# Patient Record
Sex: Female | Born: 1935
Health system: Southern US, Community
[De-identification: ages and names within clinical notes are randomized; demographics above are authoritative.]

## PROBLEM LIST (undated history)

## (undated) DIAGNOSIS — R0989 Other specified symptoms and signs involving the circulatory and respiratory systems: Secondary | ICD-10-CM

## (undated) DIAGNOSIS — R7302 Impaired glucose tolerance (oral): Secondary | ICD-10-CM

## (undated) DIAGNOSIS — H9190 Unspecified hearing loss, unspecified ear: Secondary | ICD-10-CM

## (undated) DIAGNOSIS — R413 Other amnesia: Secondary | ICD-10-CM

## (undated) DIAGNOSIS — J310 Chronic rhinitis: Secondary | ICD-10-CM

## (undated) DIAGNOSIS — D649 Anemia, unspecified: Secondary | ICD-10-CM

## (undated) DIAGNOSIS — K08109 Complete loss of teeth, unspecified cause, unspecified class: Secondary | ICD-10-CM

## (undated) DIAGNOSIS — J309 Allergic rhinitis, unspecified: Secondary | ICD-10-CM

## (undated) DIAGNOSIS — R42 Dizziness and giddiness: Secondary | ICD-10-CM

## (undated) DIAGNOSIS — N289 Disorder of kidney and ureter, unspecified: Secondary | ICD-10-CM

## (undated) DIAGNOSIS — K219 Gastro-esophageal reflux disease without esophagitis: Secondary | ICD-10-CM

## (undated) DIAGNOSIS — Z973 Presence of spectacles and contact lenses: Secondary | ICD-10-CM

## (undated) DIAGNOSIS — I10 Essential (primary) hypertension: Secondary | ICD-10-CM

## (undated) DIAGNOSIS — R0600 Dyspnea, unspecified: Secondary | ICD-10-CM

## (undated) DIAGNOSIS — N189 Chronic kidney disease, unspecified: Secondary | ICD-10-CM

## (undated) DIAGNOSIS — E871 Hypo-osmolality and hyponatremia: Secondary | ICD-10-CM

## (undated) DIAGNOSIS — C50412 Malignant neoplasm of upper-outer quadrant of left female breast: Secondary | ICD-10-CM

## (undated) DIAGNOSIS — Z972 Presence of dental prosthetic device (complete) (partial): Secondary | ICD-10-CM

## (undated) DIAGNOSIS — K635 Polyp of colon: Secondary | ICD-10-CM

## (undated) DIAGNOSIS — I5189 Other ill-defined heart diseases: Secondary | ICD-10-CM

## (undated) DIAGNOSIS — M1990 Unspecified osteoarthritis, unspecified site: Secondary | ICD-10-CM

## (undated) DIAGNOSIS — J45909 Unspecified asthma, uncomplicated: Secondary | ICD-10-CM

## (undated) DIAGNOSIS — M7500 Adhesive capsulitis of unspecified shoulder: Secondary | ICD-10-CM

## (undated) HISTORY — DX: Disorder of kidney and ureter, unspecified: N28.9

## (undated) HISTORY — DX: Polyp of colon: K63.5

## (undated) HISTORY — PX: EYE SURGERY: SHX253

## (undated) HISTORY — DX: Malignant neoplasm of upper-outer quadrant of left female breast: C50.412

## (undated) HISTORY — PX: ABDOMINAL HYSTERECTOMY: SHX81

## (undated) HISTORY — DX: Anemia, unspecified: D64.9

## (undated) HISTORY — DX: Chronic rhinitis: J31.0

## (undated) HISTORY — DX: Unspecified osteoarthritis, unspecified site: M19.90

## (undated) HISTORY — DX: Adhesive capsulitis of unspecified shoulder: M75.00

## (undated) HISTORY — DX: Essential (primary) hypertension: I10

## (undated) HISTORY — DX: Chronic kidney disease, unspecified: N18.9

## (undated) HISTORY — DX: Other ill-defined heart diseases: I51.89

## (undated) HISTORY — PX: TONSILLECTOMY: SUR1361

## (undated) HISTORY — PX: COLONOSCOPY: SHX174

## (undated) HISTORY — DX: Allergic rhinitis, unspecified: J30.9

## (undated) HISTORY — DX: Dizziness and giddiness: R42

## (undated) HISTORY — DX: Morbid (severe) obesity due to excess calories: E66.01

## (undated) HISTORY — PX: KNEE ARTHROSCOPY: SUR90

## (undated) HISTORY — DX: Other amnesia: R41.3

## (undated) HISTORY — DX: Impaired glucose tolerance (oral): R73.02

---

## 1978-10-26 HISTORY — PX: VESICOVAGINAL FISTULA CLOSURE W/ TAH: SUR271

## 2000-03-31 ENCOUNTER — Encounter: Admission: RE | Admit: 2000-03-31 | Discharge: 2000-03-31 | Payer: Self-pay | Admitting: Obstetrics and Gynecology

## 2000-03-31 ENCOUNTER — Encounter: Payer: Self-pay | Admitting: Obstetrics and Gynecology

## 2000-04-07 ENCOUNTER — Other Ambulatory Visit: Admission: RE | Admit: 2000-04-07 | Discharge: 2000-04-07 | Payer: Self-pay | Admitting: Obstetrics and Gynecology

## 2000-10-26 HISTORY — PX: TOTAL KNEE ARTHROPLASTY: SHX125

## 2000-11-16 ENCOUNTER — Encounter: Payer: Self-pay | Admitting: Orthopedic Surgery

## 2000-11-19 ENCOUNTER — Inpatient Hospital Stay (HOSPITAL_COMMUNITY): Admission: RE | Admit: 2000-11-19 | Discharge: 2000-11-24 | Payer: Self-pay | Admitting: Orthopedic Surgery

## 2000-11-19 ENCOUNTER — Encounter: Payer: Self-pay | Admitting: Orthopedic Surgery

## 2000-11-22 ENCOUNTER — Encounter: Payer: Self-pay | Admitting: Orthopedic Surgery

## 2001-04-05 ENCOUNTER — Encounter: Admission: RE | Admit: 2001-04-05 | Discharge: 2001-04-05 | Payer: Self-pay | Admitting: Obstetrics and Gynecology

## 2001-04-05 ENCOUNTER — Encounter: Payer: Self-pay | Admitting: Obstetrics and Gynecology

## 2001-04-14 ENCOUNTER — Other Ambulatory Visit: Admission: RE | Admit: 2001-04-14 | Discharge: 2001-04-14 | Payer: Self-pay | Admitting: Obstetrics and Gynecology

## 2001-10-26 HISTORY — PX: TOTAL KNEE ARTHROPLASTY: SHX125

## 2002-03-10 ENCOUNTER — Encounter: Payer: Self-pay | Admitting: Obstetrics and Gynecology

## 2002-03-10 ENCOUNTER — Encounter: Admission: RE | Admit: 2002-03-10 | Discharge: 2002-03-10 | Payer: Self-pay | Admitting: Obstetrics and Gynecology

## 2002-05-24 ENCOUNTER — Inpatient Hospital Stay (HOSPITAL_COMMUNITY): Admission: RE | Admit: 2002-05-24 | Discharge: 2002-05-28 | Payer: Self-pay | Admitting: Orthopedic Surgery

## 2002-05-24 ENCOUNTER — Encounter: Payer: Self-pay | Admitting: Anesthesiology

## 2003-04-04 ENCOUNTER — Encounter: Payer: Self-pay | Admitting: Obstetrics and Gynecology

## 2003-04-04 ENCOUNTER — Encounter: Admission: RE | Admit: 2003-04-04 | Discharge: 2003-04-04 | Payer: Self-pay | Admitting: Obstetrics and Gynecology

## 2003-05-01 ENCOUNTER — Other Ambulatory Visit: Admission: RE | Admit: 2003-05-01 | Discharge: 2003-05-01 | Payer: Self-pay | Admitting: Obstetrics and Gynecology

## 2004-04-14 ENCOUNTER — Encounter: Admission: RE | Admit: 2004-04-14 | Discharge: 2004-04-14 | Payer: Self-pay | Admitting: Obstetrics and Gynecology

## 2004-09-11 ENCOUNTER — Ambulatory Visit: Payer: Self-pay | Admitting: Pulmonary Disease

## 2005-01-30 ENCOUNTER — Ambulatory Visit: Payer: Self-pay | Admitting: Internal Medicine

## 2005-03-31 ENCOUNTER — Ambulatory Visit: Payer: Self-pay | Admitting: Internal Medicine

## 2005-04-08 ENCOUNTER — Encounter (INDEPENDENT_AMBULATORY_CARE_PROVIDER_SITE_OTHER): Payer: Self-pay | Admitting: *Deleted

## 2005-04-08 ENCOUNTER — Ambulatory Visit: Payer: Self-pay | Admitting: Internal Medicine

## 2005-04-17 ENCOUNTER — Encounter: Admission: RE | Admit: 2005-04-17 | Discharge: 2005-04-17 | Payer: Self-pay | Admitting: Obstetrics and Gynecology

## 2005-05-29 ENCOUNTER — Ambulatory Visit: Payer: Self-pay | Admitting: Internal Medicine

## 2005-08-12 ENCOUNTER — Ambulatory Visit: Payer: Self-pay | Admitting: Internal Medicine

## 2005-09-29 ENCOUNTER — Encounter: Admission: RE | Admit: 2005-09-29 | Discharge: 2005-09-29 | Payer: Self-pay | Admitting: Occupational Medicine

## 2005-11-11 ENCOUNTER — Ambulatory Visit: Payer: Self-pay | Admitting: Pulmonary Disease

## 2005-12-22 ENCOUNTER — Ambulatory Visit: Payer: Self-pay | Admitting: Internal Medicine

## 2006-04-19 ENCOUNTER — Encounter: Admission: RE | Admit: 2006-04-19 | Discharge: 2006-04-19 | Payer: Self-pay | Admitting: Obstetrics and Gynecology

## 2006-04-23 ENCOUNTER — Ambulatory Visit: Payer: Self-pay | Admitting: Internal Medicine

## 2006-09-01 ENCOUNTER — Ambulatory Visit: Payer: Self-pay | Admitting: Internal Medicine

## 2006-10-13 ENCOUNTER — Ambulatory Visit: Payer: Self-pay | Admitting: Internal Medicine

## 2007-02-15 ENCOUNTER — Ambulatory Visit: Payer: Self-pay | Admitting: Internal Medicine

## 2007-04-22 ENCOUNTER — Encounter: Admission: RE | Admit: 2007-04-22 | Discharge: 2007-04-22 | Payer: Self-pay | Admitting: Internal Medicine

## 2007-05-16 ENCOUNTER — Ambulatory Visit: Payer: Self-pay | Admitting: Internal Medicine

## 2007-09-02 ENCOUNTER — Ambulatory Visit: Payer: Self-pay | Admitting: Internal Medicine

## 2007-09-02 LAB — CONVERTED CEMR LAB
BUN: 26 mg/dL — ABNORMAL HIGH (ref 6–23)
Basophils Absolute: 0 10*3/uL (ref 0.0–0.1)
Basophils Relative: 1 % (ref 0.0–1.0)
Bilirubin Urine: NEGATIVE
CO2: 31 meq/L (ref 19–32)
Calcium: 9.8 mg/dL (ref 8.4–10.5)
Chloride: 104 meq/L (ref 96–112)
Cholesterol: 164 mg/dL (ref 0–200)
Creatinine, Ser: 1.2 mg/dL (ref 0.4–1.2)
Crystals: NEGATIVE
Eosinophils Absolute: 0.2 10*3/uL (ref 0.0–0.6)
Eosinophils Relative: 3.7 % (ref 0.0–5.0)
GFR calc Af Amer: 57 mL/min
GFR calc non Af Amer: 47 mL/min
Glucose, Bld: 96 mg/dL (ref 70–99)
HCT: 34.7 % — ABNORMAL LOW (ref 36.0–46.0)
HDL: 50.8 mg/dL (ref >39.0)
Hemoglobin, Urine: NEGATIVE
Hemoglobin: 11.7 g/dL — ABNORMAL LOW (ref 12.0–15.0)
Ketones, ur: NEGATIVE mg/dL
LDL Cholesterol: 101 mg/dL — ABNORMAL HIGH (ref 0–99)
Lymphocytes Relative: 28.6 % (ref 12.0–46.0)
MCHC: 33.7 g/dL (ref 30.0–36.0)
MCV: 94.4 fL (ref 78.0–100.0)
Monocytes Absolute: 0.5 10*3/uL (ref 0.2–0.7)
Monocytes Relative: 9.9 % (ref 3.0–11.0)
Mucus, UA: NEGATIVE
Neutro Abs: 2.8 10*3/uL (ref 1.4–7.7)
Neutrophils Relative %: 56.8 % (ref 43.0–77.0)
Nitrite: NEGATIVE
Platelets: 269 10*3/uL (ref 150–400)
Potassium: 4.3 meq/L (ref 3.5–5.1)
RBC / HPF: NONE SEEN
RBC: 3.68 M/uL — ABNORMAL LOW (ref 3.87–5.11)
RDW: 12.5 % (ref 11.5–14.6)
Sodium: 142 meq/L (ref 135–145)
Specific Gravity, Urine: 1.01 (ref 1.000–1.03)
TSH: 1.91 microintl units/mL (ref 0.35–5.50)
Total CHOL/HDL Ratio: 3.2
Total Protein, Urine: NEGATIVE mg/dL
Triglycerides: 62 mg/dL (ref 0–149)
Urine Glucose: NEGATIVE mg/dL
Urobilinogen, UA: 0.2 (ref 0.0–1.0)
VLDL: 12 mg/dL (ref 0–40)
Vit D, 1,25-Dihydroxy: 22 — ABNORMAL LOW (ref 30–89)
WBC: 4.9 10*3/uL (ref 4.5–10.5)
pH: 7.5 (ref 5.0–8.0)

## 2007-09-14 ENCOUNTER — Encounter: Payer: Self-pay | Admitting: Internal Medicine

## 2007-10-27 HISTORY — PX: CATARACT EXTRACTION: SUR2

## 2007-12-16 DIAGNOSIS — I10 Essential (primary) hypertension: Secondary | ICD-10-CM | POA: Insufficient documentation

## 2007-12-16 DIAGNOSIS — J310 Chronic rhinitis: Secondary | ICD-10-CM | POA: Insufficient documentation

## 2007-12-16 DIAGNOSIS — K625 Hemorrhage of anus and rectum: Secondary | ICD-10-CM | POA: Insufficient documentation

## 2007-12-16 DIAGNOSIS — M1990 Unspecified osteoarthritis, unspecified site: Secondary | ICD-10-CM | POA: Insufficient documentation

## 2007-12-19 ENCOUNTER — Ambulatory Visit: Payer: Self-pay | Admitting: Internal Medicine

## 2007-12-19 DIAGNOSIS — E559 Vitamin D deficiency, unspecified: Secondary | ICD-10-CM | POA: Insufficient documentation

## 2007-12-21 LAB — CONVERTED CEMR LAB: Vit D, 1,25-Dihydroxy: 39 (ref 30–89)

## 2008-04-23 ENCOUNTER — Encounter: Admission: RE | Admit: 2008-04-23 | Discharge: 2008-04-23 | Payer: Self-pay | Admitting: Obstetrics and Gynecology

## 2008-06-18 ENCOUNTER — Telehealth (INDEPENDENT_AMBULATORY_CARE_PROVIDER_SITE_OTHER): Payer: Self-pay | Admitting: *Deleted

## 2008-07-05 ENCOUNTER — Ambulatory Visit: Payer: Self-pay | Admitting: Internal Medicine

## 2008-07-05 DIAGNOSIS — K219 Gastro-esophageal reflux disease without esophagitis: Secondary | ICD-10-CM | POA: Insufficient documentation

## 2008-07-25 ENCOUNTER — Ambulatory Visit: Payer: Self-pay | Admitting: Internal Medicine

## 2008-10-30 ENCOUNTER — Ambulatory Visit: Payer: Self-pay | Admitting: Internal Medicine

## 2008-10-30 DIAGNOSIS — R609 Edema, unspecified: Secondary | ICD-10-CM | POA: Insufficient documentation

## 2008-10-30 LAB — CONVERTED CEMR LAB
ALT: 24 units/L (ref 0–35)
AST: 25 units/L (ref 0–37)
Albumin: 3.8 g/dL (ref 3.5–5.2)
Alkaline Phosphatase: 64 units/L (ref 39–117)
BUN: 26 mg/dL — ABNORMAL HIGH (ref 6–23)
Basophils Absolute: 0.1 10*3/uL (ref 0.0–0.1)
Basophils Relative: 1.1 % (ref 0.0–3.0)
Bilirubin Urine: NEGATIVE
Bilirubin, Direct: 0.1 mg/dL (ref 0.0–0.3)
CO2: 30 meq/L (ref 19–32)
Calcium: 9.5 mg/dL (ref 8.4–10.5)
Chloride: 107 meq/L (ref 96–112)
Cholesterol: 177 mg/dL (ref 0–200)
Creatinine, Ser: 1.4 mg/dL — ABNORMAL HIGH (ref 0.4–1.2)
Crystals: NEGATIVE
Eosinophils Absolute: 0.3 10*3/uL (ref 0.0–0.7)
Eosinophils Relative: 5 % (ref 0.0–5.0)
GFR calc Af Amer: 48 mL/min
GFR calc non Af Amer: 40 mL/min
Glucose, Bld: 101 mg/dL — ABNORMAL HIGH (ref 70–99)
HCT: 34.5 % — ABNORMAL LOW (ref 36.0–46.0)
HDL: 55.8 mg/dL (ref >39.0)
Hemoglobin, Urine: NEGATIVE
Hemoglobin: 11.9 g/dL — ABNORMAL LOW (ref 12.0–15.0)
Ketones, ur: NEGATIVE mg/dL
LDL Cholesterol: 107 mg/dL — ABNORMAL HIGH (ref 0–99)
Lymphocytes Relative: 32.4 % (ref 12.0–46.0)
MCHC: 34.6 g/dL (ref 30.0–36.0)
MCV: 96.2 fL (ref 78.0–100.0)
Monocytes Absolute: 0.5 10*3/uL (ref 0.1–1.0)
Monocytes Relative: 8.6 % (ref 3.0–12.0)
Mucus, UA: NEGATIVE
Neutro Abs: 2.8 10*3/uL (ref 1.4–7.7)
Neutrophils Relative %: 52.9 % (ref 43.0–77.0)
Nitrite: NEGATIVE
Platelets: 242 10*3/uL (ref 150–400)
Potassium: 4.1 meq/L (ref 3.5–5.1)
RBC / HPF: NONE SEEN
RBC: 3.59 M/uL — ABNORMAL LOW (ref 3.87–5.11)
RDW: 12.6 % (ref 11.5–14.6)
Sodium: 142 meq/L (ref 135–145)
Specific Gravity, Urine: <=1.005 (ref 1.000–1.03)
TSH: 2.92 microintl units/mL (ref 0.35–5.50)
Total Bilirubin: 0.9 mg/dL (ref 0.3–1.2)
Total CHOL/HDL Ratio: 3.2
Total Protein, Urine: NEGATIVE mg/dL
Total Protein: 7.2 g/dL (ref 6.0–8.3)
Triglycerides: 71 mg/dL (ref 0–149)
Urine Glucose: NEGATIVE mg/dL
Urobilinogen, UA: 0.2 (ref 0.0–1.0)
VLDL: 14 mg/dL (ref 0–40)
WBC: 5.4 10*3/uL (ref 4.5–10.5)
pH: 6 (ref 5.0–8.0)

## 2008-10-31 LAB — CONVERTED CEMR LAB: Vit D, 1,25-Dihydroxy: 52 (ref 30–89)

## 2008-11-09 ENCOUNTER — Telehealth (INDEPENDENT_AMBULATORY_CARE_PROVIDER_SITE_OTHER): Payer: Self-pay | Admitting: *Deleted

## 2008-12-28 ENCOUNTER — Telehealth: Payer: Self-pay | Admitting: Internal Medicine

## 2009-01-01 ENCOUNTER — Encounter: Payer: Self-pay | Admitting: Adult Health

## 2009-01-01 ENCOUNTER — Ambulatory Visit: Payer: Self-pay | Admitting: Internal Medicine

## 2009-01-01 DIAGNOSIS — M549 Dorsalgia, unspecified: Secondary | ICD-10-CM | POA: Insufficient documentation

## 2009-01-03 LAB — CONVERTED CEMR LAB
Bacteria, UA: NEGATIVE
Bilirubin Urine: NEGATIVE
Crystals: NEGATIVE
Hemoglobin, Urine: NEGATIVE
Ketones, ur: NEGATIVE mg/dL
Mucus, UA: NEGATIVE
Nitrite: NEGATIVE
RBC / HPF: NONE SEEN
Sed Rate: 25 mm/hr — ABNORMAL HIGH (ref 0–22)
Specific Gravity, Urine: 1.01 (ref 1.000–1.035)
Total Protein, Urine: NEGATIVE mg/dL
Urine Glucose: NEGATIVE mg/dL
Urobilinogen, UA: 0.2 (ref 0.0–1.0)
pH: 6.5 (ref 5.0–8.0)

## 2009-01-15 ENCOUNTER — Ambulatory Visit: Payer: Self-pay | Admitting: Internal Medicine

## 2009-05-06 ENCOUNTER — Encounter: Admission: RE | Admit: 2009-05-06 | Discharge: 2009-05-06 | Payer: Self-pay | Admitting: Internal Medicine

## 2009-06-17 ENCOUNTER — Telehealth: Payer: Self-pay | Admitting: Internal Medicine

## 2009-07-08 ENCOUNTER — Telehealth (INDEPENDENT_AMBULATORY_CARE_PROVIDER_SITE_OTHER): Payer: Self-pay | Admitting: *Deleted

## 2009-08-14 ENCOUNTER — Ambulatory Visit: Payer: Self-pay | Admitting: Internal Medicine

## 2009-08-28 ENCOUNTER — Telehealth: Payer: Self-pay | Admitting: Internal Medicine

## 2009-08-29 ENCOUNTER — Ambulatory Visit: Payer: Self-pay | Admitting: Internal Medicine

## 2009-09-30 ENCOUNTER — Ambulatory Visit: Payer: Self-pay | Admitting: Internal Medicine

## 2009-11-18 ENCOUNTER — Ambulatory Visit: Payer: Self-pay | Admitting: Internal Medicine

## 2010-02-12 ENCOUNTER — Ambulatory Visit: Payer: Self-pay | Admitting: Internal Medicine

## 2010-02-12 DIAGNOSIS — N259 Disorder resulting from impaired renal tubular function, unspecified: Secondary | ICD-10-CM | POA: Insufficient documentation

## 2010-02-13 LAB — CONVERTED CEMR LAB: Vit D, 25-Hydroxy: 67 ng/mL (ref 30–89)

## 2010-02-15 LAB — CONVERTED CEMR LAB
ALT: 26 units/L (ref 0–35)
AST: 27 units/L (ref 0–37)
Albumin: 4 g/dL (ref 3.5–5.2)
Alkaline Phosphatase: 54 units/L (ref 39–117)
BUN: 28 mg/dL — ABNORMAL HIGH (ref 6–23)
Basophils Absolute: 0.1 10*3/uL (ref 0.0–0.1)
Basophils Relative: 0.9 % (ref 0.0–3.0)
Bilirubin Urine: NEGATIVE
Bilirubin, Direct: 0.1 mg/dL (ref 0.0–0.3)
CO2: 31 meq/L (ref 19–32)
Calcium: 9.7 mg/dL (ref 8.4–10.5)
Chloride: 106 meq/L (ref 96–112)
Cholesterol: 173 mg/dL (ref 0–200)
Creatinine, Ser: 1.5 mg/dL — ABNORMAL HIGH (ref 0.4–1.2)
Eosinophils Absolute: 0.3 10*3/uL (ref 0.0–0.7)
Eosinophils Relative: 4.6 % (ref 0.0–5.0)
GFR calc non Af Amer: 43.86 mL/min (ref >60)
Glucose, Bld: 95 mg/dL (ref 70–99)
HCT: 34.9 % — ABNORMAL LOW (ref 36.0–46.0)
HDL: 50.3 mg/dL (ref >39.00)
Hemoglobin, Urine: NEGATIVE
Hemoglobin: 11.8 g/dL — ABNORMAL LOW (ref 12.0–15.0)
Ketones, ur: NEGATIVE mg/dL
LDL Cholesterol: 106 mg/dL — ABNORMAL HIGH (ref 0–99)
Lymphocytes Relative: 25.8 % (ref 12.0–46.0)
Lymphs Abs: 1.5 10*3/uL (ref 0.7–4.0)
MCHC: 33.8 g/dL (ref 30.0–36.0)
MCV: 96.3 fL (ref 78.0–100.0)
Monocytes Absolute: 0.5 10*3/uL (ref 0.1–1.0)
Monocytes Relative: 8.2 % (ref 3.0–12.0)
Neutro Abs: 3.5 10*3/uL (ref 1.4–7.7)
Neutrophils Relative %: 60.5 % (ref 43.0–77.0)
Nitrite: NEGATIVE
Platelets: 259 10*3/uL (ref 150.0–400.0)
Potassium: 4.3 meq/L (ref 3.5–5.1)
RBC: 3.62 M/uL — ABNORMAL LOW (ref 3.87–5.11)
RDW: 13.5 % (ref 11.5–14.6)
Sodium: 143 meq/L (ref 135–145)
Specific Gravity, Urine: 1.01 (ref 1.000–1.030)
TSH: 2.53 microintl units/mL (ref 0.35–5.50)
Total Bilirubin: 0.5 mg/dL (ref 0.3–1.2)
Total CHOL/HDL Ratio: 3
Total Protein, Urine: NEGATIVE mg/dL
Total Protein: 7.1 g/dL (ref 6.0–8.3)
Triglycerides: 84 mg/dL (ref 0.0–149.0)
Urine Glucose: NEGATIVE mg/dL
Urobilinogen, UA: 0.2 (ref 0.0–1.0)
VLDL: 16.8 mg/dL (ref 0.0–40.0)
WBC: 5.9 10*3/uL (ref 4.5–10.5)
pH: 7.5 (ref 5.0–8.0)

## 2010-02-17 ENCOUNTER — Telehealth (INDEPENDENT_AMBULATORY_CARE_PROVIDER_SITE_OTHER): Payer: Self-pay | Admitting: *Deleted

## 2010-02-18 ENCOUNTER — Telehealth: Payer: Self-pay | Admitting: Adult Health

## 2010-02-19 ENCOUNTER — Telehealth: Payer: Self-pay | Admitting: Internal Medicine

## 2010-03-31 ENCOUNTER — Encounter: Payer: Self-pay | Admitting: Internal Medicine

## 2010-03-31 ENCOUNTER — Ambulatory Visit: Payer: Self-pay | Admitting: Family Medicine

## 2010-04-11 ENCOUNTER — Encounter (INDEPENDENT_AMBULATORY_CARE_PROVIDER_SITE_OTHER): Payer: Self-pay | Admitting: *Deleted

## 2010-04-18 ENCOUNTER — Telehealth: Payer: Self-pay | Admitting: Internal Medicine

## 2010-05-01 ENCOUNTER — Ambulatory Visit: Payer: Self-pay | Admitting: Pulmonary Disease

## 2010-05-05 LAB — CONVERTED CEMR LAB
BUN: 36 mg/dL — ABNORMAL HIGH (ref 6–23)
CO2: 26 meq/L (ref 19–32)
Calcium: 9.4 mg/dL (ref 8.4–10.5)
Chloride: 102 meq/L (ref 96–112)
Creatinine, Ser: 1.6 mg/dL — ABNORMAL HIGH (ref 0.4–1.2)
GFR calc non Af Amer: 41.9 mL/min (ref >60)
Glucose, Bld: 129 mg/dL — ABNORMAL HIGH (ref 70–99)
Potassium: 4.2 meq/L (ref 3.5–5.1)
Sodium: 139 meq/L (ref 135–145)

## 2010-05-13 ENCOUNTER — Telehealth (INDEPENDENT_AMBULATORY_CARE_PROVIDER_SITE_OTHER): Payer: Self-pay | Admitting: *Deleted

## 2010-06-16 ENCOUNTER — Encounter (INDEPENDENT_AMBULATORY_CARE_PROVIDER_SITE_OTHER): Payer: Self-pay | Admitting: *Deleted

## 2010-06-23 ENCOUNTER — Telehealth (INDEPENDENT_AMBULATORY_CARE_PROVIDER_SITE_OTHER): Payer: Self-pay | Admitting: *Deleted

## 2010-06-24 ENCOUNTER — Encounter: Payer: Self-pay | Admitting: Internal Medicine

## 2010-06-25 ENCOUNTER — Telehealth (INDEPENDENT_AMBULATORY_CARE_PROVIDER_SITE_OTHER): Payer: Self-pay | Admitting: *Deleted

## 2010-06-25 ENCOUNTER — Encounter: Payer: Self-pay | Admitting: Internal Medicine

## 2010-07-01 ENCOUNTER — Encounter: Payer: Self-pay | Admitting: Adult Health

## 2010-07-01 ENCOUNTER — Ambulatory Visit: Payer: Self-pay | Admitting: Internal Medicine

## 2010-07-01 DIAGNOSIS — R413 Other amnesia: Secondary | ICD-10-CM | POA: Insufficient documentation

## 2010-07-01 LAB — CONVERTED CEMR LAB

## 2010-07-04 LAB — CONVERTED CEMR LAB
BUN: 25 mg/dL — ABNORMAL HIGH (ref 6–23)
Basophils Absolute: 0 10*3/uL (ref 0.0–0.1)
Basophils Relative: 0.7 % (ref 0.0–3.0)
CO2: 31 meq/L (ref 19–32)
Calcium: 9.2 mg/dL (ref 8.4–10.5)
Chloride: 102 meq/L (ref 96–112)
Creatinine, Ser: 1.3 mg/dL — ABNORMAL HIGH (ref 0.4–1.2)
Eosinophils Absolute: 0.1 10*3/uL (ref 0.0–0.7)
Eosinophils Relative: 1.7 % (ref 0.0–5.0)
Folate: >20 ng/mL
GFR calc non Af Amer: 52.62 mL/min (ref >60)
Glucose, Bld: 81 mg/dL (ref 70–99)
HCT: 34.5 % — ABNORMAL LOW (ref 36.0–46.0)
Hemoglobin: 11.8 g/dL — ABNORMAL LOW (ref 12.0–15.0)
Lymphocytes Relative: 30.4 % (ref 12.0–46.0)
Lymphs Abs: 1.7 10*3/uL (ref 0.7–4.0)
MCHC: 34.1 g/dL (ref 30.0–36.0)
MCV: 97.9 fL (ref 78.0–100.0)
Monocytes Absolute: 0.4 10*3/uL (ref 0.1–1.0)
Monocytes Relative: 7.3 % (ref 3.0–12.0)
Neutro Abs: 3.3 10*3/uL (ref 1.4–7.7)
Neutrophils Relative %: 59.9 % (ref 43.0–77.0)
Platelets: 245 10*3/uL (ref 150.0–400.0)
Potassium: 4.3 meq/L (ref 3.5–5.1)
RBC: 3.52 M/uL — ABNORMAL LOW (ref 3.87–5.11)
RDW: 13 % (ref 11.5–14.6)
Sodium: 141 meq/L (ref 135–145)
Vitamin B-12: 683 pg/mL (ref 211–911)
WBC: 5.4 10*3/uL (ref 4.5–10.5)

## 2010-07-15 ENCOUNTER — Encounter (INDEPENDENT_AMBULATORY_CARE_PROVIDER_SITE_OTHER): Payer: Self-pay | Admitting: *Deleted

## 2010-07-16 ENCOUNTER — Ambulatory Visit: Payer: Self-pay | Admitting: Internal Medicine

## 2010-07-25 ENCOUNTER — Ambulatory Visit: Payer: Self-pay

## 2010-07-25 ENCOUNTER — Encounter: Payer: Self-pay | Admitting: Internal Medicine

## 2010-07-30 ENCOUNTER — Ambulatory Visit: Payer: Self-pay | Admitting: Internal Medicine

## 2010-10-24 ENCOUNTER — Ambulatory Visit: Payer: Self-pay | Admitting: Internal Medicine

## 2010-10-24 DIAGNOSIS — R5381 Other malaise: Secondary | ICD-10-CM | POA: Insufficient documentation

## 2010-10-24 DIAGNOSIS — R3915 Urgency of urination: Secondary | ICD-10-CM | POA: Insufficient documentation

## 2010-10-24 DIAGNOSIS — R5383 Other fatigue: Secondary | ICD-10-CM | POA: Insufficient documentation

## 2010-10-25 ENCOUNTER — Encounter: Payer: Self-pay | Admitting: Adult Health

## 2010-10-28 LAB — CONVERTED CEMR LAB
BUN: 34 mg/dL — ABNORMAL HIGH (ref 6–23)
Basophils Absolute: 0 10*3/uL (ref 0.0–0.1)
Basophils Relative: 0.8 % (ref 0.0–3.0)
Bilirubin Urine: NEGATIVE
CO2: 30 meq/L (ref 19–32)
Calcium: 9.6 mg/dL (ref 8.4–10.5)
Chloride: 102 meq/L (ref 96–112)
Creatinine, Ser: 1.6 mg/dL — ABNORMAL HIGH (ref 0.4–1.2)
Eosinophils Absolute: 0.1 10*3/uL (ref 0.0–0.7)
Eosinophils Relative: 2.1 % (ref 0.0–5.0)
Folate: >20 ng/mL
GFR calc non Af Amer: 42.15 mL/min — ABNORMAL LOW (ref >60.00)
Glucose, Bld: 81 mg/dL (ref 70–99)
HCT: 32.7 % — ABNORMAL LOW (ref 36.0–46.0)
Hemoglobin: 11.2 g/dL — ABNORMAL LOW (ref 12.0–15.0)
Iron: 70 ug/dL (ref 42–145)
Ketones, ur: NEGATIVE mg/dL
Lymphocytes Relative: 27 % (ref 12.0–46.0)
Lymphs Abs: 1.5 10*3/uL (ref 0.7–4.0)
MCHC: 34.3 g/dL (ref 30.0–36.0)
MCV: 98.6 fL (ref 78.0–100.0)
Monocytes Absolute: 0.4 10*3/uL (ref 0.1–1.0)
Monocytes Relative: 7.4 % (ref 3.0–12.0)
Neutro Abs: 3.6 10*3/uL (ref 1.4–7.7)
Neutrophils Relative %: 62.7 % (ref 43.0–77.0)
Nitrite: NEGATIVE
Platelets: 235 10*3/uL (ref 150.0–400.0)
Potassium: 4.2 meq/L (ref 3.5–5.1)
RBC: 3.32 M/uL — ABNORMAL LOW (ref 3.87–5.11)
RDW: 13 % (ref 11.5–14.6)
Saturation Ratios: 17.8 % — ABNORMAL LOW (ref 20.0–50.0)
Sodium: 140 meq/L (ref 135–145)
Specific Gravity, Urine: <=1.005 (ref 1.000–1.030)
Total Protein, Urine: NEGATIVE mg/dL
Transferrin: 280.6 mg/dL (ref 212.0–360.0)
Urine Glucose: NEGATIVE mg/dL
Urobilinogen, UA: 0.2 (ref 0.0–1.0)
Vitamin B-12: 581 pg/mL (ref 211–911)
WBC: 5.7 10*3/uL (ref 4.5–10.5)
pH: 6 (ref 5.0–8.0)

## 2010-10-30 ENCOUNTER — Other Ambulatory Visit: Payer: Self-pay | Admitting: Internal Medicine

## 2010-10-30 ENCOUNTER — Ambulatory Visit
Admission: RE | Admit: 2010-10-30 | Discharge: 2010-10-30 | Payer: Self-pay | Source: Home / Self Care | Attending: Internal Medicine | Admitting: Internal Medicine

## 2010-10-30 LAB — URINALYSIS, ROUTINE W REFLEX MICROSCOPIC
Bilirubin Urine: NEGATIVE
Hemoglobin, Urine: NEGATIVE
Ketones, ur: NEGATIVE
Nitrite: NEGATIVE
Specific Gravity, Urine: <=1.005 (ref 1.000–1.030)
Total Protein, Urine: NEGATIVE
Urine Glucose: NEGATIVE
Urobilinogen, UA: 0.2 (ref 0.0–1.0)
pH: 6.5 (ref 5.0–8.0)

## 2010-11-25 NOTE — Assessment & Plan Note (Signed)
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  wonders if was caused by excess fatigue.  symptoms have not returned.[name]  History of Present Illness: 75  yobf never smoker  with morbid obesity complicated by hypertension and tendency to fluid retention controlled on spironolactone and hydrochlorothiazide daily.  Hx of intermittent leg swelling with negative venous Dopplers in the past.  Labs 11/08 showed low vitamin d level at 20, started on vitamin D 50k qwk, vit d level normalized  06/26/08 ov no new co's, leg swelling better. occ HB without dysphagia.  October 30, 2008 cpx co increaese in left foot  swelling when standing alot otherwise no co's. Podiatrist want to operate on it. no pain,   walking ok.   No tia, claudiation symptoms or cp/sob  January 01, 2009-r work in visit c/o  bilat hip pain toward the buttock area, also c/o increased edema in lower left leg and a "crawling" sensation in the right groin area.  d as chronic venous insuff. prev ven dopp neg.    01/15/09 ov no better, pain both hips but worse on right rad to post right without numbness, no back pain or change in bowel or bladder habits. no fever. rec ortho eval rec muphy/wainer and rec short cycle of prednisone.  Prednsione did help transiently Ended up seeing Dr Evelina Bucy and Maureen Ralphs separately,  rec aleve/ tylenol not effective - no f/u pain   November  4, Followup for tramadol refill.  Pt states that she needs this med for arthritis pain in back.     September 30, 2009 --Presents for follow up and med review. Pt brought all her meds today, we updated her med calendar.   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 When it came time for her to say her part she could not remember her lines. She states after a minute or so she remembered and said her lines. Pt is  concerned about this and watned to let MW know what happened.    November 18, 2009 6 wk followup. 75  Pt states that she is doing well and denies any complaints today. Needing tramadol and all her prns less using med calendar consistently.  PAGE 2 >>>>>>>>>>>>>>>February 12, 2010  cpx no new c/os.   --labs and bmd reviewed. Aleve changed to Sulindac.   May 01, 2010-Presents for work in visit. Complains that ears have felt "stopped up" x3weeks. More allergy symptoms this summer. Nose feels stopped up/stuffy.  Has had a few episodes of lightheadedness. Ears are stopped up, used sweet oil in ears but did not help. Has been doing alot of yard work. Last visit aleve changed to sulindac. Will need bmet -she has renal insufficiency w/ last scr at 1.5. Sulindac does not help.  Denies chest pain, dyspnea, orthopnea, hemoptysis, fever, n/v/d, edema, headache, visual/speech changes.   July 01, 2010--Returns for follow up and memory evaluation and labs. Last visit renal insufficiency w/ sCr 1.5>1.6 , sulindac changed to as needed. Will check bmet today.  Voices concerns about incident that happened 2 weeks ago. Says she was involved in a play at church but had episode that lasted for  ~1 minute she forgot all her lines. She does not believe she was nervous. But lost her  train of thought, she felt weird. She heard everyone talking, she laughed and found her paper w/ lines on it and all of sudden remembered her lines. Says she was very tired.  Had been working very hard 2 weeks prior to play, she was feeling a large baking order and was very tired. She does have a  Family hx of dementia---father had dementa--passed at 57 ? onset . We completed MMSE and clock draw which were normal today. Labs done earlier this year were essentially unremarkable except for renal fxn as mentioned before. Denies chest pain, dyspnea, orthopnea, hemoptysis, fever, n/v/d, edema, headache, visual/speech changes. during her episode w/ no visual  changes, dysphagia, extermity weakness, speech changes.   Medications Prior to Update: 1)  Centrum Silver  Tabs (Multiple Vitamins-Minerals) .... Take 1 Tablet By Mouth Once A Day 2)  Glucosamine-Chondroitin 500-400 Mg  Tabs (Glucosamine-Chondroitin) .... Take 2 Tablets By Mouth Once A Day 3)  Calcium Carbonate-Vitamin D 600-400 Mg-Unit  Tabs (Calcium Carbonate-Vitamin D) .... 2 Tabs By Mouth Once Daily 4)  Vitamin D3 2000 Unit Caps (Cholecalciferol) .... Take 1 Capsule By Mouth Once A Day 5)  Hydrochlorothiazide 25 Mg Tabs (Hydrochlorothiazide) .... Take 1 Tablet By Mouth Once A Day 6)  Spironolactone 25 Mg Tabs (Spironolactone) .... 2 Tabs By Mouth Once Daily 7)  Bayer Aspirin Ec Low Dose 81 Mg  Tbec (Aspirin) .... Take 1 Tablet By Mouth Once A Day 8)  Fish Oil 1000 Mg Caps (Omega-3 Fatty Acids) .... Take 1 Capsule By Mouth Once A Day 9)  Sulindac 200 Mg Tabs (Sulindac) .... Take 1 Tablet By Mouth Two Times A Day With Meals As Needed For Joint Pain 10)  Tramadol Hcl 50 Mg Tabs (Tramadol Hcl) .[name].. 1 By Mouth Every 4 Hr As Needed Pain 11)  Metamucil Smooth Texture 63 % Powd (Psyllium) .[name].. 1 Tablespoon At Bedtime As Needed  Current Medications (verified): 1)  Centrum Silver  Tabs (Multiple Vitamins-Minerals) .... Take 1 Tablet By Mouth Once A Day 2)  Glucosamine-Chondroitin 500-400 Mg  Tabs (Glucosamine-Chondroitin) .... Take 2 Tablets By Mouth Once A Day 3)  Calcium Carbonate-Vitamin D 600-400 Mg-Unit  Tabs (Calcium Carbonate-Vitamin D) .... 2 Tabs By Mouth Once Daily 4)  Vitamin D3 2000 Unit Caps (Cholecalciferol) .... Take 1 Capsule By Mouth Once A Day 5)  Hydrochlorothiazide 25 Mg Tabs (Hydrochlorothiazide) .... Take 1 Tablet By Mouth Once A Day 6)  Spironolactone 25 Mg Tabs (Spironolactone) .... 2 Tabs By Mouth Once Daily 7)  Bayer Aspirin Ec Low Dose 81 Mg  Tbec (Aspirin) .... Take 1 Tablet By Mouth Once A Day 8)  Fish Oil 1000 Mg Caps (Omega-3 Fatty Acids) .... Take 1 Capsule By Mouth  Once A Day 9)  Metamucil Smooth Texture 63 % Powd (Psyllium) .[name].. 1 Tablespoon At Bedtime As Needed  Allergies (verified): 1)  ! Iron  Past History:  Past Surgical History: Last updated: 10/30/2008 Cataract durgery (rt eye) 2009 Hysterectomy 1980 Right TKR 2002 Left TKR 2003  Family History: Last updated: 10/30/2008  Positive for heart disease in remote members of her family only.  No breast or colon cancer in her family to her knowledge.  Her brother was a smoker had lung cancer.   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  [employer] parttime Never smoker No ETOH  Risk Factors: Alcohol Use: 0 (02/12/2010)  Risk Factors: Smoking Status: never (02/12/2010)  Past Medical History: Colon polyps     - colonoscopy 04/08/2005 CHRONIC RHINITIS (ICD-472.0)     - tried immunotherapy 9/00 MORBID OBESITY (ICD-278.01)    - Target wt  =  163 for BMI < 30  DEGENERATIVE JOINT DISEASE (ICD-715.90).....................Marland KitchenMarland KitchenAlusio/ [facility] HYPERTENSION (ICD-401.9) CHRONIC RENAL INSUFFICIENCY     - Creat 1.5 February 12, 2010 , >1.3 July 02, 2010 (off NSAIDS)  HEALTH MAINTENANCE.........................................................Marland KitchenWert     - dt 11/08      - Pneumovax 11/04 age 67      - CPX February 12, 2010  GYN Care.................................................................................[facility]        - Bone density/mammography per gyn        -BMD 2011-nml  MEMORY LOSS        -MMSE July 01, 2010 >>30/30         -Clock Draw July 01, 2010 >>nml         -RPR -neg, B12/Folate nml  Review of Systems      See HPI  Vital Signs:  Patient profile:   75 year old female Height:      62.25 inches Weight:      165.25 pounds BMI:     30.09 O2 Sat:      96 % on Room air Temp:     97.8 degrees F oral BP sitting:   158 / 80  (left arm) Cuff size:    regular  Vitals Entered By: Parke Poisson CNA/MA (July 01, 2010 11:10 AM)  O2 Flow:  Room air CC: states had some slight memory loss 10days ago while doing a play at church - forgot her lines.  wonders if was caused by excess fatigue.  symptoms have not returned. Is Patient Diabetic? No Comments Medications reviewed with patient Daytime contact number verified with patient. Parke Poisson CNA/MA  July 01, 2010 11:11 AM    Physical Exam  Additional Exam:  wt  165 October 30, 2008 >> 165 January 01, 2009 >165 September 30, 2009 > 168 November 18, 2009 > 165 February 12, 2010 >>168 May 01, 2010 >>165 July 01, 2010 Ambulatory healthy appearing in no acute distress,  HEENT: edentulous , pale nasal mucosa.  TM nml, EAC clear.  Neck without JVD/Nodes/TM Lungs clear to A and P bilaterally without cough on insp or exp maneuvers RRR no s3 or murmur or increase in P2 Abd soft and benign with nl excursion in the supine position. No bruits or organomegaly Ext warm without calf tenderness, cyanosis clubbing  Skin warm and dry without lesions   MS  pulses sym and intact, equal strength,   nml gait. , arthritic changes in hands.  Neuro Intact w/ no focal deficits noted. equal strength bilaterally.   MMSE : 30/30  Clock Draw: nml   Impression & Recommendations:  Problem # 1:  MEMORY LOSS (ICD-780.93) Isolated incidence of memory loss - She tested nml on MMSE and clock draw. No other suspicious episodes are noted. She was under some pressure and was fatigued w/ work and church obligations- may be contributed to this. Does have family hx of dementia in father.  Labs w/ neg RPR and nml b12 and folate.  will rpeat MMSE in 6 months for comparison. pt to call sooner if other episodes arise.  family -daughter is aware of episode to monitor pt memory and call if needed.  advised on rest and brain teasers.  Orders: T-RPR (Syphilis) [phone]) TLB-CBC  Platelet - w/Differential  (85025-CBCD) TLB-B12 + Folate Pnl [id number]) Est. Patient Level IV [id number])  Problem # 2:  RENAL INSUFFICIENCY (ICD-588.9) improved on NSAIDS.  use cautiously in future  Orders: TLB-BMP (Basic Metabolic Panel-BMET) ([id number]) Est. Patient Level IV [id number])  Complete Medication List: 1)  Centrum Silver Tabs (Multiple vitamins-minerals) .... Take 1 tablet by mouth once a day 2)  Glucosamine-chondroitin 500-400 Mg Tabs (Glucosamine-chondroitin) .... Take 2 tablets by mouth once a day 3)  Calcium Carbonate-vitamin D 600-400 Mg-unit Tabs (Calcium carbonate-vitamin d) .... 2 tabs by mouth once daily 4)  Vitamin D3 2000 Unit Caps (Cholecalciferol) .... Take 1 capsule by mouth once a day 5)  Hydrochlorothiazide 25 Mg Tabs (Hydrochlorothiazide) .... Take 1 tablet by mouth once a day 6)  Spironolactone 25 Mg Tabs (Spironolactone) .... 2 tabs by mouth once daily 7)  Bayer Aspirin Ec Low Dose 81 Mg Tbec (Aspirin) .... Take 1 tablet by mouth once a day 8)  Fish Oil 1000 Mg Caps (Omega-3 fatty acids) .... Take 1 capsule by mouth once a day 9)  Metamucil Smooth Texture 63 % Powd (Psyllium) .[name].. 1 tablespoon at bedtime as needed  Other Orders: Flu Vaccine 43yrs + MEDICARE PATIENTS [id number]) Administration Flu vaccine - MCR [id number])  Patient Instructions: 1)  Brain Teasers--Reading, papers, crossword puzzles, lists, calendars.  2)  Get plenty of rest  3)  I will call with lab results.  4)  follow up 6 months for memory test  5)  Please contact office for sooner follow up if symptoms do not improve or worsen     Flu Vaccine Consent Questions     Do you have a history of severe allergic reactions to this vaccine? no    Any prior history of allergic reactions to egg and/or gelatin? no    Do you have a sensitivity to the preservative Thimersol? no    Do you have a past history of Guillan-Barre Syndrome? no    Do you currently have an acute febrile illness? no    Have you ever had a  severe reaction to latex? no    Vaccine information given and explained to patient? yes    Are you currently pregnant? no    Lot Number:[id number]   Exp Date:04/25/2011   Site Given  Left Deltoid IMflu  Donita Brooks RN  July 01, 2010 11:51 AM

## 2010-11-25 NOTE — Letter (Signed)
Summary: MMSE   MMSE   Imported By: Rise Patience 07/08/2010 15:45:15  _____________________________________________________________________  External Attachment:    Type:   Image     Comment:   External Document

## 2010-11-25 NOTE — Assessment & Plan Note (Signed)
Summary: pain in hips-down/apc   Primary Provider/Referring Provider:  [name]  CC:  bilat hip pain toward the buttock area, also c/o increased edema in lower left leg and a "crawling" sensation in the right groin area.  denies change in color of urine, and odor or pain with urination.  History of Present Illness: 75 yobf never smoker  with morbid obesity complicated by hypertension and tendency to fluid retention controlled on spironolactone and hydrochlorothiazide daily.  Hx of intermittent leg swelling with negative venous Dopplers in the past.  Labs 11/08 showed low vitamin d level at 20, started on vitamin D 50k qwk, vit d level normalized  06/26/08 ov no new co's, leg swelling better. occ HB without dysphagia.  October 30, 2008 cpx co increaese in left foot  swelling when standing alot otherwise no co's. Podiatrist want to operate on it. no pain,   walking ok.   No tia, claudiation symptoms or cp/sob  January 01, 2009--Presents for work in visit. Complains of bilat hip pain toward the buttock area, also c/o increased edema in lower left leg and a "crawling" sensation in the right groin area.  denies change in color of urine, odor or pain with urination, ext weakness, chest pain, dyspnea. Has chronic venous insuff. prev ven dopp neg. no leg redness, or fever. meds not helping. =aleve/robaxin. Only taking every few days. Has had symptoms since 2 weeks, no known injury.   Current Medications (verified): 1)  Super Multiple   Caps (Multiple Vitamins-Minerals) .... Take 1 Tablet By Mouth Once A Day 2)  Glucosamine-Chondroitin 500-400 Mg  Tabs (Glucosamine-Chondroitin) .... Take 1 Tablet By Mouth Once A Day 3)  Calcium-Magnesium 300-300 Mg  Tabs (Calcium-Magnesium) .... 2 Tabs By Mouth Once Daily 4)  Hydrochlorothiazide 25 Mg Tabs (Hydrochlorothiazide) .... Take 1 Tablet By Mouth Once A Day and 1 Extra As Needed 5)  Spironolactone 25 Mg Tabs (Spironolactone) .... 2 Tabs By Mouth Once Daily 6)  Bayer  Aspirin Ec Low Dose 81 Mg  Tbec (Aspirin) .... Take 1 Tablet By Mouth Once A Day 7)  Claritin 10 Mg  Tabs (Loratadine) .... Take 1 Tablet By Mouth Once A Day As Needed 8)  Aleve 220 Mg  Tabs (Naproxen Sodium) .... Per Bottle 9)  Quinine .... 260mg  1 At Bedtime As Needed 10)  Delsym 30 Mg/45ml  Lqcr (Dextromethorphan Polistirex) .... 2 Tsp Two Times A Day As Needed 11)  Robaxin 500 Mg  Tabs (Methocarbamol) .[name].. 1 Tab Every 8 Hours As Needed 12)  Metamucil 48.57 % Powd (Psyllium) .[name].. 1 Tablespoon At Bedtime 13)  Flax Seeds  Powd (Flaxseed (Linseed)) .... 2 Tablespoons Once Daily 14)  Hydrochlorothiazide 25 Mg Tabs (Hydrochlorothiazide) .[name].. 1 Extra Daily As Needed 15)  Maalox Plus 225-200-25 Mg/62ml Susp (Alum & Mag Hydroxide-Simeth) .... As Needed For Heart Burn  Allergies (verified): 1)  ! Iron  Past History  Past Medical History: Colon polyps     - colonoscopy 04/08/2005 CHRONIC RHINITIS (ICD-472.0)     - tried immunotherapy 9/00 MORBID OBESITY (ICD-278.01)    - Target wt  =  163 for BMI < 30  DEGENERATIVE JOINT DISEASE (ICD-715.90) HYPERTENSION (ICD-401.9)  [place] 11/08      - Pneumovax 11/04      - CPX October 30, 2008  GYN Care.................................................................................[facility]       - Bone density/mammography per gyn (10/30/2008)  Past Surgical History: Cataract durgery (rt eye) 2009 Hysterectomy  1980 Right TKR 2002 Left TKR 2003  (10/30/2008)  Family History:  Positive for heart disease in remote members of her family only.  No breast or colon cancer in her family to her knowledge.  Her brother was a smoker had lung cancer.  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  [employer] parttime Never smoker No ETOH  (10/30/2008)  Risk Factors: Alcohol Use: N/A >5 drinks/d w/in last 3 months: N/A Caffeine Use: N/A Diet:  N/A Exercise: N/A  Risk Factors: Smoking Status: never (07/05/2008) Packs/Day: N/A Cigars/wk: N/A Pipe Use/wk: N/A Cans of tobacco/wk: N/A Passive Smoke Exposure: N/A   Vital Signs:  Patient profile:   75 year old female Height:      62.5 inches Weight:      165.44 pounds BMI:     29.88 O2 Sat:      98 % Temp:     98.1 degrees F oral Pulse rate:   74 / minute BP sitting:   122 / 74  (left arm) Cuff size:   regular  Vitals Entered By: Parke Poisson CNA (January 01, 2009 10:15 AM)  O2 Flow:  room air CC: bilat hip pain toward the buttock area, also c/o increased edema in lower left leg and a "crawling" sensation in the right groin area.  denies change in color of urine, odor or pain with urination Is Patient Diabetic? Yes  Pain Assessment Patient in pain? yes     Location: hips and tailbone Intensity: 8 Type: stinging Onset of pain  Sudden Comments Medications reviewed with patient Parke Poisson CNA  January 01, 2009 10:16 AM    Physical Exam  Additional Exam:  wt 167 > 165 October 30, 2008 >>165 January 01, 2009 Ambulatory healthy appearing in no acute distress. Afeb with normal vital signs HEENT: nl dentition, turbinates, and orophanx. Nl external ear canals without cough reflex Neck without JVD/Nodes/TM Lungs clear to A and P bilaterally without cough on insp or exp maneuvers RRR no s3 or murmur or increase in P2 Abd soft and benign with nl excursion in the supine position. No bruits or organomegaly Ext warm without calf tenderness, cyanosis clubbing or tr-1+edema l>r, neg homan sign Skin warm and dry without lesions   MS   , pulses sym and intact, tenderness along lumbar/sacral, equal strenght, no rash, neg slr, nml gait, neg cva tenderness.    Pulmonary Function Test Height (in.): 62.5 Gender: Female  Impression & Recommendations:  Problem # 1:  DEGENERATIVE JOINT DISEASE (ICD-715.90)  Suspect lumbarsacral strain. has hx of djd, now also mild pain in  shoulder, check esr to r/o PMR check ua to r/o uti REC: Aleve two times a day for 5 days then as needed for back/joint pain--take w/ food Robaxin two times a day for 5 days, then as needed muscle spam Alternate ice and heat.  Tramadol 50mg  every 6 hr as needed pain.  Please contact office for sooner follow up if symptoms do not improve or worsen  follow up 2 weeks  Her updated medication list for this problem includes:    Bayer Aspirin Ec Low Dose 81 Mg Tbec (Aspirin) .[name]... Take 1 tablet by mouth once a day    Aleve 220 Mg Tabs (Naproxen sodium) .[name]... Per bottle    Tramadol Hcl 50 Mg Tabs (Tramadol hcl) .[name]... 1 by mouth every 6 hr as needed pain  Orders: Est.  Patient Level IV [id number])  Discussed use of medications, application of heat or cold, and exercises.   Medications Added to Medication List This Visit: 1)  Metamucil 48.57 % Powd (Psyllium) .[name].. 1 tablespoon at bedtime 2)  Flax Seeds Powd (Flaxseed (linseed)) .... 2 tablespoons once daily 3)  Hydrochlorothiazide 25 Mg Tabs (Hydrochlorothiazide) .[name].. 1 extra daily as needed 4)  Maalox Plus 225-200-25 Mg/87ml Susp (Alum & mag hydroxide-simeth) .... As needed for heart burn 5)  Tramadol Hcl 50 Mg Tabs (Tramadol hcl) .[name].. 1 by mouth every 6 hr as needed pain  Complete Medication List: 1)  Super Multiple Caps (Multiple vitamins-minerals) .... Take 1 tablet by mouth once a day 2)  Glucosamine-chondroitin 500-400 Mg Tabs (Glucosamine-chondroitin) .... Take 1 tablet by mouth once a day 3)  Calcium-magnesium 300-300 Mg Tabs (Calcium-magnesium) .... 2 tabs by mouth once daily 4)  Hydrochlorothiazide 25 Mg Tabs (Hydrochlorothiazide) .... Take 1 tablet by mouth once a day and 1 extra as needed 5)  Spironolactone 25 Mg Tabs (Spironolactone) .... 2 tabs by mouth once daily 6)  Bayer Aspirin Ec Low Dose 81 Mg Tbec (Aspirin) .... Take 1 tablet by mouth once a day 7)  Claritin 10 Mg Tabs (Loratadine) .... Take 1 tablet by mouth once a day as  needed 8)  Aleve 220 Mg Tabs (Naproxen sodium) .... Per bottle 9)  Quinine  .... 260mg  1 at bedtime as needed 10)  Delsym 30 Mg/61ml Lqcr (Dextromethorphan polistirex) .... 2 tsp two times a day as needed 11)  Robaxin 500 Mg Tabs (Methocarbamol) .[name].. 1 tab every 8 hours as needed 12)  Metamucil 48.57 % Powd (Psyllium) .[name].. 1 tablespoon at bedtime 13)  Flax Seeds Powd (Flaxseed (linseed)) .... 2 tablespoons once daily 14)  Hydrochlorothiazide 25 Mg Tabs (Hydrochlorothiazide) .[name].. 1 extra daily as needed 15)  Maalox Plus 225-200-25 Mg/39ml Susp (Alum & mag hydroxide-simeth) .... As needed for heart burn 16)  Tramadol Hcl 50 Mg Tabs (Tramadol hcl) .[name].. 1 by mouth every 6 hr as needed pain  Other Orders: TLB-Udip w/ Micro (81001-URINE) T-Urine Culture (Spectrum Order) [id number]) TLB-Sedimentation Rate (ESR) (85652-ESR)  Patient Instructions: 1)  Aleve two times a day for 5 days then as needed for back/joint pain--take w/ food 2)  Robaxin two times a day for 5 days, then as needed muscle spam 3)  Alternate ice and heat.  4)  Tramadol 50mg  every 6 hr as needed pain.  5)  Please contact office for sooner follow up if symptoms do not improve or worsen  6)  follow up 2 weeks  7)  The medication list was reviewed and reconciled.  All changed / newly prescribed medications were explained.  A complete medication list was provided to the patient / caregiver.  Prescriptions: TRAMADOL HCL 50 MG TABS (TRAMADOL HCL) 1 by mouth every 6 hr as needed pain  #30 x 0   Entered and Authorized by:   Rexene Edison NP   Signed by:   Rexene Edison NP on 01/01/2009   Method used:   Electronically to        [facility]  [phone]* (retail)       [address]       [place], [place]  [address]       Ph: [phone] or [phone]       Fax: [phone]   RxID:   [id number]

## 2010-11-25 NOTE — Progress Notes (Signed)
Summary: memory loss - MMSE 9.6.11  Phone Note Call from Patient   Caller: Patient Call For: wert Summary of Call: pt is concern about memory loss she experinced this weekend. Initial call taken by: Gustavus Bryant,  June 25, 2010 1:53 PM  Follow-up for Phone Call        last ov w/ MW 02-12-10, last ov w/ TP 05-01-10.  LMOM TCB. 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 When it came time for her to say her part she could not remember her lines. She states after a minute or so she remembered and said her lines. Pt is concerned about this and watned to let MW know what happened. Pelase advise. Willacy Bing CMA  June 26, 2010 11:29 AM not sure but needs eval, see Tammy asap with MMSE baseline  and neuro referral if warranted Follow-up by: Tanda Rockers MD,  June 26, 2010 11:31 AM  Additional Follow-up for Phone Call Additional follow up Details #1::        LMTCB to schedule memory eval  appt with TP. [place] Bing CMA  June 26, 2010 11:43 AM  [facility] Raymondo Band RN  June 26, 2010 3:21 PM  called spoke with patient.  advised of MWs recs of MMSE.  TP not in office today; office closed Monday for Labor Day.  MMSE scheduled w/ TP for 9.6.11 @ 1100.  pt okay with this date and time.   Additional Follow-up by: Parke Poisson CNA/MA,  June 27, 2010 9:14 AM

## 2010-11-25 NOTE — Progress Notes (Signed)
Summary: pain  Phone Note Call from Patient Call back at Home Phone [phone]   Caller: Patient Call For: Elmus Mathes Reason for Call: Acute Illness, Talk to Nurse Summary of Call: pain, records show arthritis.  x2 weeks on the pain inher thighs and legs.  Swelling in left leg, painful.  Has had both knees replaced.  Kammi Hechler told her to call him before seeing neurolgy.  Getting worse. Initial call taken by: Zigmund Gottron,  December 28, 2008 4:57 PM  Follow-up for Phone Call        just noticed swelling in her left leg today---severe pain in bil thighs---this pain is not relieved with aleve---she is in alot of pain and normally does not complain---she really does not want to go to urgent care but if she has too then she will.  pain is also in her hips.  please advise.  thanks. Ramiro Harvest CMA  December 28, 2008 5:04 PM  not taking hctz as instructed, repeated prev rec to take 1 automatically and 1 extra if  needed for leg swelling Follow-up by: Tanda Rockers MD,  December 28, 2008 5:48 PM

## 2010-11-25 NOTE — Progress Notes (Signed)
Summary: refill-LMTCB  Phone Note Call from Patient Call back at Home Phone [phone]   Caller: Patient Call For: Gennavieve Huq Summary of Call: pt called in rx yesterday, pharmacy told her she needed to call her doctor before they could refill it. Initial call taken by: [name],  August 28, 2009 2:30 PM  Follow-up for Phone Call        [facility]. Anton Bing CMA  August 28, 2009 2:51 PM   Additional Follow-up for Phone Call Additional follow up Details #1::        Pt scheduled to see MW tomorrow @ 10:20am. Pt aware. Iran Planas CMA  August 28, 2009 4:48 PM

## 2010-11-25 NOTE — Letter (Signed)
Summary: Colonoscopy Letter  [facility]  [place], [place] [address]   Phone: [phone]  Fax: [phone]      April 11, 2010 MRN: [id number]   [facility] [address] [place], [place]  [address]   Dear Ms. Nelson,   According to your medical record, it is time for you to schedule a Colonoscopy. The American Cancer Society recommends this procedure as a method to detect early colon cancer. Patients with a family history of colon cancer, or a personal history of colon polyps or inflammatory bowel disease are at increased risk.  This letter has beeen generated based on the recommendations made at the time of your procedure. If you feel that in your particular situation this may no longer apply, please contact our office.  Please call our office at [phone] to schedule this appointment or to update your records at your earliest convenience.  Thank you for cooperating with [place] to provide you with the very best care possible.   Sincerely,  Docia Chuck. Henrene Pastor, M.D.  [facility] Gastroenterology Division [phone]

## 2010-11-25 NOTE — Miscellaneous (Signed)
Summary: previsit prep/rm  Clinical Lists Changes  Medications: Added new medication of MOVIPREP 100 GM  SOLR (PEG-KCL-NACL-NASULF-NA ASC-C) As per prep instructions. - Signed Rx of MOVIPREP 100 GM  SOLR (PEG-KCL-NACL-NASULF-NA ASC-C) As per prep instructions.;  #1 x 0;  Signed;  Entered by: Sundra Aland RN;  Authorized by: Irene Shipper MD;  Method used: Electronically to [facility]  [phone]*, [address], [place], [place], [place]  [address], Ph: [phone] or [phone], Fax: [phone] Observations: Added new observation of ALLERGY REV: Done (07/16/2010 14:01)    Prescriptions: MOVIPREP 100 GM  SOLR (PEG-KCL-NACL-NASULF-NA ASC-C) As per prep instructions.  #1 x 0   Entered by:   Sundra Aland RN   Authorized by:   Irene Shipper MD   Signed by:   Sundra Aland RN on 07/16/2010   Method used:   Electronically to        [facility]  [phone]* (retail)       [address]       [place], [place]  [address]       Ph: [phone] or [phone]       Fax: [phone]   RxID:   [id number]

## 2010-11-25 NOTE — Letter (Signed)
Summary: Moviprep Instructions  [facility]  [address]   [place], [place] [address]   Phone: [phone]  Fax: [phone]       [name]    05-04-1936    MRN: [id number]        Procedure Day Sudie Grumbling: Wednesday, 07-30-10     Arrival Time: 7:30 a.m.      Procedure Time: 8:00 a.m.     Location of Procedure:                    x   [place] (4th Floor)                        [place]   Starting 5 days prior to your procedure 07-25-10 do not eat nuts, seeds, popcorn, corn, beans, peas,  salads, or any raw vegetables.  Do not take any fiber supplements (e.g. Metamucil, Citrucel, and Benefiber).  THE DAY BEFORE YOUR PROCEDURE         DATE: 07-29-10   DAY: Tuesday  1.  Drink clear liquids the entire day-NO SOLID FOOD  2.  Do not drink anything colored red or purple.  Avoid juices with pulp.  No orange juice.  3.  Drink at least 64 oz. (8 glasses) of fluid/clear liquids during the day to prevent dehydration and help the prep work efficiently.  CLEAR LIQUIDS INCLUDE: Water Jello Ice Popsicles Tea (sugar ok, no milk/cream) Powdered fruit flavored drinks Coffee (sugar ok, no milk/cream) Gatorade Juice: apple, white grape, white cranberry  Lemonade Clear bullion, consomm, broth Carbonated beverages (any kind) Strained chicken noodle soup Hard Candy                             4.  In the morning, mix first dose of MoviPrep solution:    Empty 1 Pouch A and 1 Pouch B into the disposable container    Add lukewarm drinking water to the top line of the container. Mix to dissolve    Refrigerate (mixed solution should be used within 24 hrs)  5.  Begin drinking the prep at 5:00 p.m. The MoviPrep container is divided by 4 marks.   Every 15 minutes drink the solution down to the next mark (approximately 8 oz) until the full liter is complete.   6.  Follow completed prep with 16 oz of clear liquid of your  choice (Nothing red or purple).  Continue to drink clear liquids until bedtime.  7.  Before going to bed, mix second dose of MoviPrep solution:    Empty 1 Pouch A and 1 Pouch B into the disposable container    Add lukewarm drinking water to the top line of the container. Mix to dissolve    Refrigerate  THE DAY OF YOUR PROCEDURE      DATE: 07-30-10  DAY: Wednesday  Beginning at 3:00 a.m. (5 hours before procedure):         1. Every 15 minutes, drink the solution down to the next mark (approx 8 oz) until the full liter is complete.  2. Follow completed prep with 16 oz. of clear liquid of your choice.    3. You may drink clear liquids until  6:00 a.m.  (2 HOURS BEFORE PROCEDURE).   MEDICATION INSTRUCTIONS  Unless otherwise instructed, you should take regular prescription medications with a small sip of water   as early  as possible the morning of your procedure.   Additional medication instructions: Hold fluid pills morning of procedure         OTHER INSTRUCTIONS  You will need a responsible adult at least 73 years of age to accompany you and drive you home.   This person must remain in the waiting room during your procedure.  Wear loose fitting clothing that is easily removed.  Leave jewelry and other valuables at home.  However, you may wish to bring a book to read or  an iPod/MP3 player to listen to music as you wait for your procedure to start.  Remove all body piercing jewelry and leave at home.  Total time from sign-in until discharge is approximately 2-3 hours.  You should go home directly after your procedure and rest.  You can resume normal activities the  day after your procedure.  The day of your procedure you should not:   Drive   Make legal decisions   Operate machinery   Drink alcohol   Return to work  You will receive specific instructions about eating, activities and medications before you leave.    The above instructions have been reviewed  and explained to me by   Sundra Aland RN  July 16, 2010 2:14 PM  I fully understand and can verbalize these instructions _____________________________ Date _________

## 2010-11-25 NOTE — Progress Notes (Signed)
Summary: appt  Phone Note Call from Patient Call back at Home Phone [phone]   Caller: Mom Call For: [name] Summary of Call: Ears clogged, wants to be seen next week, pls advise. Initial call taken by: [name],  April 18, 2010 4:03 PM  Follow-up for Phone Call        called and spoke with pt and appt has been made for her to see TP on tuesday at 10:15.  pt is aware [name] CMA  April 18, 2010 4:09 PM

## 2010-11-25 NOTE — Letter (Signed)
Summary: Previsit letter  [facility]  [place], [place] [address]   Phone: [phone]  Fax: [phone]       06/16/2010 MRN: [id number]  [facility] [place], [place]  [address]  Dear [name],  Welcome to the Gastroenterology Division at [facility].    You are scheduled to see a nurse for your pre-procedure visit on 07/16/2010 at 2:00PM on the 3rd floor at [facility], [place] [employer].  We ask that you try to arrive at our office 15 minutes prior to your appointment time to allow for check-in.  Your nurse visit will consist of discussing your medical and surgical history, your immediate family medical history, and your medications.    Please bring a complete list of all your medications or, if you prefer, bring the medication bottles and we will list them.  We will need to be aware of both prescribed and over the counter drugs.  We will need to know exact dosage information as well.  If you are on blood thinners (Coumadin, Plavix, Aggrenox, Ticlid, etc.) please call our office today/prior to your appointment, as we need to consult with your physician about holding your medication.   Please be prepared to read and sign documents such as consent forms, a financial agreement, and acknowledgement forms.  If necessary, and with your consent, a friend or relative is welcome to sit-in on the nurse visit with you.  Please bring your insurance card so that we may make a copy of it.  If your insurance requires a referral to see a specialist, please bring your referral form from your primary care physician.  No co-pay is required for this nurse visit.     If you cannot keep your appointment, please call [phone] to cancel or reschedule prior to your appointment date.  This allows [place] the opportunity to schedule an appointment for another patient in need of care.    Thank you for choosing [facility] for your  medical needs.  We appreciate the opportunity to care for you.  Please visit [place] at our website  to learn more about our practice.                     Sincerely.                                                                                                                   The Gastroenterology Division

## 2010-11-25 NOTE — Progress Notes (Signed)
Summary: ok to start sulindac   LMTCBX1  Phone Note Call from Patient Call back at Home Phone [phone]   Caller: Patient Call For: wert Reason for Call: Talk to Nurse Summary of Call: pt is taking aleve more than 2-3 times per week.  MW told her to call and he would get her something else to take, to protect her Kidneys. Initial call taken by: Zigmund Gottron,  February 17, 2010 3:00 PM  Follow-up for Phone Call        Cerulean RN  February 17, 2010 3:41 PM pt recently saw MW on 02-12-2010 and d/c instructions state:    4)  CONSIDER CHANGE ALEVE TO SULINDAC NEXT OV AND RECHECK BMET THEN  Pt would like to go ahead and change. Pt states she is taking Aleve  more than 2 to 3 x per week.   Will forward message to MW to address.  Jinny Blossom Reynolds LPN  April 26, 624THL 579FGE AM  sulindac 200 mg one twice daily with meals as needed for joint pain take with meals #60 refill x 11 Follow-up by: Tanda Rockers MD,  February 18, 2010 12:53 PM  Additional Follow-up for Phone Call Additional follow up Details #1::        LMOMTCBX1. Need to know what pharmacy pt wants rx sent to.  Matthew Folks LPN  April 26, 624THL [id number] PM     Additional Follow-up for Phone Call Additional follow up Details #2::    called and spoke with pt.  pt aware rx sent to pharmacy.  Jinny Blossom Reynolds LPN  April 26, 624THL 624THL PM   New/Updated Medications: SULINDAC 200 MG TABS (SULINDAC) Take 1 tablet by mouth two times a day with meals as needed for joint pain Prescriptions: SULINDAC 200 MG TABS (SULINDAC) Take 1 tablet by mouth two times a day with meals as needed for joint pain  #60 x 11   Entered by:   Matthew Folks LPN   Authorized by:   Tanda Rockers MD   Signed by:   Matthew Folks LPN on 624THL   Method used:   Electronically to        [facility]  [phone]* (retail)       [address]       [place], [place]  [address]       Ph: [phone] or [phone]       Fax:  [phone]   RxID:   [id number]

## 2010-11-25 NOTE — Miscellaneous (Signed)
Summary: Orders Update  Clinical Lists Changes  Orders: Added new Test order of T-Bone Densitometry [phone]) - Signed Added new Test order of T-Lumbar Vertebral Assessment [phone]) - Signed

## 2010-11-25 NOTE — Medication Information (Signed)
Summary: Hydrochlorothiazide / Right Source  Hydrochlorothiazide / Right Source   Imported By: Rise Patience 07/02/2010 11:51:49  _____________________________________________________________________  External Attachment:    Type:   Image     Comment:   External Document

## 2010-11-25 NOTE — Procedures (Signed)
Summary: Colonoscopy  Patient: [name] Note: All result statuses are Final unless otherwise noted.  Tests: (1) Colonoscopy (COL)   COL Colonoscopy           [address]     [place], [place]  [address]           COLONOSCOPY PROCEDURE REPORT           PATIENT:  [name], [name]  MR#:  [id number]     BIRTHDATE:  1936/03/14, 74 yrs. old  GENDER:  female     ENDOSCOPIST:  Docia Chuck. Geri Seminole, MD     REF. BY:  Surveillance Program Recall,     PROCEDURE DATE:  07/30/2010     PROCEDURE:  Surveillance Colonoscopy     ASA CLASS:  Class II     INDICATIONS:  history of pre-cancerous (adenomatous) colon polyps,     surveillance and high-risk screening ; index 2003; f/u 2006 w/     small TA     MEDICATIONS:   Fentanyl 100 mcg IV, Versed 9 mg IV           DESCRIPTION OF PROCEDURE:   After the risks benefits and     alternatives of the procedure were thoroughly explained, informed     consent was obtained.  Digital rectal exam was performed and     revealed no abnormalities.   The LB [id number] endoscope     was introduced through the anus and advanced to the cecum, which     was identified by both the appendix and ileocecal valve, without     limitations.Time to cecum = 3:59 min. The quality of the prep was     excellent, using MoviPrep.  The instrument was then slowly     withdrawn (time = 10:26 min) as the colon was fully examined.     <<PROCEDUREIMAGES>>           FINDINGS:  Moderate diverticulosis was found throughout the colon.     This was otherwise a normal examination of the colon.  No polyps     or cancers were seen.   Retroflexed views in the rectum revealed     internal hemorrhoids.    The scope was then withdrawn from the     patient and the procedure completed.           COMPLICATIONS:  None     ENDOSCOPIC IMPRESSION:     1) Moderate diverticulosis throughout the colon     2) Otherwise normal examination     3) No  polyps or cancers     4) Internal hemorrhoids     RECOMMENDATIONS:     1) Follow up colonoscopy in 5 years           ______________________________     Docia Chuck. Geri Seminole, MD           CC:  Tanda Rockers, MD; The Patient           n.     eSIGNED:   Docia Chuck. Geri Seminole at 07/30/2010 08:37 AM           [name], [name], [id number]  Note: An exclamation mark (!) indicates a result that was not dispersed into the flowsheet. Document Creation Date: 07/30/2010 8:39 AM _______________________________________________________________________  (1) Order result status: Final Collection or observation date-time: 07/30/2010 08:32 Requested date-time:  Receipt date-time:  Reported date-time:  Referring Physician:   Ordering Physician: Lavena Bullion [phone]) Specimen Source:  Source: [name] Order Number: [id number] Lab site:   Appended Document: Colonoscopy    Clinical Lists Changes  Observations: Added new observation of COLONNXTDUE: 07/2015 (07/30/2010 12:04)

## 2010-11-25 NOTE — Progress Notes (Signed)
Summary: bone density order> submitted  Phone Note Call from Patient   Caller: Patient Call For: Valine Drozdowski Summary of Call: Pt states her GYN told her MW was going to order her to have a bone density scan, I do not see any mention of this. Please advise. [place] Bing CMA  February 19, 2010 11:29 AM   Follow-up for Phone Call        That's fine as long as it's been over 2 years since last one. I'll order it today and we will send him a copy when available. Follow-up by: Tanda Rockers MD,  February 19, 2010 1:22 PM  Additional Follow-up for Phone Call Additional follow up Details #1::        pt advised order palced.[place] Bing CMA  February 19, 2010 1:38 PM

## 2010-11-25 NOTE — Assessment & Plan Note (Signed)
Summary: NP follow up - ears stopped up   Primary Provider/Referring Provider:  [name]  CC:  bilateral ear wash - states ears have felt "stopped up" x3weeks.  History of Present Illness: 74  yobf never smoker  with morbid obesity complicated by hypertension and tendency to fluid retention controlled on spironolactone and hydrochlorothiazide daily.  Hx of intermittent leg swelling with negative venous Dopplers in the past.  Labs 11/08 showed low vitamin d level at 20, started on vitamin D 50k qwk, vit d level normalized  06/26/08 ov no new co's, leg swelling better. occ HB without dysphagia.  October 30, 2008 cpx co increaese in left foot  swelling when standing alot otherwise no co's. Podiatrist want to operate on it. no pain,   walking ok.   No tia, claudiation symptoms or cp/sob  January 01, 2009-r work in visit c/o  bilat hip pain toward the buttock area, also c/o increased edema in lower left leg and a "crawling" sensation in the right groin area.  d as chronic venous insuff. prev ven dopp neg.    01/15/09 ov no better, pain both hips but worse on right rad to post right without numbness, no back pain or change in bowel or bladder habits. no fever. rec ortho eval rec muphy/wainer and rec short cycle of prednisone.  Prednsione did help transiently Ended up seeing Dr Evelina Bucy and Maureen Ralphs separately,  rec aleve/ tylenol not effective - no f/u pain   November  4, Followup for tramadol refill.  Pt states that she needs this med for arthritis pain in back.     September 30, 2009 --Presents for follow up and med review. Pt brought all her meds today, we updated her med calendar.    November 18, 2009 6 wk followup.  Pt states that she is doing well and denies any complaints today. Needing tramadol and all her prns less using med calendar consistently.February 12, 2010  cpx no new c/os.   --labs and bmd reviewed. Aleve changed to Sulindac.   May 01, 2010-Presents for work in visit. Complains that ears have  felt "stopped up" x3weeks. More allergy symptoms this summer. Nose feels stopped up/stuffy.  Has had a few episodes of lightheadedness. Ears are stopped up, used sweet oil in ears but did not help. Has been doing alot of yard work. Last visit aleve changed to sulindac. Will need bmet -she has renal insufficiency w/ last scr at 1.5. Sulindac does not help.  Denies chest pain, dyspnea, orthopnea, hemoptysis, fever, n/v/d, edema, headache, visual/speech changes.   Medications Prior to Update: 1)  Centrum Silver  Tabs (Multiple Vitamins-Minerals) .... Take 1 Tablet By Mouth Once A Day 2)  Glucosamine-Chondroitin 500-400 Mg  Tabs (Glucosamine-Chondroitin) .... Take 2 Tablets By Mouth Once A Day 3)  Calcium Carbonate-Vitamin D 600-400 Mg-Unit  Tabs (Calcium Carbonate-Vitamin D) .... 2 Tabs By Mouth Once Daily 4)  Vitamin D3 2000 Unit Caps (Cholecalciferol) .... Take 1 Capsule By Mouth Once A Day 5)  Hydrochlorothiazide 25 Mg Tabs (Hydrochlorothiazide) .... Take 1 Tablet By Mouth Once A Day 6)  Spironolactone 25 Mg Tabs (Spironolactone) .... 2 Tabs By Mouth Once Daily 7)  Bayer Aspirin Ec Low Dose 81 Mg  Tbec (Aspirin) .... Take 1 Tablet By Mouth Once A Day 8)  Fish Oil 1000 Mg Caps (Omega-3 Fatty Acids) .... Take 1 Capsule By Mouth Once A Day 9)  Sulindac 200 Mg Tabs (Sulindac) .... Take 1 Tablet By Mouth  Two Times A Day With Meals As Needed For Joint Pain 10)  Tramadol Hcl 50 Mg Tabs (Tramadol Hcl) .[name].. 1 By Mouth Every 4 Hr As Needed Pain 11)  Metamucil Smooth Texture 63 % Powd (Psyllium) .[name].. 1 Tablespoon At Bedtime As Needed  Current Medications (verified): 1)  Centrum Silver  Tabs (Multiple Vitamins-Minerals) .... Take 1 Tablet By Mouth Once A Day 2)  Glucosamine-Chondroitin 500-400 Mg  Tabs (Glucosamine-Chondroitin) .... Take 2 Tablets By Mouth Once A Day 3)  Calcium Carbonate-Vitamin D 600-400 Mg-Unit  Tabs (Calcium Carbonate-Vitamin D) .... 2 Tabs By Mouth Once Daily 4)  Vitamin D3 2000 Unit  Caps (Cholecalciferol) .... Take 1 Capsule By Mouth Once A Day 5)  Hydrochlorothiazide 25 Mg Tabs (Hydrochlorothiazide) .... Take 1 Tablet By Mouth Once A Day 6)  Spironolactone 25 Mg Tabs (Spironolactone) .... 2 Tabs By Mouth Once Daily 7)  Bayer Aspirin Ec Low Dose 81 Mg  Tbec (Aspirin) .... Take 1 Tablet By Mouth Once A Day 8)  Fish Oil 1000 Mg Caps (Omega-3 Fatty Acids) .... Take 1 Capsule By Mouth Once A Day 9)  Sulindac 200 Mg Tabs (Sulindac) .... Take 1 Tablet By Mouth Two Times A Day With Meals As Needed For Joint Pain 10)  Tramadol Hcl 50 Mg Tabs (Tramadol Hcl) .[name].. 1 By Mouth Every 4 Hr As Needed Pain 11)  Metamucil Smooth Texture 63 % Powd (Psyllium) .[name].. 1 Tablespoon At Bedtime As Needed  Allergies (verified): 1)  ! Iron  Past History:  Past Surgical History: Last updated: 10/30/2008 Cataract durgery (rt eye) 2009 Hysterectomy 1980 Right TKR 2002 Left TKR 2003  Family History: Last updated: 10/30/2008  Positive for heart disease in remote members of her family only.  No breast or colon cancer in her family to her knowledge.  Her brother was a smoker had lung cancer.   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  [employer] parttime Never smoker No ETOH  Risk Factors: Alcohol Use: 0 (02/12/2010)  Risk Factors: Smoking Status: never (02/12/2010)  Past Medical History: Colon polyps     - colonoscopy 04/08/2005 CHRONIC RHINITIS (ICD-472.0)     - tried immunotherapy 9/00 MORBID OBESITY (ICD-278.01)    - Target wt  =  163 for BMI < 30  DEGENERATIVE JOINT DISEASE (ICD-715.90).....................Marland KitchenMarland KitchenAlusio/ [facility] HYPERTENSION (ICD-401.9) CHRONIC RENAL INSUFFICIENCY     - Creat 1.5 February 12, 2010   HEALTH MAINTENANCE.........................................................Marland KitchenWert     - dt 11/08      - Pneumovax 11/04 age 56      - CPX February 12, 2010  GYN  Care.................................................................................[facility]       - Bone density/mammography per gyn      -BMD 2011-nml   Review of Systems      See HPI  Vital Signs:  Patient profile:   75 year old female Height:      62.25 inches Weight:      168 pounds BMI:     30.59 O2 Sat:      98 % on Room air Temp:     97.2 degrees F oral Pulse rate:   81 / minute BP sitting:   130 / 70  (left arm) Cuff size:   regular  Vitals Entered By: Parke Poisson CNA/MA (May 01, 2010 10:04 AM)  O2 Flow:  Room air CC: bilateral ear wash - states ears have felt "stopped up" x3weeks Is  Patient Diabetic? No Comments Medications reviewed with patient Daytime contact number verified with patient. Parke Poisson CNA/MA  May 01, 2010 10:04 AM    Physical Exam  Additional Exam:  wt  165 October 30, 2008 >> 165 January 01, 2009 >165 September 30, 2009 > 168 November 18, 2009 > 165 February 12, 2010 >>168 May 01, 2010  Ambulatory healthy appearing in no acute distress,  HEENT: edentulous , pale nasal mucosa.  TM nml, EAC clear.  Neck without JVD/Nodes/TM Lungs clear to A and P bilaterally without cough on insp or exp maneuvers RRR no s3 or murmur or increase in P2 Abd soft and benign with nl excursion in the supine position. No bruits or organomegaly Ext warm without calf tenderness, cyanosis clubbing  Skin warm and dry without lesions   MS  pulses sym and intact, equal strength,   nml gait. , arthritic changes in hands.  Neuro Intact w/ no focal deficits noted. equal strength bilaterally.    Impression & Recommendations:  Problem # 1:  RENAL INSUFFICIENCY (ICD-588.9) sCr 1.5, will recheck bmet now that she is on sulindac  Orders: TLB-BMP (Basic Metabolic Panel-BMET) (99991111) Est. Patient Level III [id number])  Problem # 2:  CHRONIC RHINITIS (ICD-472.0)  Flare  REC:  saline nasal rinses as needed  allegra 180mg  x 5 d, then as needed   Orders: Est. Patient  Level III [id number])  Complete Medication List: 1)  Centrum Silver Tabs (Multiple vitamins-minerals) .... Take 1 tablet by mouth once a day 2)  Glucosamine-chondroitin 500-400 Mg Tabs (Glucosamine-chondroitin) .... Take 2 tablets by mouth once a day 3)  Calcium Carbonate-vitamin D 600-400 Mg-unit Tabs (Calcium carbonate-vitamin d) .... 2 tabs by mouth once daily 4)  Vitamin D3 2000 Unit Caps (Cholecalciferol) .... Take 1 capsule by mouth once a day 5)  Hydrochlorothiazide 25 Mg Tabs (Hydrochlorothiazide) .... Take 1 tablet by mouth once a day 6)  Spironolactone 25 Mg Tabs (Spironolactone) .... 2 tabs by mouth once daily 7)  Bayer Aspirin Ec Low Dose 81 Mg Tbec (Aspirin) .... Take 1 tablet by mouth once a day 8)  Fish Oil 1000 Mg Caps (Omega-3 fatty acids) .... Take 1 capsule by mouth once a day 9)  Sulindac 200 Mg Tabs (Sulindac) .... Take 1 tablet by mouth two times a day with meals as needed for joint pain 10)  Tramadol Hcl 50 Mg Tabs (Tramadol hcl) .[name].. 1 by mouth every 4 hr as needed pain 11)  Metamucil Smooth Texture 63 % Powd (Psyllium) .[name].. 1 tablespoon at bedtime as needed  Patient Instructions: 1)  Allegra 180mg  once daily for 5 days then as needed daily (may get this over the counter. ) 2)  Saline nasal rinses as needed  3)  I will call with labs results.  4)  follow up Dr. [name] in 3 months and as needed  5)  Please contact office for sooner follow up if symptoms do not improve or worsen

## 2010-11-25 NOTE — Assessment & Plan Note (Signed)
Summary: flu vacc ///kp   Nurse Visit   Allergies: 1)  ! Iron  Orders Added: 1)  Admin 1st Vaccine [90471] 2)  Flu Vaccine 5yrs + [id number]  Flu Vaccine Consent Questions     Do you have a history of severe allergic reactions to this vaccine? no    Any prior history of allergic reactions to egg and/or gelatin? no    Do you have a sensitivity to the preservative Thimersol? no    Do you have a past history of Guillan-Barre Syndrome? no    Do you currently have an acute febrile illness? no    Have you ever had a severe reaction to latex? no    Vaccine information given and explained to patient? yes    Are you currently pregnant? no    Lot Number:[id number]   Exp Date:04/24/2010   Site Given  Left Deltoid IM Tammy Scott  August 14, 2009 3:48 PM

## 2010-11-25 NOTE — Progress Notes (Signed)
Summary: med  Phone Note Call from Patient Call back at [facility] Phone [phone]   Caller: Patient Call For: tammy P Reason for Call: Talk to Nurse, Insurance Question Summary of Call: Tammy suggested pt try Tylenol.  Tylenol has been pulled off the shelf, what can she use? Initial call taken by: Zigmund Gottron,  May 13, 2010 4:17 PM  Follow-up for Phone Call        [email or web address] [name] [facility]  May 13, 2010 4:22 PM   per pharmacy, the name brand Tylenol has been pulled by the manufacturer.  pt may still use the generic acetamenaphine (i.e. [facility] or [facility] brand).  LMOM TCB. Parke Poisson CNA/MA  May 14, 2010 9:02 AM   Additional Follow-up for Phone Call Additional follow up Details #1::        [email or web address] [name] CMA  May 14, 2010 12:29 PM   patient returned call.  advised that she may use the generic form of tylenol.  pt verbalized her understanding. Parke Poisson CNA/MA  May 15, 2010 10:41 AM

## 2010-11-25 NOTE — Assessment & Plan Note (Signed)
Summary: Primary svc/  ext f/u ov multiple issues   Primary Provider/Referring Provider:  [name]  CC:  Followup for tramadol refill.  Pt states that she needs this med for arthritis pain in back.  Has no other complaints today.[name]  History of Present Illness: 75 yobf never smoker  with morbid obesity complicated by hypertension and tendency to fluid retention controlled on spironolactone and hydrochlorothiazide daily.yobf never smoker  with morbid obesity complicated by hypertension and tendency to fluid retention controlled on spironolactone and hydrochlorothiazide daily.  Hx of intermittent leg swelling with negative venous Dopplers in the past.  Labs 11/08 showed low vitamin d level at 20, started on vitamin D 50k qwk, vit d level normalized  06/26/08 ov no new co's, leg swelling better. occ HB without dysphagia.  October 30, 2008 cpx co increaese in left foot  swelling when standing alot otherwise no co's. Podiatrist want to operate on it. no pain,   walking ok.   No tia, claudiation symptoms or cp/sob  January 01, 2009-r work in visit c/o  bilat hip pain toward the buttock area, also c/o increased edema in lower left leg and a "crawling" sensation in the right groin area.  denies change in color of urine, odor or pain with urination, ext weakness, chest pain, dyspnea. Has chronic venous insuff. prev ven dopp neg. no leg redness, or fever. meds not helping. =aleve/robaxin.   01/15/09 ov no better, pain both hips but worse on right rad to post right without numbness, no back pain or change in bowel or bladder habits. no fever. rec ortho eval rec muphy/wainer and rec short cycle of prednisone.  Prednsione did help transiently Ended up seeing Dr Evelina Bucy and Maureen Ralphs separately,  rec aleve/ tylenol not effective - no f/u pain   November  4, Followup for tramadol refill.  Pt states that she needs this med for arthritis pain in back.  only using tramadol once a day with back pain, no radiation down leg or numbness/ weakness.  Pt denies any significant sore throat, nasal congestion or excess secretions, fever, chills, sweats, unintended wt loss, pleuritic or  exertional cp, orthopnea pnd or leg swelling.    Current Medications (verified): 1)  Super Multiple   Caps (Multiple Vitamins-Minerals) .... Take 1 Tablet By Mouth Once A Day 2)  Glucosamine-Chondroitin 500-400 Mg  Tabs (Glucosamine-Chondroitin) .... Take 1 Tablet By Mouth Once A Day 3)  Calcium-Magnesium 300-300 Mg  Tabs (Calcium-Magnesium) .... 2 Tabs By Mouth Once Daily 4)  Hydrochlorothiazide 25 Mg Tabs (Hydrochlorothiazide) .... Take 1 Tablet By Mouth Once A Day and 1 Extra As Needed 5)  Spironolactone 25 Mg Tabs (Spironolactone) .... 2 Tabs By Mouth Once Daily 6)  Bayer Aspirin Ec Low Dose 81 Mg  Tbec (Aspirin) .... Take 1 Tablet By Mouth Once A Day 7)  Delsym 30 Mg/60ml  Lqcr (Dextromethorphan Polistirex) .... 2 Tsp Two Times A Day As Needed 8)  Robaxin 500 Mg  Tabs (Methocarbamol) .[name].. 1 Tab Every 8 Hours As Needed 9)  Metamucil 48.57 % Powd (Psyllium) .[name].. 1 Tablespoon At Bedtime 10)  Hydrochlorothiazide 25 Mg Tabs (Hydrochlorothiazide) .[name].. 1 Extra Daily As Needed 11)  Maalox Plus 225-200-25 Mg/72ml Susp (Alum & Mag Hydroxide-Simeth) .... As Needed For Heart Burn 12)  Tramadol Hcl 50 Mg Tabs (Tramadol Hcl) .[name].. 1 By Mouth Every 6 Hr As Needed Pain 13)  Aleve 220 Mg Tabs (Naproxen Sodium) .... 2 Two Times A Day As Needed  Allergies (verified): 1)  ! Iron  Past History:  Past Medical History: Colon polyps     - colonoscopy 04/08/2005  CHRONIC RHINITIS (ICD-472.0)     - tried immunotherapy 9/00 MORBID OBESITY (ICD-278.01)    - Target wt  =  163 for BMI < 30  DEGENERATIVE JOINT DISEASE (ICD-715.90).....................Felicia KitchenMarland KitchenAlusio/ [place] [facility] HYPERTENSION (ICD-401.9)  Benjamin 11/08      - Pneumovax 11/04      - CPX October 30, 2008  GYN Care.................................................................................[facility]       - Bone density/mammography per gyn  Vital Signs:  Patient profile:   75 year old female Weight:      162 pounds O2 Sat:       96 % on Room air Temp:     98.2 degrees F oral Pulse rate:   86 / minute BP sitting:   130 / 78  (left arm)  Vitals Entered By: Tilden Dome (August 29, 2009 10:44 AM)  O2 Flow:  Room air  Physical Exam  Additional Exam:  wt  165 October 30, 2008 >> 165 January 01, 2009 >  166 23/23/10 >  162 August 29, 2009  Ambulatory healthy appearing in no acute distress,  not answering questions in straightforward manner  (baseline) HEENT: nl dentition, turbinates, and orophanx. Nl external ear canals without cough reflex Neck without JVD/Nodes/TM Lungs clear to A and P bilaterally without cough on insp or exp maneuvers RRR no s3 or murmur or increase in P2 Abd soft and benign with nl excursion in the supine position. No bruits or organomegaly Ext warm without calf tenderness, cyanosis clubbing,neg homan sign Skin warm and dry without lesions   MS  pulses sym and intact, tenderness along lumbar/sacral, equal strength,  neg slr, nml gait, neg cva tenderness.    Impression & Recommendations:  Problem # 1:  BACK PAIN (ICD-724.5) Tolerable for now with no radicular features or neuro deficits with neg w/u by [facility] but needs to use one othopedic group for f/u = Dr Alusio's group unless insurance won't allow.  As long as can control pain with 60 tramadol a month and no escalation of symptoms or tramadol need we can put off referral back to ortho for now  Problem # 2:  HYPERTENSION (ICD-401.9)  No edema, ok control, so no change in rx  Her updated medication list for this problem includes:    Hydrochlorothiazide 25 Mg Tabs (Hydrochlorothiazide) .[name]... Take 1 tablet by mouth once a day and 1 extra as needed    Spironolactone 25 Mg Tabs (Spironolactone) .[name]... 2 tabs by mouth once daily    Hydrochlorothiazide 25 Mg Tabs (Hydrochlorothiazide) .[name]... 1 extra daily as needed  Orders: Est. Patient Level IV [id number])  Problem # 3:  MORBID OBESITY (ICD-278.01) Now at goal, no change in rx.  I had an  extended discussion with the patient today lasting 15 to 20 minutes of a 25 minute visit on the following issues:   Each maintenance medication was reviewed in detail including most importantly the difference between maintenance and as needed and under what circumstances the prns are to be used. She struggles with concept of med reconciliation and has again lost her med calendar.  To keep things simple, I have asked the patient to first separate medicines that are perceived as maintenance, that is to be taken daily "no matter what", from those medicines that are taken on only on an as-needed basis and I have given the patient examples of both, and then return to see our NP to generate a  detailed  medication calendar which should be followed until the next physician sees the patient and updates it.   Once we're sure that we're all reading from the same page in terms of medication admiistration, she needs return for CPX.  Other Orders: Misc. Referral (Misc. Ref) Misc. Referral (Misc. Ref)  Patient Instructions: 1)  See Tammy NP w/in 4 weeks with all your medications, even over the counter meds, separated in two separate bags, the ones you take no matter what vs the ones you stop once you feel better and take only as needed.  She will generate for you a new user friendly medication calendar that will put [place] all on the same page re: your medication use.  2)  Return here for cpx after January 5th Prescriptions: TRAMADOL HCL 50 MG TABS (TRAMADOL HCL) 1 by mouth every 6 hr as needed pain  #60 x 5   Entered and Authorized by:   Tanda Rockers MD   Signed by:   Tanda Rockers MD on 08/29/2009   Method used:   Electronically to        [facility]  [phone]* (retail)       [address]       [place], [place]  [address]       Ph: [phone] or [phone]       Fax: [phone]   RxID:   [id number]

## 2010-11-25 NOTE — Assessment & Plan Note (Signed)
Summary: Pulmonary/ cpx creat up to 1.5 consider d/c aleve   Primary Provider/Referring Provider:  [name]  CC:  CPX.  Fasting.  No complaints today.[name]  History of Present Illness: 75 yobf never smoker  with morbid obesity complicated by hypertension and tendency to fluid retention controlled on spironolactone and hydrochlorothiazide daily.  Hx of intermittent leg swelling with negative venous Dopplers in the past.  Labs 11/08 showed low vitamin d level at 20, started on vitamin D 50k qwk, vit d level normalized  06/26/08 ov no new co's, leg swelling better. occ HB without dysphagia.  October 30, 2008 cpx co increaese in left foot  swelling when standing alot otherwise no co's. Podiatrist want to operate on it. no pain,   walking ok.   No tia, claudiation symptoms or cp/sob  January 01, 2009-r work in visit c/o  bilat hip pain toward the buttock area, also c/o increased edema in lower left leg and a "crawling" sensation in the right groin area.  denies change in color of urine, odor or pain with urination, ext weakness, chest pain, dyspnea. Has chronic venous insuff. prev ven dopp neg. no leg redness, or fever. meds not helping. =aleve/robaxin.   01/15/09 ov no better, pain both hips but worse on right rad to post right without numbness, no back pain or change in bowel or bladder habits. no fever. rec ortho eval rec muphy/wainer and rec short cycle of prednisone.  Prednsione did help transiently Ended up seeing Dr Evelina Bucy and Maureen Ralphs separately,  rec aleve/ tylenol not effective - no f/u pain   November  4, Followup for tramadol refill.  Pt states that she needs this med for arthritis pain in back.  only using tramadol once a day with back pain, no radiation down leg or numbness/ weakness.    September 30, 2009 --Presents for follow up and med review. Pt brought all her meds today, we updated her med calendar. She is doing well. Does have arthritic pain in hands and back , tramadol helps.  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   November 18, 2009 6 wk followup.  Pt states that she is doing well and denies any complaints today. Needing tramadol and all her prns less using med calendar consistently.February 12, 2010  cpx no new c/os. Pt denies any significant sore throat, nasal congestion or excess secretions, fever, chills, sweats, unintended wt loss, pleuritic or exertional cp, orthopnea pnd or leg swelling.    Preventive Screening-Counseling & Management  Alcohol-Tobacco     Alcohol drinks/day: 0     Smoking Status: never  Current Medications (verified): 1)  Metamucil Smooth Texture 63 % Powd (Psyllium) .[name].. 1 Tablespoon At Bedtime As Needed 2)  Centrum Silver  Tabs (Multiple Vitamins-Minerals) .... Take 1 Tablet By Mouth Once A Day 3)  Glucosamine-Chondroitin 500-400 Mg  Tabs (Glucosamine-Chondroitin) .... Take 2 Tablets By Mouth Once A Day 4)  Calcium Carbonate-Vitamin D 600-400 Mg-Unit  Tabs (Calcium Carbonate-Vitamin D) .... 2 Tabs By Mouth Once Daily 5)  Vitamin D3 2000 Unit Caps (Cholecalciferol) .... Take 1 Capsule By Mouth Once A Day 6)  Hydrochlorothiazide 25 Mg Tabs (Hydrochlorothiazide) .... Take 1 Tablet By Mouth Once A Day 7)  Spironolactone 25 Mg Tabs (Spironolactone) .... 2 Tabs By Mouth Once Daily 8)  Bayer Aspirin Ec Low Dose 81 Mg  Tbec (Aspirin) .... Take 1 Tablet By Mouth Once A Day 9)  Fish Oil 1000 Mg Caps (Omega-3 Fatty Acids) .Marland KitchenMarland KitchenMarland Kitchen  Take 1 Capsule By Mouth Once A Day 10)  Aleve 220 Mg Tabs (Naproxen Sodium) .... Per Bottle 11)  Tramadol Hcl 50 Mg Tabs (Tramadol Hcl) .[name].. 1 By Mouth Every 4 Hr As Needed Pain  Allergies (verified): 1)  ! Iron  Past History:  Past Medical History: Colon polyps     - colonoscopy 04/08/2005 CHRONIC RHINITIS (ICD-472.0)     - tried immunotherapy 9/00 MORBID OBESITY (ICD-278.01)    - Target wt  =  163 for BMI < 30  DEGENERATIVE JOINT DISEASE (ICD-715.90).....................Marland KitchenMarland KitchenAlusio/ [facility] HYPERTENSION  (ICD-401.9) CHRONIC RENAL INSUFFICIENCY     - Creat 1.5 February 12, 2010   HEALTH MAINTENANCE.........................................................Marland KitchenWert     - dt 11/08      - Pneumovax 11/04 age 75      - CPX February 12, 2010  GYN Care.................................................................................[facility]       - Bone density/mammography per gyn  Family History: Reviewed history from 10/30/2008 and no changes required.  Positive for heart disease in remote members of her family only.  No breast or colon cancer in her family to her knowledge.  Her brother was a smoker had lung cancer.   Social History: Reviewed history from 10/30/2008 and no changes required. 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  [employer] parttime Never smoker No ETOH Alcohol drinks/day:  0  Vital Signs:  Patient profile:   75 year old female Height:      62.25 inches (158.12 cm) Weight:      165 pounds (75 kg) BMI:     30.05 O2 Sat:      98 % on Room air Temp:     97.6 degrees F (36.44 degrees C) oral Pulse rate:   77 / minute BP sitting:   120 / 78  (left arm) Cuff size:   regular  Vitals Entered By: Francesca Jewett CMA (February 12, 2010 9:04 AM)  O2 Sat at Rest %:  98 O2 Flow:  Room air CC: CPX.  Fasting.  No complaints today. Comments Medications reviewed with the patient. Daytime phone number verified. Francesca Jewett [facility]  February 12, 2010 9:07 AM   Physical Exam  Additional Exam:  wt  165 October 30, 2008 >> 165 January 01, 2009 >165 September 30, 2009 > 168 November 18, 2009 > 165 February 12, 2010  Ambulatory healthy appearing in no acute distress,  HEENT: edentulous  turbinates, and orophanx. Nl external ear canals without cough reflex Neck without JVD/Nodes/TM Lungs clear to A and P bilaterally without cough on insp or exp maneuvers RRR no s3 or murmur or increase in P2 Abd soft and benign with nl excursion in the supine position.  No bruits or organomegaly Ext warm without calf tenderness, cyanosis clubbing  Skin warm and dry without lesions   MS  pulses sym and intact, equal strength,   nml gait. , arthritic changes in hands.   Cholesterol               173 mg/dL                   0-200     ATP III Classification            Desirable:  < 200 mg/dL                    Borderline High:  200 - 239 mg/dL  High:  > = 240 mg/dL   Triglycerides             84.0 mg/dL                  0.0-149.0     Normal:  <150 mg/dL     Borderline High:  150 - 199 mg/dL   HDL                       50.30 mg/dL                 >39.00   VLDL Cholesterol          16.8 mg/dL                  0.0-40.0   LDL Cholesterol      [H]  106 mg/dL                   0-99  CHO/HDL Ratio:  CHD Risk                             3                    Men          Women     1/2 Average Risk     3.4          3.3     Average Risk          5.0          4.4     2X Average Risk          9.6          7.1     3X Average Risk          15.0          11.0                           Tests: (2) BMP (METABOL)   Sodium                    143 mEq/L                   135-145   Potassium                 4.3 mEq/L                   3.5-5.1   Chloride                  106 mEq/L                   96-112   Carbon Dioxide            31 mEq/L                    19-32   Glucose                   95 mg/dL                    70-99   BUN                  [H]  28 mg/dL  6-23   Creatinine           [H]  1.5 mg/dL                   0.4-1.2   Calcium                   9.7 mg/dL                   8.4-10.5   GFR                       43.86 mL/min                >60  Tests: (3) Hepatic/Liver Function Panel (HEPATIC)   Total Bilirubin           0.5 mg/dL                   0.3-1.2   Direct Bilirubin          0.1 mg/dL                   0.0-0.3   Alkaline Phosphatase      54 U/L                      39-117   AST                       27 U/L                       0-37   ALT                       26 U/L                      0-35   Total Protein             7.1 g/dL                    6.0-8.3   Albumin                   4.0 g/dL                    3.5-5.2  Tests: (4) CBC Platelet w/Diff (CBCD)   White Cell Count          5.9 K/uL                    4.5-10.5   Red Cell Count       [L]  3.62 Mil/uL                 3.87-5.11   Hemoglobin           [L]  11.8 g/dL                   12.0-15.0   Hematocrit           [L]  34.9 %                      36.0-46.0   MCV                       96.3 fl  78.0-100.0   MCHC                      33.8 g/dL                   30.0-36.0   RDW                       13.5 %                      11.5-14.6   Platelet Count            259.0 K/uL                  150.0-400.0   Neutrophil %              60.5 %                      43.0-77.0   Lymphocyte %              25.8 %                      12.0-46.0   Monocyte %                8.2 %                       3.0-12.0   Eosinophils%              4.6 %                       0.0-5.0   Basophils %               0.9 %                       0.0-3.0   Neutrophill Absolute      3.5 K/uL                    1.4-7.7   Lymphocyte Absolute       1.5 K/uL                    0.7-4.0   Monocyte Absolute         0.5 K/uL                    0.1-1.0  Eosinophils, Absolute                             0.3 K/uL                    0.0-0.7   Basophils Absolute        0.1 K/uL                    0.0-0.1  Tests: (5) TSH (TSH)   FastTSH                   2.53 uIU/mL                 0.35-5.50  Tests: (6) UDip w/Micro (URINE)   Color                     LT. YELLOW  RANGE:  Yellow;Lt. Yellow   Clarity                   CLEAR                       Clear   Specific Gravity          1.010                       1.000 - 1.030   Urine Ph                  7.5                         5.0-8.0   Protein                   NEGATIVE                    Negative   Urine Glucose              NEGATIVE                    Negative   Ketones                   NEGATIVE                    Negative   Urine Bilirubin           NEGATIVE                    Negative   Blood                     NEGATIVE                    Negative   Urobilinogen              0.2                         0.0 - 1.0   Leukocyte Esterace        TRACE                       Negative   Nitrite                   NEGATIVE                    Negative   Urine WBC                 0-2/hpf                     0-2/hpf   Urine RBC                 0-2/hpf                     0-2/hpf   Urine Epith               Rare(0-4/hpf)               Rare(0-4/hpf)   Renal Epithelial          Rare(0-4/hpf)  None  CXR  Procedure date:  02/12/2010  Findings:      Comparison: 10/30/2008   Findings: Trachea is midline.  Heart size normal.  Lungs are clear. No pleural fluid.   IMPRESSION: No acute findings.  Impression & Recommendations:  Problem # 1:  HYPERTENSION (ICD-401.9) ok on rx  BP today: 120/78 Prior BP: 130/70 (11/18/2009)  Labs Reviewed: K+: 4.1 (10/30/2008) Creat: : 1.4 (10/30/2008)  > 1.5 February 12, 2010    Chol: 177 (10/30/2008)   HDL: 55.8 (10/30/2008)   LDL: 107 (10/30/2008)   TG: 71 (10/30/2008)  Problem # 2:  RENAL INSUFFICIENCY (ICD-588.9) Creat 1.4 -1.5 probably represents hbp effects, monitor for now but careful with nsaid use, may change to sulindac if needs aleve often  Problem # 3:  EDEMA (ICD-782.3) resolved on spirinolactone  Problem # 4:  VITAMIN D DEFICIENCY (ICD-268.9) . resolved, bone density per gyn  Problem # 5:  MORBID OBESITY (ICD-278.01)  Weight control is a matter of calorie balance which needs to be tilted in the pt's favor by eating less and exercising more.  Specifically, I recommended  exercise at a level where pt  is short of breath but not out of breath 30 minutes daily.  If not losing weight on this program, I would strongly recommend pt see a nutritionist with  a food diary recorded for two weeks prior to the visit.    Each maintenance medication was reviewed in detail including most importantly the difference between maintenance and as needed and under what circumstances the prns are to be used. This was done in the context of a medication calendar review which provided the patient with a user-friendly unambiguous mechanism for medication administration and reconciliation and provides an action plan for all active problems. It is critical that this be shown to every doctor  for modification during the office visit if necessary so the patient can use it as a working document  Medications Added to Medication List This Visit: 1)  Metamucil Smooth Texture 63 % Powd (Psyllium) .[name].. 1 tablespoon at bedtime as needed  Other Orders: T-Vitamin D (25-Hydroxy) [id number]) T-2 View CXR (71020TC) Est. Patient 65& > [id number]) TLB-Lipid Panel (80061-LIPID) TLB-BMP (Basic Metabolic Panel-BMET) (99991111) TLB-Hepatic/Liver Function Pnl (80076-HEPATIC) TLB-CBC Platelet - w/Differential (85025-CBCD) TLB-TSH (Thyroid Stimulating Hormone) (84443-TSH) TLB-Udip ONLY (81003-UDIP)  Patient Instructions: 1)  Call [phone] for your results w/in next 3 days - if there's something important  I feel you need to know,  I'll be in touch with you directly.  2)  Return to office in 3 months, sooner if needed  3)  See calendar for specific medication instructions and bring it back for each and every office visit for every healthcare provider you see.  Without it,  you may not receive the best quality medical care that we feel you deserve.  4)  CONSIDER CHANGE ALEVE TO SULINDAC NEXT OV AND RECHECK BMET THEN     [facility] ECG  ID: [id number] Patient: [name], [name] DOB: May 05, 1936 Age: 75 Years Old Sex: Female Race: Black Height: 62.25 Weight: 165 Status: Unconfirmed Past Medical History:  Colon polyps     - colonoscopy 04/08/2005 CHRONIC RHINITIS  (ICD-472.0)     - tried immunotherapy 9/00 MORBID OBESITY (ICD-278.01)    - Target wt  =  163 for BMI < 30  DEGENERATIVE JOINT DISEASE (ICD-715.90).....................Marland KitchenMarland KitchenAlusio/ [facility] HYPERTENSION (ICD-401.9)  HEALTH MAINTENANCE.........................................................[place] 11/08      - Pneumovax 11/04      -  CPX October 30, 2008  GYN Care.................................................................................[place]       - Bone density/mammography per gyn  Recorded: 02/12/2010 09:13 AM P/PR: 118 ms / 208 ms - Heart rate (maximum exercise) QRS: 92 QT/QTc/QTd: 429 ms / 431 ms / 62 ms - Heart rate (maximum exercise)  P/QRS/T axis: 62 deg / 50 deg / 21 deg - Heart rate (maximum exercise)  Heartrate: 61 bpm  Interpretation:   sinus rhythm    Q > 40 ms in aVF    with Q in II III   Q/R > 1/3 in aVF   Abnormal ECG no change from 10/2008 study

## 2010-11-25 NOTE — Progress Notes (Signed)
Summary: prescript  Phone Note Call from Patient   Caller: Patient Call For: wert Summary of Call: pt need prescript for arthritis over the counter med not helping Initial call taken by: Gustavus Bryant,  July 08, 2009 11:36 AM  Follow-up for Phone Call        pt requesting refill for tramadol for arthritis pain--refill sent to pharmacy-pt aware Follow-up by: Maryann Conners CMA,  July 08, 2009 12:13 PM    Prescriptions: TRAMADOL HCL 50 MG TABS (TRAMADOL HCL) 1 by mouth every 6 hr as needed pain  #30 x 0   Entered by:   Maryann Conners CMA   Authorized by:   Tanda Rockers MD   Signed by:   Maryann Conners CMA on 07/08/2009   Method used:   Electronically to        [facility]  [phone]* (retail)       [address]       [place], [place]  [address]       Ph: [phone] or [phone]       Fax: [phone]   RxID:   [id number]

## 2010-11-25 NOTE — Miscellaneous (Signed)
Summary: vit d  Medications Added HYDROCHLOROTHIAZIDE 25 MG TABS (HYDROCHLOROTHIAZIDE)  SPIRONOLACTONE 25 MG TABS (SPIRONOLACTONE)  VITAMIN D 60454 UNIT  CAPS (ERGOCALCIFEROL) one capsule by mouth once a week       Clinical Lists Changes  Medications: Added new medication of HYDROCHLOROTHIAZIDE 25 MG TABS (HYDROCHLOROTHIAZIDE) Added new medication of SPIRONOLACTONE 25 MG TABS (SPIRONOLACTONE) Added new medication of VITAMIN D 09811 UNIT  CAPS (ERGOCALCIFEROL) one capsule by mouth once a week - Signed Rx of VITAMIN D 91478 UNIT  CAPS (ERGOCALCIFEROL) one capsule by mouth once a week;  #12 x 0;  Signed;  Entered by: Tilden Dome;  Authorized by: Tanda Rockers MD;  Method used: Electronic Observations: Added new observation of LLIMPORTMEDS: completed (09/14/2007 16:28)    Prescriptions: VITAMIN D 29562 UNIT  CAPS (ERGOCALCIFEROL) one capsule by mouth once a week  #12 x 0   Entered by:   Tilden Dome   Authorized by:   Tanda Rockers MD   Signed by:   Tilden Dome on 09/14/2007   Method used:   Electronically sent to ...       [facility]  [phone]*       [address]       [place], [place]  [address]       Ph: [phone] or [phone]       Fax: [phone]   RxID:   [id number]

## 2010-11-25 NOTE — Progress Notes (Signed)
Summary: refill- appt pend  Phone Note Call from Patient   Caller: Patient Call For: wert Summary of Call: pt has an appt pend for 9/10. is requesting her refill of hydrochlorothazide.  Initial call taken by: Cooper Render,  June 18, 2008 11:36 AM      Prescriptions: HYDROCHLOROTHIAZIDE 25 MG TABS (HYDROCHLOROTHIAZIDE) Take 1 tablet by mouth once a day  #60 Each x 0   Entered by:   Jim Like   Authorized by:   Tanda Rockers MD   Signed by:   Jim Like on 06/18/2008   Method used:   Electronically to        [facility]  [phone]* (retail)       [address]       [place], [place]  [address]       Ph: [phone] or [phone]       Fax: [phone]   RxID:   [id number]

## 2010-11-25 NOTE — Progress Notes (Signed)
Summary: rx request- pt out  Phone Note Call from Patient   Caller: Patient Call For: wert Summary of Call: pt needs a new rx called in for hydrochlorothiazide. says she takes 2 tabs once daily. says this was dr wert's instructions to her "to take 2 once a day as needed". [facility] [address].  Initial call taken by: Cooper Render,  November 09, 2008 8:53 AM  Follow-up for Phone Call        Spoke with pt and made aware rx was refilled and to take once a day and 1 extra as needed. Follow-up by: Tilden Dome,  November 09, 2008 9:15 AM      Prescriptions: HYDROCHLOROTHIAZIDE 25 MG TABS (HYDROCHLOROTHIAZIDE) Take 1 tablet by mouth once a day and 1 extra as needed  #60 x 6   Entered by:   Tilden Dome   Authorized by:   Tanda Rockers MD   Signed by:   Tilden Dome on 11/09/2008   Method used:   Electronically to        [facility]  [phone]* (retail)       [address]       [place], [place]  [address]       Ph: [phone] or [phone]       Fax: [phone]   RxID:   [id number]

## 2010-11-25 NOTE — Progress Notes (Signed)
Summary: ok for order for Mammogram @ [facility]  Phone Note Call from Patient Call back at [facility] Phone [phone]   Caller: Patient Call For: wert Reason for Call: Talk to Nurse Summary of Call: need an order for a mammogram sent to [facility] [phone] Initial call taken by: Zigmund Gottron,  June 23, 2010 4:56 PM  Follow-up for Phone Call        Send to Dr [name]- his patient Follow-up by: [name] MD,  June 23, 2010 9:52 PM  Additional Follow-up for Phone Call Additional follow up Details #1::        ok with me Additional Follow-up by: Tanda Rockers MD,  June 24, 2010 9:14 AM    Additional Follow-up for Phone Call Additional follow up Details #2::    [id number].  Jinny Blossom Reynolds LPN  August 30, 624THL 9:32 AM   patient returned call.  per pt this is a routine mammogram - she usually gets it done at the [facility] but insurance is requiring that it be done at [facility].  order faxed to the verified fax number above with a request to send the results to Dr. [name].  Order sent to be scanned into EMR. Parke Poisson CNA/MA  June 24, 2010 9:51 AM

## 2010-11-25 NOTE — Assessment & Plan Note (Signed)
Summary: med calendar/ mbw   Primary Provider/Referring Provider:  [name]  CC:  med cal.  History of Present Illness: 68 yobf never smoker  with morbid obesity complicated by hypertension and tendency to fluid retention controlled on spironolactone and hydrochlorothiazide daily.  Hx of intermittent leg swelling with negative venous Dopplers in the past.  Labs 11/08 showed low vitamin d level at 20, started on vitamin D 50k qwk, vit d level normalized  06/26/08 ov no new co's, leg swelling better. occ HB without dysphagia.  October 30, 2008 cpx co increaese in left foot  swelling when standing alot otherwise no co's. Podiatrist want to operate on it. no pain,   walking ok.   No tia, claudiation symptoms or cp/sob  January 01, 2009-r work in visit c/o  bilat hip pain toward the buttock area, also c/o increased edema in lower left leg and a "crawling" sensation in the right groin area.  denies change in color of urine, odor or pain with urination, ext weakness, chest pain, dyspnea. Has chronic venous insuff. prev ven dopp neg. no leg redness, or fever. meds not helping. =aleve/robaxin.   01/15/09 ov no better, pain both hips but worse on right rad to post right without numbness, no back pain or change in bowel or bladder habits. no fever. rec ortho eval rec muphy/wainer and rec short cycle of prednisone.  Prednsione did help transiently Ended up seeing Dr Evelina Bucy and Maureen Ralphs separately,  rec aleve/ tylenol not effective - no f/u pain   November  4, Followup for tramadol refill.  Pt states that she needs this med for arthritis pain in back.  only using tramadol once a day with back pain, no radiation down leg or numbness/ weakness.    September 30, 2009 --Presents for follow up and med review. Pt brought all her meds today, we updated her med calendar. She is doing well. Does have arthritic pain in hands and back , tramadol helps.  She does alot of baking, really enjoying this. Denies chest pain, dyspnea,  orthopnea, hemoptysis, fever, n/v/d, edema, headache.  Medications Prior to Update: 1)  Super Multiple   Caps (Multiple Vitamins-Minerals) .... Take 1 Tablet By Mouth Once A Day 2)  Glucosamine-Chondroitin 500-400 Mg  Tabs (Glucosamine-Chondroitin) .... Take 1 Tablet By Mouth Once A Day 3)  Calcium-Magnesium 300-300 Mg  Tabs (Calcium-Magnesium) .... 2 Tabs By Mouth Once Daily 4)  Hydrochlorothiazide 25 Mg Tabs (Hydrochlorothiazide) .... Take 1 Tablet By Mouth Once A Day and 1 Extra As Needed 5)  Spironolactone 25 Mg Tabs (Spironolactone) .... 2 Tabs By Mouth Once Daily 6)  Bayer Aspirin Ec Low Dose 81 Mg  Tbec (Aspirin) .... Take 1 Tablet By Mouth Once A Day 7)  Delsym 30 Mg/18ml  Lqcr (Dextromethorphan Polistirex) .... 2 Tsp Two Times A Day As Needed 8)  Robaxin 500 Mg  Tabs (Methocarbamol) .[name].. 1 Tab Every 8 Hours As Needed 9)  Metamucil 48.57 % Powd (Psyllium) .[name].. 1 Tablespoon At Bedtime 10)  Hydrochlorothiazide 25 Mg Tabs (Hydrochlorothiazide) .[name].. 1 Extra Daily As Needed 11)  Maalox Plus 225-200-25 Mg/9ml Susp (Alum & Mag Hydroxide-Simeth) .... As Needed For Heart Burn 12)  Tramadol Hcl 50 Mg Tabs (Tramadol Hcl) .[name].. 1 By Mouth Every 6 Hr As Needed Pain 13)  Aleve 220 Mg Tabs (Naproxen Sodium) .... 2 Two Times A Day As Needed  Current Medications (verified): 1)  Super Multiple   Caps (Multiple Vitamins-Minerals) .... Take 1 Tablet By  Mouth Once A Day 2)  Glucosamine-Chondroitin 500-400 Mg  Tabs (Glucosamine-Chondroitin) .... Take 1 Tablet By Mouth Once A Day 3)  Calcium-Magnesium 300-300 Mg  Tabs (Calcium-Magnesium) .... 2 Tabs By Mouth Once Daily 4)  Hydrochlorothiazide 25 Mg Tabs (Hydrochlorothiazide) .... Take 1 Tablet By Mouth Once A Day and 1 Extra As Needed 5)  Spironolactone 25 Mg Tabs (Spironolactone) .... 2 Tabs By Mouth Once Daily 6)  Bayer Aspirin Ec Low Dose 81 Mg  Tbec (Aspirin) .... Take 1 Tablet By Mouth Once A Day 7)  Delsym 30 Mg/81ml  Lqcr (Dextromethorphan Polistirex)  .... 2 Tsp Two Times A Day As Needed 8)  Robaxin 500 Mg  Tabs (Methocarbamol) .[name].. 1 Tab Every 8 Hours As Needed 9)  Metamucil 48.57 % Powd (Psyllium) .[name].. 1 Tablespoon At Bedtime 10)  Hydrochlorothiazide 25 Mg Tabs (Hydrochlorothiazide) .[name].. 1 Extra Daily As Needed 11)  Maalox Plus 225-200-25 Mg/76ml Susp (Alum & Mag Hydroxide-Simeth) .... As Needed For Heart Burn 12)  Tramadol Hcl 50 Mg Tabs (Tramadol Hcl) .[name].. 1 By Mouth Every 6 Hr As Needed Pain 13)  Aleve 220 Mg Tabs (Naproxen Sodium) .... 2 Two Times A Day As Needed  Allergies: 1)  ! Iron  Comments:  Nurse/Medical Assistant: The patient's medications and allergies were reviewed with the patient and were updated in the Medication and Allergy Lists.  Past History:  Past Medical History: Last updated: 08/29/2009 Colon polyps     - colonoscopy 04/08/2005 CHRONIC RHINITIS (ICD-472.0)     - tried immunotherapy 9/00 MORBID OBESITY (ICD-278.01)    - Target wt  =  163 for BMI < 30  DEGENERATIVE JOINT DISEASE (ICD-715.90).....................Marland KitchenMarland KitchenAlusio/ [place] [facility] HYPERTENSION (ICD-401.9)  [place] 11/08      - Pneumovax 11/04      - CPX October 30, 2008  [facility]N Care.................................................................................[place]       - Bone density/mammography per gyn  Past Surgical History: Last updated: 10/30/2008 Cataract durgery (rt eye) 2009 Hysterectomy 1980 Right TKR 2002 Left TKR 2003  Family History: Last updated: 10/30/2008  Positive for heart disease in remote members of her family only.  No breast or colon cancer in her family to her knowledge.  Her brother was a smoker had lung cancer.   Review of Systems      See HPI  Vital Signs:  Patient profile:   75 year old female Height:      62.5 inches Weight:      165.8 pounds BMI:     29.95 O2 Sat:      96 % on Room air Temp:     97.4 degrees F oral Pulse rate:   74 / minute BP sitting:   132 / 70  (right  arm) Cuff size:   regular  Vitals Entered By: Ramiro Harvest CMA (September 30, 2009 2:03 PM)  O2 Sat at Rest %:  96 O2 Flow:  Room air CC: med cal Pain Assessment Patient in pain? no      Comments Medications reviewed with patient  Ramiro Harvest [facility]  September 30, 2009 2:14 PM    Physical Exam  Additional Exam:  wt  165 October 30, 2008 >> 165 January 01, 2009 >  166 23/23/10 >  162 August 29, 2009 >>165 September 30, 2009  Ambulatory healthy appearing in no acute distress,  HEENT: nl dentition, turbinates, and orophanx. Nl external ear canals without cough reflex Neck without  JVD/Nodes/TM Lungs clear to A and P bilaterally without cough on insp or exp maneuvers RRR no s3 or murmur or increase in P2 Abd soft and benign with nl excursion in the supine position. No bruits or organomegaly Ext warm without calf tenderness, cyanosis clubbing,neg homan sign Skin warm and dry without lesions   MS  pulses sym and intact, equal strength,   nml gait. , arthritic changes in hands.    Impression & Recommendations:  Problem # 1:  HYPERTENSION (ICD-401.9)  Controlled on rx  Meds reviewed with pt education and computerized med calendar completed/adjusted.     Her updated medication list for this problem includes:    Hydrochlorothiazide 25 Mg Tabs (Hydrochlorothiazide) .[name]... Take 1 tablet by mouth once a day and 1 extra as needed    Spironolactone 25 Mg Tabs (Spironolactone) .[name]... 2 tabs by mouth once daily    Hydrochlorothiazide 25 Mg Tabs (Hydrochlorothiazide) .[name]... 1 extra daily as needed  BP today: 132/70 Prior BP: 130/78 (08/29/2009)  Labs Reviewed: K+: 4.1 (10/30/2008) Creat: : 1.4 (10/30/2008)   Chol: 177 (10/30/2008)   HDL: 55.8 (10/30/2008)   LDL: 107 (10/30/2008)   TG: 71 (10/30/2008)  Orders: Est. Patient Level III [id number])  Problem # 2:  DEGENERATIVE JOINT DISEASE (ICD-715.90)  controlled on pain meds.   Her updated medication list for this problem includes:     Bayer Aspirin Ec Low Dose 81 Mg Tbec (Aspirin) .[name]... Take 1 tablet by mouth once a day    Tramadol Hcl 50 Mg Tabs (Tramadol hcl) .[name]... 1 by mouth every 6 hr as needed pain    Aleve 220 Mg Tabs (Naproxen sodium) .[name]... 2 two times a day as needed  Orders: Est. Patient Level III [id number])  Complete Medication List: 1)  Super Multiple Caps (Multiple vitamins-minerals) .... Take 1 tablet by mouth once a day 2)  Glucosamine-chondroitin 500-400 Mg Tabs (Glucosamine-chondroitin) .... Take 1 tablet by mouth once a day 3)  Calcium-magnesium 300-300 Mg Tabs (Calcium-magnesium) .... 2 tabs by mouth once daily 4)  Hydrochlorothiazide 25 Mg Tabs (Hydrochlorothiazide) .... Take 1 tablet by mouth once a day and 1 extra as needed 5)  Spironolactone 25 Mg Tabs (Spironolactone) .... 2 tabs by mouth once daily 6)  Bayer Aspirin Ec Low Dose 81 Mg Tbec (Aspirin) .... Take 1 tablet by mouth once a day 7)  Delsym 30 Mg/70ml Lqcr (Dextromethorphan polistirex) .... 2 tsp two times a day as needed 8)  Robaxin 500 Mg Tabs (Methocarbamol) .[name].. 1 tab every 8 hours as needed 9)  Metamucil 48.57 % Powd (Psyllium) .[name].. 1 tablespoon at bedtime 10)  Hydrochlorothiazide 25 Mg Tabs (Hydrochlorothiazide) .[name].. 1 extra daily as needed 11)  Maalox Plus 225-200-25 Mg/34ml Susp (Alum & mag hydroxide-simeth) .... As needed for heart burn 12)  Tramadol Hcl 50 Mg Tabs (Tramadol hcl) .[name].. 1 by mouth every 6 hr as needed pain 13)  Aleve 220 Mg Tabs (Naproxen sodium) .... 2 two times a day as needed  Patient Instructions: 1)  Bring med calendar to each visit.  2)  Continue on same meds.  3)  follow up Dr. [name] for physical in 6 weeks  4)  Please contact office for sooner follow up as needed.   Appended Document: med calendar update Medications Added METAMUCIL SMOOTH TEXTURE 63 % POWD (PSYLLIUM) 1 tablespoon at bedtime CENTRUM SILVER  TABS (MULTIPLE VITAMINS-MINERALS) Take 1 tablet by mouth once a day GLUCOSAMINE-CHONDROITIN 500-400 MG   TABS (GLUCOSAMINE-CHONDROITIN) Take 2 tablets by  mouth once a day CALCIUM CARBONATE-VITAMIN D 600-400 MG-UNIT  TABS (CALCIUM CARBONATE-VITAMIN D) 2 tabs by mouth once daily VITAMIN D3 2000 UNIT CAPS (CHOLECALCIFEROL) Take 1 capsule by mouth once a day HYDROCHLOROTHIAZIDE 25 MG TABS (HYDROCHLOROTHIAZIDE) Take 1 tablet by mouth once a day FISH OIL 1000 MG CAPS (OMEGA-3 FATTY ACIDS) Take 1 capsule by mouth once a day CLARITIN 10 MG TABS (LORATADINE) Take 1 tablet by mouth once a day ALEVE 220 MG TABS (NAPROXEN SODIUM) per bottle TRAMADOL HCL 50 MG TABS (TRAMADOL HCL) 1 by mouth every 4 hr as needed pain MAALOX PLUS 225-200-25 MG/5ML SUSP (ALUM & MAG HYDROXIDE-SIMETH) per bottle          Clinical Lists Changes  Medications: Added new medication of METAMUCIL SMOOTH TEXTURE 63 % POWD (PSYLLIUM) 1 tablespoon at bedtime - Signed Added new medication of CENTRUM SILVER  TABS (MULTIPLE VITAMINS-MINERALS) Take 1 tablet by mouth once a day - Signed Changed medication from GLUCOSAMINE-CHONDROITIN 500-400 MG  TABS (GLUCOSAMINE-CHONDROITIN) Take 1 tablet by mouth once a day to GLUCOSAMINE-CHONDROITIN 500-400 MG  TABS (GLUCOSAMINE-CHONDROITIN) Take 2 tablets by mouth once a day - Signed Added new medication of CALCIUM CARBONATE-VITAMIN D 600-400 MG-UNIT  TABS (CALCIUM CARBONATE-VITAMIN D) 2 tabs by mouth once daily - Signed Removed medication of SUPER MULTIPLE   CAPS (MULTIPLE VITAMINS-MINERALS) Take 1 tablet by mouth once a day - Signed Removed medication of CALCIUM-MAGNESIUM 300-300 MG  TABS (CALCIUM-MAGNESIUM) 2 tabs by mouth once daily - Signed Added new medication of VITAMIN D3 2000 UNIT CAPS (CHOLECALCIFEROL) Take 1 capsule by mouth once a day - Signed Changed medication from HYDROCHLOROTHIAZIDE 25 MG TABS (HYDROCHLOROTHIAZIDE) Take 1 tablet by mouth once a day and 1 extra as needed to HYDROCHLOROTHIAZIDE 25 MG TABS (HYDROCHLOROTHIAZIDE) Take 1 tablet by mouth once a day - Signed Added new medication  of FISH OIL 1000 MG CAPS (OMEGA-3 FATTY ACIDS) Take 1 capsule by mouth once a day - Signed Added new medication of CLARITIN 10 MG TABS (LORATADINE) Take 1 tablet by mouth once a day - Signed Changed medication from ALEVE 220 MG TABS (NAPROXEN SODIUM) 2 two times a day as needed to ALEVE 220 MG TABS (NAPROXEN SODIUM) per bottle - Signed Changed medication from TRAMADOL HCL 50 MG TABS (TRAMADOL HCL) 1 by mouth every 6 hr as needed pain to TRAMADOL HCL 50 MG TABS (TRAMADOL HCL) 1 by mouth every 4 hr as needed pain - Signed Changed medication from [place] 225-200-25 MG/5ML SUSP (ALUM & MAG HYDROXIDE-SIMETH) as needed for heart burn to MAALOX PLUS 225-200-25 MG/5ML SUSP (ALUM & MAG HYDROXIDE-SIMETH) per bottle Removed medication of ROBAXIN 500 MG  TABS (METHOCARBAMOL) 1 tab every 8 hours as needed Removed medication of METAMUCIL 48.57 % POWD (PSYLLIUM) 1 tablespoon at bedtime Removed medication of HYDROCHLOROTHIAZIDE 25 MG TABS (HYDROCHLOROTHIAZIDE) 1 extra daily as needed

## 2010-11-25 NOTE — Assessment & Plan Note (Signed)
Summary: [name]   Chief Complaint:  follow up - no complaints.  History of Present Illness: 75 year old very pleasant  black female with morbid obesity complicated by hypertension and tendency to fluid retention that has improved on a combination of spironolactone and hydrochlorothiazide daily.  She has had intermittent leg swelling with negative venous Dopplers in the past that has also been well controlled on this combination.  Recent blood work in 11/08 showed low vitamin d level at 36, started on vitamin D 50k qwk, returns today for 13month follow up. No complaints. Denies chest pain, dyspnea, orthopnea, hemoptysis, fever, n/v/d, edema, recent travel or antibiotics.  Has BMD scheduled next month by GYN.       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     Review of Systems      See HPI   Vital Signs:  Patient Profile:   75 Years Old Female Weight:      165 pounds O2 Sat:      98 % O2 treatment:    Room Air Temp:     97.4 degrees F oral Pulse rate:   65 / minute BP sitting:   112 / 70  (left arm) Cuff size:   regular  Vitals Entered By: Parke Poisson CNA (December 19, 2007 10:45 AM)             Is Patient Diabetic? No Comments Medications reviewed with patient ..................................................................Marland KitchenParke Poisson CNA  December 19, 2007 10:45 AM      Physical Exam  No acute distress. VSS.  HEENT: unremarkable. Oropharynx is clear. TM nml, EAC clear Neck: supple, no adenopathy, or JVD Lungs: CTA bilat. no wheezing/crackles Card: r/r/r, no m/r/g Abd: soft, nontender Ext: warm w/o calf tenderness,  cyanosis clubbing, or edema.       Impression & Recommendations:  Problem # 1:  HYPERTENSION (ICD-401.9) Well controlled. Continue same regimen. follow up 4-6 months and as needed  Her updated medication list for this problem includes:    Hydrochlorothiazide 25 Mg Tabs (Hydrochlorothiazide) .[name]... Take 1 tablet by mouth once a day    Spironolactone 25 Mg Tabs (Spironolactone) .[name]... 2 tabs by mouth once daily  Orders: Est. Patient Level III [id number])   Problem # 2:  VITAMIN D DEFICIENCY (ICD-268.9) Check level today. Begin [place] D two times a day. follow up BMD as scheduled.  Orders: T-Vitamin D (25-Hydroxy) [phone]) Est. Patient Level III [id number])   Medications Added to Medication List This Visit: 1)  Hydrochlorothiazide 25 Mg Tabs (Hydrochlorothiazide) .... Take 1 tablet by mouth once a day 2)  Spironolactone 25 Mg Tabs (Spironolactone) .... 2 tabs by mouth once daily   Patient Instructions: 1)  Begin Oscal D two times a day  2)  continue present regimen.  3)  Please contact office for sooner follow up if symptoms do not improve or worsen  4)  follow up Dr. [name] in 4-6 months and as needed     ]

## 2010-11-25 NOTE — Progress Notes (Signed)
Summary: copy of med calendar   Phone Note Call from Patient Call back at [facility] Phone [phone]   Caller: Patient Call For: wert Summary of Call: pt called re: getting a copy of the "yellow piece of paper given to her at last visit w/ tp (maybe med calendar). pt says she needs this mailed to her since she lost it.  Initial call taken by: Cooper Render, CNA,  February 18, 2010 12:56 PM  Follow-up for Phone Call        TP, can you print off pt's med calendar for when pt saw you on 09-30-2009.  Thank you.  Jinny Blossom Reynolds LPN  April 26, [id number] PM   ** Pt would like med calendar mailed to her home address.    Additional Follow-up for Phone Call Additional follow up Details #1::        ok Additional Follow-up by: Rexene Edison NP,  February 19, 2010 11:11 AM    Additional Follow-up for Phone Call Additional follow up Details #2::    med calander mailed to pt's home address.  LMOMTCB to inform pt of this.  Raymondo Band RN  February 19, 2010 11:18 AM  pt advised. Chualar Bing CMA  February 19, 2010 11:27 AM

## 2010-11-25 NOTE — Assessment & Plan Note (Signed)
Summary: Primary svc/ fu   PCP:  [name]  Chief Complaint:  6 month followup.  Pt c/o heart burn x 1 month.  Also c/o dry cough she relates to seasonal allergies.  .  History of Present Illness: 75 year old very pleasant  black female with morbid obesity complicated by hypertension and tendency to fluid retention that has improved on a combination of spironolactone and hydrochlorothiazide daily.  She has had intermittent leg swelling with negative venous Dopplers in the past that has also been well controlled on this combination.   Labs 11/08 showed low vitamin d level at 20, started on vitamin D 50k qwk, vit d level normalized  06/26/08 ov no new co's, leg swelling better. occ HB without dysphagia  Pt denies any significant sore throat, nasal congestion or excess secretions, fever, chills, sweats, unintended wt loss, pleuritic or exertional cp, orthopnea pnd or leg swelling.  Pt also denies any obvious fluctuation in symptoms with weather or environmental change or other alleviating or aggravating factors.           Updated Prior Medication List: METAMUCIL 30.9 %  POWD (PSYLLIUM) 1 tbsp once daily SUPER MULTIPLE   CAPS (MULTIPLE VITAMINS-MINERALS) Take 1 tablet by mouth once a day GLUCOSAMINE-CHONDROITIN 500-400 MG  TABS (GLUCOSAMINE-CHONDROITIN) Take 1 tablet by mouth once a day CALCIUM-MAGNESIUM 300-300 MG  TABS (CALCIUM-MAGNESIUM) 2 tabs by mouth once daily FLAX SEEDS   POWD (FLAXSEED (LINSEED)) 2 tbsp once daily HYDROCHLOROTHIAZIDE 25 MG TABS (HYDROCHLOROTHIAZIDE) Take 1 tablet by mouth once a day and 1 extra as needed SPIRONOLACTONE 25 MG TABS (SPIRONOLACTONE) 2 tabs by mouth once daily BAYER ASPIRIN EC LOW DOSE 81 MG  TBEC (ASPIRIN) Take 1 tablet by mouth once a day CLARITIN 10 MG  TABS (LORATADINE) Take 1 tablet by mouth once a day as needed ALEVE 220 MG  TABS (NAPROXEN SODIUM) per bottle * QUININE 260mg  1 at bedtime as needed DELSYM 30 MG/5ML  LQCR (DEXTROMETHORPHAN  POLISTIREX) 2 tsp two times a day as needed ROBAXIN 500 MG  TABS (METHOCARBAMOL) 1 tab every 8 hours as needed  Current Allergies (reviewed today): No known allergies   Past Medical History:    Reviewed history from 12/19/2007 and no changes required:              RECTAL BLEEDING (ICD-569.3)       CHRONIC RHINITIS (ICD-472.0)       MORBID OBESITY (ICD-278.01)       DEGENERATIVE JOINT DISEASE (ICD-715.90)       HYPERTENSION (ICD-401.9)            Risk Factors:  Tobacco use:  never    Vital Signs:  Patient Profile:   75 Years Old Female Weight:      167 pounds O2 Sat:      96 % O2 treatment:    Room Air Temp:     98.2 degrees F oral Pulse rate:   74 / minute BP sitting:   140 / 78  (left arm)  Vitals Entered By: Tilden Dome (July 05, 2008 10:40 AM)                 Physical Exam  wt 165 -> 167 Ambulatory healthy appearing in no acute distress. Afeb with normal vital signs HEENT: nl dentition, turbinates, and orophanx. Nl external ear canals without cough reflex Neck without JVD/Nodes/TM Lungs clear to A and P bilaterally without cough on insp or exp maneuvers RRR no s3 or murmur or increase in  P2 Abd soft and benign with nl excursion in the supine position. No bruits or organomegaly Ext warm without calf tenderness, cyanosis clubbing or edema Skin warm and dry without lesions        Impression & Recommendations:  Problem # 1:  MORBID OBESITY (ICD-278.01) wt treding up again   Weight control is a matter of calorie balance which needs to be tilted in the pt's favor by eating less and exercising more.  Specifically, I recommended  exercise at a level where pt  is short of breath but not out of breath 30 minutes daily.  If not losing weight on this program, I would strongly recommend pt see a nutritionist with a food diary recorded for two weeks prior to the visit.    Orders: Est. Patient Level III [id number])   Problem # 2:  HYPERTENSION  (ICD-401.9) adequate on present rx Her updated medication list for this problem includes:    Hydrochlorothiazide 25 Mg Tabs (Hydrochlorothiazide) .[name]... Take 1 tablet by mouth once a day and 1 extra as needed    Spironolactone 25 Mg Tabs (Spironolactone) .[name]... 2 tabs by mouth once daily  Orders: Est. Patient Level III [id number])   Problem # 3:  GASTROESOPHAGEAL REFLUX DISEASE (ICD-530.81) clinical dx only, rx with diet and prns Orders: Est. Patient Level III [id number])   Medications Added to Medication List This Visit: 1)  Hydrochlorothiazide 25 Mg Tabs (Hydrochlorothiazide) .... Take 1 tablet by mouth once a day and 1 extra as needed  Complete Medication List: 1)  Metamucil 30.9 % Powd (Psyllium) .[name].. 1 tbsp once daily 2)  Super Multiple Caps (Multiple vitamins-minerals) .... Take 1 tablet by mouth once a day 3)  Glucosamine-chondroitin 500-400 Mg Tabs (Glucosamine-chondroitin) .... Take 1 tablet by mouth once a day 4)  Calcium-magnesium 300-300 Mg Tabs (Calcium-magnesium) .... 2 tabs by mouth once daily 5)  Flax Seeds Powd (Flaxseed (linseed)) .... 2 tbsp once daily 6)  Hydrochlorothiazide 25 Mg Tabs (Hydrochlorothiazide) .... Take 1 tablet by mouth once a day and 1 extra as needed 7)  Spironolactone 25 Mg Tabs (Spironolactone) .... 2 tabs by mouth once daily 8)  Bayer Aspirin Ec Low Dose 81 Mg Tbec (Aspirin) .... Take 1 tablet by mouth once a day 9)  Claritin 10 Mg Tabs (Loratadine) .... Take 1 tablet by mouth once a day as needed 10)  Aleve 220 Mg Tabs (Naproxen sodium) .... Per bottle 11)  Quinine  .... 260mg  1 at bedtime as needed 12)  Delsym 30 Mg/70ml Lqcr (Dextromethorphan polistirex) .... 2 tsp two times a day as needed 13)  Robaxin 500 Mg Tabs (Methocarbamol) .[name].. 1 tab every 8 hours as needed   Patient Instructions: 1)  Please schedule a follow-up appointment in 3 months for CPX 2)  Try maalox plus or gaviscon liquid as needed for heartburn(gastroesophageal reflux disease)  was discussed. It is a common cause of respiratory symptoms. It commonly presents in the absence of heartburn. GERD can be treated with medication, but also with lifestyle changes including avoidance of late meals, excessive alcohol, smoking cessation, and avoid fatty foods, chocolate, peppermint, colas, red wine, and acidic juices such as orange juice. NO MINT OR MENTHOL PRODUCTS 3)  USE SUGARLESS CANDY INSTEAD (jolley ranchers)    ]

## 2010-11-25 NOTE — Assessment & Plan Note (Signed)
Summary: Primary svc/ ? radicular right leg pain, rx pred > otho   Primary Provider/Referring Provider:  [name]  CC:  Acute visit. Pt c/o arhritis pain no better.  States that pain is in her lower back and now radiating down her legs.  She states that she still stays active and her pain "comes and goes". .  History of Present Illness: 36 yobf never smoker  with morbid obesity complicated by hypertension and tendency to fluid retention controlled on spironolactone and hydrochlorothiazide daily.  Hx of intermittent leg swelling with negative venous Dopplers in the past.  Labs 11/08 showed low vitamin d level at 20, started on vitamin D 50k qwk, vit d level normalized  06/26/08 ov no new co's, leg swelling better. occ HB without dysphagia.  October 30, 2008 cpx co increaese in left foot  swelling when standing alot otherwise no co's. Podiatrist want to operate on it. no pain,   walking ok.   No tia, claudiation symptoms or cp/sob  January 01, 2009-r work in visit c/o  bilat hip pain toward the buttock area, also c/o increased edema in lower left leg and a "crawling" sensation in the right groin area.  denies change in color of urine, odor or pain with urination, ext weakness, chest pain, dyspnea. Has chronic venous insuff. prev ven dopp neg. no leg redness, or fever. meds not helping. =aleve/robaxin. Only taking every few days. Has had symptoms since 2 weeks, no known injury.   01/15/09 ov no better, pain both hips but worse on right rad to post right without numbness, no back pain or change in bowel or bladder habits. no fever.  Current Medications (verified): 1)  Super Multiple   Caps (Multiple Vitamins-Minerals) .... Take 1 Tablet By Mouth Once A Day 2)  Glucosamine-Chondroitin 500-400 Mg  Tabs (Glucosamine-Chondroitin) .... Take 1 Tablet By Mouth Once A Day 3)  Calcium-Magnesium 300-300 Mg  Tabs (Calcium-Magnesium) .... 2 Tabs By Mouth Once Daily 4)  Hydrochlorothiazide 25 Mg Tabs  (Hydrochlorothiazide) .... Take 1 Tablet By Mouth Once A Day and 1 Extra As Needed 5)  Spironolactone 25 Mg Tabs (Spironolactone) .... 2 Tabs By Mouth Once Daily 6)  Bayer Aspirin Ec Low Dose 81 Mg  Tbec (Aspirin) .... Take 1 Tablet By Mouth Once A Day 7)  Claritin 10 Mg  Tabs (Loratadine) .... Take 1 Tablet By Mouth Once A Day As Needed 8)  Aleve 220 Mg  Tabs (Naproxen Sodium) .... Per Bottle 9)  Quinine .... 260mg  1 At Bedtime As Needed 10)  Delsym 30 Mg/48ml  Lqcr (Dextromethorphan Polistirex) .... 2 Tsp Two Times A Day As Needed 11)  Robaxin 500 Mg  Tabs (Methocarbamol) .[name].. 1 Tab Every 8 Hours As Needed 12)  Metamucil 48.57 % Powd (Psyllium) .[name].. 1 Tablespoon At Bedtime 13)  Flax Seeds  Powd (Flaxseed (Linseed)) .... 2 Tablespoons Once Daily 14)  Hydrochlorothiazide 25 Mg Tabs (Hydrochlorothiazide) .[name].. 1 Extra Daily As Needed 15)  Maalox Plus 225-200-25 Mg/20ml Susp (Alum & Mag Hydroxide-Simeth) .... As Needed For Heart Burn 16)  Tramadol Hcl 50 Mg Tabs (Tramadol Hcl) .[name].. 1 By Mouth Every 6 Hr As Needed Pain 17)  Aleve 220 Mg Tabs (Naproxen Sodium) .... 2 Two Times A Day As Needed  Allergies (verified): 1)  ! Iron  Past History:  Past Medical History:    Colon polyps        - colonoscopy 04/08/2005    CHRONIC RHINITIS (ICD-472.0)        -  tried immunotherapy 9/00    MORBID OBESITY (ICD-278.01)       - Target wt  =  163 for BMI < 30     DEGENERATIVE JOINT DISEASE (ICD-715.90)    HYPERTENSION (ICD-401.9)     [place] 11/08         - Pneumovax 11/04         - CPX October 30, 2008     GYN Care.................................................................................[facility]          - Bone density/mammography per gyn  Vital Signs:  Patient profile:   75 year old female Weight:      166 pounds Temp:     97.7 degrees F oral Pulse rate:   73 / minute BP sitting:   124 / 70  (left arm)  Vitals Entered By: Tilden Dome (January 15, 2009 3:24 PM)  O2  Sat on room air at rest %:  98  Physical Exam  Additional Exam:  wt  165 October 30, 2008 >>165 January 01, 2009 > 166 23/23/10  Ambulatory healthy appearing in no acute distress, very anxious, not answering questions in straightforward manner  Afeb with normal vital signs HEENT: nl dentition, turbinates, and orophanx. Nl external ear canals without cough reflex Neck without JVD/Nodes/TM Lungs clear to A and P bilaterally without cough on insp or exp maneuvers RRR no s3 or murmur or increase in P2 Abd soft and benign with nl excursion in the supine position. No bruits or organomegaly Ext warm without calf tenderness, cyanosis clubbing,neg homan sign Skin warm and dry without lesions   MS   , pulses sym and intact, tenderness along lumbar/sacral, equal strenght, no rash, neg slr, nml gait, neg cva tenderness.    Impression & Recommendations:  Problem # 1:  BACK PAIN (ICD-724.5) Very atypical features not responding to nsaids with nothing that really appears obviously radicular, has seen Percell Miller previously so will refer there.  Problem # 2:  HYPERTENSION (ICD-401.9)  Her updated medication list for this problem includes:    Hydrochlorothiazide 25 Mg Tabs (Hydrochlorothiazide) .[name]... Take 1 tablet by mouth once a day and 1 extra as needed    Spironolactone 25 Mg Tabs (Spironolactone) .[name]... 2 tabs by mouth once daily    Hydrochlorothiazide 25 Mg Tabs (Hydrochlorothiazide) .[name]... 1 extra daily as needed  Complete Medication List: 1)  Super Multiple Caps (Multiple vitamins-minerals) .... Take 1 tablet by mouth once a day 2)  Glucosamine-chondroitin 500-400 Mg Tabs (Glucosamine-chondroitin) .... Take 1 tablet by mouth once a day 3)  Calcium-magnesium 300-300 Mg Tabs (Calcium-magnesium) .... 2 tabs by mouth once daily 4)  Hydrochlorothiazide 25 Mg Tabs (Hydrochlorothiazide) .... Take 1 tablet by mouth once a day and 1 extra as needed 5)  Spironolactone 25 Mg Tabs (Spironolactone) .... 2 tabs by  mouth once daily 6)  Bayer Aspirin Ec Low Dose 81 Mg Tbec (Aspirin) .... Take 1 tablet by mouth once a day 7)  Claritin 10 Mg Tabs (Loratadine) .... Take 1 tablet by mouth once a day as needed 8)  Aleve 220 Mg Tabs (Naproxen sodium) .... Per bottle 9)  Quinine  .... 260mg  1 at bedtime as needed 10)  Delsym 30 Mg/81ml Lqcr (Dextromethorphan polistirex) .... 2 tsp two times a day as needed 11)  Robaxin 500 Mg Tabs (Methocarbamol) .[name].. 1 tab every 8 hours as needed 12)  Metamucil 48.57 % Powd (Psyllium) .[name].. 1 tablespoon at bedtime 13)  Flax Seeds Powd (Flaxseed (linseed)) .... 2 tablespoons once daily 14)  Hydrochlorothiazide 25 Mg Tabs (Hydrochlorothiazide) .[name].. 1 extra daily as needed 15)  Maalox Plus 225-200-25 Mg/85ml Susp (Alum & mag hydroxide-simeth) .... As needed for heart burn 16)  Tramadol Hcl 50 Mg Tabs (Tramadol hcl) .[name].. 1 by mouth every 6 hr as needed pain 17)  Aleve 220 Mg Tabs (Naproxen sodium) .... 2 two times a day as needed 18)  Prednisone 10 Mg Tabs (Prednisone) .... 4 each am x 2days, 2x2days, 1x2days and stop  Other Orders: Orthopedic Referral (Ortho) Est. Patient Level III [id number])  Patient Instructions: 1)  See Patient Care Coordinator before leaving for orthopedic evaluation murphy/ wainer  2)  in meantime try Prednisone 10 mg 4 each am x 2days, 2x2days, 1x2days and stop  Prescriptions: PREDNISONE 10 MG  TABS (PREDNISONE) 4 each am x 2days, 2x2days, 1x2days and stop  #14 x 0   Entered and Authorized by:   Tanda Rockers MD   Signed by:   Tanda Rockers MD on 01/15/2009   Method used:   Electronically to        [facility]  [phone]* (retail)       [address]       [place], [place]  [address]       Ph: [phone] or [phone]       Fax: [phone]   RxID:   [id number]

## 2010-11-25 NOTE — Progress Notes (Signed)
Summary: dosage  Phone Note Call from Patient   Caller: Patient Call For: Carlena Ruybal Summary of Call: have questions about dosage for hydrochlortot 25mg  Initial call taken by: Gustavus Bryant,  June 17, 2009 1:15 PM  Follow-up for Phone Call        [email or web address] Ramiro [facility]  June 17, 2009 1:49 PM   Additional Follow-up for Phone Call Additional follow up Details #1::        clarified with pt to take HCTZ once daily, and take one extra tablet daily as needed. Pt stated understanding.  Additional Follow-up by:  Bing CMA,  June 17, 2009 2:21 PM

## 2010-11-25 NOTE — Assessment & Plan Note (Signed)
Summary: flu shot/apc     Flu Vaccine Consent Questions     Do you have a history of severe allergic reactions to this vaccine? no    Any prior history of allergic reactions to egg and/or gelatin? no    Do you have a sensitivity to the preservative Thimersol? no    Do you have a past history of Guillan-Barre Syndrome? no    Do you currently have an acute febrile illness? no    Have you ever had a severe reaction to latex? no    Vaccine information given and explained to patient? yes    Are you currently pregnant? no    Lot Number:[id number]   Site Given  Left Deltoid IM   Given by Alroy Bailiff in allergy lab. Clayborne Dana CMA  July 30, 2008 9:53 AM                          Nurse Visit    Prior Medications: METAMUCIL 30.9 %  POWD (PSYLLIUM) 1 tbsp once daily SUPER MULTIPLE   CAPS (MULTIPLE VITAMINS-MINERALS) Take 1 tablet by mouth once a day GLUCOSAMINE-CHONDROITIN 500-400 MG  TABS (GLUCOSAMINE-CHONDROITIN) Take 1 tablet by mouth once a day CALCIUM-MAGNESIUM 300-300 MG  TABS (CALCIUM-MAGNESIUM) 2 tabs by mouth once daily FLAX SEEDS   POWD (FLAXSEED (LINSEED)) 2 tbsp once daily HYDROCHLOROTHIAZIDE 25 MG TABS (HYDROCHLOROTHIAZIDE) Take 1 tablet by mouth once a day and 1 extra as needed SPIRONOLACTONE 25 MG TABS (SPIRONOLACTONE) 2 tabs by mouth once daily BAYER ASPIRIN EC LOW DOSE 81 MG  TBEC (ASPIRIN) Take 1 tablet by mouth once a day CLARITIN 10 MG  TABS (LORATADINE) Take 1 tablet by mouth once a day as needed ALEVE 220 MG  TABS (NAPROXEN SODIUM) per bottle QUININE () 260mg  1 at bedtime as needed DELSYM 30 MG/5ML  LQCR (DEXTROMETHORPHAN POLISTIREX) 2 tsp two times a day as needed ROBAXIN 500 MG  TABS (METHOCARBAMOL) 1 tab every 8 hours as needed Current Allergies: No known allergies     Orders Added: 1)  Flu Vaccine 60yrs + [id number] 2)  Administration Flu vaccine [id number]    ]

## 2010-11-25 NOTE — Medication Information (Signed)
Summary: Spironolactone / Right Source  Spironolactone / Right Source   Imported By: Rise Patience 07/02/2010 11:50:38  _____________________________________________________________________  External Attachment:    Type:   Image     Comment:   External Document

## 2010-11-25 NOTE — Assessment & Plan Note (Signed)
Summary: Primary svc/ f/u back pain   Primary Provider/Referring Provider:  [name]  CC:  6 wk followup.  Pt states that she is doing well and denies any complaints today.[name]  History of Present Illness: 75 yobf never smoker  with morbid obesity complicated by hypertension and tendency to fluid retention controlled on spironolactone and hydrochlorothiazide daily.  Hx of intermittent leg swelling with negative venous Dopplers in the past.  Labs 11/08 showed low vitamin d level at 20, started on vitamin D 50k qwk, vit d level normalized  06/26/08 ov no new co's, leg swelling better. occ HB without dysphagia.  October 30, 2008 cpx co increaese in left foot  swelling when standing alot otherwise no co's. Podiatrist want to operate on it. no pain,   walking ok.   No tia, claudiation symptoms or cp/sob  January 01, 2009-r work in visit c/o  bilat hip pain toward the buttock area, also c/o increased edema in lower left leg and a "crawling" sensation in the right groin area.  denies change in color of urine, odor or pain with urination, ext weakness, chest pain, dyspnea. Has chronic venous insuff. prev ven dopp neg. no leg redness, or fever. meds not helping. =aleve/robaxin.   01/15/09 ov no better, pain both hips but worse on right rad to post right without numbness, no back pain or change in bowel or bladder habits. no fever. rec ortho eval rec muphy/wainer and rec short cycle of prednisone.  Prednsione did help transiently Ended up seeing Dr Evelina Bucy and Maureen Ralphs separately,  rec aleve/ tylenol not effective - no f/u pain   November  4, Followup for tramadol refill.  Pt states that she needs this med for arthritis pain in back.  only using tramadol once a day with back pain, no radiation down leg or numbness/ weakness.    September 30, 2009 --Presents for follow up and med review. Pt brought all her meds today, we updated her med calendar. She is doing well. Does have arthritic pain in hands and back , tramadol  helps.  She does alot of baking, really enjoying this.   November 18, 2009 6 wk followup.  Pt states that she is doing well and denies any complaints today. Needing tramadol and all her prns less using med calendar consistently.  Current Medications (verified): 1)  Metamucil Smooth Texture 63 % Powd (Psyllium) .[name].. 1 Tablespoon At Bedtime 2)  Centrum Silver  Tabs (Multiple Vitamins-Minerals) .... Take 1 Tablet By Mouth Once A Day 3)  Glucosamine-Chondroitin 500-400 Mg  Tabs (Glucosamine-Chondroitin) .... Take 2 Tablets By Mouth Once A Day 4)  Calcium Carbonate-Vitamin D 600-400 Mg-Unit  Tabs (Calcium Carbonate-Vitamin D) .... 2 Tabs By Mouth Once Daily 5)  Vitamin D3 2000 Unit Caps (Cholecalciferol) .... Take 1 Capsule By Mouth Once A Day 6)  Hydrochlorothiazide 25 Mg Tabs (Hydrochlorothiazide) .... Take 1 Tablet By Mouth Once A Day 7)  Spironolactone 25 Mg Tabs (Spironolactone) .... 2 Tabs By Mouth Once Daily 8)  Bayer Aspirin Ec Low Dose 81 Mg  Tbec (Aspirin) .... Take 1 Tablet By Mouth Once A Day 9)  Fish Oil 1000 Mg Caps (Omega-3 Fatty Acids) .... Take 1 Capsule By Mouth Once A Day 10)  Claritin 10 Mg Tabs (Loratadine) .... Take 1 Tablet By Mouth Once A Day 11)  Aleve 220 Mg Tabs (Naproxen Sodium) .... Per Bottle 12)  Tramadol Hcl 50 Mg Tabs (Tramadol Hcl) .[name].. 1 By Mouth Every 4 Hr As  Needed Pain 13)  Delsym 30 Mg/56ml  Lqcr (Dextromethorphan Polistirex) .... 2 Tsp Two Times A Day As Needed 14)  Maalox Plus 225-200-25 Mg/26ml Susp (Alum & Mag Hydroxide-Simeth) .... Per Bottle  Allergies (verified): 1)  ! Iron  Past History:  Past Medical History: Colon polyps     - colonoscopy 04/08/2005 CHRONIC RHINITIS (ICD-472.0)     - tried immunotherapy 9/00 MORBID OBESITY (ICD-278.01)    - Target wt  =  163 for BMI < 30  DEGENERATIVE JOINT DISEASE (ICD-715.90).....................Marland KitchenMarland KitchenAlusio/ [place] [facility] HYPERTENSION (ICD-401.9)  HEALTH  MAINTENANCE....................................................[place]      - Pneumovax 11/04      - CPX October 30, 2008  GYN Care.................................................................................[facility]       - Bone density/mammography per gyn  Vital Signs:  Patient profile:   75 year old female Weight:      168 pounds O2 Sat:      98 % on Room air Temp:     97.6 degrees F oral Pulse rate:   80 / minute BP sitting:   130 / 70  (left arm)  Vitals Entered By: Tilden Dome (November 18, 2009 10:46 AM)  O2 Flow:  Room air  Physical Exam  Additional Exam:  wt  165 October 30, 2008 >> 165 January 01, 2009 >165 September 30, 2009 > 168 November 18, 2009  Ambulatory healthy appearing in no acute distress,  HEENT: nl dentition, turbinates, and orophanx. Nl external ear canals without cough reflex Neck without JVD/Nodes/TM Lungs clear to A and P bilaterally without cough on insp or exp maneuvers RRR no s3 or murmur or increase in P2 Abd soft and benign with nl excursion in the supine position. No bruits or organomegaly Ext warm without calf tenderness, cyanosis clubbing  Skin warm and dry without lesions   MS  pulses sym and intact, equal strength,   nml gait. , arthritic changes in hands.    Impression & Recommendations:  Problem # 1:  BACK PAIN (ICD-724.5)  General guidlelines reviewed. as long as no radicular features or increase need for as needed tramadol ok to rx symptomatically with ortho f/u as needed.  Orders: Est. Patient Level III [id number])  Problem # 2:  EDEMA (ICD-782.3)  ok on rx  Orders: Est. Patient Level III [id number])  Problem # 3:  MORBID OBESITY (ICD-278.01)  Weight control is a matter of calorie balance which needs to be tilted in the pt's favor by eating less and exercising more.  Specifically, I recommended  exercise at a level where pt  is short of breath but not out of breath 30 minutes daily.  If not losing weight on this  program, I would strongly recommend pt see a nutritionist with a food diary recorded for two weeks prior to the visit.     Patient Instructions: 1)  See calendar for specific medication instructions and bring it back for each and every office visit for every healthcare provider you see.  Without it,  you may not receive the best quality medical care that we feel you deserve.  2)  Return to office in 3 months for CPX

## 2010-11-25 NOTE — Assessment & Plan Note (Signed)
Summary: Primary svc/ cpx   PCP:  Darion Juhasz  Chief Complaint:  cpx fasting.  History of Present Illness: 70 yobf never smoker  with morbid obesity complicated by hypertension and tendency to fluid retention that has improved on a combination of spironolactone and hydrochlorothiazide daily.  She has had intermittent leg swelling with negative venous Dopplers in the past.  Labs 11/08 showed low vitamin d level at 20, started on vitamin D 50k qwk, vit d level normalized  06/26/08 ov no new co's, leg swelling better. occ HB without dysphagia.  October 30, 2008 cpx co increaese in left foot  swelling when standing alot otherwise no co's. Podiatrist want to operate on it. no pain,   walking ok.   No tia, claudiation symptoms or cp/sob     Prior Medications Reviewed Using: has calendar but forgot and left it at home  Updated Prior Medication List: SUPER MULTIPLE   CAPS (MULTIPLE VITAMINS-MINERALS) Take 1 tablet by mouth once a day GLUCOSAMINE-CHONDROITIN 500-400 MG  TABS (GLUCOSAMINE-CHONDROITIN) Take 1 tablet by mouth once a day CALCIUM-MAGNESIUM 300-300 MG  TABS (CALCIUM-MAGNESIUM) 2 tabs by mouth once daily HYDROCHLOROTHIAZIDE 25 MG TABS (HYDROCHLOROTHIAZIDE) Take 1 tablet by mouth once a day and 1 extra as needed SPIRONOLACTONE 25 MG TABS (SPIRONOLACTONE) 2 tabs by mouth once daily BAYER ASPIRIN EC LOW DOSE 81 MG  TBEC (ASPIRIN) Take 1 tablet by mouth once a day CLARITIN 10 MG  TABS (LORATADINE) Take 1 tablet by mouth once a day as needed ALEVE 220 MG  TABS (NAPROXEN SODIUM) per bottle * QUININE 260mg  1 at bedtime as needed DELSYM 30 MG/5ML  LQCR (DEXTROMETHORPHAN POLISTIREX) 2 tsp two times a day as needed ROBAXIN 500 MG  TABS (METHOCARBAMOL) 1 tab every 8 hours as needed  Current Allergies (reviewed today): No known allergies   Past Medical History:    Colon polyps        - colonoscopy 04/08/2005    CHRONIC RHINITIS (ICD-472.0)        - tried immunotherapy 9/00    MORBID OBESITY  (ICD-278.01)       - Target wt  =  163 for BMI < 30     DEGENERATIVE JOINT DISEASE (ICD-715.90)    HYPERTENSION (ICD-401.9)     [place] 11/08         - Pneumovax 11/04         - CPX October 30, 2008     GYN Care.................................................................................[facility]          - Bone density/mammography per gyn  Past Surgical History:    Cataract durgery (rt eye) 2009    Hysterectomy 1980    Right TKR 2002    Left TKR 2003   Family History:     Positive for heart disease in remote members of her family    only.  No breast or colon cancer in her family to her knowledge.  Her    brother was a smoker had lung cancer.   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     [employer] parttime    Never smoker    No ETOH    Review of Systems  The patient denies anorexia, fever, weight loss, weight gain, vision loss, decreased hearing, hoarseness, chest pain, syncope, dyspnea on exertion, prolonged cough, headaches, hemoptysis, abdominal pain, melena, hematochezia, severe  indigestion/heartburn, hematuria, incontinence, muscle weakness, suspicious skin lesions, transient blindness, difficulty walking, depression, unusual weight change, abnormal bleeding, enlarged lymph nodes, angioedema, and breast masses.     Vital Signs:  Patient Profile:   75 Years Old Female Height:     62.5 inches Weight:      165 pounds O2 Sat:      95 % O2 treatment:    Room Air Temp:     98.0 degrees F oral Pulse rate:   94 / minute BP sitting:   138 / 76  (left arm)  Vitals Entered By: Tilden Dome (October 30, 2008 8:49 AM)                 Physical Exam  wt 167 > 165 October 30, 2008  Ambulatory healthy appearing in no acute distress. Afeb with normal vital signs HEENT: nl dentition, turbinates, and orophanx. Nl external ear canals without cough reflex Neck without  JVD/Nodes/TM Lungs clear to A and P bilaterally without cough on insp or exp maneuvers RRR no s3 or murmur or increase in P2 Abd soft and benign with nl excursion in the supine position. No bruits or organomegaly Ext warm without calf tenderness, cyanosis clubbing or edema Skin warm and dry without lesions   MS  severe flat deformities bilaterally, pulses sym and intact   EKG  Procedure date:  10/29/2008  Findings:      nsr , no def ischemic or hypertrophic changes  CXR  Procedure date:  10/29/2008  Findings:      no cm or effusions   Pulmonary Function Test Height (in.): 62.5 Gender: Female    Impression & Recommendations:  Problem # 1:  MORBID OBESITY (ICD-278.01)  Weight control is a matter of calorie balance which needs to be tilted in the pt's favor by eating less and exercising more.  Specifically, I recommended  exercise at a level where pt  is short of breath but not out of breath 30 minutes daily.  If not losing weight on this program, I would strongly recommend pt see a nutritionist with a food diary recorded for two weeks prior to the visit.     Problem # 2:  VITAMIN D DEFICIENCY (ICD-268.9)  Orders: T-Vitamin D (25-Hydroxy) [id number])   Problem # 3:  HYPERTENSION (ICD-401.9)  Her updated medication list for this problem includes:    Hydrochlorothiazide 25 Mg Tabs (Hydrochlorothiazide) .[name]... Take 1 tablet by mouth once a day and 1 extra as needed    Spironolactone 25 Mg Tabs (Spironolactone) .[name]... 2 tabs by mouth once daily  Orders: EKG w/ Interpretation (93000) T-2 View CXR, Same Day ([id number]) TLB-Lipid Panel (80061-LIPID) TLB-BMP (Basic Metabolic Panel-BMET) (99991111) TLB-Hepatic/Liver Function Pnl (80076-HEPATIC) TLB-CBC Platelet - w/Differential (85025-CBCD) TLB-TSH (Thyroid Stimulating Hormone) (84443-TSH) TLB-Udip ONLY (81003-UDIP)   Problem # 4:  EDEMA (ICD-782.3) Creat 1.2 > 1.4 so no more room for additional diretics  Other  Orders: Est. Patient 65& > [id number])   Patient Instructions: 1)  See calendar for specific medication instructions and bring it back for each and every office visit for every healthcare provider you see.  Without it,  you may not receive the best quality medical care that we feel you deserve.  2)  check phone tree by end of week for results 3)  Please schedule a follow-up appointment in 3 months.   ]

## 2010-11-27 NOTE — Medication Information (Signed)
Summary: Hydrochlorothiazide & Spironolactone / Right Source  Hydrochlorothiazide & Spironolactone / Right Source   Imported By: Rise Patience 11/04/2010 16:00:08  _____________________________________________________________________  External Attachment:    Type:   Image     Comment:   External Document

## 2010-11-27 NOTE — Assessment & Plan Note (Signed)
Summary: NP follow up - weakness/fatigue   Primary Provider/Referring Provider:  [name]  CC:  decreased energy, fatigue, and states she feels "droopy"..  History of Present Illness: 75  yobf never smoker  with morbid obesity complicated by hypertension and tendency to fluid retention controlled on spironolactone and hydrochlorothiazide daily.  Hx of intermittent leg swelling with negative venous Dopplers in the past.  Labs 11/08 showed low vitamin d level at 20, started on vitamin D 50k qwk, vit d level normalized  06/26/08 ov no new co's, leg swelling better. occ HB without dysphagia.  October 30, 2008 cpx co increaese in left foot  swelling when standing alot otherwise no co's. Podiatrist want to operate on it. no pain,   walking ok.   No tia, claudiation symptoms or cp/sob  January 01, 2009-r work in visit c/o  bilat hip pain toward the buttock area, also c/o increased edema in lower left leg and a "crawling" sensation in the right groin area.  d as chronic venous insuff. prev ven dopp neg.    01/15/09 ov no better, pain both hips but worse on right rad to post right without numbness, no back pain or change in bowel or bladder habits. no fever. rec ortho eval rec muphy/wainer and rec short cycle of prednisone.  Prednsione did help transiently Ended up seeing Dr Evelina Bucy and Maureen Ralphs separately,  rec aleve/ tylenol not effective - no f/u pain   November  4, Followup for tramadol refill.  Pt states that she needs this med for arthritis pain in back.     September 30, 2009 --Presents for follow up and med review. Pt brought all her meds today, we updated her med calendar.   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 When it came time for her to say her part she could not remember her lines. She states after a minute or so she remembered and said her lines. Pt is concerned about this and watned to let MW know what happened.    November 18, 2009 6 wk  followup.  Pt states that she is doing well and denies any complaints today. Needing tramadol and all her prns less using med calendar consistently.  PAGE 2 >>>>>>>>>>>>>>>February 12, 2010  cpx no new c/os.   --labs and bmd reviewed. Aleve changed to Sulindac.   May 01, 2010-Presents for work in visit. Complains that ears have felt "stopped up" x3weeks. More allergy symptoms this summer. Nose feels stopped up/stuffy.  Has had a few episodes of lightheadedness. Ears are stopped up, used sweet oil in ears but did not help. Has been doing alot of yard work. Last visit aleve changed to sulindac. Will need bmet -she has renal insufficiency w/ last scr at 1.5. Sulindac does not help.  Denies chest pain, dyspnea, orthopnea, hemoptysis, fever, n/v/d, edema, headache, visual/speech changes.   July 01, 2010--Returns for follow up and memory evaluation and labs. Last visit renal insufficiency w/ sCr 1.5>1.6 , sulindac changed to as needed. Will check bmet today.  Voices concerns about incident that happened 2 weeks ago. Says she was involved in a play at church but had episode that lasted for  ~1 minute she forgot all her lines. She does not believe she was nervous. But lost her train of thought, she felt weird. She heard everyone talking, she laughed and found her paper w/ lines on it and all of  sudden remembered her lines. Says she was very tired.  Had been working very hard 2 weeks prior to play, she was feeling a large baking order and was very tired. She does have a  Family hx of dementia---father had dementa--passed at 72 ? onset . We completed MMSE and clock draw which were normal today. Labs done earlier this year were essentially unremarkable except for renal fxn as mentioned before. Denies chest pain, dyspnea, orthopnea, hemoptysis, fever, n/v/d, edema, headache, visual/speech changes. during her episode w/ no visual changes, dysphagia, extermity weakness, speech changes.  October 24, 2010 --Presents  for a work in visit. Complains of decreased energy, fatigue, states she feels "droopy". Just does not feel good, does not have much energy, feels sleepy for last 3 weeks. Denies chest pain, dyspnea, orthopnea, hemoptysis, fever, n/v/d, edema, headache,recent travel or antibiotics, rash, urinary symptoms, abdominal pain, polyuria/polydipsia. Can not put finger on what is wrong with her. Has been sleeping good. Has been having to take naps. No new meds. No unusal stress other than holidays. denies extremity weakness, visual/speech changes. 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   Medications Prior to Update: 1)  Centrum Silver  Tabs (Multiple Vitamins-Minerals) .... Take 1 Tablet By Mouth Once A Day 2)  Glucosamine-Chondroitin 500-400 Mg  Tabs (Glucosamine-Chondroitin) .... Take 2 Tablets By Mouth Once A Day 3)  Calcium Carbonate-Vitamin D 600-400 Mg-Unit  Tabs (Calcium Carbonate-Vitamin D) .... 2 Tabs By Mouth Once Daily 4)  Vitamin D3 2000 Unit Caps (Cholecalciferol) .... Take 1 Capsule By Mouth Once A Day 5)  Hydrochlorothiazide 25 Mg Tabs (Hydrochlorothiazide) .... Take 1 Tablet By Mouth Once A Day 6)  Spironolactone 25 Mg Tabs (Spironolactone) .... 2 Tabs By Mouth Once Daily 7)  Bayer Aspirin Ec Low Dose 81 Mg  Tbec (Aspirin) .... Take 1 Tablet By Mouth Once A Day 8)  Fish Oil 1000 Mg Caps (Omega-3 Fatty Acids) .... Take 1 Capsule By Mouth Once A Day 9)  Metamucil Smooth Texture 63 % Powd (Psyllium) .[name].. 1 Tablespoon At Bedtime As Needed  Current Medications (verified): 1)  Centrum Silver  Tabs (Multiple Vitamins-Minerals) .... Take 1 Tablet By Mouth Once A Day 2)  Glucosamine-Chondroitin 500-400 Mg  Tabs (Glucosamine-Chondroitin) .... Take 2 Tablets By Mouth Once A Day 3)  Calcium Carbonate-Vitamin D 600-400 Mg-Unit  Tabs (Calcium Carbonate-Vitamin D) .... 2 Tabs By Mouth Once Daily 4)  Vitamin D3 2000 Unit Caps (Cholecalciferol) .... Take 1 Capsule By Mouth Once A Day 5)   Hydrochlorothiazide 25 Mg Tabs (Hydrochlorothiazide) .... Take 1 Tablet By Mouth Once A Day 6)  Spironolactone 25 Mg Tabs (Spironolactone) .... 2 Tabs By Mouth Once Daily 7)  Bayer Aspirin Ec Low Dose 81 Mg  Tbec (Aspirin) .... Take 1 Tablet By Mouth Once A Day 8)  Fish Oil 1000 Mg Caps (Omega-3 Fatty Acids) .... Take 1 Capsule By Mouth Once A Day 9)  Metamucil Smooth Texture 63 % Powd (Psyllium) .[name].. 1 Tablespoon At Bedtime As Needed 10)  Saline Nasal Spray 0.65 % Soln (Saline) .... 2 Sprays Each Nostril Two Times A Day As Needed 11)  Allegra Allergy 180 Mg Tabs (Fexofenadine Hcl) .... Take 1 Tablet By Mouth Once A Day As Needed  Allergies (verified): 1)  ! Iron  Past History:  Past Medical History: Last updated: 07/01/2010 Colon polyps     - colonoscopy 04/08/2005 CHRONIC RHINITIS (ICD-472.0)     - tried immunotherapy 9/00 MORBID OBESITY (ICD-278.01)    -  Target wt  =  163 for BMI < 30  DEGENERATIVE JOINT DISEASE (ICD-715.90).....................Marland KitchenMarland KitchenAlusio/ [facility] HYPERTENSION (ICD-401.9) CHRONIC RENAL INSUFFICIENCY     - Creat 1.5 February 12, 2010 , >1.3 July 02, 2010 (off NSAIDS)  HEALTH MAINTENANCE.........................................................Marland KitchenWert     - dt 11/08      - Pneumovax 11/04 age 23      - CPX February 12, 2010  GYN Care.................................................................................[facility]        - Bone density/mammography per gyn        -BMD 2011-nml  MEMORY LOSS        -MMSE July 01, 2010 >>30/30         -Clock Draw July 01, 2010 >>nml         -RPR -neg, B12/Folate nml  Past Surgical History: Last updated: 10/30/2008 Cataract durgery (rt eye) 2009 Hysterectomy 1980 Right TKR 2002 Left TKR 2003  Family History: Last updated: 10/30/2008  Positive for heart disease in remote members of her family only.  No breast or colon cancer in her family to her knowledge.  Her brother was a smoker had lung cancer.    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  [employer] parttime Never smoker No ETOH  Risk Factors: Smoking Status: never (02/12/2010)  Review of Systems      See HPI  Vital Signs:  Patient profile:   75 year old female Height:      62.25 inches Weight:      167.31 pounds BMI:     30.47 O2 Sat:      99 % on Room air Temp:     98.2 degrees F oral Pulse rate:   77 / minute BP sitting:   140 / 72  (left arm) Cuff size:   regular  Vitals Entered By: Parke Poisson CNA/MA (October 24, 2010 9:33 AM)  O2 Flow:  Room air CC: decreased energy, fatigue, states she feels "droopy". Is Patient Diabetic? No Comments Medications reviewed with patient Daytime contact number verified with patient. Parke Poisson CNA/MA  October 24, 2010 9:34 AM    Physical Exam  Additional Exam:  wt  165 October 30, 2008 >> 165 January 01, 2009 >165 September 30, 2009 > 168 November 18, 2009 > 165 February 12, 2010 >>168 May 01, 2010 >>165 July 01, 2010>>167 October 24, 2010  Ambulatory healthy appearing in no acute distress,  HEENT: edentulous , pale nasal mucosa.  TM nml, EAC clear.  Neck without JVD/Nodes/TM Lungs clear to A and P bilaterally without cough on insp or exp maneuvers RRR no s3 or murmur or increase in P2 Abd soft and benign with nl excursion in the supine position. No bruits or organomegaly Ext warm without calf tenderness, cyanosis clubbing  Skin warm and dry without lesions   MS  pulses sym and intact, equal strength,   nml gait. , arthritic changes in hands.  Neuro Intact w/ no focal deficits noted. equal strength bilaterally. nml gait, CN2-12 intact.       Impression & Recommendations:  Problem # 1:  WEAKNESS (ICD-780.79)  ?etiology to her complaints of fatigue. Will check labs along w/ Urine to r/o infection, anemia, DM, etc.  Plan: We are checking labs today , I will call  next week with lab results.  Try to set boundaries and do not overwork yourself.  Rest and stress reducers.  Please contact office for sooner follow up if symptoms do not improve or worsen  follow up as scheduled with Dr. [name]   Orders: TLB-BMP (Basic Metabolic Panel-BMET) ([id number]) TLB-CBC Platelet - w/Differential (85025-CBCD) TLB-B12 + Folate Pnl [id number]) TLB-IBC Pnl (Iron/FE;Transferrin) (83550-IBC) Est. Patient Level IV [id number])  Medications Added to Medication List This Visit: 1)  Saline Nasal Spray 0.65 % Soln (Saline) .... 2 sprays each nostril two times a day as needed 2)  Allegra Allergy 180 Mg Tabs (Fexofenadine hcl) .... Take 1 tablet by mouth once a day as needed  Other Orders: T-Urine Microscopic [id number]) T-Culture, Urine [id number])  Patient Instructions: 1)  We are checking labs today , I will call next week with lab results.  2)  Try to set boundaries and do not overwork yourself.  3)  Rest and stress reducers.  4)  Please contact office for sooner follow up if symptoms do not improve or worsen  5)  follow up as scheduled with Dr. [name]     Appended Document: Orders Update    Clinical Lists Changes  Orders: Added new Test order of TLB-Udip w/ Micro (81001-URINE) - Signed

## 2010-12-29 ENCOUNTER — Ambulatory Visit: Payer: Self-pay | Admitting: Adult Health

## 2011-01-01 ENCOUNTER — Encounter: Payer: Self-pay | Admitting: Adult Health

## 2011-01-01 ENCOUNTER — Ambulatory Visit (INDEPENDENT_AMBULATORY_CARE_PROVIDER_SITE_OTHER): Payer: Medicare PPO | Admitting: Adult Health

## 2011-01-01 DIAGNOSIS — R413 Other amnesia: Secondary | ICD-10-CM

## 2011-01-01 DIAGNOSIS — K219 Gastro-esophageal reflux disease without esophagitis: Secondary | ICD-10-CM

## 2011-01-06 NOTE — Assessment & Plan Note (Signed)
Summary: memory test//kp   Primary Provider/Referring Provider:  [name]  CC:  Memory test-c/o excessive gas x 2 mths., worse for 4 days now, and no cramping.  History of Present Illness: 52   yobf never smoker  with morbid obesity complicated by hypertension and tendency to fluid retention controlled on spironolactone and hydrochlorothiazide daily.  Hx of intermittent leg swelling with negative venous Dopplers in the past.  Labs 11/08 showed low vitamin d level at 20, started on vitamin D 50k qwk, vit d level normalized  06/26/08 ov no new co's, leg swelling better. occ HB without dysphagia.  October 30, 2008 cpx co increaese in left foot  swelling when standing alot otherwise no co's. Podiatrist want to operate on it. no pain,   walking ok.   No tia, claudiation symptoms or cp/sob  January 01, 2009-r work in visit c/o  bilat hip pain toward the buttock area, also c/o increased edema in lower left leg and a "crawling" sensation in the right groin area.  d as chronic venous insuff. prev ven dopp neg.    01/15/09 ov no better, pain both hips but worse on right rad to post right without numbness, no back pain or change in bowel or bladder habits. no fever. rec ortho eval rec muphy/wainer and rec short cycle of prednisone.  Prednsione did help transiently Ended up seeing Dr Evelina Bucy and Maureen Ralphs separately,  rec aleve/ tylenol not effective - no f/u pain   November  4, Followup for tramadol refill.  Pt states that she needs this med for arthritis pain in back.     September 30, 2009 --Presents for follow up and med review. Pt brought all her meds today, we updated her med calendar.   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 When it came time for her to say her part she could not remember her lines. She states after a minute or so she remembered and said her lines. Pt is concerned about this and watned to let MW know what happened.    November 18, 2009 6 wk  followup.  Pt states that she is doing well and denies any complaints today. Needing tramadol and all her prns less using med calendar consistently.  PAGE 2 >>>>>>>>>>>>>>>February 12, 2010  cpx no new c/os.   --labs and bmd reviewed. Aleve changed to Sulindac.   May 01, 2010-Presents for work in visit. Complains that ears have felt "stopped up" x3weeks. More allergy symptoms this summer. Nose feels stopped up/stuffy.  Has had a few episodes of lightheadedness. Ears are stopped up, used sweet oil in ears but did not help. Has been doing alot of yard work. Last visit aleve changed to sulindac. Will need bmet -she has renal insufficiency w/ last scr at 1.5. Sulindac does not help.  Denies chest pain, dyspnea, orthopnea, hemoptysis, fever, n/v/d, edema, headache, visual/speech changes.   July 01, 2010--Returns for follow up and memory evaluation and labs. Last visit renal insufficiency w/ sCr 1.5>1.6 , sulindac changed to as needed. Will check bmet today.  Voices concerns about incident that happened 2 weeks ago. Says she was involved in a play at church but had episode that lasted for  ~1 minute she forgot all her lines. She does not believe she was nervous. But lost her train of thought, she felt weird. She heard everyone talking, she laughed and found her paper w/ lines  on it and all of sudden remembered her lines. Says she was very tired.  Had been working very hard 2 weeks prior to play, she was feeling a large baking order and was very tired. She does have a  Family hx of dementia---father had dementa--passed at 87 ? onset . We completed MMSE and clock draw which were normal today. Labs done earlier this year were essentially unremarkable except for renal fxn as mentioned before. Denies chest pain, dyspnea, orthopnea, hemoptysis, fever, n/v/d, edema, headache, visual/speech changes. during her episode w/ no visual changes, dysphagia, extermity weakness, speech changes.  October 24, 2010 --Presents  for a work in visit. Complains of decreased energy, fatigue, states she feels "droopy". Just does not feel good, does not have much energy, feels sleepy for last 3 weeks. Denies chest pain, dyspnea, orthopnea, hemoptysis, fever, n/v/d, edema, headache,recent travel or antibiotics, rash, urinary symptoms, abdominal pain, polyuria/polydipsia. Can not put finger on what is wrong with her. Has been sleeping good. Has been having to take naps. No new meds. No unusal stress other than holidays. denies extremity weakness, visual/speech changes. 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   January 01, 2011 --Presents for follow up for memory evaluation Has noticed over last few years memory is not as good at times. Previous workup 06/2010 showed MMSE 30/30 w/ nml clock draw, neg RPR and nml B12/folate. Father had dementia- passed at 71.  Complains of  excessive gas x 2 mths.,worse for 4 days now, no cramping, no bloody stools or urinary symptoms. Mild intermittent constipation.  She is feeling better with more energy. Tolerating meds. Today MMSE was good at 29/30, good recall and nml clock draw. Denies chest pain, dyspnea, orthopnea, hemoptysis, fever, n/v/d, edema, headache,recent travel or antibiotics.     Preventive Screening-Counseling & Management  Alcohol-Tobacco     Smoking Status: never  Current Medications (verified): 1)  Centrum Silver  Tabs (Multiple Vitamins-Minerals) .... Take 1 Tablet By Mouth Once A Day 2)  Glucosamine-Chondroitin 500-400 Mg  Tabs (Glucosamine-Chondroitin) .... Take 2 Tablets By Mouth Once A Day 3)  Calcium Carbonate-Vitamin D 600-400 Mg-Unit  Tabs (Calcium Carbonate-Vitamin D) .... 2 Tabs By Mouth Once Daily 4)  Vitamin D3 2000 Unit Caps (Cholecalciferol) .... Take 1 Capsule By Mouth Once A Day 5)  Hydrochlorothiazide 25 Mg Tabs (Hydrochlorothiazide) .... Take 1 Tablet By Mouth Once A Day 6)  Spironolactone 25 Mg Tabs (Spironolactone) .... 2 Tabs By Mouth Once Daily 7)   Bayer Aspirin Ec Low Dose 81 Mg  Tbec (Aspirin) .... Take 1 Tablet By Mouth Once A Day 8)  Fish Oil 1000 Mg Caps (Omega-3 Fatty Acids) .... Take 1 Capsule By Mouth Once A Day 9)  Metamucil Smooth Texture 63 % Powd (Psyllium) .[name].. 1 Tablespoon At Bedtime As Needed 10)  Saline Nasal Spray 0.65 % Soln (Saline) .... 2 Sprays Each Nostril Two Times A Day As Needed 11)  Allegra Allergy 180 Mg Tabs (Fexofenadine Hcl) .... Take 1 Tablet By Mouth Once A Day As Needed 12)  Vitamin C 1000 Mg Tabs (Ascorbic Acid) .... Take 1 Tablet By Mouth Once Daily 13)  Iron 325 (65 Fe) Mg Tabs (Ferrous Sulfate) .... Take 1 Tablet By Mouth Once Daily  Allergies: 1)  ! Iron  Past History:  Past Surgical History: Last updated: 10/30/2008 Cataract durgery (rt eye) 2009 Hysterectomy 1980 Right TKR 2002 Left TKR 2003  Family History: Last updated: 10/30/2008  Positive for heart disease in remote  members of her family only.  No breast or colon cancer in her family to her knowledge.  Her brother was a smoker had lung cancer.  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  [employer] parttime Never smoker No ETOH  Risk Factors: Alcohol Use: 0 (02/12/2010)  Risk Factors: Smoking Status: never (01/01/2011)  Past Medical History: Colon polyps/Diverticulosis      - colonoscopy 04/08/2005, 07/2010-repeat in 5 years  CHRONIC RHINITIS (ICD-472.0)     - tried immunotherapy 9/00 MORBID OBESITY (ICD-278.01)    - Target wt  =  163 for BMI < 30  DEGENERATIVE JOINT DISEASE (ICD-715.90).....................Marland KitchenMarland KitchenAlusio/ [facility] HYPERTENSION (ICD-401.9) CHRONIC RENAL INSUFFICIENCY     - Creat 1.5 February 12, 2010 , >1.3 July 02, 2010 (off NSAIDS)  HEALTH MAINTENANCE.........................................................Marland KitchenWert     - dt 11/08      - Pneumovax 11/04 age 35      - CPX February 12, 2010  GYN  Care.................................................................................[facility]        - Bone density/mammography per gyn        -BMD 2011-nml  MEMORY LOSS        -MMSE July 01, 2010 >>30/30         -Clock Draw July 01, 2010 >>nml >>January 01, 2011 29-30/30 MMSE, nml clock draw        -RPR -neg, B12/Folate nml  Review of Systems      See HPI  Vital Signs:  Patient profile:   75 year old female Height:      62.25 inches Weight:      168.13 pounds O2 Sat:      97 % on Room air Temp:     97.0 degrees F oral Pulse rate:   74 / minute BP sitting:   132 / 72  (left arm) Cuff size:   regular  Vitals Entered By: Donita Brooks RN (January 01, 2011 9:38 AM)  O2 Flow:  Room air CC: Memory test-c/o excessive gas x 2 mths.,worse for 4 days now, no cramping Is Patient Diabetic? No Comments Medications reviewed with patient ,phone # verfied. Donita Brooks RN  January 01, 2011 9:38 AM    Physical Exam  Additional Exam:  wt  165 October 30, 2008 >> 165 January 01, 2009 >165 September 30, 2009 > 168 November 18, 2009 > 165 February 12, 2010 >>168 May 01, 2010 >>165 July 01, 2010>>167 October 24, 2010 >>168 January 01, 2011  Ambulatory healthy appearing in no acute distress,  HEENT: edentulous , pale nasal mucosa.  TM nml, EAC clear.  Neck without JVD/Nodes/TM Lungs clear to A and P bilaterally without cough on insp or exp maneuvers RRR no s3 or murmur or increase in P2 Abd soft and benign with nl excursion in the supine position. No bruits or organomegaly Ext warm without calf tenderness, cyanosis clubbing  Skin warm and dry without lesions   MS  pulses sym and intact, equal strength,   nml gait. , arthritic changes in hands.  Neuro Intact w/ no focal deficits noted. equal strength bilaterally. nml gait  MMSE 29/30, nml clock draw      Impression & Recommendations:  Problem # 1:  MEMORY LOSS (ICD-780.93)    She tested essentially nml on MMSE and clock draw.   will  rpeat MMSE in 1 year if needed.   Orders:  Est. Patient Level IV [id number])  Problem # 2:  GASTROESOPHAGEAL REFLUX DISEASE (ICD-530.81)  Mild IBS w/ gas and bloating,  advised on gas x and stool softner, high fiber diet.  up to date on colonoscopy.   Orders: Est. Patient Level IV [id number])  Medications Added to Medication List This Visit: 1)  Vitamin C 1000 Mg Tabs (Ascorbic acid) .... Take 1 tablet by mouth once daily 2)  Iron 325 (65 Fe) Mg Tabs (Ferrous sulfate) .... Take 1 tablet by mouth once daily  Patient Instructions: 1)   Gas x with meals as needed  2)  High fiber diet  3)  stool softner as needed for constipation  4)  Rest and stress reducers.  5)  Please contact office for sooner follow up if symptoms do not improve or worsen  6)  follow up with Dr. [name] 4 months

## 2011-01-13 NOTE — Letter (Signed)
Summary: [place] [facility]   Imported By: Phillis Knack 01/07/2011 15:33:42  _____________________________________________________________________  External Attachment:    Type:   Image     Comment:   External Document

## 2011-03-10 NOTE — Assessment & Plan Note (Signed)
[place]                             PULMONARY OFFICE NOTE   NAME:Felicia, [name]                 MRN:          [id number]  DATE:09/02/2007                            DOB:          26-Apr-1936    HISTORY:  A 75 year old black female with morbid obesity complicated by  hypertension and tendency to fluid retention that has improved on a  combination of spironolactone and hydrochlorothiazide daily.  She has  had intermittent leg swelling with negative venous Dopplers in the past  that has also been well controlled on this combination.   She comes in today stating that she is mainly limited by knee pain,  status post right knee replacement surgery in 2002 and left knee  replacement in 2003.  She has not found Aleve to be particularly  effective, and is limited in terms of her activity more on the basis of  her knees than any other complaint.  She denies any variability with  weather changes, and denies any ongoing leg swelling.   PAST MEDICAL HISTORY:  1. Morbid obesity with target weight of 151.  2. Hypertension.  3. Chronic rhinitis with seasonal variation controlled with Claritin.  4. Rectal bleeding felt to be hemorrhoidal, status post colonoscopy      most recently July of 2006 consistent with diverticulosis and      benign polyps.  5. Status post hysterectomy in 1980.  6. Status post bilateral knee replacement as noted above.   ALLERGIES:  None known.   MEDICATIONS:  Taken in detail, using the medication calendar format.  For details, see column dated September 02, 2007 on medication face sheet.   SOCIAL HISTORY:  She has never smoked.  She is working part time at [employer]  and [employer].   FAMILY HISTORY:  Positive for heart disease in remote members of her  family only.  She has 1 brother with MI, 1 sister with stroke.  No  breast or colon cancer in her family to her knowledge.  She is the  oldest of multiple siblings.  One of her sisters  also has hypertension.   REVIEW OF SYSTEMS:  Taken in detail on the worksheet, negative except as  outlined above.   PHYSICAL EXAMINATION:  This is a stoic, ambulatory black female in no  acute distress.  She is afebrile with normal vital signs with a weight of 161 which is no  change in baseline.  The weight is down 9 pounds from a year ago.  Height is unchanged at 5 feet 2-1/2.  HEENT:  Nonspecific moderate turbinate edema with slight cyanotic  change.  She had a full set of dentures in place.  Ear canals were clear  bilaterally.  NECK:  Supple without cervical adenopathy or tenderness.  Trachea is  midline, no thyromegaly.  Carotid upstrokes were brisk without any  bruits.  CHEST:  Completely clear bilaterally to auscultation and percussion with  excellent air movement.  There was no cough on inspiratory or expiratory  maneuvers.  HEART:  Regular rate and rhythm without murmur, gallop, or rub with  displacement of PMI.  ABDOMEN:  Obese but otherwise benign with no palpable organomegaly or  masses or tenderness.  Femoral pulses were present bilaterally with no  bruits.  Her pedal pulses were somewhat asymmetric with the right dorsalis pedis  slightly stronger than the left.   LABORATORY DATA:  Urinalysis was unremarkable.  TSH was normal.  LDL  cholesterol was only 101.  HDL cholesterol was 51.  CBC revealed a  hematocrit of 34.7% with an MCV of 94 and BMET was normal.   Chest x-ray showed mild cardiomegaly, no infiltrates or effusions.   EKG showed nonspecific ST-T wave changes that were more pronounced in  the lateral leads.   IMPRESSION:  1. Hypertension is adequately controlled as is tendency to fluid      retention on her present diuretic regimen which also affords      calcium sparing.  2. Degenerative arthritis is the main problem that is limiting her at      present.  I have referred her back to her orthopedist for followup      (Dr. Wynelle Link), but have nothing else  to add from a medical      perspective.  3. Unfortunately, due to her knees, she is not able to exercise as      much as she should to get her weight down to 151 pounds.  On the      other hand, she is trending in the right direction, and I have      reinforced the concept of calorie balance with her today.  4. Chronic rhinitis with postnasal drip syndrome, controlled on      Claritin.  5. Diverticulosis with colon polyps, in the computer for recall.  6. General health maintenance.  She was updated on tetanus and given      influenza today.  Followup will be every 3 months, sooner if      needed.     [name]. [name], MD, [facility]  Electronically Signed    MBW/MedQ  DD: 09/06/2007  DT: 09/07/2007  Job #: [id number]

## 2011-03-10 NOTE — Assessment & Plan Note (Signed)
[place]                             PULMONARY OFFICE NOTE   NAME:[name], [name]                 MRN:          [id number]  DATE:05/16/2007                            DOB:          [birth year]    HISTORY:  This is a 75 year old, black female with hypertension and  tendency to peripheral edema who has noticed increasing swelling over  the last several weeks but no orthopnea or dyspnea. The swelling is  symmetric, worse as the day goes on. She denies excessive use of salt.   For a full inventory of medications please see face sheet, dated May 16, 2007.   PHYSICAL EXAMINATION:  GENERAL:  She is a pleasant, ambulatory, obese,  black female in no acute distress.  VITAL SIGNS:  She had stable vital signs. Blood pressure 110/62.  HEENT:  Unremarkable. Pharynx clear.  LUNGS:  Lung fields are clear bilaterally to auscultation and  percussion.  HEART:  She has a regular rate and rhythm without murmur, gallop or rub.  ABDOMEN:  Soft and benign.  EXTREMITIES:  Warm without calf tenderness, cyanosis, or clubbing. There  was 1+ pitting edema in a symmetric fashion bilaterally which was  pitting.   IMPRESSION:  Chronic leg swelling secondary to obesity/mild venous  insufficiency with no evidence of asymmetry or congestive heart failure  clinically. Since the problem is chronic, I am going to recommend simply  having her take a second dose of hydrochlorothiazide 25 mg extra one  daily p.r.n. and added this to the medication calendar in a user  friendly unambiguous fashion under the p.r.n. list of medicines.   FOLLOWUP:  She is due for a comprehensive examination in 3 months and  will see her sooner if needed.     Christena Deem. [name], MD, [facility]  Electronically Signed    MBW/MedQ  DD: 05/16/2007  DT: 05/17/2007  Job #: [id number]

## 2011-03-13 NOTE — Op Note (Signed)
[place]. [facility]  Patient:    [name], [name]                   MRN: [id number] Proc. Date: 11/19/00 Adm. Date:  [id number] Attending:  Meriel Flavors                           Operative Report  PREOPERATIVE DIAGNOSIS:  End-stage degenerative arthritis, right knee, with varus alignment.  POSTOPERATIVE DIAGNOSIS:  End-stage degenerative arthritis, right knee, with varus alignment.  PROCEDURE:  Right total knee replacement, Osteon[employer] prosthesis.  Appropriate soft tissue balancing.  Press-Fit #7 cruciate-retaining femoral component. Cemented #7 tibial component with 12 mm polyethylene insert.  A 26 mm cemented nonmetal-backed patellar component.  SURGEON:  Ninetta Lights, M.D.  ASSISTANT:  Aaron Edelman D. Petrarca, P.A.-C.  ANESTHESIA:  General.  ESTIMATED BLOOD LOSS:  Minimal.  TOURNIQUET TIME:  1 hour 10 minutes.  SPECIMENS EXCISED:  Bone and soft tissue.  CULTURES:  None.  COMPLICATIONS:  None.  DRESSING:  Soft compressive with knee immobilizer.  DRAINS:  Hemovac x 2.  DESCRIPTION OF PROCEDURE:  Patient brought to the operating room and placed on the operating table in supine position.  After adequate anesthesia had been obtained, tourniquet applied upper aspect of the right leg.  Prepped and draped in the usual sterile fashion.  Examined with varus alignment correctable to valgus with pseudolaxity, MCL.  Passive motion 0-100 degrees. No flexion contracture.  Ligaments otherwise stable.  Leg was elevated, Esmarch applied, tourniquet inflated to 350 mmHg.  Straight incision above the patella down to the tibial tubercle.  All of this done after appropriate prepping and draping.  Skin and subcutaneous tissue divided.  Medial parapatellar arthrotomy.  Joint exposed.  Grade 4 changes throughout. Periarticular spurring removed.  Remnants of menisci and anterior cruciate ligament removed. Hypertrophic synovitis excised.  Distal femur  exposed. Intramedullary guide placed.  Distal cut removing 10 mm, set at 5 degrees of valgus.  Sized for a #7 component.  Jigs put in place, definitive cuts made. Trial put in place, found to fit well.  Trial removed.  Recess examined. Spurs, loose body, remnants of menisci all removed.  Tibia exposed.  Tibial spine removed with a saw.  Extramedullary guide placed, removing 4 mm with a 5 degree posterior slope cut.  Sized to a #7 component.  Trials put in place. With the 12 mm insert and after appropriate posterior and partial PCL release and balancing, I had full extension, full flexion, good alignment, and no component lift-off with flexion.  Marked for rotation and then the tibia was hand-reamed.  All trials removed.  The patella was sized, reamed, and drilled for a 26 mm component.  All trials reinserted.  Good patellofemoral tracking, good alignment, good stability, with full extension and full flexion and nicely balanced knee set at 5 degrees of valgus.  Popliteal tendon and PCL both retained.  All trials removed.  The knee was copiously irrigated with a pulse irrigating device.  Cement prepared, placed on the tibial component, which was hammered in place.  Excessive cement then removed.  Polyethylene attached.  Femoral component seated.  Knee reduced.  Patellar component was well-seated, compressed after cement removed.  Once the cement had hardened, the knee was re-examined.  Full extension, full flexion, with good stability, good alignment, and no component lift-off.  Hemovac was placed, brought out through separate stab wounds.  Arthrotomy closed with #1  Vicryl, skin and subcutaneous tissue with Vicryl and staples.  Margins of the wound and the knee injected with Marcaine.  Sterile compressive dressing applied. Tourniquet deflated and removed.  Prior to closure, bone pegs were placed up into the distal canal in the femur to prevent postoperative bleeding.  At completion, the  anesthesia reversed, brought to the recovery room.  Tolerated the surgery well.  No complications. DD:  11/19/00 TD:  11/20/00 Job: [id number]

## 2011-03-13 NOTE — Assessment & Plan Note (Signed)
[place]                             PULMONARY OFFICE NOTE   NAME:Alspaugh, KORALINE BARES                 MRN:          [id number]  DATE:10/13/2006                            DOB:          1936-06-24    HISTORY OF PRESENT ILLNESS:  The patient is a 75 year old African-  American female patient of Dr. Gustavus Bryant who has a known history of  hypertension, rhinitis and osteoarthritis and presents for a routine  followup. The patient had been having a flare of arthritic complaints  last visit. The patient had been recommended to use Aleve and also had  been having some post nasal drip symptoms and had been recommended to  use Claritin. Since last visit, the patient reports she is much improved  with decreased arthralgias and nasal drip has improved as well. The  patient has brought her medications in today for review which are  correct with our medication list. The patient denies any chest pain,  shortness of breath, orthopnea, PND or leg swelling.   PAST MEDICAL HISTORY:  Reviewed.   CURRENT MEDICATIONS:  Reviewed.   PHYSICAL EXAMINATION:  GENERAL:  The patient is a pleasant female in no  acute distress  VITAL SIGNS:  She is afebrile with stable vital signs.  HEENT:  Nasal mucosa was slightly pale, nontender sinuses. The posterior  pharynx is clear.  NECK:  Supple without adenopathy.  LUNGS:  Lung sounds are clear.  CARDIAC:  Regular rate.  ABDOMEN:  Soft and benign.  EXTREMITIES:  Warm without any edema.   IMPRESSION/PLAN:  1. Degenerative arthritis optimally controlled on p.r.n. Aleve. The      patient is to return back with Dr. [name] as scheduled or sooner if      needed.  2. Chronic rhinitis with improvement on p.r.n. Claritin. The patient      to return back with Dr. [name] as scheduled in 2-3 months or sooner      if needed.  3. Complex medication regimen. The patient's medications reviewed in      detail. Patient      education was provided. A  computerized medication calendar was      adjusted accordingly and reviewed with patient.      Rexene Edison, NP  Electronically Signed      Christena Deem. [name], MD, [facility]  Electronically Signed   TP/MedQ  DD: 10/15/2006  DT: 10/16/2006  Job #: [id number]

## 2011-03-13 NOTE — Discharge Summary (Signed)
[place]. [facility]  Patient:    [name], [name]                   MRN: [id number] Adm. Date:  [id number] Disc. Date: [id number] Attending:  Meriel Flavors Dictator:   West Carbo, P.A..                           Discharge Summary  ADMISSION DIAGNOSIS:  End-stage degenerative joint disease right knee.  DISCHARGE DIAGNOSIS:  End-stage degenerative joint disease right knee.  PROCEDURE:  Right total knee arthroplasty.  HISTORY OF PRESENT ILLNESS:  This is a 75 year old widowed female with a long-standing history of bilateral degenerative joint disease of the knees, right greater than left.  Patient now having pain with ADLs and night pain. The patient has failed conservative therapy and patient is to be moved for right total knee replacement.  HOSPITAL COURSE:  After the patient received 1 g of cefazolin IV in the OR holding area, she underwent a right total knee arthroplasty after which she received for anticoagulation, heparin 5000 units subcutaneous every 12 hours until her Coumadin became therapeutic.  She was placed on a Coumadin protocol per hospital pharmacy.  She tolerated this procedure well after which her Foley was placed to gravity, which would be discharged on postoperative day #2.  Her CPM was placed for 0-30 degrees for eight hours a day, which should be increased by 10 degrees per day.  Her Hemovac was clamped for four hours, a knee immobilizer placed on her right knee and she is to use this when she is walking.  A PT/OT consult was requested and she was placed on a total knee protocol with appropriate precautions.  She was placed on weightbearing as tolerated status.  Went to the PACU, she did receive AP and lateral radiographs of her right knee and continued to receive 1 g of cefazolin IV every eight hours for a total of three doses.  Once transferred to orthopedic floor from the PACU, she followed a normal course.  By  postoperative day #2, on 11/20/00, she was found to be hypokalemic and hyponatremic.  Her IV was changed to normal saline with 20 mEq of potassium chloride.  Her Foley was discontinued on 1/26 as well as was her PCA pump.  She was then to start Percocet p.o. 1-2 every 4-6 p.r.n. pain.  An internal medicine consult was requested from which an order was written for the patient to receive 40 mEq of potassium chloride that morning.  On 1/28, the patient was continually encouraged to use her incentive spirometry at least once an hour while she is awake.  Chest x-ray was also ordered on 1/28 as well as a clean catch or straight cath urine for routine C&S.  On 1/29, discharge planning for a 11/24/00 discharge date.  On 11/24/00, the patient was discharged to home, she had a nonadverse hospital course.  EKG dated 11/16/00 showed normal sinus rhythm with first degree AV block.  There was an old inferior infarct. Radiology, a 2-view right knee dated 11/19/00 shows a well seated prosthesis status post right total knee replacement.  Radiology report dated 11/22/00, a 2-veiw chest shows borderline heart size, which could be related to technique, no acute pulmonary disease.  LABORATORY DATA:  Preoperative labs date 11/16/00 show a white blood cell count of 5.8, hemoglobin 12.1, hematocrit 35.5, platelets 309.  A PT of 12.5, an INR of 1.0  and a PTT of 25.  Routine chemistry dated 11/16/00, sodium 136, potassium 3.4, chloride 100, glucose 76, BUN 14, creatinine 0.7, an albumin of 3.4, AST of 19.  On 11/16/00, the ALT was 17.  Discharge labs dated 11/22/00 show a white blood cell count of 10.6, hemoglobin 9.0, hematocrit 27.2, platelets 194, coagulation studies dated 11/24/00, PT 17.5 and an INR of 1.7. . Discharge chemistries dated 11/22/00 show sodium 136, potassium 3.3, chloride 97, glucose 116, BUN of 9, creatinine of 0.6, a magnesium of 1.5.  A urinalysis was obtained on 11/16/00 which showed a pH of 7.5, trace  leukocyte esterase.  Many epithelial and few bacteria.  A followup urinalysis on 11/22/00 showed pH of 7.5, glucose of 250, urobilinogen of 1.0, negative nitrite, a clean catch urine culture and sensitivity from 11/22/00 showed insignificant growth and patients blood type is ______  positive, antibody screen negative.  DISCHARGE MEDICATIONS: 1. Percocet 1-2 every four hours as needed for pain. 2. Coumadin 5 mg as directed by pharmacy. 3. Iron sulfate 325 mg, take one daily with meal. 4. Potassium 40 mEq in the morning and 20 mEq in the p.m.  ACTIVITY:  The patient is to be up as instructed by physical therapy.  DIET:  There are no restrictions on the patients diet.  WOUND CARE:  Patient is to keep her dressing wound clean and dry.  She will check her temperature four times a day.  SPECIAL INSTRUCTIONS:  The patient is aware that she should notify her doctor of increased pain, redness, swelling, drainage, or temperature greater than 101.0.  FOLLOWUP:  The patient will return to the office in 10 days.  She will call us for an appointment for staple removal and dressing change. DD:  12/22/00 TD:  12/23/00 Job: [id number]

## 2011-03-13 NOTE — Op Note (Signed)
TNAMENETTA, BRETZ                   ACCOUNT NO.:  [id number]   MEDICAL RECORD NO.:  [id number]                   PATIENT TYPE:  INP   LOCATION:  NA                                   FACILITY:  [facility]   PHYSICIAN:  Dione Plover. Aluisio, M.D.              DATE OF BIRTH:  1936/05/25   DATE OF PROCEDURE:  05/24/2002  DATE OF DISCHARGE:                                 OPERATIVE REPORT   PREOPERATIVE DIAGNOSES:  Osteoarthritis of the left knee with valgus  deformity.   POSTOPERATIVE DIAGNOSES:  Osteoarthritis of the left knee with valgus  deformity.   OPERATION PERFORMED:  Left total knee arthroplasty.   SURGEON:  Dione Plover. Aluisio, M.D.   ASSISTANT:  Alexzandrew L. Dara Lords, P.A.   ANESTHESIA:  General.   ESTIMATED BLOOD LOSS:  Minimal.   DRAINS:  Hemovac times one.   TOURNIQUET TIME:  53 minutes at 300 mmHg.   COMPLICATIONS:  None.   CONDITION:  Stable to recovery.   INDICATIONS FOR PROCEDURE:  Ms. Ferdig is a 75 year old female with severe  osteoarthritis of the left knee with significant valgus deformity greater  than 10 degrees.  She has had pain refractory to nonoperative management.  She presents now for left total knee arthroplasty.  She has had a previous  successful right total knee arthroplasty.   DESCRIPTION OF PROCEDURE:  After successful administration of general  anesthetic, a tourniquet was placed on the left thigh.  The left lower  extremity was prepped and draped in the usual sterile fashion.  The  extremity was wrapped with an Esmarch, knee flexed, tourniquet inflated to  300 mmHg.  A standard midline incision was made with a 10 blade through the  subcutaneous tissues to the level of the extensor mechanism.  Given her  significant valgus deformity, we did a lateral parapatellar approach.  The  soft tissue over the proximal lateral tibia was then subperiosteally  elevated to the joint line.  The patella was then everted medially, knee  flexed to 90  degrees.  ACL and PCL removed.  She had severe bone-on-bone  change with bony erosions laterally.  There was a lateral proximal tibial  defect.  There was significant cortical groove in the tibia.  Medially, she  also had significant cartilage loss as well as in the patellofemoral  compartment.  The ACL and PCL were subsequently removed and a drill used to  create a starting hole in the distal femur.  The canal was irrigated and a 5  degree left valgus alignment guide placed.  Referencing off the posterior  condyle, marked and blocked and removed 9 mm off the distal femur.  Distal  femoral resection was made with an oscillating saw.   A sizing block was placed and a size 3 was most appropriate.  She had a lot  of posterolateral loss such that we had to do a rotation off the epicondylar  axis.  The  size 3 block was subsequently pinned and the anterior and  posterior cuts made.   The tibia was subluxed forward and menisci removed.  Extramedullary tibial  guide was placed referencing proximally at the medial aspect of the tibial  tubercle and distally along the second metatarsal axis of tibial crest.  Block was pinned to remove 8 mm off the medial side which had a slight  cartilaginous deficiency but no full thickness cartilage loss.  Tibial  resection was made with an oscillating saw.  A size 3 tibial trial was  placed, fixed well and thus the proximal tibial was prepared with a modular  drill and keel punch.   The intercondylar block was placed on the distal femur and those cuts  subsequently made.  A size 3 posterior stabilized femoral trial, size 3 load-  bearing tibial tray trial were placed with a 10 mm rotating platform,  posterior stabilized insert.  With the 10 mm there was a slight bit of varus  valgus play and thus we went to a 12.5 and there was fantastic stability and  balance through full extension and all the way down past 120 degrees of  flexion.  The patella thickness was  then measured to be 21 mm.  Freehand  resection taken down to 12 mm and the 35 template placed.  A lug hole was  drilled.  Trial patella placed and tracked normally.   The trials were then removed, except for the femoral trial and osteophytes  subsequently removed off  the posterior femur.  Femoral trial was then  removed and then the cut bone surface prepared with pulsatile lavage.  Cement mixed and once ready for implantation, a size 3 load-bearing tibial  tray, size 3 posterior stabilized femur and the 35 patellar were cemented  into place.  The patella was held with the clamp.  The 12.5 mm trial insert  was place.  Knee held in full extension and all extruded cement removed.  Once the cement was hardened and a permanent 12.5 rotating platform,  posterior stabilized insert was placed into a tibial tray.  The wound was  copiously irrigated with antibiotic solution and the tourniquet released  with a total time of 53 minutes.  Minor bleeding stopped with cautery.  The  fat pad was closed and then the lateral arthrotomy closed with the exception  of leaving it open from the superior to inferior aspect of the patella, thus  a lateral release.  The knee was flexed against gravity down to 135 degrees  and the patella tracked perfectly.  The subcutaneous tissues were then  closed with interrupted 2-0 Vicryl and subcuticular running 4-0 Monocryl.  The incision was clean and dry.  A sterile bulky dressing applied and drain  hooked to suction.  She was placed into a knee immobilizer, awakened and  transported to recovery in stable condition.                                               Dione Plover Aluisio, M.D.    FVA/MEDQ  D:  05/24/2002  T:  05/27/2002  Job:  [id number]

## 2011-03-13 NOTE — Assessment & Plan Note (Signed)
[place]                               PULMONARY OFFICE NOTE   NAME:[name], [name]                 MRN:          [id number]  DATE:09/01/2006                            DOB:          [birth year]    This is a primary service/comprehensive health care evaluation.   HISTORY:  A 75 year old black female, never a smoker, in for a comprehensive  health care evaluation for hypertension and arthritis, status post bilateral  knee replacement surgery remotely, complaining of aches and pains in her  hips, knees, ankles, for which she has used Aleve but does not get much  help.  She feels a bit of stiffness in the morning, but no typical gel  phenomenon, and no real focal joint complaints or history of joint swelling  or redness or injury.   PAST MEDICAL HISTORY:  1. Morbid obesity with target weight 151.  2. Hypertension.  3. Chronic rhinitis, controlled on p.r.n. Claritin.  4. Rectal bleeding, felt to be hemorrhoidal, status post colonoscopy June      of 2006 consistent with diverticulosis with benign polyps.  5. Hysterectomy in 1980.  6. Bilateral knee replacement, the most recent of which was July of 2003      on the left side.   ALLERGIES:  None known.   MEDICATIONS:  Taken in detail on the work sheet column dated September 01, 2006.  Correct as listed, matching up to the calendar 100% that she carries  with her.   SOCIAL HISTORY:  She has never smoked.  She is working part-time now at [employer].   FAMILY HISTORY:  Positive for heart disease in remote members of her family  only.  No breast or colon cancer in her family to her knowledge.  Her  brother was a smoker had lung cancer.   REVIEW OF SYSTEMS:  Taken in detail on the work sheet, and significant for  the problems as outlined above.  Significant for excessive postnasal  drainage.  Note she is not using Claritin.   PHYSICAL EXAMINATION:  This is a somber but not overtly  depressed,  ambulatory, black female in no acute distress, with a height of 5 feet 2-1/2  inches, which is no change from baseline, and a blood pressure of 120/74.  HEENT:  Reveals moderate, nonspecific turbinate edema.  Oropharynx is clear,  dentition intact. Ear canals are clear bilaterally.  NECK:  Supple without cervical adenopathy or tenderness.  The trachea was  midline, no thyromegaly.  Carotid upstrokes are brisk without any bruits.  Lung fields are perfectly clear bilaterally to auscultation and percussion.  There is a regular rhythm without murmur, gallop or rub present.  ABDOMEN:  Obese, but otherwise benign without no palpable organomegaly, mass  or tenderness.  EXTREMITIES:  Warm without calf tenderness, cyanosis, clubbing or edema.  Pedal pulses were present bilaterally, stronger in the dorsalis pedis than  the posterior tibial distribution.  NEUROLOGIC:  No focal deficits or pathologic reflexes.  SKIN:  Warm and dry.  MUSCULOSKELETAL:  Exam revealed minimal crepitation to the knees, full range  of  motion at hips, knees and ankles, with no obvious joint effusions,  erythema or calor.   LABORATORY DATA:  Sed rate was 27.  Chemistry profile was normal, with a BUN  of 25, creatinine of 1.4, with a calculated GFR of 48.  TSH was normal.  Total cholesterol was 157, with an HDL of 47 and an LDL of 97.  EKG and  chest x-ray were normal.   IMPRESSION:  1. Degenerative arthritis with minimum elevation of sedimentation rate.  I      do not believe she is actually taking the Aleve in the maximum strength      recommended per bottle, but I have asked her to do so, and if this      fails, would consider having Dr. Ronnie Derby reevaluate any specific joint      complaints that she has, especially related to her knees, since she is      status post knee replacement.  I believe much of this is wear and tear      arthritis and not rheumatologic,and unlikely to benefit from more       aggressive medical therapy therefore.  2. Morbid obesity with slight improvement toward target of 151 pounds,      which is encouraging, especially since she is not able to exercise as      much as she likes because of arthritis constraints.  3. Postnasal drip syndrome.  I have recommended that she use the Claritin      that is listed as a p.r.n., and if she is not happy with that we need      to see her back to consider nasal steroids and/or empiric treatment      directed at reflux, since this is another common cause of postnasal      drip syndrome, noting that she does indeed have a history of ACE      inhibitor intolerance, which is seen in patients who have chronic      reflux.  4. Diverticulosis with colon polyps, in the computer for recall.  5. General health maintenance.  She will need update next year for      tetanus.  She was updated at age 76 for Pneumovax, and I did recommend      a flu vaccination now and every fall.     Christena Deem. [name], MD, [facility]  Electronically Signed    MBW/MedQ  DD: 09/07/2006  DT: 09/08/2006  Job #: [id number]

## 2011-03-13 NOTE — Assessment & Plan Note (Signed)
[facility]                             PULMONARY OFFICE NOTE   NAME:[name], [name]                 MRN:          [id number]  DATE:02/15/2007                            DOB:          1936-03-28    HISTORY:  A 75 year old black female in for follow-up evaluation of  hypertension complaining of a sore instep of her left foot.  She is  extremely flat-footed and has been trying to walk more but having  difficulty with pain on the instep.  She denies any history of gouty  attacks, although she is on thiazide diuretics, any history consistent  with gouty attacks, or history of gout, but notes she is on thiazide  diuretics.  She denies any edema, other myalgias, arthralgias, fevers,  chills, sweats, chest pain, or leg swelling.   For a full inventory of medications, please see face sheet, dated February 15, 2007, which correlates nicely with her present medication sheet.   PHYSICAL EXAMINATION:  GENERAL:  She is a pleasant, ambulatory black  female in no acute distress.  VITAL SIGNS:  She is afebrile with normal vital signs.  HEENT:  Unremarkable.  Oropharynx is clear.  NECK:  Supple without cervical adenopathy, tinnitus.  Trachea is  midline.  No thyromegaly.  LUNGS:  Completely clear bilaterally to auscultation and percussion.  HEART:  Regular rhythm without murmur, rub or gallop.  ABDOMEN:  Soft, benign.  EXTREMITIES:  Warm without calf tenderness, clubbing, cyanosis or edema.  On the instep of her left foot, she has a dime-sized plantar's wart.  She has palpable pulses bilaterally.   IMPRESSION:  1. This patient is extremely flat-footed with plantar's wart      developing on the instep of the left foot.  I believe she would      benefit from podiatry evaluation.  2. Hypertension, which is well controlled on the present regimen,      which includes thiazide diuretics with no evidence of gouty      arthritis.  I may recommend she stay on the  same regimen and      continue to work on weight loss through diet but may need to find a      different exercise that does not (for instance, bicycle ergometry)      that does not put further pressure on her flat feet.   Followup is every three months, sooner if needed.     Christena Deem. [name], MD, [facility]  Electronically Signed    MBW/MedQ  DD: 02/15/2007  DT: 02/15/2007  Job #: [id number]

## 2011-03-13 NOTE — Discharge Summary (Signed)
[name], [name]                    ACCOUNT NO.:  [id number]   MEDICAL RECORD NO.:  [id number]                   PATIENT TYPE:  INP   LOCATION:  [place]                                 FACILITY:  [facility]   PHYSICIAN:  Dione Plover. Aluisio, M.D.              DATE OF BIRTH:  12/15/1935   DATE OF ADMISSION:  05/24/2002  DATE OF DISCHARGE:  05/28/2002                                 DISCHARGE SUMMARY   ADMITTING DIAGNOSES:  1. Osteoarthritis left knee.  2. Hypertension.   DISCHARGE DIAGNOSES:  1. Osteoarthritis left knee with valgus deformity status post left total     knee replacement arthroplasty.  2. Mild postoperative hyponatremia, improved.  3. Postoperative blood loss anemia, did not require transfusion.  4. Hypertension.   PROCEDURE:  The patient was taken to the operating room on May 24, 2002 and  underwent a left total knee replacement arthroplasty.  Surgeon Dr. Pilar Plate  Aluisio.  Assistant Arlee Muslim, P.A.-C.  Surgery under general anesthesia.  Hemovac drain x1.  Tourniquet time 53 minutes at 300 mmHg.   BRIEF HISTORY:  The patient is a 75 year old female that has been seen and  evaluated by Dr. Wynelle Link.  She has had progressive left knee pain to the  point where it has interfered with her daily activities.  The patient has  been seen in the office and found to have osteoarthritis of the left knee.  It is felt she would benefit from undergoing total knee replacement  arthroplasty.  Risks and benefits discussed and the patient is admitted for  surgery.   LABORATORY DATA:  CBC on admission:  Hemoglobin 11.2, hematocrit 32.8, white  count 4.7, red cell count 3.36, differential within normal limits.  Postoperative H&H 9.6 and 28.6.  Hemoglobin dropped postoperatively,  continued down to a level of 8.3 and 24.6, back up to 8.6 and 26.1 prior to  discharge.  PT/PTT on admission were 12.9 and 26 respectively.  Serial pro  times followed per Coumadin protocol.  Last noted  PT/INR 20.5 and 2.0.  Chemistry panel on admission all within normal limits.  Sodium did drop  postoperatively down from 138 to 133.  Was back up to 134.  Glucose elevated  from 84 to 139, was back to down 115.  Urinalysis on admission negative.  Blood group type A+.  Portable chest on May 24, 2002:  No active disease.  I do not see an EKG report on this chart.   HOSPITAL COURSE:  The patient was admitted to [facility], taken to  the OR.  Underwent the above stated procedure without complications.  The  patient tolerated procedure well.  Later was transferred to recovery room  and then to the orthopedic floor with continued postoperative care.  The  patient was placed on PCA analgesia for pain following surgery.  Underwent  24 hours of postoperative antibiotics.  Home medications were restarted.  The patient was weightbearing as  tolerated.  Hemovac drain was pulled on  postoperative day one.  PCA and IVs were discontinued on postoperative day  two.  The patient did have some mild postoperative hyponatremia.  Fluids  were discontinued.  BMETs were followed.  Sodium did slowly come back up to  134.  She was noted to have a drop in her hemoglobin postoperatively and got  as low as 8.3.  However, the patient was doing well and was asymptomatic and  elected not to receive blood.  Hemoglobin went back up to 8.6 prior to  discharge.  Physical therapy and occupational therapy were consulted to  assist with gait training ambulation.  The patient proceeded very well.  Was  ambulating approximately 85 feet by postoperative day one and increased up  to over 200 feet by postoperative day two.  There was question whether she  would need blood on postoperative day three.  It was rechecked.  Hemoglobin  was monitored closely and hemoglobin was back up to 8.6.  By postoperative  day four she had been ambulating greater than 200 feet, been doing quite  well, was asymptomatic, and was discharged  home.   DISCHARGE PLAN:  The patient was discharged home on May 29, 2002.   DISCHARGE DIAGNOSES:  Please see above.   DISCHARGE MEDICATIONS:  1. Trinsicon for two additional weeks.  2. Percocet for pain.  3. Coumadin per pharmacy protocol.  4. Robaxin for spasm.   DIET:  Low sodium diet.   FOLLOW UP:  The patient is to follow up on Tuesday or Thursday, August 12  and 14 for follow-up.  Call the office for an appointment.   ACTIVITY:  Total knee protocol [facility].  Home health PT and home  health nursing for total knee protocol.  Weightbearing as tolerated.   CONDITION ON DISCHARGE:  Improved.     Alexzandrew L. Dara Lords, P.A.              Dione Plover Aluisio, M.D.    ALP/MEDQ  D:  06/14/2002  T:  06/14/2002  Job:  [id number]

## 2011-05-15 ENCOUNTER — Encounter: Payer: Self-pay | Admitting: Internal Medicine

## 2011-05-18 ENCOUNTER — Ambulatory Visit (INDEPENDENT_AMBULATORY_CARE_PROVIDER_SITE_OTHER): Payer: Medicare PPO | Admitting: Internal Medicine

## 2011-05-18 ENCOUNTER — Encounter: Payer: Self-pay | Admitting: Internal Medicine

## 2011-05-18 DIAGNOSIS — N189 Chronic kidney disease, unspecified: Secondary | ICD-10-CM

## 2011-05-18 DIAGNOSIS — N184 Chronic kidney disease, stage 4 (severe): Secondary | ICD-10-CM | POA: Insufficient documentation

## 2011-05-18 DIAGNOSIS — K219 Gastro-esophageal reflux disease without esophagitis: Secondary | ICD-10-CM

## 2011-05-18 DIAGNOSIS — N183 Chronic kidney disease, stage 3 unspecified: Secondary | ICD-10-CM | POA: Insufficient documentation

## 2011-05-18 DIAGNOSIS — I10 Essential (primary) hypertension: Secondary | ICD-10-CM

## 2011-05-18 NOTE — Assessment & Plan Note (Addendum)
Diet reviewed, rec against fish oil based on the latest reviews in Archives of Int Medicine

## 2011-05-18 NOTE — Assessment & Plan Note (Signed)
Adequate control on present rx, reviewed  

## 2011-05-18 NOTE — Progress Notes (Signed)
Subjective:     Patient ID: [name], female   DOB: Oct 20, 1936, 75 y.o.   MRN: [id number]  HPI 40 yobf never smoker with morbid obesity complicated by hypertension and tendency to fluid retention controlled on spironolactone and hydrochlorothiazide daily.     06/26/08 ov no new co's, leg swelling better. occ HB without dysphagia.   November 18, 2009 6 wk followup. Pt states that she is doing well and denies any complaints today. Needing tramadol and all her prns less using med calendar consistently.      05/18/2011 ov/Teresa Nicodemus cc bp f/u no cc   sob, swelling.  Pt denies any significant sore throat, dysphagia, itching, sneezing,  nasal congestion or excess/ purulent secretions,  fever, chills, sweats, unintended wt loss, pleuritic or exertional cp, hempoptysis, orthopnea pnd or .    Also denies any obvious fluctuation of symptoms with weather or environmental changes or other aggravating or alleviating factors.   :  Past Medical History:  Colon polyps  - colonoscopy 04/08/2005  CHRONIC RHINITIS (ICD-472.0)  - tried immunotherapy 9/00  MORBID OBESITY (ICD-278.01)  - Target wt = 163 for BMI < 30  DEGENERATIVE JOINT DISEASE (ICD-715.90).....................Marland KitchenMarland KitchenAlusio/ [facility]  HYPERTENSION (ICD-401.9)  CHRONIC RENAL INSUFFICIENCY  - Creat 1.5 February 12, 2010 , >1.3 July 02, 2010 (off NSAIDS)  HEALTH MAINTENANCE.........................................................Marland KitchenWert  - dt 11/08  - Pneumovax 11/04 age 27  - CPX February 12, 2010  GYN Care.................................................................................[facility]  - Bone density/mammography per gyn  -BMD 2011-nml  MEMORY LOSS  -MMSE July 01, 2010 >>30/30  -Clock Draw July 01, 2010 >>nml  -RPR -neg, B12/Folate nml   Past Surgical History:  Cataract durgery (rt eye) 2009  Hysterectomy 1980  Right TKR 2002  Left TKR 2003    Family History:  Positive for heart disease in remote members of her  family  only. No breast or colon cancer in her family to her knowledge. Her  brother was a smoker had lung cancer.   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  [employer] parttime  Never smoker  No ETOH      Review of Systems     Objective:   Physical Exam amb pleasant bf nad wt 165 October 30, 2008 >>  >>168 May 01, 2010 >>165 July 01, 2010>>167 October 24, 2010 > 167 05/18/2011  Ambulatory healthy appearing in no acute distress,  HEENT: edentulous , pale nasal mucosa. TM nml, EAC clear.  Neck without JVD/Nodes/TM  Lungs clear to A and P bilaterally without cough on insp or exp maneuvers  RRR no s3 or murmur or increase in P2  Abd soft and benign with nl excursion in the supine position. No bruits or organomegaly  Ext warm without calf tenderness, cyanosis clubbing  Skin warm and dry without lesions       Assessment:          Plan:

## 2011-05-18 NOTE — Patient Instructions (Addendum)
Please schedule a follow up visit in 3 months but call sooner if needed for CPX on return  GERD (REFLUX)  is an extremely common cause of respiratory symptoms, many times with no significant heartburn at all.    It can be treated with medication, but also with lifestyle changes including avoidance of late meals, excessive alcohol, smoking cessation, and avoid fatty foods, chocolate, peppermint, colas, red wine, and acidic juices such as orange juice.  NO MINT OR MENTHOL PRODUCTS SO NO COUGH DROPS  USE SUGARLESS CANDY INSTEAD (jolley ranchers or Stover's)  NO OIL BASED VITAMINS (two good portions of fish per week as good as does not cause any stomach or reflux problems)

## 2011-07-10 ENCOUNTER — Telehealth: Payer: Self-pay | Admitting: Internal Medicine

## 2011-07-10 DIAGNOSIS — Z1231 Encounter for screening mammogram for malignant neoplasm of breast: Secondary | ICD-10-CM

## 2011-07-10 NOTE — Telephone Encounter (Signed)
MW  Looks like you ordered pts last mammogram----ok to order this test through you this year?   Please advise. thanks

## 2011-07-11 NOTE — Telephone Encounter (Signed)
Ok to shedule it wherever she had it before

## 2011-07-11 NOTE — Telephone Encounter (Signed)
Not due until on or after 07/26/11

## 2011-07-13 NOTE — Telephone Encounter (Signed)
Order was sent to [facility] LMOVM for pt to be made aware.

## 2011-07-17 ENCOUNTER — Telehealth: Payer: Self-pay | Admitting: Internal Medicine

## 2011-07-17 DIAGNOSIS — Z1231 Encounter for screening mammogram for malignant neoplasm of breast: Secondary | ICD-10-CM

## 2011-07-17 NOTE — Telephone Encounter (Signed)
lmomtcb  

## 2011-07-20 NOTE — Telephone Encounter (Signed)
lmomtcb to inform pt ok to have this done in [place].  Order was already placed for this on 07/13/11.  Spoke with Golden Circle, [facility], she has been trying to contact pt to get this scheduled.  Will forward message to Golden Circle so she can contact pt regarding this.

## 2011-07-20 NOTE — Telephone Encounter (Signed)
MAMMOGRAM SCHEDULED AT [facility] IN [place] 09/10/11@3 :30PM PT AWARE Tillie Rung

## 2011-07-20 NOTE — Telephone Encounter (Signed)
That is fine 

## 2011-07-20 NOTE — Telephone Encounter (Signed)
Spoke with pt. She states due for annual mammogram and wants to have [place] sched this for her in [place]. TP, is this okay? Pls advise in MW absence, thanks!

## 2011-08-07 ENCOUNTER — Ambulatory Visit (INDEPENDENT_AMBULATORY_CARE_PROVIDER_SITE_OTHER)
Admission: RE | Admit: 2011-08-07 | Discharge: 2011-08-07 | Disposition: A | Discharge status: Home / Self Care | Payer: Medicare PPO | Source: Ambulatory Visit | Attending: Internal Medicine | Admitting: Internal Medicine

## 2011-08-07 ENCOUNTER — Encounter: Payer: Self-pay | Admitting: Internal Medicine

## 2011-08-07 ENCOUNTER — Ambulatory Visit (INDEPENDENT_AMBULATORY_CARE_PROVIDER_SITE_OTHER): Payer: Medicare PPO | Admitting: Internal Medicine

## 2011-08-07 DIAGNOSIS — E559 Vitamin D deficiency, unspecified: Secondary | ICD-10-CM

## 2011-08-07 DIAGNOSIS — Z0000 Encounter for general adult medical examination without abnormal findings: Secondary | ICD-10-CM

## 2011-08-07 DIAGNOSIS — M1990 Unspecified osteoarthritis, unspecified site: Secondary | ICD-10-CM

## 2011-08-07 DIAGNOSIS — I10 Essential (primary) hypertension: Secondary | ICD-10-CM

## 2011-08-07 DIAGNOSIS — Z23 Encounter for immunization: Secondary | ICD-10-CM

## 2011-08-07 DIAGNOSIS — R609 Edema, unspecified: Secondary | ICD-10-CM

## 2011-08-07 DIAGNOSIS — N189 Chronic kidney disease, unspecified: Secondary | ICD-10-CM

## 2011-08-07 MED ORDER — SULINDAC 200 MG PO TABS: ORAL_TABLET | ORAL | Status: DC

## 2011-08-07 NOTE — Progress Notes (Signed)
Subjective:    Patient ID: [name], female    DOB: May 24, 1936, 75 y.o.   MRN: [id number]  HPI  22 yobf never smoker with morbid obesity complicated by hypertension and tendency to fluid retention controlled on spironolactone and hydrochlorothiazide daily.    November 18, 2009 ov cc doing well and denies any complaints . Needing tramadol and all her prns less using med calendar consistently.   05/18/2011 ov/[name] cc bp f/u no cc sob, swelling, multiple minor complaints but esp new x 48 R hip pain, minimally better with tylenol, thinks she "slept on it funny" no radiation to knee, no pain with cough - denies having med calendar or using it.    08/07/2011 f/u ov/[name] CPX forgot to fast  :  Past Medical History:  Colon polyps  - colonoscopy 04/08/2005  CHRONIC RHINITIS (ICD-472.0)  - tried immunotherapy 06/1999 MORBID OBESITY (ICD-278.01)  - Target wt = 163 for BMI < 30  DEGENERATIVE JOINT DISEASE (ICD-715.90).....................Marland KitchenMarland KitchenAlusio/ [facility]  HYPERTENSION (ICD-401.9)  CHRONIC RENAL INSUFFICIENCY  - Creat 1.5 February 12, 2010 , >1.3 July 02, 2010 (off NSAIDS)  HEALTH MAINTENANCE.........................................................Marland KitchenWilhoit 11/08  - Pneumovax 11/04 age 47  - CPX 08/07/2011  - Med calendar lost 08/05/11 > referred back for new one>>> GYN Care.................................................................................[facility]  - Bone density/mammography per gyn  -BMD 2011-nml  MEMORY LOSS  -MMSE July 01, 2010 >>30/30  -Clock Draw July 01, 2010 >>nml  -RPR -neg, B12/Folate nml    Past Surgical History:  Cataract durgery (rt eye) 2009  Hysterectomy 1980  Right TKR 2002  Left TKR 2003   Family History:  Positive for heart disease in remote members of her family  only. No breast or colon cancer in her family to her knowledge. Her  brother was a smoker had lung cancer.   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  [employer] previously Never smoker  No ETOH    Review of Systems  Constitutional: Negative for fever, chills, diaphoresis, activity change, appetite change, fatigue and unexpected weight change.  HENT: Positive for sinus pressure. Negative for hearing loss, ear pain, nosebleeds, congestion, sore throat, facial swelling, rhinorrhea, sneezing, mouth sores, trouble swallowing, neck pain, neck stiffness, dental problem, voice change, postnasal drip, tinnitus and ear discharge.   Eyes: Negative for photophobia, discharge, itching and visual disturbance.  Respiratory: Positive for cough and shortness of breath. Negative for apnea, choking, chest tightness, wheezing and stridor.   Cardiovascular: Negative for chest pain, palpitations and leg swelling.  Gastrointestinal: Negative for nausea, vomiting, abdominal pain, constipation, blood in stool and abdominal distention.  Genitourinary: Positive for frequency. Negative for dysuria, urgency, hematuria, flank pain, decreased urine volume and difficulty urinating.  Musculoskeletal: Positive for myalgias, back pain and arthralgias. Negative for joint swelling and gait problem.  Skin: Negative for color change, pallor and rash.  Neurological: Negative for dizziness, tremors, seizures, syncope, speech difficulty, weakness, light-headedness, numbness and headaches.  Hematological: Negative for adenopathy. Does not bruise/bleed easily.  Psychiatric/Behavioral: Negative for confusion, sleep disturbance and agitation. The patient is not nervous/anxious.        Objective:   Physical Exam   wt 165 October 30, 2008 >>> 168 November 18, 2009 > 165 February 12, 2010 > 08/07/2011  169  Ambulatory healthy appearing in no acute distress,  HEENT: edentulous turbinates, and orophanx. Nl external ear canals without cough reflex  Neck without JVD/Nodes/TM  Lungs  clear to A and P bilaterally without cough  on insp or exp maneuvers  RRR no s3 or murmur or increase in P2  Abd soft and benign with nl excursion in the supine position. No bruits or organomegaly  Ext warm without calf tenderness, cyanosis clubbing  Skin warm and dry without lesions  MS pulses sym and intact, equal strength, nml gait. ,  Mild djd  changes in fingers Neuro:  Alert, approp, nl sensorium and short term recall. No motor, sensory or cerebellar def or path reflexes.      CXR  08/07/2011 :   Increased lung markings in the bases have progressed. This may represent a combination of atelectasis and scarring.       Assessment & Plan:

## 2011-08-07 NOTE — Patient Instructions (Addendum)
See Tammy NP w/in 2 weeks with all your medications, even over the counter meds, separated in two separate bags, the ones you take no matter what vs the ones you stop once you feel better and take only as needed when you feel you need them.   Tammy  will generate for you a new user friendly medication calendar that will put [place] all on the same page re: your medication use.     Without this process, it simply isn't possible to assure that we are providing  your outpatient care  with  the attention to detail we feel you deserve.   If we cannot assure that you're getting that kind of care,  then we cannot manage your problem effectively from this clinic.  Once you have seen Tammy and we are sure that we're all on the same page with your medication use she will arrange follow up with me.   PLEASE DO NOT EAT FOOD BEFORE YOUR NEXT VISIT - OK TO DRINK WATER WITH MEDICATIONS  Sulindac 200 mg one twice daily with meals as needed for arthritis.  LATE ADD Needs fasting lipids, bmet, cbc, tsh, u/a, lft's  And Vit D levels next ov

## 2011-08-08 ENCOUNTER — Encounter: Payer: Self-pay | Admitting: Internal Medicine

## 2011-08-08 NOTE — Assessment & Plan Note (Signed)
Ok to try sulindac since spares renal prostaglandins but will need to have renal function checked again in two weeks

## 2011-08-08 NOTE — Assessment & Plan Note (Signed)
Needs recheck next ov

## 2011-08-08 NOTE — Assessment & Plan Note (Signed)
Adequate control on present rx, reviewed  

## 2011-08-08 NOTE — Assessment & Plan Note (Signed)
Lab Results  Component Value Date   CREATININE 1.6* 10/24/2010   CREATININE 1.3* 07/01/2010   CREATININE 1.6* 05/01/2010    @ baseline but needs recheck in 2 weeks

## 2011-08-08 NOTE — Assessment & Plan Note (Signed)
Near goal and needs to maintain it

## 2011-08-10 ENCOUNTER — Telehealth: Payer: Self-pay | Admitting: Internal Medicine

## 2011-08-10 NOTE — Telephone Encounter (Signed)
Call pt: Reviewed cxr and no acute change so no change in recommendations made at ov (no concern re increased base markings, see ov)   I spoke with patient about results and he verbalized understanding and had no questions

## 2011-08-24 ENCOUNTER — Encounter: Payer: Self-pay | Admitting: Adult Health

## 2011-08-24 ENCOUNTER — Other Ambulatory Visit (INDEPENDENT_AMBULATORY_CARE_PROVIDER_SITE_OTHER): Payer: Medicare PPO

## 2011-08-24 ENCOUNTER — Other Ambulatory Visit: Payer: Self-pay | Admitting: Adult Health

## 2011-08-24 ENCOUNTER — Ambulatory Visit (INDEPENDENT_AMBULATORY_CARE_PROVIDER_SITE_OTHER): Payer: Medicare PPO | Admitting: Adult Health

## 2011-08-24 DIAGNOSIS — E785 Hyperlipidemia, unspecified: Secondary | ICD-10-CM

## 2011-08-24 DIAGNOSIS — N189 Chronic kidney disease, unspecified: Secondary | ICD-10-CM

## 2011-08-24 DIAGNOSIS — I10 Essential (primary) hypertension: Secondary | ICD-10-CM

## 2011-08-24 DIAGNOSIS — M1990 Unspecified osteoarthritis, unspecified site: Secondary | ICD-10-CM

## 2011-08-24 LAB — BASIC METABOLIC PANEL
BUN: 33 mg/dL — ABNORMAL HIGH (ref 6–23)
CO2: 28 mEq/L (ref 19–32)
Calcium: 9.6 mg/dL (ref 8.4–10.5)
Chloride: 103 mEq/L (ref 96–112)
Creatinine, Ser: 1.6 mg/dL — ABNORMAL HIGH (ref 0.4–1.2)
GFR: 41.44 mL/min — ABNORMAL LOW (ref >60.00)
Glucose, Bld: 106 mg/dL — ABNORMAL HIGH (ref 70–99)
Potassium: 4.3 mEq/L (ref 3.5–5.1)
Sodium: 138 mEq/L (ref 135–145)

## 2011-08-24 LAB — CBC WITH DIFFERENTIAL/PLATELET
Basophils Absolute: 0 10*3/uL (ref 0.0–0.1)
Basophils Relative: 0.6 % (ref 0.0–3.0)
Eosinophils Absolute: 0.2 10*3/uL (ref 0.0–0.7)
Eosinophils Relative: 2.8 % (ref 0.0–5.0)
HCT: 34.7 % — ABNORMAL LOW (ref 36.0–46.0)
Hemoglobin: 11.7 g/dL — ABNORMAL LOW (ref 12.0–15.0)
Lymphocytes Relative: 27.7 % (ref 12.0–46.0)
Lymphs Abs: 1.6 10*3/uL (ref 0.7–4.0)
MCHC: 33.8 g/dL (ref 30.0–36.0)
MCV: 99.4 fl (ref 78.0–100.0)
Monocytes Absolute: 0.5 10*3/uL (ref 0.1–1.0)
Monocytes Relative: 8.2 % (ref 3.0–12.0)
Neutro Abs: 3.5 10*3/uL (ref 1.4–7.7)
Neutrophils Relative %: 60.7 % (ref 43.0–77.0)
Platelets: 244 10*3/uL (ref 150.0–400.0)
RBC: 3.49 Mil/uL — ABNORMAL LOW (ref 3.87–5.11)
RDW: 13 % (ref 11.5–14.6)
WBC: 5.7 10*3/uL (ref 4.5–10.5)

## 2011-08-24 LAB — URINALYSIS, ROUTINE W REFLEX MICROSCOPIC
Bilirubin Urine: NEGATIVE
Ketones, ur: NEGATIVE
Nitrite: NEGATIVE
Specific Gravity, Urine: 1.01 (ref 1.000–1.030)
Total Protein, Urine: NEGATIVE
Urine Glucose: NEGATIVE
Urobilinogen, UA: 0.2 (ref 0.0–1.0)
pH: 6 (ref 5.0–8.0)

## 2011-08-24 LAB — LIPID PANEL
Cholesterol: 162 mg/dL (ref 0–200)
HDL: 51.7 mg/dL (ref >39.00)
LDL Cholesterol: 93 mg/dL (ref 0–99)
Total CHOL/HDL Ratio: 3
Triglycerides: 88 mg/dL (ref 0.0–149.0)
VLDL: 17.6 mg/dL (ref 0.0–40.0)

## 2011-08-24 LAB — HEPATIC FUNCTION PANEL
ALT: 20 U/L (ref 0–35)
AST: 25 U/L (ref 0–37)
Albumin: 4.1 g/dL (ref 3.5–5.2)
Alkaline Phosphatase: 58 U/L (ref 39–117)
Bilirubin, Direct: 0.1 mg/dL (ref 0.0–0.3)
Total Bilirubin: 0.3 mg/dL (ref 0.3–1.2)
Total Protein: 7.4 g/dL (ref 6.0–8.3)

## 2011-08-24 LAB — TSH: TSH: 2.13 u[IU]/mL (ref 0.35–5.50)

## 2011-08-24 NOTE — Progress Notes (Signed)
Addended by: Doroteo Glassman D on: 08/24/2011 01:42 PM   Modules accepted: Orders

## 2011-08-24 NOTE — Assessment & Plan Note (Signed)
Controlled on rx   

## 2011-08-24 NOTE — Patient Instructions (Addendum)
Continue on current regimen.  Follow med calendar closely and bring to each visit.  follow up Dr. [name]  In 3 months and As needed

## 2011-08-24 NOTE — Assessment & Plan Note (Signed)
Labs today  Patient's medications were reviewed today and patient education was given. Computerized medication calendar was adjusted/completed

## 2011-08-24 NOTE — Progress Notes (Signed)
Subjective:    Patient ID: [name], female    DOB: 1936-06-09, 75 y.o.   MRN: [id number]  HPI  16 yobf never smoker with morbid obesity complicated by hypertension and tendency to fluid retention controlled on spironolactone and hydrochlorothiazide daily.    November 18, 2009 ov cc doing well and denies any complaints . Needing tramadol and all her prns less using med calendar consistently.   05/18/2011 ov/Wert cc bp f/u no cc sob, swelling, multiple minor complaints but esp new x 48 R hip pain, minimally better with tylenol, thinks she "slept on it funny" no radiation to knee, no pain with cough - denies having med calendar or using it.   08/07/2011 f/u ov/Wert CPX forgot to fast  >>rx sulindac   08/24/2011 Follow up and Med review Pt returns for follow up and med review. We reviewed her medications and updated her medications w/ pt education. Her back and leg are feeling better. Did not use Sulindac very much .  She is fasting today and needs labs. Flu shot is UTD.     Past Medical History:  Colon polyps  - colonoscopy 04/08/2005  CHRONIC RHINITIS (ICD-472.0)  - tried immunotherapy 06/1999 MORBID OBESITY (ICD-278.01)  - Target wt = 163 for BMI < 30  DEGENERATIVE JOINT DISEASE (ICD-715.90).....................Marland KitchenMarland KitchenAlusio/ [facility]  HYPERTENSION (ICD-401.9)  CHRONIC RENAL INSUFFICIENCY  - Creat 1.5 February 12, 2010 , >1.3 July 02, 2010 (off NSAIDS)  HEALTH MAINTENANCE.........................................................Marland KitchenQuinlan 11/08  - Pneumovax 11/04 age 53  - CPX 08/07/2011  - Med calendar lost 08/05/11 > referred back for new one>>>08/24/2011  GYN Care.................................................................................[facility]  - Bone density/mammography per gyn  -BMD 2011-nml  MEMORY LOSS  -MMSE July 01, 2010 >>30/30  -Clock Draw July 01, 2010 >>nml  -RPR -neg, B12/Folate nml    Past Surgical History:  Cataract durgery (rt  eye) 2009  Hysterectomy 1980  Right TKR 2002  Left TKR 2003   Family History:  Positive for heart disease in remote members of her family  only. No breast or colon cancer in her family to her knowledge. Her  brother was a smoker had lung cancer.   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  News and Record previously Never smoker  No ETOH    Constitutional:   No  weight loss, night sweats,  Fevers, chills,  +fatigue, or  lassitude.  HEENT:   No headaches,  Difficulty swallowing,  Tooth/dental problems, or  Sore throat,                No sneezing, itching, ear ache, nasal congestion, post nasal drip,   CV:  No chest pain,  Orthopnea, PND, swelling in lower extremities, anasarca, dizziness, palpitations, syncope.   GI  No heartburn, indigestion, abdominal pain, nausea, vomiting, diarrhea, change in bowel habits, loss of appetite, bloody stools.   Resp: No shortness of breath with exertion or at rest.  No excess mucus, no productive cough,  No non-productive cough,  No coughing up of blood.  No change in color of mucus.  No wheezing.  No chest wall deformity  Skin: no rash or lesions.  GU: no dysuria, change in color of urine, no urgency or frequency.  No flank pain, no hematuria   MS:  No joint pain or swelling.  No decreased range of motion.    Psych:  No change in mood or affect. No depression or anxiety.  No memory loss.          Objective:   Physical Exam   wt 165 October 30, 2008 >>> 168 November 18, 2009 > 165 February 12, 2010 > 08/07/2011  169  >>169 08/24/2011  Ambulatory healthy appearing in no acute distress,  HEENT: edentulous turbinates, and orophanx. Nl external ear canals without cough reflex  Neck without JVD/Nodes/TM  Lungs clear to A and P bilaterally without cough on insp or exp maneuvers  RRR no s3 or murmur or increase in P2  Abd soft and benign with nl excursion in the supine  position. No bruits or organomegaly  Ext warm without calf tenderness, cyanosis clubbing  Skin warm and dry without lesions  MS pulses sym and intact, equal strength, nml gait. ,  Mild djd  changes in fingers Neuro:  Alert, approp, nl sensorium           Assessment & Plan:

## 2011-08-24 NOTE — Assessment & Plan Note (Signed)
Improved on rx

## 2011-08-25 LAB — VITAMIN D 25 HYDROXY (VIT D DEFICIENCY, FRACTURES): Vit D, 25-Hydroxy: 59 ng/mL (ref 30–89)

## 2011-08-28 ENCOUNTER — Encounter: Payer: Self-pay | Admitting: *Deleted

## 2011-09-10 ENCOUNTER — Ambulatory Visit: Payer: Self-pay

## 2011-09-24 ENCOUNTER — Encounter: Payer: Self-pay | Admitting: Internal Medicine

## 2011-10-15 ENCOUNTER — Telehealth: Payer: Self-pay | Admitting: Internal Medicine

## 2011-10-16 NOTE — Telephone Encounter (Signed)
Magda Paganini, I see that this message was forwarded to you.  Have you seen this paperwork for [facility] that patient dropped off 12.20.12?  Thanks.

## 2011-10-16 NOTE — Telephone Encounter (Signed)
I have the form, it is a blank rx request form from [facility]. I have no idea what meds she needs refilled. LMOMTB.

## 2011-10-19 MED ORDER — SULINDAC 200 MG PO TABS: ORAL_TABLET | ORAL | Status: DC

## 2011-10-19 NOTE — Telephone Encounter (Signed)
Pt stated that the form was for the sulindac to be sent to [facility] since they sent her a letter and stated that they could fill this med for her as well.  rx has been sent in to mail order pharmacy per pts request.  Pt is aware.

## 2011-11-03 ENCOUNTER — Other Ambulatory Visit: Payer: Self-pay | Admitting: Internal Medicine

## 2011-11-03 MED ORDER — HYDROCHLOROTHIAZIDE 25 MG PO TABS: 50.0000 mg | ORAL_TABLET | Freq: Every day | ORAL | Status: DC

## 2011-11-04 ENCOUNTER — Telehealth: Payer: Self-pay | Admitting: Internal Medicine

## 2011-11-04 NOTE — Telephone Encounter (Signed)
Rx was sent to [facility] on 11/03/11 90 days supply x 1 refill--Pt is aware of this and needed nothing further.

## 2011-11-11 ENCOUNTER — Encounter: Payer: Self-pay | Admitting: *Deleted

## 2011-11-11 ENCOUNTER — Other Ambulatory Visit: Payer: Self-pay | Admitting: *Deleted

## 2011-11-11 MED ORDER — HYDROCHLOROTHIAZIDE 25 MG PO TABS: 25.0000 mg | ORAL_TABLET | Freq: Every day | ORAL | Status: DC

## 2011-11-18 ENCOUNTER — Other Ambulatory Visit: Payer: Self-pay | Admitting: Allergy

## 2011-11-18 MED ORDER — SPIRONOLACTONE 25 MG PO TABS: ORAL_TABLET | ORAL | Status: DC

## 2011-11-18 NOTE — Telephone Encounter (Signed)
[facility] REQUESTING RX FOR SPIRONOLACT TAB PT HAS APPT FEB 2013

## 2011-11-30 ENCOUNTER — Ambulatory Visit (INDEPENDENT_AMBULATORY_CARE_PROVIDER_SITE_OTHER): Payer: Medicare PPO | Admitting: Internal Medicine

## 2011-11-30 ENCOUNTER — Encounter: Payer: Self-pay | Admitting: Internal Medicine

## 2011-11-30 VITALS — BP 144/68 | HR 68 | Temp 97.6°F | Ht 64.0 in | Wt 166.0 lb

## 2011-11-30 DIAGNOSIS — I10 Essential (primary) hypertension: Secondary | ICD-10-CM

## 2011-11-30 DIAGNOSIS — N189 Chronic kidney disease, unspecified: Secondary | ICD-10-CM

## 2011-11-30 DIAGNOSIS — K219 Gastro-esophageal reflux disease without esophagitis: Secondary | ICD-10-CM

## 2011-11-30 DIAGNOSIS — E559 Vitamin D deficiency, unspecified: Secondary | ICD-10-CM

## 2011-11-30 NOTE — Assessment & Plan Note (Signed)
Adequate control on present rx, reviewed level 59 on present rx > continue

## 2011-11-30 NOTE — Progress Notes (Signed)
Subjective:    Patient ID: [name], female    DOB: 04-29-1936    MRN: [id number]  HPI  34 yobf never smoker with morbid obesity complicated by hypertension and tendency to fluid retention controlled on spironolactone and hydrochlorothiazide daily.    November 18, 2009 ov cc doing well and denies any complaints . Needing tramadol and all her prns less using med calendar consistently.   05/18/2011 ov/[name] cc bp f/u no cc sob, swelling, multiple minor complaints but esp new x 48 R hip pain, minimally better with tylenol, thinks she "slept on it funny" no radiation to knee, no pain with cough - denies having med calendar or using it.   08/07/2011 f/u ov/[name] CPX forgot to fast  >>rx sulindac / needs new med calendar  08/24/2011 Follow up and Med review Pt returns for follow up and med review. We reviewed her medications and updated her medications w/ pt education. Her back and leg are feeling better. Did not use Sulindac very much .  She is fasting today and needs labs. rec No change rx. Follow med calendar    11/30/2011 f/u ov/[name] cc   throat clearing "comes and goes" - no new complaints today. No excess mucus, just a sense of too much throat mucus some better with prn allegra. No sob/wheeze.  Sleeping ok without nocturnal  or early am exacerbation  of respiratory  c/o's or need for noct saba. Also denies any obvious fluctuation of symptoms with weather or environmental changes or other aggravating or alleviating factors except as outlined above   ROS  At present neg for  any significant sore throat, dysphagia, itching, sneezing,  nasal congestion or excess/ purulent secretions,  fever, chills, sweats, unintended wt loss, pleuritic or exertional cp, hempoptysis, orthopnea pnd or leg swelling.  Also denies presyncope, palpitations, heartburn, abdominal pain, nausea, vomiting, diarrhea  or change in bowel or urinary habits, dysuria,hematuria,  rash, arthralgias, visual complaints,  headache, numbness weakness or ataxia.        Past Medical History:  Colon polyps, h/o - colonoscopy 04/08/2005  CHRONIC RHINITIS (ICD-472.0)  - tried immunotherapy 06/1999 MORBID OBESITY (ICD-278.01)  - Target wt = 163 for BMI < 30  DEGENERATIVE JOINT DISEASE (ICD-715.90).....................Marland KitchenMarland KitchenAlusio/ [facility]  HYPERTENSION (ICD-401.9)  CHRONIC RENAL INSUFFICIENCY  - Creat 1.5 February 12, 2010 , >1.3 July 02, 2010 (off NSAIDS)  HEALTH MAINTENANCE.........................................................Marland KitchenSem[place] 08/2007 - Pneumovax 11/04 age 76  - CPX 08/07/2011  - Med calendar lost 08/05/11 > referred back for new one>>>08/24/2011  GYN Care.................................................................................[facility]  - Bone density/mammography per gyn  -BMD 2011-nml  MEMORY LOSS  -MMSE July 01, 2010 >>30/30  -Clock Draw July 01, 2010 >>nml  -RPR -neg, B12/Folate nml    Past Surgical History:  Cataract durgery (rt eye) 2009  Hysterectomy 1980  Right TKR 2002  Left TKR 2003   Family History:  Positive for heart disease in remote members of her family  only. No breast or colon cancer in her family to her knowledge. Her  brother was a smoker had lung cancer.   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  News and Record previously Never smoker  No ETOH              Objective:   Physical Exam   wt 165 October 30, 2008 >>> 168 November 18, 2009 >169 08/24/2011 > 11/30/2011  166  Ambulatory healthy appearing in no  acute distress,  HEENT: edentulous turbinates, and orophanx. Nl external ear canals without cough reflex  Neck without JVD/Nodes/TM  Lungs clear to A and P bilaterally without cough on insp or exp maneuvers  RRR no s3 or murmur or increase in P2  Abd soft and benign with nl excursion in the supine position. No bruits or organomegaly  Ext warm without calf  tenderness, cyanosis clubbing  Skin warm and dry without lesions  MS pulses sym and intact, equal strength, nml gait. ,  Mild djd  changes in fingers           Assessment & Plan:

## 2011-11-30 NOTE — Patient Instructions (Addendum)
GERD (REFLUX)  is an extremely common cause of respiratory symptoms (like throat drainage) , many times with no significant heartburn at all.    It can be treated with medication, but also with lifestyle changes including avoidance of late meals, excessive alcohol, smoking cessation, and avoid fatty foods, chocolate, peppermint, colas, red wine, and acidic juices such as orange juice.  NO MINT OR MENTHOL PRODUCTS SO NO COUGH DROPS  USE SUGARLESS CANDY INSTEAD (jolley ranchers or Stover's)  NO OIL BASED VITAMINS - use powdered substitutes.    See calendar for specific medication instructions and bring it back for each and every office visit for every healthcare provider you see.  Without it,  you may not receive the best quality medical care that we feel you deserve.  You will note that the calendar groups together  your maintenance  medications that are timed at particular times of the day.  Think of this as your checklist for what your doctor has instructed you to do until your next evaluation to see what benefit  there is  to staying on a consistent group of medications intended to keep you well.  The other group at the bottom is entirely up to you to use as you see fit  for specific symptoms that may arise between visits that require you to treat them on an as needed basis.  Think of this as your action plan or "what if" list.   Separating the top medications from the bottom group is fundamental to providing you adequate care going forward.    If throat clearing persists ok to try pepcid  Ac 20 mg after bfast and at bedtime and call if not improving.  Please schedule a follow up visit in 3 months but call sooner if needed fasting.

## 2011-11-30 NOTE — Assessment & Plan Note (Signed)
Lab Results  Component Value Date   CREATININE 1.6* 08/24/2011   CREATININE 1.6* 10/24/2010   CREATININE 1.3* 07/01/2010     Adequate control on present rx, reviewed

## 2011-11-30 NOTE — Assessment & Plan Note (Signed)
Adequate control on present rx, reviewed  

## 2011-11-30 NOTE — Assessment & Plan Note (Addendum)
"  throat clearing "  Classic Upper airway cough syndrome, so named because it's frequently impossible to sort out how much is  CR/sinusitis with freq throat clearing (which can be related to primary GERD)   vs  causing  secondary (" extra esophageal")  GERD from wide swings in gastric pressure that occur with throat clearing, often  promoting self use of mint and menthol lozenges that reduce the lower esophageal sphincter tone and exacerbate the problem further in a cyclical fashion.   These are the same pts who not infrequently have failed to tolerate ace inhibitors,  dry powder inhalers or biphosphonates or report having reflux symptoms that don't respond to standard doses of PPI , and are easily confused as having aecopd or asthma flares,   For now rx with diet and prn h2 but if persists consider ppi q am ac and h2 hs x 6 week trial or change allegra to 1st gen H1 per guidelines

## 2012-02-17 ENCOUNTER — Telehealth: Payer: Self-pay | Admitting: Internal Medicine

## 2012-02-17 MED ORDER — SULINDAC 200 MG PO TABS: ORAL_TABLET | ORAL | Status: DC

## 2012-02-17 NOTE — Telephone Encounter (Signed)
Pt informed that refill for Sulindac was sent to pharmacy.

## 2012-03-02 ENCOUNTER — Ambulatory Visit: Payer: Medicare PPO | Admitting: Internal Medicine

## 2012-03-11 ENCOUNTER — Ambulatory Visit (INDEPENDENT_AMBULATORY_CARE_PROVIDER_SITE_OTHER): Payer: Medicare PPO | Admitting: Internal Medicine

## 2012-03-11 ENCOUNTER — Other Ambulatory Visit (INDEPENDENT_AMBULATORY_CARE_PROVIDER_SITE_OTHER): Payer: Medicare PPO

## 2012-03-11 ENCOUNTER — Encounter: Payer: Self-pay | Admitting: Internal Medicine

## 2012-03-11 VITALS — BP 102/60 | HR 70 | Temp 97.7°F | Ht 64.0 in | Wt 166.8 lb

## 2012-03-11 DIAGNOSIS — R0600 Dyspnea, unspecified: Secondary | ICD-10-CM

## 2012-03-11 DIAGNOSIS — R05 Cough: Secondary | ICD-10-CM

## 2012-03-11 DIAGNOSIS — R0989 Other specified symptoms and signs involving the circulatory and respiratory systems: Secondary | ICD-10-CM

## 2012-03-11 DIAGNOSIS — I10 Essential (primary) hypertension: Secondary | ICD-10-CM

## 2012-03-11 DIAGNOSIS — N189 Chronic kidney disease, unspecified: Secondary | ICD-10-CM

## 2012-03-11 DIAGNOSIS — R0609 Other forms of dyspnea: Secondary | ICD-10-CM

## 2012-03-11 DIAGNOSIS — R059 Cough, unspecified: Secondary | ICD-10-CM | POA: Insufficient documentation

## 2012-03-11 LAB — BASIC METABOLIC PANEL
BUN: 35 mg/dL — ABNORMAL HIGH (ref 6–23)
CO2: 27 mEq/L (ref 19–32)
Calcium: 9.8 mg/dL (ref 8.4–10.5)
Chloride: 107 mEq/L (ref 96–112)
Creatinine, Ser: 1.8 mg/dL — ABNORMAL HIGH (ref 0.4–1.2)
GFR: 34.45 mL/min — ABNORMAL LOW (ref >60.00)
Glucose, Bld: 64 mg/dL — ABNORMAL LOW (ref 70–99)
Potassium: 4.6 mEq/L (ref 3.5–5.1)
Sodium: 141 mEq/L (ref 135–145)

## 2012-03-11 LAB — CBC WITH DIFFERENTIAL/PLATELET
Basophils Absolute: 0 10*3/uL (ref 0.0–0.1)
Basophils Relative: 0.8 % (ref 0.0–3.0)
Eosinophils Absolute: 0.3 10*3/uL (ref 0.0–0.7)
Eosinophils Relative: 5.3 % — ABNORMAL HIGH (ref 0.0–5.0)
HCT: 33.6 % — ABNORMAL LOW (ref 36.0–46.0)
Hemoglobin: 11.1 g/dL — ABNORMAL LOW (ref 12.0–15.0)
Lymphocytes Relative: 18.1 % (ref 12.0–46.0)
Lymphs Abs: 1.1 10*3/uL (ref 0.7–4.0)
MCHC: 32.9 g/dL (ref 30.0–36.0)
MCV: 100.5 fl — ABNORMAL HIGH (ref 78.0–100.0)
Monocytes Absolute: 0.6 10*3/uL (ref 0.1–1.0)
Monocytes Relative: 9.4 % (ref 3.0–12.0)
Neutro Abs: 4 10*3/uL (ref 1.4–7.7)
Neutrophils Relative %: 66.4 % (ref 43.0–77.0)
Platelets: 230 10*3/uL (ref 150.0–400.0)
RBC: 3.34 Mil/uL — ABNORMAL LOW (ref 3.87–5.11)
RDW: 13 % (ref 11.5–14.6)
WBC: 6 10*3/uL (ref 4.5–10.5)

## 2012-03-11 LAB — BRAIN NATRIURETIC PEPTIDE: Pro B Natriuretic peptide (BNP): 21 pg/mL (ref 0.0–100.0)

## 2012-03-11 LAB — TSH: TSH: 2.37 u[IU]/mL (ref 0.35–5.50)

## 2012-03-11 MED ORDER — FAMOTIDINE 20 MG PO TABS: ORAL_TABLET | ORAL | Status: DC

## 2012-03-11 MED ORDER — OMEPRAZOLE 20 MG PO CPDR: 20.0000 mg | DELAYED_RELEASE_CAPSULE | Freq: Every day | ORAL | Status: DC

## 2012-03-11 NOTE — Patient Instructions (Addendum)
GERD (REFLUX)  is an extremely common cause of respiratory symptoms (like throat drainage) , many times with no significant heartburn at all.    It can be treated with medication, but also with lifestyle changes including avoidance of late meals, excessive alcohol, smoking cessation, and avoid fatty foods, chocolate, peppermint, colas, red wine, and acidic juices such as orange juice.  NO MINT OR MENTHOL PRODUCTS SO NO COUGH DROPS  USE SUGARLESS CANDY INSTEAD (jolley ranchers or Stover's)  NO OIL BASED VITAMINS - use powdered substitutes.   Try prilosec 20mg   Take 30-60 min before first meal of the day and Pepcid 20 mg one bedtime until you return  See Tammy NP w/in  2 weeks with all your medications, even over the counter meds, separated in two separate bags, the ones you take no matter what vs the ones you stop once you feel better and take only as needed when you feel you need them.   Tammy  will generate for you a new user friendly medication calendar that will put [place] all on the same page re: your medication use.     Without this process, it simply isn't possible to assure that we are providing  your outpatient care  with  the attention to detail we feel you deserve.   If we cannot assure that you're getting that kind of care,  then we cannot manage your problem effectively from this clinic.  Once you have seen Tammy and we are sure that we're all on the same page with your medication use she will arrange follow up with me.

## 2012-03-11 NOTE — Assessment & Plan Note (Signed)
Adequate control on present rx, reviewed  

## 2012-03-11 NOTE — Assessment & Plan Note (Signed)
The most common causes of chronic cough in immunocompetent adults include the following: upper airway cough syndrome (UACS), previously referred to as postnasal drip syndrome (PNDS), which is caused by variety of rhinosinus conditions; (2) asthma; (3) GERD; (4) chronic bronchitis from cigarette smoking or other inhaled environmental irritants; (5) nonasthmatic eosinophilic bronchitis; and (6) bronchiectasis.   These conditions, singly or in combination, have accounted for up to 94% of the causes of chronic cough in prospective studies.   Other conditions have constituted no >6% of the causes in prospective studies These have included bronchogenic carcinoma, chronic interstitial pneumonia, sarcoidosis, left ventricular failure, ACEI-induced cough, and aspiration from a condition associated with pharyngeal dysfunction.   .Chronic cough is often simultaneously caused by more than one condition. A single cause has been found from 38 to 82% of the time, multiple causes from 18 to 62%. Multiply caused cough has been the result of three diseases up to 42% of the time.    This is most likley  Classic Upper airway cough syndrome, so named because it's frequently impossible to sort out how much is  CR/sinusitis with freq throat clearing (which can be related to primary GERD)   vs  causing  secondary (" extra esophageal")  GERD from wide swings in gastric pressure that occur with throat clearing, often  promoting self use of mint and menthol lozenges that reduce the lower esophageal sphincter tone and exacerbate the problem further in a cyclical fashion.   These are the same pts (now being labeled as having "irritable larynx syndrome" by some cough centers) who not infrequently have a history of having failed to tolerate ace inhibitors,  dry powder inhalers or biphosphonates or report having atypical reflux symptoms that don't respond to standard doses of PPI , and are easily confused as having aecopd or asthma  flares by even experienced allergists/ pulmonologists.   For now max rx for gerd then regroup p full medication reconciliation  To keep things simple, I have asked the patient to first separate medicines that are perceived as maintenance, that is to be taken daily "no matter what", from those medicines that are taken on only on an as-needed basis and I have given the patient examples of both, and then return to see our NP to generate a  detailed  medication calendar which should be followed until the next physician sees the patient and updates it.

## 2012-03-11 NOTE — Assessment & Plan Note (Signed)
Lab Results  Component Value Date   CREATININE 1.8* 03/11/2012   CREATININE 1.6* 08/24/2011   CREATININE 1.6* 10/24/2010    Previously improved off nsaids and since we haven't done med reconciliation yet it may be she's taking them again but if not next step is renal u/s

## 2012-03-11 NOTE — Progress Notes (Signed)
Subjective:    Patient ID: SUMI AX, female    DOB: January 01, 1936    MRN: [id number]  HPI  54 yobf never smoker with morbid obesity complicated by hypertension and tendency to fluid retention controlled on spironolactone and hydrochlorothiazide daily.    November 18, 2009 ov cc doing well and denies any complaints . Needing tramadol and all her prns less using med calendar consistently.   05/18/2011 ov/Daimien Patmon cc bp f/u no cc sob, swelling, multiple minor complaints but esp new x 48 R hip pain, minimally better with tylenol, thinks she "slept on it funny" no radiation to knee, no pain with cough - denies having med calendar or using it.   08/07/2011 f/u ov/Lameshia Hypolite CPX forgot to fast  >>rx sulindac / needs new med calendar  08/24/2011 Follow up and Med review Pt returns for follow up and med review. We reviewed her medications and updated her medications w/ pt education. Her back and leg are feeling better. Did not use Sulindac very much .  She is fasting today and needs labs. rec No change rx. Follow med calendar    11/30/2011 f/u ov/Patricia Perales cc   throat clearing "comes and goes" - no new complaints today. No excess mucus, just a sense of too much throat mucus some better with prn allegra. No sob/wheeze. rec GERD diet reviewed See calendar for specific medication instructions  If throat clearing persists ok to try pepcid  Ac 20 mg after bfast and at bedtime and call if not improving.   03/11/2012 f/u ov/Lamichael Youkhana did not follow any of the instructions, lost calendar,  and now cc doe with dry cough and audible wheeze daily, no overt sinus or hb symptoms, no variability or pattern x much more day than night.   Sleeping ok without nocturnal  or early am exacerbation  of respiratory  c/o's or need for noct saba. Also denies any obvious fluctuation of symptoms with weather or environmental changes or other aggravating or alleviating factors except as outlined above   ROS  At present neg for  any  significant sore throat, dysphagia, itching, sneezing,  nasal congestion or excess/ purulent secretions,  fever, chills, sweats, unintended wt loss, pleuritic or exertional cp, hempoptysis, orthopnea pnd or leg swelling.  Also denies presyncope, palpitations, heartburn, abdominal pain, nausea, vomiting, diarrhea  or change in bowel or urinary habits, dysuria,hematuria,  rash, arthralgias, visual complaints, headache, numbness weakness or ataxia.        Past Medical History:  Colon polyps, h/o - colonoscopy 04/08/2005  CHRONIC RHINITIS (ICD-472.0)  - tried immunotherapy 06/1999 MORBID OBESITY (ICD-278.01)  - Target wt = 163 for BMI < 30  DEGENERATIVE JOINT DISEASE (ICD-715.90).....................Marland KitchenMarland KitchenAlusio/ [facility]  HYPERTENSION (ICD-401.9)  CHRONIC RENAL INSUFFICIENCY  - Creat 1.5 February 12, 2010 , >1.3 July 02, 2010 (off NSAIDS)  HEALTH MAINTENANCE.........................................................[place] 08/2007 - Pneumovax 11/04 age 76  - CPX 08/07/2011  - Med calendar lost 08/05/11 > referred back for new one>>>08/24/2011  GYN Care.................................................................................[facility]  - Bone density/mammography per gyn  -BMD 2011-nml  MEMORY LOSS  -MMSE July 01, 2010 >>30/30  -Clock Draw July 01, 2010 >>nml  -RPR -neg, B12/Folate nml    Past Surgical History:  Cataract durgery (rt eye) 2009  Hysterectomy 1980  Right TKR 2002  Left TKR 2003    Family History:  Positive for heart disease in remote members of her family  only. No breast or colon cancer in her family to her knowledge. Her  brother was  a smoker had lung cancer.    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  News and Record previously Never smoker  No ETOH              Objective:   Physical Exam   wt 165 October 30, 2008 >>> 168 November 18, 2009 >169 08/24/2011 >  11/30/2011  166 > 03/11/2012  166 Mod depressed amb bf nad with freq throat clearing during interview  HEENT: edentulous turbinates, and orophanx. Nl external ear canals without cough reflex  Neck without JVD/Nodes/TM  Lungs completely clear to A and P bilaterally without cough on insp or exp maneuvers, trace pseudowheeze. RRR no s3 or murmur or increase in P2  Abd soft and benign with nl excursion in the supine position. No bruits or organomegaly  Ext warm without calf tenderness, cyanosis clubbing  Skin warm and dry without lesions  MS pulses sym and intact, equal strength, nml gait. ,  Mild djd  changes in fingers           Assessment & Plan:

## 2012-03-14 ENCOUNTER — Telehealth: Payer: Self-pay | Admitting: Internal Medicine

## 2012-03-14 NOTE — Telephone Encounter (Signed)
LMTCB

## 2012-03-15 NOTE — Telephone Encounter (Signed)
lmomtcb x 2  

## 2012-03-16 ENCOUNTER — Telehealth: Payer: Self-pay | Admitting: Internal Medicine

## 2012-03-16 NOTE — Telephone Encounter (Signed)
Spoke with pt and notified of results per Dr. Wert. Pt verbalized understanding and denied any questions. 

## 2012-03-16 NOTE — Progress Notes (Signed)
Quick Note:  Spoke with pt and notified of results per Dr. Wert. Pt verbalized understanding and denied any questions.  ______ 

## 2012-03-16 NOTE — Telephone Encounter (Signed)
lmomtcb advising to pls call back if anything further is needed regarding this msg to leave a new msg for the triage nurse.  Per triage protocol, will sign off on msg.

## 2012-03-16 NOTE — Telephone Encounter (Signed)
Notes Recorded by Tanda Rockers, MD on 03/11/2012 at 1:33 PM Call patient : Studies are unremarkable x for kidney fxn still abn though not much changed - tell her " be sure not to take anything not on the medication list and bring all meds including the over the counters with you to see Tammy NP"   --lmomtcb x1 for pt

## 2012-03-25 ENCOUNTER — Encounter: Payer: Self-pay | Admitting: Adult Health

## 2012-03-25 ENCOUNTER — Ambulatory Visit (INDEPENDENT_AMBULATORY_CARE_PROVIDER_SITE_OTHER): Payer: Medicare PPO | Admitting: Adult Health

## 2012-03-25 VITALS — BP 130/70 | HR 61 | Temp 97.4°F | Ht 64.0 in | Wt 167.2 lb

## 2012-03-25 DIAGNOSIS — R059 Cough, unspecified: Secondary | ICD-10-CM

## 2012-03-25 DIAGNOSIS — I10 Essential (primary) hypertension: Secondary | ICD-10-CM

## 2012-03-25 DIAGNOSIS — N189 Chronic kidney disease, unspecified: Secondary | ICD-10-CM

## 2012-03-25 DIAGNOSIS — R05 Cough: Secondary | ICD-10-CM

## 2012-03-25 NOTE — Assessment & Plan Note (Addendum)
Controlled  Return in 2 weeks for recheck as aldactone was stopped today  Patient's medications were reviewed today and patient education was given. Computerized medication calendar was adjusted/completed

## 2012-03-25 NOTE — Progress Notes (Signed)
Subjective:    Patient ID: [name], female    DOB: December 08, 1935    MRN: [id number]  HPI  49 yobf never smoker with morbid obesity complicated by hypertension and tendency to fluid retention controlled on spironolactone and hydrochlorothiazide daily.    November 18, 2009 ov cc doing well and denies any complaints . Needing tramadol and all her prns less using med calendar consistently.   05/18/2011 ov/Wert cc bp f/u no cc sob, swelling, multiple minor complaints but esp new x 48 R hip pain, minimally better with tylenol, thinks she "slept on it funny" no radiation to knee, no pain with cough - denies having med calendar or using it.   08/07/2011 f/u ov/Wert CPX forgot to fast  >>rx sulindac / needs new med calendar  08/24/2011 Follow up and Med review Pt returns for follow up and med review. We reviewed her medications and updated her medications w/ pt education. Her back and leg are feeling better. Did not use Sulindac very much .  She is fasting today and needs labs. rec No change rx. Follow med calendar    11/30/2011 f/u ov/Wert cc   throat clearing "comes and goes" - no new complaints today. No excess mucus, just a sense of too much throat mucus some better with prn allegra. No sob/wheeze. rec GERD diet reviewed See calendar for specific medication instructions  If throat clearing persists ok to try pepcid  Ac 20 mg after bfast and at bedtime and call if not improving.   03/11/2012 f/u ov/Wert did not follow any of the instructions, lost calendar,  and now cc doe with dry cough and audible wheeze daily, no overt sinus or hb symptoms, no variability or pattern x much more day than night.  >rx prilosec/Pepcid   03/25/2012 Follow up and med review  Patient returns for a two-week followup visit and medication review We reviewed all her medications and organized her medication calendar with patient education. Last visit. Patient was having more difficulty with cough and wheezing.  Patient was treated for  Upper airway. Patient secondary to reflux with Prilosec and Pepcid. Patient has noticed improvement with decreased cough and wheezing. She feels that her cough is almost gone completely. Labs done last visit showed worsening renal insufficiency with serum creatinine up to 1.8. We reviewed this in detail today and have advised that she will stop her nonsteroidals and her spironolactone. She is agreed to come back for short-term followup in 2 weeks to have repeat labs. She denies any exertional chest pain, hemoptysis, orthopnea, PND, or increased leg swelling      Past Medical History:  Colon polyps, h/o - colonoscopy 04/08/2005  CHRONIC RHINITIS (ICD-472.0)  - tried immunotherapy 06/1999 MORBID OBESITY (ICD-278.01)  - Target wt = 163 for BMI < 30  DEGENERATIVE JOINT DISEASE (ICD-715.90).....................Marland KitchenMarland KitchenAlusio/ [facility]  HYPERTENSION (ICD-401.9)  CHRONIC RENAL INSUFFICIENCY  - Creat 1.5 February 12, 2010 , >1.3 July 02, 2010 (off NSAIDS)  HEALTH MAINTENANCE.........................................................[facility] [place] [facility] - Pneumovax 11/04 age 26  - CPX 08/07/2011  - Med calendar lost 08/05/11 > referred back for new one>>>08/24/2011 , 03/25/2012  -colon 2011 -[facility]  GYN Care.................................................................................[facility]  - Bone density/mammography per gyn  -BMD 2011-nml  MEMORY LOSS  -MMSE July 01, 2010 >>30/30  -Clock Draw July 01, 2010 >>nml  -RPR -neg, B12/Folate nml    Past Surgical History:  Cataract durgery (rt eye) 2009  Hysterectomy 1980  Right TKR 2002  Left TKR 2003  Family History:  Positive for heart disease in remote members of her family  only. No breast or colon cancer in her family to her knowledge. Her  brother was a smoker had lung cancer.    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   News and Record previously Never smoker  No ETOH      ROS:  Constitutional:   No  weight loss, night sweats,  Fevers, chills,  +fatigue, or  lassitude.  HEENT:   No headaches,  Difficulty swallowing,  Tooth/dental problems, or  Sore throat,                No sneezing, itching, ear ache, nasal congestion, post nasal drip,   CV:  No chest pain,  Orthopnea, PND,  , anasarca, dizziness, palpitations, syncope.   GI  No heartburn, indigestion, abdominal pain, nausea, vomiting, diarrhea, change in bowel habits, loss of appetite, bloody stools.   Resp:    No excess mucus, no productive cough,  No non-productive cough,  No coughing up of blood.  No change in color of mucus.  No wheezing.  No chest wall deformity  Skin: no rash or lesions.  GU: no dysuria, change in color of urine, no urgency or frequency.  No flank pain, no hematuria   MS:  No joint  swelling.  No decreased range of motion.     Psych:  No change in mood or affect. No depression or anxiety.  No memory loss.             Objective:   Physical Exam   wt 165 October 30, 2008 >>> 168 November 18, 2009 >169 08/24/2011 > 11/30/2011  166 > 03/11/2012  166 >167 03/25/2012    HEENT:nml  turbinates, and orophanx. Nl external ear canals without cough reflex  Neck without JVD/Nodes/TM  Lungs completely clear to A and P bilaterally without cough on insp or exp maneuvers, no wheezing or pseudowheeze. RRR no s3 or murmur or increase in P2  Abd soft and benign with nl excursion in the supine position. No bruits or organomegaly  Ext warm without calf tenderness, cyanosis clubbing  Skin warm and dry without lesions  MS pulses sym and intact, equal strength, nml gait. ,  Mild djd  changes in fingers           Assessment & Plan:

## 2012-03-25 NOTE — Assessment & Plan Note (Signed)
Worsening renal fxn on NSAIDS Stop sulindac and aldactone  Recheck bmet in 2 weeks  May need nephrology referral if not improving w/ low GFR - pt aware

## 2012-03-25 NOTE — Assessment & Plan Note (Signed)
Improved with tx aimed at GERD tx

## 2012-03-25 NOTE — Patient Instructions (Signed)
Stop Sulindac  Stop Spironolactone.  Follow up in 2  weeks with lab-bmet  May use Tramadol As needed  Pain. Follow med calendar closely and bring to each visit.  Please contact office for sooner follow up if symptoms do not improve or worsen or seek emergency care

## 2012-03-28 ENCOUNTER — Telehealth: Payer: Self-pay | Admitting: Internal Medicine

## 2012-03-28 NOTE — Progress Notes (Signed)
Addended by: Parke Poisson E on: 03/28/2012 05:41 PM   Modules accepted: Orders

## 2012-03-29 NOTE — Telephone Encounter (Signed)
lmomtcb x1 

## 2012-03-29 NOTE — Telephone Encounter (Signed)
Pt returned triage's call.  [name] ° °

## 2012-03-29 NOTE — Telephone Encounter (Signed)
lmomtcb x1 for pt 

## 2012-03-30 MED ORDER — TRAMADOL HCL 50 MG PO TABS: 50.0000 mg | ORAL_TABLET | Freq: Three times a day (TID) | ORAL | Status: DC | PRN

## 2012-03-30 NOTE — Telephone Encounter (Signed)
Pt requesting a 90 day supply so [facility] no refills instead of sending 30 day with 5 refills. 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

## 2012-03-30 NOTE — Telephone Encounter (Signed)
TP, please advise if you were going to send refill for patient. Thanks.

## 2012-03-30 NOTE — Telephone Encounter (Signed)
Sorry it was suppose to be sent  Did it not go , if not  Please send with 5 refills

## 2012-04-06 ENCOUNTER — Ambulatory Visit (INDEPENDENT_AMBULATORY_CARE_PROVIDER_SITE_OTHER): Payer: Medicare PPO | Admitting: Adult Health

## 2012-04-06 ENCOUNTER — Other Ambulatory Visit (INDEPENDENT_AMBULATORY_CARE_PROVIDER_SITE_OTHER): Payer: Medicare PPO

## 2012-04-06 VITALS — BP 110/60 | HR 61 | Temp 97.0°F | Ht 64.0 in | Wt 164.2 lb

## 2012-04-06 DIAGNOSIS — N189 Chronic kidney disease, unspecified: Secondary | ICD-10-CM

## 2012-04-06 LAB — BASIC METABOLIC PANEL
BUN: 22 mg/dL (ref 6–23)
CO2: 28 mEq/L (ref 19–32)
Calcium: 9.5 mg/dL (ref 8.4–10.5)
Chloride: 100 mEq/L (ref 96–112)
Creatinine, Ser: 1.3 mg/dL — ABNORMAL HIGH (ref 0.4–1.2)
GFR: 53.32 mL/min — ABNORMAL LOW (ref >60.00)
Glucose, Bld: 104 mg/dL — ABNORMAL HIGH (ref 70–99)
Potassium: 3.5 mEq/L (ref 3.5–5.1)
Sodium: 138 mEq/L (ref 135–145)

## 2012-04-06 NOTE — Patient Instructions (Addendum)
Remain off  Sulindac  And  Spironolactone.  Avoid NSIADS - motrin, advil , aleve, etc.  follow up Dr. [name]  In 3 months and As needed   Please contact office for sooner follow up if symptoms do not improve or worsen or seek emergency care

## 2012-04-06 NOTE — Assessment & Plan Note (Signed)
Renal insuffiency improved off nsaids/aldactone  Cont on current regimen   Plan;  Remain off  Sulindac  And  Spironolactone.  Avoid NSIADS - motrin, advil , aleve, etc.  follow up Dr. [name]  In 3 months and As needed   Please contact office for sooner follow up if symptoms do not improve or worsen or seek emergency care

## 2012-04-06 NOTE — Progress Notes (Signed)
Subjective:    Patient ID: MONTAVIA WERRE, female    DOB: [birth year]    MRN: [id number]  HPI  21 yobf never smoker with morbid obesity complicated by hypertension and tendency to fluid retention controlled on spironolactone and hydrochlorothiazide daily.    November 18, 2009 ov cc doing well and denies any complaints . Needing tramadol and all her prns less using med calendar consistently.   05/18/2011 ov/Wert cc bp f/u no cc sob, swelling, multiple minor complaints but esp new x 48 R hip pain, minimally better with tylenol, thinks she "slept on it funny" no radiation to knee, no pain with cough - denies having med calendar or using it.   08/07/2011 f/u ov/Wert CPX forgot to fast  >>rx sulindac / needs new med calendar  08/24/2011 Follow up and Med review Pt returns for follow up and med review. We reviewed her medications and updated her medications w/ pt education. Her back and leg are feeling better. Did not use Sulindac very much .  She is fasting today and needs labs. rec No change rx. Follow med calendar    11/30/2011 f/u ov/Wert cc   throat clearing "comes and goes" - no new complaints today. No excess mucus, just a sense of too much throat mucus some better with prn allegra. No sob/wheeze. rec GERD diet reviewed See calendar for specific medication instructions  If throat clearing persists ok to try pepcid  Ac 20 mg after bfast and at bedtime and call if not improving.   03/11/2012 f/u ov/Wert did not follow any of the instructions, lost calendar,  and now cc doe with dry cough and audible wheeze daily, no overt sinus or hb symptoms, no variability or pattern x much more day than night.  >rx prilosec/Pepcid   03/25/2012 Follow up and med review  Patient returns for a two-week followup visit and medication review We reviewed all her medications and organized her medication calendar with patient education. Last visit. Patient was having more difficulty with cough and wheezing.  Patient was treated for  Upper airway. Patient secondary to reflux with Prilosec and Pepcid. Patient has noticed improvement with decreased cough and wheezing. She feels that her cough is almost gone completely. Labs done last visit showed worsening renal insufficiency with serum creatinine up to 1.8. We reviewed this in detail today and have advised that she will stop her nonsteroidals and her spironolactone. She is agreed to come back for short-term followup in 2 weeks to have repeat labs. She denies any exertional chest pain, hemoptysis, orthopnea, PND, or increased leg swelling >>sulindac and aldactone stopped   04/06/2012 Follow up  Returns for labs check  Renal insufficiency w/ slow tr up of scr .  Last ov , sulindac and aldactone stopped  No increased edema. B/p controlled.  Today scr is 1.3  No chest pain or edema .       Past Medical History:  Colon polyps, h/o - colonoscopy 04/08/2005  CHRONIC RHINITIS (ICD-472.0)  - tried immunotherapy 06/1999 MORBID OBESITY (ICD-278.01)  - Target wt = 163 for BMI < 30  DEGENERATIVE JOINT DISEASE (ICD-715.90).....................Marland KitchenMarland KitchenAlusio/ [facility]  HYPERTENSION (ICD-401.9)  CHRONIC RENAL INSUFFICIENCY  - Creat 1.5 February 12, 2010 , >1.3 July 02, 2010 (off NSAIDS)  HEALTH MAINTENANCE....................................................[place] [facility] - Pneumovax 11/04 age 76  - CPX 08/07/2011  - Med calendar lost 08/05/11 > referred back for new one>>>08/24/2011 , 03/25/2012  -colon 2011 -[facility]  GYN Care.................................................................................[facility]  - Bone density/mammography  per gyn  -BMD 2011-nml  MEMORY LOSS  -MMSE July 01, 2010 >>30/30  -Clock Draw July 01, 2010 >>nml  -RPR -neg, B12/Folate nml    Past Surgical History:  Cataract durgery (rt eye) 2009  Hysterectomy 1980  Right TKR 2002  Left TKR 2003    Family History:  Positive for heart disease in  remote members of her family  only. No breast or colon cancer in her family to her knowledge. Her  brother was a smoker had lung cancer.    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  News and Record previously Never smoker  No ETOH      ROS:  Constitutional:   No  weight loss, night sweats,  Fevers, chills,  +fatigue, or  lassitude.  HEENT:   No headaches,  Difficulty swallowing,  Tooth/dental problems, or  Sore throat,                No sneezing, itching, ear ache, nasal congestion, post nasal drip,   CV:  No chest pain,  Orthopnea, PND,  , anasarca, dizziness, palpitations, syncope.   GI  No heartburn, indigestion, abdominal pain, nausea, vomiting, diarrhea, change in bowel habits, loss of appetite, bloody stools.   Resp:    No excess mucus, no productive cough,  No non-productive cough,  No coughing up of blood.  No change in color of mucus.  No wheezing.  No chest wall deformity  Skin: no rash or lesions.  GU: no dysuria, change in color of urine, no urgency or frequency.  No flank pain, no hematuria   MS:  No joint  swelling.  No decreased range of motion.     Psych:  No change in mood or affect. No depression or anxiety.  No memory loss.             Objective:   Physical Exam   wt 165 October 30, 2008 >>> 168 November 18, 2009 >169 08/24/2011 > 11/30/2011  166 > 03/11/2012  166 >167 03/25/2012 >>164 04/06/2012    HEENT:nml  turbinates, and orophanx. Nl external ear canals without cough reflex  Neck without JVD/Nodes/TM  Lungs completely clear to A and P bilaterally without cough on insp or exp maneuvers, no wheezing or pseudowheeze. RRR no s3 or murmur or increase in P2  Abd soft and benign with nl excursion in the supine position. No bruits or organomegaly  Ext warm without calf tenderness, cyanosis clubbing  Skin warm and dry without lesions  MS pulses sym and intact, equal strength, nml  gait. ,  Mild djd  changes in fingers           Assessment & Plan:

## 2012-04-08 ENCOUNTER — Ambulatory Visit: Payer: Medicare PPO | Admitting: Adult Health

## 2012-04-11 ENCOUNTER — Telehealth: Payer: Self-pay | Admitting: Internal Medicine

## 2012-04-11 NOTE — Telephone Encounter (Signed)
Spoke with pt. She states that her hoarseness seems worse since the last ov 04-06-12. She states that her voice goes in and out, but denies any cough, dysphagia, sore throat. Would like to know MW recs for this. Please advise, thanks!

## 2012-04-11 NOTE — Telephone Encounter (Signed)
Probably related to gerd, make sure she's taking meds as listed and ov one week if not better.

## 2012-04-12 NOTE — Telephone Encounter (Signed)
Spoke with pt and notified of recs per MW. She states that she is taking meds as prescribed and will call back in 1 wk if no better for ov. Nothing further needed per pt.

## 2012-05-31 ENCOUNTER — Telehealth: Payer: Self-pay | Admitting: Internal Medicine

## 2012-05-31 NOTE — Telephone Encounter (Signed)
[name] called upset about her co-pay being 40 dollars to see Dr. [name]. And verbalized that she was not able to pay that amount for her primary care. Her primary care co-pay is $15.00 but her insurance company said Dr. [name] was not in their system as primary care and she would have to pay the $40.00. Advised [name] that Dr. [name] has agreed to adjust the bills of his patients with this issue, and she would only need to pay the $15.00. She would need to bring me the bills to be sent for adjustment in the future when she gets them and  I would send a message to the billing office related to the current balance due. After this... She explained that her Son [name], was found dead in his bathroom on the toilet 3 week ago and she is really having a hard time. Stress of the funeral and legal issues are overwhelming. She developed numbness to her right arm and was seen at Urgent Care Sunday. She was advised to follow up with her primary care doctor due to an elevated Blood Pressure. She did not want to make an appointment because she wants to be available to help her granddaughter. Encouraged her to make appointment to see Dr. [name] and work around legal appointments, she would be better able to help if she was healthy and feeling better physically. Patient stated she would call back later for and appointment.

## 2012-06-11 ENCOUNTER — Other Ambulatory Visit: Payer: Self-pay | Admitting: Adult Health

## 2012-06-15 NOTE — Telephone Encounter (Signed)
Refill request received for Tramadol. Patient last seen 04/06/12. Please advise if ok to refill. Thanks.

## 2012-06-17 ENCOUNTER — Telehealth: Payer: Self-pay | Admitting: Internal Medicine

## 2012-06-17 MED ORDER — TRAMADOL HCL 50 MG PO TABS: 50.0000 mg | ORAL_TABLET | Freq: Three times a day (TID) | ORAL | Status: DC | PRN

## 2012-06-17 NOTE — Telephone Encounter (Signed)
Refill has been sent in to the pharmacy for the pt.

## 2012-06-28 ENCOUNTER — Ambulatory Visit: Payer: Medicare PPO | Admitting: Internal Medicine

## 2012-06-29 ENCOUNTER — Ambulatory Visit (INDEPENDENT_AMBULATORY_CARE_PROVIDER_SITE_OTHER): Payer: Medicare PPO | Admitting: Internal Medicine

## 2012-06-29 ENCOUNTER — Encounter: Payer: Self-pay | Admitting: Internal Medicine

## 2012-06-29 VITALS — BP 130/68 | HR 61 | Temp 98.5°F | Ht 62.25 in | Wt 162.0 lb

## 2012-06-29 DIAGNOSIS — R05 Cough: Secondary | ICD-10-CM

## 2012-06-29 DIAGNOSIS — N189 Chronic kidney disease, unspecified: Secondary | ICD-10-CM

## 2012-06-29 DIAGNOSIS — D649 Anemia, unspecified: Secondary | ICD-10-CM

## 2012-06-29 DIAGNOSIS — R51 Headache: Secondary | ICD-10-CM

## 2012-06-29 DIAGNOSIS — R059 Cough, unspecified: Secondary | ICD-10-CM

## 2012-06-29 DIAGNOSIS — I10 Essential (primary) hypertension: Secondary | ICD-10-CM

## 2012-06-29 NOTE — Patient Instructions (Addendum)
See calendar for specific medication instructions and bring it back for each and every office visit for every healthcare provider you see.  Without it,  you may not receive the best quality medical care that we feel you deserve.  You will note that the calendar groups together  your maintenance  medications that are timed at particular times of the day.  Think of this as your checklist for what your doctor has instructed you to do until your next evaluation to see what benefit  there is  to staying on a consistent group of medications intended to keep you well.  The other group at the bottom is entirely up to you to use as you see fit  for specific symptoms that may arise between visits that require you to treat them on an as needed basis.  Think of this as your action plan or "what if" list.   Separating the top medications from the bottom group is fundamental to providing you adequate care going forward.    Please schedule a follow up visit in 3 months but call sooner if needed for CPX 

## 2012-06-29 NOTE — Progress Notes (Signed)
Subjective:    Patient ID: [name], female    DOB: 1935-12-18    MRN: [id number]  HPI  20 yobf never smoker with morbid obesity complicated by hypertension and tendency to fluid retention controlled on spironolactone and hydrochlorothiazide daily.    November 18, 2009 ov cc doing well and denies any complaints . Needing tramadol and all her prns less using med calendar consistently.   05/18/2011 ov/[name] cc bp f/u no cc sob, swelling, multiple minor complaints but esp new x 48 R hip pain, minimally better with tylenol, thinks she "slept on it funny" no radiation to knee, no pain with cough - denies having med calendar or using it.   08/07/2011 f/u ov/[name] CPX forgot to fast  >>rx sulindac / needs new med calendar  08/24/2011 Follow up and Med review Pt returns for follow up and med review. We reviewed her medications and updated her medications w/ pt education. Her back and leg are feeling better. Did not use Sulindac very much .  She is fasting today and needs labs. rec No change rx. Follow med calendar    11/30/2011 f/u ov/[name] cc   throat clearing "comes and goes" - no new complaints today. No excess mucus, just a sense of too much throat mucus some better with prn allegra. No sob/wheeze. rec GERD diet reviewed See calendar for specific medication instructions  If throat clearing persists ok to try pepcid  Ac 20 mg after bfast and at bedtime and call if not improving.   03/11/2012 f/u ov/[name] did not follow any of the instructions, lost calendar,  and now cc doe with dry cough and audible wheeze daily, no overt sinus or hb symptoms, no variability or pattern x much more day than night.  >rx prilosec/Pepcid   03/25/2012 Follow up and med review  Patient returns for a two-week followup visit and medication review We reviewed all her medications and organized her medication calendar with patient education. Last visit. Patient was having more difficulty with cough and wheezing.  Patient was treated for  Upper airway. Patient secondary to reflux with Prilosec and Pepcid. Patient has noticed improvement with decreased cough and wheezing. She feels that her cough is almost gone completely. Labs done last visit showed worsening renal insufficiency with serum creatinine up to 1.8. We reviewed this in detail today and have advised that she will stop her nonsteroidals and her spironolactone. She is agreed to come back for short-term followup in 2 weeks to have repeat labs. She denies any exertional chest pain, hemoptysis, orthopnea, PND, or increased leg swelling >>sulindac and aldactone stopped   04/06/2012 Follow up  Returns for labs check  Renal insufficiency w/ slow tr up of scr .  Last ov , sulindac and aldactone stopped  No increased edema. B/p controlled.  Lab: scr is 1.3  rec Remain off  Sulindac  And  Spironolactone.  Avoid NSIADS - motrin, advil , aleve    06/29/2012 f/u ov/[name] using med calendar cc   Pepcid and Delsym worked wonders for her breathing/cough. Pt states she has been experiencing bad HA since son psased away 6 weeks ago. Seems worse in am's not sleeping great, pain in gen s nausea or viz changes or weakness.   Sleeping ok without nocturnal  or early am exacerbation  of respiratory  c/o's or need for noct saba. Also denies any obvious fluctuation of symptoms with weather or environmental changes or other aggravating or alleviating factors except as outlined above  ROS  The following are not active complaints unless bolded sore throat, dysphagia, dental problems, itching, sneezing,  nasal congestion or excess/ purulent secretions, ear ache,   fever, chills, sweats, unintended wt loss, pleuritic or exertional cp, hemoptysis,  orthopnea pnd or leg swelling, presyncope, palpitations, heartburn, abdominal pain, anorexia, nausea, vomiting, diarrhea  or change in bowel or urinary habits, change in stools or urine, dysuria,hematuria,  rash, arthralgias,  visual complaints, headache, numbness weakness or ataxia or problems with walking or coordination,  change in mood/affect or memory.            Past Medical History:  Colon polyps, h/o - colonoscopy 04/08/2005  CHRONIC RHINITIS (ICD-472.0)  - tried immunotherapy 06/1999 MORBID OBESITY (ICD-278.01)  - Target wt = 163 for BMI < 30  DEGENERATIVE JOINT DISEASE (ICD-715.90).....................Marland KitchenMarland KitchenAlusio/ [facility]  HYPERTENSION (ICD-401.9)  CHRONIC RENAL INSUFFICIENCY  - Creat 1.5 February 12, 2010 , >1.3 July 02, 2010 (off NSAIDS)  HEALTH MAINTENANCE.........................................................[facility] [facility] - Pneumovax 11/04 age 60  - CPX 08/07/2011  - Med calendar lost 08/05/11 > referred back for new one>>>08/24/2011 , 03/25/2012  -colon 2011 -[facility]  GYN Care.................................................................................[facility]  - Bone density/mammography per gyn  -BMD 2011-nml  MEMORY LOSS  -MMSE July 01, 2010 >>30/30  -Clock Draw July 01, 2010 >>nml  -RPR -neg, B12/Folate nml    Past Surgical History:  Cataract durgery (rt eye) 2009  Hysterectomy 1980  Right TKR 2002  Left TKR 2003    Family History:  Positive for heart disease in remote members of her family  only. No breast or colon cancer in her family to her knowledge. Her  brother was a smoker had lung cancer.    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  News and Record previously Never smoker  No ETOH                  Objective:   Physical Exam  Somber amb bf nad wt 165 October 30, 2008 >>> 168 November 18, 2009 >169 08/24/2011 > 11/30/2011  166 > 03/11/2012  166 >167 03/25/2012 >>164 04/06/2012 > 06/29/2012  162   HEENT:nml  turbinates, and orophanx. Nl external ear canals without cough reflex  Neck without JVD/Nodes/TM  Lungs completely clear to A and P bilaterally without  cough on insp or exp maneuvers, no wheezing or pseudowheeze. RRR no s3 or murmur or increase in P2  Abd soft and benign with nl excursion in the supine position. No bruits or organomegaly  Ext warm without calf tenderness, cyanosis clubbing  Skin warm and dry without lesions  MS pulses sym and intact, equal strength, nml gait. ,  Mild djd  changes in fingers Neuro: nl gait, sensorium, no motor def or pathological reflexes.           Assessment & Plan:

## 2012-06-30 ENCOUNTER — Telehealth: Payer: Self-pay | Admitting: Internal Medicine

## 2012-06-30 DIAGNOSIS — R51 Headache: Secondary | ICD-10-CM | POA: Insufficient documentation

## 2012-06-30 DIAGNOSIS — R519 Headache, unspecified: Secondary | ICD-10-CM | POA: Insufficient documentation

## 2012-06-30 MED ORDER — HYDROCHLOROTHIAZIDE 25 MG PO TABS: 25.0000 mg | ORAL_TABLET | Freq: Every day | ORAL | Status: DC

## 2012-06-30 NOTE — Assessment & Plan Note (Signed)
Adequate control on present rx, reviewed  

## 2012-06-30 NOTE — Assessment & Plan Note (Signed)
-   Baseline creat 1.3-1.6 - 02/2012 scr 1.8 >04/06/2012 1.3 (off nsaids/aldactone)   Lab Results  Component Value Date   CREATININE 1.3* 04/06/2012   CREATININE 1.8* 03/11/2012   CREATININE 1.6* 08/24/2011    Adequate control on present rx, reviewed

## 2012-06-30 NOTE — Assessment & Plan Note (Signed)
Did not go go lab as requested to eval ? Need for fe

## 2012-06-30 NOTE — Assessment & Plan Note (Signed)
-   onset Mid July 2013 p son's death  The only unusual feature is worse in am's - offered rx with trazadone but declined, will try prn tramadol and call back if not better

## 2012-06-30 NOTE — Telephone Encounter (Signed)
Refill sent. Pt is aware. Legion Discher, CMA  

## 2012-07-04 ENCOUNTER — Other Ambulatory Visit (INDEPENDENT_AMBULATORY_CARE_PROVIDER_SITE_OTHER): Payer: Medicare PPO

## 2012-07-04 DIAGNOSIS — N189 Chronic kidney disease, unspecified: Secondary | ICD-10-CM

## 2012-07-04 DIAGNOSIS — D649 Anemia, unspecified: Secondary | ICD-10-CM

## 2012-07-04 LAB — BASIC METABOLIC PANEL
BUN: 16 mg/dL (ref 6–23)
CO2: 29 mEq/L (ref 19–32)
Calcium: 9.1 mg/dL (ref 8.4–10.5)
Chloride: 99 mEq/L (ref 96–112)
Creatinine, Ser: 1.2 mg/dL (ref 0.4–1.2)
GFR: 54.79 mL/min — ABNORMAL LOW (ref >60.00)
Glucose, Bld: 81 mg/dL (ref 70–99)
Potassium: 3.6 mEq/L (ref 3.5–5.1)
Sodium: 136 mEq/L (ref 135–145)

## 2012-07-04 LAB — CBC WITH DIFFERENTIAL/PLATELET
Basophils Absolute: 0 10*3/uL (ref 0.0–0.1)
Basophils Relative: 0.6 % (ref 0.0–3.0)
Eosinophils Absolute: 0.2 10*3/uL (ref 0.0–0.7)
Eosinophils Relative: 2.3 % (ref 0.0–5.0)
HCT: 33.7 % — ABNORMAL LOW (ref 36.0–46.0)
Hemoglobin: 11.2 g/dL — ABNORMAL LOW (ref 12.0–15.0)
Lymphocytes Relative: 28.6 % (ref 12.0–46.0)
Lymphs Abs: 2 10*3/uL (ref 0.7–4.0)
MCHC: 33.2 g/dL (ref 30.0–36.0)
MCV: 97.9 fl (ref 78.0–100.0)
Monocytes Absolute: 0.7 10*3/uL (ref 0.1–1.0)
Monocytes Relative: 10.3 % (ref 3.0–12.0)
Neutro Abs: 4.1 10*3/uL (ref 1.4–7.7)
Neutrophils Relative %: 58.2 % (ref 43.0–77.0)
Platelets: 243 10*3/uL (ref 150.0–400.0)
RBC: 3.44 Mil/uL — ABNORMAL LOW (ref 3.87–5.11)
RDW: 14.2 % (ref 11.5–14.6)
WBC: 7.1 10*3/uL (ref 4.5–10.5)

## 2012-07-04 LAB — IBC PANEL
Iron: 62 ug/dL (ref 42–145)
Saturation Ratios: 20.6 % (ref 20.0–50.0)
Transferrin: 214.6 mg/dL (ref 212.0–360.0)

## 2012-07-09 ENCOUNTER — Encounter: Payer: Self-pay | Admitting: Internal Medicine

## 2012-07-09 DIAGNOSIS — K5790 Diverticulosis of intestine, part unspecified, without perforation or abscess without bleeding: Secondary | ICD-10-CM | POA: Insufficient documentation

## 2012-07-11 NOTE — Progress Notes (Signed)
Quick Note:  Spoke with pt and notified of results per Dr. Wert. Pt verbalized understanding and denied any questions.  ______ 

## 2012-10-06 ENCOUNTER — Ambulatory Visit: Payer: Medicare PPO | Admitting: Internal Medicine

## 2012-10-13 ENCOUNTER — Encounter: Payer: Self-pay | Admitting: Internal Medicine

## 2012-10-13 ENCOUNTER — Other Ambulatory Visit (INDEPENDENT_AMBULATORY_CARE_PROVIDER_SITE_OTHER): Payer: Medicare PPO

## 2012-10-13 ENCOUNTER — Ambulatory Visit (INDEPENDENT_AMBULATORY_CARE_PROVIDER_SITE_OTHER): Payer: Medicare PPO | Admitting: Internal Medicine

## 2012-10-13 ENCOUNTER — Ambulatory Visit (INDEPENDENT_AMBULATORY_CARE_PROVIDER_SITE_OTHER)
Admission: RE | Admit: 2012-10-13 | Discharge: 2012-10-13 | Disposition: A | Discharge status: Home / Self Care | Payer: Medicare PPO | Source: Ambulatory Visit | Attending: Internal Medicine | Admitting: Internal Medicine

## 2012-10-13 VITALS — BP 140/80 | HR 82 | Temp 98.3°F | Ht 62.0 in | Wt 162.0 lb

## 2012-10-13 DIAGNOSIS — E559 Vitamin D deficiency, unspecified: Secondary | ICD-10-CM

## 2012-10-13 DIAGNOSIS — I10 Essential (primary) hypertension: Secondary | ICD-10-CM

## 2012-10-13 DIAGNOSIS — R609 Edema, unspecified: Secondary | ICD-10-CM

## 2012-10-13 DIAGNOSIS — Z23 Encounter for immunization: Secondary | ICD-10-CM

## 2012-10-13 DIAGNOSIS — R05 Cough: Secondary | ICD-10-CM

## 2012-10-13 DIAGNOSIS — D649 Anemia, unspecified: Secondary | ICD-10-CM

## 2012-10-13 DIAGNOSIS — N189 Chronic kidney disease, unspecified: Secondary | ICD-10-CM

## 2012-10-13 DIAGNOSIS — R059 Cough, unspecified: Secondary | ICD-10-CM

## 2012-10-13 LAB — CBC WITH DIFFERENTIAL/PLATELET
Basophils Absolute: 0 10*3/uL (ref 0.0–0.1)
Basophils Relative: 0.6 % (ref 0.0–3.0)
Eosinophils Absolute: 0.1 10*3/uL (ref 0.0–0.7)
Eosinophils Relative: 2.1 % (ref 0.0–5.0)
HCT: 36.2 % (ref 36.0–46.0)
Hemoglobin: 12 g/dL (ref 12.0–15.0)
Lymphocytes Relative: 27.9 % (ref 12.0–46.0)
Lymphs Abs: 1.9 10*3/uL (ref 0.7–4.0)
MCHC: 33.1 g/dL (ref 30.0–36.0)
MCV: 96.8 fl (ref 78.0–100.0)
Monocytes Absolute: 0.5 10*3/uL (ref 0.1–1.0)
Monocytes Relative: 8 % (ref 3.0–12.0)
Neutro Abs: 4.1 10*3/uL (ref 1.4–7.7)
Neutrophils Relative %: 61.4 % (ref 43.0–77.0)
Platelets: 270 10*3/uL (ref 150.0–400.0)
RBC: 3.74 Mil/uL — ABNORMAL LOW (ref 3.87–5.11)
RDW: 13 % (ref 11.5–14.6)
WBC: 6.7 10*3/uL (ref 4.5–10.5)

## 2012-10-13 LAB — HEPATIC FUNCTION PANEL
ALT: 26 U/L (ref 0–35)
AST: 29 U/L (ref 0–37)
Albumin: 3.9 g/dL (ref 3.5–5.2)
Alkaline Phosphatase: 62 U/L (ref 39–117)
Bilirubin, Direct: 0 mg/dL (ref 0.0–0.3)
Total Bilirubin: 0.6 mg/dL (ref 0.3–1.2)
Total Protein: 7.6 g/dL (ref 6.0–8.3)

## 2012-10-13 LAB — BASIC METABOLIC PANEL
BUN: 20 mg/dL (ref 6–23)
CO2: 29 mEq/L (ref 19–32)
Calcium: 9.7 mg/dL (ref 8.4–10.5)
Chloride: 101 mEq/L (ref 96–112)
Creatinine, Ser: 1.2 mg/dL (ref 0.4–1.2)
GFR: 56.88 mL/min — ABNORMAL LOW (ref >60.00)
Glucose, Bld: 100 mg/dL — ABNORMAL HIGH (ref 70–99)
Potassium: 3.6 mEq/L (ref 3.5–5.1)
Sodium: 140 mEq/L (ref 135–145)

## 2012-10-13 LAB — LIPID PANEL
Cholesterol: 180 mg/dL (ref 0–200)
HDL: 48.9 mg/dL (ref >39.00)
LDL Cholesterol: 112 mg/dL — ABNORMAL HIGH (ref 0–99)
Total CHOL/HDL Ratio: 4
Triglycerides: 94 mg/dL (ref 0.0–149.0)
VLDL: 18.8 mg/dL (ref 0.0–40.0)

## 2012-10-13 LAB — TSH: TSH: 2.25 u[IU]/mL (ref 0.35–5.50)

## 2012-10-13 NOTE — Patient Instructions (Addendum)
Please remember to go to the lab and x-ray department downstairs for your tests - we will call you with the results when they are available.     Please schedule a follow up visit in 3 months but call sooner if needed  

## 2012-10-13 NOTE — Progress Notes (Addendum)
Subjective:    Patient ID: [name], female    DOB: Sep 22, 1936    MRN: [id number]  HPI  22 yobf never smoker with morbid obesity complicated by hypertension and tendency to fluid retention controlled on spironolactone and hydrochlorothiazide daily.    November 18, 2009 ov cc doing well and denies any complaints . Needing tramadol and all her prns less using med calendar consistently.   05/18/2011 ov/[name] cc bp f/u no cc sob, swelling, multiple minor complaints but esp new x 48 R hip pain, minimally better with tylenol, thinks she "slept on it funny" no radiation to knee, no pain with cough - denies having med calendar or using it.   08/07/2011 f/u ov/[name] CPX forgot to fast  >>rx sulindac / needs new med calendar  08/24/2011 Follow up and Med review Pt returns for follow up and med review. We reviewed her medications and updated her medications w/ pt education. Her back and leg are feeling better. Did not use Sulindac very much .  She is fasting today and needs labs. rec No change rx. Follow med calendar    11/30/2011 f/u ov/[name] cc   throat clearing "comes and goes" - no new complaints today. No excess mucus, just a sense of too much throat mucus some better with prn allegra. No sob/wheeze. rec GERD diet reviewed See calendar for specific medication instructions  If throat clearing persists ok to try pepcid  Ac 20 mg after bfast and at bedtime and call if not improving.   03/11/2012 f/u ov/[name] did not follow any of the instructions, lost calendar,  and now cc doe with dry cough and audible wheeze daily, no overt sinus or hb symptoms, no variability or pattern x much more day than night.  >rx prilosec/Pepcid   03/25/2012 Follow up and med review  Patient returns for a two-week followup visit and medication review We reviewed all her medications and organized her medication calendar with patient education. Last visit. Patient was having more difficulty with cough and wheezing.  Patient was treated for  Upper airway. Patient secondary to reflux with Prilosec and Pepcid. Patient has noticed improvement with decreased cough and wheezing. She feels that her cough is almost gone completely. Labs done last visit showed worsening renal insufficiency with serum creatinine up to 1.8. We reviewed this in detail today and have advised that she will stop her nonsteroidals and her spironolactone. She is agreed to come back for short-term followup in 2 weeks to have repeat labs. She denies any exertional chest pain, hemoptysis, orthopnea, PND, or increased leg swelling >>sulindac and aldactone stopped   04/06/2012 Follow up  Returns for labs check  Renal insufficiency w/ slow tr up of scr .  Last ov , sulindac and aldactone stopped  No increased edema. B/p controlled.  Lab: scr is 1.3  rec Remain off  Sulindac  And  Spironolactone.  Avoid NSIADS - motrin, advil , aleve    06/29/2012 f/u ov/[name] using med calendar cc   Pepcid and Delsym worked wonders for her breathing/cough. Pt states she has been experiencing bad HA since son passed away 6 weeks ago. Seems worse in am's not sleeping great, pain in gen s nausea or viz changes or weakness.  rec Follow calendar    10/13/2012 f/u ov/[name] cc comprehensive eval of multiple chronic medical problems:obesity/ hbp/GERD/ CRI /djd no new complaints.  No cp, tia or claudication symptoms.  Sleeping ok without nocturnal  or early am exacerbation  of respiratory  c/o's or need for noct saba. Also denies any obvious fluctuation of symptoms with weather or environmental changes or other aggravating or alleviating factors except as outlined above   ROS  The following are not active complaints unless bolded sore throat, dysphagia, dental problems, itching, sneezing,  nasal congestion or excess/ purulent secretions, ear ache,   fever, chills, sweats, unintended wt loss, pleuritic or exertional cp, hemoptysis,  orthopnea pnd or leg swelling,  presyncope, palpitations, heartburn, abdominal pain, anorexia, nausea, vomiting, diarrhea  or change in bowel or urinary habits, change in stools or urine, dysuria,hematuria,  rash, arthralgias, visual complaints, headache, numbness weakness or ataxia or problems with walking or coordination,  change in mood/affect or memory.            Past Medical History:  Colon polyps, h/o - colonoscopy 04/08/2005  CHRONIC RHINITIS (ICD-472.0)  - tried immunotherapy 06/1999 MORBID OBESITY (ICD-278.01)  - Target wt = 163 for BMI < 30  DEGENERATIVE JOINT DISEASE (ICD-715.90).....................Marland KitchenMarland KitchenAlusio/ [place] [facility]  HYPERTENSION (ICD-401.9)  CHRONIC RENAL INSUFFICIENCY  - Creat 1.5 February 12, 2010 , >1.3 July 02, 2010 (off NSAIDS)  [place] MAINTENANCE....................................................[place] - Pneumovax 08/2003 age 89  - CPX  10/13/2012  - Med calendar lost 08/05/11 > referred back for new one>>>08/24/2011 , 03/25/2012  -colon 2011 -[facility]  GYN Care.................................................................................[facility]  - Bone density/mammography per gyn  -BMD 2011-nml  MEMORY LOSS  -MMSE July 01, 2010 >>30/30  -Clock Draw July 01, 2010 >>nml  -RPR -neg, B12/Folate nml    Past Surgical History:  Cataract durgery (rt eye) 2009  Hysterectomy 1980  Right TKR 2002  Left TKR 2003    Family History:  Positive for heart disease in remote members of her family  only. No breast or colon cancer in her family to her knowledge. Her  brother was a smoker had lung cancer.    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  News and Record previously Never smoker  No ETOH                  Objective:   Physical Exam  Somber amb bf nad wt 165 October 30, 2008 >>> 168 November 18, 2009 >169 08/24/2011 > 11/30/2011  166 > 03/11/2012  166 >167 03/25/2012 >>164  04/06/2012 > 06/29/2012  162 > 162  10/13/2012    HEENT:nml  turbinates, and orophanx. Nl external ear canals without cough reflex  Neck without JVD/Nodes/TM  Lungs completely clear to A and P bilaterally without cough on insp or exp maneuvers, no wheezing or pseudowheeze. RRR no s3 or murmur or increase in P2  Abd soft and benign with nl excursion in the supine position. No bruits or organomegaly  Ext warm without calf tenderness, cyanosis clubbing  Skin warm and dry without lesions  MS pulses sym and intact, equal strength, nml gait. ,  Mild djd  changes in fingers Neuro: nl gait, sensorium, no motor def or pathological reflexes.    CXR  10/13/2012 :  No active disease.          Assessment & Plan:

## 2012-10-14 NOTE — Progress Notes (Signed)
Quick Note:  Spoke with pt and notified of results per Dr. Wert. Pt verbalized understanding and denied any questions.  ______ 

## 2012-10-15 NOTE — Assessment & Plan Note (Signed)
resolved 

## 2012-10-15 NOTE — Assessment & Plan Note (Signed)
-   Baseline creat 1.3-1.6 - 02/2012 scr 1.8 >04/06/2012 1.3 (off nsaids/aldactone)   Lab Results  Component Value Date   CREATININE 1.2 10/13/2012   CREATININE 1.2 07/04/2012   CREATININE 1.3* 04/06/2012   Adequate control on present rx, reviewed

## 2012-10-15 NOTE — Assessment & Plan Note (Signed)
Attributed to pnds, controlled with otc's

## 2012-10-15 NOTE — Assessment & Plan Note (Signed)
Adequate control on present rx, reviewed  

## 2012-10-15 NOTE — Assessment & Plan Note (Signed)
Adequate control on present rx, reviewed labs

## 2012-10-16 LAB — VITAMIN D 1,25 DIHYDROXY
Vitamin D 1, 25 (OH)2 Total: 50 pg/mL (ref 18–72)
Vitamin D2 1, 25 (OH)2: <8 pg/mL
Vitamin D3 1, 25 (OH)2: 50 pg/mL

## 2012-10-17 NOTE — Progress Notes (Signed)
Quick Note:  Spoke with pt and notified of results per Dr. Wert. Pt verbalized understanding and denied any questions.  ______ 

## 2012-10-28 ENCOUNTER — Telehealth: Payer: Self-pay | Admitting: Internal Medicine

## 2012-10-28 NOTE — Telephone Encounter (Signed)
I do not have this form Called and spoke with [facility] and she states will refax it to the triage faxline Will await fax

## 2012-10-28 NOTE — Telephone Encounter (Signed)
Called, spoke with [name].  Calling to check on the status of order that was faxed over yesterday for a back brace.  Advised will forward to Les to see if they did receive.  If not, she will call back to have them refax.  Otherwise, Dr. [name] is off until next week.  [name] verbalized understanding and voiced no further questions or concerns at this time.

## 2012-10-28 NOTE — Telephone Encounter (Signed)
Fax received and placed in [place] Please advise once signed, thanks!

## 2012-10-29 NOTE — Telephone Encounter (Signed)
I do not see any documentation in my records re need or use of back brace so she'll need to contact whoever ordered it in the first place - she has an orthopedic doctor she's seen in the past but I don't see the name in EMR

## 2012-10-31 NOTE — Telephone Encounter (Signed)
I have faxed the form back with MW's recs

## 2012-11-01 ENCOUNTER — Encounter: Payer: Self-pay | Admitting: Internal Medicine

## 2012-11-01 ENCOUNTER — Telehealth: Payer: Self-pay | Admitting: Internal Medicine

## 2012-11-01 NOTE — Telephone Encounter (Signed)
Felicia Gully, MD 10/29/2012 7:27 AM Signed  I do not see any documentation in my records re need or use of back brace so she'll need to contact whoever ordered it in the first place - she has an orthopedic doctor she's seen in the past but I don't see the name in EMR  I called and advised Brie of the above.Animas Bing, CMA

## 2012-12-12 ENCOUNTER — Other Ambulatory Visit: Payer: Self-pay | Admitting: Internal Medicine

## 2013-04-05 ENCOUNTER — Telehealth: Payer: Self-pay | Admitting: Internal Medicine

## 2013-04-05 NOTE — Telephone Encounter (Signed)
i spoke with pt.  She stated she will discuss this with MW at her next OV next week.

## 2013-04-05 NOTE — Telephone Encounter (Signed)
She has it - that's why we told her to avoid nsaids.   It has improved but still not normal  and I can provide her company with her labs to show its better but I can't change the records (against the law) .    If she wants referral to a kidney specialist that's fine with me or I can go over the numbers in more detail at next ov

## 2013-04-05 NOTE — Telephone Encounter (Signed)
I spoke with pt and she stated she had to apply for life insurance. She stated on there it mentions PMH kidney failure. Pt is wanting D.r [name] to write a letter for her stating she does not have a kidney problem. Please advise Dr. [name] thanks

## 2013-04-11 ENCOUNTER — Ambulatory Visit: Payer: Medicare PPO | Admitting: Internal Medicine

## 2013-04-13 ENCOUNTER — Other Ambulatory Visit: Payer: Self-pay | Admitting: Internal Medicine

## 2013-04-13 ENCOUNTER — Telehealth: Payer: Self-pay | Admitting: Internal Medicine

## 2013-04-13 NOTE — Telephone Encounter (Signed)
ATC PT NA phone rang several times and no one picked up and no VM WCB

## 2013-04-13 NOTE — Telephone Encounter (Signed)
Per phone msg from 04/05/13:  Tanda Rockers, MD at 04/05/2013 4:21 PM   Status: Signed            She has it - that's why we told her to avoid nsaids.  It has improved but still not normal and I can provide her company with her labs to show its better but I can't change the records (against the law) .  If she wants referral to a kidney specialist that's fine with me or I can go over the numbers in more detail at next ov        [place] at 04/05/2013 3:12 PM    Status: Signed             I spoke with pt and she stated she had to apply for life insurance. She stated on there it mentions PMH kidney failure. Pt is wanting D.r [name] to write a letter for her stating she does not have a kidney problem. Please advise Dr. [name] thanks   --------  Called, spoke with pt.  She wanted to know what Dr. [name] said in the previous msg.  Informed her of above.  She verbalized understanding and will discuss this further with MW during July 2 OV.  She voiced no further questions or concerns at this time and will call back if anything further is needed.

## 2013-04-26 ENCOUNTER — Ambulatory Visit (INDEPENDENT_AMBULATORY_CARE_PROVIDER_SITE_OTHER): Payer: Medicare PPO | Admitting: Internal Medicine

## 2013-04-26 ENCOUNTER — Other Ambulatory Visit (INDEPENDENT_AMBULATORY_CARE_PROVIDER_SITE_OTHER): Payer: Medicare PPO

## 2013-04-26 ENCOUNTER — Encounter: Payer: Self-pay | Admitting: Internal Medicine

## 2013-04-26 VITALS — BP 140/72 | HR 63 | Temp 98.4°F | Ht 63.0 in | Wt 159.6 lb

## 2013-04-26 DIAGNOSIS — D649 Anemia, unspecified: Secondary | ICD-10-CM

## 2013-04-26 DIAGNOSIS — M1990 Unspecified osteoarthritis, unspecified site: Secondary | ICD-10-CM

## 2013-04-26 DIAGNOSIS — E559 Vitamin D deficiency, unspecified: Secondary | ICD-10-CM

## 2013-04-26 DIAGNOSIS — I10 Essential (primary) hypertension: Secondary | ICD-10-CM

## 2013-04-26 DIAGNOSIS — K219 Gastro-esophageal reflux disease without esophagitis: Secondary | ICD-10-CM

## 2013-04-26 DIAGNOSIS — N182 Chronic kidney disease, stage 2 (mild): Secondary | ICD-10-CM

## 2013-04-26 LAB — BASIC METABOLIC PANEL
BUN: 19 mg/dL (ref 6–23)
CO2: 31 mEq/L (ref 19–32)
Calcium: 9.4 mg/dL (ref 8.4–10.5)
Chloride: 98 mEq/L (ref 96–112)
Creatinine, Ser: 1.2 mg/dL (ref 0.4–1.2)
GFR: 54.67 mL/min — ABNORMAL LOW (ref >60.00)
Glucose, Bld: 107 mg/dL — ABNORMAL HIGH (ref 70–99)
Potassium: 3.5 mEq/L (ref 3.5–5.1)
Sodium: 135 mEq/L (ref 135–145)

## 2013-04-26 LAB — CBC WITH DIFFERENTIAL/PLATELET
Basophils Absolute: 0 10*3/uL (ref 0.0–0.1)
Basophils Relative: 0.9 % (ref 0.0–3.0)
Eosinophils Absolute: 0.1 10*3/uL (ref 0.0–0.7)
Eosinophils Relative: 2.5 % (ref 0.0–5.0)
HCT: 32.4 % — ABNORMAL LOW (ref 36.0–46.0)
Hemoglobin: 10.9 g/dL — ABNORMAL LOW (ref 12.0–15.0)
Lymphocytes Relative: 35.4 % (ref 12.0–46.0)
Lymphs Abs: 2 10*3/uL (ref 0.7–4.0)
MCHC: 33.7 g/dL (ref 30.0–36.0)
MCV: 94.8 fl (ref 78.0–100.0)
Monocytes Absolute: 0.5 10*3/uL (ref 0.1–1.0)
Monocytes Relative: 9.2 % (ref 3.0–12.0)
Neutro Abs: 2.9 10*3/uL (ref 1.4–7.7)
Neutrophils Relative %: 52 % (ref 43.0–77.0)
Platelets: 226 10*3/uL (ref 150.0–400.0)
RBC: 3.42 Mil/uL — ABNORMAL LOW (ref 3.87–5.11)
RDW: 13.9 % (ref 11.5–14.6)
WBC: 5.7 10*3/uL (ref 4.5–10.5)

## 2013-04-26 NOTE — Progress Notes (Signed)
Quick Note:  Spoke with pt and notified of results per Dr. Wert. Pt verbalized understanding and denied any questions.  ______ 

## 2013-04-26 NOTE — Progress Notes (Signed)
Subjective:    Patient ID: [name], female    DOB: 28-Jan-1936    MRN: [id number]    Brief patient profile:  34 yobf never smoker with morbid obesity complicated by hypertension and tendency to fluid retention controlled on spironolactone and hydrochlorothiazide daily.     11/30/2011 f/u ov/[name] cc   throat clearing "comes and goes" - no new complaints today. No excess mucus, just a sense of too much throat mucus some better with prn allegra. No sob/wheeze. rec GERD diet reviewed See calendar for specific medication instructions  If throat clearing persists ok to try pepcid  Ac 20 mg after bfast and at bedtime and call if not improving.   03/11/2012 f/u ov/[name] did not follow any of the instructions, lost calendar,  and now cc doe with dry cough and audible wheeze daily, no overt sinus or hb symptoms, no variability or pattern x much more day than night.  >rx prilosec/Pepcid   03/25/2012  NP ov. Last visit. Patient was having more difficulty with cough and wheezing. Patient was treated for  Upper airway. Patient secondary to reflux with Prilosec and Pepcid. Patient has noticed improvement with decreased cough and wheezing. She feels that her cough is almost gone completely. Labs done last visit showed worsening renal insufficiency with serum creatinine up to 1.8. We reviewed this in detail today and have advised that she will stop her nonsteroidals and her spironolactone. She is agreed to come back for short-term followup in 2 weeks to have repeat labs. She denies any exertional chest pain, hemoptysis, orthopnea, PND, or increased leg swelling >>sulindac and aldactone stopped   04/06/2012 NP ov Returns for labs check  Renal insufficiency w/ slow tr up of scr .  Last ov , sulindac and aldactone stopped  No increased edema. B/p controlled.  Lab: scr is 1.3  rec Remain off  Sulindac  And  Spironolactone.  Avoid NSIADS - motrin, advil , aleve    06/29/2012 f/u ov/[name] using  med calendar cc   Pepcid and Delsym worked wonders for her breathing/cough. Pt states she has been experiencing bad HA since son passed away 6 weeks ago. Seems worse in am's not sleeping great, pain in gen s nausea or viz changes or weakness.  rec Follow calendar    10/13/2012 cpx > no change rx, follow med calendar   04/26/2013 f/u ov/[name] f/u ov/[name] multiple chronic medical problems:obesity/ hbp/GERD/ CRI /djd no new complaints.  No cp, tia or claudication symptoms. Using tramadol 50 mg avg twice daily  No correlation between what she's really taking and her med calendar.  Sleeping ok without nocturnal  or early am exacerbation  of respiratory  c/o's or need for noct saba. Also denies any obvious fluctuation of symptoms with weather or environmental changes or other aggravating or alleviating factors except as outlined above   ROS  The following are not active complaints unless bolded sore throat, dysphagia, dental problems, itching, sneezing,  nasal congestion or excess/ purulent secretions, ear ache,   fever, chills, sweats, unintended wt loss, pleuritic or exertional cp, hemoptysis,  orthopnea pnd or leg swelling, presyncope, palpitations, heartburn, abdominal pain, anorexia, nausea, vomiting, diarrhea  or change in bowel or urinary habits, change in stools or urine, dysuria,hematuria,  rash, arthralgias, visual complaints, headache, numbness weakness or ataxia or problems with walking or coordination,  change in mood/affect or memory.            Past Medical History:  Colon polyps, h/o -  colonoscopy 04/08/2005  CHRONIC RHINITIS (ICD-472.0)  - tried immunotherapy 06/1999 MORBID OBESITY (ICD-278.01)  - Target wt = 163 for BMI < 30  DEGENERATIVE JOINT DISEASE (ICD-715.90).....................Marland KitchenMarland KitchenAlusio/ [place] [facility]  HYPERTENSION (ICD-401.9)  CHRONIC RENAL INSUFFICIENCY  - Creat 1.5 February 12, 2010 , >1.3 July 02, 2010 (off NSAIDS)  HEALTH  MAINTENANCE....................................................[place] - Pneumovax 08/2003 age 36  - CPX  10/13/2012  - Med calendar lost 08/05/11 > referred back for new one>>>08/24/2011 , 03/25/2012  -colon 2011 -[facility]  GYN Care.................................................................................[facility]  - Bone density/mammography per gyn  -BMD 2011-nml  MEMORY LOSS  -MMSE July 01, 2010 >>30/30  -Clock Draw July 01, 2010 >>nml  -RPR -neg, B12/Folate nml    Past Surgical History:  Cataract durgery (rt eye) 2009  Hysterectomy 1980  Right TKR 2002  Left TKR 2003    Family History:  Positive for heart disease in remote members of her family  only. No breast or colon cancer in her family to her knowledge. Her  brother was a smoker had lung cancer.    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  News and Record previously Never smoker  No ETOH                  Objective:   Physical Exam  Somber amb bf nad wt 165 October 30, 2008 >>> 168 November 18, 2009 >169 08/24/2011 > 11/30/2011  166 > 03/11/2012  166 >167 03/25/2012 >>164 04/06/2012 > 06/29/2012  162 > 162  10/13/2012 > 04/26/2013  160    HEENT:nml  turbinates, and orophanx. Nl external ear canals without cough reflex  Neck without JVD/Nodes/TM  Lungs completely clear to A and P bilaterally without cough on insp or exp maneuvers, no wheezing or pseudowheeze. RRR no s3 or murmur or increase in P2  Abd soft and benign with nl excursion in the supine position. No bruits or organomegaly  Ext warm without calf tenderness, cyanosis clubbing  Skin warm and dry without lesions  MS pulses sym and intact, equal strength, nml gait. ,  Mild djd  changes in fingers Neuro: nl gait, sensorium, no motor def or pathological reflexes.    CXR  10/13/2012 :  No active disease.          Assessment & Plan:

## 2013-04-26 NOTE — Patient Instructions (Addendum)
Restart the chondroiton daily as per med calendar  See calendar for specific medication instructions and bring it back for each and every office visit for every healthcare provider you see.  Without it,  you may not receive the best quality medical care that we feel you deserve.  You will note that the calendar groups together  your maintenance  medications that are timed at particular times of the day.  Think of this as your checklist for what your doctor has instructed you to do until your next evaluation to see what benefit  there is  to staying on a consistent group of medications intended to keep you well.  The other group at the bottom is entirely up to you to use as you see fit  for specific symptoms that may arise between visits that require you to treat them on an as needed basis.  Think of this as your action plan or "what if" list.   Separating the top medications from the bottom group is fundamental to providing you adequate care going forward.    Please remember to go to the lab  department downstairs for your tests - we will call you with the results when they are available.  See Tammy NP w/in 3 months with all your medications, even over the counter meds, separated in two separate bags, the ones you take no matter what vs the ones you stop once you feel better and take only as needed when you feel you need them.   Tammy  will generate for you a new user friendly medication calendar that will put [place] all on the same page re: your medication use.     Without this process, it simply isn't possible to assure that we are providing  your outpatient care  with  the attention to detail we feel you deserve.   If we cannot assure that you're getting that kind of care,  then we cannot manage your problem effectively from this clinic.  Once you have seen Tammy and we are sure that we're all on the same page with your medication use she will arrange follow up with me for comprehensive evaluation in  dec 2014

## 2013-04-26 NOTE — Assessment & Plan Note (Signed)
Adequate control on present rx, reviewed  

## 2013-04-27 LAB — VITAMIN D 25 HYDROXY (VIT D DEFICIENCY, FRACTURES): Vit D, 25-Hydroxy: 67 ng/mL (ref 30–89)

## 2013-04-27 NOTE — Progress Notes (Signed)
Quick Note:  Pt aware ______ 

## 2013-04-30 NOTE — Assessment & Plan Note (Signed)
-   Baseline creat 1.3-1.6 - 02/2012 scr 1.8 >04/06/2012 1.3 (off nsaids/aldactone)   Lab Results  Component Value Date   CREATININE 1.2 04/26/2013   CREATININE 1.2 10/13/2012   CREATININE 1.2 07/04/2012     Stable baseline, mild CRI likely related to hbp

## 2013-04-30 NOTE — Assessment & Plan Note (Signed)
Adequate control on present rx, levels reviewed > no change rx

## 2013-04-30 NOTE — Assessment & Plan Note (Signed)
rec add back chondroiton sulfate, avoid nsaids due to CRI   Struggling with concept of med reconciliation.  To keep things simple, I have asked the patient to first separate medicines that are perceived as maintenance, that is to be taken daily "no matter what", from those medicines that are taken on only on an as-needed basis and I have given the patient examples of both, and then return to see our NP to generate a  detailed  medication calendar which should be followed until the next physician sees the patient and updates it.

## 2013-04-30 NOTE — Assessment & Plan Note (Signed)
  Recent Labs Lab 04/26/13 1104  HGB 10.9*    Likely result of CRI/ chronic dz/ follow for now

## 2013-04-30 NOTE — Assessment & Plan Note (Signed)
Extra esophageal, controlled on present rx which has eliminated her chronic cough

## 2013-05-17 ENCOUNTER — Telehealth: Payer: Self-pay | Admitting: Internal Medicine

## 2013-05-17 MED ORDER — HYDROCHLOROTHIAZIDE 25 MG PO TABS: 25.0000 mg | ORAL_TABLET | Freq: Every day | ORAL | Status: DC

## 2013-05-17 NOTE — Telephone Encounter (Signed)
Rx has been sent in. Pt is aware. 

## 2013-06-14 ENCOUNTER — Telehealth: Payer: Self-pay | Admitting: Internal Medicine

## 2013-06-14 MED ORDER — TRAMADOL HCL 50 MG PO TABS: ORAL_TABLET | ORAL | Status: DC

## 2013-06-14 NOTE — Telephone Encounter (Signed)
Pt tramadol last refilled 04/13/13 #120 x 0 refills. She wants this to go to mail order.  Last ov 04/26/13 07/28/13 pending w/ TP Please advise MW thanks

## 2013-06-14 NOTE — Telephone Encounter (Signed)
RX printed and will have MW sign and fax over to [facility]. Pt aware will do so and nothing further needed

## 2013-06-14 NOTE — Telephone Encounter (Signed)
Ok to refill 

## 2013-06-28 ENCOUNTER — Telehealth: Payer: Self-pay | Admitting: Internal Medicine

## 2013-06-28 NOTE — Telephone Encounter (Signed)
Pt wanted to reclarify that the nurse told her that dr wert could write the letter for her.[name]

## 2013-06-28 NOTE — Telephone Encounter (Signed)
I spoke with [name]. She stated when she was here 04/26/13 Dr. [name] advised her he would write a letter for her stating she "had" a kidney problem and no longer does now. Please advise MW thanks

## 2013-06-28 NOTE — Telephone Encounter (Signed)
I spoke with pt and made her aware. Nothing further needed 

## 2013-06-28 NOTE — Telephone Encounter (Signed)
She has a very mild renal condition related to her hbp we previously discussed > we can send the records to whomever she choses to review but I can't write a note that says her kidneys are perfectly nl

## 2013-07-28 ENCOUNTER — Encounter: Payer: Medicare PPO | Admitting: Adult Health

## 2013-08-22 ENCOUNTER — Telehealth: Payer: Self-pay | Admitting: Internal Medicine

## 2013-08-22 NOTE — Telephone Encounter (Signed)
ATC pt line rang several times and no answer. No VM either WCB

## 2013-08-23 MED ORDER — TRAMADOL HCL 50 MG PO TABS: ORAL_TABLET | ORAL | Status: DC

## 2013-08-23 NOTE — Telephone Encounter (Signed)
Pt is aware and RX printed out and placed in MW look at for [place] to fax. Nothing further needed

## 2013-08-23 NOTE — Telephone Encounter (Signed)
Pt is requesting a refill on tramadol 1 tablet every 8 hours as needed. Last rx written for #120 on 06-14-13. Pt wants rx sent to [facility]. Please advise if ok to refill. [place] Bing, CMA

## 2013-08-23 NOTE — Telephone Encounter (Signed)
As long as she's keeping f/u ov's that's fine

## 2013-08-28 ENCOUNTER — Ambulatory Visit (INDEPENDENT_AMBULATORY_CARE_PROVIDER_SITE_OTHER): Payer: Medicare PPO

## 2013-08-28 DIAGNOSIS — Z23 Encounter for immunization: Secondary | ICD-10-CM

## 2013-08-31 ENCOUNTER — Ambulatory Visit: Payer: Medicare PPO | Admitting: Adult Health

## 2013-11-06 ENCOUNTER — Other Ambulatory Visit: Payer: Self-pay | Admitting: Radiology

## 2013-11-08 ENCOUNTER — Telehealth: Payer: Self-pay | Admitting: *Deleted

## 2013-11-08 DIAGNOSIS — C50412 Malignant neoplasm of upper-outer quadrant of left female breast: Secondary | ICD-10-CM

## 2013-11-08 DIAGNOSIS — Z170 Estrogen receptor positive status [ER+]: Secondary | ICD-10-CM | POA: Insufficient documentation

## 2013-11-08 DIAGNOSIS — C50419 Malignant neoplasm of upper-outer quadrant of unspecified female breast: Secondary | ICD-10-CM | POA: Insufficient documentation

## 2013-11-08 HISTORY — DX: Malignant neoplasm of upper-outer quadrant of left female breast: C50.412

## 2013-11-08 NOTE — Telephone Encounter (Signed)
Confirmed [facility] for 11/15/13 at 0830.  Instructions and contact information given.

## 2013-11-15 ENCOUNTER — Telehealth: Payer: Self-pay | Admitting: Oncology

## 2013-11-15 ENCOUNTER — Ambulatory Visit: Payer: Medicare PPO | Attending: Surgery | Admitting: Physical Therapy

## 2013-11-15 ENCOUNTER — Encounter: Payer: Self-pay | Admitting: *Deleted

## 2013-11-15 ENCOUNTER — Ambulatory Visit (HOSPITAL_BASED_OUTPATIENT_CLINIC_OR_DEPARTMENT_OTHER): Payer: Medicare PPO | Admitting: Surgery

## 2013-11-15 ENCOUNTER — Ambulatory Visit (HOSPITAL_BASED_OUTPATIENT_CLINIC_OR_DEPARTMENT_OTHER): Payer: Medicare PPO | Admitting: Oncology

## 2013-11-15 ENCOUNTER — Other Ambulatory Visit (INDEPENDENT_AMBULATORY_CARE_PROVIDER_SITE_OTHER): Payer: Self-pay | Admitting: Surgery

## 2013-11-15 ENCOUNTER — Other Ambulatory Visit: Payer: Medicare PPO

## 2013-11-15 ENCOUNTER — Other Ambulatory Visit (HOSPITAL_BASED_OUTPATIENT_CLINIC_OR_DEPARTMENT_OTHER): Payer: Medicare PPO

## 2013-11-15 ENCOUNTER — Ambulatory Visit (HOSPITAL_BASED_OUTPATIENT_CLINIC_OR_DEPARTMENT_OTHER): Payer: Commercial Managed Care - HMO

## 2013-11-15 ENCOUNTER — Ambulatory Visit
Admission: RE | Admit: 2013-11-15 | Discharge: 2013-11-15 | Disposition: A | Discharge status: Home / Self Care | Payer: Medicare PPO | Source: Ambulatory Visit | Attending: Radiation Oncology | Admitting: Radiation Oncology

## 2013-11-15 ENCOUNTER — Encounter: Payer: Self-pay | Admitting: Oncology

## 2013-11-15 VITALS — BP 161/65 | HR 73 | Temp 98.0°F | Resp 20 | Ht 61.75 in | Wt 155.6 lb

## 2013-11-15 DIAGNOSIS — C50419 Malignant neoplasm of upper-outer quadrant of unspecified female breast: Secondary | ICD-10-CM

## 2013-11-15 DIAGNOSIS — C50412 Malignant neoplasm of upper-outer quadrant of left female breast: Secondary | ICD-10-CM

## 2013-11-15 DIAGNOSIS — Z96659 Presence of unspecified artificial knee joint: Secondary | ICD-10-CM | POA: Insufficient documentation

## 2013-11-15 DIAGNOSIS — I10 Essential (primary) hypertension: Secondary | ICD-10-CM | POA: Insufficient documentation

## 2013-11-15 DIAGNOSIS — Z170 Estrogen receptor positive status [ER+]: Secondary | ICD-10-CM

## 2013-11-15 DIAGNOSIS — C50919 Malignant neoplasm of unspecified site of unspecified female breast: Secondary | ICD-10-CM | POA: Insufficient documentation

## 2013-11-15 DIAGNOSIS — IMO0001 Reserved for inherently not codable concepts without codable children: Secondary | ICD-10-CM | POA: Insufficient documentation

## 2013-11-15 DIAGNOSIS — R293 Abnormal posture: Secondary | ICD-10-CM | POA: Insufficient documentation

## 2013-11-15 LAB — CBC WITH DIFFERENTIAL/PLATELET
BASO%: 0.6 % (ref 0.0–2.0)
Basophils Absolute: 0 10*3/uL (ref 0.0–0.1)
EOS%: 5.9 % (ref 0.0–7.0)
Eosinophils Absolute: 0.3 10*3/uL (ref 0.0–0.5)
HCT: 34.3 % — ABNORMAL LOW (ref 34.8–46.6)
HGB: 11.1 g/dL — ABNORMAL LOW (ref 11.6–15.9)
LYMPH%: 32.4 % (ref 14.0–49.7)
MCH: 30.8 pg (ref 25.1–34.0)
MCHC: 32.4 g/dL (ref 31.5–36.0)
MCV: 95.3 fL (ref 79.5–101.0)
MONO#: 0.5 10*3/uL (ref 0.1–0.9)
MONO%: 9.3 % (ref 0.0–14.0)
NEUT#: 2.7 10*3/uL (ref 1.5–6.5)
NEUT%: 51.8 % (ref 38.4–76.8)
Platelets: 221 10*3/uL (ref 145–400)
RBC: 3.6 10*6/uL — ABNORMAL LOW (ref 3.70–5.45)
RDW: 12.9 % (ref 11.2–14.5)
WBC: 5.3 10*3/uL (ref 3.9–10.3)
lymph#: 1.7 10*3/uL (ref 0.9–3.3)

## 2013-11-15 LAB — COMPREHENSIVE METABOLIC PANEL (CC13)
ALT: 24 U/L (ref 0–55)
AST: 24 U/L (ref 5–34)
Albumin: 3.6 g/dL (ref 3.5–5.0)
Alkaline Phosphatase: 61 U/L (ref 40–150)
Anion Gap: 10 mEq/L (ref 3–11)
BUN: 23 mg/dL (ref 7.0–26.0)
CO2: 29 mEq/L (ref 22–29)
Calcium: 9.6 mg/dL (ref 8.4–10.4)
Chloride: 101 mEq/L (ref 98–109)
Creatinine: 1.2 mg/dL — ABNORMAL HIGH (ref 0.6–1.1)
Glucose: 107 mg/dl (ref 70–140)
Potassium: 3.5 mEq/L (ref 3.5–5.1)
Sodium: 140 mEq/L (ref 136–145)
Total Bilirubin: 0.43 mg/dL (ref 0.20–1.20)
Total Protein: 7.2 g/dL (ref 6.4–8.3)

## 2013-11-15 NOTE — Progress Notes (Signed)
Radiation Oncology         [phone]) [phone] ________________________________  Initial outpatient Consultation - Date: 11/15/2013   Name: [name] MRN: [id number]   DOB: 07/01/1936  REFERRING PHYSICIAN: Shann Medal, MD  DIAGNOSIS: T1bN0 Invasive Ductal Carcinoma  HISTORY OF PRESENT ILLNESS::[name] is a 78 y.o. female  who underwent a screening mammogram. A left breast mass was noted in the upper outer quadrant. This measured 6 mm. This was 8 solitary finding. No abnormal lymph nodes are seen. She then had a biopsy which showed a grade 2 invasive ductal carcinoma which was ER/PR positive HER-2 negative and the Ki-67 was 90%. She's done well since her biopsy. She had no complaints of pain or palpable masses prior to her biopsy. She is accompanied by her 2 daughters today. She is GX P2 with her first live birth at 73. Her last period was 34 years ago. She has not been on hormone replacement therapy.[name]  PREVIOUS RADIATION THERAPY: No  PAST MEDICAL HISTORY:  has a past medical history of Colon polyp; Chronic rhinitis; Morbid obesity; DJD (degenerative joint disease); Hypertension; Chronic renal insufficiency; Memory loss; and Anemia.    PAST SURGICAL HISTORY: Past Surgical History  Procedure Laterality Date  . Cataract extraction  2009    rt  . Vesicovaginal fistula closure w/ tah  1980  . Total knee arthroplasty  2002    rt  . Total knee arthroplasty  2003    left  . Abdominal hysterectomy      FAMILY HISTORY:  Family History  Problem Relation Age of Onset  . Heart disease      remote family  . Lung cancer Brother     was a smoker  . Colon cancer Mother   . Colon cancer Maternal Aunt     SOCIAL HISTORY:  History  Substance Use Topics  . Smoking status: Never Smoker   . Smokeless tobacco: Not on file  . Alcohol Use: No    ALLERGIES: Iron  MEDICATIONS:  Current Outpatient Prescriptions  Medication Sig Dispense Refill  . Acetaminophen (TYLENOL  ARTHRITIS PAIN PO) Take by mouth. Take as directed       . aspirin 81 MG tablet Take 81 mg by mouth daily.        . calcium-vitamin D (OSCAL) 250-125 MG-UNIT per tablet Take 2 tablets by mouth daily.      . Cholecalciferol (VITAMIN D3) 2000 UNITS TABS Take 1 tablet by mouth daily.       [name] dextromethorphan (DELSYM) 30 MG/5ML liquid Take 2 tsp twice daily as needed       . famotidine (PEPCID) 20 MG tablet One at bedtime  30 tablet  11  . ferrous sulfate 325 (65 FE) MG EC tablet Take 325 mg by mouth daily with breakfast.        . fexofenadine (ALLEGRA) 180 MG tablet Take 180 mg by mouth daily as needed.        [name] glucosamine-chondroitin 500-400 MG tablet Take 1 tablet by mouth daily.       . hydrochlorothiazide (HYDRODIURIL) 25 MG tablet Take 1 tablet (25 mg total) by mouth daily.  90 tablet  3  . Multiple Vitamins-Minerals (CENTRUM SILVER PO) Take 1 tablet by mouth daily.        [name] omeprazole (PRILOSEC) 20 MG capsule Take 1 capsule (20 mg total) by mouth daily.  30 capsule  1  . Simethicone (GAS-X PO) Take by mouth. Take as directed       .  traMADol (ULTRAM) 50 MG tablet TAKE 1 TABLET EVERY 8 HOURS AS NEEDED  FOR  PAIN--fax [phone]  120 tablet  0   No current facility-administered medications for this encounter.    REVIEW OF SYSTEMS:  A 15 point review of systems is documented in the electronic medical record. This was obtained by the nursing staff. However, I reviewed this with the patient to discuss relevant findings and make appropriate changes.  Pertinent items are noted in HPI.  PHYSICAL EXAM: There were no vitals filed for this visit.. . He is a pleasant female in no distress sitting comfortably on examining table. She has no palpable cervical or supraclavicular adenopathy. No palpable abnormalities of the right or left breast. She has a biopsy site in the upper outer quadrant of the left breast. She is alert and oriented x3. She appears younger than her stated age. He is alert and  oriented x3.  LABORATORY DATA:  Lab Results  Component Value Date   WBC 5.3 11/15/2013   HGB 11.1* 11/15/2013   HCT 34.3* 11/15/2013   MCV 95.3 11/15/2013   PLT 221 11/15/2013   Lab Results  Component Value Date   NA 140 11/15/2013   K 3.5 11/15/2013   CL 98 04/26/2013   CO2 29 11/15/2013   Lab Results  Component Value Date   ALT 24 11/15/2013   AST 24 11/15/2013   ALKPHOS 61 11/15/2013   BILITOT 0.43 11/15/2013     RADIOGRAPHY: No results found.    IMPRESSION: T1 B. N0 left breast cancer  PLAN: I discussed with Ms. Bagby her options for treatment. We discussed the equivalency in terms of survival between mastectomy and lumpectomy. We discussed the results of randomized trials looking elderly women comparing lumpectomy plus radiation plus antiestrogen therapy versus lumpectomy plus antiestrogen therapy alone. We discussed radiation showed improvement in local control. We discussed that the efficacy for of radiation was really in the first 2-3 months. We discussed that if she chose to have antiestrogen therapy she would have to agree to regular screening mammograms and radiation at recurrence.  She has elected for lumpectomy plus antiestrogen therapy. I have not scheduled followup with her. I would be happy to see her back on an as-needed basis. As no surgery, medical oncology, physical therapy and a member of our patient family support staff. I spent 40 minutes  face to face with the patient and more than 50% of that time was spent in counseling and/or coordination of care.   ------------------------------------------------  [name], MD

## 2013-11-15 NOTE — Progress Notes (Signed)
[name]  [phone] Fax:([phone]) [phone]     ID: [name] OB: 01/21/1936  MR#: [id number]  QMG#:[id number]  PCP: Salena Saner., MD GYN:  [name] SU: [name] OTHER MD: [name]  CHIEF COMPLAINT: "I have breast cancer"  HISTORY OF PRESENT ILLNESS: The patient underwent routine screening mammography at [facility] 10/09/2013 showing a 1 cm cluster of calcifications in the right breast at 5:00. There was also a 9 mm irregular density in the left breast laterally. Bilateral diagnostic mammography with tomography at [facility] 11/01/2013 confirmed a 1 cm cluster of vascular calcifications at the 5:00 position of the right breast, which were felt to be stable. However in the left breast there was a new 1.1 cm mass with spiculated margins. This was at 1:00, 8 cm from the nipple. Ultrasound of the left breast the same day showed the mass to measure 6 mm and to be hypoechoic. The left axilla was unremarkable.  Biopsy of the left breast mass in question 11/06/2013 showed ([id number]) and invasive ductal carcinoma, grade 2, estrogen receptor 100% positive, progesterone receptor 95% positive, both with strong staining intensity, with an MIB-1 of 19% and no HER-2 amplification, the signals ratio being 0.95 and the number per cell 2.00. E-cadherin was positive.  The patient's subsequent history is as detailed below  INTERVAL HISTORY: [name] was evaluated in the multidisciplinary breast cancer clinic 11/15/2013 accompanied by her daughter [name] and her granddaughter [name]: There were no specific symptoms leading to the original mammogram, which was routinely scheduled. The patient denies unusual headaches, visual changes, nausea, vomiting, stiff neck, dizziness, or gait imbalance. There has been no cough, phlegm production, or pleurisy, no chest pain or pressure, and no change in bowel or bladder habits. The patient denies fever,  rash, bleeding,  or unexplained weight loss. She describes herself as mildly fatigued. She does have "arthritis in my back and legs" which is not more intense or persistent than before. Sometimes her feet swell. She feels short of breath when walking up stairs and she sleeps on 2 pillows. She has some stress urinary incontinence., A detailed review of systems was otherwise entirely negative.  PAST MEDICAL HISTORY: Past Medical History  Diagnosis Date  . Colon polyp   . Chronic rhinitis   . Morbid obesity   . DJD (degenerative joint disease)   . Hypertension   . Chronic renal insufficiency   . Memory loss     PAST SURGICAL HISTORY: Past Surgical History  Procedure Laterality Date  . Cataract extraction  2009    rt  . Vesicovaginal fistula closure w/ tah  1980  . Total knee arthroplasty  2002    rt  . Total knee arthroplasty  2003    left   status post hysterectomy with bilateral sloping oophorectomy  FAMILY HISTORY Family History  Problem Relation Age of Onset  . Heart disease      remote family  . Lung cancer Brother     was a smoker   the patient's mother died at the age of 74, with a history of colon cancer. The patient's father died at the age of 43 with "stomach cancer". The patient had 5 brothers, 3 sisters. One brother had lung cancer diagnosed at age 75. One maternal aunt had colon cancer at age 52. There is no history of breast or ovarian cancer in the family to her knowledge.  GYNECOLOGIC HISTORY:  Menarche age 4, first live birth age  24, the patient is GX P2. She underwent hysterectomy with bilateral salpingo-oophorectomy approximately 34 years ago. She did not use hormone replacement. She did use birth control remotely, for about 15 years, with no complications.  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 She is widowed, lives alone  with no pets. Son [name] died from a myocardial infarction.  Daughter [name] works in [place] as an Microbiologist (at [employer]). The patient has 2 grandchildren and one great-grandchild. She is a Psychologist, forensic.    ADVANCED DIRECTIVES: In place; the patient's daughter [name] is her healthcare power of attorney. [name] can be reached at [phone] (cell), and [phone] (work).   HEALTH MAINTENANCE: History  Substance Use Topics  . Smoking status: Never Smoker   . Smokeless tobacco: Not on file  . Alcohol Use: No     Colonoscopy:  PAP:  Bone density:  Lipid panel:  Allergies  Allergen Reactions  . Iron     REACTION: hives/whelps/bad constipation    Current Outpatient Prescriptions  Medication Sig Dispense Refill  . Acetaminophen (TYLENOL ARTHRITIS PAIN PO) Take by mouth. Take as directed       . aspirin 81 MG tablet Take 81 mg by mouth daily.        . calcium-vitamin D (OSCAL) 250-125 MG-UNIT per tablet Take 2 tablets by mouth daily.      . Cholecalciferol (VITAMIN D3) 2000 UNITS TABS Take 1 tablet by mouth daily.       [name] dextromethorphan (DELSYM) 30 MG/5ML liquid Take 2 tsp twice daily as needed       . famotidine (PEPCID) 20 MG tablet One at bedtime  30 tablet  11  . ferrous sulfate 325 (65 FE) MG EC tablet Take 325 mg by mouth daily with breakfast.        . fexofenadine (ALLEGRA) 180 MG tablet Take 180 mg by mouth daily as needed.        [name] glucosamine-chondroitin 500-400 MG tablet Take 1 tablet by mouth daily.       . hydrochlorothiazide (HYDRODIURIL) 25 MG tablet Take 1 tablet (25 mg total) by mouth daily.  90 tablet  3  . Multiple Vitamins-Minerals (CENTRUM SILVER PO) Take 1 tablet by mouth daily.        [name] omeprazole (PRILOSEC) 20 MG capsule Take 1 capsule (20 mg total) by mouth daily.  30 capsule  1  . Simethicone (GAS-X PO) Take by mouth. Take as directed       . traMADol (ULTRAM) 50 MG tablet TAKE 1 TABLET EVERY 8 HOURS AS NEEDED  FOR  PAIN--fax [phone]  120 tablet  0   No current facility-administered  medications for this visit.    OBJECTIVE:  Filed Vitals:   11/15/13 0906  BP: 161/65  Pulse: 73  Temp: 98 F (36.7 C)  Resp: 20     Body mass index is 28.71 kg/(m^2).    ECOG FS:1 - Symptomatic but completely ambulatory  Ocular: Sclerae unicteric, bilateral arcus senilis Ear-nose-throat: Oropharynx clear, no thrush or other lesions Lymphatic: No cervical or supraclavicular adenopathy Lungs no rales or rhonchi, good excursion bilaterally Heart regular rate and rhythm, no murmur appreciated Abd soft, nontender, positive bowel sounds MSK no focal spinal tenderness, no joint edema Neuro: non-focal, well-oriented, appropriate affect Breasts: The right breast is unremarkable. The left breast is status post recent biopsy. I do not palpate a mass and I don't  see any significant skin or nipple change of concern. The left axilla is benign.  LAB RESULTS:  CMP     Component Value Date/Time   NA 140 11/15/2013 0851   NA 135 04/26/2013 1104   K 3.5 11/15/2013 0851   K 3.5 04/26/2013 1104   CL 98 04/26/2013 1104   CO2 29 11/15/2013 0851   CO2 31 04/26/2013 1104   GLUCOSE 107 11/15/2013 0851   GLUCOSE 107* 04/26/2013 1104   BUN 23.0 11/15/2013 0851   BUN 19 04/26/2013 1104   CREATININE 1.2* 11/15/2013 0851   CREATININE 1.2 04/26/2013 1104   CALCIUM 9.6 11/15/2013 0851   CALCIUM 9.4 04/26/2013 1104   PROT 7.2 11/15/2013 0851   PROT 7.6 10/13/2012 1046   ALBUMIN 3.6 11/15/2013 0851   ALBUMIN 3.9 10/13/2012 1046   AST 24 11/15/2013 0851   AST 29 10/13/2012 1046   ALT 24 11/15/2013 0851   ALT 26 10/13/2012 1046   ALKPHOS 61 11/15/2013 0851   ALKPHOS 62 10/13/2012 1046   BILITOT 0.43 11/15/2013 0851   BILITOT 0.6 10/13/2012 1046   GFRNONAA 42.15* 10/24/2010 1001   GFRAA 48 10/30/2008 0928    I No results found for this basename: SPEP, UPEP,  kappa and lambda light chains    Lab Results  Component Value Date   WBC 5.3 11/15/2013   NEUTROABS 2.7 11/15/2013   HGB 11.1* 11/15/2013   HCT 34.3* 11/15/2013    MCV 95.3 11/15/2013   PLT 221 11/15/2013      Chemistry      Component Value Date/Time   NA 140 11/15/2013 0851   NA 135 04/26/2013 1104   K 3.5 11/15/2013 0851   K 3.5 04/26/2013 1104   CL 98 04/26/2013 1104   CO2 29 11/15/2013 0851   CO2 31 04/26/2013 1104   BUN 23.0 11/15/2013 0851   BUN 19 04/26/2013 1104   CREATININE 1.2* 11/15/2013 0851   CREATININE 1.2 04/26/2013 1104      Component Value Date/Time   CALCIUM 9.6 11/15/2013 0851   CALCIUM 9.4 04/26/2013 1104   ALKPHOS 61 11/15/2013 0851   ALKPHOS 62 10/13/2012 1046   AST 24 11/15/2013 0851   AST 29 10/13/2012 1046   ALT 24 11/15/2013 0851   ALT 26 10/13/2012 1046   BILITOT 0.43 11/15/2013 0851   BILITOT 0.6 10/13/2012 1046       No results found for this basename: LABCA2    No components found with this basename: LABCA125    No results found for this basename: INR,  in the last 168 hours  Urinalysis    Component Value Date/Time   COLORURINE LT. YELLOW 08/24/2011 [place] 08/24/2011 1033   LABSPEC 1.010 08/24/2011 1033   PHURINE 6.0 08/24/2011 [place] 08/24/2011 [place] 08/24/2011 [place] 08/24/2011 1033   UROBILINOGEN 0.2 08/24/2011 1033   NITRITE NEGATIVE 08/24/2011 [place] 08/24/2011 1033    STUDIES: Outside films were reviewed with the patient and her family.   ASSESSMENT: 78 y.o. [place] woman status post left breast biopsy 11/06/2013 for a clinical T1n N0, stage IA invasive ductal carcinoma, grade 2, estrogen receptor 100% positive, progesterone receptor 95% positive, with an MIB-1 of 19% and no HER-2 amplification  PLAN: We spent the better part of today's hour-long appointment discussing the biology of breast cancer in general, and the specifics of the patient's tumor in particular. Doralee understands  with the data we have available today him a the benefit of chemotherapy would be so small that we do not recommend. On  the other hand from a systemic point of view she will benefit from antiestrogen, which in her case likely will be aromatase inhibitors.  Unless there is a surprise from the surgery (a larger tumor than expected, a positive lymph nodes, or positive margin) she may be able to avoid radiation assuming she tolerates the aromatase inhibitors well. We will need to sit down and discuss this further with the actual surgical pathology in hand, and I am making her a return appointment with me in approximately 6 weeks to make those decisions.  The patient has a good understanding of the overall plan. She knows to goal of treatment is cure. She will call with any problems that may develop before next visit here.   Chauncey Cruel, MD   11/15/2013 9:58 AM

## 2013-11-15 NOTE — Telephone Encounter (Signed)
, °

## 2013-11-15 NOTE — Progress Notes (Signed)
Pt signed a ROI and I took to Med Rec to scan.  Pt signed a [place] form and I faxed it to [place] at [employer] and filed it.

## 2013-11-15 NOTE — Progress Notes (Addendum)
 Re:   [name] DOB:   06/05/1936 MRN:   [id number]  [facility]  ASSESSMENT AND PLAN: 1.  Left breast cancer, 2 o'clock (T1, N0)  Path - 11/06/2013 (Accession#: [id number]) - Invasive mammary carcinoma, ER - 100%, PR- 95%, Ki67 - 19%, Her2Neu - neg.  Oncologist - [place]/Magrinat  I discussed the options for breast cancer treatment with the patient.   I discussed the surgical options of lumpectomy vs. mastectomy.  If mastectomy, there is the possibility of reconstruction.   I discussed the options of lymph node biopsy.  The treatment plan depends on the pathologic staging of the tumor and the patient's personal wishes.  The risks of surgery include, but are not limited to, bleeding, infection, the need for further surgery, and nerve injury.  The patient has been given literature on the treatment of breast cancer.  She is a good candidate for lumpectomy  Plan: 1) Left breast lumpectomy and left axillary lymph node biopsy, 2) Anti-estrogen tx, 3) At this time, she is not going to have radiation tx  2.  Arthritis 3.  HTN 4.  Chronic renal insufficiency - Creat. 1.2 - 11/15/2013 5.  On aspirin.  To stop 7 days before surgery  No chief complaint on file.  REFERRING PHYSICIAN: SHELTON,KIMBERLY R., MD  HISTORY OF PRESENT ILLNESS: [name] is a 78 y.o. (DOB: 09/20/1936)  AA  female whose primary care physician is SHELTON,KIMBERLY R., MD (she saw Takia Strkes, NP. She was a patient of Dr. Wert for years) and comes to the [facility] for a new left breast cancer. Her daughter, [name] and her granddaughter, [name], are with her.  She gets annual mammograms.  She had a routine mammogram at [facility] on 10/09/2014 which showed a 0.9 cm irregular nodular density in the left breast.  She could not feel this mass.  There was also a abnormality of the right breast, but on follow up, this was felt to be benign.  A core biopsy performed 11/06/2013 showed  (Accession#:  [id number]) - Invasive mammary carcinoma, ER - 100%, PR- 95%, Ki67 - 19%.  She has no family history of breast cancer.  She does not take hormone meds.  She has had no prior breast surgery.  She had a hysterectomy in 1980 for benign disease.   Past Medical History  Diagnosis Date  . Colon polyp   . Chronic rhinitis   . Morbid obesity   . DJD (degenerative joint disease)   . Hypertension   . Chronic renal insufficiency   . Memory loss       Past Surgical History  Procedure Laterality Date  . Cataract extraction  2009    rt  . Vesicovaginal fistula closure w/ tah  1980  . Total knee arthroplasty  2002    rt  . Total knee arthroplasty  2003    left      Current Outpatient Prescriptions  Medication Sig Dispense Refill  . Acetaminophen (TYLENOL ARTHRITIS PAIN PO) Take by mouth. Take as directed       . aspirin 81 MG tablet Take 81 mg by mouth daily.        . calcium-vitamin D (OSCAL) 250-125 MG-UNIT per tablet Take 2 tablets by mouth daily.      . Cholecalciferol (VITAMIN D3) 2000 UNITS TABS Take 1 tablet by mouth daily.       . dextromethorphan (DELSYM) 30 MG/5ML liquid Take 2 tsp twice daily as needed       .   famotidine (PEPCID) 20 MG tablet One at bedtime  30 tablet  11  . ferrous sulfate 325 (65 FE) MG EC tablet Take 325 mg by mouth daily with breakfast.        . fexofenadine (ALLEGRA) 180 MG tablet Take 180 mg by mouth daily as needed.        [name] glucosamine-chondroitin 500-400 MG tablet Take 1 tablet by mouth daily.       . hydrochlorothiazide (HYDRODIURIL) 25 MG tablet Take 1 tablet (25 mg total) by mouth daily.  90 tablet  3  . Multiple Vitamins-Minerals (CENTRUM SILVER PO) Take 1 tablet by mouth daily.        [name] omeprazole (PRILOSEC) 20 MG capsule Take 1 capsule (20 mg total) by mouth daily.  30 capsule  1  . Simethicone (GAS-X PO) Take by mouth. Take as directed       . traMADol (ULTRAM) 50 MG tablet TAKE 1 TABLET EVERY 8 HOURS AS NEEDED  FOR  PAIN--fax [phone]  120  tablet  0   No current facility-administered medications for this visit.      Allergies  Allergen Reactions  . Iron     REACTION: hives/whelps/bad constipation    REVIEW OF SYSTEMS: Skin:  No history of rash.  No history of abnormal moles. Infection:  No history of hepatitis or HIV.  No history of MRSA. Neurologic:  No history of stroke.  No history of seizure.  No history of headaches. Cardiac:  Hypertension. Pulmonary:  Does not smoke cigarettes.  No asthma or bronchitis.  No OSA/CPAP.  Endocrine:  No diabetes. No thyroid disease. Gastrointestinal:  No history of stomach disease.  No history of liver disease.  No history of gall bladder disease.  No history of pancreas disease.  No history of colon disease. Urologic:  No history of kidney stones.  No history of bladder infections. GYN:  Vesicovaginal fistula closure with hyterectomy in 1980. Musculoskeletal:  Arthritis.  Right total knee - 2002 - [place].  Left total knee - 2003 - [place]. Hematologic:  On aspirin Psycho-social:  The patient is oriented.   The patient has no obvious psychologic or social impairment to understanding our conversation and plan.  SOCIAL and FAMILY HISTORY: Widowed Her daughter, Felicia Liv "Shawnette Acosta and her granddaughter, Vanetta Shawl, are with her. "[name]" is her only living child.  She had a son that died about 1 year ago. 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  PHYSICAL EXAM: There were no vitals taken for this visit.  General: WN older AA F who is alert and generally healthy appearing.  HEENT: Normal. Pupils equal. Neck: Supple. No mass.  No thyroid mass. Lymph Nodes:  No supraclavicular, cervical, or axillary nodes. Lungs: Clear to auscultation and symmetric breath sounds. Heart:  RRR. No murmur or rub. Breasts:  Right - normal, no mass  Left - scar at 10 o'clock, but no mass  Abdomen: Soft. No mass. No tenderness. No hernia. Normal bowel sounds.   No abdominal scars. Rectal: Not done. Extremities:  Good strength and ROM  in upper and lower extremities. Neurologic:  Grossly intact to motor and sensory function. Psychiatric: Has normal mood and affect. Behavior is normal.   DATA REVIEWED: Epic notes. [facility] notes.  [name], MD,  [facility], [address] [place]  [place], [place]    [place] Phone:  [phone] FAX:  [phone]

## 2013-11-15 NOTE — Progress Notes (Signed)
Checked in new patient and she has her [facility] forms and appt card.

## 2013-11-20 ENCOUNTER — Encounter: Payer: Self-pay | Admitting: *Deleted

## 2013-11-20 ENCOUNTER — Telehealth: Payer: Self-pay | Admitting: *Deleted

## 2013-11-20 NOTE — Progress Notes (Signed)
[place] Psychosocial Distress Screening Clinical Social Work  Patient completed distress screening protocol, and scored a 5 on the Psychosocial Distress Thermometer which indicates moderate distress. Clinical Social Worker met with pt in [facility] on 11/15/13 to assess for distress and other psychosocial needs.  Pt stated she felt much better after meeting with the treatment team and getting more information on her treatment plan.  CSW informed pt of the support services and support team at [facility], encouraged pt to call with any additional questions or concerns.  Johnnye Lana, MSW, [place] Worker [facility] [phone]

## 2013-11-20 NOTE — Telephone Encounter (Signed)
Faxed Care Plan & office notes to [facility] at [facility] & to the PCP.  Took Care Plan to Med Rec to scan.

## 2013-11-21 ENCOUNTER — Telehealth: Payer: Self-pay | Admitting: *Deleted

## 2013-11-21 NOTE — Telephone Encounter (Signed)
Spoke to pt concerning [place] from 11/15/13.  Pt denies questions or concerns regarding dx or treatment care plan.  Confirmed surgery date and f/u appt with Dr. Jana Hakim.  Encourage pt to call with needs.  Received verbal understanding.  Contact information given.

## 2013-11-27 ENCOUNTER — Encounter (HOSPITAL_BASED_OUTPATIENT_CLINIC_OR_DEPARTMENT_OTHER): Payer: Self-pay | Admitting: *Deleted

## 2013-11-27 NOTE — Progress Notes (Signed)
To come in for ekg-had labs cc-11/15/13-cbc cmet

## 2013-11-29 ENCOUNTER — Encounter (HOSPITAL_BASED_OUTPATIENT_CLINIC_OR_DEPARTMENT_OTHER)
Admission: RE | Admit: 2013-11-29 | Discharge: 2013-11-29 | Disposition: A | Discharge status: Home / Self Care | Payer: Medicare PPO | Source: Ambulatory Visit | Attending: Surgery | Admitting: Surgery

## 2013-11-29 ENCOUNTER — Other Ambulatory Visit: Payer: Self-pay

## 2013-11-29 DIAGNOSIS — Z01810 Encounter for preprocedural cardiovascular examination: Secondary | ICD-10-CM | POA: Insufficient documentation

## 2013-12-04 ENCOUNTER — Encounter (HOSPITAL_BASED_OUTPATIENT_CLINIC_OR_DEPARTMENT_OTHER): Payer: Self-pay | Admitting: Anesthesiology

## 2013-12-04 ENCOUNTER — Ambulatory Visit (HOSPITAL_BASED_OUTPATIENT_CLINIC_OR_DEPARTMENT_OTHER): Payer: Medicare PPO | Admitting: Anesthesiology

## 2013-12-04 ENCOUNTER — Encounter (HOSPITAL_BASED_OUTPATIENT_CLINIC_OR_DEPARTMENT_OTHER)
Admission: RE | Disposition: A | Discharge status: Home / Self Care | Payer: Self-pay | Source: Ambulatory Visit | Attending: Surgery

## 2013-12-04 ENCOUNTER — Encounter (HOSPITAL_COMMUNITY)
Admission: RE | Admit: 2013-12-04 | Discharge: 2013-12-04 | Disposition: A | Discharge status: Home / Self Care | Payer: Medicare PPO | Source: Ambulatory Visit | Attending: Surgery | Admitting: Surgery

## 2013-12-04 ENCOUNTER — Encounter (HOSPITAL_BASED_OUTPATIENT_CLINIC_OR_DEPARTMENT_OTHER): Payer: Medicare PPO | Admitting: Anesthesiology

## 2013-12-04 ENCOUNTER — Ambulatory Visit (HOSPITAL_BASED_OUTPATIENT_CLINIC_OR_DEPARTMENT_OTHER)
Admission: RE | Admit: 2013-12-04 | Discharge: 2013-12-04 | Disposition: A | Discharge status: Home / Self Care | Payer: Medicare PPO | Source: Ambulatory Visit | Attending: Surgery | Admitting: Surgery

## 2013-12-04 DIAGNOSIS — C50919 Malignant neoplasm of unspecified site of unspecified female breast: Secondary | ICD-10-CM

## 2013-12-04 DIAGNOSIS — I2510 Atherosclerotic heart disease of native coronary artery without angina pectoris: Secondary | ICD-10-CM | POA: Insufficient documentation

## 2013-12-04 DIAGNOSIS — I509 Heart failure, unspecified: Secondary | ICD-10-CM | POA: Insufficient documentation

## 2013-12-04 DIAGNOSIS — K219 Gastro-esophageal reflux disease without esophagitis: Secondary | ICD-10-CM | POA: Insufficient documentation

## 2013-12-04 DIAGNOSIS — C50419 Malignant neoplasm of upper-outer quadrant of unspecified female breast: Secondary | ICD-10-CM | POA: Insufficient documentation

## 2013-12-04 DIAGNOSIS — I10 Essential (primary) hypertension: Secondary | ICD-10-CM | POA: Insufficient documentation

## 2013-12-04 DIAGNOSIS — Z95810 Presence of automatic (implantable) cardiac defibrillator: Secondary | ICD-10-CM | POA: Insufficient documentation

## 2013-12-04 DIAGNOSIS — I252 Old myocardial infarction: Secondary | ICD-10-CM | POA: Insufficient documentation

## 2013-12-04 DIAGNOSIS — C50412 Malignant neoplasm of upper-outer quadrant of left female breast: Secondary | ICD-10-CM

## 2013-12-04 HISTORY — PX: BREAST LUMPECTOMY WITH NEEDLE LOCALIZATION AND AXILLARY SENTINEL LYMPH NODE BX: SHX5760

## 2013-12-04 HISTORY — DX: Complete loss of teeth, unspecified cause, unspecified class: K08.109

## 2013-12-04 HISTORY — DX: Complete loss of teeth, unspecified cause, unspecified class: Z97.2

## 2013-12-04 HISTORY — DX: Gastro-esophageal reflux disease without esophagitis: K21.9

## 2013-12-04 HISTORY — DX: Presence of spectacles and contact lenses: Z97.3

## 2013-12-04 LAB — HM MAMMOGRAPHY

## 2013-12-04 LAB — POCT HEMOGLOBIN-HEMACUE: Hemoglobin: 12.1 g/dL (ref 12.0–15.0)

## 2013-12-04 SURGERY — BREAST LUMPECTOMY WITH NEEDLE LOCALIZATION AND AXILLARY SENTINEL LYMPH NODE BX
Anesthesia: General | Laterality: Left

## 2013-12-04 MED ORDER — LACTATED RINGERS IV SOLN
INTRAVENOUS | Status: DC
  Administered 2013-12-04: 12:00:00 via INTRAVENOUS

## 2013-12-04 MED ORDER — FENTANYL CITRATE 0.05 MG/ML IJ SOLN
INTRAMUSCULAR | Status: DC | PRN
  Administered 2013-12-04: 25 ug via INTRAVENOUS
  Administered 2013-12-04: 50 ug via INTRAVENOUS

## 2013-12-04 MED ORDER — SODIUM CHLORIDE 0.9 % IJ SOLN
INTRAMUSCULAR | Status: AC
  Filled 2013-12-04: qty 10

## 2013-12-04 MED ORDER — PROMETHAZINE HCL 25 MG/ML IJ SOLN: 6.2500 mg | INTRAMUSCULAR | Status: DC | PRN

## 2013-12-04 MED ORDER — CEFAZOLIN SODIUM-DEXTROSE 2-3 GM-% IV SOLR
INTRAVENOUS | Status: AC
  Filled 2013-12-04: qty 50

## 2013-12-04 MED ORDER — PROPOFOL 10 MG/ML IV BOLUS
INTRAVENOUS | Status: DC | PRN
  Administered 2013-12-04: 140 mg via INTRAVENOUS

## 2013-12-04 MED ORDER — SODIUM CHLORIDE 0.9 % IJ SOLN
INTRAMUSCULAR | Status: DC | PRN
  Administered 2013-12-04: 1 mL

## 2013-12-04 MED ORDER — BUPIVACAINE HCL (PF) 0.25 % IJ SOLN
INTRAMUSCULAR | Status: DC | PRN
  Administered 2013-12-04: 30 mL

## 2013-12-04 MED ORDER — CHLORHEXIDINE GLUCONATE 4 % EX LIQD
1.0000 | Freq: Once | CUTANEOUS | Status: DC
Start: 2013-12-05 — End: 2013-12-04

## 2013-12-04 MED ORDER — HYDROCODONE-ACETAMINOPHEN 5-325 MG PO TABS: 1.0000 | ORAL_TABLET | Freq: Four times a day (QID) | ORAL | Status: DC | PRN

## 2013-12-04 MED ORDER — EPHEDRINE SULFATE 50 MG/ML IJ SOLN
INTRAMUSCULAR | Status: DC | PRN
Start: 2013-12-04 — End: 2013-12-04
  Administered 2013-12-04 (×3): 10 mg via INTRAVENOUS

## 2013-12-04 MED ORDER — TECHNETIUM TC 99M SULFUR COLLOID FILTERED
1.0000 | Freq: Once | INTRAVENOUS | Status: AC | PRN
  Administered 2013-12-04: 1 via INTRADERMAL

## 2013-12-04 MED ORDER — METHYLENE BLUE 1 % INJ SOLN
INTRAMUSCULAR | Status: AC
  Filled 2013-12-04: qty 10

## 2013-12-04 MED ORDER — METHYLENE BLUE 1 % INJ SOLN
INTRAMUSCULAR | Status: DC | PRN
  Administered 2013-12-04: 1 mL via SUBMUCOSAL

## 2013-12-04 MED ORDER — CEFAZOLIN SODIUM-DEXTROSE 2-3 GM-% IV SOLR
2.0000 g | INTRAVENOUS | Status: AC
  Administered 2013-12-04: 2 g via INTRAVENOUS

## 2013-12-04 MED ORDER — ACETAMINOPHEN 160 MG/5ML PO SOLN: 325.0000 mg | ORAL | Status: DC | PRN

## 2013-12-04 MED ORDER — OXYCODONE HCL 5 MG/5ML PO SOLN: 5.0000 mg | Freq: Once | ORAL | Status: AC | PRN

## 2013-12-04 MED ORDER — ACETAMINOPHEN 325 MG PO TABS: 325.0000 mg | ORAL_TABLET | ORAL | Status: DC | PRN

## 2013-12-04 MED ORDER — DEXAMETHASONE SODIUM PHOSPHATE 4 MG/ML IJ SOLN: INTRAMUSCULAR | Status: DC | PRN

## 2013-12-04 MED ORDER — MIDAZOLAM HCL 2 MG/2ML IJ SOLN
1.0000 mg | INTRAMUSCULAR | Status: DC | PRN
  Administered 2013-12-04: 1 mg via INTRAVENOUS

## 2013-12-04 MED ORDER — FENTANYL CITRATE 0.05 MG/ML IJ SOLN
50.0000 ug | INTRAMUSCULAR | Status: DC | PRN
  Administered 2013-12-04: 50 ug via INTRAVENOUS

## 2013-12-04 MED ORDER — LIDOCAINE HCL (CARDIAC) 20 MG/ML IV SOLN
INTRAVENOUS | Status: DC | PRN
  Administered 2013-12-04: 60 mg via INTRAVENOUS

## 2013-12-04 MED ORDER — FENTANYL CITRATE 0.05 MG/ML IJ SOLN
INTRAMUSCULAR | Status: AC
  Filled 2013-12-04: qty 4

## 2013-12-04 MED ORDER — OXYCODONE HCL 5 MG PO TABS
5.0000 mg | ORAL_TABLET | Freq: Once | ORAL | Status: AC | PRN
  Administered 2013-12-04: 5 mg via ORAL

## 2013-12-04 MED ORDER — DEXAMETHASONE SODIUM PHOSPHATE 4 MG/ML IJ SOLN
INTRAMUSCULAR | Status: DC | PRN
Start: 2013-12-04 — End: 2013-12-04
  Administered 2013-12-04: 10 mg via INTRAVENOUS

## 2013-12-04 MED ORDER — KETOROLAC TROMETHAMINE 30 MG/ML IJ SOLN: 15.0000 mg | Freq: Once | INTRAMUSCULAR | Status: DC | PRN

## 2013-12-04 MED ORDER — FENTANYL CITRATE 0.05 MG/ML IJ SOLN
INTRAMUSCULAR | Status: AC
  Filled 2013-12-04: qty 2

## 2013-12-04 MED ORDER — OXYCODONE HCL 5 MG PO TABS
ORAL_TABLET | ORAL | Status: AC
  Filled 2013-12-04: qty 1

## 2013-12-04 MED ORDER — 0.9 % SODIUM CHLORIDE (POUR BTL) OPTIME
TOPICAL | Status: DC | PRN
  Administered 2013-12-04: 400 mL

## 2013-12-04 MED ORDER — ONDANSETRON HCL 4 MG/2ML IJ SOLN
INTRAMUSCULAR | Status: DC | PRN
  Administered 2013-12-04: 4 mg via INTRAVENOUS

## 2013-12-04 MED ORDER — CHLORHEXIDINE GLUCONATE 4 % EX LIQD
1.0000 | Freq: Once | CUTANEOUS | Status: DC
Start: 2013-12-04 — End: 2013-12-04

## 2013-12-04 MED ORDER — MIDAZOLAM HCL 2 MG/2ML IJ SOLN
INTRAMUSCULAR | Status: AC
  Filled 2013-12-04: qty 2

## 2013-12-04 MED ORDER — FENTANYL CITRATE 0.05 MG/ML IJ SOLN: 25.0000 ug | INTRAMUSCULAR | Status: DC | PRN

## 2013-12-04 SURGICAL SUPPLY — 64 items
ADH SKN CLS APL DERMABOND .7 (GAUZE/BANDAGES/DRESSINGS) ×1
APPLIER CLIP 11 MED OPEN (CLIP)
APR CLP MED 11 20 MLT OPN (CLIP)
BANDAGE ELASTIC 6 VELCRO ST LF (GAUZE/BANDAGES/DRESSINGS) IMPLANT
BINDER BREAST LRG (GAUZE/BANDAGES/DRESSINGS) IMPLANT
BINDER BREAST MEDIUM (GAUZE/BANDAGES/DRESSINGS) IMPLANT
BINDER BREAST XLRG (GAUZE/BANDAGES/DRESSINGS) ×1 IMPLANT
BINDER BREAST XXLRG (GAUZE/BANDAGES/DRESSINGS) IMPLANT
BLADE SURG 10 STRL SS (BLADE) ×2 IMPLANT
BLADE SURG 15 STRL LF DISP TIS (BLADE) ×1 IMPLANT
BLADE SURG 15 STRL SS (BLADE) ×2
CANISTER SUCT 1200ML W/VALVE (MISCELLANEOUS) ×2 IMPLANT
CHLORAPREP W/TINT 26ML (MISCELLANEOUS) ×2 IMPLANT
CLIP APPLIE 11 MED OPEN (CLIP) IMPLANT
CLIP TI WIDE RED SMALL 6 (CLIP) ×1 IMPLANT
COVER MAYO STAND STRL (DRAPES) ×2 IMPLANT
COVER PROBE W GEL 5X96 (DRAPES) ×2 IMPLANT
COVER TABLE BACK 60X90 (DRAPES) ×2 IMPLANT
DECANTER SPIKE VIAL GLASS SM (MISCELLANEOUS) IMPLANT
DERMABOND ADVANCED (GAUZE/BANDAGES/DRESSINGS) ×1
DERMABOND ADVANCED .7 DNX12 (GAUZE/BANDAGES/DRESSINGS) ×1 IMPLANT
DEVICE DUBIN W/COMP PLATE 8390 (MISCELLANEOUS) ×2 IMPLANT
DRAIN CHANNEL 19F RND (DRAIN) IMPLANT
DRAIN HEMOVAC 1/8 X 5 (WOUND CARE) IMPLANT
DRAPE LAPAROSCOPIC ABDOMINAL (DRAPES) ×2 IMPLANT
DRAPE UTILITY XL STRL (DRAPES) ×2 IMPLANT
ELECT COATED BLADE 2.86 ST (ELECTRODE) ×2 IMPLANT
ELECT REM PT RETURN 9FT ADLT (ELECTROSURGICAL) ×2
ELECTRODE REM PT RTRN 9FT ADLT (ELECTROSURGICAL) ×1 IMPLANT
EVACUATOR SILICONE 100CC (DRAIN) IMPLANT
GAUZE SPONGE 4X4 16PLY XRAY LF (GAUZE/BANDAGES/DRESSINGS) IMPLANT
GLOVE BIO SURGEON STRL SZ7 (GLOVE) ×1 IMPLANT
GLOVE BIOGEL PI IND STRL 7.0 (GLOVE) IMPLANT
GLOVE BIOGEL PI INDICATOR 7.0 (GLOVE) ×1
GLOVE SURG SIGNA 7.5 PF LTX (GLOVE) ×3 IMPLANT
GOWN STRL REUS W/ TWL LRG LVL3 (GOWN DISPOSABLE) ×1 IMPLANT
GOWN STRL REUS W/ TWL XL LVL3 (GOWN DISPOSABLE) ×1 IMPLANT
GOWN STRL REUS W/TWL LRG LVL3 (GOWN DISPOSABLE) ×2
GOWN STRL REUS W/TWL XL LVL3 (GOWN DISPOSABLE) ×2
KIT MARKER MARGIN INK (KITS) ×1 IMPLANT
NDL HYPO 25X1 1.5 SAFETY (NEEDLE) ×2 IMPLANT
NDL SAFETY ECLIPSE 18X1.5 (NEEDLE) ×1 IMPLANT
NEEDLE HYPO 18GX1.5 SHARP (NEEDLE) ×2
NEEDLE HYPO 25X1 1.5 SAFETY (NEEDLE) ×4 IMPLANT
NS IRRIG 1000ML POUR BTL (IV SOLUTION) ×2 IMPLANT
PACK BASIN DAY SURGERY FS (CUSTOM PROCEDURE TRAY) ×2 IMPLANT
PAD ALCOHOL SWAB (MISCELLANEOUS) ×2 IMPLANT
PENCIL BUTTON HOLSTER BLD 10FT (ELECTRODE) ×2 IMPLANT
PIN SAFETY STERILE (MISCELLANEOUS) IMPLANT
SHEET MEDIUM DRAPE 40X70 STRL (DRAPES) ×2 IMPLANT
SLEEVE SCD COMPRESS KNEE MED (MISCELLANEOUS) ×2 IMPLANT
SPONGE GAUZE 4X4 12PLY (GAUZE/BANDAGES/DRESSINGS) ×1 IMPLANT
SPONGE GAUZE 4X4 12PLY STER LF (GAUZE/BANDAGES/DRESSINGS) IMPLANT
SPONGE LAP 18X18 X RAY DECT (DISPOSABLE) ×2 IMPLANT
SUT ETHILON 2 0 FS 18 (SUTURE) IMPLANT
SUT MON AB 5-0 PS2 18 (SUTURE) ×2 IMPLANT
SUT SILK 3 0 TIES 17X18 (SUTURE)
SUT SILK 3-0 18XBRD TIE BLK (SUTURE) IMPLANT
SUT VICRYL 3-0 CR8 SH (SUTURE) ×2 IMPLANT
SYR CONTROL 10ML LL (SYRINGE) ×4 IMPLANT
TOWEL OR 17X24 6PK STRL BLUE (TOWEL DISPOSABLE) ×4 IMPLANT
TOWEL OR NON WOVEN STRL DISP B (DISPOSABLE) ×2 IMPLANT
TUBE CONNECTING 20X1/4 (TUBING) ×2 IMPLANT
YANKAUER SUCT BULB TIP NO VENT (SUCTIONS) ×2 IMPLANT

## 2013-12-04 NOTE — Interval H&P Note (Signed)
History and Physical Interval Note:  12/04/2013 11:35 AM  [name]  has presented today for surgery, with the diagnosis of left breast cancer  The various methods of treatment have been discussed with the patient and family. The patient is a little confused about the sequence of what we are doing.  But after explaining the planned surgery again to her, I think she understands.  Her daughter, [name], is with her here.  After consideration of risks, benefits and other options for treatment, the patient has consented to  Procedure(s): BREAST LUMPECTOMY WITH NEEDLE LOCALIZATION AND AXILLARY SENTINEL LYMPH NODE BX (Left) as a surgical intervention .  The patient's history has been reviewed, patient examined, no change in status, stable for surgery.  I have reviewed the patient's chart and labs.  Questions were answered to the patient's satisfaction.     Candace Ramus H

## 2013-12-04 NOTE — Progress Notes (Signed)
Assisted nuc med inj . Side rails up, monitors on throughout procedure. See vital signs in flow sheet. Tolerated Procedure well.

## 2013-12-04 NOTE — Discharge Instructions (Signed)
[facility] - DISCHARGE INSTRUCTIONS TO PATIENT  Activity:  Driving - May drive in 2 or 3 days, if doing well.   Lifting - No lifting more than 15 pounds for 5 days, then no limit.  Wound Care:   Leave bandages for 2 days, then remove and shower.  Diet:  As tolerated.  Follow up appointment:  Call Dr. Pollie Friar office [facility]) at [phone] for an appointment in 2 or 3 weeks.  Medications and dosages:  Resume your home medications.  You have a prescription for:  Vicodin  Call Dr. Lucia Gaskins or his office  [phone]) if you have:  Temperature greater than 100.4,  Persistent nausea and vomiting,  Severe uncontrolled pain,  Redness, tenderness, or signs of infection (pain, swelling, redness, odor or green/yellow discharge around the site),  Difficulty breathing, headache or visual disturbances,  Any other questions or concerns you may have after discharge.  In an emergency, call 911 or go to an Emergency Department at a nearby hospital.    Post Anesthesia Home Care Instructions  Activity: Get plenty of rest for the remainder of the day. A responsible adult should stay with you for 24 hours following the procedure.  For the next 24 hours, DO NOT: -Drive a car -Paediatric nurse -Drink alcoholic beverages -Take any medication unless instructed by your physician -Make any legal decisions or sign important papers.  Meals: Start with liquid foods such as gelatin or soup. Progress to regular foods as tolerated. Avoid greasy, spicy, heavy foods. If nausea and/or vomiting occur, drink only clear liquids until the nausea and/or vomiting subsides. Call your physician if vomiting continues.  Special Instructions/Symptoms: Your throat may feel dry or sore from the anesthesia or the breathing tube placed in your throat during surgery. If this causes discomfort, gargle with warm salt water. The discomfort should disappear within 24 hours.

## 2013-12-04 NOTE — Anesthesia Procedure Notes (Signed)
Procedure Name: LMA Insertion Performed by: Toula Moos Pre-anesthesia Checklist: Patient identified, Emergency Drugs available, Suction available, Patient being monitored and Timeout performed Patient Re-evaluated:Patient Re-evaluated prior to inductionOxygen Delivery Method: Circle system utilized Preoxygenation: Pre-oxygenation with 100% oxygen Intubation Type: IV induction Ventilation: Mask ventilation without difficulty LMA: LMA inserted LMA Size: 4.0 Number of attempts: 1 Placement Confirmation: breath sounds checked- equal and bilateral and positive ETCO2 Tube secured with: Tape Dental Injury: Teeth and Oropharynx as per pre-operative assessment

## 2013-12-04 NOTE — Anesthesia Preprocedure Evaluation (Signed)
Anesthesia Evaluation  Patient identified by MRN, date of birth, ID band Patient awake    Reviewed: Allergy & Precautions, H&P , NPO status , Patient's Chart, lab work & pertinent test results  History of Anesthesia Complications Negative for: history of anesthetic complications  Airway Mallampati: II TM Distance: >3 FB Neck ROM: Full    Dental  (+) Upper Dentures and Lower Dentures   Pulmonary neg pulmonary ROS, neg sleep apnea, neg COPD   Pulmonary exam normal       Cardiovascular hypertension, Pt. on medications - angina- CAD, - Past MI and - CHF - dysrhythmias - Cardiac Defibrillator Rhythm:Regular Rate:Normal     Neuro/Psych negative neurological ROS  negative psych ROS   GI/Hepatic Neg liver ROS, GERD-  Medicated and Controlled,  Endo/Other  negative endocrine ROS  Renal/GU negative Renal ROS     Musculoskeletal   Abdominal   Peds  Hematology  (+) anemia ,   Anesthesia Other Findings   Reproductive/Obstetrics                           Anesthesia Physical Anesthesia Plan  ASA: II  Anesthesia Plan: General   Post-op Pain Management:    Induction: Intravenous  Airway Management Planned: LMA  Additional Equipment: None  Intra-op Plan:   Post-operative Plan: Extubation in OR  Informed Consent: I have reviewed the patients History and Physical, chart, labs and discussed the procedure including the risks, benefits and alternatives for the proposed anesthesia with the patient or authorized representative who has indicated his/her understanding and acceptance.   Dental advisory given  Plan Discussed with: CRNA and Surgeon  Anesthesia Plan Comments:         Anesthesia Quick Evaluation

## 2013-12-04 NOTE — H&P (View-Only) (Signed)
Checked in new patient and she has her [facility] forms and appt card.

## 2013-12-04 NOTE — Anesthesia Postprocedure Evaluation (Addendum)
  Anesthesia Post-op Note  Patient: Niaomi J Bua  Procedure(s) Performed: Procedure(s): BREAST LUMPECTOMY WITH NEEDLE LOCALIZATION AND AXILLARY SENTINEL LYMPH NODE BX (Left)  Patient Location: PACU  Anesthesia Type:General  Level of Consciousness: awake, alert  and oriented  Airway and Oxygen Therapy: Patient Spontanous Breathing  Post-op Pain: none  Post-op Assessment: Post-op Vital signs reviewed, Patient's Cardiovascular Status Stable, Respiratory Function Stable, Patent Airway, No signs of Nausea or vomiting and Pain level controlled  Post-op Vital Signs: Reviewed and stable  Complications: No apparent anesthesia complications

## 2013-12-04 NOTE — Op Note (Signed)
12/04/2013  1:38 PM  PATIENT:  [name] DOB: 1936-02-15 MRN: [id number]  PREOP DIAGNOSIS:  Left breast cancer  POSTOP DIAGNOSIS:   Left breast cancer, 2 o'clock position (T1, N0)  PROCEDURE:   Procedure(s): BREAST LUMPECTOMY WITH NEEDLE LOCALIZATION AND AXILLARY SENTINEL LYMPH NODE BX, Injection of peri areolar area of breast with methylene blue (1.0 cc)  SURGEON:   [name], M.D.  ANESTHESIA:   general  Anesthesiologist: Laurie Panda, MD CRNA: Terrance Mass, CRNA  General  EBL:  minimal  ml  DRAINS: none   LOCAL MEDICATIONS USED:   30 cc 1/4 % marcaine  SPECIMEN:   Left breast lumpectomy, left axillary sentinel lymph node.  COUNTS CORRECT:  YES  INDICATIONS FOR PROCEDURE:  [name] is a 78 y.o. (DOB: 1936/01/27) AA  female whose primary care physician is Salena Saner., MD and comes for left breast lumpectomy and left axillary sentinel lymph node biopsy.   Options for breast cancer treatment were discussed with the patient. She elected to proceed with lumpectomy and axillary sentinel lymph node.     The indications and potential complications of surgery were explained to the patient. Potential complications include, but are not limited to, bleeding, infection, the need for further surgery, and nerve injury.     The patient had a needle loc wire placed at the 2 o'clock position of the left breast at [facility] by Dr. Marcelo Baldy.  In the holding area, her left areola was injected with 1 millicurie of Technitium Sulfur Colloid.  OPERATIVE NOTE:   The patient was taken to room # 6 at [facility] where she underwent a general anesthesia  supervised by Anesthesiologist: Laurie Panda, MD CRNA: Terrance Mass, CRNA. Her left breast and axilla were prepped with  ChloraPrep and sterilely draped.    A time-out and the surgical check list was reviewed.    I injected about 1.0 mL of 40% methylene blue around her left areola.   I started with the left  axillary sentinel lymph node biopsy. I found a hot area at the junction of the breast and the pectoralis major muscle. I cut down and  identified a hot node that had counts of 350 and the background has 0 counts. The lymph node was blue. I checked her internal mammary nodes and supraclavicular nodes with the neoprobe and found no other hot area. The axillary node was then sent to pathology.    I turned attention to the cancer which was about at the 2 o'clock position of the left breast. I cut down around the wire and tried to take an ellipse of breast tissue around the tumor by at least 1 cm.   I excised this block of breast tissue approximately 5 cm by 4 cm  in diameter. I did take the dissection down to the pectoralis major. I painted the lumpectomy specimen with the 6 color paint kit and did a specimen mammogram which confirmed the mass, clip and the wire were all in the right position.  Dr. Chelsea Primus did a gross examination for pathology and thought my margin was 1.5 cm to the anterior margin.    I then irrigated the wound with saline. I infiltrated approximately 30 mL of 1% local between the  Incisions.  I placed 6 clips to mark biopsy cavity, at 12, 3, 6, and 9 o'clock. Two clips were placed on the pectoralis major.   I then closed all the wounds in layers using 3-0 Vicryl  sutures for the deep layer. At the skin, I closed the incisions with a 5-0 Monocryl suture. The incisions were then painted with Dermabond.  She had gauze place over the wounds and placed in a breast binder.   The patient tolerated the procedure well, was transported to the recovery room in good condition. Sponge and needle count were correct at the end of the case.   Final pathology is pending.   [name], MD, [facility] Pager: [phone] Office phone:  [phone]

## 2013-12-04 NOTE — Transfer of Care (Signed)
Immediate Anesthesia Transfer of Care Note  Patient: [name]  Procedure(s) Performed: Procedure(s): BREAST LUMPECTOMY WITH NEEDLE LOCALIZATION AND AXILLARY SENTINEL LYMPH NODE BX (Left)  Patient Location: PACU  Anesthesia Type:General  Level of Consciousness: awake and sedated  Airway & Oxygen Therapy: Patient Spontanous Breathing and Patient connected to face mask oxygen  Post-op Assessment: Report given to PACU RN and Post -op Vital signs reviewed and stable  Post vital signs: Reviewed and stable  Complications: No apparent anesthesia complications

## 2013-12-06 ENCOUNTER — Encounter (HOSPITAL_BASED_OUTPATIENT_CLINIC_OR_DEPARTMENT_OTHER): Payer: Self-pay | Admitting: Surgery

## 2013-12-11 ENCOUNTER — Telehealth: Payer: Self-pay | Admitting: *Deleted

## 2013-12-11 NOTE — Telephone Encounter (Signed)
Pt called to schedule f/u with Dr. Lucia Gaskins.  Informed pt that I would inform Dr. Pollie Friar nurse.  Confirmed Dr. [name] appt.  Pt relate she is doing well after surgery.  Denies further needs.  Contact information given.

## 2013-12-27 ENCOUNTER — Ambulatory Visit (HOSPITAL_BASED_OUTPATIENT_CLINIC_OR_DEPARTMENT_OTHER): Payer: Medicare PPO | Admitting: Oncology

## 2013-12-27 ENCOUNTER — Telehealth: Payer: Self-pay | Admitting: *Deleted

## 2013-12-27 VITALS — BP 171/72 | HR 66 | Temp 98.3°F | Resp 18 | Ht 61.0 in | Wt 159.2 lb

## 2013-12-27 DIAGNOSIS — C50919 Malignant neoplasm of unspecified site of unspecified female breast: Secondary | ICD-10-CM

## 2013-12-27 DIAGNOSIS — Z170 Estrogen receptor positive status [ER+]: Secondary | ICD-10-CM

## 2013-12-27 DIAGNOSIS — C50319 Malignant neoplasm of lower-inner quadrant of unspecified female breast: Secondary | ICD-10-CM

## 2013-12-27 MED ORDER — ANASTROZOLE 1 MG PO TABS: 1.0000 mg | ORAL_TABLET | Freq: Every day | ORAL | Status: DC

## 2013-12-27 NOTE — Telephone Encounter (Signed)
appts made and printed. . Pt is aware that Rehab will give her a call for her appts....td

## 2013-12-27 NOTE — Progress Notes (Signed)
[name]  Telephone:(336) [phone] Fax:(336) [phone]     ID: [name] OB: [birth year]  MR#: [id number]  CBJ#:[id number]  PCP: Felicia Saner., MD GYN:  [name] SU: [name] OTHER MD: [name]  CHIEF COMPLAINT: "I have breast cancer"  HISTORY OF PRESENT ILLNESS: The patient underwent routine screening mammography at [name] 10/09/2013 showing a 1 cm cluster of calcifications in the right breast at 5:00. There was also a 9 mm irregular density in the left breast laterally. Bilateral diagnostic mammography with tomography at [name] 11/01/2013 confirmed a 1 cm cluster of vascular calcifications at the 5:00 position of the right breast, which were felt to be stable. However in the left breast there was a new 1.1 cm mass with spiculated margins. This was at 1:00, 8 cm from the nipple. Ultrasound of the left breast the same day showed the mass to measure 6 mm and to be hypoechoic. The left axilla was unremarkable.  Biopsy of the left breast mass in question 11/06/2013 showed ([id number]) and invasive ductal carcinoma, grade 2, estrogen receptor 100% positive, progesterone receptor 95% positive, both with strong staining intensity, with an MIB-1 of 19% and no HER-2 amplification, the signals ratio being 0.95 and the number per cell 2.00. E-cadherin was positive.  The patient's subsequent history is as detailed below  INTERVAL HISTORY: [name] returns today for followup of her breast cancer. Since her last visit here the patient underwent left lumpectomy and sentinel lymph node sampling (on 12/04/2013, [id number]) showing a 1.2 cm invasive ductal carcinoma with lobular features, grade 1, repeat HER-2 again negative, margins ample, the closest being 1.5 cm. A single sentinel lymph node was negative.Her case was presented this morning at the multidisciplinary breast cancer conference. The consensus there was that she would not significantly benefit from  chemotherapy. It was felt also that if she could tolerate aromatase inhibitors well she would be able to ophthalmic of radiation.   REVIEW OF SYSTEMS: [name] did well with her surgery, with no fever, bleeding, or unusual pain. She says the worst part was getting rid of the tape. She does have chronic arthritis problems which make sure her "all over". This has been going on for years. It is not more frequent or intense than before, but we will have to clarify this baseline as she starts aromatase inhibitors. She has ringing in her years. Her dentures don't fit well. She has som but mostly when walking up stairs. She uses explain e hoarseness and a little bit of a dry cough which she frequently gets this time of year. She can be short of bed rest particularly when going upstairs. She uses a cane for walking. She has stress urinary incontinence. She feels a little forgetful at times. None of these problems are new. Otherwise a detailed review of systems today was noncontributory  PAST MEDICAL HISTORY: Past Medical History  Diagnosis Date  . Colon polyp   . Chronic rhinitis   . Morbid obesity   . DJD (degenerative Felicia disease)   . Hypertension   . Chronic renal insufficiency   . Memory loss   . Anemia   . GERD (gastroesophageal reflux disease)   . Wears glasses   . Full dentures     PAST SURGICAL HISTORY: Past Surgical History  Procedure Laterality Date  . Cataract extraction  2009    rt  . Vesicovaginal fistula closure w/ tah  1980  . Total knee arthroplasty  2002    rt  .  Total knee arthroplasty  2003    left  . Abdominal hysterectomy    . Tonsillectomy    . Knee arthroscopy      both  . Colonoscopy    . Breast lumpectomy with needle localization and axillary sentinel lymph node bx Left 12/04/2013    Procedure: BREAST LUMPECTOMY WITH NEEDLE LOCALIZATION AND AXILLARY SENTINEL LYMPH NODE BX;  Surgeon: Felicia Medal, MD;  Location: [facility];  Service: General;   Laterality: Left;   status post hysterectomy with bilateral sloping oophorectomy  FAMILY HISTORY Family History  Problem Relation Age of Onset  . Heart disease      remote family  . Lung cancer Brother     was a smoker  . Colon cancer Mother   . Colon cancer Maternal Aunt    the patient's mother died at the age of 2, with a history of colon cancer. The patient's father died at the age of 25 with "stomach cancer". The patient had 5 brothers, 3 sisters. One brother had lung cancer diagnosed at age 57. One maternal aunt had colon cancer at age 17. There is no history of breast or ovarian cancer in the family to her knowledge.  GYNECOLOGIC HISTORY:  Menarche age 44, first live birth age 73, the patient is [name]. She underwent hysterectomy with bilateral salpingo-oophorectomy approximately 34 years ago. She did not use hormone replacement. She did use birth control remotely, for about 15 years, with no complications.  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 She is widowed, lives alone  with no pets. Son [name] died from a myocardial infarction. Daughter [name] works in [name] as an Microbiologist (at [name]). The patient has 2 grandchildren and one great-grandchild. She is a Psychologist, forensic.    ADVANCED DIRECTIVES: In place; the patient's daughter [name] is her healthcare power of attorney. [name] can be reached at [phone] (cell), and [phone] (work).   HEALTH MAINTENANCE: History  Substance Use Topics  . Smoking status: Never Smoker   . Smokeless tobacco: Not on file  . Alcohol Use: No     Colonoscopy:  PAP:  Bone density: Mar 15 2013 at Dr. Derek Acosta, shows normal bone density with a positive T score  Lipid panel:  Allergies  Allergen Reactions  . Iron     REACTION: hives/whelps/bad constipation    Current Outpatient Prescriptions  Medication Sig Dispense Refill  . Acetaminophen  (TYLENOL ARTHRITIS PAIN PO) Take by mouth. Take as directed       . anastrozole (ARIMIDEX) 1 MG tablet Take 1 tablet (1 mg total) by mouth daily.  90 tablet  4  . aspirin 81 MG tablet Take 81 mg by mouth daily.        . Cholecalciferol (VITAMIN D3) 2000 UNITS TABS Take 1 tablet by mouth daily.       . famotidine (PEPCID) 20 MG tablet 20 mg at bedtime. One at bedtime      . fexofenadine (ALLEGRA) 180 MG tablet Take 180 mg by mouth daily as needed.        [name] glucosamine-chondroitin 500-400 MG tablet Take 1 tablet by mouth daily.       [name] HYDROcodone-acetaminophen (NORCO/VICODIN) 5-325 MG per tablet Take 1-2 tablets by mouth every 6 (six) hours as needed.  30 tablet  0  . losartan-hydrochlorothiazide (HYZAAR) 50-12.5 MG per tablet Take 1 tablet by mouth daily.      [name]  Multiple Vitamins-Minerals (CENTRUM SILVER PO) Take 1 tablet by mouth daily.        [name] omeprazole (PRILOSEC) 20 MG capsule Take 1 capsule (20 mg total) by mouth daily.  30 capsule  1  . Simethicone (GAS-X PO) Take by mouth. Take as directed        No current facility-administered medications for this visit.    OBJECTIVE: Older African American woman in no acute distress Filed Vitals:   12/27/13 1354  BP: 171/72  Pulse: 66  Temp: 98.3 F (36.8 C)  Resp: 18     Body mass index is 30.1 kg/(m^2).    ECOG FS:1 - Symptomatic but completely ambulatory  Ocular: Sclerae unicteric, bilateral arcus senilis Ear-nose-throat: Oropharynx clear and moist Lymphatic: No cervical or supraclavicular adenopathy Lungs no rales or rhonchi, fair excursion bilaterally Heart regular rate and rhythm, no murmur appreciated Abd soft, nontender, positive bowel sounds MSK no focal spinal tenderness, no upper extremity lymphedema Neuro: non-focal, well-oriented, appropriate affect Breasts: The right breast is unremarkable. The left breast is status post recent lumpectomy. The incision is healing nicely. There is no evidence of disease persistence or  recurrence.. The left axilla is benign.  LAB RESULTS:  CMP     Component Value Date/Time   NA 140 11/15/2013 0851   NA 135 04/26/2013 1104   K 3.5 11/15/2013 0851   K 3.5 04/26/2013 1104   CL 98 04/26/2013 1104   CO2 29 11/15/2013 0851   CO2 31 04/26/2013 1104   GLUCOSE 107 11/15/2013 0851   GLUCOSE 107* 04/26/2013 1104   BUN 23.0 11/15/2013 0851   BUN 19 04/26/2013 1104   CREATININE 1.2* 11/15/2013 0851   CREATININE 1.2 04/26/2013 1104   CALCIUM 9.6 11/15/2013 0851   CALCIUM 9.4 04/26/2013 1104   PROT 7.2 11/15/2013 0851   PROT 7.6 10/13/2012 1046   ALBUMIN 3.6 11/15/2013 0851   ALBUMIN 3.9 10/13/2012 1046   AST 24 11/15/2013 0851   AST 29 10/13/2012 1046   ALT 24 11/15/2013 0851   ALT 26 10/13/2012 1046   ALKPHOS 61 11/15/2013 0851   ALKPHOS 62 10/13/2012 1046   BILITOT 0.43 11/15/2013 0851   BILITOT 0.6 10/13/2012 1046   GFRNONAA 42.15* 10/24/2010 1001   GFRAA 48 10/30/2008 0928    I No results found for this basename: SPEP,  UPEP,   kappa and lambda light chains    Lab Results  Component Value Date   WBC 5.3 11/15/2013   NEUTROABS 2.7 11/15/2013   HGB 12.1 12/04/2013   HCT 34.3* 11/15/2013   MCV 95.3 11/15/2013   PLT 221 11/15/2013      Chemistry      Component Value Date/Time   NA 140 11/15/2013 0851   NA 135 04/26/2013 1104   K 3.5 11/15/2013 0851   K 3.5 04/26/2013 1104   CL 98 04/26/2013 1104   CO2 29 11/15/2013 0851   CO2 31 04/26/2013 1104   BUN 23.0 11/15/2013 0851   BUN 19 04/26/2013 1104   CREATININE 1.2* 11/15/2013 0851   CREATININE 1.2 04/26/2013 1104      Component Value Date/Time   CALCIUM 9.6 11/15/2013 0851   CALCIUM 9.4 04/26/2013 1104   ALKPHOS 61 11/15/2013 0851   ALKPHOS 62 10/13/2012 1046   AST 24 11/15/2013 0851   AST 29 10/13/2012 1046   ALT 24 11/15/2013 0851   ALT 26 10/13/2012 1046   BILITOT 0.43 11/15/2013 0851   BILITOT 0.6 10/13/2012 1046  No results found for this basename: LABCA2    No components found with this basename: [id number]    No results found  for this basename: INR,  in the last 168 hours  Urinalysis    Component Value Date/Time   COLORURINE LT. YELLOW 08/24/2011 [place] 08/24/2011 1033   LABSPEC 1.010 08/24/2011 1033   PHURINE 6.0 08/24/2011 1033   HGBUR TRACE-INTACT 08/24/2011 1033   [place] 08/24/2011 1033   KETONESUR NEGATIVE 08/24/2011 1033   UROBILINOGEN 0.2 08/24/2011 1033   NITRITE NEGATIVE 08/24/2011 1033   [place] 08/24/2011 1033    STUDIES: Nm Sentinel Node Inj-no Rpt (breast)  12/04/2013   CLINICAL DATA: Left breast cancer   Sulfur colloid was injected intradermally by the nuclear medicine  technologist for breast cancer sentinel node localization.      ASSESSMENT: 78 y.o. [place] woman status post left breast biopsy 11/06/2013 for a clinical T1n N0, stage IA invasive ductal carcinoma, grade 2, estrogen receptor 100% positive, progesterone receptor 95% positive, with an MIB-1 of 19% and no HER-2 amplification  (1) status post left lumpectomy and sentinel lymph node sampling 12/04/2013 for a pT1c pN0, stage IA invasive ductal carcinoma, grade 1, repeat HER-2 again negative, with ample margins  (2) anastrozole started March 2015; bone density may 2014 was normal  PLAN: We spent approximately 40 minutes today going over her situation. She understands her prognosis is very favorable. She would so minimally benefit from chemotherapy that that is not recommended. On the other hand we do recommend anti-estrogens, which not only would cut in half her already low risk of recurrence, but would also cut in half her risk of developing a new breast cancer. Furthermore, if she was able to tolerate aromatase inhibitors, we would feel comfortable with her not needing chemotherapy (studies showing patients like her there is no survival advantage assuming they are able to take 5 years of anti-estrogens).  We then discussed the possible toxicities side effects and complications of  anastrozole. I have sent a prescription in for her and she will start that as soon as she receives the drug. She will see [place] again in 3 months. If she is tolerating it well the plan will be to continue anastrozole for 5 years. Otherwise she will be referred to radiation therapy (Dr. Pablo Ledger).  I gave the patient a copy of the [employer] program pamphlet and encouraged her to participate in it. I also made her a referral to our lymphedema clinic.  She has a good understanding of the Acosta plan. She agrees with that. She knows the goal of treatment is cure. She will see [place] again in 3 months.   Chauncey Cruel, MD   12/27/2013 2:21 PM

## 2013-12-28 ENCOUNTER — Ambulatory Visit (INDEPENDENT_AMBULATORY_CARE_PROVIDER_SITE_OTHER): Payer: Medicare PPO | Admitting: Surgery

## 2013-12-28 ENCOUNTER — Encounter (INDEPENDENT_AMBULATORY_CARE_PROVIDER_SITE_OTHER): Payer: Self-pay | Admitting: Surgery

## 2013-12-28 VITALS — BP 140/82 | HR 72 | Temp 97.7°F | Resp 16 | Ht 63.0 in | Wt 157.2 lb

## 2013-12-28 DIAGNOSIS — C50419 Malignant neoplasm of upper-outer quadrant of unspecified female breast: Secondary | ICD-10-CM

## 2013-12-28 DIAGNOSIS — C50412 Malignant neoplasm of upper-outer quadrant of left female breast: Secondary | ICD-10-CM
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
## 2014-01-04 ENCOUNTER — Encounter: Payer: Self-pay | Admitting: Oncology

## 2014-01-04 NOTE — Progress Notes (Signed)
Patient had called and left a message and I called her back. She has a cancer policy and needs bills. I advised I could get for [place]. All other drs if they need, she will need to call them direct. All put in mail for [facility] today.

## 2014-01-26 ENCOUNTER — Telehealth: Payer: Self-pay | Admitting: *Deleted

## 2014-01-26 NOTE — Telephone Encounter (Signed)
Mailed after appt letter to pt. 

## 2014-02-20 ENCOUNTER — Other Ambulatory Visit (INDEPENDENT_AMBULATORY_CARE_PROVIDER_SITE_OTHER): Payer: Commercial Managed Care - HMO

## 2014-02-20 ENCOUNTER — Encounter (INDEPENDENT_AMBULATORY_CARE_PROVIDER_SITE_OTHER): Payer: Self-pay

## 2014-02-20 ENCOUNTER — Ambulatory Visit (INDEPENDENT_AMBULATORY_CARE_PROVIDER_SITE_OTHER): Payer: Commercial Managed Care - HMO | Admitting: Internal Medicine

## 2014-02-20 ENCOUNTER — Telehealth: Payer: Self-pay | Admitting: Internal Medicine

## 2014-02-20 ENCOUNTER — Encounter: Payer: Self-pay | Admitting: Internal Medicine

## 2014-02-20 VITALS — BP 134/80 | HR 73 | Temp 98.3°F | Ht 62.0 in | Wt 160.5 lb

## 2014-02-20 DIAGNOSIS — Z23 Encounter for immunization: Secondary | ICD-10-CM

## 2014-02-20 DIAGNOSIS — Z0000 Encounter for general adult medical examination without abnormal findings: Secondary | ICD-10-CM

## 2014-02-20 DIAGNOSIS — Z0001 Encounter for general adult medical examination with abnormal findings: Secondary | ICD-10-CM | POA: Insufficient documentation

## 2014-02-20 LAB — BASIC METABOLIC PANEL
BUN: 30 mg/dL — ABNORMAL HIGH (ref 6–23)
CO2: 29 mEq/L (ref 19–32)
Calcium: 9.9 mg/dL (ref 8.4–10.5)
Chloride: 99 mEq/L (ref 96–112)
Creatinine, Ser: 1.2 mg/dL (ref 0.4–1.2)
GFR: 56.13 mL/min — ABNORMAL LOW (ref >60.00)
Glucose, Bld: 73 mg/dL (ref 70–99)
Potassium: 3.9 mEq/L (ref 3.5–5.1)
Sodium: 136 mEq/L (ref 135–145)

## 2014-02-20 LAB — URINALYSIS, ROUTINE W REFLEX MICROSCOPIC
Bilirubin Urine: NEGATIVE
Hgb urine dipstick: NEGATIVE
Ketones, ur: NEGATIVE
Nitrite: NEGATIVE
RBC / HPF: NONE SEEN (ref 0–?)
Specific Gravity, Urine: 1.01 (ref 1.000–1.030)
Total Protein, Urine: NEGATIVE
Urine Glucose: NEGATIVE
Urobilinogen, UA: 0.2 (ref 0.0–1.0)
pH: 7.5 (ref 5.0–8.0)

## 2014-02-20 LAB — CBC WITH DIFFERENTIAL/PLATELET
Basophils Absolute: 0 10*3/uL (ref 0.0–0.1)
Basophils Relative: 0.8 % (ref 0.0–3.0)
Eosinophils Absolute: 0.2 10*3/uL (ref 0.0–0.7)
Eosinophils Relative: 2.8 % (ref 0.0–5.0)
HCT: 34.7 % — ABNORMAL LOW (ref 36.0–46.0)
Hemoglobin: 11.6 g/dL — ABNORMAL LOW (ref 12.0–15.0)
Lymphocytes Relative: 31.8 % (ref 12.0–46.0)
Lymphs Abs: 1.9 10*3/uL (ref 0.7–4.0)
MCHC: 33.6 g/dL (ref 30.0–36.0)
MCV: 95.4 fl (ref 78.0–100.0)
Monocytes Absolute: 0.5 10*3/uL (ref 0.1–1.0)
Monocytes Relative: 8.7 % (ref 3.0–12.0)
Neutro Abs: 3.3 10*3/uL (ref 1.4–7.7)
Neutrophils Relative %: 55.9 % (ref 43.0–77.0)
Platelets: 255 10*3/uL (ref 150.0–400.0)
RBC: 3.64 Mil/uL — ABNORMAL LOW (ref 3.87–5.11)
RDW: 13.2 % (ref 11.5–14.6)
WBC: 5.9 10*3/uL (ref 4.5–10.5)

## 2014-02-20 LAB — LIPID PANEL
Cholesterol: 177 mg/dL (ref 0–200)
HDL: 59.8 mg/dL (ref >39.00)
LDL Cholesterol: 97 mg/dL (ref 0–99)
Total CHOL/HDL Ratio: 3
Triglycerides: 101 mg/dL (ref 0.0–149.0)
VLDL: 20.2 mg/dL (ref 0.0–40.0)

## 2014-02-20 LAB — TSH: TSH: 2.47 u[IU]/mL (ref 0.35–5.50)

## 2014-02-20 LAB — HEPATIC FUNCTION PANEL
ALT: 30 U/L (ref 0–35)
AST: 28 U/L (ref 0–37)
Albumin: 4 g/dL (ref 3.5–5.2)
Alkaline Phosphatase: 58 U/L (ref 39–117)
Bilirubin, Direct: 0 mg/dL (ref 0.0–0.3)
Total Bilirubin: 0.5 mg/dL (ref 0.3–1.2)
Total Protein: 7.3 g/dL (ref 6.0–8.3)

## 2014-02-20 MED ORDER — TRAMADOL HCL 50 MG PO TABS: 50.0000 mg | ORAL_TABLET | Freq: Four times a day (QID) | ORAL | Status: DC | PRN

## 2014-02-20 NOTE — Telephone Encounter (Signed)
Done hardcopy to robin  

## 2014-02-20 NOTE — Patient Instructions (Signed)
You had the new Prevnar pneumonia shot  Please continue all other medications as before, and refills have been done if requested. Please have the pharmacy call with any other refills you may need.  Please continue your efforts at being more active, low cholesterol diet, and weight control.  You are otherwise up to date with prevention measures today.  Please keep your appointments with your specialists as you may have planned  Please go to the LAB in the Basement (turn left off the elevator) for the tests to be done today  You will be contacted by phone if any changes need to be made immediately.  Otherwise, you will receive a letter about your results with an explanation, but please check with MyChart first.  Please return in 1 year for your yearly visit, or sooner if needed, with Lab testing done 3-5 days before

## 2014-02-20 NOTE — Telephone Encounter (Signed)
Message copied by Biagio Borg on Tue Feb 20, 2014  1:24 PM ------      Message from: Sharon Seller B      Created: Tue Feb 20, 2014 10:55 AM       The patient did want a refill of tramadol sent to [facility].  She did think this was discussed at her OV. ------

## 2014-02-20 NOTE — Progress Notes (Signed)
Pre visit review using our clinic review tool, if applicable. No additional management support is needed unless otherwise documented below in the visit note. 

## 2014-02-20 NOTE — Assessment & Plan Note (Signed)

## 2014-02-20 NOTE — Telephone Encounter (Signed)
Faxed hardcopy to [facility] for Tramadol #120 with 2 refills.

## 2014-02-20 NOTE — Addendum Note (Signed)
Addended by: Sharon Seller B on: 02/20/2014 10:55 AM   Modules accepted: Orders

## 2014-02-20 NOTE — Progress Notes (Signed)
Subjective:    Patient ID: [name], female    DOB: [birth year], 78 y.o.   MRN: [id number]  HPI  Here for wellness and to establish as new pt;  Overall doing ok;  Pt denies CP, worsening SOB, DOE, wheezing, orthopnea, PND, worsening LE edema, palpitations, dizziness or syncope.  Pt denies neurological change such as new headache, facial or extremity weakness.  Pt denies polydipsia, polyuria, or low sugar symptoms. Pt states overall good compliance with treatment and medications, good tolerability, and has been trying to follow lower cholesterol diet.  Pt denies worsening depressive symptoms, suicidal ideation or panic. No fever, night sweats, wt loss, loss of appetite, or other constitutional symptoms.  Pt states good ability with ADL's, has low fall risk, home safety reviewed and adequate, no other significant changes in hearing or vision, and only occasionally active with exercise.  Son died at 79yo with heart disease, depression, hx of drug use. Does have several wks ongoing nasal allergy symptoms with clearish congestion, itch and sneezing, without fever, pain, ST, cough, swelling or wheezing. Has ongoing knee pain, needs tramadol prn refill  Past Medical History  Diagnosis Date  . Colon polyp   . Chronic rhinitis   . Morbid obesity   . DJD (degenerative joint disease)   . Hypertension   . Chronic renal insufficiency   . Memory loss   . Anemia   . GERD (gastroesophageal reflux disease)   . Wears glasses   . Full dentures    Past Surgical History  Procedure Laterality Date  . Cataract extraction  2009    rt  . Vesicovaginal fistula closure w/ tah  1980  . Total knee arthroplasty  2002    rt  . Total knee arthroplasty  2003    left  . Abdominal hysterectomy    . Tonsillectomy    . Knee arthroscopy      both  . Colonoscopy    . Breast lumpectomy with needle localization and axillary sentinel lymph node bx Left 12/04/2013    Procedure: BREAST LUMPECTOMY WITH NEEDLE  LOCALIZATION AND AXILLARY SENTINEL LYMPH NODE BX;  Surgeon: Shann Medal, MD;  Location: [facility];  Service: General;  Laterality: Left;    reports that she has never smoked. She does not have any smokeless tobacco history on file. She reports that she does not drink alcohol or use illicit drugs. family history includes Colon cancer in her maternal aunt and mother; Heart disease in an other family member; Lung cancer in her brother. Allergies  Allergen Reactions  . Iron     REACTION: hives/whelps/bad constipation   Current Outpatient Prescriptions on File Prior to Visit  Medication Sig Dispense Refill  . Acetaminophen (TYLENOL ARTHRITIS PAIN PO) Take by mouth. Take as directed       . anastrozole (ARIMIDEX) 1 MG tablet Take 1 tablet (1 mg total) by mouth daily.  90 tablet  4  . aspirin 81 MG tablet Take 81 mg by mouth daily.        . Cholecalciferol (VITAMIN D3) 2000 UNITS TABS Take 1 tablet by mouth daily.       . fexofenadine (ALLEGRA) 180 MG tablet Take 180 mg by mouth daily as needed.        [name] glucosamine-chondroitin 500-400 MG tablet Take 1 tablet by mouth daily.       [name] losartan-hydrochlorothiazide (HYZAAR) 50-12.5 MG per tablet Take 1 tablet by mouth daily.      [name]  Multiple Vitamins-Minerals (CENTRUM SILVER PO) Take 1 tablet by mouth daily.        . Simethicone (GAS-X PO) Take by mouth. Take as directed       . famotidine (PEPCID) 20 MG tablet 20 mg at bedtime. One at bedtime      . omeprazole (PRILOSEC) 20 MG capsule Take 1 capsule (20 mg total) by mouth daily.  30 capsule  1   No current facility-administered medications on file prior to visit.    Review of Systems Constitutional: Negative for increased diaphoresis, other activity, appetite or other siginficant weight change  HENT: Negative for worsening hearing loss, ear pain, facial swelling, mouth sores and neck stiffness.   Eyes: Negative for other worsening pain, redness or visual disturbance.    Respiratory: Negative for shortness of breath and wheezing.   Cardiovascular: Negative for chest pain and palpitations.  Gastrointestinal: Negative for diarrhea, blood in stool, abdominal distention or other pain Genitourinary: Negative for hematuria, flank pain or change in urine volume.  Musculoskeletal: Negative for myalgias or other joint complaints.  Skin: Negative for color change and wound.  Neurological: Negative for syncope and numbness. other than noted Hematological: Negative for adenopathy. or other swelling Psychiatric/Behavioral: Negative for hallucinations, self-injury, decreased concentration or other worsening agitation.      Objective:   Physical Exam BP 134/80  Pulse 73  Temp(Src) 98.3 F (36.8 C) (Oral)  Ht 5\' 2"  (1.575 m)  Wt 160 lb 8 oz (72.802 kg)  BMI 29.35 kg/m2  SpO2 92% VS noted,  Constitutional: Pt is oriented to person, place, and time. Appears well-developed and well-nourished.  Head: Normocephalic and atraumatic.  Right Ear: External ear normal.  Left Ear: External ear normal.  Nose: Nose normal.  Mouth/Throat: Oropharynx is clear and moist.  Eyes: Conjunctivae and EOM are normal. Pupils are equal, round, and reactive to light.  Neck: Normal range of motion. Neck supple. No JVD present. No tracheal deviation present.  Cardiovascular: Normal rate, regular rhythm, normal heart sounds and intact distal pulses.   Pulmonary/Chest: Effort normal and breath sounds without rales or wheezing  Abdominal: Soft. Bowel sounds are normal. NT. No HSM  Musculoskeletal: Normal range of motion. Exhibits no edema.  Lymphadenopathy:  Has no cervical adenopathy.  Neurological: Pt is alert and oriented to person, place, and time. Pt has normal reflexes. No cranial nerve deficit except for mild hearing loss. . Motor grossly intact Skin: Skin is warm and dry. No rash noted.  Psychiatric:  Has normal mood and affect. Behavior is normal.     Assessment & Plan:

## 2014-02-21 ENCOUNTER — Encounter: Payer: Self-pay | Admitting: Internal Medicine

## 2014-03-28 ENCOUNTER — Encounter: Payer: Self-pay | Admitting: Physician Assistant

## 2014-03-28 ENCOUNTER — Telehealth: Payer: Self-pay | Admitting: Physician Assistant

## 2014-03-28 ENCOUNTER — Ambulatory Visit (HOSPITAL_BASED_OUTPATIENT_CLINIC_OR_DEPARTMENT_OTHER): Payer: Commercial Managed Care - HMO | Admitting: Physician Assistant

## 2014-03-28 ENCOUNTER — Other Ambulatory Visit (HOSPITAL_BASED_OUTPATIENT_CLINIC_OR_DEPARTMENT_OTHER): Payer: Commercial Managed Care - HMO

## 2014-03-28 VITALS — BP 186/85 | HR 65 | Temp 98.6°F | Resp 18 | Ht 62.0 in | Wt 160.0 lb

## 2014-03-28 DIAGNOSIS — C50919 Malignant neoplasm of unspecified site of unspecified female breast: Secondary | ICD-10-CM

## 2014-03-28 DIAGNOSIS — C50412 Malignant neoplasm of upper-outer quadrant of left female breast: Secondary | ICD-10-CM

## 2014-03-28 DIAGNOSIS — N189 Chronic kidney disease, unspecified: Secondary | ICD-10-CM

## 2014-03-28 DIAGNOSIS — C50419 Malignant neoplasm of upper-outer quadrant of unspecified female breast: Secondary | ICD-10-CM

## 2014-03-28 DIAGNOSIS — Z853 Personal history of malignant neoplasm of breast: Secondary | ICD-10-CM

## 2014-03-28 DIAGNOSIS — D649 Anemia, unspecified: Secondary | ICD-10-CM

## 2014-03-28 DIAGNOSIS — I10 Essential (primary) hypertension: Secondary | ICD-10-CM

## 2014-03-28 DIAGNOSIS — Z170 Estrogen receptor positive status [ER+]: Secondary | ICD-10-CM

## 2014-03-28 LAB — CBC WITH DIFFERENTIAL/PLATELET
BASO%: 1.5 % (ref 0.0–2.0)
Basophils Absolute: 0.1 10*3/uL (ref 0.0–0.1)
EOS%: 3.1 % (ref 0.0–7.0)
Eosinophils Absolute: 0.2 10*3/uL (ref 0.0–0.5)
HCT: 33.1 % — ABNORMAL LOW (ref 34.8–46.6)
HGB: 10.9 g/dL — ABNORMAL LOW (ref 11.6–15.9)
LYMPH%: 32.2 % (ref 14.0–49.7)
MCH: 31.4 pg (ref 25.1–34.0)
MCHC: 33.1 g/dL (ref 31.5–36.0)
MCV: 95.1 fL (ref 79.5–101.0)
MONO#: 0.6 10*3/uL (ref 0.1–0.9)
MONO%: 10.1 % (ref 0.0–14.0)
NEUT#: 3 10*3/uL (ref 1.5–6.5)
NEUT%: 53.1 % (ref 38.4–76.8)
Platelets: 249 10*3/uL (ref 145–400)
RBC: 3.48 10*6/uL — ABNORMAL LOW (ref 3.70–5.45)
RDW: 13.3 % (ref 11.2–14.5)
WBC: 5.6 10*3/uL (ref 3.9–10.3)
lymph#: 1.8 10*3/uL (ref 0.9–3.3)

## 2014-03-28 LAB — COMPREHENSIVE METABOLIC PANEL (CC13)
ALT: 22 U/L (ref 0–55)
AST: 26 U/L (ref 5–34)
Albumin: 3.7 g/dL (ref 3.5–5.0)
Alkaline Phosphatase: 63 U/L (ref 40–150)
Anion Gap: 14 mEq/L — ABNORMAL HIGH (ref 3–11)
BUN: 24 mg/dL (ref 7.0–26.0)
CO2: 22 mEq/L (ref 22–29)
Calcium: 9.4 mg/dL (ref 8.4–10.4)
Chloride: 99 mEq/L (ref 98–109)
Creatinine: 1.3 mg/dL — ABNORMAL HIGH (ref 0.6–1.1)
Glucose: 144 mg/dl — ABNORMAL HIGH (ref 70–140)
Potassium: 4.2 mEq/L (ref 3.5–5.1)
Sodium: 135 mEq/L — ABNORMAL LOW (ref 136–145)
Total Bilirubin: 0.39 mg/dL (ref 0.20–1.20)
Total Protein: 7.1 g/dL (ref 6.4–8.3)

## 2014-03-28 NOTE — Progress Notes (Signed)
[name]  [phone] [phone]     ID: [name] OB: [birth year]  MR#: [id number]  CSN#:[id number]  PCP: Felicia Cower, MD GYN:  Felicia Nigh, MD SU: Felicia Overall, MD OTHER MD: Felicia Silversmith, MD; Felicia Gully, MD; Felicia Shorts, MD  CHIEF COMPLAINT:  Hx of Left Breast Cancer (anastrazole)   HISTORY OF PRESENT ILLNESS: The patient underwent routine screening mammography at [facility] 10/09/2013 showing a 1 cm cluster of calcifications in the right breast at 5:00. There was also a 9 mm irregular density in the left breast laterally. Bilateral diagnostic mammography with tomography at [facility] 11/01/2013 confirmed a 1 cm cluster of vascular calcifications at the 5:00 position of the right breast, which were felt to be stable. However in the left breast there was a new 1.1 cm mass with spiculated margins. This was at 1:00, 8 cm from the nipple. Ultrasound of the left breast the same day showed the mass to measure 6 mm and to be hypoechoic. The left axilla was unremarkable.  Biopsy of the left breast mass in question 11/06/2013 showed ([id number]) and invasive ductal carcinoma, grade 2, estrogen receptor 100% positive, progesterone receptor 95% positive, both with strong staining intensity, with an MIB-1 of 19% and no HER-2 amplification, the signals ratio being 0.95 and the number per cell 2.00. E-cadherin was positive.  The patient's subsequent history is as detailed below  INTERVAL HISTORY: [name] returns alone today for followup of her left breast cancer. Following her appointment here in early March, she started on anastrozole which is tolerating very well. She has occasional hot flashes, sometimes during the day, and sometimes at night. She does not find these particularly problematic. She has some general arthritis, but does not feel that this has worsened at all since starting the anastrozole. She's had no problems with vaginal dryness and also denies  any abnormal vaginal bleeding.  In fact, Acosta the interval history is unremarkable. She is hoping to get started participating in the [place] program today, and in fact brought her "release"  forms with her today to be signed.   REVIEW OF SYSTEMS: Temperance denies any recent illnesses. She's had no recent fevers, chills, or night sweats. Her energy level is fairly good. She's had no new rashes or skin changes and denies abnormal bruising or bleeding. She and drinks well, keeping herself well hydrated. She has some problems with reflux for which she takes omeprazole and famotidine as needed. She denies any problems with nausea and has had no change in bowel or bladder habits. She has "allergy problems" with sinus congestion and some postnasal drip. She tells me her voice is hoarse, and associates that with her allergies. She denies any sore throat and has had no problems swallowing. She denies any cough, phlegm production, increased shortness of breath, increased peripheral swelling, chest pain, or palpitations. She's had no recent headaches and denies any dizziness or change in vision. She has an old injury to the left foot which occasionally causes some discomfort with activity, but denies any new or unusual myalgias, arthralgias, or bony pain.    a detailed review of systems is otherwise stable and noncontributory.     PAST MEDICAL HISTORY: Past Medical History  Diagnosis Date  . Colon polyp   . Chronic rhinitis   . Morbid obesity   . DJD (degenerative joint disease)   . Hypertension   . Chronic renal insufficiency   . Memory loss   . Anemia   .  GERD (gastroesophageal reflux disease)   . Wears glasses   . Full dentures     PAST SURGICAL HISTORY: Past Surgical History  Procedure Laterality Date  . Cataract extraction  2009    rt  . Vesicovaginal fistula closure w/ tah  1980  . Total knee arthroplasty  2002    rt  . Total knee arthroplasty  2003    left  . Abdominal  hysterectomy    . Tonsillectomy    . Knee arthroscopy      both  . Colonoscopy    . Breast lumpectomy with needle localization and axillary sentinel lymph node bx Left 12/04/2013    Procedure: BREAST LUMPECTOMY WITH NEEDLE LOCALIZATION AND AXILLARY SENTINEL LYMPH NODE BX;  Surgeon: Shann Medal, MD;  Location: [place];  Service: General;  Laterality: Left;   status post hysterectomy with bilateral sloping oophorectomy  FAMILY HISTORY Family History  Problem Relation Age of Onset  . Heart disease      remote family  . Lung cancer Brother     was a smoker  . Colon cancer Mother   . Colon cancer Maternal Aunt    the patient's mother died at the age of 39, with a history of colon cancer. The patient's father died at the age of 43 with "stomach cancer". The patient had 5 brothers, 3 sisters. One brother had lung cancer diagnosed at age 29. One maternal aunt had colon cancer at age 46. There is no history of breast or ovarian cancer in the family to her knowledge.  GYNECOLOGIC HISTORY:   (Reviewed 03/28/2014) Menarche age 89, first live birth age 69, the patient is [place] P2. She underwent hysterectomy with bilateral salpingo-oophorectomy approximately 34 years ago. She did not use hormone replacement. She did use birth control remotely, for about 15 years, with no complications.  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 She is widowed, lives alone with no pets. Son [name] died from a myocardial infarction at the age of 22. Daughter [name] works in [place] as an Microbiologist (at [employer]). The patient has 2 grandchildren and one great-grandchild. She is a Psychologist, forensic.    ADVANCED DIRECTIVES: In place; the patient's daughter [name] is her healthcare power of attorney. [name] can be reached at [phone] (cell), and [phone] (work).   HEALTH MAINTENANCE:  (Updated  03/28/2014) History  Substance Use Topics  . Smoking status: Never Smoker   . Smokeless tobacco: Never Used  . Alcohol Use: No    Colonoscopy:  Oct 2011/DrHenrene Pastor  PAP: s/p hysterectomy   Bone density: 03/15/2013 at [facility] [place], shows normal bone density  Lipid panel:  April 2015/Dr. John   Allergies  Allergen Reactions  . Iron     REACTION: hives/whelps/bad constipation    Current Outpatient Prescriptions  Medication Sig Dispense Refill  . Acetaminophen (TYLENOL ARTHRITIS PAIN PO) Take by mouth. Take as directed       . anastrozole (ARIMIDEX) 1 MG tablet Take 1 tablet (1 mg total) by mouth daily.  90 tablet  4  . aspirin 81 MG tablet Take 81 mg by mouth daily.        . Cholecalciferol (VITAMIN D3) 2000 UNITS TABS Take 1 tablet by mouth daily.       . famotidine (PEPCID) 20 MG tablet 20 mg at bedtime. One at bedtime      .  fexofenadine (ALLEGRA) 180 MG tablet Take 180 mg by mouth daily as needed.        [name] losartan-hydrochlorothiazide (HYZAAR) 50-12.5 MG per tablet Take 1 tablet by mouth daily.      . Multiple Vitamins-Minerals (CENTRUM SILVER PO) Take 1 tablet by mouth daily.        [name] omeprazole (PRILOSEC) 20 MG capsule Take 20 mg by mouth daily.      . Simethicone (GAS-X PO) Take by mouth. Take as directed       . traMADol (ULTRAM) 50 MG tablet Take 1 tablet (50 mg total) by mouth every 6 (six) hours as needed.  120 tablet  2  . glucosamine-chondroitin 500-400 MG tablet Take 1 tablet by mouth daily.        No current facility-administered medications for this visit.    OBJECTIVE: Older African American woman appears comfortable  in no acute distress Filed Vitals:   03/28/14 1124  BP: 186/85  Pulse: 65  Temp: 98.6 F (37 C)  Resp: 18     Body mass index is 29.26 kg/(m^2).    ECOG FS: 0 Filed Weights   03/28/14 1124  Weight: 160 lb (72.576 kg)   Physical Exam: HEENT:  Sclerae anicteric. Conjunctiva pink. Oropharynx clear, pink, and moist.  No  oral lesions or ulcerations. Neck supple, trachea midline. NODES:  No cervical or supraclavicular lymphadenopathy palpated.  BREAST EXAM:  Right breast is unremarkable.  Left breast is status post lumpectomy. No suspicious nodularities or skin changes. Mild scar tissue palpated beneath the incision line. No evidence of local recurrence. Axillae are benign bilaterally, with no palpable lymphadenopathy. LUNGS:  Clear to auscultation bilaterally.  No wheezes or rhonchi HEART:  Regular rate and rhythm. No murmur appreciated.  ABDOMEN:  Soft, nontender.  No organomegaly or masses palpated. Positive bowel sounds.  MSK:  No focal spinal tenderness to palpation. Good range of motion bilaterally in the upper extremities. EXTREMITIES:  No peripheral edema.  No lymphedema in the left upper extremity. SKIN:  No visible rashes. No excessive ecchymoses. No petechiae. Good skin turgor. NEURO:  Nonfocal. Well oriented.  Appropriate affect.    LAB RESULTS:   Lab Results  Component Value Date   WBC 5.6 03/28/2014   NEUTROABS 3.0 03/28/2014   HGB 10.9* 03/28/2014   HCT 33.1* 03/28/2014   MCV 95.1 03/28/2014   PLT 249 03/28/2014      Chemistry      Component Value Date/Time   NA 135* 03/28/2014 1112   NA 136 02/20/2014 1101   K 4.2 03/28/2014 1112   K 3.9 02/20/2014 1101   CL 99 02/20/2014 1101   CO2 22 03/28/2014 1112   CO2 29 02/20/2014 1101   BUN 24.0 03/28/2014 1112   BUN 30* 02/20/2014 1101   CREATININE 1.3* 03/28/2014 1112   CREATININE 1.2 02/20/2014 1101      Component Value Date/Time   CALCIUM 9.4 03/28/2014 1112   CALCIUM 9.9 02/20/2014 1101   ALKPHOS 63 03/28/2014 1112   ALKPHOS 58 02/20/2014 1101   AST 26 03/28/2014 1112   AST 28 02/20/2014 1101   ALT 22 03/28/2014 1112   ALT 30 02/20/2014 1101   BILITOT 0.39 03/28/2014 1112   BILITOT 0.5 02/20/2014 1101       STUDIES:  Most recent bone density at [facility] on 03/15/2013 was normal.   Next bilateral diagnostic mammogram will be due at  [facility] in December 2015.     ASSESSMENT: 78  y.o. [place] woman status post left breast biopsy 11/06/2013 for a clinical T1n N0, stage IA invasive ductal carcinoma, grade 2, estrogen receptor 100% positive, progesterone receptor 95% positive, with an MIB-1 of 19% and no HER-2 amplification  (1) status post left lumpectomy and sentinel lymph node sampling 12/04/2013 for a pT1c pN0, stage IA invasive ductal carcinoma, grade 1, repeat HER-2 again negative, with ample margins  (2) anastrozole started March 2015, the plan being to continue for total of 5 years until March 2020; bone density May 2014 was normal  (3) foregoing radiation therapy since she is tolerating the aromatase inhibitor.    PLAN: Charmain appears to be doing quite well, and there is no clinical evidence of disease recurrence at this time. She's tolerating the anastrozole well, and will continue with her current regimen. Our plan will be to continue the anastrozole for a total of 5 years, until the spring of 2020.  She will be due for her next annual mammogram in December, and we'll see Dr. Jana Hakim in late December or early January for her routine labs and physical exam. Of course as noted in Dr. Jana Hakim previous office note, since she is tolerating the antiestrogen therapy well, she will not need radiation therapy.  She does plan on participating in the [facility] program and I encouraged her to do so.  We will also continue to follow her bone density, and that is due to be repeated next year in May 2016.  The above plan was reviewed in detail with the patient today. She voices both her understanding and agreement with our plan, and will call any changes or problems. In the meanwhile, she'll continue to be followed by her primary care physician as well, Dr. Cathlean Acosta, and I will forward him a copy of today's office note as well as her current labs Both the mild anemia and the elevated serum creatinine appear to be chronic  and stable.   Theotis Burrow, PA-C   03/28/2014 1:19 PM

## 2014-03-28 NOTE — Telephone Encounter (Signed)
per pof to sch appt/mamma @ [facility]-Nov 2015/pt aware of time & date-gave pt copy of sch

## 2014-04-26 ENCOUNTER — Other Ambulatory Visit: Payer: Self-pay

## 2014-04-26 MED ORDER — LOSARTAN POTASSIUM-HCTZ 50-12.5 MG PO TABS: 1.0000 | ORAL_TABLET | Freq: Every day | ORAL | Status: DC

## 2014-05-07 ENCOUNTER — Telehealth: Payer: Self-pay | Admitting: Internal Medicine

## 2014-05-07 MED ORDER — LOSARTAN POTASSIUM-HCTZ 50-12.5 MG PO TABS: 1.0000 | ORAL_TABLET | Freq: Every day | ORAL | Status: DC

## 2014-05-07 NOTE — Telephone Encounter (Signed)
For Future note.  Patient will need 3 month supply of tramadol in order to avoid extra charges with pharmacy.   Patient is also requesting refill of hydrocholorothiazide 35mg  sent to [facility].

## 2014-05-07 NOTE — Telephone Encounter (Signed)
BP medication has been refilled. Will forward note for a 3 month supply request for Tramadol to [facility].

## 2014-05-08 ENCOUNTER — Telehealth: Payer: Self-pay | Admitting: Internal Medicine

## 2014-05-08 MED ORDER — TRAMADOL HCL 50 MG PO TABS: 50.0000 mg | ORAL_TABLET | Freq: Two times a day (BID) | ORAL | Status: DC

## 2014-05-08 NOTE — Telephone Encounter (Signed)
Very sorry,  But I cannot do this due to the large number of controlled substance pills, I do not feel comfortable with this

## 2014-05-08 NOTE — Telephone Encounter (Signed)
Ok, done 

## 2014-05-08 NOTE — Telephone Encounter (Signed)
Patient is calling to request that the directions on her Tramadol rx be changed to "take 1 twice a day" in the future. Says that she does not take 1 every 6 hours. (See previous closed phn encounter)

## 2014-05-08 NOTE — Telephone Encounter (Signed)
Called the patient left a detailed message BP script sent to [facility] and did leave details of MD instructions regarding the tramadol request for mail order refills.

## 2014-07-18 ENCOUNTER — Encounter: Payer: Self-pay | Admitting: Internal Medicine

## 2014-07-18 ENCOUNTER — Ambulatory Visit (INDEPENDENT_AMBULATORY_CARE_PROVIDER_SITE_OTHER): Payer: Commercial Managed Care - HMO | Admitting: Internal Medicine

## 2014-07-18 VITALS — BP 160/82 | HR 66 | Temp 98.6°F | Wt 162.0 lb

## 2014-07-18 DIAGNOSIS — R7309 Other abnormal glucose: Secondary | ICD-10-CM

## 2014-07-18 DIAGNOSIS — IMO0002 Reserved for concepts with insufficient information to code with codable children: Secondary | ICD-10-CM

## 2014-07-18 DIAGNOSIS — Z23 Encounter for immunization: Secondary | ICD-10-CM

## 2014-07-18 DIAGNOSIS — R7302 Impaired glucose tolerance (oral): Secondary | ICD-10-CM

## 2014-07-18 DIAGNOSIS — M5416 Radiculopathy, lumbar region: Secondary | ICD-10-CM

## 2014-07-18 DIAGNOSIS — I10 Essential (primary) hypertension: Secondary | ICD-10-CM

## 2014-07-18 HISTORY — DX: Impaired glucose tolerance (oral): R73.02

## 2014-07-18 MED ORDER — HYDROCODONE-ACETAMINOPHEN 5-325 MG PO TABS: 1.0000 | ORAL_TABLET | Freq: Four times a day (QID) | ORAL | Status: DC | PRN

## 2014-07-18 NOTE — Assessment & Plan Note (Signed)
Vs right sciatica - for MRI, and refer Dr Mare Ferrari with neuro changes and chronic pain x 3 mo worsening

## 2014-07-18 NOTE — Progress Notes (Signed)
Pre visit review using our clinic review tool, if applicable. No additional management support is needed unless otherwise documented below in the visit note. 

## 2014-07-18 NOTE — Patient Instructions (Signed)
Please take all new medication as prescribed  - the pain medication if needed  Please continue all other medications as before, and refills have been done if requested.  Please have the pharmacy call with any other refills you may need.  Please continue your efforts at being more active, low cholesterol diet, and weight control.  You are otherwise up to date with prevention measures today.  Please keep your appointments with your specialists as you may have planned  You will be contacted regarding the referral for: MRI, and Dr Wynelle Link

## 2014-07-18 NOTE — Progress Notes (Signed)
Subjective:    Patient ID: [name], female    DOB: 17-Apr-1936, 78 y.o.   MRN: [id number]  HPI  Here with c/o lower back pain, has seen ortho s/p bilat knees, no prior back surgury.  Since June 2015 with increased intermittent mild to mod shapr and dull pains, worse to mid lower back, radation to the right buttock and right post mid leg; Nobowel or bladder change, fever, wt loss,  worsening LE /numbness/weakness, gait change or falls. Does have to pick up the leg to get into the car.    No prior xrays or MRI, except maybe an xray 30 yrs ago.  Denies urinary symptoms such as dysuria, frequency, urgency, flank pain, hematuria or n/v, fever, chills.  . Past Medical History  Diagnosis Date  . Colon polyp   . Chronic rhinitis   . Morbid obesity   . DJD (degenerative joint disease)   . Hypertension   . Chronic renal insufficiency   . Memory loss   . Anemia   . GERD (gastroesophageal reflux disease)   . Wears glasses   . Full dentures   . Impaired glucose tolerance 07/18/2014   Past Surgical History  Procedure Laterality Date  . Cataract extraction  2009    rt  . Vesicovaginal fistula closure w/ tah  1980  . Total knee arthroplasty  2002    rt  . Total knee arthroplasty  2003    left  . Abdominal hysterectomy    . Tonsillectomy    . Knee arthroscopy      both  . Colonoscopy    . Breast lumpectomy with needle localization and axillary sentinel lymph node bx Left 12/04/2013    Procedure: BREAST LUMPECTOMY WITH NEEDLE LOCALIZATION AND AXILLARY SENTINEL LYMPH NODE BX;  Surgeon: Shann Medal, MD;  Location: [facility];  Service: General;  Laterality: Left;    reports that she has never smoked. She has never used smokeless tobacco. She reports that she does not drink alcohol or use illicit drugs. family history includes Colon cancer in her maternal aunt and mother; Heart disease in an other family member; Lung cancer in her brother. Allergies  Allergen Reactions    . Iron     REACTION: hives/whelps/bad constipation   Current Outpatient Prescriptions on File Prior to Visit  Medication Sig Dispense Refill  . Acetaminophen (TYLENOL ARTHRITIS PAIN PO) Take by mouth. Take as directed       . anastrozole (ARIMIDEX) 1 MG tablet Take 1 tablet (1 mg total) by mouth daily.  90 tablet  4  . aspirin 81 MG tablet Take 81 mg by mouth daily.        . Cholecalciferol (VITAMIN D3) 2000 UNITS TABS Take 1 tablet by mouth daily.       . fexofenadine (ALLEGRA) 180 MG tablet Take 180 mg by mouth daily as needed.        [name] glucosamine-chondroitin 500-400 MG tablet Take 1 tablet by mouth daily.       [name] losartan-hydrochlorothiazide (HYZAAR) 50-12.5 MG per tablet Take 1 tablet by mouth daily.  90 tablet  3  . Multiple Vitamins-Minerals (CENTRUM SILVER PO) Take 1 tablet by mouth daily.        . Simethicone (GAS-X PO) Take by mouth. Take as directed       . traMADol (ULTRAM) 50 MG tablet Take 1 tablet (50 mg total) by mouth 2 (two) times daily.  120 tablet  2  . famotidine (  PEPCID) 20 MG tablet 20 mg at bedtime. One at bedtime      . omeprazole (PRILOSEC) 20 MG capsule Take 20 mg by mouth daily.       No current facility-administered medications on file prior to visit.   Review of Systems Wt Readings from Last 3 Encounters:  07/18/14 162 lb (73.483 kg)  03/28/14 160 lb (72.576 kg)  02/20/14 160 lb 8 oz (72.802 kg)   Constitutional: Negative for unusual diaphoresis or other sweats  HENT: Negative for ringing in ear Eyes: Negative for double vision or worsening visual disturbance.  Respiratory: Negative for choking and stridor.   Gastrointestinal: Negative for vomiting or other signifcant bowel change Genitourinary: Negative for hematuria or decreased urine volume.  Musculoskeletal: Negative for other MSK pain or swelling Skin: Negative for color change and worsening wound.  Neurological: Negative for tremors and numbness other than noted  Psychiatric/Behavioral: Negative  for decreased concentration or agitation other than above      Objective:   Physical Exam BP 160/82  Pulse 66  Temp(Src) 98.6 F (37 C) (Oral)  Wt 162 lb (73.483 kg)  SpO2 94% VS noted,  Constitutional: Pt appears well-developed, well-nourished.  HENT: Head: NCAT.  Right Ear: External ear normal.  Left Ear: External ear normal.  Eyes: . Pupils are equal, round, and reactive to light. Conjunctivae and EOM are normal Neck: Normal range of motion. Neck supple.  Cardiovascular: Normal rate and regular rhythm.   Pulmonary/Chest: Effort normal and breath sounds normal.  Abd:  Soft, NT, ND, + BS, Spine nontender Neurological: Pt is alert. Not confused , motor intact except for 4+5 distal RLE, diminished patellar reflex Skin: Skin is warm. No rash Psychiatric: Pt behavior is normal. No agitation.     Assessment & Plan:

## 2014-07-22 NOTE — Assessment & Plan Note (Signed)
stable overall by history and exam, recent data reviewed with pt, and pt to continue medical treatment as before,  to f/u any worsening symptoms or concerns Lab Results  Component Value Date   WBC 5.6 03/28/2014   HGB 10.9* 03/28/2014   HCT 33.1* 03/28/2014   PLT 249 03/28/2014   GLUCOSE 144* 03/28/2014   CHOL 177 02/20/2014   TRIG 101.0 02/20/2014   HDL 59.80 02/20/2014   LDLCALC 97 02/20/2014   ALT 22 03/28/2014   AST 26 03/28/2014   NA 135* 03/28/2014   K 4.2 03/28/2014   CL 99 02/20/2014   CREATININE 1.3* 03/28/2014   BUN 24.0 03/28/2014   CO2 22 03/28/2014   TSH 2.47 02/20/2014

## 2014-07-22 NOTE — Assessment & Plan Note (Signed)
stable overall by history and exam, recent data reviewed with pt, and pt to continue medical treatment as before,  to f/u any worsening symptoms or concerns BP Readings from Last 3 Encounters:  07/18/14 160/82  03/28/14 186/85  02/20/14 134/80

## 2014-08-09 ENCOUNTER — Inpatient Hospital Stay: Admission: RE | Admit: 2014-08-09 | Payer: Commercial Managed Care - HMO | Source: Ambulatory Visit

## 2014-08-15 ENCOUNTER — Telehealth: Payer: Self-pay | Admitting: *Deleted

## 2014-08-15 NOTE — Telephone Encounter (Signed)
Pt called stating noted increase in arthritis symptoms in her hands for the past 3 weeks. Notable with activities. " almost feeling like my joints are popping ".  Above discussed with pt including above possible side effects as well as known reason for use of medication related to her history of breast cancer.  Plan per discussion is [name] will hold the arimidex for 2 weeks and call this RN.  She will monitor symptoms for evaluation of improvement.  If improvement noted pt will stay off the medication and appointment will be made for discussion regarding medication.

## 2014-08-15 NOTE — Telephone Encounter (Signed)
This RN attempted to contact pt per written message " pt is having side effects of medication " and per prior call by nurse " phone disconnected ".  This RN attempted per verified number and obtained same recording. This RN then called emergency contact - daughter- [name] and left message on identified VM.

## 2014-08-28 ENCOUNTER — Telehealth: Payer: Self-pay | Admitting: *Deleted

## 2014-08-28 MED ORDER — HYDROCODONE-ACETAMINOPHEN 5-325 MG PO TABS: 1.0000 | ORAL_TABLET | Freq: Four times a day (QID) | ORAL | Status: DC | PRN

## 2014-08-28 NOTE — Telephone Encounter (Signed)
Notified pt rx ready for pick-up.../lmb 

## 2014-08-28 NOTE — Telephone Encounter (Signed)
Left msg on triage requesting refill on hydrocodone.../lmb 

## 2014-08-28 NOTE — Telephone Encounter (Signed)
Done hardcopy to robin  

## 2014-08-30 ENCOUNTER — Other Ambulatory Visit: Payer: Self-pay | Admitting: *Deleted

## 2014-08-30 NOTE — Progress Notes (Signed)
This RN spoke with pt per her concerns with general myathgais and noted joint "popping " since stating the arimidex.  She is able to carry on more ADL's without noted difficulty or pain.  " I can feel that the longer I am off the med I am feeling better ".  Plan at present is for pt to continue off the medication.  Appointment made for follow up with MD in 2 weeks with request for pt to maintain a history of how she is doing off the medication for MD review.

## 2014-09-24 ENCOUNTER — Ambulatory Visit: Payer: Commercial Managed Care - HMO | Admitting: Oncology

## 2014-10-09 ENCOUNTER — Telehealth: Payer: Self-pay | Admitting: *Deleted

## 2014-10-09 NOTE — Telephone Encounter (Signed)
Received message from patient stating she didn't know what to do about restarting her anastrozole or not.  Left her message for a return phone call to get her a follow up with Heather,NP.  Awaiting patient response.

## 2014-10-11 ENCOUNTER — Telehealth: Payer: Self-pay | Admitting: *Deleted

## 2014-10-11 NOTE — Telephone Encounter (Signed)
Received call back from patient and confirmed appointment with Heather,NP for 10/23/14 at 215pm to discuss anastrozole.

## 2014-10-18 ENCOUNTER — Telehealth: Payer: Self-pay | Admitting: Internal Medicine

## 2014-10-18 NOTE — Telephone Encounter (Signed)
Patient informed of PCP instructions. 

## 2014-10-18 NOTE — Telephone Encounter (Signed)
Pt called in requesting refill on her HYDROcodone-acetaminophen (NORCO/VICODIN) 5-325 MG per tablet [id number]

## 2014-10-18 NOTE — Telephone Encounter (Signed)
Unfortunately I have to decline, as I only treat acute pain, and appears her pain is now more chronic lower back  I no longer treat chronic pain  I see where pt was seen sept 2015, but i dont see results of the MRI and Neurosurgury evaluations, and this would be needed to consider further pain medication  I cannot simply prescribe ongoing pain medication without appropriate other evaluation and treatment to determine the cause and treatment of the pain

## 2014-10-23 ENCOUNTER — Encounter: Payer: Self-pay | Admitting: Nurse Practitioner

## 2014-10-23 ENCOUNTER — Ambulatory Visit (HOSPITAL_BASED_OUTPATIENT_CLINIC_OR_DEPARTMENT_OTHER): Payer: Commercial Managed Care - HMO | Admitting: Nurse Practitioner

## 2014-10-23 ENCOUNTER — Telehealth: Payer: Self-pay | Admitting: Nurse Practitioner

## 2014-10-23 VITALS — BP 158/78 | HR 62 | Temp 98.1°F | Resp 18 | Ht 62.0 in | Wt 165.1 lb

## 2014-10-23 DIAGNOSIS — M069 Rheumatoid arthritis, unspecified: Secondary | ICD-10-CM

## 2014-10-23 DIAGNOSIS — C50412 Malignant neoplasm of upper-outer quadrant of left female breast: Secondary | ICD-10-CM

## 2014-10-23 DIAGNOSIS — Z170 Estrogen receptor positive status [ER+]: Secondary | ICD-10-CM

## 2014-10-23 MED ORDER — LETROZOLE 2.5 MG PO TABS: 2.5000 mg | ORAL_TABLET | Freq: Every day | ORAL | Status: DC

## 2014-10-23 NOTE — Telephone Encounter (Signed)
per pof to sch pt appt-gave pt copy of sch °

## 2014-10-23 NOTE — Progress Notes (Signed)
[facility]  Telephone:([phone]) [phone] Fax:(336) [phone]     ID: [name] OB: 14-Jan-1936  MR#: [id number]  IPJ#:[id number]  PCP: Cathlean Cower, MD GYN:  [name], MD SU: [name], MD OTHER MD: [name], MD; [name], MD; [name], MD  CHIEF COMPLAINT:  Hx of Left Breast Cancer CURRENT TREATMENT: anastrozole (on hold)   BREAST CANCER HISTORY: The patient underwent routine screening mammography at [facility] 10/09/2013 showing a 1 cm cluster of calcifications in the right breast at 5:00. There was also a 9 mm irregular density in the left breast laterally. Bilateral diagnostic mammography with tomography at [facility] 11/01/2013 confirmed a 1 cm cluster of vascular calcifications at the 5:00 position of the right breast, which were felt to be stable. However in the left breast there was a new 1.1 cm mass with spiculated margins. This was at 1:00, 8 cm from the nipple. Ultrasound of the left breast the same day showed the mass to measure 6 mm and to be hypoechoic. The left axilla was unremarkable.  Biopsy of the left breast mass in question 11/06/2013 showed ([id number]) and invasive ductal carcinoma, grade 2, estrogen receptor 100% positive, progesterone receptor 95% positive, both with strong staining intensity, with an MIB-1 of 19% and no HER-2 amplification, the signals ratio being 0.95 and the number per cell 2.00. E-cadherin was positive.  The patient's subsequent history is as detailed below  INTERVAL HISTORY: [name] returns alone today for follow up of her left breast cancer. She stopped her anastrozle twice in the past 2 months because of worsening arthritis pain. She has a history of rheumatoid arthritis and typically has pain to her hands, lower back, and knees. Every time she restarted the drug the pain returns. She denies hot flashes or vaginal changes. [name] is fairly active. She plans to begin water aerobics in hopes that it will alleviate  some of her back pain. She was prescribed norco for pain by her PCP, but has only been using extra strength tylenol lately.    REVIEW OF SYSTEMS: [name] denies fevers, chills, nausea, vomiting, or change sin bowel or bladder habits. She has reflux and uses both omeprazole and famotidine. Her appetite is healthy and she "tries to eat the right things" and stay well hydrated. She is short of breath with exhaustion at time, but denies cough, palpitations, or fatigued. She denies headaches, dizziness, unexplained weight loss, or night sweats. She has some swelling to her lower limbs at the end of the day, but this resolves with elevation. A detailed review of systems is otherwise negative.   PAST MEDICAL HISTORY: Past Medical History  Diagnosis Date  . Colon polyp   . Chronic rhinitis   . Morbid obesity   . DJD (degenerative joint disease)   . Hypertension   . Chronic renal insufficiency   . Memory loss   . Anemia   . GERD (gastroesophageal reflux disease)   . Wears glasses   . Full dentures   . Impaired glucose tolerance 07/18/2014  . Breast cancer of upper-outer quadrant of left female breast 11/08/2013    ER/PR+ Her2- Left IDC     PAST SURGICAL HISTORY: Past Surgical History  Procedure Laterality Date  . Cataract extraction  2009    rt  . Vesicovaginal fistula closure w/ tah  1980  . Total knee arthroplasty  2002    rt  . Total knee arthroplasty  2003    left  . Abdominal hysterectomy    .  Tonsillectomy    . Knee arthroscopy      both  . Colonoscopy    . Breast lumpectomy with needle localization and axillary sentinel lymph node bx Left 12/04/2013    Procedure: BREAST LUMPECTOMY WITH NEEDLE LOCALIZATION AND AXILLARY SENTINEL LYMPH NODE BX;  Surgeon: Shann Medal, MD;  Location: [facility];  Service: General;  Laterality: Left;   status post hysterectomy with bilateral sloping oophorectomy  FAMILY HISTORY Family History  Problem Relation Age of Onset  .  Heart disease      remote family  . Lung cancer Brother     was a smoker  . Colon cancer Mother   . Colon cancer Maternal Aunt    the patient's mother died at the age of 40, with a history of colon cancer. The patient's father died at the age of 1 with "stomach cancer". The patient had 5 brothers, 3 sisters. One brother had lung cancer diagnosed at age 11. One maternal aunt had colon cancer at age 51. There is no history of breast or ovarian cancer in the family to her knowledge.  GYNECOLOGIC HISTORY:   (Reviewed 03/28/2014) Menarche age 26, first live birth age 22, the patient is [name] P2. She underwent hysterectomy with bilateral salpingo-oophorectomy approximately 34 years ago. She did not use hormone replacement. She did use birth control remotely, for about 15 years, with no complications.  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 She is widowed, lives alone with no pets. Son [name] died from a myocardial infarction at the age of 28. Daughter [name] works in [place] as an Microbiologist (at [employer]). The patient has 2 grandchildren and one great-grandchild. She is a Psychologist, forensic.    ADVANCED DIRECTIVES: In place; the patient's daughter [name] is her healthcare power of attorney. [name] can be reached at [phone] (cell), and [phone] (work).   HEALTH MAINTENANCE:  (Updated 03/28/2014) History  Substance Use Topics  . Smoking status: Never Smoker   . Smokeless tobacco: Never Used  . Alcohol Use: No    Colonoscopy:  Oct 2011/DrHenrene Pastor  PAP: s/p hysterectomy   Bone density: 03/15/2013 at [facility] [place], shows normal bone density  Lipid panel:  April 2015/Dr. John   Allergies  Allergen Reactions  . Iron     REACTION: hives/whelps/bad constipation    Current Outpatient Prescriptions  Medication Sig Dispense Refill  . Acetaminophen (TYLENOL ARTHRITIS  PAIN PO) Take by mouth. Take as directed     . aspirin 81 MG tablet Take 81 mg by mouth daily.      . Cholecalciferol (VITAMIN D3) 2000 UNITS TABS Take 1 tablet by mouth daily.     . famotidine (PEPCID) 20 MG tablet Take 20 mg by mouth at bedtime.    [name] glucosamine-chondroitin 500-400 MG tablet Take 1 tablet by mouth daily.     [name] losartan-hydrochlorothiazide (HYZAAR) 50-12.5 MG per tablet Take 1 tablet by mouth daily. 90 tablet 3  . Multiple Vitamins-Minerals (CENTRUM SILVER PO) Take 1 tablet by mouth daily.      [name] anastrozole (ARIMIDEX) 1 MG tablet Take 1 tablet (1 mg total) by mouth daily. (Patient not taking: Reported on 10/23/2014) 90 tablet 4  . fexofenadine (ALLEGRA) 180 MG tablet Take 180 mg by mouth daily as needed.      [name] HYDROcodone-acetaminophen (NORCO/VICODIN) 5-325 MG per tablet Take 1 tablet by mouth  every 6 (six) hours as needed for moderate pain. (Patient not taking: Reported on 10/23/2014) 40 tablet 0  . letrozole (FEMARA) 2.5 MG tablet Take 1 tablet (2.5 mg total) by mouth daily. 30 tablet 2  . Simethicone (GAS-X PO) Take by mouth. Take as directed      No current facility-administered medications for this visit.    OBJECTIVE: Older African American woman appears comfortable  in no acute distress Filed Vitals:   10/23/14 1450  BP: 158/78  Pulse:   Temp:   Resp:      Body mass index is 30.19 kg/(m^2).    ECOG FS: 1 Filed Weights   10/23/14 1412  Weight: 165 lb 1.6 oz (74.889 kg)   Physical Exam: Skin: warm, dry  HEENT: sclerae anicteric, conjunctivae pink, oropharynx clear. No thrush or mucositis.  Lymph Nodes: No cervical or supraclavicular lymphadenopathy  Lungs: clear to auscultation bilaterally, no rales, wheezes, or rhonci  Heart: regular rate and rhythm  Abdomen: round, soft, non tender, positive bowel sounds  Musculoskeletal: No focal spinal tenderness, no peripheral edema  Neuro: non focal, well oriented, positive affect  Breasts: left breast status post  lumpectomy. Small scar tissue palpated under incision line. Left axilla benign. Right breast unremarkable.  LAB RESULTS:   Lab Results  Component Value Date   WBC 5.6 03/28/2014   NEUTROABS 3.0 03/28/2014   HGB 10.9* 03/28/2014   HCT 33.1* 03/28/2014   MCV 95.1 03/28/2014   PLT 249 03/28/2014      Chemistry      Component Value Date/Time   NA 135* 03/28/2014 1112   NA 136 02/20/2014 1101   K 4.2 03/28/2014 1112   K 3.9 02/20/2014 1101   CL 99 02/20/2014 1101   CO2 22 03/28/2014 1112   CO2 29 02/20/2014 1101   BUN 24.0 03/28/2014 1112   BUN 30* 02/20/2014 1101   CREATININE 1.3* 03/28/2014 1112   CREATININE 1.2 02/20/2014 1101      Component Value Date/Time   CALCIUM 9.4 03/28/2014 1112   CALCIUM 9.9 02/20/2014 1101   ALKPHOS 63 03/28/2014 1112   ALKPHOS 58 02/20/2014 1101   AST 26 03/28/2014 1112   AST 28 02/20/2014 1101   ALT 22 03/28/2014 1112   ALT 30 02/20/2014 1101   BILITOT 0.39 03/28/2014 1112   BILITOT 0.5 02/20/2014 1101       STUDIES:  Most recent bone density at [facility] on 03/15/2013 was normal.   Most recent mammogram on 09/18/14 at [facility] showed regional calcifications in the left breast that are likely benign. A follow up mammogram in 6 months is advised.   ASSESSMENT: 78 y.o. [place] woman status post left breast biopsy 11/06/2013 for a clinical T1n N0, stage IA invasive ductal carcinoma, grade 2, estrogen receptor 100% positive, progesterone receptor 95% positive, with an MIB-1 of 19% and no HER-2 amplification  (1) status post left lumpectomy and sentinel lymph node sampling 12/04/2013 for a pT1c pN0, stage IA invasive ductal carcinoma, grade 1, repeat HER-2 again negative, with ample margins  (2) anastrozole started March 2015, the plan being to continue for total of 5 years until March 2020; bone density May 2014 was normal  (3) foregoing radiation therapy since she is tolerating the aromatase inhibitor.     PLAN: [name] is doing moderately well today. No labs were drawn at this visit to review. We spent about 25 minute discussing her joint pain and its relation to aromatase inhibitors, specifically anastrozole. She  has been off of this drug for 2 weeks and seen an improvement in her symptoms. We will switch her to letrozole daily to see if she can tolerate this drug any better. She understands that they are in the same drug class and technically have the same side effects, but a number of our patients see a difference between the 2 drugs. She understands that this drug is also generic and she will call if she feels like she is not obtaining it for a fair price.   [name] will return in ~2 months for labs and a follow up visit. She understands and agrees with this plan. She knows the goal of treatment in her case is cure. She has been encouraged to call with any issues that might arise before her next visit here.  Felicia Duster, NP   10/23/2014 3:21 PM

## 2014-10-29 ENCOUNTER — Other Ambulatory Visit: Payer: Commercial Managed Care - HMO

## 2014-10-29 ENCOUNTER — Ambulatory Visit: Payer: Commercial Managed Care - HMO | Admitting: Nurse Practitioner

## 2014-11-27 ENCOUNTER — Ambulatory Visit (INDEPENDENT_AMBULATORY_CARE_PROVIDER_SITE_OTHER): Payer: Commercial Managed Care - HMO | Admitting: Internal Medicine

## 2014-11-27 ENCOUNTER — Other Ambulatory Visit (INDEPENDENT_AMBULATORY_CARE_PROVIDER_SITE_OTHER): Payer: Commercial Managed Care - HMO

## 2014-11-27 ENCOUNTER — Encounter: Payer: Self-pay | Admitting: Internal Medicine

## 2014-11-27 VITALS — BP 152/82 | HR 64 | Temp 99.1°F | Ht 62.0 in | Wt 165.0 lb

## 2014-11-27 DIAGNOSIS — Z0000 Encounter for general adult medical examination without abnormal findings: Secondary | ICD-10-CM

## 2014-11-27 DIAGNOSIS — I10 Essential (primary) hypertension: Secondary | ICD-10-CM

## 2014-11-27 DIAGNOSIS — R509 Fever, unspecified: Secondary | ICD-10-CM

## 2014-11-27 DIAGNOSIS — R7302 Impaired glucose tolerance (oral): Secondary | ICD-10-CM

## 2014-11-27 DIAGNOSIS — M5416 Radiculopathy, lumbar region: Secondary | ICD-10-CM

## 2014-11-27 LAB — CBC WITH DIFFERENTIAL/PLATELET
Basophils Absolute: 0.1 10*3/uL (ref 0.0–0.1)
Basophils Relative: 1 % (ref 0.0–3.0)
Eosinophils Absolute: 0.2 10*3/uL (ref 0.0–0.7)
Eosinophils Relative: 3.5 % (ref 0.0–5.0)
HCT: 32.5 % — ABNORMAL LOW (ref 36.0–46.0)
Hemoglobin: 11.1 g/dL — ABNORMAL LOW (ref 12.0–15.0)
Lymphocytes Relative: 33.8 % (ref 12.0–46.0)
Lymphs Abs: 1.9 10*3/uL (ref 0.7–4.0)
MCHC: 34.3 g/dL (ref 30.0–36.0)
MCV: 93 fl (ref 78.0–100.0)
Monocytes Absolute: 0.5 10*3/uL (ref 0.1–1.0)
Monocytes Relative: 9.2 % (ref 3.0–12.0)
Neutro Abs: 2.9 10*3/uL (ref 1.4–7.7)
Neutrophils Relative %: 52.5 % (ref 43.0–77.0)
Platelets: 242 10*3/uL (ref 150.0–400.0)
RBC: 3.49 Mil/uL — ABNORMAL LOW (ref 3.87–5.11)
RDW: 13.9 % (ref 11.5–15.5)
WBC: 5.5 10*3/uL (ref 4.0–10.5)

## 2014-11-27 LAB — BASIC METABOLIC PANEL
BUN: 24 mg/dL — ABNORMAL HIGH (ref 6–23)
CO2: 30 mEq/L (ref 19–32)
Calcium: 9.9 mg/dL (ref 8.4–10.5)
Chloride: 97 mEq/L (ref 96–112)
Creatinine, Ser: 1.19 mg/dL (ref 0.40–1.20)
GFR: 56.56 mL/min — ABNORMAL LOW (ref >60.00)
Glucose, Bld: 94 mg/dL (ref 70–99)
Potassium: 4.4 mEq/L (ref 3.5–5.1)
Sodium: 131 mEq/L — ABNORMAL LOW (ref 135–145)

## 2014-11-27 LAB — URINALYSIS, ROUTINE W REFLEX MICROSCOPIC
Bilirubin Urine: NEGATIVE
Ketones, ur: NEGATIVE
Nitrite: NEGATIVE
Specific Gravity, Urine: <=1.005 — AB (ref 1.000–1.030)
Total Protein, Urine: NEGATIVE
Urine Glucose: NEGATIVE
Urobilinogen, UA: 0.2 (ref 0.0–1.0)
pH: 6 (ref 5.0–8.0)

## 2014-11-27 LAB — LIPID PANEL
Cholesterol: 186 mg/dL (ref 0–200)
HDL: 59 mg/dL (ref >39.00)
LDL Cholesterol: 98 mg/dL (ref 0–99)
NonHDL: 127
Total CHOL/HDL Ratio: 3
Triglycerides: 144 mg/dL (ref 0.0–149.0)
VLDL: 28.8 mg/dL (ref 0.0–40.0)

## 2014-11-27 LAB — HEPATIC FUNCTION PANEL
ALT: 18 U/L (ref 0–35)
AST: 22 U/L (ref 0–37)
Albumin: 4.1 g/dL (ref 3.5–5.2)
Alkaline Phosphatase: 56 U/L (ref 39–117)
Bilirubin, Direct: 0.1 mg/dL (ref 0.0–0.3)
Total Bilirubin: 0.4 mg/dL (ref 0.2–1.2)
Total Protein: 7.2 g/dL (ref 6.0–8.3)

## 2014-11-27 LAB — TSH: TSH: 1.66 u[IU]/mL (ref 0.35–4.50)

## 2014-11-27 LAB — HEMOGLOBIN A1C: Hgb A1c MFr Bld: 5.6 % (ref 4.6–6.5)

## 2014-11-27 MED ORDER — LOSARTAN POTASSIUM-HCTZ 100-12.5 MG PO TABS: 1.0000 | ORAL_TABLET | Freq: Every day | ORAL | Status: DC

## 2014-11-27 NOTE — Assessment & Plan Note (Signed)
stable overall by history and exam, recent data reviewed with pt, and pt to continue medical treatment as before,  to f/u any worsening symptoms or concerns, for f/u a1c 

## 2014-11-27 NOTE — Progress Notes (Signed)
Pre visit review using our clinic review tool, if applicable. No additional management support is needed unless otherwise documented below in the visit note. 

## 2014-11-27 NOTE — Assessment & Plan Note (Signed)
?   Etiology, exam benign, to check UA with labs,  to f/u any worsening symptoms or concerns

## 2014-11-27 NOTE — Assessment & Plan Note (Signed)
For f/u referral to Dr Rolena Infante

## 2014-11-27 NOTE — Patient Instructions (Signed)
OK to increase the losartan-HCT to 100/12.5 mg daily  Please continue all other medications as before, and refills have been done if requested.  Please have the pharmacy call with any other refills you may need.  Please continue your efforts at being more active, low cholesterol diet, and weight control.  You are otherwise up to date with prevention measures today.  Please keep your appointments with your specialists as you may have planned  Please go to the LAB in the Basement (turn left off the elevator) for the tests to be done today  You will be contacted regarding the referral for: Dr Rolena Infante for the back  You will be contacted by phone if any changes need to be made immediately.  Otherwise, you will receive a letter about your results with an explanation, but please check with MyChart first.  Please remember to sign up for MyChart if you have not done so, as this will be important to you in the future with finding out test results, communicating by private email, and scheduling acute appointments online when needed.  Please return in 6 months, or sooner if needed, with Lab testing done 3-5 days before

## 2014-11-27 NOTE — Assessment & Plan Note (Signed)
Mild uncontrolled, for increase los HCT to 100/12.5 qd,  to f/u any worsening symptoms or concerns

## 2014-11-27 NOTE — Progress Notes (Signed)
Subjective:    Patient ID: [name], female    DOB: 1935-12-28, 79 y.o.   MRN: [id number]  HPI  Here for wellness and f/u;  Overall doing ok;  Pt denies CP, worsening SOB, DOE, wheezing, orthopnea, PND, worsening LE edema, palpitations, dizziness or syncope, except  Incidentally did not sleep well last pm, did not feel well to get OOB, and some lightheaded. Has low grade temp, but no ST but has mild recent nonprod cough..  Pt denies neurological change such as new headache, facial or extremity weakness.  Pt denies polydipsia, polyuria, or low sugar symptoms. Pt states overall good compliance with treatment and medications, good tolerability, and has been trying to follow lower cholesterol diet.  Pt denies worsening depressive symptoms, suicidal ideation or panic. No fever, night sweats, wt loss, loss of appetite, or other constitutional symptoms.  Pt states good ability with ADL's, has low fall risk, home safety reviewed and adequate, no other significant changes in hearing or vision, and only occasionally active with exercise. Has f/u with oncology feb 23.  Did have flare of pain to right thumb CMP joint with swelling, so meds adjusted. Started femara early jan 2016.  No further joint pain.  Pt continues to have recurring LBP without change in severity, bowel or bladder change, fever, wt loss,  worsening LE pain/numbness/weakness, gait change or falls.Was suggested to see Dr Renea Ee and MRI, but pt declined due to cost at the time. Now want to pursue this as her finances are improved. Due for left cataract surgyury soon. Past Medical History  Diagnosis Date  . Colon polyp   . Chronic rhinitis   . Morbid obesity   . DJD (degenerative joint disease)   . Hypertension   . Chronic renal insufficiency   . Memory loss   . Anemia   . GERD (gastroesophageal reflux disease)   . Wears glasses   . Full dentures   . Impaired glucose tolerance 07/18/2014  . Breast cancer of upper-outer quadrant of  left female breast 11/08/2013    ER/PR+ Her2- Left IDC    Past Surgical History  Procedure Laterality Date  . Cataract extraction  2009    rt  . Vesicovaginal fistula closure w/ tah  1980  . Total knee arthroplasty  2002    rt  . Total knee arthroplasty  2003    left  . Abdominal hysterectomy    . Tonsillectomy    . Knee arthroscopy      both  . Colonoscopy    . Breast lumpectomy with needle localization and axillary sentinel lymph node bx Left 12/04/2013    Procedure: BREAST LUMPECTOMY WITH NEEDLE LOCALIZATION AND AXILLARY SENTINEL LYMPH NODE BX;  Surgeon: Shann Medal, MD;  Location: [facility];  Service: General;  Laterality: Left;    reports that she has never smoked. She has never used smokeless tobacco. She reports that she does not drink alcohol or use illicit drugs. family history includes Colon cancer in her maternal aunt and mother; Heart disease in an other family member; Lung cancer in her brother. Allergies  Allergen Reactions  . Iron     REACTION: hives/whelps/bad constipation   Current Outpatient Prescriptions on File Prior to Visit  Medication Sig Dispense Refill  . Acetaminophen (TYLENOL ARTHRITIS PAIN PO) Take by mouth. Take as directed     . anastrozole (ARIMIDEX) 1 MG tablet Take 1 tablet (1 mg total) by mouth daily. 90 tablet 4  . aspirin  81 MG tablet Take 81 mg by mouth daily.      . Cholecalciferol (VITAMIN D3) 2000 UNITS TABS Take 1 tablet by mouth daily.     . famotidine (PEPCID) 20 MG tablet Take 20 mg by mouth at bedtime.    . fexofenadine (ALLEGRA) 180 MG tablet Take 180 mg by mouth daily as needed.      [name] glucosamine-chondroitin 500-400 MG tablet Take 1 tablet by mouth daily.     [name] HYDROcodone-acetaminophen (NORCO/VICODIN) 5-325 MG per tablet Take 1 tablet by mouth every 6 (six) hours as needed for moderate pain. 40 tablet 0  . letrozole (FEMARA) 2.5 MG tablet Take 1 tablet (2.5 mg total) by mouth daily. 30 tablet 2  .  losartan-hydrochlorothiazide (HYZAAR) 50-12.5 MG per tablet Take 1 tablet by mouth daily. 90 tablet 3  . Multiple Vitamins-Minerals (CENTRUM SILVER PO) Take 1 tablet by mouth daily.      . Simethicone (GAS-X PO) Take by mouth. Take as directed      No current facility-administered medications on file prior to visit.    Review of Systems Constitutional: Negative for increased diaphoresis, other activity, appetite or other siginficant weight change  HENT: Negative for worsening hearing loss, ear pain, facial swelling, mouth sores and neck stiffness.   Eyes: Negative for other worsening pain, redness or visual disturbance.  Respiratory: Negative for shortness of breath and wheezing.   Cardiovascular: Negative for chest pain and palpitations.  Gastrointestinal: Negative for diarrhea, blood in stool, abdominal distention or other pain Genitourinary: Negative for hematuria, flank pain or change in urine volume.  Musculoskeletal: Negative for myalgias or other joint complaints.  Skin: Negative for color change and wound.  Neurological: Negative for syncope and numbness. other than noted Hematological: Negative for adenopathy. or other swelling Psychiatric/Behavioral: Negative for hallucinations, self-injury, decreased concentration or other worsening agitation.      Objective:   Physical Exam BP 152/82 mmHg  Pulse 64  Temp(Src) 99.1 F (37.3 C) (Oral)  Ht $R'5\' 2"'tB$  (1.575 m)  Wt 165 lb (74.844 kg)  BMI 30.17 kg/m2  SpO2 93% VS noted,  Constitutional: Pt is oriented to person, place, and time. Appears well-developed and well-nourished.  Head: Normocephalic and atraumatic.  Right Ear: External ear normal.  Left Ear: External ear normal.  Nose: Nose normal.  Mouth/Throat: Oropharynx is clear and moist.  Eyes: Conjunctivae and EOM are normal. Pupils are equal, round, and reactive to light.  Neck: Normal range of motion. Neck supple. No JVD present. No tracheal deviation present.    Cardiovascular: Normal rate, regular rhythm, normal heart sounds and intact distal pulses.   Pulmonary/Chest: Effort normal and breath sounds without rales or wheezing  Abdominal: Soft. Bowel sounds are normal. NT. No HSM  Musculoskeletal: Normal range of motion. Exhibits no edema.  Lymphadenopathy:  Has no cervical adenopathy.  Neurological: Pt is alert and oriented to person, place, and time. Pt has normal reflexes. No cranial nerve deficit. Motor grossly intact Skin: Skin is warm and dry. No rash noted.  Psychiatric:  Has normal mood and affect. Behavior is normal.  No joint effusions or tender today BP Readings from Last 3 Encounters:  11/27/14 152/82  10/23/14 158/78  07/18/14 160/82       Assessment & Plan:

## 2014-11-27 NOTE — Assessment & Plan Note (Signed)

## 2014-11-28 ENCOUNTER — Encounter: Payer: Self-pay | Admitting: Internal Medicine

## 2014-11-28 ENCOUNTER — Telehealth: Payer: Self-pay

## 2014-11-28 NOTE — Telephone Encounter (Signed)
Pt was on arimadex and was stopped d/t arthritis exacerbation in her hands. She was switched to letrozole to see if she could tolerate that. Her knees and thighs are more painful than when she had her knees replaced. Is having a hard time standing up from sitting down for meal. She has pain from thighs to knees. She wants to continue the medication until she hears from [facility] NP.  Her f/u appt is 2/23

## 2014-11-29 ENCOUNTER — Telehealth: Payer: Self-pay | Admitting: *Deleted

## 2014-11-29 NOTE — Telephone Encounter (Signed)
Returned call to pt concerning the increased  joint pain she has been experiencing with letrozole. Pt says the pain is excruciating to her thighs and legs. She also mentioned her thumb has felt sore  and "sometimes the joint feels like it's going to pop out". Advised pt to stop medication as of today per Christeen Douglas. Pt will see [place] on Feb.23,2016. Pt was agreeable to this plan and thanked [place] for returning the call. Message to be forwarded to [facility].

## 2014-12-12 ENCOUNTER — Encounter: Payer: Self-pay | Admitting: Nurse Practitioner

## 2014-12-12 NOTE — Progress Notes (Signed)
Patient left message about needing info from billing. I called and gave her the ph# to call

## 2014-12-18 ENCOUNTER — Ambulatory Visit (HOSPITAL_BASED_OUTPATIENT_CLINIC_OR_DEPARTMENT_OTHER): Payer: Commercial Managed Care - HMO | Admitting: Nurse Practitioner

## 2014-12-18 ENCOUNTER — Encounter: Payer: Self-pay | Admitting: Nurse Practitioner

## 2014-12-18 ENCOUNTER — Other Ambulatory Visit (HOSPITAL_BASED_OUTPATIENT_CLINIC_OR_DEPARTMENT_OTHER): Payer: Commercial Managed Care - HMO

## 2014-12-18 ENCOUNTER — Telehealth: Payer: Self-pay | Admitting: Nurse Practitioner

## 2014-12-18 VITALS — BP 156/72 | HR 51 | Temp 97.6°F | Resp 20 | Wt 165.6 lb

## 2014-12-18 DIAGNOSIS — C50919 Malignant neoplasm of unspecified site of unspecified female breast: Secondary | ICD-10-CM

## 2014-12-18 DIAGNOSIS — Z170 Estrogen receptor positive status [ER+]: Secondary | ICD-10-CM

## 2014-12-18 DIAGNOSIS — C50412 Malignant neoplasm of upper-outer quadrant of left female breast: Secondary | ICD-10-CM

## 2014-12-18 DIAGNOSIS — M2550 Pain in unspecified joint: Secondary | ICD-10-CM

## 2014-12-18 LAB — COMPREHENSIVE METABOLIC PANEL (CC13)
ALT: 21 U/L (ref 0–55)
AST: 26 U/L (ref 5–34)
Albumin: 3.8 g/dL (ref 3.5–5.0)
Alkaline Phosphatase: 61 U/L (ref 40–150)
Anion Gap: 10 mEq/L (ref 3–11)
BUN: 25.6 mg/dL (ref 7.0–26.0)
CO2: 27 mEq/L (ref 22–29)
Calcium: 9.5 mg/dL (ref 8.4–10.4)
Chloride: 101 mEq/L (ref 98–109)
Creatinine: 1.4 mg/dL — ABNORMAL HIGH (ref 0.6–1.1)
EGFR: 42 mL/min/{1.73_m2} — ABNORMAL LOW (ref >90)
Glucose: 96 mg/dl (ref 70–140)
Potassium: 4.5 mEq/L (ref 3.5–5.1)
Sodium: 138 mEq/L (ref 136–145)
Total Bilirubin: 0.31 mg/dL (ref 0.20–1.20)
Total Protein: 7 g/dL (ref 6.4–8.3)

## 2014-12-18 LAB — CBC WITH DIFFERENTIAL/PLATELET
BASO%: 0.5 % (ref 0.0–2.0)
Basophils Absolute: 0 10*3/uL (ref 0.0–0.1)
EOS%: 2.9 % (ref 0.0–7.0)
Eosinophils Absolute: 0.2 10*3/uL (ref 0.0–0.5)
HCT: 32.8 % — ABNORMAL LOW (ref 34.8–46.6)
HGB: 10.7 g/dL — ABNORMAL LOW (ref 11.6–15.9)
LYMPH%: 41.1 % (ref 14.0–49.7)
MCH: 31.4 pg (ref 25.1–34.0)
MCHC: 32.6 g/dL (ref 31.5–36.0)
MCV: 96.2 fL (ref 79.5–101.0)
MONO#: 0.5 10*3/uL (ref 0.1–0.9)
MONO%: 8 % (ref 0.0–14.0)
NEUT#: 2.9 10*3/uL (ref 1.5–6.5)
NEUT%: 47.5 % (ref 38.4–76.8)
Platelets: 198 10*3/uL (ref 145–400)
RBC: 3.41 10*6/uL — ABNORMAL LOW (ref 3.70–5.45)
RDW: 13 % (ref 11.2–14.5)
WBC: 6.1 10*3/uL (ref 3.9–10.3)
lymph#: 2.5 10*3/uL (ref 0.9–3.3)

## 2014-12-18 MED ORDER — TRAMADOL HCL 50 MG PO TABS: 50.0000 mg | ORAL_TABLET | Freq: Four times a day (QID) | ORAL | Status: DC | PRN

## 2014-12-18 MED ORDER — TAMOXIFEN CITRATE 20 MG PO TABS: 20.0000 mg | ORAL_TABLET | Freq: Every day | ORAL | Status: DC

## 2014-12-18 NOTE — Progress Notes (Signed)
[name]  [phone] Fax:(336) [phone]     ID: [name] OB: January 02, 1936  MR#: [id number]  DHR#:[id number]  PCP: Cathlean Cower, MD GYN:  [name], MD SU: [name], MD OTHER MD: [name], MD; [name], MD; [name], MD  CHIEF COMPLAINT:  Hx of Left Breast Cancer CURRENT TREATMENT: anastrozole (on hold)   BREAST CANCER HISTORY: The patient underwent routine screening mammography at [facility] 10/09/2013 showing a 1 cm cluster of calcifications in the right breast at 5:00. There was also a 9 mm irregular density in the left breast laterally. Bilateral diagnostic mammography with tomography at [facility] 11/01/2013 confirmed a 1 cm cluster of vascular calcifications at the 5:00 position of the right breast, which were felt to be stable. However in the left breast there was a new 1.1 cm mass with spiculated margins. This was at 1:00, 8 cm from the nipple. Ultrasound of the left breast the same day showed the mass to measure 6 mm and to be hypoechoic. The left axilla was unremarkable.  Biopsy of the left breast mass in question 11/06/2013 showed ([id number]) and invasive ductal carcinoma, grade 2, estrogen receptor 100% positive, progesterone receptor 95% positive, both with strong staining intensity, with an MIB-1 of 19% and no HER-2 amplification, the signals ratio being 0.95 and the number per cell 2.00. E-cadherin was positive.  The patient's subsequent history is as detailed below  INTERVAL HISTORY: [name] returns alone today for follow up of her left breast cancer. Since her last visit, she was switched from anastrozole to letrozole, but unfortunately, the results were not much better. She has a history of rheumatoid arthritis, and continued to have severe pain to her hands, lower back, an knees. She called the office earlier this month and was told to stop the drug. Since then, her pain has lessened some, but it will always be present in  some form given her history. She meets with Dr. Rolena Infante soon, an orthopedic physician, for pain management solutions.   REVIEW OF SYSTEMS: [name] denies fevers, chills, nausea, vomiting, or changes in bowel or bladder habits. She continues on omeprazole for her GERD. She is eating and drinking well. She has some shortness of breath with exertion, but denies cough, palpitations, or fatigue. She denies headaches, dizziness, unexplained weight loss, or fatigue. A detailed review of systems is otherwise stable.  PAST MEDICAL HISTORY: Past Medical History  Diagnosis Date  . Colon polyp   . Chronic rhinitis   . Morbid obesity   . DJD (degenerative joint disease)   . Hypertension   . Chronic renal insufficiency   . Memory loss   . Anemia   . GERD (gastroesophageal reflux disease)   . Wears glasses   . Full dentures   . Impaired glucose tolerance 07/18/2014  . Breast cancer of upper-outer quadrant of left female breast 11/08/2013    ER/PR+ Her2- Left IDC     PAST SURGICAL HISTORY: Past Surgical History  Procedure Laterality Date  . Cataract extraction  2009    rt  . Vesicovaginal fistula closure w/ tah  1980  . Total knee arthroplasty  2002    rt  . Total knee arthroplasty  2003    left  . Abdominal hysterectomy    . Tonsillectomy    . Knee arthroscopy      both  . Colonoscopy    . Breast lumpectomy with needle localization and axillary sentinel lymph node bx Left 12/04/2013  Procedure: BREAST LUMPECTOMY WITH NEEDLE LOCALIZATION AND AXILLARY SENTINEL LYMPH NODE BX;  Surgeon: David H Newman, MD;  Location: [facility];  Service: General;  Laterality: Left;   status post hysterectomy with bilateral sloping oophorectomy  FAMILY HISTORY Family History  Problem Relation Age of Onset  . Heart disease      remote family  . Lung cancer Brother     was a smoker  . Colon cancer Mother   . Colon cancer Maternal Aunt    the patient's mother died at the age of 83, with  a history of colon cancer. The patient's father died at the age of 82 with "stomach cancer". The patient had 5 brothers, 3 sisters. One brother had lung cancer diagnosed at age 62. One maternal aunt had colon cancer at age 55. There is no history of breast or ovarian cancer in the family to her knowledge.  GYNECOLOGIC HISTORY:   (Reviewed 03/28/2014) Menarche age 16, first live birth age 24, the patient is GX P2. She underwent hysterectomy with bilateral salpingo-oophorectomy approximately 34 years ago. She did not use hormone replacement. She did use birth control remotely, for about 15 years, with no complications.  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 She is widowed, lives alone with no pets. Son [name] died from a myocardial infarction at the age of 53. Daughter [name] works in [place] as an assistant principal (at [employer]). The patient has 2 grandchildren and one great-grandchild. She is a [employer].    ADVANCED DIRECTIVES: In place; the patient's daughter Felicia is her healthcare power of attorney. Felicia can be reached at [phone] (cell), and [phone] (work).   HEALTH MAINTENANCE:  (Updated 03/28/2014) History  Substance Use Topics  . Smoking status: Never Smoker   . Smokeless tobacco: Never Used  . Alcohol Use: No    Colonoscopy:  Oct 2011/Dr. Perry  PAP: s/p hysterectomy   Bone density: 03/15/2013 at [facility] [place], shows normal bone density  Lipid panel:  April 2015/Dr. John   Allergies  Allergen Reactions  . Iron     REACTION: hives/whelps/bad constipation    Current Outpatient Prescriptions  Medication Sig Dispense Refill  . aspirin 81 MG tablet Take 81 mg by mouth daily.      . Cholecalciferol (VITAMIN D3) 2000 UNITS TABS Take 1 tablet by mouth daily.     . famotidine (PEPCID) 20 MG tablet Take 20 mg by mouth at bedtime.    . fexofenadine  (ALLEGRA) 180 MG tablet Take 180 mg by mouth daily as needed.      . glucosamine-chondroitin 500-400 MG tablet Take 1 tablet by mouth daily.     . losartan-hydrochlorothiazide (HYZAAR) 100-12.5 MG per tablet Take 1 tablet by mouth daily. 90 tablet 3  . Multiple Vitamins-Minerals (CENTRUM SILVER PO) Take 1 tablet by mouth daily.      . omeprazole (PRILOSEC) 20 MG capsule Take 20 mg by mouth daily.    . Acetaminophen (TYLENOL ARTHRITIS PAIN PO) Take by mouth. Take as directed     . letrozole (FEMARA) 2.5 MG tablet Take 1 tablet (2.5 mg total) by mouth daily. (Patient not taking: Reported on 12/18/2014) 30 tablet 2  . Simethicone (GAS-X PO) Take by mouth. Take as directed     . tamoxifen (NOLVADEX) 20 MG tablet Take 1 tablet (20 mg total) by mouth daily. 30 tablet 3  .   traMADol (ULTRAM) 50 MG tablet Take 1 tablet (50 mg total) by mouth every 6 (six) hours as needed. 30 tablet 2   No current facility-administered medications for this visit.    OBJECTIVE: Older African American woman appears comfortable  in no acute distress Filed Vitals:   12/18/14 1335  BP: 164/78  Pulse: 51  Temp: 97.6 F (36.4 C)  Resp: 20     Body mass index is 30.28 kg/(m^2).    ECOG FS: 1 Filed Weights   12/18/14 1335  Weight: 165 lb 9.6 oz (75.116 kg)   Physical Exam: Skin: warm, dry  HEENT: sclerae anicteric, conjunctivae pink, oropharynx clear. No thrush or mucositis.  Lymph Nodes: No cervical or supraclavicular lymphadenopathy  Lungs: clear to auscultation bilaterally, no rales, wheezes, or rhonci  Heart: regular rate and rhythm  Abdomen: round, soft, non tender, positive bowel sounds  Musculoskeletal: No focal spinal tenderness, no peripheral edema  Neuro: non focal, well oriented, positive affect  Breasts: left breast status post lumpectomy . No evidence of recurrent disease. Left axilla benign. Right breast unremarkable.  LAB RESULTS:   Lab Results  Component Value Date   WBC 6.1 12/18/2014    NEUTROABS 2.9 12/18/2014   HGB 10.7* 12/18/2014   HCT 32.8* 12/18/2014   MCV 96.2 12/18/2014   PLT 198 12/18/2014      Chemistry      Component Value Date/Time   NA 138 12/18/2014 1302   NA 131* 11/27/2014 1207   K 4.5 12/18/2014 1302   K 4.4 11/27/2014 1207   CL 97 11/27/2014 1207   CO2 27 12/18/2014 1302   CO2 30 11/27/2014 1207   BUN 25.6 12/18/2014 1302   BUN 24* 11/27/2014 1207   CREATININE 1.4* 12/18/2014 1302   CREATININE 1.19 11/27/2014 1207      Component Value Date/Time   CALCIUM 9.5 12/18/2014 1302   CALCIUM 9.9 11/27/2014 1207   ALKPHOS 61 12/18/2014 1302   ALKPHOS 56 11/27/2014 1207   AST 26 12/18/2014 1302   AST 22 11/27/2014 1207   ALT 21 12/18/2014 1302   ALT 18 11/27/2014 1207   BILITOT 0.31 12/18/2014 1302   BILITOT 0.4 11/27/2014 1207       STUDIES:  Most recent bone density at [facility] on 03/15/2013 was normal.   Most recent mammogram on 09/18/14 at [facility] showed regional calcifications in the left breast that are likely benign. A follow up mammogram in 6 months is advised.   ASSESSMENT: 79 y.o. [place] woman status post left breast biopsy 11/06/2013 for a clinical T1n N0, stage IA invasive ductal carcinoma, grade 2, estrogen receptor 100% positive, progesterone receptor 95% positive, with an MIB-1 of 19% and no HER-2 amplification  (1) status post left lumpectomy and sentinel lymph node sampling 12/04/2013 for a pT1c pN0, stage IA invasive ductal carcinoma, grade 1, repeat HER-2 again negative, with ample margins  (2) anastrozole Mar 2015-Dec 2015, letrozole Jan 2016-Feb 2016. Starts tamoxifen March 2016  (3) foregoing radiation therapy   PLAN: Braylie and I spent about 30 minutes discussing anti-estrogen therapies. At this time, it is sensible to move on to tamoxifen, as the aromatase inhibitors are not treating her well. We discussed the risks and benefits of tamoxifen, and while the prospect of blood clots make  her nervous, she understands that risk is similar to birth control and she was on some form of this for over 10 years with no incidents. She will not start the tamoxifen until   3/1.  I prescribed her some tramadol for her hand and back pain until she can meet with Dr. Brooks for a better plan.   Tymika will return in 3 months for labs and a follow up visit. She understands and agrees with this plan. She knows the goal of treatment in her case is cure. She has been encouraged to call with any issues that might arise before her next visit here.  Ferrell, Heather L, NP   12/18/2014 2:36 PM 

## 2014-12-18 NOTE — Addendum Note (Signed)
Addended by: Marcelino Duster on: 12/18/2014 04:18 PM   Modules accepted: Orders

## 2014-12-18 NOTE — Telephone Encounter (Signed)
appts made and avs printed for pt °

## 2015-01-01 ENCOUNTER — Telehealth: Payer: Self-pay | Admitting: *Deleted

## 2015-01-01 ENCOUNTER — Other Ambulatory Visit: Payer: Self-pay | Admitting: *Deleted

## 2015-01-01 MED ORDER — TAMOXIFEN CITRATE 20 MG PO TABS: 20.0000 mg | ORAL_TABLET | Freq: Every day | ORAL | Status: DC

## 2015-01-01 MED ORDER — TRAMADOL HCL 50 MG PO TABS: 50.0000 mg | ORAL_TABLET | Freq: Four times a day (QID) | ORAL | Status: DC | PRN

## 2015-01-01 NOTE — Telephone Encounter (Signed)
This RN spoke with pt per her call stating she has not received the new medication nor the tramadol from [facility] mail order pharmacy.  " that was almost 2 weeks ago "  This RN reviewed prescriptions per 12/18/2014 visit and noted tamoxifen was e-scribed to the [place] [facility].  The tramadol prescription was printed- no notes stating if prescription was called in or faxed ( tramadol cannot be e-scribed ).  Pt states she does not use the above [facility] but has used the one on [address]- but that has been a while- I have been using [facility] now "  This RN e-scribed the tamoxifen per order from 2/23 to [facility]. Also informed pt she can pick up a local prescription from the [facility] to start now due to her statement " I am concerned that I am not on an anticancer medication "  Per the tramadol prescription pt states she would like it to be sent to [facility].  Tramadol prescription printed and faxed to [facility].  At present pt has no other questions or concerns.

## 2015-01-01 NOTE — Telephone Encounter (Signed)
No entry- see prior for this date

## 2015-01-16 ENCOUNTER — Other Ambulatory Visit: Payer: Self-pay | Admitting: Internal Medicine

## 2015-01-16 NOTE — Telephone Encounter (Signed)
Done hardcopy to Cherina  

## 2015-01-17 NOTE — Telephone Encounter (Signed)
Rx done. 

## 2015-03-05 ENCOUNTER — Telehealth: Payer: Self-pay | Admitting: Hematology and Oncology

## 2015-03-05 ENCOUNTER — Encounter: Payer: Commercial Managed Care - HMO | Admitting: Internal Medicine

## 2015-03-05 ENCOUNTER — Other Ambulatory Visit (HOSPITAL_BASED_OUTPATIENT_CLINIC_OR_DEPARTMENT_OTHER): Payer: Commercial Managed Care - HMO

## 2015-03-05 ENCOUNTER — Ambulatory Visit (HOSPITAL_BASED_OUTPATIENT_CLINIC_OR_DEPARTMENT_OTHER): Payer: Commercial Managed Care - HMO | Admitting: Nurse Practitioner

## 2015-03-05 ENCOUNTER — Encounter: Payer: Self-pay | Admitting: Nurse Practitioner

## 2015-03-05 VITALS — BP 164/72 | HR 60 | Temp 98.1°F | Resp 18 | Ht 62.0 in | Wt 161.6 lb

## 2015-03-05 DIAGNOSIS — C50412 Malignant neoplasm of upper-outer quadrant of left female breast: Secondary | ICD-10-CM

## 2015-03-05 DIAGNOSIS — Z170 Estrogen receptor positive status [ER+]: Secondary | ICD-10-CM | POA: Diagnosis not present

## 2015-03-05 LAB — COMPREHENSIVE METABOLIC PANEL (CC13)
ALT: 18 U/L (ref 0–55)
AST: 23 U/L (ref 5–34)
Albumin: 3.4 g/dL — ABNORMAL LOW (ref 3.5–5.0)
Alkaline Phosphatase: 57 U/L (ref 40–150)
Anion Gap: 11 mEq/L (ref 3–11)
BUN: 21.3 mg/dL (ref 7.0–26.0)
CO2: 24 mEq/L (ref 22–29)
Calcium: 8.9 mg/dL (ref 8.4–10.4)
Chloride: 100 mEq/L (ref 98–109)
Creatinine: 1.3 mg/dL — ABNORMAL HIGH (ref 0.6–1.1)
EGFR: 47 mL/min/{1.73_m2} — ABNORMAL LOW (ref >90)
Glucose: 91 mg/dl (ref 70–140)
Potassium: 4.1 mEq/L (ref 3.5–5.1)
Sodium: 135 mEq/L — ABNORMAL LOW (ref 136–145)
Total Bilirubin: 0.42 mg/dL (ref 0.20–1.20)
Total Protein: 6.6 g/dL (ref 6.4–8.3)

## 2015-03-05 LAB — CBC WITH DIFFERENTIAL/PLATELET
BASO%: 1.3 % (ref 0.0–2.0)
Basophils Absolute: 0.1 10*3/uL (ref 0.0–0.1)
EOS%: 2.8 % (ref 0.0–7.0)
Eosinophils Absolute: 0.2 10*3/uL (ref 0.0–0.5)
HCT: 31.9 % — ABNORMAL LOW (ref 34.8–46.6)
HGB: 10.6 g/dL — ABNORMAL LOW (ref 11.6–15.9)
LYMPH%: 27.4 % (ref 14.0–49.7)
MCH: 31.6 pg (ref 25.1–34.0)
MCHC: 33.3 g/dL (ref 31.5–36.0)
MCV: 94.9 fL (ref 79.5–101.0)
MONO#: 0.6 10*3/uL (ref 0.1–0.9)
MONO%: 10 % (ref 0.0–14.0)
NEUT#: 3.2 10*3/uL (ref 1.5–6.5)
NEUT%: 58.5 % (ref 38.4–76.8)
Platelets: 223 10*3/uL (ref 145–400)
RBC: 3.36 10*6/uL — ABNORMAL LOW (ref 3.70–5.45)
RDW: 13.2 % (ref 11.2–14.5)
WBC: 5.5 10*3/uL (ref 3.9–10.3)
lymph#: 1.5 10*3/uL (ref 0.9–3.3)

## 2015-03-05 NOTE — Telephone Encounter (Signed)
Appointments made and avs

## 2015-03-05 NOTE — Progress Notes (Signed)
[place]  Telephone:(336) [phone] Fax:(336) [phone]     ID: [name] OB: 1936-07-07  MR#: [id number]  OQH#:[id number]  PCP: Cathlean Cower, MD GYN:  [name], MD SU: [name], MD OTHER MD: [name], MD; [name], MD; [name], MD  CHIEF COMPLAINT:  Hx of Left Breast Cancer CURRENT TREATMENT: anastrozole (on hold)   BREAST CANCER HISTORY: The patient underwent routine screening mammography at [facility] 10/09/2013 showing a 1 cm cluster of calcifications in the right breast at 5:00. There was also a 9 mm irregular density in the left breast laterally. Bilateral diagnostic mammography with tomography at [facility] 11/01/2013 confirmed a 1 cm cluster of vascular calcifications at the 5:00 position of the right breast, which were felt to be stable. However in the left breast there was a new 1.1 cm mass with spiculated margins. This was at 1:00, 8 cm from the nipple. Ultrasound of the left breast the same day showed the mass to measure 6 mm and to be hypoechoic. The left axilla was unremarkable.  Biopsy of the left breast mass in question 11/06/2013 showed ([id number]) and invasive ductal carcinoma, grade 2, estrogen receptor 100% positive, progesterone receptor 95% positive, both with strong staining intensity, with an MIB-1 of 19% and no HER-2 amplification, the signals ratio being 0.95 and the number per cell 2.00. E-cadherin was positive.  The patient's subsequent history is as detailed below  INTERVAL HISTORY: [name] returns today for follow up of her left breast cancer. She was placed on tamoxifen at her last visit, and she started this on March 1st. So far is tolerating this better than either of the aromatase inhibitors she has been on. She denies hot flashes or vaginal changes. The extraordinary pain she had to her hands, lower back, and knees is no longer as severe. She uses tramadol PRN and this is effective. The interval history is otherwise  unremarkable.    REVIEW OF SYSTEMS: [name] denies fevers, chills, nausea, vomiting, or changes in bowel or bladder habits. She continues on omeprazole for her GERD. She is eating and drinking well. She has some shortness of breath with exertion, but denies cough, palpitations, or fatigue. She denies headaches, dizziness, unexplained weight loss, or fatigue. A detailed review of systems is otherwise stable.  PAST MEDICAL HISTORY: Past Medical History  Diagnosis Date  . Colon polyp   . Chronic rhinitis   . Morbid obesity   . DJD (degenerative joint disease)   . Hypertension   . Chronic renal insufficiency   . Memory loss   . Anemia   . GERD (gastroesophageal reflux disease)   . Wears glasses   . Full dentures   . Impaired glucose tolerance 07/18/2014  . Breast cancer of upper-outer quadrant of left female breast 11/08/2013    ER/PR+ Her2- Left IDC     PAST SURGICAL HISTORY: Past Surgical History  Procedure Laterality Date  . Cataract extraction  2009    rt  . Vesicovaginal fistula closure w/ tah  1980  . Total knee arthroplasty  2002    rt  . Total knee arthroplasty  2003    left  . Abdominal hysterectomy    . Tonsillectomy    . Knee arthroscopy      both  . Colonoscopy    . Breast lumpectomy with needle localization and axillary sentinel lymph node bx Left 12/04/2013    Procedure: BREAST LUMPECTOMY WITH NEEDLE LOCALIZATION AND AXILLARY SENTINEL LYMPH NODE BX;  Surgeon: Shanon Brow  Bary Leriche, MD;  Location: [facility];  Service: General;  Laterality: Left;   status post hysterectomy with bilateral sloping oophorectomy  FAMILY HISTORY Family History  Problem Relation Age of Onset  . Heart disease      remote family  . Lung cancer Brother     was a smoker  . Colon cancer Mother   . Colon cancer Maternal Aunt    the patient's mother died at the age of 68, with a history of colon cancer. The patient's father died at the age of 61 with "stomach cancer". The  patient had 5 brothers, 3 sisters. One brother had lung cancer diagnosed at age 15. One maternal aunt had colon cancer at age 45. There is no history of breast or ovarian cancer in the family to her knowledge.  GYNECOLOGIC HISTORY:   (Reviewed 03/28/2014) Menarche age 73, first live birth age 40, the patient is [name] P2. She underwent hysterectomy with bilateral salpingo-oophorectomy approximately 34 years ago. She did not use hormone replacement. She did use birth control remotely, for about 15 years, with no complications.  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 She is widowed, lives alone with no pets. Son [name] died from a myocardial infarction at the age of 70. Daughter [name] works in [place] as an Microbiologist (at [employer]). The patient has 2 grandchildren and one great-grandchild. She is a Psychologist, forensic.    ADVANCED DIRECTIVES: In place; the patient's daughter [name] is her healthcare power of attorney. [name] can be reached at [phone] (cell), and [phone] (work).   HEALTH MAINTENANCE:  (Updated 03/28/2014) History  Substance Use Topics  . Smoking status: Never Smoker   . Smokeless tobacco: Never Used  . Alcohol Use: No    Colonoscopy:  Oct 2011/DrHenrene Pastor  PAP: s/p hysterectomy   Bone density: 03/15/2013 at [facility] [place], shows normal bone density  Lipid panel:  April 2015/Dr. John   Allergies  Allergen Reactions  . Iron     REACTION: hives/whelps/bad constipation    Current Outpatient Prescriptions  Medication Sig Dispense Refill  . Acetaminophen (TYLENOL ARTHRITIS PAIN PO) Take by mouth. Take as directed     . aspirin 81 MG tablet Take 81 mg by mouth daily.      [name] atropine 1 % ophthalmic solution     . Cholecalciferol (VITAMIN D3) 2000 UNITS TABS Take 1 tablet by mouth daily.     . famotidine (PEPCID) 20 MG tablet Take 20 mg by mouth at  bedtime.    . fexofenadine (ALLEGRA) 180 MG tablet Take 180 mg by mouth daily as needed.      [name] glucosamine-chondroitin 500-400 MG tablet Take 1 tablet by mouth daily.     [name] losartan-hydrochlorothiazide (HYZAAR) 100-12.5 MG per tablet Take 1 tablet by mouth daily. 90 tablet 3  . Multiple Vitamins-Minerals (CENTRUM SILVER PO) Take 1 tablet by mouth daily.      [name] omeprazole (PRILOSEC) 20 MG capsule Take 20 mg by mouth daily.    . tamoxifen (NOLVADEX) 20 MG tablet Take 1 tablet (20 mg total) by mouth daily. 90 tablet 3  . traMADol (ULTRAM) 50 MG tablet TAKE 1 TABLET EVERY 6 HOURS AS NEEDED 120 tablet 2  . Simethicone (GAS-X PO) Take by mouth. Take as directed      No current facility-administered medications for this visit.    OBJECTIVE:  Older African American woman appears comfortable  in no acute distress Filed Vitals:   03/05/15 1042  BP: 164/72  Pulse: 60  Temp: 98.1 F (36.7 C)  Resp: 18     Body mass index is 29.55 kg/(m^2).    ECOG FS: 1 Filed Weights   03/05/15 1042  Weight: 161 lb 9.6 oz (73.301 kg)   Physical Exam: Skin: warm, dry  HEENT: sclerae anicteric, conjunctivae pink, oropharynx clear. No thrush or mucositis.  Lymph Nodes: No cervical or supraclavicular lymphadenopathy  Lungs: clear to auscultation bilaterally, no rales, wheezes, or rhonci  Heart: regular rate and rhythm  Abdomen: round, soft, non tender, positive bowel sounds  Musculoskeletal: No focal spinal tenderness, no peripheral edema  Neuro: non focal, well oriented, positive affect  Breasts: left breast status post lumpectomy . No evidence of recurrent disease. Left axilla benign. Right breast unremarkable.  LAB RESULTS:   Lab Results  Component Value Date   WBC 5.5 03/05/2015   NEUTROABS 3.2 03/05/2015   HGB 10.6* 03/05/2015   HCT 31.9* 03/05/2015   MCV 94.9 03/05/2015   PLT 223 03/05/2015      Chemistry      Component Value Date/Time   NA 138 12/18/2014 1302   NA 131* 11/27/2014 1207    K 4.5 12/18/2014 1302   K 4.4 11/27/2014 1207   CL 97 11/27/2014 1207   CO2 27 12/18/2014 1302   CO2 30 11/27/2014 1207   BUN 25.6 12/18/2014 1302   BUN 24* 11/27/2014 1207   CREATININE 1.4* 12/18/2014 1302   CREATININE 1.19 11/27/2014 1207      Component Value Date/Time   CALCIUM 9.5 12/18/2014 1302   CALCIUM 9.9 11/27/2014 1207   ALKPHOS 61 12/18/2014 1302   ALKPHOS 56 11/27/2014 1207   AST 26 12/18/2014 1302   AST 22 11/27/2014 1207   ALT 21 12/18/2014 1302   ALT 18 11/27/2014 1207   BILITOT 0.31 12/18/2014 1302   BILITOT 0.4 11/27/2014 1207       STUDIES:  Most recent [facility] density at [facility] on 03/15/2013 was normal.   Most recent mammogram on 09/18/14 at [facility] showed regional calcifications in the left breast that are likely benign. A follow up mammogram in 6 months is advised.   ASSESSMENT: 79 y.o. [place] woman status post left breast biopsy 11/06/2013 for a clinical T1n N0, stage IA invasive ductal carcinoma, grade 2, estrogen receptor 100% positive, progesterone receptor 95% positive, with an MIB-1 of 19% and no HER-2 amplification  (1) status post left lumpectomy and sentinel lymph node sampling 12/04/2013 for a pT1c pN0, stage IA invasive ductal carcinoma, grade 1, repeat HER-2 again negative, with ample margins  (2) anastrozole Mar 2015-Dec 2015, letrozole Jan 2016-Feb 2016. Started tamoxifen March 2016  (3) foregoing radiation therapy   PLAN: [name] is doing well as far as her antiestrogen therapy is concerned. The tamoxifen seems to be a better fit than the aromatase inhibitors she has tried. She will continue this drug for at least another 4 years of antiestrogen therapy.   [name] meets with her PCP Dr. Jenny Reichmann later this month, and he will address her elevated blood pressure. She is already on hyzaar daily.   She will have a repeat mammogram in June, then she will return for labs and a follow up visit in 6 months. She  understands and agrees with this plan. She knows the goal of treatment in her case is cure. She has been encouraged to  call with any issues that might arise before her next visit here.    Laurie Panda, NP   03/05/2015 11:13 AM

## 2015-03-05 NOTE — Addendum Note (Signed)
Addended by: Marcelino Duster on: 03/05/2015 01:17 PM   Modules accepted: Orders

## 2015-03-15 ENCOUNTER — Ambulatory Visit (INDEPENDENT_AMBULATORY_CARE_PROVIDER_SITE_OTHER): Payer: Commercial Managed Care - HMO | Admitting: Internal Medicine

## 2015-03-15 ENCOUNTER — Encounter: Payer: Self-pay | Admitting: Internal Medicine

## 2015-03-15 ENCOUNTER — Encounter (INDEPENDENT_AMBULATORY_CARE_PROVIDER_SITE_OTHER): Payer: Commercial Managed Care - HMO

## 2015-03-15 VITALS — BP 162/78 | HR 53 | Temp 97.5°F | Wt 159.0 lb

## 2015-03-15 DIAGNOSIS — R001 Bradycardia, unspecified: Secondary | ICD-10-CM

## 2015-03-15 DIAGNOSIS — I10 Essential (primary) hypertension: Secondary | ICD-10-CM | POA: Diagnosis not present

## 2015-03-15 DIAGNOSIS — R7302 Impaired glucose tolerance (oral): Secondary | ICD-10-CM

## 2015-03-15 DIAGNOSIS — Z0000 Encounter for general adult medical examination without abnormal findings: Secondary | ICD-10-CM

## 2015-03-15 DIAGNOSIS — Z86010 Personal history of colonic polyps: Secondary | ICD-10-CM

## 2015-03-15 LAB — TSH: TSH: 2.89 u[IU]/mL (ref 0.35–4.50)

## 2015-03-15 LAB — HEMOGLOBIN A1C: Hgb A1c MFr Bld: 5.3 % (ref 4.6–6.5)

## 2015-03-15 LAB — LIPID PANEL
Cholesterol: 147 mg/dL (ref 0–200)
HDL: 48.5 mg/dL (ref >39.00)
LDL Cholesterol: 81 mg/dL (ref 0–99)
NonHDL: 98.5
Total CHOL/HDL Ratio: 3
Triglycerides: 87 mg/dL (ref 0.0–149.0)
VLDL: 17.4 mg/dL (ref 0.0–40.0)

## 2015-03-15 MED ORDER — AMLODIPINE BESYLATE 5 MG PO TABS: 5.0000 mg | ORAL_TABLET | Freq: Every day | ORAL | Status: DC

## 2015-03-15 NOTE — Progress Notes (Signed)
Pre visit review using our clinic review tool, if applicable. No additional management support is needed unless otherwise documented below in the visit note. 

## 2015-03-15 NOTE — Progress Notes (Signed)
Subjective:    Patient ID: [name], female    DOB: 1936-02-03, 79 y.o.   MRN: [id number]  HPI   Here for wellness and f/u;  Overall doing ok;  Pt denies Chest pain, worsening SOB, DOE, wheezing, orthopnea, PND, worsening LE edema, palpitations, dizziness or syncope.  Pt denies neurological change such as new headache, facial or extremity weakness.  Pt denies polydipsia, polyuria, or low sugar symptoms. Pt states overall good compliance with treatment and medications, good tolerability, and has been trying to follow appropriate diet.  Pt denies worsening depressive symptoms, suicidal ideation or panic. No fever, night sweats, wt loss, loss of appetite, or other constitutional symptoms.  Pt states good ability with ADL's, has low fall risk, home safety reviewed and adequate, no other significant changes in hearing or vision, and only occasionally active with exercise. Does have several wks ongoing nasal allergy symptoms with clearish congestion, itch and sneezing, without fever, pain, ST, cough, swelling, but occas wheezing.  Due for f/u colonoscopy later this yr. Past Medical History  Diagnosis Date  . Colon polyp   . Chronic rhinitis   . Morbid obesity   . DJD (degenerative joint disease)   . Hypertension   . Chronic renal insufficiency   . Memory loss   . Anemia   . GERD (gastroesophageal reflux disease)   . Wears glasses   . Full dentures   . Impaired glucose tolerance 07/18/2014  . Breast cancer of upper-outer quadrant of left female breast 11/08/2013    ER/PR+ Her2- Left IDC    Past Surgical History  Procedure Laterality Date  . Cataract extraction  2009    rt  . Vesicovaginal fistula closure w/ tah  1980  . Total knee arthroplasty  2002    rt  . Total knee arthroplasty  2003    left  . Abdominal hysterectomy    . Tonsillectomy    . Knee arthroscopy      both  . Colonoscopy    . Breast lumpectomy with needle localization and axillary sentinel lymph node bx Left  12/04/2013    Procedure: BREAST LUMPECTOMY WITH NEEDLE LOCALIZATION AND AXILLARY SENTINEL LYMPH NODE BX;  Surgeon: Kandis Cocking, MD;  Location: [facility];  Service: General;  Laterality: Left;    reports that she has never smoked. She has never used smokeless tobacco. She reports that she does not drink alcohol or use illicit drugs. family history includes Colon cancer in her maternal aunt and mother; Heart disease in an other family member; Lung cancer in her brother. Allergies  Allergen Reactions  . Iron     REACTION: hives/whelps/bad constipation   Current Outpatient Prescriptions on File Prior to Visit  Medication Sig Dispense Refill  . Acetaminophen (TYLENOL ARTHRITIS PAIN PO) Take by mouth. Take as directed     . aspirin 81 MG tablet Take 81 mg by mouth daily.      [name] atropine 1 % ophthalmic solution     . Cholecalciferol (VITAMIN D3) 2000 UNITS TABS Take 1 tablet by mouth daily.     . famotidine (PEPCID) 20 MG tablet Take 20 mg by mouth at bedtime.    . fexofenadine (ALLEGRA) 180 MG tablet Take 180 mg by mouth daily as needed.      [name] glucosamine-chondroitin 500-400 MG tablet Take 1 tablet by mouth daily.     [name] losartan-hydrochlorothiazide (HYZAAR) 100-12.5 MG per tablet Take 1 tablet by mouth daily. 90 tablet 3  .  Multiple Vitamins-Minerals (CENTRUM SILVER PO) Take 1 tablet by mouth daily.      [name] omeprazole (PRILOSEC) 20 MG capsule Take 20 mg by mouth daily.    . Simethicone (GAS-X PO) Take by mouth. Take as directed     . tamoxifen (NOLVADEX) 20 MG tablet Take 1 tablet (20 mg total) by mouth daily. 90 tablet 3  . traMADol (ULTRAM) 50 MG tablet TAKE 1 TABLET EVERY 6 HOURS AS NEEDED 120 tablet 2   No current facility-administered medications on file prior to visit.   Review of Systems Constitutional: Negative for increased diaphoresis, other activity, appetite or siginficant weight change other than noted HENT: Negative for worsening hearing loss, ear pain, facial  swelling, mouth sores and neck stiffness.   Eyes: Negative for other worsening pain, redness or visual disturbance.  Respiratory: Negative for shortness of breath and wheezing  Cardiovascular: Negative for chest pain and palpitations.  Gastrointestinal: Negative for diarrhea, blood in stool, abdominal distention or other pain Genitourinary: Negative for hematuria, flank pain or change in urine volume.  Musculoskeletal: Negative for myalgias or other joint complaints.  Skin: Negative for color change and wound or drainage.  Neurological: Negative for syncope and numbness. other than noted Hematological: Negative for adenopathy. or other swelling Psychiatric/Behavioral: Negative for hallucinations, SI, self-injury, decreased concentration or other worsening agitation.      Objective:   Physical Exam BP 162/78 mmHg  Pulse 53  Temp(Src) 97.5 F (36.4 C) (Oral)  Wt 159 lb (72.122 kg)  SpO2 98% VS noted,  Constitutional: Pt is oriented to person, place, and time. Appears well-developed and well-nourished, in no significant distress Head: Normocephalic and atraumatic.  Right Ear: External ear normal.  Left Ear: External ear normal.  Nose: Nose normal.  Mouth/Throat: Oropharynx is clear and moist.  Eyes: Conjunctivae and EOM are normal. Pupils are equal, round, and reactive to light.  Neck: Normal range of motion. Neck supple. No JVD present. No tracheal deviation present or significant neck LA or mass Cardiovascular: Normal rate, regular rhythm, normal heart sounds and intact distal pulses.   Pulmonary/Chest: Effort normal and breath sounds without rales or wheezing  Abdominal: Soft. Bowel sounds are normal. NT. No HSM  Musculoskeletal: Normal range of motion. Exhibits no edema.  Lymphadenopathy:  Has no cervical adenopathy.  Neurological: Pt is alert and oriented to person, place, and time. Pt has normal reflexes. No cranial nerve deficit. Motor grossly intact Skin: Skin is warm and  dry. No rash noted.  Psychiatric:  Has nervous mood and affect. Behavior is normal.     Assessment & Plan:

## 2015-03-15 NOTE — Assessment & Plan Note (Signed)
stable overall by history and exam, recent data reviewed with pt, and pt to continue medical treatment as before,  to f/u any worsening symptoms or concerns Lab Results  Component Value Date   HGBA1C 5.6 11/27/2014  for a1c

## 2015-03-15 NOTE — Assessment & Plan Note (Signed)

## 2015-03-15 NOTE — Assessment & Plan Note (Signed)
Mild uncontrolled, stable overall by history and exam, recent data reviewed with pt, and pt to start amlod 5 qd,  to f/u any worsening symptoms or concerns BP Readings from Last 3 Encounters:  03/15/15 162/78  03/05/15 164/72  12/18/14 156/72

## 2015-03-15 NOTE — Patient Instructions (Addendum)
Please continue all other medications as before, and refills have been done if requested.  Please have the pharmacy call with any other refills you may need.  Please continue your efforts at being more active, low cholesterol diet, and weight control.  You are otherwise up to date with prevention measures today.  Please keep your appointments with your specialists as you may have planned  You will be contacted regarding the referral for: colonoscopy  You will be contacted regarding the referral for: echocardiogram, and cardiology  Please go to the LAB in the Basement (turn left off the elevator) for the tests to be done today  You will be contacted by phone if any changes need to be made immediately.  Otherwise, you will receive a letter about your results with an explanation, but please check with MyChart first.  Please remember to sign up for MyChart if you have not done so, as this will be important to you in the future with finding out test results, communicating by private email, and scheduling acute appointments online when needed.  Please return in 6 months, or sooner if needed

## 2015-03-19 ENCOUNTER — Ambulatory Visit: Payer: Medicare PPO | Admitting: Internal Medicine

## 2015-03-27 ENCOUNTER — Encounter: Payer: Self-pay | Admitting: Internal Medicine

## 2015-03-27 ENCOUNTER — Other Ambulatory Visit: Payer: Self-pay

## 2015-03-27 ENCOUNTER — Telehealth: Payer: Self-pay

## 2015-03-27 ENCOUNTER — Ambulatory Visit (HOSPITAL_COMMUNITY): Payer: Commercial Managed Care - HMO | Attending: Cardiovascular Disease

## 2015-03-27 DIAGNOSIS — R001 Bradycardia, unspecified: Secondary | ICD-10-CM

## 2015-03-27 DIAGNOSIS — I517 Cardiomegaly: Secondary | ICD-10-CM | POA: Insufficient documentation

## 2015-03-27 DIAGNOSIS — I071 Rheumatic tricuspid insufficiency: Secondary | ICD-10-CM | POA: Insufficient documentation

## 2015-03-27 LAB — HM MAMMOGRAPHY

## 2015-03-27 NOTE — Telephone Encounter (Signed)
Mammogram dtd 03/27/15 rcvd from [facility].  Reviewed by Dr. Jana Hakim.  Sent to scan.

## 2015-03-28 ENCOUNTER — Telehealth: Payer: Self-pay

## 2015-03-28 ENCOUNTER — Encounter: Payer: Self-pay | Admitting: Internal Medicine

## 2015-03-28 NOTE — Assessment & Plan Note (Signed)
Also for echo, card referral

## 2015-03-28 NOTE — Telephone Encounter (Signed)
sgned order for mammogram faxed to [facility].  Sent to scan

## 2015-03-28 NOTE — Addendum Note (Signed)
Addended by: Biagio Borg on: 03/28/2015 11:48 AM   Modules accepted: Miquel Dunn

## 2015-05-02 ENCOUNTER — Encounter: Payer: Self-pay | Admitting: Cardiovascular Disease

## 2015-05-02 ENCOUNTER — Ambulatory Visit (INDEPENDENT_AMBULATORY_CARE_PROVIDER_SITE_OTHER): Payer: Commercial Managed Care - HMO | Admitting: Cardiovascular Disease

## 2015-05-02 VITALS — BP 138/60 | HR 58 | Ht 62.0 in | Wt 165.0 lb

## 2015-05-02 DIAGNOSIS — I10 Essential (primary) hypertension: Secondary | ICD-10-CM

## 2015-05-02 DIAGNOSIS — R001 Bradycardia, unspecified: Secondary | ICD-10-CM

## 2015-05-02 NOTE — Patient Instructions (Signed)
Medication Instructions:  Your physician recommends that you continue on your current medications as directed. Please refer to the Current Medication list given to you today.   Labwork: None Ordered   Testing/Procedures: None Ordered   Follow-Up: Your physician recommends that you schedule a follow-up appointment in: as needed with Dr. Nahser     

## 2015-05-02 NOTE — Progress Notes (Signed)
Cardiology Office Note   Date:  05/02/2015   ID:  [name], DOB Mar 25, 1936, MRN [id number]  PCP:  Cathlean Cower, MD  Cardiologist:   Acie Fredrickson Wonda Cheng, MD   Chief Complaint  Patient presents with  . New Evaluation    bradycardia   Problem List 1. Sinus bradycardia  2. Breast cancer  3. Essential Hypertension .    History of Present Illness: [name] is a 79 y.o. female who presents for evaluation of sinus bradycardia. No syncope. No pre-syncope.  No chest pain or dyspnea.  Uses a cane for balance when walking ( for balance ) .  Does not walk for exercise  Does her own yard work.    She has occasional leg swelling.  Was started on Amlodipine 5 mg for elevated BP.  Thinks her leg edema worsened slightly 2 weeks ago .    Past Medical History  Diagnosis Date  . Colon polyp   . Chronic rhinitis   . Morbid obesity   . DJD (degenerative joint disease)   . Hypertension   . Chronic renal insufficiency   . Memory loss   . Anemia   . GERD (gastroesophageal reflux disease)   . Wears glasses   . Full dentures   . Impaired glucose tolerance 07/18/2014  . Breast cancer of upper-outer quadrant of left female breast 11/08/2013    ER/PR+ Her2- Left IDC     Past Surgical History  Procedure Laterality Date  . Cataract extraction  2009    rt  . Vesicovaginal fistula closure w/ tah  1980  . Total knee arthroplasty  2002    rt  . Total knee arthroplasty  2003    left  . Abdominal hysterectomy    . Tonsillectomy    . Knee arthroscopy      both  . Colonoscopy    . Breast lumpectomy with needle localization and axillary sentinel lymph node bx Left 12/04/2013    Procedure: BREAST LUMPECTOMY WITH NEEDLE LOCALIZATION AND AXILLARY SENTINEL LYMPH NODE BX;  Surgeon: Shann Medal, MD;  Location: [facility];  Service: General;  Laterality: Left;     Current Outpatient Prescriptions  Medication Sig Dispense Refill  . Acetaminophen (TYLENOL ARTHRITIS  PAIN PO) Take by mouth. Take as directed     . amLODipine (NORVASC) 5 MG tablet Take 1 tablet (5 mg total) by mouth daily. 90 tablet 3  . aspirin 81 MG tablet Take 81 mg by mouth daily.      [name] atropine 1 % ophthalmic solution     . Cholecalciferol (VITAMIN D3) 2000 UNITS TABS Take 1 tablet by mouth daily.     . famotidine (PEPCID) 20 MG tablet Take 20 mg by mouth at bedtime.    . fexofenadine (ALLEGRA) 180 MG tablet Take 180 mg by mouth daily as needed.      [name] glucosamine-chondroitin 500-400 MG tablet Take 1 tablet by mouth daily.     [name] losartan-hydrochlorothiazide (HYZAAR) 100-12.5 MG per tablet Take 1 tablet by mouth daily. 90 tablet 3  . Multiple Vitamins-Minerals (CENTRUM SILVER PO) Take 1 tablet by mouth daily.      [name] omeprazole (PRILOSEC) 20 MG capsule Take 20 mg by mouth daily.    . prednisoLONE acetate (PRED FORTE) 1 % ophthalmic suspension Place 1 drop into the left eye 4 (four) times daily.    . Simethicone (GAS-X PO) Take by mouth. Take as directed     . tamoxifen (  NOLVADEX) 20 MG tablet Take 1 tablet (20 mg total) by mouth daily. 90 tablet 3  . traMADol (ULTRAM) 50 MG tablet TAKE 1 TABLET EVERY 6 HOURS AS NEEDED 120 tablet 2   No current facility-administered medications for this visit.    Allergies:   Iron    Social History:  The patient  reports that she has never smoked. She has never used smokeless tobacco. She reports that she does not drink alcohol or use illicit drugs.   Family History:  The patient's family history includes Colon cancer in her maternal aunt and mother; Heart disease in an other family member; Lung cancer in her brother.    ROS:  Please see the history of present illness.    Review of Systems: Constitutional:  denies fever, chills, diaphoresis, appetite change and fatigue.  HEENT: denies photophobia, eye pain, redness, hearing loss, ear pain, congestion, sore throat, rhinorrhea, sneezing, neck pain, neck stiffness and tinnitus.  Respiratory: denies  SOB, DOE, cough, chest tightness, and wheezing.  Cardiovascular: denies chest pain, palpitations and leg swelling.  Gastrointestinal: denies nausea, vomiting, abdominal pain, diarrhea, constipation, blood in stool.  Genitourinary: denies dysuria, urgency, frequency, hematuria, flank pain and difficulty urinating.  Musculoskeletal: denies  myalgias, back pain, joint swelling, arthralgias and gait problem.   Skin: denies pallor, rash and wound.  Neurological: denies dizziness, seizures, syncope, weakness, light-headedness, numbness and headaches.   Hematological: denies adenopathy, easy bruising, personal or family bleeding history.  Psychiatric/ Behavioral: denies suicidal ideation, mood changes, confusion, nervousness, sleep disturbance and agitation.       All other systems are reviewed and negative.    PHYSICAL EXAM: VS:  BP 138/60 mmHg  Pulse 58  Ht _0  (1.575 m)  Wt 74.844 kg (165 lb)  BMI 30.17 kg/m2 , BMI Body mass index is 30.17 kg/(m^2). GEN: Well nourished, well developed, in no acute distress HEENT: normal Neck: no JVD, carotid bruits, or masses Cardiac: RRR; no murmurs, rubs, or gallops,no edema  Respiratory:  clear to auscultation bilaterally, normal work of breathing GI: soft, nontender, nondistended, + BS MS: no deformity or atrophy Skin: warm and dry, no rash Neuro:  Strength and sensation are intact Psych: normal   EKG:  EKG is not ordered today. The ekg ordered 03/15/15  demonstrates sinus bradycardia    Recent Labs: 03/05/2015: ALT 18; BUN 21.3; Creatinine 1.3*; HGB 10.6*; Platelets 223; Potassium 4.1; Sodium 135* 03/15/2015: TSH 2.89    Lipid Panel    Component Value Date/Time   CHOL 147 03/15/2015 1511   TRIG 87.0 03/15/2015 1511   HDL 48.50 03/15/2015 1511   CHOLHDL 3 03/15/2015 1511   VLDL 17.4 03/15/2015 1511   LDLCALC 81 03/15/2015 1511      Wt Readings from Last 3 Encounters:  05/02/15 74.844 kg (165 lb)  03/15/15 72.122 kg (159 lb)    03/05/15 73.301 kg (161 lb 9.6 oz)      Other studies Reviewed: Additional studies/ records that were reviewed today include: . Review of the above records demonstrates:    ASSESSMENT AND PLAN:  1. Sinus bradycardia -   Entirely benign and asymptomatic.   No indication for pacer at this time.  She should get an ECG yearly with Dr. Jenny Reichmann. We will be glad to see her if she has any symptoms of bradycardia or develops worrisome ECG findings.  Currently in sinus rhythm   2. Breast cancer  - takes tamoxifen  3. Essential Hypertension .BP is well controlled.  On  amlodipine.  Thinks her leg edema may be worse since she started.  If she has progressive leg edema, I would DC the amlodipine and try Valsartan with a higher dose of HCTZ instead of the Losartan.     Current medicines are reviewed at length with the patient today.  The patient does not have concerns regarding medicines.  The following changes have been made:  no change  Labs/ tests ordered today include:  No orders of the defined types were placed in this encounter.     Disposition:   FU with me as needed.   Will follow up with her medical doctors      Shaneque Merkle, Wonda Cheng, MD  05/02/2015 8:07 AM    [address] [place], [place], [place]  [address] Phone: [phone]; Fax: [phone]   [facility]  [address] [place], [place]  [address] [phone]    Fax [phone]

## 2015-05-28 ENCOUNTER — Encounter: Payer: Self-pay | Admitting: Internal Medicine

## 2015-05-28 ENCOUNTER — Ambulatory Visit (INDEPENDENT_AMBULATORY_CARE_PROVIDER_SITE_OTHER): Payer: Commercial Managed Care - HMO | Admitting: Internal Medicine

## 2015-05-28 VITALS — BP 134/76 | HR 60 | Temp 97.8°F | Ht 62.0 in | Wt 160.0 lb

## 2015-05-28 DIAGNOSIS — N183 Chronic kidney disease, stage 3 unspecified: Secondary | ICD-10-CM

## 2015-05-28 DIAGNOSIS — Z0000 Encounter for general adult medical examination without abnormal findings: Secondary | ICD-10-CM

## 2015-05-28 DIAGNOSIS — Z0189 Encounter for other specified special examinations: Secondary | ICD-10-CM

## 2015-05-28 DIAGNOSIS — R7302 Impaired glucose tolerance (oral): Secondary | ICD-10-CM

## 2015-05-28 DIAGNOSIS — I10 Essential (primary) hypertension: Secondary | ICD-10-CM

## 2015-05-28 MED ORDER — VALSARTAN-HYDROCHLOROTHIAZIDE 320-25 MG PO TABS: 1.0000 | ORAL_TABLET | Freq: Every day | ORAL | Status: DC

## 2015-05-28 NOTE — Patient Instructions (Signed)
Ok to stop the amlodipine 5 mg per day, and the hyzaar (losartan HCT)  Please take all new medication as prescribed - the diovan HCT (when arrives from [facility])  Please continue all other medications as before, and refills have been done if requested.  Please have the pharmacy call with any other refills you may need.  Please continue your efforts at being more active, low cholesterol diet, and weight control.  You are otherwise up to date with prevention measures today.  Please keep your appointments with your specialists as you may have planned  Please return in 6 months, or sooner if needed, with Lab testing done 3-5 days before

## 2015-05-28 NOTE — Assessment & Plan Note (Signed)
Asympt, stable overall by history and exam, recent data reviewed with pt, and pt to continue medical treatment as before,  to f/u any worsening symptoms or concerns Lab Results  Component Value Date   HGBA1C 5.3 03/15/2015

## 2015-05-28 NOTE — Assessment & Plan Note (Signed)
stable overall by history and exam, recent data reviewed with pt, and pt to continue medical treatment as before,  to f/u any worsening symptoms or concerns Lab Results  Component Value Date   CREATININE 1.3* 03/05/2015

## 2015-05-28 NOTE — Progress Notes (Signed)
Pre visit review using our clinic review tool, if applicable. No additional management support is needed unless otherwise documented below in the visit note. 

## 2015-05-28 NOTE — Progress Notes (Signed)
Subjective:    Patient ID: [name], female    DOB: 01/24/1936, 79 y.o.   MRN: [id number]  HPI  Here to f/u; overall doing ok,  Pt denies chest pain, increasing sob or doe, wheezing, orthopnea, PND, increased LE swelling, palpitations, dizziness or syncope., but does have some ongoing peripheral edema.  Saw card who rec;d consider change amlod/hyzaar to higher dose diovan hct.  .  Pt denies new neurological symptoms such as new headache, or facial or extremity weakness or numbness.  Pt denies polydipsia, polyuria, or low sugar episode.   Pt denies new neurological symptoms such as new headache, or facial or extremity weakness or numbness.   Pt states overall good compliance with meds, mostly trying to follow appropriate diet, with wt overall stable  Has some constipation off and on, wondering if related to meds, but Denies worsening reflux, abd pain, dysphagia, n/v, other bowel change or blood. Wt Readings from Last 3 Encounters:  05/28/15 160 lb (72.576 kg)  05/02/15 165 lb (74.844 kg)  03/15/15 159 lb (72.122 kg)   Past Medical History  Diagnosis Date  . Colon polyp   . Chronic rhinitis   . Morbid obesity   . DJD (degenerative joint disease)   . Hypertension   . Chronic renal insufficiency   . Memory loss   . Anemia   . GERD (gastroesophageal reflux disease)   . Wears glasses   . Full dentures   . Impaired glucose tolerance 07/18/2014  . Breast cancer of upper-outer quadrant of left female breast 11/08/2013    ER/PR+ Her2- Left IDC    Past Surgical History  Procedure Laterality Date  . Cataract extraction  2009    rt  . Vesicovaginal fistula closure w/ tah  1980  . Total knee arthroplasty  2002    rt  . Total knee arthroplasty  2003    left  . Abdominal hysterectomy    . Tonsillectomy    . Knee arthroscopy      both  . Colonoscopy    . Breast lumpectomy with needle localization and axillary sentinel lymph node bx Left 12/04/2013    Procedure: BREAST LUMPECTOMY  WITH NEEDLE LOCALIZATION AND AXILLARY SENTINEL LYMPH NODE BX;  Surgeon: Shann Medal, MD;  Location: [facility];  Service: General;  Laterality: Left;    reports that she has never smoked. She has never used smokeless tobacco. She reports that she does not drink alcohol or use illicit drugs. family history includes Colon cancer in her maternal aunt and mother; Heart disease in an other family member; Lung cancer in her brother. Allergies  Allergen Reactions  . Iron     REACTION: hives/whelps/bad constipation   Current Outpatient Prescriptions on File Prior to Visit  Medication Sig Dispense Refill  . Acetaminophen (TYLENOL ARTHRITIS PAIN PO) Take by mouth. Take as directed     . amLODipine (NORVASC) 5 MG tablet Take 1 tablet (5 mg total) by mouth daily. 90 tablet 3  . aspirin 81 MG tablet Take 81 mg by mouth daily.      [name] atropine 1 % ophthalmic solution     . Cholecalciferol (VITAMIN D3) 2000 UNITS TABS Take 1 tablet by mouth daily.     . famotidine (PEPCID) 20 MG tablet Take 20 mg by mouth at bedtime.    . fexofenadine (ALLEGRA) 180 MG tablet Take 180 mg by mouth daily as needed.      [name] glucosamine-chondroitin 500-400 MG tablet Take  1 tablet by mouth daily.     [name] losartan-hydrochlorothiazide (HYZAAR) 100-12.5 MG per tablet Take 1 tablet by mouth daily. 90 tablet 3  . Multiple Vitamins-Minerals (CENTRUM SILVER PO) Take 1 tablet by mouth daily.      [name] omeprazole (PRILOSEC) 20 MG capsule Take 20 mg by mouth daily.    . prednisoLONE acetate (PRED FORTE) 1 % ophthalmic suspension Place 1 drop into the left eye 4 (four) times daily.    . Simethicone (GAS-X PO) Take by mouth. Take as directed     . tamoxifen (NOLVADEX) 20 MG tablet Take 1 tablet (20 mg total) by mouth daily. 90 tablet 3  . traMADol (ULTRAM) 50 MG tablet TAKE 1 TABLET EVERY 6 HOURS AS NEEDED 120 tablet 2   No current facility-administered medications on file prior to visit.   Review of Systems  Constitutional:  Negative for unusual diaphoresis or night sweats HENT: Negative for ringing in ear or discharge Eyes: Negative for double vision or worsening visual disturbance.  Respiratory: Negative for choking and stridor.   Gastrointestinal: Negative for vomiting or other signifcant bowel change Genitourinary: Negative for hematuria or change in urine volume.  Musculoskeletal: Negative for other MSK pain or swelling Skin: Negative for color change and worsening wound.  Neurological: Negative for tremors and numbness other than noted  Psychiatric/Behavioral: Negative for decreased concentration or agitation other than above       Objective:   Physical Exam BP 134/76 mmHg  Pulse 60  Temp(Src) 97.8 F (36.6 C) (Oral)  Ht $R'5\' 2"'lg$  (1.575 m)  Wt 160 lb (72.576 kg)  BMI 29.26 kg/m2  SpO2 97% VS noted,  Constitutional: Pt appears in no significant distress HENT: Head: NCAT.  Right Ear: External ear normal.  Left Ear: External ear normal.  Eyes: . Pupils are equal, round, and reactive to light. Conjunctivae and EOM are normal Neck: Normal range of motion. Neck supple.  Cardiovascular: Normal rate and regular rhythm.   Pulmonary/Chest: Effort normal and breath sounds without rales or wheezing.  Abd:  Soft, NT, ND, + BS Neurological: Pt is alert. Not confused , motor grossly intact Skin: Skin is warm. No rash, trace bilat left > right LE edema Psychiatric: Pt behavior is normal. No agitation.        Assessment & Plan:

## 2015-05-28 NOTE — Assessment & Plan Note (Signed)
With trace edema today, ok to d/c the amlod/hyzaar, then start diovan HCT 320/25 - 1 qd,  to f/u any worsening symptoms or concerns, f/u BP at home and next visit BP Readings from Last 3 Encounters:  05/28/15 134/76  05/02/15 138/60  03/15/15 162/78

## 2015-07-15 ENCOUNTER — Telehealth: Payer: Self-pay | Admitting: *Deleted

## 2015-07-15 MED ORDER — VALSARTAN-HYDROCHLOROTHIAZIDE 320-25 MG PO TABS: 1.0000 | ORAL_TABLET | Freq: Every day | ORAL | Status: DC

## 2015-07-15 NOTE — Telephone Encounter (Signed)
Left msg on triage stating needing refills. Called pt back no answer LMOM pls give a call back with name of medications she is needing...[name]

## 2015-07-15 NOTE — Telephone Encounter (Signed)
Patient advised that medications needed are  losartan-hydrochlorothiazide (HYZAAR) 100-12.5 MG per tablet [id number] DISCONTINUED  traMADol (ULTRAM) 50 MG tablet [id number]   Sent to [facility] order

## 2015-07-15 NOTE — Telephone Encounter (Signed)
Per chart pt is taking Valsartan-HCTZ sent refills to [facility]. Will forward to md for approval on Tramadol....Johny Chess

## 2015-07-16 ENCOUNTER — Encounter: Payer: Self-pay | Admitting: Internal Medicine

## 2015-07-16 MED ORDER — TRAMADOL HCL 50 MG PO TABS: 50.0000 mg | ORAL_TABLET | Freq: Four times a day (QID) | ORAL | Status: DC | PRN

## 2015-07-16 NOTE — Telephone Encounter (Signed)
Done hardcopy to Dahlia  

## 2015-07-16 NOTE — Telephone Encounter (Signed)
Faxed Tramadol script to [facility]...[name]

## 2015-07-19 ENCOUNTER — Ambulatory Visit: Payer: Commercial Managed Care - HMO

## 2015-07-22 ENCOUNTER — Other Ambulatory Visit: Payer: Self-pay | Admitting: Internal Medicine

## 2015-07-26 ENCOUNTER — Ambulatory Visit (INDEPENDENT_AMBULATORY_CARE_PROVIDER_SITE_OTHER): Payer: Commercial Managed Care - HMO

## 2015-07-26 DIAGNOSIS — Z23 Encounter for immunization: Secondary | ICD-10-CM | POA: Diagnosis not present

## 2015-08-27 ENCOUNTER — Telehealth: Payer: Self-pay | Admitting: Oncology

## 2015-08-27 NOTE — Telephone Encounter (Signed)
Left a message returning her call. 

## 2015-08-28 ENCOUNTER — Telehealth: Payer: Self-pay | Admitting: Oncology

## 2015-08-28 NOTE — Telephone Encounter (Signed)
Patient called back and request to reschedule to next avail which is 10/2015,done

## 2015-09-05 ENCOUNTER — Ambulatory Visit: Payer: Commercial Managed Care - HMO | Admitting: Oncology

## 2015-09-05 ENCOUNTER — Ambulatory Visit: Payer: Commercial Managed Care - HMO | Admitting: Hematology and Oncology

## 2015-09-10 ENCOUNTER — Ambulatory Visit: Payer: Commercial Managed Care - HMO | Admitting: Internal Medicine

## 2015-10-31 ENCOUNTER — Telehealth: Payer: Self-pay | Admitting: Oncology

## 2015-10-31 ENCOUNTER — Ambulatory Visit (HOSPITAL_BASED_OUTPATIENT_CLINIC_OR_DEPARTMENT_OTHER): Payer: Commercial Managed Care - HMO | Admitting: Oncology

## 2015-10-31 VITALS — BP 158/69 | HR 71 | Temp 98.0°F | Resp 18 | Ht 62.0 in | Wt 158.9 lb

## 2015-10-31 DIAGNOSIS — C50412 Malignant neoplasm of upper-outer quadrant of left female breast: Secondary | ICD-10-CM

## 2015-10-31 DIAGNOSIS — Z170 Estrogen receptor positive status [ER+]: Secondary | ICD-10-CM

## 2015-10-31 NOTE — Progress Notes (Signed)
[place]  [phone] Fax:(336) [phone]     ID: [name] OB: 07-Jul-1936  MR#: [id number]  FGH#:[id number]  PCP: Cathlean Cower, MD GYN:  [name], MD SU: [name], MD OTHER MD: [name], MD; [name], MD; [name], MD  CHIEF COMPLAINT:  Hx of Left Breast Cancer CURRENT TREATMENT: tamoxifen  BREAST CANCER HISTORY: From the original intake note:  The patient underwent routine screening mammography at [facility] 10/09/2013 showing a 1 cm cluster of calcifications in the right breast at 5:00. There was also a 9 mm irregular density in the left breast laterally. Bilateral diagnostic mammography with tomography at [facility] 11/01/2013 confirmed a 1 cm cluster of vascular calcifications at the 5:00 position of the right breast, which were felt to be stable. However in the left breast there was a new 1.1 cm mass with spiculated margins. This was at 1:00, 8 cm from the nipple. Ultrasound of the left breast the same day showed the mass to measure 6 mm and to be hypoechoic. The left axilla was unremarkable.  Biopsy of the left breast mass in question 11/06/2013 showed ([id number]) and invasive ductal carcinoma, grade 2, estrogen receptor 100% positive, progesterone receptor 95% positive, both with strong staining intensity, with an MIB-1 of 19% and no HER-2 amplification, the signals ratio being 0.95 and the number per cell 2.00. E-cadherin was positive.  The patient's subsequent history is as detailed below  INTERVAL HISTORY: Ermina returns today for follow up of her Estrogen receptor positive breast cancer.she continues on tamoxifen, with excellent tolerance. Hot flashes and vaginal wetness are not concerns. She had significant hair thinning and arthralgias/myalgias from the aromatase inhibitors and these have not recurred or worsened. She does of course have significant arthritis at baseline. She obtains a tamoxifen at.   REVIEW OF  SYSTEMS: Daielle Has had a little bit of low back pain because she overdid it over the holidays she says. Otherwise she is planning to get back to the wife where she used to go regularly. She did complete the [employer] program there. A detailed review of systems today was otherwise entirely stable.  PAST MEDICAL HISTORY: Past Medical History  Diagnosis Date  . Colon polyp   . Chronic rhinitis   . Morbid obesity   . DJD (degenerative joint disease)   . Hypertension   . Chronic renal insufficiency   . Memory loss   . Anemia   . GERD (gastroesophageal reflux disease)   . Wears glasses   . Full dentures   . Impaired glucose tolerance 07/18/2014  . Breast cancer of upper-outer quadrant of left female breast 11/08/2013    ER/PR+ Her2- Left IDC     PAST SURGICAL HISTORY: Past Surgical History  Procedure Laterality Date  . Cataract extraction  2009    rt  . Vesicovaginal fistula closure w/ tah  1980  . Total knee arthroplasty  2002    rt  . Total knee arthroplasty  2003    left  . Abdominal hysterectomy    . Tonsillectomy    . Knee arthroscopy      both  . Colonoscopy    . Breast lumpectomy with needle localization and axillary sentinel lymph node bx Left 12/04/2013    Procedure: BREAST LUMPECTOMY WITH NEEDLE LOCALIZATION AND AXILLARY SENTINEL LYMPH NODE BX;  Surgeon: Shann Medal, MD;  Location: [facility];  Service: General;  Laterality: Left;   status post hysterectomy with bilateral sloping oophorectomy  FAMILY HISTORY Family History  Problem Relation Age of Onset  . Heart disease      remote family  . Lung cancer Brother     was a smoker  . Colon cancer Mother   . Colon cancer Maternal Aunt    the patient's mother died at the age of 50, with a history of colon cancer. The patient's father died at the age of 69 with "stomach cancer". The patient had 5 brothers, 3 sisters. One brother had lung cancer diagnosed at age 34. One maternal aunt had colon  cancer at age 24. There is no history of breast or ovarian cancer in the family to her knowledge.  GYNECOLOGIC HISTORY:   (Reviewed 03/28/2014) Menarche age 55, first live birth age 36, the patient is Bowen P2. She underwent hysterectomy with bilateral salpingo-oophorectomy approximately 34 years ago. She did not use hormone replacement. She did use birth control remotely, for about 15 years, with no complications.  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 She is widowed, lives alone with no pets. Son [name] died from a myocardial infarction at the age of 53. Daughter Telisa Ohlsen works in [place] as an Microbiologist (at [employer]). The patient has 2 grandchildren and one great-grandchild. She is a Psychologist, forensic.    ADVANCED DIRECTIVES: In place; the patient's daughter [name] is her healthcare power of attorney. [name] can be reached at [phone] (cell), and [phone] (work).   HEALTH MAINTENANCE:  (Updated 03/28/2014) Social History  Substance Use Topics  . Smoking status: Never Smoker   . Smokeless tobacco: Never Used  . Alcohol Use: No    Colonoscopy:  Oct 2011/DrHenrene Pastor  PAP: s/p hysterectomy   Bone density: 03/15/2013 at [facility] [place], shows normal bone density  Lipid panel:  April 2015/Dr. John   Allergies  Allergen Reactions  . Iron     REACTION: hives/whelps/bad constipation    Current Outpatient Prescriptions  Medication Sig Dispense Refill  . Acetaminophen (TYLENOL ARTHRITIS PAIN PO) Take by mouth. Take as directed     . aspirin 81 MG tablet Take 81 mg by mouth daily.      [name] atropine 1 % ophthalmic solution     . Cholecalciferol (VITAMIN D3) 2000 UNITS TABS Take 1 tablet by mouth daily.     . famotidine (PEPCID) 20 MG tablet Take 20 mg by mouth at bedtime.    . fexofenadine (ALLEGRA) 180 MG tablet Take 180 mg by mouth daily as needed.      [name]  glucosamine-chondroitin 500-400 MG tablet Take 1 tablet by mouth daily.     . Multiple Vitamins-Minerals (CENTRUM SILVER PO) Take 1 tablet by mouth daily.      [name] omeprazole (PRILOSEC) 20 MG capsule Take 20 mg by mouth daily.    . prednisoLONE acetate (PRED FORTE) 1 % ophthalmic suspension Place 1 drop into the left eye 4 (four) times daily.    . Simethicone (GAS-X PO) Take by mouth. Take as directed     . tamoxifen (NOLVADEX) 20 MG tablet Take 1 tablet (20 mg total) by mouth daily. 90 tablet 3  . traMADol (ULTRAM) 50 MG tablet Take 1 tablet (50 mg total) by mouth every 6 (six) hours as needed. 120 tablet 2  . valsartan-hydrochlorothiazide (DIOVAN HCT) 320-25 MG per tablet Take 1 tablet by mouth daily. 90 tablet 3   No current facility-administered medications for  this visit.    OBJECTIVE: Older African American woman who appears stated age 80 Vitals:   10/31/15 1031  BP: 158/69  Pulse: 71  Temp: 98 F (36.7 C)  Resp: 18     Body mass index is 29.06 kg/(m^2).    ECOG FS: 0 Filed Weights   10/31/15 1031  Weight: 158 lb 14.4 oz (72.077 kg)   Sclerae unicteric, EOMs intact Oropharynx clear, no thrush or other lesions No cervical or supraclavicular adenopathy Lungs no rales or rhonchi Heart regular rate and rhythm Abd soft, nontender, positive bowel sounds MSK no focal spinal tenderness, no upper extremity lymphedema Neuro: nonfocal, well oriented, appropriate affect Breasts: the right breast is status post lumpectomy. There is no evidence of local recurrence. The right axilla is benign per the left breast is unremarkable.  LAB RESULTS:   Lab Results  Component Value Date   WBC 5.5 03/05/2015   NEUTROABS 3.2 03/05/2015   HGB 10.6* 03/05/2015   HCT 31.9* 03/05/2015   MCV 94.9 03/05/2015   PLT 223 03/05/2015      Chemistry      Component Value Date/Time   NA 135* 03/05/2015 1007   NA 131* 11/27/2014 1207   K 4.1 03/05/2015 1007   K 4.4 11/27/2014 1207   CL 97  11/27/2014 1207   CO2 24 03/05/2015 1007   CO2 30 11/27/2014 1207   BUN 21.3 03/05/2015 1007   BUN 24* 11/27/2014 1207   CREATININE 1.3* 03/05/2015 1007   CREATININE 1.19 11/27/2014 1207      Component Value Date/Time   CALCIUM 8.9 03/05/2015 1007   CALCIUM 9.9 11/27/2014 1207   ALKPHOS 57 03/05/2015 1007   ALKPHOS 56 11/27/2014 1207   AST 23 03/05/2015 1007   AST 22 11/27/2014 1207   ALT 18 03/05/2015 1007   ALT 18 11/27/2014 1207   BILITOT 0.42 03/05/2015 1007   BILITOT 0.4 11/27/2014 1207       STUDIES:  Mammography at [facility] 09/30/2015 showed a group of calcifications in the left breast upper outer quadrant which are felt to be stable and related to prior surgery. Nevertheless a six-month follow-up has been arranged for..  ASSESSMENT: 80 y.o. [place] woman status post left breast biopsy 11/06/2013 for a clinical T1n N0, stage IA invasive ductal carcinoma, grade 2, estrogen receptor 100% positive, progesterone receptor 95% positive, with an MIB-1 of 19% and no HER-2 amplification  (1) status post left lumpectomy and sentinel lymph node sampling 12/04/2013 for a pT1c pN0, stage IA invasive ductal carcinoma, grade 1, repeat HER-2 again negative, with ample margins  (2) anastrozole Mar 2015-Dec 2015, letrozole Jan 2016-Feb 2016. Started tamoxifen March 2016  (3) f opted to forgo radiation therapy   PLAN: Alannie continues on tamoxifen, with excellent tolerance. The plan is to continue that drug through March 2020.   She will see [place] again in October, after her 6 month follow-up mammography. She will then see me April 2018 and from that point we will start following her on a once a year basis.  I affirmed and encouraged her plan to participate in the [employer] [facility]. I also gave her a pamphlet on how to volunteer here.  She knows to call for any problem that may develop before the next visit.     Chauncey Cruel, MD   10/31/2015 10:37 AM

## 2015-10-31 NOTE — Telephone Encounter (Signed)
Appointments made and avs printed   ane

## 2015-11-13 ENCOUNTER — Encounter: Payer: Self-pay | Admitting: Internal Medicine

## 2015-11-13 ENCOUNTER — Ambulatory Visit (INDEPENDENT_AMBULATORY_CARE_PROVIDER_SITE_OTHER): Payer: Commercial Managed Care - HMO | Admitting: Internal Medicine

## 2015-11-13 VITALS — BP 136/78 | HR 67 | Temp 98.7°F | Resp 20 | Ht 62.0 in | Wt 156.8 lb

## 2015-11-13 DIAGNOSIS — R7302 Impaired glucose tolerance (oral): Secondary | ICD-10-CM | POA: Diagnosis not present

## 2015-11-13 DIAGNOSIS — I10 Essential (primary) hypertension: Secondary | ICD-10-CM

## 2015-11-13 DIAGNOSIS — M5412 Radiculopathy, cervical region: Secondary | ICD-10-CM | POA: Insufficient documentation

## 2015-11-13 MED ORDER — TRAMADOL HCL 50 MG PO TABS: 50.0000 mg | ORAL_TABLET | Freq: Four times a day (QID) | ORAL | Status: DC | PRN

## 2015-11-13 MED ORDER — PREDNISONE 10 MG PO TABS: ORAL_TABLET | ORAL | Status: DC

## 2015-11-13 MED ORDER — TIZANIDINE HCL 4 MG PO TABS: 4.0000 mg | ORAL_TABLET | Freq: Four times a day (QID) | ORAL | Status: DC | PRN

## 2015-11-13 NOTE — Patient Instructions (Signed)
Please take all new medication as prescribed - the pain medication, muscle relaxer, and prednisone   Please call if not improved in 5 days to consider having MRI of the neck spine  Please continue all other medications as before, and refills have been done if requested.  Please have the pharmacy call with any other refills you may need.  Please keep your appointments with your specialists as you may have planned

## 2015-11-13 NOTE — Progress Notes (Signed)
Pre visit review using our clinic review tool, if applicable. No additional management support is needed unless otherwise documented below in the visit note. 

## 2015-11-13 NOTE — Progress Notes (Signed)
Subjective:    Patient ID: [name], female    DOB: Mar 03, 1936, 80 y.o.   MRN: [id number]  HPI  Here with acute onset mod to severe neck pain x 5 days, constant, iseemed to start at the left, then went to the right with some radiation to the right shoudler, seemed to have stiffness of the neck and then headache with that yesteray as well. Seems somewhat less stiff today.   No prior hx. Pt without LBP, bowel or bladder change, fever, wt loss,  worsening LE pain/numbness/weakness, gait change or falls.  No fever, cough, but had some mild transient ST, better with gargle with salt water. Pt denies chest pain, increased sob or doe, wheezing, orthopnea, PND, increased LE swelling, palpitations, dizziness or syncope.  Pt denies new neurological symptoms such as new headache, or facial or extremity weakness or numbness   Pt denies polydipsia, polyuria. Past Medical History  Diagnosis Date  . Colon polyp   . Chronic rhinitis   . Morbid obesity ([place])   . DJD (degenerative joint disease)   . Hypertension   . Chronic renal insufficiency   . Memory loss   . Anemia   . GERD (gastroesophageal reflux disease)   . Wears glasses   . Full dentures   . Impaired glucose tolerance 07/18/2014  . Breast cancer of upper-outer quadrant of left female breast ([place]) 11/08/2013    ER/PR+ Her2- Left IDC    Past Surgical History  Procedure Laterality Date  . Cataract extraction  2009    rt  . Vesicovaginal fistula closure w/ tah  1980  . Total knee arthroplasty  2002    rt  . Total knee arthroplasty  2003    left  . Abdominal hysterectomy    . Tonsillectomy    . Knee arthroscopy      both  . Colonoscopy    . Breast lumpectomy with needle localization and axillary sentinel lymph node bx Left 12/04/2013    Procedure: BREAST LUMPECTOMY WITH NEEDLE LOCALIZATION AND AXILLARY SENTINEL LYMPH NODE BX;  Surgeon: Shann Medal, MD;  Location: [facility];  Service: General;  Laterality: Left;     reports that she has never smoked. She has never used smokeless tobacco. She reports that she does not drink alcohol or use illicit drugs. family history includes Colon cancer in her maternal aunt and mother; Lung cancer in her brother. Allergies  Allergen Reactions  . Iron     REACTION: hives/whelps/bad constipation   Current Outpatient Prescriptions on File Prior to Visit  Medication Sig Dispense Refill  . aspirin 81 MG tablet Take 81 mg by mouth daily.      [name] atropine 1 % ophthalmic solution     . Cholecalciferol (VITAMIN D3) 2000 UNITS TABS Take 1 tablet by mouth daily.     . famotidine (PEPCID) 20 MG tablet Take 20 mg by mouth at bedtime.    . fexofenadine (ALLEGRA) 180 MG tablet Take 180 mg by mouth daily as needed.      [name] glucosamine-chondroitin 500-400 MG tablet Take 1 tablet by mouth daily.     . Multiple Vitamins-Minerals (CENTRUM SILVER PO) Take 1 tablet by mouth daily.      [name] omeprazole (PRILOSEC) 20 MG capsule Take 20 mg by mouth daily.    . Potassium Gluconate 550 (90 K) MG TABS Take 1 tablet by mouth daily.    . Simethicone (GAS-X PO) Take by mouth. Take as directed     .  tamoxifen (NOLVADEX) 20 MG tablet Take 1 tablet (20 mg total) by mouth daily. 90 tablet 3  . valsartan-hydrochlorothiazide (DIOVAN HCT) 320-25 MG per tablet Take 1 tablet by mouth daily. 90 tablet 3   No current facility-administered medications on file prior to visit.   Review of Systems  Constitutional: Negative for unusual diaphoresis or night sweats HENT: Negative for ringing in ear or discharge Eyes: Negative for double vision or worsening visual disturbance.  Respiratory: Negative for choking and stridor.   Gastrointestinal: Negative for vomiting or other signifcant bowel change Genitourinary: Negative for hematuria or change in urine volume.  Musculoskeletal: Negative for other MSK pain or swelling Skin: Negative for color change and worsening wound.  Neurological: Negative for tremors and  numbness other than noted  Psychiatric/Behavioral: Negative for decreased concentration or agitation other than above       Objective:   Physical Exam BP 136/78 mmHg  Pulse 67  Temp(Src) 98.7 F (37.1 C) (Oral)  Resp 20  Ht '5\' 2"'$  (1.575 m)  Wt 156 lb 12 oz (71.101 kg)  BMI 28.66 kg/m2  SpO2 97% VS noted, not ill appaering Constitutional: Pt appears in no significant distress HENT: Head: NCAT.  Right Ear: External ear normal.  Left Ear: External ear normal.  Eyes: . Pupils are equal, round, and reactive to light. Conjunctivae and EOM are normal Neck: Normal range of motion. Neck supple.  Cardiovascular: Normal rate and regular rhythm.   Pulmonary/Chest: Effort normal and breath sounds without rales or wheezing.  Abd:  Soft, NT, ND, + BS Neurological: Pt is alert. Not confused , motor 5/5 intact except 4+ 5 diffuse RUE with reduced sensation to LT, dtr intact Skin: Skin is warm. No rash, no LE edema Psychiatric: Pt behavior is normal. No agitation.     Assessment & Plan:

## 2015-11-18 ENCOUNTER — Telehealth: Payer: Self-pay | Admitting: Internal Medicine

## 2015-11-18 NOTE — Telephone Encounter (Signed)
Patient is requesting call back to let her know if she needs to go have lab work done before her next appt.

## 2015-11-18 NOTE — Telephone Encounter (Signed)
Contacted pt and informed that labs are ready for her at her convenience.

## 2015-11-21 NOTE — Assessment & Plan Note (Signed)
stable overall by history and exam, recent data reviewed with pt, and pt to continue medical treatment as before,  to f/u any worsening symptoms or concerns BP Readings from Last 3 Encounters:  11/13/15 136/78  10/31/15 158/69  05/28/15 134/76

## 2015-11-21 NOTE — Assessment & Plan Note (Signed)
stable overall by history and exam, recent data reviewed with pt, and pt to continue medical treatment as before,  to f/u any worsening symptoms or concerns Lab Results  Component Value Date   HGBA1C 5.3 03/15/2015   Pt to call for onset polys or cbg > 200 with steroid tx

## 2015-11-21 NOTE — Assessment & Plan Note (Signed)
Mild to mod, for pain control, muscle relaxer prn, predpac trial, and MRI c-spine,  to f/u any worsening symptoms or concerns

## 2015-11-25 ENCOUNTER — Other Ambulatory Visit: Payer: Self-pay | Admitting: Oncology

## 2015-11-25 ENCOUNTER — Other Ambulatory Visit (INDEPENDENT_AMBULATORY_CARE_PROVIDER_SITE_OTHER): Payer: Commercial Managed Care - HMO

## 2015-11-25 DIAGNOSIS — R7302 Impaired glucose tolerance (oral): Secondary | ICD-10-CM

## 2015-11-25 DIAGNOSIS — Z0000 Encounter for general adult medical examination without abnormal findings: Secondary | ICD-10-CM

## 2015-11-25 DIAGNOSIS — Z0189 Encounter for other specified special examinations: Secondary | ICD-10-CM | POA: Diagnosis not present

## 2015-11-25 LAB — BASIC METABOLIC PANEL
BUN: 24 mg/dL — ABNORMAL HIGH (ref 6–23)
CO2: 30 mEq/L (ref 19–32)
Calcium: 8.8 mg/dL (ref 8.4–10.5)
Chloride: 100 mEq/L (ref 96–112)
Creatinine, Ser: 1.3 mg/dL — ABNORMAL HIGH (ref 0.40–1.20)
GFR: 50.94 mL/min — ABNORMAL LOW (ref >60.00)
Glucose, Bld: 103 mg/dL — ABNORMAL HIGH (ref 70–99)
Potassium: 4.1 mEq/L (ref 3.5–5.1)
Sodium: 136 mEq/L (ref 135–145)

## 2015-11-25 LAB — HEPATIC FUNCTION PANEL
ALT: 17 U/L (ref 0–35)
AST: 19 U/L (ref 0–37)
Albumin: 3.7 g/dL (ref 3.5–5.2)
Alkaline Phosphatase: 49 U/L (ref 39–117)
Bilirubin, Direct: 0.1 mg/dL (ref 0.0–0.3)
Total Bilirubin: 0.2 mg/dL (ref 0.2–1.2)
Total Protein: 7 g/dL (ref 6.0–8.3)

## 2015-11-25 LAB — CBC WITH DIFFERENTIAL/PLATELET
Basophils Absolute: 0.1 10*3/uL (ref 0.0–0.1)
Basophils Relative: 0.6 % (ref 0.0–3.0)
Eosinophils Absolute: 0.2 10*3/uL (ref 0.0–0.7)
Eosinophils Relative: 2.8 % (ref 0.0–5.0)
HCT: 32.3 % — ABNORMAL LOW (ref 36.0–46.0)
Hemoglobin: 10.5 g/dL — ABNORMAL LOW (ref 12.0–15.0)
Lymphocytes Relative: 36.3 % (ref 12.0–46.0)
Lymphs Abs: 3.1 10*3/uL (ref 0.7–4.0)
MCHC: 32.3 g/dL (ref 30.0–36.0)
MCV: 97.3 fl (ref 78.0–100.0)
Monocytes Absolute: 0.7 10*3/uL (ref 0.1–1.0)
Monocytes Relative: 8.5 % (ref 3.0–12.0)
Neutro Abs: 4.4 10*3/uL (ref 1.4–7.7)
Neutrophils Relative %: 51.8 % (ref 43.0–77.0)
Platelets: 323 10*3/uL (ref 150.0–400.0)
RBC: 3.32 Mil/uL — ABNORMAL LOW (ref 3.87–5.11)
RDW: 13.8 % (ref 11.5–15.5)
WBC: 8.5 10*3/uL (ref 4.0–10.5)

## 2015-11-25 LAB — URINALYSIS, ROUTINE W REFLEX MICROSCOPIC
Bilirubin Urine: NEGATIVE
Ketones, ur: NEGATIVE
Nitrite: NEGATIVE
Specific Gravity, Urine: <=1.005 — AB (ref 1.000–1.030)
Total Protein, Urine: NEGATIVE
Urine Glucose: NEGATIVE
Urobilinogen, UA: 0.2 (ref 0.0–1.0)
pH: 6.5 (ref 5.0–8.0)

## 2015-11-25 LAB — LIPID PANEL
Cholesterol: 134 mg/dL (ref 0–200)
HDL: 41.4 mg/dL (ref >39.00)
LDL Cholesterol: 62 mg/dL (ref 0–99)
NonHDL: 93.05
Total CHOL/HDL Ratio: 3
Triglycerides: 155 mg/dL — ABNORMAL HIGH (ref 0.0–149.0)
VLDL: 31 mg/dL (ref 0.0–40.0)

## 2015-11-25 LAB — HEMOGLOBIN A1C: Hgb A1c MFr Bld: 5.6 % (ref 4.6–6.5)

## 2015-11-25 LAB — TSH: TSH: 1.15 u[IU]/mL (ref 0.35–4.50)

## 2015-11-26 ENCOUNTER — Other Ambulatory Visit (INDEPENDENT_AMBULATORY_CARE_PROVIDER_SITE_OTHER): Payer: Commercial Managed Care - HMO

## 2015-11-26 DIAGNOSIS — D509 Iron deficiency anemia, unspecified: Secondary | ICD-10-CM | POA: Diagnosis not present

## 2015-11-26 LAB — IRON: Iron: 51 ug/dL (ref 42–145)

## 2015-11-26 LAB — VITAMIN B12: Vitamin B-12: 1313 pg/mL — ABNORMAL HIGH (ref 211–911)

## 2015-11-26 NOTE — Telephone Encounter (Signed)
Chart reviewed.

## 2015-11-28 ENCOUNTER — Encounter: Payer: Self-pay | Admitting: Internal Medicine

## 2015-11-28 ENCOUNTER — Ambulatory Visit (INDEPENDENT_AMBULATORY_CARE_PROVIDER_SITE_OTHER): Payer: Commercial Managed Care - HMO | Admitting: Internal Medicine

## 2015-11-28 VITALS — BP 136/80 | HR 64 | Temp 97.8°F | Resp 20 | Ht 62.0 in | Wt 158.0 lb

## 2015-11-28 DIAGNOSIS — Z0000 Encounter for general adult medical examination without abnormal findings: Secondary | ICD-10-CM | POA: Diagnosis not present

## 2015-11-28 NOTE — Patient Instructions (Signed)
Please continue all other medications as before, and refills have been done if requested.  Please have the pharmacy call with any other refills you may need.  Please continue your efforts at being more active, low cholesterol diet, and weight control.  You are otherwise up to date with prevention measures today.  Please keep your appointments with your specialists as you may have planned  Please return in 6 months, or sooner if needed 

## 2015-11-28 NOTE — Progress Notes (Signed)
 Subjective:    Patient ID: [name], female    DOB: 12/20/1935, 80 y.o.   MRN: [id number]  HPI  Here for wellness and f/u;  Overall doing ok;  Pt denies Chest pain, worsening SOB, DOE, wheezing, orthopnea, PND, worsening LE edema, palpitations, dizziness or syncope.  Pt denies neurological change such as new headache, facial or extremity weakness.  Pt denies polydipsia, polyuria, or low sugar symptoms. Pt states overall good compliance with treatment and medications, good tolerability, and has been trying to follow appropriate diet.  Pt denies worsening depressive symptoms, suicidal ideation or panic. No fever, night sweats, wt loss, loss of appetite, or other constitutional symptoms.  Pt states good ability with ADL's, has low fall risk, home safety reviewed and adequate, no other significant changes in hearing or vision, and only occasionally active with exercise. No specific complaints Past Medical History  Diagnosis Date  . Colon polyp   . Chronic rhinitis   . Morbid obesity ([facility])   . DJD (degenerative joint disease)   . Hypertension   . Chronic renal insufficiency   . Memory loss   . Anemia   . GERD (gastroesophageal reflux disease)   . Wears glasses   . Full dentures   . Impaired glucose tolerance 07/18/2014  . Breast cancer of upper-outer quadrant of left female breast ([facility]) 11/08/2013    ER/PR+ Her2- Left IDC    Past Surgical History  Procedure Laterality Date  . Cataract extraction  2009    rt  . Vesicovaginal fistula closure w/ tah  1980  . Total knee arthroplasty  2002    rt  . Total knee arthroplasty  2003    left  . Abdominal hysterectomy    . Tonsillectomy    . Knee arthroscopy      both  . Colonoscopy    . Breast lumpectomy with needle localization and axillary sentinel lymph node bx Left 12/04/2013    Procedure: BREAST LUMPECTOMY WITH NEEDLE LOCALIZATION AND AXILLARY SENTINEL LYMPH NODE BX;  Surgeon: David H Newman, MD;  Location: [facility];  Service: General;  Laterality: Left;    reports that she has never smoked. She has never used smokeless tobacco. She reports that she does not drink alcohol or use illicit drugs. family history includes Colon cancer in her maternal aunt and mother; Lung cancer in her brother. Allergies  Allergen Reactions  . Iron     REACTION: hives/whelps/bad constipation   Current Outpatient Prescriptions on File Prior to Visit  Medication Sig Dispense Refill  . aspirin 81 MG tablet Take 81 mg by mouth daily.      . atropine 1 % ophthalmic solution     . Cholecalciferol (VITAMIN D3) 2000 UNITS TABS Take 1 tablet by mouth daily.     . famotidine (PEPCID) 20 MG tablet Take 20 mg by mouth at bedtime.    . fexofenadine (ALLEGRA) 180 MG tablet Take 180 mg by mouth daily as needed.      . glucosamine-chondroitin 500-400 MG tablet Take 1 tablet by mouth daily.     . Multiple Vitamins-Minerals (CENTRUM SILVER PO) Take 1 tablet by mouth daily.      . omeprazole (PRILOSEC) 20 MG capsule Take 20 mg by mouth daily.    . Potassium Gluconate 550 (90 K) MG TABS Take 1 tablet by mouth daily.    . predniSONE (DELTASONE) 10 MG tablet 3 tabs by mouth per day for 3 days,2tabs per day for 3   days,1tab per day for 3 days 18 tablet 0  . Simethicone (GAS-X PO) Take by mouth. Take as directed     . tamoxifen (NOLVADEX) 20 MG tablet TAKE 1 TABLET DAILY. 90 tablet 3  . tiZANidine (ZANAFLEX) 4 MG tablet Take 1 tablet (4 mg total) by mouth every 6 (six) hours as needed for muscle spasms. 40 tablet 0  . traMADol (ULTRAM) 50 MG tablet Take 1 tablet (50 mg total) by mouth every 6 (six) hours as needed. 120 tablet 2  . valsartan-hydrochlorothiazide (DIOVAN HCT) 320-25 MG per tablet Take 1 tablet by mouth daily. 90 tablet 3   No current facility-administered medications on file prior to visit.   Review of Systems Constitutional: Negative for increased diaphoresis, other activity, appetite or siginficant weight change other  than noted HENT: Negative for worsening hearing loss, ear pain, facial swelling, mouth sores and neck stiffness.   Eyes: Negative for other worsening pain, redness or visual disturbance.  Respiratory: Negative for shortness of breath and wheezing  Cardiovascular: Negative for chest pain and palpitations.  Gastrointestinal: Negative for diarrhea, blood in stool, abdominal distention or other pain Genitourinary: Negative for hematuria, flank pain or change in urine volume.  Musculoskeletal: Negative for myalgias or other joint complaints.  Skin: Negative for color change and wound or drainage.  Neurological: Negative for syncope and numbness. other than noted Hematological: Negative for adenopathy. or other swelling Psychiatric/Behavioral: Negative for hallucinations, SI, self-injury, decreased concentration or other worsening agitation.      Objective:   Physical Exam BP 136/80 mmHg  Pulse 64  Temp(Src) 97.8 F (36.6 C) (Oral)  Resp 20  Ht 5' 2" (1.575 m)  Wt 158 lb (71.668 kg)  BMI 28.89 kg/m2  SpO2 97% VS noted,  Constitutional: Pt is oriented to person, place, and time. Appears well-developed and well-nourished, in no significant distress Head: Normocephalic and atraumatic.  Right Ear: External ear normal.  Left Ear: External ear normal.  Nose: Nose normal.  Mouth/Throat: Oropharynx is clear and moist.  Eyes: Conjunctivae and EOM are normal. Pupils are equal, round, and reactive to light.  Neck: Normal range of motion. Neck supple. No JVD present. No tracheal deviation present or significant neck LA or mass Cardiovascular: Normal rate, regular rhythm, normal heart sounds and intact distal pulses.   Pulmonary/Chest: Effort normal and breath sounds without rales or wheezing  Abdominal: Soft. Bowel sounds are normal. NT. No HSM  Musculoskeletal: Normal range of motion. Exhibits no edema.  Lymphadenopathy:  Has no cervical adenopathy.  Neurological: Pt is alert and oriented to  person, place, and time. Pt has normal reflexes. No cranial nerve deficit. Motor grossly intact Skin: Skin is warm and dry. No rash noted.  Psychiatric:  Has normal mood and affect. Behavior is normal.     Assessment & Plan:   

## 2015-11-28 NOTE — Progress Notes (Signed)
Pre visit review using our clinic review tool, if applicable. No additional management support is needed unless otherwise documented below in the visit note. 

## 2015-12-01 NOTE — Assessment & Plan Note (Signed)

## 2015-12-15 ENCOUNTER — Telehealth: Payer: Self-pay

## 2015-12-15 NOTE — Telephone Encounter (Signed)
Call to [name] to cancel 8am AWV tomorrow. She will call and reschedule

## 2015-12-16 ENCOUNTER — Ambulatory Visit: Payer: Commercial Managed Care - HMO

## 2016-01-12 ENCOUNTER — Other Ambulatory Visit: Payer: Self-pay | Admitting: Internal Medicine

## 2016-01-14 ENCOUNTER — Ambulatory Visit: Payer: Commercial Managed Care - HMO

## 2016-01-14 NOTE — Telephone Encounter (Signed)
Medication sent to pharmacy  

## 2016-01-14 NOTE — Telephone Encounter (Signed)
Done hardcopy to Corinne  

## 2016-01-21 ENCOUNTER — Ambulatory Visit (INDEPENDENT_AMBULATORY_CARE_PROVIDER_SITE_OTHER): Payer: Commercial Managed Care - HMO

## 2016-01-21 VITALS — BP 134/64 | Ht 64.5 in | Wt 159.2 lb

## 2016-01-21 DIAGNOSIS — Z0000 Encounter for general adult medical examination without abnormal findings: Secondary | ICD-10-CM

## 2016-01-21 NOTE — Patient Instructions (Addendum)
[name] , Thank you for taking time to come for your Medicare Wellness Visit. I appreciate your ongoing commitment to your health goals. Please review the following plan we discussed and let me know if I can assist you in the future.   Will have hearing checked;  De[facility] of Hearing [facility] Services / will pay for one hearing aid No reviews  [employer] Office  [facility] [phone]  [phone]  Will fup with GYN in June  Educated to check with insurance regarding coverage of Shingles vaccination on Part D or Part B and may have lower co-pay if provided on the Part D side  Needs Tetanus   Shingles needed Important Safety Information ZOSTAVAX does not protect everyone, so some people who get the vaccine may still get Shingles.  You should not get ZOSTAVAX if you are allergic to any of its ingredients, including gelatin or neomycin, have a weakened immune system, take high doses of steroids, or are pregnant or plan to become pregnant. You should not get ZOSTAVAX to prevent chickenpox.  Talk to your health care professional if you plan to get ZOSTAVAX at the same time as [id number] (Pneumococcal Vaccine Polyvalent) because it may be better to get these vaccines at least 4 weeks apart.  Possible side effects include redness, pain, itching, swelling, hard lump, warmth, or bruising at the injection site, as well as headache.  ZOSTAVAX contains a weakened chickenpox virus. Tell your health care professional if you will be in close contact with newborn infants, someone who may be pregnant and has not had chickenpox or been vaccinated against chickenpox, or someone who has problems with their immune system. Your health care professional can tell you what situations you may need to avoid. You are encouraged to report negative side effects of prescription drugs to the FDA. Visit [employer], or call 1-800-FDA-1088.   These are the goals we discussed: Goals    . patient      Discussed Water aerobics in June;  Continue to learn and watch sodium intake       This is a list of the screening recommended for you and due dates:  Health Maintenance  Topic Date Due  . Tetanus Vaccine  10/30/1954  . Shingles Vaccine  10/30/1997  . Flu Shot  05/26/2016  . DEXA scan (bone density measurement)  Completed  . Pneumonia vaccines  Completed   Urinary Frequency The number of times a normal person urinates depends upon how much liquid they take in and how much liquid they are losing. If the temperature is hot and there is high humidity, then the person will sweat more and usually breathe a little more frequently. These factors decrease the amount of frequency of urination that would be considered normal. The amount you drink is easily determined, but the amount of fluid lost is sometimes more difficult to calculate.  Fluid is lost in two ways:  Sensible fluid loss is usually measured by the amount of urine that you get rid of. Losses of fluid can also occur with diarrhea.  Insensible fluid loss is more difficult to measure. It is caused by evaporation. Insensible loss of fluid occurs through breathing and sweating. It usually ranges from a little less than a quart to a little more than a quart of fluid a day. In normal temperatures and activity levels, the average person may urinate 4 to 7 times in a 24-hour period. Needing to urinate more often than that could  indicate a problem. If one urinates 4 to 7 times in 24 hours and has large volumes each time, that could indicate a different problem from one who urinates 4 to 7 times a day and has small volumes. The time of urinating is also important. Most urinating should be done during the waking hours. Getting up at night to urinate frequently can indicate some problems. CAUSES  The bladder is the organ in your lower abdomen that holds urine. Like a balloon, it swells some as it fills up. Your nerves sense this and tell you it is  time to head for the bathroom. There are a number of reasons that you might feel the need to urinate more often than usual. They include:  Urinary tract infection. This is usually associated with other signs such as burning when you urinate.  In men, problems with the prostate (a walnut-size gland that is located near the tube that carries urine out of your body). There are two reasons why the prostate can cause an increased frequency of urination:  An enlarged prostate that does not let the bladder empty well. If the bladder only half empties when you urinate, then it only has half the capacity to fill before you have to urinate again.  The nerves in the bladder become more hypersensitive with an increased size of the prostate even if the bladder empties completely.  Pregnancy.  Obesity. Excess weight is more likely to cause a problem for women than for men.  Bladder stones or other bladder problems.  Caffeine.  Alcohol.  Medications. For example, drugs that help the body get rid of extra fluid (diuretics) increase urine production. Some other medicines must be taken with lots of fluids.  Muscle or nerve weakness. This might be the result of a spinal cord injury, a stroke, multiple sclerosis, or Parkinson disease.  Long-standing diabetes can decrease the sensation of the bladder. This loss of sensation makes it harder to sense the bladder needs to be emptied. Over a period of years, the bladder is stretched out by constant overfilling. This weakens the bladder muscles so that the bladder does not empty well and has less capacity to fill with new urine.  Interstitial cystitis (also called painful bladder syndrome). This condition develops because the tissues that line the inside of the bladder are inflamed (inflammation is the body's way of reacting to injury or infection). It causes pain and frequent urination. It occurs in women more often than in men. DIAGNOSIS   To decide what might  be causing your urinary frequency, your health care provider will probably:  Ask about symptoms you have noticed.  Ask about your overall health. This will include questions about any medications you are taking.  Do a physical examination.  Order some tests. These might include:  A blood test to check for diabetes or other health issues that could be contributing to the problem.  Urine testing. This could measure the flow of urine and the pressure on the bladder.  A test of your neurological system (the brain, spinal cord, and nerves). This is the system that senses the need to urinate.  A bladder test to check whether it is emptying completely when you urinate.  Cystoscopy. This test uses a thin tube with a tiny camera on it. It offers a look inside your urethra and bladder to see if there are problems.  Imaging tests. You might be given a contrast dye and then asked to urinate. X-rays are taken to  see how your bladder is working. TREATMENT  It is important for you to be evaluated to determine if the amount or frequency that you have is unusual or abnormal. If it is found to be abnormal, the cause should be determined and this can usually be found out easily. Depending upon the cause, treatment could include medication, stimulation of the nerves, or surgery. There are not too many things that you can do as an individual to change your urinary frequency. It is important that you balance the amount of fluid intake needed to compensate for your activity and the temperature. Medical problems will be diagnosed and taken care of by your physician. There is no particular bladder training such as Kegel exercises that you can do to help urinary frequency. This is an exercise that is usually recommended for people who have leaking of urine when they laugh, cough, or sneeze. HOME CARE INSTRUCTIONS   Take any medications your health care provider prescribed or suggested. Follow the directions  carefully.  Practice any lifestyle changes that are recommended. These might include:  Drinking less fluid or drinking at different times of the day. If you need to urinate often during the night, for example, you may need to stop drinking fluids early in the evening.  Cutting down on caffeine or alcohol. They both can make you need to urinate more often than normal. Caffeine is found in coffee, tea, and sodas.  Losing weight, if that is recommended.  Keep a journal or a log. You might be asked to record how much you drink and when and where you feel the need to urinate. This will also help evaluate how well the treatment provided by your physician is working. SEEK MEDICAL CARE IF:   Your need to urinate often gets worse.  You feel increased pain or irritation when you urinate.  You notice blood in your urine.  You have questions about any medications that your health care provider recommended.  You notice blood, pus, or swelling at the site of any test or treatment procedure.  You develop a fever of more than 100.5F (38.1C). SEEK IMMEDIATE MEDICAL CARE IF:  You develop a fever of more than 102.81F (38.9C).   This information is not intended to replace advice given to you by your health care provider. Make sure you discuss any questions you have with your health care provider.   Document Released: 08/08/2009 Document Revised: 11/02/2014 Document Reviewed: 08/08/2009 Elsevier Interactive Patient Education 2016 [place] DASH stands for "Dietary Approaches to Stop Hypertension." The DASH eating plan is a healthy eating plan that has been shown to reduce high blood pressure (hypertension). Additional health benefits may include reducing the risk of type 2 diabetes mellitus, heart disease, and stroke. The DASH eating plan may also help with weight loss. WHAT DO I NEED TO KNOW ABOUT THE DASH EATING PLAN? For the DASH eating plan, you will follow these general  guidelines:  Choose foods with a percent daily value for sodium of less than 5% (as listed on the food label).  Use salt-free seasonings or herbs instead of table salt or sea salt.  Check with your health care provider or pharmacist before using salt substitutes.  Eat lower-sodium products, often labeled as "lower sodium" or "no salt added."  Eat fresh foods.  Eat more vegetables, fruits, and low-fat dairy products.  Choose whole grains. Look for the word "whole" as the first word in the ingredient list.  Choose  fish and skinless chicken or [place] more often than red meat. Limit fish, poultry, and meat to 6 oz (170 g) each day.  Limit sweets, desserts, sugars, and sugary drinks.  Choose heart-healthy fats.  Limit cheese to 1 oz (28 g) per day.  Eat more home-cooked food and less restaurant, buffet, and fast food.  Limit fried foods.  Cook foods using methods other than frying.  Limit canned vegetables. If you do use them, rinse them well to decrease the sodium.  When eating at a restaurant, ask that your food be prepared with less salt, or no salt if possible. WHAT FOODS CAN I EAT? Seek help from a dietitian for individual calorie needs. Grains Whole grain or whole wheat bread. Brown rice. Whole grain or whole wheat pasta. Quinoa, bulgur, and whole grain cereals. Low-sodium cereals. Corn or whole wheat flour tortillas. Whole grain cornbread. Whole grain crackers. Low-sodium crackers. Vegetables Fresh or frozen vegetables (raw, steamed, roasted, or grilled). Low-sodium or reduced-sodium tomato and vegetable juices. Low-sodium or reduced-sodium tomato sauce and paste. Low-sodium or reduced-sodium canned vegetables.  Fruits All fresh, canned (in natural juice), or frozen fruits. Meat and Other Protein Products Ground beef (85% or leaner), grass-fed beef, or beef trimmed of fat. Skinless chicken or [place]. Ground chicken or [place]. Pork trimmed of fat. All fish and seafood.  Eggs. Dried beans, peas, or lentils. Unsalted nuts and seeds. Unsalted canned beans. Dairy Low-fat dairy products, such as skim or 1% milk, 2% or reduced-fat cheeses, low-fat ricotta or cottage cheese, or plain low-fat yogurt. Low-sodium or reduced-sodium cheeses. Fats and Oils Tub margarines without trans fats. Light or reduced-fat mayonnaise and salad dressings (reduced sodium). Avocado. Safflower, olive, or canola oils. Natural peanut or almond butter. Other Unsalted popcorn and pretzels. The items listed above may not be a complete list of recommended foods or beverages. Contact your dietitian for more options. WHAT FOODS ARE NOT RECOMMENDED? Grains White bread. White pasta. White rice. Refined cornbread. Bagels and croissants. Crackers that contain trans fat. Vegetables Creamed or fried vegetables. Vegetables in a cheese sauce. Regular canned vegetables. Regular canned tomato sauce and paste. Regular tomato and vegetable juices. Fruits Dried fruits. Canned fruit in light or heavy syrup. Fruit juice. Meat and Other Protein Products Fatty cuts of meat. Ribs, chicken wings, bacon, sausage, bologna, salami, chitterlings, fatback, hot dogs, bratwurst, and packaged luncheon meats. Salted nuts and seeds. Canned beans with salt. Dairy Whole or 2% milk, cream, half-and-half, and cream cheese. Whole-fat or sweetened yogurt. Full-fat cheeses or blue cheese. Nondairy creamers and whipped toppings. Processed cheese, cheese spreads, or cheese curds. Condiments Onion and garlic salt, seasoned salt, table salt, and sea salt. Canned and packaged gravies. Worcestershire sauce. Tartar sauce. Barbecue sauce. Teriyaki sauce. Soy sauce, including reduced sodium. Steak sauce. Fish sauce. Oyster sauce. Cocktail sauce. Horseradish. Ketchup and mustard. Meat flavorings and tenderizers. Bouillon cubes. Hot sauce. Tabasco sauce. Marinades. Taco seasonings. Relishes. Fats and Oils Butter, stick margarine, lard,  shortening, ghee, and bacon fat. Coconut, palm kernel, or palm oils. Regular salad dressings. Other Pickles and olives. Salted popcorn and pretzels. The items listed above may not be a complete list of foods and beverages to avoid. Contact your dietitian for more information. WHERE CAN I FIND MORE INFORMATION? National Heart, Lung, and Blood Institute: travelstabloid.com   This information is not intended to replace advice given to you by your health care provider. Make sure you discuss any questions you have with your health care provider.   Document  Released: 10/01/2011 Document Revised: 11/02/2014 Document Reviewed: 08/16/2013 Elsevier Interactive Patient Education 2016 [place] in the Home  Falls can cause injuries. They can happen to people of all ages. There are many things you can do to make your home safe and to help prevent falls.  WHAT CAN I DO ON THE OUTSIDE OF MY HOME?  Regularly fix the edges of walkways and driveways and fix any cracks.  Remove anything that might make you trip as you walk through a door, such as a raised step or threshold.  Trim any bushes or trees on the path to your home.  Use bright outdoor lighting.  Clear any walking paths of anything that might make someone trip, such as rocks or tools.  Regularly check to see if handrails are loose or broken. Make sure that both sides of any steps have handrails.  Any raised decks and porches should have guardrails on the edges.  Have any leaves, snow, or ice cleared regularly.  Use sand or salt on walking paths during winter.  Clean up any spills in your garage right away. This includes oil or grease spills. WHAT CAN I DO IN THE BATHROOM?   Use night lights.  Install grab bars by the toilet and in the tub and shower. Do not use towel bars as grab bars.  Use non-skid mats or decals in the tub or shower.  If you need to sit down in the shower,  use a plastic, non-slip stool.  Keep the floor dry. Clean up any water that spills on the floor as soon as it happens.  Remove soap buildup in the tub or shower regularly.  Attach bath mats securely with double-sided non-slip rug tape.  Do not have throw rugs and other things on the floor that can make you trip. WHAT CAN I DO IN THE BEDROOM?  Use night lights.  Make sure that you have a light by your bed that is easy to reach.  Do not use any sheets or blankets that are too big for your bed. They should not hang down onto the floor.  Have a firm chair that has side arms. You can use this for support while you get dressed.  Do not have throw rugs and other things on the floor that can make you trip. WHAT CAN I DO IN THE KITCHEN?  Clean up any spills right away.  Avoid walking on wet floors.  Keep items that you use a lot in easy-to-reach places.  If you need to reach something above you, use a strong step stool that has a grab bar.  Keep electrical cords out of the way.  Do not use floor polish or wax that makes floors slippery. If you must use wax, use non-skid floor wax.  Do not have throw rugs and other things on the floor that can make you trip. WHAT CAN I DO WITH MY STAIRS?  Do not leave any items on the stairs.  Make sure that there are handrails on both sides of the stairs and use them. Fix handrails that are broken or loose. Make sure that handrails are as long as the stairways.  Check any carpeting to make sure that it is firmly attached to the stairs. Fix any carpet that is loose or worn.  Avoid having throw rugs at the top or bottom of the stairs. If you do have throw rugs, attach them to the floor with carpet tape.  Make sure  that you have a light switch at the top of the stairs and the bottom of the stairs. If you do not have them, ask someone to add them for you. WHAT ELSE CAN I DO TO HELP PREVENT FALLS?  Wear shoes that:  Do not have high heels.  Have  rubber bottoms.  Are comfortable and fit you well.  Are closed at the toe. Do not wear sandals.  If you use a stepladder:  Make sure that it is fully opened. Do not climb a closed stepladder.  Make sure that both sides of the stepladder are locked into place.  Ask someone to hold it for you, if possible.  Clearly mark and make sure that you can see:  Any grab bars or handrails.  First and last steps.  Where the edge of each step is.  Use tools that help you move around (mobility aids) if they are needed. These include:  Canes.  Walkers.  Scooters.  Crutches.  Turn on the lights when you go into a dark area. Replace any light bulbs as soon as they burn out.  Set up your furniture so you have a clear path. Avoid moving your furniture around.  If any of your floors are uneven, fix them.  If there are any pets around you, be aware of where they are.  Review your medicines with your doctor. Some medicines can make you feel dizzy. This can increase your chance of falling. Ask your doctor what other things that you can do to help prevent falls.   This information is not intended to replace advice given to you by your health care provider. Make sure you discuss any questions you have with your health care provider.   Document Released: 08/08/2009 Document Revised: 02/26/2015 Document Reviewed: 11/16/2014 Elsevier Interactive Patient Education 2016 [place] Maintenance, Female Adopting a healthy lifestyle and getting preventive care can go a long way to promote health and wellness. Talk with your health care provider about what schedule of regular examinations is right for you. This is a good chance for you to check in with your provider about disease prevention and staying healthy. In between checkups, there are plenty of things you can do on your own. Experts have done a lot of research about which lifestyle changes and preventive measures are most likely to  keep you healthy. Ask your health care provider for more information. WEIGHT AND DIET  Eat a healthy diet  Be sure to include plenty of vegetables, fruits, low-fat dairy products, and lean protein.  Do not eat a lot of foods high in solid fats, added sugars, or salt.  Get regular exercise. This is one of the most important things you can do for your health.  Most adults should exercise for at least 150 minutes each week. The exercise should increase your heart rate and make you sweat (moderate-intensity exercise).  Most adults should also do strengthening exercises at least twice a week. This is in addition to the moderate-intensity exercise.  Maintain a healthy weight  Body mass index (BMI) is a measurement that can be used to identify possible weight problems. It estimates body fat based on height and weight. Your health care provider can help determine your BMI and help you achieve or maintain a healthy weight.  For females 65 years of age and older:   A BMI below 18.5 is considered underweight.  A BMI of 18.5 to 24.9 is normal.  A BMI of 25 to  29.9 is considered overweight.  A BMI of 30 and above is considered obese.  Watch levels of cholesterol and blood lipids  You should start having your blood tested for lipids and cholesterol at 78 years of age, then have this test every 5 years.  You may need to have your cholesterol levels checked more often if:  Your lipid or cholesterol levels are high.  You are older than 78 years of age.  You are at high risk for heart disease.  CANCER SCREENING   Lung Cancer  Lung cancer screening is recommended for adults 75-12 years old who are at high risk for lung cancer because of a history of smoking.  A yearly low-dose CT scan of the lungs is recommended for people who:  Currently smoke.  Have quit within the past 15 years.  Have at least a 30-pack-year history of smoking. A pack year is smoking an average of one pack of  cigarettes a day for 1 year.  Yearly screening should continue until it has been 15 years since you quit.  Yearly screening should stop if you develop a health problem that would prevent you from having lung cancer treatment.  Breast Cancer  Practice breast self-awareness. This means understanding how your breasts normally appear and feel.  It also means doing regular breast self-exams. Let your health care provider know about any changes, no matter how small.  If you are in your 20s or 30s, you should have a clinical breast exam (CBE) by a health care provider every 1-3 years as part of a regular health exam.  If you are 21 or older, have a CBE every year. Also consider having a breast X-ray (mammogram) every year.  If you have a family history of breast cancer, talk to your health care provider about genetic screening.  If you are at high risk for breast cancer, talk to your health care provider about having an MRI and a mammogram every year.  Breast cancer gene (BRCA) assessment is recommended for women who have family members with BRCA-related cancers. BRCA-related cancers include:  Breast.  Ovarian.  Tubal.  Peritoneal cancers.  Results of the assessment will determine the need for genetic counseling and BRCA1 and BRCA2 testing. Cervical Cancer Your health care provider may recommend that you be screened regularly for cancer of the pelvic organs (ovaries, uterus, and vagina). This screening involves a pelvic examination, including checking for microscopic changes to the surface of your cervix (Pap test). You may be encouraged to have this screening done every 3 years, beginning at age 14.  For women ages 95-65, health care providers may recommend pelvic exams and Pap testing every 3 years, or they may recommend the Pap and pelvic exam, combined with testing for human papilloma virus (HPV), every 5 years. Some types of HPV increase your risk of cervical cancer. Testing for HPV  may also be done on women of any age with unclear Pap test results.  Other health care providers may not recommend any screening for nonpregnant women who are considered low risk for pelvic cancer and who do not have symptoms. Ask your health care provider if a screening pelvic exam is right for you.  If you have had past treatment for cervical cancer or a condition that could lead to cancer, you need Pap tests and screening for cancer for at least 20 years after your treatment. If Pap tests have been discontinued, your risk factors (such as having a new sexual partner) need  to be reassessed to determine if screening should resume. Some women have medical problems that increase the chance of getting cervical cancer. In these cases, your health care provider may recommend more frequent screening and Pap tests. Colorectal Cancer  This type of cancer can be detected and often prevented.  Routine colorectal cancer screening usually begins at 78 years of age and continues through 78 years of age.  Your health care provider may recommend screening at an earlier age if you have risk factors for colon cancer.  Your health care provider may also recommend using home test kits to check for hidden blood in the stool.  A small camera at the end of a tube can be used to examine your colon directly (sigmoidoscopy or colonoscopy). This is done to check for the earliest forms of colorectal cancer.  Routine screening usually begins at age 3.  Direct examination of the colon should be repeated every 5-10 years through 78 years of age. However, you may need to be screened more often if early forms of precancerous polyps or small growths are found. Skin Cancer  Check your skin from head to toe regularly.  Tell your health care provider about any new moles or changes in moles, especially if there is a change in a mole's shape or color.  Also tell your health care provider if you have a mole that is larger than  the size of a pencil eraser.  Always use sunscreen. Apply sunscreen liberally and repeatedly throughout the day.  Protect yourself by wearing long sleeves, pants, a wide-brimmed hat, and sunglasses whenever you are outside. HEART DISEASE, DIABETES, AND HIGH BLOOD PRESSURE   High blood pressure causes heart disease and increases the risk of stroke. High blood pressure is more likely to develop in:  People who have blood pressure in the high end of the normal range (130-139/85-89 mm Hg).  People who are overweight or obese.  People who are African American.  If you are 22-39 years of age, have your blood pressure checked every 3-5 years. If you are 16 years of age or older, have your blood pressure checked every year. You should have your blood pressure measured twice--once when you are at a hospital or clinic, and once when you are not at a hospital or clinic. Record the average of the two measurements. To check your blood pressure when you are not at a hospital or clinic, you can use:  An automated blood pressure machine at a pharmacy.  A home blood pressure monitor.  If you are between 64 years and 42 years old, ask your health care provider if you should take aspirin to prevent strokes.  Have regular diabetes screenings. This involves taking a blood sample to check your fasting blood sugar level.  If you are at a normal weight and have a low risk for diabetes, have this test once every three years after 78 years of age.  If you are overweight and have a high risk for diabetes, consider being tested at a younger age or more often. PREVENTING INFECTION  Hepatitis B  If you have a higher risk for hepatitis B, you should be screened for this virus. You are considered at high risk for hepatitis B if:  You were born in a country where hepatitis B is common. Ask your health care provider which countries are considered high risk.  Your parents were born in a high-risk country, and you  have not been immunized against hepatitis B (hepatitis  B vaccine).  You have HIV or AIDS.  You use needles to inject street drugs.  You live with someone who has hepatitis B.  You have had sex with someone who has hepatitis B.  You get hemodialysis treatment.  You take certain medicines for conditions, including cancer, organ transplantation, and autoimmune conditions. Hepatitis C  Blood testing is recommended for:  Everyone born from [birth year] through 1963.  Anyone with known risk factors for hepatitis C. Sexually transmitted infections (STIs)  You should be screened for sexually transmitted infections (STIs) including gonorrhea and chlamydia if:  You are sexually active and are younger than 78 years of age.  You are older than 78 years of age and your health care provider tells you that you are at risk for this type of infection.  Your sexual activity has changed since you were last screened and you are at an increased risk for chlamydia or gonorrhea. Ask your health care provider if you are at risk.  If you do not have HIV, but are at risk, it may be recommended that you take a prescription medicine daily to prevent HIV infection. This is called pre-exposure prophylaxis (PrEP). You are considered at risk if:  You are sexually active and do not regularly use condoms or know the HIV status of your partner(s).  You take drugs by injection.  You are sexually active with a partner who has HIV. Talk with your health care provider about whether you are at high risk of being infected with HIV. If you choose to begin PrEP, you should first be tested for HIV. You should then be tested every 3 months for as long as you are taking PrEP.  PREGNANCY   If you are premenopausal and you may become pregnant, ask your health care provider about preconception counseling.  If you may become pregnant, take 400 to 800 micrograms (mcg) of folic acid every day.  If you want to prevent pregnancy,  talk to your health care provider about birth control (contraception). OSTEOPOROSIS AND MENOPAUSE   Osteoporosis is a disease in which the bones lose minerals and strength with aging. This can result in serious bone fractures. Your risk for osteoporosis can be identified using a bone density scan.  If you are 55 years of age or older, or if you are at risk for osteoporosis and fractures, ask your health care provider if you should be screened.  Ask your health care provider whether you should take a calcium or vitamin D supplement to lower your risk for osteoporosis.  Menopause may have certain physical symptoms and risks.  Hormone replacement therapy may reduce some of these symptoms and risks. Talk to your health care provider about whether hormone replacement therapy is right for you.  HOME CARE INSTRUCTIONS   Schedule regular health, dental, and eye exams.  Stay current with your immunizations.   Do not use any tobacco products including cigarettes, chewing tobacco, or electronic cigarettes.  If you are pregnant, do not drink alcohol.  If you are breastfeeding, limit how much and how often you drink alcohol.  Limit alcohol intake to no more than 1 drink per day for nonpregnant women. One drink equals 12 ounces of beer, 5 ounces of wine, or 1 ounces of hard liquor.  Do not use street drugs.  Do not share needles.  Ask your health care provider for help if you need support or information about quitting drugs.  Tell your health care provider if you often feel depressed.  Tell your health care provider if you have ever been abused or do not feel safe at home.   This information is not intended to replace advice given to you by your health care provider. Make sure you discuss any questions you have with your health care provider.   Document Released: 04/27/2011 Document Revised: 11/02/2014 Document Reviewed: 09/13/2013 Elsevier Interactive Patient Education 2016 [employer].  Hearing Loss Hearing loss is a partial or total loss of the ability to hear. This can be temporary or permanent, and it can happen in one or both ears. Hearing loss may be referred to as deafness. Medical care is necessary to treat hearing loss properly and to prevent the condition from getting worse. Your hearing may partially or completely come back, depending on what caused your hearing loss and how severe it is. In some cases, hearing loss is permanent. CAUSES Common causes of hearing loss include:   Too much wax in the ear canal.   Infection of the ear canal or middle ear.   Fluid in the middle ear.   Injury to the ear or surrounding area.   An object stuck in the ear.   Prolonged exposure to loud sounds, such as music.  Less common causes of hearing loss include:   Tumors in the ear.   Viral or bacterial infections, such as meningitis.   A hole in the eardrum (perforated eardrum).  Problems with the hearing nerve that sends signals between the brain and the ear.  Certain medicines.  SYMPTOMS  Symptoms of this condition may include:  Difficulty telling the difference between sounds.  Difficulty following a conversation when there is background noise.  Lack of response to sounds in your environment. This may be most noticeable when you do not respond to startling sounds.  Needing to turn up the volume on the television, radio, etc.  Ringing in the ears.  Dizziness.  Pain in the ears. DIAGNOSIS This condition is diagnosed based on a physical exam and a hearing test (audiometry). The audiometry test will be performed by a hearing specialist (audiologist). You may also be referred to an ear, nose, and throat (ENT) specialist (otolaryngologist).  TREATMENT Treatment for recent onset of hearing loss may include:   Ear wax removal.   Being prescribed medicines to prevent infection (antibiotics).   Being prescribed medicines to reduce inflammation  (corticosteroids).  HOME CARE INSTRUCTIONS  If you were prescribed an antibiotic medicine, take it as told by your health care provider. Do not stop taking the antibiotic even if you start to feel better.  Take over-the-counter and prescription medicines only as told by your health care provider.  Avoid loud noises.   Return to your normal activities as told by your health care provider. Ask your health care provider what activities are safe for you.  Keep all follow-up visits as told by your health care provider. This is important. SEEK MEDICAL CARE IF:   You feel dizzy.   You develop new symptoms.   You vomit or feel nauseous.   You have a fever.  SEEK IMMEDIATE MEDICAL CARE IF:  You develop sudden changes in your vision.   You have severe ear pain.   You have new or increased weakness.  You have a severe headache.   This information is not intended to replace advice given to you by your health care provider. Make sure you discuss any questions you have with your health care provider.   Document Released: 10/12/2005 Document  Revised: 07/03/2015 Document Reviewed: 02/27/2015 Elsevier Interactive Patient Education 2016 Elsevier In

## 2016-01-21 NOTE — Progress Notes (Addendum)
Subjective:   [name] is a 80 y.o. female who presents for Medicare Annual (Subsequent) preventive examination.  Review of Systems:   HRA assessment completed during visit; Cortina, Aryka  The Patient was informed that this wellness visit is to identify risk and educate on how to reduce risk for increase disease through lifestyle changes.   ROS deferred to CPE exam with physician  Medical and family hx Brother lung cancer Mother had colon cancer  Maternal aunt had colon cancer Son passed away x 7 yo; had MI/  Has a dtr still living;  Did have BP issues after son passed  Medical issues  Rh Arthritis Malig neoplasm breast; left; mammogram q 6 months Morbid obesity (BMI was 29) Neoplasm of the breast x 80 yo; left breast; on Tamoxifen 28m q d Lipids; 11/25/2015 cho 134; Trig 155; HDL 41; LDL 62/ Ratio 3 (A1c 5.6)  No tobacco; did not smoke  No ETOH; drank some but stopped very young   BMI: 27.3  Diet;  Tries to eat healthy;  Start with breakfast meats but stopped due to the sodium content Now eats oatmeal; cereal; seasons her ground [place] Rarely eats lunch;  Trying to watch bread; buying crackers; Ritz crackers;  Eats a lot of fresh vegetables / discussed sodium and salt in processed crackers;  WASA  crackers; rye or 100% wheat bread; also ritz high in fat;   Exercise;  [facility];  [place];  Does  Equipment for strengthening; 2 to 3 times a week;    SAFETY; Dtr would like her to move in with her but she prefers to remain independent;  No issues or failures of independent living   Safety reviewed for the home; one level home Removal of clutter clearing paths through the home,  Railing as needed;  Bathroom safety; has a stool in bathtub as her son was afraid of falls Had small rugs in bathroom but has moved them  Has good family that is supportive  CWarehouse manager YES Smoke  detectors yes Firearms safety / doesn't have firearms Driving accidents and seatbelt/ not in the last year;   Sun protection/ maybe in the summer;   Hard time sleeping after son died;   Medication review/ no issues  Fall assessment; no Gait assessment/ get up and go is good  Mobilization and Functional losses in the last year/ uses cane;  Hold on to the rail when going up and down stars   Urinary or fecal incontinence reviewed/ no Little problem with the leakage; sometimes when coughing When she goes to the bathroom; has spoke to the doctor about this ( GYN)  Kagel exercises;   Memory/ good AD8 score 0   Depression; spends time baking and staying busy  Counseling: Colonoscopy; 07/2010 EKG: 02/2015 Mammogram 03/27/2015 fup in 6 months due to stable calcifications Repeat: 10/2015 Dexa 02/2013 normal; Doctor told her she had strong bones  PAP; Still sees the GYN; apt in June 2017  Hearing: no issues Ophthalmology exam; June 2017 Dr. gCherylann BanasImmunizations; Tdap; discusssed deferred today Zostavax/ educated regarding shingles; given information and doesn't think she had chicken pox. Discussed having a titer drawn prior to shingles vaccine if she would like  Advanced Directive; YES  Health advice or referrals Discussed exercise and weight loss Given information on sodium   Current Care Team reviewed and updated       Objective:  Vitals: BP 134/64 mmHg  Ht 5' 4.5" (1.638 m)  Wt 159 lb 4 oz (72.235 kg)  BMI 26.92 kg/m2  Body mass index is 26.92 kg/(m^2).   Tobacco History  Smoking status  . Never Smoker   Smokeless tobacco  . Never Used     Counseling given: Yes   Past Medical History  Diagnosis Date  . Colon polyp   . Chronic rhinitis   . Morbid obesity ([place])   . DJD (degenerative joint disease)   . Hypertension   . Chronic renal insufficiency   . Memory loss   . Anemia   . GERD (gastroesophageal reflux disease)   . Wears glasses   . Full  dentures   . Impaired glucose tolerance 07/18/2014  . Breast cancer of upper-outer quadrant of left female breast ([place]) 11/08/2013    ER/PR+ Her2- Left IDC    Past Surgical History  Procedure Laterality Date  . Cataract extraction  2009    rt  . Vesicovaginal fistula closure w/ tah  1980  . Total knee arthroplasty  2002    rt  . Total knee arthroplasty  2003    left  . Abdominal hysterectomy    . Tonsillectomy    . Knee arthroscopy      both  . Colonoscopy    . Breast lumpectomy with needle localization and axillary sentinel lymph node bx Left 12/04/2013    Procedure: BREAST LUMPECTOMY WITH NEEDLE LOCALIZATION AND AXILLARY SENTINEL LYMPH NODE BX;  Surgeon: Shann Medal, MD;  Location: [facility];  Service: General;  Laterality: Left;   Family History  Problem Relation Age of Onset  . Heart disease      remote family  . Lung cancer Brother     was a smoker  . Colon cancer Mother   . Colon cancer Maternal Aunt    History  Sexual Activity  . Sexual Activity: Not Currently    Outpatient Encounter Prescriptions as of 01/21/2016  Medication Sig  . aspirin 81 MG tablet Take 81 mg by mouth daily.    . Cholecalciferol (VITAMIN D3) 2000 UNITS TABS Take 1 tablet by mouth daily.   . famotidine (PEPCID) 20 MG tablet Take 20 mg by mouth at bedtime.  . fexofenadine (ALLEGRA) 180 MG tablet Take 180 mg by mouth daily as needed.    [name] glucosamine-chondroitin 500-400 MG tablet Take 1 tablet by mouth daily.   . Multiple Vitamins-Minerals (CENTRUM SILVER PO) Take 1 tablet by mouth daily.    [name] omeprazole (PRILOSEC) 20 MG capsule Take 20 mg by mouth daily.  . Potassium Gluconate 550 (90 K) MG TABS Take 1 tablet by mouth daily.  . Simethicone (GAS-X PO) Take by mouth. Take as directed   . tamoxifen (NOLVADEX) 20 MG tablet TAKE 1 TABLET DAILY.  [name] tiZANidine (ZANAFLEX) 4 MG tablet Take 1 tablet (4 mg total) by mouth every 6 (six) hours as needed for muscle spasms.  . traMADol  (ULTRAM) 50 MG tablet TAKE 1 TABLET EVERY 6 HOURS AS NEEDED  . valsartan-hydrochlorothiazide (DIOVAN HCT) 320-25 MG per tablet Take 1 tablet by mouth daily.  [name] atropine 1 % ophthalmic solution Reported on 01/21/2016  . predniSONE (DELTASONE) 10 MG tablet 3 tabs by mouth per day for 3 days,2tabs per day for 3 days,1tab per day for 3 days (Patient not taking: Reported on 01/21/2016)   No facility-administered encounter medications on file as of 01/21/2016.    Activities of Daily Living No flowsheet  data found.  Patient Care Team: Biagio Borg, MD as PCP - General (Internal Medicine) Chauncey Cruel, MD as Consulting Physician (Oncology) [name], MD as Consulting Physician (Radiation Oncology) [name], MD as Consulting Physician (Obstetrics and Gynecology) Nanci Pina, FNP as Nurse Practitioner (Psychiatry) [name], MD as Consulting Physician (Radiology)    Assessment:    Exercise Activities and Dietary recommendations    Goals    . patient     Discussed Water aerobics in June;  Continue to learn and watch sodium intake      Fall Risk Fall Risk  11/28/2015 03/15/2015 11/27/2014 02/20/2014  Falls in the past year? No No No No   Depression Screen PHQ 2/9 Scores 11/28/2015 03/15/2015 11/27/2014 02/20/2014  PHQ - 2 Score 0 0 0 0     Cognitive Testing No flowsheet data found.  AD8 score 0; affect appropriate; good historian   Immunization History  Administered Date(s) Administered  . Influenza Split 08/07/2011, 10/13/2012  . Influenza Whole 07/27/2008, 08/14/2009, 07/01/2010  . Influenza,inj,Quad PF,36+ Mos 08/28/2013, 07/18/2014, 07/26/2015  . Pneumococcal Conjugate-13 02/20/2014  . Pneumococcal Polysaccharide-23 10/26/2005   Screening Tests Health Maintenance  Topic Date Due  . TETANUS/TDAP  10/30/1956  . ZOSTAVAX  10/30/1997  . INFLUENZA VACCINE  05/26/2016  . DEXA SCAN  Completed  . PNA vac Low Risk Adult  Completed      Plan:  Will have  hearing checked;  Deaf & Hard of Hearing Division Services / will pay for one hearing aid No reviews  [employer] Office  [place] [address]  [phone]  Will fup with GYN in June  Educated to check with insurance regarding coverage of Shingles vaccination on Part D or Part B and may have lower co-pay if provided on the Part D side/ Also discuss drawing a titer as she does not feel she had chicken pox  Given information on kagel exercises and UA frequency  Given information on the DASH eating plan   Given General info on health maintenance    During the course of the visit the patient was educated and counseled about the following appropriate screening and preventive services:   Vaccines to include Pneumoccal, Influenza, Hepatitis B, Td, Zostavax, HCV  Deferred Tdap today  Electrocardiogram/ 02/2015  Cardiovascular Disease/ BP good / BMI 27;   Colorectal cancer screening/ aged out   Bone density screening / no; exam 2014 was normal   Diabetes screening/ neg  Glaucoma screening/ To see Dr. Carolynn Sayers in June   Mammography/ Jan 2017  Nutrition counseling / low sodium; healthy diet;   Patient Instructions (the written plan) was given to the patient.   Wynetta Fines, RN  01/21/2016   Medical screening examination/treatment/procedure(s) were performed by non-physician practitioner and as supervising physician I was immediately available for consultation/collaboration. I agree with above. Cathlean Cower, MD

## 2016-02-25 ENCOUNTER — Telehealth: Payer: Self-pay | Admitting: Internal Medicine

## 2016-02-25 DIAGNOSIS — M549 Dorsalgia, unspecified: Secondary | ICD-10-CM

## 2016-02-25 NOTE — Telephone Encounter (Signed)
Referral done

## 2016-02-25 NOTE — Telephone Encounter (Signed)
Pt request referral for a chiropractor due to back pain. Please advise, does pt need ov with Dr. Jenny Reichmann first?

## 2016-02-27 NOTE — Telephone Encounter (Signed)
Patient call to give the info that she is going to doctor Zenovia Jarred on Monday May 8 @845 . [name] you all needed to know that.

## 2016-02-27 NOTE — Telephone Encounter (Signed)
Noted thanks °

## 2016-03-03 ENCOUNTER — Telehealth: Payer: Self-pay | Admitting: Internal Medicine

## 2016-03-03 NOTE — Telephone Encounter (Signed)
Patient is requesting [facility] referral for hearing test to be sent to [facility] faxed to [phone].

## 2016-03-06 NOTE — Telephone Encounter (Signed)
Spoke with pt and she is aware and seen someone else

## 2016-03-06 NOTE — Telephone Encounter (Signed)
Spoke with Michelene Heady at [facility] and they are out of network with every insurance except Medicare part B They will still see her, however she will need to be self pay. If pt still interested she can contact Michelene Heady at [phone] to schedule.

## 2016-03-10 ENCOUNTER — Ambulatory Visit (INDEPENDENT_AMBULATORY_CARE_PROVIDER_SITE_OTHER): Payer: Commercial Managed Care - HMO | Admitting: Internal Medicine

## 2016-03-10 ENCOUNTER — Encounter: Payer: Self-pay | Admitting: Internal Medicine

## 2016-03-10 ENCOUNTER — Ambulatory Visit (INDEPENDENT_AMBULATORY_CARE_PROVIDER_SITE_OTHER)
Admission: RE | Admit: 2016-03-10 | Discharge: 2016-03-10 | Disposition: A | Discharge status: Home / Self Care | Payer: Commercial Managed Care - HMO | Source: Ambulatory Visit | Attending: Internal Medicine | Admitting: Internal Medicine

## 2016-03-10 VITALS — BP 150/80 | HR 70 | Temp 98.2°F | Resp 20 | Wt 159.0 lb

## 2016-03-10 DIAGNOSIS — I10 Essential (primary) hypertension: Secondary | ICD-10-CM | POA: Diagnosis not present

## 2016-03-10 DIAGNOSIS — R7302 Impaired glucose tolerance (oral): Secondary | ICD-10-CM

## 2016-03-10 DIAGNOSIS — M542 Cervicalgia: Secondary | ICD-10-CM | POA: Diagnosis not present

## 2016-03-10 DIAGNOSIS — M545 Low back pain, unspecified: Secondary | ICD-10-CM

## 2016-03-10 NOTE — Patient Instructions (Signed)
Please continue all other medications as before, and refills have been done if requested.  Please have the pharmacy call with any other refills you may need.  Please continue your efforts at being more active, low cholesterol diet, and weight control.  You are otherwise up to date with prevention measures today.  Please keep your appointments with your specialists as you may have planned  Please go to the XRAY Department in the Basement (go straight as you get off the elevator) for the x-ray testing  You will be contacted by phone if any changes need to be made immediately.  Otherwise, you will receive a letter about your results with an explanation, but please check with MyChart first.  Please remember to sign up for MyChart if you have not done so, as this will be important to you in the future with finding out test results, communicating by private email, and scheduling acute appointments online when needed.

## 2016-03-10 NOTE — Assessment & Plan Note (Signed)
Mild to mod, suspect underlying DJD/DDD, for films today as requested as have not been done to date

## 2016-03-10 NOTE — Assessment & Plan Note (Signed)
stable overall by history and exam, recent data reviewed with pt, and pt to continue medical treatment as before,  to f/u any worsening symptoms or concerns Lab Results  Component Value Date   HGBA1C 5.6 11/25/2015

## 2016-03-10 NOTE — Progress Notes (Signed)
Subjective:    Patient ID: [name], female    DOB: 1936/08/28, 80 y.o.   MRN: [id number]  HPI  Here with worsening graudaly now mod pain to neck posteriorly with some radiation to the bilat upper back No clear radicular pain/numbweakness to UE's..   Pt continues to have recurring LBP without change in severity, bowel or bladder change, fever, wt loss,  worsening LE pain/numbness/weakness, gait change or falls.Has most recently seen chiropracter who sends a note asking for her to have cspine and LS spine films. Has not had recent films.   Has seen Dr Lannette Donath.  BP has been better controlled, is somewhat high, rushed to get here, and pain worsening. BP Readings from Last 3 Encounters:  03/10/16 150/80  01/21/16 134/64  11/28/15 136/80  Pt denies new neurological symptoms such as new headache, or facial or extremity weakness or numbness except for the above.   Pt denies polydipsia, polyuria,  Pt states overall good compliance with meds, trying to follow lower cholesterol diet, wt overall stable Past Medical History  Diagnosis Date  . Colon polyp   . Chronic rhinitis   . Morbid obesity ([place])   . DJD (degenerative joint disease)   . Hypertension   . Chronic renal insufficiency   . Memory loss   . Anemia   . GERD (gastroesophageal reflux disease)   . Wears glasses   . Full dentures   . Impaired glucose tolerance 07/18/2014  . Breast cancer of upper-outer quadrant of left female breast ([place]) 11/08/2013    ER/PR+ Her2- Left IDC    Past Surgical History  Procedure Laterality Date  . Cataract extraction  2009    rt  . Vesicovaginal fistula closure w/ tah  1980  . Total knee arthroplasty  2002    rt  . Total knee arthroplasty  2003    left  . Abdominal hysterectomy    . Tonsillectomy    . Knee arthroscopy      both  . Colonoscopy    . Breast lumpectomy with needle localization and axillary sentinel lymph node bx Left 12/04/2013    Procedure: BREAST LUMPECTOMY WITH NEEDLE  LOCALIZATION AND AXILLARY SENTINEL LYMPH NODE BX;  Surgeon: Shann Medal, MD;  Location: [facility];  Service: General;  Laterality: Left;    reports that she has never smoked. She has never used smokeless tobacco. She reports that she does not drink alcohol or use illicit drugs. family history includes Colon cancer in her maternal aunt and mother; Lung cancer in her brother. Allergies  Allergen Reactions  . Iron     REACTION: hives/whelps/bad constipation   Current Outpatient Prescriptions on File Prior to Visit  Medication Sig Dispense Refill  . aspirin 81 MG tablet Take 81 mg by mouth daily.      [name] atropine 1 % ophthalmic solution Reported on 01/21/2016    . Cholecalciferol (VITAMIN D3) 2000 UNITS TABS Take 1 tablet by mouth daily.     . famotidine (PEPCID) 20 MG tablet Take 20 mg by mouth at bedtime.    . fexofenadine (ALLEGRA) 180 MG tablet Take 180 mg by mouth daily as needed.      [name] glucosamine-chondroitin 500-400 MG tablet Take 1 tablet by mouth daily.     . Multiple Vitamins-Minerals (CENTRUM SILVER PO) Take 1 tablet by mouth daily.      [name] omeprazole (PRILOSEC) 20 MG capsule Take 20 mg by mouth daily.    . Potassium Gluconate  550 (90 K) MG TABS Take 1 tablet by mouth daily.    . Simethicone (GAS-X PO) Take by mouth. Take as directed     . tamoxifen (NOLVADEX) 20 MG tablet TAKE 1 TABLET DAILY. 90 tablet 3  . traMADol (ULTRAM) 50 MG tablet TAKE 1 TABLET EVERY 6 HOURS AS NEEDED 120 tablet 2  . valsartan-hydrochlorothiazide (DIOVAN HCT) 320-25 MG per tablet Take 1 tablet by mouth daily. 90 tablet 3   No current facility-administered medications on file prior to visit.   Review of Systems  Constitutional: Negative for unusual diaphoresis or night sweats HENT: Negative for ear swelling or discharge Eyes: Negative for worsening visual haziness  Respiratory: Negative for choking and stridor.   Gastrointestinal: Negative for distension or worsening  eructation Genitourinary: Negative for retention or change in urine volume.  Musculoskeletal: Negative for other MSK pain or swelling Skin: Negative for color change and worsening wound Neurological: Negative for tremors and numbness other than noted  Psychiatric/Behavioral: Negative for decreased concentration or agitation other than above       Objective:   Physical Exam BP 150/80 mmHg  Pulse 70  Temp(Src) 98.2 F (36.8 C) (Oral)  Resp 20  Wt 159 lb (72.122 kg)  SpO2 96% VS noted,  Constitutional: Pt appears in no apparent distress HENT: Head: NCAT.  Right Ear: External ear normal.  Left Ear: External ear normal.  Eyes: . Pupils are equal, round, and reactive to light. Conjunctivae and EOM are normal Neck: Normal range of motion. Neck supple.  Cardiovascular: Normal rate and regular rhythm.   Pulmonary/Chest: Effort normal and breath sounds without rales or wheezing.  Neurological: Pt is alert. Not confused , motor grossly intact Skin: Skin is warm. No rash, no LE edema Psychiatric: Pt behavior is normal. No agitation.  Spine: nontender throughout       Assessment & Plan:

## 2016-03-10 NOTE — Assessment & Plan Note (Signed)
Mild elev but suspect situational only, cont same tx, o/w stable overall by history and exam, recent data reviewed with pt, and pt to continue medical treatment as before,  to f/u any worsening symptoms or concerns le

## 2016-03-10 NOTE — Progress Notes (Signed)
Pre visit review using our clinic review tool, if applicable. No additional management support is needed unless otherwise documented below in the visit note. 

## 2016-04-16 ENCOUNTER — Encounter: Payer: Self-pay | Admitting: Oncology

## 2016-04-22 NOTE — Telephone Encounter (Signed)
Talked with pt. Informed her I faxed a referral form from [facility] to [place]. They will fill out form and fax to [place].

## 2016-04-22 NOTE — Telephone Encounter (Signed)
Patient is requesting [employer] referral to [facility] for a hearing test for hearing aids.  Also, sending patient to [facility].

## 2016-04-22 NOTE — Telephone Encounter (Signed)
Please follow back up with patient today.

## 2016-05-04 ENCOUNTER — Telehealth: Payer: Self-pay | Admitting: *Deleted

## 2016-05-04 NOTE — Telephone Encounter (Signed)
"  Calling because I'm not sure when my next appointment is and do not want to miss it."   Next appointment information provided and notified her to expect a call with any changes as the scheduled provider is no longer at [facility].

## 2016-05-05 ENCOUNTER — Other Ambulatory Visit: Payer: Self-pay | Admitting: Internal Medicine

## 2016-05-05 NOTE — Telephone Encounter (Signed)
Done hardcopy to corinne 

## 2016-05-05 NOTE — Telephone Encounter (Signed)
Refill sent to pharmacy.   

## 2016-05-07 ENCOUNTER — Telehealth: Payer: Self-pay | Admitting: Internal Medicine

## 2016-05-07 ENCOUNTER — Telehealth: Payer: Self-pay

## 2016-05-07 DIAGNOSIS — M545 Low back pain: Secondary | ICD-10-CM

## 2016-05-07 NOTE — Telephone Encounter (Signed)
Referral done

## 2016-05-07 NOTE — Telephone Encounter (Signed)
Patient called back and needs and referral to be about to go to her chiropractor it is the [facility] on [address]. [phone] is the phone number. Please follow up, Thank you.

## 2016-05-07 NOTE — Telephone Encounter (Signed)
Please advise 

## 2016-05-08 NOTE — Telephone Encounter (Signed)
Please advise 

## 2016-05-08 NOTE — Telephone Encounter (Signed)
Tramadol too soon, since was just done July 11 hardcopy to corinne I believe

## 2016-05-12 NOTE — Telephone Encounter (Signed)
Patient does not know about a script for tramadol on 7/11.  States [employer] does not have a script either.  Please follow up with patient in regard.

## 2016-05-18 ENCOUNTER — Other Ambulatory Visit: Payer: Self-pay | Admitting: Internal Medicine

## 2016-05-19 NOTE — Telephone Encounter (Signed)
tramaol rx not approp since last rx for 3 mo tx was done July 11

## 2016-05-25 MED ORDER — VALSARTAN-HYDROCHLOROTHIAZIDE 320-25 MG PO TABS: 1.0000 | ORAL_TABLET | Freq: Every day | ORAL | 2 refills | Status: DC

## 2016-05-25 NOTE — Telephone Encounter (Signed)
Called [employer] spoke to cheryl/pharmacist she stated she see where they had requested to have med filled, but they ave not receive fax back. Gave verbal authorization from 7/11 to fill for pt. Notified pt w/ status...[name]

## 2016-05-25 NOTE — Telephone Encounter (Signed)
Pt call again concerning refill on her Tramadol. Inform pt per chart MD approved on 7/11, and was fax to [facility]. I will call [employer] to check if rx was received. Called [employer] spoke w/[name]

## 2016-05-25 NOTE — Addendum Note (Signed)
Addended by: Earnstine Regal on: 05/25/2016 01:06 PM   Modules accepted: Orders

## 2016-06-02 ENCOUNTER — Ambulatory Visit (INDEPENDENT_AMBULATORY_CARE_PROVIDER_SITE_OTHER): Payer: Commercial Managed Care - HMO | Admitting: Internal Medicine

## 2016-06-02 ENCOUNTER — Encounter: Payer: Self-pay | Admitting: Internal Medicine

## 2016-06-02 ENCOUNTER — Other Ambulatory Visit (INDEPENDENT_AMBULATORY_CARE_PROVIDER_SITE_OTHER): Payer: Commercial Managed Care - HMO

## 2016-06-02 VITALS — BP 160/82 | HR 84 | Temp 98.2°F | Resp 20 | Wt 157.0 lb

## 2016-06-02 DIAGNOSIS — N183 Chronic kidney disease, stage 3 unspecified: Secondary | ICD-10-CM

## 2016-06-02 DIAGNOSIS — M25579 Pain in unspecified ankle and joints of unspecified foot: Secondary | ICD-10-CM | POA: Insufficient documentation

## 2016-06-02 DIAGNOSIS — Z0001 Encounter for general adult medical examination with abnormal findings: Secondary | ICD-10-CM

## 2016-06-02 DIAGNOSIS — R7302 Impaired glucose tolerance (oral): Secondary | ICD-10-CM

## 2016-06-02 DIAGNOSIS — R6889 Other general symptoms and signs: Secondary | ICD-10-CM

## 2016-06-02 DIAGNOSIS — I10 Essential (primary) hypertension: Secondary | ICD-10-CM

## 2016-06-02 DIAGNOSIS — M25572 Pain in left ankle and joints of left foot: Secondary | ICD-10-CM

## 2016-06-02 LAB — HEPATIC FUNCTION PANEL
ALT: 16 U/L (ref 0–35)
AST: 22 U/L (ref 0–37)
Albumin: 3.9 g/dL (ref 3.5–5.2)
Alkaline Phosphatase: 41 U/L (ref 39–117)
Bilirubin, Direct: 0.1 mg/dL (ref 0.0–0.3)
Total Bilirubin: 0.4 mg/dL (ref 0.2–1.2)
Total Protein: 7.3 g/dL (ref 6.0–8.3)

## 2016-06-02 LAB — BASIC METABOLIC PANEL
BUN: 23 mg/dL (ref 6–23)
CO2: 28 mEq/L (ref 19–32)
Calcium: 9.7 mg/dL (ref 8.4–10.5)
Chloride: 100 mEq/L (ref 96–112)
Creatinine, Ser: 1.4 mg/dL — ABNORMAL HIGH (ref 0.40–1.20)
GFR: 46.7 mL/min — ABNORMAL LOW (ref >60.00)
Glucose, Bld: 72 mg/dL (ref 70–99)
Potassium: 4.2 mEq/L (ref 3.5–5.1)
Sodium: 136 mEq/L (ref 135–145)

## 2016-06-02 LAB — LIPID PANEL
Cholesterol: 152 mg/dL (ref 0–200)
HDL: 52.5 mg/dL (ref >39.00)
LDL Cholesterol: 76 mg/dL (ref 0–99)
NonHDL: 99.67
Total CHOL/HDL Ratio: 3
Triglycerides: 117 mg/dL (ref 0.0–149.0)
VLDL: 23.4 mg/dL (ref 0.0–40.0)

## 2016-06-02 LAB — HEMOGLOBIN A1C: Hgb A1c MFr Bld: 5.4 % (ref 4.6–6.5)

## 2016-06-02 MED ORDER — AMLODIPINE BESYLATE 5 MG PO TABS: 5.0000 mg | ORAL_TABLET | Freq: Every day | ORAL | 3 refills | Status: DC

## 2016-06-02 NOTE — Assessment & Plan Note (Signed)
Chronic persistent, does not want surgury, ok to refer sport medicine for eval

## 2016-06-02 NOTE — Patient Instructions (Signed)
Please take all new medication as prescribed - the amlodipine 5 mg per day  (sent to [facility])  Please continue all other medications as before, and refills have been done if requested.  Please have the pharmacy call with any other refills you may need.  Please continue your efforts at being more active, low cholesterol diet, and weight control.  You are otherwise up to date with prevention measures today.  You will be contacted regarding the referral for: Dr Tamala Julian in this office (and you can make an appt at the desk before leaving today)  Please keep your appointments with your specialists as you may have planned  Please go to the LAB in the Basement (turn left off the elevator) for the tests to be done today  You will be contacted by phone if any changes need to be made immediately.  Otherwise, you will receive a letter about your results with an explanation, but please check with MyChart first.  Please return in 6 months, or sooner if needed, with Lab testing done 3-5 days before

## 2016-06-02 NOTE — Progress Notes (Signed)
Subjective:    Patient ID: [name], female    DOB: 04-08-1936, 80 y.o.   MRN: [id number]  HPI  Here to f/u  Has some confusion recently about the tramadol., but taking now, and neck pain improved.  Still has left ankle pain however, has seen podiaty who rec;d surgury when she was 80 yo and she declined. Asks to see sport med, thinks may need inserts  Also  C/o fatigue and sleepiness, sitting around, does not normally take naps during the day, usually active and busy during the day, but did take 2 naps at 2 hrs apiece, unusual for her.  Usually good sleep habits with getting to sleep at 11 pm to often 6am or so. One night last wk was restless and slept poorly, may have had something to do with nap the next day.  Denies worsening depressive symptoms, suicidal ideation, or panic; has occas anxiety, not increased recently.   Less active since the knee surguries overall years ago Wt overall stable, it seems but BP increased last 2 visits.  Does not check at home.  BP elevated at oncology visit jan 2017 as well.  Has hx of CKD  Has good compliance overall with the diovan hct. No worsening LE edema, has some element of vnous insufficiency in the past left > right, no change recent, goes away night, back the next day. Pt denies chest pain, increased sob or doe, wheezing, orthopnea, PND, increased LE swelling, palpitations, dizziness or syncope.   Pt denies polydipsia, polyuria, BP Readings from Last 3 Encounters:  06/02/16 (!) 160/82  03/10/16 (!) 150/80  01/21/16 134/64   Wt Readings from Last 3 Encounters:  06/02/16 157 lb (71.2 kg)  03/10/16 159 lb (72.1 kg)  01/21/16 159 lb 4 oz (72.2 kg)   Lab Results  Component Value Date   CREATININE 1.30 (H) 11/25/2015   Past Medical History:  Diagnosis Date  . Anemia   . Breast cancer of upper-outer quadrant of left female breast ([place]) 11/08/2013   ER/PR+ Her2- Left IDC   . Chronic renal insufficiency   . Chronic rhinitis   . Colon polyp     . DJD (degenerative joint disease)   . Full dentures   . GERD (gastroesophageal reflux disease)   . Hypertension   . Impaired glucose tolerance 07/18/2014  . Memory loss   . Morbid obesity ([place])   . Wears glasses    Past Surgical History:  Procedure Laterality Date  . ABDOMINAL HYSTERECTOMY    . BREAST LUMPECTOMY WITH NEEDLE LOCALIZATION AND AXILLARY SENTINEL LYMPH NODE BX Left 12/04/2013   Procedure: BREAST LUMPECTOMY WITH NEEDLE LOCALIZATION AND AXILLARY SENTINEL LYMPH NODE BX;  Surgeon: Shann Medal, MD;  Location: [facility];  Service: General;  Laterality: Left;  . CATARACT EXTRACTION  2009   rt  . COLONOSCOPY    . KNEE ARTHROSCOPY     both  . TONSILLECTOMY    . TOTAL KNEE ARTHROPLASTY  2002   rt  . TOTAL KNEE ARTHROPLASTY  2003   left  . VESICOVAGINAL FISTULA CLOSURE W/ TAH  1980    reports that she has never smoked. She has never used smokeless tobacco. She reports that she does not drink alcohol or use drugs. family history includes Colon cancer in her maternal aunt and mother; Lung cancer in her brother. Allergies  Allergen Reactions  . Iron     REACTION: hives/whelps/bad constipation   Review of Systems  Constitutional: Negative  for unusual diaphoresis or night sweats HENT: Negative for ear swelling or discharge Eyes: Negative for worsening visual haziness  Respiratory: Negative for choking and stridor.   Gastrointestinal: Negative for distension or worsening eructation Genitourinary: Negative for retention or change in urine volume.  Musculoskeletal: Negative for other MSK pain or swelling Skin: Negative for color change and worsening wound Neurological: Negative for tremors and numbness other than noted  Psychiatric/Behavioral: Negative for decreased concentration or agitation other than above       Objective:   Physical Exam BP (!) 160/82   Pulse 84   Temp 98.2 F (36.8 C) (Oral)   Resp 20   Wt 157 lb (71.2 kg)   SpO2 97%   BMI 26.53  kg/m  VS noted,  Constitutional: Pt appears in no apparent distress HENT: Head: NCAT.  Right Ear: External ear normal.  Left Ear: External ear normal.  Eyes: . Pupils are equal, round, and reactive to light. Conjunctivae and EOM are normal Neck: Normal range of motion. Neck supple.  Cardiovascular: Normal rate and regular rhythm.   Pulmonary/Chest: Effort normal and breath sounds without rales or wheezing.  Abd:  Soft, NT, ND, + BS Neurological: Pt is alert. Not confused , motor grossly intact Skin: Skin is warm. No rash, trace bilat LE edema, left> right Psychiatric: Pt behavior is normal. No agitation.  Left ankle with midl swelling and marked bony deg changes    Assessment & Plan:

## 2016-06-02 NOTE — Progress Notes (Signed)
Pre visit review using our clinic review tool, if applicable. No additional management support is needed unless otherwise documented below in the visit note. 

## 2016-06-02 NOTE — Assessment & Plan Note (Signed)
Uncontrolled, to add amlodipine 5 qd, to f/u any worsening symptoms or concerns

## 2016-06-02 NOTE — Assessment & Plan Note (Signed)
stable overall by history and exam, recent data reviewed with pt, and pt to continue medical treatment as before,  to f/u any worsening symptoms or concerns Lab Results  Component Value Date   HGBA1C 5.6 11/25/2015

## 2016-06-02 NOTE — Assessment & Plan Note (Signed)
stable overall by history and exam, recent data reviewed with pt, and pt to continue medical treatment as before,  to f/u any worsening symptoms or concerns, for f/u lab 

## 2016-06-22 ENCOUNTER — Ambulatory Visit: Payer: Commercial Managed Care - HMO | Admitting: Family Medicine

## 2016-06-30 ENCOUNTER — Telehealth: Payer: Self-pay

## 2016-06-30 NOTE — Telephone Encounter (Signed)
amLODipine (NORVASC) 5 MG  Patient is calling saying her pharmacy told her that this medication does cause swelling. She would like to speak with you about this. Can you please follow up with her. Thank you.

## 2016-06-30 NOTE — Telephone Encounter (Signed)
Pt is already on HCT as part of her BP regimen, and amlodipine 5 mg has only a 2-3% chance of causing swelling, so this is quite rare  I dont recall significant both leg swelling last visit; I would hold further diuretic  If she is talking about swelling near the left ankle, perhaps she should consider an OV with Dr Smith/sport medicine

## 2016-06-30 NOTE — Telephone Encounter (Signed)
Please advise patient was wanting to know if she could have a fluid pill for this

## 2016-07-01 NOTE — Telephone Encounter (Signed)
Please advise patient states that now her legs are swelling more than what they were before even when she is resting and not walking or on her feet for hours at a time.

## 2016-07-01 NOTE — Telephone Encounter (Signed)
Unable to reach patient left message to give us a call back. 

## 2016-07-01 NOTE — Telephone Encounter (Signed)
Please call patient back at [phone]

## 2016-07-01 NOTE — Telephone Encounter (Signed)
Scheduled patient an appt for 9/15 at 9:00 am. Thanks.

## 2016-07-01 NOTE — Telephone Encounter (Signed)
Please consider ROV as this may require medication change

## 2016-07-10 ENCOUNTER — Ambulatory Visit (INDEPENDENT_AMBULATORY_CARE_PROVIDER_SITE_OTHER): Payer: Commercial Managed Care - HMO | Admitting: Internal Medicine

## 2016-07-10 ENCOUNTER — Encounter: Payer: Self-pay | Admitting: Internal Medicine

## 2016-07-10 VITALS — BP 140/80 | HR 90 | Temp 98.6°F | Resp 20 | Wt 159.0 lb

## 2016-07-10 DIAGNOSIS — R609 Edema, unspecified: Secondary | ICD-10-CM | POA: Diagnosis not present

## 2016-07-10 DIAGNOSIS — N183 Chronic kidney disease, stage 3 unspecified: Secondary | ICD-10-CM

## 2016-07-10 DIAGNOSIS — Z23 Encounter for immunization: Secondary | ICD-10-CM

## 2016-07-10 DIAGNOSIS — R600 Localized edema: Secondary | ICD-10-CM | POA: Insufficient documentation

## 2016-07-10 DIAGNOSIS — I10 Essential (primary) hypertension: Secondary | ICD-10-CM

## 2016-07-10 DIAGNOSIS — I872 Venous insufficiency (chronic) (peripheral): Secondary | ICD-10-CM | POA: Insufficient documentation

## 2016-07-10 DIAGNOSIS — I5189 Other ill-defined heart diseases: Secondary | ICD-10-CM

## 2016-07-10 HISTORY — DX: Other ill-defined heart diseases: I51.89

## 2016-07-10 NOTE — Assessment & Plan Note (Signed)
Primary issue appears to be chronic venous insufficiency related, though could be some elements of CKD related, diastolic dysfunction, bilat knee replacements, and even mild anemia related; doubt norvasc related, to cont all same meds, and work on exercise, low salt diet, wt control, leg elevation with sitting, avoid prolonged sitting or standing.  Also advised compression stockings but declines, will get support hose due to cost

## 2016-07-10 NOTE — Progress Notes (Signed)
Subjective:    Patient ID: [name], female    DOB: 1936-05-16, 80 y.o.   MRN: [id number]  HPI    Here to f/u; overall doing ok,  Pt denies chest pain, increasing sob or doe, wheezing, orthopnea, PND, palpitations, dizziness or syncope. Has persistent mild LE edema but now worse.  Better in the am, worse by evening after being up all day.  Pt denies new neurological symptoms such as new headache, or facial or extremity weakness or numbness.  Pt denies polydipsia, polyuria, or low sugar episode.   Pt denies new neurological symptoms such as new headache, or facial or extremity weakness or numbness.   Pt states overall good compliance with meds, mostly trying to follow appropriate diet, with wt overall stable Wt Readings from Last 3 Encounters:  07/10/16 159 lb (72.1 kg)  06/02/16 157 lb (71.2 kg)  03/10/16 159 lb (72.1 kg)  Also with ongoing leg cramps worse at night recently Past Medical History:  Diagnosis Date  . Anemia   . Breast cancer of upper-outer quadrant of left female breast ([place]) 11/08/2013   ER/PR+ Her2- Left IDC   . Chronic renal insufficiency   . Chronic rhinitis   . Colon polyp   . Diastolic dysfunction 1/63/8466  . DJD (degenerative joint disease)   . Full dentures   . GERD (gastroesophageal reflux disease)   . Hypertension   . Impaired glucose tolerance 07/18/2014  . Memory loss   . Morbid obesity ([place])   . Wears glasses    Past Surgical History:  Procedure Laterality Date  . ABDOMINAL HYSTERECTOMY    . BREAST LUMPECTOMY WITH NEEDLE LOCALIZATION AND AXILLARY SENTINEL LYMPH NODE BX Left 12/04/2013   Procedure: BREAST LUMPECTOMY WITH NEEDLE LOCALIZATION AND AXILLARY SENTINEL LYMPH NODE BX;  Surgeon: Shann Medal, MD;  Location: [facility];  Service: General;  Laterality: Left;  . CATARACT EXTRACTION  2009   rt  . COLONOSCOPY    . KNEE ARTHROSCOPY     both  . TONSILLECTOMY    . TOTAL KNEE ARTHROPLASTY  2002   rt  . TOTAL KNEE  ARTHROPLASTY  2003   left  . VESICOVAGINAL FISTULA CLOSURE W/ TAH  1980    reports that she has never smoked. She has never used smokeless tobacco. She reports that she does not drink alcohol or use drugs. family history includes Colon cancer in her maternal aunt and mother; Lung cancer in her brother. Allergies  Allergen Reactions  . Iron     REACTION: hives/whelps/bad constipation   Current Outpatient Prescriptions on File Prior to Visit  Medication Sig Dispense Refill  . amLODipine (NORVASC) 5 MG tablet Take 1 tablet (5 mg total) by mouth daily. 90 tablet 3  . aspirin 81 MG tablet Take 81 mg by mouth daily.      [name] atropine 1 % ophthalmic solution Reported on 01/21/2016    . Cholecalciferol (VITAMIN D3) 2000 UNITS TABS Take 1 tablet by mouth daily.     . famotidine (PEPCID) 20 MG tablet Take 20 mg by mouth at bedtime.    . fexofenadine (ALLEGRA) 180 MG tablet Take 180 mg by mouth daily as needed.      [name] glucosamine-chondroitin 500-400 MG tablet Take 1 tablet by mouth daily.     . Multiple Vitamins-Minerals (CENTRUM SILVER PO) Take 1 tablet by mouth daily.      [name] omeprazole (PRILOSEC) 20 MG capsule Take 20 mg by mouth daily.    [name]  Potassium Gluconate 550 (90 K) MG TABS Take 1 tablet by mouth daily.    . Simethicone (GAS-X PO) Take by mouth. Take as directed     . tamoxifen (NOLVADEX) 20 MG tablet TAKE 1 TABLET DAILY. 90 tablet 3  . traMADol (ULTRAM) 50 MG tablet TAKE 1 TABLET EVERY 6 HOURS AS NEEDED 120 tablet 2  . valsartan-hydrochlorothiazide (DIOVAN HCT) 320-25 MG tablet Take 1 tablet by mouth daily. 90 tablet 2   No current facility-administered medications on file prior to visit.     Review of Systems  Constitutional: Negative for unusual diaphoresis or night sweats HENT: Negative for ear swelling or discharge Eyes: Negative for worsening visual haziness  Respiratory: Negative for choking and stridor.   Gastrointestinal: Negative for distension or worsening  eructation Genitourinary: Negative for retention or change in urine volume.  Musculoskeletal: Negative for other MSK pain or swelling Skin: Negative for color change and worsening wound Neurological: Negative for tremors and numbness other than noted  Psychiatric/Behavioral: Negative for decreased concentration or agitation other than above       Objective:   Physical Exam BP 140/80   Pulse 90   Temp 98.6 F (37 C) (Oral)   Resp 20   Wt 159 lb (72.1 kg)   SpO2 92%   BMI 26.87 kg/m  VS noted,  Constitutional: Pt appears in no apparent distress HENT: Head: NCAT.  Right Ear: External ear normal.  Left Ear: External ear normal.  Eyes: . Pupils are equal, round, and reactive to light. Conjunctivae and EOM are normal Neck: Normal range of motion. Neck supple.  Cardiovascular: Normal rate and regular rhythm.   Pulmonary/Chest: Effort normal and breath sounds without rales or wheezing.  Abd:  Soft, NT, ND, + BS Neurological: Pt is alert. Not confused , motor grossly intact Skin: Skin is warm. No rash, trace to 1+ left > right LE ankle edema Psychiatric: Pt behavior is normal. No agitation.   Left > right trace peripheral edema (left to mid leg, right to ankle only) with numerous varicosities  Lab Results  Component Value Date   WBC 8.5 11/25/2015   HGB 10.5 (L) 11/25/2015   HCT 32.3 (L) 11/25/2015   PLT 323.0 11/25/2015   GLUCOSE 72 06/02/2016   CHOL 152 06/02/2016   TRIG 117.0 06/02/2016   HDL 52.50 06/02/2016   LDLCALC 76 06/02/2016   ALT 16 06/02/2016   AST 22 06/02/2016   NA 136 06/02/2016   K 4.2 06/02/2016   CL 100 06/02/2016   CREATININE 1.40 (H) 06/02/2016   BUN 23 06/02/2016   CO2 28 06/02/2016   TSH 1.15 11/25/2015   HGBA1C 5.4 06/02/2016  Transthoracic Echocardiography Study Conclusions 2015-03-27  - Left ventricle: The cavity size was normal. Systolic function was   normal. The estimated ejection fraction was in the range of 55%   to 60%. Wall motion  was normal; there were no regional wall   motion abnormalities. Doppler parameters are consistent with   abnormal left ventricular relaxation (grade 1 diastolic   dysfunction). - Left atrium: The atrium was mildly dilated. - Tricuspid valve: There was mild-moderate regurgitation directed   centrally.    Assessment & Plan:

## 2016-07-10 NOTE — Patient Instructions (Signed)
Please continue all other medications as before, and refills have been done if requested.  Please have the pharmacy call with any other refills you may need.  Please continue your efforts at being more active, low cholesterol diet, and weight control.  You are otherwise up to date with prevention measures today.  Please keep your appointments with your specialists as you may have planned  Please wear the support hose, keep your legs elevated when you are sitting, keeping control of your weight, low salt diet, as well continuing all of your medications, and avoid sitting for more than 1-2 hours without some walking can help with the swelling

## 2016-07-10 NOTE — Progress Notes (Signed)
Pre visit review using our clinic review tool, if applicable. No additional management support is needed unless otherwise documented below in the visit note. 

## 2016-07-11 NOTE — Assessment & Plan Note (Signed)
stable overall by history and exam, recent data reviewed with pt, and pt to continue medical treatment as before,  to f/u any worsening symptoms or concerns Lab Results  Component Value Date   CREATININE 1.40 (H) 06/02/2016

## 2016-07-11 NOTE — Assessment & Plan Note (Signed)
stable overall by history and exam, recent data reviewed with pt, and pt to continue medical treatment as before,  to f/u any worsening symptoms or concerns BP Readings from Last 3 Encounters:  07/10/16 140/80  06/02/16 (!) 160/82  03/10/16 (!) 150/80

## 2016-07-23 ENCOUNTER — Other Ambulatory Visit: Payer: Self-pay | Admitting: *Deleted

## 2016-07-23 DIAGNOSIS — C50412 Malignant neoplasm of upper-outer quadrant of left female breast: Secondary | ICD-10-CM

## 2016-07-27 ENCOUNTER — Ambulatory Visit: Payer: Commercial Managed Care - HMO | Admitting: Nurse Practitioner

## 2016-07-27 ENCOUNTER — Other Ambulatory Visit (HOSPITAL_BASED_OUTPATIENT_CLINIC_OR_DEPARTMENT_OTHER): Payer: Commercial Managed Care - HMO

## 2016-07-27 DIAGNOSIS — C50412 Malignant neoplasm of upper-outer quadrant of left female breast: Secondary | ICD-10-CM

## 2016-07-27 LAB — COMPREHENSIVE METABOLIC PANEL
ALT: 18 U/L (ref 0–55)
AST: 23 U/L (ref 5–34)
Albumin: 3.4 g/dL — ABNORMAL LOW (ref 3.5–5.0)
Alkaline Phosphatase: 50 U/L (ref 40–150)
Anion Gap: 8 mEq/L (ref 3–11)
BUN: 24.7 mg/dL (ref 7.0–26.0)
CO2: 25 mEq/L (ref 22–29)
Calcium: 9.1 mg/dL (ref 8.4–10.4)
Chloride: 105 mEq/L (ref 98–109)
Creatinine: 1.4 mg/dL — ABNORMAL HIGH (ref 0.6–1.1)
EGFR: 41 mL/min/{1.73_m2} — ABNORMAL LOW (ref >90)
Glucose: 136 mg/dl (ref 70–140)
Potassium: 4.4 mEq/L (ref 3.5–5.1)
Sodium: 138 mEq/L (ref 136–145)
Total Bilirubin: 0.32 mg/dL (ref 0.20–1.20)
Total Protein: 6.9 g/dL (ref 6.4–8.3)

## 2016-07-27 LAB — CBC WITH DIFFERENTIAL/PLATELET
BASO%: 0.8 % (ref 0.0–2.0)
Basophils Absolute: 0.1 10*3/uL (ref 0.0–0.1)
EOS%: 3.4 % (ref 0.0–7.0)
Eosinophils Absolute: 0.2 10*3/uL (ref 0.0–0.5)
HCT: 31.3 % — ABNORMAL LOW (ref 34.8–46.6)
HGB: 10.2 g/dL — ABNORMAL LOW (ref 11.6–15.9)
LYMPH%: 21.9 % (ref 14.0–49.7)
MCH: 31.3 pg (ref 25.1–34.0)
MCHC: 32.6 g/dL (ref 31.5–36.0)
MCV: 96 fL (ref 79.5–101.0)
MONO#: 0.4 10*3/uL (ref 0.1–0.9)
MONO%: 5.9 % (ref 0.0–14.0)
NEUT#: 4.3 10*3/uL (ref 1.5–6.5)
NEUT%: 68 % (ref 38.4–76.8)
Platelets: 200 10*3/uL (ref 145–400)
RBC: 3.26 10*6/uL — ABNORMAL LOW (ref 3.70–5.45)
RDW: 12.6 % (ref 11.2–14.5)
WBC: 6.3 10*3/uL (ref 3.9–10.3)
lymph#: 1.4 10*3/uL (ref 0.9–3.3)

## 2016-07-31 ENCOUNTER — Ambulatory Visit (INDEPENDENT_AMBULATORY_CARE_PROVIDER_SITE_OTHER): Payer: Commercial Managed Care - HMO | Admitting: Internal Medicine

## 2016-07-31 VITALS — BP 128/80 | HR 75 | Temp 98.7°F | Resp 20 | Wt 159.0 lb

## 2016-07-31 DIAGNOSIS — R159 Full incontinence of feces: Secondary | ICD-10-CM | POA: Diagnosis not present

## 2016-07-31 DIAGNOSIS — K921 Melena: Secondary | ICD-10-CM | POA: Insufficient documentation

## 2016-07-31 DIAGNOSIS — K649 Unspecified hemorrhoids: Secondary | ICD-10-CM | POA: Insufficient documentation

## 2016-07-31 NOTE — Assessment & Plan Note (Signed)
Spots twice this wk, doubt significant GI bleed, o/w no pain, also for GI consideration, ? For f/u colonsocopy

## 2016-07-31 NOTE — Assessment & Plan Note (Signed)
Suspect related to hemorrhoid vs other, also for Gi consideration,  to f/u any worsening symptoms or concerns

## 2016-07-31 NOTE — Progress Notes (Signed)
Subjective:    Patient ID: [name], female    DOB: 09-01-1936, 80 y.o.   MRN: [id number]  HPI  Here with LBP but states she is really here for hemorrhoids which Dr Rolena Infante mentioned might contribute to LBP in mar 2016.  Has had more discomfort and hard to talk with female physician today, worse recently, and mentions several recent bowel accidents with soiling the clothes.  Denies worsening reflux, abd pain, dysphagia, n/v, bowel change or blood, except for 2 episodes small volume painless BRBPR on underwear only.   Pt denies chest pain, increased sob or doe, wheezing, orthopnea, PND, increased LE swelling, palpitations, dizziness or syncope.   Edema resolved from last visit.  Pt continues to have recurring LBP without change in severity, bowel or bladder change, fever, wt loss,  worsening LE pain/numbness/weakness, gait change or falls, except for occasional right sciatic type pain.  Has declined f/u colonoscopy at 7 yrs after last done 2011 with diverticulosis, but hx of prior colon polyp adenoma Past Medical History:  Diagnosis Date  . Anemia   . Breast cancer of upper-outer quadrant of left female breast ([place]) 11/08/2013   ER/PR+ Her2- Left IDC   . Chronic renal insufficiency   . Chronic rhinitis   . Colon polyp   . Diastolic dysfunction 6/94/8546  . DJD (degenerative joint disease)   . Full dentures   . GERD (gastroesophageal reflux disease)   . Hypertension   . Impaired glucose tolerance 07/18/2014  . Memory loss   . Morbid obesity ([place])   . Wears glasses    Past Surgical History:  Procedure Laterality Date  . ABDOMINAL HYSTERECTOMY    . BREAST LUMPECTOMY WITH NEEDLE LOCALIZATION AND AXILLARY SENTINEL LYMPH NODE BX Left 12/04/2013   Procedure: BREAST LUMPECTOMY WITH NEEDLE LOCALIZATION AND AXILLARY SENTINEL LYMPH NODE BX;  Surgeon: Shann Medal, MD;  Location: [facility];  Service: General;  Laterality: Left;  . CATARACT EXTRACTION  2009   rt  . COLONOSCOPY     . KNEE ARTHROSCOPY     both  . TONSILLECTOMY    . TOTAL KNEE ARTHROPLASTY  2002   rt  . TOTAL KNEE ARTHROPLASTY  2003   left  . VESICOVAGINAL FISTULA CLOSURE W/ TAH  1980    reports that she has never smoked. She has never used smokeless tobacco. She reports that she does not drink alcohol or use drugs. family history includes Colon cancer in her maternal aunt and mother; Lung cancer in her brother. Allergies  Allergen Reactions  . Iron     REACTION: hives/whelps/bad constipation   Current Outpatient Prescriptions on File Prior to Visit  Medication Sig Dispense Refill  . amLODipine (NORVASC) 5 MG tablet Take 1 tablet (5 mg total) by mouth daily. 90 tablet 3  . aspirin 81 MG tablet Take 81 mg by mouth daily.      [name] atropine 1 % ophthalmic solution Reported on 01/21/2016    . Cholecalciferol (VITAMIN D3) 2000 UNITS TABS Take 1 tablet by mouth daily.     . famotidine (PEPCID) 20 MG tablet Take 20 mg by mouth at bedtime.    . fexofenadine (ALLEGRA) 180 MG tablet Take 180 mg by mouth daily as needed.      [name] glucosamine-chondroitin 500-400 MG tablet Take 1 tablet by mouth daily.     . Multiple Vitamins-Minerals (CENTRUM SILVER PO) Take 1 tablet by mouth daily.      [name] omeprazole (PRILOSEC) 20 MG  capsule Take 20 mg by mouth daily.    . Potassium Gluconate 550 (90 K) MG TABS Take 1 tablet by mouth daily.    . Simethicone (GAS-X PO) Take by mouth. Take as directed     . tamoxifen (NOLVADEX) 20 MG tablet TAKE 1 TABLET DAILY. 90 tablet 3  . traMADol (ULTRAM) 50 MG tablet TAKE 1 TABLET EVERY 6 HOURS AS NEEDED 120 tablet 2  . valsartan-hydrochlorothiazide (DIOVAN HCT) 320-25 MG tablet Take 1 tablet by mouth daily. 90 tablet 2   No current facility-administered medications on file prior to visit.     Review of Systems  Constitutional: Negative for unusual diaphoresis or night sweats HENT: Negative for ear swelling or discharge Eyes: Negative for worsening visual haziness  Respiratory:  Negative for choking and stridor.   Gastrointestinal: Negative for distension or worsening eructation Genitourinary: Negative for retention or change in urine volume.  Musculoskeletal: Negative for other MSK pain or swelling Skin: Negative for color change and worsening wound Neurological: Negative for tremors and numbness other than noted  Psychiatric/Behavioral: Negative for decreased concentration or agitation other than above       Objective:   Physical Exam BP 128/80   Pulse 75   Temp 98.7 F (37.1 C) (Oral)   Resp 20   Wt 159 lb (72.1 kg)   SpO2 93%   BMI 26.87 kg/m  VS noted,  Constitutional: Pt appears in no apparent distress HENT: Head: NCAT.  Right Ear: External ear normal.  Left Ear: External ear normal.  Eyes: . Pupils are equal, round, and reactive to light. Conjunctivae and EOM are normal Neck: Normal range of motion. Neck supple.  Cardiovascular: Normal rate and regular rhythm.   Pulmonary/Chest: Effort normal and breath sounds without rales or wheezing.  Abd:  Soft, NT, ND, + BS Neurological: Pt is alert. Not confused , motor grossly intact Skin: Skin is warm. No rash, no LE edema Psychiatric: Pt behavior is normal. No agitation.  Declines DRE today    Assessment & Plan:

## 2016-07-31 NOTE — Assessment & Plan Note (Signed)
Pt vague and hesitating on details, very private person, but ok for GI referral

## 2016-07-31 NOTE — Progress Notes (Signed)
Pre visit review using our clinic review tool, if applicable. No additional management support is needed unless otherwise documented below in the visit note. 

## 2016-07-31 NOTE — Patient Instructions (Addendum)
Please continue all other medications as before, and refills have been done if requested.  Please have the pharmacy call with any other refills you may need.  Please continue your efforts at being more active, low cholesterol diet, and weight control.  Please keep your appointments with your specialists as you may have planned  You will be contacted regarding the referral for: Gastroenterology

## 2016-08-03 ENCOUNTER — Ambulatory Visit: Payer: Commercial Managed Care - HMO | Admitting: Nurse Practitioner

## 2016-08-03 ENCOUNTER — Other Ambulatory Visit: Payer: Self-pay | Admitting: Oncology

## 2016-08-12 ENCOUNTER — Encounter: Payer: Self-pay | Admitting: Internal Medicine

## 2016-09-22 ENCOUNTER — Ambulatory Visit (HOSPITAL_BASED_OUTPATIENT_CLINIC_OR_DEPARTMENT_OTHER): Payer: Commercial Managed Care - HMO | Admitting: Oncology

## 2016-09-22 ENCOUNTER — Encounter: Payer: Self-pay | Admitting: Oncology

## 2016-09-22 ENCOUNTER — Telehealth: Payer: Self-pay | Admitting: *Deleted

## 2016-09-22 ENCOUNTER — Ambulatory Visit (HOSPITAL_COMMUNITY)
Admission: RE | Admit: 2016-09-22 | Discharge: 2016-09-22 | Disposition: A | Discharge status: Home / Self Care | Payer: Commercial Managed Care - HMO | Source: Ambulatory Visit | Attending: Oncology | Admitting: Oncology

## 2016-09-22 VITALS — BP 169/79 | HR 67 | Temp 97.4°F | Resp 18 | Ht 64.5 in | Wt 158.2 lb

## 2016-09-22 DIAGNOSIS — C50412 Malignant neoplasm of upper-outer quadrant of left female breast: Secondary | ICD-10-CM

## 2016-09-22 DIAGNOSIS — Z79810 Long term (current) use of selective estrogen receptor modulators (SERMs): Secondary | ICD-10-CM

## 2016-09-22 DIAGNOSIS — Z170 Estrogen receptor positive status [ER+]: Secondary | ICD-10-CM

## 2016-09-22 NOTE — Telephone Encounter (Signed)
"  I was there today with a black pair of wrap around shades and left without them.  I also have no more refills on tamoxifen through [facility]."  Located shades which will be left up front at registration desk.  Asked that she call [employer] January 10th  For refill which they will contact [facility] for a new prescription.  Denies further needs or questions.

## 2016-09-22 NOTE — Progress Notes (Signed)
[facility]  Telephone:([phone]) [phone] Fax:(336) [phone]     ID: [name] OB: 08-01-1936  MR#: [id number]  MGQ#:[id number]  PCP: Felicia Cower, MD GYN:  Felicia Nigh, MD SU: Felicia Overall, MD OTHER MD: Felicia Silversmith, MD; Felicia Gully, MD; Felicia Shorts, MD  CHIEF COMPLAINT:  Hx of Left Breast Cancer CURRENT TREATMENT: tamoxifen  BREAST CANCER HISTORY: From the original intake note:  The patient underwent routine screening mammography at [facility] 10/09/2013 showing a 1 cm cluster of calcifications in the right breast at 5:00. There was also a 9 mm irregular density in the left breast laterally. Bilateral diagnostic mammography with tomography at [facility] 11/01/2013 confirmed a 1 cm cluster of vascular calcifications at the 5:00 position of the right breast, which were felt to be stable. However in the left breast there was a new 1.1 cm mass with spiculated margins. This was at 1:00, 8 cm from the nipple. Ultrasound of the left breast the same day showed the mass to measure 6 mm and to be hypoechoic. The left axilla was unremarkable.  Biopsy of the left breast mass in question 11/06/2013 showed ([id number]) and invasive ductal carcinoma, grade 2, estrogen receptor 100% positive, progesterone receptor 95% positive, both with strong staining intensity, with an MIB-1 of 19% and no HER-2 amplification, the signals ratio being 0.95 and the number per cell 2.00. E-cadherin was positive.  The patient's subsequent history is as detailed below  INTERVAL HISTORY: [name] returns today for follow up of her breast cancer. She continues on tamoxifen, with good tolerance. In the last 2 or 3 weeks she has actually experienced a few hot flashes. These are very occasional and do not wake her up. She has had no vaginal discharge from this medication. She obtains it currently of no cost  REVIEW OF SYSTEMS: [name] has had a cough on and off for the last 2-3 months she says. There has been  no fever, no pleurisy, and no purulence. She has not felt short of breath. Nevertheless this has persisted. She has both on Pepcid and Prilosec for reflux. She has a little bit of back pain and other aches and pains here and there which are not more intense or persistent than before. She has problems with her hearing. Her dentures fit poorly. Sometimes her ankles swell. She can have a little bit of urinary incontinence at times. A detailed review of systems today was otherwise stable  PAST MEDICAL HISTORY: Past Medical History:  Diagnosis Date  . Anemia   . Breast cancer of upper-outer quadrant of left female breast ([place]) 11/08/2013   ER/PR+ Her2- Left IDC   . Chronic renal insufficiency   . Chronic rhinitis   . Colon polyp   . Diastolic dysfunction 2/67/1245  . DJD (degenerative joint disease)   . Full dentures   . GERD (gastroesophageal reflux disease)   . Hypertension   . Impaired glucose tolerance 07/18/2014  . Memory loss   . Morbid obesity ([place])   . Wears glasses     PAST SURGICAL HISTORY: Past Surgical History:  Procedure Laterality Date  . ABDOMINAL HYSTERECTOMY    . BREAST LUMPECTOMY WITH NEEDLE LOCALIZATION AND AXILLARY SENTINEL LYMPH NODE BX Left 12/04/2013   Procedure: BREAST LUMPECTOMY WITH NEEDLE LOCALIZATION AND AXILLARY SENTINEL LYMPH NODE BX;  Surgeon: Shann Medal, MD;  Location: [facility];  Service: General;  Laterality: Left;  . CATARACT EXTRACTION  2009   rt  . COLONOSCOPY    .  KNEE ARTHROSCOPY     both  . TONSILLECTOMY    . TOTAL KNEE ARTHROPLASTY  2002   rt  . TOTAL KNEE ARTHROPLASTY  2003   left  . VESICOVAGINAL FISTULA CLOSURE W/ TAH  1980   status post hysterectomy with bilateral sloping oophorectomy  FAMILY HISTORY Family History  Problem Relation Age of Onset  . Heart disease      remote family  . Lung cancer Brother     was a smoker  . Colon cancer Mother   . Colon cancer Maternal Aunt    the patient's mother died at the age  of 89, with a history of colon cancer. The patient's father died at the age of 50 with "stomach cancer". The patient had 5 brothers, 3 sisters. One brother had lung cancer diagnosed at age 77. One maternal aunt had colon cancer at age 15. There is no history of breast or ovarian cancer in the family to her knowledge.  GYNECOLOGIC HISTORY:   (Reviewed 03/28/2014) Menarche age 31, first live birth age 48, the patient is [name] P2. She underwent hysterectomy with bilateral salpingo-oophorectomy approximately 34 years ago. She did not use hormone replacement. She did use birth control remotely, for about 15 years, with no complications.  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 She is widowed, lives alone with no pets. Son [name] died from a myocardial infarction at the age of 36. Daughter [name] works in [place] as an Microbiologist (at [employer]). The patient has 2 grandchildren and one great-grandchild. She is a Psychologist, forensic.    ADVANCED DIRECTIVES: In place; the patient's daughter [name] is her healthcare power of attorney. [name] can be reached at [phone] (cell), and [phone] (work).   HEALTH MAINTENANCE:  (Updated 03/28/2014) Social History  Substance Use Topics  . Smoking status: Never Smoker  . Smokeless tobacco: Never Used  . Alcohol use No    Colonoscopy:  Oct 2011/DrHenrene Pastor  PAP: s/p hysterectomy   Bone density: 03/15/2013 at [facility] in [place], shows normal bone density  Lipid panel:  April 2015/Dr. John   Allergies  Allergen Reactions  . Iron     REACTION: hives/whelps/bad constipation    Current Outpatient Prescriptions  Medication Sig Dispense Refill  . amLODipine (NORVASC) 5 MG tablet Take 1 tablet (5 mg total) by mouth daily. 90 tablet 3  . aspirin 81 MG tablet Take 81 mg by mouth daily.      . Cholecalciferol (VITAMIN D3) 2000 UNITS TABS Take 1  tablet by mouth daily.     . famotidine (PEPCID) 20 MG tablet Take 20 mg by mouth at bedtime.    . fexofenadine (ALLEGRA) 180 MG tablet Take 180 mg by mouth daily as needed.      [name] glucosamine-chondroitin 500-400 MG tablet Take 1 tablet by mouth daily.     . Multiple Vitamins-Minerals (CENTRUM SILVER PO) Take 1 tablet by mouth daily.      [name] omeprazole (PRILOSEC) 20 MG capsule Take 20 mg by mouth daily.    . Potassium Gluconate 550 (90 K) MG TABS Take 1 tablet by mouth daily.    . Simethicone (GAS-X PO) Take by mouth. Take as directed     . tamoxifen (NOLVADEX) 20 MG tablet TAKE 1 TABLET DAILY. 90 tablet 3  . traMADol (ULTRAM) 50 MG tablet TAKE 1 TABLET EVERY 6 HOURS AS NEEDED 120 tablet  2  . valsartan-hydrochlorothiazide (DIOVAN HCT) 320-25 MG tablet Take 1 tablet by mouth daily. 90 tablet 2   No current facility-administered medications for this visit.     OBJECTIVE: Older African American woman In no acute distress  Vitals:   09/22/16 1041  BP: (!) 169/79  Pulse: 67  Resp: 18  Temp: 97.4 F (36.3 C)     Body mass index is 26.74 kg/m.    ECOG FS: 1 Filed Weights   09/22/16 1041  Weight: 158 lb 3.2 oz (71.8 kg)   Sclerae unicteric, pupils round and equal Oropharynx clear and moist-- no thrush or other lesions No cervical or supraclavicular adenopathy Lungs no rales or rhonchi, no wheezes appreciated Heart regular rate and rhythm Abd soft, nontender, positive bowel sounds MSK no focal spinal tenderness, no upper extremity lymphedema Neuro: nonfocal, well oriented, appropriate affect Breasts: The right breast is status post lumpectomy without evidence of disease recurrence. The cosmetic result is good. The right axilla is benign. The left breast is unremarkable.  LAB RESULTS:   Lab Results  Component Value Date   WBC 6.3 07/27/2016   NEUTROABS 4.3 07/27/2016   HGB 10.2 (L) 07/27/2016   HCT 31.3 (L) 07/27/2016   MCV 96.0 07/27/2016   PLT 200 07/27/2016      Chemistry       Component Value Date/Time   NA 138 07/27/2016 0935   K 4.4 07/27/2016 0935   CL 100 06/02/2016 1038   CO2 25 07/27/2016 0935   BUN 24.7 07/27/2016 0935   CREATININE 1.4 (H) 07/27/2016 0935      Component Value Date/Time   CALCIUM 9.1 07/27/2016 0935   ALKPHOS 50 07/27/2016 0935   AST 23 07/27/2016 0935   ALT 18 07/27/2016 0935   BILITOT 0.32 07/27/2016 0935       STUDIES: Left diagnostic mammography at [facility] for follow-up of microcalcifications was obtained 03/31/2016. Breast density was category B. There were no findings of concern. The patient will resume yearly mammography 10/01/2016.  ASSESSMENT: 80 y.o. [place] woman status post left breast biopsy 11/06/2013 for a clinical T1n N0, stage IA invasive ductal carcinoma, grade 2, estrogen receptor 100% positive, progesterone receptor 95% positive, with an MIB-1 of 19% and no HER-2 amplification  (1) status post left lumpectomy and sentinel lymph node sampling 12/04/2013 for a pT1c pN0, stage IA invasive ductal carcinoma, grade 1, repeat HER-2 again negative, with ample margins  (2) anastrozole Mar 2015-Dec 2015, letrozole Jan 2016-Feb 2016. Started tamoxifen March 2016  (3) opted to forgo radiation therapy   PLAN: Prima is now nearly 3 years out from definitive surgery for her breast cancer with no evidence of disease recurrence. This is very favorable.  She is tolerating tamoxifen well. At this point did not flashes are minimal and she does not want to take any medication for them.  She does have a persistent cough. This does not sound like pneumonia. It would be fairly classic for reflux except she is already on to reflux medicines. I think it would be prudent to obtain a chest x-ray which we are obtaining today.  Otherwise she will have her next mammogram next week. I'm going to see her again a year from now, after her next mammogram in 2018.  She knows to call for any problems that may develop before that  visit.     Chauncey Cruel, MD   09/22/2016 11:12 AM

## 2016-10-01 ENCOUNTER — Encounter: Payer: Self-pay | Admitting: Internal Medicine

## 2016-10-13 ENCOUNTER — Ambulatory Visit: Payer: Commercial Managed Care - HMO | Admitting: Internal Medicine

## 2016-10-28 ENCOUNTER — Telehealth: Payer: Self-pay | Admitting: *Deleted

## 2016-10-28 MED ORDER — VALSARTAN-HYDROCHLOROTHIAZIDE 320-25 MG PO TABS: 1.0000 | ORAL_TABLET | Freq: Every day | ORAL | 1 refills | Status: DC

## 2016-10-28 NOTE — Telephone Encounter (Signed)
Pt left msg on triage needing rx for Valsartan/hctz sen to [facility]. Sent electronically...Johny Chess

## 2016-10-30 ENCOUNTER — Ambulatory Visit (INDEPENDENT_AMBULATORY_CARE_PROVIDER_SITE_OTHER): Payer: Medicare PPO | Admitting: Internal Medicine

## 2016-10-30 ENCOUNTER — Encounter: Payer: Self-pay | Admitting: Internal Medicine

## 2016-10-30 VITALS — BP 180/90 | HR 80 | Resp 20 | Wt 147.0 lb

## 2016-10-30 DIAGNOSIS — K219 Gastro-esophageal reflux disease without esophagitis: Secondary | ICD-10-CM | POA: Diagnosis not present

## 2016-10-30 DIAGNOSIS — I10 Essential (primary) hypertension: Secondary | ICD-10-CM

## 2016-10-30 DIAGNOSIS — R42 Dizziness and giddiness: Secondary | ICD-10-CM | POA: Diagnosis not present

## 2016-10-30 DIAGNOSIS — J309 Allergic rhinitis, unspecified: Secondary | ICD-10-CM

## 2016-10-30 HISTORY — DX: Allergic rhinitis, unspecified: J30.9

## 2016-10-30 MED ORDER — AMLODIPINE BESYLATE 10 MG PO TABS: 10.0000 mg | ORAL_TABLET | Freq: Every day | ORAL | 3 refills | Status: DC

## 2016-10-30 MED ORDER — MECLIZINE HCL 12.5 MG PO TABS: 12.5000 mg | ORAL_TABLET | Freq: Three times a day (TID) | ORAL | 1 refills | Status: DC | PRN

## 2016-10-30 MED ORDER — TRIAMCINOLONE ACETONIDE 55 MCG/ACT NA AERO: 2.0000 | INHALATION_SPRAY | Freq: Every day | NASAL | 12 refills | Status: DC

## 2016-10-30 MED ORDER — PANTOPRAZOLE SODIUM 40 MG PO TBEC: 40.0000 mg | DELAYED_RELEASE_TABLET | Freq: Every day | ORAL | 3 refills | Status: DC

## 2016-10-30 NOTE — Progress Notes (Signed)
Subjective:    Patient ID: [name], female    DOB: December 07, 1935, 81 y.o.   MRN: [id number]  HPI  Here with daughter, c/o new onset positional veritgo like symptoms with dizziness for several seconds only after sits up, lies down or turns over, overall mild, intermittent, nothing else makes better or worse. No ST, HA, sinus congestion, fever, earache, hearing loss or tinnitus.  Pt denies chest pain, increased sob or doe, wheezing, orthopnea, PND, increased LE swelling, palpitations, dizziness or syncope.   BP noted quite elevated today. Pt denies new neurological symptoms such as new headache, or facial or extremity weakness or numbness   Pt denies polydipsia, polyuria,  Does have several wks ongoing nasal allergy symptoms with mild itch and sneezing and slight post nasal gtt,, without fever, pain, ST, cough, swelling or wheezing.  Has recurrent non prod cough,  BP Readings from Last 3 Encounters:  10/30/16 (!) 180/90  09/22/16 (!) 169/79  07/31/16 128/80   Past Medical History:  Diagnosis Date  . Allergic rhinitis 10/30/2016  . Anemia   . Breast cancer of upper-outer quadrant of left female breast ([place]) 11/08/2013   ER/PR+ Her2- Left IDC   . Chronic renal insufficiency   . Chronic rhinitis   . Colon polyp   . Diastolic dysfunction 2/90/2111  . DJD (degenerative joint disease)   . Full dentures   . GERD (gastroesophageal reflux disease)   . Hypertension   . Impaired glucose tolerance 07/18/2014  . Memory loss   . Morbid obesity ([place])   . Wears glasses    Past Surgical History:  Procedure Laterality Date  . ABDOMINAL HYSTERECTOMY    . BREAST LUMPECTOMY WITH NEEDLE LOCALIZATION AND AXILLARY SENTINEL LYMPH NODE BX Left 12/04/2013   Procedure: BREAST LUMPECTOMY WITH NEEDLE LOCALIZATION AND AXILLARY SENTINEL LYMPH NODE BX;  Surgeon: Shann Medal, MD;  Location: [facility];  Service: General;  Laterality: Left;  . CATARACT EXTRACTION  2009   rt  . COLONOSCOPY    .  KNEE ARTHROSCOPY     both  . TONSILLECTOMY    . TOTAL KNEE ARTHROPLASTY  2002   rt  . TOTAL KNEE ARTHROPLASTY  2003   left  . VESICOVAGINAL FISTULA CLOSURE W/ TAH  1980    reports that she has never smoked. She has never used smokeless tobacco. She reports that she does not drink alcohol or use drugs. family history includes Colon cancer in her maternal aunt and mother; Lung cancer in her brother. Allergies  Allergen Reactions  . Iron     REACTION: hives/whelps/bad constipation   Current Outpatient Prescriptions on File Prior to Visit  Medication Sig Dispense Refill  . aspirin 81 MG tablet Take 81 mg by mouth daily.      . Cholecalciferol (VITAMIN D3) 2000 UNITS TABS Take 1 tablet by mouth daily.     . famotidine (PEPCID) 20 MG tablet Take 20 mg by mouth at bedtime.    . fexofenadine (ALLEGRA) 180 MG tablet Take 180 mg by mouth daily as needed.      [name] glucosamine-chondroitin 500-400 MG tablet Take 1 tablet by mouth daily.     . Multiple Vitamins-Minerals (CENTRUM SILVER PO) Take 1 tablet by mouth daily.      . Potassium Gluconate 550 (90 K) MG TABS Take 1 tablet by mouth daily.    . Simethicone (GAS-X PO) Take by mouth. Take as directed     . tamoxifen (NOLVADEX) 20 MG tablet  TAKE 1 TABLET DAILY. 90 tablet 3  . traMADol (ULTRAM) 50 MG tablet TAKE 1 TABLET EVERY 6 HOURS AS NEEDED 120 tablet 2  . valsartan-hydrochlorothiazide (DIOVAN HCT) 320-25 MG tablet Take 1 tablet by mouth daily. 90 tablet 1   No current facility-administered medications on file prior to visit.    Review of Systems  Constitutional: Negative for unusual diaphoresis or night sweats HENT: Negative for ear swelling or discharge Eyes: Negative for worsening visual haziness  Respiratory: Negative for choking and stridor.   Gastrointestinal: Negative for distension or worsening eructation Genitourinary: Negative for retention or change in urine volume.  Musculoskeletal: Negative for other MSK pain or  swelling Skin: Negative for color change and worsening wound Neurological: Negative for tremors and numbness other than noted  Psychiatric/Behavioral: Negative for decreased concentration or agitation other than above   All other system neg per pt    Objective:   Physical Exam BP (!) 180/90 (BP Location: Right Arm, Cuff Size: Small)   Pulse 80   Resp 20   Wt 147 lb (66.7 kg)   SpO2 95%   BMI 24.84 kg/m  VS noted,  Constitutional: Pt appears in no apparent distress HENT: Head: NCAT.  Right Ear: External ear normal.  Left Ear: External ear normal.  Eyes: . Pupils are equal, round, and reactive to light. Conjunctivae and EOM are normal Neck: Normal range of motion. Neck supple.  Cardiovascular: Normal rate and regular rhythm.   Pulmonary/Chest: Effort normal and breath sounds without rales or wheezing.  Abd:  Soft, NT, ND, + BS Neurological: Pt is alert. Not confused , motor 5/5 intact, cn 2-12 intact, gait not wide based Skin: Skin is warm. No rash, no LE edema Psychiatric: Pt behavior is normal. No agitation.  No other new exam findings    Assessment & Plan:

## 2016-10-30 NOTE — Patient Instructions (Addendum)
Please take all new medication as prescribed - the meclizine as needed   Please also practice the Epley Maneuver at home (sometimes called the Hall-Pike maneuver)  Please call if you would want to the Vestibular Rehabilitation referral (a kind of Physical Therapy)  OK to increase the amlodipine to 10 mg per day  Please take all new medication as prescribed  - the nasacort for allergies  OK to stop the prilosec, and Please take all new medication as prescribed - the Protonix 40 mg per day  Please continue all other medications as before, and refills have been done if requested.  Please have the pharmacy call with any other refills you may need.  Please continue your efforts at being more active, low cholesterol diet, and weight control.  You are otherwise up to date with prevention measures today.  Please keep your appointments with your specialists as you may have planned  Please return in 3 months, or sooner if needed, with Lab testing done 3-5 days before

## 2016-10-30 NOTE — Progress Notes (Signed)
Pre visit review using our clinic review tool, if applicable. No additional management support is needed unless otherwise documented below in the visit note. 

## 2016-10-31 NOTE — Assessment & Plan Note (Addendum)
Mild, likely BPPV/peripheral, for meclizine prn,  to f/u any worsening symptoms or concerns, for [facility] maneuver, consider vestibular rehab.

## 2016-10-31 NOTE — Assessment & Plan Note (Signed)
Mild to mod, for add nasacort asd,  to f/u any worsening symptoms or concerns 

## 2016-10-31 NOTE — Assessment & Plan Note (Signed)
Uncontrolled, to increase amlodipine to 10 qd,  to f/u any worsening symptoms or concerns

## 2016-10-31 NOTE — Assessment & Plan Note (Signed)
With persistent recent non prod cough, for change prilosec to protonix 40 qd

## 2016-11-06 ENCOUNTER — Encounter: Payer: Self-pay | Admitting: Internal Medicine

## 2016-11-06 ENCOUNTER — Ambulatory Visit (INDEPENDENT_AMBULATORY_CARE_PROVIDER_SITE_OTHER): Payer: Medicare PPO | Admitting: Internal Medicine

## 2016-11-06 VITALS — BP 144/64 | HR 82 | Ht 64.5 in | Wt 159.0 lb

## 2016-11-06 DIAGNOSIS — R159 Full incontinence of feces: Secondary | ICD-10-CM

## 2016-11-06 DIAGNOSIS — K625 Hemorrhage of anus and rectum: Secondary | ICD-10-CM

## 2016-11-06 DIAGNOSIS — Z86010 Personal history of colonic polyps: Secondary | ICD-10-CM | POA: Diagnosis not present

## 2016-11-06 DIAGNOSIS — R195 Other fecal abnormalities: Secondary | ICD-10-CM

## 2016-11-06 MED ORDER — NA SULFATE-K SULFATE-MG SULF 17.5-3.13-1.6 GM/177ML PO SOLN: 1.0000 | Freq: Once | ORAL | 0 refills | Status: AC

## 2016-11-06 NOTE — Progress Notes (Signed)
HISTORY OF PRESENT ILLNESS:  [name] is a 81 y.o. female with multiple significant medical problems as listed below who is referred by her primary care provider Dr. Jonny Ruiz regarding fecal incontinence. Patient has been seen previously for history of adenomatous colon polyps. Prior colonoscopies in 2003, 2006, and most recently October 2011. The most recent exam revealed moderate pandiverticulosis and internal hemorrhoids. She is accompanied today by her daughter. She reports a two-year history (at least) of intermittent fecal incontinence. This without warning. Over the past several months the frequency has increased. Worse over the past few months as the result of loose bowel movements. No nocturnal component. She does have occasional intermittent red blood per rectum. No associated rectal pain. No abdominal pain. Weight has been stable. She is not using protective undergarments. She describes several loose bowel movements per day. She describes fecal incontinence several times per week. She does take PPI regularly for chronic GERD. No change in diet or relatively new medications. She denies prior history of stroke or back surgery. Review of outside blood work from 2017 shows mild normocytic anemia with hemoglobin 10.2. Normal B12 and iron. Normal electrolytes  REVIEW OF SYSTEMS:  All non-GI ROS negative except for allergies, arthritis, visual change, back pain, cough, itching, hearing problems, muscle cramps, shortness of breath, skin rash, swelling of malaise, urinary frequency, urinary leakage  Past Medical History:  Diagnosis Date  . Allergic rhinitis 10/30/2016  . Anemia   . Breast cancer of upper-outer quadrant of left female breast (HCC) 11/08/2013   ER/PR+ Her2- Left IDC   . Chronic renal insufficiency   . Chronic rhinitis   . Colon polyp   . Diastolic dysfunction 07/10/2016  . DJD (degenerative joint disease)   . Full dentures   . GERD (gastroesophageal reflux disease)   .  Hypertension   . Impaired glucose tolerance 07/18/2014  . Memory loss   . Morbid obesity (HCC)   . Vertigo   . Wears glasses     Past Surgical History:  Procedure Laterality Date  . ABDOMINAL HYSTERECTOMY    . BREAST LUMPECTOMY WITH NEEDLE LOCALIZATION AND AXILLARY SENTINEL LYMPH NODE BX Left 12/04/2013   Procedure: BREAST LUMPECTOMY WITH NEEDLE LOCALIZATION AND AXILLARY SENTINEL LYMPH NODE BX;  Surgeon: Kandis Cocking, MD;  Location: [facility];  Service: General;  Laterality: Left;  . CATARACT EXTRACTION  2009   rt  . COLONOSCOPY    . KNEE ARTHROSCOPY     both  . TONSILLECTOMY    . TOTAL KNEE ARTHROPLASTY  2002   rt  . TOTAL KNEE ARTHROPLASTY  2003   left  . VESICOVAGINAL FISTULA CLOSURE W/ TAH  1980    Social History [name]  reports that she has never smoked. She has never used smokeless tobacco. She reports that she does not drink alcohol or use drugs.  family history includes Colon cancer in her maternal aunt and mother; Heart attack (age of onset: 65) in her son; Lung cancer in her brother.  Allergies  Allergen Reactions  . Iron     REACTION: hives/whelps/bad constipation       PHYSICAL EXAMINATION: Vital signs: BP (!) 144/64   Pulse 82   Ht 5' 4.5" (1.638 m)   Wt 159 lb (72.1 kg)   BMI 26.87 kg/m   Constitutional: generally well-appearing, no acute distress Psychiatric: alert and oriented x3, cooperative Eyes: extraocular movements intact, anicteric, conjunctiva pink Mouth: oral pharynx moist, no lesions Neck: supple no lymphadenopathy  Cardiovascular: heart regular rate and rhythm, no murmur Lungs: clear to auscultation bilaterally Abdomen: soft, nontender, nondistended, no obvious ascites, no peritoneal signs, normal bowel sounds, no organomegaly Rectal: Deferred until colonoscopy Extremities: no clubbing cyanosis or lower extremity edema bilaterally Skin: no lesions on visible extremities Neuro: No focal deficits. Cranial  nerves intact. DTRs present. No asterixis.   ASSESSMENT:  #1. Fecal incontinence. Suspect diminished or absent rectal tone based on description. Exacerbated by loose stools #2. Loose stools of several months duration. Rule out microscopic colitis #3. Personal history of adenomatous colon polyps per last examination 2011 without neoplasia #4. GERD. Stable on PPI #5. Multiple medical problems #6. Rectal bleeding. May be hemorrhoidal based on description. Noted internal hemorrhoids   PLAN:  #1. Advised to wear protective undergarments #2. Told to bulk up stools with Metamucil 2 tablespoons daily #3. Recommend colonoscopy to evaluate change in bowel habits, rectal bleeding, rule out microscopic colitis with biopsies in a patient with a history of adenomatous polyps.The nature of the procedure, as well as the risks, benefits, and alternatives were carefully and thoroughly reviewed with the patient. Ample time for discussion and questions allowed. The patient understood, was satisfied, and agreed to proceed.  A copy of this consultation note has been sent to Dr. Jenny Reichmann

## 2016-11-06 NOTE — Patient Instructions (Signed)
You have been scheduled for a colonoscopy. Please follow written instructions given to you at your visit today.  Please pick up your prep supplies at the pharmacy within the next 1-3 days. If you use inhalers (even only as needed), please bring them with you on the day of your procedure.   

## 2016-11-09 ENCOUNTER — Other Ambulatory Visit: Payer: Self-pay | Admitting: Internal Medicine

## 2016-11-09 ENCOUNTER — Other Ambulatory Visit: Payer: Self-pay | Admitting: Oncology

## 2016-11-10 NOTE — Telephone Encounter (Signed)
faxed

## 2016-11-10 NOTE — Telephone Encounter (Signed)
Done hardcopy to Corinne  

## 2016-12-02 ENCOUNTER — Ambulatory Visit: Payer: Commercial Managed Care - HMO | Admitting: Internal Medicine

## 2016-12-16 ENCOUNTER — Ambulatory Visit: Payer: Commercial Managed Care - HMO | Admitting: Internal Medicine

## 2017-01-06 ENCOUNTER — Encounter: Payer: Self-pay | Admitting: Internal Medicine

## 2017-01-14 ENCOUNTER — Other Ambulatory Visit: Payer: Self-pay | Admitting: *Deleted

## 2017-01-14 MED ORDER — MECLIZINE HCL 12.5 MG PO TABS: 12.5000 mg | ORAL_TABLET | Freq: Three times a day (TID) | ORAL | 0 refills | Status: DC | PRN

## 2017-01-14 NOTE — Telephone Encounter (Signed)
Left msg on triage yesterday afternoon stating she need rx sent to [facility] for her Meclizine. Notified pt rx sent to [facility]...Johny Chess

## 2017-01-18 ENCOUNTER — Telehealth: Payer: Self-pay | Admitting: *Deleted

## 2017-01-18 NOTE — Telephone Encounter (Signed)
Left msg on triage requesting refill on her Tramadol to be sent to [facility]Johny Chess

## 2017-01-19 ENCOUNTER — Encounter: Payer: Self-pay | Admitting: Internal Medicine

## 2017-01-19 ENCOUNTER — Ambulatory Visit (AMBULATORY_SURGERY_CENTER): Payer: Medicare PPO | Admitting: Internal Medicine

## 2017-01-19 VITALS — BP 138/60 | HR 58 | Temp 96.0°F | Resp 16 | Ht 64.0 in | Wt 159.0 lb

## 2017-01-19 DIAGNOSIS — R195 Other fecal abnormalities: Secondary | ICD-10-CM

## 2017-01-19 DIAGNOSIS — Z86010 Personal history of colonic polyps: Secondary | ICD-10-CM

## 2017-01-19 DIAGNOSIS — K625 Hemorrhage of anus and rectum: Secondary | ICD-10-CM | POA: Diagnosis not present

## 2017-01-19 DIAGNOSIS — R159 Full incontinence of feces: Secondary | ICD-10-CM | POA: Diagnosis not present

## 2017-01-19 MED ORDER — TRAMADOL HCL 50 MG PO TABS: 50.0000 mg | ORAL_TABLET | Freq: Four times a day (QID) | ORAL | 2 refills | Status: DC | PRN

## 2017-01-19 MED ORDER — SODIUM CHLORIDE 0.9 % IV SOLN: 500.0000 mL | INTRAVENOUS | Status: DC

## 2017-01-19 NOTE — Patient Instructions (Signed)
Handouts given : Diverticulosis, High Fiber and Hemorrhoids.    YOU HAD AN ENDOSCOPIC PROCEDURE TODAY AT [facility]:   Refer to the procedure report that was given to you for any specific questions about what was found during the examination.  If the procedure report does not answer your questions, please call your gastroenterologist to clarify.  If you requested that your care partner not be given the details of your procedure findings, then the procedure report has been included in a sealed envelope for you to review at your convenience later.  YOU SHOULD EXPECT: Some feelings of bloating in the abdomen. Passage of more gas than usual.  Walking can help get rid of the air that was put into your GI tract during the procedure and reduce the bloating. If you had a lower endoscopy (such as a colonoscopy or flexible sigmoidoscopy) you may notice spotting of blood in your stool or on the toilet paper. If you underwent a bowel prep for your procedure, you may not have a normal bowel movement for a few days.  Please Note:  You might notice some irritation and congestion in your nose or some drainage.  This is from the oxygen used during your procedure.  There is no need for concern and it should clear up in a day or so.  SYMPTOMS TO REPORT IMMEDIATELY:   Following lower endoscopy (colonoscopy or flexible sigmoidoscopy):  Excessive amounts of blood in the stool  Significant tenderness or worsening of abdominal pains  Swelling of the abdomen that is new, acute  Fever of 100F or higher   For urgent or emergent issues, a gastroenterologist can be reached at any hour by calling [phone].   DIET:  We do recommend a small meal at first, but then you may proceed to your regular diet.  Drink plenty of fluids but you should avoid alcoholic beverages for 24 hours.  ACTIVITY:  You should plan to take it easy for the rest of today and you should NOT DRIVE or use heavy machinery until  tomorrow (because of the sedation medicines used during the test).    FOLLOW UP: Our staff will call the number listed on your records the next business day following your procedure to check on you and address any questions or concerns that you may have regarding the information given to you following your procedure. If we do not reach you, we will leave a message.  However, if you are feeling well and you are not experiencing any problems, there is no need to return our call.  We will assume that you have returned to your regular daily activities without incident.  If any biopsies were taken you will be contacted by phone or by letter within the next 1-3 weeks.  Please call us at [phone] if you have not heard about the biopsies in 3 weeks.    SIGNATURES/CONFIDENTIALITY: You and/or your care partner have signed paperwork which will be entered into your electronic medical record.  These signatures attest to the fact that that the information above on your After Visit Summary has been reviewed and is understood.  Full responsibility of the confidentiality of this discharge information lies with you and/or your care-partner.

## 2017-01-19 NOTE — Telephone Encounter (Signed)
Faxed to [employer]

## 2017-01-19 NOTE — Telephone Encounter (Signed)
Done hardcopy to Shir[facility]  

## 2017-01-19 NOTE — Progress Notes (Signed)
To PACU. Report to RN. VSS. 

## 2017-01-19 NOTE — Progress Notes (Signed)
Called to room to assist during endoscopic procedure.  Patient ID and intended procedure confirmed with present staff. Received instructions for my participation in the procedure from the performing physician.  

## 2017-01-19 NOTE — Op Note (Signed)
[facility] Patient Name: [name] Procedure Date: 01/19/2017 11:35 AM MRN: [id number] Endoscopist: Docia Chuck. Henrene Pastor , MD Age: 81 Referring MD:  Date of Birth: 19-Aug-1936 Gender: Female Account #: [id number] Procedure:                Colonoscopy with biopsies Indications:              Clinically significant diarrhea of unexplained                            origin, Rectal bleeding, Fecal incontinence. Remote                            history of adenomatous polyps. Previous                            examinations 2003, 2006, and 2011 Medicines:                Monitored Anesthesia Care Procedure:                Pre-Anesthesia Assessment:                           - Prior to the procedure, a History and Physical                            was performed, and patient medications and                            allergies were reviewed. The patient's tolerance of                            previous anesthesia was also reviewed. The risks                            and benefits of the procedure and the sedation                            options and risks were discussed with the patient.                            All questions were answered, and informed consent                            was obtained. Prior Anticoagulants: The patient has                            taken no previous anticoagulant or antiplatelet                            agents. ASA Grade Assessment: II - A patient with                            mild systemic disease. After reviewing the risks  and benefits, the patient was deemed in                            satisfactory condition to undergo the procedure.                           After obtaining informed consent, the colonoscope                            was passed under direct vision. Throughout the                            procedure, the patient's blood pressure, pulse, and                            oxygen saturations  were monitored continuously. The                            Colonoscope was introduced through the anus and                            advanced to the the cecum, identified by                            appendiceal orifice and ileocecal valve. The                            ileocecal valve, appendiceal orifice, and rectum                            were photographed. The quality of the bowel                            preparation was good. The colonoscopy was performed                            without difficulty. The patient tolerated the                            procedure well. The bowel preparation used was                            SUPREP. RECTAL EXAM: Diminished tone and palpable                            internal hemorrhoids Scope In: 11:46:49 AM Scope Out: 11:59:15 AM Scope Withdrawal Time: 0 hours 8 minutes 42 seconds  Total Procedure Duration: 0 hours 12 minutes 26 seconds  Findings:                 Multiple small and large-mouthed diverticula were                            found in the left colon and right colon.  Internal hemorrhoids were found during retroflexion.                           The exam was otherwise without abnormality on                            direct and retroflexion views.                           Biopsies for histology were taken with a cold                            forceps from the entire colon for evaluation of                            microscopic colitis. Complications:            No immediate complications. Estimated blood loss:                            None. Estimated Blood Loss:     Estimated blood loss: none. Impression:               - Diverticulosis in the left colon and in the right                            colon.                           - Internal hemorrhoids.                           - The examination was otherwise normal on direct                            and retroflexion views.                            - Biopsies were taken with a cold forceps from the                            entire colon for evaluation of microscopic colitis. Recommendation:           - Repeat colonoscopy is not recommended for                            surveillance.                           - Patient has a contact number available for                            emergencies. The signs and symptoms of potential                            delayed complications were discussed with the  patient. Return to normal activities tomorrow.                            Written discharge instructions were provided to the                            patient.                           - Resume previous diet.                           - Continue present medications.                           - Await pathology results. Docia Chuck. Henrene Pastor, MD 01/19/2017 12:05:18 PM This report has been signed electronically.

## 2017-01-19 NOTE — Progress Notes (Signed)
Pt's states no medical or surgical changes since previsit or office visit. 

## 2017-01-20 ENCOUNTER — Telehealth: Payer: Self-pay

## 2017-01-20 NOTE — Telephone Encounter (Signed)
  Follow up Call-  Call back number 01/19/2017  Post procedure Call Back phone  # [phone]  Permission to leave phone message Yes  Some recent data might be hidden     Patient questions:  Do you have a fever, pain , or abdominal swelling? No. Pain Score  0 *  Have you tolerated food without any problems? Yes.    Have you been able to return to your normal activities? Yes.    Do you have any questions about your discharge instructions: Diet   No. Medications  No. Follow up visit  No.  Do you have questions or concerns about your Care? No.  Actions: * If pain score is 4 or above: No action needed, pain <4.  No problems noted per pt. maw

## 2017-01-21 ENCOUNTER — Encounter: Payer: Self-pay | Admitting: Internal Medicine

## 2017-01-27 ENCOUNTER — Other Ambulatory Visit: Payer: Self-pay

## 2017-01-27 MED ORDER — AMLODIPINE BESYLATE 10 MG PO TABS: 10.0000 mg | ORAL_TABLET | Freq: Every day | ORAL | 2 refills | Status: DC

## 2017-01-28 ENCOUNTER — Ambulatory Visit: Payer: Medicare PPO | Admitting: Internal Medicine

## 2017-02-04 ENCOUNTER — Ambulatory Visit (INDEPENDENT_AMBULATORY_CARE_PROVIDER_SITE_OTHER): Payer: Medicare PPO | Admitting: Internal Medicine

## 2017-02-04 ENCOUNTER — Encounter: Payer: Self-pay | Admitting: Internal Medicine

## 2017-02-04 VITALS — BP 146/74 | HR 79 | Temp 97.8°F | Ht 63.0 in | Wt 159.0 lb

## 2017-02-04 DIAGNOSIS — R7302 Impaired glucose tolerance (oral): Secondary | ICD-10-CM

## 2017-02-04 DIAGNOSIS — I10 Essential (primary) hypertension: Secondary | ICD-10-CM | POA: Diagnosis not present

## 2017-02-04 DIAGNOSIS — R609 Edema, unspecified: Secondary | ICD-10-CM | POA: Diagnosis not present

## 2017-02-04 DIAGNOSIS — Z0000 Encounter for general adult medical examination without abnormal findings: Secondary | ICD-10-CM | POA: Diagnosis not present

## 2017-02-04 MED ORDER — AMLODIPINE BESYLATE 5 MG PO TABS: 5.0000 mg | ORAL_TABLET | Freq: Every day | ORAL | 3 refills | Status: DC

## 2017-02-04 NOTE — Assessment & Plan Note (Signed)
BP Readings from Last 3 Encounters:  02/04/17 (!) 146/74  01/19/17 138/60  11/06/16 (!) 144/64   stable overall by history and exam, recent data reviewed with pt, and pt to continue medical treatment as before,  to f/u any worsening symptoms or concerns, declines change to amlodipine for now or add other med

## 2017-02-04 NOTE — Progress Notes (Signed)
Pre visit review using our clinic review tool, if applicable. No additional management support is needed unless otherwise documented below in the visit note. 

## 2017-02-04 NOTE — Patient Instructions (Addendum)
Ok to decrease the amlodipine to 5 mg per day due to the left swelling  Please check your Blood Pressure at home on a regular basis,with the goal being less than 140/90  Please continue all other medications as before, and refills have been done if requested.  Please have the pharmacy call with any other refills you may need.  Please continue your efforts at being more active, low cholesterol diet, and weight control.  You are otherwise up to date with prevention measures today.  Please keep your appointments with your specialists as you may have planned  Please return in 6 months, or sooner if needed

## 2017-02-04 NOTE — Assessment & Plan Note (Signed)

## 2017-02-04 NOTE — Assessment & Plan Note (Signed)
Has had some baseline venous insufficiency now worse with increased amlodipine, but pt declines any changes for now

## 2017-02-04 NOTE — Assessment & Plan Note (Signed)
Lab Results  Component Value Date   HGBA1C 5.4 06/02/2016   stable overall by history and exam, recent data reviewed with pt, and pt to continue medical treatment as before,  to f/u any worsening symptoms or concerns

## 2017-02-04 NOTE — Progress Notes (Signed)
Subjective:    Patient ID: [name], female    DOB: 1936-01-07, 81 y.o.   MRN: [id number]  HPI  Here for wellness and f/u;  Overall doing ok;  Pt denies Chest pain, worsening SOB, DOE, wheezing, orthopnea, PND, palpitations, dizziness or syncope.  Pt denies neurological change such as new headache, facial or extremity weakness.  Pt denies polydipsia, polyuria, or low sugar symptoms. Pt states overall good compliance with treatment and medications, good tolerability, and has been trying to follow appropriate diet.  Pt denies worsening depressive symptoms, suicidal ideation or panic. No fever, night sweats, wt loss, loss of appetite, or other constitutional symptoms.  Pt states good ability with ADL's, has low fall risk, home safety reviewed and adequate, no other significant changes in hearing or vision, and only occasionally active with exercise., [facility] few [facility] b/c her doctors and daughter is asking her to do it.  . BP Readings from Last 3 Encounters:  02/04/17 (!) 146/74  01/19/17 138/60  11/06/16 (!) 144/64  Fiber metamucil has really helped Gi symptoms.  Strong FH malignancy.  Declines dxa repeat for now, last dxa 2014 normal.  Plans to see GYN for yearly exam later this yr.  Declines tdap Did have a "terrible rash "  "something set me onfire" to arms now resolved and blames her meclizine and amlodipine since rahs is on the side effect lists; Amlodipine was recently increased to 10 mg. From 5 mg and now with slight worsening LLE > RLE rash/   Has had some dizziness/vertigo occasionally. , none in last few days Past Medical History:  Diagnosis Date  . Allergic rhinitis 10/30/2016  . Anemia   . Breast cancer of upper-outer quadrant of left female breast ([place]) 11/08/2013   ER/PR+ Her2- Left IDC   . Chronic renal insufficiency   . Chronic rhinitis   . Colon polyp   . Diastolic dysfunction 5/63/1497  . DJD (degenerative joint disease)   .  Full dentures   . GERD (gastroesophageal reflux disease)   . Hypertension   . Impaired glucose tolerance 07/18/2014  . Memory loss   . Morbid obesity ([place])   . Vertigo   . Wears glasses    Past Surgical History:  Procedure Laterality Date  . ABDOMINAL HYSTERECTOMY    . BREAST LUMPECTOMY WITH NEEDLE LOCALIZATION AND AXILLARY SENTINEL LYMPH NODE BX Left 12/04/2013   Procedure: BREAST LUMPECTOMY WITH NEEDLE LOCALIZATION AND AXILLARY SENTINEL LYMPH NODE BX;  Surgeon: Shann Medal, MD;  Location: [facility];  Service: General;  Laterality: Left;  . CATARACT EXTRACTION  2009   rt  . COLONOSCOPY    . KNEE ARTHROSCOPY     both  . TONSILLECTOMY    . TOTAL KNEE ARTHROPLASTY  2002   rt  . TOTAL KNEE ARTHROPLASTY  2003   left  . VESICOVAGINAL FISTULA CLOSURE W/ TAH  1980    reports that she has never smoked. She has never used smokeless tobacco. She reports that she does not drink alcohol or use drugs. family history includes Colon cancer in her mother; Heart attack (age of onset: 85) in her son; Lung cancer in her brother; Stomach cancer in her mother. Allergies  Allergen Reactions  . Iron     REACTION: hives/whelps/bad constipation   Current Outpatient Prescriptions on File Prior to Visit  Medication Sig Dispense Refill  . amLODipine (NORVASC) 10 MG tablet Take 1  tablet (10 mg total) by mouth daily. 90 tablet 2  . aspirin 81 MG tablet Take 81 mg by mouth daily.      . Cholecalciferol (VITAMIN D3) 2000 UNITS TABS Take 1 tablet by mouth daily.     . famotidine (PEPCID) 20 MG tablet Take 20 mg by mouth at bedtime.    . fexofenadine (ALLEGRA) 180 MG tablet Take 180 mg by mouth daily as needed.      [name] glucosamine-chondroitin 500-400 MG tablet Take 1 tablet by mouth daily.     . meclizine (ANTIVERT) 12.5 MG tablet Take 1 tablet (12.5 mg total) by mouth 3 (three) times daily as needed for dizziness. 270 tablet 0  . Multiple Vitamins-Minerals (CENTRUM SILVER PO) Take 1 tablet by  mouth daily.      . pantoprazole (PROTONIX) 40 MG tablet Take 1 tablet (40 mg total) by mouth daily. 90 tablet 3  . Potassium Gluconate 550 (90 K) MG TABS Take 1 tablet by mouth daily.    . Simethicone (GAS-X PO) Take by mouth. Take as directed     . tamoxifen (NOLVADEX) 20 MG tablet TAKE 1 TABLET EVERY DAY 90 tablet 3  . traMADol (ULTRAM) 50 MG tablet Take 1 tablet (50 mg total) by mouth every 6 (six) hours as needed. for pain 120 tablet 2  . triamcinolone (NASACORT AQ) 55 MCG/ACT AERO nasal inhaler Place 2 sprays into the nose daily. 1 Inhaler 12  . valsartan-hydrochlorothiazide (DIOVAN HCT) 320-25 MG tablet Take 1 tablet by mouth daily. 90 tablet 1   Current Facility-Administered Medications on File Prior to Visit  Medication Dose Route Frequency Provider Last Rate Last Dose  . 0.9 %  sodium chloride infusion  500 mL Intravenous Continuous Irene Shipper, MD       Review of Systems Constitutional: Negative for other unusual diaphoresis, sweats, appetite or weight changes HENT: Negative for other worsening hearing loss, ear pain, facial swelling, mouth sores or neck stiffness.   Eyes: Negative for other worsening pain, redness or other visual disturbance.  Respiratory: Negative for other stridor or swelling Cardiovascular: Negative for other palpitations or other chest pain  Gastrointestinal: Negative for worsening diarrhea or loose stools, blood in stool, distention or other pain Genitourinary: Negative for hematuria, flank pain or other change in urine volume.  Musculoskeletal: Negative for myalgias or other joint swelling.  Skin: Negative for other color change, or other wound or worsening drainage.  Neurological: Negative for other syncope or numbness. Hematological: Negative for other adenopathy or swelling Psychiatric/Behavioral: Negative for hallucinations, other worsening agitation, SI, self-injury, or new decreased concentration All other system neg per pt    Objective:    Physical Exam BP (!) 146/74   Pulse 79   Temp 97.8 F (36.6 C) (Oral)   Ht '5\' 3"'$  (1.6 m)   Wt 159 lb (72.1 kg)   SpO2 99%   BMI 28.17 kg/m  VS noted,  Constitutional: Pt is oriented to person, place, and time. Appears well-developed and well-nourished, in no significant distress and comfortable Head: Normocephalic and atraumatic  Eyes: Conjunctivae and EOM are normal. Pupils are equal, round, and reactive to light Right Ear: External ear normal without discharge Left Ear: External ear normal without discharge Nose: Nose without discharge or deformity Mouth/Throat: Oropharynx is without other ulcerations and moist  Neck: Normal range of motion. Neck supple. No JVD present. No tracheal deviation present or significant neck LA or mass Cardiovascular: Normal rate, regular rhythm, normal heart sounds and intact  distal pulses.   Pulmonary/Chest: WOB normal and breath sounds without rales or wheezing  Abdominal: Soft. Bowel sounds are normal. NT. No HSM  Musculoskeletal: Normal range of motion. Exhibits 2+ LLE and 1+ RLE edema Lymphadenopathy: Has no other cervical adenopathy.  Neurological: Pt is alert and oriented to person, place, and time. Pt has normal reflexes. No cranial nerve deficit. Motor grossly intact, Gait intact Skin: Skin is warm and dry. No rash noted or new ulcerations Psychiatric:  Has mild nevous mood and affect. Behavior is normal without agitation No other exam findings    Assessment & Plan:

## 2017-03-25 ENCOUNTER — Other Ambulatory Visit: Payer: Self-pay

## 2017-03-25 MED ORDER — PANTOPRAZOLE SODIUM 40 MG PO TBEC: 40.0000 mg | DELAYED_RELEASE_TABLET | Freq: Every day | ORAL | 2 refills | Status: DC

## 2017-03-30 LAB — HM DIABETES EYE EXAM

## 2017-04-27 ENCOUNTER — Ambulatory Visit (INDEPENDENT_AMBULATORY_CARE_PROVIDER_SITE_OTHER): Payer: Medicare PPO | Admitting: Internal Medicine

## 2017-04-27 ENCOUNTER — Encounter: Payer: Self-pay | Admitting: Internal Medicine

## 2017-04-27 VITALS — BP 124/72 | HR 68 | Ht 63.0 in | Wt 160.0 lb

## 2017-04-27 DIAGNOSIS — M779 Enthesopathy, unspecified: Secondary | ICD-10-CM

## 2017-04-27 DIAGNOSIS — R7302 Impaired glucose tolerance (oral): Secondary | ICD-10-CM

## 2017-04-27 DIAGNOSIS — M109 Gout, unspecified: Secondary | ICD-10-CM

## 2017-04-27 DIAGNOSIS — I10 Essential (primary) hypertension: Secondary | ICD-10-CM

## 2017-04-27 MED ORDER — PREDNISONE 10 MG PO TABS
ORAL_TABLET | ORAL | 0 refills | Status: DC
Start: 2017-04-27 — End: 2017-05-11

## 2017-04-27 MED ORDER — HYDROCODONE-ACETAMINOPHEN 5-325 MG PO TABS: 1.0000 | ORAL_TABLET | Freq: Four times a day (QID) | ORAL | 0 refills | Status: DC | PRN

## 2017-04-27 MED ORDER — METHYLPREDNISOLONE ACETATE 80 MG/ML IJ SUSP
80.0000 mg | Freq: Once | INTRAMUSCULAR | Status: AC
  Administered 2017-04-27: 80 mg via INTRAMUSCULAR

## 2017-04-27 NOTE — Patient Instructions (Signed)
You had the steroid shot today  Please take all new medication as prescribed - the prednisone and the hydrocodone for pain  Please continue all other medications as before, and refills have been done if requested.  Please have the pharmacy call with any other refills you may need.  Please keep your appointments with your specialists as you may have planned

## 2017-04-27 NOTE — Progress Notes (Signed)
Subjective:    Patient ID: [name], female    DOB: 1936/07/13, 81 y.o.   MRN: [id number]  HPI  Here with c/o pain and stiffness mostly to the back, hips and knees without overt swelling.  Started x 1 wk, not getting better in fact getting gradually worse, now mod to severe. Nohting else seemed to make better or worse.  Did have some swelling and pain acutely to the right foot great toe and second toe joints,  Has happened in the past like this to the left second toe but resolved.  No fever.  Toe seems better after some epsom salt bath and neosporin.  Has hx of DJD per pt right foot but not sure how that relates.  No significant worsening pain to any upper extremity joint or hands.  Also has hx of left shoulder rot cuff dz and has avoided surgury for this.  Tramadol not working well for her, even in the past 2 yrs  S/p bilat knee replacements Past Medical History:  Diagnosis Date  . Allergic rhinitis 10/30/2016  . Anemia   . Breast cancer of upper-outer quadrant of left female breast ([facility]) 11/08/2013   ER/PR+ Her2- Left IDC   . Chronic renal insufficiency   . Chronic rhinitis   . Colon polyp   . Diastolic dysfunction 8/75/6433  . DJD (degenerative joint disease)   . Full dentures   . GERD (gastroesophageal reflux disease)   . Hypertension   . Impaired glucose tolerance 07/18/2014  . Memory loss   . Morbid obesity ([place])   . Vertigo   . Wears glasses    Past Surgical History:  Procedure Laterality Date  . ABDOMINAL HYSTERECTOMY    . BREAST LUMPECTOMY WITH NEEDLE LOCALIZATION AND AXILLARY SENTINEL LYMPH NODE BX Left 12/04/2013   Procedure: BREAST LUMPECTOMY WITH NEEDLE LOCALIZATION AND AXILLARY SENTINEL LYMPH NODE BX;  Surgeon: Shann Medal, MD;  Location: [facility];  Service: General;  Laterality: Left;  . CATARACT EXTRACTION  2009   rt  . COLONOSCOPY    . KNEE ARTHROSCOPY     both  . TONSILLECTOMY    . TOTAL KNEE ARTHROPLASTY  2002   rt  . TOTAL KNEE  ARTHROPLASTY  2003   left  . VESICOVAGINAL FISTULA CLOSURE W/ TAH  1980    reports that she has never smoked. She has never used smokeless tobacco. She reports that she does not drink alcohol or use drugs. family history includes Colon cancer in her mother; Heart attack (age of onset: 80) in her son; Lung cancer in her brother; Stomach cancer in her mother. Allergies  Allergen Reactions  . Iron     REACTION: hives/whelps/bad constipation   Current Outpatient Prescriptions on File Prior to Visit  Medication Sig Dispense Refill  . amLODipine (NORVASC) 5 MG tablet Take 1 tablet (5 mg total) by mouth daily. 90 tablet 3  . aspirin 81 MG tablet Take 81 mg by mouth daily.      . Cholecalciferol (VITAMIN D3) 2000 UNITS TABS Take 1 tablet by mouth daily.     . famotidine (PEPCID) 20 MG tablet Take 20 mg by mouth at bedtime.    [name] glucosamine-chondroitin 500-400 MG tablet Take 1 tablet by mouth daily.     . meclizine (ANTIVERT) 12.5 MG tablet Take 1 tablet (12.5 mg total) by mouth 3 (three) times daily as needed for dizziness. 270 tablet 0  . Multiple Vitamins-Minerals (CENTRUM SILVER PO) Take 1 tablet  by mouth daily.      . pantoprazole (PROTONIX) 40 MG tablet Take 1 tablet (40 mg total) by mouth daily. 90 tablet 2  . Potassium Gluconate 550 (90 K) MG TABS Take 1 tablet by mouth daily.    . Simethicone (GAS-X PO) Take by mouth. Take as directed     . tamoxifen (NOLVADEX) 20 MG tablet TAKE 1 TABLET EVERY DAY 90 tablet 3  . traMADol (ULTRAM) 50 MG tablet Take 1 tablet (50 mg total) by mouth every 6 (six) hours as needed. for pain 120 tablet 2  . triamcinolone (NASACORT AQ) 55 MCG/ACT AERO nasal inhaler Place 2 sprays into the nose daily. 1 Inhaler 12  . valsartan-hydrochlorothiazide (DIOVAN HCT) 320-25 MG tablet Take 1 tablet by mouth daily. 90 tablet 1   No current facility-administered medications on file prior to visit.    Review of Systems  Constitutional: Negative for other unusual  diaphoresis or sweats HENT: Negative for ear discharge or swelling Eyes: Negative for other worsening visual disturbances Respiratory: Negative for stridor or other swelling  Gastrointestinal: Negative for worsening distension or other blood Genitourinary: Negative for retention or other urinary change Musculoskeletal: Negative for other MSK pain or swelling Skin: Negative for color change or other new lesions Neurological: Negative for worsening tremors and other numbness  Psychiatric/Behavioral: Negative for worsening agitation or other fatigue All other system neg per pt    Objective:   Physical Exam BP 124/72   Pulse 68   Ht '5\' 3"'$  (1.6 m)   Wt 160 lb (72.6 kg)   SpO2 98%   BMI 28.34 kg/m  VS noted,  Constitutional: Pt appears in NAD HENT: Head: NCAT.  Right Ear: External ear normal.  Left Ear: External ear normal.  Eyes: . Pupils are equal, round, and reactive to light. Conjunctivae and EOM are normal Nose: without d/c or deformity Neck: Neck supple. Gross normal ROM Cardiovascular: Normal rate and regular rhythm.   Pulmonary/Chest: Effort normal and breath sounds without rales or wheezing.  Abd:  Soft, NT, ND, + BS, no organomegaly Neurological: Pt is alert. At baseline orientation, motor grossly intact Skin: Skin is warm. No rashes, other new lesions, no LE edema except 1-2+ right distal toes > left tender, swelling, mild erythema Psychiatric: Pt behavior is normal without agitation  No other exam findings  Lab Results  Component Value Date   WBC 6.3 07/27/2016   HGB 10.2 (L) 07/27/2016   HCT 31.3 (L) 07/27/2016   PLT 200 07/27/2016   GLUCOSE 136 07/27/2016   CHOL 152 06/02/2016   TRIG 117.0 06/02/2016   HDL 52.50 06/02/2016   LDLCALC 76 06/02/2016   ALT 18 07/27/2016   AST 23 07/27/2016   NA 138 07/27/2016   K 4.4 07/27/2016   CL 100 06/02/2016   CREATININE 1.4 (H) 07/27/2016   BUN 24.7 07/27/2016   CO2 25 07/27/2016   TSH 1.15 11/25/2015   HGBA1C 5.4  06/02/2016       Assessment & Plan:

## 2017-04-30 ENCOUNTER — Other Ambulatory Visit: Payer: Self-pay | Admitting: Internal Medicine

## 2017-05-01 DIAGNOSIS — M109 Gout, unspecified: Secondary | ICD-10-CM | POA: Insufficient documentation

## 2017-05-01 NOTE — Assessment & Plan Note (Signed)
Mild to mod, for depomedrol IM 80, predpac asd, pain control,  to f/u any worsening symptoms or concerns

## 2017-05-01 NOTE — Assessment & Plan Note (Signed)
stable overall by history and exam, recent data reviewed with pt, and pt to continue medical treatment as before,  to f/u any worsening symptoms or concerns ' BP Readings from Last 3 Encounters:  04/27/17 124/72  02/04/17 (!) 146/74  01/19/17 138/60

## 2017-05-01 NOTE — Assessment & Plan Note (Signed)
stable overall by history and exam, recent data reviewed with pt, and pt to continue medical treatment as before,  to f/u any worsening symptoms or concerns Lab Results  Component Value Date   HGBA1C 5.4 06/02/2016

## 2017-05-11 ENCOUNTER — Encounter: Payer: Self-pay | Admitting: Internal Medicine

## 2017-05-11 ENCOUNTER — Ambulatory Visit (INDEPENDENT_AMBULATORY_CARE_PROVIDER_SITE_OTHER): Payer: Medicare PPO | Admitting: Internal Medicine

## 2017-05-11 VITALS — BP 144/74 | HR 73 | Ht 63.0 in | Wt 160.0 lb

## 2017-05-11 DIAGNOSIS — M069 Rheumatoid arthritis, unspecified: Secondary | ICD-10-CM | POA: Insufficient documentation

## 2017-05-11 DIAGNOSIS — I10 Essential (primary) hypertension: Secondary | ICD-10-CM

## 2017-05-11 DIAGNOSIS — R7302 Impaired glucose tolerance (oral): Secondary | ICD-10-CM | POA: Diagnosis not present

## 2017-05-11 MED ORDER — METHYLPREDNISOLONE ACETATE 80 MG/ML IJ SUSP
80.0000 mg | Freq: Once | INTRAMUSCULAR | Status: AC
  Administered 2017-05-11: 80 mg via INTRAMUSCULAR

## 2017-05-11 MED ORDER — HYDROCODONE-ACETAMINOPHEN 5-325 MG PO TABS: 1.0000 | ORAL_TABLET | Freq: Four times a day (QID) | ORAL | 0 refills | Status: DC | PRN

## 2017-05-11 MED ORDER — PREDNISONE 10 MG PO TABS: 10.0000 mg | ORAL_TABLET | Freq: Every day | ORAL | 0 refills | Status: AC

## 2017-05-11 NOTE — Patient Instructions (Signed)
You had the steroid shot today  Please take all new medication as prescribed - the prednisone and the pain medication as needed  You will be contacted regarding the referral for: Rheumatology  Please continue all other medications as before, and refills have been done if requested.  Please have the pharmacy call with any other refills you may need.  Please keep your appointments with your specialists as you may have planned

## 2017-05-11 NOTE — Assessment & Plan Note (Signed)
stable overall by history and exam, recent data reviewed with pt, and pt to continue medical treatment as before,  to f/u any worsening symptoms or concerns Lab Results  Component Value Date   HGBA1C 5.4 06/02/2016

## 2017-05-11 NOTE — Progress Notes (Signed)
 Subjective:    Patient ID: [name], female    DOB: 04/14/1936, 81 y.o.   MRN: [id number]  HPI   Here after success with last visit tx steroid tx for ? acute gout, but pain relief only lasted over a wk, now with recurrence of similar back, hip and upper leg pain as well as bilat hand pain, has hx of RA but not followed per rheumatology recenly as process has been slow going for many years.   Pt denies fever, wt loss, night sweats, loss of appetite, or other constitutional symptoms  Denies urinary symptoms such as dysuria, frequency, urgency, flank pain, hematuria or n/v, fever, chills.  Denies worsening reflux, abd pain, dysphagia, n/v, bowel change or blood.   Pt denies polydipsia, polyuria,     Past Medical History:  Diagnosis Date  . Allergic rhinitis 10/30/2016  . Anemia   . Breast cancer of upper-outer quadrant of left female breast ([facility]) 11/08/2013   ER/PR+ Her2- Left IDC   . Chronic renal insufficiency   . Chronic rhinitis   . Colon polyp   . Diastolic dysfunction 07/10/2016  . DJD (degenerative joint disease)   . Full dentures   . GERD (gastroesophageal reflux disease)   . Hypertension   . Impaired glucose tolerance 07/18/2014  . Memory loss   . Morbid obesity ([facility])   . Vertigo   . Wears glasses    Past Surgical History:  Procedure Laterality Date  . ABDOMINAL HYSTERECTOMY    . BREAST LUMPECTOMY WITH NEEDLE LOCALIZATION AND AXILLARY SENTINEL LYMPH NODE BX Left 12/04/2013   Procedure: BREAST LUMPECTOMY WITH NEEDLE LOCALIZATION AND AXILLARY SENTINEL LYMPH NODE BX;  Surgeon: David H Newman, MD;  Location: [facility];  Service: General;  Laterality: Left;  . CATARACT EXTRACTION  2009   rt  . COLONOSCOPY    . KNEE ARTHROSCOPY     both  . TONSILLECTOMY    . TOTAL KNEE ARTHROPLASTY  2002   rt  . TOTAL KNEE ARTHROPLASTY  2003   left  . VESICOVAGINAL FISTULA CLOSURE W/ TAH  1980    reports that she has never smoked. She has never used smokeless tobacco.  She reports that she does not drink alcohol or use drugs. family history includes Colon cancer in her mother; Heart attack (age of onset: 51) in her son; Heart disease in her unknown relative; Lung cancer in her brother; Stomach cancer in her mother. Allergies  Allergen Reactions  . Iron     REACTION: hives/whelps/bad constipation   Current Outpatient Prescriptions on File Prior to Visit  Medication Sig Dispense Refill  . amLODipine (NORVASC) 5 MG tablet Take 1 tablet (5 mg total) by mouth daily. 90 tablet 3  . aspirin 81 MG tablet Take 81 mg by mouth daily.      . Cholecalciferol (VITAMIN D3) 2000 UNITS TABS Take 1 tablet by mouth daily.     . famotidine (PEPCID) 20 MG tablet Take 20 mg by mouth at bedtime.    . glucosamine-chondroitin 500-400 MG tablet Take 1 tablet by mouth daily.     . meclizine (ANTIVERT) 12.5 MG tablet Take 1 tablet (12.5 mg total) by mouth 3 (three) times daily as needed for dizziness. 270 tablet 0  . Multiple Vitamins-Minerals (CENTRUM SILVER PO) Take 1 tablet by mouth daily.      . pantoprazole (PROTONIX) 40 MG tablet Take 1 tablet (40 mg total) by mouth daily. 90 tablet 2  . Potassium Gluconate 550 (  90 K) MG TABS Take 1 tablet by mouth daily.    . Simethicone (GAS-X PO) Take by mouth. Take as directed     . tamoxifen (NOLVADEX) 20 MG tablet TAKE 1 TABLET EVERY DAY 90 tablet 3  . traMADol (ULTRAM) 50 MG tablet Take 1 tablet (50 mg total) by mouth every 6 (six) hours as needed. for pain 120 tablet 2  . triamcinolone (NASACORT AQ) 55 MCG/ACT AERO nasal inhaler Place 2 sprays into the nose daily. 1 Inhaler 12  . valsartan-hydrochlorothiazide (DIOVAN-HCT) 320-25 MG tablet TAKE 1 TABLET EVERY DAY 90 tablet 2   No current facility-administered medications on file prior to visit.    Review of Systems  Constitutional: Negative for other unusual diaphoresis or sweats HENT: Negative for ear discharge or swelling Eyes: Negative for other worsening visual  disturbances Respiratory: Negative for stridor or other swelling  Gastrointestinal: Negative for worsening distension or other blood Genitourinary: Negative for retention or other urinary change Musculoskeletal: Negative for other MSK pain or swelling Skin: Negative for color change or other new lesions Neurological: Negative for worsening tremors and other numbness  Psychiatric/Behavioral: Negative for worsening agitation or other fatigue All other system neg per pt    Objective:   Physical Exam BP (!) 144/74   Pulse 73   Ht 5' 3" (1.6 m)   Wt 160 lb (72.6 kg)   SpO2 99%   BMI 28.34 kg/m  VS noted,  Constitutional: Pt appears in NAD HENT: Head: NCAT.  Right Ear: External ear normal.  Left Ear: External ear normal.  Eyes: . Pupils are equal, round, and reactive to light. Conjunctivae and EOM are normal Nose: without d/c or deformity Neck: Neck supple. Gross normal ROM Cardiovascular: Normal rate and regular rhythm.   Pulmonary/Chest: Effort normal and breath sounds without rales or wheezing.  Abd soft NT Spine nontender, bilat hips neg for tender over the greater trochanters, no swelling or effusions but has mild tender bilat MCP's Neurological: Pt is alert. At baseline orientation, motor grossly intact Skin: Skin is warm. No rashes, other new lesions, no LE edema Psychiatric: Pt behavior is normal without agitation  No other exam findings  Lab Results  Component Value Date   WBC 6.3 07/27/2016   HGB 10.2 (L) 07/27/2016   HCT 31.3 (L) 07/27/2016   PLT 200 07/27/2016   GLUCOSE 136 07/27/2016   CHOL 152 06/02/2016   TRIG 117.0 06/02/2016   HDL 52.50 06/02/2016   LDLCALC 76 06/02/2016   ALT 18 07/27/2016   AST 23 07/27/2016   NA 138 07/27/2016   K 4.4 07/27/2016   CL 100 06/02/2016   CREATININE 1.4 (H) 07/27/2016   BUN 24.7 07/27/2016   CO2 25 07/27/2016   TSH 1.15 11/25/2015   HGBA1C 5.4 06/02/2016        Assessment & Plan:   

## 2017-05-11 NOTE — Assessment & Plan Note (Signed)
Mild to mod, for repeat depomedrol IM 80, predpac asd, pain control, and refer rheumatology,  to f/u any worsening symptoms or concerns

## 2017-05-11 NOTE — Assessment & Plan Note (Signed)
Mild elevated likely situational, o/w stable overall by history and exam, recent data reviewed with pt, and pt to continue medical treatment as before,  to f/u any worsening symptoms or concerns BP Readings from Last 3 Encounters:  05/11/17 (!) 144/74  04/27/17 124/72  02/04/17 (!) 146/74

## 2017-05-13 ENCOUNTER — Ambulatory Visit: Payer: Medicare PPO | Admitting: Cardiovascular Disease

## 2017-05-24 ENCOUNTER — Telehealth: Payer: Self-pay | Admitting: Internal Medicine

## 2017-05-24 MED ORDER — LOSARTAN POTASSIUM-HCTZ 100-25 MG PO TABS: 1.0000 | ORAL_TABLET | Freq: Every day | ORAL | 3 refills | Status: DC

## 2017-05-24 NOTE — Telephone Encounter (Signed)
Pt called about the recall on her valsartan-hydrochlorothiazide (DIOVAN-HCT) 320-25 MG tablet. She wanted to find out what she needed to do.

## 2017-05-24 NOTE — Telephone Encounter (Signed)
Forward to MD desktop for his recommendation once he return on tomorrow...[name]

## 2017-05-24 NOTE — Telephone Encounter (Signed)
Ok for change to losartan HCT 100/25 - done to [place]

## 2017-05-25 MED ORDER — LOSARTAN POTASSIUM-HCTZ 100-25 MG PO TABS: 1.0000 | ORAL_TABLET | Freq: Every day | ORAL | 0 refills | Status: DC

## 2017-05-25 NOTE — Telephone Encounter (Signed)
Notified pt w/MD recommendation, also sent a 10 day supply to [facility] until she receive mail order...[name]

## 2017-05-27 ENCOUNTER — Ambulatory Visit: Payer: Medicare PPO | Admitting: Cardiology

## 2017-06-14 ENCOUNTER — Telehealth: Payer: Self-pay | Admitting: Internal Medicine

## 2017-06-14 MED ORDER — PREDNISONE 10 MG PO TABS: ORAL_TABLET | ORAL | 0 refills | Status: DC

## 2017-06-14 NOTE — Addendum Note (Signed)
Addended by: Earnstine Regal on: 06/14/2017 03:38 PM   Modules accepted: Orders

## 2017-06-14 NOTE — Telephone Encounter (Signed)
[place] for repeat prednisone, but I dont have a pharmacy on record locally to sent it  OK to send rx to pt local pharmacy

## 2017-06-14 NOTE — Telephone Encounter (Signed)
Medication needs to be sent to [facility] on [address] [facility]. Can we please send this there. And take off the [facility]. Thank you.

## 2017-06-14 NOTE — Telephone Encounter (Signed)
Pt called stating that she was recently seen by Dr Jenny Reichmann for gout. She was referred to a Rheumatologist but they can not see her until September 28th. She is in a lot of pain and has run out of the previous pain medication that was prescribed by Dr Jenny Reichmann. She wanted to know if Dr Jenny Reichmann could give her another prescription to help get her through until her appointment with Rheumatology. Please advise.

## 2017-06-14 NOTE — Telephone Encounter (Signed)
Called pt no answer LMOM MD ok rx will send rx to [facility] on [address]..lmb

## 2017-06-14 NOTE — Telephone Encounter (Signed)
Updated pharmacy resent to [place]...[name]

## 2017-06-15 NOTE — Progress Notes (Signed)
 Cardiology Office Note    Date:  06/16/2017   ID:  [name], DOB 04/20/1936, MRN [id number]  PCP:  John, James W, MD  Cardiologist:  Dr. Nahser  Chief Complaint: Shortness of breath   History of Present Illness:   [name] is a 81 y.o. female with hx of sinus bradycardia, HTN, breast cancer and chronic renal insufficiency presented for Sob.    Seen by Dr. Nasher once 05/02/15 for sinus bradycardia. Felt benign and asymptomatic. No indication for pacer. Advised to follow up PRN.   Today patient referred by GYN for evaluation of shortness of breath and palpitation. Patient reports history of palpitation for many years lasting for few seconds with self resolution. Also has a history of PVC. Last episode many months ago. Denies syncope. Intermittent dizziness due to vertigo. Patient also has progressive worsening of chest tightness and shortness of breath with walking to the mailbox. No regular exercise. No radiation, nausea, vomiting or diaphoresis. Her symptoms resolved with rest. She denies orthopnea, PND, syncope, melena, blood in her stool or urine, or  Headache Recently treated with steroid for acute gout and dealing with vertigo.  Past Medical History:  Diagnosis Date  . Allergic rhinitis 10/30/2016  . Anemia   . Breast cancer of upper-outer quadrant of left female breast (HCC) 11/08/2013   ER/PR+ Her2- Left IDC   . Chronic renal insufficiency   . Chronic rhinitis   . Colon polyp   . Diastolic dysfunction 07/10/2016  . DJD (degenerative joint disease)   . Full dentures   . GERD (gastroesophageal reflux disease)   . Hypertension   . Impaired glucose tolerance 07/18/2014  . Memory loss   . Morbid obesity (HCC)   . Vertigo   . Wears glasses     Past Surgical History:  Procedure Laterality Date  . ABDOMINAL HYSTERECTOMY    . BREAST LUMPECTOMY WITH NEEDLE LOCALIZATION AND AXILLARY SENTINEL LYMPH NODE BX Left 12/04/2013   Procedure: BREAST LUMPECTOMY WITH  NEEDLE LOCALIZATION AND AXILLARY SENTINEL LYMPH NODE BX;  Surgeon: David H Newman, MD;  Location: [facility];  Service: General;  Laterality: Left;  . CATARACT EXTRACTION  2009   rt  . COLONOSCOPY    . KNEE ARTHROSCOPY     both  . TONSILLECTOMY    . TOTAL KNEE ARTHROPLASTY  2002   rt  . TOTAL KNEE ARTHROPLASTY  2003   left  . VESICOVAGINAL FISTULA CLOSURE W/ TAH  1980    Current Medications: Prior to Admission medications   Medication Sig Start Date End Date Taking? Authorizing Provider  amLODipine (NORVASC) 5 MG tablet Take 1 tablet (5 mg total) by mouth daily. 02/04/17   John, James W, MD  aspirin 81 MG tablet Take 81 mg by mouth daily.      [provider]  Cholecalciferol (VITAMIN D3) 2000 UNITS TABS Take 1 tablet by mouth daily.     [provider]  famotidine (PEPCID) 20 MG tablet Take 20 mg by mouth at bedtime.    [provider]  glucosamine-chondroitin 500-400 MG tablet Take 1 tablet by mouth daily.     [provider]  HYDROcodone-acetaminophen (NORCO/VICODIN) 5-325 MG tablet Take 1 tablet by mouth every 6 (six) hours as needed for moderate pain. 05/11/17   John, James W, MD  losartan-hydrochlorothiazide (HYZAAR) 100-25 MG tablet Take 1 tablet by mouth daily. 05/25/17   John, James W, MD  meclizine (ANTIVERT) 12.5 MG tablet Take 1 tablet (  12.5 mg total) by mouth 3 (three) times daily as needed for dizziness. 01/14/17 01/14/18  Biagio Borg, MD  Multiple Vitamins-Minerals (CENTRUM SILVER PO) Take 1 tablet by mouth daily.      [provider]  pantoprazole (PROTONIX) 40 MG tablet Take 1 tablet (40 mg total) by mouth daily. 03/25/17   Biagio Borg, MD  Potassium Gluconate 550 (90 K) MG TABS Take 1 tablet by mouth daily. 10/31/15   Magrinat, [name], MD  predniSONE (DELTASONE) 10 MG tablet 3 tabs by mouth per day for 3 days,2tabs per day for 3 days,1tab per day for 3 days 06/14/17   Biagio Borg, MD  Simethicone (GAS-X PO) Take  by mouth. Take as directed     [provider]  tamoxifen (NOLVADEX) 20 MG tablet TAKE 1 TABLET EVERY DAY 11/12/16   Magrinat, [name], MD  traMADol (ULTRAM) 50 MG tablet Take 1 tablet (50 mg total) by mouth every 6 (six) hours as needed. for pain 01/19/17   Biagio Borg, MD  triamcinolone (NASACORT AQ) 55 MCG/ACT AERO nasal inhaler Place 2 sprays into the nose daily. 10/30/16   Biagio Borg, MD    Allergies:   Iron   Social History   Social History  . Marital status: Widowed    Spouse name: N/A  . Number of children: 2  . Years of education: N/A   Occupational History  . owns bakery and works PT for [employer]    Social History Main Topics  . Smoking status: Never Smoker  . Smokeless tobacco: Never Used  . Alcohol use No  . Drug use: No  . Sexual activity: Not Currently   Other Topics Concern  . None   Social History Narrative  . None     Family History:  The patient's family history includes Colon cancer in her mother; Heart attack (age of onset: 6) in her son; Heart disease in her unknown relative; Lung cancer in her brother; Stomach cancer in her mother.   ROS:   Please see the history of present illness.    ROS All other systems reviewed and are negative.   PHYSICAL EXAM:   VS:  BP 130/68   Pulse 73   Ht 5' 3" (1.6 m)   Wt 164 lb (74.4 kg)   BMI 29.05 kg/m    GEN: Well nourished, well developed, in no acute distress  HEENT: normal  Neck: no JVD, carotid bruits, or masses Cardiac: RRR; no murmurs, rubs, or gallops, trace bilateral ankle edema  Respiratory:  clear to auscultation bilaterally, normal work of breathing GI: soft, nontender, nondistended, + BS MS: no deformity or atrophy  Skin: warm and dry, no rash Neuro:  Alert and Oriented x 3, Strength and sensation are intact Psych: euthymic mood, full affect  Wt Readings from Last 3 Encounters:  06/16/17 164 lb (74.4 kg)  05/11/17 160 lb (72.6 kg)  04/27/17 160 lb (72.6 kg)       Studies/Labs Reviewed:   EKG:  EKG is ordered today.  The ekg ordered today demonstrates sinus rhythm with PVCs  Recent Labs: 07/27/2016: ALT 18; BUN 24.7; Creatinine 1.4; HGB 10.2; Platelets 200; Potassium 4.4; Sodium 138   Lipid Panel    Component Value Date/Time   CHOL 152 06/02/2016 1038   TRIG 117.0 06/02/2016 1038   HDL 52.50 06/02/2016 1038   CHOLHDL 3 06/02/2016 1038   VLDL 23.4 06/02/2016 1038   LDLCALC 76 06/02/2016 1038  Additional studies/ records that were reviewed today include:   Echocardiogram: 03/27/15 Study Conclusions  - Left ventricle: The cavity size was normal. Systolic function was   normal. The estimated ejection fraction was in the range of 55%   to 60%. Wall motion was normal; there were no regional wall   motion abnormalities. Doppler parameters are consistent with   abnormal left ventricular relaxation (grade 1 diastolic   dysfunction). - Left atrium: The atrium was mildly dilated. - Tricuspid valve: There was mild-moderate regurgitation directed   centrally.  ASSESSMENT & PLAN:    1. Shortness of breath/chest tightness - Progressively worsened over the past few months. Could be anginal equivalent. Will get lexiscan. Less likely her symptoms from palpitations. She denies orthopnea, PND or syncope.  2. Palpitations - Long-standing history. No recent episode in past few months. This has been stable. Hx of long standing PVCs as well. Asymptomatic. Not on BB due to hx of bradycardia. She will call of if worsen symptoms.   3. HTN - Controlled on current regimen.      Medication Adjustments/Labs and Tests Ordered: Current medicines are reviewed at length with the patient today.  Concerns regarding medicines are outlined above.  Medication changes, Labs and Tests ordered today are listed in the Patient Instructions below. Patient Instructions  Medication Instructions:  Your physician recommends that you continue on your current  medications as directed. Please refer to the Current Medication list given to you today.  Labwork: None ordered  Testing/Procedures: Your physician has requested that you have a lexiscan myoview. For further information please visit [email or web address]. Please follow instruction sheet, as given.    Follow-Up: Your physician recommends that you schedule a follow-up appointment in: BASED UPON TEST RESULTS   Any Other Special Instructions Will Be Listed Below (If Applicable).  Regadenoson injection What is this medicine? REGADENOSON is used to test the heart for coronary artery disease. It is used in patients who can not exercise for their stress test. This medicine may be used for other purposes; ask your health care provider or pharmacist if you have questions. COMMON BRAND NAME(S): Lexiscan What should I tell my health care provider before I take this medicine? They need to know if you have any of these conditions: -heart problems -lung or breathing disease, like asthma or COPD -an unusual or allergic reaction to regadenoson, other medicines, foods, dyes, or preservatives -pregnant or trying to get pregnant -breast-feeding How should I use this medicine? This medicine is for injection into a vein. It is given by a health care professional in a hospital or clinic setting. Talk to your pediatrician regarding the use of this medicine in children. Special care may be needed. Overdosage: If you think you have taken too much of this medicine contact a poison control center or emergency room at once. NOTE: This medicine is only for you. Do not share this medicine with others. What if I miss a dose? This does not apply. What may interact with this medicine? -caffeine -dipyridamole -guarana -theophylline This list may not describe all possible interactions. Give your health care provider a list of all the medicines, herbs, non-prescription drugs, or dietary supplements you use. Also  tell them if you smoke, drink alcohol, or use illegal drugs. Some items may interact with your medicine. What should I watch for while using this medicine? Your condition will be monitored carefully while you are receiving this medicine. Do not take medicines, foods, or drinks with caffeine (like coffee, tea, or  colas) for at least 12 hours before your test. If you do not know if something contains caffeine, ask your health care professional. What side effects may I notice from receiving this medicine? Side effects that you should report to your doctor or health care professional as soon as possible: -allergic reactions like skin rash, itching or hives, swelling of the face, lips, or tongue -breathing problems -chest pain, tightness or palpitations -severe headache Side effects that usually do not require medical attention (report to your doctor or health care professional if they continue or are bothersome): -flushing -headache -irritation or pain at site where injected -nausea, vomiting This list may not describe all possible side effects. Call your doctor for medical advice about side effects. You may report side effects to FDA at 1-800-FDA-1088. Where should I keep my medicine? This drug is given in a hospital or clinic and will not be stored at home. NOTE: This sheet is a summary. It may not cover all possible information. If you have questions about this medicine, talk to your doctor, pharmacist, or health care provider.  2018 Elsevier/Gold Standard (2008-06-11 15:08:13)    If you need a refill on your cardiac medications before your next appointment, please call your pharmacy.      Jarrett Soho, [place]  06/16/2017 9:09 AM    [facility] [address], [place], [place]  [address] Phone: [phone]; Fax: [phone]

## 2017-06-16 ENCOUNTER — Ambulatory Visit (INDEPENDENT_AMBULATORY_CARE_PROVIDER_SITE_OTHER): Payer: Medicare PPO | Admitting: Physician Assistant

## 2017-06-16 ENCOUNTER — Encounter: Payer: Self-pay | Admitting: Physician Assistant

## 2017-06-16 VITALS — BP 130/68 | HR 73 | Ht 63.0 in | Wt 164.0 lb

## 2017-06-16 DIAGNOSIS — R000 Tachycardia, unspecified: Secondary | ICD-10-CM

## 2017-06-16 DIAGNOSIS — I10 Essential (primary) hypertension: Secondary | ICD-10-CM | POA: Diagnosis not present

## 2017-06-16 DIAGNOSIS — R0789 Other chest pain: Secondary | ICD-10-CM | POA: Diagnosis not present

## 2017-06-16 NOTE — Patient Instructions (Addendum)
Medication Instructions:  Your physician recommends that you continue on your current medications as directed. Please refer to the Current Medication list given to you today.  Labwork: None ordered  Testing/Procedures: Your physician has requested that you have a lexiscan myoview. For further information please visit [email or web address]. Please follow instruction sheet, as given.    Follow-Up: Your physician recommends that you schedule a follow-up appointment in: BASED UPON TEST RESULTS   Any Other Special Instructions Will Be Listed Below (If Applicable).  Regadenoson injection What is this medicine? REGADENOSON is used to test the heart for coronary artery disease. It is used in patients who can not exercise for their stress test. This medicine may be used for other purposes; ask your health care provider or pharmacist if you have questions. COMMON BRAND NAME(S): Lexiscan What should I tell my health care provider before I take this medicine? They need to know if you have any of these conditions: -heart problems -lung or breathing disease, like asthma or COPD -an unusual or allergic reaction to regadenoson, other medicines, foods, dyes, or preservatives -pregnant or trying to get pregnant -breast-feeding How should I use this medicine? This medicine is for injection into a vein. It is given by a health care professional in a hospital or clinic setting. Talk to your pediatrician regarding the use of this medicine in children. Special care may be needed. Overdosage: If you think you have taken too much of this medicine contact a poison control center or emergency room at once. NOTE: This medicine is only for you. Do not share this medicine with others. What if I miss a dose? This does not apply. What may interact with this medicine? -caffeine -dipyridamole -guarana -theophylline This list may not describe all possible interactions. Give your health care provider a list of  all the medicines, herbs, non-prescription drugs, or dietary supplements you use. Also tell them if you smoke, drink alcohol, or use illegal drugs. Some items may interact with your medicine. What should I watch for while using this medicine? Your condition will be monitored carefully while you are receiving this medicine. Do not take medicines, foods, or drinks with caffeine (like coffee, tea, or colas) for at least 12 hours before your test. If you do not know if something contains caffeine, ask your health care professional. What side effects may I notice from receiving this medicine? Side effects that you should report to your doctor or health care professional as soon as possible: -allergic reactions like skin rash, itching or hives, swelling of the face, lips, or tongue -breathing problems -chest pain, tightness or palpitations -severe headache Side effects that usually do not require medical attention (report to your doctor or health care professional if they continue or are bothersome): -flushing -headache -irritation or pain at site where injected -nausea, vomiting This list may not describe all possible side effects. Call your doctor for medical advice about side effects. You may report side effects to FDA at 1-800-FDA-1088. Where should I keep my medicine? This drug is given in a hospital or clinic and will not be stored at home. NOTE: This sheet is a summary. It may not cover all possible information. If you have questions about this medicine, talk to your doctor, pharmacist, or health care provider.  2018 Elsevier/Gold Standard (2008-06-11 15:08:13)    If you need a refill on your cardiac medications before your next appointment, please call your pharmacy.

## 2017-06-17 ENCOUNTER — Telehealth (HOSPITAL_COMMUNITY): Payer: Self-pay | Admitting: *Deleted

## 2017-06-17 NOTE — Telephone Encounter (Signed)
Left message on voicemail in reference to upcoming appointment scheduled for  06/22/17. Phone number given for a call back so details instructions can be given.  [name]

## 2017-06-18 ENCOUNTER — Telehealth (HOSPITAL_COMMUNITY): Payer: Self-pay | Admitting: Radiology

## 2017-06-18 NOTE — Telephone Encounter (Signed)
Patient given detailed instructions per Myocardial Perfusion Study Information Sheet for the test on 06/22/2017 at 7:30. Patient notified to arrive 15 minutes early and that it is imperative to arrive on time for appointment to keep from having the test rescheduled.  If you need to cancel or reschedule your appointment, please call the office within 24 hours of your appointment. . Patient verbalized understanding.EHK

## 2017-06-22 ENCOUNTER — Ambulatory Visit (HOSPITAL_COMMUNITY): Payer: Medicare PPO | Attending: Cardiology

## 2017-06-22 DIAGNOSIS — R9439 Abnormal result of other cardiovascular function study: Secondary | ICD-10-CM | POA: Insufficient documentation

## 2017-06-22 DIAGNOSIS — R0789 Other chest pain: Secondary | ICD-10-CM | POA: Diagnosis present

## 2017-06-22 LAB — MYOCARDIAL PERFUSION IMAGING
LV dias vol: 85 mL (ref 46–106)
LV sys vol: 24 mL
Peak HR: 82 {beats}/min
RATE: 0.33
Rest HR: 65 {beats}/min
SDS: 7
SRS: 1
SSS: 8
TID: 0.81

## 2017-06-22 MED ORDER — REGADENOSON 0.4 MG/5ML IV SOLN
0.4000 mg | Freq: Once | INTRAVENOUS | Status: AC
  Administered 2017-06-22: 0.4 mg via INTRAVENOUS

## 2017-06-22 MED ORDER — TECHNETIUM TC 99M TETROFOSMIN IV KIT
32.0000 | PACK | Freq: Once | INTRAVENOUS | Status: AC | PRN
  Administered 2017-06-22: 32 via INTRAVENOUS
  Filled 2017-06-22: qty 32

## 2017-06-22 MED ORDER — TECHNETIUM TC 99M TETROFOSMIN IV KIT
10.2000 | PACK | Freq: Once | INTRAVENOUS | Status: AC | PRN
  Administered 2017-06-22: 10.2 via INTRAVENOUS
  Filled 2017-06-22: qty 11

## 2017-06-29 ENCOUNTER — Other Ambulatory Visit: Payer: Self-pay | Admitting: Internal Medicine

## 2017-06-30 ENCOUNTER — Telehealth: Payer: Self-pay | Admitting: Internal Medicine

## 2017-06-30 MED ORDER — AMLODIPINE BESYLATE 5 MG PO TABS: 5.0000 mg | ORAL_TABLET | Freq: Every day | ORAL | 1 refills | Status: DC

## 2017-06-30 NOTE — Telephone Encounter (Signed)
Reviewed chart pt is up-to-date sent refills to [facility]  

## 2017-06-30 NOTE — Telephone Encounter (Signed)
Patient states she needs amplodipine to be sent to [facility].

## 2017-07-09 ENCOUNTER — Other Ambulatory Visit: Payer: Self-pay | Admitting: Internal Medicine

## 2017-07-09 MED ORDER — HYDROCODONE-ACETAMINOPHEN 5-325 MG PO TABS: 1.0000 | ORAL_TABLET | Freq: Four times a day (QID) | ORAL | 0 refills | Status: DC | PRN

## 2017-07-09 NOTE — Telephone Encounter (Signed)
Done hardcopy to Shir[facility]  

## 2017-07-09 NOTE — Telephone Encounter (Signed)
Pt has been informed Script at front desk  

## 2017-07-09 NOTE — Telephone Encounter (Signed)
Pt states she is in a lot of pain and can hardly walk, she would like a refill of her HYDROcodone-acetaminophen (NORCO/VICODIN) 5-325 MG tablet  Please advise

## 2017-08-06 ENCOUNTER — Ambulatory Visit: Payer: Medicare PPO | Admitting: Internal Medicine

## 2017-08-09 ENCOUNTER — Telehealth: Payer: Self-pay | Admitting: Internal Medicine

## 2017-08-09 ENCOUNTER — Other Ambulatory Visit: Payer: Self-pay

## 2017-08-09 MED ORDER — TAMOXIFEN CITRATE 20 MG PO TABS: 20.0000 mg | ORAL_TABLET | Freq: Every day | ORAL | 3 refills | Status: DC

## 2017-08-09 MED ORDER — TRAMADOL HCL 50 MG PO TABS: 50.0000 mg | ORAL_TABLET | Freq: Four times a day (QID) | ORAL | 2 refills | Status: DC | PRN

## 2017-08-09 NOTE — Progress Notes (Deleted)
Subjective:   [name] is a 81 y.o. female who presents for Medicare Annual (Subsequent) preventive examination.  Review of Systems:  No ROS.  Medicare Wellness Visit. Additional risk factors are reflected in the social history.    Sleep patterns: {SX; SLEEP PATTERNS:18802::"feels rested on waking","does not get up to void","gets up *** times nightly to void","sleeps *** hours nightly"}.    Home Safety/Smoke Alarms: Feels safe in home. Smoke alarms in place.  Living environment; residence and Firearm Safety: {Rehab home environment / accessibility:30080::"no firearms","firearms stored safely"}. Seat Belt Safety/Bike Helmet: Wears seat belt.     Objective:     Vitals: There were no vitals taken for this visit.  There is no height or weight on file to calculate BMI.   Tobacco History  Smoking Status  . Never Smoker  Smokeless Tobacco  . Never Used     Counseling given: Not Answered   Past Medical History:  Diagnosis Date  . Allergic rhinitis 10/30/2016  . Anemia   . Breast cancer of upper-outer quadrant of left female breast (HCC) 11/08/2013   ER/PR+ Her2- Left IDC   . Chronic renal insufficiency   . Chronic rhinitis   . Colon polyp   . Diastolic dysfunction 07/10/2016  . DJD (degenerative joint disease)   . Full dentures   . GERD (gastroesophageal reflux disease)   . Hypertension   . Impaired glucose tolerance 07/18/2014  . Memory loss   . Morbid obesity (HCC)   . Vertigo   . Wears glasses    Past Surgical History:  Procedure Laterality Date  . ABDOMINAL HYSTERECTOMY    . BREAST LUMPECTOMY WITH NEEDLE LOCALIZATION AND AXILLARY SENTINEL LYMPH NODE BX Left 12/04/2013   Procedure: BREAST LUMPECTOMY WITH NEEDLE LOCALIZATION AND AXILLARY SENTINEL LYMPH NODE BX;  Surgeon: Kandis Cocking, MD;  Location: [facility];  Service: General;  Laterality: Left;  . CATARACT EXTRACTION  2009   rt  . COLONOSCOPY    . KNEE ARTHROSCOPY     both  .  TONSILLECTOMY    . TOTAL KNEE ARTHROPLASTY  2002   rt  . TOTAL KNEE ARTHROPLASTY  2003   left  . VESICOVAGINAL FISTULA CLOSURE W/ TAH  1980   Family History  Problem Relation Age of Onset  . Colon cancer Mother        in her 9's  . Stomach cancer Mother   . Heart disease Unknown        remote family  . Lung cancer Brother        was a smoker  . Heart attack Son 8   History  Sexual Activity  . Sexual activity: Not Currently    Outpatient Encounter Prescriptions as of 08/10/2017  Medication Sig  . amLODipine (NORVASC) 5 MG tablet Take 1 tablet (5 mg total) by mouth daily.  [name] aspirin 81 MG tablet Take 81 mg by mouth daily.    . Cholecalciferol (VITAMIN D3) 2000 UNITS TABS Take 1 tablet by mouth daily.   . famotidine (PEPCID) 20 MG tablet Take 20 mg by mouth at bedtime.  [name] glucosamine-chondroitin 500-400 MG tablet Take 1 tablet by mouth daily.   [name] HYDROcodone-acetaminophen (NORCO/VICODIN) 5-325 MG tablet Take 1 tablet by mouth every 6 (six) hours as needed for moderate pain.  [name] losartan-hydrochlorothiazide (HYZAAR) 100-25 MG tablet Take 1 tablet by mouth daily.  . meclizine (ANTIVERT) 12.5 MG tablet TAKE 1 TABLET THREE TIMES DAILY AS NEEDED  FOR  DIZZINESS  .  Multiple Vitamins-Minerals (CENTRUM SILVER PO) Take 1 tablet by mouth daily.    . pantoprazole (PROTONIX) 40 MG tablet Take 1 tablet (40 mg total) by mouth daily.  . Potassium Gluconate 550 (90 K) MG TABS Take 1 tablet by mouth daily.  . predniSONE (DELTASONE) 10 MG tablet 3 tabs by mouth per day for 3 days,2tabs per day for 3 days,1tab per day for 3 days  . Simethicone (GAS-X PO) Take by mouth. Take as directed   . tamoxifen (NOLVADEX) 20 MG tablet TAKE 1 TABLET EVERY DAY  . traMADol (ULTRAM) 50 MG tablet Take 1 tablet (50 mg total) by mouth every 6 (six) hours as needed. for pain  . triamcinolone (NASACORT AQ) 55 MCG/ACT AERO nasal inhaler Place 2 sprays into the nose daily.   No facility-administered encounter  medications on file as of 08/10/2017.     Activities of Daily Living No flowsheet data found.  Patient Care Team: Biagio Borg, MD as PCP - General (Internal Medicine) Magrinat, [name], MD as Consulting Physician (Oncology) [name], MD (Inactive) as Consulting Physician (Radiation Oncology) [name], MD as Consulting Physician (Obstetrics and Gynecology) Nanci Pina, FNP as Nurse Practitioner (Psychiatry) [name], MD as Consulting Physician (Radiology)    Assessment:    Physical assessment deferred to PCP.  Exercise Activities and Dietary recommendations   Diet (meal preparation, eat out, water intake, caffeinated beverages, dairy products, fruits and vegetables): {Desc; diets:16563}    Goals      Patient Stated   . patient (pt-stated)          Discussed Water aerobics in June;  Continue to learn and watch sodium intake      Fall Risk Fall Risk  02/04/2017 11/28/2015 03/15/2015 11/27/2014 02/20/2014  Falls in the past year? No No No No No   Depression Screen PHQ 2/9 Scores 02/04/2017 11/28/2015 03/15/2015 11/27/2014  PHQ - 2 Score 0 0 0 0  PHQ- 9 Score 1 - - -     Cognitive Function        Immunization History  Administered Date(s) Administered  . Influenza Split 08/07/2011, 10/13/2012  . Influenza Whole 07/27/2008, 08/14/2009, 07/01/2010  . Influenza, High Dose Seasonal PF 07/10/2016  . Influenza,inj,Quad PF,6+ Mos 08/28/2013, 07/18/2014, 07/26/2015  . Pneumococcal Conjugate-13 02/20/2014  . Pneumococcal Polysaccharide-23 10/26/2005   Screening Tests Health Maintenance  Topic Date Due  . TETANUS/TDAP  10/30/1956  . INFLUENZA VACCINE  05/26/2017  . DEXA SCAN  Completed  . PNA vac Low Risk Adult  Completed      Plan:    I have personally reviewed and noted the following in the patient's chart:   . Medical and social history . Use of alcohol, tobacco or illicit drugs  . Current medications and supplements . Functional  ability and status . Nutritional status . Physical activity . Advanced directives . List of other physicians . Vitals . Screenings to include cognitive, depression, and falls . Referrals and appointments  In addition, I have reviewed and discussed with patient certain preventive protocols, quality metrics, and best practice recommendations. A written personalized care plan for preventive services as well as general preventive health recommendations were provided to patient.     Michiel Cowboy, RN  08/09/2017

## 2017-08-09 NOTE — Progress Notes (Deleted)
Pre visit review using our clinic review tool, if applicable. No additional management support is needed unless otherwise documented below in the visit note. 

## 2017-08-09 NOTE — Telephone Encounter (Signed)
Ok for tramadol but I can only do in 1 month prescriptions due to the large number of pills for 3 mo

## 2017-08-09 NOTE — Telephone Encounter (Signed)
Pt called requesting a refill on traMADol (ULTRAM) 50 MG tablet to [facility].

## 2017-08-10 ENCOUNTER — Ambulatory Visit (INDEPENDENT_AMBULATORY_CARE_PROVIDER_SITE_OTHER): Payer: Medicare PPO | Admitting: General Practice

## 2017-08-10 ENCOUNTER — Ambulatory Visit: Payer: Medicare PPO

## 2017-08-10 DIAGNOSIS — Z23 Encounter for immunization: Secondary | ICD-10-CM

## 2017-08-10 NOTE — Telephone Encounter (Signed)
Faxed

## 2017-08-16 NOTE — Progress Notes (Signed)
Pre visit review using our clinic review tool, if applicable. No additional management support is needed unless otherwise documented below in the visit note. 

## 2017-08-16 NOTE — Progress Notes (Addendum)
Subjective:   [name] is a 81 y.o. female who presents for Medicare Annual (Subsequent) preventive examination.  Review of Systems:  No ROS.  Medicare Wellness Visit. Additional risk factors are reflected in the social history.  Cardiac Risk Factors include: advanced age (>61mn, >>66women);dyslipidemia;hypertension Sleep patterns: feels rested on waking, gets up 1-2 times nightly to void and sleeps 6-8 hours nightly.    Home Safety/Smoke Alarms: Feels safe in home. Smoke alarms in place.  Living environment; residence and Firearm Safety: 1-story house/ trailer, equipment: Cane, [place]: [address] Type: R[place]ervation officer, natureand [place] Type: Tub SSurveyor, quantity no firearms. Lives alone, no needs for DME, good support system Seat Belt Safety/Bike Helmet: Wears seat belt.     Objective:     Vitals: BP 122/62   Pulse 71   Resp 20   Ht 5' 3" (1.6 m)   Wt 165 lb (74.8 kg)   SpO2 100%   BMI 29.23 kg/m   Body mass index is 29.23 kg/m.   Tobacco History  Smoking Status  . Never Smoker  Smokeless Tobacco  . Never Used     Counseling given: Not Answered   Past Medical History:  Diagnosis Date  . Allergic rhinitis 10/30/2016  . Anemia   . [place] upper[place]outer [place]   ER/PR+ Her2- Left IDC   . Chronic renal insufficiency   . Chronic rhinitis   . Colon polyp   . Diastolic dysfunction 90/17/4944 . DJD (degenerative joint disease)   . Full dentures   . GERD (gastroesophageal reflux disease)   . Hypertension   . Impaired glucose tolerance 07/18/2014  . Memory loss   . Morbid obesity ([place]   . Vertigo   . Wears glasses    Past Surgical History:  Procedure Laterality Date  . ABDOMINAL HYSTERECTOMY    . BREAST LUMPECTOMY WITH NEEDLE LOCALIZATION AND AXILLARY SENTINEL LYMPH NODE BX Left 12/04/2013   Procedure: BREAST LUMPECTOMY WITH NEEDLE LOCALIZATION AND AXILLARY SENTINEL LYMPH NODE BX;  Surgeon:  DShann Medal MD;  [facility]  Service: General;  Laterality: Left;  . CATARACT EXTRACTION  2009   rt  . COLONOSCOPY    . KNEE ARTHROSCOPY     both  . TONSILLECTOMY    . TOTAL KNEE ARTHROPLASTY  2002   rt  . TOTAL KNEE ARTHROPLASTY  2003   left  . VESICOVAGINAL FISTULA CLOSURE W/ TAH  1980   Family History  Problem Relation Age of Onset  . Colon cancer Mother        in her 817's . Stomach cancer Mother   . Heart disease Unknown        remote family  . Lung cancer Brother        was a smoker  . Heart attack Son 541  History  Sexual Activity  . Sexual activity: Not Currently    Outpatient Encounter Prescriptions as of 08/17/2017  Medication Sig  . amLODipine (NORVASC) 5 MG tablet Take 1 tablet (5 mg total) by mouth daily.  .Marland Kitchenaspirin 81 MG tablet Take 81 mg by mouth daily.    . Cholecalciferol (VITAMIN D3) 2000 UNITS TABS Take 1 tablet by mouth daily.   . famotidine (PEPCID) 20 MG tablet Take 20 mg by mouth at bedtime.  .Marland Kitchenglucosamine-chondroitin 500-400 MG tablet Take 1 tablet by mouth daily.   .Marland KitchenHYDROcodone-acetaminophen (NORCO/VICODIN) 5-325 MG tablet   .  losartan-hydrochlorothiazide (HYZAAR) 100-25 MG tablet Take 1 tablet by mouth daily.  . meclizine (ANTIVERT) 12.5 MG tablet TAKE 1 TABLET THREE TIMES DAILY AS NEEDED  FOR  DIZZINESS  . Multiple Vitamins-Minerals (CENTRUM SILVER PO) Take 1 tablet by mouth daily.    . pantoprazole (PROTONIX) 40 MG tablet Take 1 tablet (40 mg total) by mouth daily.  . Potassium Gluconate 550 (90 K) MG TABS Take 1 tablet by mouth daily.  . predniSONE (DELTASONE) 10 MG tablet 3 tabs by mouth per day for 3 days,2tabs per day for 3 days,1tab per day for 3 days  . Simethicone (GAS-X PO) Take by mouth. Take as directed   . tamoxifen (NOLVADEX) 20 MG tablet Take 1 tablet (20 mg total) by mouth daily.  . traMADol (ULTRAM) 50 MG tablet Take 1 tablet (50 mg total) by mouth every 6 (six) hours as needed. for pain  .  triamcinolone (NASACORT AQ) 55 MCG/ACT AERO nasal inhaler Place 2 sprays into the nose daily.   No facility-administered encounter medications on file as of 08/17/2017.     Activities of Daily Living In your present state of health, do you have any difficulty performing the following activities: 08/17/2017  Hearing? Y  Vision? N  Difficulty concentrating or making decisions? N  Walking or climbing stairs? N  Dressing or bathing? N  Doing errands, shopping? N  Preparing Food and eating ? N  Using the Toilet? N  In the past six months, have you accidently leaked urine? N  Do you have problems with loss of bowel control? N  Managing your Medications? N  Managing your Finances? N  Housekeeping or managing your Housekeeping? N  Some recent data might be hidden    Patient Care Team: Biagio Borg, MD as PCP - General (Internal Medicine) Magrinat, [name], MD as Consulting Physician (Oncology) [name], MD (Inactive) as Consulting Physician (Radiation Oncology) [name], MD as Consulting Physician (Obstetrics and Gynecology) Nanci Pina, FNP as Nurse Practitioner (Psychiatry) [name], MD as Consulting Physician (Radiology)    Assessment:    Physical assessment deferred to PCP.  Exercise Activities and Dietary recommendations Current Exercise Habits: Structured exercise class (chair exercise pamphlets provided), Time (Minutes): 45, Frequency (Times/Week): 2, Weekly Exercise (Minutes/Week): 90, Intensity: Mild, Exercise limited by: orthopedic condition(s)  Diet (meal preparation, eat out, water intake, caffeinated beverages, dairy products, fruits and vegetables): in general, a "healthy" diet  , well balanced   Reviewed heart healthy diet, encouraged patient to increase daily water intake. Discussed weight loss tips, Diet education was provided via handout.      Goals      Patient Stated   . patient (pt-stated)          Discussed Water  aerobics in June;  Continue to learn and watch sodium intake      Other   . Maintain current health status          Stay as active and as independent as possible and get my hearing problem corrected      Fall Risk Fall Risk  08/17/2017 02/04/2017 11/28/2015 03/15/2015 11/27/2014  Falls in the past year? _0    Depression Screen PHQ 2/9 Scores 08/17/2017 02/04/2017 11/28/2015 03/15/2015  PHQ - 2 Score 1 0 0 0  PHQ- 9 Score 2 1 - -     Cognitive Function MMSE - Mini Mental State Exam 08/17/2017  Orientation to time 5  Orientation to Place 5  Registration  3  Attention/ Calculation 3  Recall 2  Language- name 2 objects 2  Language- repeat 1  Language- follow 3 step command 3  Language- read & follow direction 1  Write a sentence 1  Copy design 1  Total score 27        Immunization History  Administered Date(s) Administered  . Influenza Split 08/07/2011, 10/13/2012  . Influenza Whole 07/27/2008, 08/14/2009, 07/01/2010  . Influenza, High Dose Seasonal PF 07/10/2016, 08/10/2017  . Influenza,inj,Quad PF,6+ Mos 08/28/2013, 07/18/2014, 07/26/2015  . Pneumococcal Conjugate-13 02/20/2014  . Pneumococcal Polysaccharide-23 10/26/2005   Screening Tests Health Maintenance  Topic Date Due  . TETANUS/TDAP  10/30/1954  . INFLUENZA VACCINE  Completed  . DEXA SCAN  Completed  . PNA vac Low Risk Adult  Completed      Plan:    Continue to eat heart healthy diet (full of fruits, vegetables, whole grains, lean protein, water--limit salt, fat, and sugar intake) and increase physical activity as tolerated.  Continue doing brain stimulating activities (puzzles, reading, adult coloring books, staying active) to keep memory sharp.   I have personally reviewed and noted the following in the patient's chart:   . Medical and social history . Use of alcohol, tobacco or illicit drugs  . Current medications and supplements . Functional ability and status . Nutritional  status . Physical activity . Advanced directives . List of other physicians . Vitals . Screenings to include cognitive, depression, and falls . Referrals and appointments  In addition, I have reviewed and discussed with patient certain preventive protocols, quality metrics, and best practice recommendations. A written personalized care plan for preventive services as well as general preventive health recommendations were provided to patient.     Michiel Cowboy, RN  08/17/2017   Medical screening examination/treatment/procedure(s) were performed by non-physician practitioner and as supervising physician I was immediately available for consultation/collaboration. I agree with above. Binnie Rail, MD

## 2017-08-17 ENCOUNTER — Ambulatory Visit (INDEPENDENT_AMBULATORY_CARE_PROVIDER_SITE_OTHER): Payer: Medicare PPO | Admitting: *Deleted

## 2017-08-17 VITALS — BP 122/62 | HR 71 | Resp 20 | Ht 63.0 in | Wt 165.0 lb

## 2017-08-17 DIAGNOSIS — Z0000 Encounter for general adult medical examination without abnormal findings: Secondary | ICD-10-CM | POA: Diagnosis not present

## 2017-08-17 NOTE — Patient Instructions (Addendum)
Teacher, music in [place], [place] Address: [address], [place], [place] [address] Phone: [phone]  [employer]: does hearing aid repaires 4.9 27 [employer] reviews Hearing aid store in [place], [place] Address: [address] [name],  [address] Phone: [phone]  [facility] has an audiology department that charges reasonably for their hearing devices.  Continue to eat heart healthy diet (full of fruits, vegetables, whole grains, lean protein, water--limit salt, fat, and sugar intake) and increase physical activity as tolerated.  Continue doing brain stimulating activities (puzzles, reading, adult coloring books, staying active) to keep memory sharp.    [name] , Thank you for taking time to come for your Medicare Wellness Visit. I appreciate your ongoing commitment to your health goals. Please review the following plan we discussed and let me know if I can assist you in the future.   These are the goals we discussed: Goals      Patient Stated   . patient (pt-stated)          Discussed Water aerobics in June;  Continue to learn and watch sodium intake      Other   . Maintain current health status          Stay as active and as independent as possible and get my hearing problem corrected       This is a list of the screening recommended for you and due dates:  Health Maintenance  Topic Date Due  . Tetanus Vaccine  10/30/1956  . Flu Shot  Completed  . DEXA scan (bone density measurement)  Completed  . Pneumonia vaccines  Completed

## 2017-08-27 ENCOUNTER — Ambulatory Visit: Payer: Medicare PPO | Admitting: Internal Medicine

## 2017-08-31 ENCOUNTER — Ambulatory Visit: Payer: Medicare PPO | Admitting: Internal Medicine

## 2017-08-31 ENCOUNTER — Encounter: Payer: Self-pay | Admitting: Internal Medicine

## 2017-08-31 VITALS — BP 126/72 | HR 64 | Temp 97.9°F | Ht 63.0 in | Wt 157.0 lb

## 2017-08-31 DIAGNOSIS — M5432 Sciatica, left side: Secondary | ICD-10-CM | POA: Diagnosis not present

## 2017-08-31 DIAGNOSIS — J309 Allergic rhinitis, unspecified: Secondary | ICD-10-CM

## 2017-08-31 DIAGNOSIS — J45909 Unspecified asthma, uncomplicated: Secondary | ICD-10-CM | POA: Insufficient documentation

## 2017-08-31 DIAGNOSIS — J45901 Unspecified asthma with (acute) exacerbation: Secondary | ICD-10-CM | POA: Insufficient documentation

## 2017-08-31 DIAGNOSIS — M069 Rheumatoid arthritis, unspecified: Secondary | ICD-10-CM

## 2017-08-31 DIAGNOSIS — J4521 Mild intermittent asthma with (acute) exacerbation: Secondary | ICD-10-CM

## 2017-08-31 MED ORDER — ALBUTEROL SULFATE HFA 108 (90 BASE) MCG/ACT IN AERS
2.0000 | INHALATION_SPRAY | Freq: Four times a day (QID) | RESPIRATORY_TRACT | 11 refills | Status: DC | PRN
Start: 2017-08-31 — End: 2018-05-30

## 2017-08-31 MED ORDER — METHYLPREDNISOLONE ACETATE 80 MG/ML IJ SUSP
80.0000 mg | Freq: Once | INTRAMUSCULAR | Status: AC
  Administered 2017-08-31: 80 mg via INTRAMUSCULAR

## 2017-08-31 MED ORDER — GABAPENTIN 100 MG PO CAPS: 100.0000 mg | ORAL_CAPSULE | Freq: Three times a day (TID) | ORAL | 3 refills | Status: DC

## 2017-08-31 MED ORDER — BUDESONIDE-FORMOTEROL FUMARATE 160-4.5 MCG/ACT IN AERO: 2.0000 | INHALATION_SPRAY | Freq: Two times a day (BID) | RESPIRATORY_TRACT | 11 refills | Status: DC

## 2017-08-31 MED ORDER — PREDNISONE 5 MG (21) PO TBPK: ORAL_TABLET | ORAL | Status: DC

## 2017-08-31 NOTE — Patient Instructions (Addendum)
You had the steroid shot today  Please take all new medication as prescribed - the symbicort every day, and the albuterol inhaler as needed, as well as the gabapentin 100 mg three times per day  Please call if the 100 mg is not enough in 1 week  Please continue all other medications as before, and refills have been done if requested.  Please have the pharmacy call with any other refills you may need.  Please continue your efforts at being more active, low cholesterol diet, and weight control.  Please keep your appointments with your specialists as you may have planned  Call if you change your mind about having the MRI for lower back

## 2017-08-31 NOTE — Progress Notes (Signed)
Subjective:    Patient ID: [name], female    DOB: 11/13/1935, 81 y.o.   MRN: [id number]  HPI:  Here to f/u with Does have several wks ongoing mild nasal allergy symptoms with clearish congestion, itch and sneezing, without fever, pain, ST, or swelling, but also this season it seems with worsening non prod cough and wheezing, sob, mild doe.  Pt states she has had good report on cardiac health after last card visit aug 22.  Also, tramadol not working well for pain, seeing rheum for RA with typical RA joint pain without swelling not controlled,  but also has mild to mod intermittent left thigh and hip pain with LLE and LLE radicular pain, for 3 mo, without tingling or worsening focal weakness.   Past Medical History:  Diagnosis Date  . Allergic rhinitis 10/30/2016  . Anemia   . Breast cancer of upper-outer quadrant of left female breast ([facility]) 11/08/2013   ER/PR+ Her2- Left IDC   . Chronic renal insufficiency   . Chronic rhinitis   . Colon polyp   . Diastolic dysfunction 4/40/3474  . DJD (degenerative joint disease)   . Full dentures   . GERD (gastroesophageal reflux disease)   . Hypertension   . Impaired glucose tolerance 07/18/2014  . Memory loss   . Morbid obesity ([place])   . Vertigo   . Wears glasses    Past Surgical History:  Procedure Laterality Date  . ABDOMINAL HYSTERECTOMY    . CATARACT EXTRACTION  2009   rt  . COLONOSCOPY    . KNEE ARTHROSCOPY     both  . TONSILLECTOMY    . TOTAL KNEE ARTHROPLASTY  2002   rt  . TOTAL KNEE ARTHROPLASTY  2003   left  . VESICOVAGINAL FISTULA CLOSURE W/ TAH  1980    reports that  has never smoked. she has never used smokeless tobacco. She reports that she does not drink alcohol or use drugs. family history includes Colon cancer in her mother; Heart attack (age of onset: 65) in her son; Heart disease in her unknown relative; Lung cancer in her brother; Stomach cancer in her mother. Allergies  Allergen Reactions  . Iron    REACTION: hives/whelps/bad constipation   Current Outpatient Medications on File Prior to Visit  Medication Sig Dispense Refill  . amLODipine (NORVASC) 5 MG tablet Take 1 tablet (5 mg total) by mouth daily. 90 tablet 1  . aspirin 81 MG tablet Take 81 mg by mouth daily.      . Cholecalciferol (VITAMIN D3) 2000 UNITS TABS Take 1 tablet by mouth daily.     . famotidine (PEPCID) 20 MG tablet Take 20 mg by mouth at bedtime.    [name] glucosamine-chondroitin 500-400 MG tablet Take 1 tablet by mouth daily.     [name] HYDROcodone-acetaminophen (NORCO/VICODIN) 5-325 MG tablet     . losartan-hydrochlorothiazide (HYZAAR) 100-25 MG tablet Take 1 tablet by mouth daily. 10 tablet 0  . meclizine (ANTIVERT) 12.5 MG tablet TAKE 1 TABLET THREE TIMES DAILY AS NEEDED  FOR  DIZZINESS 270 tablet 0  . Multiple Vitamins-Minerals (CENTRUM SILVER PO) Take 1 tablet by mouth daily.      . pantoprazole (PROTONIX) 40 MG tablet Take 1 tablet (40 mg total) by mouth daily. 90 tablet 2  . Potassium Gluconate 550 (90 K) MG TABS Take 1 tablet by mouth daily.    . Simethicone (GAS-X PO) Take by mouth. Take as directed     . tamoxifen (NOLVADEX)  20 MG tablet Take 1 tablet (20 mg total) by mouth daily. 90 tablet 3  . traMADol (ULTRAM) 50 MG tablet Take 1 tablet (50 mg total) by mouth every 6 (six) hours as needed. for pain 120 tablet 2  . triamcinolone (NASACORT AQ) 55 MCG/ACT AERO nasal inhaler Place 2 sprays into the nose daily. 1 Inhaler 12   No current facility-administered medications on file prior to visit.    Review of Systems  Constitutional: Negative for other unusual diaphoresis or sweats HENT: Negative for ear discharge or swelling Eyes: Negative for other worsening visual disturbances Respiratory: Negative for stridor or other swelling  Gastrointestinal: Negative for worsening distension or other blood Genitourinary: Negative for retention or other urinary change Musculoskeletal: Negative for other MSK pain or  swelling Skin: Negative for color change or other new lesions Neurological: Negative for worsening tremors and other numbness  Psychiatric/Behavioral: Negative for worsening agitation or other fatigue All other system per pt    Objective:   Physical Exam BP 126/72   Pulse 64   Temp 97.9 F (36.6 C) (Oral)   Ht 5' 3" (1.6 m)   Wt 157 lb (71.2 kg)   SpO2 99%   BMI 27.81 kg/m  VS noted, non toxic Constitutional: Pt appears in NAD HENT: Head: NCAT.  Right Ear: External ear normal.  Left Ear: External ear normal.  Bilat tm's with mild erythema.  Max sinus areas mild tender.  Pharynx with mild erythema, no exudate Eyes: . Pupils are equal, round, and reactive to light. Conjunctivae and EOM are normal Nose: without d/c or deformity Neck: Neck supple. Gross normal ROM Cardiovascular: Normal rate and regular rhythm.   Pulmonary/Chest: Effort normal and breath sounds decreased bilat without rales, rhonchi or wheezing.  Abd:  Soft, NT, ND, + BS, no organomegaly Spine nontender in the midline or left lumbar paravertebral; no overt joint effusions Neurological: Pt is alert. At baseline orientation, motor 5/5 intact Skin: Skin is warm. No rashes, other new lesions, no LE edema Psychiatric: Pt behavior is normal without agitation  No other exam findings    Assessment & Plan:   

## 2017-09-04 DIAGNOSIS — M5430 Sciatica, unspecified side: Secondary | ICD-10-CM | POA: Insufficient documentation

## 2017-09-04 DIAGNOSIS — M5432 Sciatica, left side: Secondary | ICD-10-CM | POA: Insufficient documentation

## 2017-09-04 NOTE — Assessment & Plan Note (Signed)
Without neuro changes, but with recurring pain, for gabapentin asd, may be improved with steroid tx today?, declines MRI for now

## 2017-09-04 NOTE — Assessment & Plan Note (Signed)
Mild, for contd antihist use prn, should also improve with steroid tx today,  to f/u any worsening symptoms or concerns

## 2017-09-04 NOTE — Assessment & Plan Note (Signed)
No effusions but suspect mild RA flare, should also improve with steroid tx today,  to f/u any worsening symptoms or concerns

## 2017-09-04 NOTE — Assessment & Plan Note (Signed)
Mild to mod, for depomedrol IM 80, start albuterol MDI prn, and symbicort asd,  to f/u any worsening symptoms or concerns

## 2017-09-05 ENCOUNTER — Other Ambulatory Visit: Payer: Self-pay | Admitting: Internal Medicine

## 2017-09-13 ENCOUNTER — Telehealth: Payer: Self-pay | Admitting: Internal Medicine

## 2017-09-13 DIAGNOSIS — M25569 Pain in unspecified knee: Secondary | ICD-10-CM

## 2017-09-13 DIAGNOSIS — M5440 Lumbago with sciatica, unspecified side: Secondary | ICD-10-CM

## 2017-09-13 NOTE — Telephone Encounter (Signed)
Pt called and would like to receive a knee and back brace,  Has no preferences on where she gets it from Needs the Knee brace the most    Received a call from [facility] and her  meclizine (ANTIVERT) 12.5 MG tablet Needs a PA  Please advise on both

## 2017-09-13 NOTE — Telephone Encounter (Signed)
Pt called the [facility] stating that she has been trying to call our office and was not able to get through. I called her back and left a message.

## 2017-09-14 NOTE — Addendum Note (Signed)
Addended by: Biagio Borg on: 09/14/2017 09:16 AM   Modules accepted: Orders

## 2017-09-14 NOTE — Telephone Encounter (Signed)
I am not sure about the knee and back brace - I can refer to sports medicine however who can evaluate for this (done)  OK to let pt know the antivert for some reason is not covered by her insurance, and she may wish to try OTC dramamine instead

## 2017-09-14 NOTE — Telephone Encounter (Signed)
Informed pt and she expressed understanding.   She stated that she has has some facial swelling from the gabapentin. I offered her an appt with another provider since [facility] is full today and tomorrow, office closed Thursday and Friday. She declined and took an appt with [facility] on Tuesday the 22nd.

## 2017-09-14 NOTE — Telephone Encounter (Signed)
See other note

## 2017-09-21 ENCOUNTER — Ambulatory Visit: Payer: Medicare PPO | Admitting: Internal Medicine

## 2017-09-21 ENCOUNTER — Encounter: Payer: Self-pay | Admitting: Internal Medicine

## 2017-09-21 VITALS — BP 126/84 | HR 75 | Temp 97.8°F | Ht 63.0 in | Wt 161.0 lb

## 2017-09-21 DIAGNOSIS — N183 Chronic kidney disease, stage 3 unspecified: Secondary | ICD-10-CM

## 2017-09-21 DIAGNOSIS — R609 Edema, unspecified: Secondary | ICD-10-CM | POA: Diagnosis not present

## 2017-09-21 DIAGNOSIS — M545 Low back pain, unspecified: Secondary | ICD-10-CM

## 2017-09-21 DIAGNOSIS — R7302 Impaired glucose tolerance (oral): Secondary | ICD-10-CM

## 2017-09-21 DIAGNOSIS — I10 Essential (primary) hypertension: Secondary | ICD-10-CM | POA: Diagnosis not present

## 2017-09-21 MED ORDER — HYDROCODONE-ACETAMINOPHEN 5-325 MG PO TABS: 1.0000 | ORAL_TABLET | Freq: Four times a day (QID) | ORAL | 0 refills | Status: DC | PRN

## 2017-09-21 NOTE — Assessment & Plan Note (Signed)
C/w venous insuff and overall improved, cont compression stockings

## 2017-09-21 NOTE — Progress Notes (Signed)
Subjective:    Patient ID: [name], female    DOB: September 27, 1936, 81 y.o.   MRN: [id number]  HPI  Here with daughter to f/u, c/o sinus area facial swelling with mild discomfort and congestion, but no fever, d/c, HA, ST, cough and Pt denies chest pain, increased sob or doe, wheezing, orthopnea, PND, increased LE swelling, palpitations, dizziness or syncope.  In fact has improved LE swelling overall though some venous insufficiency persists. Pt continues to have recurring LBP with change in severity now mod to severe daily > 3 mo, without bowel or bladder change, fever, wt loss,  worsening LE numbness/weakness, gait change or falls, but has worsening bilat buttock and upper leg pain to knees.  Gabapentin did not help. Wt Readings from Last 3 Encounters:  09/21/17 161 lb (73 kg)  08/31/17 157 lb (71.2 kg)  08/17/17 165 lb (74.8 kg)  Remains on prednisone - wants to stop, has taken for several yrs, has f/u appt with rheum later this week  [employer] is going to stop coverage of the antivert next yr. Denies worsening reflux, abd pain, dysphagia, n/v, bowel change or blood. Past Medical History:  Diagnosis Date  . Allergic rhinitis 10/30/2016  . Anemia   . Breast cancer of upper-outer quadrant of left female breast ([facility]) 11/08/2013   ER/PR+ Her2- Left IDC   . Chronic renal insufficiency   . Chronic rhinitis   . Colon polyp   . Diastolic dysfunction 02/01/8118  . DJD (degenerative joint disease)   . Full dentures   . GERD (gastroesophageal reflux disease)   . Hypertension   . Impaired glucose tolerance 07/18/2014  . Memory loss   . Morbid obesity ([facility])   . Vertigo   . Wears glasses    Past Surgical History:  Procedure Laterality Date  . ABDOMINAL HYSTERECTOMY    . BREAST LUMPECTOMY WITH NEEDLE LOCALIZATION AND AXILLARY SENTINEL LYMPH NODE BX Left 12/04/2013   Procedure: BREAST LUMPECTOMY WITH NEEDLE LOCALIZATION AND AXILLARY SENTINEL LYMPH NODE BX;  Surgeon: Shann Medal, MD;  Location:  [facility];  Service: General;  Laterality: Left;  . CATARACT EXTRACTION  2009   rt  . COLONOSCOPY    . KNEE ARTHROSCOPY     both  . TONSILLECTOMY    . TOTAL KNEE ARTHROPLASTY  2002   rt  . TOTAL KNEE ARTHROPLASTY  2003   left  . VESICOVAGINAL FISTULA CLOSURE W/ TAH  1980    reports that  has never smoked. she has never used smokeless tobacco. She reports that she does not drink alcohol or use drugs. family history includes Colon cancer in her mother; Heart attack (age of onset: 61) in her son; Heart disease in her unknown relative; Lung cancer in her brother; Stomach cancer in her mother. Allergies  Allergen Reactions  . Iron     REACTION: hives/whelps/bad constipation   Current Outpatient Medications on File Prior to Visit  Medication Sig Dispense Refill  . albuterol (PROVENTIL HFA;VENTOLIN HFA) 108 (90 Base) MCG/ACT inhaler Inhale 2 puffs every 6 (six) hours as needed into the lungs for wheezing or shortness of breath. 1 Inhaler 11  . amLODipine (NORVASC) 5 MG tablet Take 1 tablet (5 mg total) by mouth daily. 90 tablet 1  . aspirin 81 MG tablet Take 81 mg by mouth daily.      . budesonide-formoterol (SYMBICORT) 160-4.5 MCG/ACT inhaler Inhale 2 puffs 2 (two) times daily into the lungs. 1 Inhaler 11  . Cholecalciferol (  VITAMIN D3) 2000 UNITS TABS Take 1 tablet by mouth daily.     [name] glucosamine-chondroitin 500-400 MG tablet Take 1 tablet by mouth daily.     [name] losartan-hydrochlorothiazide (HYZAAR) 100-25 MG tablet Take 1 tablet by mouth daily. 10 tablet 0  . meclizine (ANTIVERT) 12.5 MG tablet TAKE 1 TABLET THREE TIMES DAILY AS NEEDED  FOR  DIZZINESS 270 tablet 0  . Multiple Vitamins-Minerals (CENTRUM SILVER PO) Take 1 tablet by mouth daily.      . pantoprazole (PROTONIX) 40 MG tablet Take 1 tablet (40 mg total) by mouth daily. 90 tablet 2  . Potassium Gluconate 550 (90 K) MG TABS Take 1 tablet by mouth daily.    . Simethicone (GAS-X PO) Take by mouth. Take as directed      . tamoxifen (NOLVADEX) 20 MG tablet Take 1 tablet (20 mg total) by mouth daily. 90 tablet 3  . traMADol (ULTRAM) 50 MG tablet Take 1 tablet (50 mg total) by mouth every 6 (six) hours as needed. for pain 120 tablet 2  . triamcinolone (NASACORT AQ) 55 MCG/ACT AERO nasal inhaler Place 2 sprays into the nose daily. 1 Inhaler 12   No current facility-administered medications on file prior to visit.    Review of Systems  Constitutional: Negative for other unusual diaphoresis or sweats HENT: Negative for ear discharge or swelling Eyes: Negative for other worsening visual disturbances Respiratory: Negative for stridor or other swelling  Gastrointestinal: Negative for worsening distension or other blood Genitourinary: Negative for retention or other urinary change Musculoskeletal: Negative for other MSK pain or swelling Skin: Negative for color change or other new lesions Neurological: Negative for worsening tremors and other numbness  Psychiatric/Behavioral: Negative for worsening agitation or other fatigue All other system neg per pt    Objective:   Physical Exam BP 126/84   Pulse 75   Temp 97.8 F (36.6 C) (Oral)   Ht '5\' 3"'$  (1.6 m)   Wt 161 lb (73 kg)   SpO2 98%   BMI 28.52 kg/m  VS noted,  Constitutional: Pt appears in NAD HENT: Head: NCAT.  Right Ear: External ear normal.  Left Ear: External ear normal.  Bilat tm's with mild erythema.  Max sinus areas non tender.  Pharynx with mild erythema, no exudate, has some nondiscrete swelling to bilat maxillary sinus areas of face Eyes: . Pupils are equal, round, and reactive to light. Conjunctivae and EOM are normal Nose: without d/c or deformity Neck: Neck supple. Gross normal ROM Cardiovascular: Normal rate and regular rhythm.   Pulmonary/Chest: Effort normal and breath sounds without rales or wheezing.  Abd:  Soft, NT, ND, + BS, no organomegaly Spine: nontender in midline or paravertebral Neurological: Pt is alert. At baseline  orientation, motor 5/5 intact Skin: Skin is warm. No rashes, other new lesions, trace to 1+ bilat LE edema Psychiatric: Pt behavior is normal without agitation  No other exam findings  Lab Results  Component Value Date   WBC 6.3 07/27/2016   HGB 10.2 (L) 07/27/2016   HCT 31.3 (L) 07/27/2016   PLT 200 07/27/2016   GLUCOSE 136 07/27/2016   CHOL 152 06/02/2016   TRIG 117.0 06/02/2016   HDL 52.50 06/02/2016   LDLCALC 76 06/02/2016   ALT 18 07/27/2016   AST 23 07/27/2016   NA 138 07/27/2016   K 4.4 07/27/2016   CL 100 06/02/2016   CREATININE 1.4 (H) 07/27/2016   BUN 24.7 07/27/2016   CO2 25 07/27/2016   TSH 1.15  11/25/2015   HGBA1C 5.4 06/02/2016       Assessment & Plan:

## 2017-09-21 NOTE — Assessment & Plan Note (Signed)
Asympt,  to f/u any worsening symptoms or concerns 

## 2017-09-21 NOTE — Patient Instructions (Addendum)
OK to stop the pepcid  Please take all new medication as prescribed - the hydrocodone for pain as needed  You will be contacted regarding the referral for: MRI lower back  Depending on the results, we may need to have you see Dr Lannette Donath  Please continue all other medications as before, and refills have been done if requested.  Please have the pharmacy call with any other refills you may need.  Please keep your appointments with your specialists as you may have planned - Rheumatology later this week

## 2017-09-21 NOTE — Assessment & Plan Note (Signed)
stable overall by history and exam, recent data reviewed with pt, and pt to continue medical treatment as before,  to f/u any worsening symptoms or concerns, for f/u lab today 

## 2017-09-21 NOTE — Assessment & Plan Note (Signed)
stable overall by history and exam, recent data reviewed with pt, and pt to continue medical treatment as before,  to f/u any worsening symptoms or concerns BP Readings from Last 3 Encounters:  09/21/17 126/84  08/31/17 126/72  08/17/17 122/62

## 2017-09-21 NOTE — Assessment & Plan Note (Signed)
Without neuro change but with 3 mo worsening severity causing higher risk of fall, also with hx of chronic steroid use for yrs, no prior dx of spine fracture, now walks with cane, for LS spine MRI, pain control, d/c gabapentin as did not help

## 2017-10-01 ENCOUNTER — Telehealth: Payer: Self-pay | Admitting: Oncology

## 2017-10-01 ENCOUNTER — Telehealth: Payer: Self-pay | Admitting: Internal Medicine

## 2017-10-01 NOTE — Telephone Encounter (Signed)
Copied from [place] [id number]. Topic: Quick Communication - Rx Refill/Question >> Oct 01, 2017 10:26 AM Robina Ade, Helene Kelp D wrote: Has the patient contacted their pharmacy? Yes (Agent: If no, request that the patient contact the pharmacy for the refill.) Preferred Pharmacy (with phone number or street name): [place], [place]: Please be advised that RX refills may take up to 3 business days. We ask that you follow-up with your pharmacy. Patient needs refill on her pantoprazole (PROTONIX) 40 MG tablet.

## 2017-10-01 NOTE — Telephone Encounter (Signed)
Patient called in to reschedule  °

## 2017-10-05 ENCOUNTER — Ambulatory Visit: Payer: Commercial Managed Care - HMO | Admitting: Oncology

## 2017-10-07 ENCOUNTER — Telehealth: Payer: Self-pay | Admitting: Internal Medicine

## 2017-10-07 MED ORDER — PANTOPRAZOLE SODIUM 40 MG PO TBEC: 40.0000 mg | DELAYED_RELEASE_TABLET | Freq: Every day | ORAL | 2 refills | Status: DC

## 2017-10-07 NOTE — Telephone Encounter (Signed)
Copied from [place] #[id number]. Topic: Inquiry >> Oct 07, 2017  3:55 PM Cecelia Byars, NT wrote: Reason for [id number] called to follow up concerning refill to [facility] for pantoprazole 40 mg request was faxed recently from  [phone] please call patient at [phone] to update her on status

## 2017-10-08 ENCOUNTER — Telehealth: Payer: Self-pay | Admitting: Internal Medicine

## 2017-10-08 NOTE — Telephone Encounter (Signed)
Copied from [place]. Topic: Quick Communication - See Telephone Encounter >> Oct 08, 2017 10:30 AM Cleaster Corin, NT wrote: CRM for notification. See Telephone encounter for:   10/08/17. Butch Penny with [facility] will be sending a fax over for pt. Needed to be signed by doctor about care and medication  Butch Penny can be reached at [phone]

## 2017-10-13 ENCOUNTER — Ambulatory Visit: Payer: Medicare PPO | Admitting: Family Medicine

## 2017-10-18 ENCOUNTER — Other Ambulatory Visit: Payer: Self-pay | Admitting: Internal Medicine

## 2017-10-18 NOTE — Telephone Encounter (Signed)
Copied from [place] [id number]. Topic: Quick Communication - See Telephone Encounter >> Oct 18, 2017 12:08 PM Corie Chiquito, [place] wrote: CRM for notification. See Telephone encounter for: Patient calling because she needs a refill on her Gabapentin and Hydrocodone. Patient is going to contact her pharmacy as well about the medications. If someone could give her a call back at [phone]  10/18/17.

## 2017-10-18 NOTE — Telephone Encounter (Signed)
LOV 09/21/17 with Dr. Jenny Reichmann / See refill request for Gabapentin and Norco

## 2017-10-20 NOTE — Telephone Encounter (Signed)
Unfortunately, I cannot refill the hydrocodone due to pt now apparenly wanting to take on regular basis for chronic pain, but did not follow through with MRI  We should not promote narcotic dependency, especially without a diagnosis  I dont see where the gabapentin was prescribed on her current med list as well

## 2017-10-21 ENCOUNTER — Encounter: Payer: Self-pay | Admitting: Internal Medicine

## 2017-10-21 ENCOUNTER — Telehealth: Payer: Self-pay

## 2017-10-21 MED ORDER — GABAPENTIN 100 MG PO CAPS: 100.0000 mg | ORAL_CAPSULE | Freq: Three times a day (TID) | ORAL | 3 refills | Status: DC

## 2017-10-21 MED ORDER — HYDROCODONE-ACETAMINOPHEN 5-325 MG PO TABS: 1.0000 | ORAL_TABLET | Freq: Four times a day (QID) | ORAL | 0 refills | Status: DC | PRN

## 2017-10-21 NOTE — Telephone Encounter (Signed)
[facility] for hydrocodone and gabapentin  - done erx, but will not be able to do further without a diagnosis

## 2017-10-21 NOTE — Telephone Encounter (Signed)
Notified pt w/MD response. Pt states she will call [employer] back and get MRI sometime next month...Johny Chess

## 2017-10-21 NOTE — Telephone Encounter (Signed)
Notified pt w/MD response. Pt states the reason why the MRI has not been done is because she had to change her insurance and she is bot able to do it until January. Also she states as for the Gabapentin MD was rx by Dr. Jenny Reichmann back in November which she has been taking everyday and that's why she is out. She was never told to stop taking the Gabapentin. She states she inform Dr. Jenny Reichmann that the Tramadol does nothing for her not the Gabapentin. She is concern about having a lot of pain meds being rx to her, and she states he never inform her to stop taking any of them, and her medications list is never updated. Inform pt she can make f/u appt to discuss her concerns which she made appt for 10/27/17, but she said she is needing something for her pain...Johny Chess

## 2017-10-21 NOTE — Telephone Encounter (Signed)
Spoke with patient, she has an appt coming up next week to come in to discuss meds. She would like clarification on what pain meds she should or shouldn't to be taking.     Copied from [place]. Topic: General - Other >> Oct 21, 2017  9:20 AM Yvette Rack wrote: Reason for CRM: patient states that Dr Judi Cong nurse called her and spoke with her about Gabapentin 100mg  caps quanity 90 expires 08-31-18 discard after 11-7- 2019 she was on gabapentin on 08-31-17 but was taken off of medicine 09-21-17

## 2017-10-21 NOTE — Addendum Note (Signed)
Addended by: Biagio Borg on: 10/21/2017 12:32 PM   Modules accepted: Orders

## 2017-10-25 ENCOUNTER — Telehealth: Payer: Self-pay | Admitting: Internal Medicine

## 2017-10-25 NOTE — Telephone Encounter (Signed)
Copied from [place] [id number]. Topic: Quick Communication - See Telephone Encounter >> Oct 25, 2017 10:59 AM Ahmed Prima L wrote: CRM for notification. See Telephone encounter for:   10/25/17. Pt states she is having some swelling in her legs/feet, went to the pharmacy and they told her to contact the dr and see if they could call her in something. She states she takes gabapentin & that she had ran out and was out of it three days. She said she knows that its probably coming from that. She picked up the medicine yesterday. She states that it may cause some swelling in the legs and feet, shes been dealing with it for 2 weeks. Pharmacy is [facility] [address]. Call back is [phone]. Tried to connect to a [place] and line was busy & she said she would like a call back.

## 2017-10-27 ENCOUNTER — Ambulatory Visit: Payer: Medicare PPO | Admitting: Internal Medicine

## 2017-10-27 NOTE — Telephone Encounter (Signed)
Please schedule ROV for patients leg swelling.

## 2017-10-27 NOTE — Telephone Encounter (Signed)
FYI

## 2017-10-27 NOTE — Telephone Encounter (Signed)
Leg swelling is not normally seen at all with gabapentin  Please make ROV as this is a new problem, and we would have to consider the reason why, which could be any of many things such as venous insufficiency, knee arthritis, lung problem with asthma, or CHF

## 2017-10-27 NOTE — Telephone Encounter (Signed)
Pt called, no answer, left message to call back regarding her symptoms.

## 2017-10-27 NOTE — Telephone Encounter (Signed)
Called patient to schedule an appointment with Dr Jenny Reichmann. [name] said that [name] already has one with Dr Tamala Julian on the 11th and will wait to see what he says about it. I told her I was not sure if this was something he could help her with. [name] said that was fine and if not, [name] will schedule to see Dr Jenny Reichmann at that time. [name] could not afford to pay so many copays.

## 2017-10-29 ENCOUNTER — Telehealth: Payer: Self-pay | Admitting: Internal Medicine

## 2017-10-29 DIAGNOSIS — G8929 Other chronic pain: Secondary | ICD-10-CM

## 2017-10-29 DIAGNOSIS — M545 Low back pain, unspecified: Secondary | ICD-10-CM

## 2017-10-29 NOTE — Addendum Note (Signed)
Addended by: Biagio Borg on: 10/29/2017 12:31 PM   Modules accepted: Orders

## 2017-10-29 NOTE — Telephone Encounter (Signed)
Left msg for pt call back with new insurance information

## 2017-10-29 NOTE — Telephone Encounter (Signed)
MRI is reordered  I would not normally recommend the CBD supplement as this is not currently an FDA approved medication

## 2017-10-29 NOTE — Telephone Encounter (Signed)
Pt has been informed and expressed understanding.  

## 2017-10-29 NOTE — Telephone Encounter (Signed)
She called in regarding rescheduling her MRI.   She cancelled the scheduled MRI because she was changing insurance plans.   Now she needs it rescheduled.  She is also wanting to know if she can take CBD oil with her current medications. Her daughter was telling her about it for pain.  I routed a note to Dr. Gwynn Burly nurse pool making them aware of her concerns.

## 2017-11-04 NOTE — Progress Notes (Signed)
[name] [address], [place] [address] Phone: [phone] Subjective:    I'm seeing this patient by the request  of:  Biagio Borg, MD   CC: Back pain and knee pain  [id number]  [name] is a 82 y.o. female coming in with complaint of back and bilateral knee pain. Left is worse than right. She has an MRI appointment for next month. Pain started in hips and thighs. She has had both knees replaced. Has not had any problems with her replacements until now.   Onset- May for knee back is chronic Location- lateral catching and lower back Duration- Worse in the morning Character- Sharp knee pain dull back pain Aggravating factors- back flexion Reliving factors- Moving around, topical cream   Therapies tried-  Severity-6 out of 10   Patient did have lumbar x-rays taken in May 2017.  These were independently visualized by me.  Showed some degenerative scoliosis with moderate osteoarthritic changes of the lumbar spine.  Past Medical History:  Diagnosis Date  . Allergic rhinitis 10/30/2016  . Anemia   . Breast cancer of upper-outer quadrant of left female breast ([facility]) 11/08/2013   ER/PR+ Her2- Left IDC   . Chronic renal insufficiency   . Chronic rhinitis   . Colon polyp   . Diastolic dysfunction 0/53/9767  . DJD (degenerative joint disease)   . Full dentures   . GERD (gastroesophageal reflux disease)   . Hypertension   . Impaired glucose tolerance 07/18/2014  . Memory loss   . Morbid obesity ([facility])   . Vertigo   . Wears glasses    Past Surgical History:  Procedure Laterality Date  . ABDOMINAL HYSTERECTOMY    . BREAST LUMPECTOMY WITH NEEDLE LOCALIZATION AND AXILLARY SENTINEL LYMPH NODE BX Left 12/04/2013   Procedure: BREAST LUMPECTOMY WITH NEEDLE LOCALIZATION AND AXILLARY SENTINEL LYMPH NODE BX;  Surgeon: Shann Medal, MD;  Location: [facility];  Service: General;  Laterality: Left;  . CATARACT EXTRACTION   2009   rt  . COLONOSCOPY    . KNEE ARTHROSCOPY     both  . TONSILLECTOMY    . TOTAL KNEE ARTHROPLASTY  2002   rt  . TOTAL KNEE ARTHROPLASTY  2003   left  . VESICOVAGINAL FISTULA CLOSURE W/ TAH  1980   Social History   Socioeconomic History  . Marital status: Widowed    Spouse name: None  . Number of children: 2  . Years of education: None  . Highest education level: None  Social Needs  . Financial resource strain: None  . Food insecurity - worry: None  . Food insecurity - inability: None  . Transportation needs - medical: None  . Transportation needs - non-medical: None  Occupational History  . Occupation: owns Teacher, adult education and works PT for [employer]  Tobacco Use  . Smoking status: Never Smoker  . Smokeless tobacco: Never Used  Substance and Sexual Activity  . Alcohol use: No    Alcohol/week: 0.0 oz  . Drug use: No  . Sexual activity: Not Currently  Other Topics Concern  . None  Social History Narrative  . None   Allergies  Allergen Reactions  . Iron     REACTION: hives/whelps/bad constipation   Family History  Problem Relation Age of Onset  . Colon cancer Mother        in her 53's  . Stomach cancer Mother   . Heart disease Unknown  remote family  . Lung cancer Brother        was a smoker  . Heart attack Son 44     Past medical history, social, surgical and family history all reviewed in electronic medical record.  No pertanent information unless stated regarding to the chief complaint.   Review of Systems:Review of systems updated and as accurate as of 11/05/17  No headache, visual changes, nausea, vomiting, diarrhea, constipation, dizziness, abdominal pain, skin rash, fevers, chills, night sweats, weight loss, swollen lymph nodes, s, chest pain, shortness of breath, mood changes.  Positive body aches, muscle aches, joint swelling  Objective  Blood pressure 138/80, pulse 74, height _0  (1.626 m), weight 164 lb (74.4 kg), SpO2 98 %. Systems  examined below as of 11/05/17   General: No apparent distress alert and oriented x3 mood and affect normal, dressed appropriately.  HEENT: Pupils equal, extraocular movements intact  Respiratory: Patient's speak in full sentences and does not appear short of breath  Cardiovascular: No lower extremity edema, non tender, no erythema  Skin: Warm dry intact with no signs of infection or rash on extremities or on axial skeleton.  Abdomen: Soft nontender  Neuro: Cranial nerves II through XII are intact, neurovascularly intact in all extremities with  and 2+ pulses.  Lymph: No lymphadenopathy of posterior or anterior cervical chain or axillae bilaterally.  Gait antalgic walking with an aid of a cane MSK:  tender with limited range of motion and good stability and symmetric strength and tone of shoulders, elbows, wrist, hip, knee and ankles bilaterally.  Swelling and erythema noted of the MCP joints of both hands bilateral knee replacements noted Significant atrophy of the thighs bilaterally. Back Exam:  Inspection: Degenerative scoliosis noted Motion: Flexion 35 deg, Extension 25 deg, Side Bending to 35 deg bilaterally,  Rotation to 35 deg bilaterally  SLR laying: Significant tightness bilaterally. XSLR laying: Negative  Palpable tenderness: Tender to palpation of the paraspinal musculature. FABER: Unable to do secondary to pain. Sensory change: Gross sensation intact to all lumbar and sacral dermatomes.  Reflexes of Achilles 3+ bilaterally Babinski's downgoing.  Strength at foot  4 out of 5 bilaterally. Patient actually has 3+ deep tendon reflexes and 2 beats of clonus in the legs symmetrically.    Impression and Recommendations:     This case required medical decision making of moderate complexity.      Note: This dictation was prepared with Dragon dictation along with smaller phrase technology. Any transcriptional errors that result from this process are unintentional.

## 2017-11-05 ENCOUNTER — Ambulatory Visit: Payer: Medicare HMO | Admitting: Family Medicine

## 2017-11-05 ENCOUNTER — Telehealth: Payer: Self-pay | Admitting: Oncology

## 2017-11-05 ENCOUNTER — Encounter: Payer: Self-pay | Admitting: Family Medicine

## 2017-11-05 DIAGNOSIS — M5136 Other intervertebral disc degeneration, lumbar region: Secondary | ICD-10-CM | POA: Diagnosis not present

## 2017-11-05 MED ORDER — VITAMIN D (ERGOCALCIFEROL) 1.25 MG (50000 UNIT) PO CAPS
50000.0000 [IU] | ORAL_CAPSULE | ORAL | 0 refills | Status: DC
Start: 2017-11-05 — End: 2019-05-16

## 2017-11-05 NOTE — Patient Instructions (Addendum)
Good to meet you  Decrease gabapentin to 100mg  only at night Once weely vitamin D for next 12 weeks, stop your daily  pennsaid pinkie amount topically 2 times daily as needed.   Turmeric 500mg  daily  Tart cherry extract any dose at night Ice 20 minutes 2 times daily. Usually after activity and before bed. See me again after the MRI and we will discuss next steps

## 2017-11-05 NOTE — Telephone Encounter (Signed)
Patient scheduled per 1/10 sch message

## 2017-11-05 NOTE — Assessment & Plan Note (Signed)
Patient on x-rays do have significant degenerative disc disease.  Patient's symptoms correspond with the possibility for spinal stenosis.  Patient does have increased deep tendon reflexes are 3+, atrophy of the thighs bilaterally as well as weakness of the lower extremities.  Patient is having more and more weakness and is unable to walk even 200 feet.  Difficulty with daily activities as well.  I do believe that patient could be a candidate for possible epidural injections.  Patient has many other comorbidities that could be contributing.  We discussed gabapentin and will do only nighttime dosing patient is scheduled for an MRI which would allow [place] to potentially do the injections in the near future.  Patient will follow-up after the MRI and we will discuss further.

## 2017-11-08 ENCOUNTER — Ambulatory Visit: Payer: Medicare PPO | Admitting: Oncology

## 2017-11-15 ENCOUNTER — Telehealth: Payer: Self-pay | Admitting: Internal Medicine

## 2017-11-15 MED ORDER — LOSARTAN POTASSIUM-HCTZ 100-25 MG PO TABS: 1.0000 | ORAL_TABLET | Freq: Every day | ORAL | 1 refills | Status: DC

## 2017-11-15 NOTE — Telephone Encounter (Signed)
Copied from [place] [id number]. Topic: Quick Communication - Rx Refill/Question >> Nov 15, 2017 12:30 PM Cleaster Corin, NT wrote: Medication: losartan hydrochlorothiazide    Has the patient contacted their pharmacy? yes   (Agent: If no, request that the patient contact the pharmacy for the refill.)   Preferred Pharmacy (with phone number or street name):[facility] [place], [place] [address] [place] [address] Phone: [phone] Fax: [phone]     Agent: Please be advised that RX refills may take up to 3 business days. We ask that you follow-up with your pharmacy.

## 2017-11-22 ENCOUNTER — Encounter: Payer: Self-pay | Admitting: Internal Medicine

## 2017-11-22 ENCOUNTER — Ambulatory Visit (INDEPENDENT_AMBULATORY_CARE_PROVIDER_SITE_OTHER): Payer: Medicare HMO | Admitting: Internal Medicine

## 2017-11-22 VITALS — BP 134/86 | HR 88 | Temp 98.1°F | Ht 64.0 in | Wt 164.0 lb

## 2017-11-22 DIAGNOSIS — L299 Pruritus, unspecified: Secondary | ICD-10-CM | POA: Insufficient documentation

## 2017-11-22 DIAGNOSIS — I10 Essential (primary) hypertension: Secondary | ICD-10-CM

## 2017-11-22 DIAGNOSIS — R609 Edema, unspecified: Secondary | ICD-10-CM

## 2017-11-22 DIAGNOSIS — R350 Frequency of micturition: Secondary | ICD-10-CM | POA: Diagnosis not present

## 2017-11-22 DIAGNOSIS — Z23 Encounter for immunization: Secondary | ICD-10-CM | POA: Diagnosis not present

## 2017-11-22 MED ORDER — TRIAMCINOLONE ACETONIDE 0.1 % EX CREA: 1.0000 "application " | TOPICAL_CREAM | Freq: Two times a day (BID) | CUTANEOUS | 0 refills | Status: DC

## 2017-11-22 MED ORDER — SOLIFENACIN SUCCINATE 5 MG PO TABS: 5.0000 mg | ORAL_TABLET | Freq: Every day | ORAL | 3 refills | Status: DC

## 2017-11-22 MED ORDER — METHYLPREDNISOLONE ACETATE 80 MG/ML IJ SUSP
80.0000 mg | Freq: Once | INTRAMUSCULAR | Status: AC
  Administered 2017-11-22: 80 mg via INTRAMUSCULAR

## 2017-11-22 MED ORDER — ZOSTER VAC RECOMB ADJUVANTED 50 MCG/0.5ML IM SUSR: 0.5000 mL | Freq: Once | INTRAMUSCULAR | 1 refills | Status: AC

## 2017-11-22 MED ORDER — FUROSEMIDE 20 MG PO TABS: 20.0000 mg | ORAL_TABLET | Freq: Every day | ORAL | 3 refills | Status: DC | PRN

## 2017-11-22 MED ORDER — LOSARTAN POTASSIUM-HCTZ 100-25 MG PO TABS: 1.0000 | ORAL_TABLET | Freq: Every day | ORAL | 1 refills | Status: DC

## 2017-11-22 NOTE — Assessment & Plan Note (Signed)
With nocturia, most c/w OAB  For vesicare 5 qd pending insuranfe approval

## 2017-11-22 NOTE — Patient Instructions (Addendum)
You had the Tdap tetanus shot today, and the steroid shot  Your shingles shot prescriptoin was sent to the pharmacy  OK to stop the gabapentin and vicodin  OK to take the Benadryl 50 mg at bedtime for itching  Please take all new medication as prescribed - the mild steroid cream, the lasix 20 mg daily only if needed, and the Vesicare (and have the pharmacy call with a similar mediction if this is not covered with your insurance)  Please continue all other medications as before, and refills have been done if requested.  Please have the pharmacy call with any other refills you may need.  Please continue your efforts at being more active, low cholesterol diet, and weight control.  Please keep your appointments with your specialists as you may have planned  Please return in 2 weeks, or sooner if needed

## 2017-11-22 NOTE — Assessment & Plan Note (Signed)
C/w venous [id number], for add lasix 20 qd prn, cont all other  tx

## 2017-11-22 NOTE — Assessment & Plan Note (Signed)
stable overall by history and exam, recent data reviewed with pt, and pt to continue medical treatment as before,  to f/u any worsening symptoms or concerns  

## 2017-11-22 NOTE — Progress Notes (Signed)
 Subjective:    Patient ID: [name], female    DOB: 03/19/1936, 82 y.o.   MRN: [id number]  HPI  Here with numerous complaints,  Pt denies chest pain, increasing sob or doe, wheezing, orthopnea, PND, increased LE swelling, palpitations, dizziness or syncope.  Pt denies new neurological symptoms such as new headache, or facial or extremity weakness or numbness.  Pt denies polydipsia, polyuria,  Pt states overall good compliance with meds, Has had more hair loss recently, c/o itching all over worse to right shoulder with rash, as well as feet & leg swelling x 6 wks or slightly more,  Has + erythem rash, itchy, non painful, right shoulder, seemed to start with gabapentin/vicodin use to her.  Pt continues to have recurring LBP without change in severity, bowel or bladder change, fever, wt loss,  worsening LE pain/numbness/weakness, gait change or falls, but tramadol no help before.  Also with Nocturia every night multiple times per night, has been offered OAB med prior but did not take, will accept now as she just cant get a good nights rest, and having intermittent incontinence..    Also with mild bilat leg swelling with a few lbs wt gain Wt Readings from Last 3 Encounters:  11/22/17 164 lb (74.4 kg)  11/05/17 164 lb (74.4 kg)  09/21/17 161 lb (73 kg)   Past Medical History:  Diagnosis Date  . Allergic rhinitis 10/30/2016  . Anemia   . Breast cancer of upper-outer quadrant of left female breast (HCC) 11/08/2013   ER/PR+ Her2- Left IDC   . Chronic renal insufficiency   . Chronic rhinitis   . Colon polyp   . Diastolic dysfunction 07/10/2016  . DJD (degenerative joint disease)   . Full dentures   . GERD (gastroesophageal reflux disease)   . Hypertension   . Impaired glucose tolerance 07/18/2014  . Memory loss   . Morbid obesity (HCC)   . Vertigo   . Wears glasses    Past Surgical History:  Procedure Laterality Date  . ABDOMINAL HYSTERECTOMY    . BREAST LUMPECTOMY WITH NEEDLE  LOCALIZATION AND AXILLARY SENTINEL LYMPH NODE BX Left 12/04/2013   Procedure: BREAST LUMPECTOMY WITH NEEDLE LOCALIZATION AND AXILLARY SENTINEL LYMPH NODE BX;  Surgeon: David H Newman, MD;  Location: [facility];  Service: General;  Laterality: Left;  . CATARACT EXTRACTION  2009   rt  . COLONOSCOPY    . KNEE ARTHROSCOPY     both  . TONSILLECTOMY    . TOTAL KNEE ARTHROPLASTY  2002   rt  . TOTAL KNEE ARTHROPLASTY  2003   left  . VESICOVAGINAL FISTULA CLOSURE W/ TAH  1980    reports that  has never smoked. she has never used smokeless tobacco. She reports that she does not drink alcohol or use drugs. family history includes Colon cancer in her mother; Heart attack (age of onset: 51) in her son; Heart disease in her unknown relative; Lung cancer in her brother; Stomach cancer in her mother. Allergies  Allergen Reactions  . Iron     REACTION: hives/whelps/bad constipation   Current Outpatient Medications on File Prior to Visit  Medication Sig Dispense Refill  . albuterol (PROVENTIL HFA;VENTOLIN HFA) 108 (90 Base) MCG/ACT inhaler Inhale 2 puffs every 6 (six) hours as needed into the lungs for wheezing or shortness of breath. 1 Inhaler 11  . amLODipine (NORVASC) 5 MG tablet Take 1 tablet (5 mg total) by mouth daily. 90 tablet 1  . aspirin   81 MG tablet Take 81 mg by mouth daily.      . budesonide-formoterol (SYMBICORT) 160-4.5 MCG/ACT inhaler Inhale 2 puffs 2 (two) times daily into the lungs. 1 Inhaler 11  . Cholecalciferol (VITAMIN D3) 2000 UNITS TABS Take 1 tablet by mouth daily.     [name] glucosamine-chondroitin 500-400 MG tablet Take 1 tablet by mouth daily.     . meclizine (ANTIVERT) 12.5 MG tablet TAKE 1 TABLET THREE TIMES DAILY AS NEEDED  FOR  DIZZINESS 270 tablet 0  . Multiple Vitamins-Minerals (CENTRUM SILVER PO) Take 1 tablet by mouth daily.      . pantoprazole (PROTONIX) 40 MG tablet Take 1 tablet (40 mg total) by mouth daily. 90 tablet 2  . Potassium Gluconate 550 (90 K) MG  TABS Take 1 tablet by mouth daily.    . Simethicone (GAS-X PO) Take by mouth. Take as directed     . tamoxifen (NOLVADEX) 20 MG tablet Take 1 tablet (20 mg total) by mouth daily. 90 tablet 3  . triamcinolone (NASACORT AQ) 55 MCG/ACT AERO nasal inhaler Place 2 sprays into the nose daily. 1 Inhaler 12  . Vitamin D, Ergocalciferol, (DRISDOL) 50000 units CAPS capsule Take 1 capsule (50,000 Units total) by mouth every 7 (seven) days. 12 capsule 0   No current facility-administered medications on file prior to visit.    Review of Systems  Constitutional: Negative for other unusual diaphoresis or sweats HENT: Negative for ear discharge or swelling Eyes: Negative for other worsening visual disturbances Respiratory: Negative for stridor or other swelling  Gastrointestinal: Negative for worsening distension or other blood Genitourinary: Negative for retention or other urinary change Musculoskeletal: Negative for other MSK pain or swelling Skin: Negative for color change or other new lesions Neurological: Negative for worsening tremors and other numbness  Psychiatric/Behavioral: Negative for worsening agitation or other fatigue All other system neg per pt    Objective:   Physical Exam BP 134/86   Pulse 88   Temp 98.1 F (36.7 C) (Oral)   Ht 5' 4" (1.626 m)   Wt 164 lb (74.4 kg)   SpO2 96%   BMI 28.15 kg/m  VS noted,  Constitutional: Pt appears in NAD HENT: Head: NCAT.  Right Ear: External ear normal.  Left Ear: External ear normal.  Eyes: . Pupils are equal, round, and reactive to light. Conjunctivae and EOM are normal Nose: without d/c or deformity Neck: Neck supple. Gross normal ROM Cardiovascular: Normal rate and regular rhythm.   Pulmonary/Chest: Effort normal and breath sounds without rales or wheezing.  Abd:  Soft, NT, ND, + BS, no organomegaly Neurological: Pt is alert. At baseline orientation, motor grossly intact Skin: Skin is warm. No rashes I can appreciate, no other new  lesions, trace to 1+ bilat LE edema Psychiatric: Pt behavior is normal without agitation  No other exam findings    Assessment & Plan:

## 2017-11-22 NOTE — Assessment & Plan Note (Addendum)
Etiology unclear, for depomedrol IM 80, beneadryl prn, and stop gabapentin and vicodin, also triam cr for worst areas   Note:  Total time for pt hx, exam, review of record with pt in the room, determination of diagnoses and plan for further eval and tx is > 40 min, with over 50% spent in coordination and counseling of patient including the differential dx, tx, further evaluation and other management of itching of unclear etiology,. Urinary frequency and nocturia, and persistent leg edema

## 2017-11-29 ENCOUNTER — Telehealth: Payer: Self-pay

## 2017-11-29 ENCOUNTER — Ambulatory Visit
Admission: RE | Admit: 2017-11-29 | Discharge: 2017-11-29 | Disposition: A | Discharge status: Home / Self Care | Payer: Medicare HMO | Source: Ambulatory Visit | Attending: Internal Medicine | Admitting: Internal Medicine

## 2017-11-29 ENCOUNTER — Encounter: Payer: Self-pay | Admitting: Internal Medicine

## 2017-11-29 ENCOUNTER — Other Ambulatory Visit: Payer: Self-pay | Admitting: Internal Medicine

## 2017-11-29 DIAGNOSIS — M48061 Spinal stenosis, lumbar region without neurogenic claudication: Secondary | ICD-10-CM | POA: Diagnosis not present

## 2017-11-29 DIAGNOSIS — M545 Low back pain, unspecified: Secondary | ICD-10-CM

## 2017-11-29 DIAGNOSIS — G8929 Other chronic pain: Secondary | ICD-10-CM

## 2017-11-29 MED ORDER — GABAPENTIN 100 MG PO CAPS: 100.0000 mg | ORAL_CAPSULE | Freq: Three times a day (TID) | ORAL | 5 refills | Status: DC

## 2017-11-29 NOTE — Telephone Encounter (Signed)
Called, LVM  Created CRM

## 2017-11-29 NOTE — Telephone Encounter (Signed)
-----   Message from Biagio Borg, MD sent at 11/29/2017 11:09 AM EST ----- Left message on MyChart, pt to cont same tx except  The test results show that your current treatment is OK, except there is very severe lumbar spine arthritis and several areas of severe nerve pinching.  I would like to add gabapentin 100 mg three times per day, to try to help with the pain.  Also, you should see Orthopedic as although you may not want surgury, there may options to improve the pain.  You should also hear from the office about this.[name] to please inform pt, I will do rx and referral

## 2017-12-06 ENCOUNTER — Ambulatory Visit: Payer: Self-pay

## 2017-12-06 ENCOUNTER — Ambulatory Visit: Payer: Medicare HMO | Admitting: Internal Medicine

## 2017-12-06 NOTE — Telephone Encounter (Signed)
Pt states has been wheezing "for years" and has been dx with asthma by PCP per pt. Pt c/o non productive cough that began last Thursday 12/02/17 with worsening wheezing that has "gotten loud" Pt states that she dd not sleep well last night. Pt denies fever, SOB. Is using her Albuterol MDI Q6 hours with relief. Pt has an appt this Friday with PCP. Attempted to get pt in Wednesday but pt could not come at the time available. Advised pt to continue drinking fluids, get a humidifier and if worsens to call back or go to nearest [facility] or ED.    Reason for Disposition . Wheezing is present  Answer Assessment - Initial Assessment Questions 1. ONSET: "When did the cough begin?"      Last Thursday 2. SEVERITY: "How bad is the cough today?"      Keeping her from sleeping well 3. RESPIRATORY DISTRESS: "Describe your breathing."      Increased wheezing and is using her MDI. Wanting to use more often 4. FEVER: "Do you have a fever?" If so, ask: "What is your temperature, how was it measured, and when did it start?"     no 5. HEMOPTYSIS: "Are you coughing up any blood?" If so ask: "How much?" (flecks, streaks, tablespoons, etc.)     no 6. TREATMENT: "What have you done so far to treat the cough?" (e.g., meds, fluids, humidifier)     Albuterol for wheezing, drinking more water 7. CARDIAC HISTORY: "Do you have any history of heart disease?" (e.g., heart attack, congestive heart failure)      No  8. LUNG HISTORY: "Do you have any history of lung disease?"  (e.g., pulmonary embolus, asthma, emphysema)     asthma 9. PE RISK FACTORS: "Do you have a history of blood clots?" (or: recent major surgery, recent prolonged travel, bedridden )     Yes to to one leg no longer on blood thinners 10. OTHER SYMPTOMS: "Do you have any other symptoms? (e.g., runny nose, wheezing, chest pain)       Wheezing that has worsened, no chest pain, no runny nose 11. PREGNANCY: "Is there any chance you are pregnant?" "When was your last  menstrual period?"       n/a 12. TRAVEL: "Have you traveled out of the country in the last month?" (e.g., travel history, exposures)       no  Protocols used: COUGH - ACUTE NON-PRODUCTIVE-A-AH

## 2017-12-10 ENCOUNTER — Ambulatory Visit (INDEPENDENT_AMBULATORY_CARE_PROVIDER_SITE_OTHER)
Admission: RE | Admit: 2017-12-10 | Discharge: 2017-12-10 | Disposition: A | Discharge status: Home / Self Care | Payer: Medicare HMO | Source: Ambulatory Visit | Attending: Internal Medicine | Admitting: Internal Medicine

## 2017-12-10 ENCOUNTER — Encounter: Payer: Self-pay | Admitting: Internal Medicine

## 2017-12-10 ENCOUNTER — Ambulatory Visit (INDEPENDENT_AMBULATORY_CARE_PROVIDER_SITE_OTHER): Payer: Medicare HMO | Admitting: Internal Medicine

## 2017-12-10 VITALS — BP 120/78 | HR 78 | Temp 98.6°F | Ht 64.0 in | Wt 169.0 lb

## 2017-12-10 DIAGNOSIS — R05 Cough: Secondary | ICD-10-CM | POA: Diagnosis not present

## 2017-12-10 DIAGNOSIS — R059 Cough, unspecified: Secondary | ICD-10-CM

## 2017-12-10 DIAGNOSIS — R062 Wheezing: Secondary | ICD-10-CM | POA: Diagnosis not present

## 2017-12-10 DIAGNOSIS — M25561 Pain in right knee: Secondary | ICD-10-CM

## 2017-12-10 DIAGNOSIS — R7302 Impaired glucose tolerance (oral): Secondary | ICD-10-CM

## 2017-12-10 DIAGNOSIS — M25562 Pain in left knee: Secondary | ICD-10-CM

## 2017-12-10 MED ORDER — TRAMADOL HCL 50 MG PO TABS: 50.0000 mg | ORAL_TABLET | Freq: Four times a day (QID) | ORAL | 1 refills | Status: DC | PRN

## 2017-12-10 MED ORDER — PREDNISONE 10 MG PO TABS: ORAL_TABLET | ORAL | 0 refills | Status: DC

## 2017-12-10 MED ORDER — METHYLPREDNISOLONE ACETATE 80 MG/ML IJ SUSP
80.0000 mg | Freq: Once | INTRAMUSCULAR | Status: AC
  Administered 2017-12-10: 80 mg via INTRAMUSCULAR

## 2017-12-10 MED ORDER — LEVOFLOXACIN 250 MG PO TABS: 250.0000 mg | ORAL_TABLET | Freq: Every day | ORAL | 0 refills | Status: DC

## 2017-12-10 MED ORDER — HYDROCODONE-HOMATROPINE 5-1.5 MG/5ML PO SYRP: 5.0000 mL | ORAL_SOLUTION | Freq: Four times a day (QID) | ORAL | 0 refills | Status: AC | PRN

## 2017-12-10 NOTE — Patient Instructions (Addendum)
.  You had the steroid shot today  Please take all new medication as prescribed - the antibiotic, prednisone, cough medicine if needed, and tramadol for pain as needed  Please continue all other medications as before, including both of your inhalers  Please have the pharmacy call with any other refills you may need.  Please keep your appointments with your specialists as you may have planned  Please go to the XRAY Department in the Basement (go straight as you get off the elevator) for the x-ray testing  You will be contacted by phone if any changes need to be made immediately.  Otherwise, you will receive a letter about your results with an explanation, but please check with MyChart first.  Please remember to sign up for MyChart if you have not done so, as this will be important to you in the future with finding out test results, communicating by private email, and scheduling acute appointments online when needed.

## 2017-12-10 NOTE — Assessment & Plan Note (Signed)
Has hx of RA, but cant r/o OA or gout, should also improve with steroid tx if gout; ok for tramadol prn, walks with cane, no falls , declines PT referral

## 2017-12-10 NOTE — Assessment & Plan Note (Signed)
Asympt, mild,  Lab Results  Component Value Date   HGBA1C 5.4 06/02/2016  stable overall by history and exam, recent data reviewed with pt, and pt to continue medical treatment as before,  to call for any worsening polys or cbg > 200 with tx and illness

## 2017-12-10 NOTE — Assessment & Plan Note (Signed)
Mild to mod, c/w bronchitis vs pna, for cxr, for antibx course, cough med prn,  to f/u any worsening symptoms or concerns 

## 2017-12-10 NOTE — Assessment & Plan Note (Signed)
Mild to mod, liekly bronchospastic related, for depomedrol 80 IM, predpac course,  cxr as above, and to f/u any worsening symptoms or concerns

## 2017-12-10 NOTE — Progress Notes (Signed)
Subjective:    Patient ID: [name], female    DOB: 04/29/1936, 82 y.o.   MRN: [id number]  HPI   Here with acute onset mild to mod 2-3 days ST, HA, general weakness and malaise, with prod cough greenish sputum, but Pt denies chest pain, increased sob or doe, wheezing, orthopnea, PND, increased LE swelling, palpitations, dizziness or syncope, except for onset mild wheezing and sob since last PM despite increased inhaler use   Pt denies polydipsia, polyuria.  Pt denies new neurological symptoms such as new headache, or facial or extremity weakness or numbness.  Does have increased bilat knee stiffness and pain today, no giveaways, swelling or falls Past Medical History:  Diagnosis Date  . Allergic rhinitis 10/30/2016  . Anemia   . Breast cancer of upper-outer quadrant of left female breast ([place]) 11/08/2013   ER/PR+ Her2- Left IDC   . Chronic renal insufficiency   . Chronic rhinitis   . Colon polyp   . Diastolic dysfunction 2/56/3893  . DJD (degenerative joint disease)   . Full dentures   . GERD (gastroesophageal reflux disease)   . Hypertension   . Impaired glucose tolerance 07/18/2014  . Memory loss   . Morbid obesity ([place])   . Vertigo   . Wears glasses    Past Surgical History:  Procedure Laterality Date  . ABDOMINAL HYSTERECTOMY    . BREAST LUMPECTOMY WITH NEEDLE LOCALIZATION AND AXILLARY SENTINEL LYMPH NODE BX Left 12/04/2013   Procedure: BREAST LUMPECTOMY WITH NEEDLE LOCALIZATION AND AXILLARY SENTINEL LYMPH NODE BX;  Surgeon: Shann Medal, MD;  Location: [facility];  Service: General;  Laterality: Left;  . CATARACT EXTRACTION  2009   rt  . COLONOSCOPY    . KNEE ARTHROSCOPY     both  . TONSILLECTOMY    . TOTAL KNEE ARTHROPLASTY  2002   rt  . TOTAL KNEE ARTHROPLASTY  2003   left  . VESICOVAGINAL FISTULA CLOSURE W/ TAH  1980    reports that  has never smoked. she has never used smokeless tobacco. She reports that she does not drink alcohol or use  drugs. family history includes Colon cancer in her mother; Heart attack (age of onset: 65) in her son; Heart disease in her unknown relative; Lung cancer in her brother; Stomach cancer in her mother. Allergies  Allergen Reactions  . Iron     REACTION: hives/whelps/bad constipation   Current Outpatient Medications on File Prior to Visit  Medication Sig Dispense Refill  . albuterol (PROVENTIL HFA;VENTOLIN HFA) 108 (90 Base) MCG/ACT inhaler Inhale 2 puffs every 6 (six) hours as needed into the lungs for wheezing or shortness of breath. 1 Inhaler 11  . amLODipine (NORVASC) 5 MG tablet Take 1 tablet (5 mg total) by mouth daily. 90 tablet 1  . aspirin 81 MG tablet Take 81 mg by mouth daily.      . budesonide-formoterol (SYMBICORT) 160-4.5 MCG/ACT inhaler Inhale 2 puffs 2 (two) times daily into the lungs. 1 Inhaler 11  . Cholecalciferol (VITAMIN D3) 2000 UNITS TABS Take 1 tablet by mouth daily.     . furosemide (LASIX) 20 MG tablet Take 1 tablet (20 mg total) by mouth daily as needed. 90 tablet 3  . gabapentin (NEURONTIN) 100 MG capsule Take 1 capsule (100 mg total) by mouth 3 (three) times daily. 90 capsule 5  . glucosamine-chondroitin 500-400 MG tablet Take 1 tablet by mouth daily.     [name] losartan-hydrochlorothiazide (HYZAAR) 100-25 MG tablet Take  1 tablet by mouth daily. 90 tablet 1  . meclizine (ANTIVERT) 12.5 MG tablet TAKE 1 TABLET THREE TIMES DAILY AS NEEDED  FOR  DIZZINESS 270 tablet 0  . Multiple Vitamins-Minerals (CENTRUM SILVER PO) Take 1 tablet by mouth daily.      . pantoprazole (PROTONIX) 40 MG tablet Take 1 tablet (40 mg total) by mouth daily. 90 tablet 2  . Potassium Gluconate 550 (90 K) MG TABS Take 1 tablet by mouth daily.    . Simethicone (GAS-X PO) Take by mouth. Take as directed     . solifenacin (VESICARE) 5 MG tablet Take 1 tablet (5 mg total) by mouth daily. 90 tablet 3  . tamoxifen (NOLVADEX) 20 MG tablet Take 1 tablet (20 mg total) by mouth daily. 90 tablet 3  .  triamcinolone (NASACORT AQ) 55 MCG/ACT AERO nasal inhaler Place 2 sprays into the nose daily. 1 Inhaler 12  . triamcinolone cream (KENALOG) 0.1 % Apply 1 application topically 2 (two) times daily. 30 g 0  . Vitamin D, Ergocalciferol, (DRISDOL) 50000 units CAPS capsule Take 1 capsule (50,000 Units total) by mouth every 7 (seven) days. 12 capsule 0   No current facility-administered medications on file prior to visit.    Review of Systems  Constitutional: Negative for other unusual diaphoresis or sweats HENT: Negative for ear discharge or swelling Eyes: Negative for other worsening visual disturbances Respiratory: Negative for stridor or other swelling  Gastrointestinal: Negative for worsening distension or other blood Genitourinary: Negative for retention or other urinary change Musculoskeletal: Negative for other MSK pain or swelling Skin: Negative for color change or other new lesions Neurological: Negative for worsening tremors and other numbness  Psychiatric/Behavioral: Negative for worsening agitation or other fatigue All other system neg per pt    Objective:   Physical Exam BP 120/78   Pulse 78   Temp 98.6 F (37 C) (Oral)   Ht 5' 4" (1.626 m)   Wt 169 lb (76.7 kg)   SpO2 96%   BMI 29.01 kg/m  VS noted, mild ill, fatigued Constitutional: Pt appears in NAD HENT: Head: NCAT.  Right Ear: External ear normal.  Left Ear: External ear normal.  Eyes: . Pupils are equal, round, and reactive to light. Conjunctivae and EOM are normal Bilat tm's with mild erythema.  Max sinus areas non tender.  Pharynx with mild erythema, no exudate  Nose: without d/c or deformity Neck: Neck supple. Gross normal ROM Cardiovascular: Normal rate and regular rhythm.   Pulmonary/Chest: Effort normal and breath sounds decreased without rales but with scattered bilat insp wheezing.  Bilat knees with trace effusions, NT but decreased ROM Neurological: Pt is alert. At baseline orientation, motor grossly  intact Skin: Skin is warm. No rashes, other new lesions, no LE edema Psychiatric: Pt behavior is normal without agitation  No other exam findings    Assessment & Plan:

## 2017-12-15 ENCOUNTER — Encounter: Payer: Self-pay | Admitting: Oncology

## 2017-12-15 DIAGNOSIS — R928 Other abnormal and inconclusive findings on diagnostic imaging of breast: Secondary | ICD-10-CM | POA: Diagnosis not present

## 2017-12-15 DIAGNOSIS — Z853 Personal history of malignant neoplasm of breast: Secondary | ICD-10-CM | POA: Diagnosis not present

## 2017-12-16 DIAGNOSIS — M5106 Intervertebral disc disorders with myelopathy, lumbar region: Secondary | ICD-10-CM | POA: Diagnosis not present

## 2017-12-21 ENCOUNTER — Other Ambulatory Visit: Payer: Self-pay | Admitting: Neurological Surgery

## 2017-12-21 DIAGNOSIS — I10 Essential (primary) hypertension: Secondary | ICD-10-CM | POA: Diagnosis not present

## 2017-12-21 DIAGNOSIS — Z6829 Body mass index (BMI) 29.0-29.9, adult: Secondary | ICD-10-CM | POA: Diagnosis not present

## 2017-12-21 DIAGNOSIS — M5126 Other intervertebral disc displacement, lumbar region: Secondary | ICD-10-CM | POA: Diagnosis not present

## 2017-12-22 ENCOUNTER — Encounter (HOSPITAL_COMMUNITY): Payer: Self-pay | Admitting: *Deleted

## 2017-12-22 ENCOUNTER — Other Ambulatory Visit: Payer: Self-pay

## 2017-12-22 NOTE — Progress Notes (Signed)
Spoke with pt for pre-op call. Pt denies cardiac history or chest pain. Pt states she has a hx of asthma and has sob with it. Pt states she is not diabetic.

## 2017-12-23 ENCOUNTER — Observation Stay (HOSPITAL_COMMUNITY)
Admission: RE | Admit: 2017-12-23 | Discharge: 2017-12-24 | Disposition: A | Discharge status: Home / Self Care | Payer: Medicare HMO | Source: Ambulatory Visit | Attending: Neurological Surgery | Admitting: Neurological Surgery

## 2017-12-23 ENCOUNTER — Ambulatory Visit (HOSPITAL_COMMUNITY): Payer: Medicare HMO | Admitting: Certified Registered Nurse Anesthetist

## 2017-12-23 ENCOUNTER — Ambulatory Visit (HOSPITAL_COMMUNITY): Payer: Medicare HMO

## 2017-12-23 ENCOUNTER — Encounter (HOSPITAL_COMMUNITY)
Admission: RE | Disposition: A | Discharge status: Home / Self Care | Payer: Self-pay | Source: Ambulatory Visit | Attending: Neurological Surgery

## 2017-12-23 ENCOUNTER — Encounter (HOSPITAL_COMMUNITY): Payer: Self-pay | Admitting: Urology

## 2017-12-23 ENCOUNTER — Other Ambulatory Visit: Payer: Self-pay

## 2017-12-23 ENCOUNTER — Ambulatory Visit: Payer: Medicare HMO | Admitting: Oncology

## 2017-12-23 DIAGNOSIS — Z9889 Other specified postprocedural states: Secondary | ICD-10-CM | POA: Insufficient documentation

## 2017-12-23 DIAGNOSIS — Z981 Arthrodesis status: Secondary | ICD-10-CM | POA: Diagnosis not present

## 2017-12-23 DIAGNOSIS — Z7951 Long term (current) use of inhaled steroids: Secondary | ICD-10-CM | POA: Diagnosis not present

## 2017-12-23 DIAGNOSIS — R413 Other amnesia: Secondary | ICD-10-CM | POA: Diagnosis not present

## 2017-12-23 DIAGNOSIS — Z853 Personal history of malignant neoplasm of breast: Secondary | ICD-10-CM | POA: Insufficient documentation

## 2017-12-23 DIAGNOSIS — R0600 Dyspnea, unspecified: Secondary | ICD-10-CM | POA: Insufficient documentation

## 2017-12-23 DIAGNOSIS — Z6828 Body mass index (BMI) 28.0-28.9, adult: Secondary | ICD-10-CM | POA: Insufficient documentation

## 2017-12-23 DIAGNOSIS — Z170 Estrogen receptor positive status [ER+]: Secondary | ICD-10-CM | POA: Diagnosis not present

## 2017-12-23 DIAGNOSIS — Z8249 Family history of ischemic heart disease and other diseases of the circulatory system: Secondary | ICD-10-CM | POA: Insufficient documentation

## 2017-12-23 DIAGNOSIS — M069 Rheumatoid arthritis, unspecified: Secondary | ICD-10-CM | POA: Diagnosis not present

## 2017-12-23 DIAGNOSIS — H9190 Unspecified hearing loss, unspecified ear: Secondary | ICD-10-CM | POA: Diagnosis not present

## 2017-12-23 DIAGNOSIS — M5116 Intervertebral disc disorders with radiculopathy, lumbar region: Principal | ICD-10-CM | POA: Insufficient documentation

## 2017-12-23 DIAGNOSIS — Z800 Family history of malignant neoplasm of digestive organs: Secondary | ICD-10-CM | POA: Insufficient documentation

## 2017-12-23 DIAGNOSIS — I129 Hypertensive chronic kidney disease with stage 1 through stage 4 chronic kidney disease, or unspecified chronic kidney disease: Secondary | ICD-10-CM | POA: Diagnosis not present

## 2017-12-23 DIAGNOSIS — Z419 Encounter for procedure for purposes other than remedying health state, unspecified: Secondary | ICD-10-CM

## 2017-12-23 DIAGNOSIS — J310 Chronic rhinitis: Secondary | ICD-10-CM | POA: Insufficient documentation

## 2017-12-23 DIAGNOSIS — Z96653 Presence of artificial knee joint, bilateral: Secondary | ICD-10-CM | POA: Insufficient documentation

## 2017-12-23 DIAGNOSIS — K219 Gastro-esophageal reflux disease without esophagitis: Secondary | ICD-10-CM | POA: Diagnosis not present

## 2017-12-23 DIAGNOSIS — Z801 Family history of malignant neoplasm of trachea, bronchus and lung: Secondary | ICD-10-CM | POA: Insufficient documentation

## 2017-12-23 DIAGNOSIS — R7302 Impaired glucose tolerance (oral): Secondary | ICD-10-CM | POA: Diagnosis not present

## 2017-12-23 DIAGNOSIS — N189 Chronic kidney disease, unspecified: Secondary | ICD-10-CM | POA: Diagnosis not present

## 2017-12-23 DIAGNOSIS — R42 Dizziness and giddiness: Secondary | ICD-10-CM | POA: Insufficient documentation

## 2017-12-23 DIAGNOSIS — Z90710 Acquired absence of both cervix and uterus: Secondary | ICD-10-CM | POA: Insufficient documentation

## 2017-12-23 DIAGNOSIS — M48061 Spinal stenosis, lumbar region without neurogenic claudication: Secondary | ICD-10-CM | POA: Insufficient documentation

## 2017-12-23 DIAGNOSIS — Z79899 Other long term (current) drug therapy: Secondary | ICD-10-CM | POA: Diagnosis not present

## 2017-12-23 DIAGNOSIS — R51 Headache: Secondary | ICD-10-CM | POA: Insufficient documentation

## 2017-12-23 DIAGNOSIS — M1990 Unspecified osteoarthritis, unspecified site: Secondary | ICD-10-CM | POA: Insufficient documentation

## 2017-12-23 DIAGNOSIS — Z7982 Long term (current) use of aspirin: Secondary | ICD-10-CM | POA: Diagnosis not present

## 2017-12-23 DIAGNOSIS — J45909 Unspecified asthma, uncomplicated: Secondary | ICD-10-CM | POA: Insufficient documentation

## 2017-12-23 DIAGNOSIS — M5126 Other intervertebral disc displacement, lumbar region: Secondary | ICD-10-CM | POA: Diagnosis not present

## 2017-12-23 DIAGNOSIS — I739 Peripheral vascular disease, unspecified: Secondary | ICD-10-CM | POA: Insufficient documentation

## 2017-12-23 DIAGNOSIS — I10 Essential (primary) hypertension: Secondary | ICD-10-CM | POA: Diagnosis not present

## 2017-12-23 DIAGNOSIS — Z9841 Cataract extraction status, right eye: Secondary | ICD-10-CM | POA: Diagnosis not present

## 2017-12-23 DIAGNOSIS — M419 Scoliosis, unspecified: Secondary | ICD-10-CM | POA: Insufficient documentation

## 2017-12-23 DIAGNOSIS — G8929 Other chronic pain: Secondary | ICD-10-CM | POA: Insufficient documentation

## 2017-12-23 HISTORY — DX: Unspecified hearing loss, unspecified ear: H91.90

## 2017-12-23 HISTORY — DX: Dyspnea, unspecified: R06.00

## 2017-12-23 HISTORY — DX: Other specified symptoms and signs involving the circulatory and respiratory systems: R09.89

## 2017-12-23 HISTORY — DX: Unspecified asthma, uncomplicated: J45.909

## 2017-12-23 HISTORY — PX: LUMBAR LAMINECTOMY/DECOMPRESSION MICRODISCECTOMY: SHX5026

## 2017-12-23 LAB — BASIC METABOLIC PANEL
Anion gap: 16 — ABNORMAL HIGH (ref 5–15)
BUN: 55 mg/dL — ABNORMAL HIGH (ref 6–20)
CO2: 24 mmol/L (ref 22–32)
Calcium: 9.1 mg/dL (ref 8.9–10.3)
Chloride: 92 mmol/L — ABNORMAL LOW (ref 101–111)
Creatinine, Ser: 1.93 mg/dL — ABNORMAL HIGH (ref 0.44–1.00)
GFR calc Af Amer: 27 mL/min — ABNORMAL LOW (ref >60)
GFR calc non Af Amer: 23 mL/min — ABNORMAL LOW (ref >60)
Glucose, Bld: 100 mg/dL — ABNORMAL HIGH (ref 65–99)
Potassium: 3.2 mmol/L — ABNORMAL LOW (ref 3.5–5.1)
Sodium: 132 mmol/L — ABNORMAL LOW (ref 135–145)

## 2017-12-23 LAB — CBC
HCT: 33.1 % — ABNORMAL LOW (ref 36.0–46.0)
Hemoglobin: 10.7 g/dL — ABNORMAL LOW (ref 12.0–15.0)
MCH: 31.4 pg (ref 26.0–34.0)
MCHC: 32.3 g/dL (ref 30.0–36.0)
MCV: 97.1 fL (ref 78.0–100.0)
Platelets: 195 10*3/uL (ref 150–400)
RBC: 3.41 MIL/uL — ABNORMAL LOW (ref 3.87–5.11)
RDW: 12.9 % (ref 11.5–15.5)
WBC: 10.7 10*3/uL — ABNORMAL HIGH (ref 4.0–10.5)

## 2017-12-23 LAB — SURGICAL PCR SCREEN
MRSA, PCR: NEGATIVE
Staphylococcus aureus: NEGATIVE

## 2017-12-23 SURGERY — LUMBAR LAMINECTOMY/DECOMPRESSION MICRODISCECTOMY 1 LEVEL
Anesthesia: General | Site: Spine Lumbar | Laterality: Left

## 2017-12-23 MED ORDER — SODIUM CHLORIDE 0.9 % IV SOLN: 250.0000 mL | INTRAVENOUS | Status: DC

## 2017-12-23 MED ORDER — HYDROCODONE-ACETAMINOPHEN 5-325 MG PO TABS: 2.0000 | ORAL_TABLET | ORAL | Status: DC | PRN

## 2017-12-23 MED ORDER — BUPIVACAINE HCL (PF) 0.25 % IJ SOLN
INTRAMUSCULAR | Status: AC
  Filled 2017-12-23: qty 30

## 2017-12-23 MED ORDER — PROMETHAZINE HCL 25 MG/ML IJ SOLN: 6.2500 mg | INTRAMUSCULAR | Status: DC | PRN

## 2017-12-23 MED ORDER — ARTIFICIAL TEARS OPHTHALMIC OINT
TOPICAL_OINTMENT | OPHTHALMIC | Status: DC | PRN
  Administered 2017-12-23: 1 via OPHTHALMIC

## 2017-12-23 MED ORDER — PROPOFOL 10 MG/ML IV BOLUS
INTRAVENOUS | Status: AC
  Filled 2017-12-23: qty 20

## 2017-12-23 MED ORDER — DEXAMETHASONE SODIUM PHOSPHATE 10 MG/ML IJ SOLN
INTRAMUSCULAR | Status: DC | PRN
  Administered 2017-12-23: 5 mg via INTRAVENOUS

## 2017-12-23 MED ORDER — SODIUM CHLORIDE 0.9 % IR SOLN
Status: DC | PRN
  Administered 2017-12-23: 11:00:00

## 2017-12-23 MED ORDER — ARTIFICIAL TEARS OPHTHALMIC OINT
TOPICAL_OINTMENT | OPHTHALMIC | Status: AC
  Filled 2017-12-23: qty 3.5

## 2017-12-23 MED ORDER — SUGAMMADEX SODIUM 200 MG/2ML IV SOLN
INTRAVENOUS | Status: AC
  Filled 2017-12-23: qty 2

## 2017-12-23 MED ORDER — ROCURONIUM BROMIDE 100 MG/10ML IV SOLN
INTRAVENOUS | Status: DC | PRN
  Administered 2017-12-23: 10 mg via INTRAVENOUS
  Administered 2017-12-23: 60 mg via INTRAVENOUS
  Administered 2017-12-23: 10 mg via INTRAVENOUS

## 2017-12-23 MED ORDER — CHLORHEXIDINE GLUCONATE CLOTH 2 % EX PADS: 6.0000 | MEDICATED_PAD | Freq: Once | CUTANEOUS | Status: DC

## 2017-12-23 MED ORDER — HYDROCODONE-ACETAMINOPHEN 5-325 MG PO TABS
1.0000 | ORAL_TABLET | ORAL | Status: DC | PRN
Start: 2017-12-23 — End: 2017-12-24
  Administered 2017-12-23 – 2017-12-24 (×4): 1 via ORAL
  Filled 2017-12-23 (×3): qty 1

## 2017-12-23 MED ORDER — DEXAMETHASONE SODIUM PHOSPHATE 10 MG/ML IJ SOLN
INTRAMUSCULAR | Status: AC
  Filled 2017-12-23: qty 1

## 2017-12-23 MED ORDER — SENNA 8.6 MG PO TABS
1.0000 | ORAL_TABLET | Freq: Two times a day (BID) | ORAL | Status: DC
  Administered 2017-12-23 (×2): 8.6 mg via ORAL
  Filled 2017-12-23 (×2): qty 1

## 2017-12-23 MED ORDER — MOMETASONE FURO-FORMOTEROL FUM 200-5 MCG/ACT IN AERO
2.0000 | INHALATION_SPRAY | Freq: Two times a day (BID) | RESPIRATORY_TRACT | Status: DC
  Administered 2017-12-23 – 2017-12-24 (×2): 2 via RESPIRATORY_TRACT
  Filled 2017-12-23: qty 8.8

## 2017-12-23 MED ORDER — CEFAZOLIN SODIUM-DEXTROSE 2-4 GM/100ML-% IV SOLN
2.0000 g | Freq: Three times a day (TID) | INTRAVENOUS | Status: AC
  Administered 2017-12-23 – 2017-12-24 (×2): 2 g via INTRAVENOUS
  Filled 2017-12-23 (×2): qty 100

## 2017-12-23 MED ORDER — CEFAZOLIN SODIUM-DEXTROSE 2-4 GM/100ML-% IV SOLN
2.0000 g | INTRAVENOUS | Status: AC
  Administered 2017-12-23: 2 g via INTRAVENOUS
  Filled 2017-12-23: qty 100

## 2017-12-23 MED ORDER — HEMOSTATIC AGENTS (NO CHARGE) OPTIME
TOPICAL | Status: DC | PRN
  Administered 2017-12-23: 1 via TOPICAL

## 2017-12-23 MED ORDER — SODIUM CHLORIDE 0.9% FLUSH: 3.0000 mL | INTRAVENOUS | Status: DC | PRN

## 2017-12-23 MED ORDER — METHOCARBAMOL 500 MG PO TABS
ORAL_TABLET | ORAL | Status: AC
  Filled 2017-12-23: qty 1

## 2017-12-23 MED ORDER — PHENYLEPHRINE 40 MCG/ML (10ML) SYRINGE FOR IV PUSH (FOR BLOOD PRESSURE SUPPORT)
PREFILLED_SYRINGE | INTRAVENOUS | Status: DC | PRN
  Administered 2017-12-23: 40 ug via INTRAVENOUS
  Administered 2017-12-23: 80 ug via INTRAVENOUS

## 2017-12-23 MED ORDER — ALBUTEROL SULFATE (2.5 MG/3ML) 0.083% IN NEBU: 2.5000 mg | INHALATION_SOLUTION | Freq: Four times a day (QID) | RESPIRATORY_TRACT | Status: DC | PRN

## 2017-12-23 MED ORDER — METHOCARBAMOL 1000 MG/10ML IJ SOLN
500.0000 mg | Freq: Four times a day (QID) | INTRAMUSCULAR | Status: DC | PRN
  Filled 2017-12-23: qty 5

## 2017-12-23 MED ORDER — PHENYLEPHRINE 40 MCG/ML (10ML) SYRINGE FOR IV PUSH (FOR BLOOD PRESSURE SUPPORT)
PREFILLED_SYRINGE | INTRAVENOUS | Status: AC
  Filled 2017-12-23: qty 10

## 2017-12-23 MED ORDER — ACETAMINOPHEN 325 MG PO TABS: 650.0000 mg | ORAL_TABLET | ORAL | Status: DC | PRN

## 2017-12-23 MED ORDER — ONDANSETRON HCL 4 MG/2ML IJ SOLN: 4.0000 mg | Freq: Four times a day (QID) | INTRAMUSCULAR | Status: DC | PRN

## 2017-12-23 MED ORDER — LIDOCAINE 2% (20 MG/ML) 5 ML SYRINGE
INTRAMUSCULAR | Status: AC
  Filled 2017-12-23: qty 5

## 2017-12-23 MED ORDER — POTASSIUM CHLORIDE IN NACL 20-0.9 MEQ/L-% IV SOLN: INTRAVENOUS | Status: DC

## 2017-12-23 MED ORDER — HYDROCODONE-ACETAMINOPHEN 5-325 MG PO TABS
ORAL_TABLET | ORAL | Status: AC
  Filled 2017-12-23: qty 1

## 2017-12-23 MED ORDER — MUPIROCIN 2 % EX OINT: 1.0000 "application " | TOPICAL_OINTMENT | Freq: Once | CUTANEOUS | Status: DC

## 2017-12-23 MED ORDER — LACTATED RINGERS IV SOLN
INTRAVENOUS | Status: DC
  Administered 2017-12-23: 09:00:00 via INTRAVENOUS

## 2017-12-23 MED ORDER — 0.9 % SODIUM CHLORIDE (POUR BTL) OPTIME
TOPICAL | Status: DC | PRN
  Administered 2017-12-23: 1000 mL

## 2017-12-23 MED ORDER — THROMBIN 5000 UNITS EX SOLR
CUTANEOUS | Status: AC
  Filled 2017-12-23: qty 5000

## 2017-12-23 MED ORDER — MENTHOL 3 MG MT LOZG: 1.0000 | LOZENGE | OROMUCOSAL | Status: DC | PRN

## 2017-12-23 MED ORDER — KETOROLAC TROMETHAMINE 30 MG/ML IJ SOLN
INTRAMUSCULAR | Status: AC
  Filled 2017-12-23: qty 1

## 2017-12-23 MED ORDER — AMLODIPINE BESYLATE 5 MG PO TABS
5.0000 mg | ORAL_TABLET | Freq: Every day | ORAL | Status: DC
  Administered 2017-12-23 – 2017-12-24 (×2): 5 mg via ORAL
  Filled 2017-12-23 (×2): qty 1

## 2017-12-23 MED ORDER — LOSARTAN POTASSIUM 50 MG PO TABS
100.0000 mg | ORAL_TABLET | Freq: Every day | ORAL | Status: DC
  Administered 2017-12-23: 100 mg via ORAL
  Filled 2017-12-23: qty 2

## 2017-12-23 MED ORDER — PANTOPRAZOLE SODIUM 40 MG PO TBEC
40.0000 mg | DELAYED_RELEASE_TABLET | Freq: Every day | ORAL | Status: DC
  Administered 2017-12-23 – 2017-12-24 (×2): 40 mg via ORAL
  Filled 2017-12-23 (×2): qty 1

## 2017-12-23 MED ORDER — PHENOL 1.4 % MT LIQD: 1.0000 | OROMUCOSAL | Status: DC | PRN

## 2017-12-23 MED ORDER — PROPOFOL 10 MG/ML IV BOLUS
INTRAVENOUS | Status: DC | PRN
  Administered 2017-12-23: 120 mg via INTRAVENOUS

## 2017-12-23 MED ORDER — CELECOXIB 200 MG PO CAPS
200.0000 mg | ORAL_CAPSULE | Freq: Two times a day (BID) | ORAL | Status: DC
  Administered 2017-12-23 – 2017-12-24 (×3): 200 mg via ORAL
  Filled 2017-12-23 (×3): qty 1

## 2017-12-23 MED ORDER — GABAPENTIN 100 MG PO CAPS
100.0000 mg | ORAL_CAPSULE | Freq: Three times a day (TID) | ORAL | Status: DC
  Administered 2017-12-23 – 2017-12-24 (×3): 100 mg via ORAL
  Filled 2017-12-23 (×3): qty 1

## 2017-12-23 MED ORDER — HYDROCHLOROTHIAZIDE 25 MG PO TABS
25.0000 mg | ORAL_TABLET | Freq: Every day | ORAL | Status: DC
  Administered 2017-12-23: 25 mg via ORAL
  Filled 2017-12-23 (×2): qty 1

## 2017-12-23 MED ORDER — TAMOXIFEN CITRATE 10 MG PO TABS
20.0000 mg | ORAL_TABLET | Freq: Every day | ORAL | Status: DC
  Administered 2017-12-24: 20 mg via ORAL
  Filled 2017-12-23: qty 2

## 2017-12-23 MED ORDER — FENTANYL CITRATE (PF) 250 MCG/5ML IJ SOLN
INTRAMUSCULAR | Status: AC
  Filled 2017-12-23: qty 5

## 2017-12-23 MED ORDER — ONDANSETRON HCL 4 MG/2ML IJ SOLN
INTRAMUSCULAR | Status: AC
  Filled 2017-12-23: qty 2

## 2017-12-23 MED ORDER — SUGAMMADEX SODIUM 200 MG/2ML IV SOLN
INTRAVENOUS | Status: DC | PRN
  Administered 2017-12-23: 150 mg via INTRAVENOUS

## 2017-12-23 MED ORDER — PHENYLEPHRINE HCL 10 MG/ML IJ SOLN
INTRAVENOUS | Status: DC | PRN
  Administered 2017-12-23: 25 ug/min via INTRAVENOUS

## 2017-12-23 MED ORDER — FUROSEMIDE 20 MG PO TABS
20.0000 mg | ORAL_TABLET | Freq: Every day | ORAL | Status: DC
  Administered 2017-12-23 – 2017-12-24 (×2): 20 mg via ORAL
  Filled 2017-12-23 (×2): qty 1

## 2017-12-23 MED ORDER — SODIUM CHLORIDE 0.9% FLUSH
3.0000 mL | Freq: Two times a day (BID) | INTRAVENOUS | Status: DC
  Administered 2017-12-23: 3 mL via INTRAVENOUS

## 2017-12-23 MED ORDER — FENTANYL CITRATE (PF) 100 MCG/2ML IJ SOLN
25.0000 ug | INTRAMUSCULAR | Status: DC | PRN
  Administered 2017-12-23 (×2): 50 ug via INTRAVENOUS

## 2017-12-23 MED ORDER — METHOCARBAMOL 500 MG PO TABS
500.0000 mg | ORAL_TABLET | Freq: Four times a day (QID) | ORAL | Status: DC | PRN
  Administered 2017-12-23 – 2017-12-24 (×3): 500 mg via ORAL
  Filled 2017-12-23 (×3): qty 1

## 2017-12-23 MED ORDER — LIDOCAINE HCL (CARDIAC) 20 MG/ML IV SOLN
INTRAVENOUS | Status: DC | PRN
  Administered 2017-12-23: 80 mg via INTRAVENOUS

## 2017-12-23 MED ORDER — ROCURONIUM BROMIDE 10 MG/ML (PF) SYRINGE
PREFILLED_SYRINGE | INTRAVENOUS | Status: AC
  Filled 2017-12-23: qty 5

## 2017-12-23 MED ORDER — LOSARTAN POTASSIUM-HCTZ 100-25 MG PO TABS
1.0000 | ORAL_TABLET | Freq: Every day | ORAL | Status: DC
Start: 2017-12-23 — End: 2017-12-23

## 2017-12-23 MED ORDER — DARIFENACIN HYDROBROMIDE ER 7.5 MG PO TB24
7.5000 mg | ORAL_TABLET | Freq: Every day | ORAL | Status: DC
  Administered 2017-12-23 – 2017-12-24 (×2): 7.5 mg via ORAL
  Filled 2017-12-23 (×2): qty 1

## 2017-12-23 MED ORDER — FENTANYL CITRATE (PF) 100 MCG/2ML IJ SOLN
INTRAMUSCULAR | Status: AC
  Filled 2017-12-23: qty 2

## 2017-12-23 MED ORDER — MUPIROCIN 2 % EX OINT
TOPICAL_OINTMENT | CUTANEOUS | Status: AC
  Filled 2017-12-23: qty 22

## 2017-12-23 MED ORDER — FENTANYL CITRATE (PF) 100 MCG/2ML IJ SOLN
INTRAMUSCULAR | Status: DC | PRN
  Administered 2017-12-23 (×3): 50 ug via INTRAVENOUS

## 2017-12-23 MED ORDER — ACETAMINOPHEN 650 MG RE SUPP: 650.0000 mg | RECTAL | Status: DC | PRN

## 2017-12-23 MED ORDER — ONDANSETRON HCL 4 MG/2ML IJ SOLN
INTRAMUSCULAR | Status: DC | PRN
  Administered 2017-12-23: 4 mg via INTRAVENOUS

## 2017-12-23 MED ORDER — ONDANSETRON HCL 4 MG PO TABS: 4.0000 mg | ORAL_TABLET | Freq: Four times a day (QID) | ORAL | Status: DC | PRN

## 2017-12-23 MED ORDER — MORPHINE SULFATE (PF) 4 MG/ML IV SOLN
2.0000 mg | INTRAVENOUS | Status: DC | PRN
  Administered 2017-12-23: 2 mg via INTRAVENOUS
  Filled 2017-12-23: qty 1

## 2017-12-23 MED ORDER — BUPIVACAINE HCL (PF) 0.25 % IJ SOLN
INTRAMUSCULAR | Status: DC | PRN
  Administered 2017-12-23: 4 mL

## 2017-12-23 MED ORDER — HYDROCODONE-ACETAMINOPHEN 5-325 MG PO TABS
1.0000 | ORAL_TABLET | Freq: Four times a day (QID) | ORAL | Status: DC | PRN
Start: 2017-12-23 — End: 2017-12-23
  Administered 2017-12-23 (×2): 1 via ORAL
  Filled 2017-12-23 (×2): qty 1

## 2017-12-23 MED ORDER — THROMBIN (RECOMBINANT) 5000 UNITS EX SOLR
OROMUCOSAL | Status: DC | PRN
  Administered 2017-12-23: 11:00:00 via TOPICAL

## 2017-12-23 SURGICAL SUPPLY — 52 items
ADH SKN CLS APL DERMABOND .7 (GAUZE/BANDAGES/DRESSINGS) ×1
APL SKNCLS STERI-STRIP NONHPOA (GAUZE/BANDAGES/DRESSINGS) ×1
BAG DECANTER FOR FLEXI CONT (MISCELLANEOUS) ×2 IMPLANT
BENZOIN TINCTURE PRP APPL 2/3 (GAUZE/BANDAGES/DRESSINGS) ×2 IMPLANT
BUR MATCHSTICK NEURO 3.0 LAGG (BURR) ×3 IMPLANT
CANISTER SUCT 3000ML PPV (MISCELLANEOUS) ×2 IMPLANT
CARTRIDGE OIL MAESTRO DRILL (MISCELLANEOUS) ×1 IMPLANT
DERMABOND ADVANCED (GAUZE/BANDAGES/DRESSINGS) ×1
DERMABOND ADVANCED .7 DNX12 (GAUZE/BANDAGES/DRESSINGS) IMPLANT
DIFFUSER DRILL AIR PNEUMATIC (MISCELLANEOUS) ×2 IMPLANT
DRAPE LAPAROTOMY 100X72X124 (DRAPES) ×2 IMPLANT
DRAPE MICROSCOPE LEICA (MISCELLANEOUS) ×2 IMPLANT
DRAPE POUCH INSTRU U-SHP 10X18 (DRAPES) ×2 IMPLANT
DRAPE SURG 17X23 STRL (DRAPES) ×2 IMPLANT
DRSG OPSITE POSTOP 4X6 (GAUZE/BANDAGES/DRESSINGS) ×1 IMPLANT
DURAPREP 26ML APPLICATOR (WOUND CARE) ×2 IMPLANT
ELECT REM PT RETURN 9FT ADLT (ELECTROSURGICAL) ×2
ELECTRODE REM PT RTRN 9FT ADLT (ELECTROSURGICAL) ×1 IMPLANT
GAUZE SPONGE 4X4 16PLY XRAY LF (GAUZE/BANDAGES/DRESSINGS) IMPLANT
GLOVE BIO SURGEON STRL SZ7 (GLOVE) ×1 IMPLANT
GLOVE BIO SURGEON STRL SZ8 (GLOVE) ×2 IMPLANT
GLOVE BIOGEL PI IND STRL 7.0 (GLOVE) IMPLANT
GLOVE BIOGEL PI IND STRL 7.5 (GLOVE) IMPLANT
GLOVE BIOGEL PI INDICATOR 7.0 (GLOVE) ×2
GLOVE BIOGEL PI INDICATOR 7.5 (GLOVE) ×2
GLOVE SS N UNI LF 6.5 STRL (GLOVE) ×3 IMPLANT
GOWN STRL REUS W/ TWL LRG LVL3 (GOWN DISPOSABLE) IMPLANT
GOWN STRL REUS W/ TWL XL LVL3 (GOWN DISPOSABLE) ×1 IMPLANT
GOWN STRL REUS W/TWL 2XL LVL3 (GOWN DISPOSABLE) IMPLANT
GOWN STRL REUS W/TWL LRG LVL3 (GOWN DISPOSABLE) ×4
GOWN STRL REUS W/TWL XL LVL3 (GOWN DISPOSABLE) ×4
HEMOSTAT POWDER KIT SURGIFOAM (HEMOSTASIS) ×1 IMPLANT
KIT BASIN OR (CUSTOM PROCEDURE TRAY) ×2 IMPLANT
KIT ROOM TURNOVER OR (KITS) ×2 IMPLANT
NDL HYPO 25X1 1.5 SAFETY (NEEDLE) ×1 IMPLANT
NDL SPNL 20GX3.5 QUINCKE YW (NEEDLE) IMPLANT
NEEDLE HYPO 25X1 1.5 SAFETY (NEEDLE) ×2 IMPLANT
NEEDLE SPNL 20GX3.5 QUINCKE YW (NEEDLE) IMPLANT
NS IRRIG 1000ML POUR BTL (IV SOLUTION) ×2 IMPLANT
OIL CARTRIDGE MAESTRO DRILL (MISCELLANEOUS) ×2
PACK LAMINECTOMY NEURO (CUSTOM PROCEDURE TRAY) ×2 IMPLANT
PAD ARMBOARD 7.5X6 YLW CONV (MISCELLANEOUS) ×6 IMPLANT
RUBBERBAND STERILE (MISCELLANEOUS) ×4 IMPLANT
SPONGE SURGIFOAM ABS GEL SZ50 (HEMOSTASIS) ×1 IMPLANT
STRIP CLOSURE SKIN 1/2X4 (GAUZE/BANDAGES/DRESSINGS) ×2 IMPLANT
SUT VIC AB 0 CT1 18XCR BRD8 (SUTURE) ×1 IMPLANT
SUT VIC AB 0 CT1 8-18 (SUTURE) ×2
SUT VIC AB 2-0 CP2 18 (SUTURE) ×2 IMPLANT
SUT VIC AB 3-0 SH 8-18 (SUTURE) ×2 IMPLANT
TOWEL GREEN STERILE (TOWEL DISPOSABLE) ×2 IMPLANT
TOWEL GREEN STERILE FF (TOWEL DISPOSABLE) ×2 IMPLANT
WATER STERILE IRR 1000ML POUR (IV SOLUTION) ×2 IMPLANT

## 2017-12-23 NOTE — Anesthesia Preprocedure Evaluation (Addendum)
Anesthesia Evaluation  Patient identified by MRN, date of birth, ID band Patient awake    Reviewed: Allergy & Precautions, NPO status , Patient's Chart, lab work & pertinent test results  History of Anesthesia Complications Negative for: history of anesthetic complications  Airway Mallampati: II  TM Distance: >3 FB Neck ROM: Full    Dental  (+) Edentulous Upper, Edentulous Lower, Dental Advisory Given   Pulmonary asthma ,    Pulmonary exam normal        Cardiovascular hypertension, + Peripheral Vascular Disease  Normal cardiovascular exam  Study Highlights     Nuclear stress EF: 72%. No wall motion abnormalities  There was no ST segment deviation noted during stress.  Defect 1: There is a small defect of mild severity present in the apical anterior and apex location. This may be indicative of old apical infarct pattern  Overall low risk study.     Neuro/Psych  Headaches,  Neuromuscular disease negative psych ROS   GI/Hepatic Neg liver ROS, GERD  ,  Endo/Other  negative endocrine ROS  Renal/GU      Musculoskeletal  (+) Arthritis ,   Abdominal   Peds  Hematology negative hematology ROS (+)   Anesthesia Other Findings   Reproductive/Obstetrics                            Anesthesia Physical Anesthesia Plan  ASA: III  Anesthesia Plan: General   Post-op Pain Management:    Induction: Intravenous  PONV Risk Score and Plan: 4 or greater and Ondansetron, Dexamethasone, Treatment may vary due to age or medical condition and Diphenhydramine  Airway Management Planned: Oral ETT  Additional Equipment:   Intra-op Plan:   Post-operative Plan: Extubation in OR  Informed Consent: I have reviewed the patients History and Physical, chart, labs and discussed the procedure including the risks, benefits and alternatives for the proposed anesthesia with the patient or authorized  representative who has indicated his/her understanding and acceptance.   Dental advisory given  Plan Discussed with: CRNA and Anesthesiologist  Anesthesia Plan Comments:        Anesthesia Quick Evaluation

## 2017-12-23 NOTE — Transfer of Care (Signed)
Immediate Anesthesia Transfer of Care Note  Patient: [name]  Procedure(s) Performed: Left Lumbar One-Two Laminectomy with microdiscectomy (Left Spine Lumbar)  Patient Location: PACU  Anesthesia Type:General  Level of Consciousness: awake, alert  and oriented  Airway & Oxygen Therapy: Patient Spontanous Breathing and Patient connected to nasal cannula oxygen  Post-op Assessment: Report given to RN and Post -op Vital signs reviewed and stable  Post vital signs: Reviewed and stable  Last Vitals:  Vitals:   12/23/17 0855 12/23/17 1213  BP: 138/70 (!) 155/72  Pulse: 71 64  Resp: 16 14  Temp: 37 C 36.7 C  SpO2: 100% 93%    Last Pain:  Vitals:   12/23/17 0855  TempSrc: Oral  PainSc: 8       Patients Stated Pain Goal: 4 (21/74/71 5953)  Complications: No apparent anesthesia complications

## 2017-12-23 NOTE — Anesthesia Postprocedure Evaluation (Signed)
Anesthesia Post Note  Patient: [name]  Procedure(s) Performed: Left Lumbar One-Two Laminectomy with microdiscectomy (Left Spine Lumbar)     Patient location during evaluation: PACU Anesthesia Type: General Level of consciousness: sedated Pain management: pain level controlled Vital Signs Assessment: post-procedure vital signs reviewed and stable Respiratory status: spontaneous breathing and respiratory function stable Cardiovascular status: stable Postop Assessment: no apparent nausea or vomiting Anesthetic complications: no    Last Vitals:  Vitals:   12/23/17 1311 12/23/17 1320  BP:  122/82  Pulse: 80 77  Resp: 12 12  Temp:  36.7 C  SpO2: 93% 96%    Last Pain:  Vitals:   12/23/17 1320  TempSrc:   PainSc: 3                  Kionna Brier DANIEL

## 2017-12-23 NOTE — Anesthesia Procedure Notes (Signed)
Procedure Name: Intubation Date/Time: 12/23/2017 10:20 AM Performed by: Candis Shine, CRNA Pre-anesthesia Checklist: Patient identified, Emergency Drugs available, Suction available and Patient being monitored Patient Re-evaluated:Patient Re-evaluated prior to induction Oxygen Delivery Method: Circle System Utilized Preoxygenation: Pre-oxygenation with 100% oxygen Induction Type: IV induction Ventilation: Mask ventilation without difficulty and Oral airway inserted - appropriate to patient size Laryngoscope Size: Mac and 3 Grade View: Grade I Tube type: Oral Tube size: 7.0 mm Number of attempts: 1 Airway Equipment and Method: Stylet and Oral airway Placement Confirmation: ETT inserted through vocal cords under direct vision,  positive ETCO2 and breath sounds checked- equal and bilateral Secured at: 22 cm Tube secured with: Tape Dental Injury: Teeth and Oropharynx as per pre-operative assessment

## 2017-12-23 NOTE — Op Note (Signed)
12/23/2017  12:05 PM  PATIENT:  [name]  82 y.o. female  PRE-OPERATIVE DIAGNOSIS:  Large lumbar disc herniation L1-2 with severe spinal stenosis, back pain with radiculopathy  POST-OPERATIVE DIAGNOSIS:  same  PROCEDURE:  Decompressive lumbar laminectomy, bilateral medial facetectomy, and foraminotomy followed by discectomy L1-2  SURGEON:  Sherley Bounds, MD  ASSISTANTS: Meyran FNP  ANESTHESIA:   General  EBL: 100 ml  Total I/O In: -  Out: 100 [Blood:100]  BLOOD ADMINISTERED: none  DRAINS: None  SPECIMEN:  none  INDICATION FOR PROCEDURE: This patient presented with severe back and leg pain. Imaging showed large disc herniation L1-2 with severe spinal stenosis. The patient tried conservative measures without relief. Pain was debilitating. Recommended L1-2 decompression and discectomy. Patient understood the risks, benefits, and alternatives and potential outcomes and wished to proceed.  PROCEDURE DETAILS: The patient was taken to the operating room and after induction of adequate generalized endotracheal anesthesia, the patient was rolled into the prone position on the Wilson frame and all pressure points were padded. The lumbar region was cleaned and then prepped with DuraPrep and draped in the usual sterile fashion. 5 cc of local anesthesia was injected and then a dorsal midline incision was made and carried down to the lumbo sacral fascia. The fascia was opened and the paraspinous musculature was taken down in a subperiosteal fashion to expose L1-2 bilaterally. Intraoperative x-ray confirmed my level, and then I removed the spinous process and used a combination of the high-speed drill and the Kerrison punches to perform a laminectomy, medial facetectomy, and foraminotomy at L1-2 bilaterally. The underlying yellow ligament was opened and removed in a piecemeal fashion to expose the underlying dura and exiting nerve root. I undercut the lateral recess and dissected down until  I was medial to and distal to the pedicle bilaterally. The nerve root was well decompressed. We then gently retracted the nerve root medially with a retractor, coagulated the epidural venous vasculature, and found a large disc herniation with an inferior fragment. I used a nerve hook and a coronary dilator to sweep under the dura and remove several more fragments of disc. I performed a thorough intradiscal discectomy with pituitary rongeurs and curettes, until I had a nice decompression of the nerve root and the midline. I then palpated with a coronary dilator along the nerve root and into the foramen and in the midline from both sides to assure adequate decompression. I felt no more compression of the nerve root or the thecal sac. Obviously, most of this disc herniation was ventral to the dura and therefore not directly visible. I palpated multiple times with a coronary dilator in the midline to try to remove as much of the ventral disc as I could safely. I irrigated with saline solution containing bacitracin. Achieved hemostasis with bipolar cautery, lined the dura with Gelfoam, and then closed the fascia with 0 Vicryl. I closed the subcutaneous tissues with 2-0 Vicryl and the subcuticular tissues with 3-0 Vicryl. The skin was then closed with benzoin and Steri-Strips. The drapes were removed, a sterile dressing was applied. The patient was awakened from general anesthesia and transferred to the recovery room in stable condition. At the end of the procedure all sponge, needle and instrument counts were correct.    PLAN OF CARE: Admit for overnight observation  PATIENT DISPOSITION:  PACU - hemodynamically stable.   Delay start of Pharmacological VTE agent (>24hrs) due to surgical blood loss or risk of bleeding:  yes

## 2017-12-23 NOTE — Evaluation (Addendum)
Physical Therapy Evaluation Patient Details Name: [name] MRN: [id number] DOB: 03/27/1936 Today's Date: 12/23/2017   History of Present Illness  Pt is an 82 y/o female s/p L1-2 laminectomy and discectomy. PMH includes Asthma, breast cancer s/p L lumpectomy, HTN, and vertigo.   Clinical Impression  Patient is s/p above surgery resulting in the deficits listed below (see PT Problem List). Pt limited in tolerance secondary to back pain and LLE muscle spasm pain. Min to min guard assist for mobility this session. Educated about back precautions following surgery. Anticipate pt will progress well once pain controlled. Patient will benefit from skilled PT to increase their independence and safety with mobility (while adhering to their precautions) to allow discharge to the venue listed below.     Follow Up Recommendations Home health PT;Supervision for mobility/OOB    Equipment Recommendations  None recommended by PT    Recommendations for Other Services OT consult     Precautions / Restrictions Precautions Precautions: Back;Fall Precaution Booklet Issued: Yes (comment) Precaution Comments: Reviewed back precautions with pt and family.  Restrictions Weight Bearing Restrictions: No      Mobility  Bed Mobility Overal bed mobility: Needs Assistance Bed Mobility: Supine to Sit;Sit to Supine     Supine to sit: Mod assist;+2 for physical assistance Sit to supine: Mod assist;+2 for physical assistance   General bed mobility comments: Mod A +2 to use helecopter technique. Pt with increased spasms, so unable to perform log roll. Daughter assisted with transfer.  Transfers Overall transfer level: Needs assistance Equipment used: Rolling walker (2 wheeled) Transfers: Sit to/from Stand Sit to Stand: Min assist;+2 physical assistance         General transfer comment: Min A +2 to stand secondary to LLE spasms. Increased time required.   Ambulation/Gait Ambulation/Gait  assistance: Min assist;Min guard Ambulation Distance (Feet): 75 Feet Assistive device: Rolling walker (2 wheeled) Gait Pattern/deviations: Step-through pattern;Decreased stride length Gait velocity: Decreased Gait velocity interpretation: Below normal speed for age/gender General Gait Details: Very slow, guarded gait. Slightly unsteady. Educated about use of RW at home for increase safety. Cues for appropriate proximity to device. Educated about generalized walking program to perform at home.   Stairs            Wheelchair Mobility    Modified Rankin (Stroke Patients Only)       Balance Overall balance assessment: Needs assistance Sitting-balance support: Bilateral upper extremity supported;Feet supported Sitting balance-Leahy Scale: Poor Sitting balance - Comments: Very guarded secondary to spasms in LLE. Required BUE for support.    Standing balance support: Bilateral upper extremity supported;During functional activity Standing balance-Leahy Scale: Poor Standing balance comment: Reliant on BUE support.                              Pertinent Vitals/Pain Pain Assessment: 0-10 Pain Score: 10-Worst pain ever Pain Location: back and L thigh  Pain Descriptors / Indicators: Cramping;Spasm;Operative site guarding;Aching Pain Intervention(s): Limited activity within patient's tolerance;Monitored during session;Repositioned    Home Living Family/patient expects to be discharged to:: Private residence Living Arrangements: Children Available Help at Discharge: Family;Available 24 hours/day Type of Home: House Home Access: Stairs to enter Entrance Stairs-Rails: None Entrance Stairs-Number of Steps: 3 Home Layout: One level Home Equipment: Walker - 2 wheels;Bedside commode;Cane - single point      Prior Function Level of Independence: Independent with assistive device(s)         Comments: Used  cane for ambulation      Hand Dominance         Extremity/Trunk Assessment   Upper Extremity Assessment Upper Extremity Assessment: Defer to OT evaluation    Lower Extremity Assessment Lower Extremity Assessment: LLE deficits/detail LLE Deficits / Details: Spasms limited movement in sitting and supine.     Cervical / Trunk Assessment Cervical / Trunk Assessment: Other exceptions Cervical / Trunk Exceptions: s/p lumbar surgery   Communication   Communication: No difficulties  Cognition Arousal/Alertness: Awake/alert Behavior During Therapy: WFL for tasks assessed/performed Overall Cognitive Status: Within Functional Limits for tasks assessed                                        General Comments General comments (skin integrity, edema, etc.): Pt's daughter present during session. Educated to have nursing staff assist to sit in chair for dinner.     Exercises     Assessment/Plan    PT Assessment Patient needs continued PT services  PT Problem List Decreased strength;Decreased activity tolerance;Decreased range of motion;Decreased balance;Decreased mobility;Decreased knowledge of use of DME;Decreased knowledge of precautions;Pain       PT Treatment Interventions DME instruction;Gait training;Stair training;Therapeutic activities;Therapeutic exercise;Functional mobility training;Balance training;Neuromuscular re-education;Patient/family education    PT Goals (Current goals can be found in the Care Plan section)  Acute Rehab PT Goals Patient Stated Goal: to decrease pain  PT Goal Formulation: With patient Time For Goal Achievement: 01/06/18 Potential to Achieve Goals: Good    Frequency Min 5X/week   Barriers to discharge        Co-evaluation               AM-PAC PT "6 Clicks" Daily Activity  Outcome Measure Difficulty turning over in bed (including adjusting bedclothes, sheets and blankets)?: Unable Difficulty moving from lying on back to sitting on the side of the bed? : Unable Difficulty  sitting down on and standing up from a chair with arms (e.g., wheelchair, bedside commode, etc,.)?: Unable Help needed moving to and from a bed to chair (including a wheelchair)?: A Little Help needed walking in hospital room?: A Little Help needed climbing 3-5 steps with a railing? : A Lot 6 Click Score: 11    End of Session Equipment Utilized During Treatment: Gait belt Activity Tolerance: Patient limited by pain Patient left: in bed;with call bell/phone within reach;with family/visitor present Nurse Communication: Mobility status;Other (comment)(Pt requesting meds for muscle spasms) PT Visit Diagnosis: Unsteadiness on feet (R26.81);Other abnormalities of gait and mobility (R26.89);Pain Pain - Right/Left: Left Pain - part of body: Leg(back )    Time: 4315-4008 PT Time Calculation (min) (ACUTE ONLY): 48 min   Charges:   PT Evaluation $PT Eval Moderate Complexity: 1 Mod PT Treatments $Gait Training: 23-37 mins   PT G Codes:        Leighton Ruff, PT, DPT  Acute Rehabilitation Services  Pager: [phone]   Rudean Hitt 12/23/2017, 5:17 PM

## 2017-12-23 NOTE — H&P (Signed)
Subjective: Patient is a 82 y.o. female admitted for L1-2. Onset of symptoms was several weeks ago, rapidly worsening since that time.  The pain is rated severe, and is located at the across the lower back and radiates to legs. The pain is described as aching and occurs all day. The symptoms have been progressive. Symptoms are exacerbated by exercise. MRI or CT showed large HNP L1-2 with severe stenosis   Past Medical History:  Diagnosis Date  . Allergic rhinitis 10/30/2016  . Anemia   . Asthma   . Breast cancer of upper-outer quadrant of left female breast ([facility]) 11/08/2013   ER/PR+ Her2- Left IDC   . Chronic renal insufficiency   . Chronic rhinitis   . Colon polyp   . Diastolic dysfunction 3/71/6967  . DJD (degenerative joint disease)   . Dyspnea   . Full dentures   . GERD (gastroesophageal reflux disease)   . Hearing loss   . Hypertension   . Impaired glucose tolerance 07/18/2014  . Memory loss   . Morbid obesity ([place])   . Poor circulation   . Vertigo   . Wears glasses     Past Surgical History:  Procedure Laterality Date  . ABDOMINAL HYSTERECTOMY    . BREAST LUMPECTOMY WITH NEEDLE LOCALIZATION AND AXILLARY SENTINEL LYMPH NODE BX Left 12/04/2013   Procedure: BREAST LUMPECTOMY WITH NEEDLE LOCALIZATION AND AXILLARY SENTINEL LYMPH NODE BX;  Surgeon: Shann Medal, MD;  Location: [facility];  Service: General;  Laterality: Left;  . CATARACT EXTRACTION  2009   rt  . COLONOSCOPY    . EYE SURGERY Bilateral    cataract surgery  . KNEE ARTHROSCOPY     both  . TONSILLECTOMY    . TOTAL KNEE ARTHROPLASTY  2002   rt  . TOTAL KNEE ARTHROPLASTY  2003   left  . VESICOVAGINAL FISTULA CLOSURE W/ TAH  1980    Prior to Admission medications   Medication Sig Start Date End Date Taking? Authorizing Provider  albuterol (PROVENTIL HFA;VENTOLIN HFA) 108 (90 Base) MCG/ACT inhaler Inhale 2 puffs every 6 (six) hours as needed into the lungs for wheezing or shortness of breath.  08/31/17  Yes Biagio Borg, MD  amLODipine (NORVASC) 5 MG tablet Take 1 tablet (5 mg total) by mouth daily. 06/30/17  Yes Biagio Borg, MD  aspirin 81 MG tablet Take 81 mg by mouth daily.     Yes [provider]  budesonide-formoterol (SYMBICORT) 160-4.5 MCG/ACT inhaler Inhale 2 puffs 2 (two) times daily into the lungs. 08/31/17  Yes Biagio Borg, MD  furosemide (LASIX) 20 MG tablet Take 1 tablet (20 mg total) by mouth daily as needed. Patient taking differently: Take 20 mg by mouth daily.  11/22/17  Yes Biagio Borg, MD  gabapentin (NEURONTIN) 100 MG capsule Take 1 capsule (100 mg total) by mouth 3 (three) times daily. 11/29/17  Yes Biagio Borg, MD  HYDROcodone-acetaminophen (NORCO/VICODIN) 5-325 MG tablet Take 1 tablet by mouth every 6 (six) hours as needed for moderate pain.   Yes [provider]  losartan-hydrochlorothiazide (HYZAAR) 100-25 MG tablet Take 1 tablet by mouth daily. 11/22/17  Yes Biagio Borg, MD  meclizine (ANTIVERT) 12.5 MG tablet TAKE 1 TABLET THREE TIMES DAILY AS NEEDED  FOR  DIZZINESS Patient taking differently: TAKE 12.5 MG THREE TIMES DAILY AS NEEDED  FOR  DIZZINESS 09/06/17  Yes Biagio Borg, MD  Multiple Vitamins-Minerals (CENTRUM SILVER PO) Take 1 tablet by mouth daily.  Yes [provider]  pantoprazole (PROTONIX) 40 MG tablet Take 1 tablet (40 mg total) by mouth daily. 10/07/17  Yes Biagio Borg, MD  Potassium Gluconate 550 (90 K) MG TABS Take 550 mg by mouth daily.  10/31/15  Yes Magrinat, [name], MD  Simethicone (GAS-X PO) Take 1 tablet by mouth daily as needed (for gas).    Yes [provider]  solifenacin (VESICARE) 5 MG tablet Take 1 tablet (5 mg total) by mouth daily. 11/22/17  Yes Biagio Borg, MD  tamoxifen (NOLVADEX) 20 MG tablet Take 1 tablet (20 mg total) by mouth daily. 08/09/17  Yes Magrinat, [name], MD  traMADol (ULTRAM) 50 MG tablet Take 1 tablet (50 mg total) by mouth every 6 (six) hours as needed. Patient  taking differently: Take 50 mg by mouth every 6 (six) hours as needed for moderate pain.  12/10/17  Yes Biagio Borg, MD  triamcinolone (NASACORT AQ) 55 MCG/ACT AERO nasal inhaler Place 2 sprays into the nose daily. 10/30/16  Yes Biagio Borg, MD  Vitamin D, Ergocalciferol, (DRISDOL) 50000 units CAPS capsule Take 1 capsule (50,000 Units total) by mouth every 7 (seven) days. Patient taking differently: Take 50,000 Units by mouth every Friday.  11/05/17  Yes Lyndal Pulley, DO  triamcinolone cream (KENALOG) 0.1 % Apply 1 application topically 2 (two) times daily. Patient not taking: Reported on 12/21/2017 11/22/17 11/22/18  Biagio Borg, MD   Allergies  Allergen Reactions  . Iron Hives and Other (See Comments)    [place], bad constipation    Social History   Tobacco Use  . Smoking status: Never Smoker  . Smokeless tobacco: Never Used  Substance Use Topics  . Alcohol use: No    Alcohol/week: 0.0 oz    Family History  Problem Relation Age of Onset  . Colon cancer Mother        in her 104's  . Stomach cancer Mother   . Heart disease Unknown        remote family  . Lung cancer Brother        was a smoker  . Heart attack Son 25     Review of Systems  Positive ROS: neg  All other systems have been reviewed and were otherwise negative with the exception of those mentioned in the HPI and as above.  Objective: Vital signs in last 24 hours: Temp:  [98.6 F (37 C)] 98.6 F (37 C) (02/28 0855) Pulse Rate:  [71] 71 (02/28 0855) Resp:  [16] 16 (02/28 0855) BP: (138)/(70) 138/70 (02/28 0855) SpO2:  [100 %] 100 % (02/28 0855) Weight:  [75.8 kg (167 lb)] 75.8 kg (167 lb) (02/28 0855)  General Appearance: Alert, cooperative, no distress, appears stated age Head: Normocephalic, without obvious abnormality, atraumatic Eyes: PERRL, conjunctiva/corneas clear, EOM's intact    Neck: Supple, symmetrical, trachea midline Back: Symmetric, no curvature, ROM normal, no CVA tenderness Lungs:   respirations unlabored Heart: Regular rate and rhythm Abdomen: Soft, non-tender Extremities: Extremities normal, atraumatic, no cyanosis or edema Pulses: 2+ and symmetric all extremities Skin: Skin color, texture, turgor normal, no rashes or lesions  NEUROLOGIC:   Mental status: Alert and oriented x4,  no aphasia, good attention span, fund of knowledge, and memory Motor Exam - grossly normal Sensory Exam - grossly normal Reflexes: trace Coordination - grossly normal Gait - not tested Balance - grossly normal Cranial Nerves: I: smell Not tested  II: visual acuity  OS: nl  OD: nl  II: visual fields Full to confrontation  II: pupils Equal, round, reactive to light  III,VII: ptosis None  III,IV,VI: extraocular muscles  Full ROM  V: mastication Normal  V: facial light touch sensation  Normal  V,VII: corneal reflex  Present  VII: facial muscle function - upper  Normal  VII: facial muscle function - lower Normal  VIII: hearing Not tested  IX: soft palate elevation  Normal  IX,X: gag reflex Present  XI: trapezius strength  5/5  XI: sternocleidomastoid strength 5/5  XI: neck flexion strength  5/5  XII: tongue strength  Normal    Data Review Lab Results  Component Value Date   WBC 10.7 (H) 12/23/2017   HGB 10.7 (L) 12/23/2017   HCT 33.1 (L) 12/23/2017   MCV 97.1 12/23/2017   PLT 195 12/23/2017   Lab Results  Component Value Date   NA 138 07/27/2016   K 4.4 07/27/2016   CL 100 06/02/2016   CO2 25 07/27/2016   BUN 24.7 07/27/2016   CREATININE 1.4 (H) 07/27/2016   GLUCOSE 136 07/27/2016   No results found for: INR, PROTIME  Assessment/Plan:  Estimated body mass index is 28.67 kg/m as calculated from the following:   Height as of this encounter: '5\' 4"'$  (1.626 m).   Weight as of this encounter: 75.8 kg (167 lb). Patient admitted for L12 decompression and diskectomy. Patient has failed a reasonable attempt at conservative therapy.  I explained the condition and  procedure to the patient and answered any questions.  Patient wishes to proceed with procedure as planned. Understands risks/ benefits and typical outcomes of procedure.   Shonya Sumida S 12/23/2017 9:35 AM

## 2017-12-24 ENCOUNTER — Telehealth: Payer: Self-pay | Admitting: *Deleted

## 2017-12-24 ENCOUNTER — Encounter (HOSPITAL_COMMUNITY): Payer: Self-pay | Admitting: Neurological Surgery

## 2017-12-24 DIAGNOSIS — M5116 Intervertebral disc disorders with radiculopathy, lumbar region: Secondary | ICD-10-CM | POA: Diagnosis not present

## 2017-12-24 MED ORDER — HYDROCODONE-ACETAMINOPHEN 5-325 MG PO TABS: 1.0000 | ORAL_TABLET | Freq: Four times a day (QID) | ORAL | 0 refills | Status: DC | PRN

## 2017-12-24 MED FILL — Thrombin For Soln 5000 Unit: CUTANEOUS | Qty: 5000 | Status: AC

## 2017-12-24 NOTE — Progress Notes (Signed)
Patient alert and oriented, mae's well, voiding adequate amount of urine, swallowing without difficulty, no c/o pain at time of discharge. Patient discharged home with family. Script and discharged instructions given to patient. Patient and family stated understanding of instructions given. Patient has an appointment with Dr. jones   

## 2017-12-24 NOTE — Care Management Note (Signed)
Case Management Note  Patient Details  Name: [name] MRN: [id number] Date of Birth: Jun 02, 1936  Subjective/Objective:  82 yr old female s/p L1-2 laminectomy and discectomy.                  Action/Plan: Patient says she has used [facility] in the past and wishes to do so now. Referral called to Neoma Laming, [facility] Liaison. Patient has RW and 3in1, will have family support at discharge.    Expected Discharge Date:  12/24/17               Expected Discharge Plan:  [facility]  In-House Referral:  NA  Discharge planning Services  CM Consult  Post Acute Care Choice:  Home Health Choice offered to:  Patient  DME Arranged:  N/A DME Agency:  NA  HH Arranged:  PT [facility] Agency:  [facility]  Status of Service:  Completed, signed off  If discussed at [place] of Stay Meetings, dates discussed:    Additional Comments:  Ninfa Meeker, RN 12/24/2017, 11:06 AM

## 2017-12-24 NOTE — Telephone Encounter (Signed)
Pt was on TCM 12/23/17 for stenosis/ HNP L1-2. Pt underwent LL/ diskectomy L1-2. The patient tolerated the procedure well and was taken to the recovery room and then to the floor in stable condition. Pt D/C 12/24/17, and will f/u w/specialist in 2 weeks.Marland KitchenJohny Chess

## 2017-12-24 NOTE — Discharge Summary (Signed)
Physician Discharge Summary  Patient ID: [name] MRN: [id number] DOB/AGE: 1935-12-18 82 y.o.  Admit date: 12/23/2017 Discharge date: 12/24/2017  Admission Diagnoses: stenosis/ HNP L1-2    Discharge Diagnoses: same   Discharged Condition: good  Hospital Course: The patient was admitted on 12/23/2017 and taken to the operating room where the patient underwent LL/ diskectomy L1-2. The patient tolerated the procedure well and was taken to the recovery room and then to the floor in stable condition. The hospital course was routine. There were no complications. The wound remained clean dry and intact. Pt had appropriate back soreness. No complaints of leg pain or new N/T/W. The patient remained afebrile with stable vital signs, and tolerated a regular diet. The patient continued to increase activities, and pain was well controlled with oral pain medications.   Consults: None  Significant Diagnostic Studies:  Results for orders placed or performed during the hospital encounter of 12/23/17  Surgical pcr screen  Result Value Ref Range   MRSA, PCR NEGATIVE NEGATIVE   Staphylococcus aureus NEGATIVE NEGATIVE  Basic metabolic panel  Result Value Ref Range   Sodium 132 (L) 135 - 145 mmol/L   Potassium 3.2 (L) 3.5 - 5.1 mmol/L   Chloride 92 (L) 101 - 111 mmol/L   CO2 24 22 - 32 mmol/L   Glucose, Bld 100 (H) 65 - 99 mg/dL   BUN 55 (H) 6 - 20 mg/dL   Creatinine, Ser 1.93 (H) 0.44 - 1.00 mg/dL   Calcium 9.1 8.9 - 10.3 mg/dL   GFR calc non Af Amer 23 (L) >60 mL/min   GFR calc Af Amer 27 (L) >60 mL/min   Anion gap 16 (H) 5 - 15  CBC  Result Value Ref Range   WBC 10.7 (H) 4.0 - 10.5 K/uL   RBC 3.41 (L) 3.87 - 5.11 MIL/uL   Hemoglobin 10.7 (L) 12.0 - 15.0 g/dL   HCT 33.1 (L) 36.0 - 46.0 %   MCV 97.1 78.0 - 100.0 fL   MCH 31.4 26.0 - 34.0 pg   MCHC 32.3 30.0 - 36.0 g/dL   RDW 12.9 11.5 - 15.5 %   Platelets 195 150 - 400 K/uL    Dg Chest 2 View  Result Date: 12/10/2017 CLINICAL  DATA:  Cough and fever with wheezing EXAM: CHEST  2 VIEW COMPARISON:  September 22, 2016 FINDINGS: There is no edema or consolidation. Heart is upper normal in size with pulmonary vascularity within normal limits. No adenopathy. No bone lesions. IMPRESSION: No edema or consolidation. Electronically Signed   By: Lowella Grip III M.D.   On: 12/10/2017 12:28   Dg Lumbar Spine 2-3 Views  Result Date: 12/23/2017 CLINICAL DATA:  Intraoperative localization images EXAM: LUMBAR SPINE - 2-3 VIEW COMPARISON:  MR lumbar spine of 11/30/2007 FINDINGS: The initial cross-table lateral view shows diffuse degenerative disc disease. A needles positioned beneath the spinous process of L2 directed toward the L2-3 interspace. On the second film, an instrument is directed posteriorly directed toward the T12-L1 interspace. IMPRESSION: Localization of T12-L1 on the last image. Electronically Signed   By: Ivar Drape M.D.   On: 12/23/2017 11:16   Mr Lumbar Spine Wo Contrast  Result Date: 11/29/2017 CLINICAL DATA:  Chronic midline low back pain without sciatica EXAM: MRI LUMBAR SPINE WITHOUT CONTRAST TECHNIQUE: Multiplanar, multisequence MR imaging of the lumbar spine was performed. No intravenous contrast was administered. COMPARISON:  Lumbar radiographs 03/10/2016 FINDINGS: Segmentation:  Normal Alignment: Moderate levoscoliosis at L2. Left lateral translation  L2 on L3 and L3 on L4. Normal sagittal alignment Vertebrae: Negative for fracture or mass. Multilevel degenerative changes in the spine. Conus medullaris and cauda equina: Conus extends to the L1-2 level. Conus and cauda equina appear normal. Paraspinal and other soft tissues: Negative for fracture or mass. No fluid collection. Disc levels: T12-L1: Moderately large left-sided disc protrusion extending into the foramen causing left foraminal encroachment. Spinal canal adequate in size. L1-2: Very large extruded disc fragment centrally and left-sided compressing thecal sac  and causing severe spinal stenosis. Tortuous nerve roots above the level of stenosis compatible with severe spinal stenosis. L2-3: Disc degeneration and spurring right greater than left related to scoliosis. Marked right foraminal encroachment with compression of the right L2 nerve root. Bilateral facet hypertrophy and moderate spinal stenosis. L3-4: Disc degeneration and spurring right greater than left due to scoliosis. Severe right foraminal encroachment with compression of the right L3 nerve root. Bilateral facet hypertrophy and moderate spinal stenosis. L4-5: Disc degeneration and spondylosis, left greater than right. Moderate to severe left foraminal encroachment with impingement of the left L5 nerve root in the foramen. Subarticular stenosis on the left. L5-S1: Moderate disc degeneration and spurring with bilateral facet hypertrophy. Moderate to severe right foraminal encroachment. Subarticular stenosis bilaterally. IMPRESSION: Moderate levoscoliosis with multilevel degenerative change Very large extruded disc fragment centrally left-sided L1-2 causing severe spinal stenosis Severe right foraminal encroachment L2-3 with compression right L2 nerve root. Moderate spinal stenosis L2-3 Severe right foraminal encroachment L3-4 with compression of the right L3 nerve root. Moderate spinal stenosis L3-4 Moderate to severe left foraminal encroachment L4-5 with impingement left L5 nerve root. Moderate to severe right foraminal encroachment L5-S1. Electronically Signed   By: Franchot Gallo M.D.   On: 11/29/2017 09:34    Antibiotics:  Anti-infectives (From admission, onward)   Start     Dose/Rate Route Frequency Ordered Stop   12/23/17 1800  ceFAZolin (ANCEF) IVPB 2g/100 mL premix     2 g 200 mL/hr over 30 Minutes Intravenous Every 8 hours 12/23/17 1351 12/24/17 0300   12/23/17 1112  bacitracin 50,000 Units in sodium chloride irrigation 0.9 % 500 mL irrigation  Status:  Discontinued       As needed 12/23/17  1112 12/23/17 1207   12/23/17 0845  ceFAZolin (ANCEF) IVPB 2g/100 mL premix     2 g 200 mL/hr over 30 Minutes Intravenous On call to O.R. 12/23/17 4098 12/23/17 1022      Discharge Exam: Blood pressure (!) 108/54, pulse 73, temperature 98.3 F (36.8 C), temperature source Oral, resp. rate 16, height 5\' 4"  (1.626 m), weight 75.8 kg (167 lb), SpO2 98 %. Neurologic: Grossly normal Dressing dry  Discharge Medications:   Allergies as of 12/24/2017      Reactions   Iron Hives, Other (See Comments)   Whelps, bad constipation      Medication List    TAKE these medications   albuterol 108 (90 Base) MCG/ACT inhaler Commonly known as:  PROVENTIL HFA;VENTOLIN HFA Inhale 2 puffs every 6 (six) hours as needed into the lungs for wheezing or shortness of breath.   amLODipine 5 MG tablet Commonly known as:  NORVASC Take 1 tablet (5 mg total) by mouth daily.   aspirin 81 MG tablet Take 81 mg by mouth daily.   budesonide-formoterol 160-4.5 MCG/ACT inhaler Commonly known as:  SYMBICORT Inhale 2 puffs 2 (two) times daily into the lungs.   CENTRUM SILVER PO Take 1 tablet by mouth daily.   furosemide 20  MG tablet Commonly known as:  LASIX Take 1 tablet (20 mg total) by mouth daily as needed. What changed:  when to take this   gabapentin 100 MG capsule Commonly known as:  NEURONTIN Take 1 capsule (100 mg total) by mouth 3 (three) times daily.   GAS-X PO Take 1 tablet by mouth daily as needed (for gas).   HYDROcodone-acetaminophen 5-325 MG tablet Commonly known as:  NORCO/VICODIN Take 1 tablet by mouth every 6 (six) hours as needed for moderate pain.   losartan-hydrochlorothiazide 100-25 MG tablet Commonly known as:  HYZAAR Take 1 tablet by mouth daily.   meclizine 12.5 MG tablet Commonly known as:  ANTIVERT TAKE 1 TABLET THREE TIMES DAILY AS NEEDED  FOR  DIZZINESS What changed:  See the new instructions.   pantoprazole 40 MG tablet Commonly known as:  PROTONIX Take 1 tablet  (40 mg total) by mouth daily.   Potassium Gluconate 550 (90 K) MG Tabs Take 550 mg by mouth daily.   solifenacin 5 MG tablet Commonly known as:  VESICARE Take 1 tablet (5 mg total) by mouth daily.   tamoxifen 20 MG tablet Commonly known as:  NOLVADEX Take 1 tablet (20 mg total) by mouth daily.   traMADol 50 MG tablet Commonly known as:  ULTRAM Take 1 tablet (50 mg total) by mouth every 6 (six) hours as needed. What changed:  reasons to take this   triamcinolone 55 MCG/ACT Aero nasal inhaler Commonly known as:  NASACORT AQ Place 2 sprays into the nose daily.   triamcinolone cream 0.1 % Commonly known as:  KENALOG Apply 1 application topically 2 (two) times daily.   Vitamin D (Ergocalciferol) 50000 units Caps capsule Commonly known as:  DRISDOL Take 1 capsule (50,000 Units total) by mouth every 7 (seven) days. What changed:  when to take this       Disposition: Home   Final Dx: LL diskectomy L1-2  Discharge Instructions     Remove dressing in 72 hours   Complete by:  As directed    Call MD for:  difficulty breathing, headache or visual disturbances   Complete by:  As directed    Call MD for:  persistant nausea and vomiting   Complete by:  As directed    Call MD for:  redness, tenderness, or signs of infection (pain, swelling, redness, odor or green/yellow discharge around incision site)   Complete by:  As directed    Call MD for:  severe uncontrolled pain   Complete by:  As directed    Call MD for:  temperature >100.4   Complete by:  As directed    Diet - low sodium heart healthy   Complete by:  As directed    Increase activity slowly   Complete by:  As directed          Signed: Marqui Formby S 12/24/2017, 7:30 AM

## 2017-12-24 NOTE — Progress Notes (Signed)
Physical Therapy Treatment Patient Details Name: [name] MRN: [id number] DOB: 10-19-1936 Today's Date: 12/24/2017    History of Present Illness Pt is an 82 y/o female s/p L1-2 laminectomy and discectomy. PMH includes Asthma, breast cancer s/p L lumpectomy, HTN, and vertigo.     PT Comments    Patient progressing very well towards physical therapy goals. This session, patient demonstrated improved pain control, balance, and activity tolerance compared to the previous session. Patient and patient daughter provided extensive education on car transfer, safest assistive device for mobility, supervision for mobility, and maintaining precautions with ADL's. Both verbalized understanding but could benefit from reinforcement; pt required several verbal reminders throughout session in order to maintain spinal precautions during toileting and hygiene activities at the sink.     Follow Up Recommendations  Home health PT;Supervision for mobility/OOB     Equipment Recommendations  None recommended by PT    Recommendations for Other Services       Precautions / Restrictions Precautions Precautions: Back;Fall Precaution Booklet Issued: Yes (comment) Precaution Comments: Reviewed back precautions with pt and family.  Restrictions Weight Bearing Restrictions: No    Mobility  Bed Mobility               General bed mobility comments: Patient sitting on EOB upon PT arrival.  Transfers Overall transfer level: Needs assistance Equipment used: Rolling walker (2 wheeled) Transfers: Sit to/from Stand Sit to Stand: Min assist         General transfer comment: Patient performed sit to stand transfer with min assist to power up and toilet transfers with supervision and RW. Verbal cueing for hand placement; needs reinforcement.  Ambulation/Gait Ambulation/Gait assistance: Supervision Ambulation Distance (Feet): 210 Feet Assistive device: Rolling walker (2 wheeled) Gait  Pattern/deviations: Step-through pattern;Decreased stride length Gait velocity: Decreased Gait velocity interpretation: Below normal speed for age/gender General Gait Details: Slow and guarded gait; verbal cueing for increasing gait speed. VC's also provided for shoulder depression t/o gait.   Stairs Stairs: Yes   Stair Management: One rail Right;With cane Number of Stairs: 3 General stair comments: Patient negotiated stairs with step by step pattern, R railing, and a SPC in the left hand. Very slow with decreased knee flexion with descending.   Wheelchair Mobility    Modified Rankin (Stroke Patients Only)       Balance Overall balance assessment: Needs assistance Sitting-balance support: Bilateral upper extremity supported;Feet supported Sitting balance-Leahy Scale: Fair     Standing balance support: During functional activity;No upper extremity supported Standing balance-Leahy Scale: Fair Standing balance comment: Washed hands at sink with no UE support and supervision                            Cognition Arousal/Alertness: Awake/alert Behavior During Therapy: WFL for tasks assessed/performed Overall Cognitive Status: Within Functional Limits for tasks assessed                                        Exercises      General Comments General comments (skin integrity, edema, etc.): Pt daughter present during session. Pt and pt daughter provided education on maintaining precautions with ADL's, car transfer, and safest assistive device for mobility.       Pertinent Vitals/Pain Pain Assessment: Faces Faces Pain Scale: Hurts a little bit Pain Location: L knee Pain Descriptors / Indicators: Aching Pain  Intervention(s): Monitored during session    Home Living                      Prior Function            PT Goals (current goals can now be found in the care plan section) Acute Rehab PT Goals Patient Stated Goal: to decrease pain   PT Goal Formulation: With patient Time For Goal Achievement: 01/06/18 Potential to Achieve Goals: Good Progress towards PT goals: Progressing toward goals    Frequency    Min 5X/week      PT Plan Current plan remains appropriate    Co-evaluation              AM-PAC PT "6 Clicks" Daily Activity  Outcome Measure  Difficulty turning over in bed (including adjusting bedclothes, sheets and blankets)?: A Little Difficulty moving from lying on back to sitting on the side of the bed? : Unable Difficulty sitting down on and standing up from a chair with arms (e.g., wheelchair, bedside commode, etc,.)?: A Little Help needed moving to and from a bed to chair (including a wheelchair)?: A Little Help needed walking in hospital room?: A Little Help needed climbing 3-5 steps with a railing? : A Little 6 Click Score: 16    End of Session Equipment Utilized During Treatment: Gait belt Activity Tolerance: Patient tolerated treatment well Patient left: in bed;with call bell/phone within reach;with family/visitor present Nurse Communication: Mobility status PT Visit Diagnosis: Unsteadiness on feet (R26.81);Other abnormalities of gait and mobility (R26.89);Pain Pain - Right/Left: Left Pain - part of body: Knee     Time: 9150-5697 PT Time Calculation (min) (ACUTE ONLY): 34 min  Charges:  $Gait Training: 23-37 mins                    G Codes:       Ellamae Sia, PT, DPT Acute Rehabilitation Services    Willy Eddy 12/24/2017, 9:22 AM

## 2017-12-24 NOTE — Evaluation (Signed)
Occupational Therapy Evaluation Patient Details Name: [name] MRN: [id number] DOB: 30-Sep-1936 Today's Date: 12/24/2017    History of Present Illness Pt is an 82 y/o female s/p L1-2 laminectomy and discectomy. PMH includes Asthma, breast cancer s/p L lumpectomy, HTN, and vertigo.    Clinical Impression   This 82 y/o F presents with the above. At baseline pt is mod independent with ADLs and functional mobility using SPC. Pt completing room level functional mobility at RW level with minguard-supervision this session; currently requires MinA for LB ADLs secondary to adhering to back precautions. Education provided on safety, AE, and compensatory techniques for completing ADLs and functional transfers while adhering to precautions with pt/pt's daughter verbalizing and demonstrating understanding throughout. Pt will return home with daughter who is able to assist with ADLs PRN. Questions answered throughout. Feel pt is safe to return home once medically ready with available family assist and with follow up [facility] therapies. No further acute OT needs identified at this time. Will sign off.     Follow Up Recommendations  Home health OT;Supervision/Assistance - 24 hour    Equipment Recommendations  None recommended by OT           Precautions / Restrictions Precautions Precautions: Back;Fall Precaution Booklet Issued: Yes (comment) Precaution Comments: Reviewed back precautions with pt and family.  Restrictions Weight Bearing Restrictions: No      Mobility Bed Mobility Overal bed mobility: Needs Assistance Bed Mobility: Rolling;Sidelying to Sit Rolling: Supervision Sidelying to sit: Min guard       General bed mobility comments: pt performing log roll technique with supervision/minguard for safety   Transfers Overall transfer level: Needs assistance Equipment used: Rolling walker (2 wheeled) Transfers: Sit to/from Stand Sit to Stand: Min guard         General transfer  comment: verbal cues for reinforcement of hand placement; minguard for safety when standing from EOB and toilet     Balance Overall balance assessment: Needs assistance Sitting-balance support: Bilateral upper extremity supported;Feet supported Sitting balance-Leahy Scale: Good     Standing balance support: During functional activity;No upper extremity supported Standing balance-Leahy Scale: Fair Standing balance comment: Washed hands at sink with no UE support and supervision                           ADL either performed or assessed with clinical judgement   ADL Overall ADL's : Needs assistance/impaired Eating/Feeding: Modified independent;Sitting   Grooming: Wash/dry hands;Supervision/safety;Standing   Upper Body Bathing: Min guard;Sitting   Lower Body Bathing: Min guard;Sit to/from stand;With adaptive equipment Lower Body Bathing Details (indicate cue type and reason): educated on use of long handled sponge for increased independence during task while adhering to back precautions; also educated on use of shower seat for increased safety with pt/pt's daughter verbalizing understanding  Upper Body Dressing : Set up;Sitting   Lower Body Dressing: Sit to/from stand;With adaptive equipment;Minimal assistance Lower Body Dressing Details (indicate cue type and reason): educated pt on use of reacher to complete LB dressing with pt return demonstrating with minA for technique and minguard while pt stands to advance pants/underwear over hips; assist provided to don shoes  Toilet Transfer: Ambulation;Regular Toilet;RW;Min guard   Toileting- Clothing Manipulation and Hygiene: Supervision/safety;Sit to/from stand Toileting - Clothing Manipulation Details (indicate cue type and reason): pt able to reach behind body for peri-care with good adherence to back precautions    Tub/Shower Transfer Details (indicate cue type and reason): educated  on safe transfer technique to tub and to  have supervision during transfer for increased safety with pt/pt's daughter verbalizing understanding  Functional mobility during ADLs: Supervision/safety;Min guard;Rolling walker General ADL Comments: education provided on AE, safety and compensatory techniques for completing ADLs and functional transfers while adhering to back precautions                          Pertinent Vitals/Pain Pain Assessment: Faces Faces Pain Scale: Hurts a little bit Pain Location: back  Pain Descriptors / Indicators: Grimacing Pain Intervention(s): Monitored during session          Extremity/Trunk Assessment Upper Extremity Assessment Upper Extremity Assessment: Overall WFL for tasks assessed   Lower Extremity Assessment Lower Extremity Assessment: Defer to PT evaluation   Cervical / Trunk Assessment Cervical / Trunk Assessment: Other exceptions Cervical / Trunk Exceptions: s/p lumbar surgery    Communication Communication Communication: No difficulties   Cognition Arousal/Alertness: Awake/alert Behavior During Therapy: WFL for tasks assessed/performed Overall Cognitive Status: Within Functional Limits for tasks assessed                                     General Comments  Pt daughter present during session. Pt and pt daughter provided education on maintaining precautions with ADL's, car transfer, and safest assistive device for mobility.                Home Living Family/patient expects to be discharged to:: Private residence Living Arrangements: Children Available Help at Discharge: Family;Available 24 hours/day Type of Home: House Home Access: Stairs to enter [employer] of Steps: 3 Entrance Stairs-Rails: None Home Layout: One level     Bathroom Shower/Tub: Teacher, early years/pre: Handicapped height     Home Equipment: Environmental consultant - 2 wheels;Bedside commode;Cane - single point;Shower seat;Adaptive equipment Adaptive Equipment:  Reacher;Long-handled sponge        Prior Functioning/Environment Level of Independence: Independent with assistive device(s)        Comments: Used cane for ambulation         OT Problem List: Decreased activity tolerance;Decreased knowledge of precautions;Decreased knowledge of use of DME or AE            OT Goals(Current goals can be found in the care plan section) Acute Rehab OT Goals Patient Stated Goal: to decrease pain  OT Goal Formulation: All assessment and education complete, DC therapy                                 AM-PAC PT "6 Clicks" Daily Activity     Outcome Measure Help from another person eating meals?: None Help from another person taking care of personal grooming?: None Help from another person toileting, which includes using toliet, bedpan, or urinal?: None Help from another person bathing (including washing, rinsing, drying)?: A Little Help from another person to put on and taking off regular upper body clothing?: None Help from another person to put on and taking off regular lower body clothing?: A Little 6 Click Score: 22   End of Session Equipment Utilized During Treatment: Rolling walker Nurse Communication: Mobility status  Activity Tolerance: Patient tolerated treatment well Patient left: with call bell/phone within reach;with family/visitor present;Other (comment)(sitting EOB )  OT Visit Diagnosis: Other abnormalities of gait and mobility (R26.89)  Time: 7322-5672 OT Time Calculation (min): 37 min Charges:  OT General Charges $OT Visit: 1 Visit OT Evaluation $OT Eval Low Complexity: 1 Low OT Treatments $Self Care/Home Management : 8-22 mins G-Codes:     Lou Cal, OT Pager [phone] 12/24/2017   Raymondo Band 12/24/2017, 10:45 AM

## 2017-12-25 DIAGNOSIS — Z4789 Encounter for other orthopedic aftercare: Secondary | ICD-10-CM | POA: Diagnosis not present

## 2017-12-25 DIAGNOSIS — N189 Chronic kidney disease, unspecified: Secondary | ICD-10-CM | POA: Diagnosis not present

## 2017-12-25 DIAGNOSIS — I5030 Unspecified diastolic (congestive) heart failure: Secondary | ICD-10-CM | POA: Diagnosis not present

## 2017-12-25 DIAGNOSIS — H9190 Unspecified hearing loss, unspecified ear: Secondary | ICD-10-CM | POA: Diagnosis not present

## 2017-12-25 DIAGNOSIS — I130 Hypertensive heart and chronic kidney disease with heart failure and stage 1 through stage 4 chronic kidney disease, or unspecified chronic kidney disease: Secondary | ICD-10-CM | POA: Diagnosis not present

## 2017-12-25 DIAGNOSIS — Z853 Personal history of malignant neoplasm of breast: Secondary | ICD-10-CM | POA: Diagnosis not present

## 2017-12-25 DIAGNOSIS — D631 Anemia in chronic kidney disease: Secondary | ICD-10-CM | POA: Diagnosis not present

## 2017-12-25 DIAGNOSIS — K219 Gastro-esophageal reflux disease without esophagitis: Secondary | ICD-10-CM | POA: Diagnosis not present

## 2017-12-25 DIAGNOSIS — J45909 Unspecified asthma, uncomplicated: Secondary | ICD-10-CM | POA: Diagnosis not present

## 2017-12-26 ENCOUNTER — Encounter (HOSPITAL_COMMUNITY): Payer: Self-pay | Admitting: Emergency Medicine

## 2017-12-26 ENCOUNTER — Observation Stay (HOSPITAL_COMMUNITY)
Admission: EM | Admit: 2017-12-26 | Discharge: 2017-12-29 | Disposition: A | Discharge status: Home / Self Care | Payer: Medicare HMO | Attending: Internal Medicine | Admitting: Internal Medicine

## 2017-12-26 DIAGNOSIS — M79604 Pain in right leg: Secondary | ICD-10-CM | POA: Insufficient documentation

## 2017-12-26 DIAGNOSIS — R2689 Other abnormalities of gait and mobility: Secondary | ICD-10-CM | POA: Insufficient documentation

## 2017-12-26 DIAGNOSIS — I5040 Unspecified combined systolic (congestive) and diastolic (congestive) heart failure: Secondary | ICD-10-CM | POA: Diagnosis not present

## 2017-12-26 DIAGNOSIS — M549 Dorsalgia, unspecified: Secondary | ICD-10-CM | POA: Diagnosis not present

## 2017-12-26 DIAGNOSIS — M5489 Other dorsalgia: Secondary | ICD-10-CM | POA: Diagnosis not present

## 2017-12-26 DIAGNOSIS — N184 Chronic kidney disease, stage 4 (severe): Secondary | ICD-10-CM | POA: Diagnosis present

## 2017-12-26 DIAGNOSIS — N189 Chronic kidney disease, unspecified: Secondary | ICD-10-CM | POA: Insufficient documentation

## 2017-12-26 DIAGNOSIS — R269 Unspecified abnormalities of gait and mobility: Secondary | ICD-10-CM

## 2017-12-26 DIAGNOSIS — R52 Pain, unspecified: Secondary | ICD-10-CM | POA: Diagnosis not present

## 2017-12-26 DIAGNOSIS — I872 Venous insufficiency (chronic) (peripheral): Secondary | ICD-10-CM | POA: Diagnosis present

## 2017-12-26 DIAGNOSIS — N183 Chronic kidney disease, stage 3 unspecified: Secondary | ICD-10-CM | POA: Diagnosis present

## 2017-12-26 DIAGNOSIS — J45909 Unspecified asthma, uncomplicated: Secondary | ICD-10-CM | POA: Diagnosis not present

## 2017-12-26 DIAGNOSIS — Z170 Estrogen receptor positive status [ER+]: Secondary | ICD-10-CM | POA: Diagnosis present

## 2017-12-26 DIAGNOSIS — G8918 Other acute postprocedural pain: Principal | ICD-10-CM | POA: Insufficient documentation

## 2017-12-26 DIAGNOSIS — M79605 Pain in left leg: Secondary | ICD-10-CM | POA: Diagnosis present

## 2017-12-26 DIAGNOSIS — I10 Essential (primary) hypertension: Secondary | ICD-10-CM | POA: Diagnosis present

## 2017-12-26 DIAGNOSIS — M79651 Pain in right thigh: Secondary | ICD-10-CM | POA: Diagnosis not present

## 2017-12-26 DIAGNOSIS — C50419 Malignant neoplasm of upper-outer quadrant of unspecified female breast: Secondary | ICD-10-CM | POA: Diagnosis present

## 2017-12-26 DIAGNOSIS — D649 Anemia, unspecified: Secondary | ICD-10-CM | POA: Diagnosis present

## 2017-12-26 DIAGNOSIS — C50412 Malignant neoplasm of upper-outer quadrant of left female breast: Secondary | ICD-10-CM | POA: Insufficient documentation

## 2017-12-26 DIAGNOSIS — I130 Hypertensive heart and chronic kidney disease with heart failure and stage 1 through stage 4 chronic kidney disease, or unspecified chronic kidney disease: Secondary | ICD-10-CM | POA: Insufficient documentation

## 2017-12-26 DIAGNOSIS — I5189 Other ill-defined heart diseases: Secondary | ICD-10-CM | POA: Diagnosis present

## 2017-12-26 DIAGNOSIS — G039 Meningitis, unspecified: Secondary | ICD-10-CM | POA: Diagnosis not present

## 2017-12-26 DIAGNOSIS — R531 Weakness: Secondary | ICD-10-CM | POA: Diagnosis not present

## 2017-12-26 DIAGNOSIS — K219 Gastro-esophageal reflux disease without esophagitis: Secondary | ICD-10-CM | POA: Diagnosis present

## 2017-12-26 DIAGNOSIS — Z7982 Long term (current) use of aspirin: Secondary | ICD-10-CM | POA: Insufficient documentation

## 2017-12-26 DIAGNOSIS — R0602 Shortness of breath: Secondary | ICD-10-CM | POA: Diagnosis not present

## 2017-12-26 DIAGNOSIS — E871 Hypo-osmolality and hyponatremia: Secondary | ICD-10-CM | POA: Diagnosis present

## 2017-12-26 DIAGNOSIS — Z79899 Other long term (current) drug therapy: Secondary | ICD-10-CM | POA: Diagnosis not present

## 2017-12-26 HISTORY — DX: Hypo-osmolality and hyponatremia: E87.1

## 2017-12-26 LAB — CBC WITH DIFFERENTIAL/PLATELET
Basophils Absolute: 0 10*3/uL (ref 0.0–0.1)
Basophils Relative: 0 %
Eosinophils Absolute: 0.2 10*3/uL (ref 0.0–0.7)
Eosinophils Relative: 1 %
HCT: 27.4 % — ABNORMAL LOW (ref 36.0–46.0)
Hemoglobin: 9.1 g/dL — ABNORMAL LOW (ref 12.0–15.0)
Lymphocytes Relative: 14 %
Lymphs Abs: 1.8 10*3/uL (ref 0.7–4.0)
MCH: 31.9 pg (ref 26.0–34.0)
MCHC: 33.2 g/dL (ref 30.0–36.0)
MCV: 96.1 fL (ref 78.0–100.0)
Monocytes Absolute: 0.7 10*3/uL (ref 0.1–1.0)
Monocytes Relative: 5 %
Neutro Abs: 10 10*3/uL — ABNORMAL HIGH (ref 1.7–7.7)
Neutrophils Relative %: 80 %
Platelets: 196 10*3/uL (ref 150–400)
RBC: 2.85 MIL/uL — ABNORMAL LOW (ref 3.87–5.11)
RDW: 13 % (ref 11.5–15.5)
WBC: 12.7 10*3/uL — ABNORMAL HIGH (ref 4.0–10.5)

## 2017-12-26 MED ORDER — HYDROMORPHONE HCL 1 MG/ML IJ SOLN
0.5000 mg | Freq: Once | INTRAMUSCULAR | Status: AC
  Administered 2017-12-26: 0.5 mg via INTRAMUSCULAR
  Filled 2017-12-26: qty 1

## 2017-12-26 NOTE — ED Notes (Signed)
One set of blood cultures drawn and sent to lab.

## 2017-12-26 NOTE — ED Triage Notes (Signed)
Pt arrives via EMS from home with thigh pain since Saturday, had surgery for a ruptured disc on Tuesday. Pt reports taking her home hydrocodone and it made her itch so she stopped taking it. Pt received 179mcg fentanyl intranasally with EMS.

## 2017-12-26 NOTE — ED Provider Notes (Signed)
[facility] EMERGENCY DEPARTMENT Provider Note   CSN: [id number] Arrival date & time: 12/26/17  2300     History   Chief Complaint Chief Complaint  Patient presents with  . Leg Pain    bilateral    HPI [name] is a 82 y.o. female.  Patient presents by EMS with bilateral thigh pain ongoing for the past 2 days.  She was discharged from the hospital 2 days ago after undergoing L1/2 laminectomy by Dr. Ronnald Ramp.  States her pain is progressed and she left the hospital.  She complains of pain to her legs more so than her back.  She denies any numbness or tingling.  Denies any focal weakness.  No bowel or bladder incontinence.  She is able to walk with a walker with difficulty.  She was given hydrocodone for her leg pain and took about 3 of these yesterday but stopped it because she felt it was making her itch.  EMS was called tonight due to her severe pain and inability to walk.  She denies fever, chills, nausea or vomiting.  No pain with urination or blood in the urine.  No chest pain.   The history is provided by the patient and the EMS personnel.  Leg Pain   Pertinent negatives include no numbness.    Past Medical History:  Diagnosis Date  . Allergic rhinitis 10/30/2016  . Anemia   . Asthma   . Breast cancer of upper-outer quadrant of left female breast ([place]) 11/08/2013   ER/PR+ Her2- Left IDC   . Chronic renal insufficiency   . Chronic rhinitis   . Colon polyp   . Diastolic dysfunction 3/76/2831  . DJD (degenerative joint disease)   . Dyspnea   . Full dentures   . GERD (gastroesophageal reflux disease)   . Hearing loss   . Hypertension   . Impaired glucose tolerance 07/18/2014  . Memory loss   . Morbid obesity ([place])   . Poor circulation   . Vertigo   . Wears glasses     Patient Active Problem List   Diagnosis Date Noted  . S/P lumbar laminectomy 12/23/2017  . Wheezing 12/10/2017  . Bilateral knee pain 12/10/2017  . Itching 11/22/2017  .  Urinary frequency 11/22/2017  . Degenerative disc disease, lumbar 11/05/2017  . Left sided sciatica 09/04/2017  . Asthma exacerbation 08/31/2017  . Rheumatoid arthritis flare ([place]) 05/11/2017  . Acute gouty arthritis 05/01/2017  . Vertigo 10/30/2016  . Allergic rhinitis 10/30/2016  . Bowel incontinence 07/31/2016  . Hemorrhoids 07/31/2016  . Hematochezia 07/31/2016  . Chronic venous insufficiency 07/10/2016  . Diastolic dysfunction 51/76/1607  . Peripheral edema 07/10/2016  . Left ankle pain 06/02/2016  . Neck pain 03/10/2016  . Low back pain 03/10/2016  . Right cervical radiculopathy 11/13/2015  . Bradycardia 03/15/2015  . Arthralgia 12/18/2014  . Fever 11/27/2014  . Rheumatoid arthritis ([place]) 10/23/2014  . Impaired glucose tolerance 07/18/2014  . Right lumbar radiculopathy 07/18/2014  . Preventative health care 02/20/2014  . Breast cancer of upper-outer quadrant of left female breast ([place]) 11/08/2013  . Diverticulosis 07/09/2012  . Headache(784.0) 06/30/2012  . Anemia 06/29/2012  . Dyspnea 03/11/2012  . Cough 03/11/2012  . Chronic renal failure 05/18/2011  . GASTROESOPHAGEAL REFLUX DISEASE 07/05/2008  . VITAMIN D DEFICIENCY 12/19/2007  . Morbid obesity ([place]) 12/16/2007  . Essential hypertension 12/16/2007  . DEGENERATIVE JOINT DISEASE 12/16/2007    Past Surgical History:  Procedure Laterality Date  . ABDOMINAL HYSTERECTOMY    .  BREAST LUMPECTOMY WITH NEEDLE LOCALIZATION AND AXILLARY SENTINEL LYMPH NODE BX Left 12/04/2013   Procedure: BREAST LUMPECTOMY WITH NEEDLE LOCALIZATION AND AXILLARY SENTINEL LYMPH NODE BX;  Surgeon: Shann Medal, MD;  Location: [facility];  Service: General;  Laterality: Left;  . CATARACT EXTRACTION  2009   rt  . COLONOSCOPY    . EYE SURGERY Bilateral    cataract surgery  . KNEE ARTHROSCOPY     both  . LUMBAR LAMINECTOMY/DECOMPRESSION MICRODISCECTOMY Left 12/23/2017   Procedure: Left Lumbar One-Two Laminectomy with  microdiscectomy;  Surgeon: Eustace Moore, MD;  Location: [facility];  Service: Neurosurgery;  Laterality: Left;  Left L1-2 Laminectomy with microdiscectomy  . TONSILLECTOMY    . TOTAL KNEE ARTHROPLASTY  2002   rt  . TOTAL KNEE ARTHROPLASTY  2003   left  . VESICOVAGINAL FISTULA CLOSURE W/ TAH  1980    OB History    No data available       Home Medications    Prior to Admission medications   Medication Sig Start Date End Date Taking? Authorizing Provider  albuterol (PROVENTIL HFA;VENTOLIN HFA) 108 (90 Base) MCG/ACT inhaler Inhale 2 puffs every 6 (six) hours as needed into the lungs for wheezing or shortness of breath. 08/31/17   Biagio Borg, MD  amLODipine (NORVASC) 5 MG tablet Take 1 tablet (5 mg total) by mouth daily. 06/30/17   Biagio Borg, MD  aspirin 81 MG tablet Take 81 mg by mouth daily.      [provider]  budesonide-formoterol (SYMBICORT) 160-4.5 MCG/ACT inhaler Inhale 2 puffs 2 (two) times daily into the lungs. 08/31/17   Biagio Borg, MD  furosemide (LASIX) 20 MG tablet Take 1 tablet (20 mg total) by mouth daily as needed. Patient taking differently: Take 20 mg by mouth daily.  11/22/17   Biagio Borg, MD  gabapentin (NEURONTIN) 100 MG capsule Take 1 capsule (100 mg total) by mouth 3 (three) times daily. 11/29/17   Biagio Borg, MD  HYDROcodone-acetaminophen (NORCO/VICODIN) 5-325 MG tablet Take 1 tablet by mouth every 6 (six) hours as needed for moderate pain. 12/24/17   Eustace Moore, MD  losartan-hydrochlorothiazide (HYZAAR) 100-25 MG tablet Take 1 tablet by mouth daily. 11/22/17   Biagio Borg, MD  meclizine (ANTIVERT) 12.5 MG tablet TAKE 1 TABLET THREE TIMES DAILY AS NEEDED  FOR  DIZZINESS Patient taking differently: TAKE 12.5 MG THREE TIMES DAILY AS NEEDED  FOR  DIZZINESS 09/06/17   Biagio Borg, MD  Multiple Vitamins-Minerals (CENTRUM SILVER PO) Take 1 tablet by mouth daily.      [provider]  pantoprazole (PROTONIX) 40 MG tablet Take 1 tablet (40 mg  total) by mouth daily. 10/07/17   Biagio Borg, MD  Potassium Gluconate 550 (90 K) MG TABS Take 550 mg by mouth daily.  10/31/15   Magrinat, [name], MD  Simethicone (GAS-X PO) Take 1 tablet by mouth daily as needed (for gas).     [provider]  solifenacin (VESICARE) 5 MG tablet Take 1 tablet (5 mg total) by mouth daily. 11/22/17   Biagio Borg, MD  tamoxifen (NOLVADEX) 20 MG tablet Take 1 tablet (20 mg total) by mouth daily. 08/09/17   Magrinat, [name], MD  traMADol (ULTRAM) 50 MG tablet Take 1 tablet (50 mg total) by mouth every 6 (six) hours as needed. Patient taking differently: Take 50 mg by mouth every 6 (six) hours as needed for moderate pain.  12/10/17   Biagio Borg, MD  triamcinolone (NASACORT AQ) 55 MCG/ACT AERO nasal inhaler Place 2 sprays into the nose daily. 10/30/16   Biagio Borg, MD  triamcinolone cream (KENALOG) 0.1 % Apply 1 application topically 2 (two) times daily. Patient not taking: Reported on 12/21/2017 11/22/17 11/22/18  Biagio Borg, MD  Vitamin D, Ergocalciferol, (DRISDOL) 50000 units CAPS capsule Take 1 capsule (50,000 Units total) by mouth every 7 (seven) days. Patient taking differently: Take 50,000 Units by mouth every Friday.  11/05/17   Lyndal Pulley, DO    Family History Family History  Problem Relation Age of Onset  . Colon cancer Mother        in her 14's  . Stomach cancer Mother   . Heart disease Unknown        remote family  . Lung cancer Brother        was a smoker  . Heart attack Son 71    Social History Social History   Tobacco Use  . Smoking status: Never Smoker  . Smokeless tobacco: Never Used  Substance Use Topics  . Alcohol use: No    Alcohol/week: 0.0 oz  . Drug use: No     Allergies   Hydrocodone and Iron   Review of Systems Review of Systems  Constitutional: Negative for activity change, appetite change and fever.  HENT: Negative for congestion and postnasal drip.   Eyes: Negative for visual disturbance.    Respiratory: Negative for cough, chest tightness and shortness of breath.   Cardiovascular: Negative for chest pain and leg swelling.  Gastrointestinal: Negative for abdominal pain, nausea and vomiting.  Genitourinary: Negative for dysuria, hematuria, vaginal bleeding and vaginal discharge.  Musculoskeletal: Positive for arthralgias, back pain and myalgias.  Skin: Negative for rash.  Neurological: Positive for weakness. Negative for dizziness, numbness and headaches.   all other systems are negative except as noted in the HPI and PMH.     Physical Exam Updated Vital Signs BP 125/80 (BP Location: Left Arm)   Pulse 93   Temp 98.8 F (37.1 C) (Oral)   Resp 14   Ht 5' 3.75" (1.619 m)   Wt 75.8 kg (167 lb)   SpO2 99%   BMI 28.89 kg/m   Physical Exam  Constitutional: She is oriented to person, place, and time. She appears well-developed and well-nourished. No distress.  uncomfortable  HENT:  Head: Normocephalic and atraumatic.  Mouth/Throat: Oropharynx is clear and moist. No oropharyngeal exudate.  Eyes: Conjunctivae and EOM are normal. Pupils are equal, round, and reactive to light.  Neck: Normal range of motion. Neck supple.  No meningismus.  Cardiovascular: Normal rate, regular rhythm, normal heart sounds and intact distal pulses.  No murmur heard. Pulmonary/Chest: Effort normal and breath sounds normal. No respiratory distress.  Abdominal: Soft. There is no tenderness. There is no rebound and no guarding.  Musculoskeletal: She exhibits edema and tenderness.  Lumbar incision appears clean without surrounding erythema or induration. Paraspinal lumbar tenderness  Patient able to raise each leg off the bed with some discomfort.  4/5 flexion and extension of ankles with great toe extension bilaterally.  Intact DP pulses bilaterally, compartments are soft.  Bilateral knee replacements, unable to elicit patellar reflexes.  Neurological: She is alert and oriented to person, place,  and time. No cranial nerve deficit. She exhibits normal muscle tone. Coordination normal.  No ataxia on finger to nose bilaterally. No pronator drift. 5/5 strength throughout. CN 2-12 intact.Equal grip strength.  Sensation intact.   Skin: Skin is warm. Capillary refill takes less than 2 seconds.  Psychiatric: She has a normal mood and affect. Her behavior is normal.  Nursing note and vitals reviewed.    ED Treatments / Results  Labs (all labs ordered are listed, but only abnormal results are displayed) Labs Reviewed  URINALYSIS, ROUTINE W REFLEX MICROSCOPIC - Abnormal; Notable for the following components:      Result Value   Color, Urine STRAW (*)    Specific Gravity, Urine 1.004 (*)    Hgb urine dipstick SMALL (*)    Squamous Epithelial / LPF 0-5 (*)    All other components within normal limits  CBC WITH DIFFERENTIAL/PLATELET - Abnormal; Notable for the following components:   WBC 12.7 (*)    RBC 2.85 (*)    Hemoglobin 9.1 (*)    HCT 27.4 (*)    Neutro Abs 10.0 (*)    All other components within normal limits  BASIC METABOLIC PANEL - Abnormal; Notable for the following components:   Sodium 129 (*)    Chloride 91 (*)    Glucose, Bld 137 (*)    BUN 58 (*)    Creatinine, Ser 1.70 (*)    Calcium 8.8 (*)    GFR calc non Af Amer 27 (*)    GFR calc Af Amer 32 (*)    All other components within normal limits  SEDIMENTATION RATE - Abnormal; Notable for the following components:   Sed Rate 37 (*)    All other components within normal limits  C-REACTIVE PROTEIN - Abnormal; Notable for the following components:   CRP 4.0 (*)    All other components within normal limits  CK  BRAIN NATRIURETIC PEPTIDE    EKG  EKG Interpretation None       Radiology Mr Lumbar Spine W Wo Contrast  Result Date: 12/27/2017 CLINICAL DATA:  Right thigh pain.  Recent lumbar spine surgery. EXAM: MRI LUMBAR SPINE WITHOUT AND WITH CONTRAST TECHNIQUE: Multiplanar and multiecho pulse sequences of the  lumbar spine were obtained without and with intravenous contrast. CONTRAST:  17m MULTIHANCE GADOBENATE DIMEGLUMINE 529 MG/ML IV SOLN COMPARISON:  Lumbar spine MRI 11/29/2017 FINDINGS: Segmentation:  Standard. Alignment: Levoscoliosis. Vertebrae: No fracture, evidence of discitis, or bone lesion. Status post L1-2 left laminectomy. Conus medullaris and cauda equina: Conus extends to the L1 level. The cauda equina nerve roots are clustered, with minimal peripheral contrast enhancement. Paraspinal and other soft tissues: Post surgical changes in the posterior paraspinous soft tissues. Distended urinary bladder. Disc levels: T10-T11 and T11-T12 are evaluated in the sagittal plane only. There is no large disc herniation or spinal canal stenosis. T12-L1: No stenosis L1-L2: Status post left laminectomy and microdiscectomy. Greatly decreased size of disc herniation, with small amount of residual central disc material. Mild spinal canal narrowing. L2-L3: Disc bulge and facet arthrosis with unchanged moderate spinal canal stenosis. Severe right foraminal stenosis. L3-L4: Unchanged moderate to severe spinal canal stenosis and severe right foraminal stenosis. L4-L5: Severe left neural foraminal stenosis. L5-S1: Severe right neural foraminal stenosis. Visualized sacrum: Normal. IMPRESSION: 1. Clumping of the cauda equina nerve roots is suggestive of arachnoiditis. Minimal associated contrast enhancement. No epidural abscess or hematoma. 2. Marked reduction of disc herniation at the L1-2 level following microdiscectomy. 3. Otherwise unchanged multilevel severe degenerative disc disease with severe spinal canal and neural foraminal stenosis at multiple levels. Electronically Signed   By: KUlyses JarredM.D.   On: 12/27/2017 05:28   Dg Chest  Portable 1 View  Result Date: 12/27/2017 CLINICAL DATA:  Thigh pain short of breath EXAM: PORTABLE CHEST 1 VIEW COMPARISON:  12/10/2017, 09/22/2016 FINDINGS: Borderline to mild cardiomegaly.  Patchy atelectasis at the left lung base. No pleural effusion. Aortic atherosclerosis. No pneumothorax. IMPRESSION: Borderline to mild cardiomegaly with patchy atelectasis at the left lung base. Electronically Signed   By: Donavan Foil M.D.   On: 12/27/2017 00:59    Procedures Procedures (including critical care time)  Medications Ordered in ED Medications  HYDROmorphone (DILAUDID) injection 0.5 mg (not administered)     Initial Impression / Assessment and Plan / ED Course  I have reviewed the triage vital signs and the nursing notes.  Pertinent labs & imaging results that were available during my care of the patient were reviewed by me and considered in my medical decision making (see chart for details).    Patient is postop day 5 after L1/2 discectomy by Dr. Ronnald Ramp.  Coming in with increased bilateral leg pain and difficulty walking.  No bowel or bladder incontinence.  No fever.  MRI will be obtained given patient's severe leg pain and inability to ambulate.  Her inflammatory markers are reassuring though she does have a small amount of leukocytosis.  She is afebrile.  No significant midline back tenderness  MRI findings notable for arachnoiditis with improvement in disc protrusion at L1 and L2.  MRI findings d/w Dr. Sherwood Gambler of neurosurgery covering for Dr. Ronnald Ramp.  He reviewed images and feels that the arachnoiditis findings are not unexpected postoperatively.  No evidence of hematoma or infection.  Patient still unable to walk.  She has home health arranged for tomorrow.  Dr. Sherwood Gambler recommends medical admission for increased therapies and possibly rehab placement. Admission d/w Dr. Myna Hidalgo.  Final Clinical Impressions(s) / ED Diagnoses   Final diagnoses:  Postoperative pain  Gait abnormality    ED Discharge Orders    None       Elzie Knisley, Annie Main, MD 12/27/17 [phone]

## 2017-12-27 ENCOUNTER — Encounter (HOSPITAL_COMMUNITY): Payer: Self-pay | Admitting: Internal Medicine

## 2017-12-27 ENCOUNTER — Other Ambulatory Visit: Payer: Self-pay

## 2017-12-27 ENCOUNTER — Emergency Department (HOSPITAL_COMMUNITY): Payer: Medicare HMO

## 2017-12-27 DIAGNOSIS — M79651 Pain in right thigh: Secondary | ICD-10-CM | POA: Diagnosis not present

## 2017-12-27 DIAGNOSIS — R52 Pain, unspecified: Secondary | ICD-10-CM | POA: Diagnosis not present

## 2017-12-27 DIAGNOSIS — R0602 Shortness of breath: Secondary | ICD-10-CM | POA: Diagnosis not present

## 2017-12-27 DIAGNOSIS — G039 Meningitis, unspecified: Secondary | ICD-10-CM | POA: Diagnosis present

## 2017-12-27 DIAGNOSIS — E871 Hypo-osmolality and hyponatremia: Secondary | ICD-10-CM | POA: Diagnosis present

## 2017-12-27 LAB — URINALYSIS, ROUTINE W REFLEX MICROSCOPIC
Bacteria, UA: NONE SEEN
Bilirubin Urine: NEGATIVE
Glucose, UA: NEGATIVE mg/dL
Ketones, ur: NEGATIVE mg/dL
Leukocytes, UA: NEGATIVE
Nitrite: NEGATIVE
Protein, ur: NEGATIVE mg/dL
Specific Gravity, Urine: 1.004 — ABNORMAL LOW (ref 1.005–1.030)
WBC, UA: NONE SEEN WBC/hpf (ref 0–5)
pH: 7 (ref 5.0–8.0)

## 2017-12-27 LAB — BASIC METABOLIC PANEL
Anion gap: 13 (ref 5–15)
BUN: 58 mg/dL — ABNORMAL HIGH (ref 6–20)
CO2: 25 mmol/L (ref 22–32)
Calcium: 8.8 mg/dL — ABNORMAL LOW (ref 8.9–10.3)
Chloride: 91 mmol/L — ABNORMAL LOW (ref 101–111)
Creatinine, Ser: 1.7 mg/dL — ABNORMAL HIGH (ref 0.44–1.00)
GFR calc Af Amer: 32 mL/min — ABNORMAL LOW (ref >60)
GFR calc non Af Amer: 27 mL/min — ABNORMAL LOW (ref >60)
Glucose, Bld: 137 mg/dL — ABNORMAL HIGH (ref 65–99)
Potassium: 3.5 mmol/L (ref 3.5–5.1)
Sodium: 129 mmol/L — ABNORMAL LOW (ref 135–145)

## 2017-12-27 LAB — SEDIMENTATION RATE: Sed Rate: 37 mm/hr — ABNORMAL HIGH (ref 0–22)

## 2017-12-27 LAB — OSMOLALITY, URINE: Osmolality, Ur: 210 mOsm/kg — ABNORMAL LOW (ref 300–900)

## 2017-12-27 LAB — C-REACTIVE PROTEIN: CRP: 4 mg/dL — ABNORMAL HIGH (ref <1.0)

## 2017-12-27 LAB — CK: Total CK: 171 U/L (ref 38–234)

## 2017-12-27 LAB — BRAIN NATRIURETIC PEPTIDE: B Natriuretic Peptide: 35.6 pg/mL (ref 0.0–100.0)

## 2017-12-27 LAB — SODIUM, URINE, RANDOM: Sodium, Ur: 37 mmol/L

## 2017-12-27 LAB — OSMOLALITY: Osmolality: 295 mOsm/kg (ref 275–295)

## 2017-12-27 MED ORDER — DEXAMETHASONE SODIUM PHOSPHATE 10 MG/ML IJ SOLN
10.0000 mg | Freq: Once | INTRAMUSCULAR | Status: AC
  Administered 2017-12-27: 10 mg via INTRAVENOUS
  Filled 2017-12-27: qty 1

## 2017-12-27 MED ORDER — ACETAMINOPHEN 325 MG PO TABS: 650.0000 mg | ORAL_TABLET | Freq: Four times a day (QID) | ORAL | Status: DC | PRN

## 2017-12-27 MED ORDER — SODIUM CHLORIDE 0.9 % IV SOLN
INTRAVENOUS | Status: DC
  Administered 2017-12-27: 13:00:00 via INTRAVENOUS

## 2017-12-27 MED ORDER — PANTOPRAZOLE SODIUM 40 MG PO TBEC
40.0000 mg | DELAYED_RELEASE_TABLET | Freq: Every day | ORAL | Status: DC
  Administered 2017-12-27 – 2017-12-29 (×3): 40 mg via ORAL
  Filled 2017-12-27 (×4): qty 1

## 2017-12-27 MED ORDER — DARIFENACIN HYDROBROMIDE ER 7.5 MG PO TB24
7.5000 mg | ORAL_TABLET | Freq: Every day | ORAL | Status: DC
  Administered 2017-12-27 – 2017-12-29 (×3): 7.5 mg via ORAL
  Filled 2017-12-27 (×3): qty 1

## 2017-12-27 MED ORDER — ACETAMINOPHEN 650 MG RE SUPP: 650.0000 mg | Freq: Four times a day (QID) | RECTAL | Status: DC | PRN

## 2017-12-27 MED ORDER — LOSARTAN POTASSIUM 50 MG PO TABS
100.0000 mg | ORAL_TABLET | Freq: Every day | ORAL | Status: DC
  Administered 2017-12-27 – 2017-12-29 (×3): 100 mg via ORAL
  Filled 2017-12-27 (×4): qty 2

## 2017-12-27 MED ORDER — ALBUTEROL SULFATE (2.5 MG/3ML) 0.083% IN NEBU: 2.5000 mg | INHALATION_SOLUTION | Freq: Four times a day (QID) | RESPIRATORY_TRACT | Status: DC | PRN

## 2017-12-27 MED ORDER — HYDROMORPHONE HCL 1 MG/ML IJ SOLN
0.5000 mg | Freq: Once | INTRAMUSCULAR | Status: AC
  Administered 2017-12-27: 0.5 mg via INTRAVENOUS
  Filled 2017-12-27: qty 1

## 2017-12-27 MED ORDER — TRIAMCINOLONE ACETONIDE 55 MCG/ACT NA AERO
2.0000 | INHALATION_SPRAY | Freq: Every day | NASAL | Status: DC
  Administered 2017-12-27 – 2017-12-29 (×3): 2 via NASAL
  Filled 2017-12-27: qty 21.6

## 2017-12-27 MED ORDER — ONDANSETRON HCL 4 MG/2ML IJ SOLN: 4.0000 mg | Freq: Four times a day (QID) | INTRAMUSCULAR | Status: DC | PRN

## 2017-12-27 MED ORDER — TAMOXIFEN CITRATE 10 MG PO TABS
20.0000 mg | ORAL_TABLET | Freq: Every day | ORAL | Status: DC
  Administered 2017-12-27 – 2017-12-29 (×3): 20 mg via ORAL
  Filled 2017-12-27 (×3): qty 2

## 2017-12-27 MED ORDER — HYDROCODONE-ACETAMINOPHEN 5-325 MG PO TABS: 1.0000 | ORAL_TABLET | ORAL | Status: DC | PRN

## 2017-12-27 MED ORDER — GADOBENATE DIMEGLUMINE 529 MG/ML IV SOLN
7.0000 mL | Freq: Once | INTRAVENOUS | Status: AC | PRN
  Administered 2017-12-27: 7 mL via INTRAVENOUS

## 2017-12-27 MED ORDER — LOSARTAN POTASSIUM-HCTZ 100-25 MG PO TABS: 1.0000 | ORAL_TABLET | Freq: Every day | ORAL | Status: DC

## 2017-12-27 MED ORDER — AMLODIPINE BESYLATE 5 MG PO TABS
5.0000 mg | ORAL_TABLET | Freq: Every day | ORAL | Status: DC
  Administered 2017-12-27 – 2017-12-29 (×3): 5 mg via ORAL
  Filled 2017-12-27 (×4): qty 1

## 2017-12-27 MED ORDER — GABAPENTIN 100 MG PO CAPS
100.0000 mg | ORAL_CAPSULE | Freq: Three times a day (TID) | ORAL | Status: DC
  Administered 2017-12-27 – 2017-12-29 (×7): 100 mg via ORAL
  Filled 2017-12-27 (×7): qty 1

## 2017-12-27 MED ORDER — MOMETASONE FURO-FORMOTEROL FUM 200-5 MCG/ACT IN AERO
2.0000 | INHALATION_SPRAY | Freq: Two times a day (BID) | RESPIRATORY_TRACT | Status: DC
  Administered 2017-12-27 – 2017-12-29 (×5): 2 via RESPIRATORY_TRACT
  Filled 2017-12-27: qty 8.8

## 2017-12-27 MED ORDER — TRAMADOL HCL 50 MG PO TABS
50.0000 mg | ORAL_TABLET | Freq: Four times a day (QID) | ORAL | Status: DC | PRN
  Administered 2017-12-27 – 2017-12-28 (×3): 50 mg via ORAL
  Filled 2017-12-27 (×3): qty 1

## 2017-12-27 MED ORDER — DEXAMETHASONE 4 MG PO TABS
4.0000 mg | ORAL_TABLET | Freq: Three times a day (TID) | ORAL | Status: AC
  Administered 2017-12-28 (×2): 4 mg via ORAL
  Filled 2017-12-27 (×2): qty 1

## 2017-12-27 MED ORDER — ASPIRIN EC 81 MG PO TBEC
81.0000 mg | DELAYED_RELEASE_TABLET | Freq: Every day | ORAL | Status: DC
  Administered 2017-12-27 – 2017-12-29 (×3): 81 mg via ORAL
  Filled 2017-12-27 (×4): qty 1

## 2017-12-27 MED ORDER — HYDROCERIN EX CREA
TOPICAL_CREAM | Freq: Three times a day (TID) | CUTANEOUS | Status: DC
  Administered 2017-12-27 – 2017-12-29 (×6): via TOPICAL
  Filled 2017-12-27: qty 113

## 2017-12-27 MED ORDER — HYDROMORPHONE HCL 1 MG/ML IJ SOLN: 0.5000 mg | INTRAMUSCULAR | Status: DC | PRN

## 2017-12-27 MED ORDER — HYDROCODONE-ACETAMINOPHEN 5-325 MG PO TABS
1.0000 | ORAL_TABLET | Freq: Four times a day (QID) | ORAL | Status: DC | PRN
  Administered 2017-12-27: 1 via ORAL
  Filled 2017-12-27: qty 1

## 2017-12-27 MED ORDER — METHOCARBAMOL 750 MG PO TABS
750.0000 mg | ORAL_TABLET | Freq: Three times a day (TID) | ORAL | Status: AC
  Administered 2017-12-27 (×3): 750 mg via ORAL
  Filled 2017-12-27 (×3): qty 1

## 2017-12-27 MED ORDER — POTASSIUM GLUCONATE 550 (90 K) MG PO TABS: 550.0000 mg | ORAL_TABLET | Freq: Every day | ORAL | Status: DC

## 2017-12-27 MED ORDER — DIPHENHYDRAMINE HCL 25 MG PO CAPS
25.0000 mg | ORAL_CAPSULE | Freq: Four times a day (QID) | ORAL | Status: DC | PRN
  Administered 2017-12-27: 25 mg via ORAL
  Filled 2017-12-27: qty 1

## 2017-12-27 MED ORDER — ONDANSETRON HCL 4 MG PO TABS: 4.0000 mg | ORAL_TABLET | Freq: Four times a day (QID) | ORAL | Status: DC | PRN

## 2017-12-27 MED ORDER — HYDROMORPHONE HCL 1 MG/ML IJ SOLN
0.5000 mg | INTRAMUSCULAR | Status: DC | PRN
Start: 2017-12-27 — End: 2017-12-29
  Administered 2017-12-27: 0.5 mg via INTRAVENOUS
  Filled 2017-12-27: qty 0.5

## 2017-12-27 MED ORDER — HYDROCHLOROTHIAZIDE 25 MG PO TABS
25.0000 mg | ORAL_TABLET | Freq: Every day | ORAL | Status: DC
  Administered 2017-12-27 – 2017-12-29 (×3): 25 mg via ORAL
  Filled 2017-12-27 (×4): qty 1

## 2017-12-27 MED ORDER — FUROSEMIDE 20 MG PO TABS
20.0000 mg | ORAL_TABLET | Freq: Every day | ORAL | Status: DC
  Administered 2017-12-27 – 2017-12-29 (×3): 20 mg via ORAL
  Filled 2017-12-27 (×4): qty 1

## 2017-12-27 MED ORDER — HEPARIN SODIUM (PORCINE) 5000 UNIT/ML IJ SOLN
5000.0000 [IU] | Freq: Three times a day (TID) | INTRAMUSCULAR | Status: DC
  Administered 2017-12-27 – 2017-12-29 (×6): 5000 [IU] via SUBCUTANEOUS
  Filled 2017-12-27 (×6): qty 1

## 2017-12-27 MED ORDER — MECLIZINE HCL 25 MG PO TABS
12.5000 mg | ORAL_TABLET | Freq: Two times a day (BID) | ORAL | Status: DC | PRN
  Filled 2017-12-27: qty 0.5

## 2017-12-27 NOTE — ED Notes (Signed)
Attempted report 

## 2017-12-27 NOTE — ED Notes (Signed)
Pt does not feel as if she can ambulate at this time d/t pain.

## 2017-12-27 NOTE — ED Notes (Signed)
Patient transported to MRI 

## 2017-12-27 NOTE — H&P (Signed)
History and Physical    [name] HMC:[id number] DOB: 21-Aug-1936 DOA: 12/26/2017  PCP: Biagio Borg, MD Patient coming from: home  Chief Complaint: back pain  HPI: [name] is a 82 y.o. female with medical history significant for anemia, breast cancer, chronic kidney disease, diastolic dysfunction, GERD, hypertension since in the emergency department chief complaint back pain and right thigh pain. Patient recent laminectomy intractable back pain/inability to move. [facility] are asked to admit  Information is obtained by the patient. Days ago she underwent lumbar laminectomy/discectomy at L1-2. He reports her pain was initially controlled when she left the hospital. Wilburn Mylar it became worse. She developed pain to her legs particularly her right thigh. She denies any numbness or tingling of her lower extremities. She denies any weakness of her lower extremities. There is been no bowel or bladder incontinence. She states that she was able to walk with much difficulty walker. She's only taken 3 pain pills as it makes her itch. She describes the pain as a "spasm". Worse with movement, coughing. She called EMS last night as the pain progressed to the point she was unable to walk. She denies any headache dizziness syncope or near-syncope. She denies any fever chills nausea vomiting. She denies any abdominal pain diarrhea constipation melena bright red blood per rectum. She denies dysuria hematuria frequency or urgency. She denies chest pain palpitations lower extremity edema. She denies any cough shortness of breath    ED Course: In the emergency department she's afebrile hemodynamically stable and not hypoxic provided with Dilaudid  Review of Systems: As per HPI otherwise all other systems reviewed and are negative.   Ambulatory Status: She walks with a cane no recent falls  Past Medical History:  Diagnosis Date  . Allergic rhinitis 10/30/2016  . Anemia   . Asthma     . Breast cancer of upper-outer quadrant of left female breast ([place]) 11/08/2013   ER/PR+ Her2- Left IDC   . Chronic renal insufficiency   . Chronic rhinitis   . Colon polyp   . Diastolic dysfunction 6/62/9476  . DJD (degenerative joint disease)   . Dyspnea   . Full dentures   . GERD (gastroesophageal reflux disease)   . Hearing loss   . Hypertension   . Impaired glucose tolerance 07/18/2014  . Memory loss   . Morbid obesity ([place])   . Poor circulation   . Vertigo   . Wears glasses     Past Surgical History:  Procedure Laterality Date  . ABDOMINAL HYSTERECTOMY    . BREAST LUMPECTOMY WITH NEEDLE LOCALIZATION AND AXILLARY SENTINEL LYMPH NODE BX Left 12/04/2013   Procedure: BREAST LUMPECTOMY WITH NEEDLE LOCALIZATION AND AXILLARY SENTINEL LYMPH NODE BX;  Surgeon: Shann Medal, MD;  Location: [facility];  Service: General;  Laterality: Left;  . CATARACT EXTRACTION  2009   rt  . COLONOSCOPY    . EYE SURGERY Bilateral    cataract surgery  . KNEE ARTHROSCOPY     both  . LUMBAR LAMINECTOMY/DECOMPRESSION MICRODISCECTOMY Left 12/23/2017   Procedure: Left Lumbar One-Two Laminectomy with microdiscectomy;  Surgeon: Eustace Moore, MD;  Location: [facility];  Service: Neurosurgery;  Laterality: Left;  Left L1-2 Laminectomy with microdiscectomy  . TONSILLECTOMY    . TOTAL KNEE ARTHROPLASTY  2002   rt  . TOTAL KNEE ARTHROPLASTY  2003   left  . VESICOVAGINAL FISTULA CLOSURE W/ TAH  1980    Social History   Socioeconomic History  .  Marital status: Widowed    Spouse name: Not on file  . Number of children: 2  . Years of education: Not on file  . Highest education level: Not on file  Social Needs  . Financial resource strain: Not on file  . Food insecurity - worry: Not on file  . Food insecurity - inability: Not on file  . Transportation needs - medical: Not on file  . Transportation needs - non-medical: Not on file  Occupational History  . 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 Smoking status: Never Smoker  . Smokeless tobacco: Never Used  Substance and Sexual Activity  . Alcohol use: No    Alcohol/week: 0.0 oz  . Drug use: No  . Sexual activity: Not Currently  Other Topics Concern  . Not on file  Social History Narrative  . Not on file    Allergies  Allergen Reactions  . Hydrocodone Itching  . Iron Hives and Other (See Comments)    [place], bad constipation    Family History  Problem Relation Age of Onset  . Colon cancer Mother        in her 77's  . Stomach cancer Mother   . Heart disease Unknown        remote family  . Lung cancer Brother        was a smoker  . Heart attack Son 69    Prior to Admission medications   Medication Sig Start Date End Date Taking? Authorizing Provider  albuterol (PROVENTIL HFA;VENTOLIN HFA) 108 (90 Base) MCG/ACT inhaler Inhale 2 puffs every 6 (six) hours as needed into the lungs for wheezing or shortness of breath. 08/31/17  Yes Biagio Borg, MD  amLODipine (NORVASC) 5 MG tablet Take 1 tablet (5 mg total) by mouth daily. 06/30/17  Yes Biagio Borg, MD  aspirin 81 MG tablet Take 81 mg by mouth daily.     Yes [provider]  budesonide-formoterol (SYMBICORT) 160-4.5 MCG/ACT inhaler Inhale 2 puffs 2 (two) times daily into the lungs. 08/31/17  Yes Biagio Borg, MD  furosemide (LASIX) 20 MG tablet Take 1 tablet (20 mg total) by mouth daily as needed. Patient taking differently: Take 20 mg by mouth daily.  11/22/17  Yes Biagio Borg, MD  gabapentin (NEURONTIN) 100 MG capsule Take 1 capsule (100 mg total) by mouth 3 (three) times daily. 11/29/17  Yes Biagio Borg, MD  HYDROcodone-acetaminophen (NORCO/VICODIN) 5-325 MG tablet Take 1 tablet by mouth every 6 (six) hours as needed for moderate pain. 12/24/17  Yes Eustace Moore, MD  losartan-hydrochlorothiazide (HYZAAR) 100-25 MG tablet Take 1 tablet by mouth daily. 11/22/17  Yes Biagio Borg, MD  meclizine (ANTIVERT) 12.5 MG tablet  TAKE 1 TABLET THREE TIMES DAILY AS NEEDED  FOR  DIZZINESS Patient taking differently: TAKE 12.5 MG THREE TIMES DAILY AS NEEDED  FOR  DIZZINESS 09/06/17  Yes Biagio Borg, MD  Multiple Vitamins-Minerals (CENTRUM SILVER PO) Take 1 tablet by mouth daily.     Yes [provider]  pantoprazole (PROTONIX) 40 MG tablet Take 1 tablet (40 mg total) by mouth daily. 10/07/17  Yes Biagio Borg, MD  Potassium Gluconate 550 (90 K) MG TABS Take 550 mg by mouth daily.  10/31/15  Yes Magrinat, [name], MD  Simethicone (GAS-X PO) Take 1 tablet by mouth daily as needed (for gas).    Yes [provider]  solifenacin (VESICARE) 5  MG tablet Take 1 tablet (5 mg total) by mouth daily. 11/22/17  Yes Biagio Borg, MD  tamoxifen (NOLVADEX) 20 MG tablet Take 1 tablet (20 mg total) by mouth daily. 08/09/17  Yes Magrinat, [name], MD  traMADol (ULTRAM) 50 MG tablet Take 1 tablet (50 mg total) by mouth every 6 (six) hours as needed. Patient taking differently: Take 50 mg by mouth every 6 (six) hours as needed for moderate pain.  12/10/17  Yes Biagio Borg, MD  triamcinolone (NASACORT AQ) 55 MCG/ACT AERO nasal inhaler Place 2 sprays into the nose daily. 10/30/16  Yes Biagio Borg, MD  Vitamin D, Ergocalciferol, (DRISDOL) 50000 units CAPS capsule Take 1 capsule (50,000 Units total) by mouth every 7 (seven) days. Patient taking differently: Take 50,000 Units by mouth every 7 (seven) days. On Friday 11/05/17  Yes Lyndal Pulley, DO  triamcinolone cream (KENALOG) 0.1 % Apply 1 application topically 2 (two) times daily. Patient not taking: Reported on 12/21/2017 11/22/17 11/22/18  Biagio Borg, MD    Physical Exam: Vitals:   12/27/17 0630 12/27/17 0645 12/27/17 0745 12/27/17 0827  BP: 114/69 119/73 112/63 140/70  Pulse: 77 76 76 81  Resp: _0 Temp:    98.7 F (37.1 C)  TempSrc:    Oral  SpO2: 98% 100% 99% 99%  Weight:      Height:         General:  Appears calm and slightly uncomfortable during  exam. Slightly lethargic Eyes:  PERRL, EOMI, normal lids, iris ENT:  grossly normal hearing, lips & tongue, mucous membranes of her mouth are pink slightly dry Neck:  no LAD, masses or thyromegaly Cardiovascular:  RRR, no m/r/g. No LE edema. Pedal pulses present and palpable Respiratory:  CTA bilaterally, no w/r/r. Normal respiratory effort. Abdomen:  soft, ntnd, positive bowel sounds no guarding or rebounding Skin:  no rash or induration seen on limited exam incision to lumbar spine clean and dry Musculoskeletal:  grossly normal tone BUE/BLE, good ROM, no bony abnormality Psychiatric:  grossly normal mood and affect, speech fluent and appropriate, AOx3 Neurologic:  CN 2-12 grossly intact, moves all extremities in coordinated fashion, sensation intact speech clear facial symmetry is all extremities.  Labs on Admission: I have personally reviewed following labs and imaging studies  CBC: Recent Labs  Lab 12/23/17 0857 12/26/17 2317  WBC 10.7* 12.7*  NEUTROABS  --  10.0*  HGB 10.7* 9.1*  HCT 33.1* 27.4*  MCV 97.1 96.1  PLT 195 732   Basic Metabolic Panel: Recent Labs  Lab 12/23/17 0857 12/26/17 2317  NA 132* 129*  K 3.2* 3.5  CL 92* 91*  CO2 24 25  GLUCOSE 100* 137*  BUN 55* 58*  CREATININE 1.93* 1.70*  CALCIUM 9.1 8.8*   GFR: Estimated Creatinine Clearance: 26.2 mL/min (A) (by C-G formula based on SCr of 1.7 mg/dL (H)). Liver Function Tests: No results for input(s): AST, ALT, ALKPHOS, BILITOT, PROT, ALBUMIN in the last 168 hours. No results for input(s): LIPASE, AMYLASE in the last 168 hours. No results for input(s): AMMONIA in the last 168 hours. Coagulation Profile: No results for input(s): INR, PROTIME in the last 168 hours. Cardiac Enzymes: Recent Labs  Lab 12/26/17 2317  CKTOTAL 171   BNP (last 3 results) No results for input(s): PROBNP in the last 8760 hours. HbA1C: No results for input(s): HGBA1C in the last 72 hours. CBG: No results for input(s):  GLUCAP in the last 168 hours.  Lipid Profile: No results for input(s): CHOL, HDL, LDLCALC, TRIG, CHOLHDL, LDLDIRECT in the last 72 hours. Thyroid Function Tests: No results for input(s): TSH, T4TOTAL, FREET4, T3FREE, THYROIDAB in the last 72 hours. Anemia Panel: No results for input(s): VITAMINB12, FOLATE, FERRITIN, TIBC, IRON, RETICCTPCT in the last 72 hours. Urine analysis:    Component Value Date/Time   COLORURINE STRAW (A) 12/26/2017 2317   APPEARANCEUR CLEAR 12/26/2017 2317   LABSPEC 1.004 (L) 12/26/2017 2317   PHURINE 7.0 12/26/2017 2317   GLUCOSEU NEGATIVE 12/26/2017 2317   GLUCOSEU NEGATIVE 11/25/2015 1633   HGBUR SMALL (A) 12/26/2017 2317   BILIRUBINUR NEGATIVE 12/26/2017 2317   KETONESUR NEGATIVE 12/26/2017 2317   PROTEINUR NEGATIVE 12/26/2017 2317   UROBILINOGEN 0.2 11/25/2015 1633   NITRITE NEGATIVE 12/26/2017 2317   LEUKOCYTESUR NEGATIVE 12/26/2017 2317    Creatinine Clearance: Estimated Creatinine Clearance: 26.2 mL/min (A) (by C-G formula based on SCr of 1.7 mg/dL (H)).  Sepsis Labs: _0 (procalcitonin:4,lacticidven:4) ) Recent Results (from the past 240 hour(s))  Surgical pcr screen     Status: None   Collection Time: 12/23/17  8:57 AM  Result Value Ref Range Status   MRSA, PCR NEGATIVE NEGATIVE Final   Staphylococcus aureus NEGATIVE NEGATIVE Final    Comment: (NOTE) The Xpert SA Assay (FDA approved for NASAL specimens in patients 5 years of age and older), is one component of a comprehensive surveillance program. It is not intended to diagnose infection nor to guide or monitor treatment. Performed at [facility], [place] [address], [place], [place] [address]      Radiological Exams on Admission: Mr Lumbar Spine W Wo Contrast  Result Date: 12/27/2017 CLINICAL DATA:  Right thigh pain.  Recent lumbar spine surgery. EXAM: MRI LUMBAR SPINE WITHOUT AND WITH CONTRAST TECHNIQUE: Multiplanar and multiecho pulse sequences of the lumbar spine were  obtained without and with intravenous contrast. CONTRAST:  70m MULTIHANCE GADOBENATE DIMEGLUMINE 529 MG/ML IV SOLN COMPARISON:  Lumbar spine MRI 11/29/2017 FINDINGS: Segmentation:  Standard. Alignment: Levoscoliosis. Vertebrae: No fracture, evidence of discitis, or bone lesion. Status post L1-2 left laminectomy. Conus medullaris and cauda equina: Conus extends to the L1 level. The cauda equina nerve roots are clustered, with minimal peripheral contrast enhancement. Paraspinal and other soft tissues: Post surgical changes in the posterior paraspinous soft tissues. Distended urinary bladder. Disc levels: T10-T11 and T11-T12 are evaluated in the sagittal plane only. There is no large disc herniation or spinal canal stenosis. T12-L1: No stenosis L1-L2: Status post left laminectomy and microdiscectomy. Greatly decreased size of disc herniation, with small amount of residual central disc material. Mild spinal canal narrowing. L2-L3: Disc bulge and facet arthrosis with unchanged moderate spinal canal stenosis. Severe right foraminal stenosis. L3-L4: Unchanged moderate to severe spinal canal stenosis and severe right foraminal stenosis. L4-L5: Severe left neural foraminal stenosis. L5-S1: Severe right neural foraminal stenosis. Visualized sacrum: Normal. IMPRESSION: 1. Clumping of the cauda equina nerve roots is suggestive of arachnoiditis. Minimal associated contrast enhancement. No epidural abscess or hematoma. 2. Marked reduction of disc herniation at the L1-2 level following microdiscectomy. 3. Otherwise unchanged multilevel severe degenerative disc disease with severe spinal canal and neural foraminal stenosis at multiple levels. Electronically Signed   By: KUlyses JarredM.D.   On: 12/27/2017 05:28   Dg Chest Portable 1 View  Result Date: 12/27/2017 CLINICAL DATA:  Thigh pain short of breath EXAM: PORTABLE CHEST 1 VIEW COMPARISON:  12/10/2017, 09/22/2016 FINDINGS: Borderline to mild cardiomegaly. Patchy atelectasis  at the left lung  base. No pleural effusion. Aortic atherosclerosis. No pneumothorax. IMPRESSION: Borderline to mild cardiomegaly with patchy atelectasis at the left lung base. Electronically Signed   By: Donavan Foil M.D.   On: 12/27/2017 00:59    EKG:   Assessment/Plan Principal Problem:   Intractable pain Active Problems:   Morbid obesity ([place])   Essential hypertension   GASTROESOPHAGEAL REFLUX DISEASE   Chronic renal failure   Anemia   Breast cancer of upper-outer quadrant of left female breast ([facility])   Chronic venous insufficiency   Diastolic dysfunction   Arachnoiditis   Hyponatremia   1. Intractable back pain. Patient underwent a lumbar laminectomy/discectomy 3 days ago. She's not been taken her pain medicine due to side effects. She describes a "spasm" like pain. MRI reveals arachnoiditis with improvement in disc protrusion at L1 and L2. Review indicates MRI findings discussed with Dr. Lucia Gaskins of neurosurgery who opined findings not unexpected postoperatively. MRI reveals no evidence of hematoma or infection. Sedimentation rate and C-reactive protein elevated. Dr. Lucia Gaskins recommended medical admission for increased therapies and possible rehabilitation placement -Admit to MedSurg -Analgesia -Benadryl for itching -Robaxin for spasms -mobilize -Physical therapy -Social work  #2. Hypertension. Fair control in the emergency department. Home meds include amlodipine, Lasix, losartan, hydrochlorothiazide -Continue home meds -Monitor  #3. Chronic diastolic dysfunction. Echo 3 years ago reveals ejection fraction is 64% grade 1 diastolic dysfunction. Home medications include Lasix. She does not appear volume overloaded. -Continue home meds  #4. Chronic kidney disease stage III. Creatinine 1.7 on admission. Last documented creatinine 1.42 years ago. May be indication of progression -Gentle IV fluids -Hold nephrotoxins -Monitor urine output -Recheck  #5. History of breast cancer.  2015. -Continue home meds  #6. Hyponatremia. likely related to recent surgery decreased oral intake. Sodium level 129 -Gentle IV fluids -Serum osmolality -Urine osmolality -Recheck in the morning     DVT prophylaxis: heparin Code Status: full  Family Communication: none present  Disposition Plan: may benefit rehab  Consults called: dr Rita Ohara per ED provider  Admission status: obs    Radene Gunning MD [facility]  If 7PM-7AM, please contact night-coverage [email or web address] Password [email or web address]  12/27/2017, 10:11 AM

## 2017-12-27 NOTE — Progress Notes (Signed)
Physical Therapy Treatment Patient Details Name: [name] MRN: [id number] DOB: 13-Jun-1936 Today's Date: 12/27/2017    History of Present Illness Patient is a 82 y/o female who is s/p L1-2 lami and discectomy 2/28 presents with Bil thigh pain and inability to walk. MRI lumbar spine- arachnoiditis. PMH includes breast ca s/p lumpectomy, HTN, vertigo, asthma.    PT Comments    Patient progressing well in afternoon session. Tolerated gait training with Min guard assist for balance/safety. Continues to have BLE spasms impacting safe mobility. Increased time to perform tasks but only requires Min A. Daughter able to assist at home per patient. Reviewed back precautions. Pt needs to negotiate stairs prior to d/c; will plan to perform tomorrow. Will follow.    Follow Up Recommendations  Home health PT;Supervision for mobility/OOB     Equipment Recommendations  None recommended by PT    Recommendations for Other Services       Precautions / Restrictions Precautions Precautions: Back;Fall Precaution Booklet Issued: No Precaution Comments: Pt able to recall 2/3 precautions Restrictions Weight Bearing Restrictions: No    Mobility  Bed Mobility Overal bed mobility: Needs Assistance Bed Mobility: Sit to Sidelying Rolling: Min guard Sidelying to sit: Min assist;HOB elevated     Sit to sidelying: Min assist;HOB elevated General bed mobility comments: Assist to bring LLE into bed to return to supine. Increased time and effort.   Transfers Overall transfer level: Needs assistance Equipment used: Rolling walker (2 wheeled) Transfers: Sit to/from Stand Sit to Stand: Min guard         General transfer comment: Min guard to steady in standing, Stood from chair x1, from raised toilet x1. Cues for hand placement/technique, increased time.  Ambulation/Gait Ambulation/Gait assistance: Min guard Ambulation Distance (Feet): 16 Feet(x2 bouts) Assistive device: Rolling walker (2  wheeled) Gait Pattern/deviations: Decreased stride length;Shuffle;Step-through pattern;Trunk flexed Gait velocity: Decreased   General Gait Details: Slow, guarded and mildly unsteady gait using RW. Spasms in BLEs during mobility.    Stairs            Wheelchair Mobility    Modified Rankin (Stroke Patients Only)       Balance Overall balance assessment: Needs assistance Sitting-balance support: Feet supported;No upper extremity supported Sitting balance-Leahy Scale: Fair Sitting balance - Comments: Requires BUE support as position of comfort due to BLE spasms.   Standing balance support: During functional activity Standing balance-Leahy Scale: Fair Standing balance comment: Able to stand at sink and wash hands with close Min guard.                             Cognition Arousal/Alertness: Awake/alert Behavior During Therapy: WFL for tasks assessed/performed Overall Cognitive Status: Within Functional Limits for tasks assessed                                        Exercises      General Comments General comments (skin integrity, edema, etc.): Granddaughter present during session.      Pertinent Vitals/Pain Pain Assessment: Faces Faces Pain Scale: Hurts little more Pain Location: BLEs Pain Descriptors / Indicators: Grimacing;Sharp;Spasm Pain Intervention(s): Monitored during session;Repositioned;Limited activity within patient's tolerance    Home Living Family/patient expects to be discharged to:: Private residence Living Arrangements: Children Available Help at Discharge: Family;Available 24 hours/day Type of Home: House Home Access: Stairs to enter Entrance  Stairs-Rails: None Home Layout: One level Home Equipment: Walker - 2 wheels;Bedside commode;Cane - single point;Shower seat;Adaptive equipment      Prior Function Level of Independence: Needs assistance  Gait / Transfers Assistance Needed: Since arriving at home, pt has  needed increased assist with mobility, standing etc.        PT Goals (current goals can now be found in the care plan section) Acute Rehab PT Goals Patient Stated Goal: to decrease pain and return home PT Goal Formulation: With patient Time For Goal Achievement: 01/10/18 Potential to Achieve Goals: Good Progress towards PT goals: Progressing toward goals    Frequency    Min 3X/week      PT Plan Current plan remains appropriate    Co-evaluation              AM-PAC PT "6 Clicks" Daily Activity  Outcome Measure  Difficulty turning over in bed (including adjusting bedclothes, sheets and blankets)?: None Difficulty moving from lying on back to sitting on the side of the bed? : Unable Difficulty sitting down on and standing up from a chair with arms (e.g., wheelchair, bedside commode, etc,.)?: A Little Help needed moving to and from a bed to chair (including a wheelchair)?: A Little Help needed walking in hospital room?: A Little Help needed climbing 3-5 steps with a railing? : A Little 6 Click Score: 17    End of Session Equipment Utilized During Treatment: Gait belt Activity Tolerance: Patient tolerated treatment well Patient left: in bed;with call bell/phone within reach;with bed alarm set Nurse Communication: Mobility status PT Visit Diagnosis: Pain;Difficulty in walking, not elsewhere classified (R26.2);Other abnormalities of gait and mobility (R26.89) Pain - part of body: (BLEs)     Time: 1425-1450 PT Time Calculation (min) (ACUTE ONLY): 25 min  Charges:  $Gait Training: 8-22 mins $Therapeutic Activity: 8-22 mins                    G Codes:       Wray Kearns, PT, DPT [phone]     Marguarite Arbour A Rashard Ryle 12/27/2017, 3:22 PM

## 2017-12-27 NOTE — Progress Notes (Addendum)
Subjective: Patient reports mild back pain with some left leg pain and weakness that has improved since admission.  Objective: Vital signs in last 24 hours: Temp:  [98.6 F (37 C)-99.1 F (37.3 C)] 98.6 F (37 C) (03/04 1618) Pulse Rate:  [76-93] 77 (03/04 1618) Resp:  [11-19] 17 (03/04 1618) BP: (100-146)/(45-80) 123/45 (03/04 1618) SpO2:  [89 %-100 %] 100 % (03/04 1618) Weight:  [75.8 kg (167 lb)] 75.8 kg (167 lb) (03/03 2305)  Intake/Output from previous day: 03/03 0701 - 03/04 0700 In: -  Out: 150 [Urine:150] Intake/Output this shift: No intake/output data recorded.  Neurologic: Grossly normal. Some LLE weakness 4/5 strength  Lab Results: Lab Results  Component Value Date   WBC 12.7 (H) 12/26/2017   HGB 9.1 (L) 12/26/2017   HCT 27.4 (L) 12/26/2017   MCV 96.1 12/26/2017   PLT 196 12/26/2017   No results found for: INR, PROTIME BMET Lab Results  Component Value Date   NA 129 (L) 12/26/2017   K 3.5 12/26/2017   CL 91 (L) 12/26/2017   CO2 25 12/26/2017   GLUCOSE 137 (H) 12/26/2017   BUN 58 (H) 12/26/2017   CREATININE 1.70 (H) 12/26/2017   CALCIUM 8.8 (L) 12/26/2017    Studies/Results: Mr Lumbar Spine W Wo Contrast  Result Date: 12/27/2017 CLINICAL DATA:  Right thigh pain.  Recent lumbar spine surgery. EXAM: MRI LUMBAR SPINE WITHOUT AND WITH CONTRAST TECHNIQUE: Multiplanar and multiecho pulse sequences of the lumbar spine were obtained without and with intravenous contrast. CONTRAST:  42mL MULTIHANCE GADOBENATE DIMEGLUMINE 529 MG/ML IV SOLN COMPARISON:  Lumbar spine MRI 11/29/2017 FINDINGS: Segmentation:  Standard. Alignment: Levoscoliosis. Vertebrae: No fracture, evidence of discitis, or bone lesion. Status post L1-2 left laminectomy. Conus medullaris and cauda equina: Conus extends to the L1 level. The cauda equina nerve roots are clustered, with minimal peripheral contrast enhancement. Paraspinal and other soft tissues: Post surgical changes in the posterior  paraspinous soft tissues. Distended urinary bladder. Disc levels: T10-T11 and T11-T12 are evaluated in the sagittal plane only. There is no large disc herniation or spinal canal stenosis. T12-L1: No stenosis L1-L2: Status post left laminectomy and microdiscectomy. Greatly decreased size of disc herniation, with small amount of residual central disc material. Mild spinal canal narrowing. L2-L3: Disc bulge and facet arthrosis with unchanged moderate spinal canal stenosis. Severe right foraminal stenosis. L3-L4: Unchanged moderate to severe spinal canal stenosis and severe right foraminal stenosis. L4-L5: Severe left neural foraminal stenosis. L5-S1: Severe right neural foraminal stenosis. Visualized sacrum: Normal. IMPRESSION: 1. Clumping of the cauda equina nerve roots is suggestive of arachnoiditis. Minimal associated contrast enhancement. No epidural abscess or hematoma. 2. Marked reduction of disc herniation at the L1-2 level following microdiscectomy. 3. Otherwise unchanged multilevel severe degenerative disc disease with severe spinal canal and neural foraminal stenosis at multiple levels. Electronically Signed   By: Ulyses Jarred M.D.   On: 12/27/2017 05:28   Dg Chest Portable 1 View  Result Date: 12/27/2017 CLINICAL DATA:  Thigh pain short of breath EXAM: PORTABLE CHEST 1 VIEW COMPARISON:  12/10/2017, 09/22/2016 FINDINGS: Borderline to mild cardiomegaly. Patchy atelectasis at the left lung base. No pleural effusion. Aortic atherosclerosis. No pneumothorax. IMPRESSION: Borderline to mild cardiomegaly with patchy atelectasis at the left lung base. Electronically Signed   By: Donavan Foil M.D.   On: 12/27/2017 00:59    Assessment/Plan: Status post L1-2 microdisc on 2/28. Patient develop severe back and leg pain on Saturday and had a difficult time walking. MRI reviewed- no  abnormalities other than typical postop changes. Will order decadron. Ok to d/c from nsgy perspective after pain is under control.  Would suggest sending her home on a medrol dose pack   LOS: 0 days    [name] 12/27/2017, 4:52 PM

## 2017-12-27 NOTE — Evaluation (Signed)
Physical Therapy Evaluation Patient Details Name: [name] MRN: [id number] DOB: 06/13/1936 Today's Date: 12/27/2017   History of Present Illness  Patient is a 82 y/o female who is s/p L1-2 lami and discectomy 2/28 presents with Bil thigh pain and inability to walk. MRI lumbar spine- arachnoiditis. PMH includes breast ca s/p lumpectomy, HTN, vertigo, asthma.  Clinical Impression  Patient presents with pain/spasms in BLEs, itching, weakness and impaired mobility s/p above. Pt initially doing well after spine surgery but stopped taking pain meds at home due to side effects ofitchiness and swelling resulting in increased pain and decreased mobility. Pt plans to d/c home with daughter who can assist as needed. Pt does not want to go SNF. Tolerated standing and taking a few steps to get to chair with Min A for balance/safety. Spoke with RN regarding possible need for muscle relaxer and new pain medicine prior to d/c. Will try to follow up in PM to assess gait to determine if family able to manage pt at home. Will follow acutely.     Follow Up Recommendations Home health PT;Supervision for mobility/OOB    Equipment Recommendations  None recommended by PT    Recommendations for Other Services       Precautions / Restrictions Precautions Precautions: Back;Fall Precaution Booklet Issued: No Precaution Comments: Reviewed back precautions Restrictions Weight Bearing Restrictions: No      Mobility  Bed Mobility Overal bed mobility: Needs Assistance Bed Mobility: Rolling;Sidelying to Sit Rolling: Min guard Sidelying to sit: Min assist;HOB elevated       General bed mobility comments: Cues for log roll technique, increased time and heavy use of rail to get to EOB.   Transfers Overall transfer level: Needs assistance Equipment used: Rolling walker (2 wheeled) Transfers: Sit to/from Stand Sit to Stand: Min assist         General transfer comment: Assist to power to standing  with pt pulling up on RW due to right shoulder pain. Transferred to chair.  Ambulation/Gait Ambulation/Gait assistance: Min assist Ambulation Distance (Feet): 3 Feet Assistive device: Rolling walker (2 wheeled) Gait Pattern/deviations: Decreased stride length;Shuffle Gait velocity: Decreased   General Gait Details: Slow and guarded but able to take a few steps to get to chair with cues to maintain hands on RW handles. Cues for upright posture.  Stairs            Wheelchair Mobility    Modified Rankin (Stroke Patients Only)       Balance Overall balance assessment: Needs assistance Sitting-balance support: Bilateral upper extremity supported;Feet supported Sitting balance-Leahy Scale: Fair Sitting balance - Comments: Requires BUE support as position of comfort due to BLE spasms.   Standing balance support: During functional activity;Bilateral upper extremity supported Standing balance-Leahy Scale: Poor Standing balance comment: Requires BUE support in standing.                              Pertinent Vitals/Pain Pain Assessment: Faces Faces Pain Scale: Hurts even more Pain Location: BLEs Pain Descriptors / Indicators: Grimacing;Sharp;Spasm Pain Intervention(s): Monitored during session;Repositioned;Limited activity within patient's tolerance    Home Living Family/patient expects to be discharged to:: Private residence Living Arrangements: Children Available Help at Discharge: Family;Available 24 hours/day Type of Home: House Home Access: Stairs to enter Entrance Stairs-Rails: None Entrance Stairs-Number of Steps: 3 Home Layout: One level Home Equipment: Walker - 2 wheels;Bedside commode;Cane - single point;Shower seat;Adaptive equipment      Prior  Function Level of Independence: Needs assistance   Gait / Transfers Assistance Needed: Since arriving at home, pt has needed increased assist with mobility, standing etc.            Hand Dominance         Extremity/Trunk Assessment   Upper Extremity Assessment Upper Extremity Assessment: Defer to OT evaluation    Lower Extremity Assessment Lower Extremity Assessment: LLE deficits/detail;RLE deficits/detail RLE Deficits / Details: Spasms in lateral thighs RLE Sensation: WNL LLE Deficits / Details: Spasms in lateral thighs LLE Sensation: WNL    Cervical / Trunk Assessment Cervical / Trunk Assessment: Other exceptions Cervical / Trunk Exceptions: s/p lumbar surgery   Communication   Communication: No difficulties  Cognition Arousal/Alertness: Awake/alert Behavior During Therapy: WFL for tasks assessed/performed Overall Cognitive Status: Within Functional Limits for tasks assessed                                        General Comments General comments (skin integrity, edema, etc.): Granddaughter present during session.    Exercises     Assessment/Plan    PT Assessment Patient needs continued PT services  PT Problem List Decreased strength;Decreased activity tolerance;Decreased range of motion;Decreased balance;Decreased mobility;Decreased knowledge of use of DME;Decreased knowledge of precautions;Pain;Decreased skin integrity       PT Treatment Interventions DME instruction;Gait training;Stair training;Therapeutic activities;Therapeutic exercise;Functional mobility training;Balance training;Neuromuscular re-education;Patient/family education    PT Goals (Current goals can be found in the Care Plan section)  Acute Rehab PT Goals Patient Stated Goal: to decrease pain and return home PT Goal Formulation: With patient Time For Goal Achievement: 01/10/18 Potential to Achieve Goals: Good    Frequency Min 3X/week   Barriers to discharge Inaccessible home environment stairs to enter home    Co-evaluation               AM-PAC PT "6 Clicks" Daily Activity  Outcome Measure Difficulty turning over in bed (including adjusting bedclothes, sheets  and blankets)?: None Difficulty moving from lying on back to sitting on the side of the bed? : None Difficulty sitting down on and standing up from a chair with arms (e.g., wheelchair, bedside commode, etc,.)?: Unable Help needed moving to and from a bed to chair (including a wheelchair)?: A Little Help needed walking in hospital room?: A Little Help needed climbing 3-5 steps with a railing? : A Lot 6 Click Score: 17    End of Session Equipment Utilized During Treatment: Gait belt Activity Tolerance: Patient tolerated treatment well Patient left: in chair;with call bell/phone within reach;with chair alarm set;with family/visitor present Nurse Communication: Mobility status PT Visit Diagnosis: Pain;Difficulty in walking, not elsewhere classified (R26.2);Other abnormalities of gait and mobility (R26.89) Pain - part of body: (BLEs)    Time: 2831-5176 PT Time Calculation (min) (ACUTE ONLY): 33 min   Charges:   PT Evaluation $PT Eval Moderate Complexity: 1 Mod PT Treatments $Therapeutic Activity: 8-22 mins   PT G Codes:        Wray Kearns, PT, DPT [phone]    Marguarite Arbour A Lorain Keast 12/27/2017, 12:34 PM

## 2017-12-27 NOTE — Progress Notes (Signed)
Called back x2 for report , no answer, left message to call back, gave direct #

## 2017-12-27 NOTE — Progress Notes (Signed)
Attempt report x 1 was asked to hold call

## 2017-12-28 ENCOUNTER — Encounter (HOSPITAL_COMMUNITY): Payer: Self-pay

## 2017-12-28 DIAGNOSIS — I519 Heart disease, unspecified: Secondary | ICD-10-CM | POA: Diagnosis not present

## 2017-12-28 DIAGNOSIS — R52 Pain, unspecified: Secondary | ICD-10-CM | POA: Diagnosis not present

## 2017-12-28 DIAGNOSIS — N183 Chronic kidney disease, stage 3 (moderate): Secondary | ICD-10-CM | POA: Diagnosis not present

## 2017-12-28 DIAGNOSIS — I10 Essential (primary) hypertension: Secondary | ICD-10-CM | POA: Diagnosis not present

## 2017-12-28 DIAGNOSIS — G8918 Other acute postprocedural pain: Secondary | ICD-10-CM | POA: Diagnosis not present

## 2017-12-28 LAB — CBC
HCT: 24.8 % — ABNORMAL LOW (ref 36.0–46.0)
Hemoglobin: 8 g/dL — ABNORMAL LOW (ref 12.0–15.0)
MCH: 31 pg (ref 26.0–34.0)
MCHC: 32.3 g/dL (ref 30.0–36.0)
MCV: 96.1 fL (ref 78.0–100.0)
Platelets: 180 10*3/uL (ref 150–400)
RBC: 2.58 MIL/uL — ABNORMAL LOW (ref 3.87–5.11)
RDW: 12.8 % (ref 11.5–15.5)
WBC: 7.1 10*3/uL (ref 4.0–10.5)

## 2017-12-28 LAB — BASIC METABOLIC PANEL
Anion gap: 10 (ref 5–15)
BUN: 44 mg/dL — ABNORMAL HIGH (ref 6–20)
CO2: 27 mmol/L (ref 22–32)
Calcium: 8.9 mg/dL (ref 8.9–10.3)
Chloride: 98 mmol/L — ABNORMAL LOW (ref 101–111)
Creatinine, Ser: 1.38 mg/dL — ABNORMAL HIGH (ref 0.44–1.00)
GFR calc Af Amer: 41 mL/min — ABNORMAL LOW (ref >60)
GFR calc non Af Amer: 35 mL/min — ABNORMAL LOW (ref >60)
Glucose, Bld: 172 mg/dL — ABNORMAL HIGH (ref 65–99)
Potassium: 4 mmol/L (ref 3.5–5.1)
Sodium: 135 mmol/L (ref 135–145)

## 2017-12-28 MED ORDER — SENNOSIDES-DOCUSATE SODIUM 8.6-50 MG PO TABS
1.0000 | ORAL_TABLET | Freq: Two times a day (BID) | ORAL | Status: DC
  Administered 2017-12-28 – 2017-12-29 (×3): 1 via ORAL
  Filled 2017-12-28 (×3): qty 1

## 2017-12-28 MED ORDER — POLYETHYLENE GLYCOL 3350 17 G PO PACK: 17.0000 g | PACK | Freq: Every day | ORAL | Status: DC | PRN

## 2017-12-28 MED ORDER — DEXAMETHASONE 4 MG PO TABS
4.0000 mg | ORAL_TABLET | Freq: Three times a day (TID) | ORAL | Status: DC
  Administered 2017-12-28 – 2017-12-29 (×4): 4 mg via ORAL
  Filled 2017-12-28 (×4): qty 1

## 2017-12-28 NOTE — Progress Notes (Signed)
Patient ID: [name], female   DOB: May 30, 1936, 82 y.o.   MRN: [id number]  PROGRESS NOTE    [name]  YIF:[id number] DOB: [birth year] DOA: 12/26/2017 PCP: Biagio Borg, MD   Brief Narrative:  82 year old female with history of anemia, breast cancer, chronic kidney disease, diastolic dysfunction, GERD, hypertension, status post recent L1-L2 discectomy on 12/23/2017 presented on 12/27/2017 with intractable back pain and inability to move.  Neurosurgery was consulted.   Assessment & Plan:   Principal Problem:   Intractable pain Active Problems:   Morbid obesity ([place])   Essential hypertension   GASTROESOPHAGEAL REFLUX DISEASE   Chronic renal failure   Anemia   Breast cancer of upper-outer quadrant of left female breast (HCC)   Chronic venous insufficiency   Diastolic dysfunction   Arachnoiditis   Hyponatremia    Intractable back pain with left leg pain and weakness in a patient with recent L1-L2 discectomy -MRI reveals arachnoiditis with improvement in disc protrusion at L1 and L2. Neurosurgery following.  Patient has been started on Decadron as per neurosurgery recommendations.  Continue pain management.  -Back pain is improving.  Left lower extremity pain and weakness is slightly improved. -Continue PT/OT.  Patient might benefit from inpatient/outpatient rehab -Fall precautions.  Hypertension -Stable.  Continue amlodipine, Lasix, losartan and hydrochlorothiazide.  Monitor blood pressure  Chronic diastolic dysfunction -Echo 3 years ago reveals ejection fraction is 86% grade 1 diastolic dysfunction. Home medications include Lasix. She does not appear volume overloaded. -Continue home meds  Chronic kidney disease stage III -Creatinine stable.  Repeat a.m. creatinine.  Discontinue IV fluids  History of breast cancer -Continue tamoxifen.  Outpatient follow-up  Hyponatremia - likely related to recent surgery decreased oral intake.  -Improved.  Repeat a.m.  Labs  Leukocytosis -Probably reactive.  Resolved.  Repeat a.m. labs  Anemia probably from anemia of chronic disease from chronic kidney disease -Hemoglobin stable.  Monitor  DVT prophylaxis: heparin Code Status: full  Family Communication: none present  Disposition Plan: may benefit from rehab    Consultants: Neurosurgery  Procedures: None  Antimicrobials: None   Subjective: Patient seen and examined at bedside.  She feels slightly better, her back pain is better.  Her left leg still feels slightly weak and she states that it hurts like her leg is on fire.  No overnight fever or vomiting.  Objective: Vitals:   12/27/17 1946 12/27/17 2359 12/28/17 0452 12/28/17 0833  BP: (!) 129/56 115/62 127/61 (!) 150/66  Pulse: 88 78 66 87  Resp: 17 17 16 17   Temp: 98.4 F (36.9 C) 99.1 F (37.3 C) 97.8 F (36.6 C) 98.4 F (36.9 C)  TempSrc: Oral Oral Oral Oral  SpO2: 97% 96% 98% 97%  Weight:      Height:        Intake/Output Summary (Last 24 hours) at 12/28/2017 0914 Last data filed at 12/28/2017 7672 Gross per 24 hour  Intake 120 ml  Output 600 ml  Net -480 ml   Filed Weights   12/26/17 2305  Weight: 75.8 kg (167 lb)    Examination:  General exam: Appears calm and comfortable Respiratory system: Bilateral decreased breath sound at bases Cardiovascular system: S1 & S2 heard, rate controlled  gastrointestinal system: Abdomen is nondistended, soft and nontender. Normal bowel sounds heard. Central nervous system: Alert and oriented. No focal neurological deficits. Moving extremities Extremities: No cyanosis, clubbing, edema     Data Reviewed: I have personally reviewed following labs and imaging studies  CBC: Recent Labs  Lab 12/23/17 0857 12/26/17 2317 12/28/17 0407  WBC 10.7* 12.7* 7.1  NEUTROABS  --  10.0*  --   HGB 10.7* 9.1* 8.0*  HCT 33.1* 27.4* 24.8*  MCV 97.1 96.1 96.1  PLT 195 196 673   Basic Metabolic Panel: Recent Labs  Lab 12/23/17 0857  12/26/17 2317 12/28/17 0407  NA 132* 129* 135  K 3.2* 3.5 4.0  CL 92* 91* 98*  CO2 24 25 27   GLUCOSE 100* 137* 172*  BUN 55* 58* 44*  CREATININE 1.93* 1.70* 1.38*  CALCIUM 9.1 8.8* 8.9   GFR: Estimated Creatinine Clearance: 32.2 mL/min (A) (by C-G formula based on SCr of 1.38 mg/dL (H)). Liver Function Tests: No results for input(s): AST, ALT, ALKPHOS, BILITOT, PROT, ALBUMIN in the last 168 hours. No results for input(s): LIPASE, AMYLASE in the last 168 hours. No results for input(s): AMMONIA in the last 168 hours. Coagulation Profile: No results for input(s): INR, PROTIME in the last 168 hours. Cardiac Enzymes: Recent Labs  Lab 12/26/17 2317  CKTOTAL 171   BNP (last 3 results) No results for input(s): PROBNP in the last 8760 hours. HbA1C: No results for input(s): HGBA1C in the last 72 hours. CBG: No results for input(s): GLUCAP in the last 168 hours. Lipid Profile: No results for input(s): CHOL, HDL, LDLCALC, TRIG, CHOLHDL, LDLDIRECT in the last 72 hours. Thyroid Function Tests: No results for input(s): TSH, T4TOTAL, FREET4, T3FREE, THYROIDAB in the last 72 hours. Anemia Panel: No results for input(s): VITAMINB12, FOLATE, FERRITIN, TIBC, IRON, RETICCTPCT in the last 72 hours. Sepsis Labs: No results for input(s): PROCALCITON, LATICACIDVEN in the last 168 hours.  Recent Results (from the past 240 hour(s))  Surgical pcr screen     Status: None   Collection Time: 12/23/17  8:57 AM  Result Value Ref Range Status   MRSA, PCR NEGATIVE NEGATIVE Final   Staphylococcus aureus NEGATIVE NEGATIVE Final    Comment: (NOTE) The Xpert SA Assay (FDA approved for NASAL specimens in patients 24 years of age and older), is one component of a comprehensive surveillance program. It is not intended to diagnose infection nor to guide or monitor treatment. Performed at [facility], [place] [address] [place], [place] [address]          Radiology Studies: Mr Lumbar Spine W Wo  Contrast  Result Date: 12/27/2017 CLINICAL DATA:  Right thigh pain.  Recent lumbar spine surgery. EXAM: MRI LUMBAR SPINE WITHOUT AND WITH CONTRAST TECHNIQUE: Multiplanar and multiecho pulse sequences of the lumbar spine were obtained without and with intravenous contrast. CONTRAST:  55mL MULTIHANCE GADOBENATE DIMEGLUMINE 529 MG/ML IV SOLN COMPARISON:  Lumbar spine MRI 11/29/2017 FINDINGS: Segmentation:  Standard. Alignment: Levoscoliosis. Vertebrae: No fracture, evidence of discitis, or bone lesion. Status post L1-2 left laminectomy. Conus medullaris and cauda equina: Conus extends to the L1 level. The cauda equina nerve roots are clustered, with minimal peripheral contrast enhancement. Paraspinal and other soft tissues: Post surgical changes in the posterior paraspinous soft tissues. Distended urinary bladder. Disc levels: T10-T11 and T11-T12 are evaluated in the sagittal plane only. There is no large disc herniation or spinal canal stenosis. T12-L1: No stenosis L1-L2: Status post left laminectomy and microdiscectomy. Greatly decreased size of disc herniation, with small amount of residual central disc material. Mild spinal canal narrowing. L2-L3: Disc bulge and facet arthrosis with unchanged moderate spinal canal stenosis. Severe right foraminal stenosis. L3-L4: Unchanged moderate to severe spinal canal stenosis and severe right foraminal stenosis.  L4-L5: Severe left neural foraminal stenosis. L5-S1: Severe right neural foraminal stenosis. Visualized sacrum: Normal. IMPRESSION: 1. Clumping of the cauda equina nerve roots is suggestive of arachnoiditis. Minimal associated contrast enhancement. No epidural abscess or hematoma. 2. Marked reduction of disc herniation at the L1-2 level following microdiscectomy. 3. Otherwise unchanged multilevel severe degenerative disc disease with severe spinal canal and neural foraminal stenosis at multiple levels. Electronically Signed   By: Ulyses Jarred M.D.   On: 12/27/2017  05:28   Dg Chest Portable 1 View  Result Date: 12/27/2017 CLINICAL DATA:  Thigh pain short of breath EXAM: PORTABLE CHEST 1 VIEW COMPARISON:  12/10/2017, 09/22/2016 FINDINGS: Borderline to mild cardiomegaly. Patchy atelectasis at the left lung base. No pleural effusion. Aortic atherosclerosis. No pneumothorax. IMPRESSION: Borderline to mild cardiomegaly with patchy atelectasis at the left lung base. Electronically Signed   By: Donavan Foil M.D.   On: 12/27/2017 00:59        Scheduled Meds: . amLODipine  5 mg Oral Daily  . aspirin EC  81 mg Oral Daily  . darifenacin  7.5 mg Oral Daily  . furosemide  20 mg Oral Daily  . gabapentin  100 mg Oral TID  . heparin  5,000 Units Subcutaneous Q8H  . hydrocerin   Topical TID  . losartan  100 mg Oral Daily   And  . hydrochlorothiazide  25 mg Oral Daily  . mometasone-formoterol  2 puff Inhalation BID  . pantoprazole  40 mg Oral Daily  . tamoxifen  20 mg Oral Daily  . triamcinolone  2 spray Nasal Daily   Continuous Infusions: . sodium chloride 75 mL/hr at 12/27/17 1254     LOS: 0 days        Aline August, MD [facility] Pager [phone]  If 7PM-7AM, please contact night-coverage [email or web address] Password [facility] 12/28/2017, 9:14 AM

## 2017-12-28 NOTE — Progress Notes (Signed)
Physical Therapy Treatment Patient Details Name: [name] MRN: [id number] DOB: 04/27/1936 Today's Date: 12/28/2017    History of Present Illness Patient is a 82 y/o female who is s/p L1-2 lami and discectomy 2/28 presents with Bil thigh pain and inability to walk. MRI lumbar spine- arachnoiditis. PMH includes breast ca s/p lumpectomy, HTN, vertigo, asthma.    PT Comments    Patient progressing well towards PT goals. Improved ambulation distance today with Min guard assist for safety. Continues to have BLE spasms. Needs cues to adhere to back precautions during functional mobility. Pt now does not believe that she can manage at home as her daughter has to go back to work and will not be able to assist with care/mobility. Discharge recommendation updated to SNF. Will continue to follow and progress.    Follow Up Recommendations  SNF;Supervision for mobility/OOB     Equipment Recommendations  None recommended by PT    Recommendations for Other Services       Precautions / Restrictions Precautions Precautions: Back;Fall Precaution Booklet Issued: No Precaution Comments: Reviewed precautions Restrictions Weight Bearing Restrictions: No    Mobility  Bed Mobility               General bed mobility comments: Up in chair upon PT arrival.   Transfers Overall transfer level: Needs assistance Equipment used: Rolling walker (2 wheeled) Transfers: Sit to/from Stand Sit to Stand: Min guard         General transfer comment: Min guard for safety. Stood from [employer], from toilet x1. Cues for hand placement/technique.  Ambulation/Gait Ambulation/Gait assistance: Min guard Ambulation Distance (Feet): 75 Feet Assistive device: Rolling walker (2 wheeled) Gait Pattern/deviations: Decreased stride length;Shuffle;Step-through pattern;Trunk flexed Gait velocity: Decreased Gait velocity interpretation: Below normal speed for age/gender General Gait Details: Slow, guarded  gait; mildly unsteady gait using RW. Spasms in BLEs during mobility. Cues for upright posture.   Stairs            Wheelchair Mobility    Modified Rankin (Stroke Patients Only)       Balance Overall balance assessment: Needs assistance Sitting-balance support: No upper extremity supported;Feet supported Sitting balance-Leahy Scale: Good     Standing balance support: During functional activity Standing balance-Leahy Scale: Poor Standing balance comment: Able to stand at sink and wash hands with close Min guard but requires UE support for ambulation.                            Cognition Arousal/Alertness: Awake/alert Behavior During Therapy: WFL for tasks assessed/performed Overall Cognitive Status: Within Functional Limits for tasks assessed                                        Exercises      General Comments        Pertinent Vitals/Pain Pain Assessment: Faces Faces Pain Scale: Hurts little more Pain Location: BLEs Pain Descriptors / Indicators: Grimacing;Sharp;Spasm Pain Intervention(s): Monitored during session;Repositioned    Home Living                      Prior Function            PT Goals (current goals can now be found in the care plan section) Progress towards PT goals: Progressing toward goals    Frequency    Min  3X/week      PT Plan Discharge plan needs to be updated    Co-evaluation              AM-PAC PT "6 Clicks" Daily Activity  Outcome Measure  Difficulty turning over in bed (including adjusting bedclothes, sheets and blankets)?: None Difficulty moving from lying on back to sitting on the side of the bed? : Unable Difficulty sitting down on and standing up from a chair with arms (e.g., wheelchair, bedside commode, etc,.)?: None Help needed moving to and from a bed to chair (including a wheelchair)?: A Little Help needed walking in hospital room?: A Little Help needed climbing 3-5  steps with a railing? : A Little 6 Click Score: 18    End of Session Equipment Utilized During Treatment: Gait belt Activity Tolerance: Patient tolerated treatment well Patient left: in chair;with call bell/phone within reach;with chair alarm set Nurse Communication: Mobility status PT Visit Diagnosis: Pain;Difficulty in walking, not elsewhere classified (R26.2);Other abnormalities of gait and mobility (R26.89) Pain - Right/Left: (bilateral) Pain - part of body: (LEs)     Time: 7530-0511 PT Time Calculation (min) (ACUTE ONLY): 34 min  Charges:  $Gait Training: 8-22 mins $Therapeutic Activity: 8-22 mins                    G Codes:       [name], PT, DPT [phone]     [name] Worst 12/28/2017, 11:29 AM

## 2017-12-28 NOTE — NC FL2 (Signed)
[place] MEDICAID FL2 LEVEL OF CARE SCREENING TOOL     IDENTIFICATION  Patient Name: [name] Birthdate: 29-Nov-1935 Sex: female Admission Date (Current Location): 12/26/2017  [facility] [address] and [place] Number:  Herbalist and Address:  [facility]. [facility], [place] [address], [place], [place] [address]      Provider Number: [id number]  Attending Physician Name and Address:  Aline August, MD  Relative Name and Phone Number:       Current Level of Care: Hospital Recommended Level of Care: [place] Prior Approval Number:    Date Approved/Denied:   PASRR Number: [id number]  Discharge Plan: SNF    Current Diagnoses: Patient Active Problem List   Diagnosis Date Noted  . Intractable pain 12/27/2017  . Arachnoiditis 12/27/2017  . Hyponatremia   . S/P lumbar laminectomy 12/23/2017  . Wheezing 12/10/2017  . Bilateral knee pain 12/10/2017  . Itching 11/22/2017  . Urinary frequency 11/22/2017  . Degenerative disc disease, lumbar 11/05/2017  . Left sided sciatica 09/04/2017  . Asthma exacerbation 08/31/2017  . Rheumatoid arthritis flare ([place]) 05/11/2017  . Acute gouty arthritis 05/01/2017  . Vertigo 10/30/2016  . Allergic rhinitis 10/30/2016  . Bowel incontinence 07/31/2016  . Hemorrhoids 07/31/2016  . Hematochezia 07/31/2016  . Chronic venous insufficiency 07/10/2016  . Diastolic dysfunction 02/77/4128  . Peripheral edema 07/10/2016  . Left ankle pain 06/02/2016  . Neck pain 03/10/2016  . Low back pain 03/10/2016  . Right cervical radiculopathy 11/13/2015  . Bradycardia 03/15/2015  . Arthralgia 12/18/2014  . Fever 11/27/2014  . Rheumatoid arthritis ([place]) 10/23/2014  . Impaired glucose tolerance 07/18/2014  . Right lumbar radiculopathy 07/18/2014  . Preventative health care 02/20/2014  . Breast cancer of upper-outer quadrant of left female breast ([place]) 11/08/2013  . Diverticulosis 07/09/2012  . Headache(784.0)  06/30/2012  . Anemia 06/29/2012  . Dyspnea 03/11/2012  . Cough 03/11/2012  . Chronic renal failure 05/18/2011  . GASTROESOPHAGEAL REFLUX DISEASE 07/05/2008  . VITAMIN D DEFICIENCY 12/19/2007  . Morbid obesity ([place]) 12/16/2007  . Essential hypertension 12/16/2007  . DEGENERATIVE JOINT DISEASE 12/16/2007    Orientation RESPIRATION BLADDER Height & Weight     Self, Time, Situation, Place  Normal Continent Weight: 167 lb (75.8 kg) Height:  5' 3.75" (161.9 cm)  BEHAVIORAL SYMPTOMS/MOOD NEUROLOGICAL BOWEL NUTRITION STATUS      Continent Diet(see DC summary)  AMBULATORY STATUS COMMUNICATION OF NEEDS Skin   Extensive Assist Verbally Surgical wounds(closed back incision, adhesive strips dressing)                       Personal Care Assistance Level of Assistance  Bathing, Feeding, Dressing Bathing Assistance: Limited assistance Feeding assistance: Independent Dressing Assistance: Limited assistance     Functional Limitations Info  Sight, Hearing, Speech Sight Info: Adequate Hearing Info: Adequate Speech Info: Adequate    SPECIAL CARE FACTORS FREQUENCY  PT (By licensed PT), OT (By licensed OT)     PT Frequency: 5x/wk OT Frequency: 5x/wk            Contractures Contractures Info: Not present    Additional Factors Info  Code Status, Allergies Code Status Info: Full Allergies Info: Hydrocodone, Iron           Current Medications (12/28/2017):  This is the current hospital active medication list Current Facility-Administered Medications  Medication Dose Route Frequency Provider Last Rate Last Dose  . acetaminophen (TYLENOL) tablet 650 mg  650 mg Oral Q6H PRN  Black, Lezlie Octave, NP       Or  . acetaminophen (TYLENOL) suppository 650 mg  650 mg Rectal Q6H PRN Radene Gunning, NP      . acetaminophen (TYLENOL) tablet 650 mg  650 mg Oral Q6H PRN Opyd, Ilene Qua, MD      . albuterol (PROVENTIL) (2.5 MG/3ML) 0.083% nebulizer solution 2.5 mg  2.5 mg Inhalation Q6H PRN Black,  Karen M, NP      . amLODipine (NORVASC) tablet 5 mg  5 mg Oral Daily Radene Gunning, NP   5 mg at 12/28/17 4650  . aspirin EC tablet 81 mg  81 mg Oral Daily Radene Gunning, NP   81 mg at 12/28/17 3546  . darifenacin (ENABLEX) 24 hr tablet 7.5 mg  7.5 mg Oral Daily Radene Gunning, NP   7.5 mg at 12/28/17 5681  . dexamethasone (DECADRON) tablet 4 mg  4 mg Oral Q8H Alekh, Kshitiz, MD   4 mg at 12/28/17 1101  . diphenhydrAMINE (BENADRYL) capsule 25 mg  25 mg Oral Q6H PRN Radene Gunning, NP   25 mg at 12/27/17 2148  . furosemide (LASIX) tablet 20 mg  20 mg Oral Daily Radene Gunning, NP   20 mg at 12/28/17 2751  . gabapentin (NEURONTIN) capsule 100 mg  100 mg Oral TID Radene Gunning, NP   100 mg at 12/28/17 7001  . heparin injection 5,000 Units  5,000 Units Subcutaneous Q8H Radene Gunning, NP   5,000 Units at 12/28/17 0542  . hydrocerin (EUCERIN) cream   Topical TID Radene Gunning, NP      . losartan (COZAAR) tablet 100 mg  100 mg Oral Daily Karmen Bongo, MD   100 mg at 12/28/17 7494   And  . hydrochlorothiazide (HYDRODIURIL) tablet 25 mg  25 mg Oral Daily Karmen Bongo, MD   25 mg at 12/28/17 0902  . HYDROcodone-acetaminophen (NORCO/VICODIN) 5-325 MG per tablet 1 tablet  1 tablet Oral Q4H PRN Radene Gunning, NP      . HYDROmorphone (DILAUDID) injection 0.5 mg  0.5 mg Intravenous Q4H PRN Karmen Bongo, MD   0.5 mg at 12/27/17 1805  . meclizine (ANTIVERT) tablet 12.5 mg  12.5 mg Oral BID PRN Radene Gunning, NP      . mometasone-formoterol (DULERA) 200-5 MCG/ACT inhaler 2 puff  2 puff Inhalation BID Radene Gunning, NP   2 puff at 12/28/17 0900  . ondansetron (ZOFRAN) tablet 4 mg  4 mg Oral Q6H PRN Radene Gunning, NP       Or  . ondansetron [facility]) injection 4 mg  4 mg Intravenous Q6H PRN Black, Karen M, NP      . pantoprazole (PROTONIX) EC tablet 40 mg  40 mg Oral Daily Radene Gunning, NP   40 mg at 12/28/17 4967  . tamoxifen (NOLVADEX) tablet 20 mg  20 mg Oral Daily Radene Gunning, NP   20 mg  at 12/28/17 5916  . traMADol (ULTRAM) tablet 50 mg  50 mg Oral Q6H PRN Radene Gunning, NP   50 mg at 12/28/17 0542  . triamcinolone (NASACORT) nasal inhaler 2 spray  2 spray Nasal Daily Radene Gunning, NP   2 spray at 12/28/17 0902     Discharge Medications: Please see discharge summary for a list of discharge medications.  Relevant Imaging Results:  Relevant Lab Results:   Additional Information SS#: [id number]  Geralynn Ochs, LCSW

## 2017-12-28 NOTE — Care Management Obs Status (Signed)
[place] NOTIFICATION   Patient Details  Name: [name] MRN: [id number] Date of Birth: Oct 12, 1936   Medicare Observation Status Notification Given:  Yes    Pollie Friar, RN 12/28/2017, 2:52 PM

## 2017-12-28 NOTE — Care Management Note (Signed)
Case Management Note  Patient Details  Name: [name] MRN: [id number] Date of Birth: 1936-05-28  Subjective/Objective:      Pt in with intractable pain. She was recently d/ced with [facility] for [facility] services. She lives home alone.              Action/Plan: PT recommending SNF. CSW aware. CM following for d/c disposition.   Expected Discharge Date:                  Expected Discharge Plan:  Skilled Nursing Facility  In-House Referral:  Clinical Social Work  Discharge planning Services     Post Acute Care Choice:    Choice offered to:     DME Arranged:    DME Agency:     HH Arranged:    [place] Agency:     Status of Service:  In process, will continue to follow  If discussed at Long Length of Stay Meetings, dates discussed:    Additional Comments:  Pollie Friar, RN 12/28/2017, 11:27 AM

## 2017-12-29 DIAGNOSIS — E871 Hypo-osmolality and hyponatremia: Secondary | ICD-10-CM

## 2017-12-29 DIAGNOSIS — I872 Venous insufficiency (chronic) (peripheral): Secondary | ICD-10-CM | POA: Diagnosis not present

## 2017-12-29 DIAGNOSIS — R269 Unspecified abnormalities of gait and mobility: Secondary | ICD-10-CM

## 2017-12-29 DIAGNOSIS — I10 Essential (primary) hypertension: Secondary | ICD-10-CM | POA: Diagnosis not present

## 2017-12-29 DIAGNOSIS — G8918 Other acute postprocedural pain: Secondary | ICD-10-CM | POA: Diagnosis not present

## 2017-12-29 DIAGNOSIS — R52 Pain, unspecified: Secondary | ICD-10-CM | POA: Diagnosis not present

## 2017-12-29 DIAGNOSIS — C50412 Malignant neoplasm of upper-outer quadrant of left female breast: Secondary | ICD-10-CM

## 2017-12-29 DIAGNOSIS — Z170 Estrogen receptor positive status [ER+]: Secondary | ICD-10-CM

## 2017-12-29 DIAGNOSIS — N183 Chronic kidney disease, stage 3 (moderate): Secondary | ICD-10-CM

## 2017-12-29 DIAGNOSIS — G039 Meningitis, unspecified: Secondary | ICD-10-CM

## 2017-12-29 DIAGNOSIS — K219 Gastro-esophageal reflux disease without esophagitis: Secondary | ICD-10-CM | POA: Diagnosis not present

## 2017-12-29 DIAGNOSIS — R262 Difficulty in walking, not elsewhere classified: Secondary | ICD-10-CM | POA: Diagnosis not present

## 2017-12-29 LAB — BASIC METABOLIC PANEL
Anion gap: 11 (ref 5–15)
BUN: 39 mg/dL — ABNORMAL HIGH (ref 6–20)
CO2: 27 mmol/L (ref 22–32)
Calcium: 9 mg/dL (ref 8.9–10.3)
Chloride: 99 mmol/L — ABNORMAL LOW (ref 101–111)
Creatinine, Ser: 1.22 mg/dL — ABNORMAL HIGH (ref 0.44–1.00)
GFR calc Af Amer: 47 mL/min — ABNORMAL LOW (ref >60)
GFR calc non Af Amer: 41 mL/min — ABNORMAL LOW (ref >60)
Glucose, Bld: 138 mg/dL — ABNORMAL HIGH (ref 65–99)
Potassium: 3.6 mmol/L (ref 3.5–5.1)
Sodium: 137 mmol/L (ref 135–145)

## 2017-12-29 LAB — CBC WITH DIFFERENTIAL/PLATELET
Basophils Absolute: 0 10*3/uL (ref 0.0–0.1)
Basophils Relative: 0 %
Eosinophils Absolute: 0 10*3/uL (ref 0.0–0.7)
Eosinophils Relative: 0 %
HCT: 23.8 % — ABNORMAL LOW (ref 36.0–46.0)
Hemoglobin: 7.9 g/dL — ABNORMAL LOW (ref 12.0–15.0)
Lymphocytes Relative: 7 %
Lymphs Abs: 0.9 10*3/uL (ref 0.7–4.0)
MCH: 32 pg (ref 26.0–34.0)
MCHC: 33.2 g/dL (ref 30.0–36.0)
MCV: 96.4 fL (ref 78.0–100.0)
Monocytes Absolute: 0.8 10*3/uL (ref 0.1–1.0)
Monocytes Relative: 6 %
Neutro Abs: 11.1 10*3/uL — ABNORMAL HIGH (ref 1.7–7.7)
Neutrophils Relative %: 87 %
Platelets: 222 10*3/uL (ref 150–400)
RBC: 2.47 MIL/uL — ABNORMAL LOW (ref 3.87–5.11)
RDW: 12.8 % (ref 11.5–15.5)
WBC: 12.8 10*3/uL — ABNORMAL HIGH (ref 4.0–10.5)

## 2017-12-29 LAB — MAGNESIUM: Magnesium: 1.3 mg/dL — ABNORMAL LOW (ref 1.7–2.4)

## 2017-12-29 MED ORDER — DEXAMETHASONE 4 MG PO TABS: 4.0000 mg | ORAL_TABLET | Freq: Three times a day (TID) | ORAL | 0 refills | Status: DC

## 2017-12-29 NOTE — Progress Notes (Signed)
Occupational Therapy Evaluation Note (late entry)  Pt admitted with the below deficits.  She has poor carry over of back precautions.  She currently, requires mod A for LB ADLs, and min guard assist for functional mobility and min A for bed mobility.  She lives with daughter, who works during the day, and is unable to provide the necessary level of assist pt requires.  Recommend SNF level rehab to allow her to maximize safety and independence with ADLs  - pt is at  risk for fall and readmission  Recommend SNF   No DME recommended    12/28/17 2000  OT Visit Information  Last OT Received On 12/28/17  Assistance Needed +1  History of Present Illness Patient is a 82 y/o female who is s/p L1-2 lami and discectomy 2/28 presents with Bil thigh pain and inability to walk. MRI lumbar spine- arachnoiditis. PMH includes breast ca s/p lumpectomy, HTN, vertigo, asthma.  Precautions  Precautions Back;Fall  Precaution Booklet Issued No  Precaution Comments Pt unable to recall back precautions.  Reviewed them with her - she requires mod cues to adhere to them during session   Restrictions  Weight Bearing Restrictions No  Home Living  Family/patient expects to be discharged to: Private residence  Living Arrangements Children  Available Help at Discharge Family;Available 24 hours/day  Type of Home House  Home Access Stairs to enter  Entrance Stairs-Number of Steps 3  Entrance Stairs-Rails None  Home Layout One level  Bathroom Shower/Tub Tub/shower unit  Bathroom Toilet Handicapped height  Home Equipment Walker - 2 wheels;BSC;Cane - single point;Shower seat;Adaptive equipment  Adaptive Equipment Reacher;Long-handled sponge  Prior Function  Level of Independence Needs assistance  Gait / Transfers Assistance Needed Since arriving at home, pt has needed increased assist with mobility, standing etc.   ADL's / Homemaking Assistance Needed Pt reports daughter was assisting her with all aspects of ADLs   Communication  Communication No difficulties  Pain Assessment  Faces Pain Scale 4  Pain Location BLEs  Pain Descriptors / Indicators Grimacing  Cognition  Arousal/Alertness Awake/alert  Behavior During Therapy WFL for tasks assessed/performed  Overall Cognitive Status Within Functional Limits for tasks assessed  General Comments Pt is very talkative and difficult to direct to task   ADL  Overall ADL's  Needs assistance/impaired  Eating/Feeding Modified independent;Sitting  Grooming Wash/dry hands;Standing;Min guard  Upper Body Bathing Set up;Supervision/ safety;Sitting  Lower Body Bathing Moderate assistance;Sit to/from stand  Upper Body Dressing  Set up;Sitting  Lower Body Dressing Moderate assistance;Sit to/from Environmental education officer guard;Ambulation;Comfort height toilet;Grab bars  Toileting- Clothing Manipulation and Hygiene Moderate assistance;Sit to/from stand  Functional mobility during ADLs Min guard;Rolling walker  General ADL Comments Pt unable to access feet   Bed Mobility  Overal bed mobility Needs Assistance  Bed Mobility Rolling;Sidelying to Sit  Rolling Min guard  Sidelying to sit Min assist;HOB elevated  General bed mobility comments verbal cues for technique, and min A to lift trunk   Transfers  Overall transfer level Needs assistance  Equipment used Rolling walker (2 wheeled)  Transfers Sit to/from [employer] Transfers  Sit to Stand Min guard  Stand pivot transfers Min guard  General transfer comment min guard for safety   Balance  Overall balance assessment Needs assistance  Sitting-balance support No upper extremity supported;Feet supported  Sitting balance-Leahy Scale Good  Standing balance support During functional activity  Standing balance-Leahy Scale Poor  Standing balance comment requires UE support   OT -  End of Session  Equipment Utilized During Treatment Rolling walker  Activity Tolerance Patient tolerated treatment well   Patient left with call bell/phone within reach;in chair;with chair alarm set  Nurse Communication Mobility status  OT Assessment  OT Recommendation/Assessment Patient needs continued OT Services  OT Visit Diagnosis Pain  Pain - Right/Left Right  Pain - part of body Leg  OT Problem List Decreased activity tolerance;Impaired balance (sitting and/or standing);Decreased safety awareness;Decreased knowledge of use of DME or AE;Pain  Barriers to Discharge Decreased caregiver support  OT Plan  OT Frequency (ACUTE ONLY) Min 2X/week  OT Treatment/Interventions (ACUTE ONLY) Self-care/ADL training;DME and/or AE instruction;Therapeutic activities;Patient/family education;Balance training  AM-PAC OT "6 Clicks" Daily Activity Outcome Measure  Help from another person eating meals? 4  Help from another person taking care of personal grooming? 3  Help from another person toileting, which includes using toliet, bedpan, or urinal? 2  Help from another person bathing (including washing, rinsing, drying)? 2  Help from another person to put on and taking off regular upper body clothing? 3  Help from another person to put on and taking off regular lower body clothing? 2  6 Click Score 16  ADL G Code Conversion CK  OT Recommendation  Follow Up Recommendations SNF;Supervision/Assistance - 24 hour  OT Equipment None recommended by OT  Individuals Consulted  Consulted and Agree with Results and Recommendations Patient  Acute Rehab OT Goals  Patient Stated Goal to get better and regain independence   OT Goal Formulation With patient  Time For Goal Achievement 01/12/18  Potential to Achieve Goals Good  OT Time Calculation  OT Start Time (ACUTE ONLY) 1713  OT Stop Time (ACUTE ONLY) 1746  OT Time Calculation (min) 33 min  OT General Charges  $OT Visit 1 Visit  OT Evaluation  $OT Eval Moderate Complexity 1 Mod  OT Treatments  $Therapeutic Activity 8-22 mins  Written Expression  Dominant Hand Right   Lucille Passy, OTR/L [phone]

## 2017-12-29 NOTE — Progress Notes (Signed)
CSW following for discharge plan. CSW notified that patient ready for discharge, but has not received [employer] authorization for SNF at this time. CSW contacted CSW AD to discuss possibility of LOG for patient to admit to SNF without authorization; CSW AD denied request for LOG as patient would likely not receive approval from [facility] for SNF placement as she is mobilizing much better.  CSW informed patient and patient's daughter that the patient will not be able to admit to SNF as [facility] will not agree to pay. Patient discussed that she feels better and was able to get herself up and go to the bathroom, she feels comfortable going home. CSW called and updated patient's daughter; patient's daughter was not happy, but understood. Patient's daughter will be coming to the hospital to get her after an appointment that she has at 5:30 pm.  CSW signing off.  [name], [place] Clinical Social Worker [phone]

## 2017-12-29 NOTE — Discharge Instructions (Signed)
Fall Prevention in the Home Falls can cause injuries and can affect people from all age groups. There are many simple things that you can do to make your home safe and to help prevent falls. What can I do on the outside of my home?  Regularly repair the edges of walkways and driveways and fix any cracks.  Remove high doorway thresholds.  Trim any shrubbery on the main path into your home.  Use bright outdoor lighting.  Clear walkways of debris and clutter, including tools and rocks.  Regularly check that handrails are securely fastened and in good repair. Both sides of any steps should have handrails.  Install guardrails along the edges of any raised decks or porches.  Have leaves, snow, and ice cleared regularly.  Use sand or salt on walkways during winter months.  In the garage, clean up any spills right away, including grease or oil spills. What can I do in the bathroom?  Use night lights.  Install grab bars by the toilet and in the tub and shower. Do not use towel bars as grab bars.  Use non-skid mats or decals on the floor of the tub or shower.  If you need to sit down while you are in the shower, use a plastic, non-slip stool.  Keep the floor dry. Immediately clean up any water that spills on the floor.  Remove soap buildup in the tub or shower on a regular basis.  Attach bath mats securely with double-sided non-slip rug tape.  Remove throw rugs and other tripping hazards from the floor. What can I do in the bedroom?  Use night lights.  Make sure that a bedside light is easy to reach.  Do not use oversized bedding that drapes onto the floor.  Have a firm chair that has side arms to use for getting dressed.  Remove throw rugs and other tripping hazards from the floor. What can I do in the kitchen?  Clean up any spills right away.  Avoid walking on wet floors.  Place frequently used items in easy-to-reach places.  If you need to reach for something above  you, use a sturdy step stool that has a grab bar.  Keep electrical cables out of the way.  Do not use floor polish or wax that makes floors slippery. If you have to use wax, make sure that it is non-skid floor wax.  Remove throw rugs and other tripping hazards from the floor. What can I do in the stairways?  Do not leave any items on the stairs.  Make sure that there are handrails on both sides of the stairs. Fix handrails that are broken or loose. Make sure that handrails are as long as the stairways.  Check any carpeting to make sure that it is firmly attached to the stairs. Fix any carpet that is loose or worn.  Avoid having throw rugs at the top or bottom of stairways, or secure the rugs with carpet tape to prevent them from moving.  Make sure that you have a light switch at the top of the stairs and the bottom of the stairs. If you do not have them, have them installed. What are some other fall prevention tips?  Wear closed-toe shoes that fit well and support your feet. Wear shoes that have rubber soles or low heels.  When you use a stepladder, make sure that it is completely opened and that the sides are firmly locked. Have someone hold the ladder while you are using   it. Do not climb a closed stepladder.  Add color or contrast paint or tape to grab bars and handrails in your home. Place contrasting color strips on the first and last steps.  Use mobility aids as needed, such as canes, walkers, scooters, and crutches.  Turn on lights if it is dark. Replace any light bulbs that burn out.  Set up furniture so that there are clear paths. Keep the furniture in the same spot.  Fix any uneven floor surfaces.  Choose a carpet design that does not hide the edge of steps of a stairway.  Be aware of any and all pets.  Review your medicines with your healthcare provider. Some medicines can cause dizziness or changes in blood pressure, which increase your risk of falling. Talk with  your health care provider about other ways that you can decrease your risk of falls. This may include working with a physical therapist or trainer to improve your strength, balance, and endurance. This information is not intended to replace advice given to you by your health care provider. Make sure you discuss any questions you have with your health care provider. Document Released: 10/02/2002 Document Revised: 03/10/2016 Document Reviewed: 11/16/2014 Elsevier Interactive Patient Education  2018 [employer]  

## 2017-12-29 NOTE — Discharge Summary (Signed)
Discharge Summary  [name] GBT:[id number] DOB: 1936/08/27  PCP: Biagio Borg, MD  Admit date: 12/26/2017 Discharge date: 12/29/2017  Time spent: 25 minutes   Recommendations for Outpatient Follow-up:  1. Follow with neurosurgery posthospitalization 2. Continue PT OT at home 3. Fall precautions 4. Take your medications as prescribed  Discharge Diagnoses:  Active Hospital Problems   Diagnosis Date Noted  . Intractable pain 12/27/2017  . Arachnoiditis 12/27/2017  . Hyponatremia   . Chronic venous insufficiency 07/10/2016  . Diastolic dysfunction 71/03/2693  . Breast cancer of upper-outer quadrant of left female breast ([place]) 11/08/2013  . Anemia 06/29/2012  . Chronic renal failure 05/18/2011  . GASTROESOPHAGEAL REFLUX DISEASE 07/05/2008  . Essential hypertension 12/16/2007  . Morbid obesity ([place]) 12/16/2007    Resolved Hospital Problems  No resolved problems to display.    Discharge Condition: Stable  Diet recommendation: Resume previous diet  Vitals:   12/29/17 0757 12/29/17 1210  BP: 134/62 (!) 148/70  Pulse: 80 90  Resp: 16 17  Temp: 98.6 F (37 C) 98.8 F (37.1 C)  SpO2: 95% 96%    History of present illness:  82 year old female with history of anemia, breast cancer, chronic kidney disease, diastolic dysfunction, GERD, hypertension, status post recent L1-L2 decompression and discectomy on 12/23/2017 presented on 12/27/2017 with intractable back pain and inability to move. Neurosurgery was consulted.  Was started on Decadron which she tolerated well.  Work with physical therapy with recommendation for skilled nursing facility.  Social worker contacted.  Difficulty with placement.  Patient reports she has good support at home and always has family members present.  On the day of discharge patient was hemodynamically stable. She will need to follow-up with her primary care provider and neurosurgery post hospitalization.  All questions answered to her  satisfaction.  Hospital Course:  Principal Problem:   Intractable pain Active Problems:   Morbid obesity ([place])   Essential hypertension   GASTROESOPHAGEAL REFLUX DISEASE   Chronic renal failure   Anemia   Breast cancer of upper-outer quadrant of left female breast ([facility])   Chronic venous insufficiency   Diastolic dysfunction   Arachnoiditis   Hyponatremia  Intractable back pain with left leg pain and weakness in a patient with recent L1-L2 discectomy -MRI reveals arachnoiditis with improvement in disc protrusion at L1 and L2. Neurosurgery following.  Patient has been started on Decadron as per neurosurgery recommendations.  Continue pain management.  -Back pain is improving.  Left lower extremity pain and weakness is slightly improved. -Continue PT/OT.  Patient might benefit from inpatient/outpatient rehab -Fall precautions.  Hypertension -Stable.  Continue amlodipine, Lasix, losartan and hydrochlorothiazide.  Monitor blood pressure  Chronic diastolic dysfunction -Echo 3 years ago reveals ejection fraction is 85% grade 1 diastolic dysfunction. Home medications include Lasix. She does not appear volume overloaded. -Continue home meds  Chronic kidney disease stage III -Creatinine stable.  Repeat a.m. creatinine.  Discontinue IV fluids  History of breast cancer -Continue tamoxifen.  Outpatient follow-up  Hyponatremia -likelyrelated to recent surgery decreased oral intake.  -Improved.  Repeat a.m. Labs  Leukocytosis -Probably reactive.  Resolved.  Repeat a.m. labs  Anemia probably from anemia of chronic disease from chronic kidney disease -Hemoglobin stable.  Monitor  Consultations:  Neurosurgery  Discharge Exam: BP (!) 148/70 (BP Location: Right Arm)   Pulse 90   Temp 98.8 F (37.1 C) (Oral)   Resp 17   Ht 5' 3.75" (1.619 m)   Wt 75.8 kg (167 lb)  SpO2 96%   BMI 28.89 kg/m   General: 82 year old African-American female well-developed well-nourished  in no acute distress alert and oriented x4 Cardiovascular: Regular rate and rhythm with no rubs gallops or murmurs Respiratory: Clear to auscultation with no wheezes or rales  Discharge Instructions You were cared for by a hospitalist during your hospital stay. If you have any questions about your discharge medications or the care you received while you were in the hospital after you are discharged, you can call the unit and asked to speak with the hospitalist on call if the hospitalist that took care of you is not available. Once you are discharged, your primary care physician will handle any further medical issues. Please note that NO REFILLS for any discharge medications will be authorized once you are discharged, as it is imperative that you return to your primary care physician (or establish a relationship with a primary care physician if you do not have one) for your aftercare needs so that they can reassess your need for medications and monitor your lab values.   Allergies as of 12/29/2017      Reactions   Hydrocodone Itching   Iron Hives, Other (See Comments)   Whelps, bad constipation      Medication List    STOP taking these medications   HYDROcodone-acetaminophen 5-325 MG tablet Commonly known as:  NORCO/VICODIN     TAKE these medications   albuterol 108 (90 Base) MCG/ACT inhaler Commonly known as:  PROVENTIL HFA;VENTOLIN HFA Inhale 2 puffs every 6 (six) hours as needed into the lungs for wheezing or shortness of breath.   amLODipine 5 MG tablet Commonly known as:  NORVASC Take 1 tablet (5 mg total) by mouth daily.   aspirin 81 MG tablet Take 81 mg by mouth daily.   budesonide-formoterol 160-4.5 MCG/ACT inhaler Commonly known as:  SYMBICORT Inhale 2 puffs 2 (two) times daily into the lungs.   CENTRUM SILVER PO Take 1 tablet by mouth daily.   dexamethasone 4 MG tablet Commonly known as:  DECADRON Take 1 tablet (4 mg total) by mouth every 8 (eight) hours.     furosemide 20 MG tablet Commonly known as:  LASIX Take 1 tablet (20 mg total) by mouth daily as needed. What changed:  when to take this   gabapentin 100 MG capsule Commonly known as:  NEURONTIN Take 1 capsule (100 mg total) by mouth 3 (three) times daily.   GAS-X PO Take 1 tablet by mouth daily as needed (for gas).   losartan-hydrochlorothiazide 100-25 MG tablet Commonly known as:  HYZAAR Take 1 tablet by mouth daily.   meclizine 12.5 MG tablet Commonly known as:  ANTIVERT TAKE 1 TABLET THREE TIMES DAILY AS NEEDED  FOR  DIZZINESS What changed:  See the new instructions.   pantoprazole 40 MG tablet Commonly known as:  PROTONIX Take 1 tablet (40 mg total) by mouth daily.   Potassium Gluconate 550 (90 K) MG Tabs Take 550 mg by mouth daily.   solifenacin 5 MG tablet Commonly known as:  VESICARE Take 1 tablet (5 mg total) by mouth daily.   tamoxifen 20 MG tablet Commonly known as:  NOLVADEX Take 1 tablet (20 mg total) by mouth daily.   traMADol 50 MG tablet Commonly known as:  ULTRAM Take 1 tablet (50 mg total) by mouth every 6 (six) hours as needed. What changed:  reasons to take this   triamcinolone 55 MCG/ACT Aero nasal inhaler Commonly known as:  NASACORT AQ Place 2  sprays into the nose daily.   triamcinolone cream 0.1 % Commonly known as:  KENALOG Apply 1 application topically 2 (two) times daily.   Vitamin D (Ergocalciferol) 50000 units Caps capsule Commonly known as:  DRISDOL Take 1 capsule (50,000 Units total) by mouth every 7 (seven) days. What changed:  additional instructions      Allergies  Allergen Reactions  . Hydrocodone Itching  . Iron Hives and Other (See Comments)    Whelps, bad constipation   Follow-up Information    Biagio Borg, MD Follow up.   Specialties:  Internal Medicine, Radiology Contact information: [place] [address] [phone]        Eleonore Chiquito, NP Follow up in 1 week(s).    Specialty:  Neurosurgery Contact information: [place] [address] [phone]            The results of significant diagnostics from this hospitalization (including imaging, microbiology, ancillary and laboratory) are listed below for reference.    Significant Diagnostic Studies: Dg Chest 2 View  Result Date: 12/10/2017 CLINICAL DATA:  Cough and fever with wheezing EXAM: CHEST  2 VIEW COMPARISON:  September 22, 2016 FINDINGS: There is no edema or consolidation. Heart is upper normal in size with pulmonary vascularity within normal limits. No adenopathy. No bone lesions. IMPRESSION: No edema or consolidation. Electronically Signed   By: Lowella Grip III M.D.   On: 12/10/2017 12:28   Dg Lumbar Spine 2-3 Views  Result Date: 12/23/2017 CLINICAL DATA:  Intraoperative localization images EXAM: LUMBAR SPINE - 2-3 VIEW COMPARISON:  MR lumbar spine of 11/30/2007 FINDINGS: The initial cross-table lateral view shows diffuse degenerative disc disease. A needles positioned beneath the spinous process of L2 directed toward the L2-3 interspace. On the second film, an instrument is directed posteriorly directed toward the T12-L1 interspace. IMPRESSION: Localization of T12-L1 on the last image. Electronically Signed   By: Ivar Drape M.D.   On: 12/23/2017 11:16   Mr Lumbar Spine W Wo Contrast  Result Date: 12/27/2017 CLINICAL DATA:  Right thigh pain.  Recent lumbar spine surgery. EXAM: MRI LUMBAR SPINE WITHOUT AND WITH CONTRAST TECHNIQUE: Multiplanar and multiecho pulse sequences of the lumbar spine were obtained without and with intravenous contrast. CONTRAST:  52mL MULTIHANCE GADOBENATE DIMEGLUMINE 529 MG/ML IV SOLN COMPARISON:  Lumbar spine MRI 11/29/2017 FINDINGS: Segmentation:  Standard. Alignment: Levoscoliosis. Vertebrae: No fracture, evidence of discitis, or bone lesion. Status post L1-2 left laminectomy. Conus medullaris and cauda equina: Conus extends to the L1  level. The cauda equina nerve roots are clustered, with minimal peripheral contrast enhancement. Paraspinal and other soft tissues: Post surgical changes in the posterior paraspinous soft tissues. Distended urinary bladder. Disc levels: T10-T11 and T11-T12 are evaluated in the sagittal plane only. There is no large disc herniation or spinal canal stenosis. T12-L1: No stenosis L1-L2: Status post left laminectomy and microdiscectomy. Greatly decreased size of disc herniation, with small amount of residual central disc material. Mild spinal canal narrowing. L2-L3: Disc bulge and facet arthrosis with unchanged moderate spinal canal stenosis. Severe right foraminal stenosis. L3-L4: Unchanged moderate to severe spinal canal stenosis and severe right foraminal stenosis. L4-L5: Severe left neural foraminal stenosis. L5-S1: Severe right neural foraminal stenosis. Visualized sacrum: Normal. IMPRESSION: 1. Clumping of the cauda equina nerve roots is suggestive of arachnoiditis. Minimal associated contrast enhancement. No epidural abscess or hematoma. 2. Marked reduction of disc herniation at the L1-2 level following microdiscectomy. 3. Otherwise  unchanged multilevel severe degenerative disc disease with severe spinal canal and neural foraminal stenosis at multiple levels. Electronically Signed   By: Ulyses Jarred M.D.   On: 12/27/2017 05:28   Dg Chest Portable 1 View  Result Date: 12/27/2017 CLINICAL DATA:  Thigh pain short of breath EXAM: PORTABLE CHEST 1 VIEW COMPARISON:  12/10/2017, 09/22/2016 FINDINGS: Borderline to mild cardiomegaly. Patchy atelectasis at the left lung base. No pleural effusion. Aortic atherosclerosis. No pneumothorax. IMPRESSION: Borderline to mild cardiomegaly with patchy atelectasis at the left lung base. Electronically Signed   By: Donavan Foil M.D.   On: 12/27/2017 00:59    Microbiology: Recent Results (from the past 240 hour(s))  Surgical pcr screen     Status: None   Collection Time:  12/23/17  8:57 AM  Result Value Ref Range Status   MRSA, PCR NEGATIVE NEGATIVE Final   Staphylococcus aureus NEGATIVE NEGATIVE Final    Comment: (NOTE) The Xpert SA Assay (FDA approved for NASAL specimens in patients 41 years of age and older), is one component of a comprehensive surveillance program. It is not intended to diagnose infection nor to guide or monitor treatment. Performed at [facility], [place] [address], [place], [place] [address]      Labs: Basic Metabolic Panel: Recent Labs  Lab 12/23/17 0857 12/26/17 2317 12/28/17 0407 12/29/17 0614  NA 132* 129* 135 137  K 3.2* 3.5 4.0 3.6  CL 92* 91* 98* 99*  CO2 24 25 27 27   GLUCOSE 100* 137* 172* 138*  BUN 55* 58* 44* 39*  CREATININE 1.93* 1.70* 1.38* 1.22*  CALCIUM 9.1 8.8* 8.9 9.0  MG  --   --   --  1.3*   Liver Function Tests: No results for input(s): AST, ALT, ALKPHOS, BILITOT, PROT, ALBUMIN in the last 168 hours. No results for input(s): LIPASE, AMYLASE in the last 168 hours. No results for input(s): AMMONIA in the last 168 hours. CBC: Recent Labs  Lab 12/23/17 0857 12/26/17 2317 12/28/17 0407 12/29/17 0614  WBC 10.7* 12.7* 7.1 12.8*  NEUTROABS  --  10.0*  --  11.1*  HGB 10.7* 9.1* 8.0* 7.9*  HCT 33.1* 27.4* 24.8* 23.8*  MCV 97.1 96.1 96.1 96.4  PLT 195 196 180 222   Cardiac Enzymes: Recent Labs  Lab 12/26/17 2317  CKTOTAL 171   BNP: BNP (last 3 results) Recent Labs    12/26/17 2317  BNP 35.6    ProBNP (last 3 results) No results for input(s): PROBNP in the last 8760 hours.  CBG: No results for input(s): GLUCAP in the last 168 hours.     Signed:  Kayleen Memos, MD [facility] 12/29/2017, 3:02 PM

## 2017-12-29 NOTE — Care Management Note (Signed)
Case Management Note  Patient Details  Name: [name] MRN: [id number] Date of Birth: 17-Aug-1936  Subjective/Objective:                    Action/Plan: Pt discharging home with [facility] services. Pt was previously set up with [facility] and patient would like to continue with them. Butch Penny with [facility] notified and accepted the referral. Pt has DME at home. CSW updated the patients daughter and she will provide transportation home.   Expected Discharge Date:  12/29/17               Expected Discharge Plan:  Skilled Nursing Facility  In-House Referral:  Clinical Social Work  Discharge planning Services  CM Consult  Post Acute Care Choice:  Home Health Choice offered to:  Patient  DME Arranged:    DME Agency:     HH Arranged:  PT, OT, Nurse's Aide [place] Agency:  [facility]  Status of Service:  Completed, signed off  If discussed at [place] of Stay Meetings, dates discussed:    Additional Comments:  Pollie Friar, RN 12/29/2017, 3:09 PM

## 2017-12-29 NOTE — Progress Notes (Signed)
Patient discharged home with family.  Discharge information given. IV removed. Patient questions asked and answered. Patient transported from unit via wheelchair with staff. [name]

## 2017-12-30 ENCOUNTER — Telehealth: Payer: Self-pay | Admitting: *Deleted

## 2017-12-30 DIAGNOSIS — K219 Gastro-esophageal reflux disease without esophagitis: Secondary | ICD-10-CM | POA: Diagnosis not present

## 2017-12-30 DIAGNOSIS — Z853 Personal history of malignant neoplasm of breast: Secondary | ICD-10-CM | POA: Diagnosis not present

## 2017-12-30 DIAGNOSIS — J45909 Unspecified asthma, uncomplicated: Secondary | ICD-10-CM | POA: Diagnosis not present

## 2017-12-30 DIAGNOSIS — I130 Hypertensive heart and chronic kidney disease with heart failure and stage 1 through stage 4 chronic kidney disease, or unspecified chronic kidney disease: Secondary | ICD-10-CM | POA: Diagnosis not present

## 2017-12-30 DIAGNOSIS — N189 Chronic kidney disease, unspecified: Secondary | ICD-10-CM | POA: Diagnosis not present

## 2017-12-30 DIAGNOSIS — I5030 Unspecified diastolic (congestive) heart failure: Secondary | ICD-10-CM | POA: Diagnosis not present

## 2017-12-30 DIAGNOSIS — H9190 Unspecified hearing loss, unspecified ear: Secondary | ICD-10-CM | POA: Diagnosis not present

## 2017-12-30 DIAGNOSIS — Z4789 Encounter for other orthopedic aftercare: Secondary | ICD-10-CM | POA: Diagnosis not present

## 2017-12-30 DIAGNOSIS — D631 Anemia in chronic kidney disease: Secondary | ICD-10-CM | POA: Diagnosis not present

## 2017-12-30 NOTE — Telephone Encounter (Signed)
Pt was on TCM report admitted 12/26/17 for intractable back pain and inability to move. Pt had recent L1-L2 decompression and discectomy on 12/23/2017... Neurosurgery was consulted. Pt D/C 12/29/17 and will f/u w/ specialist Glenford Peers NP in week @ Neurosurgery.Marland KitchenJohny Chess

## 2017-12-31 DIAGNOSIS — K219 Gastro-esophageal reflux disease without esophagitis: Secondary | ICD-10-CM | POA: Diagnosis not present

## 2017-12-31 DIAGNOSIS — D631 Anemia in chronic kidney disease: Secondary | ICD-10-CM | POA: Diagnosis not present

## 2017-12-31 DIAGNOSIS — Z4789 Encounter for other orthopedic aftercare: Secondary | ICD-10-CM | POA: Diagnosis not present

## 2017-12-31 DIAGNOSIS — Z853 Personal history of malignant neoplasm of breast: Secondary | ICD-10-CM | POA: Diagnosis not present

## 2017-12-31 DIAGNOSIS — I130 Hypertensive heart and chronic kidney disease with heart failure and stage 1 through stage 4 chronic kidney disease, or unspecified chronic kidney disease: Secondary | ICD-10-CM | POA: Diagnosis not present

## 2017-12-31 DIAGNOSIS — J45909 Unspecified asthma, uncomplicated: Secondary | ICD-10-CM | POA: Diagnosis not present

## 2017-12-31 DIAGNOSIS — H9190 Unspecified hearing loss, unspecified ear: Secondary | ICD-10-CM | POA: Diagnosis not present

## 2017-12-31 DIAGNOSIS — I5030 Unspecified diastolic (congestive) heart failure: Secondary | ICD-10-CM | POA: Diagnosis not present

## 2017-12-31 DIAGNOSIS — N189 Chronic kidney disease, unspecified: Secondary | ICD-10-CM | POA: Diagnosis not present

## 2017-12-31 NOTE — Progress Notes (Signed)
Left msg to call back regarding appt with Dr. Tamala Julian. I went ahead and scheduled the appointment. She is an established pt with him.

## 2017-12-31 NOTE — Progress Notes (Signed)
Left msg for pt to call back. I went ahead and scheduled her an appointment with Dr. Tamala Julian. Will inform her when she calls back. She is an established pt of Dr. Tamala Julian.

## 2018-01-03 DIAGNOSIS — H9190 Unspecified hearing loss, unspecified ear: Secondary | ICD-10-CM | POA: Diagnosis not present

## 2018-01-03 DIAGNOSIS — D631 Anemia in chronic kidney disease: Secondary | ICD-10-CM | POA: Diagnosis not present

## 2018-01-03 DIAGNOSIS — I5030 Unspecified diastolic (congestive) heart failure: Secondary | ICD-10-CM | POA: Diagnosis not present

## 2018-01-03 DIAGNOSIS — K219 Gastro-esophageal reflux disease without esophagitis: Secondary | ICD-10-CM | POA: Diagnosis not present

## 2018-01-03 DIAGNOSIS — N189 Chronic kidney disease, unspecified: Secondary | ICD-10-CM | POA: Diagnosis not present

## 2018-01-03 DIAGNOSIS — Z853 Personal history of malignant neoplasm of breast: Secondary | ICD-10-CM | POA: Diagnosis not present

## 2018-01-03 DIAGNOSIS — Z4789 Encounter for other orthopedic aftercare: Secondary | ICD-10-CM | POA: Diagnosis not present

## 2018-01-03 DIAGNOSIS — J45909 Unspecified asthma, uncomplicated: Secondary | ICD-10-CM | POA: Diagnosis not present

## 2018-01-03 DIAGNOSIS — I130 Hypertensive heart and chronic kidney disease with heart failure and stage 1 through stage 4 chronic kidney disease, or unspecified chronic kidney disease: Secondary | ICD-10-CM | POA: Diagnosis not present

## 2018-01-05 DIAGNOSIS — Z9889 Other specified postprocedural states: Secondary | ICD-10-CM | POA: Diagnosis not present

## 2018-01-06 DIAGNOSIS — J45909 Unspecified asthma, uncomplicated: Secondary | ICD-10-CM | POA: Diagnosis not present

## 2018-01-06 DIAGNOSIS — H9190 Unspecified hearing loss, unspecified ear: Secondary | ICD-10-CM | POA: Diagnosis not present

## 2018-01-06 DIAGNOSIS — D631 Anemia in chronic kidney disease: Secondary | ICD-10-CM | POA: Diagnosis not present

## 2018-01-06 DIAGNOSIS — N189 Chronic kidney disease, unspecified: Secondary | ICD-10-CM | POA: Diagnosis not present

## 2018-01-06 DIAGNOSIS — Z853 Personal history of malignant neoplasm of breast: Secondary | ICD-10-CM | POA: Diagnosis not present

## 2018-01-06 DIAGNOSIS — I130 Hypertensive heart and chronic kidney disease with heart failure and stage 1 through stage 4 chronic kidney disease, or unspecified chronic kidney disease: Secondary | ICD-10-CM | POA: Diagnosis not present

## 2018-01-06 DIAGNOSIS — I5030 Unspecified diastolic (congestive) heart failure: Secondary | ICD-10-CM | POA: Diagnosis not present

## 2018-01-06 DIAGNOSIS — Z4789 Encounter for other orthopedic aftercare: Secondary | ICD-10-CM | POA: Diagnosis not present

## 2018-01-06 DIAGNOSIS — K219 Gastro-esophageal reflux disease without esophagitis: Secondary | ICD-10-CM | POA: Diagnosis not present

## 2018-01-10 ENCOUNTER — Telehealth: Payer: Self-pay

## 2018-01-10 NOTE — Telephone Encounter (Signed)
Spoke with patient, she needs a letter stating that she needs hearing aids due to decreased/worsening hearing in order for her insurance to cover paying for them. She stated that she appt this week to come in to discuss.   Copied from [place] [phone]. Topic: Quick Communication - See Telephone Encounter >> Jan 06, 2018  1:37 PM Hewitt Shorts wrote: CRM for notification. See Telephone encounter for:  Pt is needing to talk with Dr. Jenny Reichmann regarding a letter regarding her hearing aids and this letter needs to go to patients insurance [employer]  Best number [phone]   01/06/18.

## 2018-01-10 NOTE — Telephone Encounter (Signed)
Please ask pt and check chart, but do we have information from an audiology exam indicating the need for hearing aids?  If so, a letter would not be a problem.  If not, I can refer to Audiology to confirm the need for hearing aids.

## 2018-01-11 ENCOUNTER — Ambulatory Visit: Payer: Medicare HMO | Admitting: Family Medicine

## 2018-01-12 ENCOUNTER — Telehealth: Payer: Self-pay | Admitting: Internal Medicine

## 2018-01-12 MED ORDER — PANTOPRAZOLE SODIUM 40 MG PO TBEC: 40.0000 mg | DELAYED_RELEASE_TABLET | Freq: Every day | ORAL | 1 refills | Status: DC

## 2018-01-12 NOTE — Telephone Encounter (Signed)
Copied from [place] [phone]. Topic: Quick Communication - See Telephone Encounter >> Jan 12, 2018  9:46 AM Conception Chancy, NT wrote: CRM for notification. See Telephone encounter for:  01/12/18.  Patient is requesting a refill on pantoprazole (PROTONIX) 40 MG tablet   please advise.   Visual merchandiser # [phone]

## 2018-01-12 NOTE — Telephone Encounter (Signed)
Protonix refill sent to [facility] as requested. Previous refills were sent to [facility].

## 2018-01-13 ENCOUNTER — Ambulatory Visit (HOSPITAL_COMMUNITY)
Admission: RE | Admit: 2018-01-13 | Discharge: 2018-01-13 | Disposition: A | Discharge status: Home / Self Care | Payer: Medicare HMO | Source: Ambulatory Visit | Attending: Internal Medicine | Admitting: Internal Medicine

## 2018-01-13 ENCOUNTER — Encounter: Payer: Self-pay | Admitting: Internal Medicine

## 2018-01-13 ENCOUNTER — Ambulatory Visit (INDEPENDENT_AMBULATORY_CARE_PROVIDER_SITE_OTHER): Payer: Medicare HMO | Admitting: Internal Medicine

## 2018-01-13 ENCOUNTER — Other Ambulatory Visit (INDEPENDENT_AMBULATORY_CARE_PROVIDER_SITE_OTHER): Payer: Medicare HMO

## 2018-01-13 ENCOUNTER — Telehealth: Payer: Self-pay | Admitting: Internal Medicine

## 2018-01-13 VITALS — BP 122/72 | HR 73 | Temp 97.6°F | Ht 63.75 in | Wt 156.0 lb

## 2018-01-13 DIAGNOSIS — H9190 Unspecified hearing loss, unspecified ear: Secondary | ICD-10-CM | POA: Insufficient documentation

## 2018-01-13 DIAGNOSIS — M79605 Pain in left leg: Secondary | ICD-10-CM

## 2018-01-13 DIAGNOSIS — I82442 Acute embolism and thrombosis of left tibial vein: Secondary | ICD-10-CM | POA: Diagnosis not present

## 2018-01-13 DIAGNOSIS — I82812 Embolism and thrombosis of superficial veins of left lower extremities: Secondary | ICD-10-CM | POA: Diagnosis not present

## 2018-01-13 DIAGNOSIS — Z0001 Encounter for general adult medical examination with abnormal findings: Secondary | ICD-10-CM | POA: Diagnosis not present

## 2018-01-13 DIAGNOSIS — I10 Essential (primary) hypertension: Secondary | ICD-10-CM | POA: Diagnosis not present

## 2018-01-13 DIAGNOSIS — M7989 Other specified soft tissue disorders: Secondary | ICD-10-CM

## 2018-01-13 DIAGNOSIS — R7302 Impaired glucose tolerance (oral): Secondary | ICD-10-CM | POA: Diagnosis not present

## 2018-01-13 DIAGNOSIS — H9193 Unspecified hearing loss, bilateral: Secondary | ICD-10-CM | POA: Diagnosis not present

## 2018-01-13 LAB — CBC WITH DIFFERENTIAL/PLATELET
Basophils Absolute: 0 10*3/uL (ref 0.0–0.1)
Basophils Relative: 0.1 % (ref 0.0–3.0)
Eosinophils Absolute: 0.2 10*3/uL (ref 0.0–0.7)
Eosinophils Relative: 2.1 % (ref 0.0–5.0)
HCT: 30.2 % — ABNORMAL LOW (ref 36.0–46.0)
Hemoglobin: 10.3 g/dL — ABNORMAL LOW (ref 12.0–15.0)
Lymphocytes Relative: 13.6 % (ref 12.0–46.0)
Lymphs Abs: 1.3 10*3/uL (ref 0.7–4.0)
MCHC: 34.2 g/dL (ref 30.0–36.0)
MCV: 97.1 fl (ref 78.0–100.0)
Monocytes Absolute: 0.8 10*3/uL (ref 0.1–1.0)
Monocytes Relative: 8.3 % (ref 3.0–12.0)
Neutro Abs: 7.1 10*3/uL (ref 1.4–7.7)
Neutrophils Relative %: 75.9 % (ref 43.0–77.0)
Platelets: 161 10*3/uL (ref 150.0–400.0)
RBC: 3.11 Mil/uL — ABNORMAL LOW (ref 3.87–5.11)
RDW: 14.3 % (ref 11.5–15.5)
WBC: 9.4 10*3/uL (ref 4.0–10.5)

## 2018-01-13 LAB — URINALYSIS, ROUTINE W REFLEX MICROSCOPIC
Bilirubin Urine: NEGATIVE
Ketones, ur: NEGATIVE
Leukocytes, UA: NEGATIVE
Nitrite: NEGATIVE
Specific Gravity, Urine: <=1.005 — AB (ref 1.000–1.030)
Total Protein, Urine: NEGATIVE
Urine Glucose: NEGATIVE
Urobilinogen, UA: 0.2 (ref 0.0–1.0)
WBC, UA: NONE SEEN (ref 0–?)
pH: 7 (ref 5.0–8.0)

## 2018-01-13 LAB — TSH: TSH: 1.53 u[IU]/mL (ref 0.35–4.50)

## 2018-01-13 LAB — D-DIMER, QUANTITATIVE: D-Dimer, Quant: 8.38 mcg/mL FEU — ABNORMAL HIGH (ref <0.50)

## 2018-01-13 LAB — HEMOGLOBIN A1C: Hgb A1c MFr Bld: 5.8 % (ref 4.6–6.5)

## 2018-01-13 MED ORDER — APIXABAN 5 MG PO TABS: 5.0000 mg | ORAL_TABLET | Freq: Two times a day (BID) | ORAL | 5 refills | Status: DC

## 2018-01-13 NOTE — Progress Notes (Signed)
Subjective:    Patient ID: [name], female    DOB: May 30, 1936, 82 y.o.   MRN: [id number]  HPI  Here for wellness and f/u;  Overall doing ok;  Pt denies neurological change such as new headache, facial or extremity weakness.  Pt denies polydipsia, polyuria, or low sugar symptoms. Pt states overall good compliance with treatment and medications, good tolerability, and has been trying to follow appropriate diet.  Pt denies worsening depressive symptoms, suicidal ideation or panic. No fever, night sweats, wt loss, loss of appetite, or other constitutional symptoms.  Pt states good ability with ADL's, has low fall risk, home safety reviewed and adequate, no other significant changes in hearing or vision, and only occasionally active with exercise. Also pt noticed swelling new to left leg today with some soreness, was going to mention at visit, did not tell daughter with her, did just have recent surgury and Pt denies chest pain, increased sob or doe, wheezing, orthopnea, palpitations, dizziness or syncope. Has seen audiology with reduced hearing, needs repeat exam and hearing aids.  Daughter concerned about her hearing the phone and pt trying to remain independent.  Having PT twice per wk postop mar 2019.  Plans to call for f/u appt with Dr Jana Hakim soon.  No other interval hx or exam findings Past Medical History:  Diagnosis Date  . Allergic rhinitis 10/30/2016  . Anemia   . Asthma   . Breast cancer of upper-outer quadrant of left female breast ([place]) 11/08/2013   ER/PR+ Her2- Left IDC   . Chronic renal insufficiency   . Chronic rhinitis   . Colon polyp   . Diastolic dysfunction 01/16/4009  . DJD (degenerative joint disease)   . Dyspnea   . Full dentures   . GERD (gastroesophageal reflux disease)   . Hearing loss   . Hypertension   . Hyponatremia   . Impaired glucose tolerance 07/18/2014  . Memory loss   . Morbid obesity ([place])   . Poor circulation   . Vertigo   . Wears glasses     Past Surgical History:  Procedure Laterality Date  . ABDOMINAL HYSTERECTOMY    . BREAST LUMPECTOMY WITH NEEDLE LOCALIZATION AND AXILLARY SENTINEL LYMPH NODE BX Left 12/04/2013   Procedure: BREAST LUMPECTOMY WITH NEEDLE LOCALIZATION AND AXILLARY SENTINEL LYMPH NODE BX;  Surgeon: Shann Medal, MD;  Location: [facility];  Service: General;  Laterality: Left;  . CATARACT EXTRACTION  2009   rt  . COLONOSCOPY    . EYE SURGERY Bilateral    cataract surgery  . KNEE ARTHROSCOPY     both  . LUMBAR LAMINECTOMY/DECOMPRESSION MICRODISCECTOMY Left 12/23/2017   Procedure: Left Lumbar One-Two Laminectomy with microdiscectomy;  Surgeon: Eustace Moore, MD;  Location: [facility];  Service: Neurosurgery;  Laterality: Left;  Left L1-2 Laminectomy with microdiscectomy  . TONSILLECTOMY    . TOTAL KNEE ARTHROPLASTY  2002   rt  . TOTAL KNEE ARTHROPLASTY  2003   left  . VESICOVAGINAL FISTULA CLOSURE W/ TAH  1980    reports that she has never smoked. She has never used smokeless tobacco. She reports that she does not drink alcohol or use drugs. family history includes Colon cancer in her mother; Heart attack (age of onset: 36) in her son; Heart disease in her unknown relative; Lung cancer in her brother; Stomach cancer in her mother. Allergies  Allergen Reactions  . Hydrocodone Itching  . Iron Hives and Other (See Comments)    [place],  bad constipation   Current Outpatient Medications on File Prior to Visit  Medication Sig Dispense Refill  . albuterol (PROVENTIL HFA;VENTOLIN HFA) 108 (90 Base) MCG/ACT inhaler Inhale 2 puffs every 6 (six) hours as needed into the lungs for wheezing or shortness of breath. 1 Inhaler 11  . amLODipine (NORVASC) 5 MG tablet Take 1 tablet (5 mg total) by mouth daily. 90 tablet 1  . aspirin 81 MG tablet Take 81 mg by mouth daily.      . budesonide-formoterol (SYMBICORT) 160-4.5 MCG/ACT inhaler Inhale 2 puffs 2 (two) times daily into the lungs. 1 Inhaler 11  .  dexamethasone (DECADRON) 4 MG tablet Take 1 tablet (4 mg total) by mouth every 8 (eight) hours. 21 tablet 0  . furosemide (LASIX) 20 MG tablet Take 1 tablet (20 mg total) by mouth daily as needed. (Patient taking differently: Take 20 mg by mouth daily. ) 90 tablet 3  . gabapentin (NEURONTIN) 100 MG capsule Take 1 capsule (100 mg total) by mouth 3 (three) times daily. 90 capsule 5  . losartan-hydrochlorothiazide (HYZAAR) 100-25 MG tablet Take 1 tablet by mouth daily. 90 tablet 1  . meclizine (ANTIVERT) 12.5 MG tablet TAKE 1 TABLET THREE TIMES DAILY AS NEEDED  FOR  DIZZINESS (Patient taking differently: TAKE 12.5 MG THREE TIMES DAILY AS NEEDED  FOR  DIZZINESS) 270 tablet 0  . Multiple Vitamins-Minerals (CENTRUM SILVER PO) Take 1 tablet by mouth daily.      . pantoprazole (PROTONIX) 40 MG tablet Take 1 tablet (40 mg total) by mouth daily. 90 tablet 1  . Potassium Gluconate 550 (90 K) MG TABS Take 550 mg by mouth daily.     . Simethicone (GAS-X PO) Take 1 tablet by mouth daily as needed (for gas).     . solifenacin (VESICARE) 5 MG tablet Take 1 tablet (5 mg total) by mouth daily. 90 tablet 3  . tamoxifen (NOLVADEX) 20 MG tablet Take 1 tablet (20 mg total) by mouth daily. 90 tablet 3  . traMADol (ULTRAM) 50 MG tablet Take 1 tablet (50 mg total) by mouth every 6 (six) hours as needed. (Patient taking differently: Take 50 mg by mouth every 6 (six) hours as needed for moderate pain. ) 60 tablet 1  . triamcinolone (NASACORT AQ) 55 MCG/ACT AERO nasal inhaler Place 2 sprays into the nose daily. 1 Inhaler 12  . triamcinolone cream (KENALOG) 0.1 % Apply 1 application topically 2 (two) times daily. 30 g 0  . Vitamin D, Ergocalciferol, (DRISDOL) 50000 units CAPS capsule Take 1 capsule (50,000 Units total) by mouth every 7 (seven) days. (Patient taking differently: Take 50,000 Units by mouth every 7 (seven) days. On Friday) 12 capsule 0   No current facility-administered medications on file prior to visit.     Review of Systems Constitutional: Negative for other unusual diaphoresis, sweats, appetite or weight changes HENT: Negative for other worsening hearing loss, ear pain, facial swelling, mouth sores or neck stiffness.   Eyes: Negative for other worsening pain, redness or other visual disturbance.  Respiratory: Negative for other stridor or swelling Cardiovascular: Negative for other palpitations or other chest pain  Gastrointestinal: Negative for worsening diarrhea or loose stools, blood in stool, distention or other pain Genitourinary: Negative for hematuria, flank pain or other change in urine volume.  Musculoskeletal: Negative for myalgias or other joint swelling.  Skin: Negative for other color change, or other wound or worsening drainage.  Neurological: Negative for other syncope or numbness. Hematological: Negative for other adenopathy  or swelling Psychiatric/Behavioral: Negative for hallucinations, other worsening agitation, SI, self-injury, or new decreased concentration All other system neg per pt    Objective:   Physical Exam BP 122/72   Pulse 73   Temp 97.6 F (36.4 C) (Oral)   Ht 5' 3.75" (1.619 m)   Wt 156 lb (70.8 kg)   SpO2 98%   BMI 26.99 kg/m  VS noted,  Constitutional: Pt is oriented to person, place, and time. Appears well-developed and well-nourished, in no significant distress and comfortable Head: Normocephalic and atraumatic  Eyes: Conjunctivae and EOM are normal. Pupils are equal, round, and reactive to light Right Ear: External ear normal without discharge Left Ear: External ear normal without discharge Nose: Nose without discharge or deformity Mouth/Throat: Oropharynx is without other ulcerations and moist  Neck: Normal range of motion. Neck supple. No JVD present. No tracheal deviation present or significant neck LA or mass Cardiovascular: Normal rate, regular rhythm, normal heart sounds and intact distal pulses.   Pulmonary/Chest: WOB normal and  breath sounds without rales or wheezing  Abdominal: Soft. Bowel sounds are normal. NT. No HSM  Musculoskeletal: Normal range of motion. Lymphadenopathy: Has no other cervical adenopathy.  Neurological: Pt is alert and oriented to person, place, and time. Pt has normal reflexes. No cranial nerve deficit. Motor grossly intact, Gait intact Skin: Skin is warm and dry. No rash noted or new ulcerations Psychiatric:  Has normal mood and affect. Behavior is normal without agitation LLE with 2-3+ edema and mild post calf soreness, cannot appreciate cords     Assessment & Plan:

## 2018-01-13 NOTE — Progress Notes (Signed)
Left lower extremity venous duplex has been completed. There is evidence of acute deep vein thrombosis involving the posterior tibial, and peroneal veins of the left lower extremity. There is also evidence of acute superficial vein thrombosis involving the proximal great saphenous vein of the left lower extremity. Results were given to [facility] at Dr. Gwynn Burly office.  01/13/18 4:40 PM [name] RVT

## 2018-01-13 NOTE — Telephone Encounter (Signed)
Ok to start eliquis 5 mg bid  Tx should be for 6 months  Please have pt f/u in 2 wks with ROV, or sooner if needed

## 2018-01-13 NOTE — Telephone Encounter (Signed)
Pt has been informed and has scheduled an appt for 01/31/18 at 8am

## 2018-01-13 NOTE — Patient Instructions (Signed)
You will be contacted regarding the referral for: left leg venous doppler testing (to see [facility] now)  You will be contacted regarding the referral for: Audiology for hearing aids  Please continue all other medications as before, and refills have been done if requested.  Please have the pharmacy call with any other refills you may need.  Please continue your efforts at being more active, low cholesterol diet, and weight control.  You are otherwise up to date with prevention measures today.  Please keep your appointments with your specialists as you may have planned  Please go to the LAB in the Basement (turn left off the elevator) for the tests to be done today  You will be contacted by phone if any changes need to be made immediately.  Otherwise, you will receive a letter about your results with an explanation, but please check with MyChart first.  Please remember to sign up for MyChart if you have not done so, as this will be important to you in the future with finding out test results, communicating by private email, and scheduling acute appointments online when needed.  Please return in 6 months, or sooner if needed

## 2018-01-14 ENCOUNTER — Encounter: Payer: Self-pay | Admitting: Internal Medicine

## 2018-01-14 DIAGNOSIS — H9190 Unspecified hearing loss, unspecified ear: Secondary | ICD-10-CM | POA: Diagnosis not present

## 2018-01-14 DIAGNOSIS — Z853 Personal history of malignant neoplasm of breast: Secondary | ICD-10-CM | POA: Diagnosis not present

## 2018-01-14 DIAGNOSIS — D631 Anemia in chronic kidney disease: Secondary | ICD-10-CM | POA: Diagnosis not present

## 2018-01-14 DIAGNOSIS — J45909 Unspecified asthma, uncomplicated: Secondary | ICD-10-CM | POA: Diagnosis not present

## 2018-01-14 DIAGNOSIS — N189 Chronic kidney disease, unspecified: Secondary | ICD-10-CM | POA: Diagnosis not present

## 2018-01-14 DIAGNOSIS — K219 Gastro-esophageal reflux disease without esophagitis: Secondary | ICD-10-CM | POA: Diagnosis not present

## 2018-01-14 DIAGNOSIS — Z4789 Encounter for other orthopedic aftercare: Secondary | ICD-10-CM | POA: Diagnosis not present

## 2018-01-14 DIAGNOSIS — I130 Hypertensive heart and chronic kidney disease with heart failure and stage 1 through stage 4 chronic kidney disease, or unspecified chronic kidney disease: Secondary | ICD-10-CM | POA: Diagnosis not present

## 2018-01-14 DIAGNOSIS — I5030 Unspecified diastolic (congestive) heart failure: Secondary | ICD-10-CM | POA: Diagnosis not present

## 2018-01-14 LAB — LIPID PANEL
Cholesterol: 150 mg/dL (ref 0–200)
HDL: 61.1 mg/dL (ref >39.00)
LDL Cholesterol: 67 mg/dL (ref 0–99)
NonHDL: 89.1
Total CHOL/HDL Ratio: 2
Triglycerides: 111 mg/dL (ref 0.0–149.0)
VLDL: 22.2 mg/dL (ref 0.0–40.0)

## 2018-01-14 LAB — BASIC METABOLIC PANEL
BUN: 41 mg/dL — ABNORMAL HIGH (ref 6–23)
CO2: 28 mEq/L (ref 19–32)
Calcium: 9.1 mg/dL (ref 8.4–10.5)
Chloride: 91 mEq/L — ABNORMAL LOW (ref 96–112)
Creatinine, Ser: 1.31 mg/dL — ABNORMAL HIGH (ref 0.40–1.20)
GFR: 50.21 mL/min — ABNORMAL LOW (ref >60.00)
Glucose, Bld: 101 mg/dL — ABNORMAL HIGH (ref 70–99)
Potassium: 3.8 mEq/L (ref 3.5–5.1)
Sodium: 133 mEq/L — ABNORMAL LOW (ref 135–145)

## 2018-01-14 LAB — HEPATIC FUNCTION PANEL
ALT: 20 U/L (ref 0–35)
AST: 20 U/L (ref 0–37)
Albumin: 3.7 g/dL (ref 3.5–5.2)
Alkaline Phosphatase: 35 U/L — ABNORMAL LOW (ref 39–117)
Bilirubin, Direct: 0.1 mg/dL (ref 0.0–0.3)
Total Bilirubin: 0.5 mg/dL (ref 0.2–1.2)
Total Protein: 6.3 g/dL (ref 6.0–8.3)

## 2018-01-15 NOTE — Assessment & Plan Note (Signed)
stable overall by history and exam, recent data reviewed with pt, and pt to continue medical treatment as before,  to f/u any worsening symptoms or concerns Lab Results  Component Value Date   HGBA1C 5.8 01/13/2018

## 2018-01-15 NOTE — Assessment & Plan Note (Addendum)
Etiology unclear but high suspicion for acute DVT by hx only onset this am, for stat venous doppler, consider anticoag if abnormal  In addition to the time spent performing CPE, I spent an additional 25 minutes face to face,in which greater than 50% of this time was spent in counseling and coordination of care for patient's acute illness as documented, including the differential dx, treatment, further evaluation and other management of left leg pain and swelling, hearing loss, hyperglycemia and HTN

## 2018-01-15 NOTE — Assessment & Plan Note (Signed)

## 2018-01-15 NOTE — Assessment & Plan Note (Signed)
[place] for referral to audiology, likely needs hearing aids

## 2018-01-15 NOTE — Assessment & Plan Note (Signed)
stable overall by history and exam, recent data reviewed with pt, and pt to continue medical treatment as before,  to f/u any worsening symptoms or concerns BP Readings from Last 3 Encounters:  01/13/18 122/72  12/29/17 136/68  12/24/17 124/72

## 2018-01-15 NOTE — Assessment & Plan Note (Signed)
Likely dvt - for test as above

## 2018-01-17 ENCOUNTER — Encounter (HOSPITAL_COMMUNITY): Payer: Self-pay | Admitting: Emergency Medicine

## 2018-01-17 ENCOUNTER — Emergency Department (HOSPITAL_BASED_OUTPATIENT_CLINIC_OR_DEPARTMENT_OTHER)
Admit: 2018-01-17 | Discharge: 2018-01-17 | Disposition: A | Discharge status: Home / Self Care | Payer: Medicare HMO | Attending: Emergency Medicine | Admitting: Emergency Medicine

## 2018-01-17 ENCOUNTER — Emergency Department (HOSPITAL_COMMUNITY)
Admission: EM | Admit: 2018-01-17 | Discharge: 2018-01-17 | Disposition: A | Discharge status: Home / Self Care | Payer: Medicare HMO | Attending: Emergency Medicine | Admitting: Emergency Medicine

## 2018-01-17 ENCOUNTER — Ambulatory Visit: Payer: Self-pay

## 2018-01-17 ENCOUNTER — Telehealth: Payer: Self-pay | Admitting: Internal Medicine

## 2018-01-17 DIAGNOSIS — Z5321 Procedure and treatment not carried out due to patient leaving prior to being seen by health care provider: Secondary | ICD-10-CM | POA: Insufficient documentation

## 2018-01-17 DIAGNOSIS — M25474 Effusion, right foot: Secondary | ICD-10-CM | POA: Diagnosis not present

## 2018-01-17 DIAGNOSIS — M7989 Other specified soft tissue disorders: Secondary | ICD-10-CM

## 2018-01-17 DIAGNOSIS — R2242 Localized swelling, mass and lump, left lower limb: Secondary | ICD-10-CM | POA: Diagnosis present

## 2018-01-17 DIAGNOSIS — Z7901 Long term (current) use of anticoagulants: Secondary | ICD-10-CM | POA: Insufficient documentation

## 2018-01-17 LAB — COMPREHENSIVE METABOLIC PANEL
ALT: 20 U/L (ref 14–54)
AST: 24 U/L (ref 15–41)
Albumin: 3.2 g/dL — ABNORMAL LOW (ref 3.5–5.0)
Alkaline Phosphatase: 39 U/L (ref 38–126)
Anion gap: 10 (ref 5–15)
BUN: 34 mg/dL — ABNORMAL HIGH (ref 6–20)
CO2: 28 mmol/L (ref 22–32)
Calcium: 8.9 mg/dL (ref 8.9–10.3)
Chloride: 95 mmol/L — ABNORMAL LOW (ref 101–111)
Creatinine, Ser: 1.29 mg/dL — ABNORMAL HIGH (ref 0.44–1.00)
GFR calc Af Amer: 44 mL/min — ABNORMAL LOW (ref >60)
GFR calc non Af Amer: 38 mL/min — ABNORMAL LOW (ref >60)
Glucose, Bld: 97 mg/dL (ref 65–99)
Potassium: 3.7 mmol/L (ref 3.5–5.1)
Sodium: 133 mmol/L — ABNORMAL LOW (ref 135–145)
Total Bilirubin: 0.7 mg/dL (ref 0.3–1.2)
Total Protein: 5.9 g/dL — ABNORMAL LOW (ref 6.5–8.1)

## 2018-01-17 LAB — CBC WITH DIFFERENTIAL/PLATELET
Basophils Absolute: 0 10*3/uL (ref 0.0–0.1)
Basophils Relative: 0 %
Eosinophils Absolute: 0.2 10*3/uL (ref 0.0–0.7)
Eosinophils Relative: 2 %
HCT: 28 % — ABNORMAL LOW (ref 36.0–46.0)
Hemoglobin: 9.1 g/dL — ABNORMAL LOW (ref 12.0–15.0)
Lymphocytes Relative: 21 %
Lymphs Abs: 2.1 10*3/uL (ref 0.7–4.0)
MCH: 31.5 pg (ref 26.0–34.0)
MCHC: 32.5 g/dL (ref 30.0–36.0)
MCV: 96.9 fL (ref 78.0–100.0)
Monocytes Absolute: 0.5 10*3/uL (ref 0.1–1.0)
Monocytes Relative: 5 %
Neutro Abs: 7.1 10*3/uL (ref 1.7–7.7)
Neutrophils Relative %: 72 %
Platelets: 149 10*3/uL — ABNORMAL LOW (ref 150–400)
RBC: 2.89 MIL/uL — ABNORMAL LOW (ref 3.87–5.11)
RDW: 13.6 % (ref 11.5–15.5)
WBC: 9.9 10*3/uL (ref 4.0–10.5)

## 2018-01-17 LAB — APTT: aPTT: 25 seconds (ref 24–36)

## 2018-01-17 LAB — PROTIME-INR
INR: 1.35
Prothrombin Time: 16.6 seconds — ABNORMAL HIGH (ref 11.4–15.2)

## 2018-01-17 NOTE — ED Notes (Signed)
Called patient numerous times, no response.

## 2018-01-17 NOTE — ED Triage Notes (Signed)
Has been taking eliquis since last Thursday.

## 2018-01-17 NOTE — Telephone Encounter (Signed)
Please schedule a ROV for patient

## 2018-01-17 NOTE — Progress Notes (Signed)
LE venous duplex prelim: Left posterior tibial and peroneal vein DVT appears unchanged from 3/21 study. Short segment of DVT noted in the Right posterior tibial veins. (RLE was not imaged on 3/21). Landry Mellow, RDMS, RVT Called results to [place], [place]

## 2018-01-17 NOTE — Telephone Encounter (Signed)
[facility] and spoke to the [facility] on 01/15/2018 at 8:03pm.  Patient stated that she noticed a spot of red blood on her underwear. She mentioned that she had some test run on Thursday which found blood clots in her left leg. She thinks it may be rectal bleeding and is currently on a blood thinner. She was advised to go to the emergency room but declined.  Please advise.

## 2018-01-17 NOTE — Telephone Encounter (Signed)
Rec'd call from [place] w/[facility] stating she has pt daughter on the phone. She is upset that mom was advise to go to ED, but she states they have been there and no one has done anything. Courtney connected me w/daughter [name]) she states mom has been here over an hour in a half, and no one has done anything, and they are wanting to know what MD want mom to do. Inform pt/daughter to ask to speak w/the charge nurse and let her know what is going on. Mom dx 01/13/18 w/DVT. Started on eliquis that afternoon, but now she is having some swelling in her feet and ankle. While being on the phone daughter states that the ED was crowded... All she see is black, brown old people waiting to be seen. No one is helping anyone. Eventually nurse came to take mom/daughter back to be triage. Advised daughter to stay, and I apologize for what is happening. Also inform her once mom has been release she can contact me and I will get her set=up for ED f/u w/Dr. Jenny Reichmann.Marland KitchenJohny Chess

## 2018-01-17 NOTE — Telephone Encounter (Signed)
Please for ROV, may need urinalysis to r/o hematuria and/or rectal bleeding

## 2018-01-17 NOTE — ED Triage Notes (Signed)
Pt to ER concerned for swelling in left leg that is worsened from last week when she was told she has a DVT in the left leg.

## 2018-01-17 NOTE — Telephone Encounter (Signed)
Pt calling with c/o increased swelling to both legs and feet left leg and foot greater than right leg and foot. Pt states there is swelling above the knee to the left leg.  Pt was dx with DVT 01/13/18 and was started on Eliquis 01/13/17.  Pt states left foot looks red and infected. Disposition was to go to [facility]. Called PCP office with pt complaints and NT and PCP felt that pt should be seen in the ED to see if DVT have worsened or increased in size. Pt advised to go to ED ASAP and have another person drive her. Pt is agreeable.    Reason for Disposition . SEVERE leg swelling (e.g., swelling extends above knee, entire leg is swollen, weeping fluid)    Left leg  is worse than the right leg . [1] Thigh, calf, or ankle swelling AND [2] bilateral AND [3] 1 side is more swollen  Answer Assessment - Initial Assessment Questions 1. ONSET: "When did the swelling start?" (e.g., minutes, hours, days)     Saturday sunday 2. LOCATION: "What part of the leg is swollen?"  "Are both legs swollen or just one leg?"     Both feet and legs and left leg and foot is more swollen 3. SEVERITY: "How bad is the swelling?" (e.g., localized; mild, moderate, severe)  - Localized - small area of swelling localized to one leg  - MILD pedal edema - swelling limited to foot and ankle, pitting edema < 1/4 inch (6 mm) deep, rest and elevation eliminate most or all swelling  - MODERATE edema - swelling of lower leg to knee, pitting edema > 1/4 inch (6 mm) deep, rest and elevation only partially reduce swelling  - SEVERE edema - swelling extends above knee, facial or hand swelling present      Severe goes above knee 4. REDNESS: "Does the swelling look red or infected?" Left looks red and infection 5. PAIN: "Is the swelling painful to touch?" If so, ask: "How painful is it?"   (Scale 1-10; mild, moderate or severe)     no 6. FEVER: "Do you have a fever?" If so, ask: "What is it, how was it measured, and when did it start?"       no 7. CAUSE: "What do you think is causing the leg swelling?"     Pt does not know. Has had swelling before but is not sure if the Eliquis is causing it.  8. MEDICAL HISTORY: "Do you have a history of heart failure, kidney disease, liver failure, or cancer?"     Breast cancer  5 years ago now in remission 9. RECURRENT SYMPTOM: "Have you had leg swelling before?" If so, ask: "When was the last time?" "What happened that time?"     Has had swelling but the foot swelling was not as bad since then until now- last time was in December 10. OTHER SYMPTOMS: "Do you have any other symptoms?" (e.g., chest pain, difficulty breathing)       no 11. PREGNANCY: "Is there any chance you are pregnant?" "When was your last menstrual period?"       n/a  Protocols used: LEG SWELLING AND EDEMA-A-AH

## 2018-01-17 NOTE — Telephone Encounter (Signed)
FYI

## 2018-01-17 NOTE — ED Provider Notes (Cosign Needed)
Patient placed in Quick Look pathway, seen and evaluated   Chief Complaint: left leg swelling and pain  HPI:   Patient states she was diagnosed with DVT in the left lower extremity on March 21.  She was started on Eliquis at that time.  She states she has been compliant.  Complains of increasing swelling and pain to the left lower extremity.  For the last few days has also had swelling to the right foot and lower leg. Denies shortness of breath, chest pain, fever, neuro deficits, trauma.  ROS: Lower extremity swelling and pain (one)  Physical Exam:   Gen: No distress  Neuro: Awake and Alert  Skin: Warm    Focused Exam:   No diaphoresis.  No pallor.  Pulmonary: No increased work of breathing.  Speaks in full sentences without difficulty.  No tachypnea.  Cardiac: Normal rate and regular. Peripheral pulses intact.  Neurologic: Sensation appears to be intact in the bilateral lower extremities.  Strength 5/5 in the bilateral knees and ankles.  MSK: Tenderness and some edema noted to the left lower extremity in the area of the calf and left foot.  Some edema also noted to the right foot into the right lower calf, but without noted tenderness.   Initiation of care has begun. The patient has been counseled on the process, plan, and necessity for staying for the completion/evaluation, and the remainder of the medical screening examination   Lorayne Bender, PA-C 01/17/18 1433

## 2018-01-18 ENCOUNTER — Other Ambulatory Visit: Payer: Self-pay

## 2018-01-18 ENCOUNTER — Encounter (HOSPITAL_COMMUNITY): Payer: Self-pay

## 2018-01-18 ENCOUNTER — Ambulatory Visit: Payer: Self-pay

## 2018-01-18 ENCOUNTER — Emergency Department (HOSPITAL_COMMUNITY)
Admission: EM | Admit: 2018-01-18 | Discharge: 2018-01-18 | Disposition: A | Discharge status: Home / Self Care | Payer: Medicare HMO | Attending: Emergency Medicine | Admitting: Emergency Medicine

## 2018-01-18 DIAGNOSIS — I82403 Acute embolism and thrombosis of unspecified deep veins of lower extremity, bilateral: Secondary | ICD-10-CM

## 2018-01-18 DIAGNOSIS — I10 Essential (primary) hypertension: Secondary | ICD-10-CM | POA: Insufficient documentation

## 2018-01-18 DIAGNOSIS — Z7982 Long term (current) use of aspirin: Secondary | ICD-10-CM | POA: Insufficient documentation

## 2018-01-18 DIAGNOSIS — Z79899 Other long term (current) drug therapy: Secondary | ICD-10-CM | POA: Diagnosis not present

## 2018-01-18 DIAGNOSIS — I824Z3 Acute embolism and thrombosis of unspecified deep veins of distal lower extremity, bilateral: Secondary | ICD-10-CM | POA: Insufficient documentation

## 2018-01-18 DIAGNOSIS — M79662 Pain in left lower leg: Secondary | ICD-10-CM | POA: Diagnosis present

## 2018-01-18 DIAGNOSIS — J45909 Unspecified asthma, uncomplicated: Secondary | ICD-10-CM | POA: Diagnosis not present

## 2018-01-18 DIAGNOSIS — Z853 Personal history of malignant neoplasm of breast: Secondary | ICD-10-CM | POA: Insufficient documentation

## 2018-01-18 MED ORDER — METAXALONE 800 MG PO TABS
400.0000 mg | ORAL_TABLET | Freq: Once | ORAL | Status: AC
  Administered 2018-01-18: 400 mg via ORAL
  Filled 2018-01-18: qty 1

## 2018-01-18 MED ORDER — METHOCARBAMOL 500 MG PO TABS: 500.0000 mg | ORAL_TABLET | Freq: Three times a day (TID) | ORAL | 0 refills | Status: DC | PRN

## 2018-01-18 NOTE — ED Triage Notes (Signed)
Pt was here yesterday and LWBS. Pt states she was diagnosed with a blood clot in her left leg last week but the pain and swelling has persistently gotten worse. Pt has been taking eloquis as prescribed. Pt denies shortness of breath. No distress noted.

## 2018-01-18 NOTE — ED Notes (Addendum)
Pt departed in NAD. Unable to obtain signature d/t no signature pad available in hallway.

## 2018-01-18 NOTE — ED Provider Notes (Signed)
[facility] EMERGENCY DEPARTMENT Provider Note   CSN: [id number] Arrival date & time: 01/18/18  2585     History   Chief Complaint No chief complaint on file.   HPI [name] is a 82 y.o. female.  HPI Patient presents with pain in her lower legs particular left lower leg.  Diagnosed with DVT around a week ago by her PCP.  Had had some swelling a few days before that.  Around 1 month ago had back surgery.  Had some leg swelling at that time but have resolved after it.  Has some  knee pain over the last few months.  Had been started on Eliquis and has had a few days of it.  Seen in the ER yesterday and had ultrasound that was not actually seen due to the weight.  Ultrasound shows stable clots.  Returns today for continued pain.  Had been started on pain medicines by her primary care doctor and states the pain medicines did not work but she was given a muscle relaxer that did help.  She does not know what this is. Past Medical History:  Diagnosis Date  . Allergic rhinitis 10/30/2016  . Anemia   . Asthma   . Breast cancer of upper-outer quadrant of left female breast ([place]) 11/08/2013   ER/PR+ Her2- Left IDC   . Chronic renal insufficiency   . Chronic rhinitis   . Colon polyp   . Diastolic dysfunction 2/77/8242  . DJD (degenerative joint disease)   . Dyspnea   . Full dentures   . GERD (gastroesophageal reflux disease)   . Hearing loss   . Hypertension   . Hyponatremia   . Impaired glucose tolerance 07/18/2014  . Memory loss   . Morbid obesity ([place])   . Poor circulation   . Vertigo   . Wears glasses     Patient Active Problem List   Diagnosis Date Noted  . Left leg pain 01/13/2018  . Left leg swelling 01/13/2018  . Hearing loss 01/13/2018  . Intractable pain 12/27/2017  . Arachnoiditis 12/27/2017  . Hyponatremia   . S/P lumbar laminectomy 12/23/2017  . Wheezing 12/10/2017  . Bilateral knee pain 12/10/2017  . Itching 11/22/2017  . Urinary  frequency 11/22/2017  . Degenerative disc disease, lumbar 11/05/2017  . Left sided sciatica 09/04/2017  . Asthma exacerbation 08/31/2017  . Rheumatoid arthritis flare ([place]) 05/11/2017  . Acute gouty arthritis 05/01/2017  . Vertigo 10/30/2016  . Allergic rhinitis 10/30/2016  . Bowel incontinence 07/31/2016  . Hemorrhoids 07/31/2016  . Hematochezia 07/31/2016  . Chronic venous insufficiency 07/10/2016  . Diastolic dysfunction 35/36/1443  . Peripheral edema 07/10/2016  . Left ankle pain 06/02/2016  . Neck pain 03/10/2016  . Low back pain 03/10/2016  . Right cervical radiculopathy 11/13/2015  . Bradycardia 03/15/2015  . Arthralgia 12/18/2014  . Fever 11/27/2014  . Rheumatoid arthritis ([place]) 10/23/2014  . Impaired glucose tolerance 07/18/2014  . Right lumbar radiculopathy 07/18/2014  . Encounter for well adult exam with abnormal findings 02/20/2014  . Breast cancer of upper-outer quadrant of left female breast ([place]) 11/08/2013  . Diverticulosis 07/09/2012  . Headache(784.0) 06/30/2012  . Anemia 06/29/2012  . Dyspnea 03/11/2012  . Cough 03/11/2012  . Chronic renal failure 05/18/2011  . GASTROESOPHAGEAL REFLUX DISEASE 07/05/2008  . VITAMIN D DEFICIENCY 12/19/2007  . Morbid obesity ([place]) 12/16/2007  . Essential hypertension 12/16/2007  . DEGENERATIVE JOINT DISEASE 12/16/2007    Past Surgical History:  Procedure Laterality Date  .  ABDOMINAL HYSTERECTOMY    . BREAST LUMPECTOMY WITH NEEDLE LOCALIZATION AND AXILLARY SENTINEL LYMPH NODE BX Left 12/04/2013   Procedure: BREAST LUMPECTOMY WITH NEEDLE LOCALIZATION AND AXILLARY SENTINEL LYMPH NODE BX;  Surgeon: Shann Medal, MD;  Location: [facility];  Service: General;  Laterality: Left;  . CATARACT EXTRACTION  2009   rt  . COLONOSCOPY    . EYE SURGERY Bilateral    cataract surgery  . KNEE ARTHROSCOPY     both  . LUMBAR LAMINECTOMY/DECOMPRESSION MICRODISCECTOMY Left 12/23/2017   Procedure: Left Lumbar One-Two  Laminectomy with microdiscectomy;  Surgeon: Eustace Moore, MD;  Location: [facility];  Service: Neurosurgery;  Laterality: Left;  Left L1-2 Laminectomy with microdiscectomy  . TONSILLECTOMY    . TOTAL KNEE ARTHROPLASTY  2002   rt  . TOTAL KNEE ARTHROPLASTY  2003   left  . VESICOVAGINAL FISTULA CLOSURE W/ TAH  1980     OB History   None      Home Medications    Prior to Admission medications   Medication Sig Start Date End Date Taking? Authorizing Provider  albuterol (PROVENTIL HFA;VENTOLIN HFA) 108 (90 Base) MCG/ACT inhaler Inhale 2 puffs every 6 (six) hours as needed into the lungs for wheezing or shortness of breath. 08/31/17   Biagio Borg, MD  amLODipine (NORVASC) 5 MG tablet Take 1 tablet (5 mg total) by mouth daily. 06/30/17   Biagio Borg, MD  apixaban (ELIQUIS) 5 MG TABS tablet Take 1 tablet (5 mg total) by mouth 2 (two) times daily. 01/13/18   Biagio Borg, MD  aspirin 81 MG tablet Take 81 mg by mouth daily.      [provider]  budesonide-formoterol (SYMBICORT) 160-4.5 MCG/ACT inhaler Inhale 2 puffs 2 (two) times daily into the lungs. 08/31/17   Biagio Borg, MD  dexamethasone (DECADRON) 4 MG tablet Take 1 tablet (4 mg total) by mouth every 8 (eight) hours. 12/29/17   Kayleen Memos, DO  furosemide (LASIX) 20 MG tablet Take 1 tablet (20 mg total) by mouth daily as needed. Patient taking differently: Take 20 mg by mouth daily.  11/22/17   Biagio Borg, MD  gabapentin (NEURONTIN) 100 MG capsule Take 1 capsule (100 mg total) by mouth 3 (three) times daily. 11/29/17   Biagio Borg, MD  losartan-hydrochlorothiazide (HYZAAR) 100-25 MG tablet Take 1 tablet by mouth daily. 11/22/17   Biagio Borg, MD  meclizine (ANTIVERT) 12.5 MG tablet TAKE 1 TABLET THREE TIMES DAILY AS NEEDED  FOR  DIZZINESS Patient taking differently: TAKE 12.5 MG THREE TIMES DAILY AS NEEDED  FOR  DIZZINESS 09/06/17   Biagio Borg, MD  methocarbamol (ROBAXIN) 500 MG tablet Take 1 tablet (500 mg total) by mouth  every 8 (eight) hours as needed for muscle spasms. 01/18/18   Davonna Belling, MD  Multiple Vitamins-Minerals (CENTRUM SILVER PO) Take 1 tablet by mouth daily.      [provider]  pantoprazole (PROTONIX) 40 MG tablet Take 1 tablet (40 mg total) by mouth daily. 01/12/18   Biagio Borg, MD  Potassium Gluconate 550 (90 K) MG TABS Take 550 mg by mouth daily.  10/31/15   Magrinat, [name], MD  Simethicone (GAS-X PO) Take 1 tablet by mouth daily as needed (for gas).     [provider]  solifenacin (VESICARE) 5 MG tablet Take 1 tablet (5 mg total) by mouth daily. 11/22/17   Biagio Borg, MD  tamoxifen (NOLVADEX) 20  MG tablet Take 1 tablet (20 mg total) by mouth daily. 08/09/17   Magrinat, [name], MD  traMADol (ULTRAM) 50 MG tablet Take 1 tablet (50 mg total) by mouth every 6 (six) hours as needed. Patient taking differently: Take 50 mg by mouth every 6 (six) hours as needed for moderate pain.  12/10/17   Biagio Borg, MD  triamcinolone (NASACORT AQ) 55 MCG/ACT AERO nasal inhaler Place 2 sprays into the nose daily. 10/30/16   Biagio Borg, MD  triamcinolone cream (KENALOG) 0.1 % Apply 1 application topically 2 (two) times daily. 11/22/17 11/22/18  Biagio Borg, MD  Vitamin D, Ergocalciferol, (DRISDOL) 50000 units CAPS capsule Take 1 capsule (50,000 Units total) by mouth every 7 (seven) days. Patient taking differently: Take 50,000 Units by mouth every 7 (seven) days. On Friday 11/05/17   Lyndal Pulley, DO    Family History Family History  Problem Relation Age of Onset  . Colon cancer Mother        in her 9's  . Stomach cancer Mother   . Heart disease Unknown        remote family  . Lung cancer Brother        was a smoker  . Heart attack Son 46    Social History Social History   Tobacco Use  . Smoking status: Never Smoker  . Smokeless tobacco: Never Used  Substance Use Topics  . Alcohol use: No    Alcohol/week: 0.0 oz  . Drug use: No     Allergies     Hydrocodone and Iron   Review of Systems Review of Systems  Constitutional: Negative for appetite change and fever.  HENT: Negative for congestion.   Respiratory: Negative for shortness of breath.   Cardiovascular: Positive for leg swelling. Negative for chest pain.  Gastrointestinal: Negative for abdominal pain.  Genitourinary: Negative for flank pain.  Musculoskeletal: Negative for back pain.       Bilateral knee pain.  Skin: Negative for rash.  Neurological: Negative for weakness.  Psychiatric/Behavioral: Negative for confusion.     Physical Exam Updated Vital Signs BP 122/71 (BP Location: Right Arm)   Pulse 76   Temp 98.5 F (36.9 C)   Resp 17   Ht '5\' 3"'$  (1.6 m)   Wt 70.3 kg (155 lb)   SpO2 98%   BMI 27.46 kg/m   Physical Exam  Constitutional: She appears well-developed.  HENT:  Head: Atraumatic.  Eyes: Pupils are equal, round, and reactive to light.  Cardiovascular: Normal rate.  Pulmonary/Chest: Effort normal and breath sounds normal.  Abdominal: Bowel sounds are normal.  Musculoskeletal: She exhibits tenderness.  Pitting edema bilateral lower extremities.  Worse on left side.  No erythema.  Pulses intact.  Scars on knees from bilateral knee replacement.  Neurological: She is alert.  Skin: Skin is warm.     ED Treatments / Results  Labs (all labs ordered are listed, but only abnormal results are displayed) Labs Reviewed - No data to display  EKG None  Radiology No results found.  Procedures Procedures (including critical care time)  Medications Ordered in ED Medications  metaxalone (SKELAXIN) tablet 400 mg (400 mg Oral Given 01/18/18 1957)     Initial Impression / Assessment and Plan / ED Course  I have reviewed the triage vital signs and the nursing notes.  Pertinent labs & imaging results that were available during my care of the patient were reviewed by me and considered in my medical  decision making (see chart for details).      Patient with pain from her DVTs.  Found to have DVT in left leg which is unchanged and also has one on right knee.  It was not previously imaged.  Doubt pulmonary embolism.  Sizes are similar from recent Doppler.  Continue Eliquis.  Will give muscle relaxer for pain since that has helped.  Will also give vascular surgery resources for follow-up.  Final Clinical Impressions(s) / ED Diagnoses   Final diagnoses:  Acute deep vein thrombosis (DVT) of both lower extremities, unspecified vein [facility])    ED Discharge Orders        Ordered    methocarbamol (ROBAXIN) 500 MG tablet  Every 8 hours PRN     01/18/18 1930       Davonna Belling, MD 01/18/18 2006

## 2018-01-18 NOTE — Telephone Encounter (Signed)
FYI

## 2018-01-18 NOTE — Telephone Encounter (Signed)
Daughter and pt.  Requesting an office visit today. State were in ED yesterday and "had a bad experience - we were there 5 hours and I was having a lot of pain." Office reports pt. Left ED before completing her workup for her DVT with swelling and pain. Recommended by provider to return to ED and complete evaluation. Pt. And daughter.[name], report they will return to ED.  Reason for Disposition . Patient sounds very sick or weak to the triager  Answer Assessment - Initial Assessment Questions 1. ONSET: "When did the swelling start?" (e.g., minutes, hours, days)     Started last week 2. LOCATION: "What part of the leg is swollen?"  "Are both legs swollen or just one leg?"     Both legs swollen to the knee 3. SEVERITY: "How bad is the swelling?" (e.g., localized; mild, moderate, severe)  - Localized - small area of swelling localized to one leg  - MILD pedal edema - swelling limited to foot and ankle, pitting edema < 1/4 inch (6 mm) deep, rest and elevation eliminate most or all swelling  - MODERATE edema - swelling of lower leg to knee, pitting edema > 1/4 inch (6 mm) deep, rest and elevation only partially reduce swelling  - SEVERE edema - swelling extends above knee, facial or hand swelling present      Moderate 4. REDNESS: "Does the swelling look red or infected?"     No 5. PAIN: "Is the swelling painful to touch?" If so, ask: "How painful is it?"   (Scale 1-10; mild, moderate or severe)     8 6. FEVER: "Do you have a fever?" If so, ask: "What is it, how was it measured, and when did it start?"      No 7. CAUSE: "What do you think is causing the leg swelling?"     Blood clot 8. MEDICAL HISTORY: "Do you have a history of heart failure, kidney disease, liver failure, or cancer?"     HTN 9. RECURRENT SYMPTOM: "Have you had leg swelling before?" If so, ask: "When was the last time?" "What happened that time?"     No 10. OTHER SYMPTOMS: "Do you have any other symptoms?" (e.g., chest pain,  difficulty breathing)       No 11. PREGNANCY: "Is there any chance you are pregnant?" "When was your last menstrual period?"       No  Protocols used: LEG SWELLING AND EDEMA-A-AH

## 2018-01-19 ENCOUNTER — Telehealth: Payer: Self-pay | Admitting: Oncology

## 2018-01-19 NOTE — Telephone Encounter (Signed)
Patient called to schedule missed yearly appointment

## 2018-01-21 DIAGNOSIS — I130 Hypertensive heart and chronic kidney disease with heart failure and stage 1 through stage 4 chronic kidney disease, or unspecified chronic kidney disease: Secondary | ICD-10-CM | POA: Diagnosis not present

## 2018-01-21 DIAGNOSIS — J45909 Unspecified asthma, uncomplicated: Secondary | ICD-10-CM | POA: Diagnosis not present

## 2018-01-21 DIAGNOSIS — K219 Gastro-esophageal reflux disease without esophagitis: Secondary | ICD-10-CM | POA: Diagnosis not present

## 2018-01-21 DIAGNOSIS — Z853 Personal history of malignant neoplasm of breast: Secondary | ICD-10-CM | POA: Diagnosis not present

## 2018-01-21 DIAGNOSIS — Z4789 Encounter for other orthopedic aftercare: Secondary | ICD-10-CM | POA: Diagnosis not present

## 2018-01-21 DIAGNOSIS — H9190 Unspecified hearing loss, unspecified ear: Secondary | ICD-10-CM | POA: Diagnosis not present

## 2018-01-21 DIAGNOSIS — N189 Chronic kidney disease, unspecified: Secondary | ICD-10-CM | POA: Diagnosis not present

## 2018-01-21 DIAGNOSIS — I5030 Unspecified diastolic (congestive) heart failure: Secondary | ICD-10-CM | POA: Diagnosis not present

## 2018-01-21 DIAGNOSIS — D631 Anemia in chronic kidney disease: Secondary | ICD-10-CM | POA: Diagnosis not present

## 2018-01-28 ENCOUNTER — Encounter (HOSPITAL_COMMUNITY): Payer: Self-pay

## 2018-01-28 ENCOUNTER — Emergency Department (HOSPITAL_COMMUNITY)
Admission: EM | Admit: 2018-01-28 | Discharge: 2018-01-29 | Disposition: A | Discharge status: Home / Self Care | Payer: Medicare HMO | Attending: Emergency Medicine | Admitting: Emergency Medicine

## 2018-01-28 ENCOUNTER — Other Ambulatory Visit: Payer: Self-pay

## 2018-01-28 ENCOUNTER — Emergency Department (HOSPITAL_COMMUNITY): Payer: Medicare HMO

## 2018-01-28 DIAGNOSIS — Z79899 Other long term (current) drug therapy: Secondary | ICD-10-CM | POA: Insufficient documentation

## 2018-01-28 DIAGNOSIS — S79921A Unspecified injury of right thigh, initial encounter: Secondary | ICD-10-CM | POA: Diagnosis not present

## 2018-01-28 DIAGNOSIS — Z7901 Long term (current) use of anticoagulants: Secondary | ICD-10-CM | POA: Insufficient documentation

## 2018-01-28 DIAGNOSIS — W01198A Fall on same level from slipping, tripping and stumbling with subsequent striking against other object, initial encounter: Secondary | ICD-10-CM | POA: Insufficient documentation

## 2018-01-28 DIAGNOSIS — M25552 Pain in left hip: Secondary | ICD-10-CM | POA: Diagnosis not present

## 2018-01-28 DIAGNOSIS — S79912A Unspecified injury of left hip, initial encounter: Secondary | ICD-10-CM | POA: Diagnosis not present

## 2018-01-28 DIAGNOSIS — Y92018 Other place in single-family (private) house as the place of occurrence of the external cause: Secondary | ICD-10-CM | POA: Insufficient documentation

## 2018-01-28 DIAGNOSIS — Y999 Unspecified external cause status: Secondary | ICD-10-CM | POA: Insufficient documentation

## 2018-01-28 DIAGNOSIS — R1031 Right lower quadrant pain: Secondary | ICD-10-CM | POA: Diagnosis not present

## 2018-01-28 DIAGNOSIS — R109 Unspecified abdominal pain: Secondary | ICD-10-CM | POA: Diagnosis not present

## 2018-01-28 DIAGNOSIS — W19XXXA Unspecified fall, initial encounter: Secondary | ICD-10-CM

## 2018-01-28 DIAGNOSIS — Z7982 Long term (current) use of aspirin: Secondary | ICD-10-CM | POA: Diagnosis not present

## 2018-01-28 DIAGNOSIS — R42 Dizziness and giddiness: Secondary | ICD-10-CM | POA: Diagnosis not present

## 2018-01-28 DIAGNOSIS — Y9389 Activity, other specified: Secondary | ICD-10-CM | POA: Diagnosis not present

## 2018-01-28 DIAGNOSIS — M79604 Pain in right leg: Secondary | ICD-10-CM | POA: Diagnosis not present

## 2018-01-28 DIAGNOSIS — N189 Chronic kidney disease, unspecified: Secondary | ICD-10-CM | POA: Insufficient documentation

## 2018-01-28 DIAGNOSIS — S0990XA Unspecified injury of head, initial encounter: Secondary | ICD-10-CM | POA: Diagnosis present

## 2018-01-28 DIAGNOSIS — R404 Transient alteration of awareness: Secondary | ICD-10-CM | POA: Diagnosis not present

## 2018-01-28 DIAGNOSIS — S199XXA Unspecified injury of neck, initial encounter: Secondary | ICD-10-CM | POA: Diagnosis not present

## 2018-01-28 DIAGNOSIS — I129 Hypertensive chronic kidney disease with stage 1 through stage 4 chronic kidney disease, or unspecified chronic kidney disease: Secondary | ICD-10-CM | POA: Insufficient documentation

## 2018-01-28 DIAGNOSIS — M79651 Pain in right thigh: Secondary | ICD-10-CM | POA: Diagnosis not present

## 2018-01-28 DIAGNOSIS — R1030 Lower abdominal pain, unspecified: Secondary | ICD-10-CM | POA: Diagnosis not present

## 2018-01-28 DIAGNOSIS — M25561 Pain in right knee: Secondary | ICD-10-CM | POA: Diagnosis not present

## 2018-01-28 DIAGNOSIS — M542 Cervicalgia: Secondary | ICD-10-CM | POA: Diagnosis not present

## 2018-01-28 DIAGNOSIS — R51 Headache: Secondary | ICD-10-CM | POA: Diagnosis not present

## 2018-01-28 DIAGNOSIS — Y92009 Unspecified place in unspecified non-institutional (private) residence as the place of occurrence of the external cause: Secondary | ICD-10-CM

## 2018-01-28 LAB — URINALYSIS, ROUTINE W REFLEX MICROSCOPIC
Bacteria, UA: NONE SEEN
Bilirubin Urine: NEGATIVE
Glucose, UA: NEGATIVE mg/dL
Ketones, ur: NEGATIVE mg/dL
Leukocytes, UA: NEGATIVE
Nitrite: NEGATIVE
Protein, ur: NEGATIVE mg/dL
RBC / HPF: NONE SEEN RBC/hpf (ref 0–5)
Specific Gravity, Urine: 1.005 (ref 1.005–1.030)
pH: 9 — ABNORMAL HIGH (ref 5.0–8.0)

## 2018-01-28 LAB — BASIC METABOLIC PANEL
Anion gap: 13 (ref 5–15)
BUN: 24 mg/dL — ABNORMAL HIGH (ref 6–20)
CO2: 26 mmol/L (ref 22–32)
Calcium: 9.5 mg/dL (ref 8.9–10.3)
Chloride: 92 mmol/L — ABNORMAL LOW (ref 101–111)
Creatinine, Ser: 1.34 mg/dL — ABNORMAL HIGH (ref 0.44–1.00)
GFR calc Af Amer: 42 mL/min — ABNORMAL LOW (ref >60)
GFR calc non Af Amer: 36 mL/min — ABNORMAL LOW (ref >60)
Glucose, Bld: 199 mg/dL — ABNORMAL HIGH (ref 65–99)
Potassium: 3.4 mmol/L — ABNORMAL LOW (ref 3.5–5.1)
Sodium: 131 mmol/L — ABNORMAL LOW (ref 135–145)

## 2018-01-28 LAB — CBC
HCT: 28.8 % — ABNORMAL LOW (ref 36.0–46.0)
Hemoglobin: 9.4 g/dL — ABNORMAL LOW (ref 12.0–15.0)
MCH: 31.1 pg (ref 26.0–34.0)
MCHC: 32.6 g/dL (ref 30.0–36.0)
MCV: 95.4 fL (ref 78.0–100.0)
Platelets: 306 10*3/uL (ref 150–400)
RBC: 3.02 MIL/uL — ABNORMAL LOW (ref 3.87–5.11)
RDW: 13.5 % (ref 11.5–15.5)
WBC: 7.8 10*3/uL (ref 4.0–10.5)

## 2018-01-28 LAB — CK: Total CK: 328 U/L — ABNORMAL HIGH (ref 38–234)

## 2018-01-28 MED ORDER — ACETAMINOPHEN 325 MG PO TABS
650.0000 mg | ORAL_TABLET | Freq: Once | ORAL | Status: AC
Start: 2018-01-28 — End: 2018-01-28
  Administered 2018-01-28: 650 mg via ORAL
  Filled 2018-01-28: qty 2

## 2018-01-28 MED ORDER — SODIUM CHLORIDE 0.9 % IV BOLUS
1000.0000 mL | Freq: Once | INTRAVENOUS | Status: AC
  Administered 2018-01-28: 1000 mL via INTRAVENOUS

## 2018-01-28 NOTE — ED Triage Notes (Addendum)
Per [facility], pt from home with complaint right leg pain from the right pelvic area to the right knee after having a fall this morning. Pt takes zanaflex for chronic back pain "which makes me tired and dizziness and I usually lay down and sleep it off" Fall occurred after pt took the medication. No LOC but did have dizziness prior to falling, denies neck or back pain. VSS, Axox4.

## 2018-01-28 NOTE — ED Provider Notes (Addendum)
[facility] EMERGENCY DEPARTMENT Provider Note   CSN: [id number] Arrival date & time: 01/28/18  1727     History   Chief Complaint Chief Complaint  Patient presents with  . Leg Pain  . Fall    HPI [name] is a 82 y.o. female.  HPI   82 year old female with hx of obesity, DDD, RA brought here via EMS from home for evaluation of a recent fall patient report she recently had back surgery and was placed on several medications that cause drowsiness.  While the medication list and Zanaflex.  When she takes it she report feeling tired and dizzy.  Today, after taking the medication, patient reportedly felt dizzy and fell.  She did strike her head against a soft arm but denies any loss of consciousness.  While on the floor, patient was having difficulty standing up due to prior knee surgery and she was afraid of having complication with knee as she stood up.  Patient attempted to scoot around the house trying to find a way to stand up.  She was on the ground for approximately 5 hours before she decided to call her daughter who notified EMS.  Patient only complaint is pain in her right groin area.  Report pain as a sharp nonradiating pain 8 out of 10 worse only with movement and minimal at rest.  She denies room spinning sensation with her dizziness.  No chest pain or trouble breathing prior to the fall.  No complaints of urinary symptoms.  She is currently on blood thinner medication, Eliquis, due to recent diagnosis of leg DVT.  Patient is normally independent but now lives with her daughter.  She has been using a walker.  No urinary symptoms.    Past Medical History:  Diagnosis Date  . Allergic rhinitis 10/30/2016  . Anemia   . Asthma   . Breast cancer of upper-outer quadrant of left female breast ([place]) 11/08/2013   ER/PR+ Her2- Left IDC   . Chronic renal insufficiency   . Chronic rhinitis   . Colon polyp   . Diastolic dysfunction 0/34/9179  . DJD  (degenerative joint disease)   . Dyspnea   . Full dentures   . GERD (gastroesophageal reflux disease)   . Hearing loss   . Hypertension   . Hyponatremia   . Impaired glucose tolerance 07/18/2014  . Memory loss   . Morbid obesity ([place])   . Poor circulation   . Vertigo   . Wears glasses     Patient Active Problem List   Diagnosis Date Noted  . Left leg pain 01/13/2018  . Left leg swelling 01/13/2018  . Hearing loss 01/13/2018  . Intractable pain 12/27/2017  . Arachnoiditis 12/27/2017  . Hyponatremia   . S/P lumbar laminectomy 12/23/2017  . Wheezing 12/10/2017  . Bilateral knee pain 12/10/2017  . Itching 11/22/2017  . Urinary frequency 11/22/2017  . Degenerative disc disease, lumbar 11/05/2017  . Left sided sciatica 09/04/2017  . Asthma exacerbation 08/31/2017  . Rheumatoid arthritis flare ([place]) 05/11/2017  . Acute gouty arthritis 05/01/2017  . Vertigo 10/30/2016  . Allergic rhinitis 10/30/2016  . Bowel incontinence 07/31/2016  . Hemorrhoids 07/31/2016  . Hematochezia 07/31/2016  . Chronic venous insufficiency 07/10/2016  . Diastolic dysfunction 15/02/6978  . Peripheral edema 07/10/2016  . Left ankle pain 06/02/2016  . Neck pain 03/10/2016  . Low back pain 03/10/2016  . Right cervical radiculopathy 11/13/2015  . Bradycardia 03/15/2015  . Arthralgia 12/18/2014  .  Fever 11/27/2014  . Rheumatoid arthritis ([place]) 10/23/2014  . Impaired glucose tolerance 07/18/2014  . Right lumbar radiculopathy 07/18/2014  . Encounter for well adult exam with abnormal findings 02/20/2014  . Breast cancer of upper-outer quadrant of left female breast ([place]) 11/08/2013  . Diverticulosis 07/09/2012  . Headache(784.0) 06/30/2012  . Anemia 06/29/2012  . Dyspnea 03/11/2012  . Cough 03/11/2012  . Chronic renal failure 05/18/2011  . GASTROESOPHAGEAL REFLUX DISEASE 07/05/2008  . VITAMIN D DEFICIENCY 12/19/2007  . Morbid obesity ([place]) 12/16/2007  . Essential hypertension 12/16/2007  .  DEGENERATIVE JOINT DISEASE 12/16/2007    Past Surgical History:  Procedure Laterality Date  . ABDOMINAL HYSTERECTOMY    . BREAST LUMPECTOMY WITH NEEDLE LOCALIZATION AND AXILLARY SENTINEL LYMPH NODE BX Left 12/04/2013   Procedure: BREAST LUMPECTOMY WITH NEEDLE LOCALIZATION AND AXILLARY SENTINEL LYMPH NODE BX;  Surgeon: Shann Medal, MD;  Location: [facility];  Service: General;  Laterality: Left;  . CATARACT EXTRACTION  2009   rt  . COLONOSCOPY    . EYE SURGERY Bilateral    cataract surgery  . KNEE ARTHROSCOPY     both  . LUMBAR LAMINECTOMY/DECOMPRESSION MICRODISCECTOMY Left 12/23/2017   Procedure: Left Lumbar One-Two Laminectomy with microdiscectomy;  Surgeon: Eustace Moore, MD;  Location: [facility];  Service: Neurosurgery;  Laterality: Left;  Left L1-2 Laminectomy with microdiscectomy  . TONSILLECTOMY    . TOTAL KNEE ARTHROPLASTY  2002   rt  . TOTAL KNEE ARTHROPLASTY  2003   left  . VESICOVAGINAL FISTULA CLOSURE W/ TAH  1980     OB History   None      Home Medications    Prior to Admission medications   Medication Sig Start Date End Date Taking? Authorizing Provider  albuterol (PROVENTIL HFA;VENTOLIN HFA) 108 (90 Base) MCG/ACT inhaler Inhale 2 puffs every 6 (six) hours as needed into the lungs for wheezing or shortness of breath. 08/31/17   Biagio Borg, MD  amLODipine (NORVASC) 5 MG tablet Take 1 tablet (5 mg total) by mouth daily. 06/30/17   Biagio Borg, MD  apixaban (ELIQUIS) 5 MG TABS tablet Take 1 tablet (5 mg total) by mouth 2 (two) times daily. 01/13/18   Biagio Borg, MD  aspirin 81 MG tablet Take 81 mg by mouth daily.      [provider]  budesonide-formoterol (SYMBICORT) 160-4.5 MCG/ACT inhaler Inhale 2 puffs 2 (two) times daily into the lungs. 08/31/17   Biagio Borg, MD  dexamethasone (DECADRON) 4 MG tablet Take 1 tablet (4 mg total) by mouth every 8 (eight) hours. 12/29/17   Kayleen Memos, DO  furosemide (LASIX) 20 MG tablet Take 1 tablet (20  mg total) by mouth daily as needed. Patient taking differently: Take 20 mg by mouth daily.  11/22/17   Biagio Borg, MD  gabapentin (NEURONTIN) 100 MG capsule Take 1 capsule (100 mg total) by mouth 3 (three) times daily. 11/29/17   Biagio Borg, MD  losartan-hydrochlorothiazide (HYZAAR) 100-25 MG tablet Take 1 tablet by mouth daily. 11/22/17   Biagio Borg, MD  meclizine (ANTIVERT) 12.5 MG tablet TAKE 1 TABLET THREE TIMES DAILY AS NEEDED  FOR  DIZZINESS Patient taking differently: TAKE 12.5 MG THREE TIMES DAILY AS NEEDED  FOR  DIZZINESS 09/06/17   Biagio Borg, MD  methocarbamol (ROBAXIN) 500 MG tablet Take 1 tablet (500 mg total) by mouth every 8 (eight) hours as needed for muscle spasms. 01/18/18   Davonna Belling, MD  Multiple Vitamins-Minerals (CENTRUM SILVER PO) Take 1 tablet by mouth daily.      [provider]  pantoprazole (PROTONIX) 40 MG tablet Take 1 tablet (40 mg total) by mouth daily. 01/12/18   Biagio Borg, MD  Potassium Gluconate 550 (90 K) MG TABS Take 550 mg by mouth daily.  10/31/15   Magrinat, [name], MD  Simethicone (GAS-X PO) Take 1 tablet by mouth daily as needed (for gas).     [provider]  solifenacin (VESICARE) 5 MG tablet Take 1 tablet (5 mg total) by mouth daily. 11/22/17   Biagio Borg, MD  tamoxifen (NOLVADEX) 20 MG tablet Take 1 tablet (20 mg total) by mouth daily. 08/09/17   Magrinat, [name], MD  traMADol (ULTRAM) 50 MG tablet Take 1 tablet (50 mg total) by mouth every 6 (six) hours as needed. Patient taking differently: Take 50 mg by mouth every 6 (six) hours as needed for moderate pain.  12/10/17   Biagio Borg, MD  triamcinolone (NASACORT AQ) 55 MCG/ACT AERO nasal inhaler Place 2 sprays into the nose daily. 10/30/16   Biagio Borg, MD  triamcinolone cream (KENALOG) 0.1 % Apply 1 application topically 2 (two) times daily. 11/22/17 11/22/18  Biagio Borg, MD  Vitamin D, Ergocalciferol, (DRISDOL) 50000 units CAPS capsule Take 1 capsule (50,000  Units total) by mouth every 7 (seven) days. Patient taking differently: Take 50,000 Units by mouth every 7 (seven) days. On Friday 11/05/17   Lyndal Pulley, DO    Family History Family History  Problem Relation Age of Onset  . Colon cancer Mother        in her 34's  . Stomach cancer Mother   . Heart disease Unknown        remote family  . Lung cancer Brother        was a smoker  . Heart attack Son 72    Social History Social History   Tobacco Use  . Smoking status: Never Smoker  . Smokeless tobacco: Never Used  Substance Use Topics  . Alcohol use: No    Alcohol/week: 0.0 oz  . Drug use: No     Allergies   Hydrocodone and Iron   Review of Systems Review of Systems  All other systems reviewed and are negative.    Physical Exam Updated Vital Signs BP 138/69   Pulse 91   Temp 99.2 F (37.3 C) (Oral)   Resp (!) 21   Ht _0  (1.6 m)   Wt 70.3 kg (155 lb)   SpO2 98%   BMI 27.46 kg/m   Physical Exam  Constitutional: She is oriented to person, place, and time. She appears well-developed and well-nourished. No distress.  Obese female laying in bed in no acute discomfort.  HENT:  Head: Atraumatic.  No evidence of head trauma, no scalp tenderness.   Eyes: Pupils are equal, round, and reactive to light. Conjunctivae and EOM are normal.  Neck: Normal range of motion. Neck supple.  Cardiovascular: Normal rate and regular rhythm.  Pulmonary/Chest: Effort normal and breath sounds normal.  Abdominal: Soft. Bowel sounds are normal.  Musculoskeletal: She exhibits edema (pitting edema to BLE with tenderness to left calf) and tenderness (R hip: mild tenderness to R groin with normal hip ROM.  no deformity noted. ).  No significant midline spine tenderness.   Neurological: She is alert and oriented to person, place, and time.  Skin: No rash noted.  Psychiatric: She has a  normal mood and affect.  Nursing note and vitals reviewed.    ED Treatments / Results   Labs (all labs ordered are listed, but only abnormal results are displayed) Labs Reviewed  BASIC METABOLIC PANEL - Abnormal; Notable for the following components:      Result Value   Sodium 131 (*)    Potassium 3.4 (*)    Chloride 92 (*)    Glucose, Bld 199 (*)    BUN 24 (*)    Creatinine, Ser 1.34 (*)    GFR calc non Af Amer 36 (*)    GFR calc Af Amer 42 (*)    All other components within normal limits  CBC - Abnormal; Notable for the following components:   RBC 3.02 (*)    Hemoglobin 9.4 (*)    HCT 28.8 (*)    All other components within normal limits    EKG EKG Interpretation  Date/Time:  Friday January 28 2018 17:49:01 EDT Ventricular Rate:  90 PR Interval:  174 QRS Duration: 86 QT Interval:  364 QTC Calculation: 445 R Axis:   -34 Text Interpretation:  Sinus rhythm with Premature atrial complexes Left axis deviation Cannot rule out Anterior infarct , age undetermined Abnormal ECG Confirmed by Ripley Fraise [phone]) on 02/03/2018 11:04:07 AM   Radiology Dg Knee Complete 4 Views Right  Result Date: 01/28/2018 CLINICAL DATA:  RIGHT knee pain after fall this morning. EXAM: RIGHT KNEE - COMPLETE 4+ VIEW COMPARISON:  RIGHT knee radiograph September 29, 2005 FINDINGS: No fracture deformity or dislocation. Status post total knee arthroplasty with intact well-seated non cemented hardware without periprosthetic lucency. Resurfaced patella. No destructive bony lesions. Mild vascular calcifications. No subcutaneous gas or radiopaque foreign bodies. IMPRESSION: 1. No fracture deformity or dislocation. 2. Status post total knee arthroplasty without radiographic findings of hardware failure. Electronically Signed   By: Elon Alas M.D.   On: 01/28/2018 18:33   Dg Hip Unilat With Pelvis 2-3 Views Left  Result Date: 01/28/2018 CLINICAL DATA:  Per [facility], pt from home with complaints of right leg pain from the right pelvic area to the right knee after having a fall this morning. Pt takes  zanaflex for chronic back pain "which makes me tired and dizziness and I usually lay down and sleep it off." Fall occurred after pt took the medication. No LOC but did have dizziness prior to falling. Patient also reports a "bump" on anterior aspect of right knee, states she does not remember if it was there before the fall. Denies right knee pain. Patient denying left hip pain at this time, but states "they have been watching my legs closely due to blood clots recently." EXAM: DG HIP (WITH OR WITHOUT PELVIS) 2-3V LEFT COMPARISON:  None. FINDINGS: No fracture.  No bone lesion. The hip joint is normally spaced and aligned. No significant arthropathic changes. There is sclerosis along the iliac side of the left SI joint. There is mild widening of the symphysis pubis. Right hip joint and SI joints are normally spaced and aligned. Soft tissues are unremarkable. IMPRESSION: No fracture or dislocation. Electronically Signed   By: Lajean Manes M.D.   On: 01/28/2018 18:34   Dg Femur, Min 2 Views Right  Result Date: 01/28/2018 CLINICAL DATA:  Per [facility], pt from home with complaints of right leg pain from the right pelvic area to the right knee after having a fall this morning. Pt takes zanaflex for chronic back pain "which makes me tired and dizziness and I usually  lay down and sleep it off." Fall occurred after pt took the medication. No LOC but did have dizziness prior to falling. Patient also reports a "bump" on anterior aspect of right knee, states she does not remember if it was there before the fall. Denies right knee pain. Patient denying left hip pain at this time, but states "they have been watching my legs closely due to blood clots recently." EXAM: RIGHT FEMUR 2 VIEWS COMPARISON:  None. FINDINGS: No fracture.  No bone lesion. Knee prosthetic components appear well seated and well aligned. Hip joint is normally spaced and aligned. Soft tissues are unremarkable. IMPRESSION: No fracture or dislocation.  Electronically Signed   By: Lajean Manes M.D.   On: 01/28/2018 18:35    Procedures Procedures (including critical care time)  Medications Ordered in ED Medications  acetaminophen (TYLENOL) tablet 650 mg (has no administration in time range)     Initial Impression / Assessment and Plan / ED Course  I have reviewed the triage vital signs and the nursing notes.  Pertinent labs & imaging results that were available during my care of the patient were reviewed by me and considered in my medical decision making (see chart for details).     BP 138/69   Pulse 91   Temp 99.2 F (37.3 C) (Oral)   Resp (!) 21   Ht _0  (1.6 m)   Wt 70.3 kg (155 lb)   SpO2 98%   BMI 27.46 kg/m    Final Clinical Impressions(s) / ED Diagnoses   Final diagnoses:  Fall    ED Discharge Orders    None     10:01 PM Patient who is currently on blood thinner medication fell earlier today after she took her Zanaflex.  This medication has caused her to be drowsy in the past.  This is likely due to medication side effect.  She however was on the ground for approximately 5 hours trying to find a way to stand up.  Plan to obtain CK.  She did hit her head so therefore, will obtain head and cervical spine CT due to being on blood thinner medication.  She would need a urine to ensure no evidence of UTI causing instability.  Fortunately her right hip and pelvis, right femur, and right knee x-ray are without acute fractures or dislocation.  Care discussed with Dr. Regenia Skeeter.  Patient signed out to oncoming provider who will follow up on urine, CK, along with head and cervical spine CT.  Patient will need to be ambulated prior to discharge.  Tylenol given for pain.  10:08 PM Pt sign out to Rachael Fee, PA-C   Domenic Moras, PA-C 01/28/18 2208    Sherwood Gambler, MD 01/29/18 2257  ADDENDUM: ECG result added 02/09/2018   Domenic Moras, PA-C 02/09/18 2157    Sherwood Gambler, MD 02/09/18 [phone]

## 2018-01-28 NOTE — ED Notes (Signed)
Pt returned to room  

## 2018-01-28 NOTE — ED Notes (Signed)
EKG did not cross over in MUSE. Hardcopy EKG available in triage

## 2018-01-28 NOTE — ED Provider Notes (Signed)
Assumed care from PA Tran at shift change.  See prior notes for full H&P.  Briefly 82 year old female presenting to the ED with leg/groin pain after a fall.  She gets drowsy when taking Zanaflex and took this earlier today.  She laid in the floor for about 5 hours prior to calling her daughter for help.  Workup thus far has been overall reassuring.  Plan:  Awaiting CT head/neck, UA, and CK.  dispo pending results.  Results for orders placed or performed during the hospital encounter of 10/62/69  Basic metabolic panel  Result Value Ref Range   Sodium 131 (L) 135 - 145 mmol/L   Potassium 3.4 (L) 3.5 - 5.1 mmol/L   Chloride 92 (L) 101 - 111 mmol/L   CO2 26 22 - 32 mmol/L   Glucose, Bld 199 (H) 65 - 99 mg/dL   BUN 24 (H) 6 - 20 mg/dL   Creatinine, Ser 1.34 (H) 0.44 - 1.00 mg/dL   Calcium 9.5 8.9 - 10.3 mg/dL   GFR calc non Af Amer 36 (L) >60 mL/min   GFR calc Af Amer 42 (L) >60 mL/min   Anion gap 13 5 - 15  CBC  Result Value Ref Range   WBC 7.8 4.0 - 10.5 K/uL   RBC 3.02 (L) 3.87 - 5.11 MIL/uL   Hemoglobin 9.4 (L) 12.0 - 15.0 g/dL   HCT 28.8 (L) 36.0 - 46.0 %   MCV 95.4 78.0 - 100.0 fL   MCH 31.1 26.0 - 34.0 pg   MCHC 32.6 30.0 - 36.0 g/dL   RDW 13.5 11.5 - 15.5 %   Platelets 306 150 - 400 K/uL  Urinalysis, Routine w reflex microscopic  Result Value Ref Range   Color, Urine STRAW (A) YELLOW   APPearance CLEAR CLEAR   Specific Gravity, Urine 1.005 1.005 - 1.030   pH 9.0 (H) 5.0 - 8.0   Glucose, UA NEGATIVE NEGATIVE mg/dL   Hgb urine dipstick MODERATE (A) NEGATIVE   Bilirubin Urine NEGATIVE NEGATIVE   Ketones, ur NEGATIVE NEGATIVE mg/dL   Protein, ur NEGATIVE NEGATIVE mg/dL   Nitrite NEGATIVE NEGATIVE   Leukocytes, UA NEGATIVE NEGATIVE   RBC / HPF NONE SEEN 0 - 5 RBC/hpf   WBC, UA 0-5 0 - 5 WBC/hpf   Bacteria, UA NONE SEEN NONE SEEN   Squamous Epithelial / LPF 0-5 (A) NONE SEEN  CK  Result Value Ref Range   Total CK 328 (H) 38 - 234 U/L   Ct Head Wo Contrast  Result  Date: 01/28/2018 CLINICAL DATA:  Fall.  Dizziness.  Denies headache or neck pain. EXAM: CT HEAD WITHOUT CONTRAST CT CERVICAL SPINE WITHOUT CONTRAST TECHNIQUE: Multidetector CT imaging of the head and cervical spine was performed following the standard protocol without intravenous contrast. Multiplanar CT image reconstructions of the cervical spine were also generated. COMPARISON:  Cervical spine x-rays dated Mar 10, 2016. FINDINGS: CT HEAD FINDINGS Brain: No evidence of acute infarction, hemorrhage, hydrocephalus, extra-axial collection or mass lesion/mass effect. Mild age related cerebral atrophy. Vascular: Atherosclerotic vascular calcification of the carotid siphons. No hyperdense vessel. Skull: Normal. Negative for fracture or focal lesion. Sinuses/Orbits: No acute finding. Other: None. CT CERVICAL SPINE FINDINGS Alignment: Unchanged reversal of the normal cervical lordosis with 3 mm anterolisthesis at C3-C4. No traumatic malalignment. Skull base and vertebrae: No acute fracture. No primary bone lesion or focal pathologic process. Soft tissues and spinal canal: No prevertebral fluid or swelling. No visible canal hematoma. Disc levels: Severe degenerative disc  disease and uncovertebral hypertrophy from C4-C5 through C6-C7, progressed when compared to prior study. Moderate degenerative disc disease at the remaining cervical levels. Mild to moderate left-sided facet arthropathy throughout the cervical spine. Upper chest: Negative. Other: None. IMPRESSION: 1. Normal for age noncontrast head CT. 2. No acute cervical spine fracture. Multilevel severe degenerative changes of the cervical spine, progressed when compared to prior study. Electronically Signed   By: Titus Dubin M.D.   On: 01/28/2018 23:23   Ct Cervical Spine Wo Contrast  Result Date: 01/28/2018 CLINICAL DATA:  Fall.  Dizziness.  Denies headache or neck pain. EXAM: CT HEAD WITHOUT CONTRAST CT CERVICAL SPINE WITHOUT CONTRAST TECHNIQUE: Multidetector CT  imaging of the head and cervical spine was performed following the standard protocol without intravenous contrast. Multiplanar CT image reconstructions of the cervical spine were also generated. COMPARISON:  Cervical spine x-rays dated Mar 10, 2016. FINDINGS: CT HEAD FINDINGS Brain: No evidence of acute infarction, hemorrhage, hydrocephalus, extra-axial collection or mass lesion/mass effect. Mild age related cerebral atrophy. Vascular: Atherosclerotic vascular calcification of the carotid siphons. No hyperdense vessel. Skull: Normal. Negative for fracture or focal lesion. Sinuses/Orbits: No acute finding. Other: None. CT CERVICAL SPINE FINDINGS Alignment: Unchanged reversal of the normal cervical lordosis with 3 mm anterolisthesis at C3-C4. No traumatic malalignment. Skull base and vertebrae: No acute fracture. No primary bone lesion or focal pathologic process. Soft tissues and spinal canal: No prevertebral fluid or swelling. No visible canal hematoma. Disc levels: Severe degenerative disc disease and uncovertebral hypertrophy from C4-C5 through C6-C7, progressed when compared to prior study. Moderate degenerative disc disease at the remaining cervical levels. Mild to moderate left-sided facet arthropathy throughout the cervical spine. Upper chest: Negative. Other: None. IMPRESSION: 1. Normal for age noncontrast head CT. 2. No acute cervical spine fracture. Multilevel severe degenerative changes of the cervical spine, progressed when compared to prior study. Electronically Signed   By: Titus Dubin M.D.   On: 01/28/2018 23:23   Dg Knee Complete 4 Views Right  Result Date: 01/28/2018 CLINICAL DATA:  RIGHT knee pain after fall this morning. EXAM: RIGHT KNEE - COMPLETE 4+ VIEW COMPARISON:  RIGHT knee radiograph September 29, 2005 FINDINGS: No fracture deformity or dislocation. Status post total knee arthroplasty with intact well-seated non cemented hardware without periprosthetic lucency. Resurfaced patella. No  destructive bony lesions. Mild vascular calcifications. No subcutaneous gas or radiopaque foreign bodies. IMPRESSION: 1. No fracture deformity or dislocation. 2. Status post total knee arthroplasty without radiographic findings of hardware failure. Electronically Signed   By: Elon Alas M.D.   On: 01/28/2018 18:33   Dg Hip Unilat With Pelvis 2-3 Views Left  Result Date: 01/28/2018 CLINICAL DATA:  Per [facility], pt from home with complaints of right leg pain from the right pelvic area to the right knee after having a fall this morning. Pt takes zanaflex for chronic back pain "which makes me tired and dizziness and I usually lay down and sleep it off." Fall occurred after pt took the medication. No LOC but did have dizziness prior to falling. Patient also reports a "bump" on anterior aspect of right knee, states she does not remember if it was there before the fall. Denies right knee pain. Patient denying left hip pain at this time, but states "they have been watching my legs closely due to blood clots recently." EXAM: DG HIP (WITH OR WITHOUT PELVIS) 2-3V LEFT COMPARISON:  None. FINDINGS: No fracture.  No bone lesion. The hip joint is normally spaced and aligned.  No significant arthropathic changes. There is sclerosis along the iliac side of the left SI joint. There is mild widening of the symphysis pubis. Right hip joint and SI joints are normally spaced and aligned. Soft tissues are unremarkable. IMPRESSION: No fracture or dislocation. Electronically Signed   By: Lajean Manes M.D.   On: 01/28/2018 18:34   Dg Femur, Min 2 Views Right  Result Date: 01/28/2018 CLINICAL DATA:  Per [facility], pt from home with complaints of right leg pain from the right pelvic area to the right knee after having a fall this morning. Pt takes zanaflex for chronic back pain "which makes me tired and dizziness and I usually lay down and sleep it off." Fall occurred after pt took the medication. No LOC but did have dizziness prior to  falling. Patient also reports a "bump" on anterior aspect of right knee, states she does not remember if it was there before the fall. Denies right knee pain. Patient denying left hip pain at this time, but states "they have been watching my legs closely due to blood clots recently." EXAM: RIGHT FEMUR 2 VIEWS COMPARISON:  None. FINDINGS: No fracture.  No bone lesion. Knee prosthetic components appear well seated and well aligned. Hip joint is normally spaced and aligned. Soft tissues are unremarkable. IMPRESSION: No fracture or dislocation. Electronically Signed   By: Lajean Manes M.D.   On: 01/28/2018 18:35    CK is elevated at 328.  Has hemoglobin in UA but no RBC's.  Suspect this is myoglobin from mild CK.  Will give IVFB.  CT head/cervical spine negative.  1:01 AM Patient was able to get up and ambulate with walker which is her baseline.  She states she feels stiff but no significant pain at this time.  VSS.  Feel she is stable for discharge home.  Discussed with daughter either cutting her dose of zanaflex in half or limiting to night time use to avoid drowsiness. Also discussed pushing oral fluids to help with mild rhabdo. Can follow-up with PCP.  They both understand to return here for any new/acute change.s   Larene Pickett, PA-C 01/29/18 0105    Sherwood Gambler, MD 01/29/18 2257

## 2018-01-28 NOTE — ED Notes (Signed)
Patient transported to CT 

## 2018-01-29 DIAGNOSIS — R1031 Right lower quadrant pain: Secondary | ICD-10-CM | POA: Diagnosis not present

## 2018-01-29 DIAGNOSIS — Z7982 Long term (current) use of aspirin: Secondary | ICD-10-CM | POA: Diagnosis not present

## 2018-01-29 DIAGNOSIS — W01198A Fall on same level from slipping, tripping and stumbling with subsequent striking against other object, initial encounter: Secondary | ICD-10-CM | POA: Diagnosis not present

## 2018-01-29 DIAGNOSIS — Z7901 Long term (current) use of anticoagulants: Secondary | ICD-10-CM | POA: Diagnosis not present

## 2018-01-29 DIAGNOSIS — N189 Chronic kidney disease, unspecified: Secondary | ICD-10-CM | POA: Diagnosis not present

## 2018-01-29 DIAGNOSIS — M79604 Pain in right leg: Secondary | ICD-10-CM | POA: Diagnosis not present

## 2018-01-29 DIAGNOSIS — Z79899 Other long term (current) drug therapy: Secondary | ICD-10-CM | POA: Diagnosis not present

## 2018-01-29 DIAGNOSIS — I129 Hypertensive chronic kidney disease with stage 1 through stage 4 chronic kidney disease, or unspecified chronic kidney disease: Secondary | ICD-10-CM | POA: Diagnosis not present

## 2018-01-29 NOTE — Discharge Instructions (Signed)
Can try cutting zanaflex tablet in half or limiting to night time use to avoid drowsiness. Push oral fluids to help with muscle soreness and breakdown. Follow-up with your primary care doctor. Return here for any new/acute changes.

## 2018-01-31 ENCOUNTER — Encounter: Payer: Self-pay | Admitting: Internal Medicine

## 2018-01-31 ENCOUNTER — Other Ambulatory Visit (INDEPENDENT_AMBULATORY_CARE_PROVIDER_SITE_OTHER): Payer: Medicare HMO

## 2018-01-31 ENCOUNTER — Telehealth: Payer: Self-pay | Admitting: Internal Medicine

## 2018-01-31 ENCOUNTER — Other Ambulatory Visit: Payer: Self-pay | Admitting: Internal Medicine

## 2018-01-31 ENCOUNTER — Ambulatory Visit (INDEPENDENT_AMBULATORY_CARE_PROVIDER_SITE_OTHER): Payer: Medicare HMO | Admitting: Internal Medicine

## 2018-01-31 VITALS — BP 134/78 | HR 78 | Temp 98.2°F | Ht 63.0 in | Wt 161.0 lb

## 2018-01-31 DIAGNOSIS — R748 Abnormal levels of other serum enzymes: Secondary | ICD-10-CM

## 2018-01-31 DIAGNOSIS — D649 Anemia, unspecified: Secondary | ICD-10-CM

## 2018-01-31 DIAGNOSIS — E876 Hypokalemia: Secondary | ICD-10-CM | POA: Diagnosis not present

## 2018-01-31 DIAGNOSIS — I10 Essential (primary) hypertension: Secondary | ICD-10-CM

## 2018-01-31 DIAGNOSIS — R609 Edema, unspecified: Secondary | ICD-10-CM | POA: Diagnosis not present

## 2018-01-31 DIAGNOSIS — E871 Hypo-osmolality and hyponatremia: Secondary | ICD-10-CM

## 2018-01-31 DIAGNOSIS — N183 Chronic kidney disease, stage 3 unspecified: Secondary | ICD-10-CM

## 2018-01-31 DIAGNOSIS — I82413 Acute embolism and thrombosis of femoral vein, bilateral: Secondary | ICD-10-CM

## 2018-01-31 DIAGNOSIS — R9431 Abnormal electrocardiogram [ECG] [EKG]: Secondary | ICD-10-CM

## 2018-01-31 DIAGNOSIS — R7302 Impaired glucose tolerance (oral): Secondary | ICD-10-CM

## 2018-01-31 DIAGNOSIS — D509 Iron deficiency anemia, unspecified: Secondary | ICD-10-CM

## 2018-01-31 LAB — CBC WITH DIFFERENTIAL/PLATELET
Basophils Absolute: 0.1 10*3/uL (ref 0.0–0.1)
Basophils Relative: 0.8 % (ref 0.0–3.0)
Eosinophils Absolute: 0.5 10*3/uL (ref 0.0–0.7)
Eosinophils Relative: 6.3 % — ABNORMAL HIGH (ref 0.0–5.0)
HCT: 25.8 % — ABNORMAL LOW (ref 36.0–46.0)
Hemoglobin: 8.8 g/dL — ABNORMAL LOW (ref 12.0–15.0)
Lymphocytes Relative: 18.9 % (ref 12.0–46.0)
Lymphs Abs: 1.4 10*3/uL (ref 0.7–4.0)
MCHC: 34 g/dL (ref 30.0–36.0)
MCV: 96 fl (ref 78.0–100.0)
Monocytes Absolute: 0.9 10*3/uL (ref 0.1–1.0)
Monocytes Relative: 11.8 % (ref 3.0–12.0)
Neutro Abs: 4.5 10*3/uL (ref 1.4–7.7)
Neutrophils Relative %: 62.2 % (ref 43.0–77.0)
Platelets: 360 10*3/uL (ref 150.0–400.0)
RBC: 2.69 Mil/uL — ABNORMAL LOW (ref 3.87–5.11)
RDW: 14.5 % (ref 11.5–15.5)
WBC: 7.2 10*3/uL (ref 4.0–10.5)

## 2018-01-31 LAB — CK: Total CK: 275 U/L — ABNORMAL HIGH (ref 7–177)

## 2018-01-31 LAB — BASIC METABOLIC PANEL
BUN: 23 mg/dL (ref 6–23)
CO2: 27 mEq/L (ref 19–32)
Calcium: 8.7 mg/dL (ref 8.4–10.5)
Chloride: 93 mEq/L — ABNORMAL LOW (ref 96–112)
Creatinine, Ser: 1.29 mg/dL — ABNORMAL HIGH (ref 0.40–1.20)
GFR: 51.11 mL/min — ABNORMAL LOW (ref >60.00)
Glucose, Bld: 105 mg/dL — ABNORMAL HIGH (ref 70–99)
Potassium: 3.5 mEq/L (ref 3.5–5.1)
Sodium: 130 mEq/L — ABNORMAL LOW (ref 135–145)

## 2018-01-31 LAB — IBC PANEL
Iron: 33 ug/dL — ABNORMAL LOW (ref 42–145)
Saturation Ratios: 10.6 % — ABNORMAL LOW (ref 20.0–50.0)
Transferrin: 222 mg/dL (ref 212.0–360.0)

## 2018-01-31 LAB — VITAMIN B12: Vitamin B-12: 973 pg/mL — ABNORMAL HIGH (ref 211–911)

## 2018-01-31 LAB — BRAIN NATRIURETIC PEPTIDE: Pro B Natriuretic peptide (BNP): 41 pg/mL (ref 0.0–100.0)

## 2018-01-31 MED ORDER — POLYSACCHARIDE IRON COMPLEX 150 MG PO CAPS: 150.0000 mg | ORAL_CAPSULE | Freq: Two times a day (BID) | ORAL | 5 refills | Status: DC

## 2018-01-31 MED ORDER — AMLODIPINE BESYLATE 5 MG PO TABS: 5.0000 mg | ORAL_TABLET | Freq: Every day | ORAL | 0 refills | Status: DC

## 2018-01-31 NOTE — Telephone Encounter (Signed)
Copied from [place]. Topic: Quick Communication - Rx Refill/Question >> Jan 31, 2018 11:57 AM Corie Chiquito, NT wrote: Medication: Amlodipine 5mg  Has the patient contacted their pharmacy? Yes Patient calling because she needs a refill on he above medication. Stated that she has been in contact with the pharmacy but was told she needed to call the doctors office first Preferred Pharmacy (with phone number or street name): [place] [phone] Agent: Please be advised that RX refills may take up to 3 business days. We ask that you follow-up with your pharmacy.

## 2018-01-31 NOTE — Assessment & Plan Note (Signed)
Mild, asympt, for a1c with labs 

## 2018-01-31 NOTE — Assessment & Plan Note (Signed)
To cont eliquis  Note:  Total time for pt hx, exam, review of record with pt in the room, determination of diagnoses and plan for further eval and tx is > 40 min, with over 50% spent in coordination and counseling of patient including the differential dx, tx, further evaluation and other management of acute bilat DVT, recent fall with elevated CPK, hyponatremia, hypokalemia, peripheral edema, abnormal ecg, hyperglycemia, HTN, CKD

## 2018-01-31 NOTE — Assessment & Plan Note (Signed)
?   Renal related anemia, d/w pt and daughter, consider renal referral

## 2018-01-31 NOTE — Assessment & Plan Note (Signed)
Mild, asympt, for f/u lab

## 2018-01-31 NOTE — Assessment & Plan Note (Addendum)
Likely post dvt related, cant r/o other, ecg reviewed, also for echo

## 2018-01-31 NOTE — Assessment & Plan Note (Signed)
Mild, etiology unclear, does not appear volume deplete for now, cont same tx including the hct, for f/u lab today

## 2018-01-31 NOTE — Assessment & Plan Note (Signed)
stable overall by history and exam, recent data reviewed with pt, and pt to continue medical treatment as before,  to f/u any worsening symptoms or concerns BP Readings from Last 3 Encounters:  01/31/18 134/78  01/29/18 (!) 141/83  01/18/18 122/71

## 2018-01-31 NOTE — Assessment & Plan Note (Signed)
Likely due to recent fall, for f/u lab today

## 2018-01-31 NOTE — Progress Notes (Signed)
Subjective:    Patient ID: [name], female    DOB: [birth year], 82 y.o.   MRN: [id number]  HPI    Here to f/u with daughter, was Seen at ED mar 26 with right leg pain as well as left pain and sweling, found to also have right post knee DVT as well as prior known left DVT, already on eliquis and continued.   Pt states has left > right swelling persistent, plans to restart compression stockings soon.  Pt denies chest pain, increased sob or doe, wheezing, orthopnea, PND, increased LE swelling, palpitations, dizziness or syncope.  Had fall apr 5 with f/u ED visit and multiple imaging neg for acute.  Now done with Vit D rx per Dr Smith/sports med and does not feel need to see again for now.  Did also have recent new mild low Na, low K, CKD3, mild elevated CPK and persistent anemia on ED labs.  Denies overt bleeding.  No prior hx of iron deficiency.  Has been on HCT for quite some time and no worsening sodium prior.  No new complaints Past Medical History:  Diagnosis Date  . Allergic rhinitis 10/30/2016  . Anemia   . Asthma   . Breast cancer of upper-outer quadrant of left female breast ([place]) 11/08/2013   ER/PR+ Her2- Left IDC   . Chronic renal insufficiency   . Chronic rhinitis   . Colon polyp   . Diastolic dysfunction 3/88/8280  . DJD (degenerative joint disease)   . Dyspnea   . Full dentures   . GERD (gastroesophageal reflux disease)   . Hearing loss   . Hypertension   . Hyponatremia   . Impaired glucose tolerance 07/18/2014  . Memory loss   . Morbid obesity ([place])   . Poor circulation   . Vertigo   . Wears glasses    Past Surgical History:  Procedure Laterality Date  . ABDOMINAL HYSTERECTOMY    . BREAST LUMPECTOMY WITH NEEDLE LOCALIZATION AND AXILLARY SENTINEL LYMPH NODE BX Left 12/04/2013   Procedure: BREAST LUMPECTOMY WITH NEEDLE LOCALIZATION AND AXILLARY SENTINEL LYMPH NODE BX;  Surgeon: Shann Medal, MD;  Location: [facility];  Service: General;   Laterality: Left;  . CATARACT EXTRACTION  2009   rt  . COLONOSCOPY    . EYE SURGERY Bilateral    cataract surgery  . KNEE ARTHROSCOPY     both  . LUMBAR LAMINECTOMY/DECOMPRESSION MICRODISCECTOMY Left 12/23/2017   Procedure: Left Lumbar One-Two Laminectomy with microdiscectomy;  Surgeon: Eustace Moore, MD;  Location: [facility];  Service: Neurosurgery;  Laterality: Left;  Left L1-2 Laminectomy with microdiscectomy  . TONSILLECTOMY    . TOTAL KNEE ARTHROPLASTY  2002   rt  . TOTAL KNEE ARTHROPLASTY  2003   left  . VESICOVAGINAL FISTULA CLOSURE W/ TAH  1980    reports that she has never smoked. She has never used smokeless tobacco. She reports that she does not drink alcohol or use drugs. family history includes Colon cancer in her mother; Heart attack (age of onset: 56) in her son; Heart disease in her unknown relative; Lung cancer in her brother; Stomach cancer in her mother. Allergies  Allergen Reactions  . Hydrocodone Itching  . Tizanidine Other (See Comments)    Dizzy and fall  . Iron Hives and Other (See Comments)    [place], bad constipation   Current Outpatient Medications on File Prior to Visit  Medication Sig Dispense Refill  . albuterol (PROVENTIL HFA;VENTOLIN HFA) 108 (  90 Base) MCG/ACT inhaler Inhale 2 puffs every 6 (six) hours as needed into the lungs for wheezing or shortness of breath. 1 Inhaler 11  . apixaban (ELIQUIS) 5 MG TABS tablet Take 1 tablet (5 mg total) by mouth 2 (two) times daily. 60 tablet 5  . aspirin 81 MG tablet Take 81 mg by mouth daily.      . budesonide-formoterol (SYMBICORT) 160-4.5 MCG/ACT inhaler Inhale 2 puffs 2 (two) times daily into the lungs. 1 Inhaler 11  . dexamethasone (DECADRON) 4 MG tablet Take 1 tablet (4 mg total) by mouth every 8 (eight) hours. 21 tablet 0  . furosemide (LASIX) 20 MG tablet Take 1 tablet (20 mg total) by mouth daily as needed. (Patient taking differently: Take 20 mg by mouth daily. ) 90 tablet 3  . gabapentin (NEURONTIN) 100  MG capsule Take 1 capsule (100 mg total) by mouth 3 (three) times daily. 90 capsule 5  . losartan-hydrochlorothiazide (HYZAAR) 100-25 MG tablet Take 1 tablet by mouth daily. 90 tablet 1  . meclizine (ANTIVERT) 12.5 MG tablet TAKE 1 TABLET THREE TIMES DAILY AS NEEDED  FOR  DIZZINESS (Patient taking differently: TAKE 12.5 MG THREE TIMES DAILY AS NEEDED  FOR  DIZZINESS) 270 tablet 0  . methocarbamol (ROBAXIN) 500 MG tablet Take 1 tablet (500 mg total) by mouth every 8 (eight) hours as needed for muscle spasms. 10 tablet 0  . Multiple Vitamins-Minerals (CENTRUM SILVER PO) Take 1 tablet by mouth daily.      . pantoprazole (PROTONIX) 40 MG tablet Take 1 tablet (40 mg total) by mouth daily. 90 tablet 1  . Potassium Gluconate 550 (90 K) MG TABS Take 550 mg by mouth daily.     . Simethicone (GAS-X PO) Take 1 tablet by mouth daily as needed (for gas).     . solifenacin (VESICARE) 5 MG tablet Take 1 tablet (5 mg total) by mouth daily. 90 tablet 3  . tamoxifen (NOLVADEX) 20 MG tablet Take 1 tablet (20 mg total) by mouth daily. 90 tablet 3  . traMADol (ULTRAM) 50 MG tablet Take 1 tablet (50 mg total) by mouth every 6 (six) hours as needed. (Patient taking differently: Take 50 mg by mouth every 6 (six) hours as needed for moderate pain. ) 60 tablet 1  . triamcinolone (NASACORT AQ) 55 MCG/ACT AERO nasal inhaler Place 2 sprays into the nose daily. 1 Inhaler 12  . triamcinolone cream (KENALOG) 0.1 % Apply 1 application topically 2 (two) times daily. 30 g 0  . Vitamin D, Ergocalciferol, (DRISDOL) 50000 units CAPS capsule Take 1 capsule (50,000 Units total) by mouth every 7 (seven) days. (Patient taking differently: Take 50,000 Units by mouth every 7 (seven) days. On Friday) 12 capsule 0   No current facility-administered medications on file prior to visit.    Review of Systems  Constitutional: Negative for other unusual diaphoresis or sweats HENT: Negative for ear discharge or swelling Eyes: Negative for other  worsening visual disturbances Respiratory: Negative for stridor or other swelling  Gastrointestinal: Negative for worsening distension or other blood Genitourinary: Negative for retention or other urinary change Musculoskeletal: Negative for other MSK pain or swelling Skin: Negative for color change or other new lesions Neurological: Negative for worsening tremors and other numbness  Psychiatric/Behavioral: Negative for worsening agitation or other fatigue All other system neg per pt    Objective:   Physical Exam BP 134/78   Pulse 78   Temp 98.2 F (36.8 C) (Oral)   Ht 5'  3" (1.6 m)   Wt 161 lb (73 kg)   SpO2 96%   BMI 28.52 kg/m  VS noted,  Constitutional: Pt appears in NAD HENT: Head: NCAT.  Right Ear: External ear normal.  Left Ear: External ear normal.  Eyes: . Pupils are equal, round, and reactive to light. Conjunctivae and EOM are normal Nose: without d/c or deformity Neck: Neck supple. Gross normal ROM Cardiovascular: Normal rate and regular rhythm.   Pulmonary/Chest: Effort normal and breath sounds without rales or wheezing.  Abd:  Soft, NT, ND, + BS, no organomegaly Neurological: Pt is alert. At baseline orientation, motor grossly intact Skin: Skin is warm. No rashes, other new lesions, trace to 1+ bilat LE edema left > right Psychiatric: Pt behavior is normal without agitation  No other exam findings   2017 - iron and B12 normal  4/6 ECG  - SR with PAC and Non specific changes    Assessment & Plan:

## 2018-01-31 NOTE — Assessment & Plan Note (Signed)
For f/u iron and b12 levels today, on new eliquis though no overt bleeding

## 2018-01-31 NOTE — Patient Instructions (Signed)
Please continue all other medications as before, and refills have been done if requested.  Please have the pharmacy call with any other refills you may need.  Please continue your efforts at being more active, low cholesterol diet, and weight control.  You are otherwise up to date with prevention measures today.  Please keep your appointments with your specialists as you may have planned  You will be contacted regarding the referral for: Echocardiogram  Please go to the LAB in the Basement (turn left off the elevator) for the tests to be done today  You will be contacted by phone if any changes need to be made immediately.  Otherwise, you will receive a letter about your results with an explanation, but please check with MyChart first.  Please remember to sign up for MyChart if you have not done so, as this will be important to you in the future with finding out test results, communicating by private email, and scheduling acute appointments online when needed.  Please return in 1 months, or sooner if needed

## 2018-02-01 ENCOUNTER — Telehealth: Payer: Self-pay

## 2018-02-01 NOTE — Telephone Encounter (Signed)
Called pt, LVM.   CRM created.  

## 2018-02-01 NOTE — Telephone Encounter (Signed)
-----   Message from Biagio Borg, MD sent at 01/31/2018 12:12 PM EDT ----- Letter sent, cont same tx except  The test results show that your current treatment is OK, except there is the new finding of iron deficiency with mild worsening anemia, and slightly lower sodium level.  The other tests are improved or stable for now.  We should refer you back to Dr Henrene Pastor of Gastroenterology to check into a reason for the iron deficiency.  It seems unlikely you would need another colonoscopy, but other testing may be indicated. You should also start Nu-iron 150 mg twice per day, and plan to return in 1 week for repeat blood testing, to check the anemia and sodium.  You should also hear from the office as well.[name] to please inform pt, I will do rx and referral, and orders for labs in 1 week

## 2018-02-09 ENCOUNTER — Encounter: Payer: Self-pay | Admitting: Internal Medicine

## 2018-02-09 ENCOUNTER — Other Ambulatory Visit (INDEPENDENT_AMBULATORY_CARE_PROVIDER_SITE_OTHER): Payer: Medicare HMO

## 2018-02-09 DIAGNOSIS — D509 Iron deficiency anemia, unspecified: Secondary | ICD-10-CM

## 2018-02-09 LAB — BASIC METABOLIC PANEL
BUN: 33 mg/dL — ABNORMAL HIGH (ref 6–23)
CO2: 28 mEq/L (ref 19–32)
Calcium: 9 mg/dL (ref 8.4–10.5)
Chloride: 89 mEq/L — ABNORMAL LOW (ref 96–112)
Creatinine, Ser: 1.28 mg/dL — ABNORMAL HIGH (ref 0.40–1.20)
GFR: 51.56 mL/min — ABNORMAL LOW (ref >60.00)
Glucose, Bld: 134 mg/dL — ABNORMAL HIGH (ref 70–99)
Potassium: 3.5 mEq/L (ref 3.5–5.1)
Sodium: 127 mEq/L — ABNORMAL LOW (ref 135–145)

## 2018-02-09 LAB — CBC WITH DIFFERENTIAL/PLATELET
Basophils Absolute: 0.1 10*3/uL (ref 0.0–0.1)
Basophils Relative: 0.9 % (ref 0.0–3.0)
Eosinophils Absolute: 0.6 10*3/uL (ref 0.0–0.7)
Eosinophils Relative: 5.2 % — ABNORMAL HIGH (ref 0.0–5.0)
HCT: 27.1 % — ABNORMAL LOW (ref 36.0–46.0)
Hemoglobin: 9.1 g/dL — ABNORMAL LOW (ref 12.0–15.0)
Lymphocytes Relative: 14.6 % (ref 12.0–46.0)
Lymphs Abs: 1.6 10*3/uL (ref 0.7–4.0)
MCHC: 33.7 g/dL (ref 30.0–36.0)
MCV: 95.1 fl (ref 78.0–100.0)
Monocytes Absolute: 1 10*3/uL (ref 0.1–1.0)
Monocytes Relative: 9.3 % (ref 3.0–12.0)
Neutro Abs: 7.8 10*3/uL — ABNORMAL HIGH (ref 1.4–7.7)
Neutrophils Relative %: 70 % (ref 43.0–77.0)
Platelets: 345 10*3/uL (ref 150.0–400.0)
RBC: 2.85 Mil/uL — ABNORMAL LOW (ref 3.87–5.11)
RDW: 14.5 % (ref 11.5–15.5)
WBC: 11.2 10*3/uL — ABNORMAL HIGH (ref 4.0–10.5)

## 2018-02-11 ENCOUNTER — Other Ambulatory Visit: Payer: Self-pay | Admitting: *Deleted

## 2018-02-11 ENCOUNTER — Telehealth: Payer: Self-pay | Admitting: Internal Medicine

## 2018-02-11 MED ORDER — AMLODIPINE BESYLATE 5 MG PO TABS: 5.0000 mg | ORAL_TABLET | Freq: Every day | ORAL | 0 refills | Status: DC

## 2018-02-11 MED ORDER — GABAPENTIN 100 MG PO CAPS: 100.0000 mg | ORAL_CAPSULE | Freq: Three times a day (TID) | ORAL | 0 refills | Status: DC

## 2018-02-11 NOTE — Telephone Encounter (Unsigned)
Copied from [place] [phone]. Topic: Quick Communication - Rx Refill/Question >> Feb 11, 2018 10:35 AM Percell Belt A wrote: Medication:  amLODipine (NORVASC) 5 MG tablet [[id number]]  gabapentin (NEURONTIN) 100 MG capsule [[id number]]  Has the patient contacted their pharmacy? No  (Agent: If no, request that the patient contact the pharmacy for the refill.) Preferred Pharmacy (with phone number or street name): [facility]  Agent: Please be advised that RX refills may take up to 3 business days. We ask that you follow-up with your pharmacy. >> Feb 11, 2018 10:48 AM Selinda Flavin B, NT wrote: Patient calling and is wanting to know if the amLODipine can be sent to the [facility] at [place] this month? States she has 1 pill left. CB#: [phone]

## 2018-02-11 NOTE — Telephone Encounter (Signed)
Call to patient - she will contact pharmacy for RF of amlodipine- and she request Rx for local pharmacy until she gets mail order.Rx sent # 30 to local pharmacy. Rx for gabapentin transferred to [facility] per patient request.

## 2018-02-11 NOTE — Telephone Encounter (Unsigned)
Copied from [place] [phone]. Topic: Quick Communication - Rx Refill/Question >> Feb 11, 2018 10:35 AM Percell Belt A wrote: Medication:  amLODipine (NORVASC) 5 MG tablet [[id number]]  gabapentin (NEURONTIN) 100 MG capsule [[id number]]  Has the patient contacted their pharmacy? No  (Agent: If no, request that the patient contact the pharmacy for the refill.) Preferred Pharmacy (with phone number or street name): [facility]  Agent: Please be advised that RX refills may take up to 3 business days. We ask that you follow-up with your pharmacy.

## 2018-02-14 ENCOUNTER — Other Ambulatory Visit: Payer: Self-pay | Admitting: *Deleted

## 2018-02-14 MED ORDER — TAMOXIFEN CITRATE 20 MG PO TABS: 20.0000 mg | ORAL_TABLET | Freq: Every day | ORAL | 0 refills | Status: DC

## 2018-02-23 ENCOUNTER — Other Ambulatory Visit: Payer: Self-pay

## 2018-02-23 ENCOUNTER — Telehealth: Payer: Self-pay | Admitting: Internal Medicine

## 2018-02-23 MED ORDER — SOLIFENACIN SUCCINATE 5 MG PO TABS: 5.0000 mg | ORAL_TABLET | Freq: Every day | ORAL | 3 refills | Status: DC

## 2018-02-23 NOTE — Telephone Encounter (Signed)
Copied from [place]. Topic: Quick Communication - Rx Refill/Question >> Feb 23, 2018 10:29 AM Synthia Innocent wrote: Medication: solifenacin (VESICARE) 5 MG tablet  Has the patient contacted their pharmacy? Yes.   (Agent: If no, request that the patient contact the pharmacy for the refill.) Preferred Pharmacy (with phone number or street name): [facility] Agent: Please be advised that RX refills may take up to 3 business days. We ask that you follow-up with your pharmacy.  Also would like to speak with someone regarding furosemide (LASIX) 20 MG tablet, states as needed, would like to know when she needs to take it? Please advise

## 2018-02-25 ENCOUNTER — Other Ambulatory Visit (HOSPITAL_COMMUNITY): Payer: Medicare HMO

## 2018-02-28 ENCOUNTER — Encounter: Payer: Self-pay | Admitting: Surgery

## 2018-02-28 ENCOUNTER — Other Ambulatory Visit: Payer: Self-pay

## 2018-02-28 ENCOUNTER — Telehealth: Payer: Self-pay | Admitting: Internal Medicine

## 2018-02-28 ENCOUNTER — Ambulatory Visit (INDEPENDENT_AMBULATORY_CARE_PROVIDER_SITE_OTHER): Payer: Medicare HMO | Admitting: Surgery

## 2018-02-28 VITALS — BP 119/65 | HR 77 | Temp 98.1°F | Resp 16 | Ht 63.0 in | Wt 160.0 lb

## 2018-02-28 DIAGNOSIS — I824Z3 Acute embolism and thrombosis of unspecified deep veins of distal lower extremity, bilateral: Secondary | ICD-10-CM

## 2018-02-28 NOTE — Progress Notes (Signed)
Vascular and Vein Specialist of [facility]  Patient name: [name] MRN: [id number] DOB: [birth year] Sex: female   REQUESTING PROVIDER:    Dr. Judi Cong   REASON FOR CONSULT:    DVT  HISTORY OF PRESENT ILLNESS:   [name] is a 82 y.o. female, who is referred today for evaluation of a DVT.  The patient underwent back surgery at the end of February 2019.  She then presented on 321 with leg swelling and pain and was found to have a left posterior tibial and peroneal vein DVT.  She was started on Eliquis.  Several days later she had the study repeated after a fall and thrombus was noted on the right tibial vein chronicity was on known.  She has been wearing compression stockings and taking her anticoagulation.  She is having trouble with her swelling.  She suffers from chronic renal insufficiency, stage III.  She has a history of breast cancer.  She is medically managed for hypertension, asthma, and reflux disease.  PAST MEDICAL HISTORY    Past Medical History:  Diagnosis Date  . Allergic rhinitis 10/30/2016  . Anemia   . Asthma   . Breast cancer of upper-outer quadrant of left female breast ([place]) 11/08/2013   ER/PR+ Her2- Left IDC   . Chronic renal insufficiency   . Chronic rhinitis   . Colon polyp   . Diastolic dysfunction 0/97/3532  . DJD (degenerative joint disease)   . Dyspnea   . Full dentures   . GERD (gastroesophageal reflux disease)   . Hearing loss   . Hypertension   . Hyponatremia   . Impaired glucose tolerance 07/18/2014  . Memory loss   . Morbid obesity ([place])   . Poor circulation   . Vertigo   . Wears glasses      FAMILY HISTORY   Family History  Problem Relation Age of Onset  . Colon cancer Mother        in her 60's  . Stomach cancer Mother   . Heart disease Unknown        remote family  . Lung cancer Brother        was a smoker  . Heart attack Son 83    SOCIAL HISTORY:   Social History    Socioeconomic History  . Marital status: Widowed    Spouse name: Not on file  . Number of children: 2  . Years of education: Not on file  . Highest education level: Not on file  Occupational History  . Occupation: owns Teacher, adult education and works PT for [employer]  Social Needs  . Financial resource strain: Not on file  . Food insecurity:    Worry: Not on file    Inability: Not on file  . Transportation needs:    Medical: Not on file    Non-medical: Not on file  Tobacco Use  . Smoking status: Never Smoker  . Smokeless tobacco: Never Used  Substance and Sexual Activity  . Alcohol use: No    Alcohol/week: 0.0 oz  . Drug use: No  . Sexual activity: Not Currently  Lifestyle  . Physical activity:    Days per week: Not on file    Minutes per session: Not on file  . Stress: Not on file  Relationships  . Social connections:    Talks on phone: Not on file    Gets together: Not on file    Attends religious service: Not on file    Active  member of club or organization: Not on file    Attends meetings of clubs or organizations: Not on file    Relationship status: Not on file  . Intimate partner violence:    Fear of current or ex partner: Not on file    Emotionally abused: Not on file    Physically abused: Not on file    Forced sexual activity: Not on file  Other Topics Concern  . Not on file  Social History Narrative  . Not on file    ALLERGIES:    Allergies  Allergen Reactions  . Hydrocodone Itching  . Tizanidine Other (See Comments)    Dizzy and fall  . Iron Hives and Other (See Comments)    [place]helps, bad constipation    CURRENT MEDICATIONS:    Current Outpatient Medications  Medication Sig Dispense Refill  . albuterol (PROVENTIL HFA;VENTOLIN HFA) 108 (90 Base) MCG/ACT inhaler Inhale 2 puffs every 6 (six) hours as needed into the lungs for wheezing or shortness of breath. 1 Inhaler 11  . amLODipine (NORVASC) 5 MG tablet Take 1 tablet (5 mg total) by mouth daily. 90  tablet 0  . apixaban (ELIQUIS) 5 MG TABS tablet Take 1 tablet (5 mg total) by mouth 2 (two) times daily. 60 tablet 5  . aspirin 81 MG tablet Take 81 mg by mouth daily.      . budesonide-formoterol (SYMBICORT) 160-4.5 MCG/ACT inhaler Inhale 2 puffs 2 (two) times daily into the lungs. 1 Inhaler 11  . dexamethasone (DECADRON) 4 MG tablet Take 1 tablet (4 mg total) by mouth every 8 (eight) hours. 21 tablet 0  . furosemide (LASIX) 20 MG tablet Take 1 tablet (20 mg total) by mouth daily as needed. (Patient taking differently: Take 20 mg by mouth daily. ) 90 tablet 3  . gabapentin (NEURONTIN) 100 MG capsule Take 1 capsule (100 mg total) by mouth 3 (three) times daily. 270 capsule 0  . HYDROcodone-acetaminophen (NORCO/VICODIN) 5-325 MG tablet   0  . iron polysaccharides (NU-IRON) 150 MG capsule Take 1 capsule (150 mg total) by mouth 2 (two) times daily. 60 capsule 5  . losartan-hydrochlorothiazide (HYZAAR) 100-25 MG tablet Take 1 tablet by mouth daily. 90 tablet 1  . meclizine (ANTIVERT) 12.5 MG tablet TAKE 1 TABLET THREE TIMES DAILY AS NEEDED  FOR  DIZZINESS (Patient taking differently: TAKE 12.5 MG THREE TIMES DAILY AS NEEDED  FOR  DIZZINESS) 270 tablet 0  . methocarbamol (ROBAXIN) 500 MG tablet Take 1 tablet (500 mg total) by mouth every 8 (eight) hours as needed for muscle spasms. 10 tablet 0  . Multiple Vitamins-Minerals (CENTRUM SILVER PO) Take 1 tablet by mouth daily.      [name] oxyCODONE-acetaminophen (PERCOCET/ROXICET) 5-325 MG tablet   0  . pantoprazole (PROTONIX) 40 MG tablet Take 1 tablet (40 mg total) by mouth daily. 90 tablet 1  . Potassium Gluconate 550 (90 K) MG TABS Take 550 mg by mouth daily.     . Simethicone (GAS-X PO) Take 1 tablet by mouth daily as needed (for gas).     . solifenacin (VESICARE) 5 MG tablet Take 1 tablet (5 mg total) by mouth daily. 90 tablet 3  . tamoxifen (NOLVADEX) 20 MG tablet Take 1 tablet (20 mg total) by mouth daily. 90 tablet 0  . tizanidine (ZANAFLEX) 2 MG  capsule   1  . triamcinolone (NASACORT AQ) 55 MCG/ACT AERO nasal inhaler Place 2 sprays into the nose daily. 1 Inhaler 12  . triamcinolone cream (KENALOG)  0.1 % Apply 1 application topically 2 (two) times daily. 30 g 0  . Vitamin D, Ergocalciferol, (DRISDOL) 50000 units CAPS capsule Take 1 capsule (50,000 Units total) by mouth every 7 (seven) days. (Patient taking differently: Take 50,000 Units by mouth every 7 (seven) days. On Friday) 12 capsule 0  . traMADol (ULTRAM) 50 MG tablet Take 1 tablet (50 mg total) by mouth every 6 (six) hours as needed. (Patient not taking: Reported on 02/28/2018) 60 tablet 1   No current facility-administered medications for this visit.     REVIEW OF SYSTEMS:   '[X]'$  denotes positive finding, '[ ]'$  denotes negative finding Cardiac  Comments:  Chest pain or chest pressure:    Shortness of breath upon exertion:    Short of breath when lying flat:    Irregular heart rhythm:        Vascular    Pain in calf, thigh, or hip brought on by ambulation:    Pain in feet at night that wakes you up from your sleep:     Blood clot in your veins: x   Leg swelling:  x I can palpate a right pedal pulse       Pulmonary    Oxygen at home:    Productive cough:  x   Wheezing:  x       Neurologic    Sudden weakness in arms or legs:     Sudden numbness in arms or legs:     Sudden onset of difficulty speaking or slurred speech:    Temporary loss of vision in one eye:     Problems with dizziness:         Gastrointestinal    Blood in stool:      Vomited blood:         Genitourinary    Burning when urinating:     Blood in urine:        Psychiatric    Major depression:         Hematologic    Bleeding problems:    Problems with blood clotting too easily:        Skin    Rashes or ulcers:        Constitutional    Fever or chills:     PHYSICAL EXAM:   Vitals:   02/28/18 1529  BP: 119/65  Pulse: 77  Resp: 16  Temp: 98.1 F (36.7 C)  TempSrc: Oral  SpO2: 95%    Weight: 160 lb (72.6 kg)  Height: '5\' 3"'$  (1.6 m)    GENERAL: The patient is a well-nourished female, in no acute distress. The vital signs are documented above. CARDIAC: There is a regular rate and rhythm.  VASCULAR:, But not left.  She has pitting edema bilaterally 1-2+.  No open wounds PULMONARY: Nonlabored respirations.  MUSCULOSKELETAL: There are no major deformities or cyanosis. NEUROLOGIC: No focal weakness or paresthesias are detected. SKIN: There are no ulcers or rashes noted. PSYCHIATRIC: The patient has a normal affect.  STUDIES:   I have reviewed her prior ultrasounds which show tibial vein DVT bilaterally.  ASSESSMENT and PLAN   DVT: I discussed with the patient that based on the location of her DVT that no intervention is recommended.  Optimal medical treatment would consist of anticoagulation, leg elevation, and compression.  We have talked about the importance of 20-30 compression, which she has been compliant with.  I would like for her to be on anticoagulation for 3 months and then we  can consider discontinuing it.  She may need a referral to hematology as she states that clotting disorders do run in her family.  Because the patient has significant bilateral swelling which sounds like it predates her DVT, I will have her follow-up with a venous insufficiency ultrasound in 3 months.   Annamarie Major, MD [facility] and Vein Specialists of [facility] [phone] Pager [phone]

## 2018-02-28 NOTE — Telephone Encounter (Signed)
Copied from [place] [id number]. Topic: Quick Communication - Rx Refill/Question >> Feb 28, 2018 12:26 PM Synthia Innocent wrote: Medication: ferrex 150mg   and 100mg  cap Has the patient contacted their pharmacy? No new pharmacy (Agent: If no, request that the patient contact the pharmacy for the refill.) Preferred Pharmacy (with phone number or street name): [facility] Agent: Please be advised that RX refills may take up to 3 business days. We ask that you follow-up with your pharmacy.

## 2018-03-01 NOTE — Telephone Encounter (Signed)
Ferrex 150 mg and 100 mg refill request.  I don't see where she is on these meds.  Pt of Dr. Jenny Reichmann.

## 2018-03-01 NOTE — Telephone Encounter (Signed)
Pt takes the generic Nu-Iron 150 mg. Sending rx to mail service.Marland KitchenJohny Chess

## 2018-03-03 ENCOUNTER — Other Ambulatory Visit: Payer: Self-pay

## 2018-03-03 DIAGNOSIS — I824Z3 Acute embolism and thrombosis of unspecified deep veins of distal lower extremity, bilateral: Secondary | ICD-10-CM

## 2018-03-03 DIAGNOSIS — M79605 Pain in left leg: Secondary | ICD-10-CM

## 2018-03-07 ENCOUNTER — Other Ambulatory Visit: Payer: Self-pay

## 2018-03-07 ENCOUNTER — Encounter: Payer: Self-pay | Admitting: Internal Medicine

## 2018-03-07 ENCOUNTER — Ambulatory Visit (HOSPITAL_COMMUNITY): Payer: Medicare HMO | Attending: Internal Medicine

## 2018-03-07 DIAGNOSIS — Z853 Personal history of malignant neoplasm of breast: Secondary | ICD-10-CM | POA: Diagnosis not present

## 2018-03-07 DIAGNOSIS — R9431 Abnormal electrocardiogram [ECG] [EKG]: Secondary | ICD-10-CM | POA: Diagnosis not present

## 2018-03-07 DIAGNOSIS — I129 Hypertensive chronic kidney disease with stage 1 through stage 4 chronic kidney disease, or unspecified chronic kidney disease: Secondary | ICD-10-CM | POA: Insufficient documentation

## 2018-03-07 DIAGNOSIS — N189 Chronic kidney disease, unspecified: Secondary | ICD-10-CM | POA: Diagnosis not present

## 2018-03-08 ENCOUNTER — Telehealth: Payer: Self-pay | Admitting: Internal Medicine

## 2018-03-08 MED ORDER — AMLODIPINE BESYLATE 5 MG PO TABS: 5.0000 mg | ORAL_TABLET | Freq: Every day | ORAL | 0 refills | Status: DC

## 2018-03-08 MED ORDER — FUROSEMIDE 20 MG PO TABS: 20.0000 mg | ORAL_TABLET | Freq: Every day | ORAL | 1 refills | Status: DC

## 2018-03-08 NOTE — Addendum Note (Signed)
Addended by: Biagio Borg on: 03/08/2018 05:03 PM   Modules accepted: Orders

## 2018-03-08 NOTE — Telephone Encounter (Signed)
Copied from [place] #[id number]. Topic: Quick Communication - See Telephone Encounter >> Mar 08, 2018  3:27 PM Clack, Laban Emperor wrote: CRM for notification. See Telephone encounter for: 03/08/18.  Butch Penny with [facility] states pt amLODipine (NORVASC) 5 MG tablet [[id number]] was sent to the wrong [facility] pharmacy.  Correct pharmacy: [place], [address]

## 2018-03-08 NOTE — Addendum Note (Signed)
Addended by: Earnstine Regal on: 03/08/2018 04:04 PM   Modules accepted: Orders

## 2018-03-08 NOTE — Telephone Encounter (Signed)
Resent to correct pharmacy.Marland KitchenJohny Acosta

## 2018-03-08 NOTE — Telephone Encounter (Signed)
Done erx 

## 2018-03-08 NOTE — Telephone Encounter (Signed)
Medication: furosemide (LASIX) 20 MG tablet, amlodipine (patient says the [facility] in [place] did not receive this) 90 day supply on both Has the patient contacted their pharmacy? Yes.   (Agent: If no, request that the patient contact the pharmacy for the refill.) Preferred Pharmacy (with phone number or street name): [place], [address] [place] [address] Phone: [phone] Fax: [phone] Agent: Please be advised that RX refills may take up to 3 business days. We ask that you follow-up with your pharmacy.  Patient says to call the "855" number listed to get clarification on where to send the medications for a 90 days supply. Patient is new to [facility] order.

## 2018-03-10 ENCOUNTER — Ambulatory Visit: Payer: Medicare HMO | Attending: Audiology | Admitting: Audiology

## 2018-03-10 DIAGNOSIS — Z87898 Personal history of other specified conditions: Secondary | ICD-10-CM | POA: Diagnosis present

## 2018-03-10 DIAGNOSIS — H903 Sensorineural hearing loss, bilateral: Secondary | ICD-10-CM

## 2018-03-10 DIAGNOSIS — R2689 Other abnormalities of gait and mobility: Secondary | ICD-10-CM

## 2018-03-10 DIAGNOSIS — H93299 Other abnormal auditory perceptions, unspecified ear: Secondary | ICD-10-CM

## 2018-03-10 DIAGNOSIS — H9190 Unspecified hearing loss, unspecified ear: Secondary | ICD-10-CM

## 2018-03-10 DIAGNOSIS — H918X9 Other specified hearing loss, unspecified ear: Secondary | ICD-10-CM | POA: Diagnosis not present

## 2018-03-10 NOTE — Procedures (Signed)
Outpatient Audiology and [facility]  [place], [place] [address]  [phone]   Audiological Evaluation  Patient Name: [name]  Status: Outpatient   DOB: 01-09-1936    Diagnosis: Hearing Loss, bilateral                MRN: [id number] Date:  03/10/2018     Referent: Biagio Borg, MD  History: [name] was seen for an audiological evaluation. Accompanied by: [name], daughter. Primary Concern: Trouble hearing Pain: None History of hearing problems: Y - family noticed hearing loss about 6-12 months ago. History of ear infections: N History of ear surgery or "tubes" : N History of dizziness/vertigo:   Y- started a year ago. Was Laying in bed, got up and the room was spinning. It lasted a couple of days. Went to the physician. Currently not having vertigo for the past 4-5 months.  History of balance issues:  Y- started 2-3 years ago. Tinnitus: Y - intermittent started a few months ago and has been getting worse. Sounds like a "motor".  Sound sensitivity: N History of occupational noise exposure: Y - "worked at [employer]" but wore hearing protection. History of hypertension: Y- controlled with medication. History of diabetes:  N Family history of hearing loss: N    Evaluation: Conventional pure tone audiometry from 250Hz  - 8000Hz  with using insert earphones.  Hearing Thresholds are symmetrical ranging from 30-35 dBHLat 250Hz ; 40 dBHL at 500Hz ; 45 dBHL from 1000Hz  - 4000Hz  and 35-45 dBHL at 8000Hz . The hearing loss appears sensorineural bilaterally.Reliability is good Speech reception levels (repeating words near threshold) using recorded spondee word lists:  Right ear: 45 dBHL.  Left ear:  40 dBHL Word recognition (at comfortably loud volumes) using recorded word lists at 45 dBHL, in quiet.  Right ear: 90%.  Left ear:   76% Word recognition in minimal background noise:  +5 dBHL  Right ear: 50%         Left ear:  50%  Tympanometry shows normal middle ear volume, pressure and compliance (Type A) bilaterally. Ipsilateral acoustic reflexes range from 85-90dB at 500Hz -1000Hz . 2000Hz   100dB on  The right and 85 dB on the left.  No response at 4000Hz  bilaterally. Ipsilateral acoustic reflex decay is negative bilaterally at 1000Hz .  CONCLUSION:      [name] has a mild to moderate sensorineural hearing loss bilaterally. This amount of hearing loss would adversely affect speech communication at normal conversational speech levels. Of concern is that [name] reports changes in hearing, balance and tinnitus over the past 6-12 months. Today word recognition is excellent in the right ear and fair in the left ear in quiet at very loud levels, which are "comfortably loud for [name]. In minimal background noise, word recognition drops to poor in each ear.  Further evaluation by an ENT is needed prior to a hearing aid evaluation.   In addition, referral to the [facility] for a balance assessment is recommended.  Acoustic reflex decay is negative bilaterally, which was evaluated because of the reported intermittent tinnitus.  Tinnitus was not matched today because it was not occurring during he time of this visit. The test results were discussed and [name] counseled.   RECOMMENDATIONS: 1.   Referral to ENT because of recently developing hearing loss, tinnitus. 2.   Referral to [facility] rehabilitation for a balance assessment due to recent balance issues and fall  risk. 3.   Referral for a hearing aid evaluation. 4.   Monitor hearing, word recognition and tinnitus closely with a repeat audiological evaluation in 3-6 months (earlier if there is any change in hearing or ear pressure).  This evaluation may be completed here, at the ENT office or in conjunction with a hearing aid evaluation.  5.   Certified for CaptionCall  amplified telephone for home use. 6.  Strategies that help improve hearing include: A) Face the speaker directly. Optimal is having the speakers face well - lit.  Unless amplified, being within 3-6 feet of the speaker will enhance word recognition. B) Avoid having the speaker back-lit as this will minimize the ability to use cues from lip-reading, facial expression and gestures. C)  Word recognition is poorer in background noise. For optimal word recognition, turn off the TV, radio or noisy fan when engaging in conversation. In a restaurant, try to sit away from noise sources and close to the primary speaker.  D)  Ask for topic clarification from time to time in order to remain in the conversation.  Most people don't mind repeating or clarifying a point when asked.  If needed, explain the difficulty hearing in background noise or hearing loss.  Finley Dinkel L. Heide Spark Au.D., CCC-A Doctor of Audiology  03/10/2018   cc: Biagio Borg, MD

## 2018-03-14 ENCOUNTER — Ambulatory Visit: Payer: Medicare HMO | Admitting: Internal Medicine

## 2018-03-17 ENCOUNTER — Telehealth: Payer: Self-pay

## 2018-03-17 NOTE — Telephone Encounter (Signed)
Called pt, no answer.

## 2018-03-17 NOTE — Telephone Encounter (Signed)
Dr. Jenny Reichmann do you know what letter she is referring to?  Copied from [place] [phone]. Topic: Inquiry >> Mar 17, 2018  1:18 PM Conception Chancy, NT wrote: Reason for CRM: patient is calling and states she received a letter from out patient rehab for hearing and was told to contact the office and see if Dr. Jenny Reichmann received it. Please advise.

## 2018-03-17 NOTE — Telephone Encounter (Signed)
No, I dont thinks so, but we cant be completely sure.  Perhaps she can let us know if she has request or concern, o/w we can see at her next visit

## 2018-03-22 ENCOUNTER — Telehealth: Payer: Self-pay | Admitting: Internal Medicine

## 2018-03-22 MED ORDER — MOMETASONE FURO-FORMOTEROL FUM 200-5 MCG/ACT IN AERO: 2.0000 | INHALATION_SPRAY | Freq: Two times a day (BID) | RESPIRATORY_TRACT | 11 refills | Status: DC

## 2018-03-22 NOTE — Telephone Encounter (Signed)
Ok for change symbicort to dulera per pt request  Pt needs to stop the symbicort as this is a similar medication  shirron to let pt know

## 2018-03-22 NOTE — Telephone Encounter (Signed)
Copied from [place] [id number]. Topic: Quick Communication - Rx Refill/Question >> Mar 22, 2018  2:53 PM Scherrie Gerlach wrote: Medication: mometasone-formoterol (DULERA) 200-5 MCG/ACT inhaler 2 puff   Pt was put on this in the hospital and states it helps.  Pt would like to stay on this inhaler  [place], [place] [phone] (Phone) [phone] (Fax)

## 2018-03-23 MED ORDER — MOMETASONE FURO-FORMOTEROL FUM 200-5 MCG/ACT IN AERO: 2.0000 | INHALATION_SPRAY | Freq: Two times a day (BID) | RESPIRATORY_TRACT | 11 refills | Status: DC

## 2018-03-23 NOTE — Telephone Encounter (Signed)
Done

## 2018-03-23 NOTE — Telephone Encounter (Signed)
Pt has been informed and expressed understanding.  

## 2018-03-23 NOTE — Telephone Encounter (Signed)
Pt would like to have her Rx for [facility] sent to the mail order instead of her local [facility].  Pharmacy: [place], [address]  [phone] (Phone) [phone] (Fax)

## 2018-03-23 NOTE — Addendum Note (Signed)
Addended by: Juliet Rude on: 03/23/2018 09:57 AM   Modules accepted: Orders

## 2018-03-27 NOTE — Progress Notes (Addendum)
[place]  [phone] [phone]     ID: [name] OB: 1936/10/10  MR#: [id number]  HLK#:[id number]  PCP: Biagio Borg, MD GYN:  [name], MD SU: [name], MD OTHER MD: [name], MD; [name], MD; [name], MD  CHIEF COMPLAINT: Estrogen receptor positive breast Cancer  CURRENT TREATMENT: Anastrozole  BREAST CANCER HISTORY: From the original intake note:  The patient underwent routine screening mammography at [facility] 10/09/2013 showing a 1 cm cluster of calcifications in the right breast at 5:00. There was also a 9 mm irregular density in the left breast laterally. Bilateral diagnostic mammography with tomography at [facility] 11/01/2013 confirmed a 1 cm cluster of vascular calcifications at the 5:00 position of the right breast, which were felt to be stable. However in the left breast there was a new 1.1 cm mass with spiculated margins. This was at 1:00, 8 cm from the nipple. Ultrasound of the left breast the same day showed the mass to measure 6 mm and to be hypoechoic. The left axilla was unremarkable.  Biopsy of the left breast mass in question 11/06/2013 showed ([id number]) and invasive ductal carcinoma, grade 2, estrogen receptor 100% positive, progesterone receptor 95% positive, both with strong staining intensity, with an MIB-1 of 19% and no HER-2 amplification, the signals ratio being 0.95 and the number per cell 2.00. E-cadherin was positive.  The patient's subsequent history is as detailed below  INTERVAL HISTORY: [name] returns today for follow up of her breast cancer. She presents to the office today with her daughter, [name]. She continues on tamoxifen, with good tolerance. She notes that she is due for her mammogram and she goes that at [place].   Since her last visit to the office, she had a colonoscopy on 01/19/2017 with results showing: Surgical [P], random sites with benign colonic mucosa. No significant  inflammation, granulomata or adenomatous epithelium identified.   She also had lumbar surgery in February 2019 with multiple follow ups since then. She notes that her back pain is resolved since the surgery, however she notes that she has had BLE pain and issues with ambulating which she contributes to the medications. She had in-home rehab which alleviated her symptoms.   Finally on 01/17/2018 she had bilateral lower extremity Doppler studies which showed bilateral DVTs.  She is currently on apixaban, with good tolerance.  REVIEW OF SYSTEMS: [name] reports that for exercise, she uses a recumbent bike that is at her daughter's home. She also notes that she is living with her daughter now.She notes that she can't walk as far as she would like due to SOB and wheezing. She was informed within the past 4-5 months that she may be dealing with asthma. She was evaluated by Dr. Jenny Reichmann and informed that she may have asthma and was given an inhaler.  She is still taking decadron and anticoagulants and denies issues with bleeding. She denies unusual headaches, visual changes, nausea, vomiting, or dizziness. There has been no unusual cough, phlegm production, or pleurisy. This been no change in bowel or bladder habits. She denies unexplained fatigue or unexplained weight loss, bleeding, rash, or fever. A detailed review of systems was otherwise stable.    PAST MEDICAL HISTORY: Past Medical History:  Diagnosis Date  . Allergic rhinitis 10/30/2016  . Anemia   . Asthma   . Breast cancer of upper-outer quadrant of left female breast ([place]) 11/08/2013   ER/PR+ Her2- Left IDC   . Chronic renal insufficiency   .  Chronic rhinitis   . Colon polyp   . Diastolic dysfunction 04/25/7792  . DJD (degenerative joint disease)   . Dyspnea   . Full dentures   . GERD (gastroesophageal reflux disease)   . Hearing loss   . Hypertension   . Hyponatremia   . Impaired glucose tolerance 07/18/2014  . Memory loss   . Morbid  obesity ([place])   . Poor circulation   . Vertigo   . Wears glasses     PAST SURGICAL HISTORY: Past Surgical History:  Procedure Laterality Date  . ABDOMINAL HYSTERECTOMY    . BREAST LUMPECTOMY WITH NEEDLE LOCALIZATION AND AXILLARY SENTINEL LYMPH NODE BX Left 12/04/2013   Procedure: BREAST LUMPECTOMY WITH NEEDLE LOCALIZATION AND AXILLARY SENTINEL LYMPH NODE BX;  Surgeon: Shann Medal, MD;  Location: [facility];  Service: General;  Laterality: Left;  . CATARACT EXTRACTION  2009   rt  . COLONOSCOPY    . EYE SURGERY Bilateral    cataract surgery  . KNEE ARTHROSCOPY     both  . LUMBAR LAMINECTOMY/DECOMPRESSION MICRODISCECTOMY Left 12/23/2017   Procedure: Left Lumbar One-Two Laminectomy with microdiscectomy;  Surgeon: Eustace Moore, MD;  Location: [facility];  Service: Neurosurgery;  Laterality: Left;  Left L1-2 Laminectomy with microdiscectomy  . TONSILLECTOMY    . TOTAL KNEE ARTHROPLASTY  2002   rt  . TOTAL KNEE ARTHROPLASTY  2003   left  . VESICOVAGINAL FISTULA CLOSURE W/ TAH  1980   status post hysterectomy with bilateral sloping oophorectomy  FAMILY HISTORY Family History  Problem Relation Age of Onset  . Colon cancer Mother        in her 39's  . Stomach cancer Mother   . Heart disease Unknown        remote family  . Lung cancer Brother        was a smoker  . Heart attack Son 47   the patient's mother died at the age of 58, with a history of colon cancer. The patient's father died at the age of 21 with "stomach cancer". The patient had 5 brothers, 3 sisters. One brother had lung cancer diagnosed at age 8. One maternal aunt had colon cancer at age 74. There is no history of breast or ovarian cancer in the family to her knowledge.  GYNECOLOGIC HISTORY:   (Reviewed 03/28/2014) Menarche age 31, first live birth age 59, the patient is [place] P2. She underwent hysterectomy with bilateral salpingo-oophorectomy approximately 34 years ago. She did not use hormone replacement.  She did use birth control remotely, for about 15 years, with no complications.  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 She is widowed, lives with her daughter, with no pets. Son [name] died from a myocardial infarction at the age of 59. Daughter Alberto Schoch works in [place] as an Microbiologist (at [employer]). The patient has 2 grandchildren and one great-grandchild. She is a Psychologist, forensic.    ADVANCED DIRECTIVES: In place; the patient's daughter [name] is her healthcare power of attorney. [name] can be reached at [phone] (cell), and [phone] (work).   HEALTH MAINTENANCE:  (Updated 03/28/2014) Social History   Tobacco Use  . Smoking status: Never Smoker  . Smokeless tobacco: Never Used  Substance Use Topics  . Alcohol use: No    Alcohol/week: 0.0 oz  . Drug use: No    Colonoscopy:  Oct 2011/DrHenrene Pastor  PAP:  s/p hysterectomy   Bone density: 03/15/2013 at [facility] in [place], shows normal bone density  Lipid panel:  April 2015/Dr. John   Allergies  Allergen Reactions  . Hydrocodone Itching  . Tizanidine Other (See Comments)    Dizzy and fall  . Iron Hives and Other (See Comments)    [place], bad constipation    Current Outpatient Medications  Medication Sig Dispense Refill  . albuterol (PROVENTIL HFA;VENTOLIN HFA) 108 (90 Base) MCG/ACT inhaler Inhale 2 puffs every 6 (six) hours as needed into the lungs for wheezing or shortness of breath. 1 Inhaler 11  . amLODipine (NORVASC) 5 MG tablet Take 1 tablet (5 mg total) by mouth daily. 90 tablet 0  . anastrozole (ARIMIDEX) 1 MG tablet Take 1 tablet (1 mg total) by mouth daily. 90 tablet 4  . apixaban (ELIQUIS) 5 MG TABS tablet Take 1 tablet (5 mg total) by mouth 2 (two) times daily. 60 tablet 5  . aspirin 81 MG tablet Take 81 mg by mouth daily.      [name] dexamethasone (DECADRON) 4 MG tablet Take 1 tablet (4 mg total) by  mouth every 8 (eight) hours. 21 tablet 0  . furosemide (LASIX) 20 MG tablet Take 1 tablet (20 mg total) by mouth daily. 90 tablet 1  . gabapentin (NEURONTIN) 100 MG capsule Take 1 capsule (100 mg total) by mouth 3 (three) times daily. 270 capsule 0  . HYDROcodone-acetaminophen (NORCO/VICODIN) 5-325 MG tablet   0  . iron polysaccharides (NU-IRON) 150 MG capsule Take 1 capsule (150 mg total) by mouth 2 (two) times daily. 60 capsule 5  . losartan-hydrochlorothiazide (HYZAAR) 100-25 MG tablet Take 1 tablet by mouth daily. 90 tablet 1  . meclizine (ANTIVERT) 12.5 MG tablet TAKE 1 TABLET THREE TIMES DAILY AS NEEDED  FOR  DIZZINESS (Patient taking differently: TAKE 12.5 MG THREE TIMES DAILY AS NEEDED  FOR  DIZZINESS) 270 tablet 0  . methocarbamol (ROBAXIN) 500 MG tablet Take 1 tablet (500 mg total) by mouth every 8 (eight) hours as needed for muscle spasms. 10 tablet 0  . mometasone-formoterol (DULERA) 200-5 MCG/ACT AERO Inhale 2 puffs into the lungs 2 (two) times daily. 13 g 11  . Multiple Vitamins-Minerals (CENTRUM SILVER PO) Take 1 tablet by mouth daily.      [name] oxyCODONE-acetaminophen (PERCOCET/ROXICET) 5-325 MG tablet   0  . pantoprazole (PROTONIX) 40 MG tablet Take 1 tablet (40 mg total) by mouth daily. 90 tablet 1  . Potassium Gluconate 550 (90 K) MG TABS Take 550 mg by mouth daily.     . Simethicone (GAS-X PO) Take 1 tablet by mouth daily as needed (for gas).     . solifenacin (VESICARE) 5 MG tablet Take 1 tablet (5 mg total) by mouth daily. 90 tablet 3  . tizanidine (ZANAFLEX) 2 MG capsule   1  . traMADol (ULTRAM) 50 MG tablet Take 1 tablet (50 mg total) by mouth every 6 (six) hours as needed. (Patient not taking: Reported on 02/28/2018) 60 tablet 1  . triamcinolone (NASACORT AQ) 55 MCG/ACT AERO nasal inhaler Place 2 sprays into the nose daily. 1 Inhaler 12  . triamcinolone cream (KENALOG) 0.1 % Apply 1 application topically 2 (two) times daily. 30 g 0  . Vitamin D, Ergocalciferol, (DRISDOL) 50000  units CAPS capsule Take 1 capsule (50,000 Units total) by mouth every 7 (seven) days. (Patient taking differently: Take 50,000 Units by mouth every 7 (seven) days. On Friday) 12 capsule 0   No current  facility-administered medications for this visit.     OBJECTIVE: Older African American woman using a cane  Vitals:   03/28/18 0950  BP: (!) 156/81  Pulse: (!) 48  Resp: 18  Temp: 98.4 F (36.9 C)  SpO2: 100%     Body mass index is 28.11 kg/m.    ECOG FS: 2 Filed Weights   03/28/18 0950  Weight: 158 lb 11.2 oz (72 kg)   Sclerae unicteric, EOMs intact Oropharynx clear and moist No cervical or supraclavicular adenopathy Lungs no rales or rhonchi Heart regular rate and rhythm Abd soft, nontender, positive bowel sounds MSK no focal spinal tenderness, no upper extremity lymphedema Neuro: nonfocal, well oriented, appropriate affect Breasts: The right breast has undergone lumpectomy.  There is no evidence of local recurrence.  The left breast is benign.  Both axillae are benign.  LAB RESULTS:   Lab Results  Component Value Date   WBC 11.2 (H) 02/09/2018   NEUTROABS 7.8 (H) 02/09/2018   HGB 9.1 (L) 02/09/2018   HCT 27.1 (L) 02/09/2018   MCV 95.1 02/09/2018   PLT 345.0 02/09/2018      Chemistry      Component Value Date/Time   NA 127 (L) 02/09/2018 0823   NA 138 07/27/2016 0935   K 3.5 02/09/2018 0823   K 4.4 07/27/2016 0935   CL 89 (L) 02/09/2018 0823   CO2 28 02/09/2018 0823   CO2 25 07/27/2016 0935   BUN 33 (H) 02/09/2018 0823   BUN 24.7 07/27/2016 0935   CREATININE 1.28 (H) 02/09/2018 0823   CREATININE 1.4 (H) 07/27/2016 0935      Component Value Date/Time   CALCIUM 9.0 02/09/2018 0823   CALCIUM 9.1 07/27/2016 0935   ALKPHOS 39 01/17/2018 1441   ALKPHOS 50 07/27/2016 0935   AST 24 01/17/2018 1441   AST 23 07/27/2016 0935   ALT 20 01/17/2018 1441   ALT 18 07/27/2016 0935   BILITOT 0.7 01/17/2018 1441   BILITOT 0.32 07/27/2016 0935       STUDIES: Left  diagnostic mammography at [facility] for follow-up of microcalcifications was obtained 03/31/2016. Breast density was category B. There were no findings of concern. The patient will resume yearly mammography 10/01/2016.  ASSESSMENT: 82 y.o. [place] woman status post left breast biopsy 11/06/2013 for a clinical T1b N0, stage IA invasive ductal carcinoma, grade 2, estrogen receptor 100% positive, progesterone receptor 95% positive, with an MIB-1 of 19% and no HER-2 amplification  (1) status post left lumpectomy and sentinel lymph node sampling 12/04/2013 for a pT1c pN0, stage IA invasive ductal carcinoma, grade 1, repeat HER-2 again negative, with ample margins  (2) anastrozole Mar 2015-Dec 2015, letrozole Jan 2016-Feb 2016. Started tamoxifen March 2016, switched back to anastrozole June 2019 because of bilateral DVTs documented March 2019  (3) opted to forgo radiation therapy   PLAN: Harshini Is now a little over 4 years out from definitive surgery for her breast cancer with no evidence of disease recurrence.  This is very favorable.  She tolerated tamoxifen much better than aromatase inhibitors.  However I am concerned that tamoxifen is a procoagulant and she did have bilateral DVTs.  So long as she continues on apixaban that would not matter, but if she goes off that at some point it might be a concern.  Accordingly we are stopping the tamoxifen now and switching her to anastrozole.  She has a good understanding of the possible toxicities, side effects and complications of that agent.  I am  hoping she will be able to tolerate the anastrozole for 1 year, at which point she will be ready to "graduate".  She is behind on her mammography and I have gone ahead and entered the appropriate orders for her to have that done this month.  She will see me again next July.  She knows to call for any problems that may develop before the next visit.      s now nearly 3 years out from definitive  surgery for her breast cancer with no evidence of disease recurrence. This is very favorable.  She is tolerating tamoxifen well. At this point did not flashes are minimal and she does not want to take any medication for them.  She does have a persistent cough. This does not sound like pneumonia. It would be fairly classic for reflux except she is already on to reflux medicines. I think it would be prudent to obtain a chest x-ray which we are obtaining today.  Otherwise she will have her next mammogram next week. I'm going to see her again a year from now, after her next mammogram in 2018.  She knows to call for any problems that may develop before that visit.  Kella Splinter, [name], MD  03/28/18 10:18 AM Medical Oncology and Hematology [facility] [address] [place], [place] [address] Tel. [phone]    Fax. [phone]    I, Soijett Blue am acting as scribe for Dr. Sarajane Jews C. Cherl Gorney.  I, Lurline Del MD, have reviewed the above documentation for accuracy and completeness, and I agree with the above.  Addendum: Bilateral diagnostic mammography at [facility] 12/15/2017 found the breast density to be category B.  There was no evidence of malignancy.

## 2018-03-28 ENCOUNTER — Telehealth: Payer: Self-pay | Admitting: Oncology

## 2018-03-28 ENCOUNTER — Inpatient Hospital Stay: Payer: Medicare HMO | Attending: Oncology | Admitting: Oncology

## 2018-03-28 ENCOUNTER — Telehealth: Payer: Self-pay

## 2018-03-28 VITALS — BP 156/81 | HR 48 | Temp 98.4°F | Resp 18 | Ht 63.0 in | Wt 158.7 lb

## 2018-03-28 DIAGNOSIS — N189 Chronic kidney disease, unspecified: Secondary | ICD-10-CM | POA: Diagnosis not present

## 2018-03-28 DIAGNOSIS — Z79811 Long term (current) use of aromatase inhibitors: Secondary | ICD-10-CM

## 2018-03-28 DIAGNOSIS — Z7982 Long term (current) use of aspirin: Secondary | ICD-10-CM | POA: Insufficient documentation

## 2018-03-28 DIAGNOSIS — H9190 Unspecified hearing loss, unspecified ear: Secondary | ICD-10-CM

## 2018-03-28 DIAGNOSIS — C50412 Malignant neoplasm of upper-outer quadrant of left female breast: Secondary | ICD-10-CM | POA: Diagnosis not present

## 2018-03-28 DIAGNOSIS — Z170 Estrogen receptor positive status [ER+]: Secondary | ICD-10-CM | POA: Diagnosis not present

## 2018-03-28 DIAGNOSIS — I82413 Acute embolism and thrombosis of femoral vein, bilateral: Secondary | ICD-10-CM | POA: Diagnosis not present

## 2018-03-28 DIAGNOSIS — I129 Hypertensive chronic kidney disease with stage 1 through stage 4 chronic kidney disease, or unspecified chronic kidney disease: Secondary | ICD-10-CM

## 2018-03-28 DIAGNOSIS — R05 Cough: Secondary | ICD-10-CM

## 2018-03-28 MED ORDER — ANASTROZOLE 1 MG PO TABS: 1.0000 mg | ORAL_TABLET | Freq: Every day | ORAL | 4 refills | Status: DC

## 2018-03-28 NOTE — Telephone Encounter (Signed)
Copied from [place] [id number]. Topic: Referral - Status >> Mar 28, 2018  2:08 PM Carolyn Stare wrote:  Pt call to say she went to Audiology and they said she need to see a ENT and pt call today to ask for a referral  Pt phone number  [phone]

## 2018-03-28 NOTE — Addendum Note (Signed)
Addended by: Biagio Borg on: 03/28/2018 04:12 PM   Modules accepted: Orders

## 2018-03-28 NOTE — Telephone Encounter (Signed)
Ok this is done 

## 2018-03-28 NOTE — Telephone Encounter (Signed)
Gave avs and calendar ° °

## 2018-04-12 ENCOUNTER — Ambulatory Visit: Payer: Self-pay

## 2018-04-12 NOTE — Telephone Encounter (Signed)
Pt. called to report "deep cough" that has been ongoing for about 3 weeks.  Stated the episodes are moderate, and can be worse at night.  Denied SOB with usual activity around the house.  Reported she has intermittent shortness of breath with walking a distance; ie: from car to the clinic.   Reported occasional wheezing.  Stated the cough is usually nonproductive, but infreq. coughs up "clear" sputum.  Denied fever/ chills.  Denied chest pain.  Stated the coughing episode last night was more disruptive, and she didn't sleep very well. Reported she is still waiting on the "Dulera" Inhaler from [facility]; there was a delay in getting this filled, due to insurance.  Reported the cough seems to be worse over the past 3 weeks.  Denied vomiting with coughing episodes, but reported the episodes can be very deep and persistent.  Has tried "Delsym" cough syrup, that was recommended by a pharmacist.  Care advice given per protocol.  Pt. Verb. Understanding.  Greater than 45 min. spent discussing pt's concerns and symptoms.  Appt. given for 6/19 @ 4:00 PM with PCP.  Pt. Agrees with plan.                Reason for Disposition . [1] Continuous (nonstop) coughing interferes with work or school AND [2] no improvement using cough treatment per protocol  Answer Assessment - Initial Assessment Questions 1. ONSET: "When did the cough begin?"      About 3 weeks ago  2. SEVERITY: "How bad is the cough today?"      Episodes of coughing; worse at night 3. RESPIRATORY DISTRESS: "Describe your breathing."      Stated she has problems with SOB and wheezing over past year  4. FEVER: "Do you have a fever?" If so, ask: "What is your temperature, how was it measured, and when did it start?"     Denies fever or chills 5. SPUTUM: "Describe the color of your sputum" (e.g., clear, white, yellow, green), "Has there been any change recently?"     Coughs up Clear sputum infrequently 6. HEMOPTYSIS: "Are you coughing up any  blood?" If so ask: "How much, flecks, streaks, tablespoons, etc.?"    Denied 7. CARDIAC HISTORY: "Do you have any history of heart disease?" (e.g., heart attack, congestive heart failure)      Denied CHF or MI 8. LUNG HISTORY: "Do you have any history of lung disease?"  (e.g., pulmonary embolus, asthma, emphysema/COPD)    Hx of allergies, Asthma 9. OTHER SYMPTOMS: "Do you have any other symptoms? (e.g., runny nose, wheezing, chest pain)     Occasional wheezing; shortness of breath with activity outside of her home  Protocols used: COUGH - CHRONIC-A-AH

## 2018-04-13 ENCOUNTER — Encounter: Payer: Self-pay | Admitting: Internal Medicine

## 2018-04-13 ENCOUNTER — Ambulatory Visit (INDEPENDENT_AMBULATORY_CARE_PROVIDER_SITE_OTHER): Payer: Medicare HMO | Admitting: Internal Medicine

## 2018-04-13 VITALS — BP 136/82 | HR 81 | Temp 98.1°F | Ht 63.0 in | Wt 159.0 lb

## 2018-04-13 DIAGNOSIS — H109 Unspecified conjunctivitis: Secondary | ICD-10-CM | POA: Diagnosis not present

## 2018-04-13 DIAGNOSIS — R21 Rash and other nonspecific skin eruption: Secondary | ICD-10-CM

## 2018-04-13 DIAGNOSIS — J45909 Unspecified asthma, uncomplicated: Secondary | ICD-10-CM

## 2018-04-13 DIAGNOSIS — J4521 Mild intermittent asthma with (acute) exacerbation: Secondary | ICD-10-CM | POA: Diagnosis not present

## 2018-04-13 DIAGNOSIS — H9193 Unspecified hearing loss, bilateral: Secondary | ICD-10-CM | POA: Diagnosis not present

## 2018-04-13 MED ORDER — GUAIFENESIN-CODEINE 100-10 MG/5ML PO SYRP: 5.0000 mL | ORAL_SOLUTION | Freq: Four times a day (QID) | ORAL | 0 refills | Status: DC | PRN

## 2018-04-13 MED ORDER — TRIAMCINOLONE ACETONIDE 0.1 % EX CREA: 1.0000 "application " | TOPICAL_CREAM | Freq: Two times a day (BID) | CUTANEOUS | 0 refills | Status: DC

## 2018-04-13 MED ORDER — METHYLPREDNISOLONE ACETATE 80 MG/ML IJ SUSP
80.0000 mg | Freq: Once | INTRAMUSCULAR | Status: AC
  Administered 2018-04-13: 80 mg via INTRAMUSCULAR

## 2018-04-13 MED ORDER — ERYTHROMYCIN 5 MG/GM OP OINT: 1.0000 "application " | TOPICAL_OINTMENT | Freq: Four times a day (QID) | OPHTHALMIC | 0 refills | Status: DC

## 2018-04-13 MED ORDER — PREDNISONE 10 MG PO TABS: ORAL_TABLET | ORAL | 0 refills | Status: DC

## 2018-04-13 MED ORDER — AZITHROMYCIN 250 MG PO TABS: ORAL_TABLET | ORAL | 1 refills | Status: DC

## 2018-04-13 NOTE — Assessment & Plan Note (Signed)
Mild to mod, for antibx course,  to f/u any worsening symptoms or concerns 

## 2018-04-13 NOTE — Patient Instructions (Addendum)
You had the steroid shot today  Please take all new medication as prescribed - the antibiotic, cough medicine as needed., prednisone, eye ointment antibiotic, and steroid cream for the arm and leg  Please continue all other medications as before, including to start the dulera as you can  Please have the pharmacy call with any other refills you may need.  Please keep your appointments with your specialists as you may have planned  You will be contacted regarding the referral for: ENT  Please remember to sign up for MyChart if you have not done so, as this will be important to you in the future with finding out test results, communicating by private email, and scheduling acute appointments online when needed.

## 2018-04-13 NOTE — Assessment & Plan Note (Addendum)
Mild, for depomedrol IM 80, predpac asd, antibx, cough med prn,  to f/u any worsening symptoms or concerns  Note:  Total time for pt hx, exam, review of record with pt in the room, determination of diagnoses and plan for further eval and tx is > 40 min, with over 50% spent in coordination and counseling of patient including the differential dx, tx, further evaluation and other management of asthmatic bronchitis, rash, hearing loss, and left conjunctivitis

## 2018-04-13 NOTE — Assessment & Plan Note (Signed)
C/w dermatitis, for triam cr prn

## 2018-04-13 NOTE — Progress Notes (Signed)
Subjective:    Patient ID: [name], female    DOB: 1936-09-17, 82 y.o.   MRN: [id number]  HPI  Here with daughter with 3-4 days gradually worsening low grade fever, left eye discomfort redness without vision change, ST, mild prod cough with worsening mild sob/doe/wheeziness and tightness, but Pt denies chest pain,  orthopnea, PND, increased LE swelling, palpitations, dizziness or syncope. Also incidentally has itchy slightly raised nontender irritated areas both to the right arm just distal to the elbow and right ankle.  Also had recent hearing evaluation per audiology who rec'd to her to suggest an ENT referral.   Pt denies polydipsia, polyuria, or low sugar symptoms such as weakness or confusion improved with po intake.  Pt states overall good compliance with meds, trying to follow lower cholesterol, diabetic diet, wt overall stable but little exercise however.    Past Medical History:  Diagnosis Date  . Allergic rhinitis 10/30/2016  . Anemia   . Asthma   . Breast cancer of upper-outer quadrant of left female breast ([place]) 11/08/2013   ER/PR+ Her2- Left IDC   . Chronic renal insufficiency   . Chronic rhinitis   . Colon polyp   . Diastolic dysfunction 06/29/91  . DJD (degenerative joint disease)   . Dyspnea   . Full dentures   . GERD (gastroesophageal reflux disease)   . Hearing loss   . Hypertension   . Hyponatremia   . Impaired glucose tolerance 07/18/2014  . Memory loss   . Morbid obesity ([place])   . Poor circulation   . Vertigo   . Wears glasses    Past Surgical History:  Procedure Laterality Date  . ABDOMINAL HYSTERECTOMY    . BREAST LUMPECTOMY WITH NEEDLE LOCALIZATION AND AXILLARY SENTINEL LYMPH NODE BX Left 12/04/2013   Procedure: BREAST LUMPECTOMY WITH NEEDLE LOCALIZATION AND AXILLARY SENTINEL LYMPH NODE BX;  Surgeon: Shann Medal, MD;  Location: [facility];  Service: General;  Laterality: Left;  . CATARACT EXTRACTION  2009   rt  . COLONOSCOPY      . EYE SURGERY Bilateral    cataract surgery  . KNEE ARTHROSCOPY     both  . LUMBAR LAMINECTOMY/DECOMPRESSION MICRODISCECTOMY Left 12/23/2017   Procedure: Left Lumbar One-Two Laminectomy with microdiscectomy;  Surgeon: Eustace Moore, MD;  Location: [facility];  Service: Neurosurgery;  Laterality: Left;  Left L1-2 Laminectomy with microdiscectomy  . TONSILLECTOMY    . TOTAL KNEE ARTHROPLASTY  2002   rt  . TOTAL KNEE ARTHROPLASTY  2003   left  . VESICOVAGINAL FISTULA CLOSURE W/ TAH  1980    reports that she has never smoked. She has never used smokeless tobacco. She reports that she does not drink alcohol or use drugs. family history includes Colon cancer in her mother; Heart attack (age of onset: 78) in her son; Heart disease in her unknown relative; Lung cancer in her brother; Stomach cancer in her mother. Allergies  Allergen Reactions  . Hydrocodone Itching  . Tizanidine Other (See Comments)    Dizzy and fall  . Iron Hives and Other (See Comments)    Whelps, bad constipation   Current Outpatient Medications on File Prior to Visit  Medication Sig Dispense Refill  . albuterol (PROVENTIL HFA;VENTOLIN HFA) 108 (90 Base) MCG/ACT inhaler Inhale 2 puffs every 6 (six) hours as needed into the lungs for wheezing or shortness of breath. 1 Inhaler 11  . amLODipine (NORVASC) 5 MG tablet Take 1 tablet (5 mg total)  by mouth daily. 90 tablet 0  . anastrozole (ARIMIDEX) 1 MG tablet Take 1 tablet (1 mg total) by mouth daily. 90 tablet 4  . apixaban (ELIQUIS) 5 MG TABS tablet Take 1 tablet (5 mg total) by mouth 2 (two) times daily. 60 tablet 5  . aspirin 81 MG tablet Take 81 mg by mouth daily.      [name] dexamethasone (DECADRON) 4 MG tablet Take 1 tablet (4 mg total) by mouth every 8 (eight) hours. 21 tablet 0  . furosemide (LASIX) 20 MG tablet Take 1 tablet (20 mg total) by mouth daily. 90 tablet 1  . gabapentin (NEURONTIN) 100 MG capsule Take 1 capsule (100 mg total) by mouth 3 (three) times daily. 270  capsule 0  . HYDROcodone-acetaminophen (NORCO/VICODIN) 5-325 MG tablet   0  . iron polysaccharides (NU-IRON) 150 MG capsule Take 1 capsule (150 mg total) by mouth 2 (two) times daily. 60 capsule 5  . losartan-hydrochlorothiazide (HYZAAR) 100-25 MG tablet Take 1 tablet by mouth daily. 90 tablet 1  . meclizine (ANTIVERT) 12.5 MG tablet TAKE 1 TABLET THREE TIMES DAILY AS NEEDED  FOR  DIZZINESS (Patient taking differently: TAKE 12.5 MG THREE TIMES DAILY AS NEEDED  FOR  DIZZINESS) 270 tablet 0  . methocarbamol (ROBAXIN) 500 MG tablet Take 1 tablet (500 mg total) by mouth every 8 (eight) hours as needed for muscle spasms. 10 tablet 0  . mometasone-formoterol (DULERA) 200-5 MCG/ACT AERO Inhale 2 puffs into the lungs 2 (two) times daily. 13 g 11  . Multiple Vitamins-Minerals (CENTRUM SILVER PO) Take 1 tablet by mouth daily.      [name] oxyCODONE-acetaminophen (PERCOCET/ROXICET) 5-325 MG tablet   0  . pantoprazole (PROTONIX) 40 MG tablet Take 1 tablet (40 mg total) by mouth daily. 90 tablet 1  . Potassium Gluconate 550 (90 K) MG TABS Take 550 mg by mouth daily.     . Simethicone (GAS-X PO) Take 1 tablet by mouth daily as needed (for gas).     . solifenacin (VESICARE) 5 MG tablet Take 1 tablet (5 mg total) by mouth daily. 90 tablet 3  . tizanidine (ZANAFLEX) 2 MG capsule   1  . traMADol (ULTRAM) 50 MG tablet Take 1 tablet (50 mg total) by mouth every 6 (six) hours as needed. 60 tablet 1  . triamcinolone (NASACORT AQ) 55 MCG/ACT AERO nasal inhaler Place 2 sprays into the nose daily. 1 Inhaler 12  . Vitamin D, Ergocalciferol, (DRISDOL) 50000 units CAPS capsule Take 1 capsule (50,000 Units total) by mouth every 7 (seven) days. (Patient taking differently: Take 50,000 Units by mouth every 7 (seven) days. On Friday) 12 capsule 0   No current facility-administered medications on file prior to visit.    Review of Systems  Constitutional: Negative for other unusual diaphoresis or sweats HENT: Negative for ear  discharge or swelling Eyes: Negative for other worsening visual disturbances Respiratory: Negative for stridor or other swelling  Gastrointestinal: Negative for worsening distension or other blood Genitourinary: Negative for retention or other urinary change Musculoskeletal: Negative for other MSK pain or swelling Skin: Negative for color change or other new lesions Neurological: Negative for worsening tremors and other numbness  Psychiatric/Behavioral: Negative for worsening agitation or other fatigue All other system neg per pt    Objective:   Physical Exam BP 136/82   Pulse 81   Temp 98.1 F (36.7 C) (Oral)   Ht '5\' 3"'$  (1.6 m)   Wt 159 lb (72.1 kg)   SpO2 96%  BMI 28.17 kg/m  VS noted, mild ill Constitutional: Pt appears in NAD HENT: Head: NCAT.  Right Ear: External ear normal.  Left Ear: External ear normal.  Eyes: . Pupils are equal, round, and reactive to light. left Conjunctivae with erythema and EOM are normal Nose: without d/c or deformity Neck: Neck supple. Gross normal ROM Cardiovascular: Normal rate and regular rhythm.   Pulmonary/Chest: Effort normal and breath sounds decreased without rales but with mild bilat wheezing.  Neurological: Pt is alert. At baseline orientation, motor grossly intact Skin: Skin is warm. + 1 cm areas of nontender mild slightly raised erythema/dark rash to right arm just distal to the elbow and right lateral ankle , no other new lesions, no LE edema Psychiatric: Pt behavior is normal without agitation  No other exam findings       Assessment & Plan:

## 2018-04-13 NOTE — Assessment & Plan Note (Signed)
Ok for ENT referral 

## 2018-04-14 ENCOUNTER — Telehealth: Payer: Self-pay | Admitting: Internal Medicine

## 2018-04-14 MED ORDER — APIXABAN 5 MG PO TABS: 5.0000 mg | ORAL_TABLET | Freq: Two times a day (BID) | ORAL | 1 refills | Status: DC

## 2018-04-14 NOTE — Telephone Encounter (Signed)
Copied from [place] [id number]. Topic: General - Other >> Apr 14, 2018  9:32 AM Alfredia Ferguson R wrote: Reason for CRM: Pt called to inform Dr Jenny Reichmann of medicine he had asked what Dr.Magrinat had her taking. Medcine is Anastrozole 1mg  po daily

## 2018-04-14 NOTE — Telephone Encounter (Signed)
Patient called to inform Dr. Jenny Reichmann that Dr. Jana Hakim prescribed for her to take Anastrozole 1 mg po daily.

## 2018-04-14 NOTE — Telephone Encounter (Signed)
Copied from [place] [id number]. Topic: Quick Communication - Rx Refill/Question >> Apr 14, 2018  9:34 AM Alfredia Ferguson R wrote: Medication: apixaban (ELIQUIS) 5 MG TABS tablet  Has the patient contacted their pharmacy?Yes (Agent: If no, request that the patient contact the pharmacy for the refill.) (Agent: If yes, when and what did the pharmacy advise?)  Preferred Pharmacy (with phone number or street name): [facility], [place] [phone] (Phone) [phone] (Fax)      Agent: Please be advised that RX refills may take up to 3 business days. We ask that you follow-up with your pharmacy.

## 2018-04-15 NOTE — Telephone Encounter (Signed)
Medication has been added to med list  

## 2018-04-20 ENCOUNTER — Ambulatory Visit: Payer: Medicare HMO | Admitting: Audiology

## 2018-04-27 ENCOUNTER — Other Ambulatory Visit (INDEPENDENT_AMBULATORY_CARE_PROVIDER_SITE_OTHER): Payer: Medicare HMO

## 2018-04-27 ENCOUNTER — Telehealth: Payer: Self-pay

## 2018-04-27 ENCOUNTER — Ambulatory Visit: Payer: Medicare HMO | Admitting: Internal Medicine

## 2018-04-27 ENCOUNTER — Encounter: Payer: Self-pay | Admitting: Internal Medicine

## 2018-04-27 VITALS — BP 126/68 | HR 64 | Ht 63.0 in | Wt 158.5 lb

## 2018-04-27 DIAGNOSIS — D649 Anemia, unspecified: Secondary | ICD-10-CM

## 2018-04-27 DIAGNOSIS — K219 Gastro-esophageal reflux disease without esophagitis: Secondary | ICD-10-CM

## 2018-04-27 LAB — CBC WITH DIFFERENTIAL/PLATELET
Basophils Absolute: 0.1 10*3/uL (ref 0.0–0.1)
Basophils Relative: 0.6 % (ref 0.0–3.0)
Eosinophils Absolute: 0.2 10*3/uL (ref 0.0–0.7)
Eosinophils Relative: 1.6 % (ref 0.0–5.0)
HCT: 29.3 % — ABNORMAL LOW (ref 36.0–46.0)
Hemoglobin: 10.1 g/dL — ABNORMAL LOW (ref 12.0–15.0)
Lymphocytes Relative: 16.8 % (ref 12.0–46.0)
Lymphs Abs: 1.6 10*3/uL (ref 0.7–4.0)
MCHC: 34.5 g/dL (ref 30.0–36.0)
MCV: 93.9 fl (ref 78.0–100.0)
Monocytes Absolute: 1.2 10*3/uL — ABNORMAL HIGH (ref 0.1–1.0)
Monocytes Relative: 12.1 % — ABNORMAL HIGH (ref 3.0–12.0)
Neutro Abs: 6.5 10*3/uL (ref 1.4–7.7)
Neutrophils Relative %: 68.9 % (ref 43.0–77.0)
Platelets: 254 10*3/uL (ref 150.0–400.0)
RBC: 3.12 Mil/uL — ABNORMAL LOW (ref 3.87–5.11)
RDW: 14.6 % (ref 11.5–15.5)
WBC: 9.5 10*3/uL (ref 4.0–10.5)

## 2018-04-27 LAB — FOLATE: Folate: >24.1 ng/mL (ref >5.9)

## 2018-04-27 LAB — FERRITIN: Ferritin: 110.9 ng/mL (ref 10.0–291.0)

## 2018-04-27 LAB — IRON: Iron: 46 ug/dL (ref 42–145)

## 2018-04-27 LAB — VITAMIN B12: Vitamin B-12: 1436 pg/mL — ABNORMAL HIGH (ref 211–911)

## 2018-04-27 NOTE — Progress Notes (Signed)
HISTORY OF PRESENT ILLNESS:  [name] is a 82 y.o. female with multiple significant medical problems who is referred by Dr. John regarding anemia, possibly iron deficient. The patient was last evaluated in this office 11/06/2016 regarding fecal incontinence, loose stools, personal history of adenomatous colon polyps, and rectal bleeding. See that dictation. She subsequently underwent complete colonoscopy 01/19/2017. Examination was complete and the preparation good.She was found to have diverticulosis and internal hemorrhoids. Random colon biopsies were normal. No neoplasia. No routine follow-up recommended due to age and comorbidities. Blood work in March revealed anemia with a hemoglobin of 9.1. Follow-up blood work in April revealed hemoglobin of 8.8. Her baseline hemoglobin over the past 4 years is generally been between 10 and 10.5. Her MCV is normal. Normal B12 and folate. Iron and iron saturation were tested and found to be low. No ferritin level. Patient does have chronic renal insufficiency with GFR in the 40 range. Also of importance, the patient is on chronic oral anticoagulation in the form of Eliquis. Also, she has been on chronic PPI for history of GERD. GI review of systems is currently negative. She was placed on iron therapy and has noticed some dark stools since. Her weight has been stable. Last hemoglobin in mid April was 9.1.  REVIEW OF SYSTEMS:  All non-GI ROS negative except for arthritis, back pain, cough, fatigue, headaches, hearing problems, itching, skin rash, leg swelling, voice change, shortness of breath  Past Medical History:  Diagnosis Date  . Allergic rhinitis 10/30/2016  . Anemia   . Asthma   . Breast cancer of upper-outer quadrant of left female breast ([facility]) 11/08/2013   ER/PR+ Her2- Left IDC   . Chronic renal insufficiency   . Chronic rhinitis   . Colon polyp   . Diastolic dysfunction 07/10/2016  . DJD (degenerative joint disease)   . Dyspnea   . Full  dentures   . GERD (gastroesophageal reflux disease)   . Hearing loss   . Hypertension   . Hyponatremia   . Impaired glucose tolerance 07/18/2014  . Memory loss   . Morbid obesity ([facility])   . Poor circulation   . Vertigo   . Wears glasses     Past Surgical History:  Procedure Laterality Date  . ABDOMINAL HYSTERECTOMY    . BREAST LUMPECTOMY WITH NEEDLE LOCALIZATION AND AXILLARY SENTINEL LYMPH NODE BX Left 12/04/2013   Procedure: BREAST LUMPECTOMY WITH NEEDLE LOCALIZATION AND AXILLARY SENTINEL LYMPH NODE BX;  Surgeon: David H Newman, MD;  Location: [facility];  Service: General;  Laterality: Left;  . CATARACT EXTRACTION  2009   rt  . COLONOSCOPY    . EYE SURGERY Bilateral    cataract surgery  . KNEE ARTHROSCOPY     both  . LUMBAR LAMINECTOMY/DECOMPRESSION MICRODISCECTOMY Left 12/23/2017   Procedure: Left Lumbar One-Two Laminectomy with microdiscectomy;  Surgeon: Jones, David S, MD;  Location: [facility];  Service: Neurosurgery;  Laterality: Left;  Left L1-2 Laminectomy with microdiscectomy  . TONSILLECTOMY    . TOTAL KNEE ARTHROPLASTY  2002   rt  . TOTAL KNEE ARTHROPLASTY  2003   left  . VESICOVAGINAL FISTULA CLOSURE W/ TAH  1980    Social History [name]  reports that she has never smoked. She has never used smokeless tobacco. She reports that she does not drink alcohol or use drugs.  family history includes Colon cancer in her mother; Heart attack (age of onset: 51) in her son; Heart disease in her unknown   relative; Lung cancer in her brother; Stomach cancer in her mother.  Allergies  Allergen Reactions  . Hydrocodone Itching  . Tizanidine Other (See Comments)    Dizzy and fall  . Iron Hives and Other (See Comments)    Whelps, bad constipation       PHYSICAL EXAMINATION: Vital signs: BP 126/68   Pulse 64   Ht 5' 3" (1.6 m)   Wt 158 lb 8 oz (71.9 kg)   BMI 28.08 kg/m   Constitutional: pleasant, chronically ill-appearing, no acute  distress Psychiatric: alert and oriented x3, cooperative Eyes: extraocular movements intact, anicteric, conjunctiva pink Mouth: oral pharynx moist, no lesions Neck: supple no lymphadenopathy Cardiovascular: heart regular rate and rhythm, no murmur Lungs: clear to auscultation bilaterally Abdomen: soft, nontender, nondistended, no obvious ascites, no peritoneal signs, normal bowel sounds, no organomegaly Rectal:ommitted Extremities: no clubbing or cyanosis. 1+ lower extremity edema bilaterally Skin: no lesions on visible extremities Neuro: No focal deficits. No asterixis. Cranial nerves intact.   ASSESSMENT:  #1. Chronic normocytic anemia. Slight decrease in blood counts from her baseline. Suspect anemia multifactorial with multiple chronic medical problems and renal insufficiency. Possibly a component of slow blood loss from chronic anticoagulation therapy. No clinical bleeding. Colonoscopy last year as described. On chronic PPI which should protect upper GI mucosa #2. Multiple medical problems #3. History of adenomatous colon polyps. Aged out of surveillance. Last examination March 2018   PLAN:  #1. Laboratories today including anemia studies and CBC #2. Continue PPI for upper GI mucosal protection #3. No plans for endoscopic evaluations #4. If the anemia is symptomatic or progressive without obvious GI bleeding I would consider hematology assessment. I will leave this to Dr. Jenny Reichmann #5. Continue iron supplementation as this may be helpful and she is tolerating it well #6. Return to the care of Dr. Mady Haagensen Blood work from today has returned. Hemoglobin 10.1. Baseline. MCV 93.9. Normal ferritin level. Normal iron. No changes  25 minutes spent face-to-face with the patient. Greater than 50% a time use for counseling regarding her chronic anemia, my impressions, its assessment, and plans

## 2018-04-27 NOTE — Patient Instructions (Signed)
Your provider has requested that you go to the basement level for lab work before leaving today. Press "B" on the elevator. The lab is located at the first door on the left as you exit the elevator.  Please follow up as needed

## 2018-04-29 NOTE — Telephone Encounter (Signed)
Contacted

## 2018-05-16 ENCOUNTER — Telehealth: Payer: Self-pay | Admitting: *Deleted

## 2018-05-16 NOTE — Telephone Encounter (Signed)
This RN returned VM left by pt stating " the medication Dr Jana Hakim last gave me is making me cough all the time. The pharmacist told me the coughing could be coming from the pill anastrozole "  " I need to stop this pill "  Return call number given as [phone].  Per return call -obtained identified VM of patient. Message left to return call to discuss above.  Of note pt had cough at last office visit with Dr Jannifer Rodney early June when anastrozole was prescribed.  Pt has known history of asthma.  Will await return call to discuss symptoms possible exacerbated by anastrozole with possible hold x 2 weeks for evaluation of improvement and further recommendations.

## 2018-05-16 NOTE — Telephone Encounter (Signed)
Pt returned call to this RN per her earlier VM and this RN leaving her a VM -  Margarette states since changing from the tamoxifen to anastrozole - her cough has significantly increased. Per her discussion with her pharmacist - she was informed the anastrozole could be worsening her cough.  This RN discussed above as well as pt's known history of asthma.   Plan per call is pt will hold the anastrozole for one week - understands to monitor changes in cough and call report to this RN next week.  This note will be given to MD for communication of call- no further needs at this time.

## 2018-05-18 ENCOUNTER — Telehealth: Payer: Self-pay | Admitting: Internal Medicine

## 2018-05-18 NOTE — Telephone Encounter (Signed)
Copied from [place] [id number]. Topic: Quick Communication - Rx Refill/Question >> May 18, 2018 12:28 PM Mcneil, Ja-Kwan wrote: Medication: predniSONE (DELTASONE) 10 MG tablet  Has the patient contacted their pharmacy? no  Preferred Pharmacy (with phone number or street name): [place], [place] - [address] [phone] (Phone) [phone] (Fax)   Agent: Please be advised that RX refills may take up to 3 business days. We ask that you follow-up with your pharmacy.

## 2018-05-19 ENCOUNTER — Ambulatory Visit: Payer: Self-pay

## 2018-05-19 NOTE — Telephone Encounter (Signed)
See triage encounter.

## 2018-05-19 NOTE — Telephone Encounter (Signed)
Patient called in with c/o "cough." She says "I saw the doctor in June and was doing better, but the cough is still there and sometimes it is a hard cough. It will just come on at any time and it's getting to be bothersome. I don't cough up anything, no SOB, no fever, just wheezing." According to protocol, see PCP within 3 days, only Same Day appointment slots available. I advised the patient I will send this to the office to get approval for Monday, 05/23/18 at 1620 by Dr. Jenny Reichmann and someone will call back with the appointment, she verbalized understanding, care advice given.   Reason for Disposition . Cough has been present for > 3 weeks  Answer Assessment - Initial Assessment Questions 1. ONSET: "When did the cough begin?"      June when I came in the office 2. SEVERITY: "How bad is the cough today?"      Not as bad today, but it will just start anytime 3. RESPIRATORY DISTRESS: "Describe your breathing."      No SOB 4. FEVER: "Do you have a fever?" If so, ask: "What is your temperature, how was it measured, and when did it start?"     No 5. HEMOPTYSIS: "Are you coughing up any blood?" If so ask: "How much?" (flecks, streaks, tablespoons, etc.)     No 6. TREATMENT: "What have you done so far to treat the cough?" (e.g., meds, fluids, humidifier)     Meds 7. CARDIAC HISTORY: "Do you have any history of heart disease?" (e.g., heart attack, congestive heart failure)      I don't know 8. LUNG HISTORY: "Do you have any history of lung disease?"  (e.g., pulmonary embolus, asthma, emphysema)     No 9. PE RISK FACTORS: "Do you have a history of blood clots?" (or: recent major surgery, recent prolonged travel, bedridden)     Yes, recent back surgery, had blood clots in my legs before 10. OTHER SYMPTOMS: "Do you have any other symptoms? (e.g., runny nose, wheezing, chest pain)      Wheezing 11. PREGNANCY: "Is there any chance you are pregnant?" "When was your last menstrual period?"       No 12.  TRAVEL: "Have you traveled out of the country in the last month?" (e.g., travel history, exposures)       No  Protocols used: COUGH - ACUTE NON-PRODUCTIVE-A-AH

## 2018-05-19 NOTE — Telephone Encounter (Signed)
Noted  

## 2018-05-24 ENCOUNTER — Telehealth: Payer: Self-pay | Admitting: *Deleted

## 2018-05-24 NOTE — Telephone Encounter (Signed)
Filippa called to this RN to state the cough has continued - note pt felt increased cough was due to anastrozole and per call last week - held the medication.  Per discussion - she started back on the anastrozole and is scheduled to see Dr Jenny Reichmann ( primary MD ) next week for cough.  No further needs at this time.

## 2018-05-25 NOTE — Telephone Encounter (Signed)
Pt scheduled her appt for shingles shot on 05/31/18 at 920

## 2018-05-30 ENCOUNTER — Ambulatory Visit (INDEPENDENT_AMBULATORY_CARE_PROVIDER_SITE_OTHER): Payer: Medicare HMO | Admitting: Internal Medicine

## 2018-05-30 ENCOUNTER — Encounter: Payer: Self-pay | Admitting: Internal Medicine

## 2018-05-30 ENCOUNTER — Ambulatory Visit (INDEPENDENT_AMBULATORY_CARE_PROVIDER_SITE_OTHER)
Admission: RE | Admit: 2018-05-30 | Discharge: 2018-05-30 | Disposition: A | Discharge status: Home / Self Care | Payer: Medicare HMO | Source: Ambulatory Visit | Attending: Internal Medicine | Admitting: Internal Medicine

## 2018-05-30 VITALS — BP 132/78 | HR 91 | Temp 97.8°F | Ht 63.0 in | Wt 158.0 lb

## 2018-05-30 DIAGNOSIS — N183 Chronic kidney disease, stage 3 unspecified: Secondary | ICD-10-CM

## 2018-05-30 DIAGNOSIS — I10 Essential (primary) hypertension: Secondary | ICD-10-CM

## 2018-05-30 DIAGNOSIS — R05 Cough: Secondary | ICD-10-CM | POA: Diagnosis not present

## 2018-05-30 DIAGNOSIS — J302 Other seasonal allergic rhinitis: Secondary | ICD-10-CM | POA: Diagnosis not present

## 2018-05-30 DIAGNOSIS — J4521 Mild intermittent asthma with (acute) exacerbation: Secondary | ICD-10-CM | POA: Diagnosis not present

## 2018-05-30 DIAGNOSIS — R7302 Impaired glucose tolerance (oral): Secondary | ICD-10-CM | POA: Diagnosis not present

## 2018-05-30 MED ORDER — ALBUTEROL SULFATE HFA 108 (90 BASE) MCG/ACT IN AERS: 2.0000 | INHALATION_SPRAY | Freq: Four times a day (QID) | RESPIRATORY_TRACT | 11 refills | Status: DC | PRN

## 2018-05-30 MED ORDER — GUAIFENESIN-CODEINE 100-10 MG/5ML PO SYRP: 5.0000 mL | ORAL_SOLUTION | Freq: Four times a day (QID) | ORAL | 0 refills | Status: DC | PRN

## 2018-05-30 MED ORDER — MOMETASONE FURO-FORMOTEROL FUM 200-5 MCG/ACT IN AERO: 2.0000 | INHALATION_SPRAY | Freq: Two times a day (BID) | RESPIRATORY_TRACT | 11 refills | Status: DC

## 2018-05-30 MED ORDER — MONTELUKAST SODIUM 10 MG PO TABS: 10.0000 mg | ORAL_TABLET | Freq: Every day | ORAL | 3 refills | Status: DC

## 2018-05-30 MED ORDER — METHYLPREDNISOLONE ACETATE 80 MG/ML IJ SUSP
80.0000 mg | Freq: Once | INTRAMUSCULAR | Status: AC
Start: 2018-05-30 — End: 2018-05-30
  Administered 2018-05-30: 80 mg via INTRAMUSCULAR

## 2018-05-30 MED ORDER — PREDNISONE 10 MG PO TABS: ORAL_TABLET | ORAL | 0 refills | Status: DC

## 2018-05-30 NOTE — Assessment & Plan Note (Signed)
stable overall by history and exam, recent data reviewed with pt, and pt to continue medical treatment as before,  to f/u any worsening symptoms or concerns BP Readings from Last 3 Encounters:  05/30/18 132/78  04/27/18 126/68  04/13/18 136/82

## 2018-05-30 NOTE — Progress Notes (Signed)
Subjective:    Patient ID: [name], female    DOB: 12-25-1935, 82 y.o.   MRN: [id number]  HPI   Here to f/u - C/o non prod cough x 5 wks assoc with clinical asthmatic bronchitis at first it seemed, improved with tx and cough med but now out of cough med.  Wheezing has also returned intermittent with non prod cough and some sob.  States Is taking the dulera which helped, did get worse one time with running out.   Worse to lie down, sort gets to choking and has to jump up bc so hard she has to go to another room to wake up the family, honey seems to help.  Had an attack  In church yesterday where she had to leave the service.   Stopped her anastrazole for 1 wk per oncology but no cough improvement, so now to restart.  No hx of CHF  - last echo with normal EF.   Also c/o bilat thigh pain, was better at one point after depo shot, and prednisone for asthmatic bronchitis no returned as well.  Has been referred in the past to ortho, no other specific tx and seemed to focus on hands and knees instead of back per pt Also has bilat hearing loss, has yet to see aduiology Has appt with Vascular aug 26 with venous study prior.   Past Medical History:  Diagnosis Date  . Allergic rhinitis 10/30/2016  . Anemia   . Asthma   . Breast cancer of upper-outer quadrant of left female breast ([facility]) 11/08/2013   ER/PR+ Her2- Left IDC   . Chronic renal insufficiency   . Chronic rhinitis   . Colon polyp   . Diastolic dysfunction 2/37/6283  . DJD (degenerative joint disease)   . Dyspnea   . Full dentures   . GERD (gastroesophageal reflux disease)   . Hearing loss   . Hypertension   . Hyponatremia   . Impaired glucose tolerance 07/18/2014  . Memory loss   . Morbid obesity ([place])   . Poor circulation   . Vertigo   . Wears glasses    Past Surgical History:  Procedure Laterality Date  . ABDOMINAL HYSTERECTOMY    . BREAST LUMPECTOMY WITH NEEDLE LOCALIZATION AND AXILLARY SENTINEL LYMPH NODE BX Left  12/04/2013   Procedure: BREAST LUMPECTOMY WITH NEEDLE LOCALIZATION AND AXILLARY SENTINEL LYMPH NODE BX;  Surgeon: Shann Medal, MD;  Location: [facility];  Service: General;  Laterality: Left;  . CATARACT EXTRACTION  2009   rt  . COLONOSCOPY    . EYE SURGERY Bilateral    cataract surgery  . KNEE ARTHROSCOPY     both  . LUMBAR LAMINECTOMY/DECOMPRESSION MICRODISCECTOMY Left 12/23/2017   Procedure: Left Lumbar One-Two Laminectomy with microdiscectomy;  Surgeon: Eustace Moore, MD;  Location: [facility];  Service: Neurosurgery;  Laterality: Left;  Left L1-2 Laminectomy with microdiscectomy  . TONSILLECTOMY    . TOTAL KNEE ARTHROPLASTY  2002   rt  . TOTAL KNEE ARTHROPLASTY  2003   left  . VESICOVAGINAL FISTULA CLOSURE W/ TAH  1980    reports that she has never smoked. She has never used smokeless tobacco. She reports that she does not drink alcohol or use drugs. family history includes Colon cancer in her mother; Heart attack (age of onset: 80) in her son; Heart disease in her unknown relative; Lung cancer in her brother; Stomach cancer in her mother. Allergies  Allergen Reactions  . Hydrocodone Itching  .  Tizanidine Other (See Comments)    Dizzy and fall  . Iron Hives and Other (See Comments)    [place], bad constipation   Current Outpatient Medications on File Prior to Visit  Medication Sig Dispense Refill  . amLODipine (NORVASC) 5 MG tablet Take 1 tablet (5 mg total) by mouth daily. 90 tablet 0  . anastrozole (ARIMIDEX) 1 MG tablet Take 1 tablet (1 mg total) by mouth daily. 90 tablet 4  . apixaban (ELIQUIS) 5 MG TABS tablet Take 1 tablet (5 mg total) by mouth 2 (two) times daily. 60 tablet 1  . aspirin 81 MG tablet Take 81 mg by mouth daily.      [name] azithromycin (ZITHROMAX) 250 MG tablet 2 tab by mouth day 1, then 1 per day 6 tablet 1  . erythromycin ophthalmic ointment Place 1 application into the left eye 4 (four) times daily. For 10 days 3.5 g 0  . furosemide (LASIX) 20 MG  tablet Take 1 tablet (20 mg total) by mouth daily. 90 tablet 1  . gabapentin (NEURONTIN) 100 MG capsule Take 1 capsule (100 mg total) by mouth 3 (three) times daily. 270 capsule 0  . HYDROcodone-acetaminophen (NORCO/VICODIN) 5-325 MG tablet   0  . iron polysaccharides (NU-IRON) 150 MG capsule Take 1 capsule (150 mg total) by mouth 2 (two) times daily. 60 capsule 5  . losartan-hydrochlorothiazide (HYZAAR) 100-25 MG tablet Take 1 tablet by mouth daily. 90 tablet 1  . meclizine (ANTIVERT) 12.5 MG tablet TAKE 1 TABLET THREE TIMES DAILY AS NEEDED  FOR  DIZZINESS (Patient taking differently: TAKE 12.5 MG THREE TIMES DAILY AS NEEDED  FOR  DIZZINESS) 270 tablet 0  . methocarbamol (ROBAXIN) 500 MG tablet Take 1 tablet (500 mg total) by mouth every 8 (eight) hours as needed for muscle spasms. 10 tablet 0  . Multiple Vitamins-Minerals (CENTRUM SILVER PO) Take 1 tablet by mouth daily.      [name] oxyCODONE-acetaminophen (PERCOCET/ROXICET) 5-325 MG tablet   0  . pantoprazole (PROTONIX) 40 MG tablet Take 1 tablet (40 mg total) by mouth daily. 90 tablet 1  . Potassium Gluconate 550 (90 K) MG TABS Take 550 mg by mouth daily.     . Simethicone (GAS-X PO) Take 1 tablet by mouth daily as needed (for gas).     . solifenacin (VESICARE) 5 MG tablet Take 1 tablet (5 mg total) by mouth daily. 90 tablet 3  . tizanidine (ZANAFLEX) 2 MG capsule   1  . traMADol (ULTRAM) 50 MG tablet Take 1 tablet (50 mg total) by mouth every 6 (six) hours as needed. 60 tablet 1  . triamcinolone (NASACORT AQ) 55 MCG/ACT AERO nasal inhaler Place 2 sprays into the nose daily. 1 Inhaler 12  . triamcinolone cream (KENALOG) 0.1 % Apply 1 application topically 2 (two) times daily. 30 g 0  . Vitamin D, Ergocalciferol, (DRISDOL) 50000 units CAPS capsule Take 1 capsule (50,000 Units total) by mouth every 7 (seven) days. (Patient taking differently: Take 50,000 Units by mouth every 7 (seven) days. On Friday) 12 capsule 0   No current facility-administered  medications on file prior to visit.    Review of Systems  Constitutional: Negative for other unusual diaphoresis or sweats HENT: Negative for ear discharge or swelling Eyes: Negative for other worsening visual disturbances Respiratory: Negative for stridor or other swelling  Gastrointestinal: Negative for worsening distension or other blood Genitourinary: Negative for retention or other urinary change Musculoskeletal: Negative for other MSK pain or swelling Skin: Negative  for color change or other new lesions Neurological: Negative for worsening tremors and other numbness  Psychiatric/Behavioral: Negative for worsening agitation or other fatigue All other system neg per pt    Objective:   Physical Exam BP 132/78   Pulse 91   Temp 97.8 F (36.6 C) (Oral)   Ht '5\' 3"'$  (1.6 m)   Wt 158 lb (71.7 kg)   SpO2 97%   BMI 27.99 kg/m  VS noted, not ill appaering Constitutional: Pt appears in NAD HENT: Head: NCAT.  Right Ear: External ear normal.  Left Ear: External ear normal.  Eyes: . Pupils are equal, round, and reactive to light. Conjunctivae and EOM are normal Nose: without d/c or deformity Neck: Neck supple. Gross normal ROM Cardiovascular: Normal rate and regular rhythm.   Pulmonary/Chest: Effort normal and breath sounds decreased without rales or wheezing.  Neurological: Pt is alert. At baseline orientation, motor grossly intact Skin: Skin is warm. No rashes, other new lesions, no LE edema Psychiatric: Pt behavior is normal without agitation  No other exam findings Lab Results  Component Value Date   WBC 9.5 04/27/2018   HGB 10.1 (L) 04/27/2018   HCT 29.3 (L) 04/27/2018   PLT 254.0 04/27/2018   GLUCOSE 134 (H) 02/09/2018   CHOL 150 01/13/2018   TRIG 111.0 01/13/2018   HDL 61.10 01/13/2018   LDLCALC 67 01/13/2018   ALT 20 01/17/2018   AST 24 01/17/2018   NA 127 (L) 02/09/2018   K 3.5 02/09/2018   CL 89 (L) 02/09/2018   CREATININE 1.28 (H) 02/09/2018   BUN 33 (H)  02/09/2018   CO2 28 02/09/2018   TSH 1.53 01/13/2018   INR 1.35 01/17/2018   HGBA1C 5.8 01/13/2018      Assessment & Plan:

## 2018-05-30 NOTE — Assessment & Plan Note (Signed)
Mild to mod but persistent, for depomedrol IM 80, predpac asd, add singulair 10 qd , cont dulera, cough med prn, for cxr today, consider pulm referral if not improved

## 2018-05-30 NOTE — Assessment & Plan Note (Signed)
stable overall by history and exam, recent data reviewed with pt, and pt to continue medical treatment as before,  to f/u any worsening symptoms or concerns  

## 2018-05-30 NOTE — Patient Instructions (Addendum)
You had the steroid shot today  Please take all new medication as prescribed - the prednisone, and singulair 10 mg per day  Please continue all other medications as before, and refills have been done if requested - the cough medicine  Please have the pharmacy call with any other refills you may need.  Please keep your appointments with your specialists as you may have planned  Please go to the XRAY Department in the Basement (go straight as you get off the elevator) for the x-ray testing  You will be contacted by phone if any changes need to be made immediately.  Otherwise, you will receive a letter about your results with an explanation, but please check with MyChart first.  Please remember to sign up for MyChart if you have not done so, as this will be important to you in the future with finding out test results, communicating by private email, and scheduling acute appointments online when needed.  Please return in 6 months, or sooner if needed

## 2018-06-07 ENCOUNTER — Telehealth: Payer: Self-pay | Admitting: Internal Medicine

## 2018-06-07 MED ORDER — AMLODIPINE BESYLATE 5 MG PO TABS: 5.0000 mg | ORAL_TABLET | Freq: Every day | ORAL | 1 refills | Status: DC

## 2018-06-07 NOTE — Telephone Encounter (Signed)
Copied from [place] [id number]. Topic: Quick Communication - Rx Refill/Question >> Jun 07, 2018  9:24 AM Judyann Munson wrote: Medication: amLODipine (NORVASC) 5 MG tablet  Has the patient contacted their pharmacy? no  Preferred Pharmacy (with phone number or street name): [id number] [place], [place]. AT [address] [phone] (Phone) [phone] (Fax)    Agent: Please be advised that RX refills may take up to 3 business days. We ask that you follow-up with your pharmacy.

## 2018-06-07 NOTE — Telephone Encounter (Signed)
Reviewed chart pt is up-to-date sent refills to [facility]  

## 2018-06-08 ENCOUNTER — Telehealth: Payer: Self-pay | Admitting: Internal Medicine

## 2018-06-08 ENCOUNTER — Ambulatory Visit: Payer: Self-pay | Admitting: *Deleted

## 2018-06-08 MED ORDER — LOSARTAN POTASSIUM-HCTZ 100-25 MG PO TABS: 1.0000 | ORAL_TABLET | Freq: Every day | ORAL | 1 refills | Status: DC

## 2018-06-08 NOTE — Telephone Encounter (Signed)
Copied from [place] [id number]. Topic: Quick Communication - Rx Refill/Question >> Jun 08, 2018 11:36 AM Bea Graff, NT wrote: Medication: losartan-hydrochlorothiazide (HYZAAR) 100-25 MG tablet  Has the patient contacted their pharmacy? Yes.   (Agent: If no, request that the patient contact the pharmacy for the refill.) (Agent: If yes, when and what did the pharmacy advise?)  Preferred Pharmacy (with phone number or street name): [id number] [place], [place]. AT [address] [place] [phone] (Phone) [phone] (Fax)    Agent: Please be advised that RX refills may take up to 3 business days. We ask that you follow-up with your pharmacy.

## 2018-06-08 NOTE — Telephone Encounter (Signed)
Copied from [place] [id number]. Topic: Quick Communication - Rx Refill/Question >> Jun 08, 2018 12:18 PM Selinda Flavin B, NT wrote: Medication: guaiFENesin-codeine (CHERATUSSIN AC) 100-10 MG/5ML syrup  Has the patient contacted their pharmacy? Yes.   (Agent: If no, request that the patient contact the pharmacy for the refill.) (Agent: If yes, when and what did the pharmacy advise?)  Preferred Pharmacy (with phone number or street name): [id number] [place], [place]. AT [address]  Agent: Please be advised that RX refills may take up to 3 business days. We ask that you follow-up with your pharmacy.

## 2018-06-08 NOTE — Telephone Encounter (Signed)
Called pt regarding her prescription refill; she says that it was called [facility] but [facility]; therefore she would [facility] [place], [place]; she explains that this is the original prescription not a 2nd request; the pt can be contacted at [phone] and she says that a message can be left at this number; will route to office for final disposition; the pt was seen in office by Dr Cathlean Cower on 05/30/18.   Reason for Disposition . Caller requesting a refill, no triage required, and triager able to refill per unit policy . Nursing judgment  Answer Assessment - Initial Assessment Questions 1. REASON FOR CALL or QUESTION: "What is your reason for calling today?" or "How can I best help you?" or "What question do you have that I can help answer?"    she says that it [facility] but [facility]; therefore she [facility] [address], [place]; she explains that this is the original prescription not a 2nd request 2. CALLER: Document the source of call. (e.g., laboratory, patient).   patient  Protocols used: MEDICATION QUESTION CALL-A-AH, NO GUIDELINE OR REFERENCE AVAILABLE-A-AH, PCP CALL - NO TRIAGE-A-AH

## 2018-06-20 ENCOUNTER — Encounter (HOSPITAL_COMMUNITY): Payer: Medicare HMO

## 2018-06-20 ENCOUNTER — Ambulatory Visit: Payer: Medicare HMO | Admitting: Surgery

## 2018-07-14 ENCOUNTER — Other Ambulatory Visit: Payer: Self-pay | Admitting: Internal Medicine

## 2018-07-21 ENCOUNTER — Other Ambulatory Visit: Payer: Self-pay

## 2018-07-21 MED ORDER — PANTOPRAZOLE SODIUM 40 MG PO TBEC: 40.0000 mg | DELAYED_RELEASE_TABLET | Freq: Every day | ORAL | 1 refills | Status: DC

## 2018-08-07 ENCOUNTER — Other Ambulatory Visit: Payer: Self-pay | Admitting: Internal Medicine

## 2018-08-16 ENCOUNTER — Encounter: Payer: Self-pay | Admitting: Internal Medicine

## 2018-08-16 DIAGNOSIS — H04123 Dry eye syndrome of bilateral lacrimal glands: Secondary | ICD-10-CM | POA: Diagnosis not present

## 2018-08-16 DIAGNOSIS — Z961 Presence of intraocular lens: Secondary | ICD-10-CM | POA: Diagnosis not present

## 2018-08-16 DIAGNOSIS — H10413 Chronic giant papillary conjunctivitis, bilateral: Secondary | ICD-10-CM | POA: Diagnosis not present

## 2018-08-16 DIAGNOSIS — H43813 Vitreous degeneration, bilateral: Secondary | ICD-10-CM | POA: Diagnosis not present

## 2018-08-16 DIAGNOSIS — E119 Type 2 diabetes mellitus without complications: Secondary | ICD-10-CM | POA: Diagnosis not present

## 2018-08-16 LAB — HM DIABETES EYE EXAM

## 2018-08-17 ENCOUNTER — Other Ambulatory Visit: Payer: Self-pay | Admitting: Internal Medicine

## 2018-08-17 MED ORDER — SOLIFENACIN SUCCINATE 5 MG PO TABS: 5.0000 mg | ORAL_TABLET | Freq: Every day | ORAL | 1 refills | Status: DC

## 2018-08-17 NOTE — Telephone Encounter (Signed)
It looks like vesicare was sent to [facility] recently - is she taking that?

## 2018-08-17 NOTE — Telephone Encounter (Signed)
Copied from [place] [id number]. Topic: Quick Communication - Rx Refill/Question >> Aug 17, 2018 11:52 AM Burchel, Abbi R wrote: Medication: solifenacin (VESICARE) 5 MG tablet  [id number] [place], [place] - [place]. AT [address]. [place] [address] Phone: [phone] Fax: [phone]    Pt was advised that RX refills may take up to 3 business days. We ask that you follow-up with your pharmacy.

## 2018-08-17 NOTE — Telephone Encounter (Signed)
Pt changing pharmacies. Remainder of prescription sent to requested pharmacy. Requested Prescriptions  Pending Prescriptions Disp Refills  . solifenacin (VESICARE) 5 MG tablet 90 tablet 1    Sig: Take 1 tablet (5 mg total) by mouth daily.     Urology:  Bladder Agents Passed - 08/17/2018 12:01 PM      Passed - Valid encounter within last 12 months    Recent Outpatient Visits          2 months ago Mild intermittent asthma with exacerbation   [place] John, James W, MD   4 months ago Asthmatic bronchitis without complication, unspecified asthma severity, unspecified whether persistent   [place] John, James W, MD   6 months ago Acute bilateral deep vein thrombosis (DVT) of femoral veins [facility])   [place] John, James W, MD   7 months ago Encounter for well adult exam with abnormal findings   [place] John, Hunt Oris, MD   8 months ago Cough   [facility] -Georges Mouse, MD      Future Appointments            In 3 months Jenny Reichmann, Hunt Oris, MD [place], [facility]

## 2018-08-19 ENCOUNTER — Other Ambulatory Visit: Payer: Self-pay | Admitting: Internal Medicine

## 2018-08-19 MED ORDER — TRIAMCINOLONE ACETONIDE 0.1 % EX CREA: 1.0000 "application " | TOPICAL_CREAM | Freq: Two times a day (BID) | CUTANEOUS | 0 refills | Status: DC

## 2018-08-19 NOTE — Telephone Encounter (Signed)
Copied from [place] [id number]. Topic: Quick Communication - Rx Refill/Question >> Aug 19, 2018  3:38 PM Reyne Dumas L wrote: Medication: triamcinolone cream (KENALOG) 0.1 %  Has the patient contacted their pharmacy? Yes - states they need a script from [place] (Agent: If no, request that the patient contact the pharmacy for the refill.) (Agent: If yes, when and what did the pharmacy advise?)  Preferred Pharmacy (with phone number or street name): [id number] [place], [place]. AT [address] [phone] (Phone) [phone] (Fax)  Agent: Please be advised that RX refills may take up to 3 business days. We ask that you follow-up with your pharmacy.

## 2018-08-22 ENCOUNTER — Ambulatory Visit: Payer: Medicare HMO | Admitting: Surgery

## 2018-08-22 ENCOUNTER — Ambulatory Visit (HOSPITAL_COMMUNITY)
Admission: RE | Admit: 2018-08-22 | Discharge: 2018-08-22 | Disposition: A | Discharge status: Home / Self Care | Payer: Medicare HMO | Source: Ambulatory Visit | Attending: Surgery | Admitting: Surgery

## 2018-08-22 ENCOUNTER — Encounter: Payer: Self-pay | Admitting: Surgery

## 2018-08-22 ENCOUNTER — Other Ambulatory Visit: Payer: Self-pay

## 2018-08-22 VITALS — BP 137/74 | HR 70 | Temp 97.9°F | Resp 16 | Ht 63.0 in | Wt 152.0 lb

## 2018-08-22 DIAGNOSIS — I824Z3 Acute embolism and thrombosis of unspecified deep veins of distal lower extremity, bilateral: Secondary | ICD-10-CM | POA: Diagnosis not present

## 2018-08-22 DIAGNOSIS — M79605 Pain in left leg: Secondary | ICD-10-CM | POA: Insufficient documentation

## 2018-08-22 NOTE — Progress Notes (Signed)
Vascular and Vein Specialist of [facility]  Patient name: [name] MRN: [id number] DOB: 1935-10-29 Sex: female   REASON FOR VISIT:    Follow up  HISOTRY OF PRESENT ILLNESS:    [name] is a 82 y.o. female, who is referred today for evaluation of a DVT.  The patient underwent back surgery at the end of February 2019.  She then presented on 321 with leg swelling and pain and was found to have a left posterior tibial and peroneal vein DVT.  She was started on Eliquis.  Several days later she had the study repeated after a fall and thrombus was noted on the right tibial vein chronicity was on known.  She has been wearing compression stockings and taking her anticoagulation.  She is having trouble with her swelling.  She suffers from chronic renal insufficiency, stage III.  She has a history of breast cancer.  She is medically managed for hypertension, asthma, and reflux disease.  PAST MEDICAL HISTORY:   Past Medical History:  Diagnosis Date  . Allergic rhinitis 10/30/2016  . Anemia   . Asthma   . Breast cancer of upper-outer quadrant of left female breast ([place]) 11/08/2013   ER/PR+ Her2- Left IDC   . Chronic renal insufficiency   . Chronic rhinitis   . Colon polyp   . Diastolic dysfunction 8/88/2800  . DJD (degenerative joint disease)   . Dyspnea   . Full dentures   . GERD (gastroesophageal reflux disease)   . Hearing loss   . Hypertension   . Hyponatremia   . Impaired glucose tolerance 07/18/2014  . Memory loss   . Morbid obesity ([place])   . Poor circulation   . Vertigo   . Wears glasses      FAMILY HISTORY:   Family History  Problem Relation Age of Onset  . Colon cancer Mother        in her 29's  . Stomach cancer Mother   . Heart disease Unknown        remote family  . Lung cancer Brother        was a smoker  . Heart attack Son 46    SOCIAL HISTORY:   Social History   Tobacco Use  . Smoking status:  Never Smoker  . Smokeless tobacco: Never Used  Substance Use Topics  . Alcohol use: No    Alcohol/week: 0.0 standard drinks     ALLERGIES:   Allergies  Allergen Reactions  . Hydrocodone Itching  . Tizanidine Other (See Comments)    Dizzy and fall  . Iron Hives and Other (See Comments)    Whelps, bad constipation     CURRENT MEDICATIONS:   Current Outpatient Medications  Medication Sig Dispense Refill  . albuterol (PROVENTIL HFA;VENTOLIN HFA) 108 (90 Base) MCG/ACT inhaler Inhale 2 puffs into the lungs every 6 (six) hours as needed for wheezing or shortness of breath. 1 Inhaler 11  . amLODipine (NORVASC) 5 MG tablet Take 1 tablet (5 mg total) by mouth daily. 90 tablet 1  . anastrozole (ARIMIDEX) 1 MG tablet Take 1 tablet (1 mg total) by mouth daily. 90 tablet 4  . aspirin 81 MG tablet Take 81 mg by mouth daily.      [name] azithromycin (ZITHROMAX) 250 MG tablet 2 tab by mouth day 1, then 1 per day 6 tablet 1  . ELIQUIS 5 MG TABS tablet TAKE 1 TABLET BY MOUTH TWICE A DAY 60 tablet 0  . erythromycin ophthalmic  ointment Place 1 application into the left eye 4 (four) times daily. For 10 days 3.5 g 0  . furosemide (LASIX) 20 MG tablet Take 1 tablet (20 mg total) by mouth daily. 90 tablet 1  . gabapentin (NEURONTIN) 100 MG capsule Take 1 capsule (100 mg total) by mouth 3 (three) times daily. 270 capsule 0  . guaiFENesin-codeine (CHERATUSSIN AC) 100-10 MG/5ML syrup Take 5 mLs by mouth 4 (four) times daily as needed for cough. 180 mL 0  . HYDROcodone-acetaminophen (NORCO/VICODIN) 5-325 MG tablet   0  . iron polysaccharides (NU-IRON) 150 MG capsule Take 1 capsule (150 mg total) by mouth 2 (two) times daily. 60 capsule 5  . losartan-hydrochlorothiazide (HYZAAR) 100-25 MG tablet Take 1 tablet by mouth daily. 90 tablet 1  . meclizine (ANTIVERT) 12.5 MG tablet TAKE 1 TABLET THREE TIMES DAILY AS NEEDED  FOR  DIZZINESS (Patient taking differently: TAKE 12.5 MG THREE TIMES DAILY AS NEEDED  FOR   DIZZINESS) 270 tablet 0  . methocarbamol (ROBAXIN) 500 MG tablet Take 1 tablet (500 mg total) by mouth every 8 (eight) hours as needed for muscle spasms. 10 tablet 0  . mometasone-formoterol (DULERA) 200-5 MCG/ACT AERO Inhale 2 puffs into the lungs 2 (two) times daily. 13 g 11  . montelukast (SINGULAIR) 10 MG tablet Take 1 tablet (10 mg total) by mouth daily. 90 tablet 3  . Multiple Vitamins-Minerals (CENTRUM SILVER PO) Take 1 tablet by mouth daily.      . pantoprazole (PROTONIX) 40 MG tablet Take 1 tablet (40 mg total) by mouth daily. 90 tablet 1  . Potassium Gluconate 550 (90 K) MG TABS Take 550 mg by mouth daily.     . predniSONE (DELTASONE) 10 MG tablet 3 tabs by mouth per day for 3 days,2tabs per day for 3 days,1tab per day for 3 days 18 tablet 0  . Simethicone (GAS-X PO) Take 1 tablet by mouth daily as needed (for gas).     . solifenacin (VESICARE) 5 MG tablet Take 1 tablet (5 mg total) by mouth daily. 90 tablet 1  . tizanidine (ZANAFLEX) 2 MG capsule   1  . traMADol (ULTRAM) 50 MG tablet Take 1 tablet (50 mg total) by mouth every 6 (six) hours as needed. 60 tablet 1  . triamcinolone (NASACORT AQ) 55 MCG/ACT AERO nasal inhaler Place 2 sprays into the nose daily. 1 Inhaler 12  . triamcinolone cream (KENALOG) 0.1 % Apply 1 application topically 2 (two) times daily. 30 g 0  . Vitamin D, Ergocalciferol, (DRISDOL) 50000 units CAPS capsule Take 1 capsule (50,000 Units total) by mouth every 7 (seven) days. (Patient taking differently: Take 50,000 Units by mouth every 7 (seven) days. On Friday) 12 capsule 0  . oxyCODONE-acetaminophen (PERCOCET/ROXICET) 5-325 MG tablet   0   No current facility-administered medications for this visit.     REVIEW OF SYSTEMS:   '[X]'$  denotes positive finding, '[ ]'$  denotes negative finding Cardiac  Comments:  Chest pain or chest pressure:    Shortness of breath upon exertion:    Short of breath when lying flat:    Irregular heart rhythm:        Vascular    Pain  in calf, thigh, or hip brought on by ambulation:    Pain in feet at night that wakes you up from your sleep:     Blood clot in your veins:    Leg swelling:  x       Pulmonary    Oxygen  at home:    Productive cough:     Wheezing:         Neurologic    Sudden weakness in arms or legs:     Sudden numbness in arms or legs:     Sudden onset of difficulty speaking or slurred speech:    Temporary loss of vision in one eye:     Problems with dizziness:         Gastrointestinal    Blood in stool:     Vomited blood:         Genitourinary    Burning when urinating:     Blood in urine:        Psychiatric    Major depression:         Hematologic    Bleeding problems:    Problems with blood clotting too easily:        Skin    Rashes or ulcers:        Constitutional    Fever or chills:      PHYSICAL EXAM:   Vitals:   08/22/18 1613  BP: 137/74  Pulse: 70  Resp: 16  Temp: 97.9 F (36.6 C)  TempSrc: Oral  SpO2: 100%  Weight: 152 lb (68.9 kg)  Height: '5\' 3"'$  (1.6 m)    GENERAL: The patient is a well-nourished female, in no acute distress. The vital signs are documented above. CARDIAC: There is a regular rate and rhythm.  VASCULAR: Bilateral pitting edema PULMONARY: Non-labored respirations MUSCULOSKELETAL: There are no major deformities or cyanosis. NEUROLOGIC: No focal weakness or paresthesias are detected. SKIN: There are no ulcers or rashes noted. PSYCHIATRIC: The patient has a normal affect.  STUDIES:   I have ordered and reviewed the vascular lab studies with the following findings:  Venous Reflux Times Normal value < 0.5 sec +------------------------------+----------+---------+                Right (ms)Left (ms) +------------------------------+----------+---------+ CFV              1394.00  3990.00  +------------------------------+----------+---------+ GSV at Saphenofemoral junction1210.00  2721.00   +------------------------------+----------+---------+ GSV prox thigh        1768.00  1064.00  +------------------------------+----------+---------+ GSV mid thigh         1775.00  2281.00  +------------------------------+----------+---------+ GSV dist thigh        2699.00  2963.00  +------------------------------+----------+---------+ GSV at knee          1298.00  2809.00  +------------------------------+----------+---------+ GSV prox calf         3147.00  1746.00  +------------------------------+----------+---------+ GSV mid calf         601.00        +------------------------------+----------+---------+ SSV origin          1650.00        +------------------------------+----------+---------+  Vein Diameters: +------------------------------+----------+---------+                Right (cm)Left (cm) +------------------------------+----------+---------+ GSV at Saphenofemoral junction0.563   0.52    +------------------------------+----------+---------+ GSV at prox thigh       0.573   0.502   +------------------------------+----------+---------+ GSV at mid thigh       0.301   0.353   +------------------------------+----------+---------+ GSV at distal thigh      0.376   0.354   +------------------------------+----------+---------+ GSV at knee          0.324   0.284   +------------------------------+----------+---------+ GSV prox calf         0.300  0.312   +------------------------------+----------+---------+ GSV mid calf         0.225         +------------------------------+----------+---------+ SSV origin          0.336   0.174   +------------------------------+----------+---------+ SSV prox           0.212          +------------------------------+----------+---------+   Left Reflux Technical Findings: Left mid calf GSV not visualized due to tortuosity and varicosities.  Summary: Right: Abnormal reflux times were noted in the common femoral vein, great saphenous vein at the saphenofemoral junction, great saphenous vein at the proximal thigh, great saphenous vein at the mid thigh, great saphenous vein at the distal thigh, great  saphenous vein at the knee, great saphenous vein at the prox calf, great saphenous vein at the mid calf, and origin of the small saphenous vein. There is no evidence of deep vein thrombosis in the lower extremity within the CFV, FV and POPV.There is no  evidence of superficial venous thrombosis within the visualized GSV and SSV. Left: Abnormal reflux times were noted in the common femoral vein, great saphenous vein at the saphenofemoral junction, great saphenous vein at the proximal thigh, great saphenous vein at the mid thigh, great saphenous vein at the distal thigh, great  saphenous vein at the knee, and great saphenous vein at the proximal calf. There is no evidence of deep vein thrombosis in the lower extremity within the CFV, FV, and POPV.There is no evidence of superficial venous thrombosis within the visualized GSV  and SSV.[name]  MEDICAL ISSUES:   Chronic lower extremity edema: By ultrasound today, the patient has significant bilateral lower extremity reflux.  We discussed the possibility of a new laser venous ablation, however I am not convinced that this will completely alleviate her symptoms.  I think she would probably be best treated with wearing compression stockings.  If the stockings fail to completely alleviate her symptoms she knows that she can return and we can have further discussions regarding laser ablation.  The patient has been on anticoagulation for tibial vein DVT, following surgery for more than 6 months.  From my perspective this can be discontinued.  The  patient does report a family history of a clotting disorder so it would probably be beneficial to have her evaluated by hematology when she has come off her anticoagulation.    Annamarie Major, MD Vascular and Vein Specialists of [facility] [phone] Pager [phone]

## 2018-08-23 ENCOUNTER — Telehealth: Payer: Self-pay | Admitting: Internal Medicine

## 2018-08-23 NOTE — Telephone Encounter (Signed)
Copied from [place] [phone]. Topic: General - Other >> Aug 23, 2018  1:50 PM [name] wrote: Reason for CRM: Pt called in she wanted to advise Dr. Jenny Reichmann that her Vein and Vascular specialist wants her to discontinue taking the medication Eliquis 5mg .   Also pt says the pharmacy is requesting an alt to the medication solifenacin (VESICARE) 5 MG tablet , she says the insurance is not going to cover.   Please advise

## 2018-08-24 ENCOUNTER — Telehealth: Payer: Self-pay

## 2018-08-24 NOTE — Telephone Encounter (Signed)
She tried anything else for the bladder?  Is she looking for approval for discontinuing the Eliquis or just letting Dr. Jenny Reichmann know?

## 2018-08-24 NOTE — Telephone Encounter (Signed)
I spoke to the pharmacist for clarification. They stated they were out of Vesicare but the order comes in tomorrow. No PA or alternative needed. I have informed pt.  Pt stated that she was unsure if she should stop the Eliquis or not since the vein specialist wanted her to discontinue it. Do you agree she should stop or should I let her know to continue taking until PCP is back in office? Please advise.

## 2018-08-24 NOTE — Telephone Encounter (Signed)
Since I do not know her history I would recommend she discuss this with Dr. Jenny Reichmann.

## 2018-08-24 NOTE — Telephone Encounter (Signed)
Pt has been informed to continue taking Eliquis until PCP is back in office on Monday. Pt will be contacted at that time with a final decision.

## 2018-08-24 NOTE — Telephone Encounter (Signed)
Would this patient benefit from PT in your medical opinion? How should I proceed? Please advise.    Copied from [place] [phone]. Topic: Quick Communication - See Telephone Encounter >> Aug 23, 2018  3:19 PM Antonieta Iba C wrote: CRM for notification. See Telephone encounter for: 08/23/18.  [employer] is calling in on pt's behalf. Pt is requesting to have home health services for PT.   Please assist

## 2018-08-25 NOTE — Telephone Encounter (Signed)
I think the rules for medicare related insurance require an OV to document the need for this.  Thanks

## 2018-08-25 NOTE — Telephone Encounter (Signed)
Patient scheduled.

## 2018-08-25 NOTE — Telephone Encounter (Signed)
Pt has been informed and expressed understanding.  

## 2018-08-25 NOTE — Telephone Encounter (Signed)
Reviewed chart: and I agree with vascular that:  The patient has been on anticoagulation for tibial vein DVT, following surgery for more than 6 months.  From my perspective this can be discontinued  OK to stop the eliquis now, although if the DVT occurs again, we would normally recommend to stay on this

## 2018-08-30 ENCOUNTER — Encounter: Payer: Self-pay | Admitting: Internal Medicine

## 2018-08-30 ENCOUNTER — Other Ambulatory Visit (INDEPENDENT_AMBULATORY_CARE_PROVIDER_SITE_OTHER): Payer: Medicare HMO

## 2018-08-30 ENCOUNTER — Ambulatory Visit (INDEPENDENT_AMBULATORY_CARE_PROVIDER_SITE_OTHER): Payer: Medicare HMO | Admitting: Internal Medicine

## 2018-08-30 VITALS — BP 162/84 | HR 87 | Temp 97.7°F | Ht 63.0 in | Wt 155.0 lb

## 2018-08-30 DIAGNOSIS — H9193 Unspecified hearing loss, bilateral: Secondary | ICD-10-CM | POA: Diagnosis not present

## 2018-08-30 DIAGNOSIS — R269 Unspecified abnormalities of gait and mobility: Secondary | ICD-10-CM | POA: Insufficient documentation

## 2018-08-30 DIAGNOSIS — Z0001 Encounter for general adult medical examination with abnormal findings: Secondary | ICD-10-CM

## 2018-08-30 DIAGNOSIS — Z23 Encounter for immunization: Secondary | ICD-10-CM | POA: Diagnosis not present

## 2018-08-30 DIAGNOSIS — R531 Weakness: Secondary | ICD-10-CM | POA: Insufficient documentation

## 2018-08-30 DIAGNOSIS — R7302 Impaired glucose tolerance (oral): Secondary | ICD-10-CM

## 2018-08-30 LAB — CBC WITH DIFFERENTIAL/PLATELET
Basophils Absolute: 0 10*3/uL (ref 0.0–0.1)
Basophils Relative: 0.7 % (ref 0.0–3.0)
Eosinophils Absolute: 0.2 10*3/uL (ref 0.0–0.7)
Eosinophils Relative: 2.4 % (ref 0.0–5.0)
HCT: 30.1 % — ABNORMAL LOW (ref 36.0–46.0)
Hemoglobin: 10.3 g/dL — ABNORMAL LOW (ref 12.0–15.0)
Lymphocytes Relative: 20.6 % (ref 12.0–46.0)
Lymphs Abs: 1.3 10*3/uL (ref 0.7–4.0)
MCHC: 34.4 g/dL (ref 30.0–36.0)
MCV: 98.6 fl (ref 78.0–100.0)
Monocytes Absolute: 0.7 10*3/uL (ref 0.1–1.0)
Monocytes Relative: 11.2 % (ref 3.0–12.0)
Neutro Abs: 4.3 10*3/uL (ref 1.4–7.7)
Neutrophils Relative %: 65.1 % (ref 43.0–77.0)
Platelets: 321 10*3/uL (ref 150.0–400.0)
RBC: 3.05 Mil/uL — ABNORMAL LOW (ref 3.87–5.11)
RDW: 14.2 % (ref 11.5–15.5)
WBC: 6.5 10*3/uL (ref 4.0–10.5)

## 2018-08-30 LAB — HEPATIC FUNCTION PANEL
ALT: 20 U/L (ref 0–35)
AST: 27 U/L (ref 0–37)
Albumin: 4.3 g/dL (ref 3.5–5.2)
Alkaline Phosphatase: 63 U/L (ref 39–117)
Bilirubin, Direct: 0.2 mg/dL (ref 0.0–0.3)
Total Bilirubin: 0.7 mg/dL (ref 0.2–1.2)
Total Protein: 7.4 g/dL (ref 6.0–8.3)

## 2018-08-30 LAB — URINALYSIS, ROUTINE W REFLEX MICROSCOPIC
Bilirubin Urine: NEGATIVE
Ketones, ur: NEGATIVE
Leukocytes, UA: NEGATIVE
Nitrite: NEGATIVE
Specific Gravity, Urine: <=1.005 — AB (ref 1.000–1.030)
Total Protein, Urine: NEGATIVE
Urine Glucose: NEGATIVE
Urobilinogen, UA: 0.2 (ref 0.0–1.0)
pH: 6.5 (ref 5.0–8.0)

## 2018-08-30 LAB — BASIC METABOLIC PANEL
BUN: 23 mg/dL (ref 6–23)
CO2: 32 mEq/L (ref 19–32)
Calcium: 10.3 mg/dL (ref 8.4–10.5)
Chloride: 88 mEq/L — ABNORMAL LOW (ref 96–112)
Creatinine, Ser: 1.08 mg/dL (ref 0.40–1.20)
GFR: 62.65 mL/min (ref >60.00)
Glucose, Bld: 101 mg/dL — ABNORMAL HIGH (ref 70–99)
Potassium: 3.3 mEq/L — ABNORMAL LOW (ref 3.5–5.1)
Sodium: 129 mEq/L — ABNORMAL LOW (ref 135–145)

## 2018-08-30 LAB — LIPID PANEL
Cholesterol: 173 mg/dL (ref 0–200)
HDL: 69.8 mg/dL (ref >39.00)
LDL Cholesterol: 87 mg/dL (ref 0–99)
NonHDL: 103.34
Total CHOL/HDL Ratio: 2
Triglycerides: 80 mg/dL (ref 0.0–149.0)
VLDL: 16 mg/dL (ref 0.0–40.0)

## 2018-08-30 LAB — HEMOGLOBIN A1C: Hgb A1c MFr Bld: 5.1 % (ref 4.6–6.5)

## 2018-08-30 LAB — TSH: TSH: 2.42 u[IU]/mL (ref 0.35–4.50)

## 2018-08-30 NOTE — Progress Notes (Signed)
Subjective:    Patient ID: [name], female    DOB: [birth year], 82 y.o.   MRN: [id number]  HPI  Here for wellness and f/u;  Overall doing ok;  Pt denies Chest pain, worsening SOB, DOE, wheezing, orthopnea, PND, worsening LE edema, palpitations, dizziness or syncope.  Pt denies neurological change such as new headache, facial or extremity weakness.  Pt denies polydipsia, polyuria, or low sugar symptoms. Pt states overall good compliance with treatment and medications, good tolerability, and has been trying to follow appropriate diet.  Pt denies worsening depressive symptoms, suicidal ideation or panic. No fever, night sweats, wt loss, loss of appetite, or other constitutional symptoms.     Pt states fair but worsening ability with ADL's such as even ambulating well enough and generalized weakness, has low to mod fall risk, home safety reviewed and adequate, no other significant changes in hearing or vision, and only occasionally active with exercise. No longer drives, transportation is an issue as family all working, and friend who drives her sometime has cancer now.  Walks with cane.  No recent falls. Past Medical History:  Diagnosis Date  . Allergic rhinitis 10/30/2016  . Anemia   . Asthma   . Breast cancer of upper-outer quadrant of left female breast ([place]) 11/08/2013   ER/PR+ Her2- Left IDC   . Chronic renal insufficiency   . Chronic rhinitis   . Colon polyp   . Diastolic dysfunction 2/68/3419  . DJD (degenerative joint disease)   . Dyspnea   . Full dentures   . GERD (gastroesophageal reflux disease)   . Hearing loss   . Hypertension   . Hyponatremia   . Impaired glucose tolerance 07/18/2014  . Memory loss   . Morbid obesity ([place])   . Poor circulation   . Vertigo   . Wears glasses    Past Surgical History:  Procedure Laterality Date  . ABDOMINAL HYSTERECTOMY    . BREAST LUMPECTOMY WITH NEEDLE LOCALIZATION AND AXILLARY SENTINEL LYMPH NODE BX Left 12/04/2013   Procedure:  BREAST LUMPECTOMY WITH NEEDLE LOCALIZATION AND AXILLARY SENTINEL LYMPH NODE BX;  Surgeon: Shann Medal, MD;  Location: [facility];  Service: General;  Laterality: Left;  . CATARACT EXTRACTION  2009   rt  . COLONOSCOPY    . EYE SURGERY Bilateral    cataract surgery  . KNEE ARTHROSCOPY     both  . LUMBAR LAMINECTOMY/DECOMPRESSION MICRODISCECTOMY Left 12/23/2017   Procedure: Left Lumbar One-Two Laminectomy with microdiscectomy;  Surgeon: Eustace Moore, MD;  Location: [facility];  Service: Neurosurgery;  Laterality: Left;  Left L1-2 Laminectomy with microdiscectomy  . TONSILLECTOMY    . TOTAL KNEE ARTHROPLASTY  2002   rt  . TOTAL KNEE ARTHROPLASTY  2003   left  . VESICOVAGINAL FISTULA CLOSURE W/ TAH  1980    reports that she has never smoked. She has never used smokeless tobacco. She reports that she does not drink alcohol or use drugs. family history includes Colon cancer in her mother; Heart attack (age of onset: 40) in her son; Heart disease in her unknown relative; Lung cancer in her brother; Stomach cancer in her mother. Allergies  Allergen Reactions  . Hydrocodone Itching  . Tizanidine Other (See Comments)    Dizzy and fall  . Iron Hives and Other (See Comments)    Whelps, bad constipation   Current Outpatient Medications on File Prior to Visit  Medication Sig Dispense Refill  . albuterol (PROVENTIL HFA;VENTOLIN HFA) 108 (  90 Base) MCG/ACT inhaler Inhale 2 puffs into the lungs every 6 (six) hours as needed for wheezing or shortness of breath. 1 Inhaler 11  . amLODipine (NORVASC) 5 MG tablet Take 1 tablet (5 mg total) by mouth daily. 90 tablet 1  . anastrozole (ARIMIDEX) 1 MG tablet Take 1 tablet (1 mg total) by mouth daily. 90 tablet 4  . aspirin 81 MG tablet Take 81 mg by mouth daily.      [name] ELIQUIS 5 MG TABS tablet TAKE 1 TABLET BY MOUTH TWICE A DAY 60 tablet 0  . erythromycin ophthalmic ointment Place 1 application into the left eye 4 (four) times daily. For 10 days  3.5 g 0  . furosemide (LASIX) 20 MG tablet Take 1 tablet (20 mg total) by mouth daily. 90 tablet 1  . gabapentin (NEURONTIN) 100 MG capsule Take 1 capsule (100 mg total) by mouth 3 (three) times daily. 270 capsule 0  . guaiFENesin-codeine (CHERATUSSIN AC) 100-10 MG/5ML syrup Take 5 mLs by mouth 4 (four) times daily as needed for cough. 180 mL 0  . HYDROcodone-acetaminophen (NORCO/VICODIN) 5-325 MG tablet   0  . iron polysaccharides (NU-IRON) 150 MG capsule Take 1 capsule (150 mg total) by mouth 2 (two) times daily. 60 capsule 5  . losartan-hydrochlorothiazide (HYZAAR) 100-25 MG tablet Take 1 tablet by mouth daily. 90 tablet 1  . meclizine (ANTIVERT) 12.5 MG tablet TAKE 1 TABLET THREE TIMES DAILY AS NEEDED  FOR  DIZZINESS (Patient taking differently: TAKE 12.5 MG THREE TIMES DAILY AS NEEDED  FOR  DIZZINESS) 270 tablet 0  . methocarbamol (ROBAXIN) 500 MG tablet Take 1 tablet (500 mg total) by mouth every 8 (eight) hours as needed for muscle spasms. 10 tablet 0  . mometasone-formoterol (DULERA) 200-5 MCG/ACT AERO Inhale 2 puffs into the lungs 2 (two) times daily. 13 g 11  . montelukast (SINGULAIR) 10 MG tablet Take 1 tablet (10 mg total) by mouth daily. 90 tablet 3  . Multiple Vitamins-Minerals (CENTRUM SILVER PO) Take 1 tablet by mouth daily.      [name] oxyCODONE-acetaminophen (PERCOCET/ROXICET) 5-325 MG tablet   0  . pantoprazole (PROTONIX) 40 MG tablet Take 1 tablet (40 mg total) by mouth daily. 90 tablet 1  . Potassium Gluconate 550 (90 K) MG TABS Take 550 mg by mouth daily.     . Simethicone (GAS-X PO) Take 1 tablet by mouth daily as needed (for gas).     . solifenacin (VESICARE) 5 MG tablet Take 1 tablet (5 mg total) by mouth daily. 90 tablet 1  . tizanidine (ZANAFLEX) 2 MG capsule   1  . traMADol (ULTRAM) 50 MG tablet Take 1 tablet (50 mg total) by mouth every 6 (six) hours as needed. 60 tablet 1  . triamcinolone (NASACORT AQ) 55 MCG/ACT AERO nasal inhaler Place 2 sprays into the nose daily. 1  Inhaler 12  . triamcinolone cream (KENALOG) 0.1 % Apply 1 application topically 2 (two) times daily. 30 g 0  . Vitamin D, Ergocalciferol, (DRISDOL) 50000 units CAPS capsule Take 1 capsule (50,000 Units total) by mouth every 7 (seven) days. (Patient taking differently: Take 50,000 Units by mouth every 7 (seven) days. On Friday) 12 capsule 0   No current facility-administered medications on file prior to visit.    Review of Systems Constitutional: Negative for other unusual diaphoresis, sweats, appetite or weight changes HENT: Negative for other worsening hearing loss, ear pain, facial swelling, mouth sores or neck stiffness.   Eyes: Negative for other  worsening pain, redness or other visual disturbance.  Respiratory: Negative for other stridor or swelling Cardiovascular: Negative for other palpitations or other chest pain  Gastrointestinal: Negative for worsening diarrhea or loose stools, blood in stool, distention or other pain Genitourinary: Negative for hematuria, flank pain or other change in urine volume.  Musculoskeletal: Negative for myalgias or other joint swelling.  Skin: Negative for other color change, or other wound or worsening drainage.  Neurological: Negative for other syncope or numbness. Hematological: Negative for other adenopathy or swelling Psychiatric/Behavioral: Negative for hallucinations, other worsening agitation, SI, self-injury, or new decreased concentration All other system neg per pt    Objective:   Physical Exam BP (!) 162/84   Pulse 87   Temp 97.7 F (36.5 C) (Oral)   Ht _0  (1.6 m)   Wt 155 lb (70.3 kg)   SpO2 98%   BMI 27.46 kg/m   VS noted, general weak, walks with walker Constitutional: Pt is oriented to person, place, and time. Appears well-developed and well-nourished, in no significant distress and comfortable Head: Normocephalic and atraumatic  Eyes: Conjunctivae and EOM are normal. Pupils are equal, round, and reactive to light Right Ear:  External ear normal without discharge Left Ear: External ear normal without discharge Nose: Nose without discharge or deformity Mouth/Throat: Oropharynx is without other ulcerations and moist  Neck: Normal range of motion. Neck supple. No JVD present. No tracheal deviation present or significant neck LA or mass Cardiovascular: Normal rate, regular rhythm, normal heart sounds and intact distal pulses.   Pulmonary/Chest: WOB normal and breath sounds without rales or wheezing  Abdominal: Soft. Bowel sounds are normal. NT. No HSM  Musculoskeletal: Normal range of motion. Exhibits no edema Lymphadenopathy: Has no other cervical adenopathy.  Neurological: Pt is alert and oriented to person, place, and time. Pt has normal reflexes. No cranial nerve deficit. Motor grossly intact, Gait intact Skin: Skin is warm and dry. No rash noted or new ulcerations Psychiatric:  Has normal mood and affect. Behavior is normal without agitation No other exam findings Lab Results  Component Value Date   WBC 6.5 08/30/2018   HGB 10.3 (L) 08/30/2018   HCT 30.1 (L) 08/30/2018   PLT 321.0 08/30/2018   GLUCOSE 101 (H) 08/30/2018   CHOL 173 08/30/2018   TRIG 80.0 08/30/2018   HDL 69.80 08/30/2018   LDLCALC 87 08/30/2018   ALT 20 08/30/2018   AST 27 08/30/2018   NA 129 (L) 08/30/2018   K 3.3 (L) 08/30/2018   CL 88 (L) 08/30/2018   CREATININE 1.08 08/30/2018   BUN 23 08/30/2018   CO2 32 08/30/2018   TSH 2.42 08/30/2018   INR 1.35 01/17/2018   HGBA1C 5.1 08/30/2018       Assessment & Plan:

## 2018-08-30 NOTE — Patient Instructions (Addendum)
You had the flu shot today  Please continue all other medications as before, and refills have been done if requested.  Please have the pharmacy call with any other refills you may need.  Please continue your efforts at being more active, low cholesterol diet, and weight control.  You are otherwise up to date with prevention measures today.  Please keep your appointments with your specialists as you may have planned  You will be contacted regarding the referral for: [place] with RN, PT, and aide  Please go to the LAB in the Basement (turn left off the elevator) for the tests to be done today  You will be contacted by phone if any changes need to be made immediately.  Otherwise, you will receive a letter about your results with an explanation, but please check with MyChart first.  Please remember to sign up for MyChart if you have not done so, as this will be important to you in the future with finding out test results, communicating by private email, and scheduling acute appointments online when needed.  Please return in 6 months, or sooner if needed

## 2018-08-30 NOTE — Assessment & Plan Note (Signed)
Has done audiology referral, has f/u Nov 13 and plans to work on getting hearing aids

## 2018-09-01 ENCOUNTER — Telehealth: Payer: Self-pay | Admitting: Internal Medicine

## 2018-09-01 DIAGNOSIS — N189 Chronic kidney disease, unspecified: Secondary | ICD-10-CM | POA: Diagnosis not present

## 2018-09-01 DIAGNOSIS — I130 Hypertensive heart and chronic kidney disease with heart failure and stage 1 through stage 4 chronic kidney disease, or unspecified chronic kidney disease: Secondary | ICD-10-CM | POA: Diagnosis not present

## 2018-09-01 DIAGNOSIS — I5030 Unspecified diastolic (congestive) heart failure: Secondary | ICD-10-CM | POA: Diagnosis not present

## 2018-09-01 DIAGNOSIS — R7302 Impaired glucose tolerance (oral): Secondary | ICD-10-CM | POA: Diagnosis not present

## 2018-09-01 DIAGNOSIS — M150 Primary generalized (osteo)arthritis: Secondary | ICD-10-CM | POA: Diagnosis not present

## 2018-09-01 DIAGNOSIS — R2689 Other abnormalities of gait and mobility: Secondary | ICD-10-CM | POA: Diagnosis not present

## 2018-09-01 DIAGNOSIS — C50412 Malignant neoplasm of upper-outer quadrant of left female breast: Secondary | ICD-10-CM | POA: Diagnosis not present

## 2018-09-01 DIAGNOSIS — R531 Weakness: Secondary | ICD-10-CM | POA: Diagnosis not present

## 2018-09-01 NOTE — Telephone Encounter (Signed)
Nurse with home health called stating the patient has a burning pain in her leg. The pain is a 8/10 when she is walking. She is taking 3 Gabapentin a day.  The nurse needs orders or OT, PT, nurse aid and social worker.   Please call laura back at [phone].

## 2018-09-01 NOTE — Telephone Encounter (Signed)
Ok for verbals 

## 2018-09-01 NOTE — Telephone Encounter (Signed)
Copied from [place] [phone]. Topic: Quick Communication - Home Health Verbal Orders >> Sep 01, 2018  3:22 PM Leward Quan A wrote: Caller/Agency: [facility]  Mickel Baas) Callback Number: [phone]  [place] to leave VM Requesting OT/PT/Skilled Nursing/Social Work: Nursins, PT,OT Aide and Social worker  Frequency: Nur. 1x wk for 9 weeks, Aide 2xs wk for 3 weeks, PT/OT 1 x wk 1 wk, social work 1x month 1

## 2018-09-02 NOTE — Assessment & Plan Note (Signed)
For [facility] with RN and aide

## 2018-09-02 NOTE — Assessment & Plan Note (Addendum)
For [facility] with PT, and aide  In addition to the time spent performing CPE, I spent an additional 25 minutes face to face,in which greater than 50% of this time was spent in counseling and coordination of care for patient's acute illness as documented, including the differential dx, treatment, further evaluation and other management of gait disorder, general weakness, hyperglycemia, and hearing loss

## 2018-09-02 NOTE — Telephone Encounter (Signed)
Verbal orders given  

## 2018-09-02 NOTE — Assessment & Plan Note (Signed)
stable overall by history and exam, recent data reviewed with pt, and pt to continue medical treatment as before,  to f/u any worsening symptoms or concerns  

## 2018-09-02 NOTE — Assessment & Plan Note (Signed)

## 2018-09-05 DIAGNOSIS — R2689 Other abnormalities of gait and mobility: Secondary | ICD-10-CM | POA: Diagnosis not present

## 2018-09-05 DIAGNOSIS — N189 Chronic kidney disease, unspecified: Secondary | ICD-10-CM | POA: Diagnosis not present

## 2018-09-05 DIAGNOSIS — R7302 Impaired glucose tolerance (oral): Secondary | ICD-10-CM | POA: Diagnosis not present

## 2018-09-05 DIAGNOSIS — I5030 Unspecified diastolic (congestive) heart failure: Secondary | ICD-10-CM | POA: Diagnosis not present

## 2018-09-05 DIAGNOSIS — C50412 Malignant neoplasm of upper-outer quadrant of left female breast: Secondary | ICD-10-CM | POA: Diagnosis not present

## 2018-09-05 DIAGNOSIS — M150 Primary generalized (osteo)arthritis: Secondary | ICD-10-CM | POA: Diagnosis not present

## 2018-09-05 DIAGNOSIS — I130 Hypertensive heart and chronic kidney disease with heart failure and stage 1 through stage 4 chronic kidney disease, or unspecified chronic kidney disease: Secondary | ICD-10-CM | POA: Diagnosis not present

## 2018-09-05 DIAGNOSIS — R531 Weakness: Secondary | ICD-10-CM | POA: Diagnosis not present

## 2018-09-06 ENCOUNTER — Telehealth: Payer: Self-pay | Admitting: Internal Medicine

## 2018-09-06 DIAGNOSIS — M150 Primary generalized (osteo)arthritis: Secondary | ICD-10-CM | POA: Diagnosis not present

## 2018-09-06 DIAGNOSIS — C50412 Malignant neoplasm of upper-outer quadrant of left female breast: Secondary | ICD-10-CM | POA: Diagnosis not present

## 2018-09-06 DIAGNOSIS — I130 Hypertensive heart and chronic kidney disease with heart failure and stage 1 through stage 4 chronic kidney disease, or unspecified chronic kidney disease: Secondary | ICD-10-CM | POA: Diagnosis not present

## 2018-09-06 DIAGNOSIS — R531 Weakness: Secondary | ICD-10-CM | POA: Diagnosis not present

## 2018-09-06 DIAGNOSIS — N189 Chronic kidney disease, unspecified: Secondary | ICD-10-CM | POA: Diagnosis not present

## 2018-09-06 DIAGNOSIS — I5030 Unspecified diastolic (congestive) heart failure: Secondary | ICD-10-CM | POA: Diagnosis not present

## 2018-09-06 DIAGNOSIS — R2689 Other abnormalities of gait and mobility: Secondary | ICD-10-CM | POA: Diagnosis not present

## 2018-09-06 DIAGNOSIS — R7302 Impaired glucose tolerance (oral): Secondary | ICD-10-CM | POA: Diagnosis not present

## 2018-09-06 NOTE — Telephone Encounter (Signed)
Copied from [place] [phone]. Topic: Quick Communication - Home Health Verbal Orders >> Sep 06, 2018  4:06 PM Adelene Idler wrote: Caller/Agency:[facility] [place] Number: [phone] Requesting OT/PT/Skilled Nursing/Social Work: PT Frequency: 2 week 4

## 2018-09-07 DIAGNOSIS — R7302 Impaired glucose tolerance (oral): Secondary | ICD-10-CM | POA: Diagnosis not present

## 2018-09-07 DIAGNOSIS — I130 Hypertensive heart and chronic kidney disease with heart failure and stage 1 through stage 4 chronic kidney disease, or unspecified chronic kidney disease: Secondary | ICD-10-CM | POA: Diagnosis not present

## 2018-09-07 DIAGNOSIS — M150 Primary generalized (osteo)arthritis: Secondary | ICD-10-CM | POA: Diagnosis not present

## 2018-09-07 DIAGNOSIS — C50412 Malignant neoplasm of upper-outer quadrant of left female breast: Secondary | ICD-10-CM | POA: Diagnosis not present

## 2018-09-07 DIAGNOSIS — N189 Chronic kidney disease, unspecified: Secondary | ICD-10-CM | POA: Diagnosis not present

## 2018-09-07 DIAGNOSIS — R531 Weakness: Secondary | ICD-10-CM | POA: Diagnosis not present

## 2018-09-07 DIAGNOSIS — R2689 Other abnormalities of gait and mobility: Secondary | ICD-10-CM | POA: Diagnosis not present

## 2018-09-07 DIAGNOSIS — I5030 Unspecified diastolic (congestive) heart failure: Secondary | ICD-10-CM | POA: Diagnosis not present

## 2018-09-07 NOTE — Telephone Encounter (Signed)
Ok for verbals 

## 2018-09-07 NOTE — Telephone Encounter (Signed)
Called erin no answer LMOM w/MD response../lm,b

## 2018-09-09 DIAGNOSIS — R531 Weakness: Secondary | ICD-10-CM | POA: Diagnosis not present

## 2018-09-09 DIAGNOSIS — N189 Chronic kidney disease, unspecified: Secondary | ICD-10-CM | POA: Diagnosis not present

## 2018-09-09 DIAGNOSIS — R2689 Other abnormalities of gait and mobility: Secondary | ICD-10-CM | POA: Diagnosis not present

## 2018-09-09 DIAGNOSIS — C50412 Malignant neoplasm of upper-outer quadrant of left female breast: Secondary | ICD-10-CM | POA: Diagnosis not present

## 2018-09-09 DIAGNOSIS — I130 Hypertensive heart and chronic kidney disease with heart failure and stage 1 through stage 4 chronic kidney disease, or unspecified chronic kidney disease: Secondary | ICD-10-CM | POA: Diagnosis not present

## 2018-09-09 DIAGNOSIS — R7302 Impaired glucose tolerance (oral): Secondary | ICD-10-CM | POA: Diagnosis not present

## 2018-09-09 DIAGNOSIS — I5030 Unspecified diastolic (congestive) heart failure: Secondary | ICD-10-CM | POA: Diagnosis not present

## 2018-09-09 DIAGNOSIS — M150 Primary generalized (osteo)arthritis: Secondary | ICD-10-CM | POA: Diagnosis not present

## 2018-09-12 ENCOUNTER — Other Ambulatory Visit: Payer: Self-pay | Admitting: Internal Medicine

## 2018-09-12 DIAGNOSIS — M150 Primary generalized (osteo)arthritis: Secondary | ICD-10-CM | POA: Diagnosis not present

## 2018-09-12 DIAGNOSIS — C50412 Malignant neoplasm of upper-outer quadrant of left female breast: Secondary | ICD-10-CM | POA: Diagnosis not present

## 2018-09-12 DIAGNOSIS — I5030 Unspecified diastolic (congestive) heart failure: Secondary | ICD-10-CM | POA: Diagnosis not present

## 2018-09-12 DIAGNOSIS — R531 Weakness: Secondary | ICD-10-CM | POA: Diagnosis not present

## 2018-09-12 DIAGNOSIS — R2689 Other abnormalities of gait and mobility: Secondary | ICD-10-CM | POA: Diagnosis not present

## 2018-09-12 DIAGNOSIS — R7302 Impaired glucose tolerance (oral): Secondary | ICD-10-CM | POA: Diagnosis not present

## 2018-09-12 DIAGNOSIS — I130 Hypertensive heart and chronic kidney disease with heart failure and stage 1 through stage 4 chronic kidney disease, or unspecified chronic kidney disease: Secondary | ICD-10-CM | POA: Diagnosis not present

## 2018-09-12 DIAGNOSIS — N189 Chronic kidney disease, unspecified: Secondary | ICD-10-CM | POA: Diagnosis not present

## 2018-09-12 NOTE — Telephone Encounter (Signed)
Copied from [place] [phone]. Topic: General - Other >> Sep 12, 2018  1:56 PM Carolyn Stare wrote:  Pt said pharmacy has been out of the below med for over a week and is asking if there is something else she can take    losartan-hydrochlorothiazide (HYZAAR) 100-25 MG tablet  Pharmacy [facility]

## 2018-09-13 DIAGNOSIS — I5030 Unspecified diastolic (congestive) heart failure: Secondary | ICD-10-CM | POA: Diagnosis not present

## 2018-09-13 DIAGNOSIS — R7302 Impaired glucose tolerance (oral): Secondary | ICD-10-CM | POA: Diagnosis not present

## 2018-09-13 DIAGNOSIS — R531 Weakness: Secondary | ICD-10-CM | POA: Diagnosis not present

## 2018-09-13 DIAGNOSIS — M150 Primary generalized (osteo)arthritis: Secondary | ICD-10-CM | POA: Diagnosis not present

## 2018-09-13 DIAGNOSIS — C50412 Malignant neoplasm of upper-outer quadrant of left female breast: Secondary | ICD-10-CM | POA: Diagnosis not present

## 2018-09-13 DIAGNOSIS — R2689 Other abnormalities of gait and mobility: Secondary | ICD-10-CM | POA: Diagnosis not present

## 2018-09-13 DIAGNOSIS — N189 Chronic kidney disease, unspecified: Secondary | ICD-10-CM | POA: Diagnosis not present

## 2018-09-13 DIAGNOSIS — I130 Hypertensive heart and chronic kidney disease with heart failure and stage 1 through stage 4 chronic kidney disease, or unspecified chronic kidney disease: Secondary | ICD-10-CM | POA: Diagnosis not present

## 2018-09-13 MED ORDER — LOSARTAN POTASSIUM-HCTZ 100-25 MG PO TABS: 1.0000 | ORAL_TABLET | Freq: Every day | ORAL | 1 refills | Status: DC

## 2018-09-13 NOTE — Telephone Encounter (Signed)
Requested Prescriptions  Pending Prescriptions Disp Refills  . losartan-hydrochlorothiazide (HYZAAR) 100-25 MG tablet 90 tablet 1    Sig: Take 1 tablet by mouth daily.     Cardiovascular: ARB + Diuretic Combos Failed - 09/13/2018  8:54 AM      Failed - K in normal range and within 180 days    Potassium  Date Value Ref Range Status  08/30/2018 3.3 (L) 3.5 - 5.1 mEq/L Final  07/27/2016 4.4 3.5 - 5.1 mEq/L Final         Failed - Na in normal range and within 180 days    Sodium  Date Value Ref Range Status  08/30/2018 129 (L) 135 - 145 mEq/L Final  07/27/2016 138 136 - 145 mEq/L Final         Failed - Last BP in normal range    BP Readings from Last 1 Encounters:  08/30/18 (!) 162/84         Passed - Cr in normal range and within 180 days    Creatinine  Date Value Ref Range Status  07/27/2016 1.4 (H) 0.6 - 1.1 mg/dL Final   Creatinine, Ser  Date Value Ref Range Status  08/30/2018 1.08 0.40 - 1.20 mg/dL Final         Passed - Ca in normal range and within 180 days    Calcium  Date Value Ref Range Status  08/30/2018 10.3 8.4 - 10.5 mg/dL Final  07/27/2016 9.1 8.4 - 10.4 mg/dL Final         Passed - Patient is not pregnant      Passed - Valid encounter within last 6 months    Recent Outpatient Visits          2 weeks ago Encounter for well adult exam with abnormal findings   [place], James W, MD   3 months ago Mild intermittent asthma with exacerbation   [place], James W, MD   5 months ago Asthmatic bronchitis without complication, unspecified asthma severity, unspecified whether persistent   [place], James W, MD   7 months ago Acute bilateral deep vein thrombosis (DVT) of femoral veins [facility])   [facility] -Georges Mouse, MD   8 months ago Encounter for well adult exam with abnormal findings   [facility] -Georges Mouse, MD      Future Appointments            In 2 months Jenny Reichmann, Hunt Oris, MD [place], [facility]

## 2018-09-14 DIAGNOSIS — R7302 Impaired glucose tolerance (oral): Secondary | ICD-10-CM | POA: Diagnosis not present

## 2018-09-14 DIAGNOSIS — I5030 Unspecified diastolic (congestive) heart failure: Secondary | ICD-10-CM | POA: Diagnosis not present

## 2018-09-14 DIAGNOSIS — N189 Chronic kidney disease, unspecified: Secondary | ICD-10-CM | POA: Diagnosis not present

## 2018-09-14 DIAGNOSIS — I130 Hypertensive heart and chronic kidney disease with heart failure and stage 1 through stage 4 chronic kidney disease, or unspecified chronic kidney disease: Secondary | ICD-10-CM | POA: Diagnosis not present

## 2018-09-14 DIAGNOSIS — C50412 Malignant neoplasm of upper-outer quadrant of left female breast: Secondary | ICD-10-CM | POA: Diagnosis not present

## 2018-09-14 DIAGNOSIS — R2689 Other abnormalities of gait and mobility: Secondary | ICD-10-CM | POA: Diagnosis not present

## 2018-09-14 DIAGNOSIS — M150 Primary generalized (osteo)arthritis: Secondary | ICD-10-CM | POA: Diagnosis not present

## 2018-09-14 DIAGNOSIS — R531 Weakness: Secondary | ICD-10-CM | POA: Diagnosis not present

## 2018-09-16 DIAGNOSIS — M150 Primary generalized (osteo)arthritis: Secondary | ICD-10-CM | POA: Diagnosis not present

## 2018-09-16 DIAGNOSIS — R7302 Impaired glucose tolerance (oral): Secondary | ICD-10-CM | POA: Diagnosis not present

## 2018-09-16 DIAGNOSIS — I5030 Unspecified diastolic (congestive) heart failure: Secondary | ICD-10-CM | POA: Diagnosis not present

## 2018-09-16 DIAGNOSIS — I130 Hypertensive heart and chronic kidney disease with heart failure and stage 1 through stage 4 chronic kidney disease, or unspecified chronic kidney disease: Secondary | ICD-10-CM | POA: Diagnosis not present

## 2018-09-16 DIAGNOSIS — R2689 Other abnormalities of gait and mobility: Secondary | ICD-10-CM | POA: Diagnosis not present

## 2018-09-16 DIAGNOSIS — R531 Weakness: Secondary | ICD-10-CM | POA: Diagnosis not present

## 2018-09-16 DIAGNOSIS — C50412 Malignant neoplasm of upper-outer quadrant of left female breast: Secondary | ICD-10-CM | POA: Diagnosis not present

## 2018-09-16 DIAGNOSIS — N189 Chronic kidney disease, unspecified: Secondary | ICD-10-CM | POA: Diagnosis not present

## 2018-09-18 ENCOUNTER — Other Ambulatory Visit: Payer: Self-pay | Admitting: Internal Medicine

## 2018-09-19 DIAGNOSIS — Z01419 Encounter for gynecological examination (general) (routine) without abnormal findings: Secondary | ICD-10-CM | POA: Diagnosis not present

## 2018-09-19 DIAGNOSIS — Z6830 Body mass index (BMI) 30.0-30.9, adult: Secondary | ICD-10-CM | POA: Diagnosis not present

## 2018-09-19 DIAGNOSIS — N958 Other specified menopausal and perimenopausal disorders: Secondary | ICD-10-CM | POA: Diagnosis not present

## 2018-09-20 ENCOUNTER — Telehealth: Payer: Self-pay | Admitting: *Deleted

## 2018-09-20 DIAGNOSIS — C50412 Malignant neoplasm of upper-outer quadrant of left female breast: Secondary | ICD-10-CM | POA: Diagnosis not present

## 2018-09-20 DIAGNOSIS — M150 Primary generalized (osteo)arthritis: Secondary | ICD-10-CM | POA: Diagnosis not present

## 2018-09-20 DIAGNOSIS — I130 Hypertensive heart and chronic kidney disease with heart failure and stage 1 through stage 4 chronic kidney disease, or unspecified chronic kidney disease: Secondary | ICD-10-CM | POA: Diagnosis not present

## 2018-09-20 DIAGNOSIS — N189 Chronic kidney disease, unspecified: Secondary | ICD-10-CM | POA: Diagnosis not present

## 2018-09-20 DIAGNOSIS — R7302 Impaired glucose tolerance (oral): Secondary | ICD-10-CM | POA: Diagnosis not present

## 2018-09-20 DIAGNOSIS — I5030 Unspecified diastolic (congestive) heart failure: Secondary | ICD-10-CM | POA: Diagnosis not present

## 2018-09-20 DIAGNOSIS — R2689 Other abnormalities of gait and mobility: Secondary | ICD-10-CM | POA: Diagnosis not present

## 2018-09-20 DIAGNOSIS — R531 Weakness: Secondary | ICD-10-CM | POA: Diagnosis not present

## 2018-09-23 ENCOUNTER — Telehealth: Payer: Self-pay | Admitting: Internal Medicine

## 2018-09-23 DIAGNOSIS — C50412 Malignant neoplasm of upper-outer quadrant of left female breast: Secondary | ICD-10-CM | POA: Diagnosis not present

## 2018-09-23 DIAGNOSIS — M150 Primary generalized (osteo)arthritis: Secondary | ICD-10-CM | POA: Diagnosis not present

## 2018-09-23 DIAGNOSIS — I130 Hypertensive heart and chronic kidney disease with heart failure and stage 1 through stage 4 chronic kidney disease, or unspecified chronic kidney disease: Secondary | ICD-10-CM | POA: Diagnosis not present

## 2018-09-23 DIAGNOSIS — N189 Chronic kidney disease, unspecified: Secondary | ICD-10-CM | POA: Diagnosis not present

## 2018-09-23 DIAGNOSIS — R531 Weakness: Secondary | ICD-10-CM | POA: Diagnosis not present

## 2018-09-23 DIAGNOSIS — R2689 Other abnormalities of gait and mobility: Secondary | ICD-10-CM | POA: Diagnosis not present

## 2018-09-23 DIAGNOSIS — R7302 Impaired glucose tolerance (oral): Secondary | ICD-10-CM | POA: Diagnosis not present

## 2018-09-23 DIAGNOSIS — I5030 Unspecified diastolic (congestive) heart failure: Secondary | ICD-10-CM | POA: Diagnosis not present

## 2018-09-23 NOTE — Telephone Encounter (Signed)
Copied from [place] [phone]. Topic: Quick Communication - See Telephone Encounter >> Sep 23, 2018  3:46 PM Antonieta Iba C wrote: CRM for notification. See Telephone encounter for: 09/23/18.  [place] - OT w/ [facility]   Frequency 1 week 1 and 2 week 4   CB: [phone]  okay to lvm

## 2018-09-26 NOTE — Telephone Encounter (Signed)
Verbal orders given via VM 

## 2018-09-27 DIAGNOSIS — C50412 Malignant neoplasm of upper-outer quadrant of left female breast: Secondary | ICD-10-CM | POA: Diagnosis not present

## 2018-09-27 DIAGNOSIS — R2689 Other abnormalities of gait and mobility: Secondary | ICD-10-CM | POA: Diagnosis not present

## 2018-09-27 DIAGNOSIS — R531 Weakness: Secondary | ICD-10-CM | POA: Diagnosis not present

## 2018-09-27 DIAGNOSIS — I130 Hypertensive heart and chronic kidney disease with heart failure and stage 1 through stage 4 chronic kidney disease, or unspecified chronic kidney disease: Secondary | ICD-10-CM | POA: Diagnosis not present

## 2018-09-27 DIAGNOSIS — N189 Chronic kidney disease, unspecified: Secondary | ICD-10-CM | POA: Diagnosis not present

## 2018-09-27 DIAGNOSIS — M150 Primary generalized (osteo)arthritis: Secondary | ICD-10-CM | POA: Diagnosis not present

## 2018-09-27 DIAGNOSIS — I5030 Unspecified diastolic (congestive) heart failure: Secondary | ICD-10-CM | POA: Diagnosis not present

## 2018-09-27 DIAGNOSIS — R7302 Impaired glucose tolerance (oral): Secondary | ICD-10-CM | POA: Diagnosis not present

## 2018-09-28 ENCOUNTER — Telehealth: Payer: Self-pay | Admitting: Internal Medicine

## 2018-09-28 NOTE — Telephone Encounter (Signed)
Copied from [place] [phone]. Topic: Quick Communication - Home Health Verbal Orders >> Sep 28, 2018  3:15 PM Berneta Levins wrote: Caller/Agency: Junie Panning with [place] Number: [phone], [place] to leave a message Requesting OT/PT/Skilled Nursing/Social Work: PT Frequency: 2x week for 4 weeks

## 2018-09-28 NOTE — Telephone Encounter (Signed)
Ok for verbals 

## 2018-09-28 NOTE — Telephone Encounter (Signed)
Routing to dr john, please advise, thanks 

## 2018-09-29 ENCOUNTER — Telehealth: Payer: Self-pay | Admitting: Internal Medicine

## 2018-09-29 DIAGNOSIS — C50412 Malignant neoplasm of upper-outer quadrant of left female breast: Secondary | ICD-10-CM | POA: Diagnosis not present

## 2018-09-29 DIAGNOSIS — R2689 Other abnormalities of gait and mobility: Secondary | ICD-10-CM | POA: Diagnosis not present

## 2018-09-29 DIAGNOSIS — N189 Chronic kidney disease, unspecified: Secondary | ICD-10-CM | POA: Diagnosis not present

## 2018-09-29 DIAGNOSIS — R531 Weakness: Secondary | ICD-10-CM | POA: Diagnosis not present

## 2018-09-29 DIAGNOSIS — M150 Primary generalized (osteo)arthritis: Secondary | ICD-10-CM | POA: Diagnosis not present

## 2018-09-29 DIAGNOSIS — R7302 Impaired glucose tolerance (oral): Secondary | ICD-10-CM | POA: Diagnosis not present

## 2018-09-29 DIAGNOSIS — I5030 Unspecified diastolic (congestive) heart failure: Secondary | ICD-10-CM | POA: Diagnosis not present

## 2018-09-29 DIAGNOSIS — I130 Hypertensive heart and chronic kidney disease with heart failure and stage 1 through stage 4 chronic kidney disease, or unspecified chronic kidney disease: Secondary | ICD-10-CM | POA: Diagnosis not present

## 2018-09-29 NOTE — Telephone Encounter (Signed)
Copied from [place] [id number]. Topic: General - Other >> Sep 29, 2018  2:47 PM Antonieta Iba C wrote: Reason for CRM:    [phone] - OT is calling in to make provider aware that pt had a missed visit today because she had another apt today.

## 2018-09-29 NOTE — Telephone Encounter (Signed)
Advised erin of dr Gwynn Burly instructions/orders

## 2018-09-29 NOTE — Telephone Encounter (Signed)
Noted  

## 2018-09-30 DIAGNOSIS — C50412 Malignant neoplasm of upper-outer quadrant of left female breast: Secondary | ICD-10-CM | POA: Diagnosis not present

## 2018-09-30 DIAGNOSIS — R2689 Other abnormalities of gait and mobility: Secondary | ICD-10-CM | POA: Diagnosis not present

## 2018-09-30 DIAGNOSIS — R531 Weakness: Secondary | ICD-10-CM | POA: Diagnosis not present

## 2018-09-30 DIAGNOSIS — R7302 Impaired glucose tolerance (oral): Secondary | ICD-10-CM | POA: Diagnosis not present

## 2018-09-30 DIAGNOSIS — I130 Hypertensive heart and chronic kidney disease with heart failure and stage 1 through stage 4 chronic kidney disease, or unspecified chronic kidney disease: Secondary | ICD-10-CM | POA: Diagnosis not present

## 2018-09-30 DIAGNOSIS — N189 Chronic kidney disease, unspecified: Secondary | ICD-10-CM | POA: Diagnosis not present

## 2018-09-30 DIAGNOSIS — I5030 Unspecified diastolic (congestive) heart failure: Secondary | ICD-10-CM | POA: Diagnosis not present

## 2018-09-30 DIAGNOSIS — M150 Primary generalized (osteo)arthritis: Secondary | ICD-10-CM | POA: Diagnosis not present

## 2018-10-04 ENCOUNTER — Telehealth: Payer: Self-pay | Admitting: Internal Medicine

## 2018-10-04 DIAGNOSIS — I130 Hypertensive heart and chronic kidney disease with heart failure and stage 1 through stage 4 chronic kidney disease, or unspecified chronic kidney disease: Secondary | ICD-10-CM | POA: Diagnosis not present

## 2018-10-04 DIAGNOSIS — C50412 Malignant neoplasm of upper-outer quadrant of left female breast: Secondary | ICD-10-CM | POA: Diagnosis not present

## 2018-10-04 DIAGNOSIS — R2689 Other abnormalities of gait and mobility: Secondary | ICD-10-CM | POA: Diagnosis not present

## 2018-10-04 DIAGNOSIS — R7302 Impaired glucose tolerance (oral): Secondary | ICD-10-CM | POA: Diagnosis not present

## 2018-10-04 DIAGNOSIS — M150 Primary generalized (osteo)arthritis: Secondary | ICD-10-CM | POA: Diagnosis not present

## 2018-10-04 DIAGNOSIS — R531 Weakness: Secondary | ICD-10-CM | POA: Diagnosis not present

## 2018-10-04 DIAGNOSIS — I5030 Unspecified diastolic (congestive) heart failure: Secondary | ICD-10-CM | POA: Diagnosis not present

## 2018-10-04 DIAGNOSIS — N189 Chronic kidney disease, unspecified: Secondary | ICD-10-CM | POA: Diagnosis not present

## 2018-10-04 NOTE — Telephone Encounter (Signed)
Verbal orders given  

## 2018-10-04 NOTE — Telephone Encounter (Signed)
Copied from [place] [id number]. Topic: Quick Communication - Home Health Verbal Orders >> Oct 04, 2018 10:34 AM Lennox Solders wrote: Caller/Agency:christie rn [place] Number: [phone] Requesting for home health aid for twice a wk for about 5 wk

## 2018-10-05 ENCOUNTER — Telehealth: Payer: Self-pay | Admitting: Oncology

## 2018-10-05 DIAGNOSIS — R7302 Impaired glucose tolerance (oral): Secondary | ICD-10-CM | POA: Diagnosis not present

## 2018-10-05 DIAGNOSIS — R531 Weakness: Secondary | ICD-10-CM | POA: Diagnosis not present

## 2018-10-05 DIAGNOSIS — I130 Hypertensive heart and chronic kidney disease with heart failure and stage 1 through stage 4 chronic kidney disease, or unspecified chronic kidney disease: Secondary | ICD-10-CM | POA: Diagnosis not present

## 2018-10-05 DIAGNOSIS — R2689 Other abnormalities of gait and mobility: Secondary | ICD-10-CM | POA: Diagnosis not present

## 2018-10-05 DIAGNOSIS — I5030 Unspecified diastolic (congestive) heart failure: Secondary | ICD-10-CM | POA: Diagnosis not present

## 2018-10-05 DIAGNOSIS — C50412 Malignant neoplasm of upper-outer quadrant of left female breast: Secondary | ICD-10-CM | POA: Diagnosis not present

## 2018-10-05 DIAGNOSIS — M150 Primary generalized (osteo)arthritis: Secondary | ICD-10-CM | POA: Diagnosis not present

## 2018-10-05 DIAGNOSIS — N189 Chronic kidney disease, unspecified: Secondary | ICD-10-CM | POA: Diagnosis not present

## 2018-10-05 NOTE — Telephone Encounter (Signed)
Scheduled appt per 12/10 sch message - pt is aware of appt date and time   

## 2018-10-07 DIAGNOSIS — M150 Primary generalized (osteo)arthritis: Secondary | ICD-10-CM | POA: Diagnosis not present

## 2018-10-07 DIAGNOSIS — C50412 Malignant neoplasm of upper-outer quadrant of left female breast: Secondary | ICD-10-CM | POA: Diagnosis not present

## 2018-10-07 DIAGNOSIS — R7302 Impaired glucose tolerance (oral): Secondary | ICD-10-CM | POA: Diagnosis not present

## 2018-10-07 DIAGNOSIS — R531 Weakness: Secondary | ICD-10-CM | POA: Diagnosis not present

## 2018-10-07 DIAGNOSIS — I130 Hypertensive heart and chronic kidney disease with heart failure and stage 1 through stage 4 chronic kidney disease, or unspecified chronic kidney disease: Secondary | ICD-10-CM | POA: Diagnosis not present

## 2018-10-07 DIAGNOSIS — N189 Chronic kidney disease, unspecified: Secondary | ICD-10-CM | POA: Diagnosis not present

## 2018-10-07 DIAGNOSIS — R2689 Other abnormalities of gait and mobility: Secondary | ICD-10-CM | POA: Diagnosis not present

## 2018-10-07 DIAGNOSIS — I5030 Unspecified diastolic (congestive) heart failure: Secondary | ICD-10-CM | POA: Diagnosis not present

## 2018-10-10 DIAGNOSIS — M069 Rheumatoid arthritis, unspecified: Secondary | ICD-10-CM | POA: Diagnosis not present

## 2018-10-10 DIAGNOSIS — R69 Illness, unspecified: Secondary | ICD-10-CM | POA: Diagnosis not present

## 2018-10-10 DIAGNOSIS — I10 Essential (primary) hypertension: Secondary | ICD-10-CM | POA: Diagnosis not present

## 2018-10-10 DIAGNOSIS — J309 Allergic rhinitis, unspecified: Secondary | ICD-10-CM | POA: Diagnosis not present

## 2018-10-10 DIAGNOSIS — R32 Unspecified urinary incontinence: Secondary | ICD-10-CM | POA: Diagnosis not present

## 2018-10-10 DIAGNOSIS — K219 Gastro-esophageal reflux disease without esophagitis: Secondary | ICD-10-CM | POA: Diagnosis not present

## 2018-10-10 DIAGNOSIS — M62838 Other muscle spasm: Secondary | ICD-10-CM | POA: Diagnosis not present

## 2018-10-10 DIAGNOSIS — C50919 Malignant neoplasm of unspecified site of unspecified female breast: Secondary | ICD-10-CM | POA: Diagnosis not present

## 2018-10-10 DIAGNOSIS — G8929 Other chronic pain: Secondary | ICD-10-CM | POA: Diagnosis not present

## 2018-10-10 DIAGNOSIS — J449 Chronic obstructive pulmonary disease, unspecified: Secondary | ICD-10-CM | POA: Diagnosis not present

## 2018-10-11 DIAGNOSIS — I5030 Unspecified diastolic (congestive) heart failure: Secondary | ICD-10-CM | POA: Diagnosis not present

## 2018-10-11 DIAGNOSIS — C50412 Malignant neoplasm of upper-outer quadrant of left female breast: Secondary | ICD-10-CM | POA: Diagnosis not present

## 2018-10-11 DIAGNOSIS — N189 Chronic kidney disease, unspecified: Secondary | ICD-10-CM | POA: Diagnosis not present

## 2018-10-11 DIAGNOSIS — I130 Hypertensive heart and chronic kidney disease with heart failure and stage 1 through stage 4 chronic kidney disease, or unspecified chronic kidney disease: Secondary | ICD-10-CM | POA: Diagnosis not present

## 2018-10-11 DIAGNOSIS — M150 Primary generalized (osteo)arthritis: Secondary | ICD-10-CM | POA: Diagnosis not present

## 2018-10-11 DIAGNOSIS — R7302 Impaired glucose tolerance (oral): Secondary | ICD-10-CM | POA: Diagnosis not present

## 2018-10-11 DIAGNOSIS — R2689 Other abnormalities of gait and mobility: Secondary | ICD-10-CM | POA: Diagnosis not present

## 2018-10-11 DIAGNOSIS — R531 Weakness: Secondary | ICD-10-CM | POA: Diagnosis not present

## 2018-10-13 DIAGNOSIS — N189 Chronic kidney disease, unspecified: Secondary | ICD-10-CM | POA: Diagnosis not present

## 2018-10-13 DIAGNOSIS — M150 Primary generalized (osteo)arthritis: Secondary | ICD-10-CM | POA: Diagnosis not present

## 2018-10-13 DIAGNOSIS — C50412 Malignant neoplasm of upper-outer quadrant of left female breast: Secondary | ICD-10-CM | POA: Diagnosis not present

## 2018-10-13 DIAGNOSIS — I5030 Unspecified diastolic (congestive) heart failure: Secondary | ICD-10-CM | POA: Diagnosis not present

## 2018-10-13 DIAGNOSIS — R2689 Other abnormalities of gait and mobility: Secondary | ICD-10-CM | POA: Diagnosis not present

## 2018-10-13 DIAGNOSIS — R531 Weakness: Secondary | ICD-10-CM | POA: Diagnosis not present

## 2018-10-13 DIAGNOSIS — I130 Hypertensive heart and chronic kidney disease with heart failure and stage 1 through stage 4 chronic kidney disease, or unspecified chronic kidney disease: Secondary | ICD-10-CM | POA: Diagnosis not present

## 2018-10-13 DIAGNOSIS — R7302 Impaired glucose tolerance (oral): Secondary | ICD-10-CM | POA: Diagnosis not present

## 2018-10-17 DIAGNOSIS — R2689 Other abnormalities of gait and mobility: Secondary | ICD-10-CM | POA: Diagnosis not present

## 2018-10-17 DIAGNOSIS — R7302 Impaired glucose tolerance (oral): Secondary | ICD-10-CM | POA: Diagnosis not present

## 2018-10-17 DIAGNOSIS — R531 Weakness: Secondary | ICD-10-CM | POA: Diagnosis not present

## 2018-10-17 DIAGNOSIS — M150 Primary generalized (osteo)arthritis: Secondary | ICD-10-CM | POA: Diagnosis not present

## 2018-10-17 DIAGNOSIS — I5030 Unspecified diastolic (congestive) heart failure: Secondary | ICD-10-CM | POA: Diagnosis not present

## 2018-10-17 DIAGNOSIS — I130 Hypertensive heart and chronic kidney disease with heart failure and stage 1 through stage 4 chronic kidney disease, or unspecified chronic kidney disease: Secondary | ICD-10-CM | POA: Diagnosis not present

## 2018-10-17 DIAGNOSIS — N189 Chronic kidney disease, unspecified: Secondary | ICD-10-CM | POA: Diagnosis not present

## 2018-10-17 DIAGNOSIS — C50412 Malignant neoplasm of upper-outer quadrant of left female breast: Secondary | ICD-10-CM | POA: Diagnosis not present

## 2018-10-18 DIAGNOSIS — N189 Chronic kidney disease, unspecified: Secondary | ICD-10-CM | POA: Diagnosis not present

## 2018-10-18 DIAGNOSIS — R2689 Other abnormalities of gait and mobility: Secondary | ICD-10-CM | POA: Diagnosis not present

## 2018-10-18 DIAGNOSIS — R7302 Impaired glucose tolerance (oral): Secondary | ICD-10-CM | POA: Diagnosis not present

## 2018-10-18 DIAGNOSIS — M150 Primary generalized (osteo)arthritis: Secondary | ICD-10-CM | POA: Diagnosis not present

## 2018-10-18 DIAGNOSIS — R531 Weakness: Secondary | ICD-10-CM | POA: Diagnosis not present

## 2018-10-18 DIAGNOSIS — C50412 Malignant neoplasm of upper-outer quadrant of left female breast: Secondary | ICD-10-CM | POA: Diagnosis not present

## 2018-10-18 DIAGNOSIS — I130 Hypertensive heart and chronic kidney disease with heart failure and stage 1 through stage 4 chronic kidney disease, or unspecified chronic kidney disease: Secondary | ICD-10-CM | POA: Diagnosis not present

## 2018-10-18 DIAGNOSIS — I5030 Unspecified diastolic (congestive) heart failure: Secondary | ICD-10-CM | POA: Diagnosis not present

## 2018-10-20 ENCOUNTER — Telehealth: Payer: Self-pay | Admitting: Internal Medicine

## 2018-10-20 DIAGNOSIS — R7302 Impaired glucose tolerance (oral): Secondary | ICD-10-CM | POA: Diagnosis not present

## 2018-10-20 DIAGNOSIS — N189 Chronic kidney disease, unspecified: Secondary | ICD-10-CM | POA: Diagnosis not present

## 2018-10-20 DIAGNOSIS — M150 Primary generalized (osteo)arthritis: Secondary | ICD-10-CM | POA: Diagnosis not present

## 2018-10-20 DIAGNOSIS — C50412 Malignant neoplasm of upper-outer quadrant of left female breast: Secondary | ICD-10-CM | POA: Diagnosis not present

## 2018-10-20 DIAGNOSIS — R531 Weakness: Secondary | ICD-10-CM | POA: Diagnosis not present

## 2018-10-20 DIAGNOSIS — R2689 Other abnormalities of gait and mobility: Secondary | ICD-10-CM | POA: Diagnosis not present

## 2018-10-20 DIAGNOSIS — I130 Hypertensive heart and chronic kidney disease with heart failure and stage 1 through stage 4 chronic kidney disease, or unspecified chronic kidney disease: Secondary | ICD-10-CM | POA: Diagnosis not present

## 2018-10-20 DIAGNOSIS — I5030 Unspecified diastolic (congestive) heart failure: Secondary | ICD-10-CM | POA: Diagnosis not present

## 2018-10-20 NOTE — Telephone Encounter (Signed)
Copied from [place] [phone]. Topic: Quick Communication - Home Health Verbal Orders >> Oct 20, 2018  9:49 AM Berneta Levins wrote: Caller/Agency: RCVELFY with [place] Number: [phone], [place] to leave a message Requesting OT/PT/Skilled Nursing/Social Work: OT Frequency: 2x week for 2 weeks

## 2018-10-20 NOTE — Telephone Encounter (Signed)
Verbal orders given via VM 

## 2018-10-20 NOTE — Telephone Encounter (Signed)
losartan-hydrochlorothiazide (HYZAAR) 100-25 MG tablet     Pt states per  pharmacy med has been on backorder. Pt questioning what Dr Jenny Reichmann would like her to do. States she has been out of this med for a week.      Preferred Pharmacy    [facility]

## 2018-10-20 NOTE — Telephone Encounter (Signed)
Copied from [place] [id number]. Topic: Quick Communication - Rx Refill/Question >> Oct 20, 2018  2:37 PM Carolyn Stare wrote: Medication    losartan-hydrochlorothiazide (HYZAAR) 100-25 MG tablet     pt said pharmacy has gotten a different strength on this med in because it has been on backorder and she is asking what Dr Jenny Reichmann would like her to do cause she has been out of this med for a week.    Has the patient contacted their pharmacy yes    Preferred Pharmacy    [facility]   Agent: Please be advised that RX refills may take up to 3 business days. We ask that you follow-up with your pharmacy.

## 2018-10-21 DIAGNOSIS — R7302 Impaired glucose tolerance (oral): Secondary | ICD-10-CM | POA: Diagnosis not present

## 2018-10-21 DIAGNOSIS — R2689 Other abnormalities of gait and mobility: Secondary | ICD-10-CM | POA: Diagnosis not present

## 2018-10-21 DIAGNOSIS — I5030 Unspecified diastolic (congestive) heart failure: Secondary | ICD-10-CM | POA: Diagnosis not present

## 2018-10-21 DIAGNOSIS — M150 Primary generalized (osteo)arthritis: Secondary | ICD-10-CM | POA: Diagnosis not present

## 2018-10-21 DIAGNOSIS — R531 Weakness: Secondary | ICD-10-CM | POA: Diagnosis not present

## 2018-10-21 DIAGNOSIS — N189 Chronic kidney disease, unspecified: Secondary | ICD-10-CM | POA: Diagnosis not present

## 2018-10-21 DIAGNOSIS — C50412 Malignant neoplasm of upper-outer quadrant of left female breast: Secondary | ICD-10-CM | POA: Diagnosis not present

## 2018-10-21 DIAGNOSIS — I130 Hypertensive heart and chronic kidney disease with heart failure and stage 1 through stage 4 chronic kidney disease, or unspecified chronic kidney disease: Secondary | ICD-10-CM | POA: Diagnosis not present

## 2018-10-21 NOTE — Telephone Encounter (Addendum)
Ok Hyzaar 50-12.5 -- 2 tabs a day #60 with 11 ref Thx

## 2018-10-24 ENCOUNTER — Other Ambulatory Visit: Payer: Self-pay

## 2018-10-24 DIAGNOSIS — N189 Chronic kidney disease, unspecified: Secondary | ICD-10-CM | POA: Diagnosis not present

## 2018-10-24 DIAGNOSIS — C50412 Malignant neoplasm of upper-outer quadrant of left female breast: Secondary | ICD-10-CM | POA: Diagnosis not present

## 2018-10-24 DIAGNOSIS — I5030 Unspecified diastolic (congestive) heart failure: Secondary | ICD-10-CM | POA: Diagnosis not present

## 2018-10-24 DIAGNOSIS — Z170 Estrogen receptor positive status [ER+]: Secondary | ICD-10-CM

## 2018-10-24 DIAGNOSIS — R531 Weakness: Secondary | ICD-10-CM | POA: Diagnosis not present

## 2018-10-24 DIAGNOSIS — I130 Hypertensive heart and chronic kidney disease with heart failure and stage 1 through stage 4 chronic kidney disease, or unspecified chronic kidney disease: Secondary | ICD-10-CM | POA: Diagnosis not present

## 2018-10-24 DIAGNOSIS — R2689 Other abnormalities of gait and mobility: Secondary | ICD-10-CM | POA: Diagnosis not present

## 2018-10-24 DIAGNOSIS — R7302 Impaired glucose tolerance (oral): Secondary | ICD-10-CM | POA: Diagnosis not present

## 2018-10-24 DIAGNOSIS — M150 Primary generalized (osteo)arthritis: Secondary | ICD-10-CM | POA: Diagnosis not present

## 2018-10-24 NOTE — Progress Notes (Signed)
[name]  [phone] [phone]) [phone]   ID: [name] OB: 13-Nov-1935  MR#: [id number]  LKJ#:[id number]  Patient Care Team: Biagio Borg, MD as PCP - General (Internal Medicine) [name], [name], MD as Consulting Physician (Oncology) [name], MD as Consulting Physician (Radiation Oncology) [name], MD as Consulting Physician (Obstetrics and Gynecology) [name], FNP as Nurse Practitioner (Psychiatry) [name], MD as Consulting Physician (Radiology)  SU: [name], MD OTHER MD: [name], MD; [name], MD  CHIEF COMPLAINT: Estrogen receptor positive breast Cancer  CURRENT TREATMENT: Observation  BREAST CANCER HISTORY: From the original intake note:  The patient underwent routine screening mammography at [facility] 10/09/2013 showing a 1 cm cluster of calcifications in the right breast at 5:00. There was also a 9 mm irregular density in the left breast laterally. Bilateral diagnostic mammography with tomography at [facility] 11/01/2013 confirmed a 1 cm cluster of vascular calcifications at the 5:00 position of the right breast, which were felt to be stable. However in the left breast there was a new 1.1 cm mass with spiculated margins. This was at 1:00, 8 cm from the nipple. Ultrasound of the left breast the same day showed the mass to measure 6 mm and to be hypoechoic. The left axilla was unremarkable.  Biopsy of the left breast mass in question 11/06/2013 showed ([id number]) and invasive ductal carcinoma, grade 2, estrogen receptor 100% positive, progesterone receptor 95% positive, both with strong staining intensity, with an MIB-1 of 19% and no HER-2 amplification, the signals ratio being 0.95 and the number per cell 2.00. E-cadherin was positive.  The patient's subsequent history is as detailed below  INTERVAL HISTORY: [name] returns today for follow-up of her estrogen receptor positive breast  cancer. She is accompanied by her daughter.  The patient discontinued her anastrozole on her own account; she discontinued her medicine around the 2nd week of 09/2018 due to itching, which she attributed to the treatment.  However the itching persists and is not any better off the medication  [name]'s last bone density screening on file was completed on 03/15/2013 at [facility] in [place]. She has not received an updated bone density scan from [facility], where she receives her mammography.  She tells me she usually gets her bone density scans from Dr. [name]  Since her last visit here on 03/28/2018, she underwent a chest xray on 05/30/2018 showing that her heart size and mediastinal contours are stable. Her lungs are clear. There is no pleural effusion or pneumothorax seen. Chronic scoliosis of the thoracolumbar spine. No acute or suspicious osseous finding.   She will be due for mammography in 11/2018.   REVIEW OF SYSTEMS:  [name] is having stiff shoulders and whole body itching. The itching started shortly after she started her anastrozole treatment, but has not resolved since she discontinued herself from it. She itches everywhere except her private area. She does not palpate a rash with the itch. She took Claritin, without results. The patient denies unusual headaches, visual changes, nausea, vomiting, or dizziness. There has been no unusual cough, phlegm production, or pleurisy. This been no change in bowel or bladder habits. The patient denies unexplained fatigue or unexplained weight loss, bleeding, or fever. A detailed review of systems was otherwise noncontributory.    PAST MEDICAL HISTORY: Past Medical History:  Diagnosis Date  . Allergic rhinitis 10/30/2016  . Anemia   . Asthma   . Breast cancer of upper-outer quadrant of left  female breast ([name]) 11/08/2013   ER/PR+ Her2- Left IDC   . Chronic renal insufficiency   . Chronic rhinitis   . Colon polyp   . Diastolic  dysfunction 02/25/7740  . DJD (degenerative joint disease)   . Dyspnea   . Full dentures   . GERD (gastroesophageal reflux disease)   . Hearing loss   . Hypertension   . Hyponatremia   . Impaired glucose tolerance 07/18/2014  . Memory loss   . Morbid obesity ([place])   . Poor circulation   . Vertigo   . Wears glasses     PAST SURGICAL HISTORY: Past Surgical History:  Procedure Laterality Date  . ABDOMINAL HYSTERECTOMY    . BREAST LUMPECTOMY WITH NEEDLE LOCALIZATION AND AXILLARY SENTINEL LYMPH NODE BX Left 12/04/2013   Procedure: BREAST LUMPECTOMY WITH NEEDLE LOCALIZATION AND AXILLARY SENTINEL LYMPH NODE BX;  Surgeon: Shann Medal, MD;  Location: [facility];  Service: General;  Laterality: Left;  . CATARACT EXTRACTION  2009   rt  . COLONOSCOPY    . EYE SURGERY Bilateral    cataract surgery  . KNEE ARTHROSCOPY     both  . LUMBAR LAMINECTOMY/DECOMPRESSION MICRODISCECTOMY Left 12/23/2017   Procedure: Left Lumbar One-Two Laminectomy with microdiscectomy;  Surgeon: Eustace Moore, MD;  Location: [facility];  Service: Neurosurgery;  Laterality: Left;  Left L1-2 Laminectomy with microdiscectomy  . TONSILLECTOMY    . TOTAL KNEE ARTHROPLASTY  2002   rt  . TOTAL KNEE ARTHROPLASTY  2003   left  . VESICOVAGINAL FISTULA CLOSURE W/ TAH  1980   Status post hysterectomy with bilateral sloping oophorectomy   FAMILY HISTORY Family History  Problem Relation Age of Onset  . Colon cancer Mother        in her 30's  . Stomach cancer Mother   . Heart disease Unknown        remote family  . Lung cancer Brother        was a smoker  . Heart attack Son 40   The patient's mother died at the age of 43, with a history of colon cancer. The patient's father died at the age of 67 with "stomach cancer". The patient had 5 brothers, 3 sisters. One brother had lung cancer diagnosed at age 28. One maternal aunt had colon cancer at age 51. There is no history of breast or ovarian cancer in the family  to her knowledge.   GYNECOLOGIC HISTORY:   (Reviewed 03/28/2014) Menarche age 69, first live birth age 56, the patient is [place] P2. She underwent hysterectomy with bilateral salpingo-oophorectomy approximately 34 years ago. She did not use hormone replacement. She did use birth control remotely, for about 15 years, with no complications.   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 She is widowed, lives with her daughter, with no pets. Son [name] died from a myocardial infarction at the age of 36. Daughter Jodell Weitman works in [place] as an Microbiologist (at [employer]). The patient has 2 grandchildren and one great-grandchild. She is a Psychologist, forensic.   ADVANCED DIRECTIVES: In place; the patient's daughter [name] is her healthcare power of attorney. [name] can be reached at [phone] (cell), and [phone] (work).   HEALTH MAINTENANCE:  (Updated 03/28/2014) Social History   Tobacco Use  . Smoking status: Never Smoker  . Smokeless tobacco: Never Used  Substance Use Topics  . Alcohol use: No  Alcohol/week: 0.0 standard drinks  . Drug use: No    Colonoscopy:  Oct 2011/DrHenrene Pastor  PAP: s/p hysterectomy   Bone density: 03/15/2013 at [facility] in [place], shows normal bone density  Lipid panel:  April 2015/Dr. John   Allergies  Allergen Reactions  . Hydrocodone Itching  . Tizanidine Other (See Comments)    Dizzy and fall  . Iron Hives and Other (See Comments)    [place], bad constipation    Current Outpatient Medications  Medication Sig Dispense Refill  . albuterol (PROVENTIL HFA;VENTOLIN HFA) 108 (90 Base) MCG/ACT inhaler Inhale 2 puffs into the lungs every 6 (six) hours as needed for wheezing or shortness of breath. 1 Inhaler 11  . amLODipine (NORVASC) 5 MG tablet Take 1 tablet (5 mg total) by mouth daily. 90 tablet 1  . aspirin 81 MG tablet Take 81 mg by mouth daily.       [name] erythromycin ophthalmic ointment Place 1 application into the left eye 4 (four) times daily. For 10 days 3.5 g 0  . furosemide (LASIX) 20 MG tablet Take 1 tablet (20 mg total) by mouth daily. 90 tablet 1  . gabapentin (NEURONTIN) 100 MG capsule TAKE ONE CAPSULE BY MOUTH 3 TIMES A DAY 90 capsule 2  . hydrOXYzine (VISTARIL) 50 MG capsule Take 1 capsule (50 mg total) by mouth at bedtime as needed for itching. 30 capsule 1  . iron polysaccharides (NU-IRON) 150 MG capsule Take 1 capsule (150 mg total) by mouth 2 (two) times daily. 60 capsule 5  . losartan-hydrochlorothiazide (HYZAAR) 100-25 MG tablet Take 1 tablet by mouth daily. 90 tablet 1  . meclizine (ANTIVERT) 12.5 MG tablet TAKE 1 TABLET THREE TIMES DAILY AS NEEDED  FOR  DIZZINESS (Patient taking differently: TAKE 12.5 MG THREE TIMES DAILY AS NEEDED  FOR  DIZZINESS) 270 tablet 0  . methocarbamol (ROBAXIN) 500 MG tablet Take 1 tablet (500 mg total) by mouth every 8 (eight) hours as needed for muscle spasms. 10 tablet 0  . mometasone-formoterol (DULERA) 200-5 MCG/ACT AERO Inhale 2 puffs into the lungs 2 (two) times daily. 13 g 11  . montelukast (SINGULAIR) 10 MG tablet Take 1 tablet (10 mg total) by mouth daily. 90 tablet 3  . Multiple Vitamins-Minerals (CENTRUM SILVER PO) Take 1 tablet by mouth daily.      . pantoprazole (PROTONIX) 40 MG tablet Take 1 tablet (40 mg total) by mouth daily. 90 tablet 1  . Potassium Gluconate 550 (90 K) MG TABS Take 550 mg by mouth daily.     . Simethicone (GAS-X PO) Take 1 tablet by mouth daily as needed (for gas).     . solifenacin (VESICARE) 5 MG tablet Take 1 tablet (5 mg total) by mouth daily. 90 tablet 1  . tizanidine (ZANAFLEX) 2 MG capsule   1  . triamcinolone (NASACORT AQ) 55 MCG/ACT AERO nasal inhaler Place 2 sprays into the nose daily. 1 Inhaler 12  . triamcinolone cream (KENALOG) 0.1 % Apply 1 application topically 2 (two) times daily. 30 g 0  . Vitamin D, Ergocalciferol, (DRISDOL) 50000 units CAPS  capsule Take 1 capsule (50,000 Units total) by mouth every 7 (seven) days. (Patient taking differently: Take 50,000 Units by mouth every 7 (seven) days. On Friday) 12 capsule 0   No current facility-administered medications for this visit.     OBJECTIVE: Older African American woman who appears stated age  82:   10/25/18 1008  BP: (!) 155/83  Pulse: 94  Resp:  17  Temp: (!) 97.5 F (36.4 C)  SpO2: 100%     Body mass index is 28.17 kg/m.    ECOG FS: 2 Filed Weights   10/25/18 1008  Weight: 159 lb (72.1 kg)   Sclerae unicteric, pupils round and equal No cervical or supraclavicular adenopathy Lungs no rales or rhonchi Heart regular rate and rhythm Abd soft, nontender, positive bowel sounds MSK no focal spinal tenderness, no upper extremity lymphedema Neuro: nonfocal, well oriented, positive affect Breasts: The right breast is status post lumpectomy with no evidence of disease recurrence.  The left breast is benign.  Both axillae are benign.  LAB RESULTS:   Lab Results  Component Value Date   WBC 7.7 10/25/2018   NEUTROABS 5.3 10/25/2018   HGB 10.0 (L) 10/25/2018   HCT 31.4 (L) 10/25/2018   MCV 99.1 10/25/2018   PLT 254 10/25/2018      Chemistry      Component Value Date/Time   NA 138 10/25/2018 0946   NA 138 07/27/2016 0935   K 3.6 10/25/2018 0946   K 4.4 07/27/2016 0935   CL 98 10/25/2018 0946   CO2 27 10/25/2018 0946   CO2 25 07/27/2016 0935   BUN 21 10/25/2018 0946   BUN 24.7 07/27/2016 0935   CREATININE 1.31 (H) 10/25/2018 0946   CREATININE 1.4 (H) 07/27/2016 0935      Component Value Date/Time   CALCIUM 10.0 10/25/2018 0946   CALCIUM 9.1 07/27/2016 0935   ALKPHOS 92 10/25/2018 0946   ALKPHOS 50 07/27/2016 0935   AST 32 10/25/2018 0946   AST 23 07/27/2016 0935   ALT 27 10/25/2018 0946   ALT 18 07/27/2016 0935   BILITOT 0.4 10/25/2018 0946   BILITOT 0.32 07/27/2016 0935       STUDIES: No results found.   ASSESSMENT: 82 y.o. [place]  woman status post left breast biopsy 11/06/2013 for a clinical T1b N0, stage IA invasive ductal carcinoma, grade 2, estrogen receptor 100% positive, progesterone receptor 95% positive, with an MIB-1 of 19% and no HER-2 amplification  (1) status post left lumpectomy and sentinel lymph node sampling 12/04/2013 for a pT1c pN0, stage IA invasive ductal carcinoma, grade 1, repeat HER-2 again negative, with ample margins  (2) anastrozole Mar 2015-Dec 2015, letrozole Jan 2016-Feb 2016. Started tamoxifen March 2016, switched back to anastrozole June 2019 because of bilateral DVTs documented March 2019  (a) antiestrogens finally discontinued November 2019  (3) opted to forgo radiation therapy   PLAN: Hula is just about 5 years out from definitive surgery for her breast cancer with no evidence of disease recurrence.  This is very favorable.  She has received just about 5 years of antiestrogens.  I do not think it is necessary for her to just take a few more months of anastrozole and I am comfortable with her having stopped about 3 weeks ago because of the itching.  I do think the itching is likely to be due to medication.  Going through her list it has Robitussin including codeine, hydrocodone, and oxycodone.  She is not sure if she is actually taking any of these.  She is pretty sure she is not on tramadol.  I suggested she stop all those medications and wait a couple of weeks and if the itching resolves and that was the problem.  In the meantime I wrote her for Atarax 50 mg to take at bedtime.  I also refilled her triamcinolone cream which she may use as needed.  At this point I feel comfortable releasing her to her primary care physicians care.  All she will need in terms of breast cancer follow-up is her yearly mammogram and her yearly physician's breast exam  I will be glad to see Shaelyn at any point in the future if and when the need arises but as of now are making no further routine  appointments for her here    Chauncey Cruel, MD  10/25/18 10:56 AM Medical Oncology and Hematology [facility] [place], [place] [address] Tel. [phone]    Fax. [phone]    I, Jacqualyn Posey am acting as a Education administrator for Chauncey Cruel, MD.   I, Lurline Del MD, have reviewed the above documentation for accuracy and completeness, and I agree with the above.

## 2018-10-25 ENCOUNTER — Telehealth: Payer: Self-pay | Admitting: Oncology

## 2018-10-25 ENCOUNTER — Encounter: Payer: Self-pay | Admitting: Oncology

## 2018-10-25 ENCOUNTER — Inpatient Hospital Stay: Payer: Medicare HMO

## 2018-10-25 ENCOUNTER — Inpatient Hospital Stay: Payer: Medicare HMO | Attending: Oncology | Admitting: Oncology

## 2018-10-25 VITALS — BP 155/83 | HR 94 | Temp 97.5°F | Resp 17 | Ht 63.0 in | Wt 159.0 lb

## 2018-10-25 DIAGNOSIS — N189 Chronic kidney disease, unspecified: Secondary | ICD-10-CM | POA: Diagnosis not present

## 2018-10-25 DIAGNOSIS — C50412 Malignant neoplasm of upper-outer quadrant of left female breast: Secondary | ICD-10-CM

## 2018-10-25 DIAGNOSIS — Z90710 Acquired absence of both cervix and uterus: Secondary | ICD-10-CM

## 2018-10-25 DIAGNOSIS — Z800 Family history of malignant neoplasm of digestive organs: Secondary | ICD-10-CM | POA: Insufficient documentation

## 2018-10-25 DIAGNOSIS — R2689 Other abnormalities of gait and mobility: Secondary | ICD-10-CM | POA: Diagnosis not present

## 2018-10-25 DIAGNOSIS — I10 Essential (primary) hypertension: Secondary | ICD-10-CM

## 2018-10-25 DIAGNOSIS — Z801 Family history of malignant neoplasm of trachea, bronchus and lung: Secondary | ICD-10-CM | POA: Diagnosis not present

## 2018-10-25 DIAGNOSIS — Z79899 Other long term (current) drug therapy: Secondary | ICD-10-CM | POA: Diagnosis not present

## 2018-10-25 DIAGNOSIS — Z9079 Acquired absence of other genital organ(s): Secondary | ICD-10-CM | POA: Diagnosis not present

## 2018-10-25 DIAGNOSIS — Z86718 Personal history of other venous thrombosis and embolism: Secondary | ICD-10-CM | POA: Insufficient documentation

## 2018-10-25 DIAGNOSIS — M150 Primary generalized (osteo)arthritis: Secondary | ICD-10-CM | POA: Diagnosis not present

## 2018-10-25 DIAGNOSIS — Z90722 Acquired absence of ovaries, bilateral: Secondary | ICD-10-CM | POA: Diagnosis not present

## 2018-10-25 DIAGNOSIS — Z170 Estrogen receptor positive status [ER+]: Secondary | ICD-10-CM

## 2018-10-25 DIAGNOSIS — Z853 Personal history of malignant neoplasm of breast: Secondary | ICD-10-CM | POA: Diagnosis not present

## 2018-10-25 DIAGNOSIS — I130 Hypertensive heart and chronic kidney disease with heart failure and stage 1 through stage 4 chronic kidney disease, or unspecified chronic kidney disease: Secondary | ICD-10-CM | POA: Diagnosis not present

## 2018-10-25 DIAGNOSIS — Z7982 Long term (current) use of aspirin: Secondary | ICD-10-CM | POA: Insufficient documentation

## 2018-10-25 DIAGNOSIS — R531 Weakness: Secondary | ICD-10-CM | POA: Diagnosis not present

## 2018-10-25 DIAGNOSIS — I5030 Unspecified diastolic (congestive) heart failure: Secondary | ICD-10-CM | POA: Diagnosis not present

## 2018-10-25 DIAGNOSIS — R7302 Impaired glucose tolerance (oral): Secondary | ICD-10-CM | POA: Diagnosis not present

## 2018-10-25 LAB — CBC WITH DIFFERENTIAL (CANCER CENTER ONLY)
Abs Immature Granulocytes: 0.02 10*3/uL (ref 0.00–0.07)
Basophils Absolute: 0 10*3/uL (ref 0.0–0.1)
Basophils Relative: 1 %
Eosinophils Absolute: 0.2 10*3/uL (ref 0.0–0.5)
Eosinophils Relative: 3 %
HCT: 31.4 % — ABNORMAL LOW (ref 36.0–46.0)
Hemoglobin: 10 g/dL — ABNORMAL LOW (ref 12.0–15.0)
Immature Granulocytes: 0 %
Lymphocytes Relative: 17 %
Lymphs Abs: 1.3 10*3/uL (ref 0.7–4.0)
MCH: 31.5 pg (ref 26.0–34.0)
MCHC: 31.8 g/dL (ref 30.0–36.0)
MCV: 99.1 fL (ref 80.0–100.0)
Monocytes Absolute: 0.8 10*3/uL (ref 0.1–1.0)
Monocytes Relative: 10 %
Neutro Abs: 5.3 10*3/uL (ref 1.7–7.7)
Neutrophils Relative %: 69 %
Platelet Count: 254 10*3/uL (ref 150–400)
RBC: 3.17 MIL/uL — ABNORMAL LOW (ref 3.87–5.11)
RDW: 13.1 % (ref 11.5–15.5)
WBC Count: 7.7 10*3/uL (ref 4.0–10.5)
nRBC: 0 % (ref 0.0–0.2)

## 2018-10-25 LAB — CMP (CANCER CENTER ONLY)
ALT: 27 U/L (ref 0–44)
AST: 32 U/L (ref 15–41)
Albumin: 3.7 g/dL (ref 3.5–5.0)
Alkaline Phosphatase: 92 U/L (ref 38–126)
Anion gap: 13 (ref 5–15)
BUN: 21 mg/dL (ref 8–23)
CO2: 27 mmol/L (ref 22–32)
Calcium: 10 mg/dL (ref 8.9–10.3)
Chloride: 98 mmol/L (ref 98–111)
Creatinine: 1.31 mg/dL — ABNORMAL HIGH (ref 0.44–1.00)
GFR, Est AFR Am: 44 mL/min — ABNORMAL LOW (ref >60)
GFR, Estimated: 38 mL/min — ABNORMAL LOW (ref >60)
Glucose, Bld: 92 mg/dL (ref 70–99)
Potassium: 3.6 mmol/L (ref 3.5–5.1)
Sodium: 138 mmol/L (ref 135–145)
Total Bilirubin: 0.4 mg/dL (ref 0.3–1.2)
Total Protein: 7.7 g/dL (ref 6.5–8.1)

## 2018-10-25 MED ORDER — TRIAMCINOLONE ACETONIDE 0.1 % EX CREA: 1.0000 "application " | TOPICAL_CREAM | Freq: Two times a day (BID) | CUTANEOUS | 0 refills | Status: DC

## 2018-10-25 MED ORDER — HYDROXYZINE PAMOATE 50 MG PO CAPS: 50.0000 mg | ORAL_CAPSULE | Freq: Every evening | ORAL | 1 refills | Status: DC | PRN

## 2018-10-25 NOTE — Telephone Encounter (Signed)
Gave avs per MD no los

## 2018-10-27 ENCOUNTER — Telehealth: Payer: Self-pay | Admitting: Internal Medicine

## 2018-10-27 DIAGNOSIS — R531 Weakness: Secondary | ICD-10-CM | POA: Diagnosis not present

## 2018-10-27 DIAGNOSIS — I5030 Unspecified diastolic (congestive) heart failure: Secondary | ICD-10-CM | POA: Diagnosis not present

## 2018-10-27 DIAGNOSIS — M150 Primary generalized (osteo)arthritis: Secondary | ICD-10-CM | POA: Diagnosis not present

## 2018-10-27 DIAGNOSIS — R2689 Other abnormalities of gait and mobility: Secondary | ICD-10-CM | POA: Diagnosis not present

## 2018-10-27 DIAGNOSIS — R7302 Impaired glucose tolerance (oral): Secondary | ICD-10-CM | POA: Diagnosis not present

## 2018-10-27 DIAGNOSIS — N189 Chronic kidney disease, unspecified: Secondary | ICD-10-CM | POA: Diagnosis not present

## 2018-10-27 DIAGNOSIS — I130 Hypertensive heart and chronic kidney disease with heart failure and stage 1 through stage 4 chronic kidney disease, or unspecified chronic kidney disease: Secondary | ICD-10-CM | POA: Diagnosis not present

## 2018-10-27 DIAGNOSIS — C50412 Malignant neoplasm of upper-outer quadrant of left female breast: Secondary | ICD-10-CM | POA: Diagnosis not present

## 2018-10-27 NOTE — Telephone Encounter (Signed)
Verbal orders given  

## 2018-10-27 NOTE — Telephone Encounter (Signed)
Copied from [place] [phone]. Topic: Quick Communication - Home Health Verbal Orders >> Oct 27, 2018  1:40 PM Bea Graff, NT wrote: Caller/Agency: [place] Number: [phone] Requesting OT/PT/Skilled Nursing/Social Work: PT  Frequency: To extend for 2 times a week for 2 weeks and 2 times a week for 1 week

## 2018-10-27 NOTE — Telephone Encounter (Signed)
Copied from [place] [phone]. Topic: Quick Communication - See Telephone Encounter >> Oct 27, 2018  2:09 PM Antonieta Iba C wrote: CRM for notification. See Telephone encounter for: 10/27/18.   [place]ovan OT w/ [facility] [phone] (okay to lvm)   Called in to request vo to continue services   Frequency: 1 week 4

## 2018-10-28 DIAGNOSIS — R7302 Impaired glucose tolerance (oral): Secondary | ICD-10-CM | POA: Diagnosis not present

## 2018-10-28 DIAGNOSIS — I5030 Unspecified diastolic (congestive) heart failure: Secondary | ICD-10-CM | POA: Diagnosis not present

## 2018-10-28 DIAGNOSIS — C50412 Malignant neoplasm of upper-outer quadrant of left female breast: Secondary | ICD-10-CM | POA: Diagnosis not present

## 2018-10-28 DIAGNOSIS — N189 Chronic kidney disease, unspecified: Secondary | ICD-10-CM | POA: Diagnosis not present

## 2018-10-28 DIAGNOSIS — R2689 Other abnormalities of gait and mobility: Secondary | ICD-10-CM | POA: Diagnosis not present

## 2018-10-28 DIAGNOSIS — M150 Primary generalized (osteo)arthritis: Secondary | ICD-10-CM | POA: Diagnosis not present

## 2018-10-28 DIAGNOSIS — R531 Weakness: Secondary | ICD-10-CM | POA: Diagnosis not present

## 2018-10-28 DIAGNOSIS — I130 Hypertensive heart and chronic kidney disease with heart failure and stage 1 through stage 4 chronic kidney disease, or unspecified chronic kidney disease: Secondary | ICD-10-CM | POA: Diagnosis not present

## 2018-10-28 NOTE — Telephone Encounter (Signed)
Verbal orders  

## 2018-11-01 DIAGNOSIS — I5030 Unspecified diastolic (congestive) heart failure: Secondary | ICD-10-CM | POA: Diagnosis not present

## 2018-11-01 DIAGNOSIS — N189 Chronic kidney disease, unspecified: Secondary | ICD-10-CM | POA: Diagnosis not present

## 2018-11-01 DIAGNOSIS — R7302 Impaired glucose tolerance (oral): Secondary | ICD-10-CM | POA: Diagnosis not present

## 2018-11-01 DIAGNOSIS — I130 Hypertensive heart and chronic kidney disease with heart failure and stage 1 through stage 4 chronic kidney disease, or unspecified chronic kidney disease: Secondary | ICD-10-CM | POA: Diagnosis not present

## 2018-11-01 DIAGNOSIS — M150 Primary generalized (osteo)arthritis: Secondary | ICD-10-CM | POA: Diagnosis not present

## 2018-11-01 DIAGNOSIS — C50412 Malignant neoplasm of upper-outer quadrant of left female breast: Secondary | ICD-10-CM | POA: Diagnosis not present

## 2018-11-03 ENCOUNTER — Other Ambulatory Visit: Payer: Self-pay | Admitting: Internal Medicine

## 2018-11-03 DIAGNOSIS — I130 Hypertensive heart and chronic kidney disease with heart failure and stage 1 through stage 4 chronic kidney disease, or unspecified chronic kidney disease: Secondary | ICD-10-CM | POA: Diagnosis not present

## 2018-11-03 DIAGNOSIS — I5030 Unspecified diastolic (congestive) heart failure: Secondary | ICD-10-CM | POA: Diagnosis not present

## 2018-11-03 DIAGNOSIS — C50412 Malignant neoplasm of upper-outer quadrant of left female breast: Secondary | ICD-10-CM | POA: Diagnosis not present

## 2018-11-03 DIAGNOSIS — M150 Primary generalized (osteo)arthritis: Secondary | ICD-10-CM | POA: Diagnosis not present

## 2018-11-03 DIAGNOSIS — R7302 Impaired glucose tolerance (oral): Secondary | ICD-10-CM | POA: Diagnosis not present

## 2018-11-03 DIAGNOSIS — N189 Chronic kidney disease, unspecified: Secondary | ICD-10-CM | POA: Diagnosis not present

## 2018-11-03 NOTE — Telephone Encounter (Signed)
Requested medication (s) are due for refill today: yes  Requested medication (s) are on the active medication list: yes  Last refill:  01/31/18 #60 with 5 refills  Future visit scheduled: yes  Notes to clinic:  Unable to refill per protocol    Requested Prescriptions  Pending Prescriptions Disp Refills   iron polysaccharides (NU-IRON) 150 MG capsule 60 capsule 5    Sig: Take 1 capsule (150 mg total) by mouth 2 (two) times daily.     Off-Protocol Failed - 11/03/2018  5:29 PM      Failed - Medication not assigned to a protocol, review manually.      Passed - Valid encounter within last 12 months    Recent Outpatient Visits          2 months ago Encounter for well adult exam with abnormal findings   [place] John, James W, MD   5 months ago Mild intermittent asthma with exacerbation   [place] John, James W, MD   6 months ago Asthmatic bronchitis without complication, unspecified asthma severity, unspecified whether persistent   [place] John, James W, MD   9 months ago Acute bilateral deep vein thrombosis (DVT) of femoral veins [facility])   [place] John, James W, MD   9 months ago Encounter for well adult exam with abnormal findings   [place], James W, MD      Future Appointments            In 3 weeks Jenny Reichmann Hunt Oris, MD [place], [facility]

## 2018-11-03 NOTE — Telephone Encounter (Signed)
Copied from [place] [id number] [phone]. Topic: Quick Communication - Rx Refill/Question >> Nov 03, 2018  4:50 PM Sheran Luz wrote: Medication: ferrex 150  Patient is requesting a refill of this medication, stating Dr. Jenny Reichmann has prescribed it for her in the past. Please advise.   Preferred Pharmacy (with phone number or street name):[place], [place] - [address]  [phone] (Phone) [phone] (Fax)

## 2018-11-03 NOTE — Telephone Encounter (Signed)
Called pt to confirm pt was requesting refill of iron pill currently listed in chart as Nu-iron 150 mg capsule . Last prescription was written on 01/31/18 #60 with 5 refills. Pt states she just recently ran out of the medication around 10/25/18.

## 2018-11-04 DIAGNOSIS — R7302 Impaired glucose tolerance (oral): Secondary | ICD-10-CM | POA: Diagnosis not present

## 2018-11-04 DIAGNOSIS — N189 Chronic kidney disease, unspecified: Secondary | ICD-10-CM | POA: Diagnosis not present

## 2018-11-04 DIAGNOSIS — I5030 Unspecified diastolic (congestive) heart failure: Secondary | ICD-10-CM | POA: Diagnosis not present

## 2018-11-04 DIAGNOSIS — C50412 Malignant neoplasm of upper-outer quadrant of left female breast: Secondary | ICD-10-CM | POA: Diagnosis not present

## 2018-11-04 DIAGNOSIS — I130 Hypertensive heart and chronic kidney disease with heart failure and stage 1 through stage 4 chronic kidney disease, or unspecified chronic kidney disease: Secondary | ICD-10-CM | POA: Diagnosis not present

## 2018-11-04 DIAGNOSIS — M150 Primary generalized (osteo)arthritis: Secondary | ICD-10-CM | POA: Diagnosis not present

## 2018-11-04 MED ORDER — POLYSACCHARIDE IRON COMPLEX 150 MG PO CAPS: 150.0000 mg | ORAL_CAPSULE | Freq: Two times a day (BID) | ORAL | 5 refills | Status: DC

## 2018-11-04 NOTE — Telephone Encounter (Signed)
Reviewed chart pt is up-to-date sent refills to [facility].../lmb  

## 2018-11-09 DIAGNOSIS — C50412 Malignant neoplasm of upper-outer quadrant of left female breast: Secondary | ICD-10-CM | POA: Diagnosis not present

## 2018-11-09 DIAGNOSIS — M150 Primary generalized (osteo)arthritis: Secondary | ICD-10-CM | POA: Diagnosis not present

## 2018-11-09 DIAGNOSIS — I5030 Unspecified diastolic (congestive) heart failure: Secondary | ICD-10-CM | POA: Diagnosis not present

## 2018-11-09 DIAGNOSIS — N189 Chronic kidney disease, unspecified: Secondary | ICD-10-CM | POA: Diagnosis not present

## 2018-11-09 DIAGNOSIS — R7302 Impaired glucose tolerance (oral): Secondary | ICD-10-CM | POA: Diagnosis not present

## 2018-11-09 DIAGNOSIS — I130 Hypertensive heart and chronic kidney disease with heart failure and stage 1 through stage 4 chronic kidney disease, or unspecified chronic kidney disease: Secondary | ICD-10-CM | POA: Diagnosis not present

## 2018-11-10 DIAGNOSIS — I5030 Unspecified diastolic (congestive) heart failure: Secondary | ICD-10-CM | POA: Diagnosis not present

## 2018-11-10 DIAGNOSIS — N189 Chronic kidney disease, unspecified: Secondary | ICD-10-CM | POA: Diagnosis not present

## 2018-11-10 DIAGNOSIS — M150 Primary generalized (osteo)arthritis: Secondary | ICD-10-CM | POA: Diagnosis not present

## 2018-11-10 DIAGNOSIS — R7302 Impaired glucose tolerance (oral): Secondary | ICD-10-CM | POA: Diagnosis not present

## 2018-11-10 DIAGNOSIS — I130 Hypertensive heart and chronic kidney disease with heart failure and stage 1 through stage 4 chronic kidney disease, or unspecified chronic kidney disease: Secondary | ICD-10-CM | POA: Diagnosis not present

## 2018-11-10 DIAGNOSIS — C50412 Malignant neoplasm of upper-outer quadrant of left female breast: Secondary | ICD-10-CM | POA: Diagnosis not present

## 2018-11-11 DIAGNOSIS — R7302 Impaired glucose tolerance (oral): Secondary | ICD-10-CM | POA: Diagnosis not present

## 2018-11-11 DIAGNOSIS — I130 Hypertensive heart and chronic kidney disease with heart failure and stage 1 through stage 4 chronic kidney disease, or unspecified chronic kidney disease: Secondary | ICD-10-CM | POA: Diagnosis not present

## 2018-11-11 DIAGNOSIS — C50412 Malignant neoplasm of upper-outer quadrant of left female breast: Secondary | ICD-10-CM | POA: Diagnosis not present

## 2018-11-11 DIAGNOSIS — M150 Primary generalized (osteo)arthritis: Secondary | ICD-10-CM | POA: Diagnosis not present

## 2018-11-11 DIAGNOSIS — N189 Chronic kidney disease, unspecified: Secondary | ICD-10-CM | POA: Diagnosis not present

## 2018-11-11 DIAGNOSIS — I5030 Unspecified diastolic (congestive) heart failure: Secondary | ICD-10-CM | POA: Diagnosis not present

## 2018-11-16 DIAGNOSIS — C50412 Malignant neoplasm of upper-outer quadrant of left female breast: Secondary | ICD-10-CM | POA: Diagnosis not present

## 2018-11-16 DIAGNOSIS — R7302 Impaired glucose tolerance (oral): Secondary | ICD-10-CM | POA: Diagnosis not present

## 2018-11-16 DIAGNOSIS — M150 Primary generalized (osteo)arthritis: Secondary | ICD-10-CM | POA: Diagnosis not present

## 2018-11-16 DIAGNOSIS — I5030 Unspecified diastolic (congestive) heart failure: Secondary | ICD-10-CM | POA: Diagnosis not present

## 2018-11-16 DIAGNOSIS — I130 Hypertensive heart and chronic kidney disease with heart failure and stage 1 through stage 4 chronic kidney disease, or unspecified chronic kidney disease: Secondary | ICD-10-CM | POA: Diagnosis not present

## 2018-11-16 DIAGNOSIS — N189 Chronic kidney disease, unspecified: Secondary | ICD-10-CM | POA: Diagnosis not present

## 2018-11-17 ENCOUNTER — Telehealth: Payer: Self-pay | Admitting: Internal Medicine

## 2018-11-17 DIAGNOSIS — I5030 Unspecified diastolic (congestive) heart failure: Secondary | ICD-10-CM | POA: Diagnosis not present

## 2018-11-17 DIAGNOSIS — I130 Hypertensive heart and chronic kidney disease with heart failure and stage 1 through stage 4 chronic kidney disease, or unspecified chronic kidney disease: Secondary | ICD-10-CM | POA: Diagnosis not present

## 2018-11-17 DIAGNOSIS — N189 Chronic kidney disease, unspecified: Secondary | ICD-10-CM | POA: Diagnosis not present

## 2018-11-17 DIAGNOSIS — M150 Primary generalized (osteo)arthritis: Secondary | ICD-10-CM | POA: Diagnosis not present

## 2018-11-17 DIAGNOSIS — R7302 Impaired glucose tolerance (oral): Secondary | ICD-10-CM | POA: Diagnosis not present

## 2018-11-17 DIAGNOSIS — C50412 Malignant neoplasm of upper-outer quadrant of left female breast: Secondary | ICD-10-CM | POA: Diagnosis not present

## 2018-11-17 NOTE — Telephone Encounter (Signed)
Copied from CRM #[id number]. Topic: Quick Communication - See Telephone Encounter °>> Nov 17, 2018  9:55 AM Johnson, Shiquita C wrote: °CRM for notification. See Telephone encounter for: 11/17/18. ° °[place]- OT - [facility] - [phone] * Okay to lvm  ° °Discharged from services today. She has met her goals. °

## 2018-11-23 DIAGNOSIS — C50412 Malignant neoplasm of upper-outer quadrant of left female breast: Secondary | ICD-10-CM | POA: Diagnosis not present

## 2018-11-23 DIAGNOSIS — I130 Hypertensive heart and chronic kidney disease with heart failure and stage 1 through stage 4 chronic kidney disease, or unspecified chronic kidney disease: Secondary | ICD-10-CM | POA: Diagnosis not present

## 2018-11-23 DIAGNOSIS — R7302 Impaired glucose tolerance (oral): Secondary | ICD-10-CM | POA: Diagnosis not present

## 2018-11-23 DIAGNOSIS — N189 Chronic kidney disease, unspecified: Secondary | ICD-10-CM | POA: Diagnosis not present

## 2018-11-23 DIAGNOSIS — M150 Primary generalized (osteo)arthritis: Secondary | ICD-10-CM | POA: Diagnosis not present

## 2018-11-23 DIAGNOSIS — I5030 Unspecified diastolic (congestive) heart failure: Secondary | ICD-10-CM | POA: Diagnosis not present

## 2018-11-28 ENCOUNTER — Other Ambulatory Visit: Payer: Self-pay | Admitting: Internal Medicine

## 2018-11-29 ENCOUNTER — Other Ambulatory Visit: Payer: Self-pay | Admitting: Internal Medicine

## 2018-11-30 ENCOUNTER — Ambulatory Visit (INDEPENDENT_AMBULATORY_CARE_PROVIDER_SITE_OTHER): Payer: Medicare HMO | Admitting: Internal Medicine

## 2018-11-30 ENCOUNTER — Encounter: Payer: Self-pay | Admitting: Internal Medicine

## 2018-11-30 VITALS — BP 146/84 | HR 74 | Temp 98.3°F | Ht 63.0 in | Wt 158.0 lb

## 2018-11-30 DIAGNOSIS — M069 Rheumatoid arthritis, unspecified: Secondary | ICD-10-CM

## 2018-11-30 DIAGNOSIS — R7302 Impaired glucose tolerance (oral): Secondary | ICD-10-CM | POA: Diagnosis not present

## 2018-11-30 DIAGNOSIS — J4531 Mild persistent asthma with (acute) exacerbation: Secondary | ICD-10-CM | POA: Diagnosis not present

## 2018-11-30 DIAGNOSIS — I10 Essential (primary) hypertension: Secondary | ICD-10-CM

## 2018-11-30 MED ORDER — METHYLPREDNISOLONE ACETATE 80 MG/ML IJ SUSP
80.0000 mg | Freq: Once | INTRAMUSCULAR | Status: AC
  Administered 2018-11-30: 80 mg via INTRAMUSCULAR

## 2018-11-30 MED ORDER — FLUTICASONE-SALMETEROL 250-50 MCG/DOSE IN AEPB: 1.0000 | INHALATION_SPRAY | Freq: Two times a day (BID) | RESPIRATORY_TRACT | 3 refills | Status: DC

## 2018-11-30 MED ORDER — PREDNISONE 10 MG PO TABS: ORAL_TABLET | ORAL | 0 refills | Status: DC

## 2018-11-30 NOTE — Addendum Note (Signed)
Addended by: Juliet Rude on: 11/30/2018 09:19 AM   Modules accepted: Orders

## 2018-11-30 NOTE — Assessment & Plan Note (Signed)
Mild persistent, to change the dulera (she is out) to advair generic, and should improve with steroid tx today as well

## 2018-11-30 NOTE — Assessment & Plan Note (Signed)
stable overall by history and exam, recent data reviewed with pt, and pt to continue medical treatment as before,  to f/u any worsening symptoms or concerns  

## 2018-11-30 NOTE — Patient Instructions (Addendum)
You had the steroid shot today  Please take all new medication as prescribed - the prednisone  OK to change the [facility] to Advair generic  You will be contacted regarding the referral for: Dr Estanislado Pandy of Rheumatology  Please continue all other medications as before, and refills have been done if requested.  Please have the pharmacy call with any other refills you may need.  Please continue your efforts at being more active, low cholesterol diet, and weight control.  Please keep your appointments with your specialists as you may have planned  Please return in 6 months, or sooner if needed

## 2018-11-30 NOTE — Progress Notes (Signed)
Subjective:    Patient ID: [name], female    DOB: 1936/09/10, 83 y.o.   MRN: [id number]  HPI  Here to f/u after finished Home PT and seemed to be doing much better, walks slowly with cane but no recent falls;  Has had persistent joint pain however that may or may not be aggrevated, actually feels the best today than the last 3 wks, denies swelling, but essentially "hard pain" from hands to the elbows and shoulders, even hard to pull on underwear, with stiffness in the AM and better with warm water.  Has hx of bilat TKR and on prior hx of recent knee issues, but did go shopping a week ago and had a pain to the left knee anteriorly that she had to sit and let the daughter do the shopping.  PT suggested seeing rheumatology.  Pt stated didn't feel she was helped with seeing the PA at [facility] in 2018 after 3 visit.  Also incidentally wheezing/sob worse recently as dulera no longer covered with her insurance, needs different controller Past Medical History:  Diagnosis Date  . Allergic rhinitis 10/30/2016  . Anemia   . Asthma   . Breast cancer of upper-outer quadrant of left female breast ([place]) 11/08/2013   ER/PR+ Her2- Left IDC   . Chronic renal insufficiency   . Chronic rhinitis   . Colon polyp   . Diastolic dysfunction 6/64/4034  . DJD (degenerative joint disease)   . Dyspnea   . Full dentures   . GERD (gastroesophageal reflux disease)   . Hearing loss   . Hypertension   . Hyponatremia   . Impaired glucose tolerance 07/18/2014  . Memory loss   . Morbid obesity ([place])   . Poor circulation   . Vertigo   . Wears glasses    Past Surgical History:  Procedure Laterality Date  . ABDOMINAL HYSTERECTOMY    . BREAST LUMPECTOMY WITH NEEDLE LOCALIZATION AND AXILLARY SENTINEL LYMPH NODE BX Left 12/04/2013   Procedure: BREAST LUMPECTOMY WITH NEEDLE LOCALIZATION AND AXILLARY SENTINEL LYMPH NODE BX;  Surgeon: Shann Medal, MD;  Location: [place];  Service: General;   Laterality: Left;  . CATARACT EXTRACTION  2009   rt  . COLONOSCOPY    . EYE SURGERY Bilateral    cataract surgery  . KNEE ARTHROSCOPY     both  . LUMBAR LAMINECTOMY/DECOMPRESSION MICRODISCECTOMY Left 12/23/2017   Procedure: Left Lumbar One-Two Laminectomy with microdiscectomy;  Surgeon: Eustace Moore, MD;  Location: [place];  Service: Neurosurgery;  Laterality: Left;  Left L1-2 Laminectomy with microdiscectomy  . TONSILLECTOMY    . TOTAL KNEE ARTHROPLASTY  2002   rt  . TOTAL KNEE ARTHROPLASTY  2003   left  . VESICOVAGINAL FISTULA CLOSURE W/ TAH  1980    reports that she has never smoked. She has never used smokeless tobacco. She reports that she does not drink alcohol or use drugs. family history includes Colon cancer in her mother; Heart attack (age of onset: 84) in her son; Heart disease in her unknown relative; Lung cancer in her brother; Stomach cancer in her mother. Allergies  Allergen Reactions  . Hydrocodone Itching  . Tizanidine Other (See Comments)    Dizzy and fall  . Iron Hives and Other (See Comments)    Whelps, bad constipation   Current Outpatient Medications on File Prior to Visit  Medication Sig Dispense Refill  . albuterol (PROVENTIL HFA;VENTOLIN HFA) 108 (90 Base) MCG/ACT inhaler Inhale 2  puffs into the lungs every 6 (six) hours as needed for wheezing or shortness of breath. 1 Inhaler 11  . amLODipine (NORVASC) 5 MG tablet TAKE 1 TABLET BY MOUTH EVERY DAY 90 tablet 1  . aspirin 81 MG tablet Take 81 mg by mouth daily.      [name] erythromycin ophthalmic ointment Place 1 application into the left eye 4 (four) times daily. For 10 days 3.5 g 0  . FERREX 150 150 MG capsule TAKE 1 CAPSULE BY MOUTH TWICE A DAY 60 capsule 1  . furosemide (LASIX) 20 MG tablet Take 1 tablet (20 mg total) by mouth daily. 90 tablet 1  . gabapentin (NEURONTIN) 100 MG capsule TAKE ONE CAPSULE BY MOUTH 3 TIMES A DAY 90 capsule 2  . hydrOXYzine (VISTARIL) 50 MG capsule Take 1 capsule (50 mg total) by  mouth at bedtime as needed for itching. 30 capsule 1  . losartan-hydrochlorothiazide (HYZAAR) 100-25 MG tablet Take 1 tablet by mouth daily. 90 tablet 1  . meclizine (ANTIVERT) 12.5 MG tablet TAKE 1 TABLET THREE TIMES DAILY AS NEEDED  FOR  DIZZINESS (Patient taking differently: TAKE 12.5 MG THREE TIMES DAILY AS NEEDED  FOR  DIZZINESS) 270 tablet 0  . methocarbamol (ROBAXIN) 500 MG tablet Take 1 tablet (500 mg total) by mouth every 8 (eight) hours as needed for muscle spasms. 10 tablet 0  . mometasone-formoterol (DULERA) 200-5 MCG/ACT AERO Inhale 2 puffs into the lungs 2 (two) times daily. 13 g 11  . montelukast (SINGULAIR) 10 MG tablet Take 1 tablet (10 mg total) by mouth daily. 90 tablet 3  . Multiple Vitamins-Minerals (CENTRUM SILVER PO) Take 1 tablet by mouth daily.      . pantoprazole (PROTONIX) 40 MG tablet Take 1 tablet (40 mg total) by mouth daily. 90 tablet 1  . Potassium Gluconate 550 (90 K) MG TABS Take 550 mg by mouth daily.     . Simethicone (GAS-X PO) Take 1 tablet by mouth daily as needed (for gas).     . solifenacin (VESICARE) 5 MG tablet Take 1 tablet (5 mg total) by mouth daily. 90 tablet 1  . tizanidine (ZANAFLEX) 2 MG capsule   1  . triamcinolone (NASACORT AQ) 55 MCG/ACT AERO nasal inhaler Place 2 sprays into the nose daily. 1 Inhaler 12  . triamcinolone cream (KENALOG) 0.1 % Apply 1 application topically 2 (two) times daily. 30 g 0  . Vitamin D, Ergocalciferol, (DRISDOL) 50000 units CAPS capsule Take 1 capsule (50,000 Units total) by mouth every 7 (seven) days. (Patient taking differently: Take 50,000 Units by mouth every 7 (seven) days. On Friday) 12 capsule 0   No current facility-administered medications on file prior to visit.    Review of Systems  Constitutional: Negative for other unusual diaphoresis or sweats HENT: Negative for ear discharge or swelling Eyes: Negative for other worsening visual disturbances Respiratory: Negative for stridor or other swelling    Gastrointestinal: Negative for worsening distension or other blood Genitourinary: Negative for retention or other urinary change Musculoskeletal: Negative for other MSK pain or swelling Skin: Negative for color change or other new lesions Neurological: Negative for worsening tremors and other numbness  Psychiatric/Behavioral: Negative for worsening agitation or other fatigue All other system neg per pt    Objective:   Physical Exam BP (!) 146/84   Pulse 74   Temp 98.3 F (36.8 C) (Oral)   Ht '5\' 3"'$  (1.6 m)   Wt 158 lb (71.7 kg)   SpO2 95%  BMI 27.99 kg/m  VS noted,  Constitutional: Pt appears in NAD HENT: Head: NCAT.  Right Ear: External ear normal.  Left Ear: External ear normal.  Eyes: . Pupils are equal, round, and reactive to light. Conjunctivae and EOM are normal Nose: without d/c or deformity Neck: Neck supple. Gross normal ROM Cardiovascular: Normal rate and regular rhythm.   Pulmonary/Chest: Effort normal and breath sounds without rales but with few trace wheezing.  No synovitis, but with early RA changes to the MCPs with mild ulnar deviation Neurological: Pt is alert. At baseline orientation, motor grossly intact Skin: Skin is warm. No rashes, other new lesions, no LE edema Psychiatric: Pt behavior is normal without agitation  No other exam findings Lab Results  Component Value Date   WBC 7.7 10/25/2018   HGB 10.0 (L) 10/25/2018   HCT 31.4 (L) 10/25/2018   PLT 254 10/25/2018   GLUCOSE 92 10/25/2018   CHOL 173 08/30/2018   TRIG 80.0 08/30/2018   HDL 69.80 08/30/2018   LDLCALC 87 08/30/2018   ALT 27 10/25/2018   AST 32 10/25/2018   NA 138 10/25/2018   K 3.6 10/25/2018   CL 98 10/25/2018   CREATININE 1.31 (H) 10/25/2018   BUN 21 10/25/2018   CO2 27 10/25/2018   TSH 2.42 08/30/2018   INR 1.35 01/17/2018   HGBA1C 5.1 08/30/2018       Assessment & Plan:

## 2018-11-30 NOTE — Assessment & Plan Note (Signed)
Mild with pain and stiffness more than expected for OA pain, for depomedrol 80 IM, predpac asd, refer Dr Estanislado Pandy Rheumatology

## 2018-12-06 ENCOUNTER — Other Ambulatory Visit: Payer: Self-pay | Admitting: Internal Medicine

## 2019-01-03 ENCOUNTER — Other Ambulatory Visit: Payer: Self-pay | Admitting: Internal Medicine

## 2019-01-12 ENCOUNTER — Other Ambulatory Visit: Payer: Self-pay | Admitting: Internal Medicine

## 2019-01-12 DIAGNOSIS — M549 Dorsalgia, unspecified: Secondary | ICD-10-CM | POA: Diagnosis not present

## 2019-01-12 DIAGNOSIS — M25519 Pain in unspecified shoulder: Secondary | ICD-10-CM | POA: Diagnosis not present

## 2019-01-12 DIAGNOSIS — M79671 Pain in right foot: Secondary | ICD-10-CM | POA: Diagnosis not present

## 2019-01-12 DIAGNOSIS — M1711 Unilateral primary osteoarthritis, right knee: Secondary | ICD-10-CM | POA: Diagnosis not present

## 2019-01-12 DIAGNOSIS — M19012 Primary osteoarthritis, left shoulder: Secondary | ICD-10-CM | POA: Diagnosis not present

## 2019-01-12 DIAGNOSIS — M19041 Primary osteoarthritis, right hand: Secondary | ICD-10-CM | POA: Diagnosis not present

## 2019-01-12 DIAGNOSIS — Z853 Personal history of malignant neoplasm of breast: Secondary | ICD-10-CM | POA: Diagnosis not present

## 2019-01-12 DIAGNOSIS — M2011 Hallux valgus (acquired), right foot: Secondary | ICD-10-CM | POA: Diagnosis not present

## 2019-01-12 DIAGNOSIS — M19072 Primary osteoarthritis, left ankle and foot: Secondary | ICD-10-CM | POA: Diagnosis not present

## 2019-01-12 DIAGNOSIS — M25511 Pain in right shoulder: Secondary | ICD-10-CM | POA: Diagnosis not present

## 2019-01-12 DIAGNOSIS — M19042 Primary osteoarthritis, left hand: Secondary | ICD-10-CM | POA: Diagnosis not present

## 2019-01-12 DIAGNOSIS — M19011 Primary osteoarthritis, right shoulder: Secondary | ICD-10-CM | POA: Diagnosis not present

## 2019-01-12 DIAGNOSIS — M79642 Pain in left hand: Secondary | ICD-10-CM | POA: Diagnosis not present

## 2019-01-12 DIAGNOSIS — M79643 Pain in unspecified hand: Secondary | ICD-10-CM | POA: Diagnosis not present

## 2019-01-12 DIAGNOSIS — M1990 Unspecified osteoarthritis, unspecified site: Secondary | ICD-10-CM | POA: Diagnosis not present

## 2019-01-12 DIAGNOSIS — M79641 Pain in right hand: Secondary | ICD-10-CM | POA: Diagnosis not present

## 2019-01-12 LAB — HM MAMMOGRAPHY

## 2019-01-19 ENCOUNTER — Other Ambulatory Visit: Payer: Self-pay | Admitting: Internal Medicine

## 2019-01-23 DIAGNOSIS — M79643 Pain in unspecified hand: Secondary | ICD-10-CM | POA: Diagnosis not present

## 2019-01-23 DIAGNOSIS — M1990 Unspecified osteoarthritis, unspecified site: Secondary | ICD-10-CM | POA: Diagnosis not present

## 2019-01-23 DIAGNOSIS — M7580 Other shoulder lesions, unspecified shoulder: Secondary | ICD-10-CM | POA: Diagnosis not present

## 2019-01-23 DIAGNOSIS — M7500 Adhesive capsulitis of unspecified shoulder: Secondary | ICD-10-CM | POA: Diagnosis not present

## 2019-01-23 DIAGNOSIS — M549 Dorsalgia, unspecified: Secondary | ICD-10-CM | POA: Diagnosis not present

## 2019-01-23 DIAGNOSIS — M25519 Pain in unspecified shoulder: Secondary | ICD-10-CM | POA: Diagnosis not present

## 2019-02-01 ENCOUNTER — Telehealth: Payer: Self-pay | Admitting: Internal Medicine

## 2019-02-01 MED ORDER — MECLIZINE HCL 12.5 MG PO TABS: ORAL_TABLET | ORAL | 0 refills | Status: DC

## 2019-02-01 NOTE — Telephone Encounter (Signed)
Copied from [place] [phone]. Topic: Quick Communication - Rx Refill/Question >> Feb 01, 2019 10:22 AM Scherrie Gerlach wrote: Medication: meclizine (ANTIVERT) 12.5 MG tablet  Pt states sometimes in the morning she has a nagging headache and takes this.  Lately she has been having and sometimes in the afternoon as well.  Pt has not had this med in a year and a half. Pt states her headache stops when she takes this med Request refill [id number] [place] [place] [phone] (Phone) [phone] (Fax)

## 2019-02-21 DIAGNOSIS — M7500 Adhesive capsulitis of unspecified shoulder: Secondary | ICD-10-CM | POA: Diagnosis not present

## 2019-02-21 DIAGNOSIS — M1990 Unspecified osteoarthritis, unspecified site: Secondary | ICD-10-CM | POA: Diagnosis not present

## 2019-02-21 DIAGNOSIS — M25519 Pain in unspecified shoulder: Secondary | ICD-10-CM | POA: Diagnosis not present

## 2019-02-21 DIAGNOSIS — M353 Polymyalgia rheumatica: Secondary | ICD-10-CM | POA: Diagnosis not present

## 2019-02-21 DIAGNOSIS — Z79899 Other long term (current) drug therapy: Secondary | ICD-10-CM | POA: Diagnosis not present

## 2019-02-21 DIAGNOSIS — R700 Elevated erythrocyte sedimentation rate: Secondary | ICD-10-CM | POA: Diagnosis not present

## 2019-02-21 DIAGNOSIS — M7580 Other shoulder lesions, unspecified shoulder: Secondary | ICD-10-CM | POA: Diagnosis not present

## 2019-02-21 DIAGNOSIS — M549 Dorsalgia, unspecified: Secondary | ICD-10-CM | POA: Diagnosis not present

## 2019-02-21 DIAGNOSIS — M79643 Pain in unspecified hand: Secondary | ICD-10-CM | POA: Diagnosis not present

## 2019-02-23 ENCOUNTER — Other Ambulatory Visit: Payer: Self-pay | Admitting: Internal Medicine

## 2019-03-01 ENCOUNTER — Other Ambulatory Visit: Payer: Self-pay | Admitting: Internal Medicine

## 2019-03-24 DIAGNOSIS — M1990 Unspecified osteoarthritis, unspecified site: Secondary | ICD-10-CM | POA: Diagnosis not present

## 2019-03-24 DIAGNOSIS — R700 Elevated erythrocyte sedimentation rate: Secondary | ICD-10-CM | POA: Diagnosis not present

## 2019-03-24 DIAGNOSIS — M1180 Other specified crystal arthropathies, unspecified site: Secondary | ICD-10-CM | POA: Diagnosis not present

## 2019-03-24 DIAGNOSIS — M79643 Pain in unspecified hand: Secondary | ICD-10-CM | POA: Diagnosis not present

## 2019-03-24 DIAGNOSIS — Z79899 Other long term (current) drug therapy: Secondary | ICD-10-CM | POA: Diagnosis not present

## 2019-03-24 DIAGNOSIS — M25519 Pain in unspecified shoulder: Secondary | ICD-10-CM | POA: Diagnosis not present

## 2019-03-24 DIAGNOSIS — M7500 Adhesive capsulitis of unspecified shoulder: Secondary | ICD-10-CM | POA: Diagnosis not present

## 2019-04-12 DIAGNOSIS — R700 Elevated erythrocyte sedimentation rate: Secondary | ICD-10-CM | POA: Diagnosis not present

## 2019-04-12 DIAGNOSIS — M1180 Other specified crystal arthropathies, unspecified site: Secondary | ICD-10-CM | POA: Diagnosis not present

## 2019-04-12 DIAGNOSIS — M2550 Pain in unspecified joint: Secondary | ICD-10-CM | POA: Diagnosis not present

## 2019-04-12 DIAGNOSIS — M79643 Pain in unspecified hand: Secondary | ICD-10-CM | POA: Diagnosis not present

## 2019-04-12 DIAGNOSIS — M1990 Unspecified osteoarthritis, unspecified site: Secondary | ICD-10-CM | POA: Diagnosis not present

## 2019-04-12 DIAGNOSIS — M25519 Pain in unspecified shoulder: Secondary | ICD-10-CM | POA: Diagnosis not present

## 2019-04-12 DIAGNOSIS — M7500 Adhesive capsulitis of unspecified shoulder: Secondary | ICD-10-CM | POA: Diagnosis not present

## 2019-04-12 DIAGNOSIS — Z79899 Other long term (current) drug therapy: Secondary | ICD-10-CM | POA: Diagnosis not present

## 2019-04-13 ENCOUNTER — Other Ambulatory Visit: Payer: Self-pay

## 2019-04-13 ENCOUNTER — Encounter: Payer: Self-pay | Admitting: Internal Medicine

## 2019-04-13 ENCOUNTER — Other Ambulatory Visit (INDEPENDENT_AMBULATORY_CARE_PROVIDER_SITE_OTHER): Payer: Medicare HMO

## 2019-04-13 ENCOUNTER — Ambulatory Visit: Payer: Medicare HMO | Admitting: Internal Medicine

## 2019-04-13 ENCOUNTER — Ambulatory Visit (INDEPENDENT_AMBULATORY_CARE_PROVIDER_SITE_OTHER): Payer: Medicare HMO | Admitting: Internal Medicine

## 2019-04-13 VITALS — BP 126/82 | HR 80 | Temp 98.4°F | Ht 63.0 in | Wt 149.0 lb

## 2019-04-13 DIAGNOSIS — R7302 Impaired glucose tolerance (oral): Secondary | ICD-10-CM

## 2019-04-13 DIAGNOSIS — E559 Vitamin D deficiency, unspecified: Secondary | ICD-10-CM | POA: Diagnosis not present

## 2019-04-13 DIAGNOSIS — R197 Diarrhea, unspecified: Secondary | ICD-10-CM | POA: Diagnosis not present

## 2019-04-13 DIAGNOSIS — Z0001 Encounter for general adult medical examination with abnormal findings: Secondary | ICD-10-CM | POA: Diagnosis not present

## 2019-04-13 DIAGNOSIS — R748 Abnormal levels of other serum enzymes: Secondary | ICD-10-CM

## 2019-04-13 DIAGNOSIS — I10 Essential (primary) hypertension: Secondary | ICD-10-CM | POA: Diagnosis not present

## 2019-04-13 DIAGNOSIS — M069 Rheumatoid arthritis, unspecified: Secondary | ICD-10-CM

## 2019-04-13 DIAGNOSIS — E538 Deficiency of other specified B group vitamins: Secondary | ICD-10-CM

## 2019-04-13 DIAGNOSIS — R531 Weakness: Secondary | ICD-10-CM

## 2019-04-13 DIAGNOSIS — R269 Unspecified abnormalities of gait and mobility: Secondary | ICD-10-CM | POA: Diagnosis not present

## 2019-04-13 LAB — CBC WITH DIFFERENTIAL/PLATELET
Basophils Absolute: 0 10*3/uL (ref 0.0–0.1)
Basophils Relative: 0.4 % (ref 0.0–3.0)
Eosinophils Absolute: 0.1 10*3/uL (ref 0.0–0.7)
Eosinophils Relative: 0.5 % (ref 0.0–5.0)
HCT: 33.3 % — ABNORMAL LOW (ref 36.0–46.0)
Hemoglobin: 11.1 g/dL — ABNORMAL LOW (ref 12.0–15.0)
Lymphocytes Relative: 15.3 % (ref 12.0–46.0)
Lymphs Abs: 1.8 10*3/uL (ref 0.7–4.0)
MCHC: 33.4 g/dL (ref 30.0–36.0)
MCV: 98.3 fl (ref 78.0–100.0)
Monocytes Absolute: 1 10*3/uL (ref 0.1–1.0)
Monocytes Relative: 8.3 % (ref 3.0–12.0)
Neutro Abs: 9 10*3/uL — ABNORMAL HIGH (ref 1.4–7.7)
Neutrophils Relative %: 75.5 % (ref 43.0–77.0)
Platelets: 241 10*3/uL (ref 150.0–400.0)
RBC: 3.38 Mil/uL — ABNORMAL LOW (ref 3.87–5.11)
RDW: 15.6 % — ABNORMAL HIGH (ref 11.5–15.5)
WBC: 12 10*3/uL — ABNORMAL HIGH (ref 4.0–10.5)

## 2019-04-13 LAB — URINALYSIS, ROUTINE W REFLEX MICROSCOPIC
Bilirubin Urine: NEGATIVE
Ketones, ur: NEGATIVE
Nitrite: NEGATIVE
Specific Gravity, Urine: 1.01 (ref 1.000–1.030)
Total Protein, Urine: NEGATIVE
Urine Glucose: NEGATIVE
Urobilinogen, UA: 0.2 (ref 0.0–1.0)
pH: 6.5 (ref 5.0–8.0)

## 2019-04-13 LAB — BASIC METABOLIC PANEL
BUN: 46 mg/dL — ABNORMAL HIGH (ref 6–23)
CO2: 31 mEq/L (ref 19–32)
Calcium: 9.5 mg/dL (ref 8.4–10.5)
Chloride: 90 mEq/L — ABNORMAL LOW (ref 96–112)
Creatinine, Ser: 1.57 mg/dL — ABNORMAL HIGH (ref 0.40–1.20)
GFR: 38.22 mL/min — ABNORMAL LOW (ref >60.00)
Glucose, Bld: 99 mg/dL (ref 70–99)
Potassium: 3.5 mEq/L (ref 3.5–5.1)
Sodium: 129 mEq/L — ABNORMAL LOW (ref 135–145)

## 2019-04-13 LAB — HEPATIC FUNCTION PANEL
ALT: 23 U/L (ref 0–35)
AST: 22 U/L (ref 0–37)
Albumin: 4 g/dL (ref 3.5–5.2)
Alkaline Phosphatase: 51 U/L (ref 39–117)
Bilirubin, Direct: 0.2 mg/dL (ref 0.0–0.3)
Total Bilirubin: 0.8 mg/dL (ref 0.2–1.2)
Total Protein: 6.4 g/dL (ref 6.0–8.3)

## 2019-04-13 LAB — VITAMIN D 25 HYDROXY (VIT D DEFICIENCY, FRACTURES): VITD: 104.12 ng/mL (ref 30.00–100.00)

## 2019-04-13 LAB — VITAMIN B12: Vitamin B-12: 1454 pg/mL — ABNORMAL HIGH (ref 211–911)

## 2019-04-13 LAB — LIPID PANEL
Cholesterol: 163 mg/dL (ref 0–200)
HDL: 81.3 mg/dL (ref >39.00)
LDL Cholesterol: 65 mg/dL (ref 0–99)
NonHDL: 81.95
Total CHOL/HDL Ratio: 2
Triglycerides: 83 mg/dL (ref 0.0–149.0)
VLDL: 16.6 mg/dL (ref 0.0–40.0)

## 2019-04-13 LAB — SEDIMENTATION RATE: Sed Rate: 6 mm/hr (ref 0–30)

## 2019-04-13 LAB — CK: Total CK: 225 U/L — ABNORMAL HIGH (ref 7–177)

## 2019-04-13 LAB — TSH: TSH: 2.29 u[IU]/mL (ref 0.35–4.50)

## 2019-04-13 LAB — HEMOGLOBIN A1C: Hgb A1c MFr Bld: 5.7 % (ref 4.6–6.5)

## 2019-04-13 MED ORDER — TIZANIDINE HCL 2 MG PO CAPS: 2.0000 mg | ORAL_CAPSULE | Freq: Three times a day (TID) | ORAL | 1 refills | Status: DC | PRN

## 2019-04-13 NOTE — Patient Instructions (Signed)

## 2019-04-13 NOTE — Progress Notes (Signed)
Subjective:    Patient ID: [name], female    DOB: 23-Oct-1936, 83 y.o.   MRN: [id number]  HPI  Here for wellness and f/u;  Overall doing ok;  Pt denies Chest pain, worsening SOB, DOE, wheezing, orthopnea, PND, worsening LE edema, palpitations, dizziness or syncope.  Pt denies neurological change such as new headache, facial or extremity weakness.  Pt denies polydipsia, polyuria, or low sugar symptoms. Pt states overall good compliance with treatment and medications, good tolerability, and has been trying to follow appropriate diet.  Pt denies worsening depressive symptoms, suicidal ideation or panic. No fever, night sweats, wt loss, loss of appetite, or other constitutional symptoms.  Pt states good ability with ADL's, has low to mod fall risk, home safety reviewed and adequate, no other significant changes in hearing or vision, and only occasionally active with exercise. Also has c/o bilateral leg cramps starting again after improved for some time at night x 3 wks, only occasionally during the day, nothing else seems to make better or worse Also Last night with rolling cramping abd pain with nausea not really better with ginger ale with small "phlegm spitting up", after a while of sitting in the BR on a stool, then passed gas then had sort of explosive episode BM that just went everywhere to the commode, sink and shower curtain/.  No prior hx.  No fever. No blood or vomiting.  No pain today, just moving in "slow motion" Also state bilateral shoulders and hands feel better with the prednisone.   After a couple of hours cleaning the BR blowout yesterday and now feels sore all over after using muscles she hasnt used in some months to year.  No specific joint pain or swelling.  Has several wks more difficulty with generalized weakness and higher risk of falling. Past Medical History:  Diagnosis Date  . Allergic rhinitis 10/30/2016  . Anemia   . Asthma   . Breast cancer of upper-outer quadrant  of left female breast ([place]) 11/08/2013   ER/PR+ Her2- Left IDC   . Chronic renal insufficiency   . Chronic rhinitis   . Colon polyp   . Diastolic dysfunction 04/28/8888  . DJD (degenerative joint disease)   . Dyspnea   . Full dentures   . GERD (gastroesophageal reflux disease)   . Hearing loss   . Hypertension   . Hyponatremia   . Impaired glucose tolerance 07/18/2014  . Memory loss   . Morbid obesity ([place])   . Poor circulation   . Vertigo   . Wears glasses    Past Surgical History:  Procedure Laterality Date  . ABDOMINAL HYSTERECTOMY    . BREAST LUMPECTOMY WITH NEEDLE LOCALIZATION AND AXILLARY SENTINEL LYMPH NODE BX Left 12/04/2013   Procedure: BREAST LUMPECTOMY WITH NEEDLE LOCALIZATION AND AXILLARY SENTINEL LYMPH NODE BX;  Surgeon: Shann Medal, MD;  Location: [place];  Service: General;  Laterality: Left;  . CATARACT EXTRACTION  2009   rt  . COLONOSCOPY    . EYE SURGERY Bilateral    cataract surgery  . KNEE ARTHROSCOPY     both  . LUMBAR LAMINECTOMY/DECOMPRESSION MICRODISCECTOMY Left 12/23/2017   Procedure: Left Lumbar One-Two Laminectomy with microdiscectomy;  Surgeon: Eustace Moore, MD;  Location: [place];  Service: Neurosurgery;  Laterality: Left;  Left L1-2 Laminectomy with microdiscectomy  . TONSILLECTOMY    . TOTAL KNEE ARTHROPLASTY  2002   rt  . TOTAL KNEE ARTHROPLASTY  2003   left  .  VESICOVAGINAL FISTULA CLOSURE W/ TAH  1980    reports that she has never smoked. She has never used smokeless tobacco. She reports that she does not drink alcohol or use drugs. family history includes Colon cancer in her mother; Heart attack (age of onset: 13) in her son; Heart disease in her unknown relative; Lung cancer in her brother; Stomach cancer in her mother. Allergies  Allergen Reactions  . Hydrocodone Itching  . Tizanidine Other (See Comments)    Dizzy and fall  . Iron Hives and Other (See Comments)    Whelps, bad constipation   Current Outpatient  Medications on File Prior to Visit  Medication Sig Dispense Refill  . albuterol (PROVENTIL HFA;VENTOLIN HFA) 108 (90 Base) MCG/ACT inhaler Inhale 2 puffs into the lungs every 6 (six) hours as needed for wheezing or shortness of breath. 1 Inhaler 11  . amLODipine (NORVASC) 5 MG tablet TAKE 1 TABLET BY MOUTH EVERY DAY 90 tablet 1  . aspirin 81 MG tablet Take 81 mg by mouth daily.      [name] erythromycin ophthalmic ointment Place 1 application into the left eye 4 (four) times daily. For 10 days 3.5 g 0  . FERREX 150 150 MG capsule TAKE 1 CAPSULE BY MOUTH TWICE A DAY 60 capsule 1  . Fluticasone-Salmeterol (ADVAIR DISKUS) 250-50 MCG/DOSE AEPB Inhale 1 puff into the lungs 2 (two) times daily. 180 each 3  . furosemide (LASIX) 20 MG tablet TAKE 1 TABLET EVERY DAY AS NEEDED 90 tablet 2  . gabapentin (NEURONTIN) 100 MG capsule TAKE 1 CAPSULE BY MOUTH THREE TIMES A DAY 270 capsule 1  . hydrOXYzine (VISTARIL) 50 MG capsule Take 1 capsule (50 mg total) by mouth at bedtime as needed for itching. 30 capsule 1  . losartan-hydrochlorothiazide (HYZAAR) 100-25 MG tablet TAKE 1 TABLET BY MOUTH EVERY DAY 90 tablet 1  . meclizine (ANTIVERT) 12.5 MG tablet TAKE 1 TABLET THREE TIMES DAILY AS NEEDED&amp;nbsp;&amp;nbsp;FOR&amp;nbsp;&amp;nbsp;DIZZINESS 270 tablet 0  . methocarbamol (ROBAXIN) 500 MG tablet Take 1 tablet (500 mg total) by mouth every 8 (eight) hours as needed for muscle spasms. 10 tablet 0  . mometasone-formoterol (DULERA) 200-5 MCG/ACT AERO Inhale 2 puffs into the lungs 2 (two) times daily. 13 g 11  . montelukast (SINGULAIR) 10 MG tablet Take 1 tablet (10 mg total) by mouth daily. 90 tablet 3  . Multiple Vitamins-Minerals (CENTRUM SILVER PO) Take 1 tablet by mouth daily.      . pantoprazole (PROTONIX) 40 MG tablet TAKE 1 TABLET BY MOUTH EVERY DAY 90 tablet 1  . Potassium Gluconate 550 (90 K) MG TABS Take 550 mg by mouth daily.     . predniSONE (DELTASONE) 10 MG tablet 3 tabs by mouth per day for 3 days,2tabs per  day for 3 days,1tab per day for 3 days 18 tablet 0  . Simethicone (GAS-X PO) Take 1 tablet by mouth daily as needed (for gas).     . solifenacin (VESICARE) 5 MG tablet TAKE 1 TABLET BY MOUTH EVERY DAY 90 tablet 1  . triamcinolone (NASACORT AQ) 55 MCG/ACT AERO nasal inhaler Place 2 sprays into the nose daily. 1 Inhaler 12  . triamcinolone cream (KENALOG) 0.1 % Apply 1 application topically 2 (two) times daily. 30 g 0  . Vitamin D, Ergocalciferol, (DRISDOL) 50000 units CAPS capsule Take 1 capsule (50,000 Units total) by mouth every 7 (seven) days. (Patient taking differently: Take 50,000 Units by mouth every 7 (seven) days. On Friday) 12 capsule 0   No  current facility-administered medications on file prior to visit.    Review of Systems Constitutional: Negative for other unusual diaphoresis, sweats, appetite or weight changes HENT: Negative for other worsening hearing loss, ear pain, facial swelling, mouth sores or neck stiffness.   Eyes: Negative for other worsening pain, redness or other visual disturbance.  Respiratory: Negative for other stridor or swelling Cardiovascular: Negative for other palpitations or other chest pain  Gastrointestinal: Negative for worsening diarrhea or loose stools, blood in stool, distention or other pain Genitourinary: Negative for hematuria, flank pain or other change in urine volume.  Musculoskeletal: Negative for myalgias or other joint swelling.  Skin: Negative for other color change, or other wound or worsening drainage.  Neurological: Negative for other syncope or numbness. Hematological: Negative for other adenopathy or swelling Psychiatric/Behavioral: Negative for hallucinations, other worsening agitation, SI, self-injury, or new decreased concentration All other system neg per pt    Objective:   Physical Exam BP 126/82   Pulse 80   Temp 98.4 F (36.9 C) (Oral)   Ht _0  (1.6 m)   Wt 149 lb (67.6 kg)   SpO2 98%   BMI 26.39 kg/m  VS noted,   Constitutional: Pt is oriented to person, place, and time. Appears well-developed and well-nourished, in no significant distress and comfortable Head: Normocephalic and atraumatic  Eyes: Conjunctivae and EOM are normal. Pupils are equal, round, and reactive to light Right Ear: External ear normal without discharge Left Ear: External ear normal without discharge Nose: Nose without discharge or deformity Mouth/Throat: Oropharynx is without other ulcerations and moist  Neck: Normal range of motion. Neck supple. No JVD present. No tracheal deviation present or significant neck LA or mass Cardiovascular: Normal rate, regular rhythm, normal heart sounds and intact distal pulses.   Pulmonary/Chest: WOB normal and breath sounds without rales or wheezing  Abdominal: Soft. Bowel sounds are normal. NT. No HSM  Musculoskeletal: Normal range of motion. Exhibits no edema Lymphadenopathy: Has no other cervical adenopathy.  Neurological: Pt is alert and oriented to person, place, and time. Pt has normal reflexes. No cranial nerve deficit. Motor grossly intact, Gait intact Skin: Skin is warm and dry. No rash noted or new ulcerations Psychiatric:  Has normal mood and affect. Behavior is normal without agitation No other exam findings Lab Results  Component Value Date   WBC 12.0 (H) 04/13/2019   HGB 11.1 (L) 04/13/2019   HCT 33.3 (L) 04/13/2019   PLT 241.0 04/13/2019   GLUCOSE 99 04/13/2019   CHOL 163 04/13/2019   TRIG 83.0 04/13/2019   HDL 81.30 04/13/2019   LDLCALC 65 04/13/2019   ALT 23 04/13/2019   AST 22 04/13/2019   NA 129 (L) 04/13/2019   K 3.5 04/13/2019   CL 90 (L) 04/13/2019   CREATININE 1.57 (H) 04/13/2019   BUN 46 (H) 04/13/2019   CO2 31 04/13/2019   TSH 2.29 04/13/2019   INR 1.35 01/17/2018   HGBA1C 5.7 04/13/2019      Assessment & Plan:

## 2019-04-15 ENCOUNTER — Encounter: Payer: Self-pay | Admitting: Internal Medicine

## 2019-04-15 NOTE — Assessment & Plan Note (Signed)
Stable exam, cont same tx, f/u rheum as planned

## 2019-04-15 NOTE — Assessment & Plan Note (Signed)
?   Worse with recent MSK overuse - for ck with labs, r/o rhabdo

## 2019-04-15 NOTE — Assessment & Plan Note (Signed)
Encouraged to use walker even in the home for safety

## 2019-04-15 NOTE — Assessment & Plan Note (Signed)

## 2019-04-15 NOTE — Assessment & Plan Note (Addendum)
Had one episode large "blowout" yesterday with difficult cleanup, none further, no blood, no fever, no abd pain, will cont to follow  In addition to the time spent performing CPE, I spent an additional 40 minutes face to face,in which greater than 50% of this time was spent in counseling and coordination of care for patient's acute illness as documented, including the differential dx, treatment, further evaluation and other management of diarrhea, gait d/o, generalized weakness, hyperglycemia, HTN, RA

## 2019-04-15 NOTE — Assessment & Plan Note (Signed)
stable overall by history and exam, recent data reviewed with pt, and pt to continue medical treatment as before,  to f/u any worsening symptoms or concerns  

## 2019-04-15 NOTE — Assessment & Plan Note (Signed)
For labs as ordered, declines referral for outpatient PT

## 2019-05-09 ENCOUNTER — Other Ambulatory Visit: Payer: Medicare HMO

## 2019-05-09 ENCOUNTER — Ambulatory Visit: Payer: Medicare HMO | Admitting: Oncology

## 2019-05-10 ENCOUNTER — Telehealth: Payer: Self-pay

## 2019-05-10 NOTE — Telephone Encounter (Signed)
Pt wanted to know what unit of OTC vit D she should be taking. I informed her she should be taking vit d3 2000u daily.   Copied from [place]. Topic: General - Other >> May 09, 2019  2:46 PM Rainey Pines A wrote: Patient would like a callback from Dr. Judi Cong nurse in regards to medication vitamin D .

## 2019-05-16 ENCOUNTER — Telehealth: Payer: Self-pay | Admitting: Internal Medicine

## 2019-05-16 MED ORDER — VITAMIN D (ERGOCALCIFEROL) 1.25 MG (50000 UNIT) PO CAPS: 50000.0000 [IU] | ORAL_CAPSULE | ORAL | 0 refills | Status: DC

## 2019-05-16 NOTE — Telephone Encounter (Signed)
Copied from [place] [id number]. Topic: Quick Communication - Rx Refill/Question >> May 16, 2019 12:18 PM Nils Flack, Melissa J wrote: Medication: Vitamin D, Ergocalciferol, (DRISDOL) 50000 units CAPS capsule Has the patient contacted their pharmacy? no (Agent: If no, request that the patient contact the pharmacy for the refill.) (Agent: If yes, when and what did the pharmacy advise?) told pt to call office   Preferred Pharmacy (with phone number [facility] street name): [facility]  Agent: Please be advised that RX refills may take up to 3 business days. We ask that you follow-up with your pharmacy.

## 2019-05-18 ENCOUNTER — Other Ambulatory Visit: Payer: Self-pay | Admitting: Internal Medicine

## 2019-05-19 ENCOUNTER — Telehealth: Payer: Self-pay

## 2019-05-19 NOTE — Telephone Encounter (Signed)
Copied from [place] [id number]. Topic: General - Other >> May 19, 2019 10:09 AM Jodie Echevaria wrote: Reason for CRM: Patient called to say that she spoke to [facility] and they say that it is ok for her to get Ensure and Depends all that Dr Jenny Reichmann will just write a prescription for these items. Patient would like a call back with an answer please. Ph# [phone]

## 2019-05-22 MED ORDER — ENSURE PO LIQD: 1.0000 | Freq: Three times a day (TID) | ORAL | 12 refills | Status: DC

## 2019-05-22 MED ORDER — DEPEND UNDERWEAR SM/MED MISC: 5 refills | Status: DC

## 2019-05-22 NOTE — Telephone Encounter (Signed)
Done depends and ensure

## 2019-05-22 NOTE — Addendum Note (Signed)
Addended by: Biagio Borg on: 05/22/2019 05:19 PM   Modules accepted: Orders

## 2019-05-23 ENCOUNTER — Other Ambulatory Visit: Payer: Self-pay | Admitting: Internal Medicine

## 2019-05-23 MED ORDER — DEPEND UNDERWEAR SM/MED MISC: 5 refills | Status: AC

## 2019-05-23 MED ORDER — ENSURE PO LIQD: 1.0000 | Freq: Three times a day (TID) | ORAL | 12 refills | Status: AC

## 2019-05-23 NOTE — Telephone Encounter (Signed)
Ok done hardcopy; was already sent erx

## 2019-05-23 NOTE — Telephone Encounter (Signed)
I have not seen orders on my desk. Please advise.

## 2019-05-24 NOTE — Telephone Encounter (Signed)
Mailed to pt

## 2019-05-26 DIAGNOSIS — M25519 Pain in unspecified shoulder: Secondary | ICD-10-CM | POA: Diagnosis not present

## 2019-05-26 DIAGNOSIS — R700 Elevated erythrocyte sedimentation rate: Secondary | ICD-10-CM | POA: Diagnosis not present

## 2019-05-26 DIAGNOSIS — M1180 Other specified crystal arthropathies, unspecified site: Secondary | ICD-10-CM | POA: Diagnosis not present

## 2019-05-26 DIAGNOSIS — M1990 Unspecified osteoarthritis, unspecified site: Secondary | ICD-10-CM | POA: Diagnosis not present

## 2019-05-26 DIAGNOSIS — M7500 Adhesive capsulitis of unspecified shoulder: Secondary | ICD-10-CM | POA: Diagnosis not present

## 2019-05-26 DIAGNOSIS — Z79899 Other long term (current) drug therapy: Secondary | ICD-10-CM | POA: Diagnosis not present

## 2019-05-26 DIAGNOSIS — M79643 Pain in unspecified hand: Secondary | ICD-10-CM | POA: Diagnosis not present

## 2019-06-01 ENCOUNTER — Ambulatory Visit (INDEPENDENT_AMBULATORY_CARE_PROVIDER_SITE_OTHER): Payer: Medicare HMO | Admitting: Internal Medicine

## 2019-06-01 ENCOUNTER — Other Ambulatory Visit: Payer: Self-pay | Admitting: Internal Medicine

## 2019-06-01 ENCOUNTER — Other Ambulatory Visit (INDEPENDENT_AMBULATORY_CARE_PROVIDER_SITE_OTHER): Payer: Medicare HMO

## 2019-06-01 ENCOUNTER — Encounter: Payer: Self-pay | Admitting: Internal Medicine

## 2019-06-01 DIAGNOSIS — R350 Frequency of micturition: Secondary | ICD-10-CM

## 2019-06-01 LAB — URINALYSIS, ROUTINE W REFLEX MICROSCOPIC
Bilirubin Urine: NEGATIVE
Ketones, ur: NEGATIVE
Nitrite: NEGATIVE
Specific Gravity, Urine: <=1.005 — AB (ref 1.000–1.030)
Total Protein, Urine: NEGATIVE
Urine Glucose: NEGATIVE
Urobilinogen, UA: 0.2 (ref 0.0–1.0)
pH: 7 (ref 5.0–8.0)

## 2019-06-01 MED ORDER — CIPROFLOXACIN HCL 500 MG PO TABS: 500.0000 mg | ORAL_TABLET | Freq: Two times a day (BID) | ORAL | 0 refills | Status: AC

## 2019-06-01 MED ORDER — SOLIFENACIN SUCCINATE 5 MG PO TABS: 5.0000 mg | ORAL_TABLET | Freq: Every day | ORAL | 3 refills | Status: DC

## 2019-06-01 NOTE — Progress Notes (Signed)
Patient ID: [name], female   DOB: 1936/08/15, 83 y.o.   MRN: [id number]  Phone visit  Cumulative time during 7-day interval 13 min, there was not an associated office visit for this concern within a 7 day period.  Verbal consent for services obtained from patient prior to services given.  Names of all persons present for services: Cathlean Cower, MD, patient  Chief complaint: general medical concerns  History, background, results pertinent:  Here with 3 wks onset unusual urinary frequency but Denies urinary symptoms such as dysuria, urgency, flank pain, hematuria or n/v, fever, chills.  The worst part is having to get up several times at night, then cant get back to sleep, very annoying.  Pt denies chest pain, increased sob or doe, wheezing, orthopnea, PND, increased LE swelling, palpitations, dizziness or syncope.   Pt denies polydipsia, polyuria.  No new other complaints except ongoing shoulder arthritic pain and LBP followed per rheum with recent small increase in prednisone per pt  Past Medical History:  Diagnosis Date  . Allergic rhinitis 10/30/2016  . Anemia   . Asthma   . Breast cancer of upper-outer quadrant of left female breast ([place]) 11/08/2013   ER/PR+ Her2- Left IDC   . Chronic renal insufficiency   . Chronic rhinitis   . Colon polyp   . Diastolic dysfunction 2/77/4128  . DJD (degenerative joint disease)   . Dyspnea   . Full dentures   . GERD (gastroesophageal reflux disease)   . Hearing loss   . Hypertension   . Hyponatremia   . Impaired glucose tolerance 07/18/2014  . Memory loss   . Morbid obesity ([place])   . Poor circulation   . Vertigo   . Wears glasses    No results found for this or any previous visit (from the past 48 hour(s)).  Lab Results  Component Value Date   WBC 12.0 (H) 04/13/2019   HGB 11.1 (L) 04/13/2019   HCT 33.3 (L) 04/13/2019   PLT 241.0 04/13/2019   GLUCOSE 99 04/13/2019   CHOL 163 04/13/2019   TRIG 83.0 04/13/2019   HDL 81.30  04/13/2019   LDLCALC 65 04/13/2019   ALT 23 04/13/2019   AST 22 04/13/2019   NA 129 (L) 04/13/2019   K 3.5 04/13/2019   CL 90 (L) 04/13/2019   CREATININE 1.57 (H) 04/13/2019   BUN 46 (H) 04/13/2019   CO2 31 04/13/2019   TSH 2.29 04/13/2019   INR 1.35 01/17/2018   HGBA1C 5.7 04/13/2019   '  A/P/next steps:   1)  Urinary freq - UA in June 2020 neg for acute, will repeat urine studies at the lab today or tomorrow when she can get transportation with the duaghter, and empiric trial vesicare 5 mg daily for probable OAB.  Consider urology consult  Cathlean Cower MD

## 2019-06-01 NOTE — Assessment & Plan Note (Signed)
See notes

## 2019-06-01 NOTE — Patient Instructions (Signed)
OK to start the vesicare 5 mg per day  Please continue all other medications as before, and refills have been done if requested.  Please have the pharmacy call with any other refills you may need.  Please keep your appointments with your specialists as you may have planned  Please go to the LAB in the Basement (turn left off the elevator) for the tests to be done   You will be contacted by phone if any changes need to be made immediately.  Otherwise, you will receive a letter about your results with an explanation, but please check with MyChart first.  Please remember to sign up for MyChart if you have not done so, as this will be important to you in the future with finding out test results, communicating by private email, and scheduling acute appointments online when needed.  Please return in 6 months, or sooner if needed

## 2019-06-02 ENCOUNTER — Telehealth: Payer: Self-pay

## 2019-06-02 NOTE — Telephone Encounter (Signed)
Called pt, LVM.   CRM created.  

## 2019-06-02 NOTE — Telephone Encounter (Signed)
-----   Message from Biagio Borg, MD sent at 06/01/2019  7:25 PM EDT ----- [name] to let pt know -  UA is c/w possible infection  I will send antibiotic, and culture is pending

## 2019-06-03 ENCOUNTER — Encounter: Payer: Self-pay | Admitting: Internal Medicine

## 2019-06-03 LAB — URINE CULTURE
MICRO NUMBER:: 744051
Result:: NO GROWTH
SPECIMEN QUALITY:: ADEQUATE

## 2019-06-27 DIAGNOSIS — M7500 Adhesive capsulitis of unspecified shoulder: Secondary | ICD-10-CM | POA: Diagnosis not present

## 2019-06-27 DIAGNOSIS — M25519 Pain in unspecified shoulder: Secondary | ICD-10-CM | POA: Diagnosis not present

## 2019-06-27 DIAGNOSIS — R700 Elevated erythrocyte sedimentation rate: Secondary | ICD-10-CM | POA: Diagnosis not present

## 2019-06-27 DIAGNOSIS — M79643 Pain in unspecified hand: Secondary | ICD-10-CM | POA: Diagnosis not present

## 2019-06-27 DIAGNOSIS — M1180 Other specified crystal arthropathies, unspecified site: Secondary | ICD-10-CM | POA: Diagnosis not present

## 2019-06-27 DIAGNOSIS — M1990 Unspecified osteoarthritis, unspecified site: Secondary | ICD-10-CM | POA: Diagnosis not present

## 2019-06-27 DIAGNOSIS — Z79899 Other long term (current) drug therapy: Secondary | ICD-10-CM | POA: Diagnosis not present

## 2019-06-29 IMAGING — CR DG KNEE COMPLETE 4+V*R*
4 series · 4 of 4 positions shown · non-contrast
Comparison: RIGHT knee radiograph September 29, 2005

CLINICAL DATA: RIGHT knee pain after fall this morning.

EXAM:
RIGHT KNEE - COMPLETE 4+ VIEW

[knee ap]
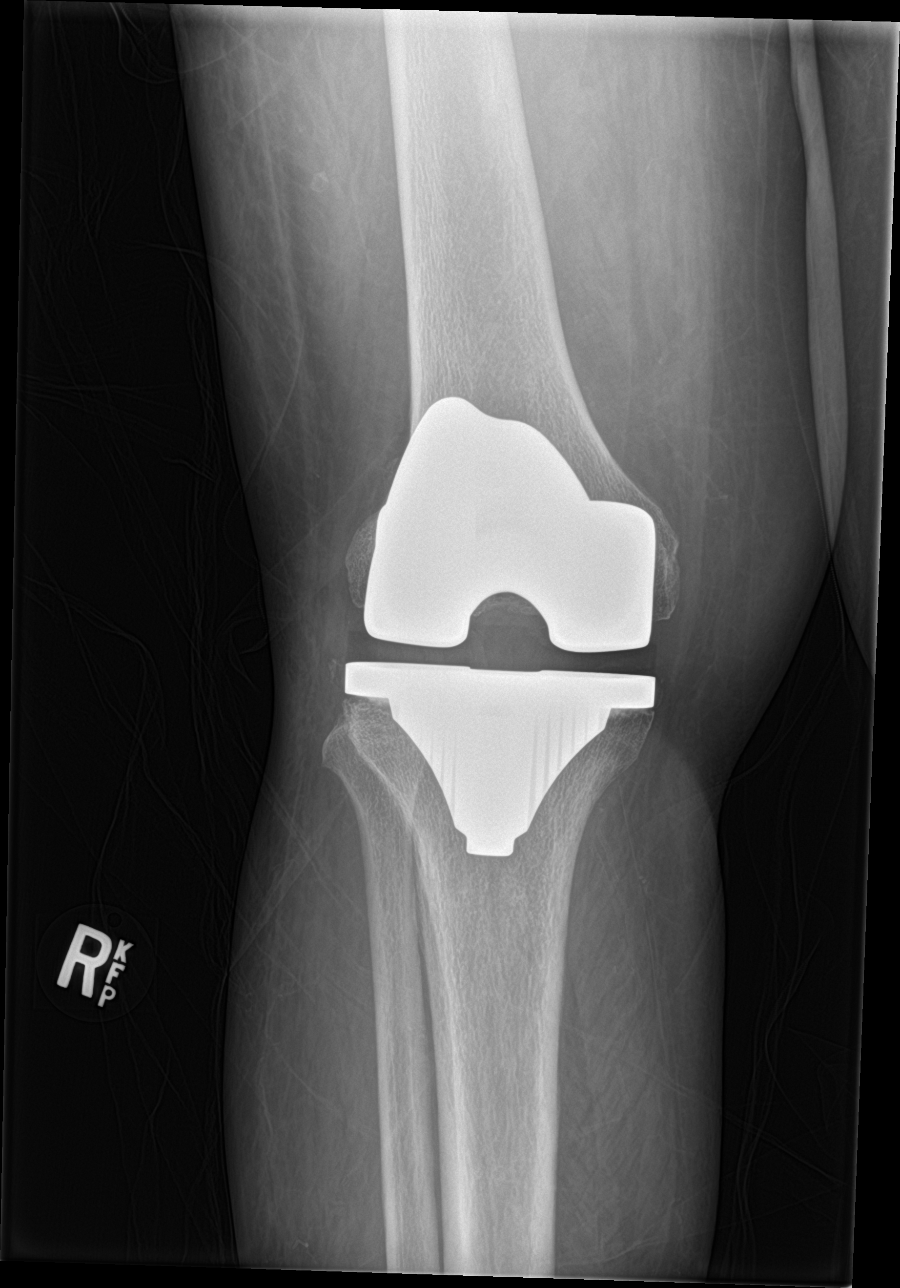

[knee lat]
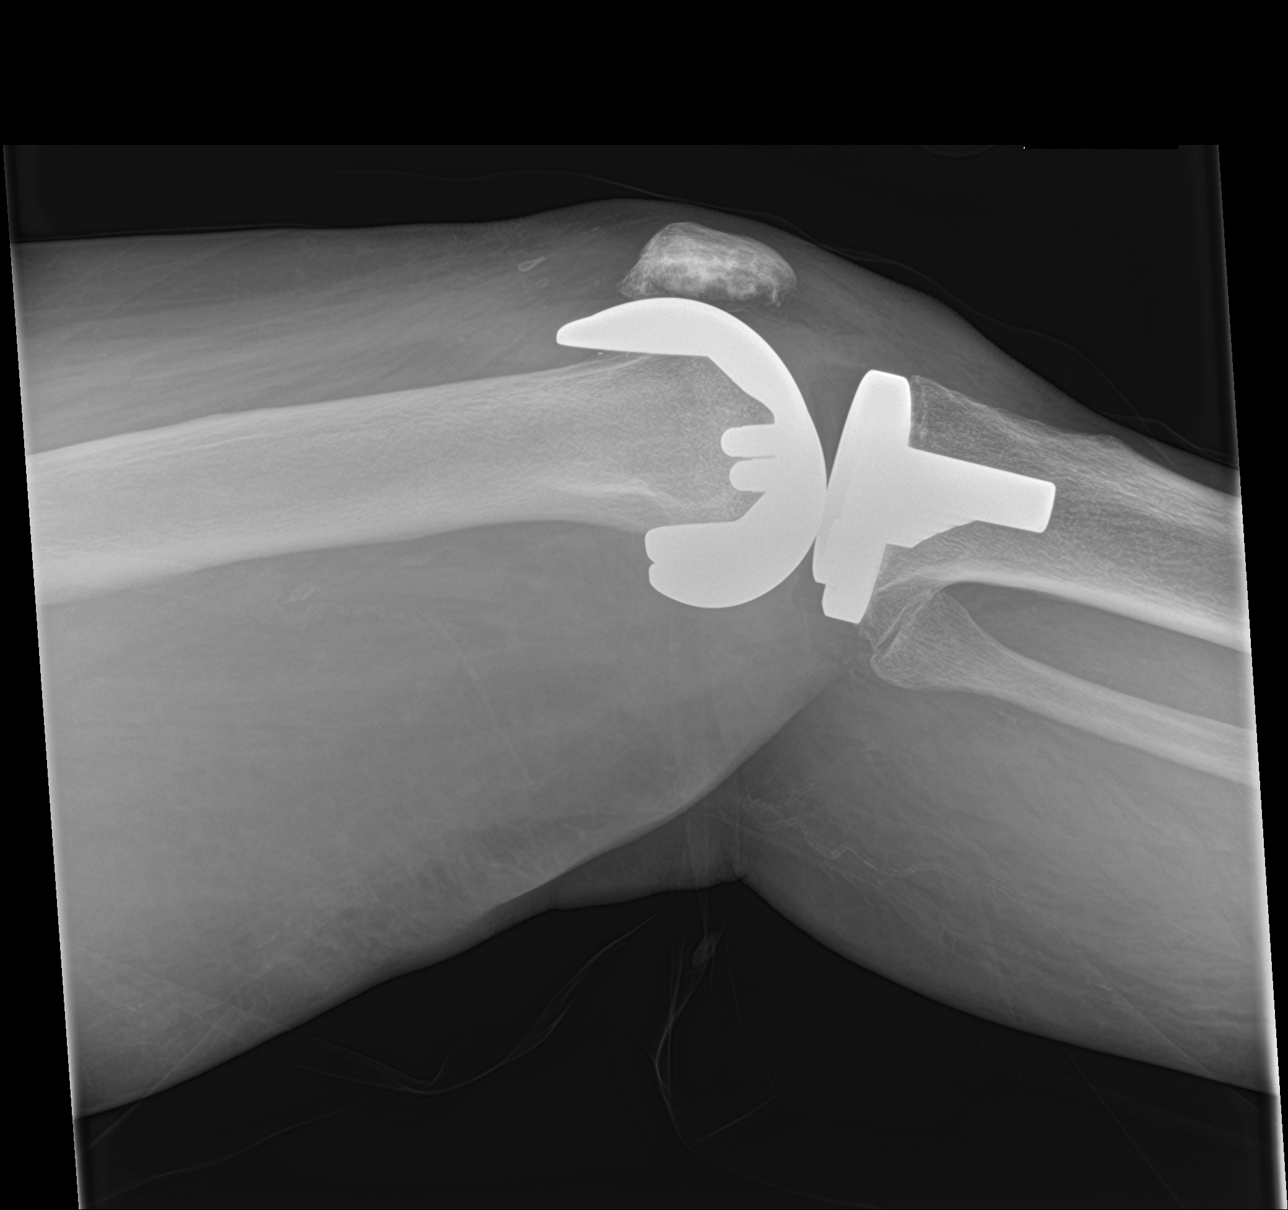

[knee obl (1 of 2)]
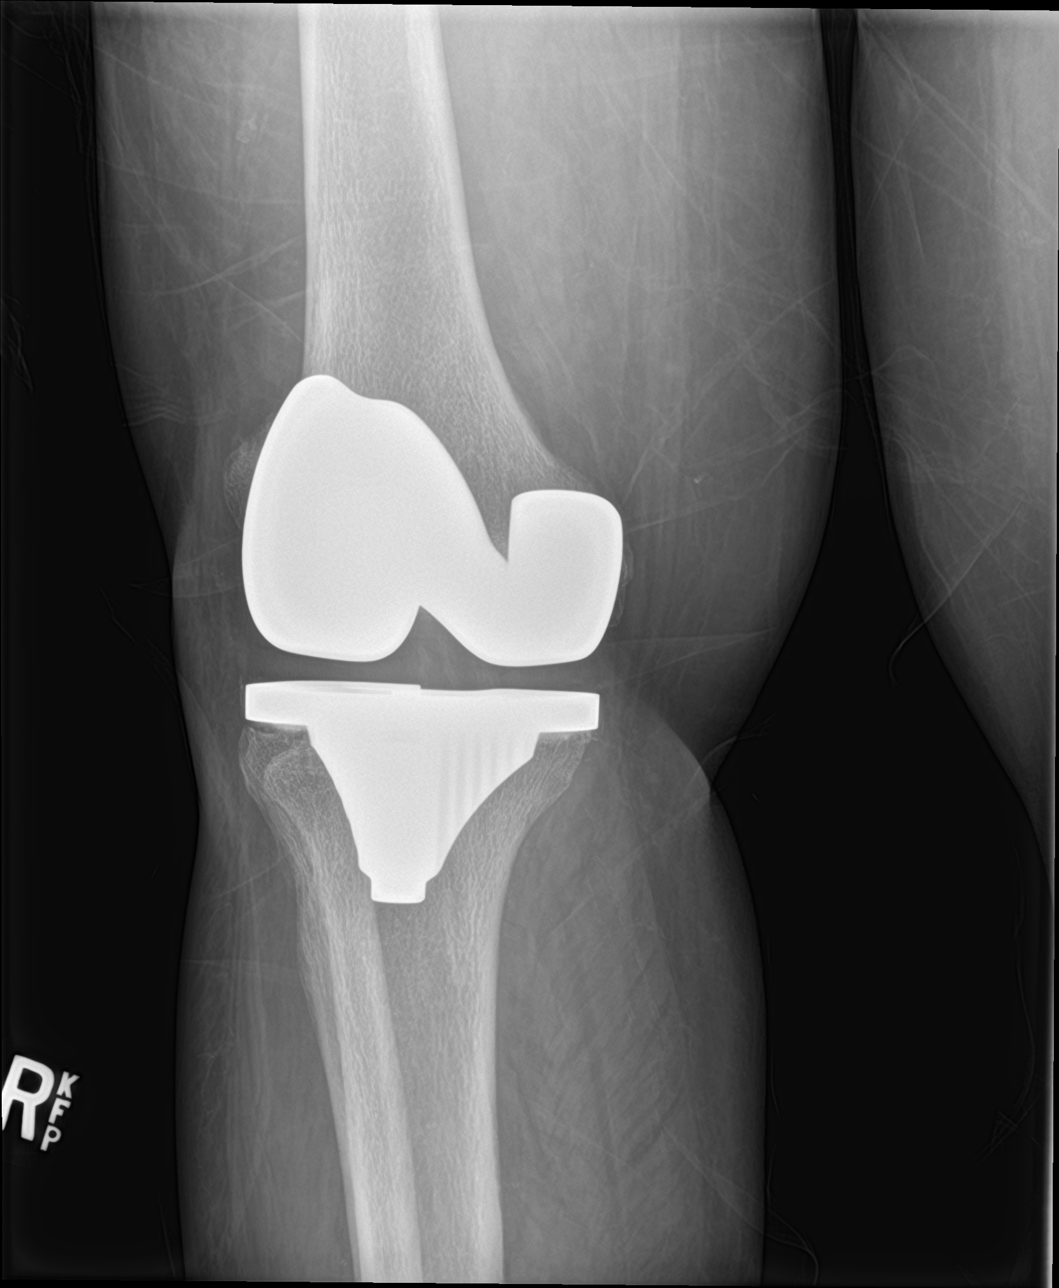

[knee obl (2 of 2)]
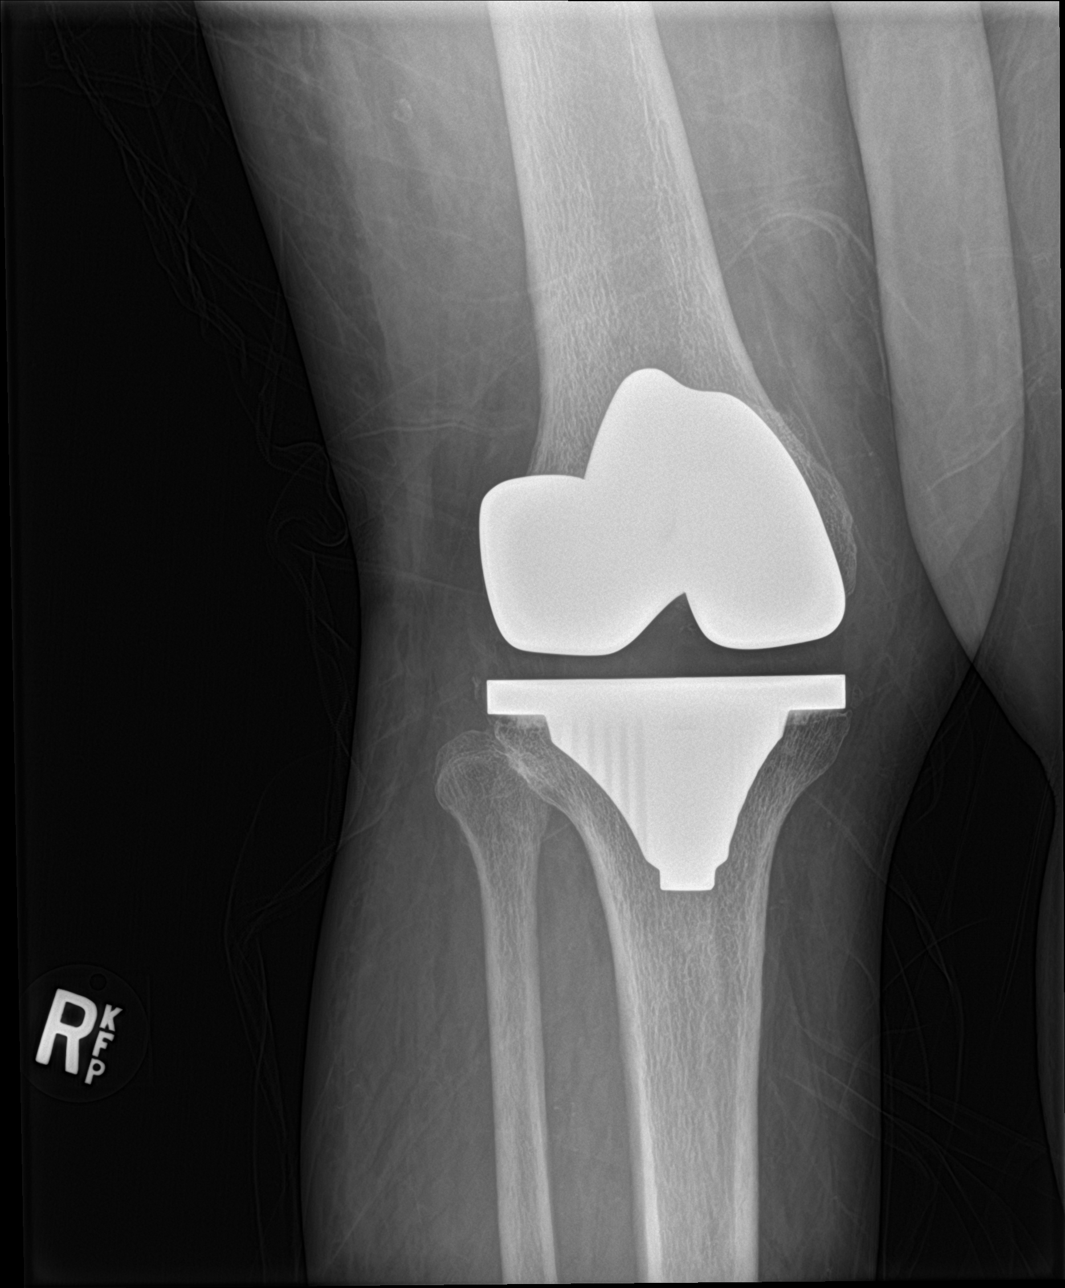

[4 of 4 positions shown; findings below may reference images not displayed]

FINDINGS: No fracture deformity or dislocation. Status post total knee
arthroplasty with intact well-seated non cemented hardware without
periprosthetic lucency. Resurfaced patella. No destructive bony
lesions. Mild vascular calcifications. No subcutaneous gas or
radiopaque foreign bodies.
IMPRESSION: 1. No fracture deformity or dislocation.
2. Status post total knee arthroplasty without radiographic findings
of hardware failure.

## 2019-06-29 IMAGING — CR DG FEMUR 2+V*R*
4 series · 4 of 4 positions shown · non-contrast
Comparison: None.

CLINICAL DATA: Per [REDACTED], pt from home with complaints of right leg
pain from the right pelvic area to the right knee after having a
fall this morning. Pt takes zanaflex for chronic back pain "which
makes me tired and dizziness and I usually lay down and sleep it
off." Fall occurred after pt took the medication. No LOC but did
have dizziness prior to falling. Patient also reports a "bump" on
anterior aspect of right knee, states she does not remember if it
was there before the fall. Denies right knee pain. Patient denying
left hip pain at this time, but states "they have been watching my
legs closely due to blood clots recently."

EXAM:
RIGHT FEMUR 2 VIEWS

[femur ap (1 of 2)]
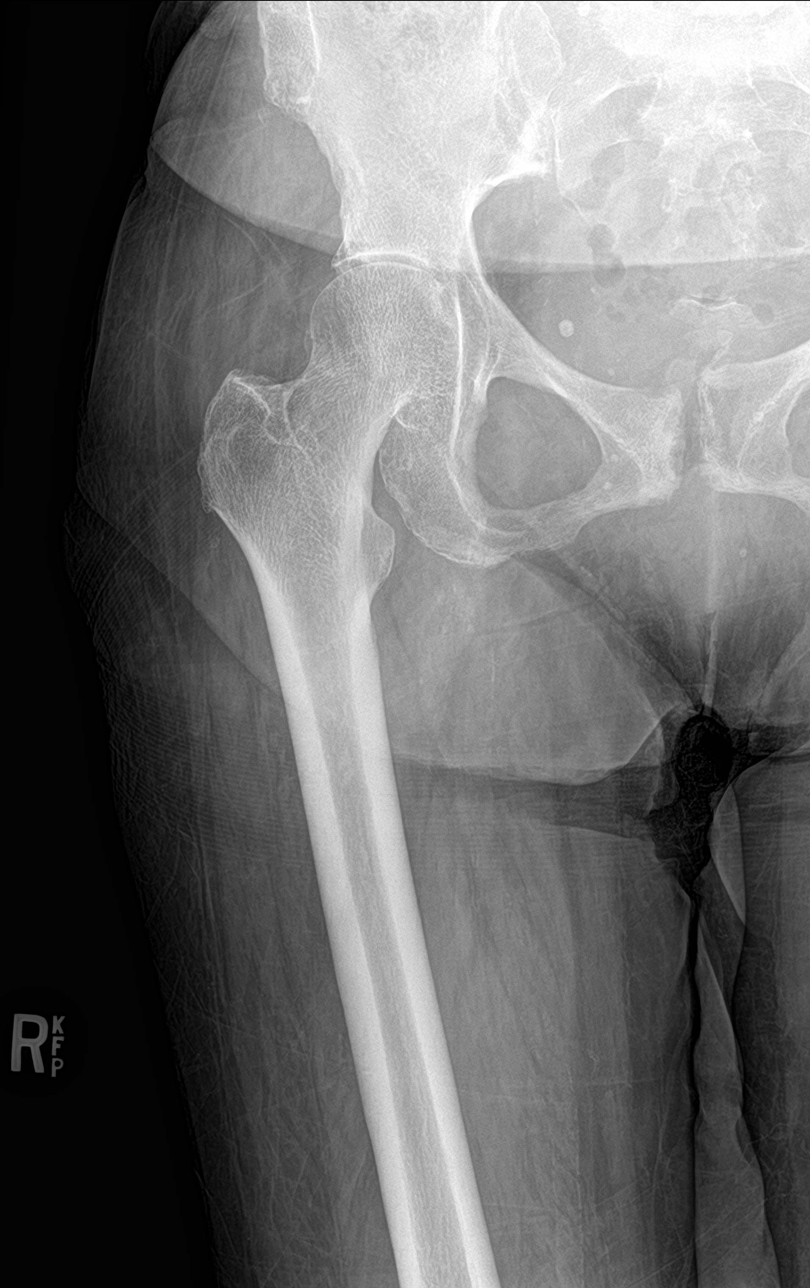

[femur ap (2 of 2)]
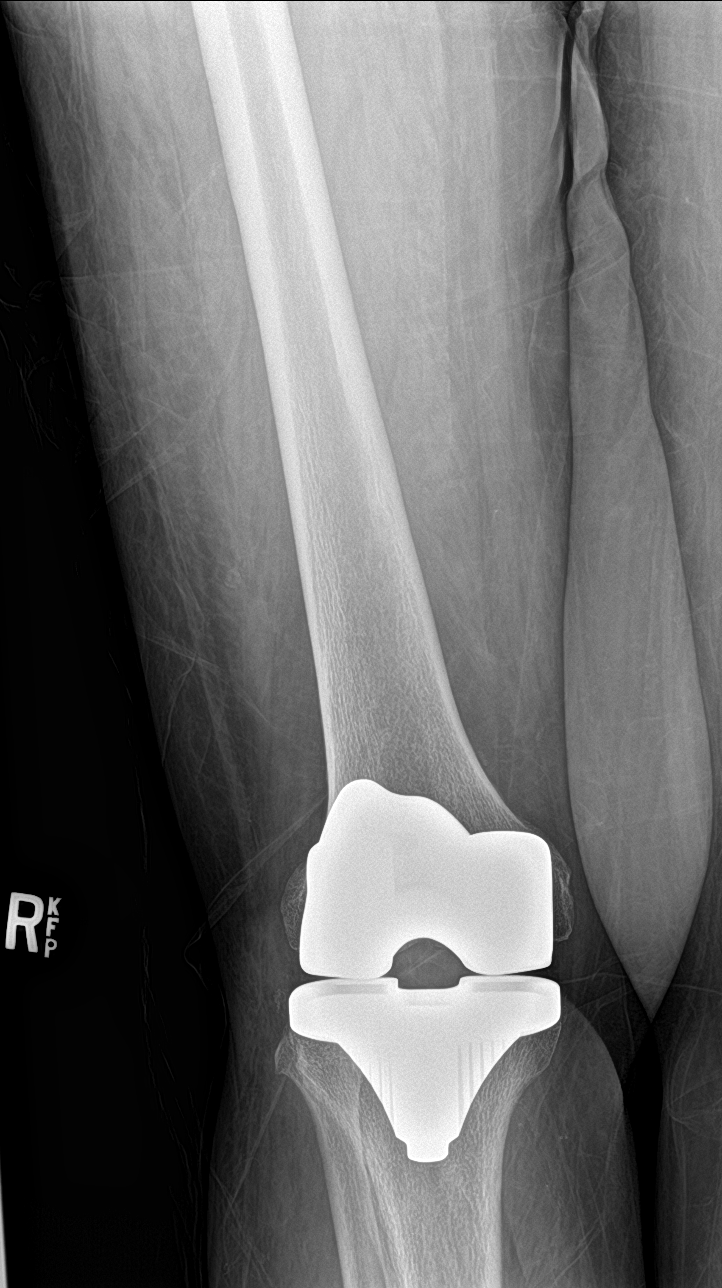

[femur lat (1 of 2)]
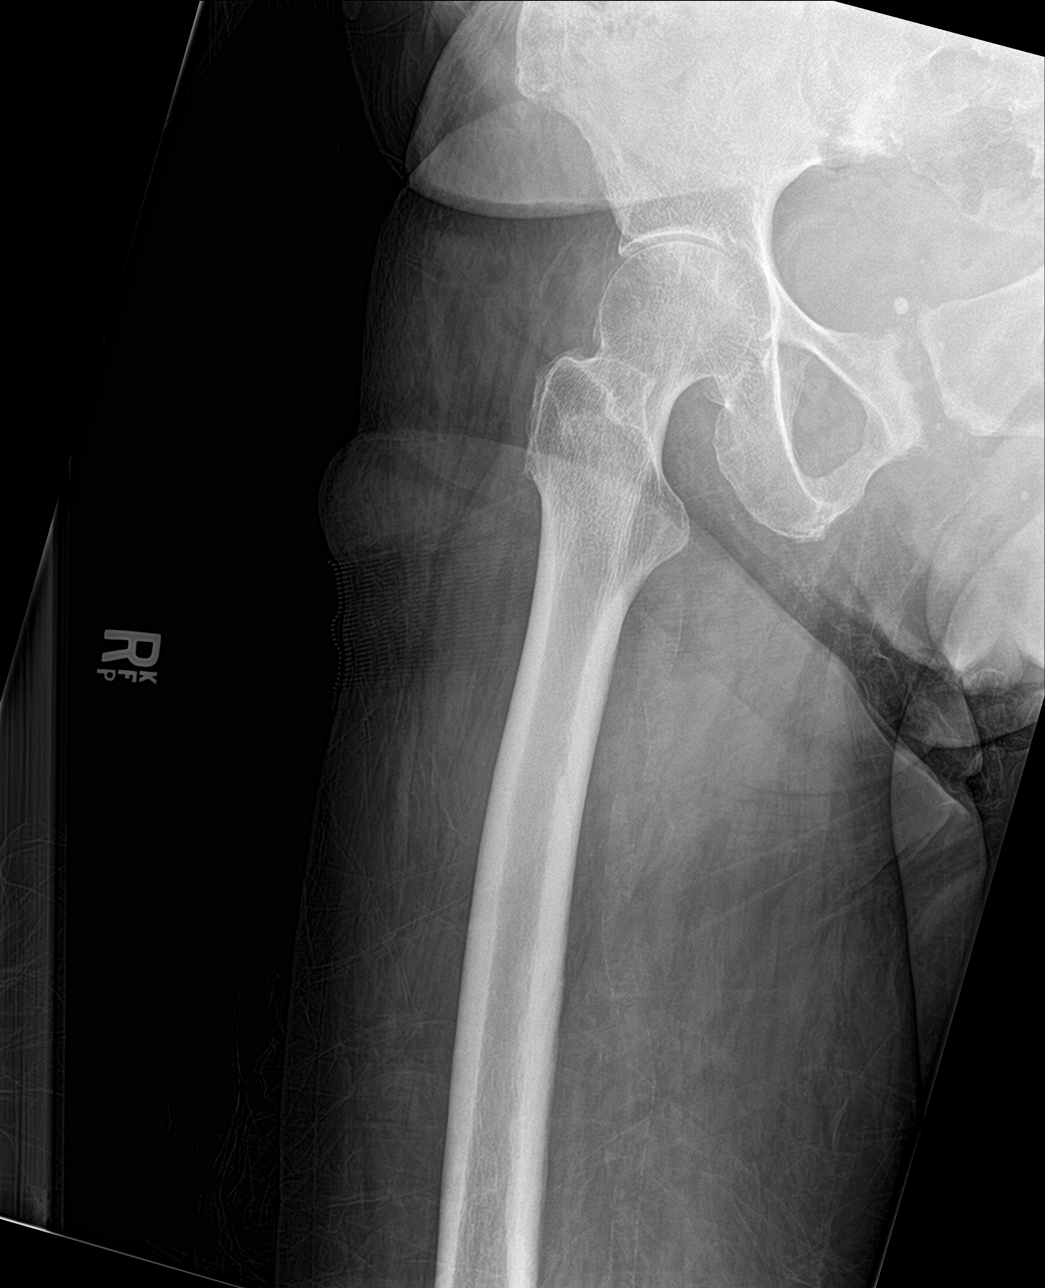

[femur lat (2 of 2)]
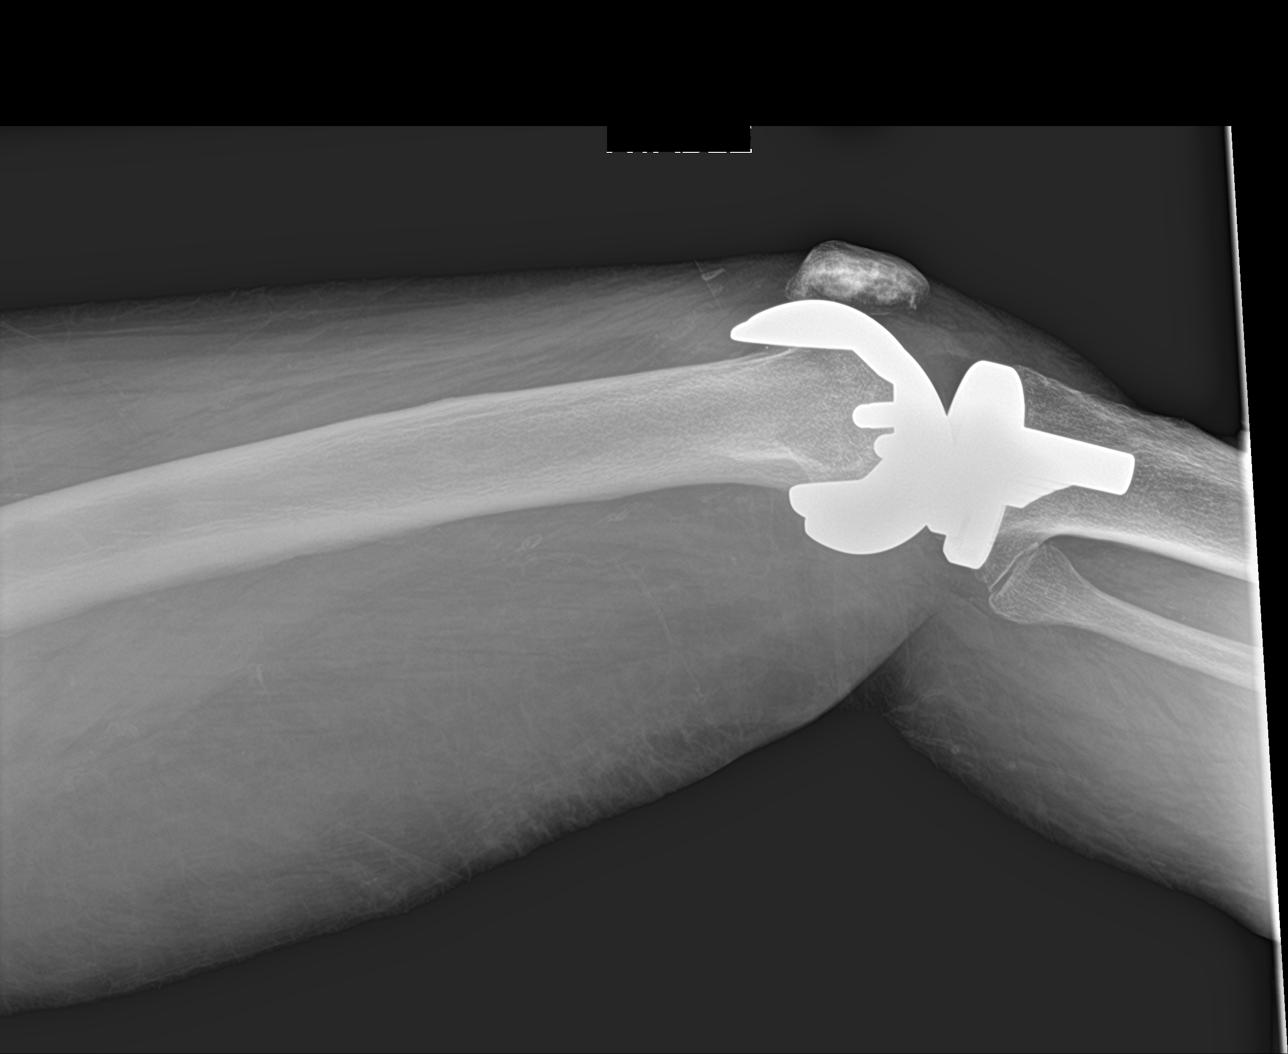

[4 of 4 positions shown; findings below may reference images not displayed]

FINDINGS: No fracture.  No bone lesion.

Knee prosthetic components appear well seated and well aligned. Hip
joint is normally spaced and aligned.

Soft tissues are unremarkable.
IMPRESSION: No fracture or dislocation.

## 2019-07-04 ENCOUNTER — Other Ambulatory Visit: Payer: Self-pay | Admitting: Internal Medicine

## 2019-07-14 ENCOUNTER — Other Ambulatory Visit: Payer: Self-pay | Admitting: Internal Medicine

## 2019-07-15 ENCOUNTER — Other Ambulatory Visit: Payer: Self-pay | Admitting: Internal Medicine

## 2019-07-17 ENCOUNTER — Other Ambulatory Visit: Payer: Self-pay | Admitting: Internal Medicine

## 2019-07-24 DIAGNOSIS — Z79899 Other long term (current) drug therapy: Secondary | ICD-10-CM | POA: Diagnosis not present

## 2019-07-24 DIAGNOSIS — M1990 Unspecified osteoarthritis, unspecified site: Secondary | ICD-10-CM | POA: Diagnosis not present

## 2019-07-25 DIAGNOSIS — M1990 Unspecified osteoarthritis, unspecified site: Secondary | ICD-10-CM | POA: Diagnosis not present

## 2019-07-25 DIAGNOSIS — D649 Anemia, unspecified: Secondary | ICD-10-CM | POA: Diagnosis not present

## 2019-08-02 ENCOUNTER — Other Ambulatory Visit: Payer: Self-pay | Admitting: Internal Medicine

## 2019-08-09 ENCOUNTER — Other Ambulatory Visit: Payer: Self-pay | Admitting: Internal Medicine

## 2019-08-09 MED ORDER — SOLIFENACIN SUCCINATE 5 MG PO TABS: 5.0000 mg | ORAL_TABLET | Freq: Every day | ORAL | 3 refills | Status: DC

## 2019-08-09 NOTE — Telephone Encounter (Signed)
Patient states she does not need a refill regarding medication mentioned below. Medication is not helping patient is getting out of bed at night every hour, patient seeking alternate medication, please advise on how long PCP wanted patient take medication   [id number] [place], [place]. AT [address] [place] [phone] (Phone) [phone] (Fax)

## 2019-08-09 NOTE — Telephone Encounter (Signed)
Copied from [place] [id number]. Topic: Quick Communication - Rx Refill/Question >> Aug 09, 2019  3:35 PM Rainey Pines A wrote: Medication: solifenacin (VESICARE) 5 MG tablet   Has the patient contacted their pharmacy? {Yes (Agent: If no, request that the patient contact the pharmacy for the refill.) (Agent: If yes, when and what did the pharmacy advise?)Contact PCP  Preferred Pharmacy (with phone number or street name):[id number] - [place], [place]. AT [address] [phone] (Phone) [phone] (Fax)    Agent: Please be advised that RX refills may take up to 3 business days. We ask that you follow-up with your pharmacy.

## 2019-08-09 NOTE — Telephone Encounter (Signed)
This is considered a long term medication.  If not helping, I can refer to urology, thanks

## 2019-08-10 ENCOUNTER — Other Ambulatory Visit: Payer: Self-pay | Admitting: Internal Medicine

## 2019-08-10 DIAGNOSIS — R350 Frequency of micturition: Secondary | ICD-10-CM

## 2019-08-10 NOTE — Telephone Encounter (Signed)
Pt has been informed to get OTC vit D now. Please enter referral for urology.

## 2019-08-10 NOTE — Telephone Encounter (Signed)
Patient called back.  States she would like referral to Urology.   Also, would like a call back in regard to Vit D.  States she has ran out of this script.  Please call to advise what to do.

## 2019-08-10 NOTE — Telephone Encounter (Signed)
Ok, this is done 

## 2019-08-10 NOTE — Telephone Encounter (Signed)
Left pt vm to call back

## 2019-08-15 ENCOUNTER — Telehealth: Payer: Self-pay

## 2019-08-15 NOTE — Telephone Encounter (Signed)
Please take Vitamin D 50000 units weekly for 12 weeks (which you may have already done), then plan to change to OTC Vitamin D3 at 2000 units per day, indefinitely.

## 2019-08-15 NOTE — Telephone Encounter (Signed)
Copied from [place] [phone]. Topic: General - Other >> Aug 14, 2019  3:46 PM Rainey Pines A wrote: Patient called and is requesting Shirron call Merrilee Ancona patients daughter at [phone] in regards to patients vitamin d

## 2019-08-15 NOTE — Telephone Encounter (Signed)
Spoke with daughter and she stated someone informed pt that she is supposed to be taking OTC vitamin D2 and not D3. Daughter stated pharmacist told her D2 is prescription. Please advise on what you would like pt to be taking.

## 2019-08-16 NOTE — Telephone Encounter (Signed)
Daughter notified 

## 2019-08-21 DIAGNOSIS — H10413 Chronic giant papillary conjunctivitis, bilateral: Secondary | ICD-10-CM | POA: Diagnosis not present

## 2019-08-21 DIAGNOSIS — E119 Type 2 diabetes mellitus without complications: Secondary | ICD-10-CM | POA: Diagnosis not present

## 2019-08-21 DIAGNOSIS — H04123 Dry eye syndrome of bilateral lacrimal glands: Secondary | ICD-10-CM | POA: Diagnosis not present

## 2019-08-21 DIAGNOSIS — Z961 Presence of intraocular lens: Secondary | ICD-10-CM | POA: Diagnosis not present

## 2019-08-21 DIAGNOSIS — H43813 Vitreous degeneration, bilateral: Secondary | ICD-10-CM | POA: Diagnosis not present

## 2019-08-21 LAB — HM DIABETES EYE EXAM

## 2019-08-29 ENCOUNTER — Other Ambulatory Visit: Payer: Self-pay | Admitting: Internal Medicine

## 2019-09-06 ENCOUNTER — Ambulatory Visit: Payer: Self-pay | Admitting: *Deleted

## 2019-09-06 DIAGNOSIS — M2550 Pain in unspecified joint: Secondary | ICD-10-CM | POA: Diagnosis not present

## 2019-09-06 DIAGNOSIS — M7500 Adhesive capsulitis of unspecified shoulder: Secondary | ICD-10-CM | POA: Diagnosis not present

## 2019-09-06 DIAGNOSIS — M79643 Pain in unspecified hand: Secondary | ICD-10-CM | POA: Diagnosis not present

## 2019-09-06 DIAGNOSIS — M1180 Other specified crystal arthropathies, unspecified site: Secondary | ICD-10-CM | POA: Diagnosis not present

## 2019-09-06 DIAGNOSIS — Z79899 Other long term (current) drug therapy: Secondary | ICD-10-CM | POA: Diagnosis not present

## 2019-09-06 DIAGNOSIS — M791 Myalgia, unspecified site: Secondary | ICD-10-CM | POA: Diagnosis not present

## 2019-09-06 DIAGNOSIS — R700 Elevated erythrocyte sedimentation rate: Secondary | ICD-10-CM | POA: Diagnosis not present

## 2019-09-06 DIAGNOSIS — D649 Anemia, unspecified: Secondary | ICD-10-CM | POA: Diagnosis not present

## 2019-09-06 DIAGNOSIS — M25519 Pain in unspecified shoulder: Secondary | ICD-10-CM | POA: Diagnosis not present

## 2019-09-06 DIAGNOSIS — M1990 Unspecified osteoarthritis, unspecified site: Secondary | ICD-10-CM | POA: Diagnosis not present

## 2019-09-06 DIAGNOSIS — Z13820 Encounter for screening for osteoporosis: Secondary | ICD-10-CM | POA: Diagnosis not present

## 2019-09-06 NOTE — Telephone Encounter (Signed)
Reported having blood "more than a smidgen" in her normal stool this morning. Denies abdominal pain/dizziness/diarrhea. No difficulty voiding.Denies rectal pain. No fever. No N/V. Discussed high fiber diet and increasing her daily water intake. No covid contacts/No travels/No fever. Her daughter has to accompany her to her visits.   Reason for Disposition . MILD rectal bleeding (more than just a few drops or streaks)  Answer Assessment - Initial Assessment Questions 1. APPEARANCE of BLOOD: "What color is it?" "Is it passed separately, on the surface of the stool, or mixed in with the stool?"      Bright red blood in the stool 2. AMOUNT: "How much blood was passed?"      "not a lot"  3. FREQUENCY: "How many times has blood been passed with the stools?"      Just in the one stool today 4. ONSET: "When was the blood first seen in the stools?" (Days or weeks)      today 5. DIARRHEA: "Is there also some diarrhea?" If so, ask: "How many diarrhea stools were passed in past 24 hours?"      Lose not diarrhea, just one today 6. CONSTIPATION: "Do you have constipation?" If so, "How bad is it?"     no 7. RECURRENT SYMPTOMS: "Have you had blood in your stools before?" If so, ask: "When was the last time?" and "What happened that time?"     no 8. BLOOD THINNERS: "Do you take any blood thinners?" (e.g., Coumadin/warfarin, Pradaxa/dabigatran, aspirin)     Asa 81 mg daily 9. OTHER SYMPTOMS: "Do you have any other symptoms?"  (e.g., abdominal pain, vomiting, dizziness, fever)     none 10. PREGNANCY: "Is there any chance you are pregnant?" "When was your last menstrual period?"       na  Protocols used: RECTAL BLEEDING-A-AH

## 2019-09-07 ENCOUNTER — Ambulatory Visit (INDEPENDENT_AMBULATORY_CARE_PROVIDER_SITE_OTHER): Payer: Medicare HMO | Admitting: Internal Medicine

## 2019-09-07 ENCOUNTER — Other Ambulatory Visit: Payer: Self-pay

## 2019-09-07 ENCOUNTER — Other Ambulatory Visit (INDEPENDENT_AMBULATORY_CARE_PROVIDER_SITE_OTHER): Payer: Medicare HMO

## 2019-09-07 ENCOUNTER — Encounter: Payer: Self-pay | Admitting: Internal Medicine

## 2019-09-07 ENCOUNTER — Other Ambulatory Visit: Payer: Self-pay | Admitting: Internal Medicine

## 2019-09-07 VITALS — BP 116/70 | HR 71 | Temp 98.5°F | Ht 63.0 in | Wt 149.0 lb

## 2019-09-07 DIAGNOSIS — R7302 Impaired glucose tolerance (oral): Secondary | ICD-10-CM

## 2019-09-07 DIAGNOSIS — E559 Vitamin D deficiency, unspecified: Secondary | ICD-10-CM | POA: Diagnosis not present

## 2019-09-07 DIAGNOSIS — Z9889 Other specified postprocedural states: Secondary | ICD-10-CM | POA: Diagnosis not present

## 2019-09-07 DIAGNOSIS — K921 Melena: Secondary | ICD-10-CM | POA: Diagnosis not present

## 2019-09-07 DIAGNOSIS — E611 Iron deficiency: Secondary | ICD-10-CM | POA: Diagnosis not present

## 2019-09-07 DIAGNOSIS — E538 Deficiency of other specified B group vitamins: Secondary | ICD-10-CM

## 2019-09-07 DIAGNOSIS — I10 Essential (primary) hypertension: Secondary | ICD-10-CM

## 2019-09-07 DIAGNOSIS — N183 Chronic kidney disease, stage 3 unspecified: Secondary | ICD-10-CM | POA: Diagnosis not present

## 2019-09-07 DIAGNOSIS — R531 Weakness: Secondary | ICD-10-CM | POA: Diagnosis not present

## 2019-09-07 LAB — URINALYSIS, ROUTINE W REFLEX MICROSCOPIC
Bilirubin Urine: NEGATIVE
Ketones, ur: NEGATIVE
Leukocytes,Ua: NEGATIVE
Nitrite: NEGATIVE
Specific Gravity, Urine: 1.01 (ref 1.000–1.030)
Total Protein, Urine: NEGATIVE
Urine Glucose: NEGATIVE
Urobilinogen, UA: 0.2 (ref 0.0–1.0)
pH: 7 (ref 5.0–8.0)

## 2019-09-07 LAB — CBC WITH DIFFERENTIAL/PLATELET
Basophils Absolute: 0.1 10*3/uL (ref 0.0–0.1)
Basophils Relative: 0.6 % (ref 0.0–3.0)
Eosinophils Absolute: 0.1 10*3/uL (ref 0.0–0.7)
Eosinophils Relative: 0.5 % (ref 0.0–5.0)
HCT: 31.2 % — ABNORMAL LOW (ref 36.0–46.0)
Hemoglobin: 10.8 g/dL — ABNORMAL LOW (ref 12.0–15.0)
Lymphocytes Relative: 10.8 % — ABNORMAL LOW (ref 12.0–46.0)
Lymphs Abs: 1 10*3/uL (ref 0.7–4.0)
MCHC: 34.5 g/dL (ref 30.0–36.0)
MCV: 101.7 fl — ABNORMAL HIGH (ref 78.0–100.0)
Monocytes Absolute: 0.8 10*3/uL (ref 0.1–1.0)
Monocytes Relative: 8.1 % (ref 3.0–12.0)
Neutro Abs: 7.5 10*3/uL (ref 1.4–7.7)
Neutrophils Relative %: 80 % — ABNORMAL HIGH (ref 43.0–77.0)
Platelets: 192 10*3/uL (ref 150.0–400.0)
RBC: 3.07 Mil/uL — ABNORMAL LOW (ref 3.87–5.11)
RDW: 14.7 % (ref 11.5–15.5)
WBC: 9.3 10*3/uL (ref 4.0–10.5)

## 2019-09-07 LAB — LIPID PANEL
Cholesterol: 192 mg/dL (ref 0–200)
HDL: 65.4 mg/dL (ref >39.00)
LDL Cholesterol: 90 mg/dL (ref 0–99)
NonHDL: 126.15
Total CHOL/HDL Ratio: 3
Triglycerides: 179 mg/dL — ABNORMAL HIGH (ref 0.0–149.0)
VLDL: 35.8 mg/dL (ref 0.0–40.0)

## 2019-09-07 LAB — HEPATIC FUNCTION PANEL
ALT: 20 U/L (ref 0–35)
AST: 24 U/L (ref 0–37)
Albumin: 4.3 g/dL (ref 3.5–5.2)
Alkaline Phosphatase: 42 U/L (ref 39–117)
Bilirubin, Direct: 0.2 mg/dL (ref 0.0–0.3)
Total Bilirubin: 0.6 mg/dL (ref 0.2–1.2)
Total Protein: 6.7 g/dL (ref 6.0–8.3)

## 2019-09-07 LAB — BASIC METABOLIC PANEL
BUN: 38 mg/dL — ABNORMAL HIGH (ref 6–23)
CO2: 32 mEq/L (ref 19–32)
Calcium: 9.7 mg/dL (ref 8.4–10.5)
Chloride: 86 mEq/L — ABNORMAL LOW (ref 96–112)
Creatinine, Ser: 1.68 mg/dL — ABNORMAL HIGH (ref 0.40–1.20)
GFR: 35.31 mL/min — ABNORMAL LOW (ref >60.00)
Glucose, Bld: 119 mg/dL — ABNORMAL HIGH (ref 70–99)
Potassium: 3.2 mEq/L — ABNORMAL LOW (ref 3.5–5.1)
Sodium: 127 mEq/L — ABNORMAL LOW (ref 135–145)

## 2019-09-07 LAB — VITAMIN B12: Vitamin B-12: >1500 pg/mL — ABNORMAL HIGH (ref 211–911)

## 2019-09-07 LAB — IBC PANEL
Iron: 79 ug/dL (ref 42–145)
Saturation Ratios: 24.1 % (ref 20.0–50.0)
Transferrin: 234 mg/dL (ref 212.0–360.0)

## 2019-09-07 LAB — TSH: TSH: 1.42 u[IU]/mL (ref 0.35–4.50)

## 2019-09-07 LAB — VITAMIN D 25 HYDROXY (VIT D DEFICIENCY, FRACTURES): VITD: >120 ng/mL

## 2019-09-07 LAB — HEMOGLOBIN A1C: Hgb A1c MFr Bld: 5.2 % (ref 4.6–6.5)

## 2019-09-07 MED ORDER — POTASSIUM CHLORIDE ER 10 MEQ PO TBCR: EXTENDED_RELEASE_TABLET | ORAL | 3 refills | Status: DC

## 2019-09-07 NOTE — Assessment & Plan Note (Signed)
stable overall by history and exam, recent data reviewed with pt, and pt to continue medical treatment as before,  to f/u any worsening symptoms or concerns  

## 2019-09-07 NOTE — Assessment & Plan Note (Signed)
?   Related to new leflunomide? For labs, f/u rheumatology

## 2019-09-07 NOTE — Patient Instructions (Addendum)
Ok to try the increase gabapentin to 200 mg three times per day, and call if you need the new prescription for this  Please continue all other medications as before, though you may need to ask about the new rheumatology medication and fatigue  Please have the pharmacy call with any other refills you may need.  Please continue your efforts at being more active, low cholesterol diet, and weight control  Please keep your appointments with your specialists as you may have planned  Please go to the LAB in the Basement (turn left off the elevator) for the tests to be done today  You will be contacted by phone if any changes need to be made immediately.  Otherwise, you will receive a letter about your results with an explanation, but please check with MyChart first.  Please remember to sign up for MyChart if you have not done so, as this will be important to you in the future with finding out test results, communicating by private email, and scheduling acute appointments online when needed.  Please return in 4 months, or sooner if needed, with Lab testing done 3-5 days before

## 2019-09-07 NOTE — Progress Notes (Signed)
Subjective:    Patient ID: [name], female    DOB: 04/14/1936, 83 y.o.   MRN: [id number]  HPI here after had BM 2 nights ago, seemed normal, but saw BRB on tissue, more than just a drop. No pain.  None since.  Denies worsening reflux, abd pain, dysphagia, n/v, bowel change or blood.  Also with worsening gend weakness it sees. Seeing rheum with chronic bilateral shoulder pain, seemed to start the low energy and lower stamina with starting with rheum  May be taking the leflunomide per rheum as pt seems to recall that name. . Getting HH with PT for bilat frozen shoulders Last colonoscopy 2018 with diverticulosis and int hemorrhoid. Pt denies chest pain, increased sob or doe, wheezing, orthopnea, PND, increased LE swelling, palpitations, dizziness or syncope.  Pt denies new neurological symptoms such as new headache, or facial or extremity weakness or numbness   Pt denies polydipsia, polyuria.  Pt continues to have recurring LBP without change in severity, bowel or bladder change, fever, wt loss,  worsening LE pain/numbness/weakness, gait change or falls.Does c/o ongoing fatigue, but denies signficant daytime hypersomnolence. Past Medical History:  Diagnosis Date  . Allergic rhinitis 10/30/2016  . Anemia   . Asthma   . Breast cancer of upper-outer quadrant of left female breast ([place]) 11/08/2013   ER/PR+ Her2- Left IDC   . Chronic renal insufficiency   . Chronic rhinitis   . Colon polyp   . Diastolic dysfunction 12/21/3333  . DJD (degenerative joint disease)   . Dyspnea   . Full dentures   . GERD (gastroesophageal reflux disease)   . Hearing loss   . Hypertension   . Hyponatremia   . Impaired glucose tolerance 07/18/2014  . Memory loss   . Morbid obesity ([place])   . Poor circulation   . Vertigo   . Wears glasses    Past Surgical History:  Procedure Laterality Date  . ABDOMINAL HYSTERECTOMY    . BREAST LUMPECTOMY WITH NEEDLE LOCALIZATION AND AXILLARY SENTINEL LYMPH NODE BX Left  12/04/2013   Procedure: BREAST LUMPECTOMY WITH NEEDLE LOCALIZATION AND AXILLARY SENTINEL LYMPH NODE BX;  Surgeon: Shann Medal, MD;  Location: [facility];  Service: General;  Laterality: Left;  . CATARACT EXTRACTION  2009   rt  . COLONOSCOPY    . EYE SURGERY Bilateral    cataract surgery  . KNEE ARTHROSCOPY     both  . LUMBAR LAMINECTOMY/DECOMPRESSION MICRODISCECTOMY Left 12/23/2017   Procedure: Left Lumbar One-Two Laminectomy with microdiscectomy;  Surgeon: Eustace Moore, MD;  Location: [facility];  Service: Neurosurgery;  Laterality: Left;  Left L1-2 Laminectomy with microdiscectomy  . TONSILLECTOMY    . TOTAL KNEE ARTHROPLASTY  2002   rt  . TOTAL KNEE ARTHROPLASTY  2003   left  . VESICOVAGINAL FISTULA CLOSURE W/ TAH  1980    reports that she has never smoked. She has never used smokeless tobacco. She reports that she does not drink alcohol or use drugs. family history includes Colon cancer in her mother; Heart attack (age of onset: 75) in her son; Heart disease in her unknown relative; Lung cancer in her brother; Stomach cancer in her mother. Allergies  Allergen Reactions  . Hydrocodone Itching  . Tizanidine Other (See Comments)    Dizzy and fall  . Iron Hives and Other (See Comments)    [place], bad constipation   Current Outpatient Medications on File Prior to Visit  Medication Sig Dispense Refill  .  albuterol (PROVENTIL HFA;VENTOLIN HFA) 108 (90 Base) MCG/ACT inhaler Inhale 2 puffs into the lungs every 6 (six) hours as needed for wheezing or shortness of breath. 1 Inhaler 11  . amLODipine (NORVASC) 5 MG tablet TAKE 1 TABLET BY MOUTH EVERY DAY 90 tablet 1  . aspirin 81 MG tablet Take 81 mg by mouth daily.      . Ensure (ENSURE) Take 1 Can by mouth 3 (three) times daily between meals. 237 mL 12  . erythromycin ophthalmic ointment Place 1 application into the left eye 4 (four) times daily. For 10 days 3.5 g 0  . FERREX 150 150 MG capsule TAKE 1 CAPSULE BY MOUTH TWICE A  DAY 60 capsule 1  . Fluticasone-Salmeterol (ADVAIR DISKUS) 250-50 MCG/DOSE AEPB Inhale 1 puff into the lungs 2 (two) times daily. 180 each 3  . furosemide (LASIX) 20 MG tablet TAKE 1 TABLET BY MOUTH EVERY DAY AS NEEDED 90 tablet 1  . gabapentin (NEURONTIN) 100 MG capsule TAKE 1 CAPSULE BY MOUTH THREE TIMES A DAY 270 capsule 1  . hydrOXYzine (VISTARIL) 50 MG capsule Take 1 capsule (50 mg total) by mouth at bedtime as needed for itching. 30 capsule 1  . Incontinence Supply Disposable (DEPEND UNDERWEAR SM/MED) MISC Use as directed four times per day 120 each 5  . losartan-hydrochlorothiazide (HYZAAR) 100-25 MG tablet TAKE 1 TABLET BY MOUTH EVERY DAY 90 tablet 1  . meclizine (ANTIVERT) 12.5 MG tablet TAKE 1 TABLET BY MOUTH THREE TIMES A DAY AS NEEDED FOR DIZZINESS 270 tablet 0  . methocarbamol (ROBAXIN) 500 MG tablet Take 1 tablet (500 mg total) by mouth every 8 (eight) hours as needed for muscle spasms. 10 tablet 0  . mometasone-formoterol (DULERA) 200-5 MCG/ACT AERO Inhale 2 puffs into the lungs 2 (two) times daily. 13 g 11  . montelukast (SINGULAIR) 10 MG tablet TAKE 1 TABLET BY MOUTH EVERY DAY 90 tablet 3  . Multiple Vitamins-Minerals (CENTRUM SILVER PO) Take 1 tablet by mouth daily.      . pantoprazole (PROTONIX) 40 MG tablet TAKE 1 TABLET BY MOUTH EVERY DAY 90 tablet 1  . Potassium Gluconate 550 (90 K) MG TABS Take 550 mg by mouth daily.     . predniSONE (DELTASONE) 10 MG tablet 3 tabs by mouth per day for 3 days,2tabs per day for 3 days,1tab per day for 3 days 18 tablet 0  . Simethicone (GAS-X PO) Take 1 tablet by mouth daily as needed (for gas).     . solifenacin (VESICARE) 5 MG tablet Take 1 tablet (5 mg total) by mouth daily. 90 tablet 3  . tizanidine (ZANAFLEX) 2 MG capsule TAKE 1 CAPSULE BY MOUTH 3 TIMES A DAY AS NEEDED FOR MUSCLE SPASMS 40 capsule 1  . triamcinolone (NASACORT AQ) 55 MCG/ACT AERO nasal inhaler Place 2 sprays into the nose daily. 1 Inhaler 12  . triamcinolone cream  (KENALOG) 0.1 % Apply 1 application topically 2 (two) times daily. 30 g 0  . Vitamin D, Ergocalciferol, (DRISDOL) 1.25 MG (50000 UT) CAPS capsule Take 1 capsule (50,000 Units total) by mouth every 7 (seven) days. 12 capsule 0   No current facility-administered medications on file prior to visit.    Review of Systems  Constitutional: Negative for other unusual diaphoresis or sweats HENT: Negative for ear discharge or swelling Eyes: Negative for other worsening visual disturbances Respiratory: Negative for stridor or other swelling  Gastrointestinal: Negative for worsening distension or other blood Genitourinary: Negative for retention or other urinary change  Musculoskeletal: Negative for other MSK pain or swelling Skin: Negative for color change or other new lesions Neurological: Negative for worsening tremors and other numbness  Psychiatric/Behavioral: Negative for worsening agitation or other fatigue All otherwise neg per pt    Objective:   Physical Exam BP 116/70   Pulse 71   Temp 98.5 F (36.9 C) (Oral)   Ht '5\' 3"'$  (1.6 m)   Wt 149 lb (67.6 kg)   SpO2 97%   BMI 26.39 kg/m  VS noted, in wheelchair, general weakness Constitutional: Pt appears in NAD HENT: Head: NCAT.  Right Ear: External ear normal.  Left Ear: External ear normal.  Eyes: . Pupils are equal, round, and reactive to light. Conjunctivae and EOM are normal Nose: without d/c or deformity Neck: Neck supple. Gross normal ROM Cardiovascular: Normal rate and regular rhythm.   Pulmonary/Chest: Effort normal and breath sounds without rales or wheezing.  Abd:  Soft, NT, ND, + BS, no organomegaly Neurological: Pt is alert. At baseline orientation, motor grossly intact Skin: Skin is warm. No rashes, other new lesions, no LE edema Psychiatric: Pt behavior is normal without agitation  All otherwise neg per pt  Lab Results  Component Value Date   WBC 12.0 (H) 04/13/2019   HGB 11.1 (L) 04/13/2019   HCT 33.3 (L)  04/13/2019   PLT 241.0 04/13/2019   GLUCOSE 99 04/13/2019   CHOL 163 04/13/2019   TRIG 83.0 04/13/2019   HDL 81.30 04/13/2019   LDLCALC 65 04/13/2019   ALT 23 04/13/2019   AST 22 04/13/2019   NA 129 (L) 04/13/2019   K 3.5 04/13/2019   CL 90 (L) 04/13/2019   CREATININE 1.57 (H) 04/13/2019   BUN 46 (H) 04/13/2019   CO2 31 04/13/2019   TSH 2.29 04/13/2019   INR 1.35 01/17/2018   HGBA1C 5.7 04/13/2019      Assessment & Plan:

## 2019-09-07 NOTE — Assessment & Plan Note (Addendum)
For cbc with labs, cont to follow, declines GI referral  Note:  Total time for pt hx, exam, review of record with pt in the room, determination of diagnoses and plan for further eval and tx is > 40 min, with over 50% spent in coordination and counseling of patient including the differential dx, tx, further evaluation and other management of hematochezia, generalized weakness, LBP, CKD, HTN, hyperglycmia

## 2019-09-07 NOTE — Assessment & Plan Note (Signed)
To consider increaed gabapentin 200 tid

## 2019-09-08 ENCOUNTER — Ambulatory Visit: Payer: Medicare HMO | Admitting: Internal Medicine

## 2019-09-08 DIAGNOSIS — R69 Illness, unspecified: Secondary | ICD-10-CM | POA: Diagnosis not present

## 2019-09-11 ENCOUNTER — Ambulatory Visit: Payer: Medicare HMO | Admitting: Internal Medicine

## 2019-09-11 ENCOUNTER — Telehealth: Payer: Self-pay | Admitting: Internal Medicine

## 2019-09-11 MED ORDER — FLUTICASONE-SALMETEROL 250-50 MCG/DOSE IN AEPB: 1.0000 | INHALATION_SPRAY | Freq: Two times a day (BID) | RESPIRATORY_TRACT | 3 refills | Status: DC

## 2019-09-11 NOTE — Telephone Encounter (Signed)
Pt called in to be advised. Pt says that she is prescribed  albuterol (PROVENTIL HFA;VENTOLIN HFA) 108 (90 Base) MCG/ACT inhaler and feels that it is not helping as much as it usually does,  pt would like to know if she should continue with this inhaler or can PCP make a change to Rx. Pt says that her daughter noticed that she is wheezing some now.   CB: [phone] - please advise.

## 2019-09-11 NOTE — Telephone Encounter (Signed)
Notified pt w/MD response.../lmb 

## 2019-09-11 NOTE — Telephone Encounter (Signed)
Ok to restart the advair - done erx, as there is no stronger albuterol to taek

## 2019-09-12 ENCOUNTER — Other Ambulatory Visit: Payer: Self-pay | Admitting: Internal Medicine

## 2019-09-15 DIAGNOSIS — R351 Nocturia: Secondary | ICD-10-CM | POA: Diagnosis not present

## 2019-09-15 DIAGNOSIS — R350 Frequency of micturition: Secondary | ICD-10-CM | POA: Diagnosis not present

## 2019-10-03 DIAGNOSIS — Z6828 Body mass index (BMI) 28.0-28.9, adult: Secondary | ICD-10-CM | POA: Diagnosis not present

## 2019-10-03 DIAGNOSIS — Z01419 Encounter for gynecological examination (general) (routine) without abnormal findings: Secondary | ICD-10-CM | POA: Diagnosis not present

## 2019-10-04 ENCOUNTER — Other Ambulatory Visit: Payer: Self-pay | Admitting: Internal Medicine

## 2019-10-18 ENCOUNTER — Other Ambulatory Visit: Payer: Self-pay | Admitting: Internal Medicine

## 2019-10-25 ENCOUNTER — Other Ambulatory Visit: Payer: Self-pay | Admitting: Internal Medicine

## 2019-11-06 DIAGNOSIS — M7500 Adhesive capsulitis of unspecified shoulder: Secondary | ICD-10-CM | POA: Diagnosis not present

## 2019-11-06 DIAGNOSIS — Z79899 Other long term (current) drug therapy: Secondary | ICD-10-CM | POA: Diagnosis not present

## 2019-11-06 DIAGNOSIS — M1180 Other specified crystal arthropathies, unspecified site: Secondary | ICD-10-CM | POA: Diagnosis not present

## 2019-11-06 DIAGNOSIS — M791 Myalgia, unspecified site: Secondary | ICD-10-CM | POA: Diagnosis not present

## 2019-11-06 DIAGNOSIS — M25519 Pain in unspecified shoulder: Secondary | ICD-10-CM | POA: Diagnosis not present

## 2019-11-06 DIAGNOSIS — Z13820 Encounter for screening for osteoporosis: Secondary | ICD-10-CM | POA: Diagnosis not present

## 2019-11-06 DIAGNOSIS — D649 Anemia, unspecified: Secondary | ICD-10-CM | POA: Diagnosis not present

## 2019-11-06 DIAGNOSIS — R700 Elevated erythrocyte sedimentation rate: Secondary | ICD-10-CM | POA: Diagnosis not present

## 2019-11-06 DIAGNOSIS — M1990 Unspecified osteoarthritis, unspecified site: Secondary | ICD-10-CM | POA: Diagnosis not present

## 2019-11-06 DIAGNOSIS — M79643 Pain in unspecified hand: Secondary | ICD-10-CM | POA: Diagnosis not present

## 2019-11-15 NOTE — Telephone Encounter (Signed)
No entry 

## 2019-11-17 ENCOUNTER — Telehealth: Payer: Self-pay | Admitting: *Deleted

## 2019-11-17 MED ORDER — GABAPENTIN 300 MG PO CAPS: 300.0000 mg | ORAL_CAPSULE | Freq: Three times a day (TID) | ORAL | 5 refills | Status: DC

## 2019-11-17 NOTE — Telephone Encounter (Signed)
I called pt-  1. Her neurologist suggested her gabapentin be increased. She is requesting PCP increase the dosage.  2. She also wants to if she can take the COVID-19 vaccine. Please advise.

## 2019-11-17 NOTE — Telephone Encounter (Signed)
Copied from [place] [id number]. Topic: General - Other >> Nov 16, 2019  8:01 AM Keene Breath wrote: Reason for CRM: Patient would like the nurse or doctor to call regarding her medication, gabapentin (NEURONTIN) 100 MG capsule and whether she is a candidate for the vaccine.  CB# [phone]

## 2019-11-17 NOTE — Telephone Encounter (Signed)
Gaba increased to 300 tid  Ok for covid vaccine - I would highly encourage

## 2019-11-17 NOTE — Telephone Encounter (Signed)
Pt informed of below.  

## 2019-11-20 ENCOUNTER — Other Ambulatory Visit: Payer: Self-pay | Admitting: Internal Medicine

## 2019-12-01 ENCOUNTER — Other Ambulatory Visit: Payer: Self-pay | Admitting: Internal Medicine

## 2019-12-07 ENCOUNTER — Ambulatory Visit (INDEPENDENT_AMBULATORY_CARE_PROVIDER_SITE_OTHER): Payer: Medicare HMO | Admitting: Internal Medicine

## 2019-12-07 ENCOUNTER — Encounter: Payer: Self-pay | Admitting: Internal Medicine

## 2019-12-07 ENCOUNTER — Other Ambulatory Visit: Payer: Self-pay

## 2019-12-07 VITALS — BP 144/72 | HR 81 | Temp 99.0°F | Ht 63.0 in | Wt 161.0 lb

## 2019-12-07 DIAGNOSIS — N3281 Overactive bladder: Secondary | ICD-10-CM | POA: Insufficient documentation

## 2019-12-07 DIAGNOSIS — M79671 Pain in right foot: Secondary | ICD-10-CM

## 2019-12-07 DIAGNOSIS — R7302 Impaired glucose tolerance (oral): Secondary | ICD-10-CM

## 2019-12-07 DIAGNOSIS — E871 Hypo-osmolality and hyponatremia: Secondary | ICD-10-CM

## 2019-12-07 DIAGNOSIS — E876 Hypokalemia: Secondary | ICD-10-CM | POA: Diagnosis not present

## 2019-12-07 DIAGNOSIS — R509 Fever, unspecified: Secondary | ICD-10-CM

## 2019-12-07 DIAGNOSIS — Z0001 Encounter for general adult medical examination with abnormal findings: Secondary | ICD-10-CM

## 2019-12-07 DIAGNOSIS — Z0000 Encounter for general adult medical examination without abnormal findings: Secondary | ICD-10-CM | POA: Diagnosis not present

## 2019-12-07 LAB — CBC WITH DIFFERENTIAL/PLATELET
Basophils Absolute: 0.1 10*3/uL (ref 0.0–0.1)
Basophils Relative: 0.4 % (ref 0.0–3.0)
Eosinophils Absolute: 0 10*3/uL (ref 0.0–0.7)
Eosinophils Relative: 0.3 % (ref 0.0–5.0)
HCT: 29.9 % — ABNORMAL LOW (ref 36.0–46.0)
Hemoglobin: 9.9 g/dL — ABNORMAL LOW (ref 12.0–15.0)
Lymphocytes Relative: 9.1 % — ABNORMAL LOW (ref 12.0–46.0)
Lymphs Abs: 1.3 10*3/uL (ref 0.7–4.0)
MCHC: 33.1 g/dL (ref 30.0–36.0)
MCV: 100 fl (ref 78.0–100.0)
Monocytes Absolute: 0.9 10*3/uL (ref 0.1–1.0)
Monocytes Relative: 6.3 % (ref 3.0–12.0)
Neutro Abs: 12.1 10*3/uL — ABNORMAL HIGH (ref 1.4–7.7)
Neutrophils Relative %: 83.9 % — ABNORMAL HIGH (ref 43.0–77.0)
Platelets: 254 10*3/uL (ref 150.0–400.0)
RBC: 2.99 Mil/uL — ABNORMAL LOW (ref 3.87–5.11)
RDW: 13.5 % (ref 11.5–15.5)
WBC: 14.4 10*3/uL — ABNORMAL HIGH (ref 4.0–10.5)

## 2019-12-07 LAB — URINALYSIS, ROUTINE W REFLEX MICROSCOPIC
Bilirubin Urine: NEGATIVE
Ketones, ur: NEGATIVE
Leukocytes,Ua: NEGATIVE
Nitrite: NEGATIVE
Specific Gravity, Urine: 1.01 (ref 1.000–1.030)
Total Protein, Urine: NEGATIVE
Urine Glucose: NEGATIVE
Urobilinogen, UA: 0.2 (ref 0.0–1.0)
pH: 6.5 (ref 5.0–8.0)

## 2019-12-07 LAB — BASIC METABOLIC PANEL
BUN: 44 mg/dL — ABNORMAL HIGH (ref 6–23)
CO2: 31 mEq/L (ref 19–32)
Calcium: 9.6 mg/dL (ref 8.4–10.5)
Chloride: 95 mEq/L — ABNORMAL LOW (ref 96–112)
Creatinine, Ser: 1.73 mg/dL — ABNORMAL HIGH (ref 0.40–1.20)
GFR: 34.11 mL/min — ABNORMAL LOW (ref >60.00)
Glucose, Bld: 99 mg/dL (ref 70–99)
Potassium: 3.7 mEq/L (ref 3.5–5.1)
Sodium: 135 mEq/L (ref 135–145)

## 2019-12-07 LAB — LIPID PANEL
Cholesterol: 183 mg/dL (ref 0–200)
HDL: 77.5 mg/dL (ref >39.00)
LDL Cholesterol: 90 mg/dL (ref 0–99)
NonHDL: 105.74
Total CHOL/HDL Ratio: 2
Triglycerides: 78 mg/dL (ref 0.0–149.0)
VLDL: 15.6 mg/dL (ref 0.0–40.0)

## 2019-12-07 LAB — HEMOGLOBIN A1C: Hgb A1c MFr Bld: 5.8 % (ref 4.6–6.5)

## 2019-12-07 LAB — HEPATIC FUNCTION PANEL
ALT: 21 U/L (ref 0–35)
AST: 21 U/L (ref 0–37)
Albumin: 3.7 g/dL (ref 3.5–5.2)
Alkaline Phosphatase: 49 U/L (ref 39–117)
Bilirubin, Direct: 0.1 mg/dL (ref 0.0–0.3)
Total Bilirubin: 0.5 mg/dL (ref 0.2–1.2)
Total Protein: 6.9 g/dL (ref 6.0–8.3)

## 2019-12-07 LAB — TSH: TSH: 2.21 u[IU]/mL (ref 0.35–4.50)

## 2019-12-07 NOTE — Patient Instructions (Addendum)
You only need to take the lasix for swelling that is getting worse in the legs.    Please obtain heel cushions to use at night to protect the heels from the pressure and pain  Please continue all other medications as before, and refills have been done if requested.  Please have the pharmacy call with any other refills you may need.  Please continue your efforts at being more active, low cholesterol diet, and weight control.  You are otherwise up to date with prevention measures today.  Please keep your appointments with your specialists as you may have planned  Please go to the LAB at the blood drawing area for the tests to be done  You will be contacted by phone if any changes need to be made immediately.  Otherwise, you will receive a letter about your results with an explanation, but please check with MyChart first.  Please remember to sign up for MyChart if you have not done so, as this will be important to you in the future with finding out test results, communicating by private email, and scheduling acute appointments online when needed.  Please make an Appointment to return in 6 months, or sooner if needed

## 2019-12-07 NOTE — Progress Notes (Signed)
Subjective:    Patient ID: [name], female    DOB: 1936/09/28, 84 y.o.   MRN: [id number]  HPI Here for wellness and f/u;  Overall doing ok;  Pt denies Chest pain, worsening SOB, DOE, wheezing, orthopnea, PND, worsening LE edema, palpitations, dizziness or syncope.  Pt denies neurological change such as new headache, facial or extremity weakness.  Pt denies polydipsia, polyuria, or low sugar symptoms. Pt states overall good compliance with treatment and medications, good tolerability, and has been trying to follow appropriate diet.  Pt denies worsening depressive symptoms, suicidal ideation or panic. No fever, night sweats, wt loss, loss of appetite, or other constitutional symptoms.  Pt states good ability with ADL's, has low fall risk, home safety reviewed and adequate, no other significant changes in hearing or vision, and only occasionally active with exercise.  Has some urinary frequency during the day maybe from the fluid pills, but fortunately none at night with the vesicare.  Has some low grade fever today but Denies urinary symptoms such as dysuria, frequency, urgency, flank pain, hematuria or n/v, fever, chills. Does have a right callous heel more tender than usual recently but forced to sleep on her back due to her bilateral frozen shoulders.   Past Medical History:  Diagnosis Date  . Allergic rhinitis 10/30/2016  . Anemia   . Asthma   . Breast cancer of upper-outer quadrant of left female breast ([place]) 11/08/2013   ER/PR+ Her2- Left IDC   . Chronic renal insufficiency   . Chronic rhinitis   . Colon polyp   . Diastolic dysfunction 07/19/2682  . DJD (degenerative joint disease)   . Dyspnea   . Full dentures   . GERD (gastroesophageal reflux disease)   . Hearing loss   . Hypertension   . Hyponatremia   . Impaired glucose tolerance 07/18/2014  . Memory loss   . Morbid obesity ([place])   . Poor circulation   . Vertigo   . Wears glasses    Past Surgical History:  Procedure  Laterality Date  . ABDOMINAL HYSTERECTOMY    . BREAST LUMPECTOMY WITH NEEDLE LOCALIZATION AND AXILLARY SENTINEL LYMPH NODE BX Left 12/04/2013   Procedure: BREAST LUMPECTOMY WITH NEEDLE LOCALIZATION AND AXILLARY SENTINEL LYMPH NODE BX;  Surgeon: Shann Medal, MD;  Location: [facility];  Service: General;  Laterality: Left;  . CATARACT EXTRACTION  2009   rt  . COLONOSCOPY    . EYE SURGERY Bilateral    cataract surgery  . KNEE ARTHROSCOPY     both  . LUMBAR LAMINECTOMY/DECOMPRESSION MICRODISCECTOMY Left 12/23/2017   Procedure: Left Lumbar One-Two Laminectomy with microdiscectomy;  Surgeon: Eustace Moore, MD;  Location: [facility];  Service: Neurosurgery;  Laterality: Left;  Left L1-2 Laminectomy with microdiscectomy  . TONSILLECTOMY    . TOTAL KNEE ARTHROPLASTY  2002   rt  . TOTAL KNEE ARTHROPLASTY  2003   left  . VESICOVAGINAL FISTULA CLOSURE W/ TAH  1980    reports that she has never smoked. She has never used smokeless tobacco. She reports that she does not drink alcohol or use drugs. family history includes Colon cancer in her mother; Heart attack (age of onset: 81) in her son; Heart disease in an other family member; Lung cancer in her brother; Stomach cancer in her mother. Allergies  Allergen Reactions  . Hydrocodone Itching  . Tizanidine Other (See Comments)    Dizzy and fall  . Iron Hives and Other (See Comments)  Whelps, bad constipation   Current Outpatient Medications on File Prior to Visit  Medication Sig Dispense Refill  . albuterol (PROVENTIL HFA;VENTOLIN HFA) 108 (90 Base) MCG/ACT inhaler Inhale 2 puffs into the lungs every 6 (six) hours as needed for wheezing or shortness of breath. 1 Inhaler 11  . amLODipine (NORVASC) 5 MG tablet Take 1 tablet (5 mg total) by mouth daily. Annual appt due in must see provider for future refills 30 tablet 0  . aspirin 81 MG tablet Take 81 mg by mouth daily.      . Ensure (ENSURE) Take 1 Can by mouth 3 (three) times daily  between meals. 237 mL 12  . erythromycin ophthalmic ointment Place 1 application into the left eye 4 (four) times daily. For 10 days 3.5 g 0  . FERREX 150 150 MG capsule TAKE 1 CAPSULE BY MOUTH TWICE A DAY 60 capsule 1  . Fluticasone-Salmeterol (ADVAIR DISKUS) 250-50 MCG/DOSE AEPB Inhale 1 puff into the lungs 2 (two) times daily. 180 each 3  . furosemide (LASIX) 20 MG tablet TAKE 1 TABLET BY MOUTH EVERY DAY AS NEEDED 90 tablet 1  . gabapentin (NEURONTIN) 300 MG capsule Take 1 capsule (300 mg total) by mouth 3 (three) times daily. 90 capsule 5  . hydrOXYzine (VISTARIL) 50 MG capsule Take 1 capsule (50 mg total) by mouth at bedtime as needed for itching. 30 capsule 1  . Incontinence Supply Disposable (DEPEND UNDERWEAR SM/MED) MISC Use as directed four times per day 120 each 5  . losartan-hydrochlorothiazide (HYZAAR) 100-25 MG tablet TAKE 1 TABLET BY MOUTH EVERY DAY 90 tablet 1  . meclizine (ANTIVERT) 12.5 MG tablet TAKE 1 TABLET BY MOUTH THREE TIMES A DAY AS NEEDED FOR DIZZINESS 270 tablet 0  . methocarbamol (ROBAXIN) 500 MG tablet Take 1 tablet (500 mg total) by mouth every 8 (eight) hours as needed for muscle spasms. 10 tablet 0  . montelukast (SINGULAIR) 10 MG tablet TAKE 1 TABLET BY MOUTH EVERY DAY 90 tablet 3  . Multiple Vitamins-Minerals (CENTRUM SILVER PO) Take 1 tablet by mouth daily.      . pantoprazole (PROTONIX) 40 MG tablet TAKE 1 TABLET BY MOUTH EVERY DAY 90 tablet 1  . potassium chloride (KLOR-CON) 10 MEQ tablet Tab by mouth daily when taking lasix 90 tablet 3  . Potassium Gluconate 550 (90 K) MG TABS Take 550 mg by mouth daily.     . predniSONE (DELTASONE) 10 MG tablet 3 tabs by mouth per day for 3 days,2tabs per day for 3 days,1tab per day for 3 days 18 tablet 0  . Simethicone (GAS-X PO) Take 1 tablet by mouth daily as needed (for gas).     . solifenacin (VESICARE) 5 MG tablet Take 1 tablet (5 mg total) by mouth daily. 90 tablet 3  . tizanidine (ZANAFLEX) 2 MG capsule TAKE 1 CAPSULE  BY MOUTH 3 TIMES A DAY AS NEEDED FOR MUSCLE SPASMS 40 capsule 1  . triamcinolone (NASACORT AQ) 55 MCG/ACT AERO nasal inhaler Place 2 sprays into the nose daily. 1 Inhaler 12  . triamcinolone cream (KENALOG) 0.1 % APPLY TO AFFECTED AREA TWICE A DAY 30 g 0  . Vitamin D, Ergocalciferol, (DRISDOL) 1.25 MG (50000 UT) CAPS capsule Take 1 capsule (50,000 Units total) by mouth every 7 (seven) days. 12 capsule 0   No current facility-administered medications on file prior to visit.   Review of Systems All otherwise neg per pt     Objective:   Physical Exam BP [name])  144/72   Pulse 81   Temp 99 F (37.2 C)   Ht _0  (1.6 m)   Wt 161 lb (73 kg)   SpO2 99%   BMI 28.52 kg/m  VS noted,  Constitutional: Pt appears in NAD HENT: Head: NCAT.  Right Ear: External ear normal.  Left Ear: External ear normal.  Eyes: . Pupils are equal, round, and reactive to light. Conjunctivae and EOM are normal Nose: without d/c or deformity Neck: Neck supple. Gross normal ROM Cardiovascular: Normal rate and regular rhythm.   Pulmonary/Chest: Effort normal and breath sounds without rales or wheezing.  Abd:  Soft, NT, ND, + BS, no organomegaly Neurological: Pt is alert. At baseline orientation, motor grossly intact Skin: Skin is warm. No rashes, other new lesions, no LE edema Psychiatric: Pt behavior is normal without agitation  All otherwise neg per pt  Lab Results  Component Value Date   WBC 9.3 09/07/2019   HGB 10.8 (L) 09/07/2019   HCT 31.2 (L) 09/07/2019   PLT 192.0 09/07/2019   GLUCOSE 119 (H) 09/07/2019   CHOL 192 09/07/2019   TRIG 179.0 (H) 09/07/2019   HDL 65.40 09/07/2019   LDLCALC 90 09/07/2019   ALT 20 09/07/2019   AST 24 09/07/2019   NA 127 (L) 09/07/2019   K 3.2 (L) 09/07/2019   CL 86 (L) 09/07/2019   CREATININE 1.68 (H) 09/07/2019   BUN 38 (H) 09/07/2019   CO2 32 09/07/2019   TSH 1.42 09/07/2019   INR 1.35 01/17/2018   HGBA1C 5.2 09/07/2019      Assessment & Plan:

## 2019-12-07 NOTE — Assessment & Plan Note (Signed)
For heel cushions qhs

## 2019-12-07 NOTE — Assessment & Plan Note (Signed)

## 2019-12-07 NOTE — Assessment & Plan Note (Signed)
Stable, for med refill 

## 2019-12-07 NOTE — Assessment & Plan Note (Addendum)
Etiology unclear, exam benign, for urine cx  I spent 32 minutes preparing to see the patient by review of recent labs, imaging and procedures, obtaining and reviewing separately obtained history, communicating with the patient and family or caregiver, ordering medications, tests or procedures, and documenting clinical information in the EHR including the differential Dx, treatment, and any further evaluation and other management of log grade fever, OAB, hyperglycemia, hyponatremia, hypokalemia, right heel pain

## 2019-12-07 NOTE — Assessment & Plan Note (Signed)
For lab f/u 

## 2019-12-07 NOTE — Assessment & Plan Note (Signed)
stable overall by history and exam, recent data reviewed with pt, and pt to continue medical treatment as before,  to f/u any worsening symptoms or concerns  

## 2019-12-08 LAB — URINE CULTURE: Result:: NO GROWTH

## 2019-12-11 ENCOUNTER — Ambulatory Visit: Payer: Medicare HMO | Attending: Internal Medicine

## 2019-12-11 DIAGNOSIS — Z23 Encounter for immunization: Secondary | ICD-10-CM | POA: Insufficient documentation

## 2019-12-11 NOTE — Progress Notes (Signed)
   Covid-19 Vaccination Clinic  Name:  [name]    MRN: [id number] DOB: [birth year]  12/11/2019  [name] was observed post Covid-19 immunization for 15 minutes without incidence. She was provided with Vaccine Information Sheet and instruction to access the V-Safe system.   [name] was instructed to call 911 with any severe reactions post vaccine: [name] Difficulty breathing  . Swelling of your face and throat  . A fast heartbeat  . A bad rash all over your body  . Dizziness and weakness    Immunizations Administered    Name Date Dose VIS Date Route   Pfizer COVID-19 Vaccine 12/11/2019  4:38 PM 0.3 mL 10/06/2019 Intramuscular   Manufacturer: [place]   Lot: [id number]   [place]: [id number]

## 2019-12-13 ENCOUNTER — Other Ambulatory Visit: Payer: Self-pay | Admitting: Internal Medicine

## 2019-12-13 NOTE — Telephone Encounter (Signed)
Please refill as per office routine med refill policy (all routine meds refilled for 3 mo or monthly per pt preference up to one year from last visit, then month to month grace period for 3 mo, then further med refills will have to be denied)  

## 2020-01-02 ENCOUNTER — Ambulatory Visit: Payer: Medicare HMO | Attending: Internal Medicine

## 2020-01-02 DIAGNOSIS — Z23 Encounter for immunization: Secondary | ICD-10-CM | POA: Insufficient documentation

## 2020-01-02 NOTE — Progress Notes (Signed)
   Covid-19 Vaccination Clinic  Name:  [name]    MRN: [id number] DOB: [birth year]  01/02/2020  [name] was observed post Covid-19 immunization for 15 minutes without incident. She was provided with Vaccine Information Sheet and instruction to access the V-Safe system.   [name] was instructed to call 911 with any severe reactions post vaccine: [name] Difficulty breathing  . Swelling of face and throat  . A fast heartbeat  . A bad rash all over body  . Dizziness and weakness   Immunizations Administered    Name Date Dose VIS Date Route   Pfizer COVID-19 Vaccine 01/02/2020  9:28 AM 0.3 mL 10/06/2019 Intramuscular   Manufacturer: [place]   Lot: [id number]   [place]: [id number]

## 2020-01-03 ENCOUNTER — Ambulatory Visit: Payer: Medicare HMO

## 2020-01-04 ENCOUNTER — Telehealth: Payer: Self-pay | Admitting: Internal Medicine

## 2020-01-04 MED ORDER — SOLIFENACIN SUCCINATE 5 MG PO TABS: 5.0000 mg | ORAL_TABLET | Freq: Every day | ORAL | 3 refills | Status: DC

## 2020-01-04 NOTE — Telephone Encounter (Signed)
Refill done.  

## 2020-01-04 NOTE — Telephone Encounter (Signed)
° ° ° ° °  Pt c/o medication issue:  1. Name of Medication: solifenacin (VESICARE) 5 MG tablet  2. How are you currently taking this medication (dosage and times per day)? As written  3. Are you having a reaction (difficulty breathing--STAT)? no  4. What is your medication issue? Patient calling, she is unsure if she needs to continue taking solifenacin . Please call

## 2020-01-04 NOTE — Telephone Encounter (Signed)
Called pt no answer LMOM MD sent rx to [facility] for her to continue taking.Marland KitchenJohny Acosta

## 2020-01-06 ENCOUNTER — Other Ambulatory Visit: Payer: Self-pay | Admitting: Internal Medicine

## 2020-01-06 NOTE — Telephone Encounter (Signed)
Please refill as per office routine med refill policy (all routine meds refilled for 3 mo or monthly per pt preference up to one year from last visit, then month to month grace period for 3 mo, then further med refills will have to be denied)  

## 2020-01-07 ENCOUNTER — Other Ambulatory Visit: Payer: Self-pay | Admitting: Internal Medicine

## 2020-01-07 NOTE — Telephone Encounter (Signed)
Please refill as per office routine med refill policy (all routine meds refilled for 3 mo or monthly per pt preference up to one year from last visit, then month to month grace period for 3 mo, then further med refills will have to be denied)  

## 2020-01-16 DIAGNOSIS — R928 Other abnormal and inconclusive findings on diagnostic imaging of breast: Secondary | ICD-10-CM | POA: Diagnosis not present

## 2020-02-05 DIAGNOSIS — M353 Polymyalgia rheumatica: Secondary | ICD-10-CM | POA: Diagnosis not present

## 2020-02-05 DIAGNOSIS — M791 Myalgia, unspecified site: Secondary | ICD-10-CM | POA: Diagnosis not present

## 2020-02-05 DIAGNOSIS — M1180 Other specified crystal arthropathies, unspecified site: Secondary | ICD-10-CM | POA: Diagnosis not present

## 2020-02-05 DIAGNOSIS — Z79899 Other long term (current) drug therapy: Secondary | ICD-10-CM | POA: Diagnosis not present

## 2020-02-05 DIAGNOSIS — M25519 Pain in unspecified shoulder: Secondary | ICD-10-CM | POA: Diagnosis not present

## 2020-02-05 DIAGNOSIS — R700 Elevated erythrocyte sedimentation rate: Secondary | ICD-10-CM | POA: Diagnosis not present

## 2020-02-05 DIAGNOSIS — D649 Anemia, unspecified: Secondary | ICD-10-CM | POA: Diagnosis not present

## 2020-02-05 DIAGNOSIS — M79643 Pain in unspecified hand: Secondary | ICD-10-CM | POA: Diagnosis not present

## 2020-02-05 DIAGNOSIS — M1990 Unspecified osteoarthritis, unspecified site: Secondary | ICD-10-CM | POA: Diagnosis not present

## 2020-02-05 DIAGNOSIS — M7500 Adhesive capsulitis of unspecified shoulder: Secondary | ICD-10-CM | POA: Diagnosis not present

## 2020-02-15 ENCOUNTER — Other Ambulatory Visit: Payer: Self-pay | Admitting: Internal Medicine

## 2020-02-26 ENCOUNTER — Other Ambulatory Visit: Payer: Self-pay | Admitting: Internal Medicine

## 2020-03-05 ENCOUNTER — Other Ambulatory Visit: Payer: Self-pay | Admitting: Internal Medicine

## 2020-04-01 ENCOUNTER — Telehealth: Payer: Self-pay

## 2020-04-01 DIAGNOSIS — M069 Rheumatoid arthritis, unspecified: Secondary | ICD-10-CM

## 2020-04-01 NOTE — Telephone Encounter (Signed)
New message    The patient voiced have questions regarding her arthritis and the recent referral appt with MD does not know his/ her name.   The patient voiced Dr. Jenny Reichmann referral her out to see a specialist.

## 2020-04-04 NOTE — Telephone Encounter (Signed)
    Patient requesting referral to Rheumatologist

## 2020-04-05 NOTE — Telephone Encounter (Signed)
Sent over to Dr. Jenny Reichmann.

## 2020-04-06 NOTE — Addendum Note (Signed)
Addended by: Biagio Borg on: 04/06/2020 03:29 PM   Modules accepted: Orders

## 2020-04-06 NOTE — Telephone Encounter (Signed)
Ok referral done 

## 2020-04-16 ENCOUNTER — Telehealth: Payer: Self-pay

## 2020-04-16 NOTE — Telephone Encounter (Signed)
New message   The patient is asking can the CMA call her to discuss   Dr. Fenton Malling office [phone] given Dr. Estanislado Pandy appt is 10/26

## 2020-04-19 NOTE — Telephone Encounter (Signed)
lvm for pt to call back and discuss.   I am not sure what patient is needing.

## 2020-04-23 ENCOUNTER — Ambulatory Visit (INDEPENDENT_AMBULATORY_CARE_PROVIDER_SITE_OTHER): Payer: Medicare HMO | Admitting: Internal Medicine

## 2020-04-23 ENCOUNTER — Encounter: Payer: Self-pay | Admitting: Internal Medicine

## 2020-04-23 ENCOUNTER — Other Ambulatory Visit: Payer: Self-pay

## 2020-04-23 VITALS — BP 100/68 | HR 65 | Temp 98.2°F | Ht 63.0 in | Wt 158.0 lb

## 2020-04-23 DIAGNOSIS — R7302 Impaired glucose tolerance (oral): Secondary | ICD-10-CM | POA: Diagnosis not present

## 2020-04-23 DIAGNOSIS — G894 Chronic pain syndrome: Secondary | ICD-10-CM

## 2020-04-23 DIAGNOSIS — I10 Essential (primary) hypertension: Secondary | ICD-10-CM | POA: Diagnosis not present

## 2020-04-23 DIAGNOSIS — R222 Localized swelling, mass and lump, trunk: Secondary | ICD-10-CM

## 2020-04-23 DIAGNOSIS — R6889 Other general symptoms and signs: Secondary | ICD-10-CM | POA: Insufficient documentation

## 2020-04-23 DIAGNOSIS — G8929 Other chronic pain: Secondary | ICD-10-CM | POA: Insufficient documentation

## 2020-04-23 DIAGNOSIS — M069 Rheumatoid arthritis, unspecified: Secondary | ICD-10-CM

## 2020-04-23 MED ORDER — TRAMADOL HCL ER 200 MG PO TB24: 200.0000 mg | ORAL_TABLET | Freq: Every day | ORAL | 1 refills | Status: DC

## 2020-04-23 NOTE — Progress Notes (Signed)
Subjective:    Patient ID: [name], female    DOB: 07/19/1936, 84 y.o.   MRN: [id number]  HPI  Here with daughter regarding primarily pain; has been referred to rheum with soonest appt oct 2021 and remains on prednisone, has various other pain including intermittent right shoulder pain; asks for pain tx for now and referral to pain management.  Pt denies chest pain, increased sob or doe, wheezing, orthopnea, PND, increased LE swelling, palpitations, dizziness or syncope.  Pt denies new neurological symptoms such as new headache, or facial or extremity weakness or numbness   Pt denies polydipsia, polyuria.  Also has 2 mo onset painless fatty fullness to the left supraclavicular area.   Past Medical History:  Diagnosis Date  . Allergic rhinitis 10/30/2016  . Anemia   . Asthma   . Breast cancer of upper-outer quadrant of left female breast ([place]) 11/08/2013   ER/PR+ Her2- Left IDC   . Chronic renal insufficiency   . Chronic rhinitis   . Colon polyp   . Diastolic dysfunction 7/90/2409  . DJD (degenerative joint disease)   . Dyspnea   . Full dentures   . GERD (gastroesophageal reflux disease)   . Hearing loss   . Hypertension   . Hyponatremia   . Impaired glucose tolerance 07/18/2014  . Memory loss   . Morbid obesity ([place])   . Poor circulation   . Vertigo   . Wears glasses    Past Surgical History:  Procedure Laterality Date  . ABDOMINAL HYSTERECTOMY    . BREAST LUMPECTOMY WITH NEEDLE LOCALIZATION AND AXILLARY SENTINEL LYMPH NODE BX Left 12/04/2013   Procedure: BREAST LUMPECTOMY WITH NEEDLE LOCALIZATION AND AXILLARY SENTINEL LYMPH NODE BX;  Surgeon: Shann Medal, MD;  Location: [facility];  Service: General;  Laterality: Left;  . CATARACT EXTRACTION  2009   rt  . COLONOSCOPY    . EYE SURGERY Bilateral    cataract surgery  . KNEE ARTHROSCOPY     both  . LUMBAR LAMINECTOMY/DECOMPRESSION MICRODISCECTOMY Left 12/23/2017   Procedure: Left Lumbar One-Two  Laminectomy with microdiscectomy;  Surgeon: Eustace Moore, MD;  Location: [facility];  Service: Neurosurgery;  Laterality: Left;  Left L1-2 Laminectomy with microdiscectomy  . TONSILLECTOMY    . TOTAL KNEE ARTHROPLASTY  2002   rt  . TOTAL KNEE ARTHROPLASTY  2003   left  . VESICOVAGINAL FISTULA CLOSURE W/ TAH  1980    reports that she has never smoked. She has never used smokeless tobacco. She reports that she does not drink alcohol and does not use drugs. family history includes Colon cancer in her mother; Heart attack (age of onset: 38) in her son; Heart disease in an other family member; Lung cancer in her brother; Stomach cancer in her mother. Allergies  Allergen Reactions  . Hydrocodone Itching  . Tizanidine Other (See Comments)    Dizzy and fall  . Iron Hives and Other (See Comments)    Whelps, bad constipation   Current Outpatient Medications on File Prior to Visit  Medication Sig Dispense Refill  . albuterol (PROVENTIL HFA;VENTOLIN HFA) 108 (90 Base) MCG/ACT inhaler Inhale 2 puffs into the lungs every 6 (six) hours as needed for wheezing or shortness of breath. 1 Inhaler 11  . amLODipine (NORVASC) 5 MG tablet TAKE 1 TABLET BY MOUTH EVERY DAY 90 tablet 3  . aspirin 81 MG tablet Take 81 mg by mouth daily.      . Ensure (ENSURE) Take 1  Can by mouth 3 (three) times daily between meals. 237 mL 12  . erythromycin ophthalmic ointment Place 1 application into the left eye 4 (four) times daily. For 10 days 3.5 g 0  . FERREX 150 150 MG capsule TAKE 1 CAPSULE BY MOUTH TWICE A DAY 60 capsule 1  . Fluticasone-Salmeterol (ADVAIR DISKUS) 250-50 MCG/DOSE AEPB Inhale 1 puff into the lungs 2 (two) times daily. 180 each 3  . furosemide (LASIX) 20 MG tablet TAKE 1 TABLET BY MOUTH EVERY DAY AS NEEDED 90 tablet 1  . gabapentin (NEURONTIN) 300 MG capsule Take 1 capsule (300 mg total) by mouth 3 (three) times daily. 90 capsule 5  . hydrOXYzine (VISTARIL) 50 MG capsule Take 1 capsule (50 mg total) by mouth at  bedtime as needed for itching. 30 capsule 1  . Incontinence Supply Disposable (DEPEND UNDERWEAR SM/MED) MISC Use as directed four times per day 120 each 5  . losartan-hydrochlorothiazide (HYZAAR) 100-25 MG tablet TAKE 1 TABLET BY MOUTH EVERY DAY 90 tablet 2  . meclizine (ANTIVERT) 12.5 MG tablet TAKE 1 TABLET BY MOUTH THREE TIMES A DAY AS NEEDED FOR DIZZINESS 270 tablet 0  . methocarbamol (ROBAXIN) 500 MG tablet Take 1 tablet (500 mg total) by mouth every 8 (eight) hours as needed for muscle spasms. 10 tablet 0  . montelukast (SINGULAIR) 10 MG tablet TAKE 1 TABLET BY MOUTH EVERY DAY 90 tablet 3  . Multiple Vitamins-Minerals (CENTRUM SILVER PO) Take 1 tablet by mouth daily.      . pantoprazole (PROTONIX) 40 MG tablet TAKE 1 TABLET BY MOUTH EVERY DAY 90 tablet 2  . potassium chloride (KLOR-CON) 10 MEQ tablet Tab by mouth daily when taking lasix 90 tablet 3  . Potassium Gluconate 550 (90 K) MG TABS Take 550 mg by mouth daily.     . predniSONE (DELTASONE) 10 MG tablet 3 tabs by mouth per day for 3 days,2tabs per day for 3 days,1tab per day for 3 days 18 tablet 0  . Simethicone (GAS-X PO) Take 1 tablet by mouth daily as needed (for gas).     . solifenacin (VESICARE) 5 MG tablet Take 1 tablet (5 mg total) by mouth daily. 90 tablet 3  . tizanidine (ZANAFLEX) 2 MG capsule TAKE 1 CAPSULE BY MOUTH 3 TIMES A DAY AS NEEDED FOR MUSCLE SPASMS 40 capsule 1  . triamcinolone (NASACORT AQ) 55 MCG/ACT AERO nasal inhaler Place 2 sprays into the nose daily. 1 Inhaler 12  . triamcinolone cream (KENALOG) 0.1 % APPLY TO AFFECTED AREA TWICE A DAY 30 g 0  . Vitamin D, Ergocalciferol, (DRISDOL) 1.25 MG (50000 UT) CAPS capsule Take 1 capsule (50,000 Units total) by mouth every 7 (seven) days. 12 capsule 0   No current facility-administered medications on file prior to visit.   Review of Systems All otherwise neg per pt    Objective:   Physical Exam BP 100/68 (BP Location: Left Arm, Patient Position: Sitting, Cuff Size:  Large)   Pulse 65   Temp 98.2 F (36.8 C) (Oral)   Ht '5\' 3"'$  (1.6 m)   Wt 158 lb (71.7 kg)   SpO2 96%   BMI 27.99 kg/m  VS noted,  Constitutional: Pt appears in NAD HENT: Head: NCAT.  Right Ear: External ear normal.  Left Ear: External ear normal.  Eyes: . Pupils are equal, round, and reactive to light. Conjunctivae and EOM are normal Nose: without d/c or deformity Neck: Neck supple. Gross normal ROM Cardiovascular: Normal rate and regular rhythm.  Pulmonary/Chest: Effort normal and breath sounds without rales or wheezing., has fatty fullness over the left supraclavicular area without other firm mass or pain  Abd:  Soft, NT, ND, + BS, no organomegaly Joints: no overt effusions Neurological: Pt is alert. At baseline orientation, motor grossly intact Skin: Skin is warm. No rashes, other new lesions, no LE edema Psychiatric: Pt behavior is normal without agitation  All otherwise neg per pt Lab Results  Component Value Date   WBC 14.4 (H) 12/07/2019   HGB 9.9 (L) 12/07/2019   HCT 29.9 (L) 12/07/2019   PLT 254.0 12/07/2019   GLUCOSE 99 12/07/2019   CHOL 183 12/07/2019   TRIG 78.0 12/07/2019   HDL 77.50 12/07/2019   LDLCALC 90 12/07/2019   ALT 21 12/07/2019   AST 21 12/07/2019   NA 135 12/07/2019   K 3.7 12/07/2019   CL 95 (L) 12/07/2019   CREATININE 1.73 (H) 12/07/2019   BUN 44 (H) 12/07/2019   CO2 31 12/07/2019   TSH 2.21 12/07/2019   INR 1.35 01/17/2018   HGBA1C 5.8 12/07/2019       Assessment & Plan:

## 2020-04-23 NOTE — Assessment & Plan Note (Addendum)
Exam c/w probable lipoma, for cxr, consider ct chest

## 2020-04-23 NOTE — Assessment & Plan Note (Signed)
Overall stable, cont prednsone, f/u rheum as planned

## 2020-04-23 NOTE — Assessment & Plan Note (Signed)
stable overall by history and exam, recent data reviewed with pt, and pt to continue medical treatment as before,  to f/u any worsening symptoms or concerns  

## 2020-04-23 NOTE — Assessment & Plan Note (Addendum)
Chronic persistent, for tramadol Er 200 qd,  to f/u any worsening symptoms or concerns, for pain management referral  I spent 41 minutes in preparing to see the patient by review of recent labs, imaging and procedures, obtaining and reviewing separately obtained history, communicating with the patient and family or caregiver, ordering medications, tests or procedures, and documenting clinical information in the EHR including the differential Dx, treatment, and any further evaluation and other management of chronic pain, RA, supraclavicalar fossa fullness, htn, hyperglycemia

## 2020-04-23 NOTE — Telephone Encounter (Signed)
Tried calling pt to discuss her conerns and to find out what exactly it is she wanted to discuss. Pt did not answer to LVM for pt to call the clinic when she can.

## 2020-04-23 NOTE — Patient Instructions (Addendum)
Please take all new medication as prescribed - the tramadol ER 200 mg per day  You will be contacted regarding the referral for: Pain management  If you think you have some pain that may be more specific such as one shoulder or one knee pain, please consider seeing Sports Medicine on the first floor  Please continue all other medications as before, and refills have been done if requested.  Please have the pharmacy call with any other refills you may need.  Please continue your efforts at being more active, low cholesterol diet, and weight control.  Please keep your appointments with your specialists as you may have planned - Rheumatology in October  Please go to the XRAY Department in the first floor for the x-ray testing - on Friday at the [facility] will be contacted by phone if any changes need to be made immediately.  Otherwise, you will receive a letter about your results with an explanation, but please check with MyChart first.  Please remember to sign up for MyChart if you have not done so, as this will be important to you in the future with finding out test results, communicating by private email, and scheduling acute appointments online when needed.  Please make an Appointment to return in 6 months, or sooner if needed

## 2020-04-28 ENCOUNTER — Other Ambulatory Visit: Payer: Self-pay | Admitting: Internal Medicine

## 2020-05-01 ENCOUNTER — Telehealth: Payer: Self-pay

## 2020-05-01 NOTE — Telephone Encounter (Signed)
Key: [id number]

## 2020-05-02 NOTE — Telephone Encounter (Signed)
Can you let pt know that prior auth for the tramadol was approved and to contact her pharmacy and let them know when to have the medication filled.

## 2020-05-05 ENCOUNTER — Other Ambulatory Visit: Payer: Self-pay | Admitting: Internal Medicine

## 2020-05-06 ENCOUNTER — Encounter: Payer: Self-pay | Admitting: Physical Medicine and Rehabilitation

## 2020-05-06 ENCOUNTER — Telehealth: Payer: Self-pay | Admitting: Internal Medicine

## 2020-05-06 NOTE — Telephone Encounter (Signed)
    Patient wants to know if Dr Jenny Reichmann can get her a sooner appointment with Izora Ribas, MD

## 2020-05-07 NOTE — Telephone Encounter (Signed)
Very sorry, I just dont have much influence over the scheduling, so I dont think I can help with this

## 2020-05-07 NOTE — Telephone Encounter (Signed)
Sent to Dr. John. 

## 2020-05-08 NOTE — Telephone Encounter (Signed)
Called pt, LVM.   

## 2020-05-13 ENCOUNTER — Other Ambulatory Visit: Payer: Self-pay | Admitting: Internal Medicine

## 2020-05-13 NOTE — Telephone Encounter (Signed)
Done erx 

## 2020-05-19 ENCOUNTER — Other Ambulatory Visit: Payer: Self-pay | Admitting: Internal Medicine

## 2020-05-19 NOTE — Telephone Encounter (Signed)
Please refill as per office routine med refill policy (all routine meds refilled for 3 mo or monthly per pt preference up to one year from last visit, then month to month grace period for 3 mo, then further med refills will have to be denied)  

## 2020-05-23 DIAGNOSIS — M79643 Pain in unspecified hand: Secondary | ICD-10-CM | POA: Diagnosis not present

## 2020-05-23 DIAGNOSIS — M1990 Unspecified osteoarthritis, unspecified site: Secondary | ICD-10-CM | POA: Diagnosis not present

## 2020-05-23 DIAGNOSIS — D649 Anemia, unspecified: Secondary | ICD-10-CM | POA: Diagnosis not present

## 2020-05-23 DIAGNOSIS — Z79899 Other long term (current) drug therapy: Secondary | ICD-10-CM | POA: Diagnosis not present

## 2020-05-23 DIAGNOSIS — M1180 Other specified crystal arthropathies, unspecified site: Secondary | ICD-10-CM | POA: Diagnosis not present

## 2020-05-23 DIAGNOSIS — M7500 Adhesive capsulitis of unspecified shoulder: Secondary | ICD-10-CM | POA: Diagnosis not present

## 2020-05-23 DIAGNOSIS — R700 Elevated erythrocyte sedimentation rate: Secondary | ICD-10-CM | POA: Diagnosis not present

## 2020-05-23 DIAGNOSIS — M353 Polymyalgia rheumatica: Secondary | ICD-10-CM | POA: Diagnosis not present

## 2020-05-23 DIAGNOSIS — M25519 Pain in unspecified shoulder: Secondary | ICD-10-CM | POA: Diagnosis not present

## 2020-05-23 DIAGNOSIS — Z13820 Encounter for screening for osteoporosis: Secondary | ICD-10-CM | POA: Diagnosis not present

## 2020-06-05 ENCOUNTER — Ambulatory Visit: Payer: Medicare HMO | Admitting: Internal Medicine

## 2020-06-09 ENCOUNTER — Other Ambulatory Visit: Payer: Self-pay | Admitting: Internal Medicine

## 2020-06-09 NOTE — Telephone Encounter (Signed)
Please refill as per office routine med refill policy (all routine meds refilled for 3 mo or monthly per pt preference up to one year from last visit, then month to month grace period for 3 mo, then further med refills will have to be denied)  

## 2020-06-13 ENCOUNTER — Encounter: Payer: Medicare HMO | Attending: Physical Medicine and Rehabilitation | Admitting: Physical Medicine and Rehabilitation

## 2020-06-13 ENCOUNTER — Other Ambulatory Visit: Payer: Self-pay

## 2020-06-13 ENCOUNTER — Encounter: Payer: Self-pay | Admitting: Physical Medicine and Rehabilitation

## 2020-06-13 VITALS — BP 130/76 | HR 73 | Temp 98.7°F | Ht 63.0 in | Wt 151.0 lb

## 2020-06-13 DIAGNOSIS — Z9889 Other specified postprocedural states: Secondary | ICD-10-CM

## 2020-06-13 DIAGNOSIS — M5432 Sciatica, left side: Secondary | ICD-10-CM

## 2020-06-13 DIAGNOSIS — M5136 Other intervertebral disc degeneration, lumbar region: Secondary | ICD-10-CM | POA: Diagnosis not present

## 2020-06-13 DIAGNOSIS — M79605 Pain in left leg: Secondary | ICD-10-CM

## 2020-06-13 NOTE — Progress Notes (Signed)
Subjective:    Patient ID: [name], female    DOB: [birth year], 84 y.o.   MRN: [id number]  HPI  [name] is an 84 year old woman who presents to establish care for bilateral adhesive capsulitis.   Chart reviewed prior to encounter.  She followed with Dr. Cathlean Cower for chronic pain syndrome on 6/29. PDMP reviewed. She last received Tramadol from Dr. Cathlean Cower on 6/29. She received $RemoveBefo'200mg'LmedZmOqXvO$  Tramadol ER 90 tablets for 90 days (MME 20).   Her chief complaint is bilateral shoulder pain. She has never  She has never tried Meloxicam. He has never had corticosteroid injections. She does not fin the Tramadol and it did not help much. Her creatinine is 1.73 in February. No history of DM.   She raised her two children as a single mother. Her daughter who accompanies her is Environmental consultant principal of a school. She takes great care of her mother.    Pain Inventory Average Pain 4 Pain Right Now 0 My pain is aching  In the last 24 hours, has pain interfered with the following? General activity 5 Relation with others 0 Enjoyment of life 2 What TIME of day is your pain at its worst? morning  Sleep (in general) Good  Pain is worse with: some activites Pain improves with: heat/ice and injections Relief from Meds: 5  use a walker ability to climb steps?  yes needs help with transfers  retired I need assistance with the following:  shopping  bowel control problems trouble walking dizziness  new  new    Family History  Problem Relation Age of Onset  . Colon cancer Mother        in her 73's  . Stomach cancer Mother   . Heart disease Other        remote family  . Lung cancer Brother        was a smoker  . Heart attack Son 61   Social History   Socioeconomic History  . Marital status: Widowed    Spouse name: Not on file  . Number of children: 2  . Years of education: Not on file  . Highest education level: Not on file  Occupational History  . Occupation: owns  Teacher, adult education and works PT for [employer]  Tobacco Use  . Smoking status: Never Smoker  . Smokeless tobacco: Never Used  Vaping Use  . Vaping Use: Never used  Substance and Sexual Activity  . Alcohol use: No    Alcohol/week: 0.0 standard drinks  . Drug use: No  . Sexual activity: Not Currently  Other Topics Concern  . Not on file  Social History Narrative  . Not on file   Social Determinants of Health   Financial Resource Strain:   . Difficulty of Paying Living Expenses: Not on file  Food Insecurity:   . Worried About Charity fundraiser in the Last Year: Not on file  . Ran Out of Food in the Last Year: Not on file  Transportation Needs:   . Lack of Transportation (Medical): Not on file  . Lack of Transportation (Non-Medical): Not on file  Physical Activity:   . Days of Exercise per Week: Not on file  . Minutes of Exercise per Session: Not on file  Stress:   . Feeling of Stress : Not on file  Social Connections:   . Frequency of Communication with Friends and Family: Not on file  . Frequency of Social Gatherings with Friends  and Family: Not on file  . Attends Religious Services: Not on file  . Active Member of Clubs or Organizations: Not on file  . Attends Archivist Meetings: Not on file  . Marital Status: Not on file   Past Surgical History:  Procedure Laterality Date  . ABDOMINAL HYSTERECTOMY    . BREAST LUMPECTOMY WITH NEEDLE LOCALIZATION AND AXILLARY SENTINEL LYMPH NODE BX Left 12/04/2013   Procedure: BREAST LUMPECTOMY WITH NEEDLE LOCALIZATION AND AXILLARY SENTINEL LYMPH NODE BX;  Surgeon: Shann Medal, MD;  Location: [place];  Service: General;  Laterality: Left;  . CATARACT EXTRACTION  2009   rt  . COLONOSCOPY    . EYE SURGERY Bilateral    cataract surgery  . KNEE ARTHROSCOPY     both  . LUMBAR LAMINECTOMY/DECOMPRESSION MICRODISCECTOMY Left 12/23/2017   Procedure: Left Lumbar One-Two Laminectomy with microdiscectomy;  Surgeon: Eustace Moore, MD;  Location: [place];  Service: Neurosurgery;  Laterality: Left;  Left L1-2 Laminectomy with microdiscectomy  . TONSILLECTOMY    . TOTAL KNEE ARTHROPLASTY  2002   rt  . TOTAL KNEE ARTHROPLASTY  2003   left  . VESICOVAGINAL FISTULA CLOSURE W/ TAH  1980   Past Medical History:  Diagnosis Date  . Allergic rhinitis 10/30/2016  . Anemia   . Asthma   . Breast cancer of upper-outer quadrant of left female breast ([place]) 11/08/2013   ER/PR+ Her2- Left IDC   . Chronic renal insufficiency   . Chronic rhinitis   . Colon polyp   . Diastolic dysfunction 0/27/7412  . DJD (degenerative joint disease)   . Dyspnea   . Full dentures   . GERD (gastroesophageal reflux disease)   . Hearing loss   . Hypertension   . Hyponatremia   . Impaired glucose tolerance 07/18/2014  . Memory loss   . Morbid obesity ([place])   . Poor circulation   . Vertigo   . Wears glasses    BP 130/76   Pulse 73   Temp 98.7 F (37.1 C)   Ht $R'5\' 3"'SD$  (1.6 m)   Wt 151 lb (68.5 kg)   SpO2 95%   BMI 26.75 kg/m   Opioid Risk Score:   Fall Risk Score:  `1  Depression screen PHQ 2/9  Depression screen [facility] 2/9 06/13/2020 12/07/2019 04/13/2019 11/30/2018 08/30/2018 01/13/2018 11/22/2017  Decreased Interest 0 0 0 0 0 0 0  Down, Depressed, Hopeless 0 0 0 0 0 0 0  PHQ - 2 Score 0 0 0 0 0 0 0  Altered sleeping 0 - - - - - -  Tired, decreased energy 2 - - - - - -  Change in appetite 0 - - - - - -  Feeling bad or failure about yourself  0 - - - - - -  Trouble concentrating 0 - - - - - -  Moving slowly or fidgety/restless 3 - - - - - -  Suicidal thoughts 0 - - - - - -  PHQ-9 Score 5 - - - - - -  Difficult doing work/chores Somewhat difficult - - - - - -  Some recent data might be hidden    Review of Systems  Constitutional: Negative.   HENT: Negative.   Eyes: Negative.   Respiratory: Positive for cough and shortness of breath.   Cardiovascular: Negative.   Gastrointestinal: Negative.   Endocrine: Negative.    Genitourinary: Negative.   Musculoskeletal: Positive for arthralgias and gait problem.  Skin: Negative.   Allergic/Immunologic: Negative.   Hematological: Bruises/bleeds easily.  All other systems reviewed and are negative.      Objective:   Physical Exam Gen: no distress, normal appearing HEENT: oral mucosa pink and moist, NCAT Cardio: Reg rate Chest: normal effort, normal rate of breathing Abd: soft, non-distended Ext: no edema Skin: intact Neuro: Alert and Musculoskeletal: Severely decreased range of motions in all planes in bilateral shoulders. TTP bilateral AC joints.  Psych: pleasant, normal affect    Assessment & Plan:  Mrs. Heard is an 84 year old woman who presents to establish care for bilateral adhesive capsulitis. She has a history of  left sided lower back and radicular pain s/p lumbar laminectomy. Chart reviewed prior to encounter.  PDMP reviewed. She last received Tramadol from Dr. Cathlean Cower on 6/29. She received $RemoveBefo'200mg'DxKfhsFYqKg$  Tramadol ER 90 tablets for 90 days (MME 20).   -She is not getting good benefit from the Tramadol and has AKI. Cr reviewed- was 1.73 in February. Advised that she can stop this medication if not helping.   -Continue HEP.   Shoulder injection, bilateral, performed today for adhesive capsulitis.   Indication:Bilateral shoulder pain not relieved by medication management and other conservative care.  Informed consent was obtained after describing risks and benefits of the procedure with the patient, this includes bleeding, bruising, infection and medication side effects. The patient wishes to proceed and has given written consent. Patient was placed in a seated position. Bilateral shoulders was marked and prepped with betadine in the subacromial area. A 25-gauge 1-1/2 inch needle was inserted into the subacromial area. After negative draw back for blood, a solution containing 1 mL of 6 mg per ML celestone and 4 mL of 1% lidocaine was injected. A band aid  was applied. The patient tolerated the procedure well. Post procedure instructions were given.  -Ice after injection 15 minutes three times per day.   -Discussed compounding cream she can consider if she does not get complete relief from injection.   All questions answered. An initial evaluation of her adhesive capsulitis took place today. Treatment options were considered and labs reviewed to discuss her risk profile. Patient and her daughter were agreeable to try bilateral corticosteroid injections as performed above (modifiers 25 and 50 were used accordingly). RTC in 1 month.

## 2020-07-02 ENCOUNTER — Telehealth: Payer: Self-pay | Admitting: Internal Medicine

## 2020-07-02 ENCOUNTER — Telehealth: Payer: Self-pay | Admitting: *Deleted

## 2020-07-02 NOTE — Telephone Encounter (Signed)
Quick release inhaler received from [employer]  Need to speak with the Provider

## 2020-07-02 NOTE — Telephone Encounter (Signed)
[name] called and reports she is still having pain in her shoulders.  Injections helped a little but still in pain and she needs help.

## 2020-07-02 NOTE — Telephone Encounter (Signed)
If there is an earlier appointment, can you please call this patient to see if she would like to be seen earlier?

## 2020-07-04 NOTE — Telephone Encounter (Signed)
LVM for pt to please call the office back about her inhaler she has received as there is no information on the reason or concerns so I was not able to address her concerns via vm.

## 2020-07-08 NOTE — Telephone Encounter (Signed)
Correction: patient does not have inhaler, has a letter from [employer], Recommending the quick release inhaler

## 2020-07-08 NOTE — Telephone Encounter (Signed)
Sent to Dr. John. 

## 2020-07-08 NOTE — Telephone Encounter (Signed)
Since this patient literally has 3 inhalers on her med list,  It would be stupid to try to guess which one is meant by this message  Please clarify

## 2020-07-09 NOTE — Telephone Encounter (Signed)
Tried to call pt. Pt did not answer her phone. Phone just rang out. Dr. Jenny Reichmann I needing to know which inhaler it is that the pt needs.

## 2020-07-12 ENCOUNTER — Telehealth: Payer: Self-pay | Admitting: Internal Medicine

## 2020-07-12 NOTE — Telephone Encounter (Signed)
   Patient seeking advice for extreme dry mouth/tongue.

## 2020-07-15 NOTE — Telephone Encounter (Signed)
She can make an appointment with her dentist or she can see Dr. Jenny Reichmann once he is back in the office next week.

## 2020-07-15 NOTE — Telephone Encounter (Signed)
Sent to Jodi Mourning to advise.

## 2020-07-16 ENCOUNTER — Other Ambulatory Visit: Payer: Self-pay

## 2020-07-16 NOTE — Telephone Encounter (Signed)
Called pt lvm of [facility] advice.

## 2020-07-17 ENCOUNTER — Encounter: Payer: Medicare HMO | Attending: Physical Medicine and Rehabilitation | Admitting: Physical Medicine and Rehabilitation

## 2020-07-17 ENCOUNTER — Encounter: Payer: Self-pay | Admitting: Physical Medicine and Rehabilitation

## 2020-07-17 ENCOUNTER — Other Ambulatory Visit: Payer: Self-pay

## 2020-07-17 VITALS — BP 142/79 | HR 91 | Temp 98.6°F | Ht 63.0 in | Wt 146.6 lb

## 2020-07-17 DIAGNOSIS — M5432 Sciatica, left side: Secondary | ICD-10-CM | POA: Diagnosis not present

## 2020-07-17 DIAGNOSIS — M5136 Other intervertebral disc degeneration, lumbar region: Secondary | ICD-10-CM | POA: Insufficient documentation

## 2020-07-17 DIAGNOSIS — Z9889 Other specified postprocedural states: Secondary | ICD-10-CM | POA: Diagnosis not present

## 2020-07-17 DIAGNOSIS — M7501 Adhesive capsulitis of right shoulder: Secondary | ICD-10-CM

## 2020-07-17 DIAGNOSIS — M79605 Pain in left leg: Secondary | ICD-10-CM | POA: Diagnosis not present

## 2020-07-17 DIAGNOSIS — M7502 Adhesive capsulitis of left shoulder: Secondary | ICD-10-CM | POA: Diagnosis not present

## 2020-07-17 MED ORDER — DICLOFENAC SODIUM 1 % EX GEL: 2.0000 g | Freq: Four times a day (QID) | CUTANEOUS | 3 refills | Status: DC

## 2020-07-17 MED ORDER — MAGNESIUM GLUCONATE 30 MG PO TABS: 30.0000 mg | ORAL_TABLET | Freq: Two times a day (BID) | ORAL | 3 refills | Status: DC

## 2020-07-17 MED ORDER — LIDOCAINE 4 % EX PTCH: 1.0000 | MEDICATED_PATCH | Freq: Every day | CUTANEOUS | 3 refills | Status: DC

## 2020-07-17 NOTE — Patient Instructions (Signed)
Tylenol 650mg  morning, lunch, dinner Apply diclofenac gel up to 4 times per day (alternatively can try CBD oil or blue emu oil)  Lidocaine patch on both shoulders.   Foods that decrease pain:  1) Ginger 2) Blueberries 3) Salmon 4) Pumpkin seeds 5) dark chocolate 6) turmeric 7) tart cherries 8) virgin olive oil 9) chilli peppers 10) mint 11) red wine

## 2020-07-17 NOTE — Progress Notes (Signed)
Subjective:    Patient ID: [name], female    DOB: [birth year], 84 y.o.   MRN: [id number]  HPI   [name] is an 84 year old woman who presents for follow-up of pain in her bilateral shoulders.   She had no benefit from the corticosteroid shots. She felt sick afterward. Her daughter has to bring her to her appointments so she was not able to come in earlier. She is taking oral prednisone- she started yesterday. Has not yet been able to feel any benefit.   She prefers less medications. She is willing to try gel and patches and anti-inflammatory foods.   She is having difficulty raising her hands and thus performing ADLs due to her pain. She does have excellent family support for which she is very thankful.     Pain Inventory Average Pain 10 Pain Right Now 7 My pain is intermittent, sharp and stabbing  In the last 24 hours, has pain interfered with the following? General activity 10 Relation with others 6 Enjoyment of life 10 What TIME of day is your pain at its worst? morning  Sleep (in general) Good  Pain is worse with: walking, bending and standing Pain improves with: heat/ice and medication Relief from Meds: 2  Family History  Problem Relation Age of Onset  . Colon cancer Mother        in her 65's  . Stomach cancer Mother   . Heart disease Other        remote family  . Lung cancer Brother        was a smoker  . Heart attack Son 82   Social History   Socioeconomic History  . Marital status: Widowed    Spouse name: Not on file  . Number of children: 2  . Years of education: Not on file  . Highest education level: Not on file  Occupational History  . Occupation: owns Teacher, adult education and works PT for [employer]  Tobacco Use  . Smoking status: Never Smoker  . Smokeless tobacco: Never Used  Vaping Use  . Vaping Use: Never used  Substance and Sexual Activity  . Alcohol use: No    Alcohol/week: 0.0 standard drinks  . Drug use: No  . Sexual activity:  Not Currently  Other Topics Concern  . Not on file  Social History Narrative  . Not on file   Social Determinants of Health   Financial Resource Strain:   . Difficulty of Paying Living Expenses: Not on file  Food Insecurity:   . Worried About Charity fundraiser in the Last Year: Not on file  . Ran Out of Food in the Last Year: Not on file  Transportation Needs:   . Lack of Transportation (Medical): Not on file  . Lack of Transportation (Non-Medical): Not on file  Physical Activity:   . Days of Exercise per Week: Not on file  . Minutes of Exercise per Session: Not on file  Stress:   . Feeling of Stress : Not on file  Social Connections:   . Frequency of Communication with Friends and Family: Not on file  . Frequency of Social Gatherings with Friends and Family: Not on file  . Attends Religious Services: Not on file  . Active Member of Clubs or Organizations: Not on file  . Attends Archivist Meetings: Not on file  . Marital Status: Not on file   Past Surgical History:  Procedure Laterality Date  . ABDOMINAL  HYSTERECTOMY    . BREAST LUMPECTOMY WITH NEEDLE LOCALIZATION AND AXILLARY SENTINEL LYMPH NODE BX Left 12/04/2013   Procedure: BREAST LUMPECTOMY WITH NEEDLE LOCALIZATION AND AXILLARY SENTINEL LYMPH NODE BX;  Surgeon: Shann Medal, MD;  Location: [place];  Service: General;  Laterality: Left;  . CATARACT EXTRACTION  2009   rt  . COLONOSCOPY    . EYE SURGERY Bilateral    cataract surgery  . KNEE ARTHROSCOPY     both  . LUMBAR LAMINECTOMY/DECOMPRESSION MICRODISCECTOMY Left 12/23/2017   Procedure: Left Lumbar One-Two Laminectomy with microdiscectomy;  Surgeon: Eustace Moore, MD;  Location: [place];  Service: Neurosurgery;  Laterality: Left;  Left L1-2 Laminectomy with microdiscectomy  . TONSILLECTOMY    . TOTAL KNEE ARTHROPLASTY  2002   rt  . TOTAL KNEE ARTHROPLASTY  2003   left  . VESICOVAGINAL FISTULA CLOSURE W/ TAH  1980   Past Surgical  History:  Procedure Laterality Date  . ABDOMINAL HYSTERECTOMY    . BREAST LUMPECTOMY WITH NEEDLE LOCALIZATION AND AXILLARY SENTINEL LYMPH NODE BX Left 12/04/2013   Procedure: BREAST LUMPECTOMY WITH NEEDLE LOCALIZATION AND AXILLARY SENTINEL LYMPH NODE BX;  Surgeon: Shann Medal, MD;  Location: [place];  Service: General;  Laterality: Left;  . CATARACT EXTRACTION  2009   rt  . COLONOSCOPY    . EYE SURGERY Bilateral    cataract surgery  . KNEE ARTHROSCOPY     both  . LUMBAR LAMINECTOMY/DECOMPRESSION MICRODISCECTOMY Left 12/23/2017   Procedure: Left Lumbar One-Two Laminectomy with microdiscectomy;  Surgeon: Eustace Moore, MD;  Location: [place];  Service: Neurosurgery;  Laterality: Left;  Left L1-2 Laminectomy with microdiscectomy  . TONSILLECTOMY    . TOTAL KNEE ARTHROPLASTY  2002   rt  . TOTAL KNEE ARTHROPLASTY  2003   left  . VESICOVAGINAL FISTULA CLOSURE W/ TAH  1980   Past Medical History:  Diagnosis Date  . Allergic rhinitis 10/30/2016  . Anemia   . Asthma   . Breast cancer of upper-outer quadrant of left female breast ([place]) 11/08/2013   ER/PR+ Her2- Left IDC   . Chronic renal insufficiency   . Chronic rhinitis   . Colon polyp   . Diastolic dysfunction 05/28/2121  . DJD (degenerative joint disease)   . Dyspnea   . Full dentures   . GERD (gastroesophageal reflux disease)   . Hearing loss   . Hypertension   . Hyponatremia   . Impaired glucose tolerance 07/18/2014  . Memory loss   . Morbid obesity ([place])   . Poor circulation   . Vertigo   . Wears glasses    Temp 98.6 F (37 C)   Ht _0  (1.6 m)   Wt 146 lb 9.6 oz (66.5 kg)   BMI 25.97 kg/m   Opioid Risk Score:   Fall Risk Score:  `1  Depression screen PHQ 2/9  Depression screen [facility] 2/9 06/13/2020 12/07/2019 04/13/2019 11/30/2018 08/30/2018 01/13/2018 11/22/2017  Decreased Interest 0 0 0 0 0 0 0  Down, Depressed, Hopeless 0 0 0 0 0 0 0  PHQ - 2 Score 0 0 0 0 0 0 0  Altered sleeping 0 - - - - - -  Tired,  decreased energy 2 - - - - - -  Change in appetite 0 - - - - - -  Feeling bad or failure about yourself  0 - - - - - -  Trouble concentrating 0 - - - - - -  Moving slowly or fidgety/restless 3 - - - - - -  Suicidal thoughts 0 - - - - - -  PHQ-9 Score 5 - - - - - -  Difficult doing work/chores Somewhat difficult - - - - - -  Some recent data might be hidden   Review of Systems  Constitutional: Negative.   HENT: Negative.   Eyes: Negative.   Respiratory: Negative.   Cardiovascular: Negative.   Gastrointestinal: Negative.   Endocrine: Negative.   Genitourinary: Negative.   Musculoskeletal: Positive for arthralgias, back pain and gait problem.  Skin: Negative.   Allergic/Immunologic: Negative.   Hematological: Negative.   Psychiatric/Behavioral: Negative.        Objective:   Physical Exam Gen: no distress, normal appearing HEENT: oral mucosa pink and moist, NCAT Cardio: Reg rate Chest: normal effort, normal rate of breathing Abd: soft, non-distended Ext: no edema Skin: intact Neuro:Alert and oriented x3 Musculoskeletal: Severely limited range of motion in bilateral arms- only about 30 degrees in bilateral elevation.  Psych: pleasant, normal affect    Assessment & Plan:   1) Chronic bilateral adhesive capsulitis: Tylenol 630m morning, lunch, dinner Apply diclofenac gel up to 4 times per day (alternatively can try CBD oil or blue emu oil)  Lidocaine patch on both shoulders.  Vitamin D level checked and was normal in 2020  Foods that decrease pain:  1) Ginger 2) Blueberries 3) Salmon 4) Pumpkin seeds 5) dark chocolate 6) turmeric 7) tart cherries 8) virgin olive oil 9) chilli peppers 10) mint 11) red wine  2) Hypomagnesemia  -Magnesium level checked from 2019 and was low at 1.3: will order supplement to take at night  3) Family support: Has wonderful family support. Discussed how touched she is by her children's care.   4) Incontinent stool/constipation:  Magnesium will also help to regulate this. Discussed goal of 1-2 BM per day.

## 2020-07-31 ENCOUNTER — Other Ambulatory Visit: Payer: Self-pay

## 2020-07-31 ENCOUNTER — Ambulatory Visit (INDEPENDENT_AMBULATORY_CARE_PROVIDER_SITE_OTHER): Payer: Medicare HMO | Admitting: Internal Medicine

## 2020-07-31 ENCOUNTER — Encounter: Payer: Self-pay | Admitting: Internal Medicine

## 2020-07-31 VITALS — BP 126/66 | HR 83 | Temp 97.9°F | Ht 63.0 in | Wt 145.0 lb

## 2020-07-31 DIAGNOSIS — M25512 Pain in left shoulder: Secondary | ICD-10-CM

## 2020-07-31 DIAGNOSIS — N183 Chronic kidney disease, stage 3 unspecified: Secondary | ICD-10-CM

## 2020-07-31 DIAGNOSIS — R7302 Impaired glucose tolerance (oral): Secondary | ICD-10-CM | POA: Diagnosis not present

## 2020-07-31 DIAGNOSIS — D649 Anemia, unspecified: Secondary | ICD-10-CM | POA: Diagnosis not present

## 2020-07-31 DIAGNOSIS — M25511 Pain in right shoulder: Secondary | ICD-10-CM

## 2020-07-31 DIAGNOSIS — M25519 Pain in unspecified shoulder: Secondary | ICD-10-CM | POA: Insufficient documentation

## 2020-07-31 DIAGNOSIS — G8929 Other chronic pain: Secondary | ICD-10-CM | POA: Diagnosis not present

## 2020-07-31 DIAGNOSIS — Z23 Encounter for immunization: Secondary | ICD-10-CM | POA: Diagnosis not present

## 2020-07-31 DIAGNOSIS — I10 Essential (primary) hypertension: Secondary | ICD-10-CM | POA: Diagnosis not present

## 2020-07-31 DIAGNOSIS — E611 Iron deficiency: Secondary | ICD-10-CM | POA: Diagnosis not present

## 2020-07-31 DIAGNOSIS — R350 Frequency of micturition: Secondary | ICD-10-CM | POA: Diagnosis not present

## 2020-07-31 NOTE — Patient Instructions (Signed)
You had the flu shot today  Please continue all other medications as before, and refills have been done if requested.  Please have the pharmacy call with any other refills you may need.  Please continue your efforts at being more active, low cholesterol diet, and weight control.  You are otherwise up to date with prevention measures today.  Please keep your appointments with your specialists as you may have planned  You will be contacted regarding the referral for: Dr Veverly Fells for the shoulders  Please go to the LAB at the blood drawing area for the tests to be done - at your convenience at the [place] will be contacted by phone if any changes need to be made immediately.  Otherwise, you will receive a letter about your results with an explanation, but please check with MyChart first.  Please remember to sign up for MyChart if you have not done so, as this will be important to you in the future with finding out test results, communicating by private email, and scheduling acute appointments online when needed.  Please make an Appointment to return in 6 months, or sooner if needed

## 2020-07-31 NOTE — Progress Notes (Addendum)
Subjective:    Patient ID: [name], female    DOB: 1936/10/11, 84 y.o.   MRN: [id number]  HPI  Here to f/u; overall doing ok,  Pt denies chest pain, increasing sob or doe, wheezing, orthopnea, PND, increased LE swelling, palpitations, dizziness or syncope.  Pt denies new neurological symptoms such as new headache, or facial or extremity weakness or numbness.  Pt denies polydipsia, polyuria, or low sugar episode.  Pt states overall good compliance with meds, mostly trying to follow appropriate diet, with wt stable Wt Readings from Last 3 Encounters:  07/31/20 145 lb (65.8 kg)  07/17/20 146 lb 9.6 oz (66.5 kg)  06/13/20 151 lb (68.5 kg)   BP Readings from Last 3 Encounters:  07/31/20 126/66  07/17/20 (!) 142/79  06/13/20 130/76  no overt bleeding. Does c/o ongoing fatigue, but denies signficant daytime hypersomnolence. aLso mentions mild urinary frequency in the past 2 wks but Denies urinary symptoms such as dysuria,, urgency, flank pain, hematuria or n/v, fever, chills.  ALso has bilateral chronic shoulder pain, has seen ortho but no better, asks for second opinion. Past Medical History:  Diagnosis Date  . Allergic rhinitis 10/30/2016  . Anemia   . Asthma   . Breast cancer of upper-outer quadrant of left female breast ([place]) 11/08/2013   ER/PR+ Her2- Left IDC   . Chronic renal insufficiency   . Chronic rhinitis   . Colon polyp   . Diastolic dysfunction 4/76/5465  . DJD (degenerative joint disease)   . Dyspnea   . Full dentures   . GERD (gastroesophageal reflux disease)   . Hearing loss   . Hypertension   . Hyponatremia   . Impaired glucose tolerance 07/18/2014  . Memory loss   . Morbid obesity ([place])   . Poor circulation   . Vertigo   . Wears glasses    Past Surgical History:  Procedure Laterality Date  . ABDOMINAL HYSTERECTOMY    . BREAST LUMPECTOMY WITH NEEDLE LOCALIZATION AND AXILLARY SENTINEL LYMPH NODE BX Left 12/04/2013   Procedure: BREAST LUMPECTOMY WITH NEEDLE  LOCALIZATION AND AXILLARY SENTINEL LYMPH NODE BX;  Surgeon: Shann Medal, MD;  Location: [facility];  Service: General;  Laterality: Left;  . CATARACT EXTRACTION  2009   rt  . COLONOSCOPY    . EYE SURGERY Bilateral    cataract surgery  . KNEE ARTHROSCOPY     both  . LUMBAR LAMINECTOMY/DECOMPRESSION MICRODISCECTOMY Left 12/23/2017   Procedure: Left Lumbar One-Two Laminectomy with microdiscectomy;  Surgeon: Eustace Moore, MD;  Location: [facility];  Service: Neurosurgery;  Laterality: Left;  Left L1-2 Laminectomy with microdiscectomy  . TONSILLECTOMY    . TOTAL KNEE ARTHROPLASTY  2002   rt  . TOTAL KNEE ARTHROPLASTY  2003   left  . VESICOVAGINAL FISTULA CLOSURE W/ TAH  1980    reports that she has never smoked. She has never used smokeless tobacco. She reports that she does not drink alcohol and does not use drugs. family history includes Colon cancer in her mother; Heart attack (age of onset: 56) in her son; Heart disease in an other family member; Lung cancer in her brother; Stomach cancer in her mother. Allergies  Allergen Reactions  . Hydrocodone Itching  . Tizanidine Other (See Comments)    Dizzy and fall  . Iron Hives and Other (See Comments)    [place], bad constipation   Current Outpatient Medications on File Prior to Visit  Medication Sig Dispense Refill  .  albuterol (PROVENTIL HFA;VENTOLIN HFA) 108 (90 Base) MCG/ACT inhaler Inhale 2 puffs into the lungs every 6 (six) hours as needed for wheezing or shortness of breath. 1 Inhaler 11  . amLODipine (NORVASC) 5 MG tablet TAKE 1 TABLET BY MOUTH EVERY DAY 90 tablet 3  . aspirin 81 MG tablet Take 81 mg by mouth daily.      . diclofenac Sodium (VOLTAREN) 1 % GEL Apply 2 g topically 4 (four) times daily. 100 g 3  . Ensure (ENSURE) Take 1 Can by mouth 3 (three) times daily between meals. 237 mL 12  . FERREX 150 150 MG capsule TAKE 1 CAPSULE BY MOUTH TWICE A DAY 60 capsule 1  . Fluticasone-Salmeterol (ADVAIR DISKUS) 250-50  MCG/DOSE AEPB Inhale 1 puff into the lungs 2 (two) times daily. 180 each 3  . furosemide (LASIX) 20 MG tablet TAKE 1 TABLET BY MOUTH EVERY DAY AS NEEDED 90 tablet 1  . gabapentin (NEURONTIN) 300 MG capsule TAKE 1 CAPSULE BY MOUTH THREE TIMES A DAY 90 capsule 5  . hydrOXYzine (VISTARIL) 50 MG capsule Take 1 capsule (50 mg total) by mouth at bedtime as needed for itching. 30 capsule 1  . Incontinence Supply Disposable (DEPEND UNDERWEAR SM/MED) MISC Use as directed four times per day 120 each 5  . losartan-hydrochlorothiazide (HYZAAR) 100-25 MG tablet TAKE 1 TABLET BY MOUTH EVERY DAY 90 tablet 2  . magnesium gluconate (MAGONATE) 30 MG tablet Take 1 tablet (30 mg total) by mouth 2 (two) times daily. 60 tablet 3  . methocarbamol (ROBAXIN) 500 MG tablet Take 1 tablet (500 mg total) by mouth every 8 (eight) hours as needed for muscle spasms. 10 tablet 0  . MOMETASONE-AMMONIUM LACTATE EX mometasone    . montelukast (SINGULAIR) 10 MG tablet TAKE 1 TABLET BY MOUTH EVERY DAY 90 tablet 3  . Multiple Vitamins-Minerals (CENTRUM SILVER PO) Take 1 tablet by mouth daily.      . pantoprazole (PROTONIX) 40 MG tablet TAKE 1 TABLET BY MOUTH EVERY DAY 90 tablet 2  . potassium chloride (KLOR-CON) 10 MEQ tablet TAKE 1 TABLET BY MOUTH EVERY DAY WHEN TAKING FUROSEMIDE 90 tablet 3  . Potassium Gluconate 550 (90 K) MG TABS Take 550 mg by mouth daily.     . predniSONE (DELTASONE) 1 MG tablet Take 4 mg by mouth daily.    . predniSONE (DELTASONE) 10 MG tablet 3 tabs by mouth per day for 3 days,2tabs per day for 3 days,1tab per day for 3 days 18 tablet 0  . Simethicone (GAS-X PO) Take 1 tablet by mouth daily as needed (for gas).     . solifenacin (VESICARE) 5 MG tablet Take 1 tablet (5 mg total) by mouth daily. 90 tablet 3  . tizanidine (ZANAFLEX) 2 MG capsule TAKE 1 CAPSULE BY MOUTH 3 TIMES A DAY AS NEEDED FOR MUSCLE SPASMS 40 capsule 1  . traMADol (ULTRAM-ER) 200 MG 24 hr tablet Take 1 tablet (200 mg total) by mouth daily. 90  tablet 1  . triamcinolone (NASACORT AQ) 55 MCG/ACT AERO nasal inhaler Place 2 sprays into the nose daily. 1 Inhaler 12  . Vitamin D, Ergocalciferol, (DRISDOL) 1.25 MG (50000 UT) CAPS capsule Take 1 capsule (50,000 Units total) by mouth every 7 (seven) days. 12 capsule 0  . erythromycin ophthalmic ointment Place 1 application into the left eye 4 (four) times daily. For 10 days (Patient not taking: Reported on 07/31/2020) 3.5 g 0  . gabapentin (NEURONTIN) 100 MG capsule gabapentin 100 mg capsule  TAKE 1 CAPSULE BY MOUTH THREE TIMES A DAY (Patient not taking: Reported on 07/31/2020)    . iron polysaccharides (FERREX 150) 150 MG capsule Ferrex 150 mg iron capsule  TAKE 1 CAPSULE BY MOUTH TWICE A DAY (Patient not taking: Reported on 07/31/2020)    . Lidocaine (HM LIDOCAINE PATCH) 4 % PTCH Apply 1 patch topically daily. (Patient not taking: Reported on 07/31/2020) 30 patch 3  . methocarbamol (ROBAXIN) 1000 MG/10ML injection methocarbamol (Patient not taking: Reported on 07/31/2020)    . potassium chloride (KLOR-CON) 10 MEQ tablet potassium chloride ER 10 mEq tablet,extended release  TAKE 1 TABLET BY MOUTH EVERY DAY WHEN TAKING FUROSEMIDE (Patient not taking: Reported on 07/31/2020)    . triamcinolone cream (KENALOG) 0.1 % APPLY TO AFFECTED AREA TWICE A DAY (Patient not taking: Reported on 07/31/2020) 30 g 0   No current facility-administered medications on file prior to visit.   Review of Systems All otherwise neg per pt    Objective:   Physical Exam BP 126/66 (BP Location: Right Arm, Patient Position: Sitting, Cuff Size: Normal)   Pulse 83   Temp 97.9 F (36.6 C) (Oral)   Ht _0  (1.6 m)   Wt 145 lb (65.8 kg)   SpO2 92%   BMI 25.69 kg/m  VS noted,  Constitutional: Pt appears in NAD HENT: Head: NCAT.  Right Ear: External ear normal.  Left Ear: External ear normal.  Eyes: . Pupils are equal, round, and reactive to light. Conjunctivae and EOM are normal Nose: without d/c or deformity Neck:  Neck supple. Gross normal ROM Cardiovascular: Normal rate and regular rhythm.   Pulmonary/Chest: Effort normal and breath sounds without rales or wheezing.  Abd:  Soft, NT, ND, + BS, no organomegaly Neurological: Pt is alert. At baseline orientation, motor grossly intact Skin: Skin is warm. No rashes, other new lesions, no LE edema Psychiatric: Pt behavior is normal without agitation  All otherwise neg per pt Lab Results  Component Value Date   WBC 10.7 (H) 08/01/2020   HGB 10.6 (L) 08/01/2020   HCT 31.6 (L) 08/01/2020   PLT 186.0 08/01/2020   GLUCOSE 146 (H) 08/01/2020   CHOL 201 (H) 08/01/2020   TRIG 214.0 (H) 08/01/2020   HDL 57.20 08/01/2020   LDLDIRECT 109.0 08/01/2020   LDLCALC 90 12/07/2019   ALT 27 08/01/2020   AST 23 08/01/2020   NA 134 (L) 08/01/2020   K 3.9 08/01/2020   CL 93 (L) 08/01/2020   CREATININE 1.74 (H) 08/01/2020   BUN 39 (H) 08/01/2020   CO2 32 08/01/2020   TSH 1.19 08/01/2020   INR 1.35 01/17/2018   HGBA1C 5.9 08/01/2020      Assessment & Plan:

## 2020-08-01 ENCOUNTER — Encounter: Payer: Self-pay | Admitting: Internal Medicine

## 2020-08-01 ENCOUNTER — Other Ambulatory Visit (INDEPENDENT_AMBULATORY_CARE_PROVIDER_SITE_OTHER): Payer: Medicare HMO

## 2020-08-01 DIAGNOSIS — E611 Iron deficiency: Secondary | ICD-10-CM | POA: Diagnosis not present

## 2020-08-01 DIAGNOSIS — I10 Essential (primary) hypertension: Secondary | ICD-10-CM | POA: Diagnosis not present

## 2020-08-01 DIAGNOSIS — R350 Frequency of micturition: Secondary | ICD-10-CM | POA: Diagnosis not present

## 2020-08-01 DIAGNOSIS — R7302 Impaired glucose tolerance (oral): Secondary | ICD-10-CM

## 2020-08-01 LAB — CBC WITH DIFFERENTIAL/PLATELET
Basophils Absolute: 0.1 10*3/uL (ref 0.0–0.1)
Basophils Relative: 0.8 % (ref 0.0–3.0)
Eosinophils Absolute: 0.1 10*3/uL (ref 0.0–0.7)
Eosinophils Relative: 0.6 % (ref 0.0–5.0)
HCT: 31.6 % — ABNORMAL LOW (ref 36.0–46.0)
Hemoglobin: 10.6 g/dL — ABNORMAL LOW (ref 12.0–15.0)
Lymphocytes Relative: 8.9 % — ABNORMAL LOW (ref 12.0–46.0)
Lymphs Abs: 1 10*3/uL (ref 0.7–4.0)
MCHC: 33.4 g/dL (ref 30.0–36.0)
MCV: 100.2 fl — ABNORMAL HIGH (ref 78.0–100.0)
Monocytes Absolute: 0.5 10*3/uL (ref 0.1–1.0)
Monocytes Relative: 4.8 % (ref 3.0–12.0)
Neutro Abs: 9.1 10*3/uL — ABNORMAL HIGH (ref 1.4–7.7)
Neutrophils Relative %: 84.9 % — ABNORMAL HIGH (ref 43.0–77.0)
Platelets: 186 10*3/uL (ref 150.0–400.0)
RBC: 3.16 Mil/uL — ABNORMAL LOW (ref 3.87–5.11)
RDW: 15.9 % — ABNORMAL HIGH (ref 11.5–15.5)
WBC: 10.7 10*3/uL — ABNORMAL HIGH (ref 4.0–10.5)

## 2020-08-01 LAB — BASIC METABOLIC PANEL
BUN: 39 mg/dL — ABNORMAL HIGH (ref 6–23)
CO2: 32 mEq/L (ref 19–32)
Calcium: 9.8 mg/dL (ref 8.4–10.5)
Chloride: 93 mEq/L — ABNORMAL LOW (ref 96–112)
Creatinine, Ser: 1.74 mg/dL — ABNORMAL HIGH (ref 0.40–1.20)
GFR: 26.69 mL/min — ABNORMAL LOW (ref >60.00)
Glucose, Bld: 146 mg/dL — ABNORMAL HIGH (ref 70–99)
Potassium: 3.9 mEq/L (ref 3.5–5.1)
Sodium: 134 mEq/L — ABNORMAL LOW (ref 135–145)

## 2020-08-01 LAB — URINALYSIS, ROUTINE W REFLEX MICROSCOPIC
Bilirubin Urine: NEGATIVE
Ketones, ur: NEGATIVE
Nitrite: NEGATIVE
Specific Gravity, Urine: 1.01 (ref 1.000–1.030)
Total Protein, Urine: NEGATIVE
Urine Glucose: NEGATIVE
Urobilinogen, UA: 0.2 (ref 0.0–1.0)
pH: 6.5 (ref 5.0–8.0)

## 2020-08-01 LAB — HEMOGLOBIN A1C: Hgb A1c MFr Bld: 5.9 % (ref 4.6–6.5)

## 2020-08-01 LAB — LIPID PANEL
Cholesterol: 201 mg/dL — ABNORMAL HIGH (ref 0–200)
HDL: 57.2 mg/dL (ref >39.00)
NonHDL: 143.67
Total CHOL/HDL Ratio: 4
Triglycerides: 214 mg/dL — ABNORMAL HIGH (ref 0.0–149.0)
VLDL: 42.8 mg/dL — ABNORMAL HIGH (ref 0.0–40.0)

## 2020-08-01 LAB — IBC PANEL
Iron: 44 ug/dL (ref 42–145)
Saturation Ratios: 15 % — ABNORMAL LOW (ref 20.0–50.0)
Transferrin: 209 mg/dL — ABNORMAL LOW (ref 212.0–360.0)

## 2020-08-01 LAB — HEPATIC FUNCTION PANEL
ALT: 27 U/L (ref 0–35)
AST: 23 U/L (ref 0–37)
Albumin: 3.7 g/dL (ref 3.5–5.2)
Alkaline Phosphatase: 54 U/L (ref 39–117)
Bilirubin, Direct: 0.1 mg/dL (ref 0.0–0.3)
Total Bilirubin: 0.4 mg/dL (ref 0.2–1.2)
Total Protein: 6.5 g/dL (ref 6.0–8.3)

## 2020-08-01 LAB — TSH: TSH: 1.19 u[IU]/mL (ref 0.35–4.50)

## 2020-08-01 LAB — LDL CHOLESTEROL, DIRECT: Direct LDL: 109 mg/dL

## 2020-08-03 ENCOUNTER — Other Ambulatory Visit: Payer: Self-pay | Admitting: Internal Medicine

## 2020-08-03 LAB — URINE CULTURE

## 2020-08-04 ENCOUNTER — Encounter: Payer: Self-pay | Admitting: Internal Medicine

## 2020-08-04 NOTE — Assessment & Plan Note (Signed)
stable overall by history and exam, recent data reviewed with pt, and pt to continue medical treatment as before,  to f/u any worsening symptoms or concerns  

## 2020-08-04 NOTE — Assessment & Plan Note (Signed)
stable overall by history and exam, recent data reviewed with pt, and pt to continue medical treatment as before,  to f/u any worsening symptoms or concerns, for f/u lab 

## 2020-08-04 NOTE — Assessment & Plan Note (Addendum)
Etiology unclear, for refer ortho dr Veverly Fells  I spent 31 minutes in preparing to see the patient by review of recent labs, imaging and procedures, obtaining and reviewing separately obtained history, communicating with the patient and family or caregiver, ordering medications, tests or procedures, and documenting clinical information in the EHR including the differential Dx, treatment, and any further evaluation and other management of bilat shooulder pain, anemia, ckdm htn, hyperglycemia, urinary frequency

## 2020-08-04 NOTE — Assessment & Plan Note (Signed)
Also for uA with lab,  to f/u any worsening symptoms or concerns

## 2020-08-08 NOTE — Progress Notes (Signed)
Office Visit Note  Patient: [name]             Date of Birth: [birth year]           MRN: [id number]             PCP: Biagio Borg, MD Referring: Biagio Borg, MD Visit Date: 08/20/2020 Occupation: _0 @  Accompanied by- [name](daughter)  Subjective:  Pain in multiple joints.   History of Present Illness: [name] is a 84 y.o. female seen in consultation per request of Dr. Jenny Reichmann.  According the patient she has had history of arthritis since 1983.  She states she had numbness in her feet at the time and she was seen by rheumatologist who suspected that she had rheumatoid arthritis.  She did not go for follow-up visit.  She states in 2002 and 2003 she had bilateral total knee replacements.  In February 2019 she had lumbar spine surgery.  After that she started having pain and discomfort in her left hand followed by her right hand.  The pain persist in her hands and also started moving up her arms.  She has pain and discomfort in her bilateral shoulders.  She has been told that she has bilateral frozen shoulders.  She states her hands feel cold and she has to wear gloves when she is at [employer].  She has some discomfort in her right hip.  She also complains of pain and discomfort in her bilateral feet.  She has bilateral pedal edema and she has to wear support hose.  She denies having any gout flare.  Activities of Daily Living:  Patient reports morning stiffness for several hours.   Patient Denies nocturnal pain.  Difficulty dressing/grooming: Reports Difficulty climbing stairs: Reports Difficulty getting out of chair: Reports Difficulty using hands for taps, buttons, cutlery, and/or writing: Reports  Review of Systems  Constitutional: Negative for fatigue.  HENT: Positive for mouth dryness. Negative for mouth sores and nose dryness.   Eyes: Negative for pain, itching and dryness.  Respiratory: Negative for shortness of breath and difficulty breathing.     Cardiovascular: Negative for chest pain and palpitations.  Gastrointestinal: Positive for constipation. Negative for blood in stool and diarrhea.  Endocrine: Negative for increased urination.  Genitourinary: Negative for difficulty urinating.  Musculoskeletal: Positive for arthralgias, joint pain, joint swelling, myalgias, morning stiffness, muscle tenderness and myalgias.  Skin: Positive for rash. Negative for color change and redness.  Allergic/Immunologic: Negative for susceptible to infections.  Neurological: Positive for dizziness, memory loss and weakness. Negative for numbness and headaches.  Hematological: Negative for bruising/bleeding tendency.  Psychiatric/Behavioral: Positive for confusion. Negative for sleep disturbance.    PMFS History:  Patient Active Problem List   Diagnosis Date Noted  . Bilateral shoulder pain 07/31/2020  . Chronic pain 04/23/2020  . Supraclavicular fossa fullness 04/23/2020  . Low grade fever 12/07/2019  . OAB (overactive bladder) 12/07/2019  . Pain of right heel 12/07/2019  . Diarrhea 04/13/2019  . Gait disorder 08/30/2018  . Generalized weakness 08/30/2018  . Left conjunctivitis 04/13/2018  . Asthmatic bronchitis 04/13/2018  . Rash 04/13/2018  . Acute bilateral deep vein thrombosis (DVT) of femoral veins ([place]) 01/31/2018  . Hypokalemia 01/31/2018  . Abnormal ECG 01/31/2018  . Elevated CPK 01/31/2018  . Left leg pain 01/13/2018  . Left leg swelling 01/13/2018  . Hearing loss 01/13/2018  . Intractable pain 12/27/2017  . Arachnoiditis 12/27/2017  . Hyponatremia   . S/P lumbar laminectomy  12/23/2017  . Wheezing 12/10/2017  . Bilateral knee pain 12/10/2017  . Itching 11/22/2017  . Urinary frequency 11/22/2017  . Degenerative disc disease, lumbar 11/05/2017  . Left sided sciatica 09/04/2017  . Asthma exacerbation 08/31/2017  . Rheumatoid arthritis flare ([place]) 05/11/2017  . Acute gouty arthritis 05/01/2017  . Vertigo 10/30/2016  .  Allergic rhinitis 10/30/2016  . Bowel incontinence 07/31/2016  . Hemorrhoids 07/31/2016  . Hematochezia 07/31/2016  . Chronic venous insufficiency 07/10/2016  . Diastolic dysfunction 77/41/2878  . Peripheral edema 07/10/2016  . Left ankle pain 06/02/2016  . Neck pain 03/10/2016  . Low back pain 03/10/2016  . Right cervical radiculopathy 11/13/2015  . Bradycardia 03/15/2015  . Arthralgia 12/18/2014  . Fever 11/27/2014  . Rheumatoid arthritis ([place]) 10/23/2014  . Impaired glucose tolerance 07/18/2014  . Right lumbar radiculopathy 07/18/2014  . Encounter for well adult exam with abnormal findings 02/20/2014  . Malignant neoplasm of upper-outer quadrant of left breast in female, estrogen receptor positive ([place]) 11/08/2013  . Diverticulosis 07/09/2012  . Headache(784.0) 06/30/2012  . Anemia 06/29/2012  . Dyspnea 03/11/2012  . Cough 03/11/2012  . CKD (chronic kidney disease) stage 3, GFR 30-59 ml/min ([facility]) 05/18/2011  . GASTROESOPHAGEAL REFLUX DISEASE 07/05/2008  . VITAMIN D DEFICIENCY 12/19/2007  . Morbid obesity ([place]) 12/16/2007  . Essential hypertension 12/16/2007  . DEGENERATIVE JOINT DISEASE 12/16/2007    Past Medical History:  Diagnosis Date  . Allergic rhinitis 10/30/2016  . Anemia   . Asthma   . Breast cancer of upper-outer quadrant of left female breast ([place]) 11/08/2013   ER/PR+ Her2- Left IDC   . Chronic renal insufficiency   . Chronic rhinitis   . Colon polyp   . Diastolic dysfunction 6/76/7209  . DJD (degenerative joint disease)   . Dyspnea   . Full dentures   . GERD (gastroesophageal reflux disease)   . Hearing loss   . Hypertension   . Hyponatremia   . Impaired glucose tolerance 07/18/2014  . Memory loss   . Morbid obesity ([place])   . Poor circulation   . Vertigo   . Wears glasses     Family History  Problem Relation Age of Onset  . Colon cancer Mother        in her 49's  . Stomach cancer Mother   . Lung cancer Brother        was a smoker  . Heart  attack Son 57  . Healthy Daughter    Past Surgical History:  Procedure Laterality Date  . ABDOMINAL HYSTERECTOMY    . BREAST LUMPECTOMY WITH NEEDLE LOCALIZATION AND AXILLARY SENTINEL LYMPH NODE BX Left 12/04/2013   Procedure: BREAST LUMPECTOMY WITH NEEDLE LOCALIZATION AND AXILLARY SENTINEL LYMPH NODE BX;  Surgeon: Shann Medal, MD;  Location: [facility];  Service: General;  Laterality: Left;  . CATARACT EXTRACTION  2009   rt  . COLONOSCOPY    . EYE SURGERY Bilateral    cataract surgery  . KNEE ARTHROSCOPY     both  . LUMBAR LAMINECTOMY/DECOMPRESSION MICRODISCECTOMY Left 12/23/2017   Procedure: Left Lumbar One-Two Laminectomy with microdiscectomy;  Surgeon: Eustace Moore, MD;  Location: [facility];  Service: Neurosurgery;  Laterality: Left;  Left L1-2 Laminectomy with microdiscectomy  . TONSILLECTOMY    . TOTAL KNEE ARTHROPLASTY  2002   rt  . TOTAL KNEE ARTHROPLASTY  2003   left  . VESICOVAGINAL FISTULA CLOSURE W/ TAH  1980   Social History   Social History Narrative  .  Not on file   Immunization History  Administered Date(s) Administered  . Influenza Split 08/07/2011, 10/13/2012  . Influenza Whole 07/27/2008, 08/14/2009, 07/01/2010  . Influenza, High Dose Seasonal PF 07/10/2016, 08/10/2017, 08/30/2018, 09/08/2019  . Influenza,inj,Quad PF,6+ Mos 08/28/2013, 07/18/2014, 07/26/2015  . PFIZER SARS-COV-2 Vaccination 12/11/2019, 01/02/2020  . Pneumococcal Conjugate-13 02/20/2014  . Pneumococcal Polysaccharide-23 10/26/2005  . Tdap 11/22/2017     Objective: Vital Signs: BP 127/65 (BP Location: Right Arm, Patient Position: Sitting, Cuff Size: Normal)   Pulse 82   Resp 15   Ht 5' 1" (1.549 m)   Wt 151 lb 9.6 oz (68.8 kg)   BMI 28.64 kg/m    Physical Exam Vitals and nursing note reviewed.  Constitutional:      Appearance: She is well-developed.  HENT:     Head: Normocephalic and atraumatic.  Eyes:     Conjunctiva/sclera: Conjunctivae normal.  Cardiovascular:       Rate and Rhythm: Normal rate and regular rhythm.     Heart sounds: Normal heart sounds.  Pulmonary:     Effort: Pulmonary effort is normal.     Breath sounds: Normal breath sounds.  Abdominal:     General: Bowel sounds are normal.     Palpations: Abdomen is soft.  Musculoskeletal:     Cervical back: Normal range of motion.  Lymphadenopathy:     Cervical: No cervical adenopathy.  Skin:    General: Skin is warm and dry.     Capillary Refill: Capillary refill takes less than 2 seconds.  Neurological:     Mental Status: She is alert and oriented to person, place, and time.  Psychiatric:        Behavior: Behavior normal.      Musculoskeletal Exam: She has limited range of motion of her cervical and lumbar spine without discomfort.  Shoulder joint abduction was limited to 30 degrees bilaterally.  She has some crepitus in her left elbow but no synovitis was noted.  No synovitis was noted over her wrist joints.  She has bilateral MCP thickening, PIP and DIP thickening but no synovitis.  Hip joints were difficult to assess in the sitting position.  Bilateral knee joints are replaced.  She had bilateral pedal edema.  She has limited range of motion in her ankle joints.  She had overcrowding of her toes and hallux valgus deformity in bilateral  first MTP.  CDAI Exam: CDAI Score: -- Patient Global: --; Provider Global: -- Swollen: --; Tender: -- Joint Exam 08/20/2020   No joint exam has been documented for this visit   There is currently no information documented on the homunculus. Go to the Rheumatology activity and complete the homunculus joint exam.  Investigation: No additional findings.  Imaging: XR Hand 2 View Left  Result Date: 08/20/2020 Narrowing of the first, second and third MCP joints was noted.  PIP and DIP narrowing was noted.  No intercarpal or radiocarpal joint space narrowing was noted.  No erosive changes were noted.  Calcification was noted in the wrist joint.  Impression: These findings are consistent with inflammatory arthritis and osteoarthritis.  Chondrocalcinosis was noted.  XR Hand 2 View Right  Result Date: 08/20/2020 First, second and third MCP narrowing was noted.  Subluxation of first and third MCPs was noted. Calcification around the third MCP was noted.  PIP and DIP narrowing was noted.  No intercarpal, radiocarpal joint space narrowing was noted.  Calcification was noted in the wrist joint. Impression: These findings are consistent with inflammatory arthritis  and osteoarthritis.  Chondrocalcinosis was noted.  XR Shoulder Left  Result Date: 08/20/2020 Severe glenohumeral joint space narrowing was noted.  Acromioclavicular joint space narrowing was noted.  No chondrocalcinosis was noted. Impression: These findings are consistent with severe glenohumeral arthritis and acromioclavicular arthritis.   Recent Labs: Lab Results  Component Value Date   WBC 10.7 (H) 08/01/2020   HGB 10.6 (L) 08/01/2020   PLT 186.0 08/01/2020   NA 134 (L) 08/01/2020   K 3.9 08/01/2020   CL 93 (L) 08/01/2020   CO2 32 08/01/2020   GLUCOSE 146 (H) 08/01/2020   BUN 39 (H) 08/01/2020   CREATININE 1.74 (H) 08/01/2020   BILITOT 0.4 08/01/2020   ALKPHOS 54 08/01/2020   AST 23 08/01/2020   ALT 27 08/01/2020   PROT 6.5 08/01/2020   ALBUMIN 3.7 08/01/2020   CALCIUM 9.8 08/01/2020   GFRAA 44 (L) 10/25/2018    Speciality Comments: No specialty comments available.  Procedures:  No procedures performed Allergies: Hydrocodone, Tizanidine, and Iron   Assessment / Plan:     Visit Diagnoses: Pain in both hands -patient gives history of pain and discomfort in her bilateral hands for several years.  She denies any history of joint swelling.  She states her hands to cold.  She has discomfort in her bilateral shoulders and her arms as well.  On clinical examination she has synovial thickening over MCP joints, DIP and PIP prominence was noted but no synovitis was  noted.  Plan: XR Hand 2 View Right, XR Hand 2 View Left, x-rays obtained today showed MCP narrowing, PIP and DIP narrowing.  She also had chondrocalcinosis.  These findings go along with the inflammatory arthritis most likely due to CPPD.  Sedimentation rate, Rheumatoid factor, Cyclic citrul peptide antibody, IgG, Uric acid, Iron, TIBC and Ferritin Panel, Magnesium  Chondrocalcinosis-was noted in her bilateral wrist joints.  Calcification was noted also around the right third MCP joint.  Chronic pain of both shoulders -she has bilateral frozen shoulders with abduction up to 30 degrees.  Plan: XR Shoulder Left, XR Shoulder Right.  X-ray showed severe glenohumeral joint space narrowing.  She also had acromioclavicular arthritis.  Status post total knee replacement, bilateral-she had bilateral knee replacements in 2002 and 2003.  She states she has been doing quite well.  She mobilizes with the help of a walker.  Pain in both feet -she complains of pain and discomfort in her bilateral feet.  No synovitis was noted.  She has limited range of motion of her ankle joints.  She also has arthritis in her MTPs and PIPs.  No synovitis was noted.  Plan: XR Foot 2 Views Right, XR Foot 2 Views Left severe osteoarthritis and subtalar joint space collapse was noted.  These findings are consistent with inflammatory arthritis and osteoarthritis overlap.  Degenerative disc disease, cervical-she has limited range of motion without much discomfort.  Degenerative disc disease, lumbar - Status post laminectomy.  She has limited range of motion with ongoing pain.  Malignant neoplasm of upper-outer quadrant of left breast in female, estrogen receptor positive (HCC)-followed by Dr. Jana Hakim.  Other medical problems are listed as follows:  Acute bilateral deep vein thrombosis (DVT) of femoral veins ([facility]CC)  Chronic venous insufficiency  Essential hypertension  Mild intermittent asthmatic bronchitis with acute  exacerbation  Diverticulosis  History of gastroesophageal reflux (GERD)  Stage 3 chronic kidney disease, unspecified whether stage 3a or 3b CKD ([facility]CC)  Myalgia - Plan: CK  Orders: Orders Placed This Encounter  Procedures  . XR Shoulder Left  . XR Shoulder Right  . XR Hand 2 View Right  . XR Hand 2 View Left  . XR Foot 2 Views Right  . XR Foot 2 Views Left  . Sedimentation rate  . Rheumatoid factor  . Cyclic citrul peptide antibody, IgG  . Uric acid  . Iron, TIBC and Ferritin Panel  . Magnesium  . CK   No orders of the defined types were placed in this encounter.     Follow-Up Instructions: Return in about 1 year (around 08/20/2021) for pain in multiple joints.   Bo Merino, MD  Note - This record has been created using Editor, commissioning.  Chart creation errors have been sought, but may not always  have been located. Such creation errors do not reflect on  the standard of medical care.

## 2020-08-12 ENCOUNTER — Telehealth: Payer: Self-pay | Admitting: Internal Medicine

## 2020-08-12 NOTE — Telephone Encounter (Signed)
  [employer] calling with patient on the line. Patient is requesting home health services, she states she needs help with bathing [facility] [phone]

## 2020-08-14 NOTE — Telephone Encounter (Signed)
Help with bathing of course is an "aide" level service and by itself does not qualify for home health services  She would need another reason such as CHF or COPD or another condition that would require skilled oversight at home (like the need for a nurse) for this to be paid for by insurance  Pt can always call [facility] for private services for assist with bathing on her own, however, if she wants

## 2020-08-14 NOTE — Telephone Encounter (Signed)
Sent to Dr. John. 

## 2020-08-19 NOTE — Telephone Encounter (Signed)
LDVM for pt along with Dr. Gwynn Burly suggestions along with advising pt to call office with information.

## 2020-08-20 ENCOUNTER — Ambulatory Visit: Payer: Self-pay

## 2020-08-20 ENCOUNTER — Other Ambulatory Visit: Payer: Self-pay

## 2020-08-20 ENCOUNTER — Encounter: Payer: Self-pay | Admitting: Rheumatology

## 2020-08-20 ENCOUNTER — Ambulatory Visit: Payer: Medicare HMO | Admitting: Rheumatology

## 2020-08-20 VITALS — BP 127/65 | HR 82 | Resp 15 | Ht 61.0 in | Wt 151.6 lb

## 2020-08-20 DIAGNOSIS — M109 Gout, unspecified: Secondary | ICD-10-CM

## 2020-08-20 DIAGNOSIS — M79671 Pain in right foot: Secondary | ICD-10-CM | POA: Diagnosis not present

## 2020-08-20 DIAGNOSIS — M25511 Pain in right shoulder: Secondary | ICD-10-CM | POA: Diagnosis not present

## 2020-08-20 DIAGNOSIS — M791 Myalgia, unspecified site: Secondary | ICD-10-CM

## 2020-08-20 DIAGNOSIS — M79672 Pain in left foot: Secondary | ICD-10-CM | POA: Diagnosis not present

## 2020-08-20 DIAGNOSIS — M069 Rheumatoid arthritis, unspecified: Secondary | ICD-10-CM

## 2020-08-20 DIAGNOSIS — K5790 Diverticulosis of intestine, part unspecified, without perforation or abscess without bleeding: Secondary | ICD-10-CM

## 2020-08-20 DIAGNOSIS — M79642 Pain in left hand: Secondary | ICD-10-CM | POA: Diagnosis not present

## 2020-08-20 DIAGNOSIS — N183 Chronic kidney disease, stage 3 unspecified: Secondary | ICD-10-CM

## 2020-08-20 DIAGNOSIS — M25512 Pain in left shoulder: Secondary | ICD-10-CM

## 2020-08-20 DIAGNOSIS — G8929 Other chronic pain: Secondary | ICD-10-CM | POA: Diagnosis not present

## 2020-08-20 DIAGNOSIS — M1120 Other chondrocalcinosis, unspecified site: Secondary | ICD-10-CM

## 2020-08-20 DIAGNOSIS — M5416 Radiculopathy, lumbar region: Secondary | ICD-10-CM

## 2020-08-20 DIAGNOSIS — M5136 Other intervertebral disc degeneration, lumbar region: Secondary | ICD-10-CM | POA: Diagnosis not present

## 2020-08-20 DIAGNOSIS — I82413 Acute embolism and thrombosis of femoral vein, bilateral: Secondary | ICD-10-CM

## 2020-08-20 DIAGNOSIS — J4521 Mild intermittent asthma with (acute) exacerbation: Secondary | ICD-10-CM

## 2020-08-20 DIAGNOSIS — I10 Essential (primary) hypertension: Secondary | ICD-10-CM | POA: Diagnosis not present

## 2020-08-20 DIAGNOSIS — M79641 Pain in right hand: Secondary | ICD-10-CM

## 2020-08-20 DIAGNOSIS — C50412 Malignant neoplasm of upper-outer quadrant of left female breast: Secondary | ICD-10-CM | POA: Diagnosis not present

## 2020-08-20 DIAGNOSIS — Z8719 Personal history of other diseases of the digestive system: Secondary | ICD-10-CM

## 2020-08-20 DIAGNOSIS — I872 Venous insufficiency (chronic) (peripheral): Secondary | ICD-10-CM | POA: Diagnosis not present

## 2020-08-20 DIAGNOSIS — M5030 Other cervical disc degeneration, unspecified cervical region: Secondary | ICD-10-CM

## 2020-08-20 DIAGNOSIS — Z96653 Presence of artificial knee joint, bilateral: Secondary | ICD-10-CM | POA: Diagnosis not present

## 2020-08-20 DIAGNOSIS — M5412 Radiculopathy, cervical region: Secondary | ICD-10-CM

## 2020-08-20 DIAGNOSIS — Z170 Estrogen receptor positive status [ER+]: Secondary | ICD-10-CM

## 2020-08-20 DIAGNOSIS — M51369 Other intervertebral disc degeneration, lumbar region without mention of lumbar back pain or lower extremity pain: Secondary | ICD-10-CM

## 2020-08-20 DIAGNOSIS — Z9889 Other specified postprocedural states: Secondary | ICD-10-CM

## 2020-08-20 NOTE — Patient Instructions (Signed)
COVID-19 vaccine recommendations:   COVID-19 vaccine is recommended for everyone (unless you are allergic to a vaccine component), even if you are on a medication that suppresses your immune system.   Do not take Tylenol or any anti-inflammatory medications (NSAIDs) 24 hours prior to the COVID-19 vaccination.   There is no direct evidence about the efficacy of the COVID-19 vaccine in individuals who are on medications that suppress the immune system.   Even if you are fully vaccinated, and you are on any medications that suppress your immune system, please continue to wear a mask, maintain at least six feet social distance and practice hand hygiene.   If you develop a COVID-19 infection, please contact your PCP or our office to determine if you need monoclonal antibody infusion.  The booster vaccine is now available for immunocompromised patients.   Please see the following web sites for updated information.   [email or web address]           

## 2020-08-21 DIAGNOSIS — Z961 Presence of intraocular lens: Secondary | ICD-10-CM | POA: Diagnosis not present

## 2020-08-21 DIAGNOSIS — H11823 Conjunctivochalasis, bilateral: Secondary | ICD-10-CM | POA: Diagnosis not present

## 2020-08-21 DIAGNOSIS — E119 Type 2 diabetes mellitus without complications: Secondary | ICD-10-CM | POA: Diagnosis not present

## 2020-08-21 DIAGNOSIS — H04123 Dry eye syndrome of bilateral lacrimal glands: Secondary | ICD-10-CM | POA: Diagnosis not present

## 2020-08-21 DIAGNOSIS — H43813 Vitreous degeneration, bilateral: Secondary | ICD-10-CM | POA: Diagnosis not present

## 2020-08-21 DIAGNOSIS — H10413 Chronic giant papillary conjunctivitis, bilateral: Secondary | ICD-10-CM | POA: Diagnosis not present

## 2020-08-21 LAB — IRON,TIBC AND FERRITIN PANEL
%SAT: 23 % (calc) (ref 16–45)
Ferritin: 232 ng/mL (ref 16–288)
Iron: 75 ug/dL (ref 45–160)
TIBC: 329 mcg/dL (calc) (ref 250–450)

## 2020-08-21 LAB — MAGNESIUM: Magnesium: 1.5 mg/dL (ref 1.5–2.5)

## 2020-08-21 LAB — CYCLIC CITRUL PEPTIDE ANTIBODY, IGG: Cyclic Citrullin Peptide Ab: <16 UNITS

## 2020-08-21 LAB — RHEUMATOID FACTOR: Rheumatoid fact SerPl-aCnc: <14 IU/mL (ref <14)

## 2020-08-21 LAB — SEDIMENTATION RATE: Sed Rate: 6 mm/h (ref 0–30)

## 2020-08-21 LAB — URIC ACID: Uric Acid, Serum: 6.9 mg/dL (ref 2.5–7.0)

## 2020-08-21 LAB — CK: Total CK: 117 U/L (ref 29–143)

## 2020-08-21 NOTE — Progress Notes (Signed)
I will discuss results at the follow-up visit.

## 2020-08-21 NOTE — Progress Notes (Signed)
Please schedule a next available new patient follow-up appointment.

## 2020-08-22 DIAGNOSIS — M25511 Pain in right shoulder: Secondary | ICD-10-CM | POA: Insufficient documentation

## 2020-08-22 DIAGNOSIS — M25512 Pain in left shoulder: Secondary | ICD-10-CM | POA: Insufficient documentation

## 2020-08-22 NOTE — Progress Notes (Signed)
Office Visit Note  Patient: [name]             Date of Birth: 17-Jan-1936           MRN: [id number]             PCP: Biagio Borg, MD Referring: Biagio Borg, MD Visit Date: 08/28/2020 Occupation: _0 @  Subjective:  Pain in multiple joints.   History of Present Illness: [name] is a 84 y.o. female with the chondrocalcinosis and osteoarthritis.  She states she continues to have pain and discomfort in her joints.  The pain has been better since she has been taking prednisone on a daily basis.  She has been taking prednisone 6 mg a day currently.  She tapers off prednisone the pain returns.  The pain is mostly in her hands, her knees and her feet  She continues to have discomfort in her neck and lower back.  Activities of Daily Living:  Patient reports morning stiffness for 3 hours.   Patient Denies nocturnal pain.  Difficulty dressing/grooming: Reports Difficulty climbing stairs: Reports Difficulty getting out of chair: Reports Difficulty using hands for taps, buttons, cutlery, and/or writing: Reports  Review of Systems  Constitutional: Negative for fatigue.  HENT: Positive for mouth dryness.   Eyes: Positive for itching and dryness.  Respiratory: Positive for shortness of breath.   Cardiovascular: Positive for swelling in legs/feet.  Gastrointestinal: Positive for constipation.  Endocrine: Positive for cold intolerance, heat intolerance, excessive thirst and increased urination.  Genitourinary: Negative for difficulty urinating.  Musculoskeletal: Positive for arthralgias, gait problem, joint pain, joint swelling, muscle weakness, morning stiffness and muscle tenderness.  Skin: Negative for rash.  Allergic/Immunologic: Negative for susceptible to infections.  Neurological: Positive for weakness.  Hematological: Positive for bruising/bleeding tendency.  Psychiatric/Behavioral: Negative for sleep disturbance.    PMFS History:  Patient Active  Problem List   Diagnosis Date Noted  . Bilateral shoulder pain 07/31/2020  . Chronic pain 04/23/2020  . Supraclavicular fossa fullness 04/23/2020  . Low grade fever 12/07/2019  . OAB (overactive bladder) 12/07/2019  . Pain of right heel 12/07/2019  . Diarrhea 04/13/2019  . Gait disorder 08/30/2018  . Generalized weakness 08/30/2018  . Left conjunctivitis 04/13/2018  . Asthmatic bronchitis 04/13/2018  . Rash 04/13/2018  . Acute bilateral deep vein thrombosis (DVT) of femoral veins ([place]) 01/31/2018  . Hypokalemia 01/31/2018  . Abnormal ECG 01/31/2018  . Elevated CPK 01/31/2018  . Left leg pain 01/13/2018  . Left leg swelling 01/13/2018  . Hearing loss 01/13/2018  . Intractable pain 12/27/2017  . Arachnoiditis 12/27/2017  . Hyponatremia   . S/P lumbar laminectomy 12/23/2017  . Wheezing 12/10/2017  . Bilateral knee pain 12/10/2017  . Itching 11/22/2017  . Urinary frequency 11/22/2017  . Degenerative disc disease, lumbar 11/05/2017  . Left sided sciatica 09/04/2017  . Asthma exacerbation 08/31/2017  . Rheumatoid arthritis flare ([place]) 05/11/2017  . Acute gouty arthritis 05/01/2017  . Vertigo 10/30/2016  . Allergic rhinitis 10/30/2016  . Bowel incontinence 07/31/2016  . Hemorrhoids 07/31/2016  . Hematochezia 07/31/2016  . Chronic venous insufficiency 07/10/2016  . Diastolic dysfunction 37/07/6268  . Peripheral edema 07/10/2016  . Left ankle pain 06/02/2016  . Neck pain 03/10/2016  . Low back pain 03/10/2016  . Right cervical radiculopathy 11/13/2015  . Bradycardia 03/15/2015  . Arthralgia 12/18/2014  . Fever 11/27/2014  . Rheumatoid arthritis ([place]) 10/23/2014  . Impaired glucose tolerance 07/18/2014  . Right lumbar radiculopathy 07/18/2014  .  Encounter for well adult exam with abnormal findings 02/20/2014  . Malignant neoplasm of upper-outer quadrant of left breast in female, estrogen receptor positive ([place]) 11/08/2013  . Diverticulosis 07/09/2012  . Headache(784.0)  06/30/2012  . Anemia 06/29/2012  . Dyspnea 03/11/2012  . Cough 03/11/2012  . CKD (chronic kidney disease) stage 3, GFR 30-59 ml/min (HCC) 05/18/2011  . GASTROESOPHAGEAL REFLUX DISEASE 07/05/2008  . VITAMIN D DEFICIENCY 12/19/2007  . Morbid obesity ([place]) 12/16/2007  . Essential hypertension 12/16/2007  . DEGENERATIVE JOINT DISEASE 12/16/2007    Past Medical History:  Diagnosis Date  . Allergic rhinitis 10/30/2016  . Anemia   . Asthma   . Breast cancer of upper-outer quadrant of left female breast ([place]) 11/08/2013   ER/PR+ Her2- Left IDC   . Chronic renal insufficiency   . Chronic rhinitis   . Colon polyp   . Diastolic dysfunction 0/34/0352  . DJD (degenerative joint disease)   . Dyspnea   . Full dentures   . GERD (gastroesophageal reflux disease)   . Hearing loss   . Hypertension   . Hyponatremia   . Impaired glucose tolerance 07/18/2014  . Memory loss   . Morbid obesity ([place])   . Poor circulation   . Vertigo   . Wears glasses     Family History  Problem Relation Age of Onset  . Colon cancer Mother        in her 74's  . Stomach cancer Mother   . Lung cancer Brother        was a smoker  . Heart attack Son 23  . Healthy Daughter    Past Surgical History:  Procedure Laterality Date  . ABDOMINAL HYSTERECTOMY    . BREAST LUMPECTOMY WITH NEEDLE LOCALIZATION AND AXILLARY SENTINEL LYMPH NODE BX Left 12/04/2013   Procedure: BREAST LUMPECTOMY WITH NEEDLE LOCALIZATION AND AXILLARY SENTINEL LYMPH NODE BX;  Surgeon: Shann Medal, MD;  Location: [facility];  Service: General;  Laterality: Left;  . CATARACT EXTRACTION  2009   rt  . COLONOSCOPY    . EYE SURGERY Bilateral    cataract surgery  . KNEE ARTHROSCOPY     both  . LUMBAR LAMINECTOMY/DECOMPRESSION MICRODISCECTOMY Left 12/23/2017   Procedure: Left Lumbar One-Two Laminectomy with microdiscectomy;  Surgeon: Eustace Moore, MD;  Location: [facility];  Service: Neurosurgery;  Laterality: Left;  Left L1-2 Laminectomy  with microdiscectomy  . TONSILLECTOMY    . TOTAL KNEE ARTHROPLASTY  2002   rt  . TOTAL KNEE ARTHROPLASTY  2003   left  . VESICOVAGINAL FISTULA CLOSURE W/ TAH  1980   Social History   Social History Narrative  . Not on file   Immunization History  Administered Date(s) Administered  . Influenza Split 08/07/2011, 10/13/2012  . Influenza Whole 07/27/2008, 08/14/2009, 07/01/2010  . Influenza, High Dose Seasonal PF 07/10/2016, 08/10/2017, 08/30/2018, 09/08/2019  . Influenza,inj,Quad PF,6+ Mos 08/28/2013, 07/18/2014, 07/26/2015  . PFIZER SARS-COV-2 Vaccination 12/11/2019, 01/02/2020  . Pneumococcal Conjugate-13 02/20/2014  . Pneumococcal Polysaccharide-23 10/26/2005  . Tdap 11/22/2017     Objective: Vital Signs: BP (!) 152/81 (BP Location: Right Arm, Patient Position: Sitting, Cuff Size: Small)   Pulse 69   Resp 12   Ht _0  (1.549 m)   Wt 150 lb (68 kg)   BMI 28.34 kg/m    Physical Exam Vitals and nursing note reviewed.  Constitutional:      Appearance: She is well-developed.  HENT:     Head: Normocephalic and atraumatic.  Eyes:  Conjunctiva/sclera: Conjunctivae normal.  Cardiovascular:     Rate and Rhythm: Normal rate and regular rhythm.     Heart sounds: Normal heart sounds.  Pulmonary:     Effort: Pulmonary effort is normal.     Breath sounds: Normal breath sounds.  Abdominal:     General: Bowel sounds are normal.     Palpations: Abdomen is soft.  Musculoskeletal:     Cervical back: Normal range of motion.  Lymphadenopathy:     Cervical: No cervical adenopathy.  Skin:    General: Skin is warm and dry.     Capillary Refill: Capillary refill takes less than 2 seconds.  Neurological:     Mental Status: She is alert and oriented to person, place, and time.  Psychiatric:        Behavior: Behavior normal.      Musculoskeletal Exam: She has limited range of motion of her cervical spine.  Shoulder joints and elbow joints with good range of motion.  She has  DIP and PIP thickening with no synovitis.  Hip joints and knee joints with good range of motion with no warmth or swelling.  She has good range of motion of her ankle joints.  She had no tenderness over MTPs.  CDAI Exam: CDAI Score: - Patient Global: -; Provider Global: - Swollen: -; Tender: - Joint Exam 08/28/2020   No joint exam has been documented for this visit   There is currently no information documented on the homunculus. Go to the Rheumatology activity and complete the homunculus joint exam.  Investigation: No additional findings.  Imaging: XR Foot 2 Views Left  Result Date: 08/20/2020 First MTP narrowing and subluxation was noted.  PIP and DIP narrowing was noted.  Intertarsal joint space narrowing was noted.  Tibiotalar joint space narrowing was noted.  Subtalar joint collapse was noted.  Inferior and posterior calcaneal spurs were noted. Impression: These findings are consistent with inflammatory arthritis and osteoarthritis overlap.  XR Foot 2 Views Right  Result Date: 08/20/2020 First MTP narrowing and subluxation was noted.  Calcification around the first MTP joint was noted.PIP and DIP narrowing was noted.  No erosive changes were noted.  Calcification of the vessels was noted.  Inferior and posterior calcaneal spurs were noted. Impression: These findings are consistent with osteoarthritis of the foot.  XR Hand 2 View Left  Result Date: 08/20/2020 Narrowing of the first, second and third MCP joints was noted.  PIP and DIP narrowing was noted.  No intercarpal or radiocarpal joint space narrowing was noted.  No erosive changes were noted.  Calcification was noted in the wrist joint. Impression: These findings are consistent with inflammatory arthritis and osteoarthritis.  Chondrocalcinosis was noted.  XR Hand 2 View Right  Result Date: 08/20/2020 First, second and third MCP narrowing was noted.  Subluxation of first and third MCPs was noted. Calcification around the  third MCP was noted.  PIP and DIP narrowing was noted.  No intercarpal, radiocarpal joint space narrowing was noted.  Calcification was noted in the wrist joint. Impression: These findings are consistent with inflammatory arthritis and osteoarthritis.  Chondrocalcinosis was noted.  XR Shoulder Left  Result Date: 08/20/2020 Severe glenohumeral joint space narrowing was noted.  Acromioclavicular joint space narrowing was noted.  No chondrocalcinosis was noted. Impression: These findings are consistent with severe glenohumeral arthritis and acromioclavicular arthritis.  XR Shoulder Right  Result Date: 08/20/2020 Severe glenohumeral joint space narrowing with high rising humerus was noted.  Acromioclavicular arthritis was noted. Impression:  These findings are consistent with severe glenohumeral arthritis and acromioclavicular arthritis.   Recent Labs: Lab Results  Component Value Date   WBC 10.7 (H) 08/01/2020   HGB 10.6 (L) 08/01/2020   PLT 186.0 08/01/2020   NA 134 (L) 08/01/2020   K 3.9 08/01/2020   CL 93 (L) 08/01/2020   CO2 32 08/01/2020   GLUCOSE 146 (H) 08/01/2020   BUN 39 (H) 08/01/2020   CREATININE 1.74 (H) 08/01/2020   BILITOT 0.4 08/01/2020   ALKPHOS 54 08/01/2020   AST 23 08/01/2020   ALT 27 08/01/2020   PROT 6.5 08/01/2020   ALBUMIN 3.7 08/01/2020   CALCIUM 9.8 08/01/2020   GFRAA 44 (L) 10/25/2018   August 20, 2020 iron studies normal, ESR 6, RF negative, anti-CCP negative, uric acid 6.9, magnesium normal, CK 117   Speciality Comments: No specialty comments available.  Procedures:  No procedures performed Allergies: Hydrocodone, Tizanidine, and Iron   Assessment / Plan:     Visit Diagnoses: Chondrocalcinosis-chondrocalcinosis was noted in the wrist joints and knee joints.  Detailed counseling on chondrocalcinosis was provided.  We discussed possible use of colchicine.  As her GFR is 44 I will try colchicine 0.3 mg p.o. daily.  Prescription was given for  colchicine 0.6 mg p.o. daily.  She will take half tablet p.o. daily.  Side effects were discussed at length.  I would also like to taper prednisone by 1 mg every month.  She has been taking prednisone for a long time.  Primary osteoarthritis of both hands-she had no synovitis on examination of her hands.  The clinical and radiographic findings are consistent with osteoarthritis.  Primary osteoarthritis of both shoulders-she has limited range of motion of her shoulder joints and chronic shoulder joint pain.  Status post total knee replacement, bilateral-she complains of chronic pain in her knee joints.  Pain in both feet - With subtalar collapse consistent with inflammatory arthritis.  DDD (degenerative disc disease), cervical-she has limited range of motion and discomfort in the neck.  Degenerative disc disease, lumbar - Status post laminectomy.  Myalgia-CK is normal.  Stage 3 chronic kidney disease, unspecified whether stage 3a or 3b CKD (HCC)  Malignant neoplasm of upper-outer quadrant of left breast in female, estrogen receptor positive ([place])  Acute bilateral deep vein thrombosis (DVT) of femoral veins (HCC)  Chronic venous insufficiency  Essential hypertension  Mild intermittent asthmatic bronchitis with acute exacerbation  Diverticulosis  History of gastroesophageal reflux (GERD)  Orders: No orders of the defined types were placed in this encounter.  No orders of the defined types were placed in this encounter.    Follow-Up Instructions: Return in about 3 months (around 11/28/2020) for CPPD, OA.   Bo Merino, MD  Note - This record has been created using Editor, commissioning.  Chart creation errors have been sought, but may not always  have been located. Such creation errors do not reflect on  the standard of medical care.

## 2020-08-26 DIAGNOSIS — M25512 Pain in left shoulder: Secondary | ICD-10-CM | POA: Diagnosis not present

## 2020-08-26 DIAGNOSIS — M25511 Pain in right shoulder: Secondary | ICD-10-CM | POA: Diagnosis not present

## 2020-08-28 ENCOUNTER — Ambulatory Visit: Payer: Medicare HMO | Admitting: Rheumatology

## 2020-08-28 ENCOUNTER — Other Ambulatory Visit: Payer: Self-pay

## 2020-08-28 ENCOUNTER — Encounter: Payer: Self-pay | Admitting: Rheumatology

## 2020-08-28 ENCOUNTER — Encounter: Payer: Medicare HMO | Admitting: Physical Medicine and Rehabilitation

## 2020-08-28 VITALS — BP 152/81 | HR 69 | Resp 12 | Ht 61.0 in | Wt 150.0 lb

## 2020-08-28 DIAGNOSIS — Z96653 Presence of artificial knee joint, bilateral: Secondary | ICD-10-CM | POA: Diagnosis not present

## 2020-08-28 DIAGNOSIS — M19042 Primary osteoarthritis, left hand: Secondary | ICD-10-CM

## 2020-08-28 DIAGNOSIS — M79671 Pain in right foot: Secondary | ICD-10-CM | POA: Diagnosis not present

## 2020-08-28 DIAGNOSIS — N183 Chronic kidney disease, stage 3 unspecified: Secondary | ICD-10-CM

## 2020-08-28 DIAGNOSIS — K5790 Diverticulosis of intestine, part unspecified, without perforation or abscess without bleeding: Secondary | ICD-10-CM

## 2020-08-28 DIAGNOSIS — M791 Myalgia, unspecified site: Secondary | ICD-10-CM | POA: Diagnosis not present

## 2020-08-28 DIAGNOSIS — M1120 Other chondrocalcinosis, unspecified site: Secondary | ICD-10-CM

## 2020-08-28 DIAGNOSIS — M19041 Primary osteoarthritis, right hand: Secondary | ICD-10-CM

## 2020-08-28 DIAGNOSIS — M19011 Primary osteoarthritis, right shoulder: Secondary | ICD-10-CM | POA: Diagnosis not present

## 2020-08-28 DIAGNOSIS — Z8719 Personal history of other diseases of the digestive system: Secondary | ICD-10-CM

## 2020-08-28 DIAGNOSIS — M5030 Other cervical disc degeneration, unspecified cervical region: Secondary | ICD-10-CM

## 2020-08-28 DIAGNOSIS — I872 Venous insufficiency (chronic) (peripheral): Secondary | ICD-10-CM

## 2020-08-28 DIAGNOSIS — Z5181 Encounter for therapeutic drug level monitoring: Secondary | ICD-10-CM

## 2020-08-28 DIAGNOSIS — M79672 Pain in left foot: Secondary | ICD-10-CM

## 2020-08-28 DIAGNOSIS — M19012 Primary osteoarthritis, left shoulder: Secondary | ICD-10-CM

## 2020-08-28 DIAGNOSIS — I10 Essential (primary) hypertension: Secondary | ICD-10-CM

## 2020-08-28 DIAGNOSIS — Z170 Estrogen receptor positive status [ER+]: Secondary | ICD-10-CM

## 2020-08-28 DIAGNOSIS — M5136 Other intervertebral disc degeneration, lumbar region: Secondary | ICD-10-CM

## 2020-08-28 DIAGNOSIS — C50412 Malignant neoplasm of upper-outer quadrant of left female breast: Secondary | ICD-10-CM

## 2020-08-28 DIAGNOSIS — J4521 Mild intermittent asthma with (acute) exacerbation: Secondary | ICD-10-CM

## 2020-08-28 DIAGNOSIS — I82413 Acute embolism and thrombosis of femoral vein, bilateral: Secondary | ICD-10-CM

## 2020-08-28 MED ORDER — COLCHICINE 0.6 MG PO TABS: 0.3000 mg | ORAL_TABLET | Freq: Every day | ORAL | 1 refills | Status: DC

## 2020-08-28 NOTE — Patient Instructions (Signed)
Please taper prednisone by 1 mg every month.  Start colchicine 0.6 mg, half tablet by mouth daily.  Return in 6 weeks for blood work 2 days prior to your appointment.

## 2020-08-28 NOTE — Addendum Note (Signed)
Addended by: Earnestine Mealing on: 08/28/2020 02:57 PM   Modules accepted: Orders

## 2020-08-29 ENCOUNTER — Other Ambulatory Visit: Payer: Self-pay | Admitting: Internal Medicine

## 2020-09-05 ENCOUNTER — Other Ambulatory Visit: Payer: Self-pay | Admitting: Internal Medicine

## 2020-09-08 ENCOUNTER — Other Ambulatory Visit: Payer: Self-pay | Admitting: Internal Medicine

## 2020-09-08 NOTE — Telephone Encounter (Signed)
Please refill as per office routine med refill policy (all routine meds refilled for 3 mo or monthly per pt preference up to one year from last visit, then month to month grace period for 3 mo, then further med refills will have to be denied)  

## 2020-09-11 ENCOUNTER — Ambulatory Visit: Payer: Medicare HMO | Admitting: Rheumatology

## 2020-10-07 ENCOUNTER — Other Ambulatory Visit: Payer: Self-pay | Admitting: Rheumatology

## 2020-10-07 ENCOUNTER — Other Ambulatory Visit: Payer: Self-pay | Admitting: Internal Medicine

## 2020-10-07 NOTE — Telephone Encounter (Signed)
LMOM for patient to call and schedule follow-up appointment.   °

## 2020-10-07 NOTE — Telephone Encounter (Signed)
Please schedule patient for a follow up visit. Patient due February 2022. Thanks!  

## 2020-10-07 NOTE — Telephone Encounter (Signed)
Last Visit: 08/28/2020 Next Visit: due February 2022. Message sent to the front to schedule.  Labs: 08/01/2020 WBC 10.7, RBC 3.16, Hgb 10.6, Hct 31.6, MCV 100.2, RDW 15.9, Neutrophils Relative 8.9, Neutro Abs 9.1, Sodium 134, Chloride 93, Glucose 149 BUN 39, Creat. 1.74, GFR 26.69  Current Dose per office note 08/28/2020: colchicine 0.6 mg p.o. daily.  She will take half tablet p.o. daily.  DX: Chondrocalcinosis  Okay to refill Colchicine?

## 2020-10-07 NOTE — Telephone Encounter (Signed)
Please refill as per office routine med refill policy (all routine meds refilled for 3 mo or monthly per pt preference up to one year from last visit, then month to month grace period for 3 mo, then further med refills will have to be denied)  

## 2020-10-09 ENCOUNTER — Telehealth: Payer: Self-pay | Admitting: Internal Medicine

## 2020-10-09 DIAGNOSIS — Z6828 Body mass index (BMI) 28.0-28.9, adult: Secondary | ICD-10-CM | POA: Diagnosis not present

## 2020-10-09 DIAGNOSIS — Z01419 Encounter for gynecological examination (general) (routine) without abnormal findings: Secondary | ICD-10-CM | POA: Diagnosis not present

## 2020-10-09 NOTE — Telephone Encounter (Signed)
I received a renewal parking placard.   Form has been completed &Placed in providers box to review and sign.

## 2020-10-10 NOTE — Telephone Encounter (Signed)
Form has been signed, Copy sent to scan.   LVM to inform patient the form is ready to be picked up.

## 2020-10-22 ENCOUNTER — Other Ambulatory Visit: Payer: Self-pay | Admitting: Physical Medicine and Rehabilitation

## 2020-10-22 ENCOUNTER — Other Ambulatory Visit: Payer: Self-pay | Admitting: Internal Medicine

## 2020-11-07 ENCOUNTER — Telehealth: Payer: Self-pay | Admitting: Internal Medicine

## 2020-11-07 ENCOUNTER — Other Ambulatory Visit: Payer: Self-pay | Admitting: Internal Medicine

## 2020-11-07 NOTE — Telephone Encounter (Signed)
triamcinolone cream (KENALOG) [id number] - [place], [place] - [address] AT [address] [place] Phone:  [phone]  Fax:  [phone]     Patient wondering if she can get a refill for this, she says she needs this for the gout and rash on her toes. States she has had it before for this same reason. Denies appointment at this time.

## 2020-11-08 MED ORDER — TRIAMCINOLONE ACETONIDE 0.1 % EX CREA: 1.0000 "application " | TOPICAL_CREAM | Freq: Two times a day (BID) | CUTANEOUS | 1 refills | Status: DC

## 2020-11-12 NOTE — Telephone Encounter (Signed)
Dr. Jenny Reichmann refilled on 11/08/20 for patient.

## 2020-11-18 ENCOUNTER — Telehealth: Payer: Self-pay | Admitting: Internal Medicine

## 2020-11-18 NOTE — Telephone Encounter (Signed)
Patient called and said that she is drinking Ensure and she said she has gas from it. She said she was told there was a pill she could take before drinking Ensure. Please call her back at [phone].

## 2020-11-18 NOTE — Telephone Encounter (Signed)
Ok to take gas-x otc prior to ensure

## 2020-11-19 ENCOUNTER — Telehealth: Payer: Self-pay | Admitting: Rheumatology

## 2020-11-19 ENCOUNTER — Telehealth: Payer: Self-pay

## 2020-11-19 NOTE — Telephone Encounter (Signed)
Patient left a message 11/18/2020 requesting a call back to discuss topical medication Dr. Estanislado Pandy recommended for her. Please call to advise.

## 2020-11-19 NOTE — Telephone Encounter (Signed)
Patient may use icy hot topically.

## 2020-11-19 NOTE — Telephone Encounter (Signed)
Patient states she is having pain in her hands and arms. Patient states she misplaced the paper with the recommendation of the cream for this. Please advise.

## 2020-11-19 NOTE — Telephone Encounter (Signed)
Attempted to contact patient and left message to advise patient may use icy hot topically.

## 2020-11-19 NOTE — Telephone Encounter (Signed)
Patient called requesting a return call to let her know when she is due for labwork.

## 2020-11-19 NOTE — Telephone Encounter (Signed)
Patient has been notified to take gas ex

## 2020-11-19 NOTE — Telephone Encounter (Signed)
Left message to advise patient she is due for labs 2 days prior to her appointment.

## 2020-11-21 ENCOUNTER — Telehealth: Payer: Self-pay | Admitting: Internal Medicine

## 2020-11-21 NOTE — Telephone Encounter (Signed)
   [facility] calling, states they are faxing a form for completion for a "back wrap"

## 2020-11-25 ENCOUNTER — Telehealth: Payer: Self-pay

## 2020-11-25 ENCOUNTER — Telehealth: Payer: Self-pay | Admitting: Internal Medicine

## 2020-11-25 NOTE — Telephone Encounter (Signed)
Patient called and said she spoke with her insurance company and they told her she was in need for home health assistance. She can be reached at [phone].

## 2020-11-25 NOTE — Telephone Encounter (Signed)
Needs OV as I am not able to assist in this way without an OV in the past 90 days

## 2020-11-25 NOTE — Telephone Encounter (Signed)
Spoke with pt and she is going to call the office back to schedule the appt after she speaks with her daughter.

## 2020-11-27 ENCOUNTER — Ambulatory Visit (INDEPENDENT_AMBULATORY_CARE_PROVIDER_SITE_OTHER): Payer: Medicare HMO | Admitting: Internal Medicine

## 2020-11-27 ENCOUNTER — Other Ambulatory Visit: Payer: Self-pay

## 2020-11-27 ENCOUNTER — Encounter: Payer: Self-pay | Admitting: Internal Medicine

## 2020-11-27 VITALS — BP 118/66 | HR 81 | Temp 98.1°F | Ht 61.0 in | Wt 151.0 lb

## 2020-11-27 DIAGNOSIS — I10 Essential (primary) hypertension: Secondary | ICD-10-CM | POA: Diagnosis not present

## 2020-11-27 DIAGNOSIS — Z23 Encounter for immunization: Secondary | ICD-10-CM

## 2020-11-27 DIAGNOSIS — N1831 Chronic kidney disease, stage 3a: Secondary | ICD-10-CM

## 2020-11-27 DIAGNOSIS — R269 Unspecified abnormalities of gait and mobility: Secondary | ICD-10-CM | POA: Diagnosis not present

## 2020-11-27 DIAGNOSIS — R7302 Impaired glucose tolerance (oral): Secondary | ICD-10-CM | POA: Diagnosis not present

## 2020-11-27 NOTE — Patient Instructions (Addendum)
You had the flu shot today  Please continue all other medications as before, and refills have been done if requested.  Please have the pharmacy call with any other refills you may need.  Please continue your efforts at being more active, low cholesterol diet, and weight control.  Please keep your appointments with your specialists as you may have planned - rheumatology on feb 24 with labs  You will be contacted regarding the referral for: Home Health with RN, PT and aide  Please make an Appointment to return in 4 months

## 2020-11-27 NOTE — Progress Notes (Signed)
Established Patient Office Visit  Subjective:  Patient ID: [name], female    DOB: 11-07-1935  Age: 85 y.o. MRN: [id number]         Chief Complaint: ? Recent worsening back pain, gait d/o, asthma       HPI:  [name] is a 85 y.o. female here to f/u partly as I received a written request from [place] supposedly regarding the need for some type of back brace, and wanting medical records to support this and a prescription for this.  Pt is here with daughter, and it turns out, the patient had returned a call from this medicare scammers who convinced her to say yes to the need for some type of back brace  Here today, pt denies significant back pain and does not need a brace.  Pt does have worsening mild to mod difficulty in general weakness and gait however, daughter concerned about risk of fall, and asking for [facility] with RN, PT and aide as pt appaers to have at least some increased risk of fall recently.  No actual falls or injury,  Pt continues to have recurring LBP without change in severity, bowel or bladder change, fever, wt loss,  worsening LE pain/numbness.  Pt denies chest pain, increased sob or doe, wheezing, orthopnea, PND, increased LE swelling, palpitations, dizziness or syncope.   Pt denies polydipsia, polyuria, Pt due for flu shot.  No other specific complaints    Wt Readings from Last 3 Encounters:  11/27/20 151 lb (68.5 kg)  08/28/20 150 lb (68 kg)  08/20/20 151 lb 9.6 oz (68.8 kg)   BP Readings from Last 3 Encounters:  11/27/20 118/66  08/28/20 (!) 152/81  08/20/20 127/65         Past Medical History:  Diagnosis Date  . Allergic rhinitis 10/30/2016  . Anemia   . Asthma   . Breast cancer of upper-outer quadrant of left female breast ([place]) 11/08/2013   ER/PR+ Her2- Left IDC   . Chronic renal insufficiency   . Chronic rhinitis   . Colon polyp   . Diastolic dysfunction 0/94/7096  . DJD (degenerative joint disease)   . Dyspnea   . Full dentures   .  GERD (gastroesophageal reflux disease)   . Hearing loss   . Hypertension   . Hyponatremia   . Impaired glucose tolerance 07/18/2014  . Memory loss   . Morbid obesity ([place])   . Poor circulation   . Vertigo   . Wears glasses    Past Surgical History:  Procedure Laterality Date  . ABDOMINAL HYSTERECTOMY    . BREAST LUMPECTOMY WITH NEEDLE LOCALIZATION AND AXILLARY SENTINEL LYMPH NODE BX Left 12/04/2013   Procedure: BREAST LUMPECTOMY WITH NEEDLE LOCALIZATION AND AXILLARY SENTINEL LYMPH NODE BX;  Surgeon: Shann Medal, MD;  Location: [place];  Service: General;  Laterality: Left;  . CATARACT EXTRACTION  2009   rt  . COLONOSCOPY    . EYE SURGERY Bilateral    cataract surgery  . KNEE ARTHROSCOPY     both  . LUMBAR LAMINECTOMY/DECOMPRESSION MICRODISCECTOMY Left 12/23/2017   Procedure: Left Lumbar One-Two Laminectomy with microdiscectomy;  Surgeon: Eustace Moore, MD;  Location: [place];  Service: Neurosurgery;  Laterality: Left;  Left L1-2 Laminectomy with microdiscectomy  . TONSILLECTOMY    . TOTAL KNEE ARTHROPLASTY  2002   rt  . TOTAL KNEE ARTHROPLASTY  2003   left  . VESICOVAGINAL FISTULA CLOSURE W/ TAH  1980  reports that she has never smoked. She has never used smokeless tobacco. She reports that she does not drink alcohol and does not use drugs. family history includes Colon cancer in her mother; Healthy in her daughter; Heart attack (age of onset: 85) in her son; Lung cancer in her brother; Stomach cancer in her mother. Allergies  Allergen Reactions  . Hydrocodone Itching  . Tizanidine Other (See Comments)    Dizzy and fall  . Iron Hives and Other (See Comments)    [place], bad constipation Other reaction(s): Unknown   Current Outpatient Medications on File Prior to Visit  Medication Sig Dispense Refill  . ADVAIR DISKUS 250-50 MCG/DOSE AEPB INHALE 1 PUFF BY MOUTH TWICE A DAY 180 each 3  . albuterol (PROVENTIL HFA;VENTOLIN HFA) 108 (90 Base) MCG/ACT inhaler  Inhale 2 puffs into the lungs every 6 (six) hours as needed for wheezing or shortness of breath. 1 Inhaler 11  . amLODipine (NORVASC) 5 MG tablet TAKE 1 TABLET BY MOUTH EVERY DAY 90 tablet 3  . aspirin 81 MG tablet Take 81 mg by mouth daily.    . colchicine 0.6 MG tablet TAKE 0.5 TABLETS BY MOUTH DAILY. 45 tablet 1  . diclofenac Sodium (VOLTAREN) 1 % GEL Apply 2 g topically 4 (four) times daily. 100 g 3  . Ensure (ENSURE) Take 1 Can by mouth 3 (three) times daily between meals. 237 mL 12  . FERREX 150 150 MG capsule TAKE 1 CAPSULE BY MOUTH TWICE A DAY 60 capsule 1  . furosemide (LASIX) 20 MG tablet TAKE 1 TABLET BY MOUTH EVERY DAY AS NEEDED 90 tablet 1  . gabapentin (NEURONTIN) 300 MG capsule TAKE 1 CAPSULE BY MOUTH THREE TIMES A DAY 90 capsule 5  . Incontinence Supply Disposable (DEPEND UNDERWEAR SM/MED) MISC Use as directed four times per day 120 each 5  . losartan-hydrochlorothiazide (HYZAAR) 100-25 MG tablet TAKE 1 TABLET BY MOUTH EVERY DAY 90 tablet 2  . Magnesium Gluconate 500 (27 Mg) MG TABS TAKE 1 TABLET BY MOUTH TWICE A DAY 90 tablet 2  . meclizine (ANTIVERT) 12.5 MG tablet TAKE 1 TABLET BY MOUTH THREE TIMES A DAY AS NEEDED FOR DIZZINESS 270 tablet 0  . methocarbamol (ROBAXIN) 500 MG tablet Take 1 tablet (500 mg total) by mouth every 8 (eight) hours as needed for muscle spasms. 10 tablet 0  . MOMETASONE-AMMONIUM LACTATE EX mometasone    . montelukast (SINGULAIR) 10 MG tablet TAKE 1 TABLET BY MOUTH EVERY DAY 90 tablet 3  . Multiple Vitamins-Minerals (CENTRUM SILVER PO) Take 1 tablet by mouth daily.    . pantoprazole (PROTONIX) 40 MG tablet TAKE 1 TABLET BY MOUTH EVERY DAY 90 tablet 2  . potassium chloride (KLOR-CON) 10 MEQ tablet TAKE 1 TABLET BY MOUTH EVERY DAY WHEN TAKING FUROSEMIDE 90 tablet 3  . predniSONE (DELTASONE) 1 MG tablet Take 4 mg by mouth daily.    . predniSONE (DELTASONE) 5 MG tablet Take 5 mg by mouth daily with breakfast.    . Simethicone (GAS-X PO) Take 1 tablet by  mouth daily as needed (for gas).     . solifenacin (VESICARE) 5 MG tablet Take 1 tablet (5 mg total) by mouth daily. 90 tablet 3  . tizanidine (ZANAFLEX) 2 MG capsule TAKE 1 CAPSULE BY MOUTH 3 TIMES A DAY AS NEEDED FOR MUSCLE SPASMS 40 capsule 1  . traMADol (ULTRAM-ER) 200 MG 24 hr tablet TAKE 1 TABLET BY MOUTH EVERY DAY 90 tablet 1  . triamcinolone (KENALOG) 0.1 %  Apply 1 application topically 2 (two) times daily. 30 g 1  . triamcinolone (NASACORT AQ) 55 MCG/ACT AERO nasal inhaler Place 2 sprays into the nose daily. 1 Inhaler 12   No current facility-administered medications on file prior to visit.        ROS:  All others reviewed and negative.  Objective        PE:  BP 118/66   Pulse 81   Temp 98.1 F (36.7 C) (Oral)   Ht $R'5\' 1"'uq$  (1.549 m)   Wt 151 lb (68.5 kg)   SpO2 97%   BMI 28.53 kg/m                 Constitutional: Pt appears in NAD               HENT: Head: NCAT.                Right Ear: External ear normal.                 Left Ear: External ear normal.                Eyes: . Pupils are equal, round, and reactive to light. Conjunctivae and EOM are normal               Nose: without d/c or deformity               Neck: Neck supple. Gross normal ROM               Cardiovascular: Normal rate and regular rhythm.                 Pulmonary/Chest: Effort normal and breath sounds without rales or wheezing.                Abd:  Soft, NT, ND, + BS, no organomegaly; spine nontender in midline               Neurological: Pt is alert. At baseline orientation, motor grossly intact               Skin: Skin is warm. No rashes, no other new lesions, LE edema - trace bilat               Psychiatric: Pt behavior is normal without agitation   Micro: none  Cardiac tracings I have personally interpreted today:  none  Pertinent Radiological findings (summarize): none   Lab Results  Component Value Date   WBC 10.7 (H) 08/01/2020   HGB 10.6 (L) 08/01/2020   HCT 31.6 (L) 08/01/2020    PLT 186.0 08/01/2020   GLUCOSE 146 (H) 08/01/2020   CHOL 201 (H) 08/01/2020   TRIG 214.0 (H) 08/01/2020   HDL 57.20 08/01/2020   LDLDIRECT 109.0 08/01/2020   LDLCALC 90 12/07/2019   ALT 27 08/01/2020   AST 23 08/01/2020   NA 134 (L) 08/01/2020   K 3.9 08/01/2020   CL 93 (L) 08/01/2020   CREATININE 1.74 (H) 08/01/2020   BUN 39 (H) 08/01/2020   CO2 32 08/01/2020   TSH 1.19 08/01/2020   INR 1.35 01/17/2018   HGBA1C 5.9 08/01/2020   Assessment/Plan:  Ilean MAKINZE JANI is a 85 y.o. Black or African American [2] female with  has a past medical history of Allergic rhinitis (10/30/2016), Anemia, Asthma, Breast cancer of upper-outer quadrant of left female breast ([place]) (11/08/2013), Chronic renal insufficiency, Chronic rhinitis, Colon polyp, Diastolic dysfunction (6/64/4034), DJD (degenerative joint disease), Dyspnea,  Full dentures, GERD (gastroesophageal reflux disease), Hearing loss, Hypertension, Hyponatremia, Impaired glucose tolerance (07/18/2014), Memory loss, Morbid obesity ([place]), Poor circulation, Vertigo, and Wears glasses.  Impaired glucose tolerance Lab Results  Component Value Date   HGBA1C 5.9 08/01/2020   Stable, pt to continue current medical treatment  - diet  Essential hypertension BP Readings from Last 3 Encounters:  11/27/20 118/66  08/28/20 (!) 152/81  08/20/20 127/65   Stable, pt to continue medical treatment norvasc, lasix, hyzaar  Current Outpatient Medications (Endocrine & Metabolic):  .  predniSONE (DELTASONE) 1 MG tablet, Take 4 mg by mouth daily. .  predniSONE (DELTASONE) 5 MG tablet, Take 5 mg by mouth daily with breakfast.  Current Outpatient Medications (Cardiovascular):  .  amLODipine (NORVASC) 5 MG tablet, TAKE 1 TABLET BY MOUTH EVERY DAY .  furosemide (LASIX) 20 MG tablet, TAKE 1 TABLET BY MOUTH EVERY DAY AS NEEDED .  losartan-hydrochlorothiazide (HYZAAR) 100-25 MG tablet, TAKE 1 TABLET BY MOUTH EVERY DAY  Current Outpatient Medications  (Respiratory):  [name]  ADVAIR DISKUS 250-50 MCG/DOSE AEPB, INHALE 1 PUFF BY MOUTH TWICE A DAY .  albuterol (PROVENTIL HFA;VENTOLIN HFA) 108 (90 Base) MCG/ACT inhaler, Inhale 2 puffs into the lungs every 6 (six) hours as needed for wheezing or shortness of breath. .  montelukast (SINGULAIR) 10 MG tablet, TAKE 1 TABLET BY MOUTH EVERY DAY .  triamcinolone (NASACORT AQ) 55 MCG/ACT AERO nasal inhaler, Place 2 sprays into the nose daily.  Current Outpatient Medications (Analgesics):  .  aspirin 81 MG tablet, Take 81 mg by mouth daily. .  colchicine 0.6 MG tablet, TAKE 0.5 TABLETS BY MOUTH DAILY. .  traMADol (ULTRAM-ER) 200 MG 24 hr tablet, TAKE 1 TABLET BY MOUTH EVERY DAY  Current Outpatient Medications (Hematological):  [name]  FERREX 150 150 MG capsule, TAKE 1 CAPSULE BY MOUTH TWICE A DAY  Current Outpatient Medications (Other):  .  diclofenac Sodium (VOLTAREN) 1 % GEL, Apply 2 g topically 4 (four) times daily. .  Ensure (ENSURE), Take 1 Can by mouth 3 (three) times daily between meals. .  gabapentin (NEURONTIN) 300 MG capsule, TAKE 1 CAPSULE BY MOUTH THREE TIMES A DAY .  Incontinence Supply Disposable (DEPEND UNDERWEAR SM/MED) MISC, Use as directed four times per day .  Magnesium Gluconate 500 (27 Mg) MG TABS, TAKE 1 TABLET BY MOUTH TWICE A DAY .  meclizine (ANTIVERT) 12.5 MG tablet, TAKE 1 TABLET BY MOUTH THREE TIMES A DAY AS NEEDED FOR DIZZINESS .  methocarbamol (ROBAXIN) 500 MG tablet, Take 1 tablet (500 mg total) by mouth every 8 (eight) hours as needed for muscle spasms. .  MOMETASONE-AMMONIUM LACTATE EX, mometasone .  Multiple Vitamins-Minerals (CENTRUM SILVER PO), Take 1 tablet by mouth daily. .  pantoprazole (PROTONIX) 40 MG tablet, TAKE 1 TABLET BY MOUTH EVERY DAY .  potassium chloride (KLOR-CON) 10 MEQ tablet, TAKE 1 TABLET BY MOUTH EVERY DAY WHEN TAKING FUROSEMIDE .  Simethicone (GAS-X PO), Take 1 tablet by mouth daily as needed (for gas).  .  solifenacin (VESICARE) 5 MG tablet, Take 1  tablet (5 mg total) by mouth daily. .  tizanidine (ZANAFLEX) 2 MG capsule, TAKE 1 CAPSULE BY MOUTH 3 TIMES A DAY AS NEEDED FOR MUSCLE SPASMS .  triamcinolone (KENALOG) 0.1 %, Apply 1 application topically 2 (two) times daily.   CKD (chronic kidney disease) stage 3, GFR 30-59 ml/min (HCC) Lab Results  Component Value Date   CREATININE 1.74 (H) 08/01/2020   Stable overall, cont to avoid nephrotoxins  Gait  disorder Ok for [facility] referral for PT and aide; no need for back brace of any kind for now  Followup: Return in about 4 months (around 03/27/2021).  Cathlean Cower, MD 12/04/2020 9:13 PM [facility]

## 2020-11-29 NOTE — Telephone Encounter (Signed)
[facility] called back, states they need a letter completed and faxed for the "back wrap". The forms were completed correctly but the insurance needs a separate letter for the "back wrap" to be covered.   Please include the medical diagnosis and OV in the fax as well.  Fax# [phone]

## 2020-12-02 NOTE — Telephone Encounter (Signed)
Sorry I dont understand what a back wrap is, so I dont understand the purpose or what the letter is supposed to say

## 2020-12-04 ENCOUNTER — Encounter: Payer: Self-pay | Admitting: Internal Medicine

## 2020-12-04 NOTE — Telephone Encounter (Signed)
Oh I recall now.  This is a medicare scam   Please disregard anything needed by [facility]  Pt was recently seen here.  She had been called by these people, and per the daughter at the visit, the pt had called back and consented to the need for some kind of back brace.  We determined at her visit that a back brace is NOT needed, and anything to do with this can be disregarded as scam behavior.

## 2020-12-04 NOTE — Assessment & Plan Note (Signed)
BP Readings from Last 3 Encounters:  11/27/20 118/66  08/28/20 (!) 152/81  08/20/20 127/65   Stable, pt to continue medical treatment norvasc, lasix, hyzaar  Current Outpatient Medications (Endocrine & Metabolic):  .  predniSONE (DELTASONE) 1 MG tablet, Take 4 mg by mouth daily. .  predniSONE (DELTASONE) 5 MG tablet, Take 5 mg by mouth daily with breakfast.  Current Outpatient Medications (Cardiovascular):  .  amLODipine (NORVASC) 5 MG tablet, TAKE 1 TABLET BY MOUTH EVERY DAY .  furosemide (LASIX) 20 MG tablet, TAKE 1 TABLET BY MOUTH EVERY DAY AS NEEDED .  losartan-hydrochlorothiazide (HYZAAR) 100-25 MG tablet, TAKE 1 TABLET BY MOUTH EVERY DAY  Current Outpatient Medications (Respiratory):  [name]  ADVAIR DISKUS 250-50 MCG/DOSE AEPB, INHALE 1 PUFF BY MOUTH TWICE A DAY .  albuterol (PROVENTIL HFA;VENTOLIN HFA) 108 (90 Base) MCG/ACT inhaler, Inhale 2 puffs into the lungs every 6 (six) hours as needed for wheezing or shortness of breath. .  montelukast (SINGULAIR) 10 MG tablet, TAKE 1 TABLET BY MOUTH EVERY DAY .  triamcinolone (NASACORT AQ) 55 MCG/ACT AERO nasal inhaler, Place 2 sprays into the nose daily.  Current Outpatient Medications (Analgesics):  .  aspirin 81 MG tablet, Take 81 mg by mouth daily. .  colchicine 0.6 MG tablet, TAKE 0.5 TABLETS BY MOUTH DAILY. .  traMADol (ULTRAM-ER) 200 MG 24 hr tablet, TAKE 1 TABLET BY MOUTH EVERY DAY  Current Outpatient Medications (Hematological):  [name]  FERREX 150 150 MG capsule, TAKE 1 CAPSULE BY MOUTH TWICE A DAY  Current Outpatient Medications (Other):  .  diclofenac Sodium (VOLTAREN) 1 % GEL, Apply 2 g topically 4 (four) times daily. .  Ensure (ENSURE), Take 1 Can by mouth 3 (three) times daily between meals. .  gabapentin (NEURONTIN) 300 MG capsule, TAKE 1 CAPSULE BY MOUTH THREE TIMES A DAY .  Incontinence Supply Disposable (DEPEND UNDERWEAR SM/MED) MISC, Use as directed four times per day .  Magnesium Gluconate 500 (27 Mg) MG TABS, TAKE 1  TABLET BY MOUTH TWICE A DAY .  meclizine (ANTIVERT) 12.5 MG tablet, TAKE 1 TABLET BY MOUTH THREE TIMES A DAY AS NEEDED FOR DIZZINESS .  methocarbamol (ROBAXIN) 500 MG tablet, Take 1 tablet (500 mg total) by mouth every 8 (eight) hours as needed for muscle spasms. .  MOMETASONE-AMMONIUM LACTATE EX, mometasone .  Multiple Vitamins-Minerals (CENTRUM SILVER PO), Take 1 tablet by mouth daily. .  pantoprazole (PROTONIX) 40 MG tablet, TAKE 1 TABLET BY MOUTH EVERY DAY .  potassium chloride (KLOR-CON) 10 MEQ tablet, TAKE 1 TABLET BY MOUTH EVERY DAY WHEN TAKING FUROSEMIDE .  Simethicone (GAS-X PO), Take 1 tablet by mouth daily as needed (for gas).  .  solifenacin (VESICARE) 5 MG tablet, Take 1 tablet (5 mg total) by mouth daily. .  tizanidine (ZANAFLEX) 2 MG capsule, TAKE 1 CAPSULE BY MOUTH 3 TIMES A DAY AS NEEDED FOR MUSCLE SPASMS .  triamcinolone (KENALOG) 0.1 %, Apply 1 application topically 2 (two) times daily.

## 2020-12-04 NOTE — Telephone Encounter (Signed)
See below

## 2020-12-04 NOTE — Assessment & Plan Note (Addendum)
[place] for [name] [facility] referral for PT and aide; no need for back brace of any kind for now

## 2020-12-04 NOTE — Telephone Encounter (Signed)
[facility] calling back, stating they received a letter from on on 01.27.22 for M54.59 lower back pain so they were trying to find a brace or something to help with that. Just anything stating why we thought the patient would benefit from using a back brace

## 2020-12-04 NOTE — Assessment & Plan Note (Signed)
Lab Results  Component Value Date   CREATININE 1.74 (H) 08/01/2020   Stable overall, cont to avoid nephrotoxins

## 2020-12-04 NOTE — Assessment & Plan Note (Signed)
Lab Results  Component Value Date   HGBA1C 5.9 08/01/2020   Stable, pt to continue current medical treatment  - diet

## 2020-12-05 ENCOUNTER — Telehealth: Payer: Self-pay | Admitting: Internal Medicine

## 2020-12-05 NOTE — Progress Notes (Signed)
Office Visit Note  Patient: [name]             Date of Birth: 02-23-1936           MRN: [id number]             PCP: Biagio Borg, MD Referring: Biagio Borg, MD Visit Date: 12/19/2020 Occupation: @GUAROCC @  Subjective:  Pain in both feet   History of Present Illness: [name] is a 85 y.o. female with a history of chondrocalcinosis accompanied by her daughter [name]. She is currently taking prednisone 6 mg and plans to taper to 5 mg in March. She has been on prednisone for several years. She is also taking colchicine 0.3 mg daily. She notes she has had increased flares of muscle and joint pain, with the most significant being in her proximal left arm. She also has had soreness in her toes. The pain has been the worst in the last two weeks. She also notes she has been stiff which has limited her ability to get around effectively. She is unsure if she has had any swelling in her joints but notes she has swelling in her left arm and occasionally in her ankles. She denies any warmth in her joints. She has had difficulty putting on shoes. She is currently using a walker when she leaves the house. At home she uses a cane. She does not think her current regimen is effectively holding her pain. She has sicca symptoms. She is not currently active.   Her uric acid on 08/20/2020 was 6.9.  She has noticed a new growth on her left neck which has been evaluated by her GYN.   Activities of Daily Living:  Patient reports morning stiffness for 1-1.5  hours.   Patient Denies nocturnal pain.  Difficulty dressing/grooming: Reports Difficulty climbing stairs: Reports Difficulty getting out of chair: Reports Difficulty using hands for taps, buttons, cutlery, and/or writing: Reports  Review of Systems  Constitutional: Positive for fatigue.  HENT: Positive for mouth dryness and nose dryness. Negative for mouth sores.   Eyes: Positive for pain, itching and dryness.  Respiratory:  Positive for shortness of breath and wheezing.   Cardiovascular: Negative for chest pain and palpitations.  Gastrointestinal: Positive for constipation. Negative for blood in stool and diarrhea.  Endocrine: Positive for increased urination.  Genitourinary: Negative for difficulty urinating.  Musculoskeletal: Positive for arthralgias, joint pain, joint swelling, myalgias, morning stiffness, muscle tenderness and myalgias.  Skin: Negative for color change, rash and redness.  Allergic/Immunologic: Negative for susceptible to infections.  Neurological: Positive for dizziness and headaches. Negative for numbness and memory loss.  Hematological: Positive for bruising/bleeding tendency.  Psychiatric/Behavioral: Negative for confusion and sleep disturbance.    PMFS History:  Patient Active Problem List   Diagnosis Date Noted  . Chondrocalcinosis 12/19/2020  . Primary osteoarthritis of both hands 12/19/2020  . Bilateral shoulder pain 07/31/2020  . Chronic pain 04/23/2020  . Supraclavicular fossa fullness 04/23/2020  . Low grade fever 12/07/2019  . OAB (overactive bladder) 12/07/2019  . Pain of right heel 12/07/2019  . Diarrhea 04/13/2019  . Gait disorder 08/30/2018  . Generalized weakness 08/30/2018  . Left conjunctivitis 04/13/2018  . Asthmatic bronchitis 04/13/2018  . Rash 04/13/2018  . Acute bilateral deep vein thrombosis (DVT) of femoral veins ([place]) 01/31/2018  . Hypokalemia 01/31/2018  . Abnormal ECG 01/31/2018  . Elevated CPK 01/31/2018  . Left leg pain 01/13/2018  . Left leg swelling 01/13/2018  . Hearing  loss 01/13/2018  . Intractable pain 12/27/2017  . Arachnoiditis 12/27/2017  . Hyponatremia   . S/P lumbar laminectomy 12/23/2017  . Wheezing 12/10/2017  . Bilateral knee pain 12/10/2017  . Itching 11/22/2017  . Urinary frequency 11/22/2017  . Degenerative disc disease, lumbar 11/05/2017  . Left sided sciatica 09/04/2017  . Asthma exacerbation 08/31/2017  . Vertigo  10/30/2016  . Allergic rhinitis 10/30/2016  . Bowel incontinence 07/31/2016  . Hemorrhoids 07/31/2016  . Hematochezia 07/31/2016  . Chronic venous insufficiency 07/10/2016  . Diastolic dysfunction 28/31/5176  . Peripheral edema 07/10/2016  . Left ankle pain 06/02/2016  . Neck pain 03/10/2016  . Low back pain 03/10/2016  . Right cervical radiculopathy 11/13/2015  . Bradycardia 03/15/2015  . Arthralgia 12/18/2014  . Fever 11/27/2014  . Impaired glucose tolerance 07/18/2014  . Right lumbar radiculopathy 07/18/2014  . Encounter for well adult exam with abnormal findings 02/20/2014  . Malignant neoplasm of upper-outer quadrant of left breast in female, estrogen receptor positive ([place]) 11/08/2013  . Diverticulosis 07/09/2012  . Headache(784.0) 06/30/2012  . Anemia 06/29/2012  . Dyspnea 03/11/2012  . Cough 03/11/2012  . CKD (chronic kidney disease) stage 3, GFR 30-59 ml/min (HCC) 05/18/2011  . GASTROESOPHAGEAL REFLUX DISEASE 07/05/2008  . VITAMIN D DEFICIENCY 12/19/2007  . Morbid obesity ([place]) 12/16/2007  . Essential hypertension 12/16/2007  . DEGENERATIVE JOINT DISEASE 12/16/2007    Past Medical History:  Diagnosis Date  . Allergic rhinitis 10/30/2016  . Anemia   . Asthma   . Breast cancer of upper-outer quadrant of left female breast ([place]) 11/08/2013   ER/PR+ Her2- Left IDC   . Chronic renal insufficiency   . Chronic rhinitis   . Colon polyp   . Diastolic dysfunction 1/60/7371  . DJD (degenerative joint disease)   . Dyspnea   . Full dentures   . GERD (gastroesophageal reflux disease)   . Hearing loss   . Hypertension   . Hyponatremia   . Impaired glucose tolerance 07/18/2014  . Memory loss   . Morbid obesity ([place])   . Poor circulation   . Vertigo   . Wears glasses     Family History  Problem Relation Age of Onset  . Colon cancer Mother        in her 57's  . Stomach cancer Mother   . Lung cancer Brother        was a smoker  . Heart attack Son 64  . Healthy  Daughter    Past Surgical History:  Procedure Laterality Date  . ABDOMINAL HYSTERECTOMY    . BREAST LUMPECTOMY WITH NEEDLE LOCALIZATION AND AXILLARY SENTINEL LYMPH NODE BX Left 12/04/2013   Procedure: BREAST LUMPECTOMY WITH NEEDLE LOCALIZATION AND AXILLARY SENTINEL LYMPH NODE BX;  Surgeon: Shann Medal, MD;  Location: [facility];  Service: General;  Laterality: Left;  . CATARACT EXTRACTION  2009   rt  . COLONOSCOPY    . EYE SURGERY Bilateral    cataract surgery  . KNEE ARTHROSCOPY     both  . LUMBAR LAMINECTOMY/DECOMPRESSION MICRODISCECTOMY Left 12/23/2017   Procedure: Left Lumbar One-Two Laminectomy with microdiscectomy;  Surgeon: Eustace Moore, MD;  Location: [facility];  Service: Neurosurgery;  Laterality: Left;  Left L1-2 Laminectomy with microdiscectomy  . TONSILLECTOMY    . TOTAL KNEE ARTHROPLASTY  2002   rt  . TOTAL KNEE ARTHROPLASTY  2003   left  . VESICOVAGINAL FISTULA CLOSURE W/ TAH  1980   Social History   Social History Narrative  .  Not on file   Immunization History  Administered Date(s) Administered  . Fluad Quad(high Dose 65+) 11/27/2020  . Influenza Split 08/07/2011, 10/13/2012  . Influenza Whole 07/27/2008, 08/14/2009, 07/01/2010  . Influenza, High Dose Seasonal PF 07/10/2016, 08/10/2017, 08/30/2018, 09/08/2019  . Influenza,inj,Quad PF,6+ Mos 08/28/2013, 07/18/2014, 07/26/2015  . PFIZER(Purple Top)SARS-COV-2 Vaccination 12/11/2019, 01/02/2020  . Pneumococcal Conjugate-13 02/20/2014  . Pneumococcal Polysaccharide-23 10/26/2005  . Tdap 11/22/2017     Objective: Vital Signs: BP 113/72 (BP Location: Left Arm, Patient Position: Sitting, Cuff Size: Normal)   Pulse 87   Resp 16   Ht $R'5\' 1"'BP$  (1.549 m)   Wt 153 lb 12.8 oz (69.8 kg)   BMI 29.06 kg/m    Physical Exam Vitals and nursing note reviewed.  Constitutional:      Appearance: She is well-developed and well-nourished.  HENT:     Head: Normocephalic and atraumatic.  Eyes:     Extraocular  Movements: EOM normal.     Conjunctiva/sclera: Conjunctivae normal.  Cardiovascular:     Rate and Rhythm: Normal rate and regular rhythm.     Pulses: Intact distal pulses.     Heart sounds: Normal heart sounds.  Pulmonary:     Effort: Pulmonary effort is normal.     Breath sounds: Normal breath sounds.  Abdominal:     General: Bowel sounds are normal.     Palpations: Abdomen is soft.  Musculoskeletal:     Cervical back: Normal range of motion.  Lymphadenopathy:     Cervical: No cervical adenopathy.  Skin:    General: Skin is warm and dry.     Capillary Refill: Capillary refill takes less than 2 seconds.  Neurological:     Mental Status: She is alert and oriented to person, place, and time.  Psychiatric:        Mood and Affect: Mood and affect normal.        Behavior: Behavior normal.      Musculoskeletal Exam: Good ROM in her hands and wrists bilaterally. She has thickening of her MCP joints in her 2nd fingers bilaterally. No signs of synovitis in her MCPs, PIPs, or DIPs joints bilaterally.  PIP and DIP prominence was noted.  She has  subluxation in her left ankle and pes planus.  She has PIP and DIP thickening in her feet and overcrowding of the toes.  No synovitis was noted over MTP joints.   CDAI Exam: CDAI Score: - Patient Global: -; Provider Global: - Swollen: -; Tender: - Joint Exam 12/19/2020   No joint exam has been documented for this visit   There is currently no information documented on the homunculus. Go to the Rheumatology activity and complete the homunculus joint exam.  Investigation: No additional findings.  Imaging: No results found.  Recent Labs: Lab Results  Component Value Date   WBC 10.7 (H) 08/01/2020   HGB 10.6 (L) 08/01/2020   PLT 186.0 08/01/2020   NA 134 (L) 08/01/2020   K 3.9 08/01/2020   CL 93 (L) 08/01/2020   CO2 32 08/01/2020   GLUCOSE 146 (H) 08/01/2020   BUN 39 (H) 08/01/2020   CREATININE 1.74 (H) 08/01/2020   BILITOT 0.4  08/01/2020   ALKPHOS 54 08/01/2020   AST 23 08/01/2020   ALT 27 08/01/2020   PROT 6.5 08/01/2020   ALBUMIN 3.7 08/01/2020   CALCIUM 9.8 08/01/2020   GFRAA 44 (L) 10/25/2018    Speciality Comments: No specialty comments available.  Procedures:  No procedures performed Allergies: Hydrocodone, Tizanidine,  and Iron   Assessment / Plan:     Visit Diagnoses: Chondrocalcinosis -she continues to have some chronic pain and discomfort due to underlying osteoarthritis.  She had no synovitis on examination.  She has no swollen joints.  She has been taking colchicine 0.3 mg p.o. daily.    Primary osteoarthritis of both hands - The clinical and radiographic findings are consistent with osteoarthritis.  Primary osteoarthritis of both shoulders-she has discomfort in both shoulders.  Status post total knee replacement, bilateral-doing well but she still uses walker for mobility.  Pain in both feet - With subtalar collapse consistent with inflammatory arthritis, most likely due to chronic chondrocalcinosis.  She had no synovitis on examination  Long-term use of systemic steroids-she has been on prednisone for a long time.  She is currently on prednisone 7 mg p.o. daily.  She states she is experiencing difficulty tapering prednisone.  I am uncertain for how many years she was on prednisone prior to seeing me.  I will refer her to endocrinology to rule out adrenal insufficiency.  I do not see a DEXA scan in the system.  Have advised her to discuss with the PCP if she had a DEXA before.  DDD (degenerative disc disease), cervical-she has stiffness range of motion of the cervical spine.  Degenerative disc disease, lumbar - Status post laminectomy  Myalgia-she continues to have some muscle pain but no increased muscle weakness.  Malignant neoplasm of upper-outer quadrant of left breast in female, estrogen receptor positive ([place])  Stage 3 chronic kidney disease, unspecified whether stage 3a or 3b CKD  (HCC)-her GFR is low but is stable.  Acute bilateral deep vein thrombosis (DVT) of femoral veins (HCC)  Essential hypertension-blood pressure was normal today  Diverticulosis  Mild intermittent asthmatic bronchitis with acute exacerbation  Chronic venous insufficiency  History of gastroesophageal reflux (GERD)  Medication monitoring encounter - Plan: CBC with Differential/Platelet, COMPLETE METABOLIC PANEL WITH GFR  Orders: Orders Placed This Encounter  Procedures  . CBC with Differential/Platelet  . COMPLETE METABOLIC PANEL WITH GFR  . Ambulatory referral to Endocrinology   No orders of the defined types were placed in this encounter.    Follow-Up Instructions: Return in about 6 months (around 06/18/2021) for CPPD, OA.   Bo Merino, MD  Note - This record has been created using Editor, commissioning.  Chart creation errors have been sought, but may not always  have been located. Such creation errors do not reflect on  the standard of medical care.

## 2020-12-05 NOTE — Telephone Encounter (Signed)
[name] w/ [employer] called and was wondering if a PA for home health care was sent by a fax. If so the fax number that it needs to be sent to [phone]. Please advise

## 2020-12-12 ENCOUNTER — Ambulatory Visit: Payer: Medicare HMO | Admitting: Rheumatology

## 2020-12-16 ENCOUNTER — Telehealth: Payer: Self-pay

## 2020-12-16 NOTE — Telephone Encounter (Signed)
Patient called stating she has appointment with Dr. Estanislado Pandy on Thursday, 12/19/20.  Patient states she was told she needed to have labwork 2 days prior to her appointment.  Patient states she no longer has a caregiver and her daughter who works full-time will be bringing her to her appointment.  Is it possible for patient to have her labwork the same day as her appointment so her daughter doesn't have to take another day off of work.  Please advise

## 2020-12-16 NOTE — Telephone Encounter (Signed)
I called patient, patient will have labs drawn at her visit 12/19/20, patient has transportation issues and cannot have labs drawn before appt.

## 2020-12-17 ENCOUNTER — Telehealth: Payer: Self-pay | Admitting: Internal Medicine

## 2020-12-17 NOTE — Telephone Encounter (Signed)
Kelly from [facility] has called in regards to the patient still needing a medical necessity letter.  The following request was mentioned on 1.31.22 and the appointment was set for 2.2.22 with the doctor in order to have office visit documentation.  They have still not received anything back about it, Claiborne Billings just wants an update to tell the patient.  Claiborne Billings- [phone]

## 2020-12-17 NOTE — Telephone Encounter (Signed)
No.  This is a medicare scam where they got the patient to agree to a visit to order expensive genetic testing  Please ask them to stop calling.  B/c it is not going to happen  Pt does NOT want genetic testing and I do not want it either.  Please stop

## 2020-12-19 ENCOUNTER — Ambulatory Visit: Payer: Medicare HMO | Admitting: Rheumatology

## 2020-12-19 ENCOUNTER — Other Ambulatory Visit: Payer: Self-pay

## 2020-12-19 ENCOUNTER — Encounter: Payer: Self-pay | Admitting: Rheumatology

## 2020-12-19 VITALS — BP 113/72 | HR 87 | Resp 16 | Ht 61.0 in | Wt 153.8 lb

## 2020-12-19 DIAGNOSIS — M79672 Pain in left foot: Secondary | ICD-10-CM

## 2020-12-19 DIAGNOSIS — M79671 Pain in right foot: Secondary | ICD-10-CM | POA: Diagnosis not present

## 2020-12-19 DIAGNOSIS — M19042 Primary osteoarthritis, left hand: Secondary | ICD-10-CM

## 2020-12-19 DIAGNOSIS — M19041 Primary osteoarthritis, right hand: Secondary | ICD-10-CM | POA: Diagnosis not present

## 2020-12-19 DIAGNOSIS — Z96653 Presence of artificial knee joint, bilateral: Secondary | ICD-10-CM | POA: Diagnosis not present

## 2020-12-19 DIAGNOSIS — I872 Venous insufficiency (chronic) (peripheral): Secondary | ICD-10-CM

## 2020-12-19 DIAGNOSIS — Z5181 Encounter for therapeutic drug level monitoring: Secondary | ICD-10-CM

## 2020-12-19 DIAGNOSIS — M19011 Primary osteoarthritis, right shoulder: Secondary | ICD-10-CM | POA: Diagnosis not present

## 2020-12-19 DIAGNOSIS — K5790 Diverticulosis of intestine, part unspecified, without perforation or abscess without bleeding: Secondary | ICD-10-CM

## 2020-12-19 DIAGNOSIS — M791 Myalgia, unspecified site: Secondary | ICD-10-CM | POA: Diagnosis not present

## 2020-12-19 DIAGNOSIS — Z7952 Long term (current) use of systemic steroids: Secondary | ICD-10-CM

## 2020-12-19 DIAGNOSIS — M5136 Other intervertebral disc degeneration, lumbar region: Secondary | ICD-10-CM | POA: Diagnosis not present

## 2020-12-19 DIAGNOSIS — M1120 Other chondrocalcinosis, unspecified site: Secondary | ICD-10-CM | POA: Diagnosis not present

## 2020-12-19 DIAGNOSIS — C50412 Malignant neoplasm of upper-outer quadrant of left female breast: Secondary | ICD-10-CM

## 2020-12-19 DIAGNOSIS — N183 Chronic kidney disease, stage 3 unspecified: Secondary | ICD-10-CM

## 2020-12-19 DIAGNOSIS — Z170 Estrogen receptor positive status [ER+]: Secondary | ICD-10-CM

## 2020-12-19 DIAGNOSIS — M19012 Primary osteoarthritis, left shoulder: Secondary | ICD-10-CM

## 2020-12-19 DIAGNOSIS — I82413 Acute embolism and thrombosis of femoral vein, bilateral: Secondary | ICD-10-CM

## 2020-12-19 DIAGNOSIS — J4521 Mild intermittent asthma with (acute) exacerbation: Secondary | ICD-10-CM

## 2020-12-19 DIAGNOSIS — M5030 Other cervical disc degeneration, unspecified cervical region: Secondary | ICD-10-CM | POA: Diagnosis not present

## 2020-12-19 DIAGNOSIS — Z8719 Personal history of other diseases of the digestive system: Secondary | ICD-10-CM

## 2020-12-19 DIAGNOSIS — I10 Essential (primary) hypertension: Secondary | ICD-10-CM

## 2020-12-20 NOTE — Telephone Encounter (Signed)
[name] notified to stop the medicare scam as well as stop calling and faxing this office per the provider!

## 2020-12-21 ENCOUNTER — Other Ambulatory Visit: Payer: Self-pay | Admitting: Internal Medicine

## 2020-12-23 ENCOUNTER — Other Ambulatory Visit: Payer: Self-pay | Admitting: Rheumatology

## 2020-12-23 MED ORDER — PREDNISONE 1 MG PO TABS: 1.0000 mg | ORAL_TABLET | Freq: Every day | ORAL | 0 refills | Status: DC

## 2020-12-23 MED ORDER — PREDNISONE 5 MG PO TABS: 5.0000 mg | ORAL_TABLET | Freq: Every day | ORAL | 0 refills | Status: DC

## 2020-12-23 NOTE — Telephone Encounter (Signed)
Last Visit: 12/19/2020  Next Visit: message sent to front desk to schedule appt, Return in about 6 months (around 06/18/2021) for CPPD, OA  Current Dose per office note on 12/19/2020, prednisone 7 mg p.o. daily Dx: Long-term use of systemic steroids and   Chondrocalcinosis  Last Fill: 05/27/2020   Okay to refill Prednisone?

## 2020-12-23 NOTE — Telephone Encounter (Signed)
Patient calling to request refill on Prednisone 5 mg sent to [facility] on [address].

## 2020-12-24 ENCOUNTER — Telehealth: Payer: Self-pay

## 2020-12-24 NOTE — Telephone Encounter (Signed)
Spoke with [place] and she is not able to do PA, Holland Falling is calling requesting a PA so the patient can receive home health services. If you have any questions about what needs to be done [phone]

## 2020-12-24 NOTE — Telephone Encounter (Signed)
LMOM for patient to call and schedule 6 month follow-up appointment ?

## 2020-12-24 NOTE — Telephone Encounter (Signed)
Patient called stating she called the [facility] this morning to check on her prescription refills of Prednisone 5 mg & 1 mg tablets.  Patient states the pharmacist has not received prescription from Dr. Estanislado Pandy and was told to contact our office.

## 2020-12-24 NOTE — Telephone Encounter (Signed)
Contacted [facility] and spoke with Marjory Lies. He states they have received the Prednisone prescriptions and they are ready for pick up. Left message for patient to advise.

## 2020-12-25 ENCOUNTER — Other Ambulatory Visit: Payer: Self-pay

## 2020-12-25 ENCOUNTER — Other Ambulatory Visit: Payer: Self-pay | Admitting: Internal Medicine

## 2020-12-25 ENCOUNTER — Telehealth: Payer: Self-pay | Admitting: Internal Medicine

## 2020-12-25 ENCOUNTER — Telehealth: Payer: Self-pay

## 2020-12-25 DIAGNOSIS — R269 Unspecified abnormalities of gait and mobility: Secondary | ICD-10-CM

## 2020-12-25 DIAGNOSIS — G8929 Other chronic pain: Secondary | ICD-10-CM

## 2020-12-25 DIAGNOSIS — Z5181 Encounter for therapeutic drug level monitoring: Secondary | ICD-10-CM

## 2020-12-25 DIAGNOSIS — M069 Rheumatoid arthritis, unspecified: Secondary | ICD-10-CM

## 2020-12-25 DIAGNOSIS — M25511 Pain in right shoulder: Secondary | ICD-10-CM

## 2020-12-25 DIAGNOSIS — G894 Chronic pain syndrome: Secondary | ICD-10-CM

## 2020-12-25 DIAGNOSIS — R531 Weakness: Secondary | ICD-10-CM

## 2020-12-25 DIAGNOSIS — I10 Essential (primary) hypertension: Secondary | ICD-10-CM

## 2020-12-25 LAB — CBC WITH DIFFERENTIAL/PLATELET
Absolute Monocytes: 667 cells/uL (ref 200–950)
Basophils Absolute: 45 cells/uL (ref 0–200)
Basophils Relative: 0.4 %
Eosinophils Absolute: 45 cells/uL (ref 15–500)
Eosinophils Relative: 0.4 %
HCT: 32.3 % — ABNORMAL LOW (ref 35.0–45.0)
Hemoglobin: 10.9 g/dL — ABNORMAL LOW (ref 11.7–15.5)
Lymphs Abs: 825 cells/uL — ABNORMAL LOW (ref 850–3900)
MCH: 33.6 pg — ABNORMAL HIGH (ref 27.0–33.0)
MCHC: 33.7 g/dL (ref 32.0–36.0)
MCV: 99.7 fL (ref 80.0–100.0)
MPV: 9.3 fL (ref 7.5–12.5)
Monocytes Relative: 5.9 %
Neutro Abs: 9718 cells/uL — ABNORMAL HIGH (ref 1500–7800)
Neutrophils Relative %: 86 %
Platelets: 205 10*3/uL (ref 140–400)
RBC: 3.24 10*6/uL — ABNORMAL LOW (ref 3.80–5.10)
RDW: 12.8 % (ref 11.0–15.0)
Total Lymphocyte: 7.3 %
WBC: 11.3 10*3/uL — ABNORMAL HIGH (ref 3.8–10.8)

## 2020-12-25 LAB — COMPLETE METABOLIC PANEL WITH GFR
AG Ratio: 1.8 (calc) (ref 1.0–2.5)
ALT: 23 U/L (ref 6–29)
AST: 21 U/L (ref 10–35)
Albumin: 3.9 g/dL (ref 3.6–5.1)
Alkaline phosphatase (APISO): 52 U/L (ref 37–153)
BUN/Creatinine Ratio: 31 (calc) — ABNORMAL HIGH (ref 6–22)
BUN: 65 mg/dL — ABNORMAL HIGH (ref 7–25)
CO2: 28 mmol/L (ref 20–32)
Calcium: 9.4 mg/dL (ref 8.6–10.4)
Chloride: 97 mmol/L — ABNORMAL LOW (ref 98–110)
Creat: 2.1 mg/dL — ABNORMAL HIGH (ref 0.60–0.88)
GFR, Est African American: 25 mL/min/{1.73_m2} — ABNORMAL LOW (ref >=60)
GFR, Est Non African American: 21 mL/min/{1.73_m2} — ABNORMAL LOW (ref >=60)
Globulin: 2.2 g/dL (calc) (ref 1.9–3.7)
Glucose, Bld: 133 mg/dL — ABNORMAL HIGH (ref 65–99)
Potassium: 4.4 mmol/L (ref 3.5–5.3)
Sodium: 137 mmol/L (ref 135–146)
Total Bilirubin: 0.3 mg/dL (ref 0.2–1.2)
Total Protein: 6.1 g/dL (ref 6.1–8.1)

## 2020-12-25 NOTE — Telephone Encounter (Addendum)
Spoke with [employer] they cant explain what they were needing other than a cpt code and doctors information such as tax id [email or web address] of the many scam faxes we have received for this patient.

## 2020-12-25 NOTE — Telephone Encounter (Signed)
Follow-up with the referral team and Dr.John there situation now resolve correct referrals now submitted

## 2020-12-25 NOTE — Telephone Encounter (Signed)
Patient called and was wondering if Holland Falling has been contacted in regards to home health care. She can be reached at [phone]. Please advise

## 2020-12-26 ENCOUNTER — Telehealth: Payer: Self-pay | Admitting: *Deleted

## 2020-12-26 DIAGNOSIS — R944 Abnormal results of kidney function studies: Secondary | ICD-10-CM

## 2020-12-26 DIAGNOSIS — N183 Chronic kidney disease, stage 3 unspecified: Secondary | ICD-10-CM

## 2020-12-26 DIAGNOSIS — R7989 Other specified abnormal findings of blood chemistry: Secondary | ICD-10-CM

## 2020-12-26 NOTE — Telephone Encounter (Signed)
-----   Message from Bo Merino, MD sent at 12/26/2020 12:18 PM EST ----- Glucose is mildly elevated.  GFR is low.  Anemia is stable.  Please forward labs to her PCP.  If patient is not seeing a nephrologist she should get a nephrology referral.

## 2020-12-26 NOTE — Progress Notes (Signed)
Glucose is mildly elevated.  GFR is low.  Anemia is stable.  Please forward labs to her PCP.  If patient is not seeing a nephrologist she should get a nephrology referral.

## 2020-12-27 DIAGNOSIS — M5416 Radiculopathy, lumbar region: Secondary | ICD-10-CM | POA: Diagnosis not present

## 2020-12-27 DIAGNOSIS — G8929 Other chronic pain: Secondary | ICD-10-CM | POA: Diagnosis not present

## 2020-12-27 DIAGNOSIS — J309 Allergic rhinitis, unspecified: Secondary | ICD-10-CM | POA: Diagnosis not present

## 2020-12-27 DIAGNOSIS — I129 Hypertensive chronic kidney disease with stage 1 through stage 4 chronic kidney disease, or unspecified chronic kidney disease: Secondary | ICD-10-CM | POA: Diagnosis not present

## 2020-12-27 DIAGNOSIS — J45909 Unspecified asthma, uncomplicated: Secondary | ICD-10-CM | POA: Diagnosis not present

## 2020-12-27 DIAGNOSIS — R2689 Other abnormalities of gait and mobility: Secondary | ICD-10-CM | POA: Diagnosis not present

## 2020-12-27 DIAGNOSIS — D649 Anemia, unspecified: Secondary | ICD-10-CM | POA: Diagnosis not present

## 2020-12-27 DIAGNOSIS — M1120 Other chondrocalcinosis, unspecified site: Secondary | ICD-10-CM | POA: Diagnosis not present

## 2020-12-27 DIAGNOSIS — N1831 Chronic kidney disease, stage 3a: Secondary | ICD-10-CM | POA: Diagnosis not present

## 2020-12-27 DIAGNOSIS — M150 Primary generalized (osteo)arthritis: Secondary | ICD-10-CM | POA: Diagnosis not present

## 2020-12-31 DIAGNOSIS — N1831 Chronic kidney disease, stage 3a: Secondary | ICD-10-CM | POA: Diagnosis not present

## 2020-12-31 DIAGNOSIS — M1120 Other chondrocalcinosis, unspecified site: Secondary | ICD-10-CM | POA: Diagnosis not present

## 2020-12-31 DIAGNOSIS — I129 Hypertensive chronic kidney disease with stage 1 through stage 4 chronic kidney disease, or unspecified chronic kidney disease: Secondary | ICD-10-CM | POA: Diagnosis not present

## 2020-12-31 DIAGNOSIS — J309 Allergic rhinitis, unspecified: Secondary | ICD-10-CM | POA: Diagnosis not present

## 2020-12-31 DIAGNOSIS — M5416 Radiculopathy, lumbar region: Secondary | ICD-10-CM | POA: Diagnosis not present

## 2020-12-31 DIAGNOSIS — J45909 Unspecified asthma, uncomplicated: Secondary | ICD-10-CM | POA: Diagnosis not present

## 2020-12-31 DIAGNOSIS — R2689 Other abnormalities of gait and mobility: Secondary | ICD-10-CM | POA: Diagnosis not present

## 2020-12-31 DIAGNOSIS — D649 Anemia, unspecified: Secondary | ICD-10-CM | POA: Diagnosis not present

## 2020-12-31 DIAGNOSIS — M150 Primary generalized (osteo)arthritis: Secondary | ICD-10-CM | POA: Diagnosis not present

## 2020-12-31 DIAGNOSIS — G8929 Other chronic pain: Secondary | ICD-10-CM | POA: Diagnosis not present

## 2021-01-02 DIAGNOSIS — M5416 Radiculopathy, lumbar region: Secondary | ICD-10-CM | POA: Diagnosis not present

## 2021-01-02 DIAGNOSIS — R2689 Other abnormalities of gait and mobility: Secondary | ICD-10-CM | POA: Diagnosis not present

## 2021-01-02 DIAGNOSIS — J45909 Unspecified asthma, uncomplicated: Secondary | ICD-10-CM | POA: Diagnosis not present

## 2021-01-02 DIAGNOSIS — I129 Hypertensive chronic kidney disease with stage 1 through stage 4 chronic kidney disease, or unspecified chronic kidney disease: Secondary | ICD-10-CM | POA: Diagnosis not present

## 2021-01-02 DIAGNOSIS — D649 Anemia, unspecified: Secondary | ICD-10-CM | POA: Diagnosis not present

## 2021-01-02 DIAGNOSIS — G8929 Other chronic pain: Secondary | ICD-10-CM | POA: Diagnosis not present

## 2021-01-02 DIAGNOSIS — N1831 Chronic kidney disease, stage 3a: Secondary | ICD-10-CM | POA: Diagnosis not present

## 2021-01-02 DIAGNOSIS — M1120 Other chondrocalcinosis, unspecified site: Secondary | ICD-10-CM | POA: Diagnosis not present

## 2021-01-02 DIAGNOSIS — J309 Allergic rhinitis, unspecified: Secondary | ICD-10-CM | POA: Diagnosis not present

## 2021-01-02 DIAGNOSIS — M150 Primary generalized (osteo)arthritis: Secondary | ICD-10-CM | POA: Diagnosis not present

## 2021-01-07 DIAGNOSIS — G8929 Other chronic pain: Secondary | ICD-10-CM | POA: Diagnosis not present

## 2021-01-07 DIAGNOSIS — D649 Anemia, unspecified: Secondary | ICD-10-CM | POA: Diagnosis not present

## 2021-01-07 DIAGNOSIS — M5416 Radiculopathy, lumbar region: Secondary | ICD-10-CM | POA: Diagnosis not present

## 2021-01-07 DIAGNOSIS — N1831 Chronic kidney disease, stage 3a: Secondary | ICD-10-CM | POA: Diagnosis not present

## 2021-01-07 DIAGNOSIS — R2689 Other abnormalities of gait and mobility: Secondary | ICD-10-CM | POA: Diagnosis not present

## 2021-01-07 DIAGNOSIS — I129 Hypertensive chronic kidney disease with stage 1 through stage 4 chronic kidney disease, or unspecified chronic kidney disease: Secondary | ICD-10-CM | POA: Diagnosis not present

## 2021-01-07 DIAGNOSIS — M1120 Other chondrocalcinosis, unspecified site: Secondary | ICD-10-CM | POA: Diagnosis not present

## 2021-01-07 DIAGNOSIS — J309 Allergic rhinitis, unspecified: Secondary | ICD-10-CM | POA: Diagnosis not present

## 2021-01-07 DIAGNOSIS — M150 Primary generalized (osteo)arthritis: Secondary | ICD-10-CM | POA: Diagnosis not present

## 2021-01-07 DIAGNOSIS — J45909 Unspecified asthma, uncomplicated: Secondary | ICD-10-CM | POA: Diagnosis not present

## 2021-01-09 DIAGNOSIS — R82998 Other abnormal findings in urine: Secondary | ICD-10-CM | POA: Diagnosis not present

## 2021-01-09 DIAGNOSIS — R7303 Prediabetes: Secondary | ICD-10-CM | POA: Diagnosis not present

## 2021-01-09 DIAGNOSIS — E871 Hypo-osmolality and hyponatremia: Secondary | ICD-10-CM | POA: Diagnosis not present

## 2021-01-09 DIAGNOSIS — R3129 Other microscopic hematuria: Secondary | ICD-10-CM | POA: Diagnosis not present

## 2021-01-09 DIAGNOSIS — I129 Hypertensive chronic kidney disease with stage 1 through stage 4 chronic kidney disease, or unspecified chronic kidney disease: Secondary | ICD-10-CM | POA: Diagnosis not present

## 2021-01-09 DIAGNOSIS — N184 Chronic kidney disease, stage 4 (severe): Secondary | ICD-10-CM | POA: Diagnosis not present

## 2021-01-09 DIAGNOSIS — R600 Localized edema: Secondary | ICD-10-CM | POA: Diagnosis not present

## 2021-01-10 ENCOUNTER — Telehealth: Payer: Self-pay | Admitting: *Deleted

## 2021-01-10 ENCOUNTER — Telehealth: Payer: Self-pay

## 2021-01-10 DIAGNOSIS — R2689 Other abnormalities of gait and mobility: Secondary | ICD-10-CM | POA: Diagnosis not present

## 2021-01-10 DIAGNOSIS — N1831 Chronic kidney disease, stage 3a: Secondary | ICD-10-CM | POA: Diagnosis not present

## 2021-01-10 DIAGNOSIS — G8929 Other chronic pain: Secondary | ICD-10-CM | POA: Diagnosis not present

## 2021-01-10 DIAGNOSIS — D649 Anemia, unspecified: Secondary | ICD-10-CM | POA: Diagnosis not present

## 2021-01-10 DIAGNOSIS — M1120 Other chondrocalcinosis, unspecified site: Secondary | ICD-10-CM | POA: Diagnosis not present

## 2021-01-10 DIAGNOSIS — J309 Allergic rhinitis, unspecified: Secondary | ICD-10-CM | POA: Diagnosis not present

## 2021-01-10 DIAGNOSIS — M150 Primary generalized (osteo)arthritis: Secondary | ICD-10-CM | POA: Diagnosis not present

## 2021-01-10 DIAGNOSIS — I129 Hypertensive chronic kidney disease with stage 1 through stage 4 chronic kidney disease, or unspecified chronic kidney disease: Secondary | ICD-10-CM | POA: Diagnosis not present

## 2021-01-10 DIAGNOSIS — M5416 Radiculopathy, lumbar region: Secondary | ICD-10-CM | POA: Diagnosis not present

## 2021-01-10 DIAGNOSIS — J45909 Unspecified asthma, uncomplicated: Secondary | ICD-10-CM | POA: Diagnosis not present

## 2021-01-10 NOTE — Telephone Encounter (Signed)
LMOM for patient to return call.

## 2021-01-10 NOTE — Telephone Encounter (Signed)
Patient called stating she was returning a call to the office.  Patient states she "cleared out her answering machine before she was able to listen to the message."

## 2021-01-10 NOTE — Telephone Encounter (Addendum)
I called patient, patient verbalized understanding, colchicine discontinued in patient's chart.  ----- Message from Bo Merino, MD sent at 01/09/2021  5:19 PM EDT ----- Please advise patient to discontinue colchicine.  She is on prednisone.  This should prevent her from having pseudogout flares.  I have left a message on the answering machine for patient to call back. Bo Merino, MD  ----- Message ----- From: Claudia Desanctis, MD Sent: 01/09/2021   4:35 PM EDT To: Bo Merino, MD  Good afternoon,   Thank you for your referral.  I saw Ms. Boettger today - her creatinine is improved on repeat to 1.62 but BUN is still elevated.  Is she able to taper down on or transition off of the colchicine?  I'm stopping her amlodipine to see if that helps with some painful leg swelling.     Thank you,  Claudia Desanctis

## 2021-01-13 ENCOUNTER — Other Ambulatory Visit: Payer: Self-pay | Admitting: Nephrology

## 2021-01-13 ENCOUNTER — Telehealth: Payer: Self-pay | Admitting: Internal Medicine

## 2021-01-13 DIAGNOSIS — R2689 Other abnormalities of gait and mobility: Secondary | ICD-10-CM | POA: Diagnosis not present

## 2021-01-13 DIAGNOSIS — J309 Allergic rhinitis, unspecified: Secondary | ICD-10-CM | POA: Diagnosis not present

## 2021-01-13 DIAGNOSIS — M150 Primary generalized (osteo)arthritis: Secondary | ICD-10-CM | POA: Diagnosis not present

## 2021-01-13 DIAGNOSIS — N184 Chronic kidney disease, stage 4 (severe): Secondary | ICD-10-CM

## 2021-01-13 DIAGNOSIS — D649 Anemia, unspecified: Secondary | ICD-10-CM | POA: Diagnosis not present

## 2021-01-13 DIAGNOSIS — M5416 Radiculopathy, lumbar region: Secondary | ICD-10-CM | POA: Diagnosis not present

## 2021-01-13 DIAGNOSIS — J45909 Unspecified asthma, uncomplicated: Secondary | ICD-10-CM | POA: Diagnosis not present

## 2021-01-13 DIAGNOSIS — I129 Hypertensive chronic kidney disease with stage 1 through stage 4 chronic kidney disease, or unspecified chronic kidney disease: Secondary | ICD-10-CM | POA: Diagnosis not present

## 2021-01-13 DIAGNOSIS — N1831 Chronic kidney disease, stage 3a: Secondary | ICD-10-CM | POA: Diagnosis not present

## 2021-01-13 DIAGNOSIS — G8929 Other chronic pain: Secondary | ICD-10-CM | POA: Diagnosis not present

## 2021-01-13 DIAGNOSIS — M1120 Other chondrocalcinosis, unspecified site: Secondary | ICD-10-CM | POA: Diagnosis not present

## 2021-01-13 NOTE — Telephone Encounter (Signed)
Charri w/ [facility] is requesting verbals for an OT eval for left shoulder pain. Please advise    Phone: [phone]

## 2021-01-13 NOTE — Telephone Encounter (Signed)
Ok for verbals 

## 2021-01-13 NOTE — Telephone Encounter (Signed)
LDVM on confidential VM that PCP approved order for OT eval for left shoulder pain.

## 2021-01-15 ENCOUNTER — Telehealth: Payer: Self-pay | Admitting: *Deleted

## 2021-01-15 DIAGNOSIS — J45909 Unspecified asthma, uncomplicated: Secondary | ICD-10-CM | POA: Diagnosis not present

## 2021-01-15 DIAGNOSIS — J309 Allergic rhinitis, unspecified: Secondary | ICD-10-CM | POA: Diagnosis not present

## 2021-01-15 DIAGNOSIS — R2689 Other abnormalities of gait and mobility: Secondary | ICD-10-CM | POA: Diagnosis not present

## 2021-01-15 DIAGNOSIS — N1831 Chronic kidney disease, stage 3a: Secondary | ICD-10-CM | POA: Diagnosis not present

## 2021-01-15 DIAGNOSIS — M1120 Other chondrocalcinosis, unspecified site: Secondary | ICD-10-CM | POA: Diagnosis not present

## 2021-01-15 DIAGNOSIS — G8929 Other chronic pain: Secondary | ICD-10-CM | POA: Diagnosis not present

## 2021-01-15 DIAGNOSIS — M150 Primary generalized (osteo)arthritis: Secondary | ICD-10-CM | POA: Diagnosis not present

## 2021-01-15 DIAGNOSIS — D649 Anemia, unspecified: Secondary | ICD-10-CM | POA: Diagnosis not present

## 2021-01-15 DIAGNOSIS — I129 Hypertensive chronic kidney disease with stage 1 through stage 4 chronic kidney disease, or unspecified chronic kidney disease: Secondary | ICD-10-CM | POA: Diagnosis not present

## 2021-01-15 DIAGNOSIS — M5416 Radiculopathy, lumbar region: Secondary | ICD-10-CM | POA: Diagnosis not present

## 2021-01-15 NOTE — Telephone Encounter (Signed)
Labs received from: [facility] on: 01/09/2021 Reviewed by: Hazel Sams, PA-C  Labs drawn: Urinalysis, Renal function panel, PR/CR, ANA, ANCA, Complements C3 C4  Results: BUN    51 Creatinine  1.62 eGFR   31 BUN/Creatinine Ratio  31  Discontinue colchinine. She will continue prednisone to prevent flares.

## 2021-01-20 DIAGNOSIS — Z1231 Encounter for screening mammogram for malignant neoplasm of breast: Secondary | ICD-10-CM | POA: Diagnosis not present

## 2021-01-21 ENCOUNTER — Telehealth: Payer: Self-pay | Admitting: Internal Medicine

## 2021-01-21 DIAGNOSIS — I129 Hypertensive chronic kidney disease with stage 1 through stage 4 chronic kidney disease, or unspecified chronic kidney disease: Secondary | ICD-10-CM | POA: Diagnosis not present

## 2021-01-21 DIAGNOSIS — J309 Allergic rhinitis, unspecified: Secondary | ICD-10-CM | POA: Diagnosis not present

## 2021-01-21 DIAGNOSIS — M1120 Other chondrocalcinosis, unspecified site: Secondary | ICD-10-CM | POA: Diagnosis not present

## 2021-01-21 DIAGNOSIS — G8929 Other chronic pain: Secondary | ICD-10-CM | POA: Diagnosis not present

## 2021-01-21 DIAGNOSIS — M5416 Radiculopathy, lumbar region: Secondary | ICD-10-CM | POA: Diagnosis not present

## 2021-01-21 DIAGNOSIS — N1831 Chronic kidney disease, stage 3a: Secondary | ICD-10-CM | POA: Diagnosis not present

## 2021-01-21 DIAGNOSIS — J45909 Unspecified asthma, uncomplicated: Secondary | ICD-10-CM | POA: Diagnosis not present

## 2021-01-21 DIAGNOSIS — M150 Primary generalized (osteo)arthritis: Secondary | ICD-10-CM | POA: Diagnosis not present

## 2021-01-21 DIAGNOSIS — R2689 Other abnormalities of gait and mobility: Secondary | ICD-10-CM | POA: Diagnosis not present

## 2021-01-21 DIAGNOSIS — D649 Anemia, unspecified: Secondary | ICD-10-CM | POA: Diagnosis not present

## 2021-01-21 NOTE — Telephone Encounter (Signed)
[name] is calling back, states the right middle toe or the third toe is darker than the other toes on the right foot

## 2021-01-21 NOTE — Telephone Encounter (Signed)
Charri from [facility] home health calling, states that the patient is complaining of a lot of left lower leg and foot pain, wondering if she could get checked for a possible blood clot. She wasn't sure if there was an agency we could send to the home to check for that.  Jerl [phone]

## 2021-01-21 NOTE — Telephone Encounter (Signed)
Needs ov if possible

## 2021-01-22 NOTE — Telephone Encounter (Signed)
Patient made appointment for 3/31 at 9:20 with Dr. Jenny Reichmann. Patient states that she will contact office if her daughter is unable to bring her to the appointment.

## 2021-01-23 ENCOUNTER — Ambulatory Visit: Payer: Medicare HMO | Admitting: Internal Medicine

## 2021-01-24 ENCOUNTER — Ambulatory Visit (INDEPENDENT_AMBULATORY_CARE_PROVIDER_SITE_OTHER): Payer: Medicare HMO

## 2021-01-24 ENCOUNTER — Telehealth: Payer: Self-pay | Admitting: Rheumatology

## 2021-01-24 ENCOUNTER — Ambulatory Visit (INDEPENDENT_AMBULATORY_CARE_PROVIDER_SITE_OTHER): Payer: Medicare HMO | Admitting: Internal Medicine

## 2021-01-24 ENCOUNTER — Other Ambulatory Visit: Payer: Self-pay

## 2021-01-24 VITALS — BP 132/80 | HR 93 | Temp 98.4°F | Ht 61.0 in | Wt 158.0 lb

## 2021-01-24 DIAGNOSIS — E559 Vitamin D deficiency, unspecified: Secondary | ICD-10-CM

## 2021-01-24 DIAGNOSIS — M79674 Pain in right toe(s): Secondary | ICD-10-CM

## 2021-01-24 DIAGNOSIS — M7989 Other specified soft tissue disorders: Secondary | ICD-10-CM | POA: Diagnosis not present

## 2021-01-24 DIAGNOSIS — E538 Deficiency of other specified B group vitamins: Secondary | ICD-10-CM

## 2021-01-24 DIAGNOSIS — I10 Essential (primary) hypertension: Secondary | ICD-10-CM

## 2021-01-24 DIAGNOSIS — L609 Nail disorder, unspecified: Secondary | ICD-10-CM | POA: Insufficient documentation

## 2021-01-24 DIAGNOSIS — R609 Edema, unspecified: Secondary | ICD-10-CM | POA: Diagnosis not present

## 2021-01-24 DIAGNOSIS — Z0001 Encounter for general adult medical examination with abnormal findings: Secondary | ICD-10-CM

## 2021-01-24 DIAGNOSIS — N184 Chronic kidney disease, stage 4 (severe): Secondary | ICD-10-CM | POA: Diagnosis not present

## 2021-01-24 NOTE — Patient Instructions (Signed)
Please restart the furosemide (lasix) once per day as you were taking previously  Please remember to see your podiatry foot doctor as you mentioned  Please continue all other medications as before, and refills have been done if requested.  Please have the pharmacy call with any other refills you may need.  Please continue your efforts at being more active, low cholesterol diet, and weight control.  You are otherwise up to date with prevention measures today.  Please keep your appointments with your specialists as you may have planned - kidney doctor in 1 week  Please go to the XRAY Department in the first floor for the x-ray testing  Please go to the LAB at the blood drawing area for the tests to be done  You will be contacted by phone if any changes need to be made immediately.  Otherwise, you will receive a letter about your results with an explanation, but please check with MyChart first.  Please remember to sign up for MyChart if you have not done so, as this will be important to you in the future with finding out test results, communicating by private email, and scheduling acute appointments online when needed.  Please make an Appointment to return in 6 months, or sooner if needed

## 2021-01-24 NOTE — Telephone Encounter (Signed)
I called patient, patient was taking 5 mg and 2 mg and should now taper down to 5 mg and 1 mg, patient verbalized understanding.

## 2021-01-24 NOTE — Telephone Encounter (Signed)
Patient calling in reference to Prednisone. Patient wants to verify how she is to take this. She is currently taking 5 mgs. She has two bottles stating two different directions. Please call to advise.

## 2021-01-24 NOTE — Progress Notes (Signed)
Patient ID: [name], female   DOB: [birth year], 85 y.o.   MRN: [id number]         Chief Complaint:: wellness exam and Leg Pain (Pt states she feels pain from her toes through her foot (L). Started about 6-7 wks. Pt kidney provider told her to stop taking amlodipine,lasix, and potassium.)         HPI:  [name] is a 85 y.o. female here for wellness exam; declines covid booster, o/w up to date with preventive referrals and immunizations.                        Also c/o bilat leg pain and swelling x 2 wks, gradually worsening the next day after renal MD asked her to stop amlodipine, lasix, K.  Leg has o/w been aching somewhat for several wks prior but vague and nonexertional, nonpositional.  Also has pain and swelling incidentally to 3rd toe right foot with a darkened color but no hx of trauma she can recall.  Colchicine also stopped recently per rheum.  BP at home per aide similar to today recently  Pt denies chest pain, increased sob or doe, wheezing, orthopnea, PND, palpitations, dizziness or syncope.   Pt denies polydipsia, polyuria, Denies new worsening focal neuro s/s.  She is not sure what the previous lasix dose was, maybe 40 mg.     Wt Readings from Last 3 Encounters:  01/24/21 158 lb (71.7 kg)  12/19/20 153 lb 12.8 oz (69.8 kg)  11/27/20 151 lb (68.5 kg)   BP Readings from Last 3 Encounters:  01/24/21 132/80  12/19/20 113/72  11/27/20 118/66   Immunization History  Administered Date(s) Administered  . Fluad Quad(high Dose 65+) 11/27/2020  . Influenza Split 08/07/2011, 10/13/2012  . Influenza Whole 07/27/2008, 08/14/2009, 07/01/2010  . Influenza, High Dose Seasonal PF 07/10/2016, 08/10/2017, 08/30/2018, 09/08/2019  . Influenza,inj,Quad PF,6+ Mos 08/28/2013, 07/18/2014, 07/26/2015  . PFIZER(Purple Top)SARS-COV-2 Vaccination 12/11/2019, 01/02/2020  . Pneumococcal Conjugate-13 02/20/2014  . Pneumococcal Polysaccharide-23 10/26/2005  . Tdap 11/22/2017   There  are no preventive care reminders to display for this patient.    Past Medical History:  Diagnosis Date  . Allergic rhinitis 10/30/2016  . Anemia   . Asthma   . Breast cancer of upper-outer quadrant of left female breast ([place]) 11/08/2013   ER/PR+ Her2- Left IDC   . Chronic renal insufficiency   . Chronic rhinitis   . Colon polyp   . Diastolic dysfunction 4/56/2563  . DJD (degenerative joint disease)   . Dyspnea   . Full dentures   . GERD (gastroesophageal reflux disease)   . Hearing loss   . Hypertension   . Hyponatremia   . Impaired glucose tolerance 07/18/2014  . Memory loss   . Morbid obesity ([place])   . Poor circulation   . Vertigo   . Wears glasses    Past Surgical History:  Procedure Laterality Date  . ABDOMINAL HYSTERECTOMY    . BREAST LUMPECTOMY WITH NEEDLE LOCALIZATION AND AXILLARY SENTINEL LYMPH NODE BX Left 12/04/2013   Procedure: BREAST LUMPECTOMY WITH NEEDLE LOCALIZATION AND AXILLARY SENTINEL LYMPH NODE BX;  Surgeon: Shann Medal, MD;  Location: [facility];  Service: General;  Laterality: Left;  . CATARACT EXTRACTION  2009   rt  . COLONOSCOPY    . EYE SURGERY Bilateral    cataract surgery  . KNEE ARTHROSCOPY     both  . LUMBAR LAMINECTOMY/DECOMPRESSION MICRODISCECTOMY Left 12/23/2017  Procedure: Left Lumbar One-Two Laminectomy with microdiscectomy;  Surgeon: Eustace Moore, MD;  Location: [facility];  Service: Neurosurgery;  Laterality: Left;  Left L1-2 Laminectomy with microdiscectomy  . TONSILLECTOMY    . TOTAL KNEE ARTHROPLASTY  2002   rt  . TOTAL KNEE ARTHROPLASTY  2003   left  . VESICOVAGINAL FISTULA CLOSURE W/ TAH  1980    reports that she has never smoked. She has never used smokeless tobacco. She reports that she does not drink alcohol and does not use drugs. family history includes Colon cancer in her mother; Healthy in her daughter; Heart attack (age of onset: 52) in her son; Lung cancer in her brother; Stomach cancer in her  mother. Allergies  Allergen Reactions  . Hydrocodone Itching  . Tizanidine Other (See Comments)    Dizzy and fall  . Iron Hives and Other (See Comments)    Whelps, bad constipation Other reaction(s): Unknown   Current Outpatient Medications on File Prior to Visit  Medication Sig Dispense Refill  . ADVAIR DISKUS 250-50 MCG/DOSE AEPB INHALE 1 PUFF BY MOUTH TWICE A DAY 180 each 3  . albuterol (PROVENTIL HFA;VENTOLIN HFA) 108 (90 Base) MCG/ACT inhaler Inhale 2 puffs into the lungs every 6 (six) hours as needed for wheezing or shortness of breath. 1 Inhaler 11  . aspirin 81 MG tablet Take 81 mg by mouth daily.    . Ensure (ENSURE) Take 1 Can by mouth 3 (three) times daily between meals. 237 mL 12  . FERREX 150 150 MG capsule TAKE 1 CAPSULE BY MOUTH TWICE A DAY 60 capsule 1  . gabapentin (NEURONTIN) 300 MG capsule TAKE 1 CAPSULE BY MOUTH THREE TIMES A DAY 90 capsule 5  . Incontinence Supply Disposable (DEPEND UNDERWEAR SM/MED) MISC Use as directed four times per day 120 each 5  . losartan-hydrochlorothiazide (HYZAAR) 100-25 MG tablet TAKE 1 TABLET BY MOUTH EVERY DAY 90 tablet 2  . Magnesium Gluconate 500 (27 Mg) MG TABS TAKE 1 TABLET BY MOUTH TWICE A DAY 90 tablet 2  . meclizine (ANTIVERT) 12.5 MG tablet TAKE 1 TABLET BY MOUTH THREE TIMES A DAY AS NEEDED FOR DIZZINESS 270 tablet 0  . methocarbamol (ROBAXIN) 500 MG tablet Take 1 tablet (500 mg total) by mouth every 8 (eight) hours as needed for muscle spasms. 10 tablet 0  . MOMETASONE-AMMONIUM LACTATE EX mometasone    . montelukast (SINGULAIR) 10 MG tablet TAKE 1 TABLET BY MOUTH EVERY DAY 90 tablet 3  . Multiple Vitamins-Minerals (CENTRUM SILVER PO) Take 1 tablet by mouth daily.    . predniSONE (DELTASONE) 1 MG tablet Take 1 tablet (1 mg total) by mouth daily. 180 tablet 0  . predniSONE (DELTASONE) 5 MG tablet Take 1 tablet (5 mg total) by mouth daily with breakfast. 90 tablet 0  . Simethicone (GAS-X PO) Take 1 tablet by mouth daily as needed  (for gas).     . solifenacin (VESICARE) 5 MG tablet Take 1 tablet (5 mg total) by mouth daily. 90 tablet 3  . tizanidine (ZANAFLEX) 2 MG capsule TAKE 1 CAPSULE BY MOUTH 3 TIMES A DAY AS NEEDED FOR MUSCLE SPASMS 40 capsule 1  . traMADol (ULTRAM-ER) 200 MG 24 hr tablet TAKE 1 TABLET BY MOUTH EVERY DAY 90 tablet 1  . triamcinolone (KENALOG) 0.1 % Apply 1 application topically 2 (two) times daily. 30 g 1  . triamcinolone (NASACORT AQ) 55 MCG/ACT AERO nasal inhaler Place 2 sprays into the nose daily. 1 Inhaler 12  .  amLODipine (NORVASC) 5 MG tablet TAKE 1 TABLET BY MOUTH EVERY DAY (Patient not taking: Reported on 01/24/2021) 90 tablet 3  . furosemide (LASIX) 20 MG tablet TAKE 1 TABLET BY MOUTH EVERY DAY AS NEEDED (Patient not taking: Reported on 01/24/2021) 90 tablet 1  . potassium chloride (KLOR-CON) 10 MEQ tablet TAKE 1 TABLET BY MOUTH EVERY DAY WHEN TAKING FUROSEMIDE (Patient not taking: Reported on 01/24/2021) 90 tablet 3   No current facility-administered medications on file prior to visit.        ROS:  All others reviewed and negative.  Objective        PE:  BP 132/80 (BP Location: Right Arm, Patient Position: Sitting, Cuff Size: Normal)   Pulse 93   Temp 98.4 F (36.9 C) (Oral)   Ht _0  (1.549 m)   Wt 158 lb (71.7 kg)   SpO2 95%   BMI 29.85 kg/m                 Constitutional: Pt appears in NAD               HENT: Head: NCAT.                Right Ear: External ear normal.                 Left Ear: External ear normal.                Eyes: . Pupils are equal, round, and reactive to light. Conjunctivae and EOM are normal               Nose: without d/c or deformity               Neck: Neck supple. Gross normal ROM               Cardiovascular: Normal rate and regular rhythm.                 Pulmonary/Chest: Effort normal and breath sounds without rales or wheezing.                Abd:  Soft, NT, ND, + BS, no organomegaly               Neurological: Pt is alert. At baseline  orientation, motor grossly intact               Skin: Skin is warm. No rashes, no other new lesions, LE edema - 1-2+ ;eft > right, o/w neurovasc intact.  Has several toenails long and onychomycotic.  3rd toe right foot diffuse total darkened bruised mild swelling and mild tender                Psychiatric: Pt behavior is normal without agitation   Micro: none  Cardiac tracings I have personally interpreted today:  none  Pertinent Radiological findings (summarize): none   Lab Results  Component Value Date   WBC 10.4 01/24/2021   HGB 11.2 (L) 01/24/2021   HCT 33.3 (L) 01/24/2021   PLT 221 01/24/2021   GLUCOSE 129 (H) 01/24/2021   CHOL 219 (H) 01/24/2021   TRIG 134 01/24/2021   HDL 83 01/24/2021   LDLDIRECT 109.0 08/01/2020   LDLCALC 111 (H) 01/24/2021   ALT 24 01/24/2021   AST 25 01/24/2021   NA 140 01/24/2021   K 4.6 01/24/2021   CL 101 01/24/2021   CREATININE 1.50 (H) 01/24/2021   BUN 40 (H) 01/24/2021   CO2 28  01/24/2021   TSH 0.86 01/24/2021   INR 1.35 01/17/2018   HGBA1C 5.9 08/01/2020   Assessment/Plan:  Noele ELIYANAH ELGERSMA is a 85 y.o. Black or African American [2] female with  has a past medical history of Allergic rhinitis (10/30/2016), Anemia, Asthma, Breast cancer of upper-outer quadrant of left female breast ([place]) (11/08/2013), Chronic renal insufficiency, Chronic rhinitis, Colon polyp, Diastolic dysfunction (07/23/8003), DJD (degenerative joint disease), Dyspnea, Full dentures, GERD (gastroesophageal reflux disease), Hearing loss, Hypertension, Hyponatremia, Impaired glucose tolerance (07/18/2014), Memory loss, Morbid obesity ([place]), Poor circulation, Vertigo, and Wears glasses.  Encounter for well adult exam with abnormal findings Age and sex appropriate education and counseling updated with regular exercise and diet Referrals for preventative services - none needed Immunizations addressed - declines covid booster Smoking counseling  - none needed Evidence for  depression or other mood disorder - none significant Most recent labs reviewed. I have personally reviewed and have noted: 1) the patient's medical and social history 2) The patient's current medications and supplements 3) The patient's height, weight, and BMI have been recorded in the chart   Toe pain, right Pt cannot recall trauma but has diffuse bruising and swelling persistent for several wks- for xray  Peripheral edema With hx of ckd and immediate onset after lasix stopped, now 1-2+ bilat and moderate discomfort - ok for lasix restart 20 mg daily  Nail disorder Also for podiatry referral  Essential hypertension BP Readings from Last 3 Encounters:  01/24/21 132/80  12/19/20 113/72  11/27/20 118/66   Stable, pt to continue medical treatment hyzaar, norvasc    CKD (chronic kidney disease) stage 4, GFR 15-29 ml/min ([place]) Lab Results  Component Value Date   CREATININE 1.50 (H) 01/24/2021   Stable overall, cont to avoid nephrotoxins, has renal appt next wk  Followup: Return in about 6 months (around 07/26/2021).  Cathlean Cower, MD 01/25/2021 3:21 PM [facility]

## 2021-01-25 ENCOUNTER — Encounter: Payer: Self-pay | Admitting: Internal Medicine

## 2021-01-25 LAB — CBC WITH DIFFERENTIAL/PLATELET
Absolute Monocytes: 624 {cells}/uL (ref 200–950)
Basophils Absolute: 42 {cells}/uL (ref 0–200)
Basophils Relative: 0.4 %
Eosinophils Absolute: 42 {cells}/uL (ref 15–500)
Eosinophils Relative: 0.4 %
HCT: 33.3 % — ABNORMAL LOW (ref 35.0–45.0)
Hemoglobin: 11.2 g/dL — ABNORMAL LOW (ref 11.7–15.5)
Lymphs Abs: 1186 {cells}/uL (ref 850–3900)
MCH: 33.8 pg — ABNORMAL HIGH (ref 27.0–33.0)
MCHC: 33.6 g/dL (ref 32.0–36.0)
MCV: 100.6 fL — ABNORMAL HIGH (ref 80.0–100.0)
MPV: 9.7 fL (ref 7.5–12.5)
Monocytes Relative: 6 %
Neutro Abs: 8507 {cells}/uL — ABNORMAL HIGH (ref 1500–7800)
Neutrophils Relative %: 81.8 %
Platelets: 221 Thousand/uL (ref 140–400)
RBC: 3.31 Million/uL — ABNORMAL LOW (ref 3.80–5.10)
RDW: 12.4 % (ref 11.0–15.0)
Total Lymphocyte: 11.4 %
WBC: 10.4 Thousand/uL (ref 3.8–10.8)

## 2021-01-25 LAB — BASIC METABOLIC PANEL
BUN/Creatinine Ratio: 27 (calc) — ABNORMAL HIGH (ref 6–22)
BUN: 40 mg/dL — ABNORMAL HIGH (ref 7–25)
CO2: 28 mmol/L (ref 20–32)
Calcium: 10 mg/dL (ref 8.6–10.4)
Chloride: 101 mmol/L (ref 98–110)
Creat: 1.5 mg/dL — ABNORMAL HIGH (ref 0.60–0.88)
Glucose, Bld: 129 mg/dL — ABNORMAL HIGH (ref 65–99)
Potassium: 4.6 mmol/L (ref 3.5–5.3)
Sodium: 140 mmol/L (ref 135–146)

## 2021-01-25 LAB — HEPATIC FUNCTION PANEL
AG Ratio: 1.6 (calc) (ref 1.0–2.5)
ALT: 24 U/L (ref 6–29)
AST: 25 U/L (ref 10–35)
Albumin: 3.9 g/dL (ref 3.6–5.1)
Alkaline phosphatase (APISO): 50 U/L (ref 37–153)
Bilirubin, Direct: 0.1 mg/dL (ref 0.0–0.2)
Globulin: 2.5 g/dL (calc) (ref 1.9–3.7)
Indirect Bilirubin: 0.3 mg/dL (calc) (ref 0.2–1.2)
Total Bilirubin: 0.4 mg/dL (ref 0.2–1.2)
Total Protein: 6.4 g/dL (ref 6.1–8.1)

## 2021-01-25 LAB — URINALYSIS, ROUTINE W REFLEX MICROSCOPIC
Bacteria, UA: NONE SEEN /HPF
Bilirubin Urine: NEGATIVE
Glucose, UA: NEGATIVE
Hyaline Cast: NONE SEEN /LPF
Ketones, ur: NEGATIVE
Nitrite: NEGATIVE
Protein, ur: NEGATIVE
RBC / HPF: NONE SEEN /HPF (ref 0–2)
Specific Gravity, Urine: 1.007 (ref 1.001–1.03)
Squamous Epithelial / HPF: NONE SEEN /HPF (ref <=5)
WBC, UA: NONE SEEN /HPF (ref 0–5)
pH: 7 (ref 5.0–8.0)

## 2021-01-25 LAB — LIPID PANEL
Cholesterol: 219 mg/dL — ABNORMAL HIGH (ref <200)
HDL: 83 mg/dL (ref >=50)
LDL Cholesterol (Calc): 111 mg/dL (calc) — ABNORMAL HIGH
Non-HDL Cholesterol (Calc): 136 mg/dL (calc) — ABNORMAL HIGH (ref <130)
Total CHOL/HDL Ratio: 2.6 (calc) (ref <5.0)
Triglycerides: 134 mg/dL (ref <150)

## 2021-01-25 LAB — MICROSCOPIC MESSAGE

## 2021-01-25 LAB — VITAMIN B12: Vitamin B-12: >2000 pg/mL — ABNORMAL HIGH (ref 200–1100)

## 2021-01-25 LAB — TSH: TSH: 0.86 mIU/L (ref 0.40–4.50)

## 2021-01-25 LAB — VITAMIN D 25 HYDROXY (VIT D DEFICIENCY, FRACTURES): Vit D, 25-Hydroxy: 64 ng/mL (ref 30–100)

## 2021-01-25 NOTE — Assessment & Plan Note (Signed)
BP Readings from Last 3 Encounters:  01/24/21 132/80  12/19/20 113/72  11/27/20 118/66   Stable, pt to continue medical treatment hyzaar, norvasc

## 2021-01-25 NOTE — Assessment & Plan Note (Signed)
Also for podiatry referral 

## 2021-01-25 NOTE — Assessment & Plan Note (Signed)
Pt cannot recall trauma but has diffuse bruising and swelling persistent for several wks- for xray

## 2021-01-25 NOTE — Assessment & Plan Note (Addendum)
Lab Results  Component Value Date   CREATININE 1.50 (H) 01/24/2021   Stable overall, cont to avoid nephrotoxins, has renal appt next wk

## 2021-01-25 NOTE — Assessment & Plan Note (Addendum)
With hx of ckd and immediate onset after lasix stopped, now 1-2+ bilat and moderate discomfort - ok for lasix restart 20 mg daily

## 2021-01-25 NOTE — Assessment & Plan Note (Signed)

## 2021-01-27 ENCOUNTER — Encounter: Payer: Self-pay | Admitting: Internal Medicine

## 2021-01-27 NOTE — Telephone Encounter (Signed)
  [name] from [facility] requesting verbal order to continue therapy if needed 1W4  Phone [phone]

## 2021-01-28 NOTE — Telephone Encounter (Signed)
Voicemail left on [employer]

## 2021-01-28 NOTE — Telephone Encounter (Signed)
Ok for verbal 

## 2021-01-30 ENCOUNTER — Ambulatory Visit (INDEPENDENT_AMBULATORY_CARE_PROVIDER_SITE_OTHER): Payer: Medicare HMO | Admitting: Endocrinology

## 2021-01-30 ENCOUNTER — Other Ambulatory Visit: Payer: Self-pay

## 2021-01-30 DIAGNOSIS — Z7952 Long term (current) use of systemic steroids: Secondary | ICD-10-CM

## 2021-01-30 NOTE — Progress Notes (Signed)
Subjective:    Patient ID: [name], female    DOB: 1935-11-01, 85 y.o.   MRN: [id number]  HPI Pt is ref by Dr Estanislado Pandy, for tapering of chronic steroids.  Pt is here with dtr, who provides some hx, due to pt's poor overall health.  For frozen shoulder syndrome, she has been on prednisone since 2017.  However, dtr says prednisone also helps pseudogout, myalgias, and general mobility.  She does not feel pt can further decrease.  She has no h/o adrenal disorder, and has never had adrenal imaging.  She has no h/o osteoporosis.   Past Medical History:  Diagnosis Date  . Allergic rhinitis 10/30/2016  . Anemia   . Asthma   . Breast cancer of upper-outer quadrant of left female breast ([place]) 11/08/2013   ER/PR+ Her2- Left IDC   . Chronic renal insufficiency   . Chronic rhinitis   . Colon polyp   . Diastolic dysfunction 5/78/4696  . DJD (degenerative joint disease)   . Dyspnea   . Full dentures   . GERD (gastroesophageal reflux disease)   . Hearing loss   . Hypertension   . Hyponatremia   . Impaired glucose tolerance 07/18/2014  . Memory loss   . Morbid obesity ([place])   . Poor circulation   . Vertigo   . Wears glasses     Past Surgical History:  Procedure Laterality Date  . ABDOMINAL HYSTERECTOMY    . BREAST LUMPECTOMY WITH NEEDLE LOCALIZATION AND AXILLARY SENTINEL LYMPH NODE BX Left 12/04/2013   Procedure: BREAST LUMPECTOMY WITH NEEDLE LOCALIZATION AND AXILLARY SENTINEL LYMPH NODE BX;  Surgeon: Shann Medal, MD;  Location: [place];  Service: General;  Laterality: Left;  . CATARACT EXTRACTION  2009   rt  . COLONOSCOPY    . EYE SURGERY Bilateral    cataract surgery  . KNEE ARTHROSCOPY     both  . LUMBAR LAMINECTOMY/DECOMPRESSION MICRODISCECTOMY Left 12/23/2017   Procedure: Left Lumbar One-Two Laminectomy with microdiscectomy;  Surgeon: Eustace Moore, MD;  Location: [place];  Service: Neurosurgery;  Laterality: Left;  Left L1-2 Laminectomy with  microdiscectomy  . TONSILLECTOMY    . TOTAL KNEE ARTHROPLASTY  2002   rt  . TOTAL KNEE ARTHROPLASTY  2003   left  . VESICOVAGINAL FISTULA CLOSURE W/ TAH  1980    Social History   Socioeconomic History  . Marital status: Widowed    Spouse name: Not on file  . Number of children: 2  . Years of education: Not on file  . Highest education level: Not on file  Occupational History  . Occupation: owns Teacher, adult education and works PT for [employer]  Tobacco Use  . Smoking status: Never Smoker  . Smokeless tobacco: Never Used  Vaping Use  . Vaping Use: Never used  Substance and Sexual Activity  . Alcohol use: No    Alcohol/week: 0.0 standard drinks  . Drug use: No  . Sexual activity: Not Currently  Other Topics Concern  . Not on file  Social History Narrative  . Not on file   Social Determinants of Health   Financial Resource Strain: Not on file  Food Insecurity: Not on file  Transportation Needs: Not on file  Physical Activity: Not on file  Stress: Not on file  Social Connections: Not on file  Intimate Partner Violence: Not on file    Current Outpatient Medications on File Prior to Visit  Medication Sig Dispense Refill  . ADVAIR DISKUS  250-50 MCG/DOSE AEPB INHALE 1 PUFF BY MOUTH TWICE A DAY 180 each 3  . albuterol (PROVENTIL HFA;VENTOLIN HFA) 108 (90 Base) MCG/ACT inhaler Inhale 2 puffs into the lungs every 6 (six) hours as needed for wheezing or shortness of breath. 1 Inhaler 11  . amLODipine (NORVASC) 5 MG tablet TAKE 1 TABLET BY MOUTH EVERY DAY 90 tablet 3  . aspirin 81 MG tablet Take 81 mg by mouth daily.    . Ensure (ENSURE) Take 1 Can by mouth 3 (three) times daily between meals. 237 mL 12  . FERREX 150 150 MG capsule TAKE 1 CAPSULE BY MOUTH TWICE A DAY 60 capsule 1  . furosemide (LASIX) 20 MG tablet TAKE 1 TABLET BY MOUTH EVERY DAY AS NEEDED 90 tablet 1  . gabapentin (NEURONTIN) 300 MG capsule TAKE 1 CAPSULE BY MOUTH THREE TIMES A DAY 90 capsule 5  . Incontinence Supply  Disposable (DEPEND UNDERWEAR SM/MED) MISC Use as directed four times per day 120 each 5  . losartan-hydrochlorothiazide (HYZAAR) 100-25 MG tablet TAKE 1 TABLET BY MOUTH EVERY DAY 90 tablet 2  . Magnesium Gluconate 500 (27 Mg) MG TABS TAKE 1 TABLET BY MOUTH TWICE A DAY 90 tablet 2  . meclizine (ANTIVERT) 12.5 MG tablet TAKE 1 TABLET BY MOUTH THREE TIMES A DAY AS NEEDED FOR DIZZINESS 270 tablet 0  . methocarbamol (ROBAXIN) 500 MG tablet Take 1 tablet (500 mg total) by mouth every 8 (eight) hours as needed for muscle spasms. 10 tablet 0  . MOMETASONE-AMMONIUM LACTATE EX mometasone    . montelukast (SINGULAIR) 10 MG tablet TAKE 1 TABLET BY MOUTH EVERY DAY 90 tablet 3  . Multiple Vitamins-Minerals (CENTRUM SILVER PO) Take 1 tablet by mouth daily.    . potassium chloride (KLOR-CON) 10 MEQ tablet TAKE 1 TABLET BY MOUTH EVERY DAY WHEN TAKING FUROSEMIDE 90 tablet 3  . predniSONE (DELTASONE) 1 MG tablet Take 1 tablet (1 mg total) by mouth daily. 180 tablet 0  . predniSONE (DELTASONE) 5 MG tablet Take 1 tablet (5 mg total) by mouth daily with breakfast. 90 tablet 0  . Simethicone (GAS-X PO) Take 1 tablet by mouth daily as needed (for gas).     . solifenacin (VESICARE) 5 MG tablet Take 1 tablet (5 mg total) by mouth daily. 90 tablet 3  . tizanidine (ZANAFLEX) 2 MG capsule TAKE 1 CAPSULE BY MOUTH 3 TIMES A DAY AS NEEDED FOR MUSCLE SPASMS 40 capsule 1  . traMADol (ULTRAM-ER) 200 MG 24 hr tablet TAKE 1 TABLET BY MOUTH EVERY DAY 90 tablet 1  . triamcinolone (KENALOG) 0.1 % Apply 1 application topically 2 (two) times daily. 30 g 1  . triamcinolone (NASACORT AQ) 55 MCG/ACT AERO nasal inhaler Place 2 sprays into the nose daily. 1 Inhaler 12   No current facility-administered medications on file prior to visit.    Allergies  Allergen Reactions  . Hydrocodone Itching  . Tizanidine Other (See Comments)    Dizzy and fall  . Iron Hives and Other (See Comments)    Whelps, bad constipation Other reaction(s):  Unknown    Family History  Problem Relation Age of Onset  . Colon cancer Mother        in her 53's  . Stomach cancer Mother   . Lung cancer Brother        was a smoker  . Heart attack Son 38  . Healthy Daughter     BP 140/82 (BP Location: Right Arm, Patient Position: Sitting, Cuff Size:  Normal)   Pulse (!) 102   Ht _0  (1.549 m)   Wt 158 lb (71.7 kg)   SpO2 91%   BMI 29.85 kg/m    Review of Systems Pt reports weight gain, fever, easy bruising, and fatigue.  No change in chronic doe.     Objective:   Physical Exam VS: see vs page.  GEN: no distress.   HEAD: head: no deformity eyes: no periorbital swelling, no proptosis external nose and ears are normal NECK: supple, thyroid is not enlarged CHEST WALL: no deformity LUNGS: clear to auscultation CV: reg rate and rhythm, no murmur.  MUSCULOSKELETAL: gait is steady, with a walker EXTEMITIES: no deformity.  1+ bilat leg edema NEURO:  readily moves all 4's.  sensation is intact to touch on all 4's SKIN:  Normal texture and temperature.  No rash or suspicious lesion is visible.   NODES:  None palpable at the neck PSYCH: alert, well-oriented.  Does not appear anxious nor depressed.  Lab Results  Component Value Date   TSH 0.86 01/24/2021   Lab Results  Component Value Date   CREATININE 1.50 (H) 01/24/2021   BUN 40 (H) 01/24/2021   NA 140 01/24/2021   K 4.6 01/24/2021   CL 101 01/24/2021   CO2 28 01/24/2021   Lab Results  Component Value Date   WBC 10.4 01/24/2021   HGB 11.2 (L) 01/24/2021   HCT 33.3 (L) 01/24/2021   MCV 100.6 (H) 01/24/2021   PLT 221 01/24/2021       Assessment & Plan:  Chronic steroid use, new to me.  We discussed request by Dr Patrecia Pour to taper.  dtr is under the impression that our function here is to provide an alternative treatment option for chronic pain.  Please continue the same prednisone for now.   Patient Instructions  Our office helps with the safe tapering of steroids when  the condition allows.  In your case, there is a question as to whether that condition allows now.  Please discuss this further with Dr Patrecia Pour, and call us when you are ready to taper.

## 2021-01-30 NOTE — Patient Instructions (Signed)
Our office helps with the safe tapering of steroids when the condition allows.  In your case, there is a question as to whether that condition allows now.  Please discuss this further with Dr Patrecia Pour, and call us when you are ready to taper.

## 2021-01-31 ENCOUNTER — Telehealth: Payer: Self-pay | Admitting: Internal Medicine

## 2021-01-31 ENCOUNTER — Ambulatory Visit
Admission: RE | Admit: 2021-01-31 | Discharge: 2021-01-31 | Disposition: A | Discharge status: Home / Self Care | Payer: Medicare HMO | Source: Ambulatory Visit | Attending: Nephrology | Admitting: Nephrology

## 2021-01-31 DIAGNOSIS — N184 Chronic kidney disease, stage 4 (severe): Secondary | ICD-10-CM | POA: Diagnosis not present

## 2021-01-31 DIAGNOSIS — M79674 Pain in right toe(s): Secondary | ICD-10-CM

## 2021-01-31 DIAGNOSIS — N281 Cyst of kidney, acquired: Secondary | ICD-10-CM | POA: Diagnosis not present

## 2021-01-31 NOTE — Telephone Encounter (Signed)
Patient wondering if she can be referred to podiatry about her toes.

## 2021-01-31 NOTE — Telephone Encounter (Signed)
Radovan with [facility] calling, states the OT evaluation is moving to next week on Monday because the patients had too many appointments this week.

## 2021-01-31 NOTE — Telephone Encounter (Signed)
Ok this is done 

## 2021-02-01 DIAGNOSIS — Z7952 Long term (current) use of systemic steroids: Secondary | ICD-10-CM | POA: Insufficient documentation

## 2021-02-03 ENCOUNTER — Telehealth: Payer: Self-pay | Admitting: Internal Medicine

## 2021-02-03 DIAGNOSIS — D649 Anemia, unspecified: Secondary | ICD-10-CM | POA: Diagnosis not present

## 2021-02-03 DIAGNOSIS — M1120 Other chondrocalcinosis, unspecified site: Secondary | ICD-10-CM | POA: Diagnosis not present

## 2021-02-03 DIAGNOSIS — J45909 Unspecified asthma, uncomplicated: Secondary | ICD-10-CM | POA: Diagnosis not present

## 2021-02-03 DIAGNOSIS — J309 Allergic rhinitis, unspecified: Secondary | ICD-10-CM | POA: Diagnosis not present

## 2021-02-03 DIAGNOSIS — I129 Hypertensive chronic kidney disease with stage 1 through stage 4 chronic kidney disease, or unspecified chronic kidney disease: Secondary | ICD-10-CM | POA: Diagnosis not present

## 2021-02-03 DIAGNOSIS — G8929 Other chronic pain: Secondary | ICD-10-CM | POA: Diagnosis not present

## 2021-02-03 DIAGNOSIS — N1831 Chronic kidney disease, stage 3a: Secondary | ICD-10-CM | POA: Diagnosis not present

## 2021-02-03 DIAGNOSIS — M5416 Radiculopathy, lumbar region: Secondary | ICD-10-CM | POA: Diagnosis not present

## 2021-02-03 DIAGNOSIS — R2689 Other abnormalities of gait and mobility: Secondary | ICD-10-CM | POA: Diagnosis not present

## 2021-02-03 DIAGNOSIS — M150 Primary generalized (osteo)arthritis: Secondary | ICD-10-CM | POA: Diagnosis not present

## 2021-02-03 NOTE — Telephone Encounter (Signed)
Ok for verbals 

## 2021-02-03 NOTE — Telephone Encounter (Signed)
Radovan w/ [facility] is requesting OT verbals for 1w5. Please advise   Okay to LVM: [phone]

## 2021-02-04 NOTE — Telephone Encounter (Signed)
Orders given to [facility]

## 2021-02-05 ENCOUNTER — Telehealth: Payer: Self-pay | Admitting: Internal Medicine

## 2021-02-05 DIAGNOSIS — I129 Hypertensive chronic kidney disease with stage 1 through stage 4 chronic kidney disease, or unspecified chronic kidney disease: Secondary | ICD-10-CM | POA: Diagnosis not present

## 2021-02-05 DIAGNOSIS — N1832 Chronic kidney disease, stage 3b: Secondary | ICD-10-CM | POA: Diagnosis not present

## 2021-02-05 DIAGNOSIS — E871 Hypo-osmolality and hyponatremia: Secondary | ICD-10-CM | POA: Diagnosis not present

## 2021-02-05 DIAGNOSIS — R600 Localized edema: Secondary | ICD-10-CM | POA: Diagnosis not present

## 2021-02-05 DIAGNOSIS — R3129 Other microscopic hematuria: Secondary | ICD-10-CM | POA: Diagnosis not present

## 2021-02-05 DIAGNOSIS — R7303 Prediabetes: Secondary | ICD-10-CM | POA: Diagnosis not present

## 2021-02-05 NOTE — Telephone Encounter (Signed)
Patient called and said that she is needing a letter stating that she needs a mailbox on the side of her house instead of at the road. She said that it would be easier for her to access it more. She has an OV on 02-06-21. Please advise

## 2021-02-06 ENCOUNTER — Ambulatory Visit: Payer: Medicare HMO | Admitting: Internal Medicine

## 2021-02-06 NOTE — Telephone Encounter (Signed)
Ok this is done 

## 2021-02-11 ENCOUNTER — Telehealth: Payer: Self-pay

## 2021-02-11 ENCOUNTER — Telehealth: Payer: Self-pay | Admitting: *Deleted

## 2021-02-11 DIAGNOSIS — M1120 Other chondrocalcinosis, unspecified site: Secondary | ICD-10-CM | POA: Diagnosis not present

## 2021-02-11 DIAGNOSIS — M5416 Radiculopathy, lumbar region: Secondary | ICD-10-CM | POA: Diagnosis not present

## 2021-02-11 DIAGNOSIS — J309 Allergic rhinitis, unspecified: Secondary | ICD-10-CM | POA: Diagnosis not present

## 2021-02-11 DIAGNOSIS — R2689 Other abnormalities of gait and mobility: Secondary | ICD-10-CM | POA: Diagnosis not present

## 2021-02-11 DIAGNOSIS — G8929 Other chronic pain: Secondary | ICD-10-CM | POA: Diagnosis not present

## 2021-02-11 DIAGNOSIS — M150 Primary generalized (osteo)arthritis: Secondary | ICD-10-CM | POA: Diagnosis not present

## 2021-02-11 DIAGNOSIS — I129 Hypertensive chronic kidney disease with stage 1 through stage 4 chronic kidney disease, or unspecified chronic kidney disease: Secondary | ICD-10-CM | POA: Diagnosis not present

## 2021-02-11 DIAGNOSIS — N1831 Chronic kidney disease, stage 3a: Secondary | ICD-10-CM | POA: Diagnosis not present

## 2021-02-11 DIAGNOSIS — D649 Anemia, unspecified: Secondary | ICD-10-CM | POA: Diagnosis not present

## 2021-02-11 DIAGNOSIS — J45909 Unspecified asthma, uncomplicated: Secondary | ICD-10-CM | POA: Diagnosis not present

## 2021-02-11 NOTE — Telephone Encounter (Signed)
Labs received from: [facility] on: 02/05/2021 Reviewed by: Hazel Sams, PA-C  Labs drawn: Urinalysis, PR/CR Ratio  Results: Protein 592

## 2021-02-11 NOTE — Telephone Encounter (Signed)
Letter mailed out to patient.

## 2021-02-12 ENCOUNTER — Ambulatory Visit (INDEPENDENT_AMBULATORY_CARE_PROVIDER_SITE_OTHER): Payer: Medicare HMO | Admitting: Internal Medicine

## 2021-02-12 ENCOUNTER — Other Ambulatory Visit: Payer: Self-pay

## 2021-02-12 ENCOUNTER — Ambulatory Visit: Payer: Medicare HMO | Admitting: Podiatry

## 2021-02-12 ENCOUNTER — Encounter: Payer: Self-pay | Admitting: Podiatry

## 2021-02-12 VITALS — BP 132/80 | HR 80 | Temp 98.2°F | Ht 61.0 in | Wt 152.0 lb

## 2021-02-12 DIAGNOSIS — N184 Chronic kidney disease, stage 4 (severe): Secondary | ICD-10-CM

## 2021-02-12 DIAGNOSIS — M25562 Pain in left knee: Secondary | ICD-10-CM | POA: Diagnosis not present

## 2021-02-12 DIAGNOSIS — I999 Unspecified disorder of circulatory system: Secondary | ICD-10-CM

## 2021-02-12 DIAGNOSIS — M79675 Pain in left toe(s): Secondary | ICD-10-CM

## 2021-02-12 DIAGNOSIS — B351 Tinea unguium: Secondary | ICD-10-CM | POA: Diagnosis not present

## 2021-02-12 DIAGNOSIS — R42 Dizziness and giddiness: Secondary | ICD-10-CM

## 2021-02-12 DIAGNOSIS — M778 Other enthesopathies, not elsewhere classified: Secondary | ICD-10-CM | POA: Diagnosis not present

## 2021-02-12 DIAGNOSIS — E559 Vitamin D deficiency, unspecified: Secondary | ICD-10-CM | POA: Diagnosis not present

## 2021-02-12 DIAGNOSIS — R609 Edema, unspecified: Secondary | ICD-10-CM

## 2021-02-12 DIAGNOSIS — M79674 Pain in right toe(s): Secondary | ICD-10-CM

## 2021-02-12 DIAGNOSIS — G8929 Other chronic pain: Secondary | ICD-10-CM | POA: Diagnosis not present

## 2021-02-12 DIAGNOSIS — M1120 Other chondrocalcinosis, unspecified site: Secondary | ICD-10-CM | POA: Insufficient documentation

## 2021-02-12 DIAGNOSIS — M353 Polymyalgia rheumatica: Secondary | ICD-10-CM | POA: Insufficient documentation

## 2021-02-12 DIAGNOSIS — M1990 Unspecified osteoarthritis, unspecified site: Secondary | ICD-10-CM | POA: Insufficient documentation

## 2021-02-12 DIAGNOSIS — M797 Fibromyalgia: Secondary | ICD-10-CM | POA: Insufficient documentation

## 2021-02-12 LAB — BASIC METABOLIC PANEL
BUN: 43 mg/dL — ABNORMAL HIGH (ref 6–23)
CO2: 33 mEq/L — ABNORMAL HIGH (ref 19–32)
Calcium: 10 mg/dL (ref 8.4–10.5)
Chloride: 96 mEq/L (ref 96–112)
Creatinine, Ser: 1.66 mg/dL — ABNORMAL HIGH (ref 0.40–1.20)
GFR: 28.4 mL/min — ABNORMAL LOW (ref >60.00)
Glucose, Bld: 107 mg/dL — ABNORMAL HIGH (ref 70–99)
Potassium: 3.5 mEq/L (ref 3.5–5.1)
Sodium: 139 mEq/L (ref 135–145)

## 2021-02-12 MED ORDER — TRIAMCINOLONE ACETONIDE 10 MG/ML IJ SUSP
20.0000 mg | Freq: Once | INTRAMUSCULAR | Status: AC
  Administered 2021-02-12: 20 mg

## 2021-02-12 NOTE — Patient Instructions (Signed)
Ok to continue the antivert for the vertigo  Please continue all other medications as before, including the lasix as you are taking  Please have the pharmacy call with any other refills you may need.  Please continue your efforts at being more active, low cholesterol diet, and weight control.  Please keep your appointments with your specialists as you may have planned  You will be contacted regarding the referral for: Dr Wynelle Link  Please go to the LAB at the blood drawing area for the tests to be done - just the potassium/kidney tests today  You will be contacted by phone if any changes need to be made immediately.  Otherwise, you will receive a letter about your results with an explanation, but please check with MyChart first.  Please remember to sign up for MyChart if you have not done so, as this will be important to you in the future with finding out test results, communicating by private email, and scheduling acute appointments online when needed.  Please make an Appointment to return in 4 months

## 2021-02-12 NOTE — Progress Notes (Signed)
Subjective:   Patient ID: [name], female   DOB: 85 y.o.   MRN: [id number]   HPI Patient presents with a lot of pain on top of both feet has a lot of arthritis is in poor health and is not ambulatory to a high degree.  She does not smoke and uses a walker to try to get around at this time   Review of Systems  All other systems reviewed and are negative.       Objective:  Physical Exam Vitals and nursing note reviewed.  Constitutional:      Appearance: She is well-developed.  Pulmonary:     Effort: Pulmonary effort is normal.  Musculoskeletal:        General: Normal range of motion.  Skin:    General: Skin is warm.  Neurological:     Mental Status: She is alert.     Vascular status found to be diminished with diminishment of sharp dull vibratory also noted and PT DP pulses.  Muscle strength reduced and range of motion subtalar midtarsal joint reduced.  Severe flatfoot deformity noted left over right exquisite discomfort noted of the extensor tendon complex bilateral with inflammation fluid noted around the joint surfaces and is found to have significant structural malalignment of both feet and into the digits and is noted to have mycotic infected toenails with pain 1-5 both feet     Assessment:  Poor health individual with unfortunate significant arthritic conditions and also noted to have extensor tendinitis along with probable midgrade vascular disease with thick yellow brittle nailbeds 1-5 both feet     Plan:  H&P reviewed conditions and explained to caregiver and her the difficulty of treatment.  Do not recommend at this time vascular studies may be necessary someday and I did try to inject the extensor tendon complex bilateral 3 mg Dexasone Kenalog 5 mg Xylocaine to try to give her relief I also went ahead today and I debrided her nailbeds 1-5 both feet that are thick and painful and impossible for them to cut

## 2021-02-12 NOTE — Progress Notes (Signed)
Patient ID: [name], female   DOB: 1935/11/05, 85 y.o.   MRN: [id number]        Chief Complaint: follow up leg swelling, left knee pain, and recurrence of vertigo       HPI:  [name] is a 85 y.o. female here with much improved leg swelling and several lbs wt loss, Pt denies chest pain, increased sob or doe, wheezing, orthopnea, PND, increased LE swelling, palpitations, dizziness or syncope.   Pt denies polydipsia, polyuria, denies new worsening focal neuro s/s except for midl worsening recurrent vertigo in the past 2 wks with position change  Does have worsening left knee pain for 1 mo, s/p TKR in remote past, but now pain worse to anterolateral without swelling or falls.   Pt denies fever, wt loss, night sweats, loss of appetite, or other constitutional symptoms  No other new complaints  Wt Readings from Last 3 Encounters:  02/12/21 152 lb (68.9 kg)  01/30/21 158 lb (71.7 kg)  01/24/21 158 lb (71.7 kg)   BP Readings from Last 3 Encounters:  02/12/21 132/80  01/30/21 140/82  01/24/21 132/80         Past Medical History:  Diagnosis Date  . Allergic rhinitis 10/30/2016  . Anemia   . Asthma   . Breast cancer of upper-outer quadrant of left female breast (Home) 11/08/2013   ER/PR+ Her2- Left IDC   . Chronic renal insufficiency   . Chronic rhinitis   . Colon polyp   . Diastolic dysfunction 11/29/5595  . DJD (degenerative joint disease)   . Dyspnea   . Full dentures   . GERD (gastroesophageal reflux disease)   . Hearing loss   . Hypertension   . Hyponatremia   . Impaired glucose tolerance 07/18/2014  . Memory loss   . Morbid obesity ([facility])   . Poor circulation   . Vertigo   . Wears glasses    Past Surgical History:  Procedure Laterality Date  . ABDOMINAL HYSTERECTOMY    . BREAST LUMPECTOMY WITH NEEDLE LOCALIZATION AND AXILLARY SENTINEL LYMPH NODE BX Left 12/04/2013   Procedure: BREAST LUMPECTOMY WITH NEEDLE LOCALIZATION AND AXILLARY SENTINEL LYMPH NODE BX;   Surgeon: Shann Medal, MD;  Location: [facility];  Service: General;  Laterality: Left;  . CATARACT EXTRACTION  2009   rt  . COLONOSCOPY    . EYE SURGERY Bilateral    cataract surgery  . KNEE ARTHROSCOPY     both  . LUMBAR LAMINECTOMY/DECOMPRESSION MICRODISCECTOMY Left 12/23/2017   Procedure: Left Lumbar One-Two Laminectomy with microdiscectomy;  Surgeon: Eustace Moore, MD;  Location: [facility];  Service: Neurosurgery;  Laterality: Left;  Left L1-2 Laminectomy with microdiscectomy  . TONSILLECTOMY    . TOTAL KNEE ARTHROPLASTY  2002   rt  . TOTAL KNEE ARTHROPLASTY  2003   left  . VESICOVAGINAL FISTULA CLOSURE W/ TAH  1980    reports that she has never smoked. She has never used smokeless tobacco. She reports that she does not drink alcohol and does not use drugs. family history includes Colon cancer in her mother; Healthy in her daughter; Heart attack (age of onset: 4) in her son; Lung cancer in her brother; Stomach cancer in her mother. Allergies  Allergen Reactions  . Hydrocodone Itching  . Tizanidine Other (See Comments)    Dizzy and fall  . Iron Hives and Other (See Comments)    [place], bad constipation Other reaction(s): Unknown   Current Outpatient Medications on  File Prior to Visit  Medication Sig Dispense Refill  . ADVAIR DISKUS 250-50 MCG/DOSE AEPB INHALE 1 PUFF BY MOUTH TWICE A DAY 180 each 3  . albuterol (PROVENTIL HFA;VENTOLIN HFA) 108 (90 Base) MCG/ACT inhaler Inhale 2 puffs into the lungs every 6 (six) hours as needed for wheezing or shortness of breath. 1 Inhaler 11  . amLODipine (NORVASC) 5 MG tablet TAKE 1 TABLET BY MOUTH EVERY DAY 90 tablet 3  . aspirin 81 MG tablet Take 81 mg by mouth daily.    . Ensure (ENSURE) Take 1 Can by mouth 3 (three) times daily between meals. 237 mL 12  . FERREX 150 150 MG capsule TAKE 1 CAPSULE BY MOUTH TWICE A DAY 60 capsule 1  . furosemide (LASIX) 20 MG tablet TAKE 1 TABLET BY MOUTH EVERY DAY AS NEEDED 90 tablet 1  .  gabapentin (NEURONTIN) 300 MG capsule TAKE 1 CAPSULE BY MOUTH THREE TIMES A DAY 90 capsule 5  . Incontinence Supply Disposable (DEPEND UNDERWEAR SM/MED) MISC Use as directed four times per day 120 each 5  . Magnesium Gluconate 500 (27 Mg) MG TABS TAKE 1 TABLET BY MOUTH TWICE A DAY 90 tablet 2  . meclizine (ANTIVERT) 12.5 MG tablet TAKE 1 TABLET BY MOUTH THREE TIMES A DAY AS NEEDED FOR DIZZINESS 270 tablet 0  . methocarbamol (ROBAXIN) 500 MG tablet Take 1 tablet (500 mg total) by mouth every 8 (eight) hours as needed for muscle spasms. 10 tablet 0  . MOMETASONE-AMMONIUM LACTATE EX mometasone    . montelukast (SINGULAIR) 10 MG tablet TAKE 1 TABLET BY MOUTH EVERY DAY 90 tablet 3  . Multiple Vitamins-Minerals (CENTRUM SILVER PO) Take 1 tablet by mouth daily.    . potassium chloride (KLOR-CON) 10 MEQ tablet TAKE 1 TABLET BY MOUTH EVERY DAY WHEN TAKING FUROSEMIDE 90 tablet 3  . predniSONE (DELTASONE) 1 MG tablet Take 1 tablet (1 mg total) by mouth daily. 180 tablet 0  . predniSONE (DELTASONE) 5 MG tablet Take 1 tablet (5 mg total) by mouth daily with breakfast. 90 tablet 0  . Simethicone (GAS-X PO) Take 1 tablet by mouth daily as needed (for gas).     . solifenacin (VESICARE) 5 MG tablet Take 1 tablet (5 mg total) by mouth daily. 90 tablet 3  . tizanidine (ZANAFLEX) 2 MG capsule TAKE 1 CAPSULE BY MOUTH 3 TIMES A DAY AS NEEDED FOR MUSCLE SPASMS 40 capsule 1  . traMADol (ULTRAM-ER) 200 MG 24 hr tablet TAKE 1 TABLET BY MOUTH EVERY DAY 90 tablet 1  . triamcinolone (KENALOG) 0.1 % Apply 1 application topically 2 (two) times daily. 30 g 1  . triamcinolone (NASACORT AQ) 55 MCG/ACT AERO nasal inhaler Place 2 sprays into the nose daily. 1 Inhaler 12  . Albuterol Sulfate (PROAIR RESPICLICK) 974 (90 Base) MCG/ACT AEPB 1 puff as needed    . ASPIRIN 81 PO 1 tablet    . diclofenac Sodium (VOLTAREN) 1 % GEL APPLY 2 GRAMS TO AFFECTED AREA 4 TIMES A DAY    . ergocalciferol (VITAMIN D2) 1.25 MG (50000 UT) capsule 1  capsule    . hydrOXYzine (VISTARIL) 50 MG capsule 1 capsule as needed    . losartan (COZAAR) 100 MG tablet Take 1 tablet by mouth daily.    . magnesium gluconate (MAGONATE) 500 MG tablet Take 500 mg by mouth 2 (two) times daily.    . pantoprazole (PROTONIX) 40 MG tablet Take 1 tablet by mouth daily.    . Potassium Gluconate 550  MG TABS 1 tablet     No current facility-administered medications on file prior to visit.        ROS:  All others reviewed and negative.  Objective        PE:  BP 132/80 (BP Location: Left Arm, Patient Position: Sitting, Cuff Size: Large)   Pulse 80   Temp 98.2 F (36.8 C) (Oral)   Ht $R'5\' 1"'tg$  (1.549 m)   Wt 152 lb (68.9 kg)   SpO2 96%   BMI 28.72 kg/m                 Constitutional: Pt appears in NAD               HENT: Head: NCAT.                Right Ear: External ear normal.                 Left Ear: External ear normal.                Eyes: . Pupils are equal, round, and reactive to light. Conjunctivae and EOM are normal               Nose: without d/c or deformity               Neck: Neck supple. Gross normal ROM               Cardiovascular: Normal rate and regular rhythm.                 Pulmonary/Chest: Effort normal and breath sounds without rales or wheezing.                Abd:  Soft, NT, ND, + BS, no organomegaly               Neurological: Pt is alert. At baseline orientation, motor grossly intact               Skin: Skin is warm. No rashes, no other new lesions, LE edema - trace bilat pedal only               Psychiatric: Pt behavior is normal without agitation   Micro: none  Cardiac tracings I have personally interpreted today:  none  Pertinent Radiological findings (summarize): none   Lab Results  Component Value Date   WBC 10.4 01/24/2021   HGB 11.2 (L) 01/24/2021   HCT 33.3 (L) 01/24/2021   PLT 221 01/24/2021   GLUCOSE 107 (H) 02/12/2021   CHOL 219 (H) 01/24/2021   TRIG 134 01/24/2021   HDL 83 01/24/2021   LDLDIRECT 109.0  08/01/2020   LDLCALC 111 (H) 01/24/2021   ALT 24 01/24/2021   AST 25 01/24/2021   NA 139 02/12/2021   K 3.5 02/12/2021   CL 96 02/12/2021   CREATININE 1.66 (H) 02/12/2021   BUN 43 (H) 02/12/2021   CO2 33 (H) 02/12/2021   TSH 0.86 01/24/2021   INR 1.35 01/17/2018   HGBA1C 5.9 08/01/2020   Assessment/Plan:  Aide SHERINE CORTESE is a 85 y.o. Black or African American [2] female with  has a past medical history of Allergic rhinitis (10/30/2016), Anemia, Asthma, Breast cancer of upper-outer quadrant of left female breast ([facility]) (11/08/2013), Chronic renal insufficiency, Chronic rhinitis, Colon polyp, Diastolic dysfunction (12/24/6008), DJD (degenerative joint disease), Dyspnea, Full dentures, GERD (gastroesophageal reflux disease), Hearing loss, Hypertension, Hyponatremia, Impaired glucose tolerance (07/18/2014), Memory loss, Morbid obesity ([facility]),  Poor circulation, Vertigo, and Wears glasses.  Peripheral edema Improved, cont lasix,  to f/u any worsening symptoms or concerns, for BMP today  CKD (chronic kidney disease) stage 4, GFR 15-29 ml/min (HCC) Lab Results  Component Value Date   CREATININE 1.66 (H) 02/12/2021   Stable overall, cont to avoid nephrotoxins  Left knee pain S/p tkr - with worsening pain, for ortho referral Dr Wynelle Link  Vertigo Ok for meclizine prn restart,  to f/u any worsening symptoms or concerns  Vitamin D deficiency Last vitamin D Lab Results  Component Value Date   VD25OH 64 01/24/2021   Stable, cont oral replacement  Followup: Return in about 4 months (around 06/14/2021).  Cathlean Cower, MD 02/16/2021 6:36 PM [facility]

## 2021-02-13 ENCOUNTER — Encounter: Payer: Self-pay | Admitting: Internal Medicine

## 2021-02-13 DIAGNOSIS — J309 Allergic rhinitis, unspecified: Secondary | ICD-10-CM | POA: Diagnosis not present

## 2021-02-13 DIAGNOSIS — M5416 Radiculopathy, lumbar region: Secondary | ICD-10-CM | POA: Diagnosis not present

## 2021-02-13 DIAGNOSIS — R2689 Other abnormalities of gait and mobility: Secondary | ICD-10-CM | POA: Diagnosis not present

## 2021-02-13 DIAGNOSIS — J45909 Unspecified asthma, uncomplicated: Secondary | ICD-10-CM | POA: Diagnosis not present

## 2021-02-13 DIAGNOSIS — D649 Anemia, unspecified: Secondary | ICD-10-CM | POA: Diagnosis not present

## 2021-02-13 DIAGNOSIS — M1120 Other chondrocalcinosis, unspecified site: Secondary | ICD-10-CM | POA: Diagnosis not present

## 2021-02-13 DIAGNOSIS — M150 Primary generalized (osteo)arthritis: Secondary | ICD-10-CM | POA: Diagnosis not present

## 2021-02-13 DIAGNOSIS — I129 Hypertensive chronic kidney disease with stage 1 through stage 4 chronic kidney disease, or unspecified chronic kidney disease: Secondary | ICD-10-CM | POA: Diagnosis not present

## 2021-02-13 DIAGNOSIS — N1831 Chronic kidney disease, stage 3a: Secondary | ICD-10-CM | POA: Diagnosis not present

## 2021-02-13 DIAGNOSIS — G8929 Other chronic pain: Secondary | ICD-10-CM | POA: Diagnosis not present

## 2021-02-16 ENCOUNTER — Encounter: Payer: Self-pay | Admitting: Internal Medicine

## 2021-02-16 NOTE — Assessment & Plan Note (Signed)
Last vitamin D Lab Results  Component Value Date   VD25OH 64 01/24/2021   Stable, cont oral replacement

## 2021-02-16 NOTE — Assessment & Plan Note (Signed)
[place] for meclizine prn restart,  to f/u any worsening symptoms or concerns

## 2021-02-16 NOTE — Assessment & Plan Note (Signed)
Improved, cont lasix,  to f/u any worsening symptoms or concerns, for BMP today

## 2021-02-16 NOTE — Assessment & Plan Note (Signed)
Lab Results  Component Value Date   CREATININE 1.66 (H) 02/12/2021   Stable overall, cont to avoid nephrotoxins

## 2021-02-16 NOTE — Assessment & Plan Note (Signed)
S/p tkr - with worsening pain, for ortho referral Dr Wynelle Link

## 2021-02-20 DIAGNOSIS — D649 Anemia, unspecified: Secondary | ICD-10-CM | POA: Diagnosis not present

## 2021-02-20 DIAGNOSIS — J45909 Unspecified asthma, uncomplicated: Secondary | ICD-10-CM | POA: Diagnosis not present

## 2021-02-20 DIAGNOSIS — M150 Primary generalized (osteo)arthritis: Secondary | ICD-10-CM | POA: Diagnosis not present

## 2021-02-20 DIAGNOSIS — J309 Allergic rhinitis, unspecified: Secondary | ICD-10-CM | POA: Diagnosis not present

## 2021-02-20 DIAGNOSIS — Z96653 Presence of artificial knee joint, bilateral: Secondary | ICD-10-CM | POA: Diagnosis not present

## 2021-02-20 DIAGNOSIS — M5416 Radiculopathy, lumbar region: Secondary | ICD-10-CM | POA: Diagnosis not present

## 2021-02-20 DIAGNOSIS — M1120 Other chondrocalcinosis, unspecified site: Secondary | ICD-10-CM | POA: Diagnosis not present

## 2021-02-20 DIAGNOSIS — G8929 Other chronic pain: Secondary | ICD-10-CM | POA: Diagnosis not present

## 2021-02-20 DIAGNOSIS — R2689 Other abnormalities of gait and mobility: Secondary | ICD-10-CM | POA: Diagnosis not present

## 2021-02-20 DIAGNOSIS — I129 Hypertensive chronic kidney disease with stage 1 through stage 4 chronic kidney disease, or unspecified chronic kidney disease: Secondary | ICD-10-CM | POA: Diagnosis not present

## 2021-02-20 DIAGNOSIS — N1831 Chronic kidney disease, stage 3a: Secondary | ICD-10-CM | POA: Diagnosis not present

## 2021-02-24 DIAGNOSIS — J45909 Unspecified asthma, uncomplicated: Secondary | ICD-10-CM | POA: Diagnosis not present

## 2021-02-24 DIAGNOSIS — J309 Allergic rhinitis, unspecified: Secondary | ICD-10-CM | POA: Diagnosis not present

## 2021-02-24 DIAGNOSIS — I129 Hypertensive chronic kidney disease with stage 1 through stage 4 chronic kidney disease, or unspecified chronic kidney disease: Secondary | ICD-10-CM | POA: Diagnosis not present

## 2021-02-24 DIAGNOSIS — M1120 Other chondrocalcinosis, unspecified site: Secondary | ICD-10-CM | POA: Diagnosis not present

## 2021-02-24 DIAGNOSIS — G8929 Other chronic pain: Secondary | ICD-10-CM | POA: Diagnosis not present

## 2021-02-24 DIAGNOSIS — D649 Anemia, unspecified: Secondary | ICD-10-CM | POA: Diagnosis not present

## 2021-02-24 DIAGNOSIS — M5416 Radiculopathy, lumbar region: Secondary | ICD-10-CM | POA: Diagnosis not present

## 2021-02-24 DIAGNOSIS — M150 Primary generalized (osteo)arthritis: Secondary | ICD-10-CM | POA: Diagnosis not present

## 2021-02-24 DIAGNOSIS — R2689 Other abnormalities of gait and mobility: Secondary | ICD-10-CM | POA: Diagnosis not present

## 2021-02-24 DIAGNOSIS — N1831 Chronic kidney disease, stage 3a: Secondary | ICD-10-CM | POA: Diagnosis not present

## 2021-02-25 ENCOUNTER — Telehealth: Payer: Self-pay | Admitting: Internal Medicine

## 2021-02-25 NOTE — Telephone Encounter (Signed)
   Patient wants to know if she needs be taking FERREX 150 150 MG capsule, if so call to   Please call [id number] - [place], [place] - [address]. AT [address]

## 2021-02-27 ENCOUNTER — Telehealth: Payer: Self-pay | Admitting: Podiatry

## 2021-02-27 NOTE — Telephone Encounter (Signed)
No further plan for care. Medication was used to reduce inflammation

## 2021-02-27 NOTE — Telephone Encounter (Signed)
Patient called and stated that she sent Dr. Paulla Dolly on 4/20 and she really didn't understand the diagnosis. She also wants a nurse to give her a class to explain and wanted to know if there was a plan of care.   Please give patient a call as soon as you can

## 2021-02-28 NOTE — Telephone Encounter (Signed)
Patient has been notified

## 2021-02-28 NOTE — Telephone Encounter (Signed)
Unless the patent has been told in the past that she needs to continue this "forever" due to chronic GI blood loss, it appears she can now hold off as recent labs have been ok

## 2021-03-03 ENCOUNTER — Telehealth: Payer: Self-pay

## 2021-03-03 ENCOUNTER — Other Ambulatory Visit: Payer: Self-pay | Admitting: Internal Medicine

## 2021-03-03 DIAGNOSIS — Z5181 Encounter for therapeutic drug level monitoring: Secondary | ICD-10-CM

## 2021-03-03 DIAGNOSIS — Z7952 Long term (current) use of systemic steroids: Secondary | ICD-10-CM

## 2021-03-03 DIAGNOSIS — N183 Chronic kidney disease, stage 3 unspecified: Secondary | ICD-10-CM

## 2021-03-03 DIAGNOSIS — R7989 Other specified abnormal findings of blood chemistry: Secondary | ICD-10-CM

## 2021-03-03 DIAGNOSIS — R944 Abnormal results of kidney function studies: Secondary | ICD-10-CM

## 2021-03-03 NOTE — Telephone Encounter (Signed)
Patient left a voicemail stating she has questions about reducing her Prednisone medication.  Patient requested a return call.

## 2021-03-03 NOTE — Telephone Encounter (Signed)
Patient is having increased pain while reducing the prednisone, patient wants to know what she should take for her pain. Please advise.

## 2021-03-04 ENCOUNTER — Other Ambulatory Visit: Payer: Self-pay | Admitting: *Deleted

## 2021-03-04 DIAGNOSIS — M150 Primary generalized (osteo)arthritis: Secondary | ICD-10-CM | POA: Diagnosis not present

## 2021-03-04 DIAGNOSIS — G8929 Other chronic pain: Secondary | ICD-10-CM | POA: Diagnosis not present

## 2021-03-04 DIAGNOSIS — N1831 Chronic kidney disease, stage 3a: Secondary | ICD-10-CM | POA: Diagnosis not present

## 2021-03-04 DIAGNOSIS — R7989 Other specified abnormal findings of blood chemistry: Secondary | ICD-10-CM

## 2021-03-04 DIAGNOSIS — R2689 Other abnormalities of gait and mobility: Secondary | ICD-10-CM | POA: Diagnosis not present

## 2021-03-04 DIAGNOSIS — Z7952 Long term (current) use of systemic steroids: Secondary | ICD-10-CM

## 2021-03-04 DIAGNOSIS — D649 Anemia, unspecified: Secondary | ICD-10-CM | POA: Diagnosis not present

## 2021-03-04 DIAGNOSIS — J45909 Unspecified asthma, uncomplicated: Secondary | ICD-10-CM | POA: Diagnosis not present

## 2021-03-04 DIAGNOSIS — R944 Abnormal results of kidney function studies: Secondary | ICD-10-CM

## 2021-03-04 DIAGNOSIS — M1120 Other chondrocalcinosis, unspecified site: Secondary | ICD-10-CM | POA: Diagnosis not present

## 2021-03-04 DIAGNOSIS — Z5181 Encounter for therapeutic drug level monitoring: Secondary | ICD-10-CM

## 2021-03-04 DIAGNOSIS — N183 Chronic kidney disease, stage 3 unspecified: Secondary | ICD-10-CM

## 2021-03-04 DIAGNOSIS — I129 Hypertensive chronic kidney disease with stage 1 through stage 4 chronic kidney disease, or unspecified chronic kidney disease: Secondary | ICD-10-CM | POA: Diagnosis not present

## 2021-03-04 DIAGNOSIS — M5416 Radiculopathy, lumbar region: Secondary | ICD-10-CM | POA: Diagnosis not present

## 2021-03-04 DIAGNOSIS — J309 Allergic rhinitis, unspecified: Secondary | ICD-10-CM | POA: Diagnosis not present

## 2021-03-04 NOTE — Telephone Encounter (Signed)
I returned patient's call.  She states she has been having increased pain in her joints and muscles since she has been on prednisone 4 mg p.o. daily.  I advised her to come to the office or go to a nearby lab for CK and sed rate.  Patient stated that she will discuss with her daughter and will let us know when she can come to the office or go to a nearby lab.

## 2021-03-06 ENCOUNTER — Other Ambulatory Visit: Payer: Self-pay

## 2021-03-06 DIAGNOSIS — N183 Chronic kidney disease, stage 3 unspecified: Secondary | ICD-10-CM

## 2021-03-06 DIAGNOSIS — Z5181 Encounter for therapeutic drug level monitoring: Secondary | ICD-10-CM

## 2021-03-06 DIAGNOSIS — R7989 Other specified abnormal findings of blood chemistry: Secondary | ICD-10-CM

## 2021-03-06 DIAGNOSIS — R944 Abnormal results of kidney function studies: Secondary | ICD-10-CM

## 2021-03-06 DIAGNOSIS — Z7952 Long term (current) use of systemic steroids: Secondary | ICD-10-CM | POA: Diagnosis not present

## 2021-03-07 DIAGNOSIS — N1831 Chronic kidney disease, stage 3a: Secondary | ICD-10-CM | POA: Diagnosis not present

## 2021-03-07 DIAGNOSIS — M5416 Radiculopathy, lumbar region: Secondary | ICD-10-CM | POA: Diagnosis not present

## 2021-03-07 DIAGNOSIS — I129 Hypertensive chronic kidney disease with stage 1 through stage 4 chronic kidney disease, or unspecified chronic kidney disease: Secondary | ICD-10-CM | POA: Diagnosis not present

## 2021-03-07 DIAGNOSIS — M150 Primary generalized (osteo)arthritis: Secondary | ICD-10-CM | POA: Diagnosis not present

## 2021-03-07 DIAGNOSIS — R2689 Other abnormalities of gait and mobility: Secondary | ICD-10-CM | POA: Diagnosis not present

## 2021-03-07 DIAGNOSIS — D649 Anemia, unspecified: Secondary | ICD-10-CM | POA: Diagnosis not present

## 2021-03-07 DIAGNOSIS — G8929 Other chronic pain: Secondary | ICD-10-CM | POA: Diagnosis not present

## 2021-03-07 DIAGNOSIS — J45909 Unspecified asthma, uncomplicated: Secondary | ICD-10-CM | POA: Diagnosis not present

## 2021-03-07 DIAGNOSIS — M1120 Other chondrocalcinosis, unspecified site: Secondary | ICD-10-CM | POA: Diagnosis not present

## 2021-03-07 DIAGNOSIS — J309 Allergic rhinitis, unspecified: Secondary | ICD-10-CM | POA: Diagnosis not present

## 2021-03-07 LAB — SEDIMENTATION RATE: Sed Rate: 6 mm/h (ref 0–30)

## 2021-03-07 LAB — CK: Total CK: 179 U/L — ABNORMAL HIGH (ref 29–143)

## 2021-03-09 NOTE — Progress Notes (Signed)
Please add aldolase and myositis assessment panel. Increase prednisone to 7 mg po qd.

## 2021-03-10 ENCOUNTER — Other Ambulatory Visit: Payer: Self-pay | Admitting: *Deleted

## 2021-03-10 MED ORDER — PREDNISONE 5 MG PO TABS: 5.0000 mg | ORAL_TABLET | Freq: Every day | ORAL | 0 refills | Status: DC

## 2021-03-10 MED ORDER — PREDNISONE 1 MG PO TABS: 2.0000 mg | ORAL_TABLET | Freq: Every day | ORAL | 0 refills | Status: DC

## 2021-03-11 DIAGNOSIS — M5416 Radiculopathy, lumbar region: Secondary | ICD-10-CM | POA: Diagnosis not present

## 2021-03-11 DIAGNOSIS — D649 Anemia, unspecified: Secondary | ICD-10-CM | POA: Diagnosis not present

## 2021-03-11 DIAGNOSIS — N1831 Chronic kidney disease, stage 3a: Secondary | ICD-10-CM | POA: Diagnosis not present

## 2021-03-11 DIAGNOSIS — I129 Hypertensive chronic kidney disease with stage 1 through stage 4 chronic kidney disease, or unspecified chronic kidney disease: Secondary | ICD-10-CM | POA: Diagnosis not present

## 2021-03-11 DIAGNOSIS — R2689 Other abnormalities of gait and mobility: Secondary | ICD-10-CM | POA: Diagnosis not present

## 2021-03-11 DIAGNOSIS — M1120 Other chondrocalcinosis, unspecified site: Secondary | ICD-10-CM | POA: Diagnosis not present

## 2021-03-11 DIAGNOSIS — G8929 Other chronic pain: Secondary | ICD-10-CM | POA: Diagnosis not present

## 2021-03-11 DIAGNOSIS — J45909 Unspecified asthma, uncomplicated: Secondary | ICD-10-CM | POA: Diagnosis not present

## 2021-03-11 DIAGNOSIS — J309 Allergic rhinitis, unspecified: Secondary | ICD-10-CM | POA: Diagnosis not present

## 2021-03-11 DIAGNOSIS — M150 Primary generalized (osteo)arthritis: Secondary | ICD-10-CM | POA: Diagnosis not present

## 2021-03-12 ENCOUNTER — Other Ambulatory Visit: Payer: Self-pay | Admitting: *Deleted

## 2021-03-12 DIAGNOSIS — R944 Abnormal results of kidney function studies: Secondary | ICD-10-CM

## 2021-03-12 DIAGNOSIS — G8929 Other chronic pain: Secondary | ICD-10-CM | POA: Diagnosis not present

## 2021-03-12 DIAGNOSIS — M150 Primary generalized (osteo)arthritis: Secondary | ICD-10-CM | POA: Diagnosis not present

## 2021-03-12 DIAGNOSIS — N183 Chronic kidney disease, stage 3 unspecified: Secondary | ICD-10-CM

## 2021-03-12 DIAGNOSIS — M1120 Other chondrocalcinosis, unspecified site: Secondary | ICD-10-CM | POA: Diagnosis not present

## 2021-03-12 DIAGNOSIS — N1831 Chronic kidney disease, stage 3a: Secondary | ICD-10-CM | POA: Diagnosis not present

## 2021-03-12 DIAGNOSIS — M5416 Radiculopathy, lumbar region: Secondary | ICD-10-CM | POA: Diagnosis not present

## 2021-03-12 DIAGNOSIS — Z7952 Long term (current) use of systemic steroids: Secondary | ICD-10-CM

## 2021-03-12 DIAGNOSIS — D649 Anemia, unspecified: Secondary | ICD-10-CM | POA: Diagnosis not present

## 2021-03-12 DIAGNOSIS — R2689 Other abnormalities of gait and mobility: Secondary | ICD-10-CM | POA: Diagnosis not present

## 2021-03-12 DIAGNOSIS — I129 Hypertensive chronic kidney disease with stage 1 through stage 4 chronic kidney disease, or unspecified chronic kidney disease: Secondary | ICD-10-CM | POA: Diagnosis not present

## 2021-03-12 DIAGNOSIS — J45909 Unspecified asthma, uncomplicated: Secondary | ICD-10-CM | POA: Diagnosis not present

## 2021-03-12 DIAGNOSIS — Z5181 Encounter for therapeutic drug level monitoring: Secondary | ICD-10-CM

## 2021-03-12 DIAGNOSIS — J309 Allergic rhinitis, unspecified: Secondary | ICD-10-CM | POA: Diagnosis not present

## 2021-03-12 DIAGNOSIS — R7989 Other specified abnormal findings of blood chemistry: Secondary | ICD-10-CM

## 2021-03-13 ENCOUNTER — Telehealth: Payer: Self-pay | Admitting: Internal Medicine

## 2021-03-13 DIAGNOSIS — G8929 Other chronic pain: Secondary | ICD-10-CM

## 2021-03-13 DIAGNOSIS — R269 Unspecified abnormalities of gait and mobility: Secondary | ICD-10-CM

## 2021-03-13 DIAGNOSIS — R531 Weakness: Secondary | ICD-10-CM

## 2021-03-13 DIAGNOSIS — I10 Essential (primary) hypertension: Secondary | ICD-10-CM

## 2021-03-13 DIAGNOSIS — N184 Chronic kidney disease, stage 4 (severe): Secondary | ICD-10-CM

## 2021-03-13 NOTE — Telephone Encounter (Signed)
Ok done

## 2021-03-13 NOTE — Telephone Encounter (Signed)
Patient calling, states we need to call [employer] and recommend home health for the patient.

## 2021-03-14 ENCOUNTER — Other Ambulatory Visit: Payer: Self-pay | Admitting: *Deleted

## 2021-03-14 DIAGNOSIS — R944 Abnormal results of kidney function studies: Secondary | ICD-10-CM

## 2021-03-14 DIAGNOSIS — N183 Chronic kidney disease, stage 3 unspecified: Secondary | ICD-10-CM

## 2021-03-14 DIAGNOSIS — R7989 Other specified abnormal findings of blood chemistry: Secondary | ICD-10-CM | POA: Diagnosis not present

## 2021-03-14 DIAGNOSIS — Z7952 Long term (current) use of systemic steroids: Secondary | ICD-10-CM

## 2021-03-14 DIAGNOSIS — Z5181 Encounter for therapeutic drug level monitoring: Secondary | ICD-10-CM

## 2021-03-18 NOTE — Progress Notes (Signed)
Aldolase is normal.

## 2021-03-21 ENCOUNTER — Telehealth: Payer: Self-pay | Admitting: Internal Medicine

## 2021-03-21 DIAGNOSIS — J45909 Unspecified asthma, uncomplicated: Secondary | ICD-10-CM | POA: Diagnosis not present

## 2021-03-21 DIAGNOSIS — I129 Hypertensive chronic kidney disease with stage 1 through stage 4 chronic kidney disease, or unspecified chronic kidney disease: Secondary | ICD-10-CM | POA: Diagnosis not present

## 2021-03-21 DIAGNOSIS — D649 Anemia, unspecified: Secondary | ICD-10-CM | POA: Diagnosis not present

## 2021-03-21 DIAGNOSIS — M1120 Other chondrocalcinosis, unspecified site: Secondary | ICD-10-CM | POA: Diagnosis not present

## 2021-03-21 DIAGNOSIS — M150 Primary generalized (osteo)arthritis: Secondary | ICD-10-CM | POA: Diagnosis not present

## 2021-03-21 DIAGNOSIS — J309 Allergic rhinitis, unspecified: Secondary | ICD-10-CM | POA: Diagnosis not present

## 2021-03-21 DIAGNOSIS — G8929 Other chronic pain: Secondary | ICD-10-CM | POA: Diagnosis not present

## 2021-03-21 DIAGNOSIS — N1831 Chronic kidney disease, stage 3a: Secondary | ICD-10-CM | POA: Diagnosis not present

## 2021-03-21 DIAGNOSIS — R2689 Other abnormalities of gait and mobility: Secondary | ICD-10-CM | POA: Diagnosis not present

## 2021-03-21 DIAGNOSIS — M5416 Radiculopathy, lumbar region: Secondary | ICD-10-CM | POA: Diagnosis not present

## 2021-03-21 NOTE — Telephone Encounter (Signed)
[name] from [facility] has called with a medication question.  Patient has both Losartan 100-25MG  and Losartan posstasium 100MG  and they would like to know if she is supposed to be on both?  Please advise. Either contact the patient [phone] or Curt Bears [phone]

## 2021-03-21 NOTE — Telephone Encounter (Signed)
Notified Curt Bears [phone] patient shoud only be taking the 100mg .

## 2021-03-21 NOTE — Telephone Encounter (Signed)
No, just the losartan 100 mg, since she is also on lasix (does not need the hct)

## 2021-03-25 LAB — MYOSITIS ASSESSR PLUS JO-1 AUTOABS
EJ Autoabs: NOT DETECTED
Jo-1 Autoabs: <1 AI
Ku Autoabs: NOT DETECTED
Mi-2 Autoabs: NOT DETECTED
OJ Autoabs: NOT DETECTED
PL-12 Autoabs: NOT DETECTED
PL-7 Autoabs: NOT DETECTED
SRP Autoabs: NOT DETECTED

## 2021-03-25 LAB — ALDOLASE: Aldolase: 5.2 U/L (ref <=8.1)

## 2021-03-25 NOTE — Progress Notes (Signed)
Myositis panel was negative.

## 2021-03-26 ENCOUNTER — Encounter: Payer: Self-pay | Admitting: Oncology

## 2021-03-26 ENCOUNTER — Telehealth: Payer: Self-pay

## 2021-03-26 NOTE — Telephone Encounter (Signed)
Patient left a voicemail (03/25/21 at 5:05 pm) requesting a return call regarding changing the dosage of her medication.

## 2021-03-26 NOTE — Telephone Encounter (Signed)
Patient advised she may reduce prednisone to 6 mg p.o. daily if tolerated. Patient expressed understanding.

## 2021-03-26 NOTE — Telephone Encounter (Signed)
Returned call to the patient and she states she is currently on Prednisone 7 mg. Patient would like to know if she is to continue the Prednisone at 7 mg or is she to reduce her dose. Please advise.   Per office note on 12/19/2020: She is currently on prednisone 7 mg p.o. daily.  She states she is experiencing difficulty tapering prednisone.  I am uncertain for how many years she was on prednisone prior to seeing me.  I will refer her to endocrinology to rule out adrenal insufficiency

## 2021-03-26 NOTE — Telephone Encounter (Signed)
She may reduce prednisone to 6 mg p.o. daily if tolerated.

## 2021-03-27 ENCOUNTER — Telehealth: Payer: Self-pay | Admitting: Internal Medicine

## 2021-03-27 DIAGNOSIS — M5416 Radiculopathy, lumbar region: Secondary | ICD-10-CM | POA: Diagnosis not present

## 2021-03-27 DIAGNOSIS — R2689 Other abnormalities of gait and mobility: Secondary | ICD-10-CM | POA: Diagnosis not present

## 2021-03-27 DIAGNOSIS — N1831 Chronic kidney disease, stage 3a: Secondary | ICD-10-CM | POA: Diagnosis not present

## 2021-03-27 DIAGNOSIS — M150 Primary generalized (osteo)arthritis: Secondary | ICD-10-CM | POA: Diagnosis not present

## 2021-03-27 DIAGNOSIS — J45909 Unspecified asthma, uncomplicated: Secondary | ICD-10-CM | POA: Diagnosis not present

## 2021-03-27 DIAGNOSIS — M1120 Other chondrocalcinosis, unspecified site: Secondary | ICD-10-CM | POA: Diagnosis not present

## 2021-03-27 DIAGNOSIS — I129 Hypertensive chronic kidney disease with stage 1 through stage 4 chronic kidney disease, or unspecified chronic kidney disease: Secondary | ICD-10-CM | POA: Diagnosis not present

## 2021-03-27 DIAGNOSIS — D649 Anemia, unspecified: Secondary | ICD-10-CM | POA: Diagnosis not present

## 2021-03-27 DIAGNOSIS — J309 Allergic rhinitis, unspecified: Secondary | ICD-10-CM | POA: Diagnosis not present

## 2021-03-27 DIAGNOSIS — G8929 Other chronic pain: Secondary | ICD-10-CM | POA: Diagnosis not present

## 2021-03-27 NOTE — Telephone Encounter (Signed)
Ok this is noted, no  changes for now

## 2021-03-27 NOTE — Telephone Encounter (Signed)
Radovan w/ [facility] said that patient is being discharged from home OT. He said that she met her OT goals and is doing good. He said that she did miss a visit last week because she did not feel well. Please advise   Okay to LVM: [phone]

## 2021-03-28 NOTE — Telephone Encounter (Signed)
Verbals left on confidential voicemail  °

## 2021-04-03 DIAGNOSIS — M150 Primary generalized (osteo)arthritis: Secondary | ICD-10-CM | POA: Diagnosis not present

## 2021-04-03 DIAGNOSIS — D649 Anemia, unspecified: Secondary | ICD-10-CM | POA: Diagnosis not present

## 2021-04-03 DIAGNOSIS — G8929 Other chronic pain: Secondary | ICD-10-CM | POA: Diagnosis not present

## 2021-04-03 DIAGNOSIS — M1120 Other chondrocalcinosis, unspecified site: Secondary | ICD-10-CM | POA: Diagnosis not present

## 2021-04-03 DIAGNOSIS — R2689 Other abnormalities of gait and mobility: Secondary | ICD-10-CM | POA: Diagnosis not present

## 2021-04-03 DIAGNOSIS — I129 Hypertensive chronic kidney disease with stage 1 through stage 4 chronic kidney disease, or unspecified chronic kidney disease: Secondary | ICD-10-CM | POA: Diagnosis not present

## 2021-04-03 DIAGNOSIS — M5416 Radiculopathy, lumbar region: Secondary | ICD-10-CM | POA: Diagnosis not present

## 2021-04-03 DIAGNOSIS — J45909 Unspecified asthma, uncomplicated: Secondary | ICD-10-CM | POA: Diagnosis not present

## 2021-04-03 DIAGNOSIS — N1831 Chronic kidney disease, stage 3a: Secondary | ICD-10-CM | POA: Diagnosis not present

## 2021-04-03 DIAGNOSIS — J309 Allergic rhinitis, unspecified: Secondary | ICD-10-CM | POA: Diagnosis not present

## 2021-04-05 ENCOUNTER — Other Ambulatory Visit: Payer: Self-pay | Admitting: Rheumatology

## 2021-04-08 DIAGNOSIS — M1120 Other chondrocalcinosis, unspecified site: Secondary | ICD-10-CM | POA: Diagnosis not present

## 2021-04-08 DIAGNOSIS — M5416 Radiculopathy, lumbar region: Secondary | ICD-10-CM | POA: Diagnosis not present

## 2021-04-08 DIAGNOSIS — M150 Primary generalized (osteo)arthritis: Secondary | ICD-10-CM | POA: Diagnosis not present

## 2021-04-08 DIAGNOSIS — J309 Allergic rhinitis, unspecified: Secondary | ICD-10-CM | POA: Diagnosis not present

## 2021-04-08 DIAGNOSIS — R2689 Other abnormalities of gait and mobility: Secondary | ICD-10-CM | POA: Diagnosis not present

## 2021-04-08 DIAGNOSIS — N1831 Chronic kidney disease, stage 3a: Secondary | ICD-10-CM | POA: Diagnosis not present

## 2021-04-08 DIAGNOSIS — I129 Hypertensive chronic kidney disease with stage 1 through stage 4 chronic kidney disease, or unspecified chronic kidney disease: Secondary | ICD-10-CM | POA: Diagnosis not present

## 2021-04-08 DIAGNOSIS — J45909 Unspecified asthma, uncomplicated: Secondary | ICD-10-CM | POA: Diagnosis not present

## 2021-04-08 DIAGNOSIS — G8929 Other chronic pain: Secondary | ICD-10-CM | POA: Diagnosis not present

## 2021-04-08 DIAGNOSIS — D649 Anemia, unspecified: Secondary | ICD-10-CM | POA: Diagnosis not present

## 2021-04-10 DIAGNOSIS — R3129 Other microscopic hematuria: Secondary | ICD-10-CM | POA: Diagnosis not present

## 2021-04-10 DIAGNOSIS — R809 Proteinuria, unspecified: Secondary | ICD-10-CM | POA: Diagnosis not present

## 2021-04-10 DIAGNOSIS — N1832 Chronic kidney disease, stage 3b: Secondary | ICD-10-CM | POA: Diagnosis not present

## 2021-04-10 DIAGNOSIS — R7303 Prediabetes: Secondary | ICD-10-CM | POA: Diagnosis not present

## 2021-04-10 DIAGNOSIS — I129 Hypertensive chronic kidney disease with stage 1 through stage 4 chronic kidney disease, or unspecified chronic kidney disease: Secondary | ICD-10-CM | POA: Diagnosis not present

## 2021-04-10 DIAGNOSIS — R600 Localized edema: Secondary | ICD-10-CM | POA: Diagnosis not present

## 2021-04-10 DIAGNOSIS — E871 Hypo-osmolality and hyponatremia: Secondary | ICD-10-CM | POA: Diagnosis not present

## 2021-04-10 DIAGNOSIS — N281 Cyst of kidney, acquired: Secondary | ICD-10-CM | POA: Diagnosis not present

## 2021-04-17 ENCOUNTER — Telehealth: Payer: Self-pay | Admitting: *Deleted

## 2021-04-17 NOTE — Telephone Encounter (Signed)
Labs received from:[facility]  Drawn on:04/10/2021  Reviewed by:Hazel Sams, PA-C  Labs drawn:Renal Function Panel  Results:Glucose 102   BUN 28   Creat. 1.44   GFR 36   Carbon Dioxide 32   Phosphorus 2.5   Magnesium 1.5   Hgb 10.9

## 2021-04-22 NOTE — Telephone Encounter (Signed)
Error

## 2021-04-23 NOTE — Telephone Encounter (Signed)
Error

## 2021-04-25 ENCOUNTER — Other Ambulatory Visit: Payer: Self-pay | Admitting: Rheumatology

## 2021-04-29 ENCOUNTER — Other Ambulatory Visit: Payer: Self-pay | Admitting: Rheumatology

## 2021-04-29 ENCOUNTER — Other Ambulatory Visit: Payer: Self-pay | Admitting: Internal Medicine

## 2021-04-30 NOTE — Telephone Encounter (Signed)
Please refill as per office routine med refill policy (all routine meds refilled for 3 mo or monthly per pt preference up to one year from last visit, then month to month grace period for 3 mo, then further med refills will have to be denied)  

## 2021-04-30 NOTE — Telephone Encounter (Signed)
Please advise. Medication showing as being prescribed by historical provider.

## 2021-04-30 NOTE — Telephone Encounter (Signed)
    Patient also requesting refill for diclofenac Sodium (VOLTAREN) 1 % GEL  ADVAIR DISKUS 250-50 MCG/DOSE AEPB

## 2021-05-01 ENCOUNTER — Other Ambulatory Visit: Payer: Self-pay | Admitting: Internal Medicine

## 2021-05-01 ENCOUNTER — Other Ambulatory Visit: Payer: Self-pay | Admitting: Rheumatology

## 2021-05-02 NOTE — Telephone Encounter (Signed)
Please refill as per office routine med refill policy (all routine meds refilled for 3 mo or monthly per pt preference up to one year from last visit, then month to month grace period for 3 mo, then further med refills will have to be denied)  

## 2021-05-02 NOTE — Addendum Note (Signed)
Addended by: Terence Lux A on: 05/02/2021 11:22 AM   Modules accepted: Orders

## 2021-05-02 NOTE — Telephone Encounter (Signed)
Please advise on quantity and number of refills for Diclofenac gel

## 2021-05-05 ENCOUNTER — Telehealth: Payer: Self-pay

## 2021-05-05 MED ORDER — MAGNESIUM GLUCONATE 500 (27 MG) MG PO TABS: 1.0000 | ORAL_TABLET | Freq: Two times a day (BID) | ORAL | 2 refills | Status: DC

## 2021-05-05 NOTE — Telephone Encounter (Signed)
Refill request Diclofenac sodium gel and Magnesium.

## 2021-05-05 NOTE — Telephone Encounter (Signed)
Refll for Diclofenac gel. Faxed request back with approval.

## 2021-05-08 MED ORDER — DICLOFENAC SODIUM 1 % EX GEL: CUTANEOUS | 5 refills | Status: DC

## 2021-05-08 MED ORDER — FLUTICASONE-SALMETEROL 250-50 MCG/ACT IN AEPB: 1.0000 | INHALATION_SPRAY | Freq: Two times a day (BID) | RESPIRATORY_TRACT | 3 refills | Status: DC

## 2021-05-13 ENCOUNTER — Ambulatory Visit: Payer: Medicare HMO

## 2021-05-15 ENCOUNTER — Ambulatory Visit: Payer: Medicare HMO

## 2021-05-19 ENCOUNTER — Other Ambulatory Visit: Payer: Self-pay | Admitting: Internal Medicine

## 2021-05-20 ENCOUNTER — Ambulatory Visit (INDEPENDENT_AMBULATORY_CARE_PROVIDER_SITE_OTHER): Payer: Medicare HMO

## 2021-05-20 ENCOUNTER — Other Ambulatory Visit: Payer: Self-pay

## 2021-05-20 VITALS — BP 140/80 | HR 86 | Temp 98.0°F | Ht 61.0 in | Wt 157.4 lb

## 2021-05-20 DIAGNOSIS — Z0000 Encounter for general adult medical examination without abnormal findings: Secondary | ICD-10-CM | POA: Diagnosis not present

## 2021-05-20 NOTE — Patient Instructions (Signed)
Ms. Buske , Thank you for taking time to come for your Medicare Wellness Visit. I appreciate your ongoing commitment to your health goals. Please review the following plan we discussed and let me know if I can assist you in the future.   Screening recommendations/referrals: Colonoscopy: no longer recommended due to age Mammogram: 01/20/2021; normal results Bone Density: no longer recommended Recommended yearly ophthalmology/optometry visit for glaucoma screening and checkup Recommended yearly dental visit for hygiene and checkup  Vaccinations: Influenza vaccine: 11/27/2020 Pneumococcal vaccine: 10/26/2005, 02/20/2014 Tdap vaccine: 11/22/2017; due every 10 years Shingles vaccine: never done   Covid-19: 12/11/2019, 01/02/2020  Advanced directives: Please bring a copy of your health care power of attorney and living will to the office at your convenience.  Conditions/risks identified: Yes; Client understands the importance of follow-up with providers by attending scheduled visits and discussed goals to eat healthier, increase physical activity, exercise the brain, socialize more,  get enough rest and make time for laughter.  Next appointment: Please schedule your next Medicare Wellness Visit with your Nurse Health Advisor in 1 year by calling [phone].   Preventive Care 83 Years and Older, Female Preventive care refers to lifestyle choices and visits with your health care provider that can promote health and wellness. What does preventive care include? A yearly physical exam. This is also called an annual well check. Dental exams once or twice a year. Routine eye exams. Ask your health care provider how often you should have your eyes checked. Personal lifestyle choices, including: Daily care of your teeth and gums. Regular physical activity. Eating a healthy diet. Avoiding tobacco and drug use. Limiting alcohol use. Practicing safe sex. Taking low-dose aspirin every day. Taking vitamin  and mineral supplements as recommended by your health care provider. What happens during an annual well check? The services and screenings done by your health care provider during your annual well check will depend on your age, overall health, lifestyle risk factors, and family history of disease. Counseling  Your health care provider may ask you questions about your: Alcohol use. Tobacco use. Drug use. Emotional well-being. Home and relationship well-being. Sexual activity. Eating habits. History of falls. Memory and ability to understand (cognition). Work and work Statistician. Reproductive health. Screening  You may have the following tests or measurements: Height, weight, and BMI. Blood pressure. Lipid and cholesterol levels. These may be checked every 5 years, or more frequently if you are over 55 years old. Skin check. Lung cancer screening. You may have this screening every year starting at age 86 if you have a 30-pack-year history of smoking and currently smoke or have quit within the past 15 years. Fecal occult blood test (FOBT) of the stool. You may have this test every year starting at age 36. Flexible sigmoidoscopy or colonoscopy. You may have a sigmoidoscopy every 5 years or a colonoscopy every 10 years starting at age 41. Hepatitis C blood test. Hepatitis B blood test. Sexually transmitted disease (STD) testing. Diabetes screening. This is done by checking your blood sugar (glucose) after you have not eaten for a while (fasting). You may have this done every 1-3 years. Bone density scan. This is done to screen for osteoporosis. You may have this done starting at age 54. Mammogram. This may be done every 1-2 years. Talk to your health care provider about how often you should have regular mammograms. Talk with your health care provider about your test results, treatment options, and if necessary, the need for more tests. Vaccines  Your health care provider may recommend  certain vaccines, such as: Influenza vaccine. This is recommended every year. Tetanus, diphtheria, and acellular pertussis (Tdap, Td) vaccine. You may need a Td booster every 10 years. Zoster vaccine. You may need this after age 6. Pneumococcal 13-valent conjugate (PCV13) vaccine. One dose is recommended after age 29. Pneumococcal polysaccharide (PPSV23) vaccine. One dose is recommended after age 22. Talk to your health care provider about which screenings and vaccines you need and how often you need them. This information is not intended to replace advice given to you by your health care provider. Make sure you discuss any questions you have with your health care provider. Document Released: 11/08/2015 Document Revised: 07/01/2016 Document Reviewed: 08/13/2015 Elsevier Interactive Patient Education  2017 [employer] Prevention in the Home Falls can cause injuries. They can happen to people of all ages. There are many things you can do to make your home safe and to help prevent falls. What can I do on the outside of my home? Regularly fix the edges of walkways and driveways and fix any cracks. Remove anything that might make you trip as you walk through a door, such as a raised step or threshold. Trim any bushes or trees on the path to your home. Use bright outdoor lighting. Clear any walking paths of anything that might make someone trip, such as rocks or tools. Regularly check to see if handrails are loose or broken. Make sure that both sides of any steps have handrails. Any raised decks and porches should have guardrails on the edges. Have any leaves, snow, or ice cleared regularly. Use sand or salt on walking paths during winter. Clean up any spills in your garage right away. This includes oil or grease spills. What can I do in the bathroom? Use night lights. Install grab bars by the toilet and in the tub and shower. Do not use towel bars as grab bars. Use non-skid mats or  decals in the tub or shower. If you need to sit down in the shower, use a plastic, non-slip stool. Keep the floor dry. Clean up any water that spills on the floor as soon as it happens. Remove soap buildup in the tub or shower regularly. Attach bath mats securely with double-sided non-slip rug tape. Do not have throw rugs and other things on the floor that can make you trip. What can I do in the bedroom? Use night lights. Make sure that you have a light by your bed that is easy to reach. Do not use any sheets or blankets that are too big for your bed. They should not hang down onto the floor. Have a firm chair that has side arms. You can use this for support while you get dressed. Do not have throw rugs and other things on the floor that can make you trip. What can I do in the kitchen? Clean up any spills right away. Avoid walking on wet floors. Keep items that you use a lot in easy-to-reach places. If you need to reach something above you, use a strong step stool that has a grab bar. Keep electrical cords out of the way. Do not use floor polish or wax that makes floors slippery. If you must use wax, use non-skid floor wax. Do not have throw rugs and other things on the floor that can make you trip. What can I do with my stairs? Do not leave any items on the stairs. Make sure that there are  handrails on both sides of the stairs and use them. Fix handrails that are broken or loose. Make sure that handrails are as long as the stairways. Check any carpeting to make sure that it is firmly attached to the stairs. Fix any carpet that is loose or worn. Avoid having throw rugs at the top or bottom of the stairs. If you do have throw rugs, attach them to the floor with carpet tape. Make sure that you have a light switch at the top of the stairs and the bottom of the stairs. If you do not have them, ask someone to add them for you. What else can I do to help prevent falls? Wear shoes that: Do not  have high heels. Have rubber bottoms. Are comfortable and fit you well. Are closed at the toe. Do not wear sandals. If you use a stepladder: Make sure that it is fully opened. Do not climb a closed stepladder. Make sure that both sides of the stepladder are locked into place. Ask someone to hold it for you, if possible. Clearly mark and make sure that you can see: Any grab bars or handrails. First and last steps. Where the edge of each step is. Use tools that help you move around (mobility aids) if they are needed. These include: Canes. Walkers. Scooters. Crutches. Turn on the lights when you go into a dark area. Replace any light bulbs as soon as they burn out. Set up your furniture so you have a clear path. Avoid moving your furniture around. If any of your floors are uneven, fix them. If there are any pets around you, be aware of where they are. Review your medicines with your doctor. Some medicines can make you feel dizzy. This can increase your chance of falling. Ask your doctor what other things that you can do to help prevent falls. This information is not intended to replace advice given to you by your health care provider. Make sure you discuss any questions you have with your health care provider. Document Released: 08/08/2009 Document Revised: 03/19/2016 Document Reviewed: 11/16/2014 Elsevier Interactive Patient Education  2017 [employer].

## 2021-05-20 NOTE — Progress Notes (Signed)
Subjective:   [name] is a 85 y.o. female who presents for Medicare Annual (Subsequent) preventive examination.  Review of Systems     Cardiac Risk Factors include: advanced age (>36mn, >>53women);hypertension     Objective:    Today's Vitals   05/20/21 0918  BP: 140/80  Pulse: 86  Temp: 98 F (36.7 C)  SpO2: 91%  Weight: 157 lb 6.4 oz (71.4 kg)  Height: 5' 1" (1.549 m)  PainSc: 0-No pain   Body mass index is 29.74 kg/m.  Advanced Directives 05/20/2021 02/28/2018 12/27/2017 12/26/2017 12/23/2017 08/17/2017 09/22/2016  Does Patient Have a Medical Advance Directive? _0  Yes Yes  Type of Advance Directive Living will;Healthcare Power of Attorney Living will [place] - [place] -  Does patient want to make changes to medical advance directive? No - Patient declined - No - Patient declined Yes (ED - Information included in AVS) No - Patient declined - -  Copy of [place] Chart? No - copy requested - No - copy requested - No - copy requested No - copy requested -    Current Medications (verified) Outpatient Encounter Medications as of 05/20/2021  Medication Sig   ADVAIR DISKUS 250-50 MCG/DOSE AEPB INHALE 1 PUFF BY MOUTH TWICE A DAY   albuterol (PROVENTIL HFA;VENTOLIN HFA) 108 (90 Base) MCG/ACT inhaler Inhale 2 puffs into the lungs every 6 (six) hours as needed for wheezing or shortness of breath.   Albuterol Sulfate (PROAIR RESPICLICK) 1160(90 Base) MCG/ACT AEPB 1 puff as needed   amLODipine (NORVASC) 5 MG tablet TAKE 1 TABLET BY MOUTH EVERY DAY   aspirin 81 MG tablet Take 81 mg by mouth daily.   ASPIRIN 81 PO 1 tablet   diclofenac Sodium (VOLTAREN) 1 % GEL APPLY 2 GRAMS TO AFFECTED AREA 4 TIMES A DAY   Ensure (ENSURE) Take 1 Can by mouth 3 (three) times daily between meals.   ergocalciferol (VITAMIN D2) 1.25 MG (50000 UT) capsule  1 capsule   FERREX 150 150 MG capsule TAKE 1 CAPSULE BY MOUTH TWICE A DAY   fluticasone-salmeterol (ADVAIR DISKUS) 250-50 MCG/ACT AEPB Inhale 1 puff into the lungs in the morning and at bedtime.   furosemide (LASIX) 20 MG tablet TAKE 1 TABLET BY MOUTH EVERY DAY AS NEEDED   gabapentin (NEURONTIN) 300 MG capsule TAKE 1 CAPSULE BY MOUTH THREE TIMES A DAY   hydrOXYzine (VISTARIL) 50 MG capsule 1 capsule as needed   Incontinence Supply Disposable (DEPEND UNDERWEAR SM/MED) MISC Use as directed four times per day   losartan (COZAAR) 100 MG tablet Take 1 tablet by mouth daily.   magnesium gluconate (MAGONATE) 500 MG tablet Take 500 mg by mouth 2 (two) times daily.   Magnesium Gluconate 500 (27 Mg) MG TABS Take 1 tablet (500 mg total) by mouth 2 (two) times daily.   meclizine (ANTIVERT) 12.5 MG tablet TAKE 1 TABLET BY MOUTH THREE TIMES A DAY AS NEEDED FOR DIZZINESS   methocarbamol (ROBAXIN) 500 MG tablet Take 1 tablet (500 mg total) by mouth every 8 (eight) hours as needed for muscle spasms.   MOMETASONE-AMMONIUM LACTATE EX mometasone   montelukast (SINGULAIR) 10 MG tablet TAKE 1 TABLET BY MOUTH EVERY DAY   Multiple Vitamins-Minerals (CENTRUM SILVER PO) Take 1 tablet by mouth daily.   pantoprazole (PROTONIX) 40 MG tablet TAKE 1 TABLET BY MOUTH EVERY DAY   potassium chloride (  KLOR-CON) 10 MEQ tablet TAKE 1 TABLET BY MOUTH EVERY DAY WHEN TAKING FUROSEMIDE   Potassium Gluconate 550 MG TABS 1 tablet   predniSONE (DELTASONE) 1 MG tablet Take 2 tablets (2 mg total) by mouth daily.   predniSONE (DELTASONE) 5 MG tablet Take 1 tablet (5 mg total) by mouth daily with breakfast.   Simethicone (GAS-X PO) Take 1 tablet by mouth daily as needed (for gas).    solifenacin (VESICARE) 5 MG tablet Take 1 tablet (5 mg total) by mouth daily.   tizanidine (ZANAFLEX) 2 MG capsule TAKE 1 CAPSULE BY MOUTH 3 TIMES A DAY AS NEEDED FOR MUSCLE SPASMS   traMADol (ULTRAM-ER) 200 MG 24 hr tablet TAKE 1 TABLET BY MOUTH EVERY DAY    triamcinolone (KENALOG) 0.1 % Apply 1 application topically 2 (two) times daily.   triamcinolone (NASACORT AQ) 55 MCG/ACT AERO nasal inhaler Place 2 sprays into the nose daily.   No facility-administered encounter medications on file as of 05/20/2021.    Allergies (verified) Hydrocodone, Tizanidine, and Iron   History: Past Medical History:  Diagnosis Date   Allergic rhinitis 10/30/2016   Anemia    Asthma    Breast cancer of upper-outer quadrant of left female breast ([place]) 11/08/2013   ER/PR+ Her2- Left IDC    Chronic renal insufficiency    Chronic rhinitis    Colon polyp    Diastolic dysfunction 1/61/0960   DJD (degenerative joint disease)    Dyspnea    Full dentures    GERD (gastroesophageal reflux disease)    Hearing loss    Hypertension    Hyponatremia    Impaired glucose tolerance 07/18/2014   Memory loss    Morbid obesity ([place])    Poor circulation    Vertigo    Wears glasses    Past Surgical History:  Procedure Laterality Date   ABDOMINAL HYSTERECTOMY     BREAST LUMPECTOMY WITH NEEDLE LOCALIZATION AND AXILLARY SENTINEL LYMPH NODE BX Left 12/04/2013   Procedure: BREAST LUMPECTOMY WITH NEEDLE LOCALIZATION AND AXILLARY SENTINEL LYMPH NODE BX;  Surgeon: Shann Medal, MD;  Location: [place];  Service: General;  Laterality: Left;   CATARACT EXTRACTION  2009   rt   COLONOSCOPY     EYE SURGERY Bilateral    cataract surgery   KNEE ARTHROSCOPY     both   LUMBAR LAMINECTOMY/DECOMPRESSION MICRODISCECTOMY Left 12/23/2017   Procedure: Left Lumbar One-Two Laminectomy with microdiscectomy;  Surgeon: Eustace Moore, MD;  Location: [place];  Service: Neurosurgery;  Laterality: Left;  Left L1-2 Laminectomy with microdiscectomy   TONSILLECTOMY     TOTAL KNEE ARTHROPLASTY  2002   rt   TOTAL KNEE ARTHROPLASTY  2003   left   VESICOVAGINAL FISTULA CLOSURE W/ TAH  1980   Family History  Problem Relation Age of Onset   Colon cancer Mother        in her 11's   Stomach  cancer Mother    Lung cancer Brother        was a smoker   Heart attack Son 18   Healthy Daughter    Social History   Socioeconomic History   Marital status: Widowed    Spouse name: Not on file   Number of children: 2   Years of education: Not on file   Highest education level: Not on file  Occupational History   Occupation: owns bakery and works PT for [employer]  Tobacco Use   Smoking status: Never  Smokeless tobacco: Never  Vaping Use   Vaping Use: Never used  Substance and Sexual Activity   Alcohol use: No    Alcohol/week: 0.0 standard drinks   Drug use: No   Sexual activity: Not Currently  Other Topics Concern   Not on file  Social History Narrative   Not on file   Social Determinants of Health   Financial Resource Strain: Low Risk    Difficulty of Paying Living Expenses: Not hard at all  Food Insecurity: No Food Insecurity   Worried About Charity fundraiser in the Last Year: Never true   [place] in the Last Year: Never true  Transportation Needs: No Transportation Needs   Lack of Transportation (Medical): No   Lack of Transportation (Non-Medical): No  Physical Activity: Inactive   Days of Exercise per Week: 0 days   Minutes of Exercise per Session: 0 min  Stress: No Stress Concern Present   Feeling of Stress : Not at all  Social Connections: Moderately Integrated   Frequency of Communication with Friends and Family: More than three times a week   Frequency of Social Gatherings with Friends and Family: More than three times a week   Attends Religious Services: 1 to 4 times per year   Active Member of [employer] or Organizations: Yes   Attends Archivist Meetings: 1 to 4 times per year   Marital Status: Widowed    Tobacco Counseling Counseling given: Not Answered   Clinical Intake:  Pre-visit preparation completed: Yes  Pain : No/denies pain Pain Score: 0-No pain     BMI - recorded: 29.74 Nutritional Status: BMI 25 -29  Overweight Nutritional Risks: None Diabetes: No  How often do you need to have someone help you when you read instructions, pamphlets, or other written materials from your doctor or pharmacy?: 1 - Never What is the last grade level you completed in school?: High School Graduate  Diabetic? no  Interpreter Needed?: No  Information entered by :: Lisette Abu, LPN   Activities of Daily Living In your present state of health, do you have any difficulty performing the following activities: 05/20/2021 11/27/2020  Hearing? N Y  Vision? N Y  Difficulty concentrating or making decisions? N N  Walking or climbing stairs? N Y  Dressing or bathing? N Y  Doing errands, shopping? N Y  Conservation officer, nature and eating ? N -  Using the Toilet? N -  In the past six months, have you accidently leaked urine? Y -  Do you have problems with loss of bowel control? Y -  Managing your Medications? N -  Managing your Finances? N -  Housekeeping or managing your Housekeeping? N -  Some recent data might be hidden    Patient Care Team: Biagio Borg, MD as PCP - General (Internal Medicine) Magrinat, [name], MD as Consulting Physician (Oncology) [name], MD as Consulting Physician (Radiation Oncology) [name], MD as Consulting Physician (Obstetrics and Gynecology) [name], FNP as Nurse Practitioner (Psychiatry) [name], MD as Consulting Physician (Radiology)  Indicate any recent Medical Services you may have received from other than [facility] providers in the past year (date may be approximate).     Assessment:   This is a routine wellness examination for [place].  Hearing/Vision screen Hearing Screening - Comments:: Patient denied any hearing difficulty. Vision Screening - Comments:: Patient wears glasses.  [facility] exam done by [facility].  Dietary issues and exercise  activities discussed: Current Exercise Habits: The patient does not participate in  regular exercise at present, Exercise limited by: cardiac condition(s);respiratory conditions(s);orthopedic condition(s)   Goals Addressed   None   Depression Screen PHQ 2/9 Scores 05/20/2021 01/24/2021 01/24/2021 07/31/2020 07/17/2020 06/13/2020 12/07/2019  PHQ - 2 Score 0 0 0 0 0 0 0  PHQ- 9 Score - - - - - 5 -    Fall Risk Fall Risk  05/20/2021 01/24/2021 01/24/2021 11/27/2020 07/31/2020  Falls in the past year? 1 0 0 0 0  Number falls in past yr: 0 - 0 0 0  Injury with Fall? 0 - 0 0 0  Risk for fall due to : History of fall(s);Impaired balance/gait - - Impaired balance/gait Impaired mobility  Follow up Falls evaluation completed - - - Falls evaluation completed    FALL RISK PREVENTION PERTAINING TO THE HOME:  Any stairs in or around the home? No  If so, are there any without handrails? No  Home free of loose throw rugs in walkways, pet beds, electrical cords, etc? Yes  Adequate lighting in your home to reduce risk of falls? Yes   ASSISTIVE DEVICES UTILIZED TO PREVENT FALLS:  Life alert? Yes  Use of a cane, walker or w/c? Yes  Grab bars in the bathroom? No  Shower chair or bench in shower? Yes  Elevated toilet seat or a handicapped toilet? Yes   TIMED UP AND GO:  Was the test performed? Yes .  Length of time to ambulate 10 feet: 13 sec.   Gait slow and steady with assistive device  Cognitive Function: MMSE - Mini Mental State Exam 08/17/2017  Orientation to time 5  Orientation to Place 5  Registration 3  Attention/ Calculation 3  Recall 2  Language- name 2 objects 2  Language- repeat 1  Language- follow 3 step command 3  Language- read & follow direction 1  Write a sentence 1  Copy design 1  Total score 27        Immunizations Immunization History  Administered Date(s) Administered   Fluad Quad(high Dose 65+) 11/27/2020   Influenza Split 08/07/2011, 10/13/2012   Influenza Whole 07/27/2008, 08/14/2009, 07/01/2010   Influenza, High Dose Seasonal PF 07/10/2016,  08/10/2017, 08/30/2018, 09/08/2019   Influenza,inj,Quad PF,6+ Mos 08/28/2013, 07/18/2014, 07/26/2015   PFIZER(Purple Top)SARS-COV-2 Vaccination 12/11/2019, 01/02/2020   Pneumococcal Conjugate-13 02/20/2014   Pneumococcal Polysaccharide-23 10/26/2005   Tdap 11/22/2017    TDAP status: Up to date  Flu Vaccine status: Up to date  Pneumococcal vaccine status: Up to date  Covid-19 vaccine status: Completed vaccines  Qualifies for Shingles Vaccine? Yes   Zostavax completed No   Shingrix Completed?: No.    Education has been provided regarding the importance of this vaccine. Patient has been advised to call insurance company to determine out of pocket expense if they have not yet received this vaccine. Advised may also receive vaccine at local pharmacy or Health Dept. Verbalized acceptance and understanding.  Screening Tests Health Maintenance  Topic Date Due   Zoster Vaccines- Shingrix (1 of 2) Never done   COVID-19 Vaccine (3 - Pfizer risk series) 01/30/2020   INFLUENZA VACCINE  05/26/2021   TETANUS/TDAP  11/23/2027   DEXA SCAN  Completed   PNA vac Low Risk Adult  Completed   HPV VACCINES  Aged Out    Health Maintenance  Health Maintenance Due  Topic Date Due   Zoster Vaccines- Shingrix (1 of 2) Never done   COVID-19 Vaccine (3 - [employer]  risk series) 01/30/2020    Colorectal cancer screening: No longer required.   Mammogram status: Completed 01/20/2021. Repeat every year  Bone density status: No longer recommended  Lung Cancer Screening: (Low Dose CT Chest recommended if Age 19-80 years, 30 pack-year currently smoking OR have quit w/in 15years.) does not qualify.   Lung Cancer Screening Referral: no  Additional Screening:  Hepatitis C Screening: does not qualify; Completed no  Vision Screening: Recommended annual ophthalmology exams for early detection of glaucoma and other disorders of the eye. Is the patient up to date with their annual eye exam?  Yes  Who is the  provider or what is the name of the office in which the patient attends annual eye exams? [facility] If pt is not established with a provider, would they like to be referred to a provider to establish care? No .   Dental Screening: Recommended annual dental exams for proper oral hygiene  Community Resource Referral / Chronic Care Management: CRR required this visit?  No   CCM required this visit?  No      Plan:     I have personally reviewed and noted the following in the patient's chart:   Medical and social history Use of alcohol, tobacco or illicit drugs  Current medications and supplements including opioid prescriptions.  Functional ability and status Nutritional status Physical activity Advanced directives List of other physicians Hospitalizations, surgeries, and ER visits in previous 12 months Vitals Screenings to include cognitive, depression, and falls Referrals and appointments  In addition, I have reviewed and discussed with patient certain preventive protocols, quality metrics, and best practice recommendations. A written personalized care plan for preventive services as well as general preventive health recommendations were provided to patient.     Sheral Flow, LPN   [id number]/[id number]   Nurse Notes: n/a

## 2021-06-06 DIAGNOSIS — H903 Sensorineural hearing loss, bilateral: Secondary | ICD-10-CM | POA: Diagnosis not present

## 2021-06-06 NOTE — Progress Notes (Deleted)
Office Visit Note  Patient: [name]             Date of Birth: 1936-02-16           MRN: [id number]             PCP: Biagio Borg, MD Referring: Biagio Borg, MD Visit Date: 06/19/2021 Occupation: _0 @  Subjective:  No chief complaint on file.   History of Present Illness: [name] is a 85 y.o. female ***   Activities of Daily Living:  Patient reports morning stiffness for *** {minute/hour:19697}.   Patient {ACTIONS;DENIES/REPORTS:21021675::"Denies"} nocturnal pain.  Difficulty dressing/grooming: {ACTIONS;DENIES/REPORTS:21021675::"Denies"} Difficulty climbing stairs: {ACTIONS;DENIES/REPORTS:21021675::"Denies"} Difficulty getting out of chair: {ACTIONS;DENIES/REPORTS:21021675::"Denies"} Difficulty using hands for taps, buttons, cutlery, and/or writing: {ACTIONS;DENIES/REPORTS:21021675::"Denies"}  No Rheumatology ROS completed.   PMFS History:  Patient Active Problem List   Diagnosis Date Noted   Calcium pyrophosphate deposition disease 02/12/2021   Fibromyalgia 02/12/2021   Polymyalgia rheumatica ([place]) 02/12/2021   Inflammatory arthritis 02/12/2021   Left knee pain 02/12/2021   Long term (current) use of systemic steroids 02/01/2021   Nail disorder 01/24/2021   Toe pain, right 01/24/2021   Chondrocalcinosis 12/19/2020   Primary osteoarthritis of both hands 12/19/2020   Pain in joint of left shoulder 08/22/2020   Pain in joint of right shoulder 08/22/2020   Shoulder pain 07/31/2020   Chronic pain 04/23/2020   Supraclavicular fossa fullness 04/23/2020   Finding of neck region 04/23/2020   Low grade fever 12/07/2019   OAB (overactive bladder) 12/07/2019   Pain of right heel 12/07/2019   Overactive bladder 12/07/2019   Diarrhea 04/13/2019   Abnormal gait 08/30/2018   Asthenia 08/30/2018   Conjunctivitis of left eye 04/13/2018   Rash 04/13/2018   Eruption at injection site 04/13/2018   Acute bilateral deep vein thrombosis (DVT) of  femoral veins (HCC) 01/31/2018   Hypokalemia 01/31/2018   Electrocardiogram abnormal 01/31/2018   Increased creatine kinase level 01/31/2018   Left leg pain 01/13/2018   Left leg swelling 01/13/2018   Hearing loss 01/13/2018   Intractable pain 12/27/2017   Arachnoiditis 12/27/2017   Pain 12/27/2017   Hyponatremia    S/P lumbar laminectomy 12/23/2017   History of lumbar laminectomy 12/23/2017   Wheezing 12/10/2017   Itching 11/22/2017   Urinary frequency 11/22/2017   Increased frequency of urination 11/22/2017   Degeneration of lumbar intervertebral disc 11/05/2017   Sciatica 09/04/2017   Asthma exacerbation 08/31/2017   Acute asthma 08/31/2017   Flare of rheumatoid arthritis ([place]) 05/11/2017   Articular gout 05/01/2017   Vertigo 10/30/2016   Allergic rhinitis 10/30/2016   Incontinence of feces 07/31/2016   Hemorrhoids 07/31/2016   Hematochezia 07/31/2016   Peripheral venous insufficiency 42/68/3419   Diastolic dysfunction 62/22/9798   Peripheral edema 07/10/2016   Left ankle pain 06/02/2016   Ankle pain 06/02/2016   Neck pain 03/10/2016   Low back pain 03/10/2016   Right cervical radiculopathy 11/13/2015   Bradycardia 03/15/2015   Joint pain 12/18/2014   Fever 11/27/2014   Impaired glucose tolerance 07/18/2014   Right lumbar radiculopathy 07/18/2014   Lumbar radiculopathy 07/18/2014   Encounter for well adult exam with abnormal findings 02/20/2014   Malignant neoplasm of upper-outer quadrant of female breast ([place]) 11/08/2013   Diverticular disease 07/09/2012   Headache(784.0) 06/30/2012   Anemia 06/29/2012   Dyspnea 03/11/2012   Cough 03/11/2012   CKD (chronic kidney disease) stage 4, GFR 15-29 ml/min (HCC) 05/18/2011   Gastroesophageal reflux disease 07/05/2008  VITAMIN D DEFICIENCY 12/19/2007   Vitamin D deficiency 12/19/2007   Morbid obesity ([place]) 12/16/2007   Essential hypertension 12/16/2007   DEGENERATIVE JOINT DISEASE 12/16/2007   Osteoarthritis  12/16/2007    Past Medical History:  Diagnosis Date   Allergic rhinitis 10/30/2016   Anemia    Asthma    Breast cancer of upper-outer quadrant of left female breast ([place]) 11/08/2013   ER/PR+ Her2- Left IDC    Chronic renal insufficiency    Chronic rhinitis    Colon polyp    Diastolic dysfunction 10/28/7251   DJD (degenerative joint disease)    Dyspnea    Full dentures    GERD (gastroesophageal reflux disease)    Hearing loss    Hypertension    Hyponatremia    Impaired glucose tolerance 07/18/2014   Memory loss    Morbid obesity ([place])    Poor circulation    Vertigo    Wears glasses     Family History  Problem Relation Age of Onset   Colon cancer Mother        in her 62's   Stomach cancer Mother    Lung cancer Brother        was a smoker   Heart attack Son 86   Healthy Daughter    Past Surgical History:  Procedure Laterality Date   ABDOMINAL HYSTERECTOMY     BREAST LUMPECTOMY WITH NEEDLE LOCALIZATION AND AXILLARY SENTINEL LYMPH NODE BX Left 12/04/2013   Procedure: BREAST LUMPECTOMY WITH NEEDLE LOCALIZATION AND AXILLARY SENTINEL LYMPH NODE BX;  Surgeon: Shann Medal, MD;  Location: [facility];  Service: General;  Laterality: Left;   CATARACT EXTRACTION  2009   rt   COLONOSCOPY     EYE SURGERY Bilateral    cataract surgery   KNEE ARTHROSCOPY     both   LUMBAR LAMINECTOMY/DECOMPRESSION MICRODISCECTOMY Left 12/23/2017   Procedure: Left Lumbar One-Two Laminectomy with microdiscectomy;  Surgeon: Eustace Moore, MD;  Location: [facility];  Service: Neurosurgery;  Laterality: Left;  Left L1-2 Laminectomy with microdiscectomy   TONSILLECTOMY     TOTAL KNEE ARTHROPLASTY  2002   rt   TOTAL KNEE ARTHROPLASTY  2003   left   VESICOVAGINAL FISTULA CLOSURE W/ TAH  1980   Social History   Social History Narrative   Not on file   Immunization History  Administered Date(s) Administered   Fluad Quad(high Dose 65+) 11/27/2020   Influenza Split 08/07/2011, 10/13/2012    Influenza Whole 07/27/2008, 08/14/2009, 07/01/2010   Influenza, High Dose Seasonal PF 07/10/2016, 08/10/2017, 08/30/2018, 09/08/2019   Influenza,inj,Quad PF,6+ Mos 08/28/2013, 07/18/2014, 07/26/2015   PFIZER(Purple Top)SARS-COV-2 Vaccination 12/11/2019, 01/02/2020   Pneumococcal Conjugate-13 02/20/2014   Pneumococcal Polysaccharide-23 10/26/2005   Tdap 11/22/2017     Objective: Vital Signs: There were no vitals taken for this visit.   Physical Exam   Musculoskeletal Exam: ***  CDAI Exam: CDAI Score: -- Patient Global: --; Provider Global: -- Swollen: --; Tender: -- Joint Exam 06/19/2021   No joint exam has been documented for this visit   There is currently no information documented on the homunculus. Go to the Rheumatology activity and complete the homunculus joint exam.  Investigation: No additional findings.  Imaging: No results found.  Recent Labs: Lab Results  Component Value Date   WBC 10.4 01/24/2021   HGB 11.2 (L) 01/24/2021   PLT 221 01/24/2021   NA 139 02/12/2021   K 3.5 02/12/2021   CL 96 02/12/2021   CO2 33 (  H) 02/12/2021   GLUCOSE 107 (H) 02/12/2021   BUN 43 (H) 02/12/2021   CREATININE 1.66 (H) 02/12/2021   BILITOT 0.4 01/24/2021   ALKPHOS 54 08/01/2020   AST 25 01/24/2021   ALT 24 01/24/2021   PROT 6.4 01/24/2021   ALBUMIN 3.7 08/01/2020   CALCIUM 10.0 02/12/2021   GFRAA 25 (L) 12/25/2020    Speciality Comments: No specialty comments available.  Procedures:  No procedures performed Allergies: Hydrocodone, Tizanidine, and Iron   Assessment / Plan:     Visit Diagnoses: No diagnosis found.  Orders: No orders of the defined types were placed in this encounter.  No orders of the defined types were placed in this encounter.   Face-to-face time spent with patient was *** minutes. Greater than 50% of time was spent in counseling and coordination of care.  Follow-Up Instructions: No follow-ups on file.   Earnestine Mealing, CMA  Note -  This record has been created using Editor, commissioning.  Chart creation errors have been sought, but may not always  have been located. Such creation errors do not reflect on  the standard of medical care.

## 2021-06-07 ENCOUNTER — Other Ambulatory Visit: Payer: Self-pay | Admitting: Rheumatology

## 2021-06-07 ENCOUNTER — Other Ambulatory Visit: Payer: Self-pay | Admitting: Internal Medicine

## 2021-06-09 NOTE — Telephone Encounter (Signed)
Next Visit: 06/19/2021  Last Visit: 12/19/2020  Last Fill: 03/10/2021  Dx: Chondrocalcinosis  Current Dose per office note on 12/19/2020: She is currently on prednisone 7 mg p.o. daily.  Okay to refill Prednisone?

## 2021-06-11 ENCOUNTER — Telehealth: Payer: Self-pay | Admitting: Internal Medicine

## 2021-06-11 NOTE — Telephone Encounter (Signed)
Patient is having trouble trying the laces of her shoes due to the weakness in her arms. Requesting an order for "diabetic" shoes that have the velcro straps instead.   Please advise.

## 2021-06-11 NOTE — Telephone Encounter (Signed)
Pt notified that we are unable to provider order for diabetic shoes since she does not have an dx of diabetes. Suggested pt get shoes that are slip on shoes & shoes with Velcro.  Pt verb understanding. Pt has appt on 8/24 & advised to keep appt to discuss peripheral edema.

## 2021-06-17 ENCOUNTER — Other Ambulatory Visit: Payer: Self-pay | Admitting: Rheumatology

## 2021-06-18 ENCOUNTER — Ambulatory Visit (INDEPENDENT_AMBULATORY_CARE_PROVIDER_SITE_OTHER): Payer: Medicare HMO

## 2021-06-18 ENCOUNTER — Other Ambulatory Visit: Payer: Self-pay

## 2021-06-18 ENCOUNTER — Ambulatory Visit: Payer: Medicare HMO | Admitting: Internal Medicine

## 2021-06-18 ENCOUNTER — Ambulatory Visit (INDEPENDENT_AMBULATORY_CARE_PROVIDER_SITE_OTHER): Payer: Medicare HMO | Admitting: Internal Medicine

## 2021-06-18 ENCOUNTER — Encounter: Payer: Self-pay | Admitting: Internal Medicine

## 2021-06-18 VITALS — BP 122/72 | HR 97 | Temp 99.1°F | Ht 61.0 in | Wt 153.0 lb

## 2021-06-18 DIAGNOSIS — N184 Chronic kidney disease, stage 4 (severe): Secondary | ICD-10-CM

## 2021-06-18 DIAGNOSIS — E871 Hypo-osmolality and hyponatremia: Secondary | ICD-10-CM

## 2021-06-18 DIAGNOSIS — I5031 Acute diastolic (congestive) heart failure: Secondary | ICD-10-CM | POA: Diagnosis not present

## 2021-06-18 DIAGNOSIS — R059 Cough, unspecified: Secondary | ICD-10-CM | POA: Diagnosis not present

## 2021-06-18 DIAGNOSIS — I10 Essential (primary) hypertension: Secondary | ICD-10-CM | POA: Diagnosis not present

## 2021-06-18 DIAGNOSIS — R7302 Impaired glucose tolerance (oral): Secondary | ICD-10-CM

## 2021-06-18 DIAGNOSIS — J4531 Mild persistent asthma with (acute) exacerbation: Secondary | ICD-10-CM

## 2021-06-18 DIAGNOSIS — R0989 Other specified symptoms and signs involving the circulatory and respiratory systems: Secondary | ICD-10-CM | POA: Diagnosis not present

## 2021-06-18 MED ORDER — FUROSEMIDE 40 MG PO TABS: 40.0000 mg | ORAL_TABLET | Freq: Every day | ORAL | 3 refills | Status: DC

## 2021-06-18 NOTE — Patient Instructions (Signed)
Ok to increase the lasix to 40 mg twice per day for 5 days, then cut back to once per day after that  Please continue all other medications as before, and refills have been done if requested.  Please have the pharmacy call with any other refills you may need.  Please continue your efforts at being more active, low cholesterol diet, and weight control.  Please keep your appointments with your specialists as you may have planned  Please go to the XRAY Department in the first floor for the x-ray testing  Please go to the LAB at the blood drawing area for the tests to be done  You will be contacted by phone if any changes need to be made immediately.  Otherwise, you will receive a letter about your results with an explanation, but please check with MyChart first.  Please remember to sign up for MyChart if you have not done so, as this will be important to you in the future with finding out test results, communicating by private email, and scheduling acute appointments online when needed.  Please make an Appointment to return in 1 week

## 2021-06-18 NOTE — Progress Notes (Signed)
Patient ID: [name], female   DOB: 08-10-1936, 85 y.o.   MRN: [id number]        Chief Complaint: follow up HTN, ckd, hyperglycemia, and leg swelling and dyspnea       HPI:  [name] is a 85 y.o. female here with c/o 1-2 wks worsening leg swelling but Pt denies chest pain, orthopnea, PND, palpitations, dizziness or syncope, but does also have mild intermittent wheezing where albuterol hfa prn not always working well.  Has run out of advair, needs refill.  Also has ongoing inflammatory arthritis not currently requiring daily prednisone.  . Pt denies polydipsia, polyuria, or new focal neuro s/s.   Pt denies fever, wt loss, night sweats, loss of appetite, or other constitutional symptoms , though has low grade temp tonight       Wt Readings from Last 3 Encounters:  06/18/21 153 lb (69.4 kg)  05/20/21 157 lb 6.4 oz (71.4 kg)  02/12/21 152 lb (68.9 kg)   BP Readings from Last 3 Encounters:  06/18/21 122/72  05/20/21 140/80  02/12/21 132/80         Past Medical History:  Diagnosis Date   Allergic rhinitis 10/30/2016   Anemia    Asthma    Breast cancer of upper-outer quadrant of left female breast ([place]) 11/08/2013   ER/PR+ Her2- Left IDC    Chronic renal insufficiency    Chronic rhinitis    Colon polyp    Diastolic dysfunction 2/83/6629   DJD (degenerative joint disease)    Dyspnea    Full dentures    GERD (gastroesophageal reflux disease)    Hearing loss    Hypertension    Hyponatremia    Impaired glucose tolerance 07/18/2014   Memory loss    Morbid obesity ([place])    Poor circulation    Vertigo    Wears glasses    Past Surgical History:  Procedure Laterality Date   ABDOMINAL HYSTERECTOMY     BREAST LUMPECTOMY WITH NEEDLE LOCALIZATION AND AXILLARY SENTINEL LYMPH NODE BX Left 12/04/2013   Procedure: BREAST LUMPECTOMY WITH NEEDLE LOCALIZATION AND AXILLARY SENTINEL LYMPH NODE BX;  Surgeon: Shann Medal, MD;  Location: [facility];  Service: General;   Laterality: Left;   CATARACT EXTRACTION  2009   rt   COLONOSCOPY     EYE SURGERY Bilateral    cataract surgery   KNEE ARTHROSCOPY     both   LUMBAR LAMINECTOMY/DECOMPRESSION MICRODISCECTOMY Left 12/23/2017   Procedure: Left Lumbar One-Two Laminectomy with microdiscectomy;  Surgeon: Eustace Moore, MD;  Location: [facility];  Service: Neurosurgery;  Laterality: Left;  Left L1-2 Laminectomy with microdiscectomy   TONSILLECTOMY     TOTAL KNEE ARTHROPLASTY  2002   rt   TOTAL KNEE ARTHROPLASTY  2003   left   VESICOVAGINAL FISTULA CLOSURE W/ TAH  1980    reports that she has never smoked. She has never used smokeless tobacco. She reports that she does not drink alcohol and does not use drugs. family history includes Colon cancer in her mother; Healthy in her daughter; Heart attack (age of onset: 72) in her son; Lung cancer in her brother; Stomach cancer in her mother. Allergies  Allergen Reactions   Hydrocodone Itching   Tizanidine Other (See Comments)    Dizzy and fall   Iron Hives and Other (See Comments)    [place], bad constipation Other reaction(s): Unknown   Current Outpatient Medications on File Prior to Visit  Medication Sig Dispense  Refill   albuterol (PROVENTIL HFA;VENTOLIN HFA) 108 (90 Base) MCG/ACT inhaler Inhale 2 puffs into the lungs every 6 (six) hours as needed for wheezing or shortness of breath. 1 Inhaler 11   Albuterol Sulfate (PROAIR RESPICLICK) 409 (90 Base) MCG/ACT AEPB 1 puff as needed     amLODipine (NORVASC) 5 MG tablet TAKE 1 TABLET BY MOUTH EVERY DAY 90 tablet 3   aspirin 81 MG tablet Take 81 mg by mouth daily.     ASPIRIN 81 PO 1 tablet     colchicine 0.6 MG tablet Take 0.3 mg by mouth daily.     diclofenac Sodium (VOLTAREN) 1 % GEL APPLY 2 GRAMS TO AFFECTED AREA 4 TIMES A DAY 100 g 5   Ensure (ENSURE) Take 1 Can by mouth 3 (three) times daily between meals. 237 mL 12   ergocalciferol (VITAMIN D2) 1.25 MG (50000 UT) capsule 1 capsule     FERREX 150 150 MG  capsule TAKE 1 CAPSULE BY MOUTH TWICE A DAY 60 capsule 1   fluticasone-salmeterol (ADVAIR DISKUS) 250-50 MCG/ACT AEPB Inhale 1 puff into the lungs in the morning and at bedtime. 180 each 3   gabapentin (NEURONTIN) 300 MG capsule TAKE 1 CAPSULE BY MOUTH THREE TIMES A DAY 90 capsule 5   hydrOXYzine (VISTARIL) 50 MG capsule 1 capsule as needed     Incontinence Supply Disposable (DEPEND UNDERWEAR SM/MED) MISC Use as directed four times per day 120 each 5   losartan (COZAAR) 100 MG tablet Take 1 tablet by mouth daily.     losartan-hydrochlorothiazide (HYZAAR) 100-25 MG tablet Take 1 tablet by mouth daily.     magnesium gluconate (MAGONATE) 500 MG tablet Take 500 mg by mouth 2 (two) times daily.     Magnesium Gluconate 500 (27 Mg) MG TABS Take 1 tablet (500 mg total) by mouth 2 (two) times daily. 90 tablet 2   meclizine (ANTIVERT) 12.5 MG tablet TAKE 1 TABLET BY MOUTH THREE TIMES A DAY AS NEEDED FOR DIZZINESS 90 tablet 2   methocarbamol (ROBAXIN) 500 MG tablet Take 1 tablet (500 mg total) by mouth every 8 (eight) hours as needed for muscle spasms. 10 tablet 0   MOMETASONE-AMMONIUM LACTATE EX mometasone     montelukast (SINGULAIR) 10 MG tablet TAKE 1 TABLET BY MOUTH EVERY DAY 90 tablet 1   Multiple Vitamins-Minerals (CENTRUM SILVER PO) Take 1 tablet by mouth daily.     pantoprazole (PROTONIX) 40 MG tablet TAKE 1 TABLET BY MOUTH EVERY DAY 90 tablet 1   potassium chloride (KLOR-CON) 10 MEQ tablet TAKE 1 TABLET BY MOUTH EVERY DAY WHEN TAKING FUROSEMIDE 90 tablet 3   Potassium Gluconate 550 MG TABS 1 tablet     Simethicone (GAS-X PO) Take 1 tablet by mouth daily as needed (for gas).      solifenacin (VESICARE) 5 MG tablet Take 1 tablet (5 mg total) by mouth daily. 90 tablet 3   tizanidine (ZANAFLEX) 2 MG capsule TAKE 1 CAPSULE BY MOUTH 3 TIMES A DAY AS NEEDED FOR MUSCLE SPASMS 40 capsule 1   traMADol (ULTRAM-ER) 200 MG 24 hr tablet TAKE 1 TABLET BY MOUTH EVERY DAY 90 tablet 1   triamcinolone (KENALOG) 0.1  % Apply 1 application topically 2 (two) times daily. 30 g 1   triamcinolone (NASACORT AQ) 55 MCG/ACT AERO nasal inhaler Place 2 sprays into the nose daily. 1 Inhaler 12   No current facility-administered medications on file prior to visit.        ROS:  All others reviewed and negative.  Objective        PE:  BP 122/72 (BP Location: Left Arm, Patient Position: Sitting, Cuff Size: Large)   Pulse 97   Temp 99.1 F (37.3 C) (Oral)   Ht _0  (1.549 m)   Wt 153 lb (69.4 kg)   SpO2 97%   BMI 28.91 kg/m                 Constitutional: Pt appears in NAD               HENT: Head: NCAT.                Right Ear: External ear normal.                 Left Ear: External ear normal.                Eyes: . Pupils are equal, round, and reactive to light. Conjunctivae and EOM are normal               Nose: without d/c or deformity               Neck: Neck supple. Gross normal ROM               Cardiovascular: Normal rate and regular rhythm.                 Pulmonary/Chest: Effort normal and breath sounds without rales or wheezing.                Abd:  Soft, NT, ND, + BS, no organomegaly               Neurological: Pt is alert. At baseline orientation, motor grossly intact               Skin: Skin is warm. No rashes, no other new lesions, LE edema - none               Psychiatric: Pt behavior is normal without agitation   Micro: none  Cardiac tracings I have personally interpreted today:  none  Pertinent Radiological findings (summarize): none   Lab Results  Component Value Date   WBC 7.1 06/18/2021   HGB 11.7 (L) 06/18/2021   HCT 34.8 (L) 06/18/2021   PLT 239.0 06/18/2021   GLUCOSE 103 (H) 06/18/2021   CHOL 219 (H) 01/24/2021   TRIG 134 01/24/2021   HDL 83 01/24/2021   LDLDIRECT 109.0 08/01/2020   LDLCALC 111 (H) 01/24/2021   ALT 28 06/18/2021   AST 39 (H) 06/18/2021   NA 137 06/18/2021   K 4.1 06/18/2021   CL 96 06/18/2021   CREATININE 1.60 (H) 06/18/2021   BUN 27 (H)  06/18/2021   CO2 30 06/18/2021   TSH 0.86 01/24/2021   INR 1.35 01/17/2018   HGBA1C 6.1 06/18/2021   Assessment/Plan:  [name] is a 85 y.o. Black or African American [2] female with  has a past medical history of Allergic rhinitis (10/30/2016), Anemia, Asthma, Breast cancer of upper-outer quadrant of left female breast ([place]) (11/08/2013), Chronic renal insufficiency, Chronic rhinitis, Colon polyp, Diastolic dysfunction (5/40/0867), DJD (degenerative joint disease), Dyspnea, Full dentures, GERD (gastroesophageal reflux disease), Hearing loss, Hypertension, Hyponatremia, Impaired glucose tolerance (07/18/2014), Memory loss, Morbid obesity ([place]), Poor circulation, Vertigo, and Wears glasses.  Acute diastolic congestive heart failure (HCC) Clinical dx, for increase lasix 40 bid x 5 days, then 40 qd, and f/u 1  wk  Impaired glucose tolerance Lab Results  Component Value Date   HGBA1C 6.1 06/18/2021   Stable, pt to continue current medical treatment  - diet   Hyponatremia Will need f/u lab on increased lasix next visit  Essential hypertension BP Readings from Last 3 Encounters:  06/18/21 122/72  05/20/21 140/80  02/12/21 132/80   Stable, pt to continue medical treatment norvasc, losartan   CKD (chronic kidney disease) stage 4, GFR 15-29 ml/min (HCC) Lab Results  Component Value Date   CREATININE 1.60 (H) 06/18/2021   Stable overall, cont to avoid nephrotoxins,, f/u renal as planned  Asthma exacerbation No wheezing today, but suspect mild worsening symptoms, for restart advair, also for cxr r/o underlying pna or chf  Followup: Return in about 1 week (around 06/25/2021).  Cathlean Cower, MD 06/21/2021 7:21 PM [facility]

## 2021-06-19 ENCOUNTER — Ambulatory Visit: Payer: Medicare HMO | Admitting: Rheumatology

## 2021-06-19 ENCOUNTER — Encounter: Payer: Self-pay | Admitting: Internal Medicine

## 2021-06-19 DIAGNOSIS — J4521 Mild intermittent asthma with (acute) exacerbation: Secondary | ICD-10-CM

## 2021-06-19 DIAGNOSIS — M19011 Primary osteoarthritis, right shoulder: Secondary | ICD-10-CM

## 2021-06-19 DIAGNOSIS — I82413 Acute embolism and thrombosis of femoral vein, bilateral: Secondary | ICD-10-CM

## 2021-06-19 DIAGNOSIS — M791 Myalgia, unspecified site: Secondary | ICD-10-CM

## 2021-06-19 DIAGNOSIS — K5790 Diverticulosis of intestine, part unspecified, without perforation or abscess without bleeding: Secondary | ICD-10-CM

## 2021-06-19 DIAGNOSIS — M79671 Pain in right foot: Secondary | ICD-10-CM

## 2021-06-19 DIAGNOSIS — M1120 Other chondrocalcinosis, unspecified site: Secondary | ICD-10-CM

## 2021-06-19 DIAGNOSIS — Z8719 Personal history of other diseases of the digestive system: Secondary | ICD-10-CM

## 2021-06-19 DIAGNOSIS — M5030 Other cervical disc degeneration, unspecified cervical region: Secondary | ICD-10-CM

## 2021-06-19 DIAGNOSIS — Z7952 Long term (current) use of systemic steroids: Secondary | ICD-10-CM

## 2021-06-19 DIAGNOSIS — I10 Essential (primary) hypertension: Secondary | ICD-10-CM

## 2021-06-19 DIAGNOSIS — I872 Venous insufficiency (chronic) (peripheral): Secondary | ICD-10-CM

## 2021-06-19 DIAGNOSIS — Z96653 Presence of artificial knee joint, bilateral: Secondary | ICD-10-CM

## 2021-06-19 DIAGNOSIS — M19041 Primary osteoarthritis, right hand: Secondary | ICD-10-CM

## 2021-06-19 DIAGNOSIS — M5136 Other intervertebral disc degeneration, lumbar region: Secondary | ICD-10-CM

## 2021-06-19 DIAGNOSIS — C50412 Malignant neoplasm of upper-outer quadrant of left female breast: Secondary | ICD-10-CM

## 2021-06-19 LAB — HEPATIC FUNCTION PANEL
ALT: 28 U/L (ref 0–35)
AST: 39 U/L — ABNORMAL HIGH (ref 0–37)
Albumin: 4 g/dL (ref 3.5–5.2)
Alkaline Phosphatase: 60 U/L (ref 39–117)
Bilirubin, Direct: 0 mg/dL (ref 0.0–0.3)
Total Bilirubin: 0.4 mg/dL (ref 0.2–1.2)
Total Protein: 7.1 g/dL (ref 6.0–8.3)

## 2021-06-19 LAB — CBC WITH DIFFERENTIAL/PLATELET
Basophils Absolute: 0.1 10*3/uL (ref 0.0–0.1)
Basophils Relative: 1 % (ref 0.0–3.0)
Eosinophils Absolute: 0 10*3/uL (ref 0.0–0.7)
Eosinophils Relative: 0.6 % (ref 0.0–5.0)
HCT: 34.8 % — ABNORMAL LOW (ref 36.0–46.0)
Hemoglobin: 11.7 g/dL — ABNORMAL LOW (ref 12.0–15.0)
Lymphocytes Relative: 12.6 % (ref 12.0–46.0)
Lymphs Abs: 0.9 10*3/uL (ref 0.7–4.0)
MCHC: 33.5 g/dL (ref 30.0–36.0)
MCV: 97.7 fl (ref 78.0–100.0)
Monocytes Absolute: 0.5 10*3/uL (ref 0.1–1.0)
Monocytes Relative: 7.2 % (ref 3.0–12.0)
Neutro Abs: 5.6 10*3/uL (ref 1.4–7.7)
Neutrophils Relative %: 78.6 % — ABNORMAL HIGH (ref 43.0–77.0)
Platelets: 239 10*3/uL (ref 150.0–400.0)
RBC: 3.56 Mil/uL — ABNORMAL LOW (ref 3.87–5.11)
RDW: 13.9 % (ref 11.5–15.5)
WBC: 7.1 10*3/uL (ref 4.0–10.5)

## 2021-06-19 LAB — BASIC METABOLIC PANEL
BUN: 27 mg/dL — ABNORMAL HIGH (ref 6–23)
CO2: 30 mEq/L (ref 19–32)
Calcium: 9.8 mg/dL (ref 8.4–10.5)
Chloride: 96 mEq/L (ref 96–112)
Creatinine, Ser: 1.6 mg/dL — ABNORMAL HIGH (ref 0.40–1.20)
GFR: 29.61 mL/min — ABNORMAL LOW (ref >60.00)
Glucose, Bld: 103 mg/dL — ABNORMAL HIGH (ref 70–99)
Potassium: 4.1 mEq/L (ref 3.5–5.1)
Sodium: 137 mEq/L (ref 135–145)

## 2021-06-19 LAB — HEMOGLOBIN A1C: Hgb A1c MFr Bld: 6.1 % (ref 4.6–6.5)

## 2021-06-19 LAB — BRAIN NATRIURETIC PEPTIDE: Pro B Natriuretic peptide (BNP): 56 pg/mL (ref 0.0–100.0)

## 2021-06-20 ENCOUNTER — Telehealth: Payer: Self-pay | Admitting: Internal Medicine

## 2021-06-20 DIAGNOSIS — I5031 Acute diastolic (congestive) heart failure: Secondary | ICD-10-CM

## 2021-06-20 DIAGNOSIS — J4531 Mild persistent asthma with (acute) exacerbation: Secondary | ICD-10-CM

## 2021-06-20 DIAGNOSIS — N184 Chronic kidney disease, stage 4 (severe): Secondary | ICD-10-CM

## 2021-06-20 NOTE — Telephone Encounter (Signed)
Patient says she is in need of in home health care  Patient says she has contacted insurance & it is covered under her policy but orders are needed from provider  Please follow up w/ patient [phone]

## 2021-06-21 ENCOUNTER — Encounter: Payer: Self-pay | Admitting: Internal Medicine

## 2021-06-21 NOTE — Assessment & Plan Note (Signed)
Will need f/u lab on increased lasix next visit

## 2021-06-21 NOTE — Assessment & Plan Note (Signed)
Clinical dx, for increase lasix 40 bid x 5 days, then 40 qd, and f/u 1 wk

## 2021-06-21 NOTE — Assessment & Plan Note (Signed)
No wheezing today, but suspect mild worsening symptoms, for restart advair, also for cxr r/o underlying pna or chf

## 2021-06-21 NOTE — Assessment & Plan Note (Signed)
Lab Results  Component Value Date   HGBA1C 6.1 06/18/2021   Stable, pt to continue current medical treatment  - diet

## 2021-06-21 NOTE — Assessment & Plan Note (Signed)
BP Readings from Last 3 Encounters:  06/18/21 122/72  05/20/21 140/80  02/12/21 132/80   Stable, pt to continue medical treatment norvasc, losartan

## 2021-06-21 NOTE — Assessment & Plan Note (Signed)
Lab Results  Component Value Date   CREATININE 1.60 (H) 06/18/2021   Stable overall, cont to avoid nephrotoxins,, f/u renal as planned

## 2021-06-22 ENCOUNTER — Other Ambulatory Visit: Payer: Self-pay | Admitting: Rheumatology

## 2021-06-23 NOTE — Telephone Encounter (Signed)
done

## 2021-06-23 NOTE — Telephone Encounter (Signed)
Ok referral done 

## 2021-06-24 NOTE — Telephone Encounter (Signed)
Patient notified and expresses her gratitude.

## 2021-06-25 ENCOUNTER — Other Ambulatory Visit: Payer: Self-pay | Admitting: Internal Medicine

## 2021-06-25 NOTE — Telephone Encounter (Signed)
Ok to let pt and family know  - this refill for prednisone I believe should be directed to Dr Estanislado Pandy rheumatology

## 2021-06-26 NOTE — Progress Notes (Signed)
Office Visit Note  Patient: [name]             Date of Birth: 01-04-1936           MRN: [id number]             PCP: Biagio Borg, MD Referring: Biagio Borg, MD Visit Date: 07/01/2021 Occupation: _0 @  Subjective:  Pain in multiple joints and muscles.   History of Present Illness: [name] is a 85 y.o. female with history of osteoarthritis and myalgias.  She has been on steroids for many years.  She states she tried to lower prednisone to 6 mg p.o. daily but she had to increase it due to increased pain and discomfort.  She is taking prednisone 7 mg p.o. daily which controlled her symptoms.  She does not want to decrease prednisone.  She also tried colchicine but could not tolerate the side effects.  Activities of Daily Living:  Patient reports morning stiffness for 1 hour.   Patient Reports nocturnal pain.  Difficulty dressing/grooming: Reports Difficulty climbing stairs: Reports Difficulty getting out of chair: Reports Difficulty using hands for taps, buttons, cutlery, and/or writing: Reports  Review of Systems  Constitutional:  Negative for fatigue.  HENT:  Positive for mouth dryness and nose dryness. Negative for mouth sores.   Eyes:  Positive for pain, itching and dryness.  Respiratory:  Positive for shortness of breath and difficulty breathing.   Cardiovascular:  Negative for chest pain and palpitations.  Gastrointestinal:  Positive for constipation. Negative for blood in stool and diarrhea.  Endocrine: Positive for excessive thirst. Negative for increased urination.  Genitourinary:  Negative for difficulty urinating.  Musculoskeletal:  Positive for joint pain, joint pain, joint swelling, myalgias, morning stiffness, muscle tenderness and myalgias.  Skin:  Negative for color change, rash and redness.  Allergic/Immunologic: Negative for susceptible to infections.  Neurological:  Positive for dizziness, headaches and memory loss. Negative for  numbness and weakness.  Hematological:  Negative for bruising/bleeding tendency.  Psychiatric/Behavioral:  Negative for confusion.    PMFS History:  Patient Active Problem List   Diagnosis Date Noted   Right hip pain 06/27/2021   Right knee pain 99/24/2683   Acute diastolic congestive heart failure ([place]) 06/18/2021   Calcium pyrophosphate deposition disease 02/12/2021   Fibromyalgia 02/12/2021   Polymyalgia rheumatica ([place]) 02/12/2021   Inflammatory arthritis 02/12/2021   Left knee pain 02/12/2021   Long term (current) use of systemic steroids 02/01/2021   Nail disorder 01/24/2021   Toe pain, right 01/24/2021   Chondrocalcinosis 12/19/2020   Primary osteoarthritis of both hands 12/19/2020   Pain in joint of left shoulder 08/22/2020   Pain in joint of right shoulder 08/22/2020   Shoulder pain 07/31/2020   Chronic pain 04/23/2020   Supraclavicular fossa fullness 04/23/2020   Finding of neck region 04/23/2020   Low grade fever 12/07/2019   OAB (overactive bladder) 12/07/2019   Pain of right heel 12/07/2019   Overactive bladder 12/07/2019   Diarrhea 04/13/2019   Abnormal gait 08/30/2018   Asthenia 08/30/2018   Conjunctivitis of left eye 04/13/2018   Rash 04/13/2018   Eruption at injection site 04/13/2018   Acute bilateral deep vein thrombosis (DVT) of femoral veins (HCC) 01/31/2018   Hypokalemia 01/31/2018   Electrocardiogram abnormal 01/31/2018   Increased creatine kinase level 01/31/2018   Left leg pain 01/13/2018   Left leg swelling 01/13/2018   Hearing loss 01/13/2018   Intractable pain 12/27/2017   Arachnoiditis 12/27/2017  Pain 12/27/2017   Hyponatremia    S/P lumbar laminectomy 12/23/2017   History of lumbar laminectomy 12/23/2017   Wheezing 12/10/2017   Itching 11/22/2017   Urinary frequency 11/22/2017   Increased frequency of urination 11/22/2017   Degeneration of lumbar intervertebral disc 11/05/2017   Sciatica 09/04/2017   Asthma exacerbation 08/31/2017    Acute asthma 08/31/2017   Flare of rheumatoid arthritis ([place]) 05/11/2017   Articular gout 05/01/2017   Vertigo 10/30/2016   Allergic rhinitis 10/30/2016   Incontinence of feces 07/31/2016   Hemorrhoids 07/31/2016   Hematochezia 07/31/2016   Peripheral venous insufficiency 44/10/270   Diastolic dysfunction 53/66/4403   Peripheral edema 07/10/2016   Left ankle pain 06/02/2016   Ankle pain 06/02/2016   Neck pain 03/10/2016   Low back pain 03/10/2016   Right cervical radiculopathy 11/13/2015   Bradycardia 03/15/2015   Joint pain 12/18/2014   Fever 11/27/2014   Impaired glucose tolerance 07/18/2014   Right lumbar radiculopathy 07/18/2014   Lumbar radiculopathy 07/18/2014   Encounter for well adult exam with abnormal findings 02/20/2014   Malignant neoplasm of upper-outer quadrant of female breast ([place]) 11/08/2013   Diverticular disease 07/09/2012   Headache(784.0) 06/30/2012   Anemia 06/29/2012   Dyspnea 03/11/2012   Cough 03/11/2012   CKD (chronic kidney disease) stage 4, GFR 15-29 ml/min (HCC) 05/18/2011   Gastroesophageal reflux disease 07/05/2008   Vitamin D deficiency 12/19/2007   Morbid obesity ([place]) 12/16/2007   Essential hypertension 12/16/2007   DEGENERATIVE JOINT DISEASE 12/16/2007   Osteoarthritis 12/16/2007    Past Medical History:  Diagnosis Date   Allergic rhinitis 10/30/2016   Anemia    Asthma    Breast cancer of upper-outer quadrant of left female breast ([place]) 11/08/2013   ER/PR+ Her2- Left IDC    Chronic renal insufficiency    Chronic rhinitis    Colon polyp    Diastolic dysfunction 4/74/2595   DJD (degenerative joint disease)    Dyspnea    Full dentures    GERD (gastroesophageal reflux disease)    Hearing loss    Hypertension    Hyponatremia    Impaired glucose tolerance 07/18/2014   Memory loss    Morbid obesity ([place])    Poor circulation    Vertigo    Wears glasses     Family History  Problem Relation Age of Onset   Colon cancer Mother         in her 43's   Stomach cancer Mother    Lung cancer Brother        was a smoker   Heart attack Son 64   Healthy Daughter    Past Surgical History:  Procedure Laterality Date   ABDOMINAL HYSTERECTOMY     BREAST LUMPECTOMY WITH NEEDLE LOCALIZATION AND AXILLARY SENTINEL LYMPH NODE BX Left 12/04/2013   Procedure: BREAST LUMPECTOMY WITH NEEDLE LOCALIZATION AND AXILLARY SENTINEL LYMPH NODE BX;  Surgeon: Shann Medal, MD;  Location: [facility];  Service: General;  Laterality: Left;   CATARACT EXTRACTION  2009   rt   COLONOSCOPY     EYE SURGERY Bilateral    cataract surgery   KNEE ARTHROSCOPY     both   LUMBAR LAMINECTOMY/DECOMPRESSION MICRODISCECTOMY Left 12/23/2017   Procedure: Left Lumbar One-Two Laminectomy with microdiscectomy;  Surgeon: Eustace Moore, MD;  Location: [facility];  Service: Neurosurgery;  Laterality: Left;  Left L1-2 Laminectomy with microdiscectomy   TONSILLECTOMY     TOTAL KNEE ARTHROPLASTY  2002   rt  TOTAL KNEE ARTHROPLASTY  2003   left   VESICOVAGINAL FISTULA CLOSURE W/ TAH  1980   Social History   Social History Narrative   Not on file   Immunization History  Administered Date(s) Administered   Fluad Quad(high Dose 65+) 11/27/2020, 06/27/2021   Influenza Split 08/07/2011, 10/13/2012   Influenza Whole 07/27/2008, 08/14/2009, 07/01/2010   Influenza, High Dose Seasonal PF 07/10/2016, 08/10/2017, 08/30/2018, 09/08/2019   Influenza,inj,Quad PF,6+ Mos 08/28/2013, 07/18/2014, 07/26/2015   PFIZER(Purple Top)SARS-COV-2 Vaccination 12/11/2019, 01/02/2020   Pneumococcal Conjugate-13 02/20/2014   Pneumococcal Polysaccharide-23 10/26/2005   Tdap 11/22/2017     Objective: Vital Signs: BP (!) 165/88 (BP Location: Right Arm, Patient Position: Sitting, Cuff Size: Normal)   Pulse 71   Ht _0  (1.549 m)   Wt 155 lb (70.3 kg)   BMI 29.29 kg/m    Physical Exam Vitals and nursing note reviewed.  Constitutional:      Appearance: She is  well-developed.  HENT:     Head: Normocephalic and atraumatic.  Eyes:     Conjunctiva/sclera: Conjunctivae normal.  Cardiovascular:     Rate and Rhythm: Normal rate and regular rhythm.     Heart sounds: Normal heart sounds.  Pulmonary:     Effort: Pulmonary effort is normal.     Breath sounds: Normal breath sounds.  Abdominal:     General: Bowel sounds are normal.     Palpations: Abdomen is soft.  Musculoskeletal:     Cervical back: Normal range of motion.  Lymphadenopathy:     Cervical: No cervical adenopathy.  Skin:    General: Skin is warm and dry.     Capillary Refill: Capillary refill takes less than 2 seconds.  Neurological:     Mental Status: She is alert and oriented to person, place, and time.  Psychiatric:        Behavior: Behavior normal.     Musculoskeletal Exam: She had limited range of motion of her cervical spine.  She had limited range of motion of the shoulder joints.  Elbow joints and wrist joints with good range of motion.  She had DIP and PIP thickening.  Hip joints were difficult to assess in the sitting position.  She had bilateral total knee replacement.  She had no tenderness over ankles or MTPs.  CDAI Exam: CDAI Score: -- Patient Global: --; Provider Global: -- Swollen: --; Tender: -- Joint Exam 07/01/2021   No joint exam has been documented for this visit   There is currently no information documented on the homunculus. Go to the Rheumatology activity and complete the homunculus joint exam.  Investigation: No additional findings.  Imaging: DG Chest 2 View  Result Date: 06/18/2021 CLINICAL DATA:  Cough for 2 weeks with bibasilar crackles, history of breast cancer EXAM: CHEST - 2 VIEW COMPARISON:  05/30/2018 chest radiograph. FINDINGS: Stable cardiomediastinal silhouette with normal heart size. No pneumothorax. No pleural effusion. Lungs appear clear, with no acute consolidative airspace disease and no pulmonary edema. IMPRESSION: No active  cardiopulmonary disease. Electronically Signed   By: Ilona Sorrel M.D.   On: 06/18/2021 22:30   DG Knee Complete 4 Views Right  Result Date: 06/30/2021 CLINICAL DATA:  Acute right knee pain after fall 2 days ago. EXAM: RIGHT KNEE - COMPLETE 4+ VIEW COMPARISON:  None. FINDINGS: Status post right total knee arthroplasty. The right femoral and tibial components are well situated. No acute fracture or dislocation is noted. IMPRESSION: Status post right total knee arthroplasty. No acute abnormality is noted.  Electronically Signed   By: Marijo Conception M.D.   On: 06/30/2021 13:02   DG Hip Unilat W OR W/O Pelvis 2-3 Views Right  Result Date: 06/30/2021 CLINICAL DATA:  Acute right hip pain after fall 2 days ago. EXAM: DG HIP (WITH OR WITHOUT PELVIS) 2-3V RIGHT COMPARISON:  None. FINDINGS: There is no evidence of hip fracture or dislocation. Mild narrowing of right hip joint is noted. IMPRESSION: Mild degenerative joint disease.  No acute abnormality seen. Electronically Signed   By: Marijo Conception M.D.   On: 06/30/2021 13:00    Recent Labs: Lab Results  Component Value Date   WBC 7.1 06/18/2021   HGB 11.7 (L) 06/18/2021   PLT 239.0 06/18/2021   NA 139 06/27/2021   K 3.9 06/27/2021   CL 98 06/27/2021   CO2 29 06/27/2021   GLUCOSE 110 (H) 06/27/2021   BUN 39 (H) 06/27/2021   CREATININE 2.34 (H) 06/27/2021   BILITOT 0.4 06/18/2021   ALKPHOS 60 06/18/2021   AST 39 (H) 06/18/2021   ALT 28 06/18/2021   PROT 7.1 06/18/2021   ALBUMIN 4.0 06/18/2021   CALCIUM 9.7 06/27/2021   GFRAA 25 (L) 12/25/2020    Speciality Comments: No specialty comments available.  Procedures:  No procedures performed Allergies: Hydrocodone, Lasix [furosemide], Tizanidine, and Iron   Assessment / Plan:     Visit Diagnoses: Chondrocalcinosis -   she had chondrocalcinosis on her x-rays.  Although I am uncertain how much it is contributing to her symptoms.  She tried colchicine but she had to discontinue due to side  effects.  Primary osteoarthritis of both hands - The clinical and radiographic findings are consistent with osteoarthritis.  She had no synovitis on examination.  Primary osteoarthritis of both shoulders-she has limited range of motion of her shoulder joint with discomfort.  Status post total knee replacement, bilateral-she has chronic pain and discomfort.  Pain in both feet - With subtalar collapse consistent with inflammatory arthritis, most likely due to chronic chondrocalcinosis.   Long term (current) use of systemic steroids - she has been on prednisone for many years.  I tried to taper her prednisone but she could not tolerate it.  She increased the dose of prednisone back to 7 mg p.o. daily.  She is currently on prednisone 7 mg p.o. daily.  She does not want to taper prednisone and I will prefer to stay on the same dose.  Her daughter and patient both requested if they can follow-up with Dr. Jenny Reichmann only and not to come here for follow-up visits due to inconvenience and co-pay.  I advised him to discuss this further with Dr. Jenny Reichmann.  DDD (degenerative disc disease), cervical-she is limited range of motion.  Degenerative disc disease, lumbar - Status post laminectomy.  She is off-and-on discomfort.  Myalgia-she has no muscular weakness but she has muscle discomfort.  She states the myalgias get worse when she is off prednisone.  Stage 3 chronic kidney disease, unspecified whether stage 3a or 3b CKD (HCC)  Malignant neoplasm of upper-outer quadrant of left breast in female, estrogen receptor positive ([place])  Acute bilateral deep vein thrombosis (DVT) of femoral veins (HCC)  Diverticulosis  Mild intermittent asthmatic bronchitis with acute exacerbation  History of gastroesophageal reflux (GERD)  Essential hypertension  Chronic venous insufficiency  Orders: No orders of the defined types were placed in this encounter.  Meds ordered this encounter  Medications   predniSONE  (DELTASONE) 5 MG tablet    Sig:  Take 1 tablet (5 mg total) by mouth daily with breakfast.    Dispense:  30 tablet    Refill:  0   predniSONE (DELTASONE) 1 MG tablet    Sig: Take 1 tablet (1 mg total) by mouth daily with breakfast. Along with 41m to equal 668mby mouth daily.    Dispense:  60 tablet    Refill:  0      Follow-Up Instructions: Return in about 4 months (around 10/31/2021) for Osteoarthritis.   ShBo MerinoMD  Note - This record has been created using DrEditor, commissioning Chart creation errors have been sought, but may not always  have been located. Such creation errors do not reflect on  the standard of medical care.

## 2021-06-26 NOTE — Telephone Encounter (Signed)
Patient has been advised and verbalizes understanding 

## 2021-06-27 ENCOUNTER — Other Ambulatory Visit: Payer: Self-pay | Admitting: Internal Medicine

## 2021-06-27 ENCOUNTER — Ambulatory Visit (INDEPENDENT_AMBULATORY_CARE_PROVIDER_SITE_OTHER): Payer: Medicare HMO | Admitting: Internal Medicine

## 2021-06-27 ENCOUNTER — Encounter: Payer: Self-pay | Admitting: Internal Medicine

## 2021-06-27 ENCOUNTER — Ambulatory Visit (INDEPENDENT_AMBULATORY_CARE_PROVIDER_SITE_OTHER): Payer: Medicare HMO

## 2021-06-27 VITALS — BP 142/78 | HR 75 | Temp 98.7°F | Ht 61.0 in | Wt 153.0 lb

## 2021-06-27 DIAGNOSIS — Z96651 Presence of right artificial knee joint: Secondary | ICD-10-CM | POA: Diagnosis not present

## 2021-06-27 DIAGNOSIS — N184 Chronic kidney disease, stage 4 (severe): Secondary | ICD-10-CM

## 2021-06-27 DIAGNOSIS — M25551 Pain in right hip: Secondary | ICD-10-CM | POA: Diagnosis not present

## 2021-06-27 DIAGNOSIS — M25561 Pain in right knee: Secondary | ICD-10-CM

## 2021-06-27 DIAGNOSIS — R609 Edema, unspecified: Secondary | ICD-10-CM | POA: Diagnosis not present

## 2021-06-27 DIAGNOSIS — Z23 Encounter for immunization: Secondary | ICD-10-CM | POA: Diagnosis not present

## 2021-06-27 LAB — BASIC METABOLIC PANEL
BUN: 39 mg/dL — ABNORMAL HIGH (ref 6–23)
CO2: 29 mEq/L (ref 19–32)
Calcium: 9.7 mg/dL (ref 8.4–10.5)
Chloride: 98 mEq/L (ref 96–112)
Creatinine, Ser: 2.34 mg/dL — ABNORMAL HIGH (ref 0.40–1.20)
GFR: 18.76 mL/min — ABNORMAL LOW (ref >60.00)
Glucose, Bld: 110 mg/dL — ABNORMAL HIGH (ref 70–99)
Potassium: 3.9 mEq/L (ref 3.5–5.1)
Sodium: 139 mEq/L (ref 135–145)

## 2021-06-27 NOTE — Telephone Encounter (Signed)
Please refill as per office routine med refill policy (all routine meds to be refilled for 3 mo or monthly (per pt preference) up to one year from last visit, then month to month grace period for 3 mo, then further med refills will have to be denied) ? ?

## 2021-06-27 NOTE — Progress Notes (Signed)
Patient ID: [name], female   DOB: 07/22/1936, 85 y.o.   MRN: [id number]        Chief Complaint: follow up recent fall with right hip and knee pain, CHF       HPI:  [name] is a 85 y.o. female here with c/o dizzy, weakness and fall recently to right hip and knee after starting dialy lasix 40 qd, Pt denies chest pain, increased sob or doe, wheezing, orthopnea, PND, increased LE swelling, palpitations,  or syncope, and leg swelling much improved.   Pt denies polydipsia, polyuria, or new focal neuro s/s.   Pt denies fever, wt loss, night sweats, loss of appetite, or other constitutional symptoms   Wt Readings from Last 3 Encounters:  06/27/21 153 lb (69.4 kg)  06/18/21 153 lb (69.4 kg)  05/20/21 157 lb 6.4 oz (71.4 kg)   BP Readings from Last 3 Encounters:  06/27/21 (!) 142/78  06/18/21 122/72  05/20/21 140/80         Past Medical History:  Diagnosis Date   Allergic rhinitis 10/30/2016   Anemia    Asthma    Breast cancer of upper-outer quadrant of left female breast ([place]) 11/08/2013   ER/PR+ Her2- Left IDC    Chronic renal insufficiency    Chronic rhinitis    Colon polyp    Diastolic dysfunction 6/54/6503   DJD (degenerative joint disease)    Dyspnea    Full dentures    GERD (gastroesophageal reflux disease)    Hearing loss    Hypertension    Hyponatremia    Impaired glucose tolerance 07/18/2014   Memory loss    Morbid obesity ([place])    Poor circulation    Vertigo    Wears glasses    Past Surgical History:  Procedure Laterality Date   ABDOMINAL HYSTERECTOMY     BREAST LUMPECTOMY WITH NEEDLE LOCALIZATION AND AXILLARY SENTINEL LYMPH NODE BX Left 12/04/2013   Procedure: BREAST LUMPECTOMY WITH NEEDLE LOCALIZATION AND AXILLARY SENTINEL LYMPH NODE BX;  Surgeon: Shann Medal, MD;  Location: [facility];  Service: General;  Laterality: Left;   CATARACT EXTRACTION  2009   rt   COLONOSCOPY     EYE SURGERY Bilateral    cataract surgery   KNEE  ARTHROSCOPY     both   LUMBAR LAMINECTOMY/DECOMPRESSION MICRODISCECTOMY Left 12/23/2017   Procedure: Left Lumbar One-Two Laminectomy with microdiscectomy;  Surgeon: Eustace Moore, MD;  Location: [facility];  Service: Neurosurgery;  Laterality: Left;  Left L1-2 Laminectomy with microdiscectomy   TONSILLECTOMY     TOTAL KNEE ARTHROPLASTY  2002   rt   TOTAL KNEE ARTHROPLASTY  2003   left   VESICOVAGINAL FISTULA CLOSURE W/ TAH  1980    reports that she has never smoked. She has never used smokeless tobacco. She reports that she does not drink alcohol and does not use drugs. family history includes Colon cancer in her mother; Healthy in her daughter; Heart attack (age of onset: 83) in her son; Lung cancer in her brother; Stomach cancer in her mother. Allergies  Allergen Reactions   Hydrocodone Itching   Tizanidine Other (See Comments)    Dizzy and fall   Iron Hives and Other (See Comments)    [place], bad constipation Other reaction(s): Unknown   Current Outpatient Medications on File Prior to Visit  Medication Sig Dispense Refill   albuterol (PROVENTIL HFA;VENTOLIN HFA) 108 (90 Base) MCG/ACT inhaler Inhale 2 puffs into the lungs every 6 (  six) hours as needed for wheezing or shortness of breath. 1 Inhaler 11   Albuterol Sulfate (PROAIR RESPICLICK) 182 (90 Base) MCG/ACT AEPB 1 puff as needed     amLODipine (NORVASC) 5 MG tablet TAKE 1 TABLET BY MOUTH EVERY DAY 90 tablet 3   aspirin 81 MG tablet Take 81 mg by mouth daily.     ASPIRIN 81 PO 1 tablet     colchicine 0.6 MG tablet Take 0.3 mg by mouth daily.     diclofenac Sodium (VOLTAREN) 1 % GEL APPLY 2 GRAMS TO AFFECTED AREA 4 TIMES A DAY 100 g 5   Ensure (ENSURE) Take 1 Can by mouth 3 (three) times daily between meals. 237 mL 12   ergocalciferol (VITAMIN D2) 1.25 MG (50000 UT) capsule 1 capsule     FERREX 150 150 MG capsule TAKE 1 CAPSULE BY MOUTH TWICE A DAY 60 capsule 1   fluticasone-salmeterol (ADVAIR DISKUS) 250-50 MCG/ACT AEPB Inhale 1  puff into the lungs in the morning and at bedtime. 180 each 3   furosemide (LASIX) 40 MG tablet Take 1 tablet (40 mg total) by mouth daily. 90 tablet 3   gabapentin (NEURONTIN) 300 MG capsule TAKE 1 CAPSULE BY MOUTH THREE TIMES A DAY 90 capsule 5   hydrOXYzine (VISTARIL) 50 MG capsule 1 capsule as needed     Incontinence Supply Disposable (DEPEND UNDERWEAR SM/MED) MISC Use as directed four times per day 120 each 5   losartan (COZAAR) 100 MG tablet Take 1 tablet by mouth daily.     magnesium gluconate (MAGONATE) 500 MG tablet Take 500 mg by mouth 2 (two) times daily.     Magnesium Gluconate 500 (27 Mg) MG TABS Take 1 tablet (500 mg total) by mouth 2 (two) times daily. 90 tablet 2   meclizine (ANTIVERT) 12.5 MG tablet TAKE 1 TABLET BY MOUTH THREE TIMES A DAY AS NEEDED FOR DIZZINESS 90 tablet 2   methocarbamol (ROBAXIN) 500 MG tablet Take 1 tablet (500 mg total) by mouth every 8 (eight) hours as needed for muscle spasms. 10 tablet 0   MOMETASONE-AMMONIUM LACTATE EX mometasone     montelukast (SINGULAIR) 10 MG tablet TAKE 1 TABLET BY MOUTH EVERY DAY 90 tablet 1   Multiple Vitamins-Minerals (CENTRUM SILVER PO) Take 1 tablet by mouth daily.     pantoprazole (PROTONIX) 40 MG tablet TAKE 1 TABLET BY MOUTH EVERY DAY 90 tablet 1   potassium chloride (KLOR-CON) 10 MEQ tablet TAKE 1 TABLET BY MOUTH EVERY DAY WHEN TAKING FUROSEMIDE 90 tablet 3   Potassium Gluconate 550 MG TABS 1 tablet     Simethicone (GAS-X PO) Take 1 tablet by mouth daily as needed (for gas).      solifenacin (VESICARE) 5 MG tablet Take 1 tablet (5 mg total) by mouth daily. 90 tablet 3   tizanidine (ZANAFLEX) 2 MG capsule TAKE 1 CAPSULE BY MOUTH 3 TIMES A DAY AS NEEDED FOR MUSCLE SPASMS 40 capsule 1   traMADol (ULTRAM-ER) 200 MG 24 hr tablet TAKE 1 TABLET BY MOUTH EVERY DAY 90 tablet 1   triamcinolone (NASACORT AQ) 55 MCG/ACT AERO nasal inhaler Place 2 sprays into the nose daily. 1 Inhaler 12   No current facility-administered medications  on file prior to visit.        ROS:  All others reviewed and negative.  Objective        PE:  BP (!) 142/78 (BP Location: Right Arm, Patient Position: Sitting, Cuff Size: Normal)   Pulse 75  Temp 98.7 F (37.1 C) (Oral)   Ht 5' 1" (1.549 m)   Wt 153 lb (69.4 kg)   SpO2 96%   BMI 28.91 kg/m                 Constitutional: Pt appears in NAD               HENT: Head: NCAT.                Right Ear: External ear normal.                 Left Ear: External ear normal.                Eyes: . Pupils are equal, round, and reactive to light. Conjunctivae and EOM are normal               Nose: without d/c or deformity               Neck: Neck supple. Gross normal ROM               Cardiovascular: Normal rate and regular rhythm.                 Pulmonary/Chest: Effort normal and breath sounds without rales or wheezing.                Abd:  Soft, NT, ND, + BS, no organomegaly               Neurological: Pt is alert. At baseline orientation, motor grossly intact               Skin: Skin is warm. No rashes, no other new lesions, LE edema - none, right lateral hip and knee with bruising mild tender               Psychiatric: Pt behavior is normal without agitation   Micro: none  Cardiac tracings I have personally interpreted today:  none  Pertinent Radiological findings (summarize): none   Lab Results  Component Value Date   WBC 7.1 06/18/2021   HGB 11.7 (L) 06/18/2021   HCT 34.8 (L) 06/18/2021   PLT 239.0 06/18/2021   GLUCOSE 110 (H) 06/27/2021   CHOL 219 (H) 01/24/2021   TRIG 134 01/24/2021   HDL 83 01/24/2021   LDLDIRECT 109.0 08/01/2020   LDLCALC 111 (H) 01/24/2021   ALT 28 06/18/2021   AST 39 (H) 06/18/2021   NA 139 06/27/2021   K 3.9 06/27/2021   CL 98 06/27/2021   CREATININE 2.34 (H) 06/27/2021   BUN 39 (H) 06/27/2021   CO2 29 06/27/2021   TSH 0.86 01/24/2021   INR 1.35 01/17/2018   HGBA1C 6.1 06/18/2021   Assessment/Plan:  Paizley SEILA LISTON is a 85 y.o. Black  or African American [2] female with  has a past medical history of Allergic rhinitis (10/30/2016), Anemia, Asthma, Breast cancer of upper-outer quadrant of left female breast ([place]) (11/08/2013), Chronic renal insufficiency, Chronic rhinitis, Colon polyp, Diastolic dysfunction (0/30/0923), DJD (degenerative joint disease), Dyspnea, Full dentures, GERD (gastroesophageal reflux disease), Hearing loss, Hypertension, Hyponatremia, Impaired glucose tolerance (07/18/2014), Memory loss, Morbid obesity ([place]), Poor circulation, Vertigo, and Wears glasses.  Right knee pain Post fall with brusing, for film r/o fx  Right hip pain Post fall with bruising laterall, for film r/o fx  Peripheral edema Appears mild overcontrolled, to decrease lasix to m-w-f or prn, for f/u bmp today  CKD (chronic kidney disease) stage  4, GFR 15-29 ml/min (HCC) Also for f//u bmp r/o worsening  Followup: Return in about 3 months (around 09/26/2021).  Cathlean Cower, MD 06/30/2021 9:31 PM [facility]

## 2021-06-27 NOTE — Patient Instructions (Signed)
Ok to change the lasix to 40 mg Mon-Wed-Fri OR as needed  Please continue all other medications as before, and refills have been done if requested.  Please have the pharmacy call with any other refills you may need.  Please continue your efforts at being more active, low cholesterol diet, and weight control.  Please keep your appointments with your specialists as you may have planned  Please go to the XRAY Department in the first floor for the x-ray testing  Please go to the LAB at the blood drawing area for the tests to be done  You will be contacted by phone if any changes need to be made immediately.  Otherwise, you will receive a letter about your results with an explanation, but please check with MyChart first.  Please remember to sign up for MyChart if you have not done so, as this will be important to you in the future with finding out test results, communicating by private email, and scheduling acute appointments online when needed.  Please make an Appointment to return in 3 months, or sooner if needed

## 2021-06-28 ENCOUNTER — Encounter: Payer: Self-pay | Admitting: Internal Medicine

## 2021-06-29 ENCOUNTER — Other Ambulatory Visit: Payer: Self-pay | Admitting: Internal Medicine

## 2021-06-30 ENCOUNTER — Encounter: Payer: Self-pay | Admitting: Internal Medicine

## 2021-06-30 NOTE — Assessment & Plan Note (Signed)
Also for f//u bmp r/o worsening

## 2021-06-30 NOTE — Assessment & Plan Note (Signed)
Post fall with brusing, for film r/o fx

## 2021-06-30 NOTE — Assessment & Plan Note (Signed)
Post fall with bruising laterall, for film r/o fx

## 2021-06-30 NOTE — Assessment & Plan Note (Signed)
Appears mild overcontrolled, to decrease lasix to m-w-f or prn, for f/u bmp today

## 2021-07-01 ENCOUNTER — Other Ambulatory Visit: Payer: Self-pay

## 2021-07-01 ENCOUNTER — Encounter: Payer: Self-pay | Admitting: Rheumatology

## 2021-07-01 ENCOUNTER — Ambulatory Visit: Payer: Medicare HMO | Admitting: Rheumatology

## 2021-07-01 VITALS — BP 165/88 | HR 71 | Ht 61.0 in | Wt 155.0 lb

## 2021-07-01 DIAGNOSIS — M5136 Other intervertebral disc degeneration, lumbar region: Secondary | ICD-10-CM

## 2021-07-01 DIAGNOSIS — M1120 Other chondrocalcinosis, unspecified site: Secondary | ICD-10-CM | POA: Diagnosis not present

## 2021-07-01 DIAGNOSIS — I872 Venous insufficiency (chronic) (peripheral): Secondary | ICD-10-CM

## 2021-07-01 DIAGNOSIS — M791 Myalgia, unspecified site: Secondary | ICD-10-CM

## 2021-07-01 DIAGNOSIS — M79671 Pain in right foot: Secondary | ICD-10-CM

## 2021-07-01 DIAGNOSIS — M19011 Primary osteoarthritis, right shoulder: Secondary | ICD-10-CM | POA: Diagnosis not present

## 2021-07-01 DIAGNOSIS — N183 Chronic kidney disease, stage 3 unspecified: Secondary | ICD-10-CM

## 2021-07-01 DIAGNOSIS — Z8719 Personal history of other diseases of the digestive system: Secondary | ICD-10-CM

## 2021-07-01 DIAGNOSIS — M5030 Other cervical disc degeneration, unspecified cervical region: Secondary | ICD-10-CM | POA: Diagnosis not present

## 2021-07-01 DIAGNOSIS — M19042 Primary osteoarthritis, left hand: Secondary | ICD-10-CM

## 2021-07-01 DIAGNOSIS — J4521 Mild intermittent asthma with (acute) exacerbation: Secondary | ICD-10-CM

## 2021-07-01 DIAGNOSIS — Z96653 Presence of artificial knee joint, bilateral: Secondary | ICD-10-CM

## 2021-07-01 DIAGNOSIS — I10 Essential (primary) hypertension: Secondary | ICD-10-CM

## 2021-07-01 DIAGNOSIS — M19041 Primary osteoarthritis, right hand: Secondary | ICD-10-CM

## 2021-07-01 DIAGNOSIS — C50412 Malignant neoplasm of upper-outer quadrant of left female breast: Secondary | ICD-10-CM

## 2021-07-01 DIAGNOSIS — Z7952 Long term (current) use of systemic steroids: Secondary | ICD-10-CM | POA: Diagnosis not present

## 2021-07-01 DIAGNOSIS — I82413 Acute embolism and thrombosis of femoral vein, bilateral: Secondary | ICD-10-CM

## 2021-07-01 DIAGNOSIS — M79672 Pain in left foot: Secondary | ICD-10-CM

## 2021-07-01 DIAGNOSIS — K5790 Diverticulosis of intestine, part unspecified, without perforation or abscess without bleeding: Secondary | ICD-10-CM

## 2021-07-01 DIAGNOSIS — Z170 Estrogen receptor positive status [ER+]: Secondary | ICD-10-CM

## 2021-07-01 DIAGNOSIS — M19012 Primary osteoarthritis, left shoulder: Secondary | ICD-10-CM

## 2021-07-01 MED ORDER — PREDNISONE 1 MG PO TABS: 1.0000 mg | ORAL_TABLET | Freq: Every day | ORAL | 0 refills | Status: DC

## 2021-07-01 MED ORDER — PREDNISONE 5 MG PO TABS: 5.0000 mg | ORAL_TABLET | Freq: Every day | ORAL | 0 refills | Status: DC

## 2021-07-03 ENCOUNTER — Encounter: Payer: Self-pay | Admitting: Internal Medicine

## 2021-07-03 ENCOUNTER — Telehealth: Payer: Self-pay | Admitting: Internal Medicine

## 2021-07-03 NOTE — Telephone Encounter (Signed)
   Cecille Rubin from [facility] calling to request verbal order to delay start of care until 9/9 Phone [phone]

## 2021-07-03 NOTE — Telephone Encounter (Signed)
Spoke with Cecille Rubin and was able to give her the verbal OK per Dr. Jenny Reichmann to delay services until 07/04/2021.

## 2021-07-03 NOTE — Telephone Encounter (Signed)
Ok for verbal 

## 2021-07-04 ENCOUNTER — Telehealth: Payer: Self-pay

## 2021-07-04 DIAGNOSIS — N184 Chronic kidney disease, stage 4 (severe): Secondary | ICD-10-CM | POA: Diagnosis not present

## 2021-07-04 DIAGNOSIS — M25551 Pain in right hip: Secondary | ICD-10-CM | POA: Diagnosis not present

## 2021-07-04 DIAGNOSIS — I5031 Acute diastolic (congestive) heart failure: Secondary | ICD-10-CM | POA: Diagnosis not present

## 2021-07-04 DIAGNOSIS — G8929 Other chronic pain: Secondary | ICD-10-CM | POA: Diagnosis not present

## 2021-07-04 DIAGNOSIS — R42 Dizziness and giddiness: Secondary | ICD-10-CM | POA: Diagnosis not present

## 2021-07-04 DIAGNOSIS — I130 Hypertensive heart and chronic kidney disease with heart failure and stage 1 through stage 4 chronic kidney disease, or unspecified chronic kidney disease: Secondary | ICD-10-CM | POA: Diagnosis not present

## 2021-07-04 DIAGNOSIS — M353 Polymyalgia rheumatica: Secondary | ICD-10-CM | POA: Diagnosis not present

## 2021-07-04 DIAGNOSIS — M7501 Adhesive capsulitis of right shoulder: Secondary | ICD-10-CM | POA: Diagnosis not present

## 2021-07-04 DIAGNOSIS — R269 Unspecified abnormalities of gait and mobility: Secondary | ICD-10-CM

## 2021-07-04 DIAGNOSIS — M7502 Adhesive capsulitis of left shoulder: Secondary | ICD-10-CM | POA: Diagnosis not present

## 2021-07-04 DIAGNOSIS — M797 Fibromyalgia: Secondary | ICD-10-CM | POA: Diagnosis not present

## 2021-07-04 DIAGNOSIS — R531 Weakness: Secondary | ICD-10-CM

## 2021-07-04 NOTE — Addendum Note (Signed)
Addended by: Biagio Borg on: 07/04/2021 12:42 PM   Modules accepted: Orders

## 2021-07-04 NOTE — Telephone Encounter (Signed)
Orders faxed

## 2021-07-04 NOTE — Telephone Encounter (Signed)
Please advise as the pt has stated she was informed by her PT therapist that she needs a shower chair due to prevent from falling upon bathing. May you pease send the DME order to [employer] for a shower chair.

## 2021-07-04 NOTE — Telephone Encounter (Signed)
Ok done hardcopy to staff cma

## 2021-07-07 ENCOUNTER — Telehealth: Payer: Self-pay

## 2021-07-07 DIAGNOSIS — M25551 Pain in right hip: Secondary | ICD-10-CM | POA: Diagnosis not present

## 2021-07-07 DIAGNOSIS — N184 Chronic kidney disease, stage 4 (severe): Secondary | ICD-10-CM | POA: Diagnosis not present

## 2021-07-07 DIAGNOSIS — R42 Dizziness and giddiness: Secondary | ICD-10-CM | POA: Diagnosis not present

## 2021-07-07 DIAGNOSIS — M7501 Adhesive capsulitis of right shoulder: Secondary | ICD-10-CM | POA: Diagnosis not present

## 2021-07-07 DIAGNOSIS — G8929 Other chronic pain: Secondary | ICD-10-CM | POA: Diagnosis not present

## 2021-07-07 DIAGNOSIS — M7502 Adhesive capsulitis of left shoulder: Secondary | ICD-10-CM | POA: Diagnosis not present

## 2021-07-07 DIAGNOSIS — M797 Fibromyalgia: Secondary | ICD-10-CM | POA: Diagnosis not present

## 2021-07-07 DIAGNOSIS — I5031 Acute diastolic (congestive) heart failure: Secondary | ICD-10-CM | POA: Diagnosis not present

## 2021-07-07 DIAGNOSIS — I130 Hypertensive heart and chronic kidney disease with heart failure and stage 1 through stage 4 chronic kidney disease, or unspecified chronic kidney disease: Secondary | ICD-10-CM | POA: Diagnosis not present

## 2021-07-07 DIAGNOSIS — M353 Polymyalgia rheumatica: Secondary | ICD-10-CM | POA: Diagnosis not present

## 2021-07-07 NOTE — Telephone Encounter (Signed)
Please advise as Beckie Busing is asking for verbal orders for PT  2W 5 1w 2 for strengthening, safe tranfers, gait training, balance training, BPPV and safety fall precautions.  Verbals also needed for OT eval and treatment for home modification safety.  Beckie Busing can be reached at [phone] and it is OK to LVM if needed with Verbal OK.

## 2021-07-07 NOTE — Telephone Encounter (Signed)
Ok for verbals 

## 2021-07-08 ENCOUNTER — Telehealth: Payer: Self-pay | Admitting: Internal Medicine

## 2021-07-08 DIAGNOSIS — M797 Fibromyalgia: Secondary | ICD-10-CM | POA: Diagnosis not present

## 2021-07-08 DIAGNOSIS — M0550 Rheumatoid polyneuropathy with rheumatoid arthritis of unspecified site: Secondary | ICD-10-CM | POA: Diagnosis not present

## 2021-07-08 DIAGNOSIS — M109 Gout, unspecified: Secondary | ICD-10-CM | POA: Diagnosis not present

## 2021-07-08 DIAGNOSIS — M7502 Adhesive capsulitis of left shoulder: Secondary | ICD-10-CM | POA: Diagnosis not present

## 2021-07-08 DIAGNOSIS — I5031 Acute diastolic (congestive) heart failure: Secondary | ICD-10-CM | POA: Diagnosis not present

## 2021-07-08 DIAGNOSIS — M25551 Pain in right hip: Secondary | ICD-10-CM | POA: Diagnosis not present

## 2021-07-08 DIAGNOSIS — R42 Dizziness and giddiness: Secondary | ICD-10-CM | POA: Diagnosis not present

## 2021-07-08 DIAGNOSIS — I130 Hypertensive heart and chronic kidney disease with heart failure and stage 1 through stage 4 chronic kidney disease, or unspecified chronic kidney disease: Secondary | ICD-10-CM | POA: Diagnosis not present

## 2021-07-08 DIAGNOSIS — G8929 Other chronic pain: Secondary | ICD-10-CM | POA: Diagnosis not present

## 2021-07-08 DIAGNOSIS — I509 Heart failure, unspecified: Secondary | ICD-10-CM | POA: Diagnosis not present

## 2021-07-08 DIAGNOSIS — M353 Polymyalgia rheumatica: Secondary | ICD-10-CM | POA: Diagnosis not present

## 2021-07-08 DIAGNOSIS — J449 Chronic obstructive pulmonary disease, unspecified: Secondary | ICD-10-CM | POA: Diagnosis not present

## 2021-07-08 DIAGNOSIS — M7501 Adhesive capsulitis of right shoulder: Secondary | ICD-10-CM | POA: Diagnosis not present

## 2021-07-08 DIAGNOSIS — R69 Illness, unspecified: Secondary | ICD-10-CM | POA: Diagnosis not present

## 2021-07-08 DIAGNOSIS — M1990 Unspecified osteoarthritis, unspecified site: Secondary | ICD-10-CM | POA: Diagnosis not present

## 2021-07-08 DIAGNOSIS — K219 Gastro-esophageal reflux disease without esophagitis: Secondary | ICD-10-CM | POA: Diagnosis not present

## 2021-07-08 DIAGNOSIS — J302 Other seasonal allergic rhinitis: Secondary | ICD-10-CM | POA: Diagnosis not present

## 2021-07-08 DIAGNOSIS — N184 Chronic kidney disease, stage 4 (severe): Secondary | ICD-10-CM | POA: Diagnosis not present

## 2021-07-08 NOTE — Telephone Encounter (Signed)
1.Medication Requested: furosemide (LASIX) 40 MG tablet  2. Pharmacy (Name, [facility]): [id number] - , [place]. [address]  Phone:  [phone] Fax:  [phone]   3. On Med List: yes  4. Last Visit with PCP: 09.02.22  5. Next visit date with PCP: n/a  **Patient also has concerns regarding congestive heart failure & would like to speak w/ provider about **  Please call [phone]   Agent: Please be advised that RX refills may take up to 3 business days. We ask that you follow-up with your pharmacy.

## 2021-07-09 NOTE — Telephone Encounter (Signed)
Verbal orders given on VM of Monique

## 2021-07-10 ENCOUNTER — Other Ambulatory Visit: Payer: Self-pay

## 2021-07-10 DIAGNOSIS — I5031 Acute diastolic (congestive) heart failure: Secondary | ICD-10-CM

## 2021-07-10 MED ORDER — FUROSEMIDE 40 MG PO TABS: 40.0000 mg | ORAL_TABLET | Freq: Every day | ORAL | 3 refills | Status: DC

## 2021-07-10 NOTE — Telephone Encounter (Signed)
Unortunately I am not able to do phone consults like this; if there I a specific question I can try to answer, o/w we would need to have an OV

## 2021-07-11 ENCOUNTER — Telehealth: Payer: Self-pay | Admitting: Internal Medicine

## 2021-07-11 DIAGNOSIS — M25551 Pain in right hip: Secondary | ICD-10-CM | POA: Diagnosis not present

## 2021-07-11 DIAGNOSIS — M7501 Adhesive capsulitis of right shoulder: Secondary | ICD-10-CM | POA: Diagnosis not present

## 2021-07-11 DIAGNOSIS — M7502 Adhesive capsulitis of left shoulder: Secondary | ICD-10-CM | POA: Diagnosis not present

## 2021-07-11 DIAGNOSIS — M797 Fibromyalgia: Secondary | ICD-10-CM | POA: Diagnosis not present

## 2021-07-11 DIAGNOSIS — R42 Dizziness and giddiness: Secondary | ICD-10-CM | POA: Diagnosis not present

## 2021-07-11 DIAGNOSIS — I5031 Acute diastolic (congestive) heart failure: Secondary | ICD-10-CM | POA: Diagnosis not present

## 2021-07-11 DIAGNOSIS — N184 Chronic kidney disease, stage 4 (severe): Secondary | ICD-10-CM | POA: Diagnosis not present

## 2021-07-11 DIAGNOSIS — G8929 Other chronic pain: Secondary | ICD-10-CM | POA: Diagnosis not present

## 2021-07-11 DIAGNOSIS — I130 Hypertensive heart and chronic kidney disease with heart failure and stage 1 through stage 4 chronic kidney disease, or unspecified chronic kidney disease: Secondary | ICD-10-CM | POA: Diagnosis not present

## 2021-07-11 DIAGNOSIS — M353 Polymyalgia rheumatica: Secondary | ICD-10-CM | POA: Diagnosis not present

## 2021-07-11 NOTE — Telephone Encounter (Signed)
I can confirm this diagnosis as of 06-18-21 at her last exam with me, thanks

## 2021-07-11 NOTE — Telephone Encounter (Signed)
Spoke with pt and was able to inform her of Dr. Gwynn Burly advice to come in and talk about her HF concerns. Pt has set up apptmnt for 07/22/2021 at 8:40am.

## 2021-07-11 NOTE — Telephone Encounter (Signed)
Anissa, nurse with [facility] saw the patient  today for 1st time  Patient and daughter deny diagnosis of Acute congestive heart failure, state they were never told about this and do not believe this is her diagnosis of hers,   Nurse would like for the patient's daughter [name]) to confirm    [name] [id number]

## 2021-07-15 DIAGNOSIS — R42 Dizziness and giddiness: Secondary | ICD-10-CM | POA: Diagnosis not present

## 2021-07-15 DIAGNOSIS — M7501 Adhesive capsulitis of right shoulder: Secondary | ICD-10-CM | POA: Diagnosis not present

## 2021-07-15 DIAGNOSIS — M7502 Adhesive capsulitis of left shoulder: Secondary | ICD-10-CM | POA: Diagnosis not present

## 2021-07-15 DIAGNOSIS — I130 Hypertensive heart and chronic kidney disease with heart failure and stage 1 through stage 4 chronic kidney disease, or unspecified chronic kidney disease: Secondary | ICD-10-CM | POA: Diagnosis not present

## 2021-07-15 DIAGNOSIS — M353 Polymyalgia rheumatica: Secondary | ICD-10-CM | POA: Diagnosis not present

## 2021-07-15 DIAGNOSIS — M25551 Pain in right hip: Secondary | ICD-10-CM | POA: Diagnosis not present

## 2021-07-15 DIAGNOSIS — I5031 Acute diastolic (congestive) heart failure: Secondary | ICD-10-CM | POA: Diagnosis not present

## 2021-07-15 DIAGNOSIS — M797 Fibromyalgia: Secondary | ICD-10-CM | POA: Diagnosis not present

## 2021-07-15 DIAGNOSIS — G8929 Other chronic pain: Secondary | ICD-10-CM | POA: Diagnosis not present

## 2021-07-15 DIAGNOSIS — N184 Chronic kidney disease, stage 4 (severe): Secondary | ICD-10-CM | POA: Diagnosis not present

## 2021-07-15 NOTE — Telephone Encounter (Signed)
Patient's daughter has questions and concerns regarding diagnosis. Patient is scheduled for a f/u appt 07/22/21 and will discuss then

## 2021-07-15 NOTE — Telephone Encounter (Signed)
Left message for patient's daughter to call me back ?

## 2021-07-17 DIAGNOSIS — G8929 Other chronic pain: Secondary | ICD-10-CM | POA: Diagnosis not present

## 2021-07-17 DIAGNOSIS — M353 Polymyalgia rheumatica: Secondary | ICD-10-CM | POA: Diagnosis not present

## 2021-07-17 DIAGNOSIS — N184 Chronic kidney disease, stage 4 (severe): Secondary | ICD-10-CM | POA: Diagnosis not present

## 2021-07-17 DIAGNOSIS — I130 Hypertensive heart and chronic kidney disease with heart failure and stage 1 through stage 4 chronic kidney disease, or unspecified chronic kidney disease: Secondary | ICD-10-CM | POA: Diagnosis not present

## 2021-07-17 DIAGNOSIS — M797 Fibromyalgia: Secondary | ICD-10-CM | POA: Diagnosis not present

## 2021-07-17 DIAGNOSIS — M25551 Pain in right hip: Secondary | ICD-10-CM | POA: Diagnosis not present

## 2021-07-17 DIAGNOSIS — M7501 Adhesive capsulitis of right shoulder: Secondary | ICD-10-CM | POA: Diagnosis not present

## 2021-07-17 DIAGNOSIS — M7502 Adhesive capsulitis of left shoulder: Secondary | ICD-10-CM | POA: Diagnosis not present

## 2021-07-17 DIAGNOSIS — R42 Dizziness and giddiness: Secondary | ICD-10-CM | POA: Diagnosis not present

## 2021-07-17 DIAGNOSIS — I5031 Acute diastolic (congestive) heart failure: Secondary | ICD-10-CM | POA: Diagnosis not present

## 2021-07-18 DIAGNOSIS — M797 Fibromyalgia: Secondary | ICD-10-CM | POA: Diagnosis not present

## 2021-07-18 DIAGNOSIS — M353 Polymyalgia rheumatica: Secondary | ICD-10-CM | POA: Diagnosis not present

## 2021-07-18 DIAGNOSIS — R42 Dizziness and giddiness: Secondary | ICD-10-CM | POA: Diagnosis not present

## 2021-07-18 DIAGNOSIS — M25551 Pain in right hip: Secondary | ICD-10-CM | POA: Diagnosis not present

## 2021-07-18 DIAGNOSIS — I130 Hypertensive heart and chronic kidney disease with heart failure and stage 1 through stage 4 chronic kidney disease, or unspecified chronic kidney disease: Secondary | ICD-10-CM | POA: Diagnosis not present

## 2021-07-18 DIAGNOSIS — I5031 Acute diastolic (congestive) heart failure: Secondary | ICD-10-CM | POA: Diagnosis not present

## 2021-07-18 DIAGNOSIS — M7501 Adhesive capsulitis of right shoulder: Secondary | ICD-10-CM | POA: Diagnosis not present

## 2021-07-18 DIAGNOSIS — G8929 Other chronic pain: Secondary | ICD-10-CM | POA: Diagnosis not present

## 2021-07-18 DIAGNOSIS — N184 Chronic kidney disease, stage 4 (severe): Secondary | ICD-10-CM | POA: Diagnosis not present

## 2021-07-18 DIAGNOSIS — M7502 Adhesive capsulitis of left shoulder: Secondary | ICD-10-CM | POA: Diagnosis not present

## 2021-07-22 ENCOUNTER — Ambulatory Visit: Payer: Medicare HMO | Admitting: Internal Medicine

## 2021-07-22 ENCOUNTER — Other Ambulatory Visit: Payer: Self-pay

## 2021-07-22 ENCOUNTER — Encounter: Payer: Self-pay | Admitting: Internal Medicine

## 2021-07-22 ENCOUNTER — Ambulatory Visit (INDEPENDENT_AMBULATORY_CARE_PROVIDER_SITE_OTHER): Payer: Medicare HMO | Admitting: Internal Medicine

## 2021-07-22 VITALS — BP 126/78 | HR 77 | Temp 97.7°F | Resp 18 | Ht 61.0 in | Wt 155.0 lb

## 2021-07-22 DIAGNOSIS — R609 Edema, unspecified: Secondary | ICD-10-CM

## 2021-07-22 DIAGNOSIS — R7302 Impaired glucose tolerance (oral): Secondary | ICD-10-CM

## 2021-07-22 DIAGNOSIS — N184 Chronic kidney disease, stage 4 (severe): Secondary | ICD-10-CM

## 2021-07-22 DIAGNOSIS — I10 Essential (primary) hypertension: Secondary | ICD-10-CM

## 2021-07-22 DIAGNOSIS — E559 Vitamin D deficiency, unspecified: Secondary | ICD-10-CM | POA: Diagnosis not present

## 2021-07-22 NOTE — Patient Instructions (Addendum)
Ok to take the lasix (furosemide) Monday - Wed- Friday only  Please take the potassium pill only on days you take the furosemide  ,Please continue all other medications as before, and refills have been done if requested.  Please have the pharmacy call with any other refills you may need.  Please continue your efforts at being more active, low cholesterol diet, and weight control.  Please keep your appointments with your specialists as you may have planned - renal on Oct 18  Please make an Appointment to return in 4 months, or sooner if needed

## 2021-07-22 NOTE — Progress Notes (Signed)
Patient ID: [name], female   DOB: 04-29-1936, 85 y.o.   MRN: [id number]        Chief Complaint: follow up leg swelling, and ckd       HPI:  [name] is a 85 y.o. female here with familyl, overall doing ok with some persistent leg swelling but also unusual general weakness and fatigue, maybe slight dizziness.  Pt denies chest pain, increased sob or doe, wheezing, orthopnea, PND, increased LE swelling, palpitations, or syncope.   Pt denies polydipsia, polyuria, or new focal neuro s/s.   Pt denies fever, wt loss, night sweats, loss of appetite, or other constitutional symptoms  No other new complaints      Wt Readings from Last 3 Encounters:  07/22/21 155 lb (70.3 kg)  07/01/21 155 lb (70.3 kg)  06/27/21 153 lb (69.4 kg)   BP Readings from Last 3 Encounters:  07/22/21 126/78  07/01/21 (!) 165/88  06/27/21 (!) 142/78         Past Medical History:  Diagnosis Date   Allergic rhinitis 10/30/2016   Anemia    Asthma    Breast cancer of upper-outer quadrant of left female breast ([place]) 11/08/2013   ER/PR+ Her2- Left IDC    Chronic renal insufficiency    Chronic rhinitis    Colon polyp    Diastolic dysfunction 0/04/6225   DJD (degenerative joint disease)    Dyspnea    Full dentures    GERD (gastroesophageal reflux disease)    Hearing loss    Hypertension    Hyponatremia    Impaired glucose tolerance 07/18/2014   Memory loss    Morbid obesity ([place])    Poor circulation    Vertigo    Wears glasses    Past Surgical History:  Procedure Laterality Date   ABDOMINAL HYSTERECTOMY     BREAST LUMPECTOMY WITH NEEDLE LOCALIZATION AND AXILLARY SENTINEL LYMPH NODE BX Left 12/04/2013   Procedure: BREAST LUMPECTOMY WITH NEEDLE LOCALIZATION AND AXILLARY SENTINEL LYMPH NODE BX;  Surgeon: Shann Medal, MD;  Location: [facility];  Service: General;  Laterality: Left;   CATARACT EXTRACTION  2009   rt   COLONOSCOPY     EYE SURGERY Bilateral    cataract surgery    KNEE ARTHROSCOPY     both   LUMBAR LAMINECTOMY/DECOMPRESSION MICRODISCECTOMY Left 12/23/2017   Procedure: Left Lumbar One-Two Laminectomy with microdiscectomy;  Surgeon: Eustace Moore, MD;  Location: [facility];  Service: Neurosurgery;  Laterality: Left;  Left L1-2 Laminectomy with microdiscectomy   TONSILLECTOMY     TOTAL KNEE ARTHROPLASTY  2002   rt   TOTAL KNEE ARTHROPLASTY  2003   left   VESICOVAGINAL FISTULA CLOSURE W/ TAH  1980    reports that she has never smoked. She has never used smokeless tobacco. She reports that she does not drink alcohol and does not use drugs. family history includes Colon cancer in her mother; Healthy in her daughter; Heart attack (age of onset: 38) in her son; Lung cancer in her brother; Stomach cancer in her mother. Allergies  Allergen Reactions   Hydrocodone Itching   Lasix [Furosemide]     Dizziness.    Tizanidine Other (See Comments)    Dizzy and fall   Iron Hives and Other (See Comments)    Whelps, bad constipation Other reaction(s): Unknown   Current Outpatient Medications on File Prior to Visit  Medication Sig Dispense Refill   Albuterol Sulfate (PROAIR RESPICLICK) 333 (90 Base) MCG/ACT  AEPB 1 puff as needed     amLODipine (NORVASC) 5 MG tablet TAKE 1 TABLET BY MOUTH EVERY DAY 90 tablet 3   aspirin 81 MG tablet Take 81 mg by mouth daily.     diclofenac Sodium (VOLTAREN) 1 % GEL APPLY 2 GRAMS TO AFFECTED AREA 4 TIMES A DAY 100 g 5   Ensure (ENSURE) Take 1 Can by mouth 3 (three) times daily between meals. 237 mL 12   ergocalciferol (VITAMIN D2) 1.25 MG (50000 UT) capsule      FERREX 150 150 MG capsule TAKE 1 CAPSULE BY MOUTH TWICE A DAY 60 capsule 1   fluticasone-salmeterol (ADVAIR DISKUS) 250-50 MCG/ACT AEPB Inhale 1 puff into the lungs in the morning and at bedtime. 180 each 3   furosemide (LASIX) 40 MG tablet Take 1 tablet (40 mg total) by mouth daily. 90 tablet 3   gabapentin (NEURONTIN) 300 MG capsule TAKE 1 CAPSULE BY MOUTH THREE TIMES A DAY  90 capsule 5   Incontinence Supply Disposable (DEPEND UNDERWEAR SM/MED) MISC Use as directed four times per day 120 each 5   losartan (COZAAR) 100 MG tablet Take 1 tablet by mouth daily.     meclizine (ANTIVERT) 12.5 MG tablet TAKE 1 TABLET BY MOUTH THREE TIMES A DAY AS NEEDED FOR DIZZINESS 90 tablet 2   methocarbamol (ROBAXIN) 500 MG tablet Take 1 tablet (500 mg total) by mouth every 8 (eight) hours as needed for muscle spasms. 10 tablet 0   Multiple Vitamins-Minerals (CENTRUM SILVER PO) Take 1 tablet by mouth daily.     potassium chloride (KLOR-CON) 10 MEQ tablet TAKE 1 TABLET BY MOUTH EVERY DAY WHEN TAKING FUROSEMIDE 90 tablet 3   Potassium Gluconate 550 MG TABS      predniSONE (DELTASONE) 1 MG tablet Take 1 tablet (1 mg total) by mouth daily with breakfast. Along with 22m to equal 65mby mouth daily. 60 tablet 0   predniSONE (DELTASONE) 5 MG tablet Take 1 tablet (5 mg total) by mouth daily with breakfast. 30 tablet 0   traMADol (ULTRAM-ER) 200 MG 24 hr tablet TAKE 1 TABLET BY MOUTH EVERY DAY 90 tablet 1   triamcinolone (NASACORT AQ) 55 MCG/ACT AERO nasal inhaler Place 2 sprays into the nose daily. 1 Inhaler 12   triamcinolone cream (KENALOG) 0.1 % APPLY TO AFFECTED AREA TWICE A DAY 30 g 1   solifenacin (VESICARE) 5 MG tablet Take 1 tablet (5 mg total) by mouth daily. (Patient not taking: No sig reported) 90 tablet 3   tizanidine (ZANAFLEX) 2 MG capsule TAKE 1 CAPSULE BY MOUTH 3 TIMES A DAY AS NEEDED FOR MUSCLE SPASMS (Patient not taking: Reported on 07/22/2021) 40 capsule 1   No current facility-administered medications on file prior to visit.        ROS:  All others reviewed and negative.  Objective        PE:  BP 126/78   Pulse 77   Temp 97.7 F (36.5 C) (Oral)   Resp 18   Ht 5' 1" (1.549 m)   Wt 155 lb (70.3 kg)   SpO2 97%   BMI 29.29 kg/m                 Constitutional: Pt appears in NAD               HENT: Head: NCAT.                Right Ear: External ear normal.  Left Ear: External ear normal.                Eyes: . Pupils are equal, round, and reactive to light. Conjunctivae and EOM are normal               Nose: without d/c or deformity               Neck: Neck supple. Gross normal ROM               Cardiovascular: Normal rate and regular rhythm.                 Pulmonary/Chest: Effort normal and breath sounds without rales or wheezing.                Abd:  Soft, NT, ND, + BS, no organomegaly               Neurological: Pt is alert. At baseline orientation, motor grossly intact               Skin: Skin is warm. No rashes, no other new lesions, LE edema - none               Psychiatric: Pt behavior is normal without agitation   Micro: none  Cardiac tracings I have personally interpreted today:  none  Pertinent Radiological findings (summarize): none   Lab Results  Component Value Date   WBC 7.1 06/18/2021   HGB 11.7 (L) 06/18/2021   HCT 34.8 (L) 06/18/2021   PLT 239.0 06/18/2021   GLUCOSE 110 (H) 06/27/2021   CHOL 219 (H) 01/24/2021   TRIG 134 01/24/2021   HDL 83 01/24/2021   LDLDIRECT 109.0 08/01/2020   LDLCALC 111 (H) 01/24/2021   ALT 28 06/18/2021   AST 39 (H) 06/18/2021   NA 139 06/27/2021   K 3.9 06/27/2021   CL 98 06/27/2021   CREATININE 2.34 (H) 06/27/2021   BUN 39 (H) 06/27/2021   CO2 29 06/27/2021   TSH 0.86 01/24/2021   INR 1.35 01/17/2018   HGBA1C 6.1 06/18/2021   Assessment/Plan:  [name] is a 85 y.o. Black or African American [2] female with  has a past medical history of Allergic rhinitis (10/30/2016), Anemia, Asthma, Breast cancer of upper-outer quadrant of left female breast ([place]) (11/08/2013), Chronic renal insufficiency, Chronic rhinitis, Colon polyp, Diastolic dysfunction (1/44/3154), DJD (degenerative joint disease), Dyspnea, Full dentures, GERD (gastroesophageal reflux disease), Hearing loss, Hypertension, Hyponatremia, Impaired glucose tolerance (07/18/2014), Memory loss, Morbid obesity ([place]),  Poor circulation, Vertigo, and Wears glasses.  Vitamin D deficiency Last vitamin D Lab Results  Component Value Date   VD25OH 64 01/24/2021   Stable, cont oral replacement   CKD (chronic kidney disease) stage 4, GFR 15-29 ml/min (HCC) Lab Results  Component Value Date   CREATININE 2.34 (H) 06/27/2021   Stable overall, cont to avoid nephrotoxins', f/u renal  Essential hypertension BP Readings from Last 3 Encounters:  07/22/21 126/78  07/01/21 (!) 165/88  06/27/21 (!) 142/78   Stable, pt to continue medical treatment norvasc, losartan   Impaired glucose tolerance Lab Results  Component Value Date   HGBA1C 6.1 06/18/2021   Stable, pt to continue current medical treatment  - diet   Peripheral edema Suspect mild over diuresed; ok for reduced lasix to mon- wed- fri only, to f/u any worsening symptoms or concerns  Followup: Return in about 4 months (around 11/21/2021).  Felicia Cower, MD 07/25/2021 9:09 PM [facility]  [facility]

## 2021-07-22 NOTE — Assessment & Plan Note (Signed)
Last vitamin D Lab Results  Component Value Date   VD25OH 64 01/24/2021   Stable, cont oral replacement

## 2021-07-23 DIAGNOSIS — I5031 Acute diastolic (congestive) heart failure: Secondary | ICD-10-CM | POA: Diagnosis not present

## 2021-07-23 DIAGNOSIS — M7501 Adhesive capsulitis of right shoulder: Secondary | ICD-10-CM | POA: Diagnosis not present

## 2021-07-23 DIAGNOSIS — N184 Chronic kidney disease, stage 4 (severe): Secondary | ICD-10-CM | POA: Diagnosis not present

## 2021-07-23 DIAGNOSIS — M25551 Pain in right hip: Secondary | ICD-10-CM | POA: Diagnosis not present

## 2021-07-23 DIAGNOSIS — M797 Fibromyalgia: Secondary | ICD-10-CM | POA: Diagnosis not present

## 2021-07-23 DIAGNOSIS — G8929 Other chronic pain: Secondary | ICD-10-CM | POA: Diagnosis not present

## 2021-07-23 DIAGNOSIS — R42 Dizziness and giddiness: Secondary | ICD-10-CM | POA: Diagnosis not present

## 2021-07-23 DIAGNOSIS — M353 Polymyalgia rheumatica: Secondary | ICD-10-CM | POA: Diagnosis not present

## 2021-07-23 DIAGNOSIS — I130 Hypertensive heart and chronic kidney disease with heart failure and stage 1 through stage 4 chronic kidney disease, or unspecified chronic kidney disease: Secondary | ICD-10-CM | POA: Diagnosis not present

## 2021-07-23 DIAGNOSIS — M7502 Adhesive capsulitis of left shoulder: Secondary | ICD-10-CM | POA: Diagnosis not present

## 2021-07-25 ENCOUNTER — Encounter: Payer: Self-pay | Admitting: Internal Medicine

## 2021-07-25 NOTE — Assessment & Plan Note (Signed)
Lab Results  Component Value Date   CREATININE 2.34 (H) 06/27/2021   Stable overall, cont to avoid nephrotoxins', f/u renal

## 2021-07-25 NOTE — Assessment & Plan Note (Signed)
Suspect mild over diuresed; ok for reduced lasix to mon- wed- fri only, to f/u any worsening symptoms or concerns

## 2021-07-25 NOTE — Assessment & Plan Note (Signed)
BP Readings from Last 3 Encounters:  07/22/21 126/78  07/01/21 (!) 165/88  06/27/21 (!) 142/78   Stable, pt to continue medical treatment norvasc, losartan

## 2021-07-25 NOTE — Assessment & Plan Note (Signed)
Lab Results  Component Value Date   HGBA1C 6.1 06/18/2021   Stable, pt to continue current medical treatment  - diet

## 2021-07-27 ENCOUNTER — Other Ambulatory Visit: Payer: Self-pay | Admitting: Rheumatology

## 2021-07-27 ENCOUNTER — Emergency Department (HOSPITAL_COMMUNITY): Payer: Medicare HMO

## 2021-07-27 ENCOUNTER — Other Ambulatory Visit: Payer: Self-pay

## 2021-07-27 ENCOUNTER — Encounter (HOSPITAL_COMMUNITY): Payer: Self-pay

## 2021-07-27 ENCOUNTER — Inpatient Hospital Stay (HOSPITAL_COMMUNITY)
Admission: EM | Admit: 2021-07-27 | Discharge: 2021-08-08 | DRG: 071 | Disposition: A | Discharge status: Skilled Nursing Facility | Payer: Medicare HMO | Attending: Family Medicine | Admitting: Family Medicine

## 2021-07-27 DIAGNOSIS — T40425A Adverse effect of tramadol, initial encounter: Secondary | ICD-10-CM | POA: Diagnosis present

## 2021-07-27 DIAGNOSIS — R778 Other specified abnormalities of plasma proteins: Secondary | ICD-10-CM

## 2021-07-27 DIAGNOSIS — Z8249 Family history of ischemic heart disease and other diseases of the circulatory system: Secondary | ICD-10-CM

## 2021-07-27 DIAGNOSIS — R7989 Other specified abnormal findings of blood chemistry: Secondary | ICD-10-CM

## 2021-07-27 DIAGNOSIS — E669 Obesity, unspecified: Secondary | ICD-10-CM | POA: Diagnosis present

## 2021-07-27 DIAGNOSIS — Z888 Allergy status to other drugs, medicaments and biological substances status: Secondary | ICD-10-CM

## 2021-07-27 DIAGNOSIS — I10 Essential (primary) hypertension: Secondary | ICD-10-CM

## 2021-07-27 DIAGNOSIS — J9811 Atelectasis: Secondary | ICD-10-CM | POA: Diagnosis present

## 2021-07-27 DIAGNOSIS — R4182 Altered mental status, unspecified: Secondary | ICD-10-CM | POA: Diagnosis present

## 2021-07-27 DIAGNOSIS — R410 Disorientation, unspecified: Secondary | ICD-10-CM | POA: Diagnosis not present

## 2021-07-27 DIAGNOSIS — Z801 Family history of malignant neoplasm of trachea, bronchus and lung: Secondary | ICD-10-CM

## 2021-07-27 DIAGNOSIS — T426X5A Adverse effect of other antiepileptic and sedative-hypnotic drugs, initial encounter: Secondary | ICD-10-CM | POA: Diagnosis present

## 2021-07-27 DIAGNOSIS — N184 Chronic kidney disease, stage 4 (severe): Secondary | ICD-10-CM

## 2021-07-27 DIAGNOSIS — Z79899 Other long term (current) drug therapy: Secondary | ICD-10-CM

## 2021-07-27 DIAGNOSIS — M19041 Primary osteoarthritis, right hand: Secondary | ICD-10-CM | POA: Diagnosis present

## 2021-07-27 DIAGNOSIS — S0990XA Unspecified injury of head, initial encounter: Secondary | ICD-10-CM | POA: Diagnosis not present

## 2021-07-27 DIAGNOSIS — Z743 Need for continuous supervision: Secondary | ICD-10-CM | POA: Diagnosis not present

## 2021-07-27 DIAGNOSIS — M064 Inflammatory polyarthropathy: Secondary | ICD-10-CM | POA: Diagnosis present

## 2021-07-27 DIAGNOSIS — G9341 Metabolic encephalopathy: Principal | ICD-10-CM | POA: Diagnosis present

## 2021-07-27 DIAGNOSIS — M79671 Pain in right foot: Secondary | ICD-10-CM | POA: Diagnosis present

## 2021-07-27 DIAGNOSIS — R739 Hyperglycemia, unspecified: Secondary | ICD-10-CM | POA: Diagnosis present

## 2021-07-27 DIAGNOSIS — I5032 Chronic diastolic (congestive) heart failure: Secondary | ICD-10-CM

## 2021-07-27 DIAGNOSIS — R0602 Shortness of breath: Secondary | ICD-10-CM

## 2021-07-27 DIAGNOSIS — W19XXXA Unspecified fall, initial encounter: Secondary | ICD-10-CM | POA: Diagnosis not present

## 2021-07-27 DIAGNOSIS — G894 Chronic pain syndrome: Secondary | ICD-10-CM | POA: Diagnosis not present

## 2021-07-27 DIAGNOSIS — M19042 Primary osteoarthritis, left hand: Secondary | ICD-10-CM | POA: Diagnosis present

## 2021-07-27 DIAGNOSIS — I5031 Acute diastolic (congestive) heart failure: Secondary | ICD-10-CM

## 2021-07-27 DIAGNOSIS — T796XXA Traumatic ischemia of muscle, initial encounter: Secondary | ICD-10-CM

## 2021-07-27 DIAGNOSIS — Z6830 Body mass index (BMI) 30.0-30.9, adult: Secondary | ICD-10-CM

## 2021-07-27 DIAGNOSIS — Z800 Family history of malignant neoplasm of digestive organs: Secondary | ICD-10-CM

## 2021-07-27 DIAGNOSIS — M4312 Spondylolisthesis, cervical region: Secondary | ICD-10-CM | POA: Diagnosis not present

## 2021-07-27 DIAGNOSIS — D649 Anemia, unspecified: Secondary | ICD-10-CM | POA: Diagnosis not present

## 2021-07-27 DIAGNOSIS — E876 Hypokalemia: Secondary | ICD-10-CM | POA: Diagnosis not present

## 2021-07-27 DIAGNOSIS — Z20822 Contact with and (suspected) exposure to covid-19: Secondary | ICD-10-CM | POA: Diagnosis present

## 2021-07-27 DIAGNOSIS — B349 Viral infection, unspecified: Secondary | ICD-10-CM | POA: Diagnosis present

## 2021-07-27 DIAGNOSIS — M19011 Primary osteoarthritis, right shoulder: Secondary | ICD-10-CM | POA: Diagnosis present

## 2021-07-27 DIAGNOSIS — M47816 Spondylosis without myelopathy or radiculopathy, lumbar region: Secondary | ICD-10-CM | POA: Diagnosis not present

## 2021-07-27 DIAGNOSIS — T380X5A Adverse effect of glucocorticoids and synthetic analogues, initial encounter: Secondary | ICD-10-CM | POA: Diagnosis present

## 2021-07-27 DIAGNOSIS — R32 Unspecified urinary incontinence: Secondary | ICD-10-CM | POA: Diagnosis present

## 2021-07-27 DIAGNOSIS — I959 Hypotension, unspecified: Secondary | ICD-10-CM | POA: Diagnosis present

## 2021-07-27 DIAGNOSIS — M6282 Rhabdomyolysis: Secondary | ICD-10-CM | POA: Diagnosis not present

## 2021-07-27 DIAGNOSIS — R531 Weakness: Secondary | ICD-10-CM | POA: Diagnosis not present

## 2021-07-27 DIAGNOSIS — G9340 Encephalopathy, unspecified: Secondary | ICD-10-CM | POA: Diagnosis present

## 2021-07-27 DIAGNOSIS — Z043 Encounter for examination and observation following other accident: Secondary | ICD-10-CM | POA: Diagnosis not present

## 2021-07-27 DIAGNOSIS — Z7951 Long term (current) use of inhaled steroids: Secondary | ICD-10-CM

## 2021-07-27 DIAGNOSIS — I130 Hypertensive heart and chronic kidney disease with heart failure and stage 1 through stage 4 chronic kidney disease, or unspecified chronic kidney disease: Secondary | ICD-10-CM | POA: Diagnosis present

## 2021-07-27 DIAGNOSIS — M79672 Pain in left foot: Secondary | ICD-10-CM | POA: Diagnosis present

## 2021-07-27 DIAGNOSIS — G928 Other toxic encephalopathy: Secondary | ICD-10-CM | POA: Diagnosis present

## 2021-07-27 DIAGNOSIS — I6529 Occlusion and stenosis of unspecified carotid artery: Secondary | ICD-10-CM | POA: Diagnosis not present

## 2021-07-27 DIAGNOSIS — S199XXA Unspecified injury of neck, initial encounter: Secondary | ICD-10-CM | POA: Diagnosis not present

## 2021-07-27 DIAGNOSIS — Z7952 Long term (current) use of systemic steroids: Secondary | ICD-10-CM | POA: Diagnosis not present

## 2021-07-27 DIAGNOSIS — G8929 Other chronic pain: Secondary | ICD-10-CM | POA: Diagnosis present

## 2021-07-27 DIAGNOSIS — Z885 Allergy status to narcotic agent status: Secondary | ICD-10-CM

## 2021-07-27 DIAGNOSIS — R296 Repeated falls: Secondary | ICD-10-CM | POA: Diagnosis present

## 2021-07-27 DIAGNOSIS — D849 Immunodeficiency, unspecified: Secondary | ICD-10-CM | POA: Diagnosis present

## 2021-07-27 DIAGNOSIS — A419 Sepsis, unspecified organism: Secondary | ICD-10-CM | POA: Diagnosis present

## 2021-07-27 DIAGNOSIS — Z853 Personal history of malignant neoplasm of breast: Secondary | ICD-10-CM

## 2021-07-27 DIAGNOSIS — Z7982 Long term (current) use of aspirin: Secondary | ICD-10-CM

## 2021-07-27 DIAGNOSIS — J45901 Unspecified asthma with (acute) exacerbation: Secondary | ICD-10-CM | POA: Diagnosis not present

## 2021-07-27 DIAGNOSIS — R52 Pain, unspecified: Secondary | ICD-10-CM

## 2021-07-27 DIAGNOSIS — Z751 Person awaiting admission to adequate facility elsewhere: Secondary | ICD-10-CM

## 2021-07-27 DIAGNOSIS — M19012 Primary osteoarthritis, left shoulder: Secondary | ICD-10-CM | POA: Diagnosis present

## 2021-07-27 DIAGNOSIS — Z96653 Presence of artificial knee joint, bilateral: Secondary | ICD-10-CM | POA: Diagnosis present

## 2021-07-27 LAB — COMPREHENSIVE METABOLIC PANEL
ALT: 34 U/L (ref 0–44)
AST: 57 U/L — ABNORMAL HIGH (ref 15–41)
Albumin: 3.8 g/dL (ref 3.5–5.0)
Alkaline Phosphatase: 73 U/L (ref 38–126)
Anion gap: 12 (ref 5–15)
BUN: 28 mg/dL — ABNORMAL HIGH (ref 8–23)
CO2: 28 mmol/L (ref 22–32)
Calcium: 9.9 mg/dL (ref 8.9–10.3)
Chloride: 102 mmol/L (ref 98–111)
Creatinine, Ser: 1.37 mg/dL — ABNORMAL HIGH (ref 0.44–1.00)
GFR, Estimated: 38 mL/min — ABNORMAL LOW (ref >60)
Glucose, Bld: 143 mg/dL — ABNORMAL HIGH (ref 70–99)
Potassium: 3.7 mmol/L (ref 3.5–5.1)
Sodium: 142 mmol/L (ref 135–145)
Total Bilirubin: 1 mg/dL (ref 0.3–1.2)
Total Protein: 7.3 g/dL (ref 6.5–8.1)

## 2021-07-27 LAB — PROTIME-INR
INR: 1 (ref 0.8–1.2)
Prothrombin Time: 13.6 seconds (ref 11.4–15.2)

## 2021-07-27 LAB — CBC WITH DIFFERENTIAL/PLATELET
Abs Immature Granulocytes: 0.11 10*3/uL — ABNORMAL HIGH (ref 0.00–0.07)
Basophils Absolute: 0 10*3/uL (ref 0.0–0.1)
Basophils Relative: 0 %
Eosinophils Absolute: 0 10*3/uL (ref 0.0–0.5)
Eosinophils Relative: 0 %
HCT: 38.2 % (ref 36.0–46.0)
Hemoglobin: 12 g/dL (ref 12.0–15.0)
Immature Granulocytes: 1 %
Lymphocytes Relative: 7 %
Lymphs Abs: 0.9 10*3/uL (ref 0.7–4.0)
MCH: 31.7 pg (ref 26.0–34.0)
MCHC: 31.4 g/dL (ref 30.0–36.0)
MCV: 100.8 fL — ABNORMAL HIGH (ref 80.0–100.0)
Monocytes Absolute: 0.8 10*3/uL (ref 0.1–1.0)
Monocytes Relative: 6 %
Neutro Abs: 10.8 10*3/uL — ABNORMAL HIGH (ref 1.7–7.7)
Neutrophils Relative %: 86 %
Platelets: 273 10*3/uL (ref 150–400)
RBC: 3.79 MIL/uL — ABNORMAL LOW (ref 3.87–5.11)
RDW: 13.6 % (ref 11.5–15.5)
WBC: 12.6 10*3/uL — ABNORMAL HIGH (ref 4.0–10.5)
nRBC: 0 % (ref 0.0–0.2)

## 2021-07-27 LAB — RAPID URINE DRUG SCREEN, HOSP PERFORMED
Amphetamines: NOT DETECTED
Barbiturates: NOT DETECTED
Benzodiazepines: NOT DETECTED
Cocaine: NOT DETECTED
Opiates: NOT DETECTED
Tetrahydrocannabinol: NOT DETECTED

## 2021-07-27 LAB — RESP PANEL BY RT-PCR (FLU A&B, COVID) ARPGX2
Influenza A by PCR: NEGATIVE
Influenza B by PCR: NEGATIVE
SARS Coronavirus 2 by RT PCR: NEGATIVE

## 2021-07-27 LAB — URINALYSIS, ROUTINE W REFLEX MICROSCOPIC
Bilirubin Urine: NEGATIVE
Glucose, UA: NEGATIVE mg/dL
Ketones, ur: 5 mg/dL — AB
Leukocytes,Ua: NEGATIVE
Nitrite: NEGATIVE
Protein, ur: NEGATIVE mg/dL
Specific Gravity, Urine: 1.009 (ref 1.005–1.030)
pH: 6 (ref 5.0–8.0)

## 2021-07-27 LAB — TROPONIN I (HIGH SENSITIVITY)
Troponin I (High Sensitivity): 50 ng/L — ABNORMAL HIGH (ref <18)
Troponin I (High Sensitivity): 54 ng/L — ABNORMAL HIGH (ref <18)

## 2021-07-27 LAB — LACTIC ACID, PLASMA
Lactic Acid, Venous: 2.4 mmol/L (ref 0.5–1.9)
Lactic Acid, Venous: 1.5 mmol/L (ref 0.5–1.9)

## 2021-07-27 LAB — CK: Total CK: 1200 U/L — ABNORMAL HIGH (ref 38–234)

## 2021-07-27 MED ORDER — MOMETASONE FURO-FORMOTEROL FUM 200-5 MCG/ACT IN AERO
2.0000 | INHALATION_SPRAY | Freq: Two times a day (BID) | RESPIRATORY_TRACT | Status: DC
Start: 2021-07-27 — End: 2021-08-09
  Administered 2021-07-28 – 2021-08-08 (×21): 2 via RESPIRATORY_TRACT
  Filled 2021-07-27 (×2): qty 8.8

## 2021-07-27 MED ORDER — SODIUM CHLORIDE 0.9 % IV BOLUS
500.0000 mL | Freq: Once | INTRAVENOUS | Status: AC
  Administered 2021-07-27: 500 mL via INTRAVENOUS

## 2021-07-27 MED ORDER — VANCOMYCIN HCL 1500 MG/300ML IV SOLN
1500.0000 mg | Freq: Once | INTRAVENOUS | Status: AC
  Administered 2021-07-27: 1500 mg via INTRAVENOUS
  Filled 2021-07-27: qty 300

## 2021-07-27 MED ORDER — ONDANSETRON HCL 4 MG PO TABS: 4.0000 mg | ORAL_TABLET | Freq: Four times a day (QID) | ORAL | Status: DC | PRN

## 2021-07-27 MED ORDER — MONTELUKAST SODIUM 10 MG PO TABS
10.0000 mg | ORAL_TABLET | Freq: Every day | ORAL | Status: DC
  Administered 2021-07-28 – 2021-08-07 (×10): 10 mg via ORAL
  Filled 2021-07-27 (×11): qty 1

## 2021-07-27 MED ORDER — SODIUM CHLORIDE 0.9 % IV BOLUS
500.0000 mL | Freq: Once | INTRAVENOUS | Status: AC
Start: 2021-07-27 — End: 2021-07-27
  Administered 2021-07-27: 500 mL via INTRAVENOUS

## 2021-07-27 MED ORDER — ADULT MULTIVITAMIN W/MINERALS CH
1.0000 | ORAL_TABLET | Freq: Every day | ORAL | Status: DC
  Administered 2021-07-28 – 2021-08-08 (×12): 1 via ORAL
  Filled 2021-07-27 (×12): qty 1

## 2021-07-27 MED ORDER — SODIUM CHLORIDE 0.9 % IV SOLN
1.0000 g | Freq: Once | INTRAVENOUS | Status: AC
  Administered 2021-07-27: 1 g via INTRAVENOUS
  Filled 2021-07-27: qty 10

## 2021-07-27 MED ORDER — ALBUTEROL SULFATE (2.5 MG/3ML) 0.083% IN NEBU
2.5000 mg | INHALATION_SOLUTION | Freq: Four times a day (QID) | RESPIRATORY_TRACT | Status: DC | PRN
  Administered 2021-07-29 – 2021-08-08 (×9): 2.5 mg via RESPIRATORY_TRACT
  Filled 2021-07-27 (×9): qty 3

## 2021-07-27 MED ORDER — ASPIRIN EC 81 MG PO TBEC
81.0000 mg | DELAYED_RELEASE_TABLET | Freq: Every morning | ORAL | Status: DC
  Administered 2021-07-28 – 2021-08-08 (×11): 81 mg via ORAL
  Filled 2021-07-27 (×10): qty 1

## 2021-07-27 MED ORDER — PANTOPRAZOLE SODIUM 40 MG PO TBEC
40.0000 mg | DELAYED_RELEASE_TABLET | Freq: Every day | ORAL | Status: DC
  Administered 2021-07-28 – 2021-08-08 (×13): 40 mg via ORAL
  Filled 2021-07-27 (×13): qty 1

## 2021-07-27 MED ORDER — GABAPENTIN 300 MG PO CAPS
300.0000 mg | ORAL_CAPSULE | Freq: Three times a day (TID) | ORAL | Status: DC
  Administered 2021-07-28 (×3): 300 mg via ORAL
  Filled 2021-07-27 (×4): qty 1

## 2021-07-27 MED ORDER — LOSARTAN POTASSIUM 50 MG PO TABS
100.0000 mg | ORAL_TABLET | Freq: Every morning | ORAL | Status: DC
  Administered 2021-07-28 – 2021-07-29 (×2): 100 mg via ORAL
  Filled 2021-07-27: qty 2
  Filled 2021-07-27: qty 4

## 2021-07-27 MED ORDER — ENSURE ENLIVE PO LIQD
237.0000 mL | Freq: Two times a day (BID) | ORAL | Status: DC
  Administered 2021-07-28 – 2021-08-08 (×15): 237 mL via ORAL
  Filled 2021-07-27 (×2): qty 237

## 2021-07-27 MED ORDER — ACETAMINOPHEN 650 MG RE SUPP
650.0000 mg | Freq: Four times a day (QID) | RECTAL | Status: DC | PRN
  Administered 2021-07-29: 650 mg via RECTAL
  Filled 2021-07-27: qty 1

## 2021-07-27 MED ORDER — AMLODIPINE BESYLATE 5 MG PO TABS
5.0000 mg | ORAL_TABLET | Freq: Every morning | ORAL | Status: DC
  Administered 2021-07-28 – 2021-07-29 (×2): 5 mg via ORAL
  Filled 2021-07-27 (×2): qty 1

## 2021-07-27 MED ORDER — ENOXAPARIN SODIUM 30 MG/0.3ML IJ SOSY
30.0000 mg | PREFILLED_SYRINGE | INTRAMUSCULAR | Status: DC
  Administered 2021-07-28 – 2021-08-02 (×6): 30 mg via SUBCUTANEOUS
  Filled 2021-07-27 (×6): qty 0.3

## 2021-07-27 MED ORDER — ONDANSETRON HCL 4 MG/2ML IJ SOLN
4.0000 mg | Freq: Four times a day (QID) | INTRAMUSCULAR | Status: DC | PRN
  Administered 2021-08-01: 4 mg via INTRAVENOUS
  Filled 2021-07-27: qty 2

## 2021-07-27 MED ORDER — PREDNISONE 5 MG PO TABS
6.0000 mg | ORAL_TABLET | Freq: Every day | ORAL | Status: DC
  Administered 2021-07-28 – 2021-08-08 (×13): 6 mg via ORAL
  Filled 2021-07-27 (×15): qty 1

## 2021-07-27 MED ORDER — SODIUM CHLORIDE 0.9 % IV BOLUS
1000.0000 mL | Freq: Once | INTRAVENOUS | Status: AC
  Administered 2021-07-27: 1000 mL via INTRAVENOUS

## 2021-07-27 MED ORDER — ACETAMINOPHEN 325 MG PO TABS
650.0000 mg | ORAL_TABLET | Freq: Four times a day (QID) | ORAL | Status: DC | PRN
  Administered 2021-07-29 – 2021-08-08 (×5): 650 mg via ORAL
  Filled 2021-07-27 (×6): qty 2

## 2021-07-27 NOTE — ED Triage Notes (Addendum)
Per EMS, patient from home, found disoriented on the floor by daughter. Patient does not recall fall. Unknown time of fall. Daughter reports last speaking with patient at 1800 last night. Denies pain. A&Ox4 with EMS. However daughter reports patient is not to baseline.  20g L AC

## 2021-07-27 NOTE — H&P (Signed)
History and Physical    [name] YDX:[id number] DOB: 23-May-1936 DOA: 07/27/2021  PCP: Biagio Borg, MD  Patient coming from: Home  I have personally briefly reviewed patient's old medical records in [place]  Chief Complaint: AMS  HPI: [name] is a 85 y.o. female with medical history significant of dCHF, HTN, CKD stage 3-4, chronic steroid use.  Pt with generalized weakness and fatigue for a couple of weeks now.  Waxing and waning mental status per family.  ? If MCI.  Pt presents to the ED with c/o fall vs syncope with AMS.  At baseline lives alone, walks with walker.  Daughter last spoke with pt yesterday afternoon.  Called pt today, pt wasn't answering phone so daughter went to house.  Found pt on floor, down for unknown period of time.  Pt was apparently confused, didn't even realize she had fallen or was on floor.  Mental status had improved and she was AAOX3 by time of arrival to ED though still with slow mentation.  ROS positive for malodorous urine per family.  Pt denies any complaints: no headache, CP, SOB, abd pain, back pain.   ED Course: WBC 12.6k, no other SIRS. Initial lactate 2.4, improved to 1.5 after 2L IVF [place].  Trops 50 and 54.  Creat 1.37 much improved from the 2.3 x1 month ago (the latter was likely due to over diuresis and too much lasix, the dose of which has since been cut down to 3 times weekly, See PCPs note from 9/27).  CXR neg, UA neg, UDS neg, COVID neg.  CTH neg.  CPK = 1200   Review of Systems: As per HPI, otherwise all review of systems negative.  Past Medical History:  Diagnosis Date   Allergic rhinitis 10/30/2016   Anemia    Asthma    Breast cancer of upper-outer quadrant of left female breast ([place]) 11/08/2013   ER/PR+ Her2- Left IDC    Chronic renal insufficiency    Chronic rhinitis    Colon polyp    Diastolic dysfunction 05/14/9469   DJD (degenerative joint disease)    Dyspnea    Full dentures     GERD (gastroesophageal reflux disease)    Hearing loss    Hypertension    Hyponatremia    Impaired glucose tolerance 07/18/2014   Memory loss    Morbid obesity ([place])    Poor circulation    Vertigo    Wears glasses     Past Surgical History:  Procedure Laterality Date   ABDOMINAL HYSTERECTOMY     BREAST LUMPECTOMY WITH NEEDLE LOCALIZATION AND AXILLARY SENTINEL LYMPH NODE BX Left 12/04/2013   Procedure: BREAST LUMPECTOMY WITH NEEDLE LOCALIZATION AND AXILLARY SENTINEL LYMPH NODE BX;  Surgeon: Shann Medal, MD;  Location: [facility];  Service: General;  Laterality: Left;   CATARACT EXTRACTION  2009   rt   COLONOSCOPY     EYE SURGERY Bilateral    cataract surgery   KNEE ARTHROSCOPY     both   LUMBAR LAMINECTOMY/DECOMPRESSION MICRODISCECTOMY Left 12/23/2017   Procedure: Left Lumbar One-Two Laminectomy with microdiscectomy;  Surgeon: Eustace Moore, MD;  Location: [facility];  Service: Neurosurgery;  Laterality: Left;  Left L1-2 Laminectomy with microdiscectomy   TONSILLECTOMY     TOTAL KNEE ARTHROPLASTY  2002   rt   TOTAL KNEE ARTHROPLASTY  2003   left   VESICOVAGINAL FISTULA CLOSURE W/ TAH  1980     reports that she has  never smoked. She has never used smokeless tobacco. She reports that she does not drink alcohol and does not use drugs.  Allergies  Allergen Reactions   Hydrocodone Itching   Lasix [Furosemide] Other (See Comments)    Dizziness.    Tizanidine Other (See Comments)    Dizzy and fall   Iron Hives and Other (See Comments)     bad constipation     Family History  Problem Relation Age of Onset   Colon cancer Mother        in her 30's   Stomach cancer Mother    Lung cancer Brother        was a smoker   Heart attack Son 52   Healthy Daughter      Prior to Admission medications   Medication Sig Start Date End Date Taking? Authorizing Provider  albuterol (VENTOLIN HFA) 108 (90 Base) MCG/ACT inhaler Inhale 1 puff into the lungs every 6 (six)  hours as needed for wheezing or shortness of breath.   Yes [provider]  amLODipine (NORVASC) 5 MG tablet TAKE 1 TABLET BY MOUTH EVERY DAY Patient taking differently: Take 5 mg by mouth every morning. 10/08/20  Yes Biagio Borg, MD  aspirin EC 81 MG tablet Take 81 mg by mouth every morning. Swallow whole.   Yes [provider]  Cholecalciferol (VITAMIN D3 PO) Take 1 tablet by mouth daily.   Yes [provider]  diclofenac Sodium (VOLTAREN) 1 % GEL APPLY 2 GRAMS TO AFFECTED AREA 4 TIMES A DAY Patient taking differently: Apply 1 application topically 4 (four) times daily as needed (pain). 05/08/21  Yes Biagio Borg, MD  Ensure (ENSURE) Take 1 Can by mouth 3 (three) times daily between meals. Patient taking differently: Take 237 mLs by mouth See admin instructions. Drink one can (237 mls) by mouth once or twice daily 05/23/19  Yes Biagio Borg, MD  fluticasone-salmeterol (ADVAIR DISKUS) 250-50 MCG/ACT AEPB Inhale 1 puff into the lungs in the morning and at bedtime. 05/08/21  Yes Biagio Borg, MD  furosemide (LASIX) 40 MG tablet Take 1 tablet (40 mg total) by mouth daily. Patient taking differently: Take 40 mg by mouth every Monday, Wednesday, and Friday. 07/10/21  Yes Biagio Borg, MD  gabapentin (NEURONTIN) 300 MG capsule TAKE 1 CAPSULE BY MOUTH THREE TIMES A DAY Patient taking differently: Take 300 mg by mouth 3 (three) times daily. 05/20/21  Yes Biagio Borg, MD  losartan (COZAAR) 100 MG tablet Take 100 mg by mouth every morning. 02/05/21  Yes [provider]  Magnesium Gluconate 500 (27 Mg) MG TABS Take 500 mg by mouth 2 (two) times daily.   Yes [provider]  meclizine (ANTIVERT) 12.5 MG tablet TAKE 1 TABLET BY MOUTH THREE TIMES A DAY AS NEEDED FOR DIZZINESS Patient taking differently: Take 12.5 mg by mouth 3 (three) times daily as needed for dizziness. 03/03/21  Yes Biagio Borg, MD  montelukast (SINGULAIR) 10 MG tablet Take 10 mg by mouth at  bedtime.   Yes [provider]  Multiple Vitamin (MULTIVITAMIN WITH MINERALS) TABS tablet Take 1 tablet by mouth daily. Centrum Silver   Yes [provider]  pantoprazole (PROTONIX) 40 MG tablet Take 40 mg by mouth daily.   Yes [provider]  potassium chloride (KLOR-CON) 10 MEQ tablet TAKE 1 TABLET BY MOUTH EVERY DAY WHEN TAKING FUROSEMIDE Patient taking differently: Take 10 mEq by mouth every Monday, Wednesday, and Friday. 06/07/21  Yes Biagio Borg, MD  predniSONE (DELTASONE) 1 MG tablet Take 1 tablet (1 mg total) by mouth daily with breakfast. Along with $RemoveBe'5mg'QdCDzYoUC$  to equal $Remove'6mg'yasvKHc$  by mouth daily. Patient taking differently: Take 1 mg by mouth daily with breakfast. Take with a 5 mg tablet for a total dose of 6 mg daily 07/01/21  Yes Deveshwar, Abel Presto, MD  predniSONE (DELTASONE) 5 MG tablet Take 1 tablet (5 mg total) by mouth daily with breakfast. Patient taking differently: Take 5 mg by mouth daily with breakfast. Take with a 1 mg tablet for a total dose of 6 mg daily 07/01/21  Yes Deveshwar, Abel Presto, MD  traMADol (ULTRAM-ER) 200 MG 24 hr tablet TAKE 1 TABLET BY MOUTH EVERY DAY Patient taking differently: Take 200 mg by mouth every morning. 04/29/21  Yes Biagio Borg, MD  triamcinolone cream (KENALOG) 0.1 % APPLY TO AFFECTED AREA TWICE A DAY Patient taking differently: Apply 1 application topically 2 (two) times daily as needed (rash/irritation). 06/29/21  Yes Biagio Borg, MD  Incontinence Supply Disposable (DEPEND UNDERWEAR SM/MED) MISC Use as directed four times per day 05/23/19   Biagio Borg, MD    Physical Exam: Vitals:   07/27/21 1631 07/27/21 1800 07/27/21 2053 07/27/21 2200  BP: (!) 170/89 (!) 186/99 (!) 170/103 (!) 178/115  Pulse: 87 86 94 86  Resp: 20 (!) $Remo'25 19 17  'RHJdH$ Temp: 97.6 F (36.4 C)     TempSrc: Oral     SpO2: 94% 97% 96% 94%  Weight:      Height:        Constitutional: NAD, calm, comfortable Eyes: PERRL, lids and conjunctivae normal ENMT: Mucous  membranes are moist. Posterior pharynx clear of any exudate or lesions.Normal dentition.  Neck: normal, supple, no masses, no thyromegaly Respiratory: clear to auscultation bilaterally, no wheezing, no crackles. Normal respiratory effort. No accessory muscle use.  Cardiovascular: Regular rate and rhythm, no murmurs / rubs / gallops. No extremity edema. 2+ pedal pulses. No carotid bruits.  Abdomen: no tenderness, no masses palpated. No hepatosplenomegaly. Bowel sounds positive.  Musculoskeletal: no clubbing / cyanosis. No joint deformity upper and lower extremities. Good ROM, no contractures. Normal muscle tone.  Skin: no rashes, lesions, ulcers. No induration Neurologic: CN 2-12 grossly intact. Sensation intact, DTR normal. Strength 5/5 in all 4.  Psychiatric: Slow mentation.. Alert and oriented x 3. Normal mood.    Labs on Admission: I have personally reviewed following labs and imaging studies  CBC: Recent Labs  Lab 07/27/21 1640  WBC 12.6*  NEUTROABS 10.8*  HGB 12.0  HCT 38.2  MCV 100.8*  PLT 962   Basic Metabolic Panel: Recent Labs  Lab 07/27/21 1640  NA 142  K 3.7  CL 102  CO2 28  GLUCOSE 143*  BUN 28*  CREATININE 1.37*  CALCIUM 9.9   GFR: Estimated Creatinine Clearance: 27.2 mL/min (A) (by C-G formula based on SCr of 1.37 mg/dL (H)). Liver Function Tests: Recent Labs  Lab 07/27/21 1640  AST 57*  ALT 34  ALKPHOS 73  BILITOT 1.0  PROT 7.3  ALBUMIN 3.8   No results for input(s): LIPASE, AMYLASE in the last 168 hours. No results for input(s): AMMONIA in the last 168 hours. Coagulation Profile: Recent Labs  Lab 07/27/21 1640  INR 1.0   Cardiac Enzymes: Recent Labs  Lab 07/27/21 1640  CKTOTAL 1,200*   BNP (last 3 results) Recent Labs    06/18/21 1648  PROBNP 56.0   HbA1C: No results for input(s):  HGBA1C in the last 72 hours. CBG: No results for input(s): GLUCAP in the last 168 hours. Lipid Profile: No results for input(s): CHOL, HDL, LDLCALC,  TRIG, CHOLHDL, LDLDIRECT in the last 72 hours. Thyroid Function Tests: No results for input(s): TSH, T4TOTAL, FREET4, T3FREE, THYROIDAB in the last 72 hours. Anemia Panel: No results for input(s): VITAMINB12, FOLATE, FERRITIN, TIBC, IRON, RETICCTPCT in the last 72 hours. Urine analysis:    Component Value Date/Time   COLORURINE YELLOW 07/27/2021 2053   APPEARANCEUR CLEAR 07/27/2021 2053   LABSPEC 1.009 07/27/2021 2053   PHURINE 6.0 07/27/2021 2053   GLUCOSEU NEGATIVE 07/27/2021 2053   GLUCOSEU NEGATIVE 08/01/2020 1620   HGBUR MODERATE (A) 07/27/2021 2053   BILIRUBINUR NEGATIVE 07/27/2021 2053   KETONESUR 5 (A) 07/27/2021 2053   PROTEINUR NEGATIVE 07/27/2021 2053   UROBILINOGEN 0.2 08/01/2020 1620   NITRITE NEGATIVE 07/27/2021 2053   LEUKOCYTESUR NEGATIVE 07/27/2021 2053    Radiological Exams on Admission: DG Chest 2 View  Result Date: 07/27/2021 CLINICAL DATA:  Fall. EXAM: CHEST - 2 VIEW COMPARISON:  Chest x-ray 06/18/2021. FINDINGS: The heart size and mediastinal contours are within normal limits. Both lungs are clear. The visualized skeletal structures are unremarkable. IMPRESSION: No active cardiopulmonary disease. Electronically Signed   By: Ronney Asters M.D.   On: 07/27/2021 17:54   DG Pelvis 1-2 Views  Result Date: 07/27/2021 CLINICAL DATA:  Fall. EXAM: PELVIS - 1-2 VIEW COMPARISON:  Right hip x-ray 06/27/2021. FINDINGS: There is no evidence of pelvic fracture or diastasis. No pelvic bone lesions are seen. There are degenerative changes of the lower lumbar spine and sacroiliac joints. Vascular calcifications are noted in the soft tissues. There is a large amount of stool in the rectum. IMPRESSION: Negative. Electronically Signed   By: Ronney Asters M.D.   On: 07/27/2021 17:55   CT HEAD WO CONTRAST (5MM)  Result Date: 07/27/2021 CLINICAL DATA:  Fall with head and neck injury. EXAM: CT HEAD WITHOUT CONTRAST CT CERVICAL SPINE WITHOUT CONTRAST TECHNIQUE: Multidetector CT imaging  of the head and cervical spine was performed following the standard protocol without intravenous contrast. Multiplanar CT image reconstructions of the cervical spine were also generated. COMPARISON:  CT head and cervical spine dated 01/28/2018. FINDINGS: CT HEAD FINDINGS Brain: No evidence of acute infarction, hemorrhage, hydrocephalus, extra-axial collection or mass lesion/mass effect. Periventricular white matter hypoattenuation likely represents chronic small vessel ischemic disease. There is mild cerebral volume loss with associated ex vacuo dilatation. Vascular: There are vascular calcifications in the carotid siphons. Skull: Normal. Negative for fracture or focal lesion. Sinuses/Orbits: No acute finding. Other: None. CT CERVICAL SPINE FINDINGS Alignment: There is 4 mm anterolisthesis of C3 on C4, unchanged. There is mild dextrocurvature centered at C3-4 which is similar to prior exam and may be positional. Skull base and vertebrae: No acute fracture. No primary bone lesion or focal pathologic process. Soft tissues and spinal canal: No prevertebral fluid or swelling. No visible canal hematoma. Disc levels: Up to severe multilevel degenerative disc and joint disease. Upper chest: Negative. Other: None. IMPRESSION: 1. No acute intracranial process. 2. No acute osseous injury in the cervical spine. Electronically Signed   By: Zerita Boers M.D.   On: 07/27/2021 19:02   CT Cervical Spine Wo Contrast  Result Date: 07/27/2021 CLINICAL DATA:  Fall with head and neck injury. EXAM: CT HEAD WITHOUT CONTRAST CT CERVICAL SPINE WITHOUT CONTRAST TECHNIQUE: Multidetector CT imaging of the head and cervical spine was performed following the standard protocol without intravenous  contrast. Multiplanar CT image reconstructions of the cervical spine were also generated. COMPARISON:  CT head and cervical spine dated 01/28/2018. FINDINGS: CT HEAD FINDINGS Brain: No evidence of acute infarction, hemorrhage, hydrocephalus,  extra-axial collection or mass lesion/mass effect. Periventricular white matter hypoattenuation likely represents chronic small vessel ischemic disease. There is mild cerebral volume loss with associated ex vacuo dilatation. Vascular: There are vascular calcifications in the carotid siphons. Skull: Normal. Negative for fracture or focal lesion. Sinuses/Orbits: No acute finding. Other: None. CT CERVICAL SPINE FINDINGS Alignment: There is 4 mm anterolisthesis of C3 on C4, unchanged. There is mild dextrocurvature centered at C3-4 which is similar to prior exam and may be positional. Skull base and vertebrae: No acute fracture. No primary bone lesion or focal pathologic process. Soft tissues and spinal canal: No prevertebral fluid or swelling. No visible canal hematoma. Disc levels: Up to severe multilevel degenerative disc and joint disease. Upper chest: Negative. Other: None. IMPRESSION: 1. No acute intracranial process. 2. No acute osseous injury in the cervical spine. Electronically Signed   By: Zerita Boers M.D.   On: 07/27/2021 19:02    EKG: Independently reviewed.  Assessment/Plan Principal Problem:   Altered mental status Active Problems:   Essential hypertension   CKD (chronic kidney disease) stage 4, GFR 15-29 ml/min (HCC)   Chronic pain   Long term (current) use of systemic steroids   Chronic diastolic CHF (congestive heart failure) (HCC)    AMS - Acute encephalopathy vs progressive chronic dementia. Polypharmacy (AMS due to neurontin and ultram in setting of CKD) also possible Neg or unimpressive work up thus far: COVID and flu CMP UA CXR CT head UDS Has WBC of 12k, no other SIRS Work up ordered and pending still: BCx UCx Ammonia MRI brain TSH Repeat CBC/CMP in AM Mild CPK elevation - EDP gave 2L bolus Hold off on further IVF for the moment Repeat CPK in AM Mild trop elevation - Tele monitor No CP nor coronary symptoms Getting 2d echo dCHF - Other than the slight  trop elevation, no evidence of acute decompensation Will hold lasix for the moment CPS - Cont neurontin Holding ultram for the moment CKD 3b - Significant creat improvement over past month with reducing the lasix dose to 3 times weekly HTN - Cont home BP meds  DVT prophylaxis: Lovenox Code Status: Full Family Communication: Daughter at bedside Disposition Plan: TBD, pending AMS work up and (hopefully) improvement Consults called: None Admission status: Place in obs   Iya Hamed, [facility]  How to contact the [facility] Attending or Consulting provider [place] or covering provider during after hours [place], for this patient?  Check the care team in [facility] and look for a) attending/consulting [facility] provider listed and b) the [facility] team listed Log into [email or web address]  Amion Physician Scheduling and messaging for groups and whole hospitals  On call and physician scheduling software for group practices, residents, hospitalists and other medical providers for call, clinic, rotation and shift schedules. On[employer] is a hospital-wide system for scheduling doctors and paging doctors on call. EasyPlot is for scientific plotting and data analysis.  [email or web address]  and use [facility]'s universal password to access. If you do not have the password, please contact the hospital operator.  Locate the [facility] provider you are looking for under [facility] and page to a number that you can be directly reached. If you still have difficulty reaching the provider, please page the [facility] (Director on Call) for  the Hospitalists listed on amion for assistance.  07/27/2021, 11:42 PM

## 2021-07-27 NOTE — Progress Notes (Signed)
A consult was received from an ED physician for Vancomycin per pharmacy dosing.  The patient's profile has been reviewed for ht/wt/allergies/indication/available labs.    A one time order has been placed for Vancomycin 1500mg .  Further antibiotics/pharmacy consults should be ordered by admitting physician if indicated.                       Thank you,  Minda Ditto PharmD 07/27/2021  9:10 PM

## 2021-07-27 NOTE — ED Provider Notes (Signed)
[facility] DEPT Provider Note   CSN: [id number] Arrival date & time: 07/27/21  1600    History AMS, Fall   Trent DEREONNA LENSING is a 85 y.o. female with history significant for breast cancer, renal insufficiency, hypertension who presents for evaluation of fall vs syncope and altered mental status.  Apparently daughter talk with patient yesterday afternoon.  Went to call patient today who is not answering the phone and subsequently found on the floor for unknown duration of time.  Patient does not remember what happened.  Apparently confused with Fire however was A/O x3 by the time EMS arrived. Per EMS she was strong urine.  She walks with a walker at baseline.  She denies any complaints.  No headache, chest pain, shortness of breath, abdominal pain, back pain.  Denies additional aggravating or alleviating factors. No recent changes in meds per daughter.  Per EMS family states patient is still not at baseline despite ANO x3. Malodorous, dark urine with ems  Level 5 caveat-altered mental status  HPI     Past Medical History:  Diagnosis Date  . Allergic rhinitis 10/30/2016  . Anemia   . Asthma   . Breast cancer of upper-outer quadrant of left female breast ([place]) 11/08/2013   ER/PR+ Her2- Left IDC   . Chronic renal insufficiency   . Chronic rhinitis   . Colon polyp   . Diastolic dysfunction 02/16/5360  . DJD (degenerative joint disease)   . Dyspnea   . Full dentures   . GERD (gastroesophageal reflux disease)   . Hearing loss   . Hypertension   . Hyponatremia   . Impaired glucose tolerance 07/18/2014  . Memory loss   . Morbid obesity ([place])   . Poor circulation   . Vertigo   . Wears glasses     Patient Active Problem List   Diagnosis Date Noted  . Altered mental status 07/27/2021  . Chronic diastolic CHF (congestive heart failure) ([place]) 07/27/2021  . Right hip pain 06/27/2021  . Right knee pain 06/27/2021  . Acute diastolic congestive heart  failure ([place]) 06/18/2021  . Calcium pyrophosphate deposition disease 02/12/2021  . Fibromyalgia 02/12/2021  . Polymyalgia rheumatica ([place]) 02/12/2021  . Inflammatory arthritis 02/12/2021  . Left knee pain 02/12/2021  . Long term (current) use of systemic steroids 02/01/2021  . Nail disorder 01/24/2021  . Toe pain, right 01/24/2021  . Chondrocalcinosis 12/19/2020  . Primary osteoarthritis of both hands 12/19/2020  . Pain in joint of left shoulder 08/22/2020  . Pain in joint of right shoulder 08/22/2020  . Shoulder pain 07/31/2020  . Chronic pain 04/23/2020  . Supraclavicular fossa fullness 04/23/2020  . Finding of neck region 04/23/2020  . Low grade fever 12/07/2019  . OAB (overactive bladder) 12/07/2019  . Pain of right heel 12/07/2019  . Overactive bladder 12/07/2019  . Diarrhea 04/13/2019  . Abnormal gait 08/30/2018  . Asthenia 08/30/2018  . Conjunctivitis of left eye 04/13/2018  . Rash 04/13/2018  . Eruption at injection site 04/13/2018  . Acute bilateral deep vein thrombosis (DVT) of femoral veins ([place]) 01/31/2018  . Hypokalemia 01/31/2018  . Electrocardiogram abnormal 01/31/2018  . Increased creatine kinase level 01/31/2018  . Left leg pain 01/13/2018  . Left leg swelling 01/13/2018  . Hearing loss 01/13/2018  . Intractable pain 12/27/2017  . Arachnoiditis 12/27/2017  . Pain 12/27/2017  . Hyponatremia   . S/P lumbar laminectomy 12/23/2017  . History of lumbar laminectomy 12/23/2017  . Wheezing 12/10/2017  . Itching  11/22/2017  . Urinary frequency 11/22/2017  . Increased frequency of urination 11/22/2017  . Degeneration of lumbar intervertebral disc 11/05/2017  . Sciatica 09/04/2017  . Asthma exacerbation 08/31/2017  . Acute asthma 08/31/2017  . Flare of rheumatoid arthritis ([place]) 05/11/2017  . Articular gout 05/01/2017  . Vertigo 10/30/2016  . Allergic rhinitis 10/30/2016  . Incontinence of feces 07/31/2016  . Hemorrhoids 07/31/2016  . Hematochezia  07/31/2016  . Peripheral venous insufficiency 07/10/2016  . Diastolic dysfunction 03/50/0938  . Peripheral edema 07/10/2016  . Left ankle pain 06/02/2016  . Ankle pain 06/02/2016  . Neck pain 03/10/2016  . Low back pain 03/10/2016  . Right cervical radiculopathy 11/13/2015  . Bradycardia 03/15/2015  . Joint pain 12/18/2014  . Fever 11/27/2014  . Impaired glucose tolerance 07/18/2014  . Right lumbar radiculopathy 07/18/2014  . Lumbar radiculopathy 07/18/2014  . Encounter for well adult exam with abnormal findings 02/20/2014  . Malignant neoplasm of upper-outer quadrant of female breast ([place]) 11/08/2013  . Diverticular disease 07/09/2012  . Headache(784.0) 06/30/2012  . Anemia 06/29/2012  . Dyspnea 03/11/2012  . Cough 03/11/2012  . CKD (chronic kidney disease) stage 4, GFR 15-29 ml/min ([facility]) 05/18/2011  . Gastroesophageal reflux disease 07/05/2008  . Vitamin D deficiency 12/19/2007  . Morbid obesity ([place]) 12/16/2007  . Essential hypertension 12/16/2007  . DEGENERATIVE JOINT DISEASE 12/16/2007  . Osteoarthritis 12/16/2007    Past Surgical History:  Procedure Laterality Date  . ABDOMINAL HYSTERECTOMY    . BREAST LUMPECTOMY WITH NEEDLE LOCALIZATION AND AXILLARY SENTINEL LYMPH NODE BX Left 12/04/2013   Procedure: BREAST LUMPECTOMY WITH NEEDLE LOCALIZATION AND AXILLARY SENTINEL LYMPH NODE BX;  Surgeon: Shann Medal, MD;  Location: [facility];  Service: General;  Laterality: Left;  . CATARACT EXTRACTION  2009   rt  . COLONOSCOPY    . EYE SURGERY Bilateral    cataract surgery  . KNEE ARTHROSCOPY     both  . LUMBAR LAMINECTOMY/DECOMPRESSION MICRODISCECTOMY Left 12/23/2017   Procedure: Left Lumbar One-Two Laminectomy with microdiscectomy;  Surgeon: Eustace Moore, MD;  Location: [facility];  Service: Neurosurgery;  Laterality: Left;  Left L1-2 Laminectomy with microdiscectomy  . TONSILLECTOMY    . TOTAL KNEE ARTHROPLASTY  2002   rt  . TOTAL KNEE ARTHROPLASTY  2003    left  . VESICOVAGINAL FISTULA CLOSURE W/ TAH  1980     OB History   No obstetric history on file.     Family History  Problem Relation Age of Onset  . Colon cancer Mother        in her 69's  . Stomach cancer Mother   . Lung cancer Brother        was a smoker  . Heart attack Son 64  . Healthy Daughter     Social History   Tobacco Use  . Smoking status: Never  . Smokeless tobacco: Never  Vaping Use  . Vaping Use: Never used  Substance Use Topics  . Alcohol use: No    Alcohol/week: 0.0 standard drinks  . Drug use: No    Home Medications Prior to Admission medications   Medication Sig Start Date End Date Taking? Authorizing Provider  albuterol (VENTOLIN HFA) 108 (90 Base) MCG/ACT inhaler Inhale 1 puff into the lungs every 6 (six) hours as needed for wheezing or shortness of breath.   Yes [provider]  amLODipine (NORVASC) 5 MG tablet TAKE 1 TABLET BY MOUTH EVERY DAY Patient taking differently: Take 5 mg by mouth  every morning. 10/08/20  Yes Biagio Borg, MD  aspirin EC 81 MG tablet Take 81 mg by mouth every morning. Swallow whole.   Yes [provider]  Cholecalciferol (VITAMIN D3 PO) Take 1 tablet by mouth daily.   Yes [provider]  diclofenac Sodium (VOLTAREN) 1 % GEL APPLY 2 GRAMS TO AFFECTED AREA 4 TIMES A DAY Patient taking differently: Apply 1 application topically 4 (four) times daily as needed (pain). 05/08/21  Yes Biagio Borg, MD  Ensure (ENSURE) Take 1 Can by mouth 3 (three) times daily between meals. Patient taking differently: Take 237 mLs by mouth See admin instructions. Drink one can (237 mls) by mouth once or twice daily 05/23/19  Yes Biagio Borg, MD  fluticasone-salmeterol (ADVAIR DISKUS) 250-50 MCG/ACT AEPB Inhale 1 puff into the lungs in the morning and at bedtime. 05/08/21  Yes Biagio Borg, MD  furosemide (LASIX) 40 MG tablet Take 1 tablet (40 mg total) by mouth daily. Patient taking differently: Take 40 mg by mouth  every Monday, Wednesday, and Friday. 07/10/21  Yes Biagio Borg, MD  gabapentin (NEURONTIN) 300 MG capsule TAKE 1 CAPSULE BY MOUTH THREE TIMES A DAY Patient taking differently: Take 300 mg by mouth 3 (three) times daily. 05/20/21  Yes Biagio Borg, MD  losartan (COZAAR) 100 MG tablet Take 100 mg by mouth every morning. 02/05/21  Yes [provider]  Magnesium Gluconate 500 (27 Mg) MG TABS Take 500 mg by mouth 2 (two) times daily.   Yes [provider]  meclizine (ANTIVERT) 12.5 MG tablet TAKE 1 TABLET BY MOUTH THREE TIMES A DAY AS NEEDED FOR DIZZINESS Patient taking differently: Take 12.5 mg by mouth 3 (three) times daily as needed for dizziness. 03/03/21  Yes Biagio Borg, MD  montelukast (SINGULAIR) 10 MG tablet Take 10 mg by mouth at bedtime.   Yes [provider]  Multiple Vitamin (MULTIVITAMIN WITH MINERALS) TABS tablet Take 1 tablet by mouth daily. Centrum Silver   Yes [provider]  pantoprazole (PROTONIX) 40 MG tablet Take 40 mg by mouth daily.   Yes [provider]  potassium chloride (KLOR-CON) 10 MEQ tablet TAKE 1 TABLET BY MOUTH EVERY DAY WHEN TAKING FUROSEMIDE Patient taking differently: Take 10 mEq by mouth every Monday, Wednesday, and Friday. 06/07/21  Yes Biagio Borg, MD  predniSONE (DELTASONE) 1 MG tablet Take 1 tablet (1 mg total) by mouth daily with breakfast. Along with 30m to equal 662mby mouth daily. Patient taking differently: Take 1 mg by mouth daily with breakfast. Take with a 5 mg tablet for a total dose of 6 mg daily 07/01/21  Yes Deveshwar, ShAbel PrestoMD  predniSONE (DELTASONE) 5 MG tablet Take 1 tablet (5 mg total) by mouth daily with breakfast. Patient taking differently: Take 5 mg by mouth daily with breakfast. Take with a 1 mg tablet for a total dose of 6 mg daily 07/01/21  Yes Deveshwar, ShAbel PrestoMD  traMADol (ULTRAM-ER) 200 MG 24 hr tablet TAKE 1 TABLET BY MOUTH EVERY DAY Patient taking differently: Take 200 mg by mouth every  morning. 04/29/21  Yes JoBiagio BorgMD  triamcinolone cream (KENALOG) 0.1 % APPLY TO AFFECTED AREA TWICE A DAY Patient taking differently: Apply 1 application topically 2 (two) times daily as needed (rash/irritation). 06/29/21  Yes JoBiagio BorgMD  Incontinence Supply Disposable (DEPEND UNDERWEAR SM/MED) MISC Use as directed four times per day 05/23/19   JoBiagio BorgMD  Allergies    Hydrocodone, Lasix [furosemide], Tizanidine, and Iron  Review of Systems   Review of Systems  Unable to perform ROS: Mental status change  HENT: Negative.    Respiratory: Negative.    Cardiovascular: Negative.   Gastrointestinal: Negative.   Genitourinary: Negative.   Musculoskeletal: Negative.   Neurological:  Positive for weakness.  All other systems reviewed and are negative.  Physical Exam Updated Vital Signs BP (!) 178/115   Pulse 86   Temp 97.6 F (36.4 C) (Oral)   Resp 17   Ht 5' (1.524 m)   Wt 70.3 kg   SpO2 94%   BMI 30.27 kg/m   Physical Exam Vitals and nursing note reviewed.  Constitutional:      General: She is not in acute distress.    Appearance: She is well-developed. She is not ill-appearing, toxic-appearing or diaphoretic.  HENT:     Head: Normocephalic and atraumatic.     Nose: Nose normal.     Mouth/Throat:     Mouth: Mucous membranes are dry.  Eyes:     Pupils: Pupils are equal, round, and reactive to light.  Cardiovascular:     Rate and Rhythm: Normal rate.     Pulses:          Radial pulses are 2+ on the right side and 2+ on the left side.       Dorsalis pedis pulses are 1+ on the right side and 1+ on the left side.     Heart sounds: Normal heart sounds.  Pulmonary:     Effort: Pulmonary effort is normal. No respiratory distress.     Breath sounds: Normal breath sounds.  Abdominal:     General: Bowel sounds are normal. There is no distension.     Palpations: Abdomen is soft.  Musculoskeletal:        General: Normal range of motion.     Cervical back:  Normal range of motion.     Comments: Moves all 4 extremities. Difficulty with over head motion Bl shoulders chronic ar baseline per patient.  Skin:    General: Skin is warm and dry.     Capillary Refill: Capillary refill takes less than 2 seconds.  Neurological:     Comments: A/O x 3 however seems slow to answer questions Global weakness No facial droop Intact sensation  Psychiatric:        Mood and Affect: Mood normal.    ED Results / Procedures / Treatments   Labs (all labs ordered are listed, but only abnormal results are displayed) Labs Reviewed  CBC WITH DIFFERENTIAL/PLATELET - Abnormal; Notable for the following components:      Result Value   WBC 12.6 (*)    RBC 3.79 (*)    MCV 100.8 (*)    Neutro Abs 10.8 (*)    Abs Immature Granulocytes 0.11 (*)    All other components within normal limits  COMPREHENSIVE METABOLIC PANEL - Abnormal; Notable for the following components:   Glucose, Bld 143 (*)    BUN 28 (*)    Creatinine, Ser 1.37 (*)    AST 57 (*)    GFR, Estimated 38 (*)    All other components within normal limits  LACTIC ACID, PLASMA - Abnormal; Notable for the following components:   Lactic Acid, Venous 2.4 (*)    All other components within normal limits  CK - Abnormal; Notable for the following components:   Total CK 1,200 (*)    All other  components within normal limits  URINALYSIS, ROUTINE W REFLEX MICROSCOPIC - Abnormal; Notable for the following components:   Hgb urine dipstick MODERATE (*)    Ketones, ur 5 (*)    Bacteria, UA RARE (*)    All other components within normal limits  TROPONIN I (HIGH SENSITIVITY) - Abnormal; Notable for the following components:   Troponin I (High Sensitivity) 50 (*)    All other components within normal limits  TROPONIN I (HIGH SENSITIVITY) - Abnormal; Notable for the following components:   Troponin I (High Sensitivity) 54 (*)    All other components within normal limits  CULTURE, BLOOD (ROUTINE X 2)  RESP PANEL BY  RT-PCR (FLU A&B, COVID) ARPGX2  CULTURE, BLOOD (ROUTINE X 2)  URINE CULTURE  LACTIC ACID, PLASMA  PROTIME-INR  RAPID URINE DRUG SCREEN, HOSP PERFORMED  AMMONIA  CBC  COMPREHENSIVE METABOLIC PANEL  TSH  MAGNESIUM    EKG EKG Interpretation  Date/Time:  Sunday July 27 2021 17:49:26 EDT Ventricular Rate:  86 PR Interval:  147 QRS Duration: 86 QT Interval:  393 QTC Calculation: 471 R Axis:   -40 Text Interpretation: Sinus rhythm Left anterior fascicular block Abnormal R-wave progression, late transition Consider left ventricular hypertrophy Confirmed by Octaviano Glow [phone]) on 07/27/2021 6:46:54 PM  Radiology DG Chest 2 View  Result Date: 07/27/2021 CLINICAL DATA:  Fall. EXAM: CHEST - 2 VIEW COMPARISON:  Chest x-ray 06/18/2021. FINDINGS: The heart size and mediastinal contours are within normal limits. Both lungs are clear. The visualized skeletal structures are unremarkable. IMPRESSION: No active cardiopulmonary disease. Electronically Signed   By: Ronney Asters M.D.   On: 07/27/2021 17:54   DG Pelvis 1-2 Views  Result Date: 07/27/2021 CLINICAL DATA:  Fall. EXAM: PELVIS - 1-2 VIEW COMPARISON:  Right hip x-ray 06/27/2021. FINDINGS: There is no evidence of pelvic fracture or diastasis. No pelvic bone lesions are seen. There are degenerative changes of the lower lumbar spine and sacroiliac joints. Vascular calcifications are noted in the soft tissues. There is a large amount of stool in the rectum. IMPRESSION: Negative. Electronically Signed   By: Ronney Asters M.D.   On: 07/27/2021 17:55   CT HEAD WO CONTRAST (5MM)  Result Date: 07/27/2021 CLINICAL DATA:  Fall with head and neck injury. EXAM: CT HEAD WITHOUT CONTRAST CT CERVICAL SPINE WITHOUT CONTRAST TECHNIQUE: Multidetector CT imaging of the head and cervical spine was performed following the standard protocol without intravenous contrast. Multiplanar CT image reconstructions of the cervical spine were also generated. COMPARISON:   CT head and cervical spine dated 01/28/2018. FINDINGS: CT HEAD FINDINGS Brain: No evidence of acute infarction, hemorrhage, hydrocephalus, extra-axial collection or mass lesion/mass effect. Periventricular white matter hypoattenuation likely represents chronic small vessel ischemic disease. There is mild cerebral volume loss with associated ex vacuo dilatation. Vascular: There are vascular calcifications in the carotid siphons. Skull: Normal. Negative for fracture or focal lesion. Sinuses/Orbits: No acute finding. Other: None. CT CERVICAL SPINE FINDINGS Alignment: There is 4 mm anterolisthesis of C3 on C4, unchanged. There is mild dextrocurvature centered at C3-4 which is similar to prior exam and may be positional. Skull base and vertebrae: No acute fracture. No primary bone lesion or focal pathologic process. Soft tissues and spinal canal: No prevertebral fluid or swelling. No visible canal hematoma. Disc levels: Up to severe multilevel degenerative disc and joint disease. Upper chest: Negative. Other: None. IMPRESSION: 1. No acute intracranial process. 2. No acute osseous injury in the cervical spine. Electronically Signed   By:  Zerita Boers M.D.   On: 07/27/2021 19:02   CT Cervical Spine Wo Contrast  Result Date: 07/27/2021 CLINICAL DATA:  Fall with head and neck injury. EXAM: CT HEAD WITHOUT CONTRAST CT CERVICAL SPINE WITHOUT CONTRAST TECHNIQUE: Multidetector CT imaging of the head and cervical spine was performed following the standard protocol without intravenous contrast. Multiplanar CT image reconstructions of the cervical spine were also generated. COMPARISON:  CT head and cervical spine dated 01/28/2018. FINDINGS: CT HEAD FINDINGS Brain: No evidence of acute infarction, hemorrhage, hydrocephalus, extra-axial collection or mass lesion/mass effect. Periventricular white matter hypoattenuation likely represents chronic small vessel ischemic disease. There is mild cerebral volume loss with associated ex  vacuo dilatation. Vascular: There are vascular calcifications in the carotid siphons. Skull: Normal. Negative for fracture or focal lesion. Sinuses/Orbits: No acute finding. Other: None. CT CERVICAL SPINE FINDINGS Alignment: There is 4 mm anterolisthesis of C3 on C4, unchanged. There is mild dextrocurvature centered at C3-4 which is similar to prior exam and may be positional. Skull base and vertebrae: No acute fracture. No primary bone lesion or focal pathologic process. Soft tissues and spinal canal: No prevertebral fluid or swelling. No visible canal hematoma. Disc levels: Up to severe multilevel degenerative disc and joint disease. Upper chest: Negative. Other: None. IMPRESSION: 1. No acute intracranial process. 2. No acute osseous injury in the cervical spine. Electronically Signed   By: Zerita Boers M.D.   On: 07/27/2021 19:02    Procedures .Critical Care Performed by: Nettie Elm, PA-C Authorized by: Nettie Elm, PA-C   Critical care provider statement:    Critical care time (minutes):  31   Critical care was necessary to treat or prevent imminent or life-threatening deterioration of the following conditions:  Toxidrome, dehydration and CNS failure or compromise   Critical care was time spent personally by me on the following activities:  Discussions with consultants, evaluation of patient's response to treatment, examination of patient, ordering and performing treatments and interventions, ordering and review of laboratory studies, ordering and review of radiographic studies, pulse oximetry, re-evaluation of patient's condition, obtaining history from patient or surrogate and review of old charts   Medications Ordered in ED Medications  vancomycin (VANCOREADY) IVPB 1500 mg/300 mL (1,500 mg Intravenous New Bag/Given 07/27/21 2202)  enoxaparin (LOVENOX) injection 30 mg (has no administration in time range)  acetaminophen (TYLENOL) tablet 650 mg (has no administration in time  range)    Or  acetaminophen (TYLENOL) suppository 650 mg (has no administration in time range)  ondansetron (ZOFRAN) tablet 4 mg (has no administration in time range)    Or  ondansetron (ZOFRAN) injection 4 mg (has no administration in time range)  gabapentin (NEURONTIN) capsule 300 mg (has no administration in time range)  aspirin EC tablet 81 mg (has no administration in time range)  predniSONE (DELTASONE) tablet 6 mg (has no administration in time range)  mometasone-formoterol (DULERA) 200-5 MCG/ACT inhaler 2 puff (has no administration in time range)  amLODipine (NORVASC) tablet 5 mg (has no administration in time range)  albuterol (VENTOLIN HFA) 108 (90 Base) MCG/ACT inhaler 1 puff (has no administration in time range)  Ensure liquid 237 mL (has no administration in time range)  losartan (COZAAR) tablet 100 mg (has no administration in time range)  montelukast (SINGULAIR) tablet 10 mg (has no administration in time range)  pantoprazole (PROTONIX) EC tablet 40 mg (has no administration in time range)  multivitamin with minerals tablet 1 tablet (has no administration in time range)  sodium chloride 0.9 % bolus 500 mL (0 mLs Intravenous Stopped 07/27/21 2046)  sodium chloride 0.9 % bolus 500 mL (0 mLs Intravenous Stopped 07/27/21 2159)  sodium chloride 0.9 % bolus 1,000 mL (0 mLs Intravenous Stopped 07/27/21 2159)  cefTRIAXone (ROCEPHIN) 1 g in sodium chloride 0.9 % 100 mL IVPB (0 g Intravenous Stopped 07/27/21 2159)    ED Course  I have reviewed the triage vital signs and the nursing notes.  Pertinent labs & imaging results that were available during my care of the patient were reviewed by me and considered in my medical decision making (see chart for details).  85 year old here for evaluation of AMS.  Apparently found down, patient does not know what happened. Found on the floor. LKN 1600 yesterday. Upon fire arrival patient altered.  Had improved by the time EMS arrived however family  states patient is still not at her baseline.  Patient with nonfocal neuro exam here however does appear very globally weak.  Heart and lungs clear.  Abdomen soft, nontender.  Does have some trace pitting edema to feet.  Plan on broad work-up, labs, imaging and reassess.  Labs and imaging personally reviewed and interpreted:  CBC leukocytosis at 12.6 CMP glucose 143, creatinine 1.37 INR 1.0 Lactic 1.5 COVID/flu negative CK 1200 UDS neg UA negative for infection Trop 50>>54, denies chest pain, shortness of breath, low suspicion for ACS, PE CT head without acute abnormality CT cervical without acute abnormality DG chest without acute abnormality DG pelvis without acute abnormality  Reassessed.  Seems confused however does answer questions appropriately.  Appears patient likely had syncopal episode yesterday per family.  Will need to be admitted for further management.  She has no clear infectious source on exam.  Doubt meningitis.  Family does state patient has had some mild cognitive decline for the last year however has been able to live independently and was at her baseline yesterday  CONSULT with Dr. Alcario Drought with [facility] agrees to evaluate patient for admission.  Clinical Course as of 07/27/21 2348  Nancy Fetter Jul 27, 2021  2049 This is an 85 year old female presenting from home with confusion after being found on the ground today.  Her daughter reports that the patient lives independently, daughter visits frequently.  They had a normal conversation yesterday.  This morning her daughter came by to check on the patient and found her lying on the ground.  Patient cannot recall what happened.  She is not sure how long she was lying on the ground.  Her daughter was concerned the patient was mumbling and appeared confused.  EMS reported foul-smelling urine on the patient when they picked her up.  Here in the ED she has been afebrile, normal heart rate, no hypoxia.  Her labs did show initially an elevated  lactate which improved after fluids and is now normal.  She had a CK of 1200.  White blood cell count of 12.6.  Troponin is very mildly elevated but flat.  I personally interpreted her EKG which shows a sinus rhythm no acute ischemic findings, I doubt this is ACS or PE.  On exam the patient does appear somnolent, somewhat confused, mumbling, but can answer questions appropriately.  She is falling asleep repeatedly.  She has no focal tenderness, including abd tenderness.   [MT]  2051 We have ordered fluids, broad-spectrum IV antibiotics for sepsis and infection work-up, asked for in and out urine cath.  CT scan of the head and C-spine does not show any acute traumatic  injury or CVA.  DG pelvis and chest without obvious fx or infectious source in lungs. [MT]  2051 Supplemental hx provided by daughter at bedside.  Daughter reports she is planning to move in with the patient and is currently the process of doing so.  Patient has been living dependently but has had some cognitive decline for the past year or so. [MT]    Clinical Course User Index [MT] Trifan, Carola Rhine, MD   MDM Rules/Calculators/A&P                            Final Clinical Impression(s) / ED Diagnoses Final diagnoses:  Altered mental status, unspecified altered mental status type  Fall, initial encounter  Traumatic rhabdomyolysis, initial encounter [facility])  Elevated troponin    Rx / DC Orders ED Discharge Orders     None        Lamel Mccarley A, PA-C 07/27/21 2350    Wyvonnia Dusky, MD 07/28/21 2115

## 2021-07-28 ENCOUNTER — Observation Stay (HOSPITAL_COMMUNITY): Payer: Medicare HMO

## 2021-07-28 ENCOUNTER — Observation Stay (HOSPITAL_BASED_OUTPATIENT_CLINIC_OR_DEPARTMENT_OTHER): Payer: Medicare HMO

## 2021-07-28 DIAGNOSIS — R778 Other specified abnormalities of plasma proteins: Secondary | ICD-10-CM | POA: Diagnosis not present

## 2021-07-28 DIAGNOSIS — R4182 Altered mental status, unspecified: Secondary | ICD-10-CM | POA: Diagnosis not present

## 2021-07-28 DIAGNOSIS — R400 Somnolence: Secondary | ICD-10-CM | POA: Diagnosis not present

## 2021-07-28 DIAGNOSIS — G8929 Other chronic pain: Secondary | ICD-10-CM | POA: Diagnosis not present

## 2021-07-28 DIAGNOSIS — R55 Syncope and collapse: Secondary | ICD-10-CM | POA: Diagnosis not present

## 2021-07-28 DIAGNOSIS — I5032 Chronic diastolic (congestive) heart failure: Secondary | ICD-10-CM | POA: Diagnosis not present

## 2021-07-28 LAB — COMPREHENSIVE METABOLIC PANEL
ALT: 29 U/L (ref 0–44)
AST: 56 U/L — ABNORMAL HIGH (ref 15–41)
Albumin: 3.1 g/dL — ABNORMAL LOW (ref 3.5–5.0)
Alkaline Phosphatase: 63 U/L (ref 38–126)
Anion gap: 8 (ref 5–15)
BUN: 22 mg/dL (ref 8–23)
CO2: 28 mmol/L (ref 22–32)
Calcium: 9.1 mg/dL (ref 8.9–10.3)
Chloride: 107 mmol/L (ref 98–111)
Creatinine, Ser: 1.18 mg/dL — ABNORMAL HIGH (ref 0.44–1.00)
GFR, Estimated: 46 mL/min — ABNORMAL LOW (ref >60)
Glucose, Bld: 99 mg/dL (ref 70–99)
Potassium: 3.1 mmol/L — ABNORMAL LOW (ref 3.5–5.1)
Sodium: 143 mmol/L (ref 135–145)
Total Bilirubin: 0.8 mg/dL (ref 0.3–1.2)
Total Protein: 6.2 g/dL — ABNORMAL LOW (ref 6.5–8.1)

## 2021-07-28 LAB — URINE CULTURE: Culture: NO GROWTH

## 2021-07-28 LAB — CBC
HCT: 34.9 % — ABNORMAL LOW (ref 36.0–46.0)
Hemoglobin: 11.1 g/dL — ABNORMAL LOW (ref 12.0–15.0)
MCH: 32.3 pg (ref 26.0–34.0)
MCHC: 31.8 g/dL (ref 30.0–36.0)
MCV: 101.5 fL — ABNORMAL HIGH (ref 80.0–100.0)
Platelets: 234 10*3/uL (ref 150–400)
RBC: 3.44 MIL/uL — ABNORMAL LOW (ref 3.87–5.11)
RDW: 13.8 % (ref 11.5–15.5)
WBC: 12.8 10*3/uL — ABNORMAL HIGH (ref 4.0–10.5)
nRBC: 0 % (ref 0.0–0.2)

## 2021-07-28 LAB — ECHOCARDIOGRAM COMPLETE
Area-P 1/2: 3.74 cm2
Height: 60 in
S' Lateral: 1.8 cm
Weight: 2480 oz

## 2021-07-28 LAB — CK
Total CK: 1169 U/L — ABNORMAL HIGH (ref 38–234)
Total CK: 1007 U/L — ABNORMAL HIGH (ref 38–234)

## 2021-07-28 LAB — VITAMIN B12: Vitamin B-12: 5776 pg/mL — ABNORMAL HIGH (ref 180–914)

## 2021-07-28 LAB — AMMONIA: Ammonia: 38 umol/L — ABNORMAL HIGH (ref 9–35)

## 2021-07-28 LAB — MAGNESIUM: Magnesium: 1.3 mg/dL — ABNORMAL LOW (ref 1.7–2.4)

## 2021-07-28 LAB — FOLATE: Folate: 50 ng/mL (ref >5.9)

## 2021-07-28 LAB — TSH: TSH: 1.404 u[IU]/mL (ref 0.350–4.500)

## 2021-07-28 MED ORDER — MAGNESIUM SULFATE 50 % IJ SOLN: 3.0000 g | Freq: Once | INTRAVENOUS | Status: DC

## 2021-07-28 MED ORDER — POTASSIUM CHLORIDE CRYS ER 20 MEQ PO TBCR
40.0000 meq | EXTENDED_RELEASE_TABLET | Freq: Once | ORAL | Status: AC
  Administered 2021-07-28: 40 meq via ORAL
  Filled 2021-07-28: qty 2

## 2021-07-28 MED ORDER — PERFLUTREN LIPID MICROSPHERE
1.0000 mL | INTRAVENOUS | Status: AC | PRN
  Administered 2021-07-28: 2 mL via INTRAVENOUS
  Filled 2021-07-28: qty 10

## 2021-07-28 MED ORDER — MAGNESIUM SULFATE 2 GM/50ML IV SOLN
2.0000 g | Freq: Once | INTRAVENOUS | Status: AC
  Administered 2021-07-28: 2 g via INTRAVENOUS
  Filled 2021-07-28: qty 50

## 2021-07-28 NOTE — Progress Notes (Addendum)
[facility]                                                                              Patient Demographics  [name], is a 85 y.o. female, DOB - [birth year], [place] date - 07/27/2021   Admitting Physician Etta Quill, DO  Outpatient Primary MD for the patient is Biagio Borg, MD  Outpatient specialists:   LOS - 0  days   Medical records reviewed and are as summarized below:    No chief complaint on file.      Brief summary   Patient is 85 year old female with history of diastolic CHF, hypertension, CKD stage 3-4, chronic steroid use presented with generalized weakness, fatigue for few weeks.  Patient also had waxing and waning mental status per family.  Patient presented to ED with fall and altered mental status.  At baseline lives alone and ambulates with a walker.  Daughter spoke with patient a day before the admission.  On the day of admission she called and patient was not answering the phone and she went to the house.  Patient was found on the floor, down for unknown period of time, confused, did not realize she had fallen on the floor. Patient mental status had improved somewhat and was alert in ED with slow mentation. Per family, had malodorous urine In ED, BUN 28, creatinine 1.37, sodium 142, magnesium 1.3, ammonia level 38.  CK elevated 1200, troponin 50-> 54, lactic acid 2.4, leukocytosis 12.6  Chest x-ray negative for any pneumonia CT head showed no acute intracranial process.  CT C-spine showed no fracture Pelvic x-ray negative for any fracture EKG showed rate 86, normal sinus rhythm, no acute ST-T wave change suggestive of ischemia.  Assessment & Plan    Principal Problem: Acute metabolic encephalopathy, acute rhabdomyolysis, fall -Unclear etiology, possibly due to polypharmacy, Neurontin and Ultram in the setting of CKD.  No infectious cause, UA showed ketones however no UTI -Has rhabdomyolysis due to fall -CT head  and MRI of the brain negative for any stroke.   -Unclear if she has progression of underlying dementia -TSH 1.4, follow B1, folate, B12.  Follow urine culture and sensitivities, blood cultures. -PT OT evaluation  Active Problems: Mildly elevated troponin -EKG negative for any acute ST-T wave changes, and currently no chest pain or shortness of breath -Follow 2D echo  History of diastolic CHF -Currently no acute decompensation, holding Lasix today -Follow 2D echo  CKD stage IIIb -Holding Lasix today, patient has had creatinine improvement due to reducing the Lasix to 3 times weekly  History of chondrocalcinosis, osteoarthritis of hands and shoulders -Patient follows rheumatology, Dr. Judithann Sheen -- Has been on chronic steroids for many years.  Currently on prednisone 6 mg daily, has not been able to tolerate prednisone taper outpatient.   -will continue current dose, follow-up patient with rheumatology.  History of asthma -Currently stable, no wheezing  Obesity Estimated body mass index is 30.27 kg/m as calculated from the following:   Height as of this encounter: 5' (1.524 m).   Weight as of this encounter: 70.3 kg.  Code Status: Full CODE STATUS  DVT Prophylaxis:  enoxaparin (LOVENOX) injection 30 mg Start: 07/28/21 1000   Level of Care: Level of care: Telemetry Family Communication: no family at bedside    Disposition Plan:     Status is: Observation  The patient remains OBS appropriate and will d/c before 2 midnights.  Dispo: The patient is from: Home              Anticipated d/c is to: Home              Patient currently is not medically stable to d/c.   Difficult to place patient No      Time Spent in minutes   58mins  Procedures:  None  Consultants:   None  Antimicrobials:   Anti-infectives (From admission, onward)    Start     Dose/Rate Route Frequency Ordered Stop   07/27/21 2200  vancomycin (VANCOREADY) IVPB 1500 mg/300 mL        1,500 mg 150  mL/hr over 120 Minutes Intravenous  Once 07/27/21 2110 07/28/21 0017   07/27/21 2100  cefTRIAXone (ROCEPHIN) 1 g in sodium chloride 0.9 % 100 mL IVPB        1 g 200 mL/hr over 30 Minutes Intravenous  Once 07/27/21 2049 07/27/21 2159          Medications  Scheduled Meds:  amLODipine  5 mg Oral q morning   aspirin EC  81 mg Oral q morning   enoxaparin (LOVENOX) injection  30 mg Subcutaneous Q24H   feeding supplement  237 mL Oral BID BM   gabapentin  300 mg Oral TID   losartan  100 mg Oral q morning   mometasone-formoterol  2 puff Inhalation BID   montelukast  10 mg Oral QHS   multivitamin with minerals  1 tablet Oral Daily   pantoprazole  40 mg Oral Daily   predniSONE  6 mg Oral Q breakfast   Continuous Infusions: PRN Meds:.acetaminophen **OR** acetaminophen, albuterol, ondansetron **OR** ondansetron (ZOFRAN) IV      Subjective:   [name] was seen and examined today.  Much more alert and oriented.  Has resting tremor, states she lives alone and ambulates with a walker.  No acute chest pain, fevers or chills, shortness of breath.     Objective:   Vitals:   07/28/21 0600 07/28/21 0700 07/28/21 0900 07/28/21 1022  BP: (!) 155/94 (!) 169/98 (!) 165/93 (!) 149/101  Pulse: 95 99 100   Resp: 18 17 19    Temp:      TempSrc:      SpO2: 97% 98% 100%   Weight:      Height:        Intake/Output Summary (Last 24 hours) at 07/28/2021 1129 Last data filed at 07/27/2021 2147 Gross per 24 hour  Intake --  Output 1700 ml  Net -1700 ml     Wt Readings from Last 3 Encounters:  07/27/21 70.3 kg  07/22/21 70.3 kg  07/01/21 70.3 kg     Exam General: Alert and oriented x 3, NAD Cardiovascular: S1 S2 auscultated, no murmurs, RRR Respiratory: Clear to auscultation bilaterally, no wheezing Gastrointestinal: Soft, nontender, nondistended, + bowel sounds Ext: no pedal edema bilaterally Neuro: no new FND's Psych: normal affect   Data Reviewed:  I have personally  reviewed following labs and imaging studies  Micro Results Recent Results (from the past 240 hour(s))  Blood culture (routine x 2)     Status: None (Preliminary result)   Collection Time: 07/27/21  4:40 PM   Specimen: BLOOD  Result Value Ref Range Status   Specimen Description   Final    BLOOD SITE NOT SPECIFIED Performed at [facility] [address], [place], [place] [address]    Special Requests   Final    BOTTLES DRAWN AEROBIC AND ANAEROBIC Blood Culture adequate volume Performed at [facility] [address], [place], [place] [address]    Culture   Final    NO GROWTH < 12 HOURS Performed at [facility] [address], [place], [place] [address]    Report Status PENDING  Incomplete  Blood culture (routine x 2)     Status: None (Preliminary result)   Collection Time: 07/27/21  4:40 PM   Specimen: BLOOD RIGHT FOREARM  Result Value Ref Range Status   Specimen Description   Final    BLOOD RIGHT FOREARM Performed at [facility], [place] [address] [place], [place] [address]    Special Requests   Final    BOTTLES DRAWN AEROBIC AND ANAEROBIC Blood Culture results may not be optimal due to an excessive volume of blood received in culture bottles Performed at [facility] [address] [place], [place] [address]    Culture   Final    NO GROWTH < 12 HOURS Performed at [facility] [address], [place], [place] [address]    Report Status PENDING  Incomplete  Resp Panel by RT-PCR (Flu A&B, Covid) Nasopharyngeal Swab     Status: None   Collection Time: 07/27/21  4:54 PM   Specimen: Nasopharyngeal Swab; Nasopharyngeal(NP) swabs in vial transport medium  Result Value Ref Range Status   SARS Coronavirus 2 by RT PCR NEGATIVE NEGATIVE Final    Comment: (NOTE) SARS-CoV-2 target nucleic acids are NOT DETECTED.  The SARS-CoV-2 RNA is generally detectable in upper respiratory specimens during the acute  phase of infection. The lowest concentration of SARS-CoV-2 viral copies this assay can detect is 138 copies/mL. A negative result does not preclude SARS-Cov-2 infection and should not be used as the sole basis for treatment or other patient management decisions. A negative result may occur with  improper specimen collection/handling, submission of specimen other than nasopharyngeal swab, presence of viral mutation(s) within the areas targeted by this assay, and inadequate number of viral copies(<138 copies/mL). A negative result must be combined with clinical observations, patient history, and epidemiological information. The expected result is Negative.  Fact Sheet for Patients:  EntrepreneurPulse.com.au  Fact Sheet for Healthcare Providers:  IncredibleEmployment.be  This test is no t yet approved or cleared by the [place] FDA and  has been authorized for detection and/or diagnosis of SARS-CoV-2 by FDA under an Emergency Use Authorization (EUA). This EUA will remain  in effect (meaning this test can be used) for the duration of the COVID-19 declaration under Section 564(b)(1) of the Act, 21 U.S.C.section 360bbb-3(b)(1), unless the authorization is terminated  or revoked sooner.       Influenza A by PCR NEGATIVE NEGATIVE Final   Influenza B by PCR NEGATIVE NEGATIVE Final    Comment: (NOTE) The Xpert Xpress SARS-CoV-2/FLU/RSV plus assay is intended as an aid in the diagnosis of influenza from Nasopharyngeal swab specimens and should not be used as a sole basis for treatment. Nasal washings and aspirates are unacceptable for Xpert Xpress SARS-CoV-2/FLU/RSV testing.  Fact Sheet for Patients: EntrepreneurPulse.com.au  Fact Sheet for Healthcare Providers: IncredibleEmployment.be  This test is not yet  approved or cleared by the [place] and has been authorized for detection and/or diagnosis of  SARS-CoV-2 by FDA under an Emergency Use Authorization (EUA). This EUA will remain in effect (meaning this test can be used) for the duration of the COVID-19 declaration under Section 564(b)(1) of the Act, 21 U.S.C. section 360bbb-3(b)(1), unless the authorization is terminated or revoked.  Performed at [facility], [place] [address], [place], [place] [address]     Radiology Reports DG Chest 2 View  Result Date: 07/27/2021 CLINICAL DATA:  Fall. EXAM: CHEST - 2 VIEW COMPARISON:  Chest x-ray 06/18/2021. FINDINGS: The heart size and mediastinal contours are within normal limits. Both lungs are clear. The visualized skeletal structures are unremarkable. IMPRESSION: No active cardiopulmonary disease. Electronically Signed   By: Ronney Asters M.D.   On: 07/27/2021 17:54   DG Pelvis 1-2 Views  Result Date: 07/27/2021 CLINICAL DATA:  Fall. EXAM: PELVIS - 1-2 VIEW COMPARISON:  Right hip x-ray 06/27/2021. FINDINGS: There is no evidence of pelvic fracture or diastasis. No pelvic bone lesions are seen. There are degenerative changes of the lower lumbar spine and sacroiliac joints. Vascular calcifications are noted in the soft tissues. There is a large amount of stool in the rectum. IMPRESSION: Negative. Electronically Signed   By: Ronney Asters M.D.   On: 07/27/2021 17:55   CT HEAD WO CONTRAST (5MM)  Result Date: 07/27/2021 CLINICAL DATA:  Fall with head and neck injury. EXAM: CT HEAD WITHOUT CONTRAST CT CERVICAL SPINE WITHOUT CONTRAST TECHNIQUE: Multidetector CT imaging of the head and cervical spine was performed following the standard protocol without intravenous contrast. Multiplanar CT image reconstructions of the cervical spine were also generated. COMPARISON:  CT head and cervical spine dated 01/28/2018. FINDINGS: CT HEAD FINDINGS Brain: No evidence of acute infarction, hemorrhage, hydrocephalus, extra-axial collection or mass lesion/mass effect. Periventricular white matter  hypoattenuation likely represents chronic small vessel ischemic disease. There is mild cerebral volume loss with associated ex vacuo dilatation. Vascular: There are vascular calcifications in the carotid siphons. Skull: Normal. Negative for fracture or focal lesion. Sinuses/Orbits: No acute finding. Other: None. CT CERVICAL SPINE FINDINGS Alignment: There is 4 mm anterolisthesis of C3 on C4, unchanged. There is mild dextrocurvature centered at C3-4 which is similar to prior exam and may be positional. Skull base and vertebrae: No acute fracture. No primary bone lesion or focal pathologic process. Soft tissues and spinal canal: No prevertebral fluid or swelling. No visible canal hematoma. Disc levels: Up to severe multilevel degenerative disc and joint disease. Upper chest: Negative. Other: None. IMPRESSION: 1. No acute intracranial process. 2. No acute osseous injury in the cervical spine. Electronically Signed   By: Zerita Boers M.D.   On: 07/27/2021 19:02   CT Cervical Spine Wo Contrast  Result Date: 07/27/2021 CLINICAL DATA:  Fall with head and neck injury. EXAM: CT HEAD WITHOUT CONTRAST CT CERVICAL SPINE WITHOUT CONTRAST TECHNIQUE: Multidetector CT imaging of the head and cervical spine was performed following the standard protocol without intravenous contrast. Multiplanar CT image reconstructions of the cervical spine were also generated. COMPARISON:  CT head and cervical spine dated 01/28/2018. FINDINGS: CT HEAD FINDINGS Brain: No evidence of acute infarction, hemorrhage, hydrocephalus, extra-axial collection or mass lesion/mass effect. Periventricular white matter hypoattenuation likely represents chronic small vessel ischemic disease. There is mild cerebral volume loss with associated ex vacuo dilatation. Vascular: There are vascular calcifications in the carotid siphons. Skull: Normal. Negative for fracture or focal lesion. Sinuses/Orbits: No acute  finding. Other: None. CT CERVICAL SPINE FINDINGS  Alignment: There is 4 mm anterolisthesis of C3 on C4, unchanged. There is mild dextrocurvature centered at C3-4 which is similar to prior exam and may be positional. Skull base and vertebrae: No acute fracture. No primary bone lesion or focal pathologic process. Soft tissues and spinal canal: No prevertebral fluid or swelling. No visible canal hematoma. Disc levels: Up to severe multilevel degenerative disc and joint disease. Upper chest: Negative. Other: None. IMPRESSION: 1. No acute intracranial process. 2. No acute osseous injury in the cervical spine. Electronically Signed   By: Zerita Boers M.D.   On: 07/27/2021 19:02   MR BRAIN WO CONTRAST  Result Date: 07/28/2021 CLINICAL DATA:  Mental status changes of unknown cause. EXAM: MRI HEAD WITHOUT CONTRAST TECHNIQUE: Multiplanar, multiecho pulse sequences of the brain and surrounding structures were obtained without intravenous contrast. COMPARISON:  Head CT yesterday. FINDINGS: Brain: Diffusion imaging does not show any acute or subacute infarction. No focal abnormality affects the brainstem or cerebellum. Cerebral hemispheres show mild age related volume loss with mild to moderate chronic small-vessel ischemic change of the white matter. No cortical or large vessel territory infarction. No mass lesion, hemorrhage, hydrocephalus or extra-axial collection. Vascular: Major vessels at the base of the brain show flow. Skull and upper cervical spine: Negative Sinuses/Orbits: Clear/normal Other: None IMPRESSION: No acute or reversible finding. Mild age related volume loss. Mild to moderate chronic small-vessel ischemic changes of the cerebral hemispheric white matter. Electronically Signed   By: Nelson Chimes M.D.   On: 07/28/2021 08:03    Lab Data:  CBC: Recent Labs  Lab 07/27/21 1640 07/28/21 0426  WBC 12.6* 12.8*  NEUTROABS 10.8*  --   HGB 12.0 11.1*  HCT 38.2 34.9*  MCV 100.8* 101.5*  PLT 273 782   Basic Metabolic Panel: Recent Labs  Lab  07/27/21 1640 07/28/21 0005 07/28/21 0426  NA 142  --  143  K 3.7  --  3.1*  CL 102  --  107  CO2 28  --  28  GLUCOSE 143*  --  99  BUN 28*  --  22  CREATININE 1.37*  --  1.18*  CALCIUM 9.9  --  9.1  MG  --  1.3*  --    GFR: Estimated Creatinine Clearance: 31.6 mL/min (A) (by C-G formula based on SCr of 1.18 mg/dL (H)). Liver Function Tests: Recent Labs  Lab 07/27/21 1640 07/28/21 0426  AST 57* 56*  ALT 34 29  ALKPHOS 73 63  BILITOT 1.0 0.8  PROT 7.3 6.2*  ALBUMIN 3.8 3.1*   No results for input(s): LIPASE, AMYLASE in the last 168 hours. Recent Labs  Lab 07/28/21 0005  AMMONIA 38*   Coagulation Profile: Recent Labs  Lab 07/27/21 1640  INR 1.0   Cardiac Enzymes: Recent Labs  Lab 07/27/21 1640 07/28/21 0426  CKTOTAL 1,200* 1,169*   BNP (last 3 results) Recent Labs    06/18/21 1648  PROBNP 56.0   HbA1C: No results for input(s): HGBA1C in the last 72 hours. CBG: No results for input(s): GLUCAP in the last 168 hours. Lipid Profile: No results for input(s): CHOL, HDL, LDLCALC, TRIG, CHOLHDL, LDLDIRECT in the last 72 hours. Thyroid Function Tests: Recent Labs    07/28/21 0005  TSH 1.404   Anemia Panel: No results for input(s): VITAMINB12, FOLATE, FERRITIN, TIBC, IRON, RETICCTPCT in the last 72 hours. Urine analysis:    Component Value Date/Time   COLORURINE YELLOW 07/27/2021 2053  APPEARANCEUR CLEAR 07/27/2021 2053   LABSPEC 1.009 07/27/2021 2053   PHURINE 6.0 07/27/2021 2053   GLUCOSEU NEGATIVE 07/27/2021 2053   GLUCOSEU NEGATIVE 08/01/2020 1620   HGBUR MODERATE (A) 07/27/2021 2053   BILIRUBINUR NEGATIVE 07/27/2021 2053   KETONESUR 5 (A) 07/27/2021 2053   PROTEINUR NEGATIVE 07/27/2021 2053   UROBILINOGEN 0.2 08/01/2020 1620   NITRITE NEGATIVE 07/27/2021 2053   LEUKOCYTESUR NEGATIVE 07/27/2021 2053     Keeley Sussman M.D. [facility] 07/28/2021, 11:29 AM  Available via Epic secure chat 7am-7pm After 7 pm, please refer to night  coverage provider listed on amion.

## 2021-07-28 NOTE — ED Notes (Signed)
Patient is asleep.  

## 2021-07-28 NOTE — Telephone Encounter (Signed)
Next Visit: 11/04/2021  Last Visit: 07/01/2021  Last Fill: 07/01/2021 (30 day supply)  Dx: Chondrocalcinosis  Current  Dose per office note on 07/01/2021: prednisone 7 mg p.o. daily  Okay to refill Prednisone?

## 2021-07-28 NOTE — ED Notes (Signed)
Labs collected. Patient is asleep.

## 2021-07-28 NOTE — ED Notes (Signed)
Patient is on room air. Patient is currently wear bilateral hearing aids. Patient uses the Purewick to void.

## 2021-07-29 DIAGNOSIS — M810 Age-related osteoporosis without current pathological fracture: Secondary | ICD-10-CM | POA: Diagnosis not present

## 2021-07-29 DIAGNOSIS — M19042 Primary osteoarthritis, left hand: Secondary | ICD-10-CM | POA: Diagnosis not present

## 2021-07-29 DIAGNOSIS — G928 Other toxic encephalopathy: Secondary | ICD-10-CM | POA: Diagnosis not present

## 2021-07-29 DIAGNOSIS — T40425A Adverse effect of tramadol, initial encounter: Secondary | ICD-10-CM | POA: Diagnosis present

## 2021-07-29 DIAGNOSIS — Z0389 Encounter for observation for other suspected diseases and conditions ruled out: Secondary | ICD-10-CM | POA: Diagnosis not present

## 2021-07-29 DIAGNOSIS — R401 Stupor: Secondary | ICD-10-CM | POA: Diagnosis not present

## 2021-07-29 DIAGNOSIS — M19012 Primary osteoarthritis, left shoulder: Secondary | ICD-10-CM | POA: Diagnosis not present

## 2021-07-29 DIAGNOSIS — Z6830 Body mass index (BMI) 30.0-30.9, adult: Secondary | ICD-10-CM | POA: Diagnosis not present

## 2021-07-29 DIAGNOSIS — R400 Somnolence: Secondary | ICD-10-CM | POA: Diagnosis not present

## 2021-07-29 DIAGNOSIS — N184 Chronic kidney disease, stage 4 (severe): Secondary | ICD-10-CM | POA: Diagnosis not present

## 2021-07-29 DIAGNOSIS — G9341 Metabolic encephalopathy: Secondary | ICD-10-CM | POA: Diagnosis not present

## 2021-07-29 DIAGNOSIS — B349 Viral infection, unspecified: Secondary | ICD-10-CM | POA: Diagnosis not present

## 2021-07-29 DIAGNOSIS — I130 Hypertensive heart and chronic kidney disease with heart failure and stage 1 through stage 4 chronic kidney disease, or unspecified chronic kidney disease: Secondary | ICD-10-CM | POA: Diagnosis not present

## 2021-07-29 DIAGNOSIS — D849 Immunodeficiency, unspecified: Secondary | ICD-10-CM | POA: Diagnosis not present

## 2021-07-29 DIAGNOSIS — A419 Sepsis, unspecified organism: Secondary | ICD-10-CM | POA: Diagnosis not present

## 2021-07-29 DIAGNOSIS — I5032 Chronic diastolic (congestive) heart failure: Secondary | ICD-10-CM | POA: Diagnosis not present

## 2021-07-29 DIAGNOSIS — R0602 Shortness of breath: Secondary | ICD-10-CM | POA: Diagnosis not present

## 2021-07-29 DIAGNOSIS — R69 Illness, unspecified: Secondary | ICD-10-CM | POA: Diagnosis not present

## 2021-07-29 DIAGNOSIS — E669 Obesity, unspecified: Secondary | ICD-10-CM | POA: Diagnosis not present

## 2021-07-29 DIAGNOSIS — J9811 Atelectasis: Secondary | ICD-10-CM | POA: Diagnosis not present

## 2021-07-29 DIAGNOSIS — M064 Inflammatory polyarthropathy: Secondary | ICD-10-CM | POA: Diagnosis not present

## 2021-07-29 DIAGNOSIS — G8929 Other chronic pain: Secondary | ICD-10-CM | POA: Diagnosis not present

## 2021-07-29 DIAGNOSIS — R4182 Altered mental status, unspecified: Secondary | ICD-10-CM | POA: Diagnosis present

## 2021-07-29 DIAGNOSIS — M19041 Primary osteoarthritis, right hand: Secondary | ICD-10-CM | POA: Diagnosis present

## 2021-07-29 DIAGNOSIS — E876 Hypokalemia: Secondary | ICD-10-CM | POA: Diagnosis not present

## 2021-07-29 DIAGNOSIS — M19011 Primary osteoarthritis, right shoulder: Secondary | ICD-10-CM | POA: Diagnosis not present

## 2021-07-29 DIAGNOSIS — I959 Hypotension, unspecified: Secondary | ICD-10-CM | POA: Diagnosis not present

## 2021-07-29 DIAGNOSIS — T796XXA Traumatic ischemia of muscle, initial encounter: Secondary | ICD-10-CM | POA: Diagnosis not present

## 2021-07-29 DIAGNOSIS — Q666 Other congenital valgus deformities of feet: Secondary | ICD-10-CM | POA: Diagnosis not present

## 2021-07-29 DIAGNOSIS — G9340 Encephalopathy, unspecified: Secondary | ICD-10-CM | POA: Diagnosis not present

## 2021-07-29 DIAGNOSIS — D649 Anemia, unspecified: Secondary | ICD-10-CM | POA: Diagnosis not present

## 2021-07-29 DIAGNOSIS — M2142 Flat foot [pes planus] (acquired), left foot: Secondary | ICD-10-CM | POA: Diagnosis not present

## 2021-07-29 DIAGNOSIS — W19XXXA Unspecified fall, initial encounter: Secondary | ICD-10-CM | POA: Diagnosis not present

## 2021-07-29 DIAGNOSIS — Z20822 Contact with and (suspected) exposure to covid-19: Secondary | ICD-10-CM | POA: Diagnosis not present

## 2021-07-29 DIAGNOSIS — M19072 Primary osteoarthritis, left ankle and foot: Secondary | ICD-10-CM | POA: Diagnosis not present

## 2021-07-29 DIAGNOSIS — T426X5A Adverse effect of other antiepileptic and sedative-hypnotic drugs, initial encounter: Secondary | ICD-10-CM | POA: Diagnosis not present

## 2021-07-29 DIAGNOSIS — J45901 Unspecified asthma with (acute) exacerbation: Secondary | ICD-10-CM | POA: Diagnosis not present

## 2021-07-29 DIAGNOSIS — R6520 Severe sepsis without septic shock: Secondary | ICD-10-CM | POA: Diagnosis not present

## 2021-07-29 DIAGNOSIS — I10 Essential (primary) hypertension: Secondary | ICD-10-CM | POA: Diagnosis not present

## 2021-07-29 LAB — CRYPTOCOCCAL ANTIGEN: Crypto Ag: NEGATIVE

## 2021-07-29 LAB — RESPIRATORY PANEL BY PCR

## 2021-07-29 LAB — COMPREHENSIVE METABOLIC PANEL
ALT: 27 U/L (ref 0–44)
AST: 41 U/L (ref 15–41)
Albumin: 2.7 g/dL — ABNORMAL LOW (ref 3.5–5.0)
Alkaline Phosphatase: 52 U/L (ref 38–126)
Anion gap: 7 (ref 5–15)
BUN: 28 mg/dL — ABNORMAL HIGH (ref 8–23)
CO2: 25 mmol/L (ref 22–32)
Calcium: 9 mg/dL (ref 8.9–10.3)
Chloride: 111 mmol/L (ref 98–111)
Creatinine, Ser: 1.61 mg/dL — ABNORMAL HIGH (ref 0.44–1.00)
GFR, Estimated: 32 mL/min — ABNORMAL LOW (ref >60)
Glucose, Bld: 137 mg/dL — ABNORMAL HIGH (ref 70–99)
Potassium: 3.4 mmol/L — ABNORMAL LOW (ref 3.5–5.1)
Sodium: 143 mmol/L (ref 135–145)
Total Bilirubin: 0.8 mg/dL (ref 0.3–1.2)
Total Protein: 5.6 g/dL — ABNORMAL LOW (ref 6.5–8.1)

## 2021-07-29 LAB — BLOOD GAS, ARTERIAL
Acid-Base Excess: 1.8 mmol/L (ref 0.0–2.0)
Bicarbonate: 24.7 mmol/L (ref 20.0–28.0)
Drawn by: 25788
FIO2: 21
O2 Saturation: 93.3 %
Patient temperature: 99.8
pCO2 arterial: 35 mmHg (ref 32.0–48.0)
pH, Arterial: 7.466 — ABNORMAL HIGH (ref 7.350–7.450)
pO2, Arterial: 69.4 mmHg — ABNORMAL LOW (ref 83.0–108.0)

## 2021-07-29 LAB — PROCALCITONIN: Procalcitonin: <0.1 ng/mL

## 2021-07-29 LAB — CBC
HCT: 28.8 % — ABNORMAL LOW (ref 36.0–46.0)
Hemoglobin: 9.3 g/dL — ABNORMAL LOW (ref 12.0–15.0)
MCH: 32.1 pg (ref 26.0–34.0)
MCHC: 32.3 g/dL (ref 30.0–36.0)
MCV: 99.3 fL (ref 80.0–100.0)
Platelets: 188 10*3/uL (ref 150–400)
RBC: 2.9 MIL/uL — ABNORMAL LOW (ref 3.87–5.11)
RDW: 13.9 % (ref 11.5–15.5)
WBC: 16.6 10*3/uL — ABNORMAL HIGH (ref 4.0–10.5)
nRBC: 0 % (ref 0.0–0.2)

## 2021-07-29 LAB — SEDIMENTATION RATE: Sed Rate: 14 mm/hr (ref 0–22)

## 2021-07-29 LAB — C-REACTIVE PROTEIN: CRP: 10.1 mg/dL — ABNORMAL HIGH (ref <1.0)

## 2021-07-29 LAB — GLUCOSE, CAPILLARY: Glucose-Capillary: 137 mg/dL — ABNORMAL HIGH (ref 70–99)

## 2021-07-29 LAB — AMMONIA: Ammonia: 37 umol/L — ABNORMAL HIGH (ref 9–35)

## 2021-07-29 LAB — CORTISOL: Cortisol, Plasma: 9.4 ug/dL

## 2021-07-29 MED ORDER — SODIUM CHLORIDE 0.9 % IV SOLN
2.0000 g | Freq: Three times a day (TID) | INTRAVENOUS | Status: DC
  Administered 2021-07-29 – 2021-07-31 (×6): 2 g via INTRAVENOUS
  Filled 2021-07-29: qty 2
  Filled 2021-07-29 (×4): qty 2000
  Filled 2021-07-29 (×2): qty 2

## 2021-07-29 MED ORDER — SODIUM CHLORIDE 0.9 % IV BOLUS
500.0000 mL | Freq: Once | INTRAVENOUS | Status: AC
  Administered 2021-07-29: 500 mL via INTRAVENOUS

## 2021-07-29 MED ORDER — HYDROCORTISONE SOD SUC (PF) 100 MG IJ SOLR
50.0000 mg | Freq: Two times a day (BID) | INTRAMUSCULAR | Status: AC
  Administered 2021-07-29 (×2): 50 mg via INTRAVENOUS
  Filled 2021-07-29 (×2): qty 2

## 2021-07-29 MED ORDER — LIP MEDEX EX OINT
TOPICAL_OINTMENT | CUTANEOUS | Status: AC
  Filled 2021-07-29: qty 7

## 2021-07-29 MED ORDER — VANCOMYCIN HCL 1500 MG/300ML IV SOLN
1500.0000 mg | Freq: Once | INTRAVENOUS | Status: AC
  Administered 2021-07-29: 1500 mg via INTRAVENOUS
  Filled 2021-07-29: qty 300

## 2021-07-29 MED ORDER — VANCOMYCIN HCL 750 MG/150ML IV SOLN
750.0000 mg | INTRAVENOUS | Status: DC
  Administered 2021-07-30: 750 mg via INTRAVENOUS
  Filled 2021-07-29 (×2): qty 150

## 2021-07-29 MED ORDER — POTASSIUM CHLORIDE CRYS ER 20 MEQ PO TBCR
40.0000 meq | EXTENDED_RELEASE_TABLET | Freq: Once | ORAL | Status: AC
  Administered 2021-07-29: 40 meq via ORAL
  Filled 2021-07-29: qty 2

## 2021-07-29 MED ORDER — GABAPENTIN 300 MG PO CAPS: 300.0000 mg | ORAL_CAPSULE | Freq: Two times a day (BID) | ORAL | Status: DC

## 2021-07-29 MED ORDER — DEXTROSE 5 % IV SOLN
700.0000 mg | INTRAVENOUS | Status: DC
  Administered 2021-07-29 – 2021-07-30 (×2): 700 mg via INTRAVENOUS
  Filled 2021-07-29 (×2): qty 14

## 2021-07-29 MED ORDER — THIAMINE HCL 100 MG/ML IJ SOLN
500.0000 mg | Freq: Three times a day (TID) | INTRAVENOUS | Status: AC
  Administered 2021-07-29 – 2021-08-01 (×9): 500 mg via INTRAVENOUS
  Filled 2021-07-29 (×9): qty 5

## 2021-07-29 MED ORDER — LACTATED RINGERS IV SOLN: INTRAVENOUS | Status: DC

## 2021-07-29 MED ORDER — SODIUM CHLORIDE 0.9 % IV SOLN
2.0000 g | Freq: Two times a day (BID) | INTRAVENOUS | Status: DC
  Administered 2021-07-29 – 2021-07-30 (×3): 2 g via INTRAVENOUS
  Filled 2021-07-29 (×4): qty 20

## 2021-07-29 NOTE — Consult Note (Signed)
[facility]    Date of Admission:  07/27/2021     Reason for Consult: ams/sepsis    Referring Provider: Tana Coast     Lines:  Peripheral iv's  Abx: 10/04-c acyclovir iv 10/04-c amp 10/04-c vanc 10/04-c ceftriaxone        Assessment: Acute on chronic encephalopathy Sepsis  85 yo female very functional baseline, recently dxed inflammatory arthritis on chronic prednisone (highest dose 10 mg but down to 6 mg the last several weeks), concern by family for a year progression of s/s of dementia, worsening mentation/delirium the past 1-2 weeks prior to admission, course with sepsis/fever on HD#2 concerning for meningitis  Difficult to say if metabolic encephalopathy from nonspecific febrile syndrome or primary cns process (less likely)  Bcx/ucx so far negative Cxr clear No s/s localizing otherwise outside cns No new medications such as ssri/neuroleptics or any for that matter  Brain mri without classic HSV changes.  For now agree LP for further w/u. Empiric abx including acyclovir. Will adjust/deescalate abx with result  Of note, prednisone dose rather low, but with extreme of age, will also send for fungal process such as crypto (family reports patient with headache complaint of 2-3 weeks and vertigo that may be longer in duration). It is a rather low endemicity area for histo/blasto here and no prodrome story of such so will defer that testing  Plan: Abx as mentioned Await lp. I have also ordered csf meningoencephalitis pcr sent out, besides standard csf lab ordered per Dr Tana Coast Serum cryptococcal ag ordered Discussed with primary team  I spent 60 minute reviewing data/chart, and coordinating care and >50% direct face to face time providing counseling/discussing diagnostics/treatment plan with patient      ------------------------------------------------ Principal Problem:   Altered mental status Active Problems:   Essential  hypertension   CKD (chronic kidney disease) stage 4, GFR 15-29 ml/min (HCC)   Chronic pain   Long term (current) use of systemic steroids   Chronic diastolic CHF (congestive heart failure) ([place])   AMS (altered mental status)    HPI: [name] is a 85 y.o. female relatively healthy admitted 10/02 with acute on chronic confusion, course with HD2 fever/worsening delirium concerning for meningitis   Hx via chart and family  (daughter/grand daughter)  Patient normally very independent. Lives on her own. Gets around with fww. Daughter/granddaughter visit often weekly  Per daughter patient has had  issues with short term memory and personality for the past year concerning for dementia. No formal diagnosis  The last 2-4 weeks appears to be more forgetful. The last week visual hallucination. The day of admission daughter found patient on the floor next to her walker. She didn't appear to be disoriented when awoken. Unclear if fall.   Also has been complaining for the same duration headache. She attributes to vertigo which she has had longer   Takes 6 mg pred for some inflammatory arthritis. Followed by rheum. No higher dose than 10 mg. No other immunosuppressant  No new medications  Patient at baseline urinary incontinence/wears depends. No complaint of any urinary sx No cough, chest pain, dyspnea, rash, joint pain, back pain No sick contact   On admission, afebrile but mild leukocytosis Head ct unremarkable Cxr unremarkable Admission bcx negative On HD2 had fever and slightly worse leukocytosis. Started on bsAbx Mri brain no acute process Planned lp. Id called  Patinet sleeping/hypactive delirium can't speak with her    Family  History  Problem Relation Age of Onset   Colon cancer Mother        in her 87's   Stomach cancer Mother    Lung cancer Brother        was a smoker   Heart attack Son 62   Healthy Daughter     Social History   Tobacco Use   Smoking  status: Never   Smokeless tobacco: Never  Vaping Use   Vaping Use: Never used  Substance Use Topics   Alcohol use: No    Alcohol/week: 0.0 standard drinks   Drug use: No    Allergies  Allergen Reactions   Hydrocodone Itching   Lasix [Furosemide] Other (See Comments)    Dizziness.    Tizanidine Other (See Comments)    Dizzy and fall   Iron Hives and Other (See Comments)     bad constipation     Review of Systems: ROS All Other ROS was negative, except mentioned above   Past Medical History:  Diagnosis Date   Allergic rhinitis 10/30/2016   Anemia    Asthma    Breast cancer of upper-outer quadrant of left female breast ([place]) 11/08/2013   ER/PR+ Her2- Left IDC    Chronic renal insufficiency    Chronic rhinitis    Colon polyp    Diastolic dysfunction 02/01/1447   DJD (degenerative joint disease)    Dyspnea    Full dentures    GERD (gastroesophageal reflux disease)    Hearing loss    Hypertension    Hyponatremia    Impaired glucose tolerance 07/18/2014   Memory loss    Morbid obesity (HCC)    Poor circulation    Vertigo    Wears glasses        Scheduled Meds:  aspirin EC  81 mg Oral q morning   enoxaparin (LOVENOX) injection  30 mg Subcutaneous Q24H   feeding supplement  237 mL Oral BID BM   hydrocortisone sod succinate (SOLU-CORTEF) inj  50 mg Intravenous Q12H   mometasone-formoterol  2 puff Inhalation BID   montelukast  10 mg Oral QHS   multivitamin with minerals  1 tablet Oral Daily   pantoprazole  40 mg Oral Daily   predniSONE  6 mg Oral Q breakfast   Continuous Infusions:  acyclovir 700 mg (07/29/21 1726)   ampicillin (OMNIPEN) IV     cefTRIAXone (ROCEPHIN)  IV 2 g (07/29/21 1731)   lactated ringers 75 mL/hr at 07/29/21 1400   thiamine injection 100 mL/hr at 07/29/21 1339   vancomycin     [START ON 07/30/2021] vancomycin     PRN Meds:.acetaminophen **OR** acetaminophen, albuterol, ondansetron **OR** ondansetron (ZOFRAN) IV   OBJECTIVE: Blood  pressure 95/67, pulse 90, temperature 100.1 F (37.8 C), temperature source Oral, resp. rate 20, height 5' (1.524 m), weight 70.3 kg, SpO2 98 %.  Physical Exam  General/constitutional: sleeping vs hypoactive delirium.  HEENT: Normocephalic, PER, Conj Clear, EOMI, Oropharynx clear Neck supple CV: rrr no mrg Lungs: clear to auscultation, normal respiratory effort Abd: Soft, Nontender Ext: no edema Skin: No Rash Neuro: no rigidity; full neural exam not possible at this time due to mentation MSK: no peripheral joint swelling/tenderness/warmth    Central line presence: no   Lab Results Lab Results  Component Value Date   WBC 16.6 (H) 07/29/2021   HGB 9.3 (L) 07/29/2021   HCT 28.8 (L) 07/29/2021   MCV 99.3 07/29/2021   PLT 188 07/29/2021    Lab Results  Component Value Date   CREATININE 1.61 (H) 07/29/2021   BUN 28 (H) 07/29/2021   NA 143 07/29/2021   K 3.4 (L) 07/29/2021   CL 111 07/29/2021   CO2 25 07/29/2021    Lab Results  Component Value Date   ALT 27 07/29/2021   AST 41 07/29/2021   ALKPHOS 52 07/29/2021   BILITOT 0.8 07/29/2021      Microbiology: Recent Results (from the past 240 hour(s))  Blood culture (routine x 2)     Status: None (Preliminary result)   Collection Time: 07/27/21  4:40 PM   Specimen: BLOOD  Result Value Ref Range Status   Specimen Description   Final    BLOOD SITE NOT SPECIFIED Performed at [facility] [address], [place], [place] [address]    Special Requests   Final    BOTTLES DRAWN AEROBIC AND ANAEROBIC Blood Culture adequate volume Performed at [facility] [address], [place], [place] [address]    Culture   Final    NO GROWTH 2 DAYS Performed at [facility] [address], [place], [place] [address]    Report Status PENDING  Incomplete  Blood culture (routine x 2)     Status: None (Preliminary result)   Collection Time: 07/27/21  4:40 PM   Specimen: BLOOD RIGHT  FOREARM  Result Value Ref Range Status   Specimen Description   Final    BLOOD RIGHT FOREARM Performed at [facility], [place] [address], [place], [place] [address]    Special Requests   Final    BOTTLES DRAWN AEROBIC AND ANAEROBIC Blood Culture results may not be optimal due to an excessive volume of blood received in culture bottles Performed at [facility] [address], [place], [place] [address]    Culture   Final    NO GROWTH 2 DAYS Performed at [facility] [address], [place], [place] [address]    Report Status PENDING  Incomplete  Resp Panel by RT-PCR (Flu A&B, Covid) Nasopharyngeal Swab     Status: None   Collection Time: 07/27/21  4:54 PM   Specimen: Nasopharyngeal Swab; Nasopharyngeal(NP) swabs in vial transport medium  Result Value Ref Range Status   SARS Coronavirus 2 by RT PCR NEGATIVE NEGATIVE Final    Comment: (NOTE) SARS-CoV-2 target nucleic acids are NOT DETECTED.  The SARS-CoV-2 RNA is generally detectable in upper respiratory specimens during the acute phase of infection. The lowest concentration of SARS-CoV-2 viral copies this assay can detect is 138 copies/mL. A negative result does not preclude SARS-Cov-2 infection and should not be used as the sole basis for treatment or other patient management decisions. A negative result may occur with  improper specimen collection/handling, submission of specimen other than nasopharyngeal swab, presence of viral mutation(s) within the areas targeted by this assay, and inadequate number of viral copies(<138 copies/mL). A negative result must be combined with clinical observations, patient history, and epidemiological information. The expected result is Negative.  Fact Sheet for Patients:  EntrepreneurPulse.com.au  Fact Sheet for Healthcare Providers:  IncredibleEmployment.be  This test is no t yet approved or cleared by the [place] FDA  and  has been authorized for detection and/or diagnosis of SARS-CoV-2 by FDA under an Emergency Use Authorization (EUA). This EUA will remain  in effect (meaning this test can be used) for the duration of the COVID-19 declaration under Section 564(b)(1) of the Act, 21 U.S.C.section 360bbb-3(b)(1),  unless the authorization is terminated  or revoked sooner.       Influenza A by PCR NEGATIVE NEGATIVE Final   Influenza B by PCR NEGATIVE NEGATIVE Final    Comment: (NOTE) The Xpert Xpress SARS-CoV-2/FLU/RSV plus assay is intended as an aid in the diagnosis of influenza from Nasopharyngeal swab specimens and should not be used as a sole basis for treatment. Nasal washings and aspirates are unacceptable for Xpert Xpress SARS-CoV-2/FLU/RSV testing.  Fact Sheet for Patients: EntrepreneurPulse.com.au  Fact Sheet for Healthcare Providers: IncredibleEmployment.be  This test is not yet approved or cleared by the [place] FDA and has been authorized for detection and/or diagnosis of SARS-CoV-2 by FDA under an Emergency Use Authorization (EUA). This EUA will remain in effect (meaning this test can be used) for the duration of the COVID-19 declaration under Section 564(b)(1) of the Act, 21 U.S.C. section 360bbb-3(b)(1), unless the authorization is terminated or revoked.  Performed at [facility], [place] [address] [place], [place] [address]   Urine Culture     Status: None   Collection Time: 07/27/21  8:54 PM   Specimen: In/Out Cath Urine  Result Value Ref Range Status   Specimen Description   Final    IN/OUT CATH URINE Performed at [facility] [address] [place], [place] [address]    Special Requests   Final    NONE Performed at [facility], [place] [address], [place], [place] [address]    Culture   Final    NO GROWTH Performed at [facility], [place] [address], [place],   [address]    Report Status 07/28/2021 FINAL  Final     Serology:    Imaging: If present, new imagings (plain films, ct scans, and mri) have been personally visualized and interpreted; radiology reports have been reviewed. Decision making incorporated into the Impression / Recommendations.  07/28/2021 mri brain no contrast No acute or reversible finding. Mild age related volume loss. Mild to moderate chronic small-vessel ischemic changes of the cerebral hemispheric white matter.  10/02 ct cervical spine No acute osseous process  10/02 cxr No active acute cardiopulm process  Jabier Mutton, [place] [facility] [phone] pager    07/29/2021, 5:42 PM

## 2021-07-29 NOTE — Progress Notes (Addendum)
[facility]                                                                              Patient Demographics  [name], is a 85 y.o. female, DOB - Mar 24, 1936, [place] date - 07/27/2021   Admitting Physician Etta Quill, DO  Outpatient Primary MD for the patient is Biagio Borg, MD  Outpatient specialists:   LOS - 0  days   Medical records reviewed and are as summarized below:    No chief complaint on file.      Brief summary   Patient is 85 year old female with history of diastolic CHF, hypertension, CKD stage 3-4, chronic steroid use presented with generalized weakness, fatigue for few weeks.  Patient also had waxing and waning mental status per family.  Patient presented to ED with fall and altered mental status.  At baseline lives alone and ambulates with a walker.  Daughter spoke with patient a day before the admission.  On the day of admission she called and patient was not answering the phone and she went to the house.  Patient was found on the floor, down for unknown period of time, confused, did not realize she had fallen on the floor. Patient mental status had improved somewhat and was alert in ED with slow mentation. Per family, had malodorous urine In ED, BUN 28, creatinine 1.37, sodium 142, magnesium 1.3, ammonia level 38.  CK elevated 1200, troponin 50-> 54, lactic acid 2.4, leukocytosis 12.6  Chest x-ray negative for any pneumonia CT head showed no acute intracranial process.  CT C-spine showed no fracture Pelvic x-ray negative for any fracture EKG showed rate 86, normal sinus rhythm, no acute ST-T wave change suggestive of ischemia.  Assessment & Plan    Principal Problem: Acute metabolic encephalopathy, acute rhabdomyolysis, fall -Unclear etiology, possibly due to polypharmacy, Neurontin and Ultram in the setting of CKD.  No infectious cause, UA showed ketones however no UTI -Has rhabdomyolysis due to fall -CT head  and MRI of the brain negative for any stroke.   -TSH, B12, folate within normal limits, B1 pending -Discussed in detail with the patient's daughter regarding her mental status.  Patient has been having memory issues for >6 months and has been worsening, having falls, not eating or having difficulty feeding herself, dropping food, paranoid behavior.  Also has home health physical therapy. -Neurontin, Ultram has been stopped, will follow ABG and ammonia level however likely this may be the progression of dementia or may have underlying Parkinson's given the tremors. -Per daughter's request, discussed with neurology, Dr Curly Shores in detail.  Agreed with the current work-up and recommended to add thiamine 500 mg IV every 8 hours x 3days, will evaluate for further recommendation.  Active Problems: Mildly elevated troponin, history of diastolic CHF - EKG negative for any acute ST-T wave changes, and currently no chest pain or shortness of breath - 2D echo showed EF of 70 to 83%, grade 1 diastolic dysfunction, no regional wall motion abnormalities renal function -Continue to hold off on Lasix, for now due to creatinine trending up  Leukocytosis -WBC count trending up today  however afebrile, has been on prednisone, no current signs of infection.  UA negative for UTI, chest x-ray negative for PNA. - will check procalcitonin, ESR, CRP, follow counts, hold off on antibiotics unless source clear Addendum: Now spiking low grade temp. pCT <0.1, CRP 10.1, ESR normal 14. BP soft - DC losartan, norvasc. IV fluid bolus x1 - check cortisol level, will give stress dose steroids x 2 given patent has been on long term prednisone  -repeat cultures. Ordered stat LP with labs, d/w Dr Gale Journey (ID), will add broad spectrum antibiotics, follow CSF studies  CKD stage IIIb -Hold Lasix, creatinine trended up to 1.6, has not been eating well -Baseline creatinine 1.5-1.6  History of chondrocalcinosis, osteoarthritis of hands and  shoulders -Patient follows rheumatology, Dr. Judithann Sheen -- Has been on chronic steroids for many years.  Currently on prednisone 6 mg daily, has not been able to tolerate prednisone taper outpatient.   -will continue current dose, follow-up patient with rheumatology.  History of asthma -Currently stable, no wheezing  Obesity Estimated body mass index is 30.27 kg/m as calculated from the following:   Height as of this encounter: 5' (1.524 m).   Weight as of this encounter: 70.3 kg.  Code Status: Full CODE STATUS DVT Prophylaxis:  enoxaparin (LOVENOX) injection 30 mg Start: 07/28/21 1000   Level of Care: Level of care: Telemetry Family Communication: Called patient's daughter on the phone, updated in detail.   Disposition Plan:     Status is: Observation  The patient will require care spanning > 2 midnights and should be moved to inpatient because: Inpatient level of care appropriate due to severity of illness  Dispo: The patient is from: Home              Anticipated d/c is to: Home              Patient currently is not medically stable to d/c.  Still waxing and waning mental status, creatinine trending up   Difficult to place patient No      Time Spent in minutes 35 minutes  Procedures:  None  Consultants:   Neurology Antimicrobials:   Anti-infectives (From admission, onward)    Start     Dose/Rate Route Frequency Ordered Stop   07/27/21 2200  vancomycin (VANCOREADY) IVPB 1500 mg/300 mL        1,500 mg 150 mL/hr over 120 Minutes Intravenous  Once 07/27/21 2110 07/28/21 0017   07/27/21 2100  cefTRIAXone (ROCEPHIN) 1 g in sodium chloride 0.9 % 100 mL IVPB        1 g 200 mL/hr over 30 Minutes Intravenous  Once 07/27/21 2049 07/27/21 2159          Medications  Scheduled Meds:  amLODipine  5 mg Oral q morning   aspirin EC  81 mg Oral q morning   enoxaparin (LOVENOX) injection  30 mg Subcutaneous Q24H   feeding supplement  237 mL Oral BID BM   lip balm        losartan  100 mg Oral q morning   mometasone-formoterol  2 puff Inhalation BID   montelukast  10 mg Oral QHS   multivitamin with minerals  1 tablet Oral Daily   pantoprazole  40 mg Oral Daily   potassium chloride  40 mEq Oral Once   predniSONE  6 mg Oral Q breakfast   Continuous Infusions:  lactated ringers     PRN Meds:.acetaminophen **OR** acetaminophen, albuterol, ondansetron **OR** ondansetron (ZOFRAN) IV  Subjective:   Contessa Cutrona was seen and examined today.  Somewhat more alert and oriented from yesterday however has waxing and waning status.  Currently no acute chest pain, shortness of breath, fevers or chills.   Objective:   Vitals:   07/29/21 0639 07/29/21 0700 07/29/21 0850 07/29/21 0925  BP: (!) 115/57 (!) 111/57  129/69  Pulse: 96 95  94  Resp: (!) 22 (!) 22  (!) 24  Temp: 97.6 F (36.4 C) 99.3 F (37.4 C) 99.1 F (37.3 C) 99.8 F (37.7 C)  TempSrc:  Oral Oral Oral  SpO2: 94% 96%  98%  Weight:      Height:        Intake/Output Summary (Last 24 hours) at 07/29/2021 1020 Last data filed at 07/28/2021 1526 Gross per 24 hour  Intake 50 ml  Output --  Net 50 ml     Wt Readings from Last 3 Encounters:  07/27/21 70.3 kg  07/22/21 70.3 kg  07/01/21 70.3 kg   Physical Exam General: Alert and oriented x self, time  NAD Cardiovascular: S1 S2 clear, RRR. No pedal edema b/l Respiratory: CTAB, no wheezing,  Gastrointestinal: Soft, nontender, nondistended, NBS Ext: no pedal edema bilaterally Neuro: no new deficits Skin: No rashes     Data Reviewed:  I have personally reviewed following labs and imaging studies  Micro Results Recent Results (from the past 240 hour(s))  Blood culture (routine x 2)     Status: None (Preliminary result)   Collection Time: 07/27/21  4:40 PM   Specimen: BLOOD  Result Value Ref Range Status   Specimen Description   Final    BLOOD SITE NOT SPECIFIED Performed at [facility]  [address] [place], [place] [address]    Special Requests   Final    BOTTLES DRAWN AEROBIC AND ANAEROBIC Blood Culture adequate volume Performed at [facility] [address], [place], [place] [address]    Culture   Final    NO GROWTH 2 DAYS Performed at [facility] [address], [place], [place] [address]    Report Status PENDING  Incomplete  Blood culture (routine x 2)     Status: None (Preliminary result)   Collection Time: 07/27/21  4:40 PM   Specimen: BLOOD RIGHT FOREARM  Result Value Ref Range Status   Specimen Description   Final    BLOOD RIGHT FOREARM Performed at [facility], [place] [address] [place], [place] [address]    Special Requests   Final    BOTTLES DRAWN AEROBIC AND ANAEROBIC Blood Culture results may not be optimal due to an excessive volume of blood received in culture bottles Performed at [facility] [address], [place], [place] [address]    Culture   Final    NO GROWTH 2 DAYS Performed at [facility] [address] [place], [place] [address]    Report Status PENDING  Incomplete  Resp Panel by RT-PCR (Flu A&B, Covid) Nasopharyngeal Swab     Status: None   Collection Time: 07/27/21  4:54 PM   Specimen: Nasopharyngeal Swab; Nasopharyngeal(NP) swabs in vial transport medium  Result Value Ref Range Status   SARS Coronavirus 2 by RT PCR NEGATIVE NEGATIVE Final    Comment: (NOTE) SARS-CoV-2 target nucleic acids are NOT DETECTED.  The SARS-CoV-2 RNA is generally detectable in upper respiratory specimens during the acute phase of infection. The lowest concentration of SARS-CoV-2 viral copies this  assay can detect is 138 copies/mL. A negative result does not preclude SARS-Cov-2 infection and should not be used as the sole basis for treatment or other patient management decisions. A negative result may occur with  improper specimen collection/handling, submission of specimen other than  nasopharyngeal swab, presence of viral mutation(s) within the areas targeted by this assay, and inadequate number of viral copies(<138 copies/mL). A negative result must be combined with clinical observations, patient history, and epidemiological information. The expected result is Negative.  Fact Sheet for Patients:  EntrepreneurPulse.com.au  Fact Sheet for Healthcare Providers:  IncredibleEmployment.be  This test is no t yet approved or cleared by the [place] FDA and  has been authorized for detection and/or diagnosis of SARS-CoV-2 by FDA under an Emergency Use Authorization (EUA). This EUA will remain  in effect (meaning this test can be used) for the duration of the COVID-19 declaration under Section 564(b)(1) of the Act, 21 U.S.C.section 360bbb-3(b)(1), unless the authorization is terminated  or revoked sooner.       Influenza A by PCR NEGATIVE NEGATIVE Final   Influenza B by PCR NEGATIVE NEGATIVE Final    Comment: (NOTE) The Xpert Xpress SARS-CoV-2/FLU/RSV plus assay is intended as an aid in the diagnosis of influenza from Nasopharyngeal swab specimens and should not be used as a sole basis for treatment. Nasal washings and aspirates are unacceptable for Xpert Xpress SARS-CoV-2/FLU/RSV testing.  Fact Sheet for Patients: EntrepreneurPulse.com.au  Fact Sheet for Healthcare Providers: IncredibleEmployment.be  This test is not yet approved or cleared by the [place] FDA and has been authorized for detection and/or diagnosis of SARS-CoV-2 by FDA under an Emergency Use Authorization (EUA). This EUA will remain in effect (meaning this test can be used) for the duration of the COVID-19 declaration under Section 564(b)(1) of the Act, 21 U.S.C. section 360bbb-3(b)(1), unless the authorization is terminated or revoked.  Performed at [facility], [place] [address], [place], [place] [address]   Urine Culture     Status: None   Collection Time: 07/27/21  8:54 PM   Specimen: In/Out Cath Urine  Result Value Ref Range Status   Specimen Description   Final    IN/OUT CATH URINE Performed at [facility] [address] [place], [place] [address]    Special Requests   Final    NONE Performed at [facility], [place] [address] [place], [place] [address]    Culture   Final    NO GROWTH Performed at [facility], [place] [address], [place], [place] [address]    Report Status 07/28/2021 FINAL  Final    Radiology Reports DG Chest 2 View  Result Date: 07/27/2021 CLINICAL DATA:  Fall. EXAM: CHEST - 2 VIEW COMPARISON:  Chest x-ray 06/18/2021. FINDINGS: The heart size and mediastinal contours are within normal limits. Both lungs are clear. The visualized skeletal structures are unremarkable. IMPRESSION: No active cardiopulmonary disease. Electronically Signed   By: Ronney Asters M.D.   On: 07/27/2021 17:54   DG Pelvis 1-2 Views  Result Date: 07/27/2021 CLINICAL DATA:  Fall. EXAM: PELVIS - 1-2 VIEW COMPARISON:  Right hip x-ray 06/27/2021. FINDINGS: There is no evidence of pelvic fracture or diastasis. No pelvic bone lesions are seen. There are degenerative changes of the lower lumbar spine and sacroiliac joints. Vascular calcifications are noted in the soft tissues. There is a large amount of stool in the rectum. IMPRESSION: Negative. Electronically Signed   By: Ronney Asters  M.D.   On: 07/27/2021 17:55   CT HEAD WO CONTRAST (5MM)  Result Date: 07/27/2021 CLINICAL DATA:  Fall with head and neck injury. EXAM: CT HEAD WITHOUT CONTRAST CT CERVICAL SPINE WITHOUT CONTRAST TECHNIQUE: Multidetector CT imaging of the head and cervical spine was performed following the standard protocol without intravenous contrast. Multiplanar CT image reconstructions of the cervical spine were also generated. COMPARISON:  CT head and cervical  spine dated 01/28/2018. FINDINGS: CT HEAD FINDINGS Brain: No evidence of acute infarction, hemorrhage, hydrocephalus, extra-axial collection or mass lesion/mass effect. Periventricular white matter hypoattenuation likely represents chronic small vessel ischemic disease. There is mild cerebral volume loss with associated ex vacuo dilatation. Vascular: There are vascular calcifications in the carotid siphons. Skull: Normal. Negative for fracture or focal lesion. Sinuses/Orbits: No acute finding. Other: None. CT CERVICAL SPINE FINDINGS Alignment: There is 4 mm anterolisthesis of C3 on C4, unchanged. There is mild dextrocurvature centered at C3-4 which is similar to prior exam and may be positional. Skull base and vertebrae: No acute fracture. No primary bone lesion or focal pathologic process. Soft tissues and spinal canal: No prevertebral fluid or swelling. No visible canal hematoma. Disc levels: Up to severe multilevel degenerative disc and joint disease. Upper chest: Negative. Other: None. IMPRESSION: 1. No acute intracranial process. 2. No acute osseous injury in the cervical spine. Electronically Signed   By: Zerita Boers M.D.   On: 07/27/2021 19:02   CT Cervical Spine Wo Contrast  Result Date: 07/27/2021 CLINICAL DATA:  Fall with head and neck injury. EXAM: CT HEAD WITHOUT CONTRAST CT CERVICAL SPINE WITHOUT CONTRAST TECHNIQUE: Multidetector CT imaging of the head and cervical spine was performed following the standard protocol without intravenous contrast. Multiplanar CT image reconstructions of the cervical spine were also generated. COMPARISON:  CT head and cervical spine dated 01/28/2018. FINDINGS: CT HEAD FINDINGS Brain: No evidence of acute infarction, hemorrhage, hydrocephalus, extra-axial collection or mass lesion/mass effect. Periventricular white matter hypoattenuation likely represents chronic small vessel ischemic disease. There is mild cerebral volume loss with associated ex vacuo dilatation.  Vascular: There are vascular calcifications in the carotid siphons. Skull: Normal. Negative for fracture or focal lesion. Sinuses/Orbits: No acute finding. Other: None. CT CERVICAL SPINE FINDINGS Alignment: There is 4 mm anterolisthesis of C3 on C4, unchanged. There is mild dextrocurvature centered at C3-4 which is similar to prior exam and may be positional. Skull base and vertebrae: No acute fracture. No primary bone lesion or focal pathologic process. Soft tissues and spinal canal: No prevertebral fluid or swelling. No visible canal hematoma. Disc levels: Up to severe multilevel degenerative disc and joint disease. Upper chest: Negative. Other: None. IMPRESSION: 1. No acute intracranial process. 2. No acute osseous injury in the cervical spine. Electronically Signed   By: Zerita Boers M.D.   On: 07/27/2021 19:02   MR BRAIN WO CONTRAST  Result Date: 07/28/2021 CLINICAL DATA:  Mental status changes of unknown cause. EXAM: MRI HEAD WITHOUT CONTRAST TECHNIQUE: Multiplanar, multiecho pulse sequences of the brain and surrounding structures were obtained without intravenous contrast. COMPARISON:  Head CT yesterday. FINDINGS: Brain: Diffusion imaging does not show any acute or subacute infarction. No focal abnormality affects the brainstem or cerebellum. Cerebral hemispheres show mild age related volume loss with mild to moderate chronic small-vessel ischemic change of the white matter. No cortical or large vessel territory infarction. No mass lesion, hemorrhage, hydrocephalus or extra-axial collection. Vascular: Major vessels at the base of the brain show flow. Skull and upper cervical spine:  Negative Sinuses/Orbits: Clear/normal Other: None IMPRESSION: No acute or reversible finding. Mild age related volume loss. Mild to moderate chronic small-vessel ischemic changes of the cerebral hemispheric white matter. Electronically Signed   By: Nelson Chimes M.D.   On: 07/28/2021 08:03   ECHOCARDIOGRAM COMPLETE  Result  Date: 07/28/2021    ECHOCARDIOGRAM REPORT   Patient Name:   KADIJAH SHAMOON Date of Exam: 07/28/2021 Medical Rec #:  [id number]            Height:       60.0 in Accession #:    [id number]           Weight:       155.0 lb Date of Birth:  [birth year]             BSA:          1.675 m Patient Age:    61 years             BP:           131/72 mmHg Patient Gender: F                    HR:           105 bpm. Exam Location:  Inpatient Procedure: 2D Echo, Cardiac Doppler, Color Doppler and Intracardiac            Opacification Agent Indications:    Elevated Troponin                 Syncope R55  History:        Patient has prior history of Echocardiogram examinations, most                 recent 03/07/2018. CHF; Risk Factors:Hypertension. Chronic Kidney                 Disease. Asthma. GERD.  Sonographer:    Darlina Sicilian RDCS Referring Phys: [place]  1. Left ventricular ejection fraction, by estimation, is 70 to 75%. The left ventricle has hyperdynamic function. The left ventricle has no regional wall motion abnormalities. There is mild left ventricular hypertrophy. Left ventricular diastolic parameters are consistent with Grade I diastolic dysfunction (impaired relaxation).  2. Right ventricular systolic function is normal. The right ventricular size is normal.  3. The mitral valve is normal in structure. No evidence of mitral valve regurgitation. No evidence of mitral stenosis.  4. The aortic valve is tricuspid. Aortic valve regurgitation is not visualized. Mild aortic valve sclerosis is present, with no evidence of aortic valve stenosis.  5. The inferior vena cava is normal in size with greater than 50% respiratory variability, suggesting right atrial pressure of 3 mmHg. FINDINGS  Left Ventricle: Left ventricular ejection fraction, by estimation, is 70 to 75%. The left ventricle has hyperdynamic function. The left ventricle has no regional wall motion abnormalities. Definity contrast agent was  given IV to delineate the left ventricular endocardial borders. The left ventricular internal cavity size was normal in size. There is mild left ventricular hypertrophy. Left ventricular diastolic parameters are consistent with Grade I diastolic dysfunction (impaired relaxation). Right Ventricle: The right ventricular size is normal. Right ventricular systolic function is normal. Left Atrium: Left atrial size was normal in size. Right Atrium: Right atrial size was normal in size. Pericardium: Trivial pericardial effusion is present. Mitral Valve: The mitral valve is normal in structure. No evidence of mitral valve regurgitation. No evidence of mitral valve stenosis.  Tricuspid Valve: The tricuspid valve is normal in structure. Tricuspid valve regurgitation is mild . No evidence of tricuspid stenosis. Aortic Valve: The aortic valve is tricuspid. Aortic valve regurgitation is not visualized. Mild aortic valve sclerosis is present, with no evidence of aortic valve stenosis. Pulmonic Valve: The pulmonic valve was normal in structure. Pulmonic valve regurgitation is trivial. No evidence of pulmonic stenosis. Aorta: The aortic root is normal in size and structure. Venous: The inferior vena cava is normal in size with greater than 50% respiratory variability, suggesting right atrial pressure of 3 mmHg. IAS/Shunts: The interatrial septum is aneurysmal. The interatrial septum was not well visualized.  LEFT VENTRICLE PLAX 2D LVIDd:         3.00 cm  Diastology LVIDs:         1.80 cm  LV e' medial:    4.57 cm/s LV PW:         1.20 cm  LV E/e' medial:  10.9 LV IVS:        1.30 cm  LV e' lateral:   4.79 cm/s LVOT diam:     2.10 cm  LV E/e' lateral: 10.4 LV SV:         35 LV SV Index:   21 LVOT Area:     3.46 cm  RIGHT VENTRICLE RV S prime:     17.80 cm/s TAPSE (M-mode): 1.5 cm LEFT ATRIUM           Index LA Vol (A4C): 23.8 ml 14.21 ml/m  AORTIC VALVE LVOT Vmax:   92.00 cm/s LVOT Vmean:  61.100 cm/s LVOT VTI:    0.100 m  AORTA Ao  Root diam: 3.10 cm Ao Asc diam:  2.90 cm MITRAL VALVE MV Area (PHT): 3.74 cm    SHUNTS MV Decel Time: 203 msec    Systemic VTI:  0.10 m MV E velocity: 49.70 cm/s  Systemic Diam: 2.10 cm MV A velocity: 72.80 cm/s MV E/A ratio:  0.68 Kirk Ruths MD Electronically signed by Kirk Ruths MD Signature Date/Time: 07/28/2021/1:56:43 PM    Final     Lab Data:  CBC: Recent Labs  Lab 07/27/21 1640 07/28/21 0426 07/29/21 0519  WBC 12.6* 12.8* 16.6*  NEUTROABS 10.8*  --   --   HGB 12.0 11.1* 9.3*  HCT 38.2 34.9* 28.8*  MCV 100.8* 101.5* 99.3  PLT 273 234 032   Basic Metabolic Panel: Recent Labs  Lab 07/27/21 1640 07/28/21 0005 07/28/21 0426 07/29/21 0519  NA 142  --  143 143  K 3.7  --  3.1* 3.4*  CL 102  --  107 111  CO2 28  --  28 25  GLUCOSE 143*  --  99 137*  BUN 28*  --  22 28*  CREATININE 1.37*  --  1.18* 1.61*  CALCIUM 9.9  --  9.1 9.0  MG  --  1.3*  --   --    GFR: Estimated Creatinine Clearance: 23.2 mL/min (A) (by C-G formula based on SCr of 1.61 mg/dL (H)). Liver Function Tests: Recent Labs  Lab 07/27/21 1640 07/28/21 0426 07/29/21 0519  AST 57* 56* 41  ALT 34 29 27  ALKPHOS 73 63 52  BILITOT 1.0 0.8 0.8  PROT 7.3 6.2* 5.6*  ALBUMIN 3.8 3.1* 2.7*   No results for input(s): LIPASE, AMYLASE in the last 168 hours. Recent Labs  Lab 07/28/21 0005  AMMONIA 38*   Coagulation Profile: Recent Labs  Lab 07/27/21 1640  INR 1.0   Cardiac  Enzymes: Recent Labs  Lab 07/27/21 1640 07/28/21 0426 07/28/21 1228  CKTOTAL 1,200* 1,169* 1,007*   BNP (last 3 results) Recent Labs    06/18/21 1648  PROBNP 56.0   HbA1C: No results for input(s): HGBA1C in the last 72 hours. CBG: Recent Labs  Lab 07/29/21 0915  GLUCAP 137*   Lipid Profile: No results for input(s): CHOL, HDL, LDLCALC, TRIG, CHOLHDL, LDLDIRECT in the last 72 hours. Thyroid Function Tests: Recent Labs    07/28/21 0005  TSH 1.404   Anemia Panel: Recent Labs    07/28/21 1228   VITAMINB12 5,776*  FOLATE 50.0   Urine analysis:    Component Value Date/Time   COLORURINE YELLOW 07/27/2021 2053   APPEARANCEUR CLEAR 07/27/2021 2053   LABSPEC 1.009 07/27/2021 2053   PHURINE 6.0 07/27/2021 2053   GLUCOSEU NEGATIVE 07/27/2021 2053   GLUCOSEU NEGATIVE 08/01/2020 1620   HGBUR MODERATE (A) 07/27/2021 2053   BILIRUBINUR NEGATIVE 07/27/2021 2053   KETONESUR 5 (A) 07/27/2021 2053   PROTEINUR NEGATIVE 07/27/2021 2053   UROBILINOGEN 0.2 08/01/2020 1620   NITRITE NEGATIVE 07/27/2021 2053   LEUKOCYTESUR NEGATIVE 07/27/2021 2053     Gissella Niblack M.D. [facility] 07/29/2021, 10:20 AM  Available via Epic secure chat 7am-7pm After 7 pm, please refer to night coverage provider listed on amion.  Addendum I spoke with Dr Vernard Gambles (IR) and Dr. Nelia Shi (diagnostic radiology on call) in the evening yesterday on 10/4 regarding spinal tap.  Per Dr. Nelia Shi, spinal tap would be completed within next 1 to 2 hours and he will arrange that.  This was communicated to the patient's nurse and neurologist consultant, Dr Curly Shores.     Estill Cotta M.D.  [facility] 07/30/2021, 8:46 AM

## 2021-07-29 NOTE — Evaluation (Signed)
Physical Therapy Evaluation Patient Details Name: JAMYAH FOLK MRN: [id number] DOB: Mar 13, 1936 Today's Date: 07/29/2021  History of Present Illness   Patient is 85 y.o. female presented to ED after fall and with AMS. At baseline lives alone and ambulates with a walker. Patient was not answering the phone on day of admission and her daughter went to the house and found the pt on the floor. Patient admitted for acute metabolic encephalopathy, acute rhabdomyolysis, fall. ED imaging negative for acute injury. PMH significant for asthma, breast cancer, dyspnea, GERD, HTN, memory loss, vertigo, obseity, bil TKA, back surgery.    Clinical Impression  Elspeth RACHELLA BASDEN is 85 y.o. female admitted with above HPI and diagnosis. Patient is currently limited by functional impairments below (see PT problem list). Patient lives alone and is independent with RW for household mobility at baseline per chart/daughter. Currently pt requires Max assist for bed mobility and was limited by significant weakness and fatigue to sitting EOB. Pt will require 2+ assist for safety with transfers. Patient will benefit from continued skilled PT interventions to address impairments and progress independence with mobility, recommending rehab at SNF with 24/7 assist. Acute PT will follow and progress as able.        Recommendations for follow up therapy are one component of a multi-disciplinary discharge planning process, led by the attending physician.  Recommendations may be updated based on patient status, additional functional criteria and insurance authorization.  Follow Up Recommendations SNF;Supervision/Assistance - 24 hour;     Equipment Recommendations    None recommended by PT;    Recommendations for Other Services   OT consult;    Precautions / Restrictions Precautions Precautions: Fall Restrictions Weight Bearing Restrictions: No      Mobility  Bed Mobility Overal bed mobility: Needs  Assistance Bed Mobility: Supine to Sit;Sit to Supine     Supine to sit: Max assist;HOB elevated Sit to supine: Max assist   General bed mobility comments: Max assist with multimodal cues for sequencing use of bed rail and assist to bring LE's off EOB and raise trunk. pt required assist to obtain stable posture sitting EOB with bil UE support. As pt fatigued Lt lateral and posterior lean increased. Max Assist to return to supine adn 2+ to boost in bed.    Transfers                 General transfer comment: deffered due to fatigue and safety  Ambulation/Gait                Stairs            Wheelchair Mobility    Modified Rankin (Stroke Patients Only)       Balance Overall balance assessment: Needs assistance Sitting-balance support: Feet supported;Bilateral upper extremity supported Sitting balance-Leahy Scale: Poor Sitting balance - Comments: reliant on UE support for balance                                     Pertinent Vitals/Pain Pain Assessment: No/denies pain    Home Living Family/patient expects to be discharged to:: Private residence Living Arrangements: Children;Alone Available Help at Discharge: Family Type of Home: House           Additional Comments: pt unable to provide full history due to waxing and waning cognition.    Prior Function Level of Independence: Independent with assistive device(s)  Comments: per chart review and daughter pt was ambulating in home with RW and no assist.     Hand Dominance        Extremity/Trunk Assessment   Upper Extremity Assessment Upper Extremity Assessment: Defer to OT evaluation    Lower Extremity Assessment Lower Extremity Assessment: Generalized weakness    Cervical / Trunk Assessment Cervical / Trunk Assessment: Normal  Communication   Communication: HOH  Cognition Arousal/Alertness: Awake/alert Behavior During Therapy: WFL for tasks  assessed/performed Overall Cognitive Status: No family/caregiver present to determine baseline cognitive functioning                                 General Comments: pt appears A&O to self, situation, place. pt pleasant but drowsy. pt with waxing/waning congintion throughout and requires repeated cues/stimulation to remain alert at start. Alertness improved initially with sitting EOB andt hen declined as pt fatigued.      General Comments      Exercises     Assessment/Plan    PT Assessment Patient needs continued PT services  PT Problem List Decreased strength;Decreased activity tolerance;Decreased balance;Decreased mobility;Decreased knowledge of use of DME;Decreased cognition;Decreased safety awareness;Decreased knowledge of precautions;Obesity       PT Treatment Interventions DME instruction;Gait training;Functional mobility training;Therapeutic activities;Therapeutic exercise;Balance training;Patient/family education    PT Goals (Current goals can be found in the Care Plan section)     07/29/21 1145  Acute Rehab PT Goals  Patient Stated Goal pt's daughter hoping to become her mother full time caregiver  PT Goal Formulation With patient  Time For Goal Achievement 08/05/21  Potential to Achieve Goals Good      Frequency Min 3X/week   Barriers to discharge Decreased caregiver support pt lives alone    Co-evaluation               AM-PAC PT "6 Clicks" Mobility  Outcome Measure    07/29/21 7741  AM-PAC PT "6 Clicks" Mobility Outcome Measure (Version 2)  Help needed turning from your back to your side while in a flat bed without using bedrails? 1  Help needed moving from lying on your back to sitting on the side of a flat bed without using bedrails? 1  Help needed moving to and from a bed to a chair (including a wheelchair)? 1  Help needed standing up from a chair using your arms (e.g., wheelchair or bedside chair)? 1  Help needed to walk in hospital  room? 1  Help needed climbing 3-5 steps with a railing?  1  6 Click Score 6  Consider Recommendation of Discharge To: CIR/SNF/LTACH                  End of Session Equipment Utilized During Treatment: Gait belt Activity Tolerance: Patient limited by fatigue Patient left: in bed;with call bell/phone within reach;with bed alarm set Nurse Communication: Mobility status PT Visit Diagnosis: Other abnormalities of gait and mobility (R26.89);Muscle weakness (generalized) (M62.81);Difficulty in walking, not elsewhere classified (R26.2)     07/29/21 1145  PT Time Calculation  PT Start Time (ACUTE ONLY) 1120  PT Stop Time (ACUTE ONLY) 1151  PT Time Calculation (min) (ACUTE ONLY) 31 min  PT General Charges  $$ ACUTE PT VISIT 1 Visit  PT Evaluation  $PT Eval Moderate Complexity 1 Mod  PT Treatments  $Therapeutic Activity 8-22 mins        Gwynneth Albright PT, DPT Acute Rehabilitation Services  Office [phone] Pager [phone]   Jacques Navy 07/29/2021, 3:44 PM

## 2021-07-29 NOTE — Consult Note (Signed)
Neurology Consultation  Reason for Consult: Acute metabolic encephalopathy Referring Physician: Dr. Tana Coast  CC: Altered mental status with confusion and lethargy  History is obtained from: Chart review, Patient's daughter, [name] at bedside, unable to obtain from patient due to patient's mental status  HPI: [name] is a 85 y.o. female with a medical history significant for essential hypertension, morbid obesity, GERD, DJD, chronic diastolic HF, chronic kidney disease stage 3-4, and chronic steroid use who presented to [facility] 10/2 after being found disoriented on the floor by patient's daughter.  Per patient's daughter, she had been with her Saturday night 10/1 until 18:00 when she left to pick up food for dinner and states that her mother was on the phone with a friend at that time. She states that as she was picking her food up at 18:30, she called [name] to ask if she wanted anything for dinner but [name] declined. Her daughter, [name], had plans to visit with her on Sunday as she was taking steps to move in with her mother to help her out around the house. On Sunday as she was on the way to her mother's house, she called [name] without answer. When she got to her house at around 1:00 PM she found her mother laying in the kitchen floor with her walker pushed outside of the kitchen. Her mother was awake and did not remember falling. [name] states that [name] was laying on the floor like she would lay in her bed and her door was open and the radio was on as if [name] had gotten up that morning as she usually would. [name] told her daughter that she didn't fall and that she was not on the floor. She was also telling her daughter that there were older and younger women in her house that came in, did not say anything to her, and began to look around. She states that they took a picture down from her living room and one of them curled up in the recliner that only [name]  lays in. She does note that her mother has been complaining recently of a nagging headache and for the past 1-2 weeks has complained of some neck pain and soreness. While she has been hospitalized, her mental status has been reported to wax and wane and she has had temperatures that have been progressively increasing despite acetaminophen administration.   Of note, [name] states that [name] also fell approximately 4 weeks ago but is able to recall events surrounding the fall at that time. She states that during that fall she felt the room spinning prior to falling. After this fall, she had been instructed to decrease her Lasix from daily to every other day with concern that the Lasix may have contributed to her fall. She did not understand the instructions and threw her prescription away. She was recently was seen and placed on the Lasix every other day for management of CHF and concern for fluid overload.   At baseline, [name] lives alone and ambulates with assistance from a walker but is able to complete her activities of daily living independently. Daughter states that she is typically oriented without any cognitive impairment and manages her own medications. She does state that over the past 6 months she has noted hypersomnolence, some trouble with stammering and recall, and a personality change described as less tolerant and more negatively vocal to family and neighbors. She has also noticed that over the past 4-6  months, her mother has had a tremor involving her head and bilateral hands that is not present at rest but occurs with intention. Her daughter states that over the past 4-6 months her mother seems to prefer sleeping over eating. She states that just over 6 months ago they were traveling together for [name]'s business to different cities and enjoying their time together where her mother seemed more lively and more like herself.    ROS: Unable to obtain due to altered mental status.    Past Medical History:  Diagnosis Date   Allergic rhinitis 10/30/2016   Anemia    Asthma    Breast cancer of upper-outer quadrant of left female breast ([name]) 11/08/2013   ER/PR+ Her2- Left IDC    Chronic renal insufficiency    Chronic rhinitis    Colon polyp    Diastolic dysfunction 9/89/2119   DJD (degenerative joint disease)    Dyspnea    Full dentures    GERD (gastroesophageal reflux disease)    Hearing loss    Hypertension    Hyponatremia    Impaired glucose tolerance 07/18/2014   Memory loss    Morbid obesity ([name])    Poor circulation    Vertigo    Wears glasses    Past Surgical History:  Procedure Laterality Date   ABDOMINAL HYSTERECTOMY     BREAST LUMPECTOMY WITH NEEDLE LOCALIZATION AND AXILLARY SENTINEL LYMPH NODE BX Left 12/04/2013   Procedure: BREAST LUMPECTOMY WITH NEEDLE LOCALIZATION AND AXILLARY SENTINEL LYMPH NODE BX;  Surgeon: Shann Medal, MD;  Location: [place];  Service: General;  Laterality: Left;   CATARACT EXTRACTION  2009   rt   COLONOSCOPY     EYE SURGERY Bilateral    cataract surgery   KNEE ARTHROSCOPY     both   LUMBAR LAMINECTOMY/DECOMPRESSION MICRODISCECTOMY Left 12/23/2017   Procedure: Left Lumbar One-Two Laminectomy with microdiscectomy;  Surgeon: Eustace Moore, MD;  Location: [place];  Service: Neurosurgery;  Laterality: Left;  Left L1-2 Laminectomy with microdiscectomy   TONSILLECTOMY     TOTAL KNEE ARTHROPLASTY  2002   rt   TOTAL KNEE ARTHROPLASTY  2003   left   VESICOVAGINAL FISTULA CLOSURE W/ TAH  1980   Family History  Problem Relation Age of Onset   Colon cancer Mother        in her 15's   Stomach cancer Mother    Lung cancer Brother        was a smoker   Heart attack Son 72   Healthy Daughter    Social History:   reports that she has never smoked. She has never used smokeless tobacco. She reports that she does not drink alcohol and does not use drugs.  Medications  Current Facility-Administered  Medications:    acetaminophen (TYLENOL) tablet 650 mg, 650 mg, Oral, Q6H PRN, 650 mg at 07/29/21 1036 **OR** acetaminophen (TYLENOL) suppository 650 mg, 650 mg, Rectal, Q6H PRN, Alcario Drought, Jared M, DO   albuterol (PROVENTIL) (2.5 MG/3ML) 0.083% nebulizer solution 2.5 mg, 2.5 mg, Inhalation, Q6H PRN, Alcario Drought, Jared M, DO   amLODipine (NORVASC) tablet 5 mg, 5 mg, Oral, q morning, Alcario Drought, Jared M, DO, 5 mg at 07/29/21 1033   aspirin EC tablet 81 mg, 81 mg, Oral, q morning, Alcario Drought, Jared M, DO, 81 mg at 07/29/21 1035   enoxaparin (LOVENOX) injection 30 mg, 30 mg, Subcutaneous, Q24H, Gardner, Jared M, DO, 30 mg at 07/29/21 1039   feeding supplement (ENSURE ENLIVE / ENSURE  PLUS) liquid 237 mL, 237 mL, Oral, BID BM, Alcario Drought, Jared M, DO, 237 mL at 07/29/21 0947   lactated ringers infusion, , Intravenous, Continuous, Rai, Ripudeep K, MD, Last Rate: 75 mL/hr at 07/29/21 1102, New Bag at 07/29/21 1102   losartan (COZAAR) tablet 100 mg, 100 mg, Oral, q morning, Alcario Drought, Jared M, DO, 100 mg at 07/29/21 1034   mometasone-formoterol (DULERA) 200-5 MCG/ACT inhaler 2 puff, 2 puff, Inhalation, BID, Etta Quill, DO, 2 puff at 07/29/21 0846   montelukast (SINGULAIR) tablet 10 mg, 10 mg, Oral, QHS, Alcario Drought, Jared M, DO, 10 mg at 07/28/21 2247   multivitamin with minerals tablet 1 tablet, 1 tablet, Oral, Daily, Alcario Drought, Jared M, DO, 1 tablet at 07/29/21 1037   ondansetron (ZOFRAN) tablet 4 mg, 4 mg, Oral, Q6H PRN **OR** ondansetron (ZOFRAN) injection 4 mg, 4 mg, Intravenous, Q6H PRN, Alcario Drought, Jared M, DO   pantoprazole (PROTONIX) EC tablet 40 mg, 40 mg, Oral, Daily, Alcario Drought, Jared M, DO, 40 mg at 07/29/21 1035   predniSONE (DELTASONE) tablet 6 mg, 6 mg, Oral, Q breakfast, Alcario Drought, Jared M, DO, 6 mg at 07/29/21 0737   thiamine $RemoveB'500mg'YyBgDhdS$  in normal saline (42ml) IVPB, 500 mg, Intravenous, Q8H, Rai, Ripudeep K, MD  Exam: Current vital signs: BP (!) 107/58 (BP Location: Right Arm)   Pulse 96   Temp 98.9 F (37.2 C)  (Oral)   Resp (!) 24   Ht 5' (1.524 m)   Wt 70.3 kg   SpO2 100%   BMI 30.27 kg/m  Vital signs in last 24 hours: Temp:  [97.6 F (36.4 C)-99.8 F (37.7 C)] 98.9 F (37.2 C) (10/04 1234) Pulse Rate:  [91-96] 96 (10/04 1234) Resp:  [14-24] 24 (10/04 1234) BP: (107-129)/(57-73) 107/58 (10/04 1234) SpO2:  [94 %-100 %] 100 % (10/04 1234)  GENERAL: Lethargic, laying in hospital bed, appears ill.  Psych: Unable to assess due to lethargy.  Head: Normocephalic and atraumatic, without obvious abnormality EENT: Normal conjunctivae with some mild scleral edema, dry mucous membranes, no OP obstruction Neck: Tender to palpation, limited passive ROM with grimace on manipulation LUNGS: Tachypnea, on room air, some mild wheezing CV: Regular rate on telemetry, 1+ pedal edema L > R ABDOMEN: Soft, non-tender, non-distended Extremities: warm, well perfused, without obvious deformity  NEURO:  Mental Status: Lethargic, opens eyes briefly to loud voice and quickly goes back to sleep. With repeat questioning and stimulation, she is able to state her name correctly and date of birth. She is also able to identify her daughter and state that the family members on the telephone were her grandchildren. She was unable to state her age, the month, year, or place correctly. She incorrectly stated "yes" to being at home.  On later attending evaluation, she is able to state that she is at [facility] when she is asked what hospital she is up She is unable to provide a clear and coherent history of present illness. Speech/Language: speech is not dysarthric but she speaks in brief, one word phrases.    Speech is nonfluent and she does not attempt to name objects or repeat phrases.  No neglect is noted Cranial Nerves:  II: PERRL 4 mm/brisk.  III, IV, VI: She is able to attend to stimuli in lateral visual fields but minimally opens eyes to stimuli and quickly closes them again. She has active resistance to examiner eye  opening.  V: Blinks to threat throughout but with active resistance to examiner eye opening and minimal eye opening  to stimuli.  VII: Face is symmetric resting and grimacing VIII: Hearing is intact to voice with hearing aids IX, X: Phonation is normal XI: Head is grossly midline XII: Does not protrude tongue to command Motor: Does not participate in formal strength evaluation. She withdraws each extremity briskly with light application of noxious stimuli.  She does have some cogwheel rigidity noted in BUE and a fine tremor with intention of BUE.  Tone is normal. Bulk is decreased throughout.  Sensation: Withdraws each extremity briskly and equally with light application to noxious stimuli.   Coordination: Unable to assess, patient does not follow commands.  DTRs: Difficult to assess given paratonia Gait: Deferred  Labs I have reviewed labs in epic and the results pertinent to this consultation are: CBC    Component Value Date/Time   WBC 16.6 (H) 07/29/2021 0519   RBC 2.90 (L) 07/29/2021 0519   HGB 9.3 (L) 07/29/2021 0519   HGB 10.0 (L) 10/25/2018 0946   HGB 10.2 (L) 07/27/2016 0934   HCT 28.8 (L) 07/29/2021 0519   HCT 31.3 (L) 07/27/2016 0934   PLT 188 07/29/2021 0519   PLT 254 10/25/2018 0946   PLT 200 07/27/2016 0934   MCV 99.3 07/29/2021 0519   MCV 96.0 07/27/2016 0934   MCH 32.1 07/29/2021 0519   MCHC 32.3 07/29/2021 0519   RDW 13.9 07/29/2021 0519   RDW 12.6 07/27/2016 0934   LYMPHSABS 0.9 07/27/2021 1640   LYMPHSABS 1.4 07/27/2016 0934   MONOABS 0.8 07/27/2021 1640   MONOABS 0.4 07/27/2016 0934   EOSABS 0.0 07/27/2021 1640   EOSABS 0.2 07/27/2016 0934   BASOSABS 0.0 07/27/2021 1640   BASOSABS 0.1 07/27/2016 0934   CMP     Component Value Date/Time   NA 143 07/29/2021 0519   NA 138 07/27/2016 0935   K 3.4 (L) 07/29/2021 0519   K 4.4 07/27/2016 0935   CL 111 07/29/2021 0519   CO2 25 07/29/2021 0519   CO2 25 07/27/2016 0935   GLUCOSE 137 (H) 07/29/2021 0519    GLUCOSE 136 07/27/2016 0935   BUN 28 (H) 07/29/2021 0519   BUN 24.7 07/27/2016 0935   CREATININE 1.61 (H) 07/29/2021 0519   CREATININE 1.50 (H) 01/24/2021 1632   CREATININE 1.4 (H) 07/27/2016 0935   CALCIUM 9.0 07/29/2021 0519   CALCIUM 9.1 07/27/2016 0935   PROT 5.6 (L) 07/29/2021 0519   PROT 6.9 07/27/2016 0935   ALBUMIN 2.7 (L) 07/29/2021 0519   ALBUMIN 3.4 (L) 07/27/2016 0935   AST 41 07/29/2021 0519   AST 32 10/25/2018 0946   AST 23 07/27/2016 0935   ALT 27 07/29/2021 0519   ALT 27 10/25/2018 0946   ALT 18 07/27/2016 0935   ALKPHOS 52 07/29/2021 0519   ALKPHOS 50 07/27/2016 0935   BILITOT 0.8 07/29/2021 0519   BILITOT 0.4 10/25/2018 0946   BILITOT 0.32 07/27/2016 0935   GFRNONAA 32 (L) 07/29/2021 0519   GFRNONAA 21 (L) 12/25/2020 1548   GFRAA 25 (L) 12/25/2020 9518    Basic Metabolic Panel: Recent Labs  Lab 07/27/21 1640 07/28/21 0005 07/28/21 0426 07/29/21 0519  NA 142  --  143 143  K 3.7  --  3.1* 3.4*  CL 102  --  107 111  CO2 28  --  28 25  GLUCOSE 143*  --  99 137*  BUN 28*  --  22 28*  CREATININE 1.37*  --  1.18* 1.61*  CALCIUM 9.9  --  9.1 9.0  MG  --  1.3*  --   --     CBC: Recent Labs  Lab 07/27/21 1640 07/28/21 0426 07/29/21 0519  WBC 12.6* 12.8* 16.6*  NEUTROABS 10.8*  --   --   HGB 12.0 11.1* 9.3*  HCT 38.2 34.9* 28.8*  MCV 100.8* 101.5* 99.3  PLT 273 234 188    Coagulation Studies: Recent Labs    07/27/21 1640  LABPROT 13.6  INR 1.0      Lipid Panel     Component Value Date/Time   CHOL 219 (H) 01/24/2021 1632   TRIG 134 01/24/2021 1632   HDL 83 01/24/2021 1632   CHOLHDL 2.6 01/24/2021 1632   VLDL 42.8 (H) 08/01/2020 1600   LDLCALC 111 (H) 01/24/2021 1632   LDLDIRECT 109.0 08/01/2020 1600   Lab Results  Component Value Date   CKTOTAL 1,007 (H) 07/28/2021   Lab Results  Component Value Date   VITAMINB12 5,776 (H) 07/28/2021  Ammonia: 37      Component Value Date/Time   CRP 10.1 (H) 07/28/2021 1228   CRP  4.0 (H) 12/27/2017 0058   Imaging I have reviewed the images obtained:  CT-scan of the brain 07/27/2021 personally reviewed by attending MD, agree with radiology: 1. No acute intracranial process. 2. No acute osseous injury in the cervical spine.  MRI examination of the brain 07/28/2021, personally reviewed by attending MD agree with radiology: No acute or reversible finding. Mild age related volume loss. Mild to moderate chronic small-vessel ischemic changes of the cerebral hemispheric white matter.  Assessment: 85 y.o. female who presented from home after daughter found her on the ground, confused, with hallucinations and with persistent waxing and waning mental status since hospitalization with leukocytosis and fever despite acetaminophen administration.  - Examination reveals patient that is lethargic who wakes briefly to loud voice but does not follow commands. She is able to state her name and can identify some family members with significant stimulation but is unable to answer further orientation questions. She does not have any focal weakness identified on examination. She does complain of neck pain with ROM and tenderness to palpation. Daughter endorses that patient has been complaining of a nagging headache recently and she has mentioned neck pain requiring a neck pillow over the last few weeks.  - MRI brain and CT imaging are without acute intracranial abnormality or reversible findings. MRI brain does reveal some age-related volume loss and some chronic small-vessel ischemic changes of the cerebral hemispheric white matter.  - Patient has had reported waxing and waning mental status with lethargy and confusion with intermittent clearing of mental status but has not fully returned to her baseline status during hospitalization. - Work up reveals patient with persistent low grade fever with temperature of 100.5 degrees during NP examination despite acetaminophen administration 2 hours prior  with tachypnea and lethargy. She also has a significant leukocytosis with a WBC count of 16.6K. She is on chronic steroid since 2017 per notes therapy but on presentation, her WBC count was 12.6 and has been normal in the face of this ongoing therapy. She also has anemia with a hemoglobin of 9.3. She also was found to have acute rhabdomyolysis with a total CK elevation to 1200.  Additionally her CRP is elevated from prior to 10.1 now versus 4.0 in 2019 - Due to patients waxing and waning mental status, elevated temperature, lethargy, neck pain and tenderness, and leukocytosis, there is significant concern for an infectious etiology of patient presentation. Her UA and CXR have been  unremarkable with pending blood cultures.  Meningitis must be ruled out given her presentation and lack of clear source  Recommendations: - Trend fever and WBC curve - Will need LP with CSF cell count with differential, protein, glucose, culture and gram stain, HSV PCR, fungus culture, VDRL, agree with biofire panel per ID - Neurology unable to provide LP coverage at [facility] and this needs to be coordinated by primary team with radiology - Initiate empiric meningitis coverage with antibiotics and antiviral medications in the setting of leukocytosis and fever in immunosuppressed patient without clear infectious source - Appreciate pharmacy consult for acyclovir renal dosing - Appreciate ID following - Blood cultures ordered, pending - Management of metabolic derangements per primary team - Continue thiamine supplementation 500 mg IV every 8 hours x3 days followed by 100 mg daily, may be converted to p.o. on discharge - Neurology will continue to follow - Trend CK and magnesium  Pt seen by NP/Neuro and later by MD. Note/plan to be edited by MD as needed.  Anibal Henderson, AGAC-NP [facility] Pager: [phone]  Attending Neurologist's note:  I personally saw this patient, gathering history,  performing a full neurologic examination, reviewing relevant labs, personally reviewing relevant imaging including CT and MRI, and formulated the assessment and plan, adding the note above for completeness and clarity to accurately reflect my thoughts, including recommendation for urgent LP around 3:30 PM.  Additionally I discussed need for lumbar puncture with daughter via phone at approximately 8 PM after stabilizing multiple other critically ill patients.  She was understandably upset that she had been told the LP would be completed by approximately 7 PM and it has not yet been completed. This will need to be followed up tomorrow and prioritized.  Recommendations discussed via phone and secure chat with primary team, nursing, and family as documented above  Lesleigh Noe MD-PhD [facility] [phone] Available 7 AM to 7 PM, outside these hours please contact Neurologist on call listed on AMION

## 2021-07-29 NOTE — Progress Notes (Signed)
Pharmacy Antibiotic Note  [name] is a 85 y.o. female with suspected meningitis meningitis.  Pharmacy has been consulted for ampicillin, vancomycin, ceftriaxone and acyclovir dosing.  Plan: Vancomycin 1500mg  x 1, then 750mg  IV every 24 hours.  Goal trough 15-20 mcg/mL. Ampicillin 2g IV q8h Ceftriaxone 2g IV q12h Acyclovir 700mg  q24h with LR at 48mL/mr    Height: 5' (152.4 cm) Weight: 70.3 kg (155 lb) IBW/kg (Calculated) : 45.5  Temp (24hrs), Avg:99.2 F (37.3 C), Min:97.6 F (36.4 C), Max:100.5 F (38.1 C)  Recent Labs  Lab 07/27/21 1640 07/27/21 1833 07/28/21 0426 07/29/21 0519  WBC 12.6*  --  12.8* 16.6*  CREATININE 1.37*  --  1.18* 1.61*  LATICACIDVEN 2.4* 1.5  --   --     Estimated Creatinine Clearance: 23.2 mL/min (A) (by C-G formula based on SCr of 1.61 mg/dL (H)).      Antimicrobials this admission: Vancomycin 10/2 >> x1, 10/4>> Ceftriaxone  10/2  >> x 1, 10/4>> Ampicillin 10/4>> Acyclovir 10/4>>  Dose adjustments this admission: N/a  Microbiology results: 10/2 BCx:  10/2 UCx: NG final  10/4 meningitis panel>>  Thank you for allowing pharmacy to be a part of this patient's care.  Candie Mile 07/29/2021 4:18 PM

## 2021-07-30 ENCOUNTER — Inpatient Hospital Stay (HOSPITAL_COMMUNITY): Payer: Medicare HMO

## 2021-07-30 DIAGNOSIS — R6520 Severe sepsis without septic shock: Secondary | ICD-10-CM

## 2021-07-30 DIAGNOSIS — N184 Chronic kidney disease, stage 4 (severe): Secondary | ICD-10-CM | POA: Diagnosis not present

## 2021-07-30 DIAGNOSIS — R400 Somnolence: Secondary | ICD-10-CM | POA: Diagnosis not present

## 2021-07-30 DIAGNOSIS — T796XXA Traumatic ischemia of muscle, initial encounter: Secondary | ICD-10-CM | POA: Diagnosis not present

## 2021-07-30 DIAGNOSIS — I5032 Chronic diastolic (congestive) heart failure: Secondary | ICD-10-CM | POA: Diagnosis not present

## 2021-07-30 DIAGNOSIS — G9341 Metabolic encephalopathy: Secondary | ICD-10-CM | POA: Diagnosis not present

## 2021-07-30 DIAGNOSIS — G9340 Encephalopathy, unspecified: Secondary | ICD-10-CM

## 2021-07-30 DIAGNOSIS — A419 Sepsis, unspecified organism: Secondary | ICD-10-CM | POA: Diagnosis not present

## 2021-07-30 LAB — CSF CELL COUNT WITH DIFFERENTIAL
RBC Count, CSF: 335 /mm3 — ABNORMAL HIGH
RBC Count, CSF: 153 /mm3 — ABNORMAL HIGH
Tube #: 1
Tube #: 4
WBC, CSF: 3 /mm3 (ref 0–5)
WBC, CSF: 1 /mm3 (ref 0–5)

## 2021-07-30 LAB — CBC
HCT: 28.5 % — ABNORMAL LOW (ref 36.0–46.0)
Hemoglobin: 9.2 g/dL — ABNORMAL LOW (ref 12.0–15.0)
MCH: 32.6 pg (ref 26.0–34.0)
MCHC: 32.3 g/dL (ref 30.0–36.0)
MCV: 101.1 fL — ABNORMAL HIGH (ref 80.0–100.0)
Platelets: 173 10*3/uL (ref 150–400)
RBC: 2.82 MIL/uL — ABNORMAL LOW (ref 3.87–5.11)
RDW: 14.4 % (ref 11.5–15.5)
WBC: 29.8 10*3/uL — ABNORMAL HIGH (ref 4.0–10.5)
nRBC: 0 % (ref 0.0–0.2)

## 2021-07-30 LAB — PROTEIN AND GLUCOSE, CSF
Glucose, CSF: 76 mg/dL — ABNORMAL HIGH (ref 40–70)
Total  Protein, CSF: 129 mg/dL — ABNORMAL HIGH (ref 15–45)

## 2021-07-30 LAB — BASIC METABOLIC PANEL
Anion gap: 10 (ref 5–15)
BUN: 34 mg/dL — ABNORMAL HIGH (ref 8–23)
CO2: 24 mmol/L (ref 22–32)
Calcium: 8.8 mg/dL — ABNORMAL LOW (ref 8.9–10.3)
Chloride: 107 mmol/L (ref 98–111)
Creatinine, Ser: 1.62 mg/dL — ABNORMAL HIGH (ref 0.44–1.00)
GFR, Estimated: 31 mL/min — ABNORMAL LOW (ref >60)
Glucose, Bld: 163 mg/dL — ABNORMAL HIGH (ref 70–99)
Potassium: 4.4 mmol/L (ref 3.5–5.1)
Sodium: 141 mmol/L (ref 135–145)

## 2021-07-30 LAB — CRYPTOCOCCAL ANTIGEN, CSF: Crypto Ag: NEGATIVE

## 2021-07-30 LAB — CK: Total CK: 523 U/L — ABNORMAL HIGH (ref 38–234)

## 2021-07-30 LAB — HIV ANTIBODY (ROUTINE TESTING W REFLEX): HIV Screen 4th Generation wRfx: NONREACTIVE

## 2021-07-30 LAB — MAGNESIUM: Magnesium: 1.7 mg/dL (ref 1.7–2.4)

## 2021-07-30 MED ORDER — SODIUM CHLORIDE 0.9 % IV SOLN: INTRAVENOUS | Status: DC

## 2021-07-30 MED ORDER — LIDOCAINE HCL (PF) 1 % IJ SOLN
19.0000 mL | Freq: Once | INTRAMUSCULAR | Status: AC
  Administered 2021-07-30: 19 mL via INTRADERMAL

## 2021-07-30 MED ORDER — NAPHAZOLINE-GLYCERIN 0.012-0.25 % OP SOLN
1.0000 [drp] | Freq: Four times a day (QID) | OPHTHALMIC | Status: DC | PRN
  Administered 2021-07-30: 2 [drp] via OPHTHALMIC
  Filled 2021-07-30: qty 15

## 2021-07-30 MED ORDER — LIDOCAINE HCL (PF) 1 % IJ SOLN
INTRAMUSCULAR | Status: AC
  Filled 2021-07-30: qty 5

## 2021-07-30 NOTE — Progress Notes (Addendum)
Spoke w/ Rosezella Rumpf from Lab he stated that patient gram statin results showed WBC but no organisms. I notified Dr. [place] Pink. Will continue to monitor patient.

## 2021-07-30 NOTE — Procedures (Signed)
Fluoro guided LP.   Findings:  Degen changes and scoli yield a very difficult puncture.   ~9cc of CSF collected for ordered labs.  Initially bloody CSF, which cleared by the conclusion of collection.   Complication: none  EBL: none

## 2021-07-30 NOTE — Progress Notes (Signed)
Neurology Progress Note  S: She feels very well. Her family in the room states her mental status is night and day from previous days. There was confusion as to when the LP was going to get done. NP spoke to daughter on the phone because she signed a consent lying in the room without any witness signature. Daughter is extremely upset that a MD has not been in person to see her mother to discuss the LP. Daughter would like NP to see patient first before the LP is done if it could possibly not be done.   O: Current vital signs: BP 135/77 (BP Location: Right Arm)   Pulse 83   Temp 98.1 F (36.7 C) (Oral)   Resp 20   Ht 5' (1.524 m)   Wt 70.3 kg   SpO2 97%   BMI 30.27 kg/m  Vital signs in last 24 hours: Temp:  [98.1 F (36.7 C)-100.5 F (38.1 C)] 98.1 F (36.7 C) (10/05 0455) Pulse Rate:  [72-96] 83 (10/05 0455) Resp:  [20-24] 20 (10/05 0455) BP: (90-135)/(51-77) 135/77 (10/05 0455) SpO2:  [90 %-100 %] 97 % (10/05 0755)  GENERAL: Well appearing elderly female in NAD. Awake, alert in NAD. HEENT: Normocephalic and atraumatic. LUNGS: Normal respiratory effort.  CV: RRR on tele.  Ext: warm. Psych: light affect, smiling, laughing.   NEURO:  Mental Status: Alert and oriented to everything but year and date. Follows commands.  Speech/Language: speech is without aphasia or dysarthria.  Naming, repetition, fluency, and comprehension intact.  Cranial Nerves:  II: PERRL.  III, IV, VI: EOMI. Eyelids elevate symmetrically.  V: Sensation is intact to light touch and symmetrical to face.  VII: Smile is symmetrical.  VIII: hearing intact to voice. IX, X: Palate elevates symmetrically. Phonation is normal.  OI:ZTIWPYKD shrug 5/5. XII: tongue is midline without fasciculations. Motor:  RUE: grip 4      bicep  4      tricep  4 RLE: Unable to perform due to pain to ankles which is chronic. Can lift both LEs off bed about 4 inches.  LUE: grip 4 +    bicep 4   tricep 4 LLE: Unable to perform  due to pain.  Sensation- Intact to light touch bilaterally. Extinction absent to DSS.    Coordination: FTN intact bilaterally. Can not perform HKS due to pain.   DTRs:  RUE:  brachioradialis  2     biceps 2 RLE:  patella  1   LUE:  brachioradialis  2  biceps 2 LLE:  patella 0 Gait- deferred.  Medications  Current Facility-Administered Medications:    0.9 %  sodium chloride infusion, , Intravenous, Continuous, Annita Brod, MD, Last Rate: 100 mL/hr at 07/30/21 0826, New Bag at 07/30/21 0826   acetaminophen (TYLENOL) tablet 650 mg, 650 mg, Oral, Q6H PRN, 650 mg at 07/30/21 0825 **OR** acetaminophen (TYLENOL) suppository 650 mg, 650 mg, Rectal, Q6H PRN, Etta Quill, DO, 650 mg at 07/29/21 1430   acyclovir (ZOVIRAX) 700 mg in dextrose 5 % 100 mL IVPB, 700 mg, Intravenous, Q24H, Vu, Trung T, MD, Stopped at 07/29/21 1826   albuterol (PROVENTIL) (2.5 MG/3ML) 0.083% nebulizer solution 2.5 mg, 2.5 mg, Inhalation, Q6H PRN, Etta Quill, DO, 2.5 mg at 07/29/21 1600   ampicillin (OMNIPEN) 2 g in sodium chloride 0.9 % 100 mL IVPB, 2 g, Intravenous, Q8H, Vu, Trung T, MD, Last Rate: 300 mL/hr at 07/30/21 0652, 2 g at 07/30/21 9833   aspirin EC tablet  81 mg, 81 mg, Oral, q morning, Etta Quill, DO, 81 mg at 07/29/21 1035   cefTRIAXone (ROCEPHIN) 2 g in sodium chloride 0.9 % 100 mL IVPB, 2 g, Intravenous, Q12H, Vu, Trung T, MD, Stopped at 07/29/21 1801   enoxaparin (LOVENOX) injection 30 mg, 30 mg, Subcutaneous, Q24H, Alcario Drought, Jared M, DO, 30 mg at 07/29/21 1039   feeding supplement (ENSURE ENLIVE / ENSURE PLUS) liquid 237 mL, 237 mL, Oral, BID BM, Alcario Drought, Jared M, DO, 237 mL at 07/29/21 1445   mometasone-formoterol (DULERA) 200-5 MCG/ACT inhaler 2 puff, 2 puff, Inhalation, BID, Etta Quill, DO, 2 puff at 07/30/21 0755   montelukast (SINGULAIR) tablet 10 mg, 10 mg, Oral, QHS, Alcario Drought, Jared M, DO, 10 mg at 07/29/21 2328   multivitamin with minerals tablet 1 tablet, 1 tablet, Oral,  Daily, Alcario Drought, Jared M, DO, 1 tablet at 07/29/21 1037   ondansetron (ZOFRAN) tablet 4 mg, 4 mg, Oral, Q6H PRN **OR** ondansetron (ZOFRAN) injection 4 mg, 4 mg, Intravenous, Q6H PRN, Alcario Drought, Jared M, DO   pantoprazole (PROTONIX) EC tablet 40 mg, 40 mg, Oral, Daily, Alcario Drought, Jared M, DO, 40 mg at 07/29/21 1035   predniSONE (DELTASONE) tablet 6 mg, 6 mg, Oral, Q breakfast, Alcario Drought, Jared M, DO, 6 mg at 07/30/21 0825   thiamine 500mg  in normal saline (45ml) IVPB, 500 mg, Intravenous, Q8H, Rai, Ripudeep K, MD, Last Rate: 100 mL/hr at 07/30/21 0653, 500 mg at 07/30/21 0653   vancomycin (VANCOREADY) IVPB 750 mg/150 mL, 750 mg, Intravenous, Q24H, Vu, Trung T, MD  Pertinent Labs CK down to 523 from 1169. Folate and B12 normal. WBCC 29.8 up from 16.6. Blood cultures NTD.   No new Imaging  Assessment: 85 yo female who presented 2 days ago after her daughter found her on the floor. He mental status yesterday was lethargic with not following commands. Today, she is sitting up in bed, talking, smiling and oriented except to month and day or date.  Her WBCC has increased today, but patient is on steroids chronically and has daily dose of Hydrocortisone x 2 days. She is afebrile. Thus far, infectious workup has been negative.   Recommendations/Plan:  -Trend fever and WBCC.  -LP at 1130 today. Spoke to daughter in person and went over risks again and she signed new consent.   -CK is improved.  -Continue empiric Acyclovir, Vancomycin, and Rocephin.  -Appreciate ID.  -Follow blood cultures.   Pt seen by Clance Boll, MSN, APN-BC/Nurse Practitioner/Neuro and later by MD. Note and plan to be edited as needed by MD.  Pager: [phone]

## 2021-07-30 NOTE — [place] FL2 (Signed)
[place] MEDICAID FL2 LEVEL OF CARE SCREENING TOOL     IDENTIFICATION  Patient Name: [name] Birthdate: April 23, 1936 Sex: female Admission Date (Current Location): 07/27/2021  [facility] and [place] Number:  Herbalist and Address:  [facility],  [place], [place]      Provider Number: [id number]  Attending Physician Name and Address:  Annita Brod, MD  Relative Name and Phone Number:  Irys, Nigh (Daughter)   [phone]    Current Level of Care: Hospital Recommended Level of Care: [place] Prior Approval Number:    Date Approved/Denied:   PASRR Number: [id number]  Discharge Plan: SNF    Current Diagnoses: Patient Active Problem List   Diagnosis Date Noted   AMS (altered mental status) 07/29/2021   Sepsis ([place])    Altered mental status 07/27/2021   Chronic diastolic CHF (congestive heart failure) ([place]) 07/27/2021   Right hip pain 06/27/2021   Right knee pain 53/29/9242   Acute diastolic congestive heart failure ([place]) 06/18/2021   Calcium pyrophosphate deposition disease 02/12/2021   Fibromyalgia 02/12/2021   Polymyalgia rheumatica ([place]) 02/12/2021   Inflammatory arthritis 02/12/2021   Left knee pain 02/12/2021   Long term (current) use of systemic steroids 02/01/2021   Nail disorder 01/24/2021   Toe pain, right 01/24/2021   Chondrocalcinosis 12/19/2020   Primary osteoarthritis of both hands 12/19/2020   Pain in joint of left shoulder 08/22/2020   Pain in joint of right shoulder 08/22/2020   Shoulder pain 07/31/2020   Chronic pain 04/23/2020   Supraclavicular fossa fullness 04/23/2020   Finding of neck region 04/23/2020   Low grade fever 12/07/2019   OAB (overactive bladder) 12/07/2019   Pain of right heel 12/07/2019   Overactive bladder 12/07/2019   Diarrhea 04/13/2019   Abnormal gait 08/30/2018   Asthenia 08/30/2018   Conjunctivitis of left eye 04/13/2018   Rash 04/13/2018    Eruption at injection site 04/13/2018   Acute bilateral deep vein thrombosis (DVT) of femoral veins (HCC) 01/31/2018   Hypokalemia 01/31/2018   Electrocardiogram abnormal 01/31/2018   Increased creatine kinase level 01/31/2018   Left leg pain 01/13/2018   Left leg swelling 01/13/2018   Hearing loss 01/13/2018   Intractable pain 12/27/2017   Arachnoiditis 12/27/2017   Pain 12/27/2017   Hyponatremia    S/P lumbar laminectomy 12/23/2017   History of lumbar laminectomy 12/23/2017   Wheezing 12/10/2017   Itching 11/22/2017   Urinary frequency 11/22/2017   Increased frequency of urination 11/22/2017   Degeneration of lumbar intervertebral disc 11/05/2017   Sciatica 09/04/2017   Asthma exacerbation 08/31/2017   Acute asthma 08/31/2017   Flare of rheumatoid arthritis ([place]) 05/11/2017   Articular gout 05/01/2017   Vertigo 10/30/2016   Allergic rhinitis 10/30/2016   Incontinence of feces 07/31/2016   Hemorrhoids 07/31/2016   Hematochezia 07/31/2016   Peripheral venous insufficiency 68/34/1962   Diastolic dysfunction 22/97/9892   Peripheral edema 07/10/2016   Left ankle pain 06/02/2016   Ankle pain 06/02/2016   Neck pain 03/10/2016   Low back pain 03/10/2016   Right cervical radiculopathy 11/13/2015   Bradycardia 03/15/2015   Joint pain 12/18/2014   Fever 11/27/2014   Impaired glucose tolerance 07/18/2014   Right lumbar radiculopathy 07/18/2014   Lumbar radiculopathy 07/18/2014   Encounter for well adult exam with abnormal findings 02/20/2014   Malignant neoplasm of upper-outer quadrant of female breast ([place]) 11/08/2013   Diverticular disease 07/09/2012   Headache(784.0) 06/30/2012   Anemia  06/29/2012   Dyspnea 03/11/2012   Cough 03/11/2012   CKD (chronic kidney disease) stage 4, GFR 15-29 ml/min (HCC) 05/18/2011   Gastroesophageal reflux disease 07/05/2008   Vitamin D deficiency 12/19/2007   Morbid obesity ([place]) 12/16/2007   Essential hypertension 12/16/2007    DEGENERATIVE JOINT DISEASE 12/16/2007   Osteoarthritis 12/16/2007    Orientation RESPIRATION BLADDER Height & Weight     Self, Situation, Place  Normal External catheter Weight: 70.3 kg Height:  5' (152.4 cm)  BEHAVIORAL SYMPTOMS/MOOD NEUROLOGICAL BOWEL NUTRITION STATUS   (none)   Incontinent Diet (see d/c summary)  AMBULATORY STATUS COMMUNICATION OF NEEDS Skin   Extensive Assist Verbally Normal                       Personal Care Assistance Level of Assistance  Bathing, Feeding, Dressing Bathing Assistance: Maximum assistance Feeding assistance: Independent Dressing Assistance: Limited assistance     Functional Limitations Info  Sight, Hearing, Speech Sight Info: Adequate Hearing Info: Adequate Speech Info: Adequate    SPECIAL CARE FACTORS FREQUENCY  PT (By licensed PT), OT (By licensed OT)     PT Frequency: 5X/W OT Frequency: 5X/W            Contractures Contractures Info: Not present    Additional Factors Info  Code Status, Allergies Code Status Info: Full Allergies Info: Hydrocodone, Lasix, Tizanidine, Iron           Current Medications (07/30/2021):  This is the current hospital active medication list Current Facility-Administered Medications  Medication Dose Route Frequency Provider Last Rate Last Admin   0.9 %  sodium chloride infusion   Intravenous Continuous Annita Brod, MD 100 mL/hr at 07/30/21 0826 New Bag at 07/30/21 0826   acetaminophen (TYLENOL) tablet 650 mg  650 mg Oral Q6H PRN Etta Quill, DO   650 mg at 07/30/21 0825   Or   acetaminophen (TYLENOL) suppository 650 mg  650 mg Rectal Q6H PRN Etta Quill, DO   650 mg at 07/29/21 1430   acyclovir (ZOVIRAX) 700 mg in dextrose 5 % 100 mL IVPB  700 mg Intravenous Q24H Vu, Trung T, MD   Stopped at 07/29/21 1826   albuterol (PROVENTIL) (2.5 MG/3ML) 0.083% nebulizer solution 2.5 mg  2.5 mg Inhalation Q6H PRN Etta Quill, DO   2.5 mg at 07/29/21 1600   ampicillin (OMNIPEN) 2 g  in sodium chloride 0.9 % 100 mL IVPB  2 g Intravenous Q8H Vu, Trung T, MD 300 mL/hr at 07/30/21 1338 2 g at 07/30/21 1338   aspirin EC tablet 81 mg  81 mg Oral q morning Etta Quill, DO   81 mg at 07/30/21 1428   cefTRIAXone (ROCEPHIN) 2 g in sodium chloride 0.9 % 100 mL IVPB  2 g Intravenous Q12H Vu, Trung T, MD 200 mL/hr at 07/30/21 1102 2 g at 07/30/21 1102   enoxaparin (LOVENOX) injection 30 mg  30 mg Subcutaneous Q24H Jennette Kettle M, DO   30 mg at 07/30/21 1440   feeding supplement (ENSURE ENLIVE / ENSURE PLUS) liquid 237 mL  237 mL Oral BID BM Etta Quill, DO   237 mL at 07/30/21 1428   mometasone-formoterol (DULERA) 200-5 MCG/ACT inhaler 2 puff  2 puff Inhalation BID Etta Quill, DO   2 puff at 07/30/21 0755   montelukast (SINGULAIR) tablet 10 mg  10 mg Oral QHS Jennette Kettle M, DO   10 mg at 07/29/21 2328  multivitamin with minerals tablet 1 tablet  1 tablet Oral Daily Jennette Kettle M, DO   1 tablet at 07/30/21 1428   naphazoline-glycerin (CLEAR EYES REDNESS) ophth solution 1-2 drop  1-2 drop Both Eyes QID PRN Annita Brod, MD       ondansetron [facility]) tablet 4 mg  4 mg Oral Q6H PRN Etta Quill, DO       Or   ondansetron [facility]) injection 4 mg  4 mg Intravenous Q6H PRN Etta Quill, DO       pantoprazole (PROTONIX) EC tablet 40 mg  40 mg Oral Daily Jennette Kettle M, DO   40 mg at 07/30/21 1428   predniSONE (DELTASONE) tablet 6 mg  6 mg Oral Q breakfast Jennette Kettle M, DO   6 mg at 07/30/21 0825   thiamine 500mg  in normal saline (70ml) IVPB  500 mg Intravenous Q8H Rai, Ripudeep K, MD 100 mL/hr at 07/30/21 1427 500 mg at 07/30/21 1427   vancomycin (VANCOREADY) IVPB 750 mg/150 mL  750 mg Intravenous Q24H Vu, Rockey Situ, MD         Discharge Medications: Please see discharge summary for a list of discharge medications.  Relevant Imaging Results:  Relevant Lab Results:   Additional Information SS#: [id number]  Preston Garabedian [place], [place]

## 2021-07-30 NOTE — Progress Notes (Signed)
PROGRESS NOTE  [name] QQV:[id number] DOB: 1936-10-23 DOA: 07/27/2021 PCP: Biagio Borg, MD  HPI/Recap of past 43 hours: 85 year old female with past medical history of diastolic CHF, hypertension, chronic steroid use and stage III-4 chronic kidney disease presented to the emergency room on 10/2 with several weeks of fatigue and weakness as well as intermittent confusion.  Brought in after a fall.  Patient found to have rhabdomyolysis from her fall.  Memory issues reportedly been going on now for more than 6 months and likely felt to be some underlying dementia.  Patient spiked a fever in the emergency room and had elevated white blood cell count, so infectious work-up also initiated.  Neurology consulted.  Today, patient much more appropriate, much closer to baseline.  Creatinine has worsened since admission although still appropriate for stage III-IV disease.  Lactic acid elevated at 2.3 although procalcitonin level normal.  White blood cell count had increased from 16 up to 29.8.  Seen by infectious disease and on empiric antimicrobials for meningitis although suspicion low for meningoencephalitis.  Patient herself complains of some dry eyes and occasionally jerking lower extremities, following nebulizer treatment  Assessment/Plan: Principal Problem:   Altered mental status/acute metabolic encephalopathy in the setting of likely underlying dementia: Possibly underlying infection.  Noted normal procalcitonin level.  Patient was of much more alert today.  C 12 and MRI negative.  TSH and B12 and folate all unremarkable.  Neurontin and Ultram have been stopped.  Ammonia level normal.  On IV thiamine.  Status post LP which noted some leukocytosis but gram stain negative.  Elevated protein and minimally elevated glucose.  Appreciate infectious disease help.  Continue to monitor for infection.  At this time, sepsis has been ruled out.  We will have physical therapy evaluate. Active  Problems:   Essential hypertension: Initially somewhat hypotensive yesterday although today blood pressure more replete.  Continue to monitor closely.  Hypokalemia: Replaced as needed    CKD (chronic kidney disease) stage 4, GFR 15-29 ml/min ([place]): Worse today although still within normal limits.  Have changed over to normal saline we will recheck in the morning.    Chronic pain   Long term (current) use of systemic steroids: Accounts for some immunosuppression.  Could be contributing factor for leukocytosis    Chronic diastolic CHF (congestive heart failure) ([place]): Initially arrived.  Getting fluids for rhabdomyolysis.  Continue to follow. Dry eyes: Started eyedrops.  Code Status: Full code  Family Communication: Daughter at bedside  Disposition Plan: Continue work-up, awaiting PT and OT evaluation   Consultants: Interventional radiology Neurology Infectious disease  Procedures: Lumbar puncture  Antimicrobials: Zovirax 10/4-present IV Rocephin 10/4-present IV vancomycin 10/4-present IV ampicillin 10/4-present  DVT prophylaxis: Lovenox  Level of care: Telemetry   Objective: Vitals:   07/30/21 0755 07/30/21 1319  BP:  (!) 159/81  Pulse:  96  Resp:    Temp:  98.7 F (37.1 C)  SpO2: 97% 100%    Intake/Output Summary (Last 24 hours) at 07/30/2021 1646 Last data filed at 07/30/2021 0423 Gross per 24 hour  Intake 1175.98 ml  Output --  Net 1175.98 ml   Filed Weights   07/27/21 1626  Weight: 70.3 kg   Body mass index is 30.27 kg/m.  Exam:  General: Alert and oriented x2, no acute distress HEENT: Normocephalic and atraumatic, mucous membranes are slightly dry Cardiovascular: Regular rate and rhythm, S1-S2 Respiratory: Clear to auscultation bilaterally Abdomen: Soft, nontender, nondistended, positive bowel sounds Musculoskeletal: No clubbing or  cyanosis, trace pitting edema Skin: No skin breaks, tears or lesions Psychiatry: Currently appropriate, no  evidence of psychoses Neurology: No focal deficits   Data Reviewed: CBC: Recent Labs  Lab 07/27/21 1640 07/28/21 0426 07/29/21 0519 07/30/21 0515  WBC 12.6* 12.8* 16.6* 29.8*  NEUTROABS 10.8*  --   --   --   HGB 12.0 11.1* 9.3* 9.2*  HCT 38.2 34.9* 28.8* 28.5*  MCV 100.8* 101.5* 99.3 101.1*  PLT 273 234 188 694   Basic Metabolic Panel: Recent Labs  Lab 07/27/21 1640 07/28/21 0005 07/28/21 0426 07/29/21 0519 07/30/21 0515  NA 142  --  143 143 141  K 3.7  --  3.1* 3.4* 4.4  CL 102  --  107 111 107  CO2 28  --  28 25 24   GLUCOSE 143*  --  99 137* 163*  BUN 28*  --  22 28* 34*  CREATININE 1.37*  --  1.18* 1.61* 1.62*  CALCIUM 9.9  --  9.1 9.0 8.8*  MG  --  1.3*  --   --  1.7   GFR: Estimated Creatinine Clearance: 23 mL/min (A) (by C-G formula based on SCr of 1.62 mg/dL (H)). Liver Function Tests: Recent Labs  Lab 07/27/21 1640 07/28/21 0426 07/29/21 0519  AST 57* 56* 41  ALT 34 29 27  ALKPHOS 73 63 52  BILITOT 1.0 0.8 0.8  PROT 7.3 6.2* 5.6*  ALBUMIN 3.8 3.1* 2.7*   No results for input(s): LIPASE, AMYLASE in the last 168 hours. Recent Labs  Lab 07/28/21 0005 07/29/21 1028  AMMONIA 38* 37*   Coagulation Profile: Recent Labs  Lab 07/27/21 1640  INR 1.0   Cardiac Enzymes: Recent Labs  Lab 07/27/21 1640 07/28/21 0426 07/28/21 1228 07/30/21 0515  CKTOTAL 1,200* 1,169* 1,007* 523*   BNP (last 3 results) Recent Labs    06/18/21 1648  PROBNP 56.0   HbA1C: No results for input(s): HGBA1C in the last 72 hours. CBG: Recent Labs  Lab 07/29/21 0915  GLUCAP 137*   Lipid Profile: No results for input(s): CHOL, HDL, LDLCALC, TRIG, CHOLHDL, LDLDIRECT in the last 72 hours. Thyroid Function Tests: Recent Labs    07/28/21 0005  TSH 1.404   Anemia Panel: Recent Labs    07/28/21 1228  VITAMINB12 5,776*  FOLATE 50.0   Urine analysis:    Component Value Date/Time   COLORURINE YELLOW 07/27/2021 2053   APPEARANCEUR CLEAR 07/27/2021 2053    LABSPEC 1.009 07/27/2021 2053   PHURINE 6.0 07/27/2021 2053   GLUCOSEU NEGATIVE 07/27/2021 2053   GLUCOSEU NEGATIVE 08/01/2020 1620   HGBUR MODERATE (A) 07/27/2021 2053   BILIRUBINUR NEGATIVE 07/27/2021 2053   KETONESUR 5 (A) 07/27/2021 2053   PROTEINUR NEGATIVE 07/27/2021 2053   UROBILINOGEN 0.2 08/01/2020 1620   NITRITE NEGATIVE 07/27/2021 2053   LEUKOCYTESUR NEGATIVE 07/27/2021 2053   Sepsis Labs: @LABRCNTIP (procalcitonin:4,lacticidven:4)  ) Recent Results (from the past 240 hour(s))  Blood culture (routine x 2)     Status: None (Preliminary result)   Collection Time: 07/27/21  4:40 PM   Specimen: BLOOD  Result Value Ref Range Status   Specimen Description   Final    BLOOD SITE NOT SPECIFIED Performed at [facility], [place] [address] [place], [place] [address]    Special Requests   Final    BOTTLES DRAWN AEROBIC AND ANAEROBIC Blood Culture adequate volume Performed at [place] [address] [place], [place] [address]    Culture   Final  NO GROWTH 3 DAYS Performed at [facility], [place] [address], [place], [place] [address]    Report Status PENDING  Incomplete  Blood culture (routine x 2)     Status: None (Preliminary result)   Collection Time: 07/27/21  4:40 PM   Specimen: BLOOD RIGHT FOREARM  Result Value Ref Range Status   Specimen Description   Final    BLOOD RIGHT FOREARM Performed at [facility], [place] [address] [place], [place] [address]    Special Requests   Final    BOTTLES DRAWN AEROBIC AND ANAEROBIC Blood Culture results may not be optimal due to an excessive volume of blood received in culture bottles Performed at [place] [address], [place], [place] [address]    Culture   Final    NO GROWTH 3 DAYS Performed at [facility], [place] [address], [place], [place] [address]    Report Status PENDING  Incomplete  Resp Panel by RT-PCR (Flu A&B, Covid) Nasopharyngeal  Swab     Status: None   Collection Time: 07/27/21  4:54 PM   Specimen: Nasopharyngeal Swab; Nasopharyngeal(NP) swabs in vial transport medium  Result Value Ref Range Status   SARS Coronavirus 2 by RT PCR NEGATIVE NEGATIVE Final    Comment: (NOTE) SARS-CoV-2 target nucleic acids are NOT DETECTED.  The SARS-CoV-2 RNA is generally detectable in upper respiratory specimens during the acute phase of infection. The lowest concentration of SARS-CoV-2 viral copies this assay can detect is 138 copies/mL. A negative result does not preclude SARS-Cov-2 infection and should not be used as the sole basis for treatment or other patient management decisions. A negative result may occur with  improper specimen collection/handling, submission of specimen other than nasopharyngeal swab, presence of viral mutation(s) within the areas targeted by this assay, and inadequate number of viral copies(<138 copies/mL). A negative result must be combined with clinical observations, patient history, and epidemiological information. The expected result is Negative.  Fact Sheet for Patients:  EntrepreneurPulse.com.au  Fact Sheet for Healthcare Providers:  IncredibleEmployment.be  This test is no t yet approved or cleared by the [place] FDA and  has been authorized for detection and/or diagnosis of SARS-CoV-2 by FDA under an Emergency Use Authorization (EUA). This EUA will remain  in effect (meaning this test can be used) for the duration of the COVID-19 declaration under Section 564(b)(1) of the Act, 21 U.S.C.section 360bbb-3(b)(1), unless the authorization is terminated  or revoked sooner.       Influenza A by PCR NEGATIVE NEGATIVE Final   Influenza B by PCR NEGATIVE NEGATIVE Final    Comment: (NOTE) The Xpert Xpress SARS-CoV-2/FLU/RSV plus assay is intended as an aid in the diagnosis of influenza from Nasopharyngeal swab specimens and should not be used as a sole  basis for treatment. Nasal washings and aspirates are unacceptable for Xpert Xpress SARS-CoV-2/FLU/RSV testing.  Fact Sheet for Patients: EntrepreneurPulse.com.au  Fact Sheet for Healthcare Providers: IncredibleEmployment.be  This test is not yet approved or cleared by the [place] FDA and has been authorized for detection and/or diagnosis of SARS-CoV-2 by FDA under an Emergency Use Authorization (EUA). This EUA will remain in effect (meaning this test can be used) for the duration of the COVID-19 declaration under Section 564(b)(1) of the Act, 21 U.S.C. section 360bbb-3(b)(1), unless the authorization is terminated or revoked.  Performed at [facility], [place] [address], [place],  [address]   Urine Culture  Status: None   Collection Time: 07/27/21  8:54 PM   Specimen: In/Out Cath Urine  Result Value Ref Range Status   Specimen Description   Final    IN/OUT CATH URINE Performed at [facility], [place] [address], [place], [place] [address]    Special Requests   Final    NONE Performed at [facility], [place] [address], [place], [place] [address]    Culture   Final    NO GROWTH Performed at [facility], [place] [address] [place], [place] [address]    Report Status 07/28/2021 FINAL  Final  Culture, blood (routine x 2)     Status: None (Preliminary result)   Collection Time: 07/29/21  3:37 PM   Specimen: BLOOD LEFT WRIST  Result Value Ref Range Status   Specimen Description   Final    BLOOD LEFT WRIST Performed at [place] [address], [place], [place] [address]    Special Requests   Final    BOTTLES DRAWN AEROBIC ONLY Blood Culture results may not be optimal due to an inadequate volume of blood received in culture bottles Performed at [place] [address], [place], [place] [address]    Culture   Final    NO  GROWTH < 24 HOURS Performed at [place] [address], [place], [place] [address]    Report Status PENDING  Incomplete  Culture, blood (routine x 2)     Status: None (Preliminary result)   Collection Time: 07/29/21  3:38 PM   Specimen: BLOOD  Result Value Ref Range Status   Specimen Description   Final    BLOOD LEFT ANTECUBITAL Performed at [place] [address] [place], [place] [address]    Special Requests   Final    BOTTLES DRAWN AEROBIC ONLY Blood Culture results may not be optimal due to an inadequate volume of blood received in culture bottles Performed at [place] [address], [place], [place] [address]    Culture   Final    NO GROWTH < 24 HOURS Performed at [place] [address] [place], [place] [address]    Report Status PENDING  Incomplete  Respiratory (~20 pathogens) panel by PCR     Status: None   Collection Time: 07/29/21  5:46 PM   Specimen: Nasopharyngeal Swab; Respiratory  Result Value Ref Range Status   Adenovirus NOT DETECTED NOT DETECTED Final   Coronavirus 229E NOT DETECTED NOT DETECTED Final    Comment: (NOTE) The Coronavirus on the Respiratory Panel, DOES NOT test for the novel  Coronavirus (2019 nCoV)    Coronavirus HKU1 NOT DETECTED NOT DETECTED Final   Coronavirus NL63 NOT DETECTED NOT DETECTED Final   Coronavirus OC43 NOT DETECTED NOT DETECTED Final   Metapneumovirus NOT DETECTED NOT DETECTED Final   Rhinovirus / Enterovirus NOT DETECTED NOT DETECTED Final   Influenza A NOT DETECTED NOT DETECTED Final   Influenza B NOT DETECTED NOT DETECTED Final   Parainfluenza Virus 1 NOT DETECTED NOT DETECTED Final   Parainfluenza Virus 2 NOT DETECTED NOT DETECTED Final   Parainfluenza Virus 3 NOT DETECTED NOT DETECTED Final   Parainfluenza Virus 4 NOT DETECTED NOT DETECTED Final   Respiratory Syncytial Virus NOT DETECTED NOT DETECTED Final   Bordetella pertussis NOT DETECTED NOT  DETECTED Final   Bordetella Parapertussis NOT DETECTED NOT DETECTED Final   Chlamydophila pneumoniae NOT DETECTED  NOT DETECTED Final   Mycoplasma pneumoniae NOT DETECTED NOT DETECTED Final    Comment: Performed at [facility], [place] [address], [place], [place] [address]  CSF culture w Gram Stain     Status: None (Preliminary result)   Collection Time: 07/30/21 12:30 PM   Specimen: CSF; Cerebrospinal Fluid  Result Value Ref Range Status   Specimen Description CSF  Final   Special Requests NONE  Final   Gram Stain   Final    WBC PRESENT, PREDOMINANTLY MONONUCLEAR NO ORGANISMS SEEN CYTOSPIN SMEAR Gram Stain Report Called to,Read Back By and Verified With: B.CATES LAND, RN AT 9794 ON 10.05.22 BY N.THOMPSON Performed at [facility], [place] [address] [place],  [address]    Culture PENDING  Incomplete   Report Status PENDING  Incomplete      Studies: DG FLUORO GUIDE LUMBAR PUNCTURE  Result Date: 07/30/2021 CLINICAL DATA:  85 year old female referred for lumbar puncture EXAM: DIAGNOSTIC LUMBAR PUNCTURE UNDER FLUOROSCOPIC GUIDANCE COMPARISON:  MR lumbar 12/27/2017 FLUOROSCOPY TIME:  Fluoroscopy Time:  8 minutes 30 seconds PROCEDURE: Informed consent was obtained from the patient prior to the procedure by Dr. Clovis Riley, including potential complications of headache, allergy, and pain. With the patient prone, the lower back was prepped with Betadine. 1% Lidocaine was used for local anesthesia. Lumbar puncture was attempted at the L2-L3 level using a 20 gauge needle. Dr. Earleen Newport then joined Dr. Clovis Riley for further attempt. Additional site was attempted with further administration of 1% lidocaine at the L3-L4 level. The patient was uncomfortable in this position, and was reposition. This necessitated removal of the needle. Additional 1% lidocaine was then administered for attempt at the L4-L5 level. Again, 20 gauge needle was used for placement under fluoroscopy. Once there  was return of CSF, approximately 9 cc of CSF were obtained for laboratory studies. The patient tolerated the procedure well and there were no apparent complications. IMPRESSION: Fluoro guided LP, successful at L4-L5 as above. Electronically Signed   By: Corrie Mckusick D.O.   On: 07/30/2021 13:41    Scheduled Meds:  aspirin EC  81 mg Oral q morning   enoxaparin (LOVENOX) injection  30 mg Subcutaneous Q24H   feeding supplement  237 mL Oral BID BM   mometasone-formoterol  2 puff Inhalation BID   montelukast  10 mg Oral QHS   multivitamin with minerals  1 tablet Oral Daily   pantoprazole  40 mg Oral Daily   predniSONE  6 mg Oral Q breakfast    Continuous Infusions:  sodium chloride 100 mL/hr at 07/30/21 1516   acyclovir Stopped (07/29/21 1826)   ampicillin (OMNIPEN) IV 2 g (07/30/21 1338)   cefTRIAXone (ROCEPHIN)  IV 2 g (07/30/21 1102)   thiamine injection 500 mg (07/30/21 1427)   vancomycin 750 mg (07/30/21 1615)     LOS: 1 day     Annita Brod, MD [facility]   07/30/2021, 4:46 PM

## 2021-07-30 NOTE — TOC Initial Note (Signed)
Transition of Care [facility]) - Initial/Assessment Note    Patient Details  Name: [name] MRN: [id number] Date of Birth: 07-17-1936  [facility]) CM/SW Contact:    Trish Mage, LCSW Phone Number: 07/30/2021, 2:36 PM  Clinical Narrative:   Patient seen in follow up to PT recommendation of SNF.  Spoke with both [name], who was able to answer all questions herself, and daughter [name] who was bedside.   Felicia Mooradian has been living by herself, uses walker or cane for ambulation, recognizes her weakened condition and is willing to go to short term rehab to work her way back to baseline.  Daughter [name] is planning on moving in with her mother to help care for her post rehab stay.  Bed search process explained and initiated. TOC will continue to follow during the course of hospitalization.              Expected Discharge Plan: Skilled Nursing Facility Barriers to Discharge: SNF Pending bed offer   Patient Goals and CMS Choice     Choice offered to / list presented to : Patient, Adult Children  Expected Discharge Plan and Services Expected Discharge Plan: [place]   Discharge Planning Services: CM Consult Post Acute Care Choice: [place] Living arrangements for the past 2 months: Single Family Home                                      Prior Living Arrangements/Services Living arrangements for the past 2 months: Single Family Home Lives with:: Self Patient language and need for interpreter reviewed:: Yes        Need for Family Participation in Patient Care: Yes (Comment) Care giver support system in place?: Yes (comment) Current home services: DME Criminal Activity/Legal Involvement Pertinent to Current Situation/Hospitalization: No - Comment as needed  Activities of Daily Living Home Assistive Devices/Equipment: Reacher, Long-handled sponge, Shower chair with back, Bedside commode/3-in-1, Cane (specify quad or  straight), Walker (specify type) (single point cane, front wheeled walker) ADL Screening (condition at time of admission) Patient's cognitive ability adequate to safely complete daily activities?: No (patient with ams) Is the patient deaf or have difficulty hearing?: No Does the patient have difficulty seeing, even when wearing glasses/contacts?: No Does the patient have difficulty concentrating, remembering, or making decisions?: Yes Patient able to express need for assistance with ADLs?: Yes Does the patient have difficulty dressing or bathing?: Yes Independently performs ADLs?: No Communication: Independent Dressing (OT): Needs assistance Is this a change from baseline?: Change from baseline, expected to last >3 days Grooming: Needs assistance Is this a change from baseline?: Change from baseline, expected to last >3 days Feeding: Needs assistance Is this a change from baseline?: Change from baseline, expected to last >3 days Bathing: Needs assistance Is this a change from baseline?: Change from baseline, expected to last >3 days Toileting: Needs assistance Is this a change from baseline?: Change from baseline, expected to last >3days In/Out Bed: Needs assistance Is this a change from baseline?: Change from baseline, expected to last >3 days Walks in Home: Needs assistance Is this a change from baseline?: Change from baseline, expected to last >3 days Does the patient have difficulty walking or climbing stairs?: Yes (secondary to weakness) Weakness of Legs: Both Weakness of Arms/Hands: None  Permission Sought/Granted Permission sought to share information with : Family Supports Permission granted to share  information with : Yes, Verbal Permission Granted  Share Information with NAME: [place], [place] (Daughter)   [phone]           Emotional Assessment Appearance:: Appears stated age Attitude/Demeanor/Rapport: Engaged Affect (typically observed): Appropriate Orientation:  : Oriented to Self, Oriented to Place, Oriented to Situation Alcohol / Substance Use: Not Applicable Psych Involvement: No (comment)  Admission diagnosis:  Altered mental status [R41.82] Elevated troponin [R77.8] Fall, initial encounter [W19.XXXA] Traumatic rhabdomyolysis, initial encounter ([place]) [T79.6XXA] Altered mental status, unspecified altered mental status type [R41.82] AMS (altered mental status) [R41.82] Patient Active Problem List   Diagnosis Date Noted   AMS (altered mental status) 07/29/2021   Sepsis ([place])    Altered mental status 07/27/2021   Chronic diastolic CHF (congestive heart failure) ([place]) 07/27/2021   Right hip pain 06/27/2021   Right knee pain 63/87/5643   Acute diastolic congestive heart failure ([place]) 06/18/2021   Calcium pyrophosphate deposition disease 02/12/2021   Fibromyalgia 02/12/2021   Polymyalgia rheumatica ([place]) 02/12/2021   Inflammatory arthritis 02/12/2021   Left knee pain 02/12/2021   Long term (current) use of systemic steroids 02/01/2021   Nail disorder 01/24/2021   Toe pain, right 01/24/2021   Chondrocalcinosis 12/19/2020   Primary osteoarthritis of both hands 12/19/2020   Pain in joint of left shoulder 08/22/2020   Pain in joint of right shoulder 08/22/2020   Shoulder pain 07/31/2020   Chronic pain 04/23/2020   Supraclavicular fossa fullness 04/23/2020   Finding of neck region 04/23/2020   Low grade fever 12/07/2019   OAB (overactive bladder) 12/07/2019   Pain of right heel 12/07/2019   Overactive bladder 12/07/2019   Diarrhea 04/13/2019   Abnormal gait 08/30/2018   Asthenia 08/30/2018   Conjunctivitis of left eye 04/13/2018   Rash 04/13/2018   Eruption at injection site 04/13/2018   Acute bilateral deep vein thrombosis (DVT) of femoral veins (HCC) 01/31/2018   Hypokalemia 01/31/2018   Electrocardiogram abnormal 01/31/2018   Increased creatine kinase level 01/31/2018   Left leg pain 01/13/2018   Left leg swelling 01/13/2018    Hearing loss 01/13/2018   Intractable pain 12/27/2017   Arachnoiditis 12/27/2017   Pain 12/27/2017   Hyponatremia    S/P lumbar laminectomy 12/23/2017   History of lumbar laminectomy 12/23/2017   Wheezing 12/10/2017   Itching 11/22/2017   Urinary frequency 11/22/2017   Increased frequency of urination 11/22/2017   Degeneration of lumbar intervertebral disc 11/05/2017   Sciatica 09/04/2017   Asthma exacerbation 08/31/2017   Acute asthma 08/31/2017   Flare of rheumatoid arthritis ([place]) 05/11/2017   Articular gout 05/01/2017   Vertigo 10/30/2016   Allergic rhinitis 10/30/2016   Incontinence of feces 07/31/2016   Hemorrhoids 07/31/2016   Hematochezia 07/31/2016   Peripheral venous insufficiency 32/95/1884   Diastolic dysfunction 16/60/6301   Peripheral edema 07/10/2016   Left ankle pain 06/02/2016   Ankle pain 06/02/2016   Neck pain 03/10/2016   Low back pain 03/10/2016   Right cervical radiculopathy 11/13/2015   Bradycardia 03/15/2015   Joint pain 12/18/2014   Fever 11/27/2014   Impaired glucose tolerance 07/18/2014   Right lumbar radiculopathy 07/18/2014   Lumbar radiculopathy 07/18/2014   Encounter for well adult exam with abnormal findings 02/20/2014   Malignant neoplasm of upper-outer quadrant of female breast ([place]) 11/08/2013   Diverticular disease 07/09/2012   Headache(784.0) 06/30/2012   Anemia 06/29/2012   Dyspnea 03/11/2012   Cough 03/11/2012   CKD (chronic kidney disease) stage 4, GFR 15-29 ml/min ([place]) 05/18/2011  Gastroesophageal reflux disease 07/05/2008   Vitamin D deficiency 12/19/2007   Morbid obesity ([place]) 12/16/2007   Essential hypertension 12/16/2007   DEGENERATIVE JOINT DISEASE 12/16/2007   Osteoarthritis 12/16/2007   PCP:  Biagio Borg, MD Pharmacy:   [id number] - [place], [place] AT [address] [place]. [place] [address] Phone: [phone] Fax: [phone]     Social  Determinants of Health (SDOH) Interventions    Readmission Risk Interventions No flowsheet data found.

## 2021-07-30 NOTE — Progress Notes (Signed)
[facility] for Infectious Disease  Date of Admission:  07/27/2021     CC: ams/sepsis                                       Lines:  Peripheral iv's   Abx: 10/04-c acyclovir iv 10/04-c amp 10/04-c vanc 10/04-c ceftriaxone                                                        Assessment: Acute on chronic encephalopathy Sepsis   85 yo female very functional baseline, recently dxed inflammatory arthritis on chronic prednisone (highest dose 10 mg but down to 6 mg the last several weeks), concern by family for a year progression of s/s of dementia, worsening mentation/delirium the past 1-2 weeks prior to admission, course with sepsis/fever on HD#2 concerning for meningitis   Difficult to say if metabolic encephalopathy from nonspecific febrile syndrome or primary cns process (less likely)   Bcx/ucx so far negative Cxr clear No s/s localizing otherwise outside cns No new medications such as ssri/neuroleptics or any for that matter   Brain mri without classic HSV changes.   For now agree LP for further w/u. Empiric abx including acyclovir. Will adjust/deescalate abx with result   Of note, prednisone dose rather low, but with extreme of age, will also send for fungal process such as crypto (family reports patient with headache complaint of 2-3 weeks and vertigo that may be longer in duration). It is a rather low endemicity area for histo/blasto here and no prodrome story of such so will defer that testing   --------------- 10/05 assessment Patient's mentation is much better today. Said she is hungry LP pending for today morning Tmax 100.1 last 24 hours but fever curve seems to be improving  Wbc up in setting of stress dose hydrocortisone. No eosinophilia. Remains on her home dose prednisone. I still do not see obvious sign of adrenal crisis which certainly can present like this  Low suspicion for hsv/bacterial-fungal-tb meningoencephalitis. Suspect metabolic  encephalopathy due to nonspecific viral process. Will see what LP shows   Hiv/respiratory viral pcr negative. Serum crypto Ag negative  Plan:  Continue meningitis empiric antimicrobials Will f/u LP and adjust abx as needed. Await lp.  Discussed with primary team  I spent more than 35 minute reviewing data/chart, and coordinating care and >50% direct face to face time providing counseling/discussing diagnostics/treatment plan with patient   Principal Problem:   Altered mental status Active Problems:   Essential hypertension   CKD (chronic kidney disease) stage 4, GFR 15-29 ml/min (HCC)   Chronic pain   Long term (current) use of systemic steroids   Chronic diastolic CHF (congestive heart failure) (HCC)   AMS (altered mental status)   Sepsis (HCC)   Allergies  Allergen Reactions   Hydrocodone Itching   Lasix [Furosemide] Other (See Comments)    Dizziness.    Tizanidine Other (See Comments)    Dizzy and fall   Iron Hives and Other (See Comments)     bad constipation     Scheduled Meds:  aspirin EC  81 mg Oral q morning   enoxaparin (LOVENOX) injection  30 mg Subcutaneous Q24H  feeding supplement  237 mL Oral BID BM   lidocaine (PF)       mometasone-formoterol  2 puff Inhalation BID   montelukast  10 mg Oral QHS   multivitamin with minerals  1 tablet Oral Daily   pantoprazole  40 mg Oral Daily   predniSONE  6 mg Oral Q breakfast   Continuous Infusions:  sodium chloride 100 mL/hr at 07/30/21 0826   acyclovir Stopped (07/29/21 1826)   ampicillin (OMNIPEN) IV 2 g (07/30/21 9371)   cefTRIAXone (ROCEPHIN)  IV 2 g (07/30/21 1102)   thiamine injection 500 mg (07/30/21 0653)   vancomycin     PRN Meds:.acetaminophen **OR** acetaminophen, albuterol, naphazoline-glycerin, ondansetron **OR** ondansetron (ZOFRAN) IV   SUBJECTIVE: Patient feels more alert today She is hungry Waiting for lp Fever curve improved Wbc up (hydrocortisone given yesterday and stopped  today) No n/v/diarrhea/rash  No cough/chest pain  Respiratory viral pcr negative  Review of Systems: ROS All other ROS was negative, except mentioned above     OBJECTIVE: Vitals:   07/29/21 1948 07/29/21 1951 07/30/21 0455 07/30/21 0755  BP: (!) 90/51  135/77   Pulse: 72  83   Resp: 20  20   Temp: 98.6 F (37 C)  98.1 F (36.7 C)   TempSrc: Oral  Oral   SpO2: 90% 90% 99% 97%  Weight:      Height:       Body mass index is 30.27 kg/m.  Physical Exam General/constitutional: no distress, pleasant, verbal/appropriately conversant, cooperative HEENT: Normocephalic, PER, Conj Clear, EOMI, Oropharynx clear Neck supple CV: rrr no mrg Lungs: clear to auscultation, normal respiratory effort Abd: Soft, Nontender Ext: no edema Skin: No Rash Neuro: generalized weakness; nonfocal MSK: no peripheral joint swelling/tenderness/warmth; back spines nontender   Central line presence: no   Lab Results Lab Results  Component Value Date   WBC 29.8 (H) 07/30/2021   HGB 9.2 (L) 07/30/2021   HCT 28.5 (L) 07/30/2021   MCV 101.1 (H) 07/30/2021   PLT 173 07/30/2021    Lab Results  Component Value Date   CREATININE 1.62 (H) 07/30/2021   BUN 34 (H) 07/30/2021   NA 141 07/30/2021   K 4.4 07/30/2021   CL 107 07/30/2021   CO2 24 07/30/2021    Lab Results  Component Value Date   ALT 27 07/29/2021   AST 41 07/29/2021   ALKPHOS 52 07/29/2021   BILITOT 0.8 07/29/2021      Microbiology: Recent Results (from the past 240 hour(s))  Blood culture (routine x 2)     Status: None (Preliminary result)   Collection Time: 07/27/21  4:40 PM   Specimen: BLOOD  Result Value Ref Range Status   Specimen Description   Final    BLOOD SITE NOT SPECIFIED Performed at [facility] [address], [place], [place] [address]    Special Requests   Final    BOTTLES DRAWN AEROBIC AND ANAEROBIC Blood Culture adequate volume Performed at [facility]  [address], [place], [place] [address]    Culture   Final    NO GROWTH 3 DAYS Performed at [facility], [place] [address] [place], [place] [address]    Report Status PENDING  Incomplete  Blood culture (routine x 2)     Status: None (Preliminary result)   Collection Time: 07/27/21  4:40 PM   Specimen: BLOOD RIGHT FOREARM  Result Value Ref Range Status   Specimen Description   Final  BLOOD RIGHT FOREARM Performed at [facility], [place] [address], [place], [place] [address]    Special Requests   Final    BOTTLES DRAWN AEROBIC AND ANAEROBIC Blood Culture results may not be optimal due to an excessive volume of blood received in culture bottles Performed at [facility] [address], [place], [place] [address]    Culture   Final    NO GROWTH 3 DAYS Performed at [facility], [place] [address], [place], [place] [address]    Report Status PENDING  Incomplete  Resp Panel by RT-PCR (Flu A&B, Covid) Nasopharyngeal Swab     Status: None   Collection Time: 07/27/21  4:54 PM   Specimen: Nasopharyngeal Swab; Nasopharyngeal(NP) swabs in vial transport medium  Result Value Ref Range Status   SARS Coronavirus 2 by RT PCR NEGATIVE NEGATIVE Final    Comment: (NOTE) SARS-CoV-2 target nucleic acids are NOT DETECTED.  The SARS-CoV-2 RNA is generally detectable in upper respiratory specimens during the acute phase of infection. The lowest concentration of SARS-CoV-2 viral copies this assay can detect is 138 copies/mL. A negative result does not preclude SARS-Cov-2 infection and should not be used as the sole basis for treatment or other patient management decisions. A negative result may occur with  improper specimen collection/handling, submission of specimen other than nasopharyngeal swab, presence of viral mutation(s) within the areas targeted by this assay, and inadequate number of viral copies(<138 copies/mL). A negative result must be combined with clinical  observations, patient history, and epidemiological information. The expected result is Negative.  Fact Sheet for Patients:  EntrepreneurPulse.com.au  Fact Sheet for Healthcare Providers:  IncredibleEmployment.be  This test is no t yet approved or cleared by the [place] FDA and  has been authorized for detection and/or diagnosis of SARS-CoV-2 by FDA under an Emergency Use Authorization (EUA). This EUA will remain  in effect (meaning this test can be used) for the duration of the COVID-19 declaration under Section 564(b)(1) of the Act, 21 U.S.C.section 360bbb-3(b)(1), unless the authorization is terminated  or revoked sooner.       Influenza A by PCR NEGATIVE NEGATIVE Final   Influenza B by PCR NEGATIVE NEGATIVE Final    Comment: (NOTE) The Xpert Xpress SARS-CoV-2/FLU/RSV plus assay is intended as an aid in the diagnosis of influenza from Nasopharyngeal swab specimens and should not be used as a sole basis for treatment. Nasal washings and aspirates are unacceptable for Xpert Xpress SARS-CoV-2/FLU/RSV testing.  Fact Sheet for Patients: EntrepreneurPulse.com.au  Fact Sheet for Healthcare Providers: IncredibleEmployment.be  This test is not yet approved or cleared by the [place] FDA and has been authorized for detection and/or diagnosis of SARS-CoV-2 by FDA under an Emergency Use Authorization (EUA). This EUA will remain in effect (meaning this test can be used) for the duration of the COVID-19 declaration under Section 564(b)(1) of the Act, 21 U.S.C. section 360bbb-3(b)(1), unless the authorization is terminated or revoked.  Performed at [facility], [place] [address] [place], [place] [address]   Urine Culture     Status: None   Collection Time: 07/27/21  8:54 PM   Specimen: In/Out Cath Urine  Result Value Ref Range Status   Specimen Description   Final    IN/OUT CATH  URINE Performed at [facility] [address], [place], [place] [address]    Special Requests   Final    NONE Performed at [facility], [place]  [address] [place], [place] [address]    Culture   Final    NO GROWTH Performed at [facility], [place] [address], [place], [place] [address]    Report Status 07/28/2021 FINAL  Final  Culture, blood (routine x 2)     Status: None (Preliminary result)   Collection Time: 07/29/21  3:37 PM   Specimen: BLOOD LEFT WRIST  Result Value Ref Range Status   Specimen Description   Final    BLOOD LEFT WRIST Performed at [facility] [address], [place], [place] [address]    Special Requests   Final    BOTTLES DRAWN AEROBIC ONLY Blood Culture results may not be optimal due to an inadequate volume of blood received in culture bottles Performed at [facility] [address], [place], [place] [address]    Culture   Final    NO GROWTH < 24 HOURS Performed at [facility] [address], [place], [place] [address]    Report Status PENDING  Incomplete  Culture, blood (routine x 2)     Status: None (Preliminary result)   Collection Time: 07/29/21  3:38 PM   Specimen: BLOOD  Result Value Ref Range Status   Specimen Description   Final    BLOOD LEFT ANTECUBITAL Performed at [facility] [address], [place], [place] [address]    Special Requests   Final    BOTTLES DRAWN AEROBIC ONLY Blood Culture results may not be optimal due to an inadequate volume of blood received in culture bottles Performed at [facility] [address], [place], [place] [address]    Culture   Final    NO GROWTH < 24 HOURS Performed at [facility] [address] [place], [place] [address]    Report Status PENDING  Incomplete  Respiratory (~20 pathogens) panel by PCR     Status: None   Collection Time: 07/29/21  5:46 PM   Specimen:  Nasopharyngeal Swab; Respiratory  Result Value Ref Range Status   Adenovirus NOT DETECTED NOT DETECTED Final   Coronavirus 229E NOT DETECTED NOT DETECTED Final    Comment: (NOTE) The Coronavirus on the Respiratory Panel, DOES NOT test for the novel  Coronavirus (2019 nCoV)    Coronavirus HKU1 NOT DETECTED NOT DETECTED Final   Coronavirus NL63 NOT DETECTED NOT DETECTED Final   Coronavirus OC43 NOT DETECTED NOT DETECTED Final   Metapneumovirus NOT DETECTED NOT DETECTED Final   Rhinovirus / Enterovirus NOT DETECTED NOT DETECTED Final   Influenza A NOT DETECTED NOT DETECTED Final   Influenza B NOT DETECTED NOT DETECTED Final   Parainfluenza Virus 1 NOT DETECTED NOT DETECTED Final   Parainfluenza Virus 2 NOT DETECTED NOT DETECTED Final   Parainfluenza Virus 3 NOT DETECTED NOT DETECTED Final   Parainfluenza Virus 4 NOT DETECTED NOT DETECTED Final   Respiratory Syncytial Virus NOT DETECTED NOT DETECTED Final   Bordetella pertussis NOT DETECTED NOT DETECTED Final   Bordetella Parapertussis NOT DETECTED NOT DETECTED Final   Chlamydophila pneumoniae NOT DETECTED NOT DETECTED Final   Mycoplasma pneumoniae NOT DETECTED NOT DETECTED Final    Comment: Performed at [facility], [place]. [address] [place],  [address]     Serology:   Imaging: If present, new imagings (plain films, ct scans, and mri) have been personally visualized and interpreted; radiology reports have been reviewed. Decision making incorporated into the Impression / Recommendations.  07/28/2021 mri  brain no contrast No acute or reversible finding. Mild age related volume loss. Mild to moderate chronic small-vessel ischemic changes of the cerebral hemispheric white matter.   10/02 ct cervical spine No acute osseous process   10/02 cxr No active acute cardiopulm process Jabier Mutton, [place] for [facility] [phone] pager    07/30/2021, 12:57 PM

## 2021-07-30 NOTE — Progress Notes (Signed)
OT Cancellation Note  Patient Details Name: [name] MRN: [id number] DOB: Apr 01, 1936   Cancelled Treatment:    Reason Eval/Treat Not Completed: Patient at procedure or test/ unavailable Patient getting lumbar puncture at this time. Will check back tomorrow.   Jackelyn Poling OTR/L, [facility] Acute Rehabilitation Department Office# [phone] Pager# [phone]  07/30/2021, 1:37 PM

## 2021-07-31 DIAGNOSIS — R400 Somnolence: Secondary | ICD-10-CM | POA: Diagnosis not present

## 2021-07-31 DIAGNOSIS — G9341 Metabolic encephalopathy: Secondary | ICD-10-CM | POA: Diagnosis not present

## 2021-07-31 DIAGNOSIS — A419 Sepsis, unspecified organism: Secondary | ICD-10-CM | POA: Diagnosis present

## 2021-07-31 DIAGNOSIS — R6520 Severe sepsis without septic shock: Secondary | ICD-10-CM | POA: Diagnosis not present

## 2021-07-31 DIAGNOSIS — G9340 Encephalopathy, unspecified: Secondary | ICD-10-CM | POA: Diagnosis not present

## 2021-07-31 LAB — COMPREHENSIVE METABOLIC PANEL
ALT: 34 U/L (ref 0–44)
AST: 35 U/L (ref 15–41)
Albumin: 2.6 g/dL — ABNORMAL LOW (ref 3.5–5.0)
Alkaline Phosphatase: 67 U/L (ref 38–126)
Anion gap: 10 (ref 5–15)
BUN: 24 mg/dL — ABNORMAL HIGH (ref 8–23)
CO2: 22 mmol/L (ref 22–32)
Calcium: 8.4 mg/dL — ABNORMAL LOW (ref 8.9–10.3)
Chloride: 106 mmol/L (ref 98–111)
Creatinine, Ser: 1.25 mg/dL — ABNORMAL HIGH (ref 0.44–1.00)
GFR, Estimated: 43 mL/min — ABNORMAL LOW (ref >60)
Glucose, Bld: 108 mg/dL — ABNORMAL HIGH (ref 70–99)
Potassium: 3.2 mmol/L — ABNORMAL LOW (ref 3.5–5.1)
Sodium: 138 mmol/L (ref 135–145)
Total Bilirubin: 0.5 mg/dL (ref 0.3–1.2)
Total Protein: 5.8 g/dL — ABNORMAL LOW (ref 6.5–8.1)

## 2021-07-31 LAB — BRAIN NATRIURETIC PEPTIDE: B Natriuretic Peptide: 503 pg/mL — ABNORMAL HIGH (ref 0.0–100.0)

## 2021-07-31 LAB — CBC
HCT: 29.1 % — ABNORMAL LOW (ref 36.0–46.0)
Hemoglobin: 9.2 g/dL — ABNORMAL LOW (ref 12.0–15.0)
MCH: 31.8 pg (ref 26.0–34.0)
MCHC: 31.6 g/dL (ref 30.0–36.0)
MCV: 100.7 fL — ABNORMAL HIGH (ref 80.0–100.0)
Platelets: 188 10*3/uL (ref 150–400)
RBC: 2.89 MIL/uL — ABNORMAL LOW (ref 3.87–5.11)
RDW: 14.4 % (ref 11.5–15.5)
WBC: 21 10*3/uL — ABNORMAL HIGH (ref 4.0–10.5)
nRBC: 0 % (ref 0.0–0.2)

## 2021-07-31 LAB — MAGNESIUM: Magnesium: 1.6 mg/dL — ABNORMAL LOW (ref 1.7–2.4)

## 2021-07-31 LAB — VITAMIN B1: Vitamin B1 (Thiamine): 180.9 nmol/L (ref 66.5–200.0)

## 2021-07-31 LAB — VDRL, CSF: VDRL Quant, CSF: NONREACTIVE

## 2021-07-31 MED ORDER — DEXTROSE 5 % IV SOLN
700.0000 mg | Freq: Two times a day (BID) | INTRAVENOUS | Status: DC
  Filled 2021-07-31: qty 14

## 2021-07-31 MED ORDER — POTASSIUM CHLORIDE CRYS ER 20 MEQ PO TBCR
40.0000 meq | EXTENDED_RELEASE_TABLET | Freq: Once | ORAL | Status: AC
  Administered 2021-07-31: 40 meq via ORAL
  Filled 2021-07-31: qty 2

## 2021-07-31 MED ORDER — MAGNESIUM SULFATE 2 GM/50ML IV SOLN
2.0000 g | Freq: Once | INTRAVENOUS | Status: AC
  Administered 2021-07-31: 2 g via INTRAVENOUS
  Filled 2021-07-31: qty 50

## 2021-07-31 NOTE — Progress Notes (Signed)
Occupational Therapy Evaluation Patient Details Name: [name] MRN: [id number] DOB: 1936/09/23 Today's Date: 07/31/2021   History of Present Illness 85 y.o. female presented to the ED on 10/2 after syncopal episode and fall. CT clear for acute injury. Patient admitted for acute metabolic encephalopathy, acute rhabdomyolysis, fall. PMH significant for CHF, HTN, and CKD stage 3-4.   Clinical Impression   PTA, pt was living alone and independent in ADLs and IADLs with use of an assistive device. Pt is currently requiring Min A for Upper body ADLs,Total-Max A for LB ADLs, Mod A +2 for functional mobility. Pt with decreased functional strength, activity tolerance, and balance limiting ability to complete ADLs safely and independently. Due to PLOF and need for short-term rehab post d/c, recommending SNF rehab. Pt would benefit from continued OT in the acute setting to address performance problems and increase occupational performance.     Recommendations for follow up therapy are one component of a multi-disciplinary discharge planning process, led by the attending physician.  Recommendations may be updated based on patient status, additional functional criteria and insurance authorization.   Follow Up Recommendations  SNF    Equipment Recommendations     None recommended.  Recommendations for Other Services       Precautions / Restrictions Precautions Precautions: Fall;Other (comment) (droplet) Restrictions Weight Bearing Restrictions: No      Mobility Bed Mobility Overal bed mobility: Needs Assistance Bed Mobility: Supine to Sit     Supine to sit: Mod assist;HOB elevated     General bed mobility comments: Needing assist to bring LEs off bed and to push up into sitting.    Transfers Overall transfer level: Needs assistance Equipment used: Rolling walker (2 wheeled) Transfers: Stand Pivot Transfers   Stand pivot transfers: Mod assist;+2 physical assistance;+2  safety/equipment       General transfer comment: Pt needing min A for power up, pivot, and guiding down into sitting on BSC.    Balance Overall balance assessment: Needs assistance Sitting-balance support: Feet supported;Single extremity supported Sitting balance-Leahy Scale: Fair Sitting balance - Comments: Poor progressing to fair sitting EOB Postural control: Left lateral lean Standing balance support: Bilateral upper extremity supported Standing balance-Leahy Scale: Poor                             ADL either performed or assessed with clinical judgement   ADL Overall ADL's : Needs assistance/impaired Eating/Feeding: Set up;Sitting   Grooming: Min A;Sitting   Upper Body Bathing: Sitting;Minimal assistance   Lower Body Bathing: Maximal assistance;Sit to/from stand   Upper Body Dressing : Sitting;Minimal assistance   Lower Body Dressing: Sit to/from stand;Total assistance Lower Body Dressing Details (indicate cue type and reason): Required total A to donn/doff socks due to decreased balance in sitting. Toilet Transfer: Stand-pivot;+2 for physical assistance;Mod A assistance;BSC Toilet Transfer Details (indicate cue type and reason): Min A for power up and maintaining balance while pivoting over to [facility] Toileting- Clothing Manipulation and Hygiene: Maximal assistance;Sit to/from stand;Sitting/lateral lean Toileting - Clothing Manipulation Details (indicate cue type and reason): pt performed peri care in front, needing assist for perianal care and maintaining balance during sit to stand     Functional mobility during ADLs: Rolling walker;Mod assistance General ADL Comments: Pt with decreased activity tolerance, functional strength, and balance.     Vision Patient Visual Report: No change from baseline       Perception     Praxis  Pertinent Vitals/Pain Pain Assessment: Faces Faces Pain Scale: Hurts a little bit Pain Descriptors / Indicators:  Grimacing;Tender Pain Intervention(s): Monitored during session     Hand Dominance     Extremity/Trunk Assessment Upper Extremity Assessment Upper Extremity Assessment: LUE deficits/detail;RUE deficits/detail RUE Deficits / Details: 2+/5 ROM shoulder flexion, 3+ grip strength LUE Deficits / Details: 2+/5 ROM shoulder flexion, 3+ grip strength   Lower Extremity Assessment Lower Extremity Assessment: Defer to PT evaluation       Communication Communication Communication: No difficulties   Cognition Arousal/Alertness: Awake/alert Behavior During Therapy: WFL for tasks assessed/performed Overall Cognitive Status: Within Functional Limits for tasks assessed                                 General Comments: Pt A&O x4 and able to answer 3/3 long term memory questions about current events, however tangenital throughout session during conversation, needing min verbal cues to redirect to questions about home.   General Comments  Friend present during session.    Exercises     Shoulder Instructions      Home Living Family/patient expects to be discharged to:: Private residence Living Arrangements: Alone Available Help at Discharge: Family (Reports daughter may live with her post d/c) Type of Home: House Home Access: Stairs to enter [employer] of Steps: 1 Entrance Stairs-Rails: None Home Layout: One level     Bathroom Shower/Tub: Teacher, early years/pre: Handicapped height     Home Equipment: Environmental consultant - 2 wheels;Bedside commode   Additional Comments: Pt reports she is in the process of getting a tub transfer bench through insurance      Prior Functioning/Environment Level of Independence: Needs assistance  Gait / Transfers Assistance Needed: Uses RW ADL's / Homemaking Assistance Needed: Independent in ADLs, reports independence in cooking and cleaning, but recently only performing light IADLs/less cooking   Comments: per chart review  and daughter pt was ambulating in home with RW and no assist. Pt reports has cut back on cooking and cleaning recently.        OT Problem List: Decreased strength;Decreased range of motion;Decreased activity tolerance;Impaired balance (sitting and/or standing);Decreased cognition;Decreased safety awareness;Pain;Impaired UE functional use      OT Treatment/Interventions: Self-care/ADL training;Cognitive remediation/compensation;Patient/family education;Therapeutic exercise;Energy conservation;DME and/or AE instruction;Therapeutic activities    OT Goals(Current goals can be found in the care plan section) Acute Rehab OT Goals Patient Stated Goal: To improve strength OT Goal Formulation: With patient Time For Goal Achievement: 08/14/21 Potential to Achieve Goals: Good  OT Frequency: Min 2X/week   Barriers to D/C:            Co-evaluation              AM-PAC OT "6 Clicks" Daily Activity     Outcome Measure Help from another person eating meals?: A Little Help from another person taking care of personal grooming?: A Lot Help from another person toileting, which includes using toliet, bedpan, or urinal?: A Lot Help from another person bathing (including washing, rinsing, drying)?: A Lot Help from another person to put on and taking off regular upper body clothing?: A Little Help from another person to put on and taking off regular lower body clothing?: Total 6 Click Score: 13   End of Session Equipment Utilized During Treatment: Gait belt;Rolling walker Nurse Communication: Mobility status;Other (comment) (Pt reported buring sensation when peeing.)  Activity Tolerance: Patient tolerated treatment well Patient  left: in chair;with call bell/phone within reach;with chair alarm set;with family/visitor present  OT Visit Diagnosis: Unsteadiness on feet (R26.81);Other abnormalities of gait and mobility (R26.89);Muscle weakness (generalized) (M62.81);History of falling (Z91.81);Pain                 Time: (P) 0806-(P) 0852 OT Time Calculation (min): (P) 46 min Charges:  OT General Charges $OT Visit: 1 Visit OT Evaluation $OT Eval Moderate Complexity: 1 Mod OT Treatments $Self Care/Home Management : 23-37 mins  Jackquline Denmark, OTS Acute Rehab Office: [phone]   Coleby Yett 07/31/2021, 10:33 AM

## 2021-07-31 NOTE — Progress Notes (Signed)
Physical Therapy Treatment Patient Details Name: [name] MRN: [id number] DOB: [birth year] Today's Date: 07/31/2021   History of Present Illness Admitted 10/2 after syncopal episode and fall. CT clear for fracture or focal lesions, acute infarctions, hemmorrahge, hydrocephalus. PMH significant for CHF, HTN, and CKD stage 3-4.    PT Comments    The  patient  mobilized to sitting with mod assistance, ambulated x 30' with Rw and  mod assist of 2 for safety,, followed with recliner.  Patient with audible wheezes, RN aware.  Spo2 99% on RA, HR 103.  Continue PT  Recommendations for follow up therapy are one component of a multi-disciplinary discharge planning process, led by the attending physician.  Recommendations may be updated based on patient status, additional functional criteria and insurance authorization.  Follow Up Recommendations  SNF;Supervision/Assistance - 24 hour     Equipment Recommendations  None recommended by PT    Recommendations for Other Services       Precautions / Restrictions Precautions Precautions: Fall Precaution Comments: quickly incontitnet, take a brief. states she gets vertigo easily     Mobility  Bed Mobility   Bed Mobility: Supine to Sit     Supine to sit: Mod assist;HOB elevated     General bed mobility comments: Needing assist to bring LEs off bed and to push up into sitting.    Transfers Overall transfer level: Needs assistance Equipment used: Rolling walker (2 wheeled) Transfers: Sit to/from Stand Sit to Stand: Mod assist;+2 physical assistance;+2 safety/equipment         General transfer comment: assist to power up  tostand x 3 from bed.  Ambulation/Gait Ambulation/Gait assistance: Mod assist;+2 safety/equipment Gait Distance (Feet): 20 Feet Assistive device: Rolling walker (2 wheeled) Gait Pattern/deviations: Step-to pattern;Festinating;Shuffle     General Gait Details: steps shuffkling, improved  with  distance. Audible wheeze. SPO2 99% RA   Stairs             Wheelchair Mobility    Modified Rankin (Stroke Patients Only)       Balance   Sitting-balance support: Feet supported;Single extremity supported Sitting balance-Leahy Scale: Fair     Standing balance support: During functional activity;Bilateral upper extremity supported Standing balance-Leahy Scale: Poor Standing balance comment: reliant on support, tends to lean posterior                            Cognition Arousal/Alertness: Awake/alert   Overall Cognitive Status: No family/caregiver present to determine baseline cognitive functioning                                 General Comments: required frequent redirection on what Therapy was going to work with her on.      Exercises      General Comments        Pertinent Vitals/Pain Pain Assessment: No/denies pain    Home Living                      Prior Function            PT Goals (current goals can now be found in the care plan section) Progress towards PT goals: Progressing toward goals    Frequency    Min 2X/week      PT Plan Current plan remains appropriate;Frequency needs to be updated    Co-evaluation  AM-PAC PT "6 Clicks" Mobility   Outcome Measure  Help needed turning from your back to your side while in a flat bed without using bedrails?: A Lot Help needed moving from lying on your back to sitting on the side of a flat bed without using bedrails?: A Lot Help needed moving to and from a bed to a chair (including a wheelchair)?: A Lot Help needed standing up from a chair using your arms (e.g., wheelchair or bedside chair)?: A Lot Help needed to walk in hospital room?: A Lot Help needed climbing 3-5 steps with a railing? : Total 6 Click Score: 11    End of Session Equipment Utilized During Treatment: Gait belt Activity Tolerance: Patient tolerated treatment well Patient  left: in chair;with call bell/phone within reach;with chair alarm set;with family/visitor present Nurse Communication: Mobility status PT Visit Diagnosis: Other abnormalities of gait and mobility (R26.89);Muscle weakness (generalized) (M62.81);Difficulty in walking, not elsewhere classified (R26.2)     Time: 2824-1753 PT Time Calculation (min) (ACUTE ONLY): 22 min  Charges:  $Gait Training: 8-22 mins                     [place] Pager [phone] Office [phone]    Claretha Cooper 07/31/2021, 4:15 PM

## 2021-07-31 NOTE — TOC Progression Note (Signed)
Transition of Care [facility]) - Progression Note    Patient Details  Name: RIDHIMA GOLBERG MRN: [id number] Date of Birth: 1936-08-23  Transition of Care [facility]) CM/SW [place], [place] Phone Number: 07/31/2021, 12:13 PM  Clinical Narrative:   Went over bed offers with patient and daughter Ms [name].  Ms [name] requests that I check with an out of network facility to see what cost would be. TOC will continue to follow during the course of hospitalization.     Expected Discharge Plan: Skilled Nursing Facility Barriers to Discharge: SNF Pending bed offer  Expected Discharge Plan and Services Expected Discharge Plan: [place]   Discharge Planning Services: CM Consult Post Acute Care Choice: [place] Living arrangements for the past 2 months: Single Family Home                                       Social Determinants of Health (SDOH) Interventions    Readmission Risk Interventions No flowsheet data found.

## 2021-07-31 NOTE — Progress Notes (Signed)
Pharmacy Antibiotic Note  [name] is a 85 y.o. female with suspected meningitis meningitis.  Pharmacy has been consulted for ampicillin, vancomycin, ceftriaxone and acyclovir dosing.  Today, 07/31/2021: - day #3 abx - afeb, wbc 21 (on prednisone) - scr down 1.25 (crcl ~30). NS @ 100 ml/hr - lumbar puncture on 10/5 --> glucose slightly elevated 76, protein high 129 - all cultures have been negative thus far  Plan: - continue vancomycin 750mg  IV q24h (goal trough 15-20) - ampicillin 2gm q8h - ceftriaxone 2gm IV q12h - adjust acyclovir to 700 mg IV q12h  __________________________________________ Height: 5' (152.4 cm) Weight: 70.3 kg (155 lb) IBW/kg (Calculated) : 45.5  Temp (24hrs), Avg:98.5 F (36.9 C), Min:98 F (36.7 C), Max:98.9 F (37.2 C)  Recent Labs  Lab 07/27/21 1640 07/27/21 1833 07/28/21 0426 07/29/21 0519 07/30/21 0515 07/31/21 0527  WBC 12.6*  --  12.8* 16.6* 29.8* 21.0*  CREATININE 1.37*  --  1.18* 1.61* 1.62* 1.25*  LATICACIDVEN 2.4* 1.5  --   --   --   --      Estimated Creatinine Clearance: 29.8 mL/min (A) (by C-G formula based on SCr of 1.25 mg/dL (H)).      Vancomycin 10/2 >> x1, 10/4>> Ceftriaxone  10/2  >> x 1, 10/4>> Ampicillin 10/4>> Acyclovir 10/4>>   10/2 BCx x2:  10/2 UCx: NG final  10/4 resp pcr: neg 10/4 bcx x2:  10/4 cryptococcal antigen: neg 10/5 fungus culture:  Thank you for allowing pharmacy to be a part of this patient's care.  Lynelle Doctor 07/31/2021 9:08 AM

## 2021-07-31 NOTE — Progress Notes (Signed)
PROGRESS NOTE    MAIRIN LINDSLEY  YOV:[id number]  DOB: 01/29/1936  DOA: 07/27/2021 PCP: Biagio Borg, MD Outpatient Specialists:   Hospital course:  85 year old female with HFrEF, HTN, CKD 3 was admitted with metabolic encephalopathy after being found down.  Head CT was negative.  No evidence of UTI.  Patient's mental status has improved possibly secondary to holding of Ultram and Neurontin and/or resolution of possible viral encephalopathy.   Subjective: Patient states "I feel better since I got out of bed.  Its been 40 years since have been in the hospital and I wanted to be another 40 before I come back.  I do have problems with vertigo.  Patient denies headache or photophobia.   Objective: Vitals:   07/30/21 2222 07/31/21 0452 07/31/21 1218 07/31/21 1233  BP: (!) 158/78 (!) 177/84  (!) 184/89  Pulse: 86 87  80  Resp: 18 20  20   Temp: 98.9 F (37.2 C) 98 F (36.7 C)  98.6 F (37 C)  TempSrc:    Oral  SpO2: 98% 98% 97% 98%  Weight:      Height:        Intake/Output Summary (Last 24 hours) at 07/31/2021 1602 Last data filed at 07/31/2021 1327 Gross per 24 hour  Intake 1260.63 ml  Output 500 ml  Net 760.63 ml   Filed Weights   07/27/21 1626  Weight: 70.3 kg     Exam:  General: Patient is awake and alert and appropriately conversant.  She does not seem altered to me in any way. Eyes: sclera anicteric, conjuctiva mild injection bilaterally CVS: S1-S2, regular  Respiratory:  decreased air entry bilaterally secondary to decreased inspiratory effort, rales at bases  GI: NABS, soft, NT  LE: No edema.  Neuro: A/O x 3, Moving all extremities equally with normal strength, CN 3-12 intact, grossly nonfocal.     Assessment & Plan:   85 year old female with possible concern for early dementia was admitted with metabolic encephalopathy now resolved.  Metabolic encephalopathy Appreciate neurology consultation, LP done and CSF revealed only a mild elevation of  protein which is nonspecific but can be seen in viral infections. Plan is to continue acyclovir until HSV from CSF results negative. Holding of Neurontin and Ultram may also have helped.  Hypokalemia We will replete and recheck  Leukocytosis No clear infectious etiology other than possibly viral meningitis which appears to be resolving.  Also has been attributed to ongoing steroids use.  Elevated CPK CPK 504 was thought to be possibly secondary to patient being found down.   Patient was treated with some IV fluids although carefully given known history of HFpEF. We will repeat for the morning.  HFpEF Appears to be euvolemic at present  CKD 4 Essentially hovering around baseline  Chronic pain Has been treated with prednisone which I will continue     DVT prophylaxis: Lovenox Code Status: Full Family Communication: None Disposition Plan:   Patient is from: Home  Anticipated Discharge Location: TBD  Barriers to Discharge: Awaiting HSV cultures results  Is patient medically stable for Discharge: Not yet    Consultants: Interventional radiology Neurology Infectious disease   Procedures: Lumbar puncture   Antimicrobials: Zovirax 10/4-present IV Rocephin 10/4-present IV vancomycin 10/4-present IV ampicillin 10/4-present   Scheduled Meds:  aspirin EC  81 mg Oral q morning   enoxaparin (LOVENOX) injection  30 mg Subcutaneous Q24H   feeding supplement  237 mL Oral BID BM   mometasone-formoterol  2 puff Inhalation  BID   montelukast  10 mg Oral QHS   multivitamin with minerals  1 tablet Oral Daily   pantoprazole  40 mg Oral Daily   predniSONE  6 mg Oral Q breakfast   Continuous Infusions:  sodium chloride 100 mL/hr at 07/31/21 0449   thiamine injection 500 mg (07/31/21 1438)    Data Reviewed:  Basic Metabolic Panel: Recent Labs  Lab 07/27/21 1640 07/28/21 0005 07/28/21 0426 07/29/21 0519 07/30/21 0515 07/31/21 0527  NA 142  --  143 143 141 138  K 3.7   --  3.1* 3.4* 4.4 3.2*  CL 102  --  107 111 107 106  CO2 28  --  28 25 24 22   GLUCOSE 143*  --  99 137* 163* 108*  BUN 28*  --  22 28* 34* 24*  CREATININE 1.37*  --  1.18* 1.61* 1.62* 1.25*  CALCIUM 9.9  --  9.1 9.0 8.8* 8.4*  MG  --  1.3*  --   --  1.7 1.6*    CBC: Recent Labs  Lab 07/27/21 1640 07/28/21 0426 07/29/21 0519 07/30/21 0515 07/31/21 0527  WBC 12.6* 12.8* 16.6* 29.8* 21.0*  NEUTROABS 10.8*  --   --   --   --   HGB 12.0 11.1* 9.3* 9.2* 9.2*  HCT 38.2 34.9* 28.8* 28.5* 29.1*  MCV 100.8* 101.5* 99.3 101.1* 100.7*  PLT 273 234 188 173 188    Studies: DG FLUORO GUIDE LUMBAR PUNCTURE  Result Date: 07/30/2021 CLINICAL DATA:  85 year old female referred for lumbar puncture EXAM: DIAGNOSTIC LUMBAR PUNCTURE UNDER FLUOROSCOPIC GUIDANCE COMPARISON:  MR lumbar 12/27/2017 FLUOROSCOPY TIME:  Fluoroscopy Time:  8 minutes 30 seconds PROCEDURE: Informed consent was obtained from the patient prior to the procedure by Dr. Clovis Riley, including potential complications of headache, allergy, and pain. With the patient prone, the lower back was prepped with Betadine. 1% Lidocaine was used for local anesthesia. Lumbar puncture was attempted at the L2-L3 level using a 20 gauge needle. Dr. Earleen Newport then joined Dr. Clovis Riley for further attempt. Additional site was attempted with further administration of 1% lidocaine at the L3-L4 level. The patient was uncomfortable in this position, and was reposition. This necessitated removal of the needle. Additional 1% lidocaine was then administered for attempt at the L4-L5 level. Again, 20 gauge needle was used for placement under fluoroscopy. Once there was return of CSF, approximately 9 cc of CSF were obtained for laboratory studies. The patient tolerated the procedure well and there were no apparent complications. IMPRESSION: Fluoro guided LP, successful at L4-L5 as above. Electronically Signed   By: Corrie Mckusick D.O.   On: 07/30/2021 13:41    Principal  Problem:   Altered mental status Active Problems:   Essential hypertension   CKD (chronic kidney disease) stage 4, GFR 15-29 ml/min (HCC)   Chronic pain   Long term (current) use of systemic steroids   Chronic diastolic CHF (congestive heart failure) (HCC)   AMS (altered mental status)   Metabolic encephalopathy   Sepsis with encephalopathy without septic shock ([place])     Jerolene Kupfer Derek Jack, [facility]  If 7PM-7AM, please contact night-coverage [email or web address]   LOS: 2 days

## 2021-07-31 NOTE — Progress Notes (Signed)
[facility]  Date of Admission:  07/27/2021     CC: ams/sepsis                                       Lines:  Peripheral iv's   Abx: 10/04-c acyclovir iv 10/04-c amp 10/04-c vanc 10/04-c ceftriaxone                                                        Assessment: Acute on chronic encephalopathy Sepsis   85 yo female very functional baseline, recently dxed inflammatory arthritis on chronic prednisone (highest dose 10 mg but down to 6 mg the last several weeks), concern by family for a year progression of s/s of dementia, worsening mentation/delirium the past 1-2 weeks prior to admission, course with sepsis/fever on HD#2 concerning for meningitis   Difficult to say if metabolic encephalopathy from nonspecific febrile syndrome or primary cns process (less likely)   Bcx/ucx so far negative Cxr clear No s/s localizing otherwise outside cns No new medications such as ssri/neuroleptics or any for that matter   Brain mri without classic HSV changes.   For now agree LP for further w/u. Empiric abx including acyclovir. Will adjust/deescalate abx with result   Of note, prednisone dose rather low, but with extreme of age, will also send for fungal process such as crypto (family reports patient with headache complaint of 2-3 weeks and vertigo that may be longer in duration). It is a rather low endemicity area for histo/blasto here and no prodrome story of such so will defer that testing   --------------- 10/06 assessment Mentation much improved Csf bland (<5wbc; normal glucose) Patient's mentation is much better today. Said she is hungry LP pending for today morning Fever resolved  Wbc up in setting of stress dose hydrocortisone. No eosinophilia. Remains on her home dose prednisone. I still do not see obvious sign of adrenal crisis which certainly can present like this  Hiv/respiratory viral pcr negative. Serum crypto Ag negative  At this  time appear nonspecific viral process with metabolic encephalopathy. The process is resolving/resolved. No sign of cns infection ok to stop antimicrobials   Plan:  Stop all antimicrobials Continue supportive care/pt-ot per primary team Ok to discharge from id standpoint once ready per primary team Will sign off  Principal Problem:   Altered mental status Active Problems:   Essential hypertension   CKD (chronic kidney disease) stage 4, GFR 15-29 ml/min (HCC)   Chronic pain   Long term (current) use of systemic steroids   Chronic diastolic CHF (congestive heart failure) (HCC)   AMS (altered mental status)   Metabolic encephalopathy   Sepsis with encephalopathy without septic shock (HCC)   Allergies  Allergen Reactions   Hydrocodone Itching   Lasix [Furosemide] Other (See Comments)    Dizziness.    Tizanidine Other (See Comments)    Dizzy and fall   Iron Hives and Other (See Comments)     bad constipation     Scheduled Meds:  aspirin EC  81 mg Oral q morning   enoxaparin (LOVENOX) injection  30 mg Subcutaneous Q24H   feeding supplement  237 mL Oral BID BM  mometasone-formoterol  2 puff Inhalation BID   montelukast  10 mg Oral QHS   multivitamin with minerals  1 tablet Oral Daily   pantoprazole  40 mg Oral Daily   predniSONE  6 mg Oral Q breakfast   Continuous Infusions:  sodium chloride 100 mL/hr at 07/31/21 0449   thiamine injection 500 mg (07/31/21 0735)   PRN Meds:.acetaminophen **OR** acetaminophen, albuterol, naphazoline-glycerin, ondansetron **OR** ondansetron (ZOFRAN) IV   SUBJECTIVE: No complaint Mentioned she has right frozen shoulder Csf bland  Review of Systems: ROS All other ROS was negative, except mentioned above     OBJECTIVE: Vitals:   07/30/21 2222 07/31/21 0452 07/31/21 1218 07/31/21 1233  BP: (!) 158/78 (!) 177/84  (!) 184/89  Pulse: 86 87  80  Resp: 18 20  20   Temp: 98.9 F (37.2 C) 98 F (36.7 C)  98.6 F (37 C)  TempSrc:     Oral  SpO2: 98% 98% 97% 98%  Weight:      Height:       Body mass index is 30.27 kg/m.  Physical Exam General/constitutional: no distress, pleasant, verbal/appropriately conversant, cooperative HEENT: Normocephalic, PER, Conj Clear, EOMI, Oropharynx clear Neck supple CV: rrr no mrg Lungs: clear to auscultation, normal respiratory effort Abd: Soft, Nontender Ext: no edema Skin: No Rash Neuro: generalized weakness; nonfocal MSK: no peripheral joint swelling/tenderness/warmth; back spines nontender   Central line presence: no   Lab Results Lab Results  Component Value Date   WBC 21.0 (H) 07/31/2021   HGB 9.2 (L) 07/31/2021   HCT 29.1 (L) 07/31/2021   MCV 100.7 (H) 07/31/2021   PLT 188 07/31/2021    Lab Results  Component Value Date   CREATININE 1.25 (H) 07/31/2021   BUN 24 (H) 07/31/2021   NA 138 07/31/2021   K 3.2 (L) 07/31/2021   CL 106 07/31/2021   CO2 22 07/31/2021    Lab Results  Component Value Date   ALT 34 07/31/2021   AST 35 07/31/2021   ALKPHOS 67 07/31/2021   BILITOT 0.5 07/31/2021      Microbiology: Recent Results (from the past 240 hour(s))  Blood culture (routine x 2)     Status: None (Preliminary result)   Collection Time: 07/27/21  4:40 PM   Specimen: BLOOD  Result Value Ref Range Status   Specimen Description   Final    BLOOD SITE NOT SPECIFIED Performed at [facility] [address], [place], [place] [address]    Special Requests   Final    BOTTLES DRAWN AEROBIC AND ANAEROBIC Blood Culture adequate volume Performed at [facility] [address] [place], [place] [address]    Culture   Final    NO GROWTH 4 DAYS Performed at [facility], [place] [address], [place], [place] [address]    Report Status PENDING  Incomplete  Blood culture (routine x 2)     Status: None (Preliminary result)   Collection Time: 07/27/21  4:40 PM   Specimen: BLOOD RIGHT FOREARM  Result Value Ref Range Status    Specimen Description   Final    BLOOD RIGHT FOREARM Performed at [facility], [place] [address] [place], [place] [address]    Special Requests   Final    BOTTLES DRAWN AEROBIC AND ANAEROBIC Blood Culture results may not be optimal due to an excessive volume of blood received in culture bottles Performed at [facility] [address] [place], [place]  [address]    Culture   Final    NO GROWTH 4 DAYS Performed at [facility], [place] [address] [place], [place] [address]    Report Status PENDING  Incomplete  Resp Panel by RT-PCR (Flu A&B, Covid) Nasopharyngeal Swab     Status: None   Collection Time: 07/27/21  4:54 PM   Specimen: Nasopharyngeal Swab; Nasopharyngeal(NP) swabs in vial transport medium  Result Value Ref Range Status   SARS Coronavirus 2 by RT PCR NEGATIVE NEGATIVE Final    Comment: (NOTE) SARS-CoV-2 target nucleic acids are NOT DETECTED.  The SARS-CoV-2 RNA is generally detectable in upper respiratory specimens during the acute phase of infection. The lowest concentration of SARS-CoV-2 viral copies this assay can detect is 138 copies/mL. A negative result does not preclude SARS-Cov-2 infection and should not be used as the sole basis for treatment or other patient management decisions. A negative result may occur with  improper specimen collection/handling, submission of specimen other than nasopharyngeal swab, presence of viral mutation(s) within the areas targeted by this assay, and inadequate number of viral copies(<138 copies/mL). A negative result must be combined with clinical observations, patient history, and epidemiological information. The expected result is Negative.  Fact Sheet for Patients:  EntrepreneurPulse.com.au  Fact Sheet for Healthcare Providers:  IncredibleEmployment.be  This test is no t yet approved or cleared by the [place] FDA and  has been authorized for detection  and/or diagnosis of SARS-CoV-2 by FDA under an Emergency Use Authorization (EUA). This EUA will remain  in effect (meaning this test can be used) for the duration of the COVID-19 declaration under Section 564(b)(1) of the Act, 21 U.S.C.section 360bbb-3(b)(1), unless the authorization is terminated  or revoked sooner.       Influenza A by PCR NEGATIVE NEGATIVE Final   Influenza B by PCR NEGATIVE NEGATIVE Final    Comment: (NOTE) The Xpert Xpress SARS-CoV-2/FLU/RSV plus assay is intended as an aid in the diagnosis of influenza from Nasopharyngeal swab specimens and should not be used as a sole basis for treatment. Nasal washings and aspirates are unacceptable for Xpert Xpress SARS-CoV-2/FLU/RSV testing.  Fact Sheet for Patients: EntrepreneurPulse.com.au  Fact Sheet for Healthcare Providers: IncredibleEmployment.be  This test is not yet approved or cleared by the [place] FDA and has been authorized for detection and/or diagnosis of SARS-CoV-2 by FDA under an Emergency Use Authorization (EUA). This EUA will remain in effect (meaning this test can be used) for the duration of the COVID-19 declaration under Section 564(b)(1) of the Act, 21 U.S.C. section 360bbb-3(b)(1), unless the authorization is terminated or revoked.  Performed at [facility], [place] [address], [place], [place] [address]   Urine Culture     Status: None   Collection Time: 07/27/21  8:54 PM   Specimen: In/Out Cath Urine  Result Value Ref Range Status   Specimen Description   Final    IN/OUT CATH URINE Performed at [facility] [address] [place], [place] [address]    Special Requests   Final    NONE Performed at [facility], [place] [address] [place], [place] [address]    Culture   Final    NO GROWTH Performed at [facility], [place] [address] [place], [place] [address]    Report Status 07/28/2021  FINAL  Final  Culture, blood (routine x 2)     Status: None (Preliminary result)   Collection Time: 07/29/21  3:37 PM  Specimen: BLOOD LEFT WRIST  Result Value Ref Range Status   Specimen Description   Final    BLOOD LEFT WRIST Performed at [facility] [address], [place], [place] [address]    Special Requests   Final    BOTTLES DRAWN AEROBIC ONLY Blood Culture results may not be optimal due to an inadequate volume of blood received in culture bottles Performed at [facility] [address], [place], [place] [address]    Culture   Final    NO GROWTH 2 DAYS Performed at [facility] [address], [place], [place] [address]    Report Status PENDING  Incomplete  Culture, blood (routine x 2)     Status: None (Preliminary result)   Collection Time: 07/29/21  3:38 PM   Specimen: BLOOD  Result Value Ref Range Status   Specimen Description   Final    BLOOD LEFT ANTECUBITAL Performed at [facility] [address], [place], [place] [address]    Special Requests   Final    BOTTLES DRAWN AEROBIC ONLY Blood Culture results may not be optimal due to an inadequate volume of blood received in culture bottles Performed at [facility] [address], [place], [place] [address]    Culture   Final    NO GROWTH 2 DAYS Performed at [facility] [address], [place], [place] [address]    Report Status PENDING  Incomplete  Respiratory (~20 pathogens) panel by PCR     Status: None   Collection Time: 07/29/21  5:46 PM   Specimen: Nasopharyngeal Swab; Respiratory  Result Value Ref Range Status   Adenovirus NOT DETECTED NOT DETECTED Final   Coronavirus 229E NOT DETECTED NOT DETECTED Final    Comment: (NOTE) The Coronavirus on the Respiratory Panel, DOES NOT test for the novel  Coronavirus (2019 nCoV)    Coronavirus HKU1 NOT DETECTED NOT DETECTED Final   Coronavirus NL63 NOT DETECTED NOT  DETECTED Final   Coronavirus OC43 NOT DETECTED NOT DETECTED Final   Metapneumovirus NOT DETECTED NOT DETECTED Final   Rhinovirus / Enterovirus NOT DETECTED NOT DETECTED Final   Influenza A NOT DETECTED NOT DETECTED Final   Influenza B NOT DETECTED NOT DETECTED Final   Parainfluenza Virus 1 NOT DETECTED NOT DETECTED Final   Parainfluenza Virus 2 NOT DETECTED NOT DETECTED Final   Parainfluenza Virus 3 NOT DETECTED NOT DETECTED Final   Parainfluenza Virus 4 NOT DETECTED NOT DETECTED Final   Respiratory Syncytial Virus NOT DETECTED NOT DETECTED Final   Bordetella pertussis NOT DETECTED NOT DETECTED Final   Bordetella Parapertussis NOT DETECTED NOT DETECTED Final   Chlamydophila pneumoniae NOT DETECTED NOT DETECTED Final   Mycoplasma pneumoniae NOT DETECTED NOT DETECTED Final    Comment: Performed at [facility], [place]. [address] [place], [place] [address]  CSF culture w Gram Stain     Status: None (Preliminary result)   Collection Time: 07/30/21 12:30 PM   Specimen: CSF; Cerebrospinal Fluid  Result Value Ref Range Status   Specimen Description   Final    CSF Performed at [facility] [address], [place], [place] [address]    Special Requests   Final    NONE Performed at [facility], [place] [address], [place], [place] [address]    Gram Stain   Final    WBC PRESENT, PREDOMINANTLY MONONUCLEAR NO ORGANISMS SEEN CYTOSPIN SMEAR Gram Stain Report  Called to,Read Back By and Verified With: B.CATES LAND, RN AT 9767 ON 10.05.22 BY N.THOMPSON Performed at [facility] [address], [place], [place] [address]    Culture   Final    NO GROWTH < 24 HOURS Performed at [facility] [address], [place], [place] [address]    Report Status PENDING  Incomplete  Culture, fungus without smear     Status: None (Preliminary result)   Collection Time: 07/30/21 12:30 PM   Specimen: CSF; Cerebrospinal Fluid  Result Value Ref  Range Status   Specimen Description   Final    CSF Performed at [facility] [address], [place], [place] [address]    Special Requests   Final    NONE Performed at [facility], [place] [address], [place], [place] [address]    Culture   Final    NO GROWTH < 24 HOURS Performed at [facility] [address], [place], [place] [address]    Report Status PENDING  Incomplete     Serology:   Imaging: If present, new imagings (plain films, ct scans, and mri) have been personally visualized and interpreted; radiology reports have been reviewed. Decision making incorporated into the Impression / Recommendations.  07/28/2021 mri brain no contrast No acute or reversible finding. Mild age related volume loss. Mild to moderate chronic small-vessel ischemic changes of the cerebral hemispheric white matter.   10/02 ct cervical spine No acute osseous process   10/02 cxr No active acute cardiopulm process Jabier Mutton, [facility] for [facility] [phone] pager    07/31/2021, 12:58 PM

## 2021-07-31 NOTE — Plan of Care (Signed)
CSF is reassuring overall, elevated protein is a non-specific finding but can be seen in viral infections. Recommend continuing acyclovir until HSV from CSF results negative (typically takes 1-2 days). Otherwise defer antibiotic de-escalation to ID. As patient has been improving, do not anticipate further inpatient neurological workup will be needed, but please do reach out to neurology if new questions or concerns arise.   Lesleigh Noe MD-PhD [facility] [phone]  Available 7 AM to 7 PM, outside these hours please contact Neurologist on call listed on AMION

## 2021-08-01 DIAGNOSIS — R401 Stupor: Secondary | ICD-10-CM

## 2021-08-01 DIAGNOSIS — I10 Essential (primary) hypertension: Secondary | ICD-10-CM | POA: Diagnosis not present

## 2021-08-01 DIAGNOSIS — G8929 Other chronic pain: Secondary | ICD-10-CM | POA: Diagnosis not present

## 2021-08-01 DIAGNOSIS — N184 Chronic kidney disease, stage 4 (severe): Secondary | ICD-10-CM | POA: Diagnosis not present

## 2021-08-01 LAB — CK: Total CK: 204 U/L (ref 38–234)

## 2021-08-01 LAB — CBC WITH DIFFERENTIAL/PLATELET
Abs Immature Granulocytes: 0.21 10*3/uL — ABNORMAL HIGH (ref 0.00–0.07)
Basophils Absolute: 0.1 10*3/uL (ref 0.0–0.1)
Basophils Relative: 1 %
Eosinophils Absolute: 0.2 10*3/uL (ref 0.0–0.5)
Eosinophils Relative: 1 %
HCT: 28.9 % — ABNORMAL LOW (ref 36.0–46.0)
Hemoglobin: 9.2 g/dL — ABNORMAL LOW (ref 12.0–15.0)
Immature Granulocytes: 1 %
Lymphocytes Relative: 12 %
Lymphs Abs: 1.9 10*3/uL (ref 0.7–4.0)
MCH: 32.1 pg (ref 26.0–34.0)
MCHC: 31.8 g/dL (ref 30.0–36.0)
MCV: 100.7 fL — ABNORMAL HIGH (ref 80.0–100.0)
Monocytes Absolute: 1.1 10*3/uL — ABNORMAL HIGH (ref 0.1–1.0)
Monocytes Relative: 7 %
Neutro Abs: 11.7 10*3/uL — ABNORMAL HIGH (ref 1.7–7.7)
Neutrophils Relative %: 78 %
Platelets: 198 10*3/uL (ref 150–400)
RBC: 2.87 MIL/uL — ABNORMAL LOW (ref 3.87–5.11)
RDW: 14.3 % (ref 11.5–15.5)
WBC: 15.1 10*3/uL — ABNORMAL HIGH (ref 4.0–10.5)
nRBC: 0 % (ref 0.0–0.2)

## 2021-08-01 LAB — CULTURE, BLOOD (ROUTINE X 2)
Culture: NO GROWTH
Culture: NO GROWTH
Special Requests: ADEQUATE

## 2021-08-01 LAB — BASIC METABOLIC PANEL
Anion gap: 9 (ref 5–15)
BUN: 18 mg/dL (ref 8–23)
CO2: 22 mmol/L (ref 22–32)
Calcium: 9.2 mg/dL (ref 8.9–10.3)
Chloride: 109 mmol/L (ref 98–111)
Creatinine, Ser: 1.2 mg/dL — ABNORMAL HIGH (ref 0.44–1.00)
GFR, Estimated: 45 mL/min — ABNORMAL LOW (ref >60)
Glucose, Bld: 98 mg/dL (ref 70–99)
Potassium: 3.8 mmol/L (ref 3.5–5.1)
Sodium: 140 mmol/L (ref 135–145)

## 2021-08-01 LAB — MISC LABCORP TEST (SEND OUT): Labcorp test code: 9985

## 2021-08-01 LAB — HSV 1/2 PCR, CSF
HSV-1 DNA: NEGATIVE
HSV-2 DNA: NEGATIVE

## 2021-08-01 NOTE — Care Management Important Message (Signed)
Important Message  Patient Details IM Letter given to the Patient. Name: [name] MRN: [id number] Date of Birth: 1935/11/25   Medicare Important Message Given:  Yes     Kerin Salen 08/01/2021, 10:33 AM

## 2021-08-01 NOTE — Progress Notes (Signed)
Pt refused to take montelukast; education was provided

## 2021-08-01 NOTE — TOC Progression Note (Addendum)
Transition of Care [facility]) - Progression Note    Patient Details  Name: [name] MRN: [id number] Date of Birth: August 14, 1936  Transition of Care [facility]) CM/SW [place], [place] Phone Number: 08/01/2021, 10:53 AM  Clinical Narrative:   Have not yet heard back from [facility] about out of pocket cost.  Spoke with daughter, and agreed to expand search to [facility] as she works in [place], and there are other family members in [place].  Bed search expanded. TOC will continue to follow during the course of hospitalization. Addendum: Family chooses bed at [facility] in [place].  Neoma Laming will start insurance authorization request today.    Expected Discharge Plan: Skilled Nursing Facility Barriers to Discharge: SNF Pending bed offer  Expected Discharge Plan and Services Expected Discharge Plan: [place]   Discharge Planning Services: CM Consult Post Acute Care Choice: [place] Living arrangements for the past 2 months: Single Family Home                                       Social Determinants of Health (SDOH) Interventions    Readmission Risk Interventions No flowsheet data found.

## 2021-08-01 NOTE — Progress Notes (Addendum)
PROGRESS NOTE  [name]:[id number] DOB: 14-Oct-1936 DOA: 07/27/2021 PCP: Biagio Borg, MD  HPI/Recap of past 11 hours: 85 year old female with HFrEF, hypertension, chronic kidney disease stage III, who was admitted with metabolic encephalopathy after being found down.  Head CT was negative there was no evidence of UTI.  Mental status has improved somewhat but still confused.  Her Neurontin and Ultram have been held with improvement  Subjective: August 01, 2021, Patient seen and examined at bedside her friend [name] she says she is a good friend is at bedside.  Assessment/Plan: Principal Problem:   Altered mental status Active Problems:   Essential hypertension   CKD (chronic kidney disease) stage 4, GFR 15-29 ml/min (HCC)   Chronic pain   Long term (current) use of systemic steroids   Chronic diastolic CHF (congestive heart failure) (HCC)   AMS (altered mental status)   Metabolic encephalopathy   Sepsis with encephalopathy without septic shock (HCC) Metabolic encephalopathy. Neurology consulted LP done and CSF reveals only mild elevation of protein which is nonspecific but can be seen in viral infection Continue acyclovir until HSV from CSF results are negative Still holding Neurontin and Ultram  2.  Hypokalemia we will replete  3.  Leukocytosis no clear infectious etiology  7.  Elevated CK was thought to be due to patient being down when she was found She was treated with IV fluid cautiously given her history of HFpEF We will continue to monitor particularly renal function  6.  HFpEF Patient appears euvolemic  7.  CKD4. Patient IV hydrated from her elevated CK we will continue to monitor Code Status: Full  Severity of Illness: The appropriate patient status for this patient is INPATIENT. Inpatient status is judged to be reasonable and necessary in order to provide the required intensity of service to ensure the patient's safety. The patient's presenting  symptoms, physical exam findings, and initial radiographic and laboratory data in the context of their chronic comorbidities is felt to place them at high risk for further clinical deterioration. Furthermore, it is not anticipated that the patient will be medically stable for discharge from the hospital within 2 midnights of admission. The following factors support the patient status of inpatient.  Patient still confused with encephalopathy   * I certify that at the point of admission it is my clinical judgment that the patient will require inpatient hospital care spanning beyond 2 midnights from the point of admission due to high intensity of service, high risk for further deterioration and high frequency of surveillance required.*   Family Communication: Good friend and at bedside At bedside  Disposition Plan: To be determined Status is: Inpatient   Dispo: The patient is from: Home              Anticipated d/c is to:               Anticipated d/c date is:               Patient currently not medically stable for discharge  Consultants: Neurology Infectious disease  Procedures: None  Antimicrobials: None  DVT prophylaxis: Lovenox   Objective: Vitals:   07/31/21 1233 07/31/21 1824 07/31/21 2126 08/01/21 0434  BP: (!) 184/89  (!) 153/81 (!) 188/92  Pulse: 80  82 85  Resp: 20  20 20   Temp: 98.6 F (37 C)  98.3 F (36.8 C) 98.5 F (36.9 C)  TempSrc: Oral  Oral Oral  SpO2: 98% 95% 100% 95%  Weight:      Height:        Intake/Output Summary (Last 24 hours) at 08/01/2021 0902 Last data filed at 08/01/2021 0600 Gross per 24 hour  Intake 3092.99 ml  Output 1975 ml  Net 1117.99 ml   Filed Weights   07/27/21 1626  Weight: 70.3 kg   Body mass index is 30.27 kg/m.  Exam:  General: 85 y.o. year-old female well developed well nourished in no acute distress.  Alert and oriented x3. Cardiovascular: Regular rate and rhythm with no rubs or gallops.  No thyromegaly or JVD  noted.   Respiratory: Clear to auscultation with no wheezes or rales. Good inspiratory effort. Abdomen: Soft nontender nondistended with normal bowel sounds x4 quadrants. Musculoskeletal: No lower extremity edema. 2/4 pulses in all 4 extremities. Skin: No ulcerative lesions noted or rashes, Psychiatry: Mood is appropriate for condition and setting Neurology: Patient still a little confused she knows her name was not able to say where she was she states that she was in her kitchen and or her living room she could not say she was in the hospital.    Data Reviewed: CBC: Recent Labs  Lab 07/27/21 1640 07/28/21 0426 07/29/21 0519 07/30/21 0515 07/31/21 0527 08/01/21 0522  WBC 12.6* 12.8* 16.6* 29.8* 21.0* 15.1*  NEUTROABS 10.8*  --   --   --   --  11.7*  HGB 12.0 11.1* 9.3* 9.2* 9.2* 9.2*  HCT 38.2 34.9* 28.8* 28.5* 29.1* 28.9*  MCV 100.8* 101.5* 99.3 101.1* 100.7* 100.7*  PLT 273 234 188 173 188 528   Basic Metabolic Panel: Recent Labs  Lab 07/28/21 0005 07/28/21 0426 07/29/21 0519 07/30/21 0515 07/31/21 0527 08/01/21 0522  NA  --  143 143 141 138 140  K  --  3.1* 3.4* 4.4 3.2* 3.8  CL  --  107 111 107 106 109  CO2  --  28 25 24 22 22   GLUCOSE  --  99 137* 163* 108* 98  BUN  --  22 28* 34* 24* 18  CREATININE  --  1.18* 1.61* 1.62* 1.25* 1.20*  CALCIUM  --  9.1 9.0 8.8* 8.4* 9.2  MG 1.3*  --   --  1.7 1.6*  --    GFR: Estimated Creatinine Clearance: 31.1 mL/min (A) (by C-G formula based on SCr of 1.2 mg/dL (H)). Liver Function Tests: Recent Labs  Lab 07/27/21 1640 07/28/21 0426 07/29/21 0519 07/31/21 0527  AST 57* 56* 41 35  ALT 34 29 27 34  ALKPHOS 73 63 52 67  BILITOT 1.0 0.8 0.8 0.5  PROT 7.3 6.2* 5.6* 5.8*  ALBUMIN 3.8 3.1* 2.7* 2.6*   No results for input(s): LIPASE, AMYLASE in the last 168 hours. Recent Labs  Lab 07/28/21 0005 07/29/21 1028  AMMONIA 38* 37*   Coagulation Profile: Recent Labs  Lab 07/27/21 1640  INR 1.0   Cardiac  Enzymes: Recent Labs  Lab 07/27/21 1640 07/28/21 0426 07/28/21 1228 07/30/21 0515 08/01/21 0522  CKTOTAL 1,200* 1,169* 1,007* 523* 204   BNP (last 3 results) Recent Labs    06/18/21 1648  PROBNP 56.0   HbA1C: No results for input(s): HGBA1C in the last 72 hours. CBG: Recent Labs  Lab 07/29/21 0915  GLUCAP 137*   Lipid Profile: No results for input(s): CHOL, HDL, LDLCALC, TRIG, CHOLHDL, LDLDIRECT in the last 72 hours. Thyroid Function Tests: No results for input(s): TSH, T4TOTAL, FREET4, T3FREE, THYROIDAB in the last 72 hours. Anemia Panel: No results for input(s):  VITAMINB12, FOLATE, FERRITIN, TIBC, IRON, RETICCTPCT in the last 72 hours. Urine analysis:    Component Value Date/Time   COLORURINE YELLOW 07/27/2021 2053   APPEARANCEUR CLEAR 07/27/2021 2053   LABSPEC 1.009 07/27/2021 2053   PHURINE 6.0 07/27/2021 2053   GLUCOSEU NEGATIVE 07/27/2021 2053   GLUCOSEU NEGATIVE 08/01/2020 1620   HGBUR MODERATE (A) 07/27/2021 2053   BILIRUBINUR NEGATIVE 07/27/2021 2053   KETONESUR 5 (A) 07/27/2021 2053   PROTEINUR NEGATIVE 07/27/2021 2053   UROBILINOGEN 0.2 08/01/2020 1620   NITRITE NEGATIVE 07/27/2021 2053   LEUKOCYTESUR NEGATIVE 07/27/2021 2053   Sepsis Labs: @LABRCNTIP (procalcitonin:4,lacticidven:4)  ) Recent Results (from the past 240 hour(s))  Blood culture (routine x 2)     Status: None (Preliminary result)   Collection Time: 07/27/21  4:40 PM   Specimen: BLOOD  Result Value Ref Range Status   Specimen Description   Final    BLOOD SITE NOT SPECIFIED Performed at [facility], [place] [address] [place], [place] [address]    Special Requests   Final    BOTTLES DRAWN AEROBIC AND ANAEROBIC Blood Culture adequate volume Performed at [place] [address], [place], [place] [address]    Culture   Final    NO GROWTH 4 DAYS Performed at [facility], [place] [address] [place], [place] [address]    Report Status  PENDING  Incomplete  Blood culture (routine x 2)     Status: None (Preliminary result)   Collection Time: 07/27/21  4:40 PM   Specimen: BLOOD RIGHT FOREARM  Result Value Ref Range Status   Specimen Description   Final    BLOOD RIGHT FOREARM Performed at [facility], [place] [address] [place], [place] [address]    Special Requests   Final    BOTTLES DRAWN AEROBIC AND ANAEROBIC Blood Culture results may not be optimal due to an excessive volume of blood received in culture bottles Performed at [place] [address], [place], [place] [address]    Culture   Final    NO GROWTH 4 DAYS Performed at [facility], [place] [address], [place], [place] [address]    Report Status PENDING  Incomplete  Resp Panel by RT-PCR (Flu A&B, Covid) Nasopharyngeal Swab     Status: None   Collection Time: 07/27/21  4:54 PM   Specimen: Nasopharyngeal Swab; Nasopharyngeal(NP) swabs in vial transport medium  Result Value Ref Range Status   SARS Coronavirus 2 by RT PCR NEGATIVE NEGATIVE Final    Comment: (NOTE) SARS-CoV-2 target nucleic acids are NOT DETECTED.  The SARS-CoV-2 RNA is generally detectable in upper respiratory specimens during the acute phase of infection. The lowest concentration of SARS-CoV-2 viral copies this assay can detect is 138 copies/mL. A negative result does not preclude SARS-Cov-2 infection and should not be used as the sole basis for treatment or other patient management decisions. A negative result may occur with  improper specimen collection/handling, submission of specimen other than nasopharyngeal swab, presence of viral mutation(s) within the areas targeted by this assay, and inadequate number of viral copies(<138 copies/mL). A negative result must be combined with clinical observations, patient history, and epidemiological information. The expected result is Negative.  Fact Sheet for Patients:   EntrepreneurPulse.com.au  Fact Sheet for Healthcare Providers:  IncredibleEmployment.be  This test is no t yet approved or cleared by the [place] FDA and  has been authorized for detection and/or diagnosis of SARS-CoV-2 by FDA  under an Emergency Use Authorization (EUA). This EUA will remain  in effect (meaning this test can be used) for the duration of the COVID-19 declaration under Section 564(b)(1) of the Act, 21 U.S.C.section 360bbb-3(b)(1), unless the authorization is terminated  or revoked sooner.       Influenza A by PCR NEGATIVE NEGATIVE Final   Influenza B by PCR NEGATIVE NEGATIVE Final    Comment: (NOTE) The Xpert Xpress SARS-CoV-2/FLU/RSV plus assay is intended as an aid in the diagnosis of influenza from Nasopharyngeal swab specimens and should not be used as a sole basis for treatment. Nasal washings and aspirates are unacceptable for Xpert Xpress SARS-CoV-2/FLU/RSV testing.  Fact Sheet for Patients: EntrepreneurPulse.com.au  Fact Sheet for Healthcare Providers: IncredibleEmployment.be  This test is not yet approved or cleared by the [place] FDA and has been authorized for detection and/or diagnosis of SARS-CoV-2 by FDA under an Emergency Use Authorization (EUA). This EUA will remain in effect (meaning this test can be used) for the duration of the COVID-19 declaration under Section 564(b)(1) of the Act, 21 U.S.C. section 360bbb-3(b)(1), unless the authorization is terminated or revoked.  Performed at [facility], [place] [address] [place], [place] [address]   Urine Culture     Status: None   Collection Time: 07/27/21  8:54 PM   Specimen: In/Out Cath Urine  Result Value Ref Range Status   Specimen Description   Final    IN/OUT CATH URINE Performed at [place] [address], [place], [place] [address]    Special Requests   Final     NONE Performed at [facility], [place] [address] [place], [place] [address]    Culture   Final    NO GROWTH Performed at [facility], [place] [address], [place], [place] [address]    Report Status 07/28/2021 FINAL  Final  Culture, blood (routine x 2)     Status: None (Preliminary result)   Collection Time: 07/29/21  3:37 PM   Specimen: BLOOD LEFT WRIST  Result Value Ref Range Status   Specimen Description   Final    BLOOD LEFT WRIST Performed at [place] [address] [place], [place] [address]    Special Requests   Final    BOTTLES DRAWN AEROBIC ONLY Blood Culture results may not be optimal due to an inadequate volume of blood received in culture bottles Performed at [place] [address], [place], [place] [address]    Culture   Final    NO GROWTH 2 DAYS Performed at [place] [address], [place], [place] [address]    Report Status PENDING  Incomplete  Culture, blood (routine x 2)     Status: None (Preliminary result)   Collection Time: 07/29/21  3:38 PM   Specimen: BLOOD  Result Value Ref Range Status   Specimen Description   Final    BLOOD LEFT ANTECUBITAL Performed at [place] [address] [place], [place] [address]    Special Requests   Final    BOTTLES DRAWN AEROBIC ONLY Blood Culture results may not be optimal due to an inadequate volume of blood received in culture bottles Performed at [place] [address] [place], [place] [address]    Culture   Final    NO GROWTH 2 DAYS Performed at [place] [address], [place], [place] [address]    Report  Status PENDING  Incomplete  Respiratory (~20 pathogens) panel by PCR     Status: None   Collection Time: 07/29/21  5:46 PM   Specimen: Nasopharyngeal Swab; Respiratory  Result Value Ref Range Status   Adenovirus NOT DETECTED NOT DETECTED Final   Coronavirus 229E NOT  DETECTED NOT DETECTED Final    Comment: (NOTE) The Coronavirus on the Respiratory Panel, DOES NOT test for the novel  Coronavirus (2019 nCoV)    Coronavirus HKU1 NOT DETECTED NOT DETECTED Final   Coronavirus NL63 NOT DETECTED NOT DETECTED Final   Coronavirus OC43 NOT DETECTED NOT DETECTED Final   Metapneumovirus NOT DETECTED NOT DETECTED Final   Rhinovirus / Enterovirus NOT DETECTED NOT DETECTED Final   Influenza A NOT DETECTED NOT DETECTED Final   Influenza B NOT DETECTED NOT DETECTED Final   Parainfluenza Virus 1 NOT DETECTED NOT DETECTED Final   Parainfluenza Virus 2 NOT DETECTED NOT DETECTED Final   Parainfluenza Virus 3 NOT DETECTED NOT DETECTED Final   Parainfluenza Virus 4 NOT DETECTED NOT DETECTED Final   Respiratory Syncytial Virus NOT DETECTED NOT DETECTED Final   Bordetella pertussis NOT DETECTED NOT DETECTED Final   Bordetella Parapertussis NOT DETECTED NOT DETECTED Final   Chlamydophila pneumoniae NOT DETECTED NOT DETECTED Final   Mycoplasma pneumoniae NOT DETECTED NOT DETECTED Final    Comment: Performed at [facility], [place]. [address], [place], [place] [address]  CSF culture w Gram Stain     Status: None (Preliminary result)   Collection Time: 07/30/21 12:30 PM   Specimen: CSF; Cerebrospinal Fluid  Result Value Ref Range Status   Specimen Description   Final    CSF Performed at [place] [address], [place], [place] [address]    Special Requests   Final    NONE Performed at [facility], [place] [address], [place], [place] [address]    Gram Stain   Final    WBC PRESENT, PREDOMINANTLY MONONUCLEAR NO ORGANISMS SEEN CYTOSPIN SMEAR Gram Stain Report Called to,Read Back By and Verified With: B.CATES LAND, RN AT 4174 ON 10.05.22 BY N.THOMPSON Performed at [place] [address], [place], [place] [address]    Culture   Final    NO GROWTH < 24 HOURS Performed at [place]  [address], [place], [place] [address]    Report Status PENDING  Incomplete  Culture, fungus without smear     Status: None (Preliminary result)   Collection Time: 07/30/21 12:30 PM   Specimen: CSF; Cerebrospinal Fluid  Result Value Ref Range Status   Specimen Description   Final    CSF Performed at [place] [address] [place], [place] [address]    Special Requests   Final    NONE Performed at [facility], [place] [address] [place], [place] [address]    Culture   Final    NO GROWTH < 24 HOURS Performed at [place] [address], [place], [place] [address]    Report Status PENDING  Incomplete      Studies: No results found.  Scheduled Meds:  aspirin EC  81 mg Oral q morning   enoxaparin (LOVENOX) injection  30 mg Subcutaneous Q24H   feeding supplement  237 mL Oral BID BM   mometasone-formoterol  2 puff Inhalation BID   montelukast  10 mg Oral QHS   multivitamin with minerals  1 tablet Oral Daily  pantoprazole  40 mg Oral Daily   predniSONE  6 mg Oral Q breakfast    Continuous Infusions:  sodium chloride 100 mL/hr at 08/01/21 0540     LOS: 3 days     Cristal Deer, MD [facility]  To reach me or the doctor on call, go to: [email or web address] Password [email or web address]  08/01/2021, 9:02 AM

## 2021-08-02 DIAGNOSIS — I10 Essential (primary) hypertension: Secondary | ICD-10-CM | POA: Diagnosis not present

## 2021-08-02 DIAGNOSIS — R401 Stupor: Secondary | ICD-10-CM | POA: Diagnosis not present

## 2021-08-02 DIAGNOSIS — N184 Chronic kidney disease, stage 4 (severe): Secondary | ICD-10-CM | POA: Diagnosis not present

## 2021-08-02 DIAGNOSIS — G8929 Other chronic pain: Secondary | ICD-10-CM | POA: Diagnosis not present

## 2021-08-02 LAB — CBC WITH DIFFERENTIAL/PLATELET
Abs Immature Granulocytes: 0.35 10*3/uL — ABNORMAL HIGH (ref 0.00–0.07)
Basophils Absolute: 0.1 10*3/uL (ref 0.0–0.1)
Basophils Relative: 1 %
Eosinophils Absolute: 0.1 10*3/uL (ref 0.0–0.5)
Eosinophils Relative: 1 %
HCT: 29.9 % — ABNORMAL LOW (ref 36.0–46.0)
Hemoglobin: 9.4 g/dL — ABNORMAL LOW (ref 12.0–15.0)
Immature Granulocytes: 3 %
Lymphocytes Relative: 10 %
Lymphs Abs: 1.3 10*3/uL (ref 0.7–4.0)
MCH: 31.5 pg (ref 26.0–34.0)
MCHC: 31.4 g/dL (ref 30.0–36.0)
MCV: 100.3 fL — ABNORMAL HIGH (ref 80.0–100.0)
Monocytes Absolute: 1.2 10*3/uL — ABNORMAL HIGH (ref 0.1–1.0)
Monocytes Relative: 9 %
Neutro Abs: 9.9 10*3/uL — ABNORMAL HIGH (ref 1.7–7.7)
Neutrophils Relative %: 76 %
Platelets: 200 10*3/uL (ref 150–400)
RBC: 2.98 MIL/uL — ABNORMAL LOW (ref 3.87–5.11)
RDW: 14.4 % (ref 11.5–15.5)
WBC: 12.9 10*3/uL — ABNORMAL HIGH (ref 4.0–10.5)
nRBC: 0 % (ref 0.0–0.2)

## 2021-08-02 LAB — BASIC METABOLIC PANEL
Anion gap: 10 (ref 5–15)
BUN: 18 mg/dL (ref 8–23)
CO2: 24 mmol/L (ref 22–32)
Calcium: 9 mg/dL (ref 8.9–10.3)
Chloride: 112 mmol/L — ABNORMAL HIGH (ref 98–111)
Creatinine, Ser: 1.1 mg/dL — ABNORMAL HIGH (ref 0.44–1.00)
GFR, Estimated: 50 mL/min — ABNORMAL LOW (ref >60)
Glucose, Bld: 113 mg/dL — ABNORMAL HIGH (ref 70–99)
Potassium: 3.8 mmol/L (ref 3.5–5.1)
Sodium: 146 mmol/L — ABNORMAL HIGH (ref 135–145)

## 2021-08-02 MED ORDER — ENOXAPARIN SODIUM 40 MG/0.4ML IJ SOSY
40.0000 mg | PREFILLED_SYRINGE | INTRAMUSCULAR | Status: DC
  Administered 2021-08-03: 40 mg via SUBCUTANEOUS
  Filled 2021-08-02: qty 0.4

## 2021-08-02 NOTE — Progress Notes (Signed)
Pt's BP is 169/89 and asymptomatic. Bl[place], NP was notified

## 2021-08-02 NOTE — Progress Notes (Signed)
Pt has poor effort at doing her Dulera BID inhaler.

## 2021-08-02 NOTE — Progress Notes (Signed)
PROGRESS NOTE  RECHY BOST NKN:[id number] DOB: [birth year] DOA: 07/27/2021 PCP: Biagio Borg, MD  HPI/Recap of past 71 hours: 85 year old female with HFrEF, hypertension, chronic kidney disease stage III, who was admitted with metabolic encephalopathy after being found down.  Head CT was negative there was no evidence of UTI.  Mental status has improved somewhat but still confused.  Her Neurontin and Ultram have been held with improvement  Subjective: August 01, 2021, Patient seen and examined at bedside her friend [name] she says she is a good friend is at bedside.  August 03, 2019 General: Patient seen and examined at bedside.  She was on the phone with her daughter She denies any complaint and there was no new event overnight nurse noted that patient is some not able to ambulate and she is even having a lot of difficulty rolling over she is more weak on the right side.  Patient had physical therapy evaluation on July 31, 2021 had recommended for SNF or 24-hour supervision/assistance  Assessment/Plan: Principal Problem:   Altered mental status Active Problems:   Essential hypertension   CKD (chronic kidney disease) stage 4, GFR 15-29 ml/min (HCC)   Chronic pain   Long term (current) use of systemic steroids   Chronic diastolic CHF (congestive heart failure) (HCC)   AMS (altered mental status)   Metabolic encephalopathy   Sepsis with encephalopathy without septic shock (HCC) Metabolic encephalopathy. sepsis Neurology following LP done and CSF reveals only mild elevation of protein which is nonspecific but can be seen in viral infection Continue acyclovir until HSV from CSF results are negative Still holding Neurontin and Ultram Blood culture was negative x5 days CSF culture is also negative x3 days CSF HSV-1 DNA PCR is negative discontinue acyclovir CSF fungal culture was negative Neurology has signed off for now ID has signed off   2.  Hypokalemia we will  replete  3.  Leukocytosis no clear infectious etiology leukocyte is improving it is 12.9 today  7.  Elevated CK was thought to be due to patient being down when she was found She was treated with IV fluid cautiously given her history of HFpEF We will continue to monitor particularly renal function  6.  HFpEF Patient appears euvolemic  7.  CKD4. Patient IV hydrated likely exacerbation from her elevated CK Creatinine is improved from 1.24-1.1 today  we will continue to monitor  Code Status: Full  Severity of Illness: The appropriate patient status for this patient is INPATIENT. Inpatient status is judged to be reasonable and necessary in order to provide the required intensity of service to ensure the patient's safety. The patient's presenting symptoms, physical exam findings, and initial radiographic and laboratory data in the context of their chronic comorbidities is felt to place them at high risk for further clinical deterioration. Furthermore, it is not anticipated that the patient will be medically stable for discharge from the hospital within 2 midnights of admission. The following factors support the patient status of inpatient.  Patient still confused with encephalopathy   * I certify that at the point of admission it is my clinical judgment that the patient will require inpatient hospital care spanning beyond 2 midnights from the point of admission due to high intensity of service, high risk for further deterioration and high frequency of surveillance required.*   Family Communication: October 7: Good friend and at bedside At bedside October 8: I had a lengthy discussion with her daughter [name] over the phone who was on  her way to the room she said she was stuck in traffic downstairs did not have any way to park.  I updated her on patient's progress she has specific questions about certain lab values which I discussed with her.   Disposition Plan: To SNF Status is: Inpatient    Dispo: The patient is from: Home              Anticipated d/c is to: Patient likely going to need SNF/rehab              Anticipated d/c date is: To be determined              Patient currently not medically stable for discharge  Consultants: Neurology Infectious disease  Procedures: None  Antimicrobials: None Acyclovir  DVT prophylaxis: Lovenox   Objective: Vitals:   08/01/21 1440 08/01/21 2022 08/02/21 0600 08/02/21 0809  BP:   (!) 169/89   Pulse:   92   Resp:   20   Temp:   97.9 F (36.6 C)   TempSrc:   Oral   SpO2: 92% 95% 95% 95%  Weight:      Height:        Intake/Output Summary (Last 24 hours) at 08/02/2021 1004 Last data filed at 08/02/2021 0900 Gross per 24 hour  Intake 240 ml  Output --  Net 240 ml    Filed Weights   07/27/21 1626  Weight: 70.3 kg   Body mass index is 30.27 kg/m.  Exam:  General: 85 y.o. year-old female well developed well nourished in no acute distress.  Alert and oriented x3. Cardiovascular: Regular rate and rhythm with no rubs or gallops.  No thyromegaly or JVD noted.   Respiratory: Clear to auscultation with no wheezes or rales. Good inspiratory effort. Abdomen: Soft nontender nondistended with normal bowel sounds x4 quadrants. Musculoskeletal: No lower extremity edema. 2/4 pulses in all 4 extremities. Skin: No ulcerative lesions noted or rashes, Psychiatry: Mood is appropriate for condition and setting Neurology: Patient still a little confused she knows her name was not able to say where she was she states that she was in her kitchen and or her living room she could not say she was in the hospital.    Data Reviewed: CBC: Recent Labs  Lab 07/27/21 1640 07/28/21 0426 07/29/21 0519 07/30/21 0515 07/31/21 0527 08/01/21 0522 08/02/21 0556  WBC 12.6*   < > 16.6* 29.8* 21.0* 15.1* 12.9*  NEUTROABS 10.8*  --   --   --   --  11.7* 9.9*  HGB 12.0   < > 9.3* 9.2* 9.2* 9.2* 9.4*  HCT 38.2   < > 28.8* 28.5* 29.1* 28.9*  29.9*  MCV 100.8*   < > 99.3 101.1* 100.7* 100.7* 100.3*  PLT 273   < > 188 173 188 198 200   < > = values in this interval not displayed.    Basic Metabolic Panel: Recent Labs  Lab 07/28/21 0005 07/28/21 0426 07/29/21 0519 07/30/21 0515 07/31/21 0527 08/01/21 0522 08/02/21 0556  NA  --    < > 143 141 138 140 146*  K  --    < > 3.4* 4.4 3.2* 3.8 3.8  CL  --    < > 111 107 106 109 112*  CO2  --    < > 25 24 22 22 24   GLUCOSE  --    < > 137* 163* 108* 98 113*  BUN  --    < > 28*  34* 24* 18 18  CREATININE  --    < > 1.61* 1.62* 1.25* 1.20* 1.10*  CALCIUM  --    < > 9.0 8.8* 8.4* 9.2 9.0  MG 1.3*  --   --  1.7 1.6*  --   --    < > = values in this interval not displayed.    GFR: Estimated Creatinine Clearance: 33.9 mL/min (A) (by C-G formula based on SCr of 1.1 mg/dL (H)). Liver Function Tests: Recent Labs  Lab 07/27/21 1640 07/28/21 0426 07/29/21 0519 07/31/21 0527  AST 57* 56* 41 35  ALT 34 29 27 34  ALKPHOS 73 63 52 67  BILITOT 1.0 0.8 0.8 0.5  PROT 7.3 6.2* 5.6* 5.8*  ALBUMIN 3.8 3.1* 2.7* 2.6*    No results for input(s): LIPASE, AMYLASE in the last 168 hours. Recent Labs  Lab 07/28/21 0005 07/29/21 1028  AMMONIA 38* 37*    Coagulation Profile: Recent Labs  Lab 07/27/21 1640  INR 1.0    Cardiac Enzymes: Recent Labs  Lab 07/27/21 1640 07/28/21 0426 07/28/21 1228 07/30/21 0515 08/01/21 0522  CKTOTAL 1,200* 1,169* 1,007* 523* 204    BNP (last 3 results) Recent Labs    06/18/21 1648  PROBNP 56.0    HbA1C: No results for input(s): HGBA1C in the last 72 hours. CBG: Recent Labs  Lab 07/29/21 0915  GLUCAP 137*    Lipid Profile: No results for input(s): CHOL, HDL, LDLCALC, TRIG, CHOLHDL, LDLDIRECT in the last 72 hours. Thyroid Function Tests: No results for input(s): TSH, T4TOTAL, FREET4, T3FREE, THYROIDAB in the last 72 hours. Anemia Panel: No results for input(s): VITAMINB12, FOLATE, FERRITIN, TIBC, IRON, RETICCTPCT in the last 72  hours. Urine analysis:    Component Value Date/Time   COLORURINE YELLOW 07/27/2021 2053   APPEARANCEUR CLEAR 07/27/2021 2053   LABSPEC 1.009 07/27/2021 2053   PHURINE 6.0 07/27/2021 2053   GLUCOSEU NEGATIVE 07/27/2021 2053   GLUCOSEU NEGATIVE 08/01/2020 1620   HGBUR MODERATE (A) 07/27/2021 2053   BILIRUBINUR NEGATIVE 07/27/2021 2053   KETONESUR 5 (A) 07/27/2021 2053   PROTEINUR NEGATIVE 07/27/2021 2053   UROBILINOGEN 0.2 08/01/2020 1620   NITRITE NEGATIVE 07/27/2021 2053   LEUKOCYTESUR NEGATIVE 07/27/2021 2053   Sepsis Labs: @LABRCNTIP (procalcitonin:4,lacticidven:4)  ) Recent Results (from the past 240 hour(s))  Blood culture (routine x 2)     Status: None   Collection Time: 07/27/21  4:40 PM   Specimen: BLOOD  Result Value Ref Range Status   Specimen Description   Final    BLOOD SITE NOT SPECIFIED Performed at [facility], [place] [address], [place], [place] [address]    Special Requests   Final    BOTTLES DRAWN AEROBIC AND ANAEROBIC Blood Culture adequate volume Performed at [facility] [address] [place], [place] [address]    Culture   Final    NO GROWTH 5 DAYS Performed at [facility], [place] [address] [place], [place] [address]    Report Status 08/01/2021 FINAL  Final  Blood culture (routine x 2)     Status: None   Collection Time: 07/27/21  4:40 PM   Specimen: BLOOD RIGHT FOREARM  Result Value Ref Range Status   Specimen Description   Final    BLOOD RIGHT FOREARM Performed at [facility], [place] [address] [place], [place] [address]    Special Requests   Final    BOTTLES DRAWN AEROBIC AND ANAEROBIC Blood Culture results may not  be optimal due to an excessive volume of blood received in culture bottles Performed at [facility] [address], [place], [place] [address]    Culture   Final    NO GROWTH 5 DAYS Performed at [facility], [place] [address], [place], [place] [address]     Report Status 08/01/2021 FINAL  Final  Resp Panel by RT-PCR (Flu A&B, Covid) Nasopharyngeal Swab     Status: None   Collection Time: 07/27/21  4:54 PM   Specimen: Nasopharyngeal Swab; Nasopharyngeal(NP) swabs in vial transport medium  Result Value Ref Range Status   SARS Coronavirus 2 by RT PCR NEGATIVE NEGATIVE Final    Comment: (NOTE) SARS-CoV-2 target nucleic acids are NOT DETECTED.  The SARS-CoV-2 RNA is generally detectable in upper respiratory specimens during the acute phase of infection. The lowest concentration of SARS-CoV-2 viral copies this assay can detect is 138 copies/mL. A negative result does not preclude SARS-Cov-2 infection and should not be used as the sole basis for treatment or other patient management decisions. A negative result may occur with  improper specimen collection/handling, submission of specimen other than nasopharyngeal swab, presence of viral mutation(s) within the areas targeted by this assay, and inadequate number of viral copies(<138 copies/mL). A negative result must be combined with clinical observations, patient history, and epidemiological information. The expected result is Negative.  Fact Sheet for Patients:  EntrepreneurPulse.com.au  Fact Sheet for Healthcare Providers:  IncredibleEmployment.be  This test is no t yet approved or cleared by the [place] FDA and  has been authorized for detection and/or diagnosis of SARS-CoV-2 by FDA under an Emergency Use Authorization (EUA). This EUA will remain  in effect (meaning this test can be used) for the duration of the COVID-19 declaration under Section 564(b)(1) of the Act, 21 U.S.C.section 360bbb-3(b)(1), unless the authorization is terminated  or revoked sooner.       Influenza A by PCR NEGATIVE NEGATIVE Final   Influenza B by PCR NEGATIVE NEGATIVE Final    Comment: (NOTE) The Xpert Xpress SARS-CoV-2/FLU/RSV plus assay is intended as an aid in  the diagnosis of influenza from Nasopharyngeal swab specimens and should not be used as a sole basis for treatment. Nasal washings and aspirates are unacceptable for Xpert Xpress SARS-CoV-2/FLU/RSV testing.  Fact Sheet for Patients: EntrepreneurPulse.com.au  Fact Sheet for Healthcare Providers: IncredibleEmployment.be  This test is not yet approved or cleared by the [place] FDA and has been authorized for detection and/or diagnosis of SARS-CoV-2 by FDA under an Emergency Use Authorization (EUA). This EUA will remain in effect (meaning this test can be used) for the duration of the COVID-19 declaration under Section 564(b)(1) of the Act, 21 U.S.C. section 360bbb-3(b)(1), unless the authorization is terminated or revoked.  Performed at [facility], [place] [address] [place], [place] [address]   Urine Culture     Status: None   Collection Time: 07/27/21  8:54 PM   Specimen: In/Out Cath Urine  Result Value Ref Range Status   Specimen Description   Final    IN/OUT CATH URINE Performed at [facility] [address], [place], [place] [address]    Special Requests   Final    NONE Performed at [facility], [place] [address] [place], [place] [address]    Culture   Final    NO GROWTH Performed at [facility], [place] [address] [place], [place] [address]    Report Status 07/28/2021  FINAL  Final  Culture, blood (routine x 2)     Status: None (Preliminary result)   Collection Time: 07/29/21  3:37 PM   Specimen: BLOOD LEFT WRIST  Result Value Ref Range Status   Specimen Description   Final    BLOOD LEFT WRIST Performed at [facility] [address] [place], [place] [address]    Special Requests   Final    BOTTLES DRAWN AEROBIC ONLY Blood Culture results may not be optimal due to an inadequate volume of blood received in culture bottles Performed at [facility] [address] [place], [place] [address]    Culture   Final    NO GROWTH 3 DAYS Performed at [facility], [place] [address] [place], [place] [address]    Report Status PENDING  Incomplete  Culture, blood (routine x 2)     Status: None (Preliminary result)   Collection Time: 07/29/21  3:38 PM   Specimen: BLOOD  Result Value Ref Range Status   Specimen Description   Final    BLOOD LEFT ANTECUBITAL Performed at [facility] [address], [place], [place] [address]    Special Requests   Final    BOTTLES DRAWN AEROBIC ONLY Blood Culture results may not be optimal due to an inadequate volume of blood received in culture bottles Performed at [facility] [address], [place], [place] [address]    Culture   Final    NO GROWTH 3 DAYS Performed at [facility], [place] [address], [place], [place] [address]    Report Status PENDING  Incomplete  Respiratory (~20 pathogens) panel by PCR     Status: None   Collection Time: 07/29/21  5:46 PM   Specimen: Nasopharyngeal Swab; Respiratory  Result Value Ref Range Status   Adenovirus NOT DETECTED NOT DETECTED Final   Coronavirus 229E NOT DETECTED NOT DETECTED Final    Comment: (NOTE) The Coronavirus on the Respiratory Panel, DOES NOT test for the novel  Coronavirus (2019 nCoV)    Coronavirus HKU1 NOT DETECTED NOT DETECTED Final   Coronavirus NL63 NOT DETECTED NOT DETECTED Final   Coronavirus OC43 NOT DETECTED NOT DETECTED Final   Metapneumovirus NOT DETECTED NOT DETECTED Final   Rhinovirus / Enterovirus NOT DETECTED NOT DETECTED Final   Influenza A NOT DETECTED NOT DETECTED Final   Influenza B NOT DETECTED NOT DETECTED Final   Parainfluenza Virus 1 NOT DETECTED NOT DETECTED Final   Parainfluenza Virus 2 NOT DETECTED NOT DETECTED Final   Parainfluenza Virus 3 NOT DETECTED NOT DETECTED Final   Parainfluenza Virus 4 NOT DETECTED NOT DETECTED Final   Respiratory  Syncytial Virus NOT DETECTED NOT DETECTED Final   Bordetella pertussis NOT DETECTED NOT DETECTED Final   Bordetella Parapertussis NOT DETECTED NOT DETECTED Final   Chlamydophila pneumoniae NOT DETECTED NOT DETECTED Final   Mycoplasma pneumoniae NOT DETECTED NOT DETECTED Final    Comment: Performed at [facility], [place]. [address], [place], [place] [address]  CSF culture w Gram Stain     Status: None (Preliminary result)   Collection Time: 07/30/21 12:30 PM   Specimen: CSF; Cerebrospinal Fluid  Result Value Ref Range Status   Specimen Description   Final    CSF Performed at [facility] [address], [place], [place] [address]    Special Requests   Final    NONE Performed at [facility], [place]  [address], [place], [place] [address]    Gram Stain   Final    WBC PRESENT, PREDOMINANTLY MONONUCLEAR NO ORGANISMS SEEN CYTOSPIN SMEAR Gram Stain Report Called to,Read Back By and Verified With: B.CATES LAND, RN AT 7672 ON 10.05.22 BY N.THOMPSON Performed at [facility] [address], [place], [place] [address]    Culture   Final    NO GROWTH 3 DAYS Performed at [facility], [place] [address], [place], [place] [address]    Report Status PENDING  Incomplete  Culture, fungus without smear     Status: None (Preliminary result)   Collection Time: 07/30/21 12:30 PM   Specimen: CSF; Cerebrospinal Fluid  Result Value Ref Range Status   Specimen Description   Final    CSF Performed at [facility] [address], [place], [place] [address]    Special Requests   Final    NONE Performed at [facility], [place] [address], [place], [place] [address]    Culture   Final    NO GROWTH 2 DAYS Performed at [facility] [address], [place], [place] [address]    Report Status PENDING  Incomplete      Studies: No results found.  Scheduled Meds:  aspirin EC  81 mg Oral q morning    enoxaparin (LOVENOX) injection  30 mg Subcutaneous Q24H   feeding supplement  237 mL Oral BID BM   mometasone-formoterol  2 puff Inhalation BID   montelukast  10 mg Oral QHS   multivitamin with minerals  1 tablet Oral Daily   pantoprazole  40 mg Oral Daily   predniSONE  6 mg Oral Q breakfast    Continuous Infusions:  sodium chloride 100 mL/hr at 08/01/21 1650     LOS: 4 days     Cristal Deer, MD [facility]  To reach me or the doctor on call, go to: [email or web address] Password [email or web address]  08/02/2021, 10:04 AM

## 2021-08-02 NOTE — Plan of Care (Signed)
  Problem: Education: Goal: Knowledge of General Education information will improve Description: Including pain rating scale, medication(s)/side effects and non-pharmacologic comfort measures Outcome: Progressing   Problem: Health Behavior/Discharge Planning: Goal: Ability to manage health-related needs will improve Outcome: Progressing   Problem: Clinical Measurements: Goal: Ability to maintain clinical measurements within normal limits will improve Outcome: Progressing Goal: Will remain free from infection Outcome: Progressing Goal: Diagnostic test results will improve Outcome: Progressing Goal: Respiratory complications will improve Outcome: Progressing Goal: Cardiovascular complication will be avoided Outcome: Progressing   Problem: Coping: Goal: Level of anxiety will decrease Outcome: Progressing   Problem: Elimination: Goal: Will not experience complications related to bowel motility Outcome: Progressing Goal: Will not experience complications related to urinary retention Outcome: Progressing   Problem: Pain Managment: Goal: General experience of comfort will improve Outcome: Progressing   Problem: Skin Integrity: Goal: Risk for impaired skin integrity will decrease Outcome: Progressing   Problem: Safety: Goal: Ability to remain free from injury will improve Outcome: Progressing

## 2021-08-03 ENCOUNTER — Inpatient Hospital Stay (HOSPITAL_COMMUNITY): Payer: Medicare HMO

## 2021-08-03 DIAGNOSIS — N184 Chronic kidney disease, stage 4 (severe): Secondary | ICD-10-CM | POA: Diagnosis not present

## 2021-08-03 DIAGNOSIS — I10 Essential (primary) hypertension: Secondary | ICD-10-CM | POA: Diagnosis not present

## 2021-08-03 DIAGNOSIS — G8929 Other chronic pain: Secondary | ICD-10-CM | POA: Diagnosis not present

## 2021-08-03 DIAGNOSIS — R401 Stupor: Secondary | ICD-10-CM | POA: Diagnosis not present

## 2021-08-03 LAB — CULTURE, BLOOD (ROUTINE X 2)
Culture: NO GROWTH
Culture: NO GROWTH

## 2021-08-03 LAB — CSF CULTURE W GRAM STAIN: Culture: NO GROWTH

## 2021-08-03 NOTE — Progress Notes (Signed)
Pt is injury free, afebrile, alert, and oriented. VS within baseline. She denies pain, SOB, or acute changes. Will continue to monitor

## 2021-08-03 NOTE — Plan of Care (Signed)

## 2021-08-03 NOTE — Progress Notes (Signed)
PROGRESS NOTE  [name] FIE:[id number] DOB: [birth year] DOA: 07/27/2021 PCP: Biagio Borg, MD  HPI/Recap of past 54 hours: 85 year old female with HFrEF, hypertension, chronic kidney disease stage III, who was admitted with metabolic encephalopathy after being found down.  Head CT was negative there was no evidence of UTI.  Mental status has improved somewhat but still confused.  Her Neurontin and Ultram have been held with improvement  Subjective: August 01, 2021, Patient seen and examined at bedside her friend [name] she says she is a good friend is at bedside.  August 03, 2019 General: Patient seen and examined at bedside.  She was on the phone with her daughter She denies any complaint and there was no new event overnight nurse noted that patient is some not able to ambulate and she is even having a lot of difficulty rolling over she is more weak on the right side.  Patient had physical therapy evaluation on July 31, 2021 had recommended for SNF or 24-hour supervision/assistance  August 03, 2021: Patient seen and examined at bedside, her daughter [name] is at bedside Patient is doing better.  She was able to sit on the recliner for couple of hours today nurse noted that she was able to stand with a walker but could not walk.  Also patient complaining of pain in both feet particularly the left this might be what is impairing her ambulation. [name] noted that she would like for her mother to go to the rehab and she is going to look out a number of places in [place] tomorrow which is Monday  Assessment/Plan: Principal Problem:   Altered mental status Active Problems:   Essential hypertension   CKD (chronic kidney disease) stage 4, GFR 15-29 ml/min (HCC)   Chronic pain   Long term (current) use of systemic steroids   Chronic diastolic CHF (congestive heart failure) ([place])   AMS (altered mental status)   Metabolic encephalopathy   Sepsis with encephalopathy without septic  shock ([place]) Metabolic encephalopathy. sepsis Neurology following LP done and CSF reveals only mild elevation of protein which is nonspecific but can be seen in viral infection Continue acyclovir until HSV from CSF results are negative Still holding Neurontin and Ultram Blood culture was negative x5 days CSF culture is also negative x3 days CSF HSV-1 DNA PCR is negative discontinue acyclovir CSF fungal culture was negative Neurology has signed off for now ID has signed off   2.  Hypokalemia we will replete  3.  Leukocytosis no clear infectious etiology leukocyte is improving it is 12.9 today  7.  Elevated CK was thought to be due to patient being down when she was found She was treated with IV fluid cautiously given her history of HFpEF We will continue to monitor particularly renal function  6.  HFpEF Patient appears euvolemic  7.  CKD4. Patient IV hydrated likely exacerbation from her elevated CK Creatinine is improved from 1.24-1.1 today  we will continue to monitor  8.  Foot pain.  This might be impairing her ability to ambulate I will get x-ray of the left foot patient encouraged to take her medication for pain which she stated she was not in pain body hurts or sore  Code Status: Full  Severity of Illness: The appropriate patient status for this patient is INPATIENT. Inpatient status is judged to be reasonable and necessary in order to provide the required intensity of service to ensure the patient's safety. The patient's presenting symptoms, physical exam findings,  and initial radiographic and laboratory data in the context of their chronic comorbidities is felt to place them at high risk for further clinical deterioration. Furthermore, it is not anticipated that the patient will be medically stable for discharge from the hospital within 2 midnights of admission. The following factors support the patient status of inpatient.  Patient still confused with  encephalopathy   * I certify that at the point of admission it is my clinical judgment that the patient will require inpatient hospital care spanning beyond 2 midnights from the point of admission due to high intensity of service, high risk for further deterioration and high frequency of surveillance required.*   Family Communication: October 7: Good friend and at bedside At bedside October 8: I had a lengthy discussion with her daughter [name] over the phone who was on her way to the room she said she was stuck in traffic downstairs did not have any way to park.  I updated her on patient's progress she has specific questions about certain lab values which I discussed with her. October 9: [name] her daughter at bedside   Disposition Plan: To SNF Status is: Inpatient   Dispo: The patient is from: Home              Anticipated d/c is to: Patient likely going to need SNF/rehab              Anticipated d/c date is: To be determined              Patient currently not medically stable for discharge  Consultants: Neurology Infectious disease  Procedures: None  Antimicrobials: None Acyclovir  DVT prophylaxis: Lovenox   Objective: Vitals:   08/02/21 1956 08/02/21 2300 08/03/21 0600 08/03/21 0811  BP:  (!) 147/69 (!) 156/74   Pulse:  98 73   Resp:  20    Temp:  97.8 F (36.6 C) 98.1 F (36.7 C)   TempSrc:  Oral Oral   SpO2: 92% 96% 97% 97%  Weight:      Height:        Intake/Output Summary (Last 24 hours) at 08/03/2021 1306 Last data filed at 08/03/2021 0600 Gross per 24 hour  Intake 720 ml  Output 200 ml  Net 520 ml    Filed Weights   07/27/21 1626  Weight: 70.3 kg   Body mass index is 30.27 kg/m.  Exam:  General: 85 y.o. year-old female well developed well nourished in no acute distress.  Alert and oriented x3. Cardiovascular: Regular rate and rhythm with no rubs or gallops.  No thyromegaly or JVD noted.   Respiratory: Clear to auscultation with no wheezes or  rales. Good inspiratory effort. Abdomen: Soft nontender nondistended with normal bowel sounds x4 quadrants. Musculoskeletal: +lower extremity edema. 2/4 pulses in all 4 extremities. Skin: No ulcerative lesions noted or rashes, Psychiatry: Mood is appropriate for condition and setting Neurology: Patient still a little confused she knows her name was not able to say where she was she states that she was in her kitchen and or her living room she could not say she was in the hospital.    Data Reviewed: CBC: Recent Labs  Lab 07/27/21 1640 07/28/21 0426 07/29/21 0519 07/30/21 0515 07/31/21 0527 08/01/21 0522 08/02/21 0556  WBC 12.6*   < > 16.6* 29.8* 21.0* 15.1* 12.9*  NEUTROABS 10.8*  --   --   --   --  11.7* 9.9*  HGB 12.0   < > 9.3* 9.2* 9.2*  9.2* 9.4*  HCT 38.2   < > 28.8* 28.5* 29.1* 28.9* 29.9*  MCV 100.8*   < > 99.3 101.1* 100.7* 100.7* 100.3*  PLT 273   < > 188 173 188 198 200   < > = values in this interval not displayed.    Basic Metabolic Panel: Recent Labs  Lab 07/28/21 0005 07/28/21 0426 07/29/21 0519 07/30/21 0515 07/31/21 0527 08/01/21 0522 08/02/21 0556  NA  --    < > 143 141 138 140 146*  K  --    < > 3.4* 4.4 3.2* 3.8 3.8  CL  --    < > 111 107 106 109 112*  CO2  --    < > 25 24 22 22 24   GLUCOSE  --    < > 137* 163* 108* 98 113*  BUN  --    < > 28* 34* 24* 18 18  CREATININE  --    < > 1.61* 1.62* 1.25* 1.20* 1.10*  CALCIUM  --    < > 9.0 8.8* 8.4* 9.2 9.0  MG 1.3*  --   --  1.7 1.6*  --   --    < > = values in this interval not displayed.    GFR: Estimated Creatinine Clearance: 33.9 mL/min (A) (by C-G formula based on SCr of 1.1 mg/dL (H)). Liver Function Tests: Recent Labs  Lab 07/27/21 1640 07/28/21 0426 07/29/21 0519 07/31/21 0527  AST 57* 56* 41 35  ALT 34 29 27 34  ALKPHOS 73 63 52 67  BILITOT 1.0 0.8 0.8 0.5  PROT 7.3 6.2* 5.6* 5.8*  ALBUMIN 3.8 3.1* 2.7* 2.6*    No results for input(s): LIPASE, AMYLASE in the last 168  hours. Recent Labs  Lab 07/28/21 0005 07/29/21 1028  AMMONIA 38* 37*    Coagulation Profile: Recent Labs  Lab 07/27/21 1640  INR 1.0    Cardiac Enzymes: Recent Labs  Lab 07/27/21 1640 07/28/21 0426 07/28/21 1228 07/30/21 0515 08/01/21 0522  CKTOTAL 1,200* 1,169* 1,007* 523* 204    BNP (last 3 results) Recent Labs    06/18/21 1648  PROBNP 56.0    HbA1C: No results for input(s): HGBA1C in the last 72 hours. CBG: Recent Labs  Lab 07/29/21 0915  GLUCAP 137*    Lipid Profile: No results for input(s): CHOL, HDL, LDLCALC, TRIG, CHOLHDL, LDLDIRECT in the last 72 hours. Thyroid Function Tests: No results for input(s): TSH, T4TOTAL, FREET4, T3FREE, THYROIDAB in the last 72 hours. Anemia Panel: No results for input(s): VITAMINB12, FOLATE, FERRITIN, TIBC, IRON, RETICCTPCT in the last 72 hours. Urine analysis:    Component Value Date/Time   COLORURINE YELLOW 07/27/2021 2053   APPEARANCEUR CLEAR 07/27/2021 2053   LABSPEC 1.009 07/27/2021 2053   PHURINE 6.0 07/27/2021 2053   GLUCOSEU NEGATIVE 07/27/2021 2053   GLUCOSEU NEGATIVE 08/01/2020 1620   HGBUR MODERATE (A) 07/27/2021 2053   BILIRUBINUR NEGATIVE 07/27/2021 2053   KETONESUR 5 (A) 07/27/2021 2053   PROTEINUR NEGATIVE 07/27/2021 2053   UROBILINOGEN 0.2 08/01/2020 1620   NITRITE NEGATIVE 07/27/2021 2053   LEUKOCYTESUR NEGATIVE 07/27/2021 2053   Sepsis Labs: @LABRCNTIP (procalcitonin:4,lacticidven:4)  ) Recent Results (from the past 240 hour(s))  Blood culture (routine x 2)     Status: None   Collection Time: 07/27/21  4:40 PM   Specimen: BLOOD  Result Value Ref Range Status   Specimen Description   Final    BLOOD SITE NOT SPECIFIED Performed at [facility], [place]  [address], [place], [place] [address]    Special Requests   Final    BOTTLES DRAWN AEROBIC AND ANAEROBIC Blood Culture adequate volume Performed at [facility] [address], [place], [place]  [address]    Culture   Final    NO GROWTH 5 DAYS Performed at [facility], [place] [address], [place], [place] [address]    Report Status 08/01/2021 FINAL  Final  Blood culture (routine x 2)     Status: None   Collection Time: 07/27/21  4:40 PM   Specimen: BLOOD RIGHT FOREARM  Result Value Ref Range Status   Specimen Description   Final    BLOOD RIGHT FOREARM Performed at [facility], [place] [address], [place], [place] [address]    Special Requests   Final    BOTTLES DRAWN AEROBIC AND ANAEROBIC Blood Culture results may not be optimal due to an excessive volume of blood received in culture bottles Performed at [facility] [address] [place], [place] [address]    Culture   Final    NO GROWTH 5 DAYS Performed at [facility], [place] [address], [place],  [address]    Report Status 08/01/2021 FINAL  Final  Resp Panel by RT-PCR (Flu A&B, Covid) Nasopharyngeal Swab     Status: None   Collection Time: 07/27/21  4:54 PM   Specimen: Nasopharyngeal Swab; Nasopharyngeal(NP) swabs in vial transport medium  Result Value Ref Range Status   SARS Coronavirus 2 by RT PCR NEGATIVE NEGATIVE Final    Comment: (NOTE) SARS-CoV-2 target nucleic acids are NOT DETECTED.  The SARS-CoV-2 RNA is generally detectable in upper respiratory specimens during the acute phase of infection. The lowest concentration of SARS-CoV-2 viral copies this assay can detect is 138 copies/mL. A negative result does not preclude SARS-Cov-2 infection and should not be used as the sole basis for treatment or other patient management decisions. A negative result may occur with  improper specimen collection/handling, submission of specimen other than nasopharyngeal swab, presence of viral mutation(s) within the areas targeted by this assay, and inadequate number of viral copies(<138 copies/mL). A negative result must be combined with clinical observations, patient history, and  epidemiological information. The expected result is Negative.  Fact Sheet for Patients:  EntrepreneurPulse.com.au  Fact Sheet for Healthcare Providers:  IncredibleEmployment.be  This test is no t yet approved or cleared by the [place] FDA and  has been authorized for detection and/or diagnosis of SARS-CoV-2 by FDA under an Emergency Use Authorization (EUA). This EUA will remain  in effect (meaning this test can be used) for the duration of the COVID-19 declaration under Section 564(b)(1) of the Act, 21 U.S.C.section 360bbb-3(b)(1), unless the authorization is terminated  or revoked sooner.       Influenza A by PCR NEGATIVE NEGATIVE Final   Influenza B by PCR NEGATIVE NEGATIVE Final    Comment: (NOTE) The Xpert Xpress SARS-CoV-2/FLU/RSV plus assay is intended as an aid in the diagnosis of influenza from Nasopharyngeal swab specimens and should not be used as a sole basis for treatment. Nasal washings and aspirates are unacceptable for Xpert Xpress SARS-CoV-2/FLU/RSV testing.  Fact Sheet for Patients: EntrepreneurPulse.com.au  Fact Sheet for Healthcare Providers: IncredibleEmployment.be  This test is not yet approved or cleared by the [place] FDA and has been authorized for detection and/or diagnosis of SARS-CoV-2 by FDA under an Emergency Use Authorization (EUA). This EUA will remain in effect (meaning this test  can be used) for the duration of the COVID-19 declaration under Section 564(b)(1) of the Act, 21 U.S.C. section 360bbb-3(b)(1), unless the authorization is terminated or revoked.  Performed at [facility], [place] [address] [place], [place] [address]   Urine Culture     Status: None   Collection Time: 07/27/21  8:54 PM   Specimen: In/Out Cath Urine  Result Value Ref Range Status   Specimen Description   Final    IN/OUT CATH URINE Performed at [facility] [address], [place], [place] [address]    Special Requests   Final    NONE Performed at [facility], [place] [address] [place], [place] [address]    Culture   Final    NO GROWTH Performed at [facility], [place] [address] [place], [place] [address]    Report Status 07/28/2021 FINAL  Final  Culture, blood (routine x 2)     Status: None   Collection Time: 07/29/21  3:37 PM   Specimen: BLOOD LEFT WRIST  Result Value Ref Range Status   Specimen Description   Final    BLOOD LEFT WRIST Performed at [facility] [address] [place], [place] [address]    Special Requests   Final    BOTTLES DRAWN AEROBIC ONLY Blood Culture results may not be optimal due to an inadequate volume of blood received in culture bottles Performed at [facility] [address], [place], [place] [address]    Culture   Final    NO GROWTH 5 DAYS Performed at [facility], [place] [address], [place], [place] [address]    Report Status 08/03/2021 FINAL  Final  Culture, blood (routine x 2)     Status: None   Collection Time: 07/29/21  3:38 PM   Specimen: BLOOD  Result Value Ref Range Status   Specimen Description   Final    BLOOD LEFT ANTECUBITAL Performed at [facility] [address], [place], [place] [address]    Special Requests   Final    BOTTLES DRAWN AEROBIC ONLY Blood Culture results may not be optimal due to an inadequate volume of blood received in culture bottles Performed at [facility] [address] [place], [place] [address]    Culture   Final    NO GROWTH 5 DAYS Performed at [facility], [place] [address], [place], [place] [address]    Report Status 08/03/2021 FINAL  Final  Respiratory (~20 pathogens) panel by PCR     Status: None   Collection Time: 07/29/21  5:46 PM   Specimen: Nasopharyngeal Swab; Respiratory  Result Value Ref Range Status    Adenovirus NOT DETECTED NOT DETECTED Final   Coronavirus 229E NOT DETECTED NOT DETECTED Final    Comment: (NOTE) The Coronavirus on the Respiratory Panel, DOES NOT test for the novel  Coronavirus (2019 nCoV)    Coronavirus HKU1 NOT DETECTED NOT DETECTED Final   Coronavirus NL63 NOT DETECTED NOT DETECTED Final   Coronavirus OC43 NOT DETECTED NOT DETECTED Final   Metapneumovirus NOT DETECTED NOT DETECTED Final   Rhinovirus / Enterovirus NOT DETECTED NOT DETECTED Final   Influenza A NOT DETECTED NOT DETECTED Final   Influenza B NOT DETECTED NOT DETECTED Final   Parainfluenza Virus 1 NOT DETECTED NOT DETECTED Final   Parainfluenza Virus 2 NOT DETECTED NOT DETECTED Final   Parainfluenza Virus 3 NOT DETECTED NOT DETECTED  Final   Parainfluenza Virus 4 NOT DETECTED NOT DETECTED Final   Respiratory Syncytial Virus NOT DETECTED NOT DETECTED Final   Bordetella pertussis NOT DETECTED NOT DETECTED Final   Bordetella Parapertussis NOT DETECTED NOT DETECTED Final   Chlamydophila pneumoniae NOT DETECTED NOT DETECTED Final   Mycoplasma pneumoniae NOT DETECTED NOT DETECTED Final    Comment: Performed at [facility], [place] [address], [place], [place] [address]  CSF culture w Gram Stain     Status: None   Collection Time: 07/30/21 12:30 PM   Specimen: CSF; Cerebrospinal Fluid  Result Value Ref Range Status   Specimen Description   Final    CSF Performed at [facility] [address] [place], [place] [address]    Special Requests   Final    NONE Performed at [facility], [place] [address], [place], [place] [address]    Gram Stain   Final    WBC PRESENT, PREDOMINANTLY MONONUCLEAR NO ORGANISMS SEEN CYTOSPIN SMEAR Gram Stain Report Called to,Read Back By and Verified With: B.CATES LAND, RN AT 2297 ON 10.05.22 BY N.THOMPSON Performed at [facility] [address], [place], [place] [address]    Culture   Final    NO GROWTH 3  DAYS Performed at [facility], [place] [address] [place], [place] [address]    Report Status 08/03/2021 FINAL  Final  Culture, fungus without smear     Status: None (Preliminary result)   Collection Time: 07/30/21 12:30 PM   Specimen: CSF; Cerebrospinal Fluid  Result Value Ref Range Status   Specimen Description   Final    CSF Performed at [facility] [address] [place], [place] [address]    Special Requests   Final    NONE Performed at [facility], [place] [address], [place], [place] [address]    Culture   Final    NO FUNGUS ISOLATED AFTER 4 DAYS Performed at [facility], [place] [address] [place], [place] [address]    Report Status PENDING  Incomplete      Studies: No results found.  Scheduled Meds:  aspirin EC  81 mg Oral q morning   enoxaparin (LOVENOX) injection  40 mg Subcutaneous Q24H   feeding supplement  237 mL Oral BID BM   mometasone-formoterol  2 puff Inhalation BID   montelukast  10 mg Oral QHS   multivitamin with minerals  1 tablet Oral Daily   pantoprazole  40 mg Oral Daily   predniSONE  6 mg Oral Q breakfast    Continuous Infusions:  sodium chloride 100 mL/hr at 08/03/21 0444     LOS: 5 days     Cristal Deer, MD [facility]  To reach me or the doctor on call, go to: [email or web address] Password [email or web address]  08/03/2021, 1:06 PM

## 2021-08-04 ENCOUNTER — Inpatient Hospital Stay (HOSPITAL_COMMUNITY): Payer: Medicare HMO

## 2021-08-04 DIAGNOSIS — I10 Essential (primary) hypertension: Secondary | ICD-10-CM | POA: Diagnosis not present

## 2021-08-04 DIAGNOSIS — N184 Chronic kidney disease, stage 4 (severe): Secondary | ICD-10-CM | POA: Diagnosis not present

## 2021-08-04 DIAGNOSIS — R401 Stupor: Secondary | ICD-10-CM | POA: Diagnosis not present

## 2021-08-04 DIAGNOSIS — G8929 Other chronic pain: Secondary | ICD-10-CM | POA: Diagnosis not present

## 2021-08-04 LAB — BASIC METABOLIC PANEL
Anion gap: 7 (ref 5–15)
BUN: 22 mg/dL (ref 8–23)
CO2: 22 mmol/L (ref 22–32)
Calcium: 8.5 mg/dL — ABNORMAL LOW (ref 8.9–10.3)
Chloride: 108 mmol/L (ref 98–111)
Creatinine, Ser: 1.26 mg/dL — ABNORMAL HIGH (ref 0.44–1.00)
GFR, Estimated: 42 mL/min — ABNORMAL LOW (ref >60)
Glucose, Bld: 121 mg/dL — ABNORMAL HIGH (ref 70–99)
Potassium: 3.9 mmol/L (ref 3.5–5.1)
Sodium: 137 mmol/L (ref 135–145)

## 2021-08-04 LAB — CBC
HCT: 26.4 % — ABNORMAL LOW (ref 36.0–46.0)
Hemoglobin: 8.4 g/dL — ABNORMAL LOW (ref 12.0–15.0)
MCH: 32.2 pg (ref 26.0–34.0)
MCHC: 31.8 g/dL (ref 30.0–36.0)
MCV: 101.1 fL — ABNORMAL HIGH (ref 80.0–100.0)
Platelets: 210 10*3/uL (ref 150–400)
RBC: 2.61 MIL/uL — ABNORMAL LOW (ref 3.87–5.11)
RDW: 14.5 % (ref 11.5–15.5)
WBC: 12.7 10*3/uL — ABNORMAL HIGH (ref 4.0–10.5)
nRBC: 0.2 % (ref 0.0–0.2)

## 2021-08-04 LAB — URIC ACID: Uric Acid, Serum: 6.2 mg/dL (ref 2.5–7.1)

## 2021-08-04 MED ORDER — ENOXAPARIN SODIUM 30 MG/0.3ML IJ SOSY
30.0000 mg | PREFILLED_SYRINGE | INTRAMUSCULAR | Status: DC
  Administered 2021-08-04 – 2021-08-05 (×2): 30 mg via SUBCUTANEOUS
  Filled 2021-08-04 (×2): qty 0.3

## 2021-08-04 MED ORDER — IPRATROPIUM-ALBUTEROL 0.5-2.5 (3) MG/3ML IN SOLN
3.0000 mL | Freq: Once | RESPIRATORY_TRACT | Status: AC
  Administered 2021-08-04: 3 mL via RESPIRATORY_TRACT
  Filled 2021-08-04: qty 3

## 2021-08-04 NOTE — Care Management Important Message (Signed)
Important Message  Patient Details IM Letter given to the Patient. Name: [name] MRN: [id number] Date of Birth: 1935-11-27   Medicare Important Message Given:  Yes     Kerin Salen 08/04/2021, 10:17 AM

## 2021-08-04 NOTE — Progress Notes (Signed)
Physical Therapy Treatment Patient Details Name: [name] MRN: [id number] DOB: Jun 04, 1936 Today's Date: 08/04/2021   History of Present Illness Admitted 10/2 after syncopal episode and fall. CT clear for fracture or focal lesions, acute infarctions, hemmorrahge, hydrocephalus. PMH significant for CHF, HTN, and CKD stage 3-4.    PT Comments    Pt is progressing toward goals. Balance improving however continues to demonstrate very limited activity tolerance and requires frequent rests. VSS during PT session. Continue to recommend SNF   Recommendations for follow up therapy are one component of a multi-disciplinary discharge planning process, led by the attending physician.  Recommendations may be updated based on patient status, additional functional criteria and insurance authorization.  Follow Up Recommendations  SNF;Supervision/Assistance - 24 hour     Equipment Recommendations  None recommended by PT    Recommendations for Other Services       Precautions / Restrictions Precautions Precautions: Fall Precaution Comments: quickly incontinent, take a brief. (put it on before standing) states she gets vertigo easily Restrictions Weight Bearing Restrictions: No     Mobility  Bed Mobility Overal bed mobility: Needs Assistance Bed Mobility: Supine to Sit     Supine to sit: Mod assist;+2 for physical assistance     General bed mobility comments: requires  assist to bring LEs off bed and to push up into sitting, incr time needed. bed pad used to elevate trunk. pt reports she has frozen shoulders    Transfers Overall transfer level: Needs assistance Equipment used: Rolling walker (2 wheeled) Transfers: Sit to/from Stand Sit to Stand: Min assist         General transfer comment: assist to power up, cues for hand placement adn LE position. repeated x2, unable to don brief d/t immediate incontinence  Ambulation/Gait Ambulation/Gait assistance: Min assist;+2  safety/equipment Gait Distance (Feet): 14 Feet Assistive device: Rolling walker (2 wheeled) Gait Pattern/deviations: Step-to pattern;Shuffle     General Gait Details: pt with significant wheezing, SpO2=90s, HR 110-121. min assist to balance and move RW forward to take steps. seated rest break needed after amb above distance d/t incr WOB/DOE   Stairs             Wheelchair Mobility    Modified Rankin (Stroke Patients Only)       Balance   Sitting-balance support: Feet supported;Single extremity supported Sitting balance-Leahy Scale: Fair Sitting balance - Comments: Poor progressing to fair sitting EOB   Standing balance support: During functional activity;Bilateral upper extremity supported Standing balance-Leahy Scale: Poor Standing balance comment: reliant on support,   posterior lean --decr today                            Cognition Arousal/Alertness: Awake/alert Behavior During Therapy: WFL for tasks assessed/performed Overall Cognitive Status: Within Functional Limits for tasks assessed                                 General Comments: pt apporopriate this date, oriented. requires incr time to complete tasks      Exercises      General Comments        Pertinent Vitals/Pain Pain Assessment: No/denies pain    Home Living                      Prior Function            PT  Goals (current goals can now be found in the care plan section) Acute Rehab PT Goals Patient Stated Goal: To improve strength and be able to go home PT Goal Formulation: With patient Time For Goal Achievement: 08/05/21 Potential to Achieve Goals: Good Progress towards PT goals: Progressing toward goals    Frequency    Min 2X/week      PT Plan Current plan remains appropriate;Frequency needs to be updated    Co-evaluation              AM-PAC PT "6 Clicks" Mobility   Outcome Measure  Help needed turning from your back to your  side while in a flat bed without using bedrails?: A Lot Help needed moving from lying on your back to sitting on the side of a flat bed without using bedrails?: A Lot Help needed moving to and from a bed to a chair (including a wheelchair)?: A Lot Help needed standing up from a chair using your arms (e.g., wheelchair or bedside chair)?: A Lot Help needed to walk in hospital room?: A Lot Help needed climbing 3-5 steps with a railing? : Total 6 Click Score: 11    End of Session Equipment Utilized During Treatment: Gait belt Activity Tolerance: Patient limited by fatigue Patient left: in chair;with call bell/phone within reach;with chair alarm set Nurse Communication: Mobility status PT Visit Diagnosis: Other abnormalities of gait and mobility (R26.89);Muscle weakness (generalized) (M62.81);Difficulty in walking, not elsewhere classified (R26.2)     Time: 9432-7614 PT Time Calculation (min) (ACUTE ONLY): 31 min  Charges:  $Gait Training: 8-22 mins $Therapeutic Activity: 8-22 mins                     Baxter Flattery, PT  Acute Rehab Dept ([place]) [phone] Pager [phone]  08/04/2021    [facility] 08/04/2021, 11:01 AM

## 2021-08-04 NOTE — Progress Notes (Addendum)
PROGRESS NOTE  [name] DJS:[id number] DOB: [birth year] DOA: 07/27/2021 PCP: Biagio Borg, MD  HPI/Recap of past 44 hours: 85 year old female with HFrEF, hypertension, chronic kidney disease stage III, who was admitted with metabolic encephalopathy after being found down.  Head CT was negative there was no evidence of UTI.  Mental status has improved somewhat but still confused.  Her Neurontin and Ultram have been held with improvement  Subjective: August 01, 2021, Patient seen and examined at bedside her friend [name] she says she is a good friend is at bedside.  August 03, 2019 General: Patient seen and examined at bedside.  She was on the phone with her daughter She denies any complaint and there was no new event overnight nurse noted that patient is some not able to ambulate and she is even having a lot of difficulty rolling over she is more weak on the right side.  Patient had physical therapy evaluation on July 31, 2021 had recommended for SNF or 24-hour supervision/assistance  August 03, 2021: Patient seen and examined at bedside, her daughter [name] is at bedside Patient is doing better.  She was able to sit on the recliner for couple of hours today nurse noted that she was able to stand with a walker but could not walk.  Also patient complaining of pain in both feet particularly the left this might be what is impairing her ambulation. [name] noted that she would like for her mother to go to the rehab and she is going to look out a number of places in [place] tomorrow which is Monday  August 04, 2021: Patient seen and examined at bedside.  She is having a little bit of wheezing and shortness of breath.  She stated she has asthma  Assessment/Plan: Principal Problem:   Altered mental status Active Problems:   Essential hypertension   CKD (chronic kidney disease) stage 4, GFR 15-29 ml/min (HCC)   Chronic pain   Long term (current) use of systemic steroids    Chronic diastolic CHF (congestive heart failure) (HCC)   AMS (altered mental status)   Metabolic encephalopathy   Sepsis with encephalopathy without septic shock (HCC) Metabolic encephalopathy. sepsis Neurology following LP done and CSF reveals only mild elevation of protein which is nonspecific but can be seen in viral infection Continue acyclovir until HSV from CSF results are negative Still holding Neurontin and Ultram Blood culture was negative x5 days CSF culture is also negative x3 days CSF HSV-1 DNA PCR is negative discontinue acyclovir CSF fungal culture was negative Neurology has signed off for now ID has signed off   2.  Hypokalemia we will replete  3.  Leukocytosis no clear infectious etiology leukocyte is improving it is 12.9 today  7.  Elevated CK was thought to be due to patient being down when she was found She was treated with IV fluid cautiously given her history of HFpEF We will continue to monitor particularly renal function  6.  HFpEF Patient appears euvolemic  7.  CKD4. Patient IV hydrated likely exacerbation from her elevated CK Creatinine is improved from 1.26 today Continue to encourage p.o. fluid intake  we will continue to monitor  8.  Foot pain.  This might be impairing her ability to ambulate I will get x-ray of the left foot patient encouraged to take her medication for pain which she stated she was not in pain body hurts or sore X-ray of the foot reveals osteoarthritis I will get uric acid level  9.  Asthma with mild exacerbation today Well well give her her nebulizer treatment she is already on prednisone and montelukast and Dulera  Code Status: Full  Severity of Illness: The appropriate patient status for this patient is INPATIENT. Inpatient status is judged to be reasonable and necessary in order to provide the required intensity of service to ensure the patient's safety. The patient's presenting symptoms, physical exam findings, and  initial radiographic and laboratory data in the context of their chronic comorbidities is felt to place them at high risk for further clinical deterioration. Furthermore, it is not anticipated that the patient will be medically stable for discharge from the hospital within 2 midnights of admission. The following factors support the patient status of inpatient.  Patient still confused with encephalopathy   * I certify that at the point of admission it is my clinical judgment that the patient will require inpatient hospital care spanning beyond 2 midnights from the point of admission due to high intensity of service, high risk for further deterioration and high frequency of surveillance required.*   Family Communication: October 7: Good friend and at bedside At bedside October 8: I had a lengthy discussion with her daughter [name] over the phone who was on her way to the room she said she was stuck in traffic downstairs did not have any way to park.  I updated her on patient's progress she has specific questions about certain lab values which I discussed with her. October 9: [name] her daughter at bedside October 10: Updated her daughter [name] on over the phone  Disposition Plan: To SNF Status is: Inpatient   Dispo: The patient is from: Home              Anticipated d/c is to: Patient likely going to need SNF/rehab              Anticipated d/c date is: To be determined              Patient currently not medically stable for discharge  Consultants: Neurology Infectious disease  Procedures: None  Antimicrobials: None Acyclovir  DVT prophylaxis: Lovenox   Objective: Vitals:   08/03/21 2023 08/04/21 0452 08/04/21 0737 08/04/21 1120  BP:  (!) 145/76  (!) 141/74  Pulse:  77  95  Resp:  18  (!) 22  Temp:  99.1 F (37.3 C)  97.8 F (36.6 C)  TempSrc:  Oral  Oral  SpO2: 98% 95% 97% 96%  Weight:      Height:        Intake/Output Summary (Last 24 hours) at 08/04/2021 1434 Last  data filed at 08/04/2021 0500 Gross per 24 hour  Intake 220 ml  Output 650 ml  Net -430 ml    Filed Weights   07/27/21 1626  Weight: 70.3 kg   Body mass index is 30.27 kg/m.  Exam:  General: 85 y.o. year-old female well developed well nourished in no acute distress.  Alert and oriented x3.  Patient sitting in a recliner at the side of her bed Cardiovascular: Regular rate and rhythm with no rubs or gallops.  No thyromegaly or JVD noted.   Respiratory: Few expiratory wheezes ,no rales. Good inspiratory effort. Abdomen: Soft nontender nondistended with normal bowel sounds x4 quadrants. Musculoskeletal: +lower extremity edema. 2/4 pulses in all 4 extremities. Skin: No ulcerative lesions noted or rashes, Psychiatry: Mood is appropriate for condition and setting Neurology: Patient still a little confused   Data Reviewed: CBC: Recent Labs  Lab 07/30/21 0515 07/31/21 0527 08/01/21 0522 08/02/21 0556 08/04/21 0528  WBC 29.8* 21.0* 15.1* 12.9* 12.7*  NEUTROABS  --   --  11.7* 9.9*  --   HGB 9.2* 9.2* 9.2* 9.4* 8.4*  HCT 28.5* 29.1* 28.9* 29.9* 26.4*  MCV 101.1* 100.7* 100.7* 100.3* 101.1*  PLT 173 188 198 200 366    Basic Metabolic Panel: Recent Labs  Lab 07/30/21 0515 07/31/21 0527 08/01/21 0522 08/02/21 0556 08/04/21 0528  NA 141 138 140 146* 137  K 4.4 3.2* 3.8 3.8 3.9  CL 107 106 109 112* 108  CO2 24 22 22 24 22   GLUCOSE 163* 108* 98 113* 121*  BUN 34* 24* 18 18 22   CREATININE 1.62* 1.25* 1.20* 1.10* 1.26*  CALCIUM 8.8* 8.4* 9.2 9.0 8.5*  MG 1.7 1.6*  --   --   --     GFR: Estimated Creatinine Clearance: 29.6 mL/min (A) (by C-G formula based on SCr of 1.26 mg/dL (H)). Liver Function Tests: Recent Labs  Lab 07/29/21 0519 07/31/21 0527  AST 41 35  ALT 27 34  ALKPHOS 52 67  BILITOT 0.8 0.5  PROT 5.6* 5.8*  ALBUMIN 2.7* 2.6*    No results for input(s): LIPASE, AMYLASE in the last 168 hours. Recent Labs  Lab 07/29/21 1028  AMMONIA 37*     Coagulation Profile: No results for input(s): INR, PROTIME in the last 168 hours.  Cardiac Enzymes: Recent Labs  Lab 07/30/21 0515 08/01/21 0522  CKTOTAL 523* 204    BNP (last 3 results) Recent Labs    06/18/21 1648  PROBNP 56.0    HbA1C: No results for input(s): HGBA1C in the last 72 hours. CBG: Recent Labs  Lab 07/29/21 0915  GLUCAP 137*    Lipid Profile: No results for input(s): CHOL, HDL, LDLCALC, TRIG, CHOLHDL, LDLDIRECT in the last 72 hours. Thyroid Function Tests: No results for input(s): TSH, T4TOTAL, FREET4, T3FREE, THYROIDAB in the last 72 hours. Anemia Panel: No results for input(s): VITAMINB12, FOLATE, FERRITIN, TIBC, IRON, RETICCTPCT in the last 72 hours. Urine analysis:    Component Value Date/Time   COLORURINE YELLOW 07/27/2021 2053   APPEARANCEUR CLEAR 07/27/2021 2053   LABSPEC 1.009 07/27/2021 2053   PHURINE 6.0 07/27/2021 2053   GLUCOSEU NEGATIVE 07/27/2021 2053   GLUCOSEU NEGATIVE 08/01/2020 1620   HGBUR MODERATE (A) 07/27/2021 2053   BILIRUBINUR NEGATIVE 07/27/2021 2053   KETONESUR 5 (A) 07/27/2021 2053   PROTEINUR NEGATIVE 07/27/2021 2053   UROBILINOGEN 0.2 08/01/2020 1620   NITRITE NEGATIVE 07/27/2021 2053   LEUKOCYTESUR NEGATIVE 07/27/2021 2053   Sepsis Labs: @LABRCNTIP (procalcitonin:4,lacticidven:4)  ) Recent Results (from the past 240 hour(s))  Blood culture (routine x 2)     Status: None   Collection Time: 07/27/21  4:40 PM   Specimen: BLOOD  Result Value Ref Range Status   Specimen Description   Final    BLOOD SITE NOT SPECIFIED Performed at [facility], [place] [address], [place], [place] [address]    Special Requests   Final    BOTTLES DRAWN AEROBIC AND ANAEROBIC Blood Culture adequate volume Performed at [place] [address], [place], [place] [address]    Culture   Final    NO GROWTH 5 DAYS Performed at [facility], [place] [address], [place], [place] [address]     Report Status 08/01/2021 FINAL  Final  Blood culture (routine x 2)     Status: None   Collection Time: 07/27/21  4:40 PM   Specimen: BLOOD RIGHT FOREARM  Result Value Ref Range Status   Specimen Description   Final    BLOOD RIGHT FOREARM Performed at [facility], [place] [address] [place], [place] [address]    Special Requests   Final    BOTTLES DRAWN AEROBIC AND ANAEROBIC Blood Culture results may not be optimal due to an excessive volume of blood received in culture bottles Performed at [facility] [address], [place], [place] [address]    Culture   Final    NO GROWTH 5 DAYS Performed at [facility], [place] [address] [place], [place] [address]    Report Status 08/01/2021 FINAL  Final  Resp Panel by RT-PCR (Flu A&B, Covid) Nasopharyngeal Swab     Status: None   Collection Time: 07/27/21  4:54 PM   Specimen: Nasopharyngeal Swab; Nasopharyngeal(NP) swabs in vial transport medium  Result Value Ref Range Status   SARS Coronavirus 2 by RT PCR NEGATIVE NEGATIVE Final    Comment: (NOTE) SARS-CoV-2 target nucleic acids are NOT DETECTED.  The SARS-CoV-2 RNA is generally detectable in upper respiratory specimens during the acute phase of infection. The lowest concentration of SARS-CoV-2 viral copies this assay can detect is 138 copies/mL. A negative result does not preclude SARS-Cov-2 infection and should not be used as the sole basis for treatment or other patient management decisions. A negative result may occur with  improper specimen collection/handling, submission of specimen other than nasopharyngeal swab, presence of viral mutation(s) within the areas targeted by this assay, and inadequate number of viral copies(<138 copies/mL). A negative result must be combined with clinical observations, patient history, and epidemiological information. The expected result is Negative.  Fact Sheet for Patients:   EntrepreneurPulse.com.au  Fact Sheet for Healthcare Providers:  IncredibleEmployment.be  This test is no t yet approved or cleared by the [place] FDA and  has been authorized for detection and/or diagnosis of SARS-CoV-2 by FDA under an Emergency Use Authorization (EUA). This EUA will remain  in effect (meaning this test can be used) for the duration of the COVID-19 declaration under Section 564(b)(1) of the Act, 21 U.S.C.section 360bbb-3(b)(1), unless the authorization is terminated  or revoked sooner.       Influenza A by PCR NEGATIVE NEGATIVE Final   Influenza B by PCR NEGATIVE NEGATIVE Final    Comment: (NOTE) The Xpert Xpress SARS-CoV-2/FLU/RSV plus assay is intended as an aid in the diagnosis of influenza from Nasopharyngeal swab specimens and should not be used as a sole basis for treatment. Nasal washings and aspirates are unacceptable for Xpert Xpress SARS-CoV-2/FLU/RSV testing.  Fact Sheet for Patients: EntrepreneurPulse.com.au  Fact Sheet for Healthcare Providers: IncredibleEmployment.be  This test is not yet approved or cleared by the [place] FDA and has been authorized for detection and/or diagnosis of SARS-CoV-2 by FDA under an Emergency Use Authorization (EUA). This EUA will remain in effect (meaning this test can be used) for the duration of the COVID-19 declaration under Section 564(b)(1) of the Act, 21 U.S.C. section 360bbb-3(b)(1), unless the authorization is terminated or revoked.  Performed at [facility], [place] [address] [place], [place] [address]   Urine Culture     Status: None   Collection Time: 07/27/21  8:54 PM   Specimen: In/Out Cath Urine  Result Value Ref Range Status   Specimen Description   Final    IN/OUT CATH URINE Performed at [facility]  [address] [place], [place] [address]    Special Requests   Final     NONE Performed at [facility], [place] [address], [place], [place] [address]    Culture   Final    NO GROWTH Performed at [facility], [place] [address], [place], [place] [address]    Report Status 07/28/2021 FINAL  Final  Culture, blood (routine x 2)     Status: None   Collection Time: 07/29/21  3:37 PM   Specimen: BLOOD LEFT WRIST  Result Value Ref Range Status   Specimen Description   Final    BLOOD LEFT WRIST Performed at [place] [address], [place], [place] [address]    Special Requests   Final    BOTTLES DRAWN AEROBIC ONLY Blood Culture results may not be optimal due to an inadequate volume of blood received in culture bottles Performed at [place] [address], [place], [place] [address]    Culture   Final    NO GROWTH 5 DAYS Performed at [facility], [place] [address] [place], [place] [address]    Report Status 08/03/2021 FINAL  Final  Culture, blood (routine x 2)     Status: None   Collection Time: 07/29/21  3:38 PM   Specimen: BLOOD  Result Value Ref Range Status   Specimen Description   Final    BLOOD LEFT ANTECUBITAL Performed at [place] [address] [place], [place] [address]    Special Requests   Final    BOTTLES DRAWN AEROBIC ONLY Blood Culture results may not be optimal due to an inadequate volume of blood received in culture bottles Performed at [place] [address] [place], [place] [address]    Culture   Final    NO GROWTH 5 DAYS Performed at [facility], [place] [address] [place], [place] [address]    Report Status 08/03/2021 FINAL  Final  Respiratory (~20 pathogens) panel by PCR     Status: None   Collection Time: 07/29/21  5:46 PM   Specimen: Nasopharyngeal Swab; Respiratory  Result Value Ref Range Status   Adenovirus NOT DETECTED NOT DETECTED Final   Coronavirus 229E NOT DETECTED NOT DETECTED Final     Comment: (NOTE) The Coronavirus on the Respiratory Panel, DOES NOT test for the novel  Coronavirus (2019 nCoV)    Coronavirus HKU1 NOT DETECTED NOT DETECTED Final   Coronavirus NL63 NOT DETECTED NOT DETECTED Final   Coronavirus OC43 NOT DETECTED NOT DETECTED Final   Metapneumovirus NOT DETECTED NOT DETECTED Final   Rhinovirus / Enterovirus NOT DETECTED NOT DETECTED Final   Influenza A NOT DETECTED NOT DETECTED Final   Influenza B NOT DETECTED NOT DETECTED Final   Parainfluenza Virus 1 NOT DETECTED NOT DETECTED Final   Parainfluenza Virus 2 NOT DETECTED NOT DETECTED Final   Parainfluenza Virus 3 NOT DETECTED NOT DETECTED Final   Parainfluenza Virus 4 NOT DETECTED NOT DETECTED Final   Respiratory Syncytial Virus NOT DETECTED NOT DETECTED Final   Bordetella pertussis NOT DETECTED NOT DETECTED Final   Bordetella Parapertussis NOT DETECTED NOT DETECTED Final   Chlamydophila pneumoniae NOT DETECTED NOT DETECTED Final   Mycoplasma pneumoniae NOT DETECTED NOT DETECTED Final    Comment: Performed at [facility], [place] [address], [place],  [address]  CSF culture w Gram Stain     Status: None   Collection Time: 07/30/21 12:30 PM  Specimen: CSF; Cerebrospinal Fluid  Result Value Ref Range Status   Specimen Description   Final    CSF Performed at [place] [address], [place], [place] [address]    Special Requests   Final    NONE Performed at [facility], [place] [address], [place], [place] [address]    Gram Stain   Final    WBC PRESENT, PREDOMINANTLY MONONUCLEAR NO ORGANISMS SEEN CYTOSPIN SMEAR Gram Stain Report Called to,Read Back By and Verified With: B.CATES LAND, RN AT 0160 ON 10.05.22 BY N.THOMPSON Performed at [facility] [address], [place], [place] [address]    Culture   Final    NO GROWTH 3 DAYS Performed at [facility], [place] [address] [place], [place] [address]    Report Status  08/03/2021 FINAL  Final  Culture, fungus without smear     Status: None (Preliminary result)   Collection Time: 07/30/21 12:30 PM   Specimen: CSF; Cerebrospinal Fluid  Result Value Ref Range Status   Specimen Description   Final    CSF Performed at [place] [address], [place], [place] [address]    Special Requests   Final    NONE Performed at [facility], [place] [address] [place], [place] [address]    Culture   Final    NO FUNGUS ISOLATED AFTER 5 DAYS Performed at [facility], [place] [address], [place], [place] [address]    Report Status PENDING  Incomplete      Studies: DG Foot 2 Views Left  Result Date: 08/03/2021 CLINICAL DATA:  Generalized foot pain.  Unable to move foot. EXAM: LEFT FOOT - 2 VIEW COMPARISON:  08/20/2020 FINDINGS: Diffuse bone demineralization. Prominent hallux valgus deformity with degenerative changes in the first metatarsal-phalangeal joint. Degenerative changes also seen in the interphalangeal and intertarsal joints. The configuration of foot suggests valgus orientation with pes planus although this could be positional. No acute fractures identified. No focal bone erosion. Vascular calcifications in the soft tissues. IMPRESSION: Degenerative changes in the left foot. Hallux valgus deformity. Midfoot valgus and pes planus. No acute bony abnormalities. Electronically Signed   By: Lucienne Capers M.D.   On: 08/03/2021 20:32    Scheduled Meds:  aspirin EC  81 mg Oral q morning   enoxaparin (LOVENOX) injection  30 mg Subcutaneous Q24H   feeding supplement  237 mL Oral BID BM   ipratropium-albuterol  3 mL Nebulization Once   mometasone-formoterol  2 puff Inhalation BID   montelukast  10 mg Oral QHS   multivitamin with minerals  1 tablet Oral Daily   pantoprazole  40 mg Oral Daily   predniSONE  6 mg Oral Q breakfast    Continuous Infusions:  sodium chloride 10 mL/hr at 08/03/21 1716     LOS: 6 days      Cristal Deer, MD [facility]  To reach me or the doctor on call, go to: [email or web address] Password [email or web address]  08/04/2021, 2:34 PM

## 2021-08-05 ENCOUNTER — Inpatient Hospital Stay (HOSPITAL_COMMUNITY): Payer: Medicare HMO

## 2021-08-05 DIAGNOSIS — R401 Stupor: Secondary | ICD-10-CM | POA: Diagnosis not present

## 2021-08-05 DIAGNOSIS — G8929 Other chronic pain: Secondary | ICD-10-CM | POA: Diagnosis not present

## 2021-08-05 DIAGNOSIS — N184 Chronic kidney disease, stage 4 (severe): Secondary | ICD-10-CM | POA: Diagnosis not present

## 2021-08-05 DIAGNOSIS — I10 Essential (primary) hypertension: Secondary | ICD-10-CM | POA: Diagnosis not present

## 2021-08-05 LAB — BASIC METABOLIC PANEL
Anion gap: 6 (ref 5–15)
BUN: 22 mg/dL (ref 8–23)
CO2: 25 mmol/L (ref 22–32)
Calcium: 8.9 mg/dL (ref 8.9–10.3)
Chloride: 107 mmol/L (ref 98–111)
Creatinine, Ser: 1.03 mg/dL — ABNORMAL HIGH (ref 0.44–1.00)
GFR, Estimated: 54 mL/min — ABNORMAL LOW (ref >60)
Glucose, Bld: 132 mg/dL — ABNORMAL HIGH (ref 70–99)
Potassium: 4 mmol/L (ref 3.5–5.1)
Sodium: 138 mmol/L (ref 135–145)

## 2021-08-05 MED ORDER — METHYLPREDNISOLONE SODIUM SUCC 125 MG IJ SOLR
125.0000 mg | Freq: Once | INTRAMUSCULAR | Status: AC
  Administered 2021-08-05: 125 mg via INTRAVENOUS
  Filled 2021-08-05: qty 2

## 2021-08-05 MED ORDER — ENOXAPARIN SODIUM 40 MG/0.4ML IJ SOSY
40.0000 mg | PREFILLED_SYRINGE | INTRAMUSCULAR | Status: DC
  Administered 2021-08-06 – 2021-08-08 (×3): 40 mg via SUBCUTANEOUS
  Filled 2021-08-05 (×3): qty 0.4

## 2021-08-05 MED ORDER — IPRATROPIUM-ALBUTEROL 0.5-2.5 (3) MG/3ML IN SOLN
3.0000 mL | Freq: Once | RESPIRATORY_TRACT | Status: AC
  Administered 2021-08-05: 3 mL via RESPIRATORY_TRACT
  Filled 2021-08-05: qty 3

## 2021-08-05 NOTE — Progress Notes (Signed)
PROGRESS NOTE  [name] KDX:[id number] DOB: July 26, 1936 DOA: 07/27/2021 PCP: Biagio Borg, MD  HPI/Recap of past 60 hours: 85 year old female with HFrEF, hypertension, chronic kidney disease stage III, who was admitted with metabolic encephalopathy after being found down.  Head CT was negative there was no evidence of UTI.  Mental status has improved somewhat but still confused.  Her Neurontin and Ultram have been held with improvement  Subjective: August 01, 2021, Patient seen and examined at bedside her friend [name] she says she is a good friend is at bedside.  August 03, 2019 General: Patient seen and examined at bedside.  She was on the phone with her daughter She denies any complaint and there was no new event overnight nurse noted that patient is some not able to ambulate and she is even having a lot of difficulty rolling over she is more weak on the right side.  Patient had physical therapy evaluation on July 31, 2021 had recommended for SNF or 24-hour supervision/assistance  August 03, 2021: Patient seen and examined at bedside, her daughter [name] is at bedside Patient is doing better.  She was able to sit on the recliner for couple of hours today nurse noted that she was able to stand with a walker but could not walk.  Also patient complaining of pain in both feet particularly the left this might be what is impairing her ambulation. [name] noted that she would like for her mother to go to the rehab and she is going to look out a number of places in [place] tomorrow which is Monday  August 04, 2021: Patient seen and examined at bedside.  She is having a little bit of wheezing and shortness of breath.  She stated she has asthma  August 05, 2021: Patient seen and examined at bedside.  She is sitting in the bed No overt respiratory distress  Assessment/Plan: Principal Problem:   Altered mental status Active Problems:   Essential hypertension   CKD (chronic  kidney disease) stage 4, GFR 15-29 ml/min (HCC)   Chronic pain   Long term (current) use of systemic steroids   Chronic diastolic CHF (congestive heart failure) (HCC)   AMS (altered mental status)   Metabolic encephalopathy   Sepsis with encephalopathy without septic shock (HCC) Metabolic encephalopathy. sepsis Neurology following LP done and CSF reveals only mild elevation of protein which is nonspecific but can be seen in viral infection Continue acyclovir until HSV from CSF results are negative Still holding Neurontin and Ultram Blood culture was negative x5 days CSF culture is also negative x3 days CSF HSV-1 DNA PCR is negative discontinue acyclovir CSF fungal culture was negative after 5 days Neurology has signed off for now ID has signed off Negative culture  2.  Hypokalemia we will replete  3.  Leukocytosis no clear infectious etiology leukocyte is improving it is 12.9 today  7.  Elevated CK was thought to be due to patient being down when she was found She was treated with IV fluid cautiously given her history of HFpEF We will continue to monitor particularly renal function  6.  HFpEF Patient appears euvolemic  7.  CKD4. Patient IV hydrated likely exacerbation from her elevated CK Creatinine is improved from 1.26 today Continue to encourage p.o. fluid intake  we will continue to monitor  8.  Foot pain.  This might be impairing her ability to ambulate I will get x-ray of the left foot patient encouraged to take her medication for  pain which she stated she was not in pain body hurts or sore X-ray of the foot reveals osteoarthritis I will get uric acid level   9.  Asthma with mild exacerbation today Well well give her her nebulizer treatment she is already on prednisone and montelukast and Dulera Obtain chest x-ray queen only atelectasis no acute disease Patient will be given a dose of Solu-Medrol today She is already chronically on prednisone  Code Status:  Full  Severity of Illness: The appropriate patient status for this patient is INPATIENT. Inpatient status is judged to be reasonable and necessary in order to provide the required intensity of service to ensure the patient's safety. The patient's presenting symptoms, physical exam findings, and initial radiographic and laboratory data in the context of their chronic comorbidities is felt to place them at high risk for further clinical deterioration. Furthermore, it is not anticipated that the patient will be medically stable for discharge from the hospital within 2 midnights of admission. The following factors support the patient status of inpatient.  Patient still confused with encephalopathy   * I certify that at the point of admission it is my clinical judgment that the patient will require inpatient hospital care spanning beyond 2 midnights from the point of admission due to high intensity of service, high risk for further deterioration and high frequency of surveillance required.*   Family Communication: October 7: Good friend and at bedside At bedside October 8: I had a lengthy discussion with her daughter [name] over the phone who was on her way to the room she said she was stuck in traffic downstairs did not have any way to park.  I updated her on patient's progress she has specific questions about certain lab values which I discussed with her. October 9: [name] her daughter at bedside October 10: Updated her daughter [name] on over the phone October 11 daughter at the side of bed  Disposition Plan: To SNF Status is: Inpatient   Dispo: The patient is from: Home              Anticipated d/c is to: Patient likely going to need SNF/rehab To [facility] when they have received authorization              Anticipated d/c date is: To be determined              Patient currently not medically stable for discharge  Consultants: Neurology Infectious  disease  Procedures: None  Antimicrobials: None Acyclovir  DVT prophylaxis: Lovenox   Objective: Vitals:   08/04/21 1120 08/04/21 2014 08/04/21 2018 08/05/21 0315  BP: (!) 141/74  (!) 149/85 (!) 176/82  Pulse: 95  87 78  Resp: (!) 22  (!) 22 (!) 22  Temp: 97.8 F (36.6 C)  98.3 F (36.8 C) 98 F (36.7 C)  TempSrc: Oral  Axillary Oral  SpO2: 96% 94% 100% 95%  Weight:      Height:        Intake/Output Summary (Last 24 hours) at 08/05/2021 1018 Last data filed at 08/05/2021 0600 Gross per 24 hour  Intake 100.73 ml  Output 400 ml  Net -299.27 ml    Filed Weights   07/27/21 1626  Weight: 70.3 kg   Body mass index is 30.27 kg/m.  Exam:  General: 85 y.o. year-old female well developed well nourished in no acute distress.  Alert and oriented x3.  Patient sitting in a recliner at the side of her bed Cardiovascular: Regular  rate and rhythm with no rubs or gallops.  No thyromegaly or JVD noted.   Respiratory: Few expiratory wheezes ,no rales. Good inspiratory effort. Abdomen: Soft nontender nondistended with normal bowel sounds x4 quadrants. Musculoskeletal: +lower extremity edema. 2/4 pulses in all 4 extremities. Skin: No ulcerative lesions noted or rashes, Psychiatry: Mood is appropriate for condition and setting Neurology: Patient still a little confused   Data Reviewed: CBC: Recent Labs  Lab 07/30/21 0515 07/31/21 0527 08/01/21 0522 08/02/21 0556 08/04/21 0528  WBC 29.8* 21.0* 15.1* 12.9* 12.7*  NEUTROABS  --   --  11.7* 9.9*  --   HGB 9.2* 9.2* 9.2* 9.4* 8.4*  HCT 28.5* 29.1* 28.9* 29.9* 26.4*  MCV 101.1* 100.7* 100.7* 100.3* 101.1*  PLT 173 188 198 200 676    Basic Metabolic Panel: Recent Labs  Lab 07/30/21 0515 07/31/21 0527 08/01/21 0522 08/02/21 0556 08/04/21 0528  NA 141 138 140 146* 137  K 4.4 3.2* 3.8 3.8 3.9  CL 107 106 109 112* 108  CO2 24 22 22 24 22   GLUCOSE 163* 108* 98 113* 121*  BUN 34* 24* 18 18 22   CREATININE 1.62* 1.25*  1.20* 1.10* 1.26*  CALCIUM 8.8* 8.4* 9.2 9.0 8.5*  MG 1.7 1.6*  --   --   --     GFR: Estimated Creatinine Clearance: 29.6 mL/min (A) (by C-G formula based on SCr of 1.26 mg/dL (H)). Liver Function Tests: Recent Labs  Lab 07/31/21 0527  AST 35  ALT 34  ALKPHOS 67  BILITOT 0.5  PROT 5.8*  ALBUMIN 2.6*    No results for input(s): LIPASE, AMYLASE in the last 168 hours. Recent Labs  Lab 07/29/21 1028  AMMONIA 37*    Coagulation Profile: No results for input(s): INR, PROTIME in the last 168 hours.  Cardiac Enzymes: Recent Labs  Lab 07/30/21 0515 08/01/21 0522  CKTOTAL 523* 204    BNP (last 3 results) Recent Labs    06/18/21 1648  PROBNP 56.0    HbA1C: No results for input(s): HGBA1C in the last 72 hours. CBG: No results for input(s): GLUCAP in the last 168 hours.  Lipid Profile: No results for input(s): CHOL, HDL, LDLCALC, TRIG, CHOLHDL, LDLDIRECT in the last 72 hours. Thyroid Function Tests: No results for input(s): TSH, T4TOTAL, FREET4, T3FREE, THYROIDAB in the last 72 hours. Anemia Panel: No results for input(s): VITAMINB12, FOLATE, FERRITIN, TIBC, IRON, RETICCTPCT in the last 72 hours. Urine analysis:    Component Value Date/Time   COLORURINE YELLOW 07/27/2021 2053   APPEARANCEUR CLEAR 07/27/2021 2053   LABSPEC 1.009 07/27/2021 2053   PHURINE 6.0 07/27/2021 2053   GLUCOSEU NEGATIVE 07/27/2021 2053   GLUCOSEU NEGATIVE 08/01/2020 1620   HGBUR MODERATE (A) 07/27/2021 2053   BILIRUBINUR NEGATIVE 07/27/2021 2053   KETONESUR 5 (A) 07/27/2021 2053   PROTEINUR NEGATIVE 07/27/2021 2053   UROBILINOGEN 0.2 08/01/2020 1620   NITRITE NEGATIVE 07/27/2021 2053   LEUKOCYTESUR NEGATIVE 07/27/2021 2053   Sepsis Labs: @LABRCNTIP (procalcitonin:4,lacticidven:4)  ) Recent Results (from the past 240 hour(s))  Blood culture (routine x 2)     Status: None   Collection Time: 07/27/21  4:40 PM   Specimen: BLOOD  Result Value Ref Range Status   Specimen Description    Final    BLOOD SITE NOT SPECIFIED Performed at [facility], [place] [address], [place], [place] [address]    Special Requests   Final    BOTTLES DRAWN AEROBIC AND ANAEROBIC Blood Culture adequate volume Performed at  [facility], [place] [address], [place], [place] [address]    Culture   Final    NO GROWTH 5 DAYS Performed at [facility], [place] [address], [place], [place] [address]    Report Status 08/01/2021 FINAL  Final  Blood culture (routine x 2)     Status: None   Collection Time: 07/27/21  4:40 PM   Specimen: BLOOD RIGHT FOREARM  Result Value Ref Range Status   Specimen Description   Final    BLOOD RIGHT FOREARM Performed at [facility], [place] [address] [place], [place] [address]    Special Requests   Final    BOTTLES DRAWN AEROBIC AND ANAEROBIC Blood Culture results may not be optimal due to an excessive volume of blood received in culture bottles Performed at [place] [address] [place], [place] [address]    Culture   Final    NO GROWTH 5 DAYS Performed at [facility], [place] [address], [place], [place] [address]    Report Status 08/01/2021 FINAL  Final  Resp Panel by RT-PCR (Flu A&B, Covid) Nasopharyngeal Swab     Status: None   Collection Time: 07/27/21  4:54 PM   Specimen: Nasopharyngeal Swab; Nasopharyngeal(NP) swabs in vial transport medium  Result Value Ref Range Status   SARS Coronavirus 2 by RT PCR NEGATIVE NEGATIVE Final    Comment: (NOTE) SARS-CoV-2 target nucleic acids are NOT DETECTED.  The SARS-CoV-2 RNA is generally detectable in upper respiratory specimens during the acute phase of infection. The lowest concentration of SARS-CoV-2 viral copies this assay can detect is 138 copies/mL. A negative result does not preclude SARS-Cov-2 infection and should not be used as the sole basis for treatment or other patient management decisions. A negative result may occur with   improper specimen collection/handling, submission of specimen other than nasopharyngeal swab, presence of viral mutation(s) within the areas targeted by this assay, and inadequate number of viral copies(<138 copies/mL). A negative result must be combined with clinical observations, patient history, and epidemiological information. The expected result is Negative.  Fact Sheet for Patients:  EntrepreneurPulse.com.au  Fact Sheet for Healthcare Providers:  IncredibleEmployment.be  This test is no t yet approved or cleared by the [place] FDA and  has been authorized for detection and/or diagnosis of SARS-CoV-2 by FDA under an Emergency Use Authorization (EUA). This EUA will remain  in effect (meaning this test can be used) for the duration of the COVID-19 declaration under Section 564(b)(1) of the Act, 21 U.S.C.section 360bbb-3(b)(1), unless the authorization is terminated  or revoked sooner.       Influenza A by PCR NEGATIVE NEGATIVE Final   Influenza B by PCR NEGATIVE NEGATIVE Final    Comment: (NOTE) The Xpert Xpress SARS-CoV-2/FLU/RSV plus assay is intended as an aid in the diagnosis of influenza from Nasopharyngeal swab specimens and should not be used as a sole basis for treatment. Nasal washings and aspirates are unacceptable for Xpert Xpress SARS-CoV-2/FLU/RSV testing.  Fact Sheet for Patients: EntrepreneurPulse.com.au  Fact Sheet for Healthcare Providers: IncredibleEmployment.be  This test is not yet approved or cleared by the [place] FDA and has been authorized for detection and/or diagnosis of SARS-CoV-2 by FDA under an Emergency Use Authorization (EUA). This EUA will remain in effect (meaning this test can be used) for the duration of the COVID-19 declaration under Section 564(b)(1) of the Act, 21 U.S.C. section 360bbb-3(b)(1), unless the authorization is terminated  or revoked.  Performed at [facility], [place] [address], [place], [place] [address]   Urine Culture     Status: None   Collection Time: 07/27/21  8:54 PM   Specimen: In/Out Cath Urine  Result Value Ref Range Status   Specimen Description   Final    IN/OUT CATH URINE Performed at [place] [address], [place], [place] [address]    Special Requests   Final    NONE Performed at [facility], [place] [address], [place], [place] [address]    Culture   Final    NO GROWTH Performed at [facility], [place] [address], [place], [place] [address]    Report Status 07/28/2021 FINAL  Final  Culture, blood (routine x 2)     Status: None   Collection Time: 07/29/21  3:37 PM   Specimen: BLOOD LEFT WRIST  Result Value Ref Range Status   Specimen Description   Final    BLOOD LEFT WRIST Performed at [place] [address], [place], [place] [address]    Special Requests   Final    BOTTLES DRAWN AEROBIC ONLY Blood Culture results may not be optimal due to an inadequate volume of blood received in culture bottles Performed at [place] [address] [place], [place] [address]    Culture   Final    NO GROWTH 5 DAYS Performed at [facility], [place] [address], [place], [place] [address]    Report Status 08/03/2021 FINAL  Final  Culture, blood (routine x 2)     Status: None   Collection Time: 07/29/21  3:38 PM   Specimen: BLOOD  Result Value Ref Range Status   Specimen Description   Final    BLOOD LEFT ANTECUBITAL Performed at [place] [address] [place], [place] [address]    Special Requests   Final    BOTTLES DRAWN AEROBIC ONLY Blood Culture results may not be optimal due to an inadequate volume of blood received in culture bottles Performed at [place] [address] [place], [place] [address]    Culture   Final     NO GROWTH 5 DAYS Performed at [facility], [place] [address], [place], [place] [address]    Report Status 08/03/2021 FINAL  Final  Respiratory (~20 pathogens) panel by PCR     Status: None   Collection Time: 07/29/21  5:46 PM   Specimen: Nasopharyngeal Swab; Respiratory  Result Value Ref Range Status   Adenovirus NOT DETECTED NOT DETECTED Final   Coronavirus 229E NOT DETECTED NOT DETECTED Final    Comment: (NOTE) The Coronavirus on the Respiratory Panel, DOES NOT test for the novel  Coronavirus (2019 nCoV)    Coronavirus HKU1 NOT DETECTED NOT DETECTED Final   Coronavirus NL63 NOT DETECTED NOT DETECTED Final   Coronavirus OC43 NOT DETECTED NOT DETECTED Final   Metapneumovirus NOT DETECTED NOT DETECTED Final   Rhinovirus / Enterovirus NOT DETECTED NOT DETECTED Final   Influenza A NOT DETECTED NOT DETECTED Final   Influenza B NOT DETECTED NOT DETECTED Final   Parainfluenza Virus 1 NOT DETECTED NOT DETECTED Final   Parainfluenza Virus 2 NOT DETECTED NOT DETECTED Final   Parainfluenza Virus 3 NOT DETECTED NOT DETECTED Final   Parainfluenza Virus 4 NOT DETECTED NOT DETECTED Final   Respiratory Syncytial Virus NOT DETECTED NOT DETECTED Final   Bordetella pertussis NOT DETECTED NOT DETECTED  Final   Bordetella Parapertussis NOT DETECTED NOT DETECTED Final   Chlamydophila pneumoniae NOT DETECTED NOT DETECTED Final   Mycoplasma pneumoniae NOT DETECTED NOT DETECTED Final    Comment: Performed at [facility], [place] [address] [place], [place] [address]  CSF culture w Gram Stain     Status: None   Collection Time: 07/30/21 12:30 PM   Specimen: CSF; Cerebrospinal Fluid  Result Value Ref Range Status   Specimen Description   Final    CSF Performed at [place] [address], [place], [place] [address]    Special Requests   Final    NONE Performed at [facility], [place] [address], [place], [place] [address]    Gram Stain   Final    WBC  PRESENT, PREDOMINANTLY MONONUCLEAR NO ORGANISMS SEEN CYTOSPIN SMEAR Gram Stain Report Called to,Read Back By and Verified With: B.CATES LAND, RN AT 9024 ON 10.05.22 BY N.THOMPSON Performed at [place] [address], [place], [place] [address]    Culture   Final    NO GROWTH 3 DAYS Performed at [facility], [place] [address] [place], [place] [address]    Report Status 08/03/2021 FINAL  Final  Culture, fungus without smear     Status: None (Preliminary result)   Collection Time: 07/30/21 12:30 PM   Specimen: CSF; Cerebrospinal Fluid  Result Value Ref Range Status   Specimen Description   Final    CSF Performed at [place] [address] [place], [place] [address]    Special Requests   Final    NONE Performed at [facility], [place] [address], [place], [place] [address]    Culture   Final    NO FUNGUS ISOLATED AFTER 5 DAYS Performed at [facility], [place] [address] [place], [place] [address]    Report Status PENDING  Incomplete      Studies: DG CHEST PORT 1 VIEW  Result Date: 08/04/2021 CLINICAL DATA:  Shortness of breath. EXAM: PORTABLE CHEST 1 VIEW COMPARISON:  July 27, 2021. FINDINGS: The heart size and mediastinal contours are within normal limits. Hypoinflation of the lungs is noted with mild bibasilar subsegmental atelectasis. The visualized skeletal structures are unremarkable. IMPRESSION: Hypoinflation of the lungs with mild bibasilar subsegmental atelectasis. Electronically Signed   By: Marijo Conception M.D.   On: 08/04/2021 16:40    Scheduled Meds:  aspirin EC  81 mg Oral q morning   enoxaparin (LOVENOX) injection  30 mg Subcutaneous Q24H   feeding supplement  237 mL Oral BID BM   ipratropium-albuterol  3 mL Nebulization Once   methylPREDNISolone (SOLU-MEDROL) injection  125 mg Intravenous Once   mometasone-formoterol  2 puff Inhalation BID   montelukast  10 mg Oral QHS   multivitamin  with minerals  1 tablet Oral Daily   pantoprazole  40 mg Oral Daily   predniSONE  6 mg Oral Q breakfast    Continuous Infusions:  sodium chloride 10 mL/hr at 08/03/21 1716     LOS: 7 days     Cristal Deer, MD [facility]  To reach me or the doctor on call, go to: [email or web address] Password [email or web address]  08/05/2021, 10:18 AM

## 2021-08-06 DIAGNOSIS — N184 Chronic kidney disease, stage 4 (severe): Secondary | ICD-10-CM | POA: Diagnosis not present

## 2021-08-06 DIAGNOSIS — I10 Essential (primary) hypertension: Secondary | ICD-10-CM | POA: Diagnosis not present

## 2021-08-06 DIAGNOSIS — R4182 Altered mental status, unspecified: Secondary | ICD-10-CM | POA: Diagnosis not present

## 2021-08-06 DIAGNOSIS — I5032 Chronic diastolic (congestive) heart failure: Secondary | ICD-10-CM | POA: Diagnosis not present

## 2021-08-06 LAB — CBC WITH DIFFERENTIAL/PLATELET
Abs Immature Granulocytes: 0.49 10*3/uL — ABNORMAL HIGH (ref 0.00–0.07)
Basophils Absolute: 0 10*3/uL (ref 0.0–0.1)
Basophils Relative: 0 %
Eosinophils Absolute: 0 10*3/uL (ref 0.0–0.5)
Eosinophils Relative: 0 %
HCT: 28.3 % — ABNORMAL LOW (ref 36.0–46.0)
Hemoglobin: 9 g/dL — ABNORMAL LOW (ref 12.0–15.0)
Immature Granulocytes: 3 %
Lymphocytes Relative: 8 %
Lymphs Abs: 1.3 10*3/uL (ref 0.7–4.0)
MCH: 31.6 pg (ref 26.0–34.0)
MCHC: 31.8 g/dL (ref 30.0–36.0)
MCV: 99.3 fL (ref 80.0–100.0)
Monocytes Absolute: 1.1 10*3/uL — ABNORMAL HIGH (ref 0.1–1.0)
Monocytes Relative: 7 %
Neutro Abs: 12.8 10*3/uL — ABNORMAL HIGH (ref 1.7–7.7)
Neutrophils Relative %: 82 %
Platelets: 235 10*3/uL (ref 150–400)
RBC: 2.85 MIL/uL — ABNORMAL LOW (ref 3.87–5.11)
RDW: 14.2 % (ref 11.5–15.5)
WBC: 15.7 10*3/uL — ABNORMAL HIGH (ref 4.0–10.5)
nRBC: 0 % (ref 0.0–0.2)

## 2021-08-06 LAB — SARS CORONAVIRUS 2 (TAT 6-24 HRS): SARS Coronavirus 2: NEGATIVE

## 2021-08-06 NOTE — Progress Notes (Signed)
PROGRESS NOTE    [name]  IFO:[id number] DOB: 07-02-1936 DOA: 07/27/2021 PCP: Biagio Borg, MD   Brief Narrative: [name] is a 85 y.o. female with HFrEF, HTN, CKD 3. Patient presented with confusion and was found down. Concern for possible viral encephalopathy. Meningitis workup was negative. Antiinfectives discontinued. Currently awaiting SNF placement.   Assessment & Plan:   Principal Problem:   Altered mental status Active Problems:   Essential hypertension   CKD (chronic kidney disease) stage 4, GFR 15-29 ml/min (HCC)   Chronic pain   Long term (current) use of systemic steroids   Chronic diastolic CHF (congestive heart failure) (HCC)   AMS (altered mental status)   Metabolic encephalopathy   Sepsis with encephalopathy without septic shock (HCC)   Metabolic encephalopathy Initial concern for meningitis. Infectious disease and neurology consulted. Patient was empirically treated with Vancomycin, Ceftriaxone, ampicillin and Acyclovir. LP obtained on 10/5 significant for elevated protein. CSF HSV pcr was negative. CSF meningoencephalitis pcr negative. With improvement of mental status and negative workup, antiinfectives were discontinued on 10/6. Blood and urine cultures negative. MRI without classic HSV changes or other acute process to explain encephalopathy.  Hypokalemia Repleted. Resolved.  Leukocytosis Max of 29,800. Likely related to underlying unspecified infection. Improved during treatment for above. Still with mildly elevated WBC but no evidence/symptoms of infection. Possibly related to prednisone use.  Possible sepsis Unsure if this was present or not. Workup was negative for identification of an infectious pathogen however clinical course is suggestive of infectious process. Cannot rule out sepsis. Currently resolved.  Rhabdomyolysis Secondary to fall and being found down. CK of 1200 on admission with resolution. Treated with IV fluids.  No associated AKI.  Chronic diastolic heart failure Stable. Euvolemic.  CKD stage IV Baseline creatinine of about 1.6. Currently below baseline.  Primary hypertension Patient is on amlodipine as an outpatient.  Left foot pain Arthritis noted on x-ray.  Inflammatory arthritis -Continue prednisone 6 mg daily  Asthma exacerbation Mild during admission. Treated with Albuterol prn. -Continue Dulera 2 puffs BID, Singulair 10 mg qhs, albuterol prn   DVT prophylaxis: Lovenox Code Status:   Code Status: Full Code Family Communication: None at bedside Disposition Plan: Discharge to SNF when bed is available   Consultants:  Neurology Infectious disease  Procedures:  LUMBAR PUNCTURE (10/5)  Antimicrobials: Vancomycin Ceftriaxone Ampicillin Acyclovir    Subjective: No concerns today. Feels well. Eager to be out of bed and ambulating.  Objective: Vitals:   08/05/21 2043 08/06/21 0349 08/06/21 0854 08/06/21 1457  BP: (!) 152/95 (!) 158/94  (!) 143/80  Pulse: 98 74  81  Resp: 20 18    Temp: 98.7 F (37.1 C) 98.8 F (37.1 C)  98.5 F (36.9 C)  TempSrc: Oral Oral  Oral  SpO2: 99% 100% 97% 100%  Weight:      Height:        Intake/Output Summary (Last 24 hours) at 08/06/2021 1727 Last data filed at 08/06/2021 0417 Gross per 24 hour  Intake --  Output 1150 ml  Net -1150 ml   Filed Weights   07/27/21 1626  Weight: 70.3 kg    Examination:  General exam: Appears calm and comfortable Respiratory system: Clear to auscultation. Respiratory effort normal. Cardiovascular system: S1 & S2 heard, RRR. No murmurs, rubs, gallops or clicks. Gastrointestinal system: Abdomen is nondistended, soft and nontender. No organomegaly or masses felt. Normal bowel sounds heard. Central nervous system: Alert and oriented. No focal neurological deficits.  Musculoskeletal: No calf tenderness Skin: No cyanosis. No rashes Psychiatry: Judgement and insight appear normal. Mood & affect  appropriate.     Data Reviewed: I have personally reviewed following labs and imaging studies  CBC Lab Results  Component Value Date   WBC 15.7 (H) 08/06/2021   RBC 2.85 (L) 08/06/2021   HGB 9.0 (L) 08/06/2021   HCT 28.3 (L) 08/06/2021   MCV 99.3 08/06/2021   MCH 31.6 08/06/2021   PLT 235 08/06/2021   MCHC 31.8 08/06/2021   RDW 14.2 08/06/2021   LYMPHSABS 1.3 08/06/2021   MONOABS 1.1 (H) 08/06/2021   EOSABS 0.0 08/06/2021   BASOSABS 0.0 75/64/3329     Last metabolic panel Lab Results  Component Value Date   NA 138 08/05/2021   K 4.0 08/05/2021   CL 107 08/05/2021   CO2 25 08/05/2021   BUN 22 08/05/2021   CREATININE 1.03 (H) 08/05/2021   GLUCOSE 132 (H) 08/05/2021   GFRNONAA 54 (L) 08/05/2021   GFRAA 25 (L) 12/25/2020   CALCIUM 8.9 08/05/2021   PROT 5.8 (L) 07/31/2021   ALBUMIN 2.6 (L) 07/31/2021   BILITOT 0.5 07/31/2021   ALKPHOS 67 07/31/2021   AST 35 07/31/2021   ALT 34 07/31/2021   ANIONGAP 6 08/05/2021    CBG (last 3)  No results for input(s): GLUCAP in the last 72 hours.   GFR: Estimated Creatinine Clearance: 36.2 mL/min (A) (by C-G formula based on SCr of 1.03 mg/dL (H)).  Coagulation Profile: No results for input(s): INR, PROTIME in the last 168 hours.  Recent Results (from the past 240 hour(s))  Urine Culture     Status: None   Collection Time: 07/27/21  8:54 PM   Specimen: In/Out Cath Urine  Result Value Ref Range Status   Specimen Description   Final    IN/OUT CATH URINE Performed at [facility] [address], [place], [place] [address]    Special Requests   Final    NONE Performed at [facility], [place] [address], [place], [place] [address]    Culture   Final    NO GROWTH Performed at [facility], [place] [address], [place], [place] [address]    Report Status 07/28/2021 FINAL  Final  Culture, blood (routine x 2)     Status: None   Collection Time: 07/29/21  3:37 PM   Specimen: BLOOD LEFT  WRIST  Result Value Ref Range Status   Specimen Description   Final    BLOOD LEFT WRIST Performed at [facility] [address], [place], [place] [address]    Special Requests   Final    BOTTLES DRAWN AEROBIC ONLY Blood Culture results may not be optimal due to an inadequate volume of blood received in culture bottles Performed at [facility] [address] [place], [place] [address]    Culture   Final    NO GROWTH 5 DAYS Performed at [facility], [place] [address] [place], [place] [address]    Report Status 08/03/2021 FINAL  Final  Culture, blood (routine x 2)     Status: None   Collection Time: 07/29/21  3:38 PM   Specimen: BLOOD  Result Value Ref Range Status   Specimen Description   Final    BLOOD LEFT ANTECUBITAL Performed at [facility] [address] [place], [place] [address]    Special Requests   Final    BOTTLES DRAWN AEROBIC  ONLY Blood Culture results may not be optimal due to an inadequate volume of blood received in culture bottles Performed at [facility], [place] [address] [place], [place] [address]    Culture   Final    NO GROWTH 5 DAYS Performed at [facility], [place] [address] [place], [place] [address]    Report Status 08/03/2021 FINAL  Final  Respiratory (~20 pathogens) panel by PCR     Status: None   Collection Time: 07/29/21  5:46 PM   Specimen: Nasopharyngeal Swab; Respiratory  Result Value Ref Range Status   Adenovirus NOT DETECTED NOT DETECTED Final   Coronavirus 229E NOT DETECTED NOT DETECTED Final    Comment: (NOTE) The Coronavirus on the Respiratory Panel, DOES NOT test for the novel  Coronavirus (2019 nCoV)    Coronavirus HKU1 NOT DETECTED NOT DETECTED Final   Coronavirus NL63 NOT DETECTED NOT DETECTED Final   Coronavirus OC43 NOT DETECTED NOT DETECTED Final   Metapneumovirus NOT DETECTED NOT DETECTED Final   Rhinovirus / Enterovirus NOT DETECTED NOT  DETECTED Final   Influenza A NOT DETECTED NOT DETECTED Final   Influenza B NOT DETECTED NOT DETECTED Final   Parainfluenza Virus 1 NOT DETECTED NOT DETECTED Final   Parainfluenza Virus 2 NOT DETECTED NOT DETECTED Final   Parainfluenza Virus 3 NOT DETECTED NOT DETECTED Final   Parainfluenza Virus 4 NOT DETECTED NOT DETECTED Final   Respiratory Syncytial Virus NOT DETECTED NOT DETECTED Final   Bordetella pertussis NOT DETECTED NOT DETECTED Final   Bordetella Parapertussis NOT DETECTED NOT DETECTED Final   Chlamydophila pneumoniae NOT DETECTED NOT DETECTED Final   Mycoplasma pneumoniae NOT DETECTED NOT DETECTED Final    Comment: Performed at [facility], [place] [address] [place], [place] [address]  CSF culture w Gram Stain     Status: None   Collection Time: 07/30/21 12:30 PM   Specimen: CSF; Cerebrospinal Fluid  Result Value Ref Range Status   Specimen Description   Final    CSF Performed at [facility] [address], [place], [place] [address]    Special Requests   Final    NONE Performed at [facility], [place] [address], [place], [place] [address]    Gram Stain   Final    WBC PRESENT, PREDOMINANTLY MONONUCLEAR NO ORGANISMS SEEN CYTOSPIN SMEAR Gram Stain Report Called to,Read Back By and Verified With: B.CATES LAND, RN AT 7253 ON 10.05.22 BY N.THOMPSON Performed at [facility] [address] [place], [place] [address]    Culture   Final    NO GROWTH 3 DAYS Performed at [facility], [place] [address] [place], [place] [address]    Report Status 08/03/2021 FINAL  Final  Culture, fungus without smear     Status: None (Preliminary result)   Collection Time: 07/30/21 12:30 PM   Specimen: CSF; Cerebrospinal Fluid  Result Value Ref Range Status   Specimen Description   Final    CSF Performed at [facility] [address] [place], [place] [address]    Special Requests   Final     NONE Performed at [facility], [place] [address] [place],  [address]    Culture   Final    NO FUNGUS ISOLATED AFTER 6 DAYS Performed at [facility], [place] [address] [place],  [address]    Report Status PENDING  Incomplete  SARS CORONAVIRUS 2 (TAT 6-24  HRS) Nasopharyngeal Nasopharyngeal Swab     Status: None   Collection Time: 08/05/21 12:13 PM   Specimen: Nasopharyngeal Swab  Result Value Ref Range Status   SARS Coronavirus 2 NEGATIVE NEGATIVE Final    Comment: (NOTE) SARS-CoV-2 target nucleic acids are NOT DETECTED.  The SARS-CoV-2 RNA is generally detectable in upper and lower respiratory specimens during the acute phase of infection. Negative results do not preclude SARS-CoV-2 infection, do not rule out co-infections with other pathogens, and should not be used as the sole basis for treatment or other patient management decisions. Negative results must be combined with clinical observations, patient history, and epidemiological information. The expected result is Negative.  Fact Sheet for Patients: SugarRoll.be  Fact Sheet for Healthcare Providers: https://www.woods-mathews.com/  This test is not yet approved or cleared by the [place] FDA and  has been authorized for detection and/or diagnosis of SARS-CoV-2 by FDA under an Emergency Use Authorization (EUA). This EUA will remain  in effect (meaning this test can be used) for the duration of the COVID-19 declaration under Se ction 564(b)(1) of the Act, 21 U.S.C. section 360bbb-3(b)(1), unless the authorization is terminated or revoked sooner.  Performed at [facility], [place] [address] [place], [place] [address]         Radiology Studies: DG CHEST PORT 1 VIEW  Result Date: 08/05/2021 CLINICAL DATA:  Shortness of breath EXAM: PORTABLE CHEST 1 VIEW COMPARISON:  Radiograph 08/04/2021 FINDINGS: Unchanged cardiomediastinal  silhouette. Low lung volumes with bibasilar opacities. No large pleural effusion or visible pneumothorax. Bilateral glenohumeral osteoarthritis. No acute osseous abnormality. IMPRESSION: Low lung volumes with bibasilar opacities, likely atelectasis. No new airspace disease. Electronically Signed   By: Maurine Simmering M.D.   On: 08/05/2021 15:06        Scheduled Meds:  aspirin EC  81 mg Oral q morning   enoxaparin (LOVENOX) injection  40 mg Subcutaneous Q24H   feeding supplement  237 mL Oral BID BM   mometasone-formoterol  2 puff Inhalation BID   montelukast  10 mg Oral QHS   multivitamin with minerals  1 tablet Oral Daily   pantoprazole  40 mg Oral Daily   predniSONE  6 mg Oral Q breakfast   Continuous Infusions:  sodium chloride 10 mL/hr at 08/03/21 1716     LOS: 8 days     Cordelia Poche, MD [facility] 08/06/2021, 5:27 PM  If 7PM-7AM, please contact night-coverage [email or web address]

## 2021-08-07 DIAGNOSIS — R4182 Altered mental status, unspecified: Secondary | ICD-10-CM | POA: Diagnosis not present

## 2021-08-07 DIAGNOSIS — I5032 Chronic diastolic (congestive) heart failure: Secondary | ICD-10-CM | POA: Diagnosis not present

## 2021-08-07 DIAGNOSIS — I10 Essential (primary) hypertension: Secondary | ICD-10-CM | POA: Diagnosis not present

## 2021-08-07 DIAGNOSIS — N184 Chronic kidney disease, stage 4 (severe): Secondary | ICD-10-CM | POA: Diagnosis not present

## 2021-08-07 NOTE — Plan of Care (Signed)

## 2021-08-07 NOTE — Progress Notes (Signed)
Occupational Therapy Treatment Patient Details Name: [name] MRN: [id number] DOB: 10-02-1936 Today's Date: 08/07/2021   History of present illness Admitted 10/2 after syncopal episode and fall. CT clear for fracture or focal lesions, acute infarctions, hemmorrahge, hydrocephalus. PMH significant for CHF, HTN, and CKD stage 3-4.   OT comments  Pt with progress towards goals this session. Pt with sit to stand with min A +2  for safety and 5 steps to sit in recliner with RW with min A. Pt also performed toileting hygiene with Max A +2 with assistance to manage brief. Pt with moderate SOB with minimal activity, although SpO2 maintained above 95% throughout session on 1 L of O2. Despite continued decreased activity tolerance, pt motivated to participate in therapy. Continuing to recommend SNF rehab post d/c, will continue to follow in the acute setting.   Recommendations for follow up therapy are one component of a multi-disciplinary discharge planning process, led by the attending physician.  Recommendations may be updated based on patient status, additional functional criteria and insurance authorization.    Follow Up Recommendations  SNF    Equipment Recommendations  Other (comment) (defer to next venue)    Recommendations for Other Services      Precautions / Restrictions Precautions Precautions: Fall Precaution Comments: quickly incontinent, take a brief. (put it on before standing) states she gets vertigo easily Restrictions Weight Bearing Restrictions: No       Mobility Bed Mobility Overal bed mobility: Needs Assistance Bed Mobility: Supine to Sit     Supine to sit: Mod assist;HOB elevated     General bed mobility comments: requires assist to push up into sitting and scoot to EOB.    Transfers Overall transfer level: Needs assistance Equipment used: Rolling walker (2 wheeled) Transfers: Sit to/from Stand Sit to Stand: Min assist Stand pivot transfers: +2  safety/equipment       General transfer comment: Assist to power up    Balance Overall balance assessment: Needs assistance Sitting-balance support: Feet supported;Single extremity supported Sitting balance-Leahy Scale: Fair Sitting balance - Comments: Poor progressing to fair sitting EOB   Standing balance support: During functional activity;Bilateral upper extremity supported Standing balance-Leahy Scale: Poor Standing balance comment: reliant on support                           ADL either performed or assessed with clinical judgement   ADL Overall ADL's : Needs assistance/impaired                             Toileting- Clothing Manipulation and Hygiene: Maximal assistance;Sit to/from stand Toileting - Clothing Manipulation Details (indicate cue type and reason): Pt able to assist to stabilize self with RW while therapist performed perianal care and donned breif.     Functional mobility during ADLs: Rolling walker;Minimal assistance;+2 for safety/equipment       Vision       Perception     Praxis      Cognition Arousal/Alertness: Awake/alert Behavior During Therapy: WFL for tasks assessed/performed Overall Cognitive Status: Within Functional Limits for tasks assessed                                 General Comments: Requires increased time        Exercises  BLE ankle pumps; 5x each   Shoulder Instructions  General Comments      Pertinent Vitals/ Pain       Pain Assessment: Faces Faces Pain Scale: Hurts a little bit Pain Location: back Pain Descriptors / Indicators: Discomfort;Sore;Grimacing Pain Intervention(s): Limited activity within patient's tolerance;Repositioned  Home Living Family/patient expects to be discharged to:: Private residence Living Arrangements: Alone                                      Prior Functioning/Environment              Frequency  Min 2X/week         Progress Toward Goals  OT Goals(current goals can now be found in the care plan section)  Progress towards OT goals: Progressing toward goals  Acute Rehab OT Goals Patient Stated Goal: To improve strength and be able to go home OT Goal Formulation: With patient Time For Goal Achievement: 08/14/21 Potential to Achieve Goals: Good ADL Goals Pt Will Perform Grooming: sitting;with set-up Pt Will Perform Upper Body Dressing: sitting;with set-up Pt Will Perform Lower Body Dressing: sit to/from stand;with mod assist Pt Will Transfer to Toilet: with min assist;bedside commode Pt Will Perform Toileting - Clothing Manipulation and hygiene: with mod assist;sit to/from stand  Plan Discharge plan remains appropriate    Co-evaluation                 AM-PAC OT "6 Clicks" Daily Activity     Outcome Measure   Help from another person eating meals?: A Little Help from another person taking care of personal grooming?: A Lot Help from another person toileting, which includes using toliet, bedpan, or urinal?: A Lot Help from another person bathing (including washing, rinsing, drying)?: A Lot Help from another person to put on and taking off regular upper body clothing?: A Little Help from another person to put on and taking off regular lower body clothing?: Total 6 Click Score: 13    End of Session Equipment Utilized During Treatment: Gait belt;Rolling walker;Oxygen  OT Visit Diagnosis: Unsteadiness on feet (R26.81);Other abnormalities of gait and mobility (R26.89);Muscle weakness (generalized) (M62.81);History of falling (Z91.81);Pain   Activity Tolerance Patient tolerated treatment well   Patient Left in chair;with call bell/phone within reach;with chair alarm set;with family/visitor present   Nurse Communication Mobility status;Other (comment) (SOB)        Time: 9233-0076 OT Time Calculation (min): 27 min  Charges: OT General Charges $OT Visit: 1 Visit OT Treatments $Self  Care/Home Management : 8-22 mins $Therapeutic Activity: 8-22 mins  [name], OTS Acute Rehab Office: [phone]   Yoshio Seliga 08/07/2021, 1:36 PM

## 2021-08-07 NOTE — Progress Notes (Signed)
Physical Therapy Treatment Patient Details Name: [name] MRN: [id number] DOB: [birth year] Today's Date: 08/07/2021   History of Present Illness Admitted 10/2 after syncopal episode and fall. CT clear for fracture or focal lesions, acute infarctions, hemmorrahge, hydrocephalus. PMH significant for CHF, HTN, and CKD stage 3-4.    PT Comments    Pt eager to mobilize to progress mobility. Pt tolerated 2x20 ft ambulation with RW, overall requiring min-mod physical assist to mobilize at this time. Pt requires extended rest breaks and continues to demonstrate deficits in strength, activity tolerance, and balance, although pt is slowly improving. SNF remains appropriate disposition.   SpO2 98% on RA immediately post-gait, O2 reapplied at end of session. HR 94-118 bpm     Recommendations for follow up therapy are one component of a multi-disciplinary discharge planning process, led by the attending physician.  Recommendations may be updated based on patient status, additional functional criteria and insurance authorization.  Follow Up Recommendations  SNF     Equipment Recommendations  None recommended by PT    Recommendations for Other Services       Precautions / Restrictions Precautions Precautions: Fall Precaution Comments: incontinent Restrictions Weight Bearing Restrictions: No     Mobility  Bed Mobility Overal bed mobility: Needs Assistance Bed Mobility: Supine to Sit;Sit to Supine     Supine to sit: Mod assist Sit to supine: Mod assist   General bed mobility comments: mod assist for trunk and LE management, scooting to/from EOB, and boost up in bed.    Transfers Overall transfer level: Needs assistance Equipment used: Rolling walker (2 wheeled) Transfers: Sit to/from Stand Sit to Stand: Min assist Stand pivot transfers: +2 safety/equipment       General transfer comment: min assist for power up, rise, steady. STS x2, from EOB and chair in  hallway.  Ambulation/Gait Ambulation/Gait assistance: Min assist Gait Distance (Feet): 20 Feet (x2 - 3 minute seated rest break) Assistive device: Rolling walker (2 wheeled) Gait Pattern/deviations: Step-through pattern;Decreased stride length;Trunk flexed Gait velocity: decr   General Gait Details: assist to steady and navigate RW especially during directional changes. HRmax 118 bpm during gait, recovered to 94 bpm with 3 minute seated rest.   Stairs             Wheelchair Mobility    Modified Rankin (Stroke Patients Only)       Balance Overall balance assessment: Needs assistance Sitting-balance support: Feet supported;Single extremity supported Sitting balance-Leahy Scale: Fair Sitting balance - Comments: Poor progressing to fair sitting EOB   Standing balance support: During functional activity;Bilateral upper extremity supported Standing balance-Leahy Scale: Poor Standing balance comment: reliant on support                            Cognition Arousal/Alertness: Awake/alert Behavior During Therapy: WFL for tasks assessed/performed Overall Cognitive Status: Within Functional Limits for tasks assessed                                 General Comments: motivated      Exercises General Exercises - Lower Extremity Long Arc Quad: AROM;Both;15 reps;Seated Hip Flexion/Marching: AROM;Both;15 reps;Seated    General Comments General comments (skin integrity, edema, etc.): pt's daughter present      Pertinent Vitals/Pain Pain Assessment: Faces Faces Pain Scale: No hurt Pain Location: back Pain Descriptors / Indicators: Discomfort;Sore;Grimacing Pain Intervention(s): Monitored during session  Home Living Family/patient expects to be discharged to:: Private residence Living Arrangements: Alone                  Prior Function            PT Goals (current goals can now be found in the care plan section) Acute Rehab PT  Goals Patient Stated Goal: To improve strength and be able to go home PT Goal Formulation: With patient Time For Goal Achievement: 08/05/21 Potential to Achieve Goals: Good Progress towards PT goals: Progressing toward goals    Frequency           PT Plan Current plan remains appropriate    Co-evaluation              AM-PAC PT "6 Clicks" Mobility   Outcome Measure  Help needed turning from your back to your side while in a flat bed without using bedrails?: A Lot Help needed moving from lying on your back to sitting on the side of a flat bed without using bedrails?: A Lot Help needed moving to and from a bed to a chair (including a wheelchair)?: A Lot Help needed standing up from a chair using your arms (e.g., wheelchair or bedside chair)?: A Little Help needed to walk in hospital room?: A Little Help needed climbing 3-5 steps with a railing? : Total 6 Click Score: 13    End of Session   Activity Tolerance: Patient tolerated treatment well Patient left: in bed;with call bell/phone within reach;with bed alarm set;with family/visitor present Nurse Communication: Mobility status PT Visit Diagnosis: Other abnormalities of gait and mobility (R26.89);Muscle weakness (generalized) (M62.81);Difficulty in walking, not elsewhere classified (R26.2)     Time: 1610-9604 PT Time Calculation (min) (ACUTE ONLY): 29 min  Charges:  $Gait Training: 8-22 mins $Therapeutic Activity: 8-22 mins                     Stacie Glaze, PT DPT Acute Rehabilitation Services Pager [phone]  Office [phone]    [place] 08/07/2021, 4:01 PM

## 2021-08-07 NOTE — Progress Notes (Signed)
PROGRESS NOTE    ALEYNA CUEVA  EXH:[id number] DOB: [birth year] DOA: 07/27/2021 PCP: Biagio Borg, MD   Brief Narrative: [name] is a 85 y.o. female with HFrEF, HTN, CKD 3. Patient presented with confusion and was found down. Concern for possible viral encephalopathy. Meningitis workup was negative. Antiinfectives discontinued. Currently awaiting SNF placement.   Assessment & Plan:   Principal Problem:   Altered mental status Active Problems:   Essential hypertension   CKD (chronic kidney disease) stage 4, GFR 15-29 ml/min (HCC)   Chronic pain   Long term (current) use of systemic steroids   Chronic diastolic CHF (congestive heart failure) (HCC)   AMS (altered mental status)   Metabolic encephalopathy   Sepsis with encephalopathy without septic shock (HCC)   Metabolic encephalopathy Initial concern for meningitis. Infectious disease and neurology consulted. Patient was empirically treated with Vancomycin, Ceftriaxone, ampicillin and Acyclovir. LP obtained on 10/5 significant for elevated protein. CSF HSV pcr was negative. CSF meningoencephalitis pcr negative. With improvement of mental status and negative workup, antiinfectives were discontinued on 10/6. Blood and urine cultures negative. MRI without classic HSV changes or other acute process to explain encephalopathy.  Hypokalemia Repleted. Resolved.  Leukocytosis Max of 29,800. Likely related to underlying unspecified infection. Improved during treatment for above. Still with mildly elevated WBC but no evidence/symptoms of infection. Possibly related to prednisone use.  Possible sepsis Unsure if this was present or not. Workup was negative for identification of an infectious pathogen however clinical course is suggestive of infectious process. Cannot rule out sepsis. Currently resolved.  Rhabdomyolysis Secondary to fall and being found down. CK of 1200 on admission with resolution. Treated with IV fluids.  No associated AKI.  Chronic diastolic heart failure Stable. Euvolemic.  CKD stage IV Baseline creatinine of about 1.6. Currently below baseline.  Primary hypertension Patient is on amlodipine as an outpatient.  Left foot pain Arthritis noted on x-ray.  Inflammatory arthritis -Continue prednisone 6 mg daily  Asthma exacerbation Mild during admission. Treated with Albuterol prn. -Continue Dulera 2 puffs BID, Singulair 10 mg qhs, albuterol prn -Wean to room air  Atelectasis -Incentive spirometer   DVT prophylaxis: Lovenox Code Status:   Code Status: Full Code Family Communication: None at bedside. Daughter via telephone (11 minutes) Disposition Plan: Discharge to SNF when bed is available.    Consultants:  Neurology Infectious disease  Procedures:  LUMBAR PUNCTURE (10/5)  Antimicrobials: Vancomycin Ceftriaxone Ampicillin Acyclovir    Subjective: No concerns today. Feels well. Eager to be out of bed and ambulating.  Objective: Vitals:   08/06/21 2042 08/07/21 0426 08/07/21 0843 08/07/21 1247  BP: 135/72 (!) 175/83  125/81  Pulse: 82 73  (!) 101  Resp: 19 (!) 21    Temp: 98 F (36.7 C) 98.6 F (37 C)  98.4 F (36.9 C)  TempSrc:    Oral  SpO2: 99% 100% 95% 98%  Weight:      Height:        Intake/Output Summary (Last 24 hours) at 08/07/2021 1500 Last data filed at 08/07/2021 1100 Gross per 24 hour  Intake 240 ml  Output 1650 ml  Net -1410 ml    Filed Weights   07/27/21 1626  Weight: 70.3 kg    Examination:  General exam: Appears calm and comfortable Respiratory system: lower lobe faint rales. Respiratory effort normal. Cardiovascular system: S1 & S2 heard, RRR. 2/6 systolic murmur Gastrointestinal system: Abdomen is nondistended, soft and nontender. No organomegaly or masses felt.  Normal bowel sounds heard. Central nervous system: Alert and oriented. No focal neurological deficits. Musculoskeletal: BLE tenderness with trace - 1+  edema Skin: No cyanosis. No rashes Psychiatry: Judgement and insight appear normal. Mood & affect appropriate.     Data Reviewed: I have personally reviewed following labs and imaging studies  CBC Lab Results  Component Value Date   WBC 15.7 (H) 08/06/2021   RBC 2.85 (L) 08/06/2021   HGB 9.0 (L) 08/06/2021   HCT 28.3 (L) 08/06/2021   MCV 99.3 08/06/2021   MCH 31.6 08/06/2021   PLT 235 08/06/2021   MCHC 31.8 08/06/2021   RDW 14.2 08/06/2021   LYMPHSABS 1.3 08/06/2021   MONOABS 1.1 (H) 08/06/2021   EOSABS 0.0 08/06/2021   BASOSABS 0.0 57/32/2025     Last metabolic panel Lab Results  Component Value Date   NA 138 08/05/2021   K 4.0 08/05/2021   CL 107 08/05/2021   CO2 25 08/05/2021   BUN 22 08/05/2021   CREATININE 1.03 (H) 08/05/2021   GLUCOSE 132 (H) 08/05/2021   GFRNONAA 54 (L) 08/05/2021   GFRAA 25 (L) 12/25/2020   CALCIUM 8.9 08/05/2021   PROT 5.8 (L) 07/31/2021   ALBUMIN 2.6 (L) 07/31/2021   BILITOT 0.5 07/31/2021   ALKPHOS 67 07/31/2021   AST 35 07/31/2021   ALT 34 07/31/2021   ANIONGAP 6 08/05/2021    CBG (last 3)  No results for input(s): GLUCAP in the last 72 hours.   GFR: Estimated Creatinine Clearance: 36.2 mL/min (A) (by C-G formula based on SCr of 1.03 mg/dL (H)).  Coagulation Profile: No results for input(s): INR, PROTIME in the last 168 hours.  Recent Results (from the past 240 hour(s))  Culture, blood (routine x 2)     Status: None   Collection Time: 07/29/21  3:37 PM   Specimen: BLOOD LEFT WRIST  Result Value Ref Range Status   Specimen Description   Final    BLOOD LEFT WRIST Performed at [facility] [address] [place], [place] [address]    Special Requests   Final    BOTTLES DRAWN AEROBIC ONLY Blood Culture results may not be optimal due to an inadequate volume of blood received in culture bottles Performed at [facility] [address] [place], [place] [address]    Culture   Final     NO GROWTH 5 DAYS Performed at [facility], [place] [address], [place], [place] [address]    Report Status 08/03/2021 FINAL  Final  Culture, blood (routine x 2)     Status: None   Collection Time: 07/29/21  3:38 PM   Specimen: BLOOD  Result Value Ref Range Status   Specimen Description   Final    BLOOD LEFT ANTECUBITAL Performed at [facility] [address], [place], [place] [address]    Special Requests   Final    BOTTLES DRAWN AEROBIC ONLY Blood Culture results may not be optimal due to an inadequate volume of blood received in culture bottles Performed at [facility] [address], [place], [place] [address]    Culture   Final    NO GROWTH 5 DAYS Performed at [facility], [place] [address] [place], [place] [address]    Report Status 08/03/2021 FINAL  Final  Respiratory (~20 pathogens) panel by PCR     Status: None   Collection Time: 07/29/21  5:46 PM   Specimen: Nasopharyngeal Swab; Respiratory  Result Value Ref Range Status   Adenovirus NOT DETECTED NOT DETECTED Final   Coronavirus 229E NOT DETECTED NOT DETECTED Final    Comment: (NOTE) The Coronavirus on the Respiratory Panel, DOES NOT test for the novel  Coronavirus (2019 nCoV)    Coronavirus HKU1 NOT DETECTED NOT DETECTED Final   Coronavirus NL63 NOT DETECTED NOT DETECTED Final   Coronavirus OC43 NOT DETECTED NOT DETECTED Final   Metapneumovirus NOT DETECTED NOT DETECTED Final   Rhinovirus / Enterovirus NOT DETECTED NOT DETECTED Final   Influenza A NOT DETECTED NOT DETECTED Final   Influenza B NOT DETECTED NOT DETECTED Final   Parainfluenza Virus 1 NOT DETECTED NOT DETECTED Final   Parainfluenza Virus 2 NOT DETECTED NOT DETECTED Final   Parainfluenza Virus 3 NOT DETECTED NOT DETECTED Final   Parainfluenza Virus 4 NOT DETECTED NOT DETECTED Final   Respiratory Syncytial Virus NOT DETECTED NOT DETECTED Final   Bordetella pertussis NOT DETECTED NOT DETECTED Final    Bordetella Parapertussis NOT DETECTED NOT DETECTED Final   Chlamydophila pneumoniae NOT DETECTED NOT DETECTED Final   Mycoplasma pneumoniae NOT DETECTED NOT DETECTED Final    Comment: Performed at [facility], [place] [address], [place], [place] [address]  CSF culture w Gram Stain     Status: None   Collection Time: 07/30/21 12:30 PM   Specimen: CSF; Cerebrospinal Fluid  Result Value Ref Range Status   Specimen Description   Final    CSF Performed at [facility] [address], [place], [place] [address]    Special Requests   Final    NONE Performed at [facility], [place] [address], [place], [place] [address]    Gram Stain   Final    WBC PRESENT, PREDOMINANTLY MONONUCLEAR NO ORGANISMS SEEN CYTOSPIN SMEAR Gram Stain Report Called to,Read Back By and Verified With: B.CATES LAND, RN AT 2725 ON 10.05.22 BY N.THOMPSON Performed at [facility] [address] [place], [place] [address]    Culture   Final    NO GROWTH 3 DAYS Performed at [facility], [place] [address] [place], [place] [address]    Report Status 08/03/2021 FINAL  Final  Culture, fungus without smear     Status: None (Preliminary result)   Collection Time: 07/30/21 12:30 PM   Specimen: CSF; Cerebrospinal Fluid  Result Value Ref Range Status   Specimen Description   Final    CSF Performed at [facility] [address] [place], [place] [address]    Special Requests   Final    NONE Performed at [facility], [place] [address] [place], [place] [address]    Culture   Final    NO FUNGUS ISOLATED AFTER 8 DAYS Performed at [facility], [place] [address] [place],  [address]    Report Status PENDING  Incomplete  SARS CORONAVIRUS 2 (TAT 6-24 HRS) Nasopharyngeal Nasopharyngeal Swab     Status: None   Collection Time: 08/05/21 12:13 PM   Specimen: Nasopharyngeal Swab  Result Value Ref Range Status   SARS  Coronavirus 2 NEGATIVE NEGATIVE Final    Comment: (NOTE) SARS-CoV-2 target nucleic acids are NOT DETECTED.  The SARS-CoV-2 RNA is generally detectable in upper and lower respiratory specimens during the acute phase of infection. Negative results do not preclude SARS-CoV-2 infection, do not rule out co-infections with other pathogens, and should not be used as the sole basis for treatment or other patient  management decisions. Negative results must be combined with clinical observations, patient history, and epidemiological information. The expected result is Negative.  Fact Sheet for Patients: SugarRoll.be  Fact Sheet for Healthcare Providers: https://www.woods-mathews.com/  This test is not yet approved or cleared by the [place] FDA and  has been authorized for detection and/or diagnosis of SARS-CoV-2 by FDA under an Emergency Use Authorization (EUA). This EUA will remain  in effect (meaning this test can be used) for the duration of the COVID-19 declaration under Se ction 564(b)(1) of the Act, 21 U.S.C. section 360bbb-3(b)(1), unless the authorization is terminated or revoked sooner.  Performed at [facility], [place] [address] [place], [place] [address]         Radiology Studies: No results found.      Scheduled Meds:  aspirin EC  81 mg Oral q morning   enoxaparin (LOVENOX) injection  40 mg Subcutaneous Q24H   feeding supplement  237 mL Oral BID BM   mometasone-formoterol  2 puff Inhalation BID   montelukast  10 mg Oral QHS   multivitamin with minerals  1 tablet Oral Daily   pantoprazole  40 mg Oral Daily   predniSONE  6 mg Oral Q breakfast   Continuous Infusions:  sodium chloride 10 mL/hr at 08/03/21 1716     LOS: 9 days     Cordelia Poche, MD [facility] 08/07/2021, 3:00 PM  If 7PM-7AM, please contact night-coverage [email or web address]

## 2021-08-08 DIAGNOSIS — I110 Hypertensive heart disease with heart failure: Secondary | ICD-10-CM | POA: Diagnosis not present

## 2021-08-08 DIAGNOSIS — M6281 Muscle weakness (generalized): Secondary | ICD-10-CM | POA: Diagnosis not present

## 2021-08-08 DIAGNOSIS — N281 Cyst of kidney, acquired: Secondary | ICD-10-CM | POA: Diagnosis not present

## 2021-08-08 DIAGNOSIS — I130 Hypertensive heart and chronic kidney disease with heart failure and stage 1 through stage 4 chronic kidney disease, or unspecified chronic kidney disease: Secondary | ICD-10-CM | POA: Diagnosis not present

## 2021-08-08 DIAGNOSIS — G9341 Metabolic encephalopathy: Secondary | ICD-10-CM | POA: Diagnosis not present

## 2021-08-08 DIAGNOSIS — H11823 Conjunctivochalasis, bilateral: Secondary | ICD-10-CM | POA: Diagnosis not present

## 2021-08-08 DIAGNOSIS — N184 Chronic kidney disease, stage 4 (severe): Secondary | ICD-10-CM | POA: Diagnosis not present

## 2021-08-08 DIAGNOSIS — R404 Transient alteration of awareness: Secondary | ICD-10-CM | POA: Diagnosis not present

## 2021-08-08 DIAGNOSIS — Z961 Presence of intraocular lens: Secondary | ICD-10-CM | POA: Diagnosis not present

## 2021-08-08 DIAGNOSIS — R600 Localized edema: Secondary | ICD-10-CM | POA: Diagnosis not present

## 2021-08-08 DIAGNOSIS — G894 Chronic pain syndrome: Secondary | ICD-10-CM | POA: Diagnosis not present

## 2021-08-08 DIAGNOSIS — N189 Chronic kidney disease, unspecified: Secondary | ICD-10-CM | POA: Diagnosis not present

## 2021-08-08 DIAGNOSIS — R3129 Other microscopic hematuria: Secondary | ICD-10-CM | POA: Diagnosis not present

## 2021-08-08 DIAGNOSIS — R2681 Unsteadiness on feet: Secondary | ICD-10-CM | POA: Diagnosis not present

## 2021-08-08 DIAGNOSIS — I10 Essential (primary) hypertension: Secondary | ICD-10-CM | POA: Diagnosis not present

## 2021-08-08 DIAGNOSIS — H04123 Dry eye syndrome of bilateral lacrimal glands: Secondary | ICD-10-CM | POA: Diagnosis not present

## 2021-08-08 DIAGNOSIS — Z743 Need for continuous supervision: Secondary | ICD-10-CM | POA: Diagnosis not present

## 2021-08-08 DIAGNOSIS — R5381 Other malaise: Secondary | ICD-10-CM | POA: Diagnosis not present

## 2021-08-08 DIAGNOSIS — R7303 Prediabetes: Secondary | ICD-10-CM | POA: Diagnosis not present

## 2021-08-08 DIAGNOSIS — I509 Heart failure, unspecified: Secondary | ICD-10-CM | POA: Diagnosis not present

## 2021-08-08 DIAGNOSIS — J45909 Unspecified asthma, uncomplicated: Secondary | ICD-10-CM | POA: Diagnosis not present

## 2021-08-08 DIAGNOSIS — R809 Proteinuria, unspecified: Secondary | ICD-10-CM | POA: Diagnosis not present

## 2021-08-08 DIAGNOSIS — H10413 Chronic giant papillary conjunctivitis, bilateral: Secondary | ICD-10-CM | POA: Diagnosis not present

## 2021-08-08 DIAGNOSIS — R531 Weakness: Secondary | ICD-10-CM | POA: Diagnosis not present

## 2021-08-08 DIAGNOSIS — R6889 Other general symptoms and signs: Secondary | ICD-10-CM | POA: Diagnosis not present

## 2021-08-08 DIAGNOSIS — I129 Hypertensive chronic kidney disease with stage 1 through stage 4 chronic kidney disease, or unspecified chronic kidney disease: Secondary | ICD-10-CM | POA: Diagnosis not present

## 2021-08-08 DIAGNOSIS — I5032 Chronic diastolic (congestive) heart failure: Secondary | ICD-10-CM | POA: Diagnosis not present

## 2021-08-08 DIAGNOSIS — N1832 Chronic kidney disease, stage 3b: Secondary | ICD-10-CM | POA: Diagnosis not present

## 2021-08-08 DIAGNOSIS — E871 Hypo-osmolality and hyponatremia: Secondary | ICD-10-CM | POA: Diagnosis not present

## 2021-08-08 DIAGNOSIS — H43813 Vitreous degeneration, bilateral: Secondary | ICD-10-CM | POA: Diagnosis not present

## 2021-08-08 LAB — RESP PANEL BY RT-PCR (FLU A&B, COVID) ARPGX2
Influenza A by PCR: NEGATIVE
Influenza B by PCR: NEGATIVE
SARS Coronavirus 2 by RT PCR: NEGATIVE

## 2021-08-08 MED ORDER — VITAMIN D3 20 MCG (800 UNIT) PO TABS: ORAL_TABLET | ORAL | Status: AC

## 2021-08-08 MED ORDER — FUROSEMIDE 40 MG PO TABS: 40.0000 mg | ORAL_TABLET | ORAL | Status: AC

## 2021-08-08 NOTE — Progress Notes (Signed)
Patient resting well tonight. She was somewhat agitated at the beginning of the shift when she not sure who the RT was and what he was doing. She repeatedly ask, " am I supposed to be doing this" as the therapist attempted administer medications. I assure her that he was doing what is supposed to do. She confused about what she was doing and whee is was. RN will continue to monitor.

## 2021-08-08 NOTE — Discharge Instructions (Signed)
[name],  You were in the hospital with significant confusion. There was a concern of a possible viral illness but this was not confirmed. You were also on Tramadol ER which, which your kidney function, may have built up to high levels and contributed to your symptoms. Thankfully you have improved. I have recommended for you to discontinue your Tramadol. If necessary for some pain medication, the non-ER version can be considered or a different pain medication, like Tylenol. I have also discontinued your losartan because of your kidney function and blood pressure being controlled enough; this may need to be adjusted, though and I would like you to discuss with your PCP.

## 2021-08-08 NOTE — Care Management Important Message (Signed)
Important Message  Patient Details IM Letter given to the Patient. Name: [name] MRN: [id number] Date of Birth: Nov 18, 1935   Medicare Important Message Given:  Yes     Kerin Salen 08/08/2021, 11:10 AM

## 2021-08-08 NOTE — TOC Transition Note (Signed)
Transition of Care [facility]) - CM/SW Discharge Note   Patient Details  Name: DOMINIQUA COONER MRN: [id number] Date of Birth: 10/16/1936  Transition [facility]) CM/SW Contact:  Trish Mage, LCSW Phone Number: 08/08/2021, 11:20 AM   Clinical Narrative:   Patient who is stable for d/c will transfer to [facility] in [place].  Family informed.  PTAR arranged.  Nursing, please call report to [phone], room 303A. TOC sign off.    Final next level of care: Skilled Nursing Facility Barriers to Discharge: Barriers Resolved   Patient Goals and CMS Choice     Choice offered to / list presented to : Patient, Adult Children  Discharge Placement                       Discharge Plan and Services   Discharge Planning Services: CM Consult Post Acute Care Choice: [place]                               Social Determinants of Health (SDOH) Interventions     Readmission Risk Interventions No flowsheet data found.

## 2021-08-08 NOTE — Discharge Summary (Addendum)
Physician Discharge Summary  [name] CNO:[id number] DOB: 1936-07-21 DOA: 07/27/2021  PCP: Biagio Borg, MD  Admit date: 07/27/2021 Discharge date: 08/08/2021  Admitted From: Home Disposition: SNF  Recommendations for Outpatient Follow-up:  Follow up with PCP in 1 week Please follow up on the following pending results: None   Discharge Condition: Stable CODE STATUS: Full code Diet recommendation: Regular diet   Brief/Interim Summary:  Admission HPI written by Etta Quill, DO   HPI: [name] is a 85 y.o. female with medical history significant of dCHF, HTN, CKD stage 3-4, chronic steroid use.   Pt with generalized weakness and fatigue for a couple of weeks now.  Waxing and waning mental status per family.  ? If MCI.   Pt presents to the ED with c/o fall vs syncope with AMS.   At baseline lives alone, walks with walker.   Daughter last spoke with pt yesterday afternoon.  Called pt today, pt wasn't answering phone so daughter went to house.  Found pt on floor, down for unknown period of time.  Pt was apparently confused, didn't even realize she had fallen or was on floor.   Mental status had improved and she was AAOX3 by time of arrival to ED though still with slow mentation.   ROS positive for malodorous urine per family.   Pt denies any complaints: no headache, CP, SOB, abd pain, back pain.    Hospital course:  Acute metabolic encephalopathy Initial concern for meningitis. Infectious disease and neurology consulted. Patient was empirically treated with Vancomycin, Ceftriaxone, ampicillin and Acyclovir. LP obtained on 10/5 significant for elevated protein. CSF HSV pcr was negative. CSF meningoencephalitis pcr negative. With improvement of mental status and negative workup, antiinfectives were discontinued on 10/6. Blood and urine cultures negative. MRI without classic HSV changes or other acute process to explain encephalopathy. Possibly  secondary to unspecified viral illness but could also be secondary to Tramadol ER use in setting of CKD stage IV. Resolved.   Hypokalemia Repleted. Resolved.   Leukocytosis Max of 29,800. Likely related to underlying unspecified infection. Improved during treatment for above. Still with mildly elevated WBC but no evidence/symptoms of infection. Possibly related to prednisone use.   Possible sepsis Unsure if this was present or not. Workup was negative for identification of an infectious pathogen however clinical course is suggestive of infectious process. Cannot rule out sepsis. Currently resolved.   Rhabdomyolysis Secondary to fall and being found down. CK of 1200 on admission with resolution. Treated with IV fluids. No associated AKI.   Chronic diastolic heart failure Stable. Euvolemic.   CKD stage IV Baseline creatinine of about 1.6. Currently below baseline.   Primary hypertension Patient is on amlodipine as an outpatient.   Left foot pain Arthritis noted on x-ray.   Inflammatory arthritis Continue prednisone 6 mg daily   Asthma exacerbation Mild during admission. Treated with Albuterol prn. Weaned to room air. Wheezing resolved. Continue home albuterol, Advair and Singulair.   Atelectasis Incentive spirometer  Discharge Diagnoses:  Principal Problem:   Altered mental status Active Problems:   Essential hypertension   CKD (chronic kidney disease) stage 4, GFR 15-29 ml/min (HCC)   Chronic pain   Long term (current) use of systemic steroids   Chronic diastolic CHF (congestive heart failure) (HCC)   AMS (altered mental status)   Metabolic encephalopathy   Sepsis with encephalopathy without septic shock [facility])    Discharge Instructions   Allergies as of 08/08/2021  Reactions   Hydrocodone Itching   Lasix [furosemide] Other (See Comments)   Dizziness.    Tizanidine Other (See Comments)   Dizzy and fall   Iron Hives, Other (See Comments)    bad  constipation        Medication List     STOP taking these medications    losartan 100 MG tablet Commonly known as: COZAAR   traMADol 200 MG 24 hr tablet Commonly known as: ULTRAM-ER       TAKE these medications    albuterol 108 (90 Base) MCG/ACT inhaler Commonly known as: VENTOLIN HFA Inhale 1 puff into the lungs every 6 (six) hours as needed for wheezing or shortness of breath.   amLODipine 5 MG tablet Commonly known as: NORVASC TAKE 1 TABLET BY MOUTH EVERY DAY What changed: when to take this   aspirin EC 81 MG tablet Take 81 mg by mouth every morning. Swallow whole.   Depend Underwear Sm/Med Misc Use as directed four times per day   diclofenac Sodium 1 % Gel Commonly known as: VOLTAREN APPLY 2 GRAMS TO AFFECTED AREA 4 TIMES A DAY What changed:  how much to take how to take this when to take this reasons to take this additional instructions   Ensure Take 1 Can by mouth 3 (three) times daily between meals. What changed:  when to take this additional instructions   fluticasone-salmeterol 250-50 MCG/ACT Aepb Commonly known as: Advair Diskus Inhale 1 puff into the lungs in the morning and at bedtime.   furosemide 40 MG tablet Commonly known as: LASIX Take 1 tablet (40 mg total) by mouth every Monday, Wednesday, and Friday.   gabapentin 300 MG capsule Commonly known as: NEURONTIN TAKE 1 CAPSULE BY MOUTH THREE TIMES A DAY What changed: See the new instructions.   Magnesium Gluconate 500 (27 Mg) MG Tabs Take 500 mg by mouth 2 (two) times daily.   meclizine 12.5 MG tablet Commonly known as: ANTIVERT TAKE 1 TABLET BY MOUTH THREE TIMES A DAY AS NEEDED FOR DIZZINESS What changed: See the new instructions.   montelukast 10 MG tablet Commonly known as: SINGULAIR Take 10 mg by mouth at bedtime.   multivitamin with minerals Tabs tablet Take 1 tablet by mouth daily. Centrum Silver   pantoprazole 40 MG tablet Commonly known as: PROTONIX Take 40 mg by  mouth daily.   potassium chloride 10 MEQ tablet Commonly known as: KLOR-CON TAKE 1 TABLET BY MOUTH EVERY DAY WHEN TAKING FUROSEMIDE What changed: See the new instructions.   predniSONE 1 MG tablet Commonly known as: DELTASONE Take 1 tablet (1 mg total) by mouth daily with breakfast. Along with 5mg  to equal 6mg  by mouth daily. What changed: additional instructions   predniSONE 5 MG tablet Commonly known as: DELTASONE TAKE 1 TABLET BY MOUTH EVERY DAY WITH BREAKFAST What changed: See the new instructions.   triamcinolone cream 0.1 % Commonly known as: KENALOG APPLY TO AFFECTED AREA TWICE A DAY What changed: See the new instructions.   Vitamin D3 20 MCG (800 UNIT) Tabs Take 1 tablet by mouth daily. What changed:  medication strength how much to take how to take this when to take this additional instructions        Contact information for after-discharge care     Destination     [facility] .   Service: Skilled Chiropodist information: [address] [place] [phone]  Allergies  Allergen Reactions   Hydrocodone Itching   Lasix [Furosemide] Other (See Comments)    Dizziness.    Tizanidine Other (See Comments)    Dizzy and fall   Iron Hives and Other (See Comments)     bad constipation     Consultations: Infectious disease Neurology   Procedures/Studies: DG Chest 2 View  Result Date: 07/27/2021 CLINICAL DATA:  Fall. EXAM: CHEST - 2 VIEW COMPARISON:  Chest x-ray 06/18/2021. FINDINGS: The heart size and mediastinal contours are within normal limits. Both lungs are clear. The visualized skeletal structures are unremarkable. IMPRESSION: No active cardiopulmonary disease. Electronically Signed   By: Ronney Asters M.D.   On: 07/27/2021 17:54   DG Pelvis 1-2 Views  Result Date: 07/27/2021 CLINICAL DATA:  Fall. EXAM: PELVIS - 1-2 VIEW COMPARISON:  Right hip x-ray  06/27/2021. FINDINGS: There is no evidence of pelvic fracture or diastasis. No pelvic bone lesions are seen. There are degenerative changes of the lower lumbar spine and sacroiliac joints. Vascular calcifications are noted in the soft tissues. There is a large amount of stool in the rectum. IMPRESSION: Negative. Electronically Signed   By: Ronney Asters M.D.   On: 07/27/2021 17:55   CT HEAD WO CONTRAST (5MM)  Result Date: 07/27/2021 CLINICAL DATA:  Fall with head and neck injury. EXAM: CT HEAD WITHOUT CONTRAST CT CERVICAL SPINE WITHOUT CONTRAST TECHNIQUE: Multidetector CT imaging of the head and cervical spine was performed following the standard protocol without intravenous contrast. Multiplanar CT image reconstructions of the cervical spine were also generated. COMPARISON:  CT head and cervical spine dated 01/28/2018. FINDINGS: CT HEAD FINDINGS Brain: No evidence of acute infarction, hemorrhage, hydrocephalus, extra-axial collection or mass lesion/mass effect. Periventricular white matter hypoattenuation likely represents chronic small vessel ischemic disease. There is mild cerebral volume loss with associated ex vacuo dilatation. Vascular: There are vascular calcifications in the carotid siphons. Skull: Normal. Negative for fracture or focal lesion. Sinuses/Orbits: No acute finding. Other: None. CT CERVICAL SPINE FINDINGS Alignment: There is 4 mm anterolisthesis of C3 on C4, unchanged. There is mild dextrocurvature centered at C3-4 which is similar to prior exam and may be positional. Skull base and vertebrae: No acute fracture. No primary bone lesion or focal pathologic process. Soft tissues and spinal canal: No prevertebral fluid or swelling. No visible canal hematoma. Disc levels: Up to severe multilevel degenerative disc and joint disease. Upper chest: Negative. Other: None. IMPRESSION: 1. No acute intracranial process. 2. No acute osseous injury in the cervical spine. Electronically Signed   By: Zerita Boers M.D.   On: 07/27/2021 19:02   CT Cervical Spine Wo Contrast  Result Date: 07/27/2021 CLINICAL DATA:  Fall with head and neck injury. EXAM: CT HEAD WITHOUT CONTRAST CT CERVICAL SPINE WITHOUT CONTRAST TECHNIQUE: Multidetector CT imaging of the head and cervical spine was performed following the standard protocol without intravenous contrast. Multiplanar CT image reconstructions of the cervical spine were also generated. COMPARISON:  CT head and cervical spine dated 01/28/2018. FINDINGS: CT HEAD FINDINGS Brain: No evidence of acute infarction, hemorrhage, hydrocephalus, extra-axial collection or mass lesion/mass effect. Periventricular white matter hypoattenuation likely represents chronic small vessel ischemic disease. There is mild cerebral volume loss with associated ex vacuo dilatation. Vascular: There are vascular calcifications in the carotid siphons. Skull: Normal. Negative for fracture or focal lesion. Sinuses/Orbits: No acute finding. Other: None. CT CERVICAL SPINE FINDINGS Alignment: There is 4 mm anterolisthesis of C3 on C4, unchanged. There is mild dextrocurvature centered at C3-4  which is similar to prior exam and may be positional. Skull base and vertebrae: No acute fracture. No primary bone lesion or focal pathologic process. Soft tissues and spinal canal: No prevertebral fluid or swelling. No visible canal hematoma. Disc levels: Up to severe multilevel degenerative disc and joint disease. Upper chest: Negative. Other: None. IMPRESSION: 1. No acute intracranial process. 2. No acute osseous injury in the cervical spine. Electronically Signed   By: Zerita Boers M.D.   On: 07/27/2021 19:02   MR BRAIN WO CONTRAST  Result Date: 07/28/2021 CLINICAL DATA:  Mental status changes of unknown cause. EXAM: MRI HEAD WITHOUT CONTRAST TECHNIQUE: Multiplanar, multiecho pulse sequences of the brain and surrounding structures were obtained without intravenous contrast. COMPARISON:  Head CT yesterday.  FINDINGS: Brain: Diffusion imaging does not show any acute or subacute infarction. No focal abnormality affects the brainstem or cerebellum. Cerebral hemispheres show mild age related volume loss with mild to moderate chronic small-vessel ischemic change of the white matter. No cortical or large vessel territory infarction. No mass lesion, hemorrhage, hydrocephalus or extra-axial collection. Vascular: Major vessels at the base of the brain show flow. Skull and upper cervical spine: Negative Sinuses/Orbits: Clear/normal Other: None IMPRESSION: No acute or reversible finding. Mild age related volume loss. Mild to moderate chronic small-vessel ischemic changes of the cerebral hemispheric white matter. Electronically Signed   By: Nelson Chimes M.D.   On: 07/28/2021 08:03   DG CHEST PORT 1 VIEW  Result Date: 08/05/2021 CLINICAL DATA:  Shortness of breath EXAM: PORTABLE CHEST 1 VIEW COMPARISON:  Radiograph 08/04/2021 FINDINGS: Unchanged cardiomediastinal silhouette. Low lung volumes with bibasilar opacities. No large pleural effusion or visible pneumothorax. Bilateral glenohumeral osteoarthritis. No acute osseous abnormality. IMPRESSION: Low lung volumes with bibasilar opacities, likely atelectasis. No new airspace disease. Electronically Signed   By: Maurine Simmering M.D.   On: 08/05/2021 15:06   DG CHEST PORT 1 VIEW  Result Date: 08/04/2021 CLINICAL DATA:  Shortness of breath. EXAM: PORTABLE CHEST 1 VIEW COMPARISON:  July 27, 2021. FINDINGS: The heart size and mediastinal contours are within normal limits. Hypoinflation of the lungs is noted with mild bibasilar subsegmental atelectasis. The visualized skeletal structures are unremarkable. IMPRESSION: Hypoinflation of the lungs with mild bibasilar subsegmental atelectasis. Electronically Signed   By: Marijo Conception M.D.   On: 08/04/2021 16:40   DG Foot 2 Views Left  Result Date: 08/03/2021 CLINICAL DATA:  Generalized foot pain.  Unable to move foot. EXAM: LEFT  FOOT - 2 VIEW COMPARISON:  08/20/2020 FINDINGS: Diffuse bone demineralization. Prominent hallux valgus deformity with degenerative changes in the first metatarsal-phalangeal joint. Degenerative changes also seen in the interphalangeal and intertarsal joints. The configuration of foot suggests valgus orientation with pes planus although this could be positional. No acute fractures identified. No focal bone erosion. Vascular calcifications in the soft tissues. IMPRESSION: Degenerative changes in the left foot. Hallux valgus deformity. Midfoot valgus and pes planus. No acute bony abnormalities. Electronically Signed   By: Lucienne Capers M.D.   On: 08/03/2021 20:32   ECHOCARDIOGRAM COMPLETE  Result Date: 07/28/2021    ECHOCARDIOGRAM REPORT   Patient Name:   NAYELY DINGUS Date of Exam: 07/28/2021 Medical Rec #:  [id number]            Height:       60.0 in Accession #:    [id number]           Weight:       155.0 lb Date of Birth:  1936-04-05             BSA:          1.675 m Patient Age:    43 years             BP:           131/72 mmHg Patient Gender: F                    HR:           105 bpm. Exam Location:  Inpatient Procedure: 2D Echo, Cardiac Doppler, Color Doppler and Intracardiac            Opacification Agent Indications:    Elevated Troponin                 Syncope R55  History:        Patient has prior history of Echocardiogram examinations, most                 recent 03/07/2018. CHF; Risk Factors:Hypertension. Chronic Kidney                 Disease. Asthma. GERD.  Sonographer:    Darlina Sicilian RDCS Referring Phys: [place]  1. Left ventricular ejection fraction, by estimation, is 70 to 75%. The left ventricle has hyperdynamic function. The left ventricle has no regional wall motion abnormalities. There is mild left ventricular hypertrophy. Left ventricular diastolic parameters are consistent with Grade I diastolic dysfunction (impaired relaxation).  2. Right ventricular  systolic function is normal. The right ventricular size is normal.  3. The mitral valve is normal in structure. No evidence of mitral valve regurgitation. No evidence of mitral stenosis.  4. The aortic valve is tricuspid. Aortic valve regurgitation is not visualized. Mild aortic valve sclerosis is present, with no evidence of aortic valve stenosis.  5. The inferior vena cava is normal in size with greater than 50% respiratory variability, suggesting right atrial pressure of 3 mmHg. FINDINGS  Left Ventricle: Left ventricular ejection fraction, by estimation, is 70 to 75%. The left ventricle has hyperdynamic function. The left ventricle has no regional wall motion abnormalities. Definity contrast agent was given IV to delineate the left ventricular endocardial borders. The left ventricular internal cavity size was normal in size. There is mild left ventricular hypertrophy. Left ventricular diastolic parameters are consistent with Grade I diastolic dysfunction (impaired relaxation). Right Ventricle: The right ventricular size is normal. Right ventricular systolic function is normal. Left Atrium: Left atrial size was normal in size. Right Atrium: Right atrial size was normal in size. Pericardium: Trivial pericardial effusion is present. Mitral Valve: The mitral valve is normal in structure. No evidence of mitral valve regurgitation. No evidence of mitral valve stenosis. Tricuspid Valve: The tricuspid valve is normal in structure. Tricuspid valve regurgitation is mild . No evidence of tricuspid stenosis. Aortic Valve: The aortic valve is tricuspid. Aortic valve regurgitation is not visualized. Mild aortic valve sclerosis is present, with no evidence of aortic valve stenosis. Pulmonic Valve: The pulmonic valve was normal in structure. Pulmonic valve regurgitation is trivial. No evidence of pulmonic stenosis. Aorta: The aortic root is normal in size and structure. Venous: The inferior vena cava is normal in size with  greater than 50% respiratory variability, suggesting right atrial pressure of 3 mmHg. IAS/Shunts: The interatrial septum is aneurysmal. The interatrial septum was not well visualized.  LEFT VENTRICLE PLAX 2D LVIDd:  3.00 cm  Diastology LVIDs:         1.80 cm  LV e' medial:    4.57 cm/s LV PW:         1.20 cm  LV E/e' medial:  10.9 LV IVS:        1.30 cm  LV e' lateral:   4.79 cm/s LVOT diam:     2.10 cm  LV E/e' lateral: 10.4 LV SV:         35 LV SV Index:   21 LVOT Area:     3.46 cm  RIGHT VENTRICLE RV S prime:     17.80 cm/s TAPSE (M-mode): 1.5 cm LEFT ATRIUM           Index LA Vol (A4C): 23.8 ml 14.21 ml/m  AORTIC VALVE LVOT Vmax:   92.00 cm/s LVOT Vmean:  61.100 cm/s LVOT VTI:    0.100 m  AORTA Ao Root diam: 3.10 cm Ao Asc diam:  2.90 cm MITRAL VALVE MV Area (PHT): 3.74 cm    SHUNTS MV Decel Time: 203 msec    Systemic VTI:  0.10 m MV E velocity: 49.70 cm/s  Systemic Diam: 2.10 cm MV A velocity: 72.80 cm/s MV E/A ratio:  0.68 Kirk Ruths MD Electronically signed by Kirk Ruths MD Signature Date/Time: 07/28/2021/1:56:43 PM    Final    DG FLUORO GUIDE LUMBAR PUNCTURE  Result Date: 07/30/2021 CLINICAL DATA:  85 year old female referred for lumbar puncture EXAM: DIAGNOSTIC LUMBAR PUNCTURE UNDER FLUOROSCOPIC GUIDANCE COMPARISON:  MR lumbar 12/27/2017 FLUOROSCOPY TIME:  Fluoroscopy Time:  8 minutes 30 seconds PROCEDURE: Informed consent was obtained from the patient prior to the procedure by Dr. Clovis Riley, including potential complications of headache, allergy, and pain. With the patient prone, the lower back was prepped with Betadine. 1% Lidocaine was used for local anesthesia. Lumbar puncture was attempted at the L2-L3 level using a 20 gauge needle. Dr. Earleen Newport then joined Dr. Clovis Riley for further attempt. Additional site was attempted with further administration of 1% lidocaine at the L3-L4 level. The patient was uncomfortable in this position, and was reposition. This necessitated removal of the  needle. Additional 1% lidocaine was then administered for attempt at the L4-L5 level. Again, 20 gauge needle was used for placement under fluoroscopy. Once there was return of CSF, approximately 9 cc of CSF were obtained for laboratory studies. The patient tolerated the procedure well and there were no apparent complications. IMPRESSION: Fluoro guided LP, successful at L4-L5 as above. Electronically Signed   By: Corrie Mckusick D.O.   On: 07/30/2021 13:41      Subjective: No issues this morning. No dyspnea or chest pain.  Discharge Exam: Vitals:   08/08/21 0410 08/08/21 0814  BP: (!) 158/84   Pulse: 75   Resp: (!) 22   Temp: 98.2 F (36.8 C)   SpO2: 95% 94%   Vitals:   08/07/21 2019 08/07/21 2036 08/08/21 0410 08/08/21 0814  BP:  (!) 144/87 (!) 158/84   Pulse:  100 75   Resp:  (!) 22 (!) 22   Temp:  98.1 F (36.7 C) 98.2 F (36.8 C)   TempSrc:  Oral    SpO2: 93% 96% 95% 94%  Weight:      Height:        General: Pt is alert, awake, not in acute distress Cardiovascular: RRR, S1/S2 +, no rubs, no gallops Respiratory: Mild left basilar rales, no wheezing, no rhonchi Abdominal: Soft, NT, ND, bowel sounds + Extremities: circumferential BLE  tenderness with edema noted    The results of significant diagnostics from this hospitalization (including imaging, microbiology, ancillary and laboratory) are listed below for reference.     Microbiology: Recent Results (from the past 240 hour(s))  Culture, blood (routine x 2)     Status: None   Collection Time: 07/29/21  3:37 PM   Specimen: BLOOD LEFT WRIST  Result Value Ref Range Status   Specimen Description   Final    BLOOD LEFT WRIST Performed at [facility] [address], [place], [place] [address]    Special Requests   Final    BOTTLES DRAWN AEROBIC ONLY Blood Culture results may not be optimal due to an inadequate volume of blood received in culture bottles Performed at [facility] [address], [place], [place] [address]    Culture   Final    NO GROWTH 5 DAYS Performed at [facility], [place] [address] [place], [place] [address]    Report Status 08/03/2021 FINAL  Final  Culture, blood (routine x 2)     Status: None   Collection Time: 07/29/21  3:38 PM   Specimen: BLOOD  Result Value Ref Range Status   Specimen Description   Final    BLOOD LEFT ANTECUBITAL Performed at [facility] [address] [place], [place] [address]    Special Requests   Final    BOTTLES DRAWN AEROBIC ONLY Blood Culture results may not be optimal due to an inadequate volume of blood received in culture bottles Performed at [facility] [address], [place], [place] [address]    Culture   Final    NO GROWTH 5 DAYS Performed at [facility], [place] [address] [place],  [place] [address]    Report Status 08/03/2021 FINAL  Final  Respiratory (~20 pathogens) panel by PCR     Status: None   Collection Time: 07/29/21  5:46 PM   Specimen: Nasopharyngeal Swab; Respiratory  Result Value Ref Range Status   Adenovirus NOT DETECTED NOT DETECTED Final   Coronavirus 229E NOT DETECTED NOT DETECTED Final    Comment: (NOTE) The Coronavirus on the Respiratory Panel, DOES NOT test for the novel  Coronavirus (2019 nCoV)    Coronavirus HKU1 NOT DETECTED NOT DETECTED Final   Coronavirus NL63 NOT DETECTED NOT DETECTED Final   Coronavirus OC43 NOT DETECTED NOT DETECTED Final   Metapneumovirus NOT DETECTED NOT DETECTED Final   Rhinovirus / Enterovirus NOT DETECTED NOT DETECTED Final   Influenza A NOT DETECTED NOT DETECTED Final   Influenza B NOT DETECTED NOT DETECTED Final   Parainfluenza Virus 1 NOT DETECTED NOT DETECTED Final   Parainfluenza Virus 2 NOT DETECTED NOT DETECTED Final   Parainfluenza Virus 3 NOT DETECTED NOT DETECTED Final   Parainfluenza Virus 4 NOT DETECTED NOT DETECTED Final   Respiratory Syncytial Virus NOT DETECTED NOT DETECTED  Final   Bordetella pertussis NOT DETECTED NOT DETECTED Final   Bordetella Parapertussis NOT DETECTED NOT DETECTED Final   Chlamydophila pneumoniae NOT DETECTED NOT DETECTED Final   Mycoplasma pneumoniae NOT DETECTED NOT DETECTED Final    Comment: Performed at [facility], [place] [address], [place], [place] [address]  CSF culture w Gram Stain     Status: None   Collection Time: 07/30/21 12:30 PM   Specimen: CSF; Cerebrospinal Fluid  Result Value Ref Range Status   Specimen Description   Final    CSF Performed at  [facility], [place] [address] [place], [place] [address]    Special Requests   Final    NONE Performed at [facility], [place] [address] [place], [place] [address]    Gram Stain   Final    WBC PRESENT, PREDOMINANTLY MONONUCLEAR NO ORGANISMS SEEN CYTOSPIN SMEAR Gram Stain Report Called to,Read Back By and Verified With: B.CATES LAND, RN AT 7846 ON 10.05.22 BY N.THOMPSON Performed at [facility] [address], [place], [place] [address]    Culture   Final    NO GROWTH 3 DAYS Performed at [facility], [place] [address] [place], [place] [address]    Report Status 08/03/2021 FINAL  Final  Culture, fungus without smear     Status: None (Preliminary result)   Collection Time: 07/30/21 12:30 PM   Specimen: CSF; Cerebrospinal Fluid  Result Value Ref Range Status   Specimen Description   Final    CSF Performed at [place] [address], [place], [place] [address]    Special Requests   Final    NONE Performed at [facility], [place] [address], [place], [place] [address]    Culture   Final    NO FUNGUS ISOLATED AFTER 9 DAYS Performed at [facility], [place] [address] [place], [place] [address]    Report Status PENDING  Incomplete  SARS CORONAVIRUS 2 (TAT 6-24 HRS) Nasopharyngeal Nasopharyngeal Swab     Status: None   Collection Time: 08/05/21 12:13 PM    Specimen: Nasopharyngeal Swab  Result Value Ref Range Status   SARS Coronavirus 2 NEGATIVE NEGATIVE Final    Comment: (NOTE) SARS-CoV-2 target nucleic acids are NOT DETECTED.  The SARS-CoV-2 RNA is generally detectable in upper and lower respiratory specimens during the acute phase of infection. Negative results do not preclude SARS-CoV-2 infection, do not rule out co-infections with other pathogens, and should not be used as the sole basis for treatment or other patient management decisions. Negative results must be combined with clinical observations, patient history, and epidemiological information. The expected result is Negative.  Fact Sheet for Patients: SugarRoll.be  Fact Sheet for Healthcare Providers: https://www.woods-mathews.com/  This test is not yet approved or cleared by the [place] FDA and  has been authorized for detection and/or diagnosis of SARS-CoV-2 by FDA under an Emergency Use Authorization (EUA). This EUA will remain  in effect (meaning this test can be used) for the duration of the COVID-19 declaration under Se ction 564(b)(1) of the Act, 21 U.S.C. section 360bbb-3(b)(1), unless the authorization is terminated or revoked sooner.  Performed at [facility], [place] [address], [place], [place] [address]      Labs: BNP (last 3 results) Recent Labs    07/31/21 0527  BNP 259.5*   Basic Metabolic Panel: Recent Labs  Lab 08/02/21 0556 08/04/21 0528 08/05/21 1108  NA 146* 137 138  K 3.8 3.9 4.0  CL 112* 108 107  CO2 24 22 25   GLUCOSE 113* 121* 132*  BUN 18 22 22   CREATININE 1.10* 1.26* 1.03*  CALCIUM 9.0 8.5* 8.9   Liver Function Tests: No results for input(s): AST, ALT, ALKPHOS, BILITOT, PROT, ALBUMIN in the last 168 hours. No results for input(s): LIPASE, AMYLASE in the last 168 hours. No results for input(s): AMMONIA in the last 168 hours. CBC: Recent Labs  Lab 08/02/21 0556  08/04/21 0528 08/06/21 0517  WBC 12.9* 12.7* 15.7*  NEUTROABS 9.9*  --  12.8*  HGB 9.4* 8.4* 9.0*  HCT 29.9* 26.4* 28.3*  MCV 100.3* 101.1* 99.3  PLT 200 210 235   Cardiac Enzymes: No results for input(s): CKTOTAL, CKMB, CKMBINDEX, TROPONINI in the last 168 hours. BNP: Invalid input(s): POCBNP CBG: No results for input(s): GLUCAP in the last 168 hours. D-Dimer No results for input(s): DDIMER in the last 72 hours. Hgb A1c No results for input(s): HGBA1C in the last 72 hours. Lipid Profile No results for input(s): CHOL, HDL, LDLCALC, TRIG, CHOLHDL, LDLDIRECT in the last 72 hours. Thyroid function studies No results for input(s): TSH, T4TOTAL, T3FREE, THYROIDAB in the last 72 hours.  Invalid input(s): FREET3 Anemia work up No results for input(s): VITAMINB12, FOLATE, FERRITIN, TIBC, IRON, RETICCTPCT in the last 72 hours. Urinalysis    Component Value Date/Time   COLORURINE YELLOW 07/27/2021 2053   APPEARANCEUR CLEAR 07/27/2021 2053   LABSPEC 1.009 07/27/2021 2053   PHURINE 6.0 07/27/2021 2053   GLUCOSEU NEGATIVE 07/27/2021 2053   GLUCOSEU NEGATIVE 08/01/2020 1620   HGBUR MODERATE (A) 07/27/2021 2053   BILIRUBINUR NEGATIVE 07/27/2021 2053   KETONESUR 5 (A) 07/27/2021 2053   PROTEINUR NEGATIVE 07/27/2021 2053   UROBILINOGEN 0.2 08/01/2020 1620   NITRITE NEGATIVE 07/27/2021 2053   LEUKOCYTESUR NEGATIVE 07/27/2021 2053   Sepsis Labs Invalid input(s): PROCALCITONIN,  WBC,  LACTICIDVEN Microbiology Recent Results (from the past 240 hour(s))  Culture, blood (routine x 2)     Status: None   Collection Time: 07/29/21  3:37 PM   Specimen: BLOOD LEFT WRIST  Result Value Ref Range Status   Specimen Description   Final    BLOOD LEFT WRIST Performed at [facility], [place] [address] [place], [place] [address]    Special Requests   Final    BOTTLES DRAWN AEROBIC ONLY Blood Culture results may not be optimal due to an inadequate volume of blood received in  culture bottles Performed at [place] [address] [place], [place] [address]    Culture   Final    NO GROWTH 5 DAYS Performed at [facility], [place] [address] [place], [place] [address]    Report Status 08/03/2021 FINAL  Final  Culture, blood (routine x 2)     Status: None   Collection Time: 07/29/21  3:38 PM   Specimen: BLOOD  Result Value Ref Range Status   Specimen Description   Final    BLOOD LEFT ANTECUBITAL Performed at [place] [address] [place], [place] [address]    Special Requests   Final    BOTTLES DRAWN AEROBIC ONLY Blood Culture results may not be optimal due to an inadequate volume of blood received in culture bottles Performed at [place] [address], [place], [place] [address]    Culture   Final    NO GROWTH 5 DAYS Performed at [facility], [place] [address] [place], [place] [address]    Report Status 08/03/2021 FINAL  Final  Respiratory (~20 pathogens) panel by PCR     Status: None   Collection Time: 07/29/21  5:46 PM   Specimen: Nasopharyngeal Swab; Respiratory  Result Value Ref Range Status   Adenovirus NOT DETECTED NOT DETECTED Final   Coronavirus 229E NOT DETECTED NOT DETECTED Final    Comment: (NOTE) The Coronavirus on the Respiratory Panel, DOES NOT test for the novel  Coronavirus (2019 nCoV)    Coronavirus HKU1 NOT DETECTED NOT DETECTED Final   Coronavirus  NL63 NOT DETECTED NOT DETECTED Final   Coronavirus OC43 NOT DETECTED NOT DETECTED Final   Metapneumovirus NOT DETECTED NOT DETECTED Final   Rhinovirus / Enterovirus NOT DETECTED NOT DETECTED Final   Influenza A NOT DETECTED NOT DETECTED Final   Influenza B NOT DETECTED NOT DETECTED Final   Parainfluenza Virus 1 NOT DETECTED NOT DETECTED Final   Parainfluenza Virus 2 NOT DETECTED NOT DETECTED Final   Parainfluenza Virus 3 NOT DETECTED NOT DETECTED Final   Parainfluenza Virus 4 NOT DETECTED NOT DETECTED  Final   Respiratory Syncytial Virus NOT DETECTED NOT DETECTED Final   Bordetella pertussis NOT DETECTED NOT DETECTED Final   Bordetella Parapertussis NOT DETECTED NOT DETECTED Final   Chlamydophila pneumoniae NOT DETECTED NOT DETECTED Final   Mycoplasma pneumoniae NOT DETECTED NOT DETECTED Final    Comment: Performed at [facility], [place] [address] [place], [place] [address]  CSF culture w Gram Stain     Status: None   Collection Time: 07/30/21 12:30 PM   Specimen: CSF; Cerebrospinal Fluid  Result Value Ref Range Status   Specimen Description   Final    CSF Performed at [place] [address] [place], [place] [address]    Special Requests   Final    NONE Performed at [facility], [place] [address] [place], [place] [address]    Gram Stain   Final    WBC PRESENT, PREDOMINANTLY MONONUCLEAR NO ORGANISMS SEEN CYTOSPIN SMEAR Gram Stain Report Called to,Read Back By and Verified With: B.CATES LAND, RN AT 2263 ON 10.05.22 BY N.THOMPSON Performed at [place] [address] [place], [place] [address]    Culture   Final    NO GROWTH 3 DAYS Performed at [facility], [place] [address] [place], [place] [address]    Report Status 08/03/2021 FINAL  Final  Culture, fungus without smear     Status: None (Preliminary result)   Collection Time: 07/30/21 12:30 PM   Specimen: CSF; Cerebrospinal Fluid  Result Value Ref Range Status   Specimen Description   Final    CSF Performed at [place] [address], [place], [place] [address]    Special Requests   Final    NONE Performed at [facility], [place] [address], [place], [place] [address]    Culture   Final    NO FUNGUS ISOLATED AFTER 9 DAYS Performed at [facility], [place] [address] [place], [place] [address]    Report Status PENDING  Incomplete  SARS CORONAVIRUS 2 (TAT 6-24 HRS) Nasopharyngeal Nasopharyngeal Swab      Status: None   Collection Time: 08/05/21 12:13 PM   Specimen: Nasopharyngeal Swab  Result Value Ref Range Status   SARS Coronavirus 2 NEGATIVE NEGATIVE Final    Comment: (NOTE) SARS-CoV-2 target nucleic acids are NOT DETECTED.  The SARS-CoV-2 RNA is generally detectable in upper and lower respiratory specimens during the acute phase of infection. Negative results do not preclude SARS-CoV-2 infection, do not rule out co-infections with other pathogens, and should not be used as the sole basis for treatment or other patient management decisions. Negative results must be combined with clinical observations, patient history, and epidemiological information. The expected result is Negative.  Fact Sheet for Patients: SugarRoll.be  Fact Sheet for Healthcare Providers: https://www.woods-mathews.com/  This test is not yet approved or cleared by the [place] FDA and  has been authorized for detection and/or diagnosis  of SARS-CoV-2 by FDA under an Emergency Use Authorization (EUA). This EUA will remain  in effect (meaning this test can be used) for the duration of the COVID-19 declaration under Se ction 564(b)(1) of the Act, 21 U.S.C. section 360bbb-3(b)(1), unless the authorization is terminated or revoked sooner.  Performed at [facility], [place] [address] [place], [place] [address]      Time coordinating discharge: 35 minutes  SIGNED:   Cordelia Poche, MD [facility] 08/08/2021, 11:55 AM

## 2021-08-11 DIAGNOSIS — I509 Heart failure, unspecified: Secondary | ICD-10-CM | POA: Diagnosis not present

## 2021-08-11 DIAGNOSIS — N189 Chronic kidney disease, unspecified: Secondary | ICD-10-CM | POA: Diagnosis not present

## 2021-08-11 DIAGNOSIS — G894 Chronic pain syndrome: Secondary | ICD-10-CM | POA: Diagnosis not present

## 2021-08-11 DIAGNOSIS — I110 Hypertensive heart disease with heart failure: Secondary | ICD-10-CM | POA: Diagnosis not present

## 2021-08-18 DIAGNOSIS — N189 Chronic kidney disease, unspecified: Secondary | ICD-10-CM | POA: Diagnosis not present

## 2021-08-18 DIAGNOSIS — I130 Hypertensive heart and chronic kidney disease with heart failure and stage 1 through stage 4 chronic kidney disease, or unspecified chronic kidney disease: Secondary | ICD-10-CM | POA: Diagnosis not present

## 2021-08-18 DIAGNOSIS — G894 Chronic pain syndrome: Secondary | ICD-10-CM | POA: Diagnosis not present

## 2021-08-18 DIAGNOSIS — I509 Heart failure, unspecified: Secondary | ICD-10-CM | POA: Diagnosis not present

## 2021-08-19 ENCOUNTER — Ambulatory Visit: Payer: Medicare HMO | Admitting: Rheumatology

## 2021-08-19 DIAGNOSIS — R600 Localized edema: Secondary | ICD-10-CM | POA: Diagnosis not present

## 2021-08-19 DIAGNOSIS — N281 Cyst of kidney, acquired: Secondary | ICD-10-CM | POA: Diagnosis not present

## 2021-08-19 DIAGNOSIS — N1832 Chronic kidney disease, stage 3b: Secondary | ICD-10-CM | POA: Diagnosis not present

## 2021-08-19 DIAGNOSIS — E871 Hypo-osmolality and hyponatremia: Secondary | ICD-10-CM | POA: Diagnosis not present

## 2021-08-19 DIAGNOSIS — R7303 Prediabetes: Secondary | ICD-10-CM | POA: Diagnosis not present

## 2021-08-19 DIAGNOSIS — R809 Proteinuria, unspecified: Secondary | ICD-10-CM | POA: Diagnosis not present

## 2021-08-19 DIAGNOSIS — I129 Hypertensive chronic kidney disease with stage 1 through stage 4 chronic kidney disease, or unspecified chronic kidney disease: Secondary | ICD-10-CM | POA: Diagnosis not present

## 2021-08-19 DIAGNOSIS — R3129 Other microscopic hematuria: Secondary | ICD-10-CM | POA: Diagnosis not present

## 2021-08-20 ENCOUNTER — Other Ambulatory Visit: Payer: Self-pay | Admitting: Nephrology

## 2021-08-20 DIAGNOSIS — N1832 Chronic kidney disease, stage 3b: Secondary | ICD-10-CM

## 2021-08-20 LAB — CULTURE, FUNGUS WITHOUT SMEAR

## 2021-08-21 DIAGNOSIS — N189 Chronic kidney disease, unspecified: Secondary | ICD-10-CM | POA: Diagnosis not present

## 2021-08-21 DIAGNOSIS — R6889 Other general symptoms and signs: Secondary | ICD-10-CM | POA: Diagnosis not present

## 2021-08-21 DIAGNOSIS — I509 Heart failure, unspecified: Secondary | ICD-10-CM | POA: Diagnosis not present

## 2021-08-21 DIAGNOSIS — J45909 Unspecified asthma, uncomplicated: Secondary | ICD-10-CM | POA: Diagnosis not present

## 2021-08-21 DIAGNOSIS — I10 Essential (primary) hypertension: Secondary | ICD-10-CM | POA: Diagnosis not present

## 2021-08-25 DIAGNOSIS — H43813 Vitreous degeneration, bilateral: Secondary | ICD-10-CM | POA: Diagnosis not present

## 2021-08-25 DIAGNOSIS — H11823 Conjunctivochalasis, bilateral: Secondary | ICD-10-CM | POA: Diagnosis not present

## 2021-08-25 DIAGNOSIS — H04123 Dry eye syndrome of bilateral lacrimal glands: Secondary | ICD-10-CM | POA: Diagnosis not present

## 2021-08-25 DIAGNOSIS — H10413 Chronic giant papillary conjunctivitis, bilateral: Secondary | ICD-10-CM | POA: Diagnosis not present

## 2021-08-25 DIAGNOSIS — Z961 Presence of intraocular lens: Secondary | ICD-10-CM | POA: Diagnosis not present

## 2021-08-26 ENCOUNTER — Telehealth: Payer: Self-pay | Admitting: *Deleted

## 2021-08-26 NOTE — Telephone Encounter (Signed)
Labs received from:[facility]   Drawn on:08/19/2021  Reviewed by:Dr. Bo Merino   Labs drawn:Renal Functional Panel, Magnesium, Hgb, PR/CR Ratio   Results:Glucose 105   BUN 39   Creatinine 1.62   GFR 31   Potassium 3.4   CO2 33   Magnesium 1.4   Hgb 9.4

## 2021-09-04 ENCOUNTER — Telehealth: Payer: Self-pay | Admitting: Internal Medicine

## 2021-09-04 DIAGNOSIS — I10 Essential (primary) hypertension: Secondary | ICD-10-CM | POA: Diagnosis not present

## 2021-09-04 DIAGNOSIS — N189 Chronic kidney disease, unspecified: Secondary | ICD-10-CM | POA: Diagnosis not present

## 2021-09-04 DIAGNOSIS — I509 Heart failure, unspecified: Secondary | ICD-10-CM | POA: Diagnosis not present

## 2021-09-04 DIAGNOSIS — R5381 Other malaise: Secondary | ICD-10-CM | POA: Diagnosis not present

## 2021-09-04 DIAGNOSIS — J45909 Unspecified asthma, uncomplicated: Secondary | ICD-10-CM | POA: Diagnosis not present

## 2021-09-04 NOTE — Telephone Encounter (Signed)
Pt. [employer] on yesterday and was told she needs a PA. Pt. is requesting PA for a shower stool.    Callback #- [phone]

## 2021-09-05 NOTE — Telephone Encounter (Signed)
Left message for patient's daughter to call me back ?

## 2021-09-09 ENCOUNTER — Other Ambulatory Visit: Payer: Self-pay | Admitting: Rheumatology

## 2021-09-09 DIAGNOSIS — Z7952 Long term (current) use of systemic steroids: Secondary | ICD-10-CM | POA: Diagnosis not present

## 2021-09-09 DIAGNOSIS — N184 Chronic kidney disease, stage 4 (severe): Secondary | ICD-10-CM | POA: Diagnosis not present

## 2021-09-09 DIAGNOSIS — Z7982 Long term (current) use of aspirin: Secondary | ICD-10-CM | POA: Diagnosis not present

## 2021-09-09 DIAGNOSIS — I130 Hypertensive heart and chronic kidney disease with heart failure and stage 1 through stage 4 chronic kidney disease, or unspecified chronic kidney disease: Secondary | ICD-10-CM | POA: Diagnosis not present

## 2021-09-09 DIAGNOSIS — G894 Chronic pain syndrome: Secondary | ICD-10-CM | POA: Diagnosis not present

## 2021-09-09 DIAGNOSIS — M797 Fibromyalgia: Secondary | ICD-10-CM | POA: Diagnosis not present

## 2021-09-09 DIAGNOSIS — K219 Gastro-esophageal reflux disease without esophagitis: Secondary | ICD-10-CM | POA: Diagnosis not present

## 2021-09-09 DIAGNOSIS — M1990 Unspecified osteoarthritis, unspecified site: Secondary | ICD-10-CM | POA: Diagnosis not present

## 2021-09-09 DIAGNOSIS — I5032 Chronic diastolic (congestive) heart failure: Secondary | ICD-10-CM | POA: Diagnosis not present

## 2021-09-09 DIAGNOSIS — I509 Heart failure, unspecified: Secondary | ICD-10-CM | POA: Diagnosis not present

## 2021-09-09 DIAGNOSIS — C50419 Malignant neoplasm of upper-outer quadrant of unspecified female breast: Secondary | ICD-10-CM | POA: Diagnosis not present

## 2021-09-09 DIAGNOSIS — J45909 Unspecified asthma, uncomplicated: Secondary | ICD-10-CM | POA: Diagnosis not present

## 2021-09-10 NOTE — Telephone Encounter (Signed)
Next Visit: 11/04/2021   Last Visit: 07/01/2021   Last Fill: 07/01/2021 (30 day supply for 1 mg) 07/28/2021 (30 day supply 5 mg)   Dx: Chondrocalcinosis   Current  Dose per office note on 07/01/2021: prednisone 7 mg p.o. daily.  She does not want to taper prednisone and I will prefer to stay on the same dose.    Okay to refill Prednisone?

## 2021-09-11 DIAGNOSIS — G8929 Other chronic pain: Secondary | ICD-10-CM | POA: Diagnosis not present

## 2021-09-11 DIAGNOSIS — M797 Fibromyalgia: Secondary | ICD-10-CM | POA: Diagnosis not present

## 2021-09-11 DIAGNOSIS — K219 Gastro-esophageal reflux disease without esophagitis: Secondary | ICD-10-CM | POA: Diagnosis not present

## 2021-09-12 ENCOUNTER — Other Ambulatory Visit (INDEPENDENT_AMBULATORY_CARE_PROVIDER_SITE_OTHER): Payer: Medicare HMO

## 2021-09-12 ENCOUNTER — Other Ambulatory Visit: Payer: Self-pay

## 2021-09-12 ENCOUNTER — Encounter: Payer: Self-pay | Admitting: Internal Medicine

## 2021-09-12 ENCOUNTER — Ambulatory Visit (INDEPENDENT_AMBULATORY_CARE_PROVIDER_SITE_OTHER): Payer: Medicare HMO | Admitting: Internal Medicine

## 2021-09-12 VITALS — BP 124/60 | HR 97 | Ht 60.0 in | Wt 153.0 lb

## 2021-09-12 DIAGNOSIS — K219 Gastro-esophageal reflux disease without esophagitis: Secondary | ICD-10-CM | POA: Diagnosis not present

## 2021-09-12 DIAGNOSIS — N184 Chronic kidney disease, stage 4 (severe): Secondary | ICD-10-CM | POA: Diagnosis not present

## 2021-09-12 DIAGNOSIS — I5189 Other ill-defined heart diseases: Secondary | ICD-10-CM

## 2021-09-12 DIAGNOSIS — G8929 Other chronic pain: Secondary | ICD-10-CM

## 2021-09-12 DIAGNOSIS — D649 Anemia, unspecified: Secondary | ICD-10-CM | POA: Diagnosis not present

## 2021-09-12 DIAGNOSIS — M1990 Unspecified osteoarthritis, unspecified site: Secondary | ICD-10-CM | POA: Diagnosis not present

## 2021-09-12 DIAGNOSIS — R7302 Impaired glucose tolerance (oral): Secondary | ICD-10-CM

## 2021-09-12 DIAGNOSIS — M797 Fibromyalgia: Secondary | ICD-10-CM | POA: Diagnosis not present

## 2021-09-12 DIAGNOSIS — I5032 Chronic diastolic (congestive) heart failure: Secondary | ICD-10-CM | POA: Diagnosis not present

## 2021-09-12 DIAGNOSIS — M25572 Pain in left ankle and joints of left foot: Secondary | ICD-10-CM

## 2021-09-12 DIAGNOSIS — I509 Heart failure, unspecified: Secondary | ICD-10-CM | POA: Diagnosis not present

## 2021-09-12 DIAGNOSIS — J45909 Unspecified asthma, uncomplicated: Secondary | ICD-10-CM | POA: Diagnosis not present

## 2021-09-12 DIAGNOSIS — Z7982 Long term (current) use of aspirin: Secondary | ICD-10-CM | POA: Diagnosis not present

## 2021-09-12 DIAGNOSIS — C50419 Malignant neoplasm of upper-outer quadrant of unspecified female breast: Secondary | ICD-10-CM | POA: Diagnosis not present

## 2021-09-12 DIAGNOSIS — G894 Chronic pain syndrome: Secondary | ICD-10-CM | POA: Diagnosis not present

## 2021-09-12 DIAGNOSIS — Z7952 Long term (current) use of systemic steroids: Secondary | ICD-10-CM | POA: Diagnosis not present

## 2021-09-12 DIAGNOSIS — I130 Hypertensive heart and chronic kidney disease with heart failure and stage 1 through stage 4 chronic kidney disease, or unspecified chronic kidney disease: Secondary | ICD-10-CM | POA: Diagnosis not present

## 2021-09-12 LAB — BASIC METABOLIC PANEL
BUN: 42 mg/dL — ABNORMAL HIGH (ref 6–23)
CO2: 29 mEq/L (ref 19–32)
Calcium: 9.5 mg/dL (ref 8.4–10.5)
Chloride: 100 mEq/L (ref 96–112)
Creatinine, Ser: 1.51 mg/dL — ABNORMAL HIGH (ref 0.40–1.20)
GFR: 31.69 mL/min — ABNORMAL LOW (ref >60.00)
Glucose, Bld: 170 mg/dL — ABNORMAL HIGH (ref 70–99)
Potassium: 3.6 mEq/L (ref 3.5–5.1)
Sodium: 141 mEq/L (ref 135–145)

## 2021-09-12 LAB — CBC WITH DIFFERENTIAL/PLATELET
Basophils Absolute: 0.1 10*3/uL (ref 0.0–0.1)
Basophils Relative: 0.7 % (ref 0.0–3.0)
Eosinophils Absolute: 0.2 10*3/uL (ref 0.0–0.7)
Eosinophils Relative: 1.5 % (ref 0.0–5.0)
HCT: 34.2 % — ABNORMAL LOW (ref 36.0–46.0)
Hemoglobin: 11.2 g/dL — ABNORMAL LOW (ref 12.0–15.0)
Lymphocytes Relative: 11.9 % — ABNORMAL LOW (ref 12.0–46.0)
Lymphs Abs: 1.2 10*3/uL (ref 0.7–4.0)
MCHC: 32.9 g/dL (ref 30.0–36.0)
MCV: 98.3 fl (ref 78.0–100.0)
Monocytes Absolute: 0.5 10*3/uL (ref 0.1–1.0)
Monocytes Relative: 4.9 % (ref 3.0–12.0)
Neutro Abs: 8.2 10*3/uL — ABNORMAL HIGH (ref 1.4–7.7)
Neutrophils Relative %: 81 % — ABNORMAL HIGH (ref 43.0–77.0)
Platelets: 226 10*3/uL (ref 150.0–400.0)
RBC: 3.48 Mil/uL — ABNORMAL LOW (ref 3.87–5.11)
RDW: 15.7 % — ABNORMAL HIGH (ref 11.5–15.5)
WBC: 10.1 10*3/uL (ref 4.0–10.5)

## 2021-09-12 LAB — IBC PANEL
Iron: 52 ug/dL (ref 42–145)
Saturation Ratios: 14.5 % — ABNORMAL LOW (ref 20.0–50.0)
TIBC: 358.4 ug/dL (ref 250.0–450.0)
Transferrin: 256 mg/dL (ref 212.0–360.0)

## 2021-09-12 LAB — HEPATIC FUNCTION PANEL
ALT: 20 U/L (ref 0–35)
AST: 18 U/L (ref 0–37)
Albumin: 4 g/dL (ref 3.5–5.2)
Alkaline Phosphatase: 61 U/L (ref 39–117)
Bilirubin, Direct: 0.1 mg/dL (ref 0.0–0.3)
Total Bilirubin: 0.3 mg/dL (ref 0.2–1.2)
Total Protein: 7.1 g/dL (ref 6.0–8.3)

## 2021-09-12 LAB — HEMOGLOBIN A1C: Hgb A1c MFr Bld: 5.6 % (ref 4.6–6.5)

## 2021-09-12 NOTE — Progress Notes (Signed)
Patient ID: [name], female   DOB: 04/26/1936, 85 y.o.   MRN: [id number]        Chief Complaint: follow up recent fall, persistent left ankle pain, chf       HPI:  [name] is a 85 y.o. female here after a fall in the home oct 2, hospd at [facility] approx 2 wks, then rehab in [place] with PT/OT, then home again, with daughter now living with her as her in home aide and caregiver; also PT/OT restarted yesterday in the home and appears to be progressing well, walks with walker, no further falls.  Does have persistent mild worsening chronic left ankle deformity and pain wth DJD that has not been a significant problem before but now worsening.  Pt denies chest pain, increased sob or doe, wheezing, orthopnea, PND, increased LE swelling, palpitations, dizziness or syncope.   Pt denies polydipsia, polyuria, or new focal neuro s/s.   Pt denies fever, wt loss, night sweats, loss of appetite, or other constitutional symptoms      Wt Readings from Last 3 Encounters:  09/12/21 153 lb (69.4 kg)  07/27/21 155 lb (70.3 kg)  07/22/21 155 lb (70.3 kg)   BP Readings from Last 3 Encounters:  09/12/21 124/60  08/08/21 131/70  07/22/21 126/78         Past Medical History:  Diagnosis Date   Allergic rhinitis 10/30/2016   Anemia    Asthma    Breast cancer of upper-outer quadrant of left female breast ([place]) 11/08/2013   ER/PR+ Her2- Left IDC    Chronic renal insufficiency    Chronic rhinitis    Colon polyp    Diastolic dysfunction 5/40/0867   DJD (degenerative joint disease)    Dyspnea    Full dentures    GERD (gastroesophageal reflux disease)    Hearing loss    Hypertension    Hyponatremia    Impaired glucose tolerance 07/18/2014   Memory loss    Morbid obesity ([name])    Poor circulation    Vertigo    Wears glasses    Past Surgical History:  Procedure Laterality Date   ABDOMINAL HYSTERECTOMY     BREAST LUMPECTOMY WITH NEEDLE LOCALIZATION AND AXILLARY SENTINEL LYMPH NODE BX Left  12/04/2013   Procedure: BREAST LUMPECTOMY WITH NEEDLE LOCALIZATION AND AXILLARY SENTINEL LYMPH NODE BX;  Surgeon: Shann Medal, MD;  Location: [facility];  Service: General;  Laterality: Left;   CATARACT EXTRACTION  2009   rt   COLONOSCOPY     EYE SURGERY Bilateral    cataract surgery   KNEE ARTHROSCOPY     both   LUMBAR LAMINECTOMY/DECOMPRESSION MICRODISCECTOMY Left 12/23/2017   Procedure: Left Lumbar One-Two Laminectomy with microdiscectomy;  Surgeon: Eustace Moore, MD;  Location: [facility];  Service: Neurosurgery;  Laterality: Left;  Left L1-2 Laminectomy with microdiscectomy   TONSILLECTOMY     TOTAL KNEE ARTHROPLASTY  2002   rt   TOTAL KNEE ARTHROPLASTY  2003   left   VESICOVAGINAL FISTULA CLOSURE W/ TAH  1980    reports that she has never smoked. She has never used smokeless tobacco. She reports that she does not drink alcohol and does not use drugs. family history includes Colon cancer in her mother; Healthy in her daughter; Heart attack (age of onset: 36) in her son; Lung cancer in her brother; Stomach cancer in her mother. Allergies  Allergen Reactions   Hydrocodone Itching   Lasix [Furosemide] Other (See Comments)  Dizziness.    Tizanidine Other (See Comments)    Dizzy and fall   Iron Hives and Other (See Comments)     bad constipation    Current Outpatient Medications on File Prior to Visit  Medication Sig Dispense Refill   albuterol (VENTOLIN HFA) 108 (90 Base) MCG/ACT inhaler Inhale 1 puff into the lungs every 6 (six) hours as needed for wheezing or shortness of breath.     amLODipine (NORVASC) 5 MG tablet TAKE 1 TABLET BY MOUTH EVERY DAY (Patient taking differently: Take 5 mg by mouth every morning.) 90 tablet 3   aspirin EC 81 MG tablet Take 81 mg by mouth every morning. Swallow whole.     Cholecalciferol (VITAMIN D3) 20 MCG (800 UNIT) TABS Take 1 tablet by mouth daily.     diclofenac Sodium (VOLTAREN) 1 % GEL APPLY 2 GRAMS TO AFFECTED AREA 4 TIMES A  DAY (Patient taking differently: Apply 1 application topically 4 (four) times daily as needed (pain).) 100 g 5   Ensure (ENSURE) Take 1 Can by mouth 3 (three) times daily between meals. (Patient taking differently: Take 237 mLs by mouth See admin instructions. Drink one can (237 mls) by mouth once or twice daily) 237 mL 12   fluticasone-salmeterol (ADVAIR DISKUS) 250-50 MCG/ACT AEPB Inhale 1 puff into the lungs in the morning and at bedtime. 180 each 3   furosemide (LASIX) 40 MG tablet Take 1 tablet (40 mg total) by mouth every Monday, Wednesday, and Friday.     gabapentin (NEURONTIN) 300 MG capsule TAKE 1 CAPSULE BY MOUTH THREE TIMES A DAY (Patient taking differently: Take 300 mg by mouth 3 (three) times daily.) 90 capsule 5   Incontinence Supply Disposable (DEPEND UNDERWEAR SM/MED) MISC Use as directed four times per day 120 each 5   Magnesium Gluconate 500 (27 Mg) MG TABS Take 500 mg by mouth 2 (two) times daily.     meclizine (ANTIVERT) 12.5 MG tablet TAKE 1 TABLET BY MOUTH THREE TIMES A DAY AS NEEDED FOR DIZZINESS (Patient taking differently: Take 12.5 mg by mouth 3 (three) times daily as needed for dizziness.) 90 tablet 2   montelukast (SINGULAIR) 10 MG tablet Take 10 mg by mouth at bedtime.     Multiple Vitamin (MULTIVITAMIN WITH MINERALS) TABS tablet Take 1 tablet by mouth daily. Centrum Silver     pantoprazole (PROTONIX) 40 MG tablet Take 40 mg by mouth daily.     potassium chloride (KLOR-CON) 10 MEQ tablet TAKE 1 TABLET BY MOUTH EVERY DAY WHEN TAKING FUROSEMIDE (Patient taking differently: Take 10 mEq by mouth every Monday, Wednesday, and Friday.) 90 tablet 3   predniSONE (DELTASONE) 1 MG tablet TAKE 2 TABLETS BY MOUTH EVERY DAY 60 tablet 0   predniSONE (DELTASONE) 5 MG tablet TAKE 1 TABLET BY MOUTH EVERY DAY WITH BREAKFAST 30 tablet 0   triamcinolone cream (KENALOG) 0.1 % APPLY TO AFFECTED AREA TWICE A DAY (Patient taking differently: Apply 1 application topically 2 (two) times daily as  needed (rash/irritation).) 30 g 1   No current facility-administered medications on file prior to visit.        ROS:  All others reviewed and negative.  Objective        PE:  BP 124/60 (BP Location: Right Arm, Patient Position: Sitting, Cuff Size: Normal)   Pulse 97   Ht 5' (1.524 m)   Wt 153 lb (69.4 kg)   SpO2 98%   BMI 29.88 kg/m  Constitutional: Pt appears in NAD               HENT: Head: NCAT.                Right Ear: External ear normal.                 Left Ear: External ear normal.                Eyes: . Pupils are equal, round, and reactive to light. Conjunctivae and EOM are normal               Nose: without d/c or deformity               Neck: Neck supple. Gross normal ROM               Cardiovascular: Normal rate and regular rhythm.                 Pulmonary/Chest: Effort normal and breath sounds without rales or wheezing.                Abd:  Soft, NT, ND, + BS, no organomegaly               Neurological: Pt is alert. At baseline orientation, motor grossly intact               Skin: Skin is warm. No rashes, no other new lesions, LE edema - none               Left ankle with mild effusion and bony deformity c/w degenerative change               Psychiatric: Pt behavior is normal without agitation   Micro: none  Cardiac tracings I have personally interpreted today:  none  Pertinent Radiological findings (summarize): none   Lab Results  Component Value Date   WBC 15.7 (H) 08/06/2021   HGB 9.0 (L) 08/06/2021   HCT 28.3 (L) 08/06/2021   PLT 235 08/06/2021   GLUCOSE 132 (H) 08/05/2021   CHOL 219 (H) 01/24/2021   TRIG 134 01/24/2021   HDL 83 01/24/2021   LDLDIRECT 109.0 08/01/2020   LDLCALC 111 (H) 01/24/2021   ALT 34 07/31/2021   AST 35 07/31/2021   NA 138 08/05/2021   K 4.0 08/05/2021   CL 107 08/05/2021   CREATININE 1.03 (H) 08/05/2021   BUN 22 08/05/2021   CO2 25 08/05/2021   TSH 1.404 07/28/2021   INR 1.0 07/27/2021   HGBA1C 6.1  06/18/2021   Assessment/Plan:  Althia TANAYIA WAHLQUIST is a 85 y.o. Black or African American [2] female with  has a past medical history of Allergic rhinitis (10/30/2016), Anemia, Asthma, Breast cancer of upper-outer quadrant of left female breast ([place]) (11/08/2013), Chronic renal insufficiency, Chronic rhinitis, Colon polyp, Diastolic dysfunction (5/85/2778), DJD (degenerative joint disease), Dyspnea, Full dentures, GERD (gastroesophageal reflux disease), Hearing loss, Hypertension, Hyponatremia, Impaired glucose tolerance (07/18/2014), Memory loss, Morbid obesity ([place]), Poor circulation, Vertigo, and Wears glasses.  CKD (chronic kidney disease) stage 4, GFR 15-29 ml/min (HCC) Lab Results  Component Value Date   CREATININE 1.03 (H) 08/05/2021   Stable overall, cont to avoid nephrotoxins   Anemia Lab Results  Component Value Date   WBC 15.7 (H) 08/06/2021   HGB 9.0 (L) 08/06/2021   HCT 28.3 (L) 08/06/2021   MCV 99.3 08/06/2021   PLT 235 08/06/2021  no overt bleeding, mild worsening recently, now for f/u  lab and iron studies  Impaired glucose tolerance Lab Results  Component Value Date   HGBA1C 6.1 06/18/2021   Stable, pt to continue current medical treatment  - diet   Diastolic dysfunction Mild with stable volume today, cont current med tx,  to f/u any worsening symptoms or concerns  Left ankle pain With worsening pain and possible unstable, for referral Dr Hewitt/ortho  Followup: Return in about 3 months (around 12/13/2021).  Cathlean Cower, MD 09/13/2021 5:14 AM [facility]

## 2021-09-12 NOTE — Patient Instructions (Signed)
Please continue all other medications as before, and refills have been done if requested.  Please have the pharmacy call with any other refills you may need.  Please continue your efforts at being more active, low cholesterol diet, and weight control..  Please keep your appointments with your specialists as you may have planned  Please make an Appointment to return in 3 months, or sooner if needed 

## 2021-09-13 ENCOUNTER — Other Ambulatory Visit: Payer: Self-pay | Admitting: Internal Medicine

## 2021-09-13 ENCOUNTER — Encounter: Payer: Self-pay | Admitting: Internal Medicine

## 2021-09-13 NOTE — Assessment & Plan Note (Addendum)
Lab Results  Component Value Date   WBC 15.7 (H) 08/06/2021   HGB 9.0 (L) 08/06/2021   HCT 28.3 (L) 08/06/2021   MCV 99.3 08/06/2021   PLT 235 08/06/2021  no overt bleeding, mild worsening recently, now for f/u lab and iron studies

## 2021-09-13 NOTE — Assessment & Plan Note (Signed)
Lab Results  Component Value Date   CREATININE 1.03 (H) 08/05/2021   Stable overall, cont to avoid nephrotoxins

## 2021-09-13 NOTE — Assessment & Plan Note (Signed)
With worsening pain and possible unstable, for referral Dr Hewitt/ortho

## 2021-09-13 NOTE — Assessment & Plan Note (Signed)
Lab Results  Component Value Date   HGBA1C 6.1 06/18/2021   Stable, pt to continue current medical treatment  - diet

## 2021-09-13 NOTE — Assessment & Plan Note (Signed)
Mild with stable volume today, cont current med tx,  to f/u any worsening symptoms or concerns

## 2021-09-13 NOTE — Telephone Encounter (Signed)
Please refill as per office routine med refill policy (all routine meds to be refilled for 3 mo or monthly (per pt preference) up to one year from last visit, then month to month grace period for 3 mo, then further med refills will have to be denied) ? ?

## 2021-09-15 ENCOUNTER — Encounter: Payer: Self-pay | Admitting: Internal Medicine

## 2021-09-15 LAB — TSH: TSH: 0.91 u[IU]/mL (ref 0.35–5.50)

## 2021-09-15 LAB — FERRITIN: Ferritin: 49.8 ng/mL (ref 10.0–291.0)

## 2021-09-15 LAB — VITAMIN B12: Vitamin B-12: 1452 pg/mL — ABNORMAL HIGH (ref 211–911)

## 2021-09-16 DIAGNOSIS — J45909 Unspecified asthma, uncomplicated: Secondary | ICD-10-CM | POA: Diagnosis not present

## 2021-09-16 DIAGNOSIS — I509 Heart failure, unspecified: Secondary | ICD-10-CM | POA: Diagnosis not present

## 2021-09-16 DIAGNOSIS — I5032 Chronic diastolic (congestive) heart failure: Secondary | ICD-10-CM | POA: Diagnosis not present

## 2021-09-16 DIAGNOSIS — N184 Chronic kidney disease, stage 4 (severe): Secondary | ICD-10-CM | POA: Diagnosis not present

## 2021-09-16 DIAGNOSIS — K219 Gastro-esophageal reflux disease without esophagitis: Secondary | ICD-10-CM | POA: Diagnosis not present

## 2021-09-16 DIAGNOSIS — Z7982 Long term (current) use of aspirin: Secondary | ICD-10-CM | POA: Diagnosis not present

## 2021-09-16 DIAGNOSIS — M797 Fibromyalgia: Secondary | ICD-10-CM | POA: Diagnosis not present

## 2021-09-16 DIAGNOSIS — C50419 Malignant neoplasm of upper-outer quadrant of unspecified female breast: Secondary | ICD-10-CM | POA: Diagnosis not present

## 2021-09-16 DIAGNOSIS — M1990 Unspecified osteoarthritis, unspecified site: Secondary | ICD-10-CM | POA: Diagnosis not present

## 2021-09-16 DIAGNOSIS — I130 Hypertensive heart and chronic kidney disease with heart failure and stage 1 through stage 4 chronic kidney disease, or unspecified chronic kidney disease: Secondary | ICD-10-CM | POA: Diagnosis not present

## 2021-09-16 DIAGNOSIS — G894 Chronic pain syndrome: Secondary | ICD-10-CM | POA: Diagnosis not present

## 2021-09-16 DIAGNOSIS — Z7952 Long term (current) use of systemic steroids: Secondary | ICD-10-CM | POA: Diagnosis not present

## 2021-09-17 ENCOUNTER — Telehealth: Payer: Self-pay | Admitting: Internal Medicine

## 2021-09-17 DIAGNOSIS — I5032 Chronic diastolic (congestive) heart failure: Secondary | ICD-10-CM

## 2021-09-17 NOTE — Telephone Encounter (Signed)
Advised patient that Dr. Jenny Reichmann is out of the office 09/17/21. Will route to provider for when he returns to the office 09/22/21

## 2021-09-17 NOTE — Telephone Encounter (Signed)
Patient calling in  Patient says she is in need of a in home hospital bed.. would like order to be placed by provider  Says she does not know of any medical supply companies personally so provider can select one  Please let patient know when this has been done [phone]

## 2021-09-19 ENCOUNTER — Other Ambulatory Visit: Payer: Self-pay | Admitting: Internal Medicine

## 2021-09-19 ENCOUNTER — Other Ambulatory Visit: Payer: Self-pay | Admitting: Physician Assistant

## 2021-09-19 NOTE — Telephone Encounter (Signed)
Please refill as per office routine med refill policy (all routine meds to be refilled for 3 mo or monthly (per pt preference) up to one year from last visit, then month to month grace period for 3 mo, then further med refills will have to be denied) ? ?

## 2021-09-21 NOTE — Telephone Encounter (Signed)
Ok hardcopy order done to [facility]  Pt can go to [facility] on [address], or another if she likes, or fax hardcopy rx to a [place] agency if she is working with one now, thanks

## 2021-09-22 DIAGNOSIS — I130 Hypertensive heart and chronic kidney disease with heart failure and stage 1 through stage 4 chronic kidney disease, or unspecified chronic kidney disease: Secondary | ICD-10-CM | POA: Diagnosis not present

## 2021-09-22 DIAGNOSIS — C50419 Malignant neoplasm of upper-outer quadrant of unspecified female breast: Secondary | ICD-10-CM | POA: Diagnosis not present

## 2021-09-22 DIAGNOSIS — M1990 Unspecified osteoarthritis, unspecified site: Secondary | ICD-10-CM | POA: Diagnosis not present

## 2021-09-22 DIAGNOSIS — K219 Gastro-esophageal reflux disease without esophagitis: Secondary | ICD-10-CM | POA: Diagnosis not present

## 2021-09-22 DIAGNOSIS — G894 Chronic pain syndrome: Secondary | ICD-10-CM | POA: Diagnosis not present

## 2021-09-22 DIAGNOSIS — N184 Chronic kidney disease, stage 4 (severe): Secondary | ICD-10-CM | POA: Diagnosis not present

## 2021-09-22 DIAGNOSIS — I509 Heart failure, unspecified: Secondary | ICD-10-CM | POA: Diagnosis not present

## 2021-09-22 DIAGNOSIS — J45909 Unspecified asthma, uncomplicated: Secondary | ICD-10-CM | POA: Diagnosis not present

## 2021-09-22 DIAGNOSIS — Z7982 Long term (current) use of aspirin: Secondary | ICD-10-CM | POA: Diagnosis not present

## 2021-09-22 DIAGNOSIS — Z7952 Long term (current) use of systemic steroids: Secondary | ICD-10-CM | POA: Diagnosis not present

## 2021-09-22 DIAGNOSIS — M797 Fibromyalgia: Secondary | ICD-10-CM | POA: Diagnosis not present

## 2021-09-22 DIAGNOSIS — I5032 Chronic diastolic (congestive) heart failure: Secondary | ICD-10-CM | POA: Diagnosis not present

## 2021-09-22 NOTE — Telephone Encounter (Signed)
Notified patient via voicemail. 

## 2021-09-23 NOTE — Telephone Encounter (Signed)
Office visit notes have been faxed

## 2021-09-23 NOTE — Telephone Encounter (Signed)
[name] w/ [facility] requesting most recent ov note faxed for hospital bed  Fax [phone] attn: [name]

## 2021-09-24 DIAGNOSIS — Z7982 Long term (current) use of aspirin: Secondary | ICD-10-CM | POA: Diagnosis not present

## 2021-09-24 DIAGNOSIS — M1990 Unspecified osteoarthritis, unspecified site: Secondary | ICD-10-CM | POA: Diagnosis not present

## 2021-09-24 DIAGNOSIS — Z7952 Long term (current) use of systemic steroids: Secondary | ICD-10-CM | POA: Diagnosis not present

## 2021-09-24 DIAGNOSIS — I130 Hypertensive heart and chronic kidney disease with heart failure and stage 1 through stage 4 chronic kidney disease, or unspecified chronic kidney disease: Secondary | ICD-10-CM | POA: Diagnosis not present

## 2021-09-24 DIAGNOSIS — N184 Chronic kidney disease, stage 4 (severe): Secondary | ICD-10-CM | POA: Diagnosis not present

## 2021-09-24 DIAGNOSIS — I509 Heart failure, unspecified: Secondary | ICD-10-CM | POA: Diagnosis not present

## 2021-09-24 DIAGNOSIS — C50419 Malignant neoplasm of upper-outer quadrant of unspecified female breast: Secondary | ICD-10-CM | POA: Diagnosis not present

## 2021-09-24 DIAGNOSIS — G894 Chronic pain syndrome: Secondary | ICD-10-CM | POA: Diagnosis not present

## 2021-09-24 DIAGNOSIS — J45909 Unspecified asthma, uncomplicated: Secondary | ICD-10-CM | POA: Diagnosis not present

## 2021-09-24 DIAGNOSIS — K219 Gastro-esophageal reflux disease without esophagitis: Secondary | ICD-10-CM | POA: Diagnosis not present

## 2021-09-24 DIAGNOSIS — I5032 Chronic diastolic (congestive) heart failure: Secondary | ICD-10-CM | POA: Diagnosis not present

## 2021-09-24 DIAGNOSIS — M797 Fibromyalgia: Secondary | ICD-10-CM | POA: Diagnosis not present

## 2021-09-25 ENCOUNTER — Telehealth: Payer: Self-pay | Admitting: Internal Medicine

## 2021-09-25 DIAGNOSIS — M79672 Pain in left foot: Secondary | ICD-10-CM | POA: Diagnosis not present

## 2021-09-25 DIAGNOSIS — M25572 Pain in left ankle and joints of left foot: Secondary | ICD-10-CM | POA: Diagnosis not present

## 2021-09-25 DIAGNOSIS — M7742 Metatarsalgia, left foot: Secondary | ICD-10-CM | POA: Diagnosis not present

## 2021-09-25 DIAGNOSIS — S86892A Other injury of other muscle(s) and tendon(s) at lower leg level, left leg, initial encounter: Secondary | ICD-10-CM | POA: Diagnosis not present

## 2021-09-25 DIAGNOSIS — I5022 Chronic systolic (congestive) heart failure: Secondary | ICD-10-CM | POA: Diagnosis not present

## 2021-09-25 NOTE — Telephone Encounter (Signed)
Home Health verbal orders-caller/Agency: [facility] number: [phone]  Requesting OT/PT/Skilled nursing/Social Work/Speech: PT  Frequency: 1w6   LVM for therapist to call office back to confirm agency name

## 2021-09-26 ENCOUNTER — Other Ambulatory Visit: Payer: Self-pay | Admitting: Orthopaedic Surgery

## 2021-09-26 ENCOUNTER — Ambulatory Visit
Admission: RE | Admit: 2021-09-26 | Discharge: 2021-09-26 | Disposition: A | Discharge status: Home / Self Care | Payer: Medicare HMO | Source: Ambulatory Visit | Attending: Orthopaedic Surgery | Admitting: Orthopaedic Surgery

## 2021-09-26 DIAGNOSIS — M2142 Flat foot [pes planus] (acquired), left foot: Secondary | ICD-10-CM | POA: Diagnosis not present

## 2021-09-26 DIAGNOSIS — M25572 Pain in left ankle and joints of left foot: Secondary | ICD-10-CM

## 2021-09-26 DIAGNOSIS — S9302XA Subluxation of left ankle joint, initial encounter: Secondary | ICD-10-CM | POA: Diagnosis not present

## 2021-09-26 DIAGNOSIS — M19072 Primary osteoarthritis, left ankle and foot: Secondary | ICD-10-CM | POA: Diagnosis not present

## 2021-09-26 DIAGNOSIS — Q666 Other congenital valgus deformities of feet: Secondary | ICD-10-CM | POA: Diagnosis not present

## 2021-10-02 DIAGNOSIS — R3129 Other microscopic hematuria: Secondary | ICD-10-CM | POA: Diagnosis not present

## 2021-10-02 DIAGNOSIS — I129 Hypertensive chronic kidney disease with stage 1 through stage 4 chronic kidney disease, or unspecified chronic kidney disease: Secondary | ICD-10-CM | POA: Diagnosis not present

## 2021-10-02 DIAGNOSIS — R7303 Prediabetes: Secondary | ICD-10-CM | POA: Diagnosis not present

## 2021-10-02 DIAGNOSIS — R809 Proteinuria, unspecified: Secondary | ICD-10-CM | POA: Diagnosis not present

## 2021-10-02 DIAGNOSIS — N1832 Chronic kidney disease, stage 3b: Secondary | ICD-10-CM | POA: Diagnosis not present

## 2021-10-02 DIAGNOSIS — R600 Localized edema: Secondary | ICD-10-CM | POA: Diagnosis not present

## 2021-10-02 DIAGNOSIS — N281 Cyst of kidney, acquired: Secondary | ICD-10-CM | POA: Diagnosis not present

## 2021-10-02 DIAGNOSIS — E871 Hypo-osmolality and hyponatremia: Secondary | ICD-10-CM | POA: Diagnosis not present

## 2021-10-03 ENCOUNTER — Other Ambulatory Visit: Payer: Self-pay

## 2021-10-03 ENCOUNTER — Ambulatory Visit
Admission: RE | Admit: 2021-10-03 | Discharge: 2021-10-03 | Disposition: A | Discharge status: Home / Self Care | Payer: Medicare HMO | Source: Ambulatory Visit | Attending: Nephrology | Admitting: Nephrology

## 2021-10-03 DIAGNOSIS — N1832 Chronic kidney disease, stage 3b: Secondary | ICD-10-CM | POA: Diagnosis not present

## 2021-10-03 DIAGNOSIS — N281 Cyst of kidney, acquired: Secondary | ICD-10-CM | POA: Diagnosis not present

## 2021-10-04 DIAGNOSIS — M79672 Pain in left foot: Secondary | ICD-10-CM | POA: Diagnosis not present

## 2021-10-04 DIAGNOSIS — M25572 Pain in left ankle and joints of left foot: Secondary | ICD-10-CM | POA: Diagnosis not present

## 2021-10-06 ENCOUNTER — Other Ambulatory Visit: Payer: Self-pay

## 2021-10-06 ENCOUNTER — Encounter: Payer: Self-pay | Admitting: Internal Medicine

## 2021-10-06 ENCOUNTER — Ambulatory Visit (INDEPENDENT_AMBULATORY_CARE_PROVIDER_SITE_OTHER): Payer: Medicare HMO | Admitting: Internal Medicine

## 2021-10-06 VITALS — BP 100/58 | HR 99 | Temp 97.8°F | Ht 60.0 in | Wt 153.0 lb

## 2021-10-06 DIAGNOSIS — N184 Chronic kidney disease, stage 4 (severe): Secondary | ICD-10-CM

## 2021-10-06 DIAGNOSIS — I5032 Chronic diastolic (congestive) heart failure: Secondary | ICD-10-CM

## 2021-10-06 DIAGNOSIS — I10 Essential (primary) hypertension: Secondary | ICD-10-CM

## 2021-10-06 DIAGNOSIS — M5416 Radiculopathy, lumbar region: Secondary | ICD-10-CM

## 2021-10-06 DIAGNOSIS — R7302 Impaired glucose tolerance (oral): Secondary | ICD-10-CM

## 2021-10-06 MED ORDER — HYDROCODONE-ACETAMINOPHEN 7.5-325 MG PO TABS
1.0000 | ORAL_TABLET | Freq: Four times a day (QID) | ORAL | 0 refills | Status: DC | PRN
Start: 2021-10-06 — End: 2021-10-15

## 2021-10-06 NOTE — Assessment & Plan Note (Signed)
Lab Results  Component Value Date   CREATININE 1.51 (H) 09/12/2021   Recent Stable overall, cont to avoid nephrotoxins, declines lab today

## 2021-10-06 NOTE — Assessment & Plan Note (Signed)
New onset recent worsening with severe pain, only relief or improvement is to lie flat; for MRI LS spine urgent, vicodin 7.5 qid prn, and refer NS Dr Ronnald Ramp

## 2021-10-06 NOTE — Assessment & Plan Note (Signed)
Lab Results  Component Value Date   HGBA1C 5.6 09/12/2021   Stable, pt to continue current medical treatment  - diet

## 2021-10-06 NOTE — Progress Notes (Signed)
Patient ID: [name], female   DOB: [birth year], 85 y.o.   MRN: [id number]        Chief Complaint: follow up new worsening left lower back to foot       HPI:  [name] is a 85 y.o. female here with hxof left ankle chronic pain with recent CT/MRI evaluation followed per ortho, as well as hx of LS spine disc dz s/p surgury 2019 per NS Dr Ronnald Ramp, now c/o 1 wk new worsening onset left lower back "tail" pain with radiation to the buttock then raidates to the distal LLE  and foot, and is different from her ankle pain.  Pain now 10/10, cant sit well and in fact leaves the exam for the car to go home to lie down as this makes better, asap before paperwork AVS given at this visit.  Pain is sharp and dull, with burning to the LLE and weakness, constant, nothing else makes better or worse.  No recent falls.  Pt has hx of 2019 LS spine disc dz and prior surgury per Dr Ronnald Ramp (neurosurgury) which helped until this past wk.  Pt denies chest pain, increased sob or doe, wheezing, orthopnea, PND, increased LE swelling, palpitations, dizziness or syncope.   Pt denies polydipsia, polyuria     also as an aside, pt feels overall less able to help herself at home, asking for me to ask [facility] for 35 hrs per wk assist in the home Wt Readings from Last 3 Encounters:  10/06/21 153 lb (69.4 kg)  09/12/21 153 lb (69.4 kg)  07/27/21 155 lb (70.3 kg)   BP Readings from Last 3 Encounters:  10/06/21 (!) 100/58  09/12/21 124/60  08/08/21 131/70         Past Medical History:  Diagnosis Date   Allergic rhinitis 10/30/2016   Anemia    Asthma    Breast cancer of upper-outer quadrant of left female breast ([place]) 11/08/2013   ER/PR+ Her2- Left IDC    Chronic renal insufficiency    Chronic rhinitis    Colon polyp    Diastolic dysfunction 4/92/0100   DJD (degenerative joint disease)    Dyspnea    Full dentures    GERD (gastroesophageal reflux disease)    Hearing loss    Hypertension    Hyponatremia     Impaired glucose tolerance 07/18/2014   Memory loss    Morbid obesity ([place])    Poor circulation    Vertigo    Wears glasses    Past Surgical History:  Procedure Laterality Date   ABDOMINAL HYSTERECTOMY     BREAST LUMPECTOMY WITH NEEDLE LOCALIZATION AND AXILLARY SENTINEL LYMPH NODE BX Left 12/04/2013   Procedure: BREAST LUMPECTOMY WITH NEEDLE LOCALIZATION AND AXILLARY SENTINEL LYMPH NODE BX;  Surgeon: Shann Medal, MD;  Location: [facility];  Service: General;  Laterality: Left;   CATARACT EXTRACTION  2009   rt   COLONOSCOPY     EYE SURGERY Bilateral    cataract surgery   KNEE ARTHROSCOPY     both   LUMBAR LAMINECTOMY/DECOMPRESSION MICRODISCECTOMY Left 12/23/2017   Procedure: Left Lumbar One-Two Laminectomy with microdiscectomy;  Surgeon: Eustace Moore, MD;  Location: [facility];  Service: Neurosurgery;  Laterality: Left;  Left L1-2 Laminectomy with microdiscectomy   TONSILLECTOMY     TOTAL KNEE ARTHROPLASTY  2002   rt   TOTAL KNEE ARTHROPLASTY  2003   left   VESICOVAGINAL FISTULA CLOSURE W/ TAH  1980  reports that she has never smoked. She has never used smokeless tobacco. She reports that she does not drink alcohol and does not use drugs. family history includes Colon cancer in her mother; Healthy in her daughter; Heart attack (age of onset: 27) in her son; Lung cancer in her brother; Stomach cancer in her mother. Allergies  Allergen Reactions   Hydrocodone Itching   Lasix [Furosemide] Other (See Comments)    Dizziness.    Tizanidine Other (See Comments)    Dizzy and fall   Iron Hives and Other (See Comments)     bad constipation    Current Outpatient Medications on File Prior to Visit  Medication Sig Dispense Refill   albuterol (VENTOLIN HFA) 108 (90 Base) MCG/ACT inhaler Inhale 1 puff into the lungs every 6 (six) hours as needed for wheezing or shortness of breath.     amLODipine (NORVASC) 5 MG tablet TAKE 1 TABLET BY MOUTH EVERY DAY (Patient taking  differently: Take 5 mg by mouth every morning.) 90 tablet 3   aspirin EC 81 MG tablet Take 81 mg by mouth every morning. Swallow whole.     Cholecalciferol (VITAMIN D3) 20 MCG (800 UNIT) TABS Take 1 tablet by mouth daily.     diclofenac Sodium (VOLTAREN) 1 % GEL APPLY 2 GRAMS TO AFFECTED AREA 4 TIMES A DAY (Patient taking differently: Apply 1 application topically 4 (four) times daily as needed (pain).) 100 g 5   Ensure (ENSURE) Take 1 Can by mouth 3 (three) times daily between meals. (Patient taking differently: Take 237 mLs by mouth See admin instructions. Drink one can (237 mls) by mouth once or twice daily) 237 mL 12   fluticasone-salmeterol (ADVAIR DISKUS) 250-50 MCG/ACT AEPB Inhale 1 puff into the lungs in the morning and at bedtime. 180 each 3   furosemide (LASIX) 40 MG tablet Take 1 tablet (40 mg total) by mouth every Monday, Wednesday, and Friday.     gabapentin (NEURONTIN) 300 MG capsule TAKE 1 CAPSULE BY MOUTH THREE TIMES A DAY (Patient taking differently: Take 300 mg by mouth 3 (three) times daily.) 90 capsule 5   Incontinence Supply Disposable (DEPEND UNDERWEAR SM/MED) MISC Use as directed four times per day 120 each 5   Magnesium Gluconate 500 (27 Mg) MG TABS Take 500 mg by mouth 2 (two) times daily.     meclizine (ANTIVERT) 12.5 MG tablet TAKE 1 TABLET BY MOUTH THREE TIMES A DAY AS NEEDED FOR DIZZINESS (Patient taking differently: Take 12.5 mg by mouth 3 (three) times daily as needed for dizziness.) 90 tablet 2   montelukast (SINGULAIR) 10 MG tablet TAKE 1 TABLET BY MOUTH EVERY DAY 90 tablet 1   Multiple Vitamin (MULTIVITAMIN WITH MINERALS) TABS tablet Take 1 tablet by mouth daily. Centrum Silver     pantoprazole (PROTONIX) 40 MG tablet TAKE 1 TABLET BY MOUTH EVERY DAY 90 tablet 3   potassium chloride (KLOR-CON) 10 MEQ tablet TAKE 1 TABLET BY MOUTH EVERY DAY WHEN TAKING FUROSEMIDE (Patient taking differently: Take 10 mEq by mouth every Monday, Wednesday, and Friday.) 90 tablet 3    predniSONE (DELTASONE) 5 MG tablet TAKE 1 TABLET BY MOUTH EVERY DAY WITH BREAKFAST 30 tablet 0   triamcinolone cream (KENALOG) 0.1 % APPLY TO AFFECTED AREA TWICE A DAY (Patient taking differently: Apply 1 application topically 2 (two) times daily as needed (rash/irritation).) 30 g 1   predniSONE (DELTASONE) 1 MG tablet TAKE 2 TABLETS BY MOUTH EVERY DAY (Patient not taking: Reported on 10/06/2021) 60  tablet 0   No current facility-administered medications on file prior to visit.        ROS:  All others reviewed and negative.  Objective        PE:  BP (!) 100/58 (BP Location: Right Arm, Patient Position: Sitting, Cuff Size: Large)   Pulse 99   Temp 97.8 F (36.6 C) (Oral)   Ht 5' (1.524 m)   Wt 153 lb (69.4 kg)   SpO2 96%   BMI 29.88 kg/m                 Constitutional: Pt appears in NAD               HENT: Head: NCAT.                Right Ear: External ear normal.                 Left Ear: External ear normal.                Eyes: . Pupils are equal, round, and reactive to light. Conjunctivae and EOM are normal               Nose: without d/c or deformity               Neck: Neck supple. Gross normal ROM               Cardiovascular: Normal rate and regular rhythm.                 Pulmonary/Chest: Effort normal and breath sounds without rales or wheezing.                Abd:  Soft, NT, ND, + BS, no organomegaly               Neurological: Pt is alert. At baseline orientation, motor with 3/5 LLE motor (new) and reduced sensation to LT throughout                Spine with tender low lumbar midline and left lumbar paravertebral without swelling or rash               Skin: Skin is warm. No rashes, no other new lesions, LE edema - none               Psychiatric: Pt behavior is normal without agitation   Micro: none  Cardiac tracings I have personally interpreted today:  none  Pertinent Radiological findings (summarize): none   Lab Results  Component Value Date   WBC 10.1  09/12/2021   HGB 11.2 (L) 09/12/2021   HCT 34.2 (L) 09/12/2021   PLT 226.0 09/12/2021   GLUCOSE 170 (H) 09/12/2021   CHOL 219 (H) 01/24/2021   TRIG 134 01/24/2021   HDL 83 01/24/2021   LDLDIRECT 109.0 08/01/2020   LDLCALC 111 (H) 01/24/2021   ALT 20 09/12/2021   AST 18 09/12/2021   NA 141 09/12/2021   K 3.6 09/12/2021   CL 100 09/12/2021   CREATININE 1.51 (H) 09/12/2021   BUN 42 (H) 09/12/2021   CO2 29 09/12/2021   TSH 0.91 09/12/2021   INR 1.0 07/27/2021   HGBA1C 5.6 09/12/2021   Assessment/Plan:  Jamilee TAWONNA ESQUER is a 85 y.o. Black or African American [2] female with  has a past medical history of Allergic rhinitis (10/30/2016), Anemia, Asthma, Breast cancer of upper-outer quadrant of left female breast ([place]) (11/08/2013), Chronic renal insufficiency, Chronic  rhinitis, Colon polyp, Diastolic dysfunction (3/53/6144), DJD (degenerative joint disease), Dyspnea, Full dentures, GERD (gastroesophageal reflux disease), Hearing loss, Hypertension, Hyponatremia, Impaired glucose tolerance (07/18/2014), Memory loss, Morbid obesity ([place]), Poor circulation, Vertigo, and Wears glasses.  No problem-specific Assessment & Plan notes found for this encounter.  Followup: No follow-ups on file.  Cathlean Cower, MD 10/06/2021 11:29 AM [facility]

## 2021-10-06 NOTE — Patient Instructions (Addendum)
I suspect you have worsening lower back disc problem new in the past week  You will be contacted regarding the referral for: MRI (urgent), and Dr Ronnald Ramp (neurosurgury)  Please take all new medication as prescribed- the pain medication - remember we can only give 20 pills to start, but if you need more, please call for refill  Please continue all other medications as before, and refills have been done if requested.  Please have the pharmacy call with any other refills you may need.  Please continue your efforts at being more active, low cholesterol diet, and weight control.  Please keep your appointments with your specialists as you may have planned - orthopedic for the ankle  We will try to ask [facility] to be there for 35 hrs per week

## 2021-10-06 NOTE — Assessment & Plan Note (Signed)
BP Readings from Last 3 Encounters:  10/06/21 (!) 100/58  09/12/21 124/60  08/08/21 131/70   Low normal today, pt to continue medical treatment norvasc 5

## 2021-10-11 ENCOUNTER — Ambulatory Visit
Admission: RE | Admit: 2021-10-11 | Discharge: 2021-10-11 | Disposition: A | Discharge status: Home / Self Care | Payer: Medicare HMO | Source: Ambulatory Visit | Attending: Internal Medicine | Admitting: Internal Medicine

## 2021-10-11 ENCOUNTER — Encounter: Payer: Self-pay | Admitting: Internal Medicine

## 2021-10-11 ENCOUNTER — Other Ambulatory Visit: Payer: Self-pay

## 2021-10-11 DIAGNOSIS — M48061 Spinal stenosis, lumbar region without neurogenic claudication: Secondary | ICD-10-CM | POA: Diagnosis not present

## 2021-10-11 DIAGNOSIS — M545 Low back pain, unspecified: Secondary | ICD-10-CM | POA: Diagnosis not present

## 2021-10-11 DIAGNOSIS — M5416 Radiculopathy, lumbar region: Secondary | ICD-10-CM

## 2021-10-11 DIAGNOSIS — M5136 Other intervertebral disc degeneration, lumbar region: Secondary | ICD-10-CM | POA: Diagnosis not present

## 2021-10-14 ENCOUNTER — Telehealth: Payer: Self-pay | Admitting: Internal Medicine

## 2021-10-14 NOTE — Telephone Encounter (Signed)
*  continue* from 09-25-2021 verbal auth  Name of [facility] Agency: [facility]

## 2021-10-14 NOTE — Telephone Encounter (Signed)
Patient calling in  Says she has finished taking the pain meds provider prescribed HYDROcodone-acetaminophen (NORCO) 7.5-325 MG tablet  .[name] wants to know if she is able to receive refill until Neurosurgery appt w/ provider Jones  Pharmacy  [id number] - [place], [place]. AT [address]  Phone:  [phone] Fax:  [phone]

## 2021-10-14 NOTE — Telephone Encounter (Signed)
Crystal w/ [facility] calling to check status on fax sent 09-24-2021 regarding discipline order for patient  Rep states she will resend fax today 10-14-2021

## 2021-10-15 ENCOUNTER — Telehealth: Payer: Self-pay

## 2021-10-15 MED ORDER — HYDROCODONE-ACETAMINOPHEN 7.5-325 MG PO TABS: 1.0000 | ORAL_TABLET | Freq: Four times a day (QID) | ORAL | 0 refills | Status: DC | PRN

## 2021-10-15 NOTE — Telephone Encounter (Signed)
Patient calling back in to check status of refill request  If provider not able to refill requested pain med would like provider to recommend OTC options  Please call [phone]

## 2021-10-15 NOTE — Telephone Encounter (Signed)
Decrease nursing to 1 every other week  Request speech therapy for conative function  Rory Percy [facility]

## 2021-10-15 NOTE — Telephone Encounter (Signed)
Verbals given  

## 2021-10-15 NOTE — Telephone Encounter (Signed)
Done erx 

## 2021-10-15 NOTE — Telephone Encounter (Signed)
Ok for verbals 

## 2021-10-17 NOTE — Telephone Encounter (Signed)
Forms refaxed

## 2021-10-21 NOTE — Progress Notes (Deleted)
Office Visit Note  Patient: [name]             Date of Birth: 04/26/1936           MRN: [id number]             PCP: Biagio Borg, MD Referring: Biagio Borg, MD Visit Date: 11/04/2021 Occupation: @GUAROCC @  Subjective:  No chief complaint on file.   History of Present Illness: [name] is a 85 y.o. female ***   Activities of Daily Living:  Patient reports morning stiffness for *** {minute/hour:19697}.   Patient {ACTIONS;DENIES/REPORTS:21021675::"Denies"} nocturnal pain.  Difficulty dressing/grooming: {ACTIONS;DENIES/REPORTS:21021675::"Denies"} Difficulty climbing stairs: {ACTIONS;DENIES/REPORTS:21021675::"Denies"} Difficulty getting out of chair: {ACTIONS;DENIES/REPORTS:21021675::"Denies"} Difficulty using hands for taps, buttons, cutlery, and/or writing: {ACTIONS;DENIES/REPORTS:21021675::"Denies"}  No Rheumatology ROS completed.   PMFS History:  Patient Active Problem List   Diagnosis Date Noted   Acute metabolic encephalopathy    Sepsis with encephalopathy without septic shock ([place])    AMS (altered mental status) 07/29/2021   Altered mental status 07/27/2021   Chronic diastolic CHF (congestive heart failure) ([place]) 07/27/2021   Right hip pain 06/27/2021   Right knee pain 21/30/8657   Acute diastolic congestive heart failure ([place]) 06/18/2021   Calcium pyrophosphate deposition disease 02/12/2021   Fibromyalgia 02/12/2021   Polymyalgia rheumatica ([place]) 02/12/2021   Inflammatory arthritis 02/12/2021   Left knee pain 02/12/2021   Long term (current) use of systemic steroids 02/01/2021   Nail disorder 01/24/2021   Toe pain, right 01/24/2021   Chondrocalcinosis 12/19/2020   Primary osteoarthritis of both hands 12/19/2020   Pain in joint of left shoulder 08/22/2020   Pain in joint of right shoulder 08/22/2020   Shoulder pain 07/31/2020   Chronic pain 04/23/2020   Supraclavicular fossa fullness 04/23/2020   Finding of  neck region 04/23/2020   Low grade fever 12/07/2019   OAB (overactive bladder) 12/07/2019   Pain of right heel 12/07/2019   Overactive bladder 12/07/2019   Diarrhea 04/13/2019   Abnormal gait 08/30/2018   Asthenia 08/30/2018   Conjunctivitis of left eye 04/13/2018   Rash 04/13/2018   Eruption at injection site 04/13/2018   Acute bilateral deep vein thrombosis (DVT) of femoral veins (HCC) 01/31/2018   Hypokalemia 01/31/2018   Electrocardiogram abnormal 01/31/2018   Increased creatine kinase level 01/31/2018   Left leg pain 01/13/2018   Left leg swelling 01/13/2018   Hearing loss 01/13/2018   Intractable pain 12/27/2017   Arachnoiditis 12/27/2017   Pain 12/27/2017   Hyponatremia    S/P lumbar laminectomy 12/23/2017   History of lumbar laminectomy 12/23/2017   Wheezing 12/10/2017   Itching 11/22/2017   Urinary frequency 11/22/2017   Increased frequency of urination 11/22/2017   Degeneration of lumbar intervertebral disc 11/05/2017   Sciatica 09/04/2017   Asthma exacerbation 08/31/2017   Acute asthma 08/31/2017   Flare of rheumatoid arthritis ([place]) 05/11/2017   Articular gout 05/01/2017   Vertigo 10/30/2016   Allergic rhinitis 10/30/2016   Incontinence of feces 07/31/2016   Hemorrhoids 07/31/2016   Hematochezia 07/31/2016   Peripheral venous insufficiency 84/69/6295   Diastolic dysfunction 28/41/3244   Peripheral edema 07/10/2016   Left ankle pain 06/02/2016   Neck pain 03/10/2016   Low back pain 03/10/2016   Right cervical radiculopathy 11/13/2015   Bradycardia 03/15/2015   Joint pain 12/18/2014   Fever 11/27/2014   Impaired glucose tolerance 07/18/2014   Right lumbar radiculopathy 07/18/2014   Left lumbar radiculopathy 07/18/2014   Encounter for well adult exam with  abnormal findings 02/20/2014   Malignant neoplasm of upper-outer quadrant of female breast ([place]) 11/08/2013   Diverticular disease 07/09/2012    Headache(784.0) 06/30/2012   Anemia 06/29/2012   Dyspnea 03/11/2012   Cough 03/11/2012   CKD (chronic kidney disease) stage 4, GFR 15-29 ml/min (HCC) 05/18/2011   Gastroesophageal reflux disease 07/05/2008   Vitamin D deficiency 12/19/2007   Morbid obesity ([place]) 12/16/2007   Essential hypertension 12/16/2007   DEGENERATIVE JOINT DISEASE 12/16/2007   Osteoarthritis 12/16/2007    Past Medical History:  Diagnosis Date   Allergic rhinitis 10/30/2016   Anemia    Asthma    Breast cancer of upper-outer quadrant of left female breast ([place]) 11/08/2013   ER/PR+ Her2- Left IDC    Chronic renal insufficiency    Chronic rhinitis    Colon polyp    Diastolic dysfunction 5/95/6387   DJD (degenerative joint disease)    Dyspnea    Full dentures    GERD (gastroesophageal reflux disease)    Hearing loss    Hypertension    Hyponatremia    Impaired glucose tolerance 07/18/2014   Memory loss    Morbid obesity ([place])    Poor circulation    Vertigo    Wears glasses     Family History  Problem Relation Age of Onset   Colon cancer Mother        in her 65's   Stomach cancer Mother    Lung cancer Brother        was a smoker   Heart attack Son 29   Healthy Daughter    Past Surgical History:  Procedure Laterality Date   ABDOMINAL HYSTERECTOMY     BREAST LUMPECTOMY WITH NEEDLE LOCALIZATION AND AXILLARY SENTINEL LYMPH NODE BX Left 12/04/2013   Procedure: BREAST LUMPECTOMY WITH NEEDLE LOCALIZATION AND AXILLARY SENTINEL LYMPH NODE BX;  Surgeon: Shann Medal, MD;  Location: [facility];  Service: General;  Laterality: Left;   CATARACT EXTRACTION  2009   rt   COLONOSCOPY     EYE SURGERY Bilateral    cataract surgery   KNEE ARTHROSCOPY     both   LUMBAR LAMINECTOMY/DECOMPRESSION MICRODISCECTOMY Left 12/23/2017   Procedure: Left Lumbar One-Two Laminectomy with microdiscectomy;  Surgeon: Eustace Moore, MD;  Location: [facility];  Service:  Neurosurgery;  Laterality: Left;  Left L1-2 Laminectomy with microdiscectomy   TONSILLECTOMY     TOTAL KNEE ARTHROPLASTY  2002   rt   TOTAL KNEE ARTHROPLASTY  2003   left   VESICOVAGINAL FISTULA CLOSURE W/ TAH  1980   Social History   Social History Narrative   Not on file   Immunization History  Administered Date(s) Administered   Fluad Quad(high Dose 65+) 11/27/2020, 06/27/2021   Influenza Split 08/07/2011, 10/13/2012   Influenza Whole 07/27/2008, 08/14/2009, 07/01/2010   Influenza, High Dose Seasonal PF 07/10/2016, 08/10/2017, 08/30/2018, 09/08/2019   Influenza,inj,Quad PF,6+ Mos 08/28/2013, 07/18/2014, 07/26/2015   PFIZER(Purple Top)SARS-COV-2 Vaccination 12/11/2019, 01/02/2020   Pneumococcal Conjugate-13 02/20/2014   Pneumococcal Polysaccharide-23 10/26/2005   Tdap 11/22/2017     Objective: Vital Signs: There were no vitals taken for this visit.   Physical Exam   Musculoskeletal Exam: ***  CDAI Exam: CDAI Score: -- Patient Global: --; Provider Global: -- Swollen: --; Tender: -- Joint Exam 11/04/2021   No joint exam has been documented for this visit   There is currently no information documented on the homunculus. Go to the Rheumatology activity and complete the homunculus joint exam.  Investigation:  No additional findings.  Imaging: MR Lumbar Spine Wo Contrast  Result Date: 10/11/2021 CLINICAL DATA:  Low back pain, symptoms persist with > 6 wks treatment EXAM: MRI LUMBAR SPINE WITHOUT CONTRAST TECHNIQUE: Multiplanar, multisequence MR imaging of the lumbar spine was performed. No intravenous contrast was administered. COMPARISON:  MRI lumbar spine 12/27/2017 FINDINGS: Segmentation:  Standard Alignment:  Unchanged levoconvex curvature. Vertebrae:  No fracture, evidence of discitis, or bone lesion. Conus medullaris and cauda equina: Conus extends to the L1 level. Conus and cauda equina appear unremarkable, no longer clustered. Paraspinal and other  soft tissues: Paraspinal muscle atrophy. Disc levels: T11-T12: Left paracentral disc protrusion. Mild left lateral recess narrowing and neural foraminal narrowing. T12-L1: Broad-based disc bulging, ligamentum flavum hypertrophy and bilateral facet arthropathy. Mild spinal canal stenosis, mild right and moderate left neural foraminal narrowing. L1-L2: Large central disc extrusion results in severe spinal canal stenosis. Severe right and mild left neural foraminal narrowing with contributory facet arthropathy bilaterally. L2-L3: Trace anterolisthesis with asymmetric right disc bulging and endplate spurring and bilateral facet arthropathy results in severe spinal canal stenosis, severe right and no significant left neural foraminal narrowing. L3-L4: Trace anterolisthesis with endplate spurring and asymmetric right disc bulging, bilateral facet arthropathy and ligamentum flavum thickening results in severe spinal canal stenosis, moderate left and severe right neural foraminal narrowing. L4-L5: Severe disc height loss with broad-based disc bulging, ligamentum flavum hypertrophy, and bilateral facet arthropathy results in mild spinal canal stenosis, severe left and no significant right neural foraminal narrowing. L5-S1: Severe disc height loss with broad-based disc bulging, ligamentum flavum hypertrophy and bilateral facet arthropathy results in mild spinal canal stenosis, severe right and moderate-severe left neural foraminal narrowing. IMPRESSION: Large central disc extrusion at L1-L2 results in severe spinal canal stenosis, severe right and mild left neural foraminal narrowing with contributory facet arthropathy bilaterally. Additional advanced degenerative changes of the lumbar spine with severe spinal canal stenosis and right-sided neural foraminal narrowing at L2-L3 and L3-L4. Moderate left neural foraminal narrowing at L3-L4. Severe left-sided neural foraminal narrowing and mild spinal canal stenosis at L4-L5.  Severe right and moderate-severe left neural foraminal narrowing at L5-S1 with mild spinal canal stenosis at this level. Electronically Signed   By: Maurine Simmering M.D.   On: 10/11/2021 14:16   US RENAL  Result Date: 10/04/2021 CLINICAL DATA:  Stage III B chronic kidney disease Follow-up complex cyst EXAM: RENAL / URINARY TRACT ULTRASOUND COMPLETE COMPARISON:  01/31/2021 renal ultrasound FINDINGS: Right Kidney: Renal measurements: 8.7 x 4.6 x 4.0 cm = volume: 83 mL. Mild diffuse thinning of the renal cortex with increased echogenicity. No hydronephrosis. Left Kidney: Renal measurements: 9.5 x 4.9 x 4.4 cm = volume: 107 mL. Mild diffuse thinning and increased echogenicity of the renal cortex. No hydronephrosis. 2.9 x 2.6 x 2.5 cm hypoechoic structure in the upper pole of the left kidney does not demonstrate significant interval growth. Asymmetric nodularity is noted within the lesion measuring approximately 6 mm in size. Bladder: Appears normal for degree of bladder distention. Other: None. IMPRESSION: 2.9 x 2.6 x 2.5 cm hypoechoic structure in the upper pole of the left kidney is similar in size to prior examination. Within this lesion, there is a 6 mm solid nodule which is better visualized on today's examination. Further evaluation with renal mass protocol CT or MRI should be performed to better characterize this nodularity. These results will be called to the ordering clinician or representative by the Radiologist Assistant, and communication documented in the PACS or [facility]. Electronically Signed   By: Miachel Roux M.D.   On: 10/04/2021 09:20   CT ANKLE LEFT WO CONTRAST  Result Date: 09/26/2021 CLINICAL DATA:  Left ankle pain, arthritis, swelling for 3 weeks. Patient reports falling a few months ago. EXAM: CT OF THE LEFT ANKLE WITHOUT CONTRAST TECHNIQUE: Multidetector CT imaging of the left ankle was performed according to the standard protocol. Multiplanar CT image reconstructions were also  generated. COMPARISON:  Left foot radiographs 08/03/2021 and 08/20/2020. FINDINGS: Bones/Joint/Cartilage Again demonstrated is diffuse osteopenia with a severe pes planus and mild hindfoot valgus deformity. There is narrowing of the sub fibular space with probable extra-articular lateral hindfoot impingement. The talar dome appears fragmented posterolaterally, best seen on the sagittal and coronal images. Probable associated nondisplaced osteochondral fragment measuring up to 1.3 cm on axial image 77/3 which does not appear acute. No definite acute fracture or dislocation. Mild midfoot degenerative changes are present. The alignment is normal at the Lisfranc joint. Ligaments Suboptimally assessed by CT. Muscles and Tendons Generalized muscular atrophy within the distal lower leg. The peroneal tendons are laterally subluxed from the retromalleolar groove although appear grossly intact. The posterior tibialis tendon appears markedly diminutive at the level of the medial malleolus, likely chronically torn. No significant peroneal tendon sheath fluid identified. Mild enthesophyte formation at the Achilles insertion on the calcaneus. Soft tissues Soft tissue thickening and calcification inferior to the lateral malleolus with narrowing of the sub fibular space consistent with extra-articular lateral hindfoot impingement. There is also some soft tissue thickening inferior medial to the talar head. No focal fluid collection, foreign body or soft tissue emphysema. Prominent vascular calcifications are noted. IMPRESSION: 1. Suspected unstable osteochondral lesion of the talar dome posterolaterally, likely due to nonacute injury. No evidence of acute fracture or dislocation. 2. Pes planus and hindfoot valgus deformities with findings of extra-articular lateral hindfoot impingement. The peroneal tendons appear laterally subluxed from the retromalleolar groove but intact. 3. Suspected chronic tear of the posterior tibialis  tendon. 4. Diffuse vascular calcifications. 5. Consider MRI for further evaluation. Electronically Signed   By: Richardean Sale M.D.   On: 09/26/2021 16:14    Recent Labs: Lab Results  Component Value Date   WBC 10.1 09/12/2021   HGB 11.2 (L) 09/12/2021   PLT 226.0 09/12/2021   NA 141 09/12/2021   K 3.6 09/12/2021   CL 100 09/12/2021   CO2 29 09/12/2021   GLUCOSE 170 (H) 09/12/2021   BUN 42 (H) 09/12/2021   CREATININE 1.51 (H) 09/12/2021   BILITOT 0.3 09/12/2021   ALKPHOS 61 09/12/2021   AST 18 09/12/2021   ALT 20 09/12/2021   PROT 7.1 09/12/2021   ALBUMIN 4.0 09/12/2021   CALCIUM 9.5 09/12/2021   GFRAA 25 (L) 12/25/2020    Speciality Comments: No specialty comments available.  Procedures:  No procedures performed Allergies: Hydrocodone, Lasix [furosemide], Tizanidine, and Iron   Assessment / Plan:     Visit Diagnoses: No diagnosis found.  Orders: No orders of the defined types were placed in this encounter.  No orders of the defined types were placed in this encounter.   Face-to-face time spent with patient was *** minutes. Greater than 50% of time was spent in counseling and coordination of care.  Follow-Up Instructions: No follow-ups on file.   Earnestine Mealing, CMA  Note - This record has been created using Editor, commissioning.  Chart creation errors have been sought, but may not always  have been located. Such creation errors do  not reflect on  the standard of medical care.

## 2021-10-23 ENCOUNTER — Telehealth: Payer: Self-pay | Admitting: Internal Medicine

## 2021-10-23 ENCOUNTER — Other Ambulatory Visit: Payer: Self-pay | Admitting: Physician Assistant

## 2021-10-23 DIAGNOSIS — R531 Weakness: Secondary | ICD-10-CM

## 2021-10-23 DIAGNOSIS — I5032 Chronic diastolic (congestive) heart failure: Secondary | ICD-10-CM

## 2021-10-23 DIAGNOSIS — N1831 Chronic kidney disease, stage 3a: Secondary | ICD-10-CM

## 2021-10-23 NOTE — Telephone Encounter (Signed)
Next Visit: 11/04/2021   Last Visit: 07/01/2021   Last Fill: 09/10/2021   Dx: Chondrocalcinosis   Current  Dose per office note on 07/01/2021: prednisone 7 mg p.o. daily.  She does not want to taper prednisone and I will prefer to stay on the same dose.    Okay to refill Prednisone?

## 2021-10-23 NOTE — Telephone Encounter (Signed)
Patient calling in  Patient is requesting a new mattress for her hospital bed  Says the new mattress is causing some back pain & is no longer comfortable  Please call [phone]

## 2021-10-26 DIAGNOSIS — I5022 Chronic systolic (congestive) heart failure: Secondary | ICD-10-CM | POA: Diagnosis not present

## 2021-10-28 NOTE — Telephone Encounter (Signed)
Done hardcopy to cma 

## 2021-10-28 NOTE — Telephone Encounter (Signed)
This was already started on dec 12  I see notes on the chart; Note   [name]  [name], [name]-- yes she is active [employer] -- they shoudl have a SW that can help get her more help at home-- this maybe something you need to ask [facility] if they are taking care of this for the patient.  Thanks.                   . Type Date User Summary Attachment  General 10/23/2021  8:13 AM [place], [place], [place] -  Note   [place], [place], [place] like patient is already active with [facility] therefore no other home health agency can help.  However, after reviewing if she is needing hours of services this would be Personal Care Service which would be thru [facility] in [place]. Let me know if you have any questions!               I dont know how to assist any further , thanks

## 2021-10-28 NOTE — Telephone Encounter (Signed)
Order has been faxed.  Patient notified and is requesting home health aide to assist with daily routines

## 2021-10-29 ENCOUNTER — Other Ambulatory Visit: Payer: Self-pay

## 2021-10-30 DIAGNOSIS — I10 Essential (primary) hypertension: Secondary | ICD-10-CM | POA: Diagnosis not present

## 2021-10-30 DIAGNOSIS — Z6826 Body mass index (BMI) 26.0-26.9, adult: Secondary | ICD-10-CM | POA: Diagnosis not present

## 2021-10-30 DIAGNOSIS — M48061 Spinal stenosis, lumbar region without neurogenic claudication: Secondary | ICD-10-CM | POA: Diagnosis not present

## 2021-10-30 DIAGNOSIS — M419 Scoliosis, unspecified: Secondary | ICD-10-CM | POA: Diagnosis not present

## 2021-10-30 DIAGNOSIS — M5416 Radiculopathy, lumbar region: Secondary | ICD-10-CM | POA: Diagnosis not present

## 2021-10-30 DIAGNOSIS — M5126 Other intervertebral disc displacement, lumbar region: Secondary | ICD-10-CM | POA: Diagnosis not present

## 2021-10-30 MED ORDER — MAGNESIUM GLUCONATE 500 (27 MG) MG PO TABS: 500.0000 mg | ORAL_TABLET | Freq: Two times a day (BID) | ORAL | 3 refills | Status: DC

## 2021-10-31 ENCOUNTER — Other Ambulatory Visit: Payer: Self-pay | Admitting: Physician Assistant

## 2021-10-31 ENCOUNTER — Telehealth: Payer: Self-pay | Admitting: Internal Medicine

## 2021-10-31 DIAGNOSIS — I5033 Acute on chronic diastolic (congestive) heart failure: Secondary | ICD-10-CM | POA: Diagnosis not present

## 2021-10-31 MED ORDER — HYDROCODONE-ACETAMINOPHEN 7.5-325 MG PO TABS: 1.0000 | ORAL_TABLET | Freq: Four times a day (QID) | ORAL | 0 refills | Status: DC | PRN

## 2021-10-31 NOTE — Telephone Encounter (Signed)
..  Caller name:Jull Guardiola  Caller callback [phone]  Encourage patient to contact the pharmacy for refills or they can request refills through [facility]  (Please schedule appointment if patient has not been seen in over a year)  MEDICATION NAME & DOSE:  Hydrocodone  Notes/Comments from patient::  Patient states that she is completely out of medication  WHAT PHARMACY WOULD THEY LIKE THIS SENT TO: [facility] [address]  Please notify patient: It takes 48-72 hours to process rx refill requests Ask patient to call pharmacy to ensure rx is ready before heading there.   (CLINICAL TO FILL OR ROUTE PER PROTOCOLS)

## 2021-10-31 NOTE — Telephone Encounter (Signed)
Patient notified

## 2021-10-31 NOTE — Telephone Encounter (Signed)
Done erx 

## 2021-11-04 ENCOUNTER — Telehealth: Payer: Self-pay | Admitting: Internal Medicine

## 2021-11-04 ENCOUNTER — Ambulatory Visit: Payer: Medicare HMO | Admitting: Physician Assistant

## 2021-11-04 NOTE — Telephone Encounter (Signed)
Home Health verbal orders-caller/Agency: Mickel Baas [place]/ [facility]  Callback number: [phone] (secure vm)  Requesting OT/PT/Skilled nursing/Social Work/Speech: PT  Frequency: 1w4 2w4 (every other week)

## 2021-11-04 NOTE — Telephone Encounter (Signed)
Ivin Booty from [employer] has called to state they are going to recertify pt for another 60 days for nursing and PT. Going to spine dr in a few weeks, and they will begin her with injections.    Callback #- [phone]

## 2021-11-04 NOTE — Telephone Encounter (Signed)
Ok verbals 

## 2021-11-05 NOTE — Telephone Encounter (Signed)
Verbals given  

## 2021-11-13 DIAGNOSIS — M5416 Radiculopathy, lumbar region: Secondary | ICD-10-CM | POA: Diagnosis not present

## 2021-11-17 ENCOUNTER — Other Ambulatory Visit: Payer: Self-pay | Admitting: Physician Assistant

## 2021-11-17 ENCOUNTER — Telehealth: Payer: Self-pay | Admitting: Internal Medicine

## 2021-11-17 NOTE — Telephone Encounter (Signed)
Please schedule patient for a follow up visit. Patient due January 2023. Thanks!

## 2021-11-17 NOTE — Telephone Encounter (Signed)
Next Visit: Due January 2023. Message sent to the front to schedule.    Last Visit: 07/01/2021   Last Fill: 09/10/2021   Dx: Chondrocalcinosis   Current  Dose per office note on 07/01/2021: prednisone 7 mg p.o. daily.  She does not want to taper prednisone and I will prefer to stay on the same dose.    Okay to refill Prednisone?

## 2021-11-17 NOTE — Telephone Encounter (Signed)
LMOM for patient to call and schedule follow-up appointment.   °

## 2021-11-17 NOTE — Telephone Encounter (Signed)
1.Medication Requested: HYDROcodone-acetaminophen (NORCO) 7.5-325 MG tablet  2. Pharmacy (Name, [id number] [place], [place] [address]  Phone:  [phone] Fax:  [phone]   3. On Med List: yes  4. Last Visit with PCP: 12.12.22  5. Next visit date with PCP: n/a   Agent: Please be advised that RX refills may take up to 3 business days. We ask that you follow-up with your pharmacy.

## 2021-11-19 ENCOUNTER — Other Ambulatory Visit: Payer: Self-pay

## 2021-11-19 MED ORDER — MAGNESIUM GLUCONATE 500 (27 MG) MG PO TABS: 500.0000 mg | ORAL_TABLET | Freq: Two times a day (BID) | ORAL | 0 refills | Status: DC

## 2021-11-19 MED ORDER — HYDROCODONE-ACETAMINOPHEN 7.5-325 MG PO TABS: 1.0000 | ORAL_TABLET | Freq: Four times a day (QID) | ORAL | 0 refills | Status: DC | PRN

## 2021-11-26 DIAGNOSIS — I5022 Chronic systolic (congestive) heart failure: Secondary | ICD-10-CM | POA: Diagnosis not present

## 2021-12-01 ENCOUNTER — Telehealth: Payer: Self-pay | Admitting: Internal Medicine

## 2021-12-01 DIAGNOSIS — N184 Chronic kidney disease, stage 4 (severe): Secondary | ICD-10-CM | POA: Diagnosis not present

## 2021-12-01 DIAGNOSIS — D0592 Unspecified type of carcinoma in situ of left breast: Secondary | ICD-10-CM | POA: Insufficient documentation

## 2021-12-01 DIAGNOSIS — M79671 Pain in right foot: Secondary | ICD-10-CM

## 2021-12-01 NOTE — Telephone Encounter (Signed)
Pt requesting a c/b regarding recommendations on "medical shoes" due to pain in her back and toes  Offered pt appt, pt declined stating if provider wants to see her, she will come in

## 2021-12-02 DIAGNOSIS — Z6828 Body mass index (BMI) 28.0-28.9, adult: Secondary | ICD-10-CM | POA: Diagnosis not present

## 2021-12-02 DIAGNOSIS — Z01419 Encounter for gynecological examination (general) (routine) without abnormal findings: Secondary | ICD-10-CM | POA: Diagnosis not present

## 2021-12-02 NOTE — Telephone Encounter (Signed)
I'm not really sure what she is asking - I can have her see podiatry if she likes, then DM shoes can be ordered that normally have wide toe box area, but I cannot recommend open toe shoes due to risk of injury

## 2021-12-02 NOTE — Telephone Encounter (Signed)
Patient and patient's daughter notified and would like referral to podiatry.

## 2021-12-02 NOTE — Telephone Encounter (Signed)
Ok this is done 

## 2021-12-08 ENCOUNTER — Other Ambulatory Visit: Payer: Self-pay | Admitting: Physician Assistant

## 2021-12-08 ENCOUNTER — Telehealth: Payer: Self-pay

## 2021-12-08 NOTE — Telephone Encounter (Signed)
LMOM for patient to call and schedule follow-up appointment.   °

## 2021-12-08 NOTE — Telephone Encounter (Signed)
Pt is calling requesting a refill on: HYDROcodone-acetaminophen (NORCO) 7.5-325 MG tablet  [id number] [place], [place] [address]  LOV: 10/06/21  Pt CB [phone]

## 2021-12-08 NOTE — Telephone Encounter (Signed)
Next Visit: Due January 2023. Message sent to the front to schedule.    Last Visit: 07/01/2021   Last Fill: 09/10/2021   Dx: Chondrocalcinosis   Current  Dose per office note on 07/01/2021: prednisone 7 mg p.o. daily.  She does not want to taper prednisone and I will prefer to stay on the same dose.    Okay to refill Prednisone?

## 2021-12-08 NOTE — Telephone Encounter (Signed)
Please schedule patient for a follow up visit. Patient was due January 2023. Thanks!

## 2021-12-10 ENCOUNTER — Other Ambulatory Visit: Payer: Self-pay | Admitting: Physician Assistant

## 2021-12-13 ENCOUNTER — Other Ambulatory Visit: Payer: Self-pay | Admitting: Physician Assistant

## 2021-12-15 ENCOUNTER — Ambulatory Visit (INDEPENDENT_AMBULATORY_CARE_PROVIDER_SITE_OTHER): Payer: Medicare HMO

## 2021-12-15 ENCOUNTER — Other Ambulatory Visit: Payer: Self-pay

## 2021-12-15 ENCOUNTER — Telehealth: Payer: Self-pay

## 2021-12-15 ENCOUNTER — Ambulatory Visit: Payer: Medicare HMO | Admitting: Podiatry

## 2021-12-15 DIAGNOSIS — M778 Other enthesopathies, not elsewhere classified: Secondary | ICD-10-CM

## 2021-12-15 DIAGNOSIS — M79675 Pain in left toe(s): Secondary | ICD-10-CM | POA: Diagnosis not present

## 2021-12-15 DIAGNOSIS — M79672 Pain in left foot: Secondary | ICD-10-CM

## 2021-12-15 DIAGNOSIS — M2141 Flat foot [pes planus] (acquired), right foot: Secondary | ICD-10-CM | POA: Diagnosis not present

## 2021-12-15 DIAGNOSIS — M79674 Pain in right toe(s): Secondary | ICD-10-CM | POA: Diagnosis not present

## 2021-12-15 DIAGNOSIS — B351 Tinea unguium: Secondary | ICD-10-CM

## 2021-12-15 DIAGNOSIS — I739 Peripheral vascular disease, unspecified: Secondary | ICD-10-CM | POA: Diagnosis not present

## 2021-12-15 DIAGNOSIS — M19072 Primary osteoarthritis, left ankle and foot: Secondary | ICD-10-CM | POA: Diagnosis not present

## 2021-12-15 DIAGNOSIS — I999 Unspecified disorder of circulatory system: Secondary | ICD-10-CM

## 2021-12-15 DIAGNOSIS — M2142 Flat foot [pes planus] (acquired), left foot: Secondary | ICD-10-CM | POA: Diagnosis not present

## 2021-12-15 NOTE — Progress Notes (Signed)
SITUATION Reason for Consult: Evaluation for Bilateral Custom Foot Orthoses Patient / Caregiver Report: Patient is ready for foot orthotics  OBJECTIVE DATA: Patient History / Diagnosis:    ICD-10-CM   1. Extensor tendinitis of foot  M77.8       Current or Previous Devices: None and no history  Foot Examination: Skin presentation:   Intact Ulcers & Callousing:   None and no history Toe / Foot Deformities:  Pes planus Weight Bearing Presentation:  planus Sensation:    Intact  Shoe Size: 42M  ORTHOTIC RECOMMENDATION Recommended Device: 1x pair of custom functional foot orthotics  GOALS OF ORTHOSES - Reduce Pain - Prevent Foot Deformity - Prevent Progression of Further Foot Deformity - Relieve Pressure - Improve the Overall Biomechanical Function of the Foot and Lower Extremity.  ACTIONS PERFORMED Patient was casted for Foot Orthoses via crush box. Procedure was explained and patient tolerated procedure well. All questions were answered and concerns addressed.  PLAN Potential out of pocket cost was communicated to patient. Casts are to be sent to [facility] for fabrication. Patient is to be called for fitting when devices are ready.

## 2021-12-15 NOTE — Telephone Encounter (Signed)
Casts Sent to [employer]

## 2021-12-17 NOTE — Progress Notes (Signed)
Subjective: 86 year old female presents the office today for concerns of pain to her left foot.  She says been hurting for some time it started last year.  She said that she had back pain and she had an injection which did help her feet and her legs some.  She is looking for orthotics or shoes.  No recent injury to her feet.  Also asking for nails be trimmed dystrophic and elongated she cannot trim herself.  Objective: AAO x3, NAD DP/PT pulses decreased bilaterally, CRT less than 3 seconds Sensation somewhat decreased with Semmes Weinstein monofilament  Nails are hypertrophic, dystrophic, brittle, discolored, elongated 10. No surrounding redness or drainage. Tenderness nails 1-5 bilaterally. No open lesions or pre-ulcerative lesions are identified today. Severe flattening of the arch on the left side worse than the right there is decreased subtalar joint range of motion.  There is no specific area pinpoint tenderness but the majority discomfort is more on the medial aspect of the arch and medial foot along navicular tuberosity.  MMT 5/5.  No pain with calf compression, swelling, warmth, erythema  Assessment: Severe flatfoot, arthritis left  Plan: -All treatment options discussed with the patient including all alternatives, risks, complications.  -X-rays obtained reviewed.  Severe flattening of the arch and arthritic changes present to midfoot.  Evidence of acute fracture. -We discussed shoes, orthotics as well as possible bracing.  We will have her follow-up with our orthotist, Aaron Edelman for evaluation.  Continue gabapentin. -Sharply debrided nails x10 without any complications or bleeding -ABI ordered -Patient encouraged to call the office with any questions, concerns, change in symptoms.   Trula Slade DPM

## 2021-12-22 DIAGNOSIS — R3129 Other microscopic hematuria: Secondary | ICD-10-CM | POA: Diagnosis not present

## 2021-12-22 DIAGNOSIS — N281 Cyst of kidney, acquired: Secondary | ICD-10-CM | POA: Diagnosis not present

## 2021-12-22 DIAGNOSIS — R809 Proteinuria, unspecified: Secondary | ICD-10-CM | POA: Diagnosis not present

## 2021-12-22 DIAGNOSIS — R600 Localized edema: Secondary | ICD-10-CM | POA: Diagnosis not present

## 2021-12-22 DIAGNOSIS — N184 Chronic kidney disease, stage 4 (severe): Secondary | ICD-10-CM | POA: Diagnosis not present

## 2021-12-22 DIAGNOSIS — I129 Hypertensive chronic kidney disease with stage 1 through stage 4 chronic kidney disease, or unspecified chronic kidney disease: Secondary | ICD-10-CM | POA: Diagnosis not present

## 2021-12-22 DIAGNOSIS — E871 Hypo-osmolality and hyponatremia: Secondary | ICD-10-CM | POA: Diagnosis not present

## 2021-12-22 DIAGNOSIS — R7303 Prediabetes: Secondary | ICD-10-CM | POA: Diagnosis not present

## 2021-12-23 ENCOUNTER — Other Ambulatory Visit: Payer: Self-pay | Admitting: Podiatry

## 2021-12-23 DIAGNOSIS — M778 Other enthesopathies, not elsewhere classified: Secondary | ICD-10-CM

## 2021-12-24 ENCOUNTER — Telehealth: Payer: Self-pay | Admitting: Internal Medicine

## 2021-12-24 DIAGNOSIS — I5022 Chronic systolic (congestive) heart failure: Secondary | ICD-10-CM | POA: Diagnosis not present

## 2021-12-24 NOTE — Telephone Encounter (Signed)
Patient informed about the recommended vitamin she can take .  ?

## 2021-12-24 NOTE — Telephone Encounter (Signed)
Pt inquiring if she should be taking vitamins and which type ? ?Pt requesting a c/b ?

## 2021-12-24 NOTE — Telephone Encounter (Signed)
Ok for centrum silver - 1 per day ?

## 2021-12-27 ENCOUNTER — Other Ambulatory Visit: Payer: Self-pay | Admitting: Physical Medicine and Rehabilitation

## 2021-12-30 ENCOUNTER — Telehealth: Payer: Self-pay | Admitting: Internal Medicine

## 2021-12-30 NOTE — Telephone Encounter (Signed)
Home Health verbal orders-caller/Agency: [place]/ [facility] ? ?Callback number: [phone] ? ?Requesting OT/PT/Skilled nursing/Social Work/Speech: Nursing ? ?Reason: uncontrol pain nds pain management ? ?Frequency: 2 additional visits ?

## 2021-12-30 NOTE — Telephone Encounter (Signed)
Ok for verbals 

## 2021-12-31 ENCOUNTER — Ambulatory Visit (HOSPITAL_COMMUNITY)
Admission: RE | Admit: 2021-12-31 | Discharge: 2021-12-31 | Disposition: A | Discharge status: Home / Self Care | Payer: Medicare HMO | Source: Ambulatory Visit | Attending: Podiatry | Admitting: Podiatry

## 2021-12-31 ENCOUNTER — Other Ambulatory Visit: Payer: Self-pay

## 2021-12-31 DIAGNOSIS — I739 Peripheral vascular disease, unspecified: Secondary | ICD-10-CM | POA: Insufficient documentation

## 2021-12-31 NOTE — Telephone Encounter (Signed)
Verbals given  

## 2022-01-03 ENCOUNTER — Other Ambulatory Visit: Payer: Self-pay | Admitting: Physician Assistant

## 2022-01-10 ENCOUNTER — Other Ambulatory Visit: Payer: Self-pay | Admitting: Internal Medicine

## 2022-01-10 ENCOUNTER — Other Ambulatory Visit: Payer: Self-pay | Admitting: Physician Assistant

## 2022-01-12 ENCOUNTER — Ambulatory Visit: Payer: Medicare HMO

## 2022-01-12 ENCOUNTER — Other Ambulatory Visit: Payer: Self-pay

## 2022-01-12 DIAGNOSIS — M778 Other enthesopathies, not elsewhere classified: Secondary | ICD-10-CM

## 2022-01-12 DIAGNOSIS — K8020 Calculus of gallbladder without cholecystitis without obstruction: Secondary | ICD-10-CM | POA: Diagnosis not present

## 2022-01-12 DIAGNOSIS — M2141 Flat foot [pes planus] (acquired), right foot: Secondary | ICD-10-CM

## 2022-01-12 DIAGNOSIS — N289 Disorder of kidney and ureter, unspecified: Secondary | ICD-10-CM | POA: Diagnosis not present

## 2022-01-12 NOTE — Progress Notes (Signed)
SITUATION: ?Reason for Visit: Fitting and Delivery of Custom Fabricated Foot Orthoses ?Patient Report: Patient reports comfort and is satisfied with device. ? ?OBJECTIVE DATA: ?Patient History / Diagnosis:   ?  ICD-10-CM   ?1. Pes planus of both feet  M21.41   ? M21.42   ?  ?2. Extensor tendinitis of foot  M77.8   ?  ? ? ?Provided Device:  Custom Functional Foot Orthotics ?    [facility]: [id number] ? ?GOAL OF ORTHOSIS ?- Improve gait ?- Decrease energy expenditure ?- Improve Balance ?- Provide Triplanar stability of foot complex ?- Facilitate motion ? ?ACTIONS PERFORMED ?Patient was fit with foot orthotics trimmed to shoe last. Patient tolerated fittign procedure.  ? ?Patient was provided with verbal and written instruction and demonstration regarding donning, doffing, wear, care, proper fit, function, purpose, cleaning, and use of the orthosis and in all related precautions and risks and benefits regarding the orthosis. ? ?Patient was also provided with verbal instruction regarding how to report any failures or malfunctions of the orthosis and necessary follow up care. Patient was also instructed to contact our office regarding any change in status that may affect the function of the orthosis. ? ?Patient demonstrated independence with proper donning, doffing, and fit and verbalized understanding of all instructions. ? ?PLAN: ?Patient is to follow up in one week or as necessary (PRN). All questions were answered and concerns addressed. Plan of care was discussed with and agreed upon by the patient. ? ?

## 2022-01-21 DIAGNOSIS — Z1231 Encounter for screening mammogram for malignant neoplasm of breast: Secondary | ICD-10-CM | POA: Diagnosis not present

## 2022-01-24 DIAGNOSIS — I5022 Chronic systolic (congestive) heart failure: Secondary | ICD-10-CM | POA: Diagnosis not present

## 2022-01-28 ENCOUNTER — Ambulatory Visit: Payer: Medicare HMO | Admitting: Internal Medicine

## 2022-01-29 ENCOUNTER — Other Ambulatory Visit: Payer: Self-pay | Admitting: Physician Assistant

## 2022-02-02 ENCOUNTER — Encounter: Payer: Self-pay | Admitting: Internal Medicine

## 2022-02-02 ENCOUNTER — Ambulatory Visit (INDEPENDENT_AMBULATORY_CARE_PROVIDER_SITE_OTHER): Payer: Medicare HMO | Admitting: Internal Medicine

## 2022-02-02 VITALS — BP 126/70 | HR 79 | Temp 98.3°F | Ht 61.0 in | Wt 153.0 lb

## 2022-02-02 DIAGNOSIS — Z0001 Encounter for general adult medical examination with abnormal findings: Secondary | ICD-10-CM | POA: Diagnosis not present

## 2022-02-02 DIAGNOSIS — E538 Deficiency of other specified B group vitamins: Secondary | ICD-10-CM

## 2022-02-02 DIAGNOSIS — E559 Vitamin D deficiency, unspecified: Secondary | ICD-10-CM | POA: Diagnosis not present

## 2022-02-02 DIAGNOSIS — N184 Chronic kidney disease, stage 4 (severe): Secondary | ICD-10-CM | POA: Diagnosis not present

## 2022-02-02 DIAGNOSIS — R7302 Impaired glucose tolerance (oral): Secondary | ICD-10-CM | POA: Diagnosis not present

## 2022-02-02 DIAGNOSIS — I5032 Chronic diastolic (congestive) heart failure: Secondary | ICD-10-CM | POA: Diagnosis not present

## 2022-02-02 LAB — CBC WITH DIFFERENTIAL/PLATELET
Basophils Absolute: 0 10*3/uL (ref 0.0–0.1)
Basophils Relative: 0.5 % (ref 0.0–3.0)
Eosinophils Absolute: 0.1 10*3/uL (ref 0.0–0.7)
Eosinophils Relative: 1.6 % (ref 0.0–5.0)
HCT: 37.2 % (ref 36.0–46.0)
Hemoglobin: 12.3 g/dL (ref 12.0–15.0)
Lymphocytes Relative: 15.5 % (ref 12.0–46.0)
Lymphs Abs: 1.3 10*3/uL (ref 0.7–4.0)
MCHC: 33 g/dL (ref 30.0–36.0)
MCV: 103.2 fl — ABNORMAL HIGH (ref 78.0–100.0)
Monocytes Absolute: 0.7 10*3/uL (ref 0.1–1.0)
Monocytes Relative: 7.6 % (ref 3.0–12.0)
Neutro Abs: 6.5 10*3/uL (ref 1.4–7.7)
Neutrophils Relative %: 74.8 % (ref 43.0–77.0)
Platelets: 234 10*3/uL (ref 150.0–400.0)
RBC: 3.6 Mil/uL — ABNORMAL LOW (ref 3.87–5.11)
RDW: 13.6 % (ref 11.5–15.5)
WBC: 8.7 10*3/uL (ref 4.0–10.5)

## 2022-02-02 LAB — HEPATIC FUNCTION PANEL
ALT: 32 U/L (ref 0–35)
AST: 22 U/L (ref 0–37)
Albumin: 4.3 g/dL (ref 3.5–5.2)
Alkaline Phosphatase: 59 U/L (ref 39–117)
Bilirubin, Direct: 0.1 mg/dL (ref 0.0–0.3)
Total Bilirubin: 0.4 mg/dL (ref 0.2–1.2)
Total Protein: 7 g/dL (ref 6.0–8.3)

## 2022-02-02 LAB — HEMOGLOBIN A1C: Hgb A1c MFr Bld: 6.2 % (ref 4.6–6.5)

## 2022-02-02 LAB — BASIC METABOLIC PANEL
BUN: 68 mg/dL — ABNORMAL HIGH (ref 6–23)
CO2: 37 mEq/L — ABNORMAL HIGH (ref 19–32)
Calcium: 10 mg/dL (ref 8.4–10.5)
Chloride: 97 mEq/L (ref 96–112)
Creatinine, Ser: 1.94 mg/dL — ABNORMAL HIGH (ref 0.40–1.20)
GFR: 23.4 mL/min — ABNORMAL LOW (ref >60.00)
Glucose, Bld: 116 mg/dL — ABNORMAL HIGH (ref 70–99)
Potassium: 3.5 mEq/L (ref 3.5–5.1)
Sodium: 144 mEq/L (ref 135–145)

## 2022-02-02 LAB — TSH: TSH: 1.46 u[IU]/mL (ref 0.35–5.50)

## 2022-02-02 LAB — LIPID PANEL
Cholesterol: 246 mg/dL — ABNORMAL HIGH (ref 0–200)
HDL: 85.2 mg/dL (ref >39.00)
NonHDL: 160.43
Total CHOL/HDL Ratio: 3
Triglycerides: 214 mg/dL — ABNORMAL HIGH (ref 0.0–149.0)
VLDL: 42.8 mg/dL — ABNORMAL HIGH (ref 0.0–40.0)

## 2022-02-02 LAB — VITAMIN D 25 HYDROXY (VIT D DEFICIENCY, FRACTURES): VITD: 60.42 ng/mL (ref 30.00–100.00)

## 2022-02-02 LAB — LDL CHOLESTEROL, DIRECT: Direct LDL: 137 mg/dL

## 2022-02-02 LAB — VITAMIN B12: Vitamin B-12: >1504 pg/mL — ABNORMAL HIGH (ref 211–911)

## 2022-02-02 NOTE — Patient Instructions (Signed)
Please only take the fluid pill lasix at 40 mg once per day ? ?Please remember to see Renal (kidney doctor) in late May as planned ? ?Please continue all other medications as before, and refills have been done if requested. ? ?Please have the pharmacy call with any other refills you may need. ? ?Please continue your efforts at being more active, low cholesterol diet, and weight control. ? ?You are otherwise up to date with prevention measures today. ? ?Please keep your appointments with your specialists as you may have planned ? ?Please go to the LAB at the blood drawing area for the tests to be done ? ?You will be contacted by phone if any changes need to be made immediately.  Otherwise, you will receive a letter about your results with an explanation, but please check with MyChart first. ? ?Please remember to sign up for MyChart if you have not done so, as this will be important to you in the future with finding out test results, communicating by private email, and scheduling acute appointments online when needed. ? ?Please make an Appointment to return in 6 months, or sooner if needed ?

## 2022-02-02 NOTE — Progress Notes (Signed)
Patient ID: [name], female   DOB: [birth year], 86 y.o.   MRN: [id number] ? ? ? ?     Chief Complaint:: wellness exam and Office Visit (Problems with shoulders) ? , chf, ckd, low b12 ? ?     HPI:  [name] is a 86 y.o. female here for wellness exam; decliens covid bosoter, shingrix, o/w up to date ?         ?              Also daughter back to work since feb 2023, so now mostly at home on her own.  Takes her wt several times per wk, and usually stable.  Pt admits got confused after seeing renal about the lasix; believes she was told there to take the lasix 40 bid and water restriction, so has been doing this since her MRI about mar 20, which is higher than the recommneded 40 mg dialy dosing per Dr Royce Macadamia on Feb 27 (note on EMR).  Pt denies chest pain, increased sob or doe, wheezing, orthopnea, PND, increased LE swelling, palpitations, dizziness or syncope.   Pt denies polydipsia, polyuria, or new focal neuro s/s.   Pt denies fever, wt loss, night sweats, loss of appetite, or other constitutional symptoms    Does have bilateral shoulder achy pain for several weeks worsening, has hx of PMR.   ?  ?Wt Readings from Last 3 Encounters:  ?02/04/22 155 lb (70.3 kg)  ?02/02/22 153 lb (69.4 kg)  ?10/06/21 153 lb (69.4 kg)  ? ?BP Readings from Last 3 Encounters:  ?02/04/22 (!) 142/82  ?02/02/22 126/70  ?10/06/21 (!) 100/58  ? ?Immunization History  ?Administered Date(s) Administered  ? Fluad Quad(high Dose 65+) 11/27/2020, 06/27/2021  ? Influenza Split 08/07/2011, 10/13/2012  ? Influenza Whole 07/27/2008, 08/14/2009, 07/01/2010  ? Influenza, High Dose Seasonal PF 07/10/2016, 08/10/2017, 08/30/2018, 09/08/2019  ? Influenza,inj,Quad PF,6+ Mos 08/28/2013, 07/18/2014, 07/26/2015  ? PFIZER(Purple Top)SARS-COV-2 Vaccination 12/11/2019, 01/02/2020  ? Pneumococcal Conjugate-13 02/20/2014  ? Pneumococcal Polysaccharide-23 10/26/2005  ? Tdap 11/22/2017  ? ?There are no preventive care reminders to display for this  patient. ? ?  ? ?Past Medical History:  ?Diagnosis Date  ? Allergic rhinitis 10/30/2016  ? Anemia   ? Asthma   ? Breast cancer of upper-outer quadrant of left female breast ([place]) 11/08/2013  ? ER/PR+ Her2- Left IDC   ? Chronic renal insufficiency   ? Chronic rhinitis   ? Colon polyp   ? Diastolic dysfunction 3/78/5885  ? DJD (degenerative joint disease)   ? Dyspnea   ? Full dentures   ? GERD (gastroesophageal reflux disease)   ? Hearing loss   ? Hypertension   ? Hyponatremia   ? Impaired glucose tolerance 07/18/2014  ? Memory loss   ? Morbid obesity ([place])   ? Poor circulation   ? Vertigo   ? Wears glasses   ? ?Past Surgical History:  ?Procedure Laterality Date  ? ABDOMINAL HYSTERECTOMY    ? BREAST LUMPECTOMY WITH NEEDLE LOCALIZATION AND AXILLARY SENTINEL LYMPH NODE BX Left 12/04/2013  ? Procedure: BREAST LUMPECTOMY WITH NEEDLE LOCALIZATION AND AXILLARY SENTINEL LYMPH NODE BX;  Surgeon: Shann Medal, MD;  Location: [place];  Service: General;  Laterality: Left;  ? CATARACT EXTRACTION  2009  ? rt  ? COLONOSCOPY    ? EYE SURGERY Bilateral   ? cataract surgery  ? KNEE ARTHROSCOPY    ? both  ? LUMBAR LAMINECTOMY/DECOMPRESSION MICRODISCECTOMY Left 12/23/2017  ?  Procedure: Left Lumbar One-Two Laminectomy with microdiscectomy;  Surgeon: Eustace Moore, MD;  Location: [place];  Service: Neurosurgery;  Laterality: Left;  Left L1-2 Laminectomy with microdiscectomy  ? TONSILLECTOMY    ? TOTAL KNEE ARTHROPLASTY  2002  ? rt  ? TOTAL KNEE ARTHROPLASTY  2003  ? left  ? VESICOVAGINAL FISTULA CLOSURE W/ TAH  1980  ? ? reports that she has never smoked. She has never used smokeless tobacco. She reports that she does not drink alcohol and does not use drugs. ?family history includes Colon cancer in her mother; Healthy in her daughter; Heart attack (age of onset: 105) in her son; Lung cancer in her brother; Stomach cancer in her mother. ?Allergies  ?Allergen Reactions  ? Hydrocodone Itching  ? Lasix [Furosemide] Other (See  Comments)  ?  Dizziness.   ? Tizanidine Other (See Comments)  ?  Dizzy and fall  ? Iron Hives and Other (See Comments)  ?   bad constipation ?  ? ?Current Outpatient Medications on File Prior to Visit  ?Medication Sig Dispense Refill  ? albuterol (VENTOLIN HFA) 108 (90 Base) MCG/ACT inhaler Inhale 1 puff into the lungs every 6 (six) hours as needed for wheezing or shortness of breath.    ? amLODipine (NORVASC) 5 MG tablet TAKE 1 TABLET BY MOUTH EVERY DAY (Patient taking differently: Take 5 mg by mouth every morning.) 90 tablet 3  ? aspirin EC 81 MG tablet Take 81 mg by mouth every morning. Swallow whole.    ? Cholecalciferol (VITAMIN D3) 20 MCG (800 UNIT) TABS Take 1 tablet by mouth daily.    ? diclofenac Sodium (VOLTAREN) 1 % GEL APPLY 2 GRAMS TO AFFECTED AREA 4 TIMES A DAY (Patient taking differently: Apply 1 application. topically 4 (four) times daily as needed (pain).) 100 g 5  ? Ensure (ENSURE) Take 1 Can by mouth 3 (three) times daily between meals. (Patient taking differently: Take 237 mLs by mouth See admin instructions. Drink one can (237 mls) by mouth once or twice daily) 237 mL 12  ? fluticasone-salmeterol (ADVAIR DISKUS) 250-50 MCG/ACT AEPB Inhale 1 puff into the lungs in the morning and at bedtime. 180 each 3  ? furosemide (LASIX) 40 MG tablet Take 1 tablet (40 mg total) by mouth every Monday, Wednesday, and Friday.    ? gabapentin (NEURONTIN) 300 MG capsule TAKE 1 CAPSULE BY MOUTH THREE TIMES A DAY 90 capsule 3  ? HYDROcodone-acetaminophen (NORCO) 7.5-325 MG tablet Take 1 tablet by mouth every 6 (six) hours as needed for moderate pain. (Patient not taking: Reported on 02/04/2022) 40 tablet 0  ? Incontinence Supply Disposable (DEPEND UNDERWEAR SM/MED) MISC Use as directed four times per day 120 each 5  ? magnesium gluconate (MAGONATE) 500 MG tablet TAKE 1 TABLET BY MOUTH TWICE A DAY 90 tablet 3  ? meclizine (ANTIVERT) 12.5 MG tablet TAKE 1 TABLET BY MOUTH THREE TIMES A DAY AS NEEDED FOR DIZZINESS  (Patient taking differently: Take 12.5 mg by mouth 3 (three) times daily as needed for dizziness.) 90 tablet 2  ? montelukast (SINGULAIR) 10 MG tablet TAKE 1 TABLET BY MOUTH EVERY DAY 90 tablet 1  ? Multiple Vitamin (MULTIVITAMIN WITH MINERALS) TABS tablet Take 1 tablet by mouth daily. Centrum Silver    ? pantoprazole (PROTONIX) 40 MG tablet TAKE 1 TABLET BY MOUTH EVERY DAY 90 tablet 3  ? potassium chloride (KLOR-CON) 10 MEQ tablet TAKE 1 TABLET BY MOUTH EVERY DAY WHEN TAKING FUROSEMIDE (Patient taking differently: Take  10 mEq by mouth every Monday, Wednesday, and Friday.) 90 tablet 3  ? predniSONE (DELTASONE) 1 MG tablet TAKE 2 TABLETS BY MOUTH EVERY DAY 60 tablet 0  ? predniSONE (DELTASONE) 5 MG tablet TAKE 1 TABLET BY MOUTH EVERY DAY WITH BREAKFAST 30 tablet 0  ? triamcinolone cream (KENALOG) 0.1 % APPLY TO AFFECTED AREA TWICE A DAY (Patient taking differently: Apply 1 application. topically 2 (two) times daily as needed (rash/irritation).) 30 g 1  ? ?No current facility-administered medications on file prior to visit.  ? ?     ROS:  All others reviewed and negative. ? ?Objective  ? ?     PE:  BP 126/70 (BP Location: Right Arm, Patient Position: Sitting, Cuff Size: Normal)   Pulse 79   Temp 98.3 ?F (36.8 ?C) (Oral)   Ht _0  (1.549 m)   Wt 153 lb (69.4 kg)   SpO2 99%   BMI 28.91 kg/m?  ? ?              Constitutional: Pt appears in NAD ?              HENT: Head: NCAT.  ?              Right Ear: External ear normal.   ?              Left Ear: External ear normal.  ?              Eyes: . Pupils are equal, round, and reactive to light. Conjunctivae and EOM are normal ?              Nose: without d/c or deformity ?              Neck: Neck supple. Gross normal ROM ?              Cardiovascular: Normal rate and regular rhythm.   ?              Pulmonary/Chest: Effort normal and breath sounds without rales or wheezing.  ?              Abd:  Soft, NT, ND, + BS, no organomegaly ?              Neurological: Pt is  alert. At baseline orientation, motor grossly intact ?              Skin: Skin is warm. No rashes, no other new lesions, LE edema - none ?              Psychiatric: Pt behavior is normal without agitation  ? ?Micro: non

## 2022-02-03 NOTE — Progress Notes (Signed)
? ? ?Subjective:   ? ?CC: B shoulder pain ? ?I, [name], LAT, ATC, am serving as scribe for Dr. Lynne Leader. ? ?HPI: Pt is an 86 y/o female c/o chronic B shoulder pain x over 1 year, L>R. No falls noted. She locates her pain to all over both [place] joints and into the upper arm.  She was seen by Dr. Estanislado Pandy previously for this complaint in 2021. ? ?Radiating pain: yes- distally to elbow and into trapz and neck ?Neck pain: yes ?Shoulder mechanical symptoms: yes ?Aggravating factors: IR, overhead, pulling underwear up ?Treatments tried: prior steroid injection (@ [facility]) ? ?Diagnostic testing: R and L shoulder XR- 08/20/20 ? ?Pertinent review of Systems: No fevers or chills ? ?Relevant historical information: Rheumatoid arthritis ? ? ?Objective:   ? ?Vitals:  ? 02/04/22 0903  ?BP: (!) 142/82  ?Pulse: 73  ?SpO2: 97%  ? ?General: Well Developed, well nourished, and in no acute distress.  ? ?MSK: Right shoulder: Normal-appearing decreased range of motion to external rotation internal rotation and abduction.  Decreased strength with pain. ? ?Left shoulder: Normal-appearing decreased range of motion to external rotation and rotation abduction.  Decreased strength with pain. ? ?Pulses capillary fill and sensation are intact distally. ? ?Lab and Radiology Results ? ?Procedure: Real-time Ultrasound Guided Injection of shoulder glenohumeral joint posterior approach ?Device: Philips Affiniti 50G ?Images permanently stored and available for review in PACS ?Ultrasound evaluation prior to injection does reveal hypoechoic fluid collecting superficial to the rotator cuff tendon at the anterior and superior shoulder.  Significant degenerative appearance of the posterior glenohumeral joint on ultrasound visible. ?Verbal informed consent obtained.  Discussed risks and benefits of procedure. Warned about infection, bleeding, hyperglycemia damage to structures among others. ?Patient expresses understanding and  agreement ?Time-out conducted.   ?Noted no overlying erythema, induration, or other signs of local infection.   ?Skin prepped in a sterile fashion.   ?Local anesthesia: Topical Ethyl chloride.   ?With sterile technique and under real time ultrasound guidance: 40 mg of Kenalog and 2 mL of Marcaine injected into glenohumeral joint. Fluid seen entering the joint capsule.   ?Completed without difficulty   ?Pain moderately resolved suggesting accurate placement of the medication.   ?Advised to call if fevers/chills, erythema, induration, drainage, or persistent bleeding.   ?Images permanently stored and available for review in the ultrasound unit.  ?Impression: Technically successful ultrasound guided injection. ? ? ? ?Procedure: Real-time Ultrasound Guided Injection of right shoulder glenohumeral joint ?Device: Philips Affiniti 50G ?Images permanently stored and available for review in PACS ?Ultrasound evaluation prior to injection reveals a moderate joint effusion. ?Verbal informed consent obtained.  Discussed risks and benefits of procedure. Warned about infection, bleeding, hyperglycemia damage to structures among others. ?Patient expresses understanding and agreement ?Time-out conducted.   ?Noted no overlying erythema, induration, or other signs of local infection.   ?Skin prepped in a sterile fashion.   ?Local anesthesia: Topical Ethyl chloride.   ?With sterile technique and under real time ultrasound guidance: 40 mg of Kenalog and 2 mL of Marcaine injected into glenohumeral joint. Fluid seen entering the joint capsule.   ?Completed without difficulty   ?Pain moderately resolved suggesting accurate placement of the medication.   ?Advised to call if fevers/chills, erythema, induration, drainage, or persistent bleeding.   ?Images permanently stored and available for review in the ultrasound unit.  ?Impression: Technically successful ultrasound guided injection. ? ? ? ?X-ray images bilateral shoulders obtained today  personally and independently interpreted. ?X-ray images  compared to bilateral shoulder x-rays from 2021. ? ?Right shoulder: Severe glenohumeral DJD with elevation of the humeral head in relationship to the glenoid.  Sclerosis present at the glenohumeral joint.  No acute fractures are present. ? ?Left shoulder: Severe glenohumeral DJD.  Sclerosis present at the glenohumeral joint.  No acute fractures are present. ? ?Await formal radiology review ? ? ? ?Impression and Recommendations:   ? ?Assessment and Plan: ?86 y.o. female with severe glenohumeral DJD bilaterally on x-ray in the setting of bilateral shoulder pain and lack of range of motion.  She is failing trials of occupational therapy with home health.  Plan for glenohumeral injection today.  Hopefully this will provide some benefit.  If not we can try injecting elsewhere in the shoulder to see if she gets some other benefit.  She does have some hypoechoic fluid collection superficial to the rotator cuff on the left shoulder which could be a target for injection. ? ?She mentioned that she is not interested in shoulder replacement. ? ?Recheck back as needed. ? ?PDMP not reviewed this encounter. ?Orders Placed This Encounter  ?Procedures  ? [place] LIMITED JOINT SPACE STRUCTURES UP BILAT(NO LINKED CHARGES)  ?  Order Specific Question:   Reason for Exam (SYMPTOM  OR DIAGNOSIS REQUIRED)  ?  Answer:   B shoulder pain  ?  Order Specific Question:   Preferred imaging location?  ?  Answer:   [facility]  ? DG Shoulder Left  ?  Standing Status:   Future  ?  Number of Occurrences:   1  ?  Standing Expiration Date:   03/06/2022  ?  Order Specific Question:   Reason for Exam (SYMPTOM  OR DIAGNOSIS REQUIRED)  ?  Answer:   bilateral shoulder pain  ?  Order Specific Question:   Preferred imaging location?  ?  Answer:   [facility]  ? DG Shoulder Right  ?  Standing Status:   Future  ?  Number of Occurrences:   1  ?  Standing Expiration Date:    02/05/2023  ?  Order Specific Question:   Reason for Exam (SYMPTOM  OR DIAGNOSIS REQUIRED)  ?  Answer:   bilateral shoulder pain  ?  Order Specific Question:   Preferred imaging location?  ?  Answer:   [facility]  ? ?No orders of the defined types were placed in this encounter. ? ? ?Discussed warning signs or symptoms. Please see discharge instructions. Patient expresses understanding. ? ? ?The above documentation has been reviewed and is accurate and complete Lynne Leader, M.D. ? ?

## 2022-02-04 ENCOUNTER — Ambulatory Visit (INDEPENDENT_AMBULATORY_CARE_PROVIDER_SITE_OTHER): Payer: Medicare HMO

## 2022-02-04 ENCOUNTER — Encounter: Payer: Self-pay | Admitting: Internal Medicine

## 2022-02-04 ENCOUNTER — Ambulatory Visit: Payer: Self-pay

## 2022-02-04 ENCOUNTER — Ambulatory Visit: Payer: Medicare HMO | Admitting: Family Medicine

## 2022-02-04 ENCOUNTER — Other Ambulatory Visit: Payer: Self-pay | Admitting: Physician Assistant

## 2022-02-04 VITALS — BP 142/82 | HR 73 | Ht 61.0 in | Wt 155.0 lb

## 2022-02-04 DIAGNOSIS — M25511 Pain in right shoulder: Secondary | ICD-10-CM

## 2022-02-04 DIAGNOSIS — G8929 Other chronic pain: Secondary | ICD-10-CM

## 2022-02-04 DIAGNOSIS — N179 Acute kidney failure, unspecified: Secondary | ICD-10-CM

## 2022-02-04 DIAGNOSIS — M25512 Pain in left shoulder: Secondary | ICD-10-CM | POA: Diagnosis not present

## 2022-02-04 DIAGNOSIS — M19012 Primary osteoarthritis, left shoulder: Secondary | ICD-10-CM | POA: Insufficient documentation

## 2022-02-04 DIAGNOSIS — M19011 Primary osteoarthritis, right shoulder: Secondary | ICD-10-CM | POA: Diagnosis not present

## 2022-02-04 LAB — URINALYSIS, ROUTINE W REFLEX MICROSCOPIC
Bilirubin Urine: NEGATIVE
Ketones, ur: NEGATIVE
Nitrite: NEGATIVE
Specific Gravity, Urine: <=1.005 — AB (ref 1.000–1.030)
Total Protein, Urine: NEGATIVE
Urine Glucose: NEGATIVE
Urobilinogen, UA: 0.2 (ref 0.0–1.0)
pH: 6 (ref 5.0–8.0)

## 2022-02-04 NOTE — Patient Instructions (Addendum)
Thank you for coming in today.  ? ?Please get an Xray today before you leave  ? ?You received steroid injections in both of your shoulders today. Seek immediate medical attention if the joint becomes red, extremely painful, or is oozing fluid.  ? ?Let me know if these shots don't help ? ?Recheck back as needed ?

## 2022-02-05 NOTE — Progress Notes (Signed)
Right shoulder x-ray shows severe arthritis and evidence of rotator cuff tears

## 2022-02-05 NOTE — Progress Notes (Signed)
Left shoulder x-ray shows severe arthritis.

## 2022-02-07 ENCOUNTER — Other Ambulatory Visit: Payer: Self-pay | Admitting: Physician Assistant

## 2022-02-08 ENCOUNTER — Encounter: Payer: Self-pay | Admitting: Internal Medicine

## 2022-02-08 DIAGNOSIS — E538 Deficiency of other specified B group vitamins: Secondary | ICD-10-CM | POA: Insufficient documentation

## 2022-02-08 NOTE — Assessment & Plan Note (Signed)
Last vitamin D Lab Results  Component Value Date   VD25OH 60.42 02/02/2022   Stable, cont oral replacement  

## 2022-02-08 NOTE — Assessment & Plan Note (Signed)
Also for f/u lab today on accidental increased lasix,  to f/u any worsening symptoms or concerns ?

## 2022-02-08 NOTE — Assessment & Plan Note (Addendum)
Age and sex appropriate education and counseling updated with regular exercise and diet Referrals for preventative services - none needed Immunizations addressed - decliens covid booster, shingrix Smoking counseling  - none needed Evidence for depression or other mood disorder - none significant Most recent labs reviewed. I have personally reviewed and have noted: 1) the patient's medical and social history 2) The patient's current medications and supplements 3) The patient's height, weight, and BMI have been recorded in the chart  

## 2022-02-08 NOTE — Assessment & Plan Note (Signed)
Lab Results  ?Component Value Date  ? HGBA1C 6.2 02/02/2022  ? ?Stable, pt to continue current medical treatment  - done ? ?

## 2022-02-08 NOTE — Assessment & Plan Note (Addendum)
I suspect pt with mild low volume, for reduced lasix to 40 qd, check cr and K ?

## 2022-02-08 NOTE — Assessment & Plan Note (Signed)
Lab Results  ?Component Value Date  ? NPYYFRTM21 >1504 (H) 02/02/2022  ? ?Stable, cont oral replacement - b12 1000 mcg qd ? ?

## 2022-02-09 ENCOUNTER — Other Ambulatory Visit (INDEPENDENT_AMBULATORY_CARE_PROVIDER_SITE_OTHER): Payer: Medicare HMO

## 2022-02-09 ENCOUNTER — Encounter: Payer: Self-pay | Admitting: Internal Medicine

## 2022-02-09 DIAGNOSIS — N179 Acute kidney failure, unspecified: Secondary | ICD-10-CM | POA: Diagnosis not present

## 2022-02-09 LAB — BASIC METABOLIC PANEL
BUN: 40 mg/dL — ABNORMAL HIGH (ref 6–23)
CO2: 28 mEq/L (ref 19–32)
Calcium: 8.9 mg/dL (ref 8.4–10.5)
Chloride: 108 mEq/L (ref 96–112)
Creatinine, Ser: 1.46 mg/dL — ABNORMAL HIGH (ref 0.40–1.20)
GFR: 32.9 mL/min — ABNORMAL LOW (ref >60.00)
Glucose, Bld: 163 mg/dL — ABNORMAL HIGH (ref 70–99)
Potassium: 4 mEq/L (ref 3.5–5.1)
Sodium: 144 mEq/L (ref 135–145)

## 2022-02-11 ENCOUNTER — Telehealth: Payer: Self-pay | Admitting: Internal Medicine

## 2022-02-11 NOTE — Telephone Encounter (Signed)
Pt checking status of 02-09-2022 lab results ? ?Informed pt of provider's 02-09-2022 result notes and recommendations ? ?Pt verbalized understanding  ?

## 2022-02-13 ENCOUNTER — Other Ambulatory Visit: Payer: Self-pay | Admitting: Physician Assistant

## 2022-02-18 NOTE — Progress Notes (Signed)
? ?Office Visit Note ? ?Patient: [name]             ?Date of Birth: August 26, 1936           ?MRN: [id number]             ?PCP: Biagio Borg, MD ?Referring: Biagio Borg, MD ?Visit Date: 02/19/2022 ?Occupation: @GUAROCC @ ? ?Subjective:  ?Arthralgias  ? ?History of Present Illness: [name] is a 86 y.o. female with history of chondrocalcinosis, osteoarthritis, and DDD.  She previously could not tolerate taking colchicine and discontinued. Patient was last seen in the office on 07/01/21.  She was previously on long-term prednisone 6 mg daily.  She states that she discontinued prednisone about 1 month ago and has been experiencing increased generalized pain and stiffness.  She states that she had a fall in October and was admitted to the hospital during that time.  She states that she worked with physical therapy and has been using a rollator walker to assist with ambulation.  She continues to have difficulty performing ADLs due to her instability and increased joint pain.   ? ? ?Activities of Daily Living:  ?Patient reports morning stiffness for 4 hours.   ?Patient Reports nocturnal pain.  ?Difficulty dressing/grooming: Reports ?Difficulty climbing stairs: Reports ?Difficulty getting out of chair: Reports ?Difficulty using hands for taps, buttons, cutlery, and/or writing: Reports ? ?Review of Systems  ?Constitutional:  Negative for fatigue.  ?HENT:  Positive for mouth dryness.   ?Eyes:  Positive for dryness.  ?Respiratory:  Positive for shortness of breath.   ?Cardiovascular:  Positive for swelling in legs/feet.  ?Gastrointestinal:  Negative for diarrhea.  ?Endocrine: Positive for cold intolerance and increased urination.  ?Genitourinary:  Positive for involuntary urination.  ?Musculoskeletal:  Positive for joint pain, gait problem, joint pain, joint swelling, muscle weakness, morning stiffness and muscle tenderness.  ?Skin:  Negative for rash.  ?Allergic/Immunologic: Negative for susceptible to  infections.  ?Neurological:  Positive for numbness and weakness.  ?Hematological:  Negative for bruising/bleeding tendency.  ?Psychiatric/Behavioral:  Negative for sleep disturbance.   ? ?PMFS History:  ?Patient Active Problem List  ? Diagnosis Date Noted  ? B12 deficiency 02/08/2022  ? Primary osteoarthritis of both shoulders 02/04/2022  ? Acute metabolic encephalopathy   ? Sepsis with encephalopathy without septic shock ([place])   ? AMS (altered mental status) 07/29/2021  ? Altered mental status 07/27/2021  ? Chronic diastolic CHF (congestive heart failure) ([place]) 07/27/2021  ? Right hip pain 06/27/2021  ? Right knee pain 06/27/2021  ? Acute diastolic congestive heart failure ([place]) 06/18/2021  ? Calcium pyrophosphate deposition disease 02/12/2021  ? Fibromyalgia 02/12/2021  ? Polymyalgia rheumatica ([place]) 02/12/2021  ? Inflammatory arthritis 02/12/2021  ? Left knee pain 02/12/2021  ? Long term (current) use of systemic steroids 02/01/2021  ? Nail disorder 01/24/2021  ? Toe pain, right 01/24/2021  ? Chondrocalcinosis 12/19/2020  ? Primary osteoarthritis of both hands 12/19/2020  ? Pain in joint of left shoulder 08/22/2020  ? Pain in joint of right shoulder 08/22/2020  ? Shoulder pain 07/31/2020  ? Chronic pain 04/23/2020  ? Supraclavicular fossa fullness 04/23/2020  ? Finding of neck region 04/23/2020  ? Low grade fever 12/07/2019  ? OAB (overactive bladder) 12/07/2019  ? Pain of right heel 12/07/2019  ? Overactive bladder 12/07/2019  ? Diarrhea 04/13/2019  ? Abnormal gait 08/30/2018  ? Asthenia 08/30/2018  ? Conjunctivitis of left eye 04/13/2018  ? Rash 04/13/2018  ? Eruption at  injection site 04/13/2018  ? Acute bilateral deep vein thrombosis (DVT) of femoral veins ([place]) 01/31/2018  ? Hypokalemia 01/31/2018  ? Electrocardiogram abnormal 01/31/2018  ? Increased creatine kinase level 01/31/2018  ? Left leg pain 01/13/2018  ? Left leg swelling 01/13/2018  ? Hearing loss 01/13/2018  ? Intractable pain 12/27/2017  ?  Arachnoiditis 12/27/2017  ? Pain 12/27/2017  ? Hyponatremia   ? S/P lumbar laminectomy 12/23/2017  ? History of lumbar laminectomy 12/23/2017  ? Wheezing 12/10/2017  ? Itching 11/22/2017  ? Urinary frequency 11/22/2017  ? Increased frequency of urination 11/22/2017  ? Degeneration of lumbar intervertebral disc 11/05/2017  ? Sciatica 09/04/2017  ? Asthma exacerbation 08/31/2017  ? Acute asthma 08/31/2017  ? Flare of rheumatoid arthritis ([place]) 05/11/2017  ? Articular gout 05/01/2017  ? Vertigo 10/30/2016  ? Allergic rhinitis 10/30/2016  ? Incontinence of feces 07/31/2016  ? Hemorrhoids 07/31/2016  ? Hematochezia 07/31/2016  ? Peripheral venous insufficiency 07/10/2016  ? Diastolic dysfunction 94/49/6759  ? Peripheral edema 07/10/2016  ? Left ankle pain 06/02/2016  ? Neck pain 03/10/2016  ? Low back pain 03/10/2016  ? Right cervical radiculopathy 11/13/2015  ? Bradycardia 03/15/2015  ? Joint pain 12/18/2014  ? Fever 11/27/2014  ? Impaired glucose tolerance 07/18/2014  ? Right lumbar radiculopathy 07/18/2014  ? Left lumbar radiculopathy 07/18/2014  ? Encounter for well adult exam with abnormal findings 02/20/2014  ? Malignant neoplasm of upper-outer quadrant of female breast ([place]) 11/08/2013  ? Diverticular disease 07/09/2012  ? Headache(784.0) 06/30/2012  ? Anemia 06/29/2012  ? Dyspnea 03/11/2012  ? Cough 03/11/2012  ? CKD (chronic kidney disease) stage 4, GFR 15-29 ml/min (HCC) 05/18/2011  ? Gastroesophageal reflux disease 07/05/2008  ? Vitamin D deficiency 12/19/2007  ? Morbid obesity ([place]) 12/16/2007  ? Essential hypertension 12/16/2007  ? DEGENERATIVE JOINT DISEASE 12/16/2007  ? Osteoarthritis 12/16/2007  ?  ?Past Medical History:  ?Diagnosis Date  ? Allergic rhinitis 10/30/2016  ? Anemia   ? Asthma   ? Breast cancer of upper-outer quadrant of left female breast ([place]) 11/08/2013  ? ER/PR+ Her2- Left IDC   ? Chronic renal insufficiency   ? Chronic rhinitis   ? Colon polyp   ? Diastolic dysfunction 16/38/4665  ? DJD  (degenerative joint disease)   ? Dyspnea   ? Frozen shoulder   ? Full dentures   ? GERD (gastroesophageal reflux disease)   ? Hearing loss   ? Hypertension   ? Hyponatremia   ? Impaired glucose tolerance 07/18/2014  ? Memory loss   ? Morbid obesity ([place])   ? Poor circulation   ? Vertigo   ? Wears glasses   ?  ?Family History  ?Problem Relation Age of Onset  ? Colon cancer Mother   ?     in her 16's  ? Stomach cancer Mother   ? Lung cancer Brother   ?     was a smoker  ? Heart attack Son 62  ? Healthy Daughter   ? ?Past Surgical History:  ?Procedure Laterality Date  ? ABDOMINAL HYSTERECTOMY    ? BREAST LUMPECTOMY WITH NEEDLE LOCALIZATION AND AXILLARY SENTINEL LYMPH NODE BX Left 12/04/2013  ? Procedure: BREAST LUMPECTOMY WITH NEEDLE LOCALIZATION AND AXILLARY SENTINEL LYMPH NODE BX;  Surgeon: Shann Medal, MD;  Location: [place];  Service: General;  Laterality: Left;  ? CATARACT EXTRACTION  2009  ? rt  ? COLONOSCOPY    ? EYE SURGERY Bilateral   ? cataract surgery  ?  KNEE ARTHROSCOPY    ? both  ? LUMBAR LAMINECTOMY/DECOMPRESSION MICRODISCECTOMY Left 12/23/2017  ? Procedure: Left Lumbar One-Two Laminectomy with microdiscectomy;  Surgeon: Eustace Moore, MD;  Location: [place];  Service: Neurosurgery;  Laterality: Left;  Left L1-2 Laminectomy with microdiscectomy  ? TONSILLECTOMY    ? TOTAL KNEE ARTHROPLASTY  2002  ? rt  ? TOTAL KNEE ARTHROPLASTY  2003  ? left  ? VESICOVAGINAL FISTULA CLOSURE W/ TAH  1980  ? ?Social History  ? ?Social History Narrative  ? Not on file  ? ?Immunization History  ?Administered Date(s) Administered  ? Fluad Quad(high Dose 65+) 11/27/2020, 06/27/2021  ? Influenza Split 08/07/2011, 10/13/2012  ? Influenza Whole 07/27/2008, 08/14/2009, 07/01/2010  ? Influenza, High Dose Seasonal PF 07/10/2016, 08/10/2017, 08/30/2018, 09/08/2019  ? Influenza,inj,Quad PF,6+ Mos 08/28/2013, 07/18/2014, 07/26/2015  ? PFIZER(Purple Top)SARS-COV-2 Vaccination 12/11/2019, 01/02/2020  ? Pneumococcal  Conjugate-13 02/20/2014  ? Pneumococcal Polysaccharide-23 10/26/2005  ? Tdap 11/22/2017  ?  ? ?Objective: ?Vital Signs: BP (!) 171/75 (BP Location: Right Arm, Patient Position: Sitting, Cuff Size: Normal)   Pulse 8

## 2022-02-19 ENCOUNTER — Encounter: Payer: Self-pay | Admitting: Physician Assistant

## 2022-02-19 ENCOUNTER — Ambulatory Visit: Payer: Medicare HMO | Admitting: Physician Assistant

## 2022-02-19 VITALS — BP 171/75 | HR 83 | Resp 20 | Ht 61.0 in | Wt 159.0 lb

## 2022-02-19 DIAGNOSIS — I82413 Acute embolism and thrombosis of femoral vein, bilateral: Secondary | ICD-10-CM | POA: Diagnosis not present

## 2022-02-19 DIAGNOSIS — M19012 Primary osteoarthritis, left shoulder: Secondary | ICD-10-CM

## 2022-02-19 DIAGNOSIS — Z170 Estrogen receptor positive status [ER+]: Secondary | ICD-10-CM

## 2022-02-19 DIAGNOSIS — I10 Essential (primary) hypertension: Secondary | ICD-10-CM

## 2022-02-19 DIAGNOSIS — M1120 Other chondrocalcinosis, unspecified site: Secondary | ICD-10-CM | POA: Diagnosis not present

## 2022-02-19 DIAGNOSIS — M79671 Pain in right foot: Secondary | ICD-10-CM | POA: Diagnosis not present

## 2022-02-19 DIAGNOSIS — M79672 Pain in left foot: Secondary | ICD-10-CM

## 2022-02-19 DIAGNOSIS — Z7952 Long term (current) use of systemic steroids: Secondary | ICD-10-CM

## 2022-02-19 DIAGNOSIS — I872 Venous insufficiency (chronic) (peripheral): Secondary | ICD-10-CM

## 2022-02-19 DIAGNOSIS — M5136 Other intervertebral disc degeneration, lumbar region: Secondary | ICD-10-CM

## 2022-02-19 DIAGNOSIS — J4521 Mild intermittent asthma with (acute) exacerbation: Secondary | ICD-10-CM

## 2022-02-19 DIAGNOSIS — M791 Myalgia, unspecified site: Secondary | ICD-10-CM

## 2022-02-19 DIAGNOSIS — C50412 Malignant neoplasm of upper-outer quadrant of left female breast: Secondary | ICD-10-CM | POA: Diagnosis not present

## 2022-02-19 DIAGNOSIS — Z96653 Presence of artificial knee joint, bilateral: Secondary | ICD-10-CM | POA: Diagnosis not present

## 2022-02-19 DIAGNOSIS — M19041 Primary osteoarthritis, right hand: Secondary | ICD-10-CM

## 2022-02-19 DIAGNOSIS — M5030 Other cervical disc degeneration, unspecified cervical region: Secondary | ICD-10-CM | POA: Diagnosis not present

## 2022-02-19 DIAGNOSIS — Z8719 Personal history of other diseases of the digestive system: Secondary | ICD-10-CM

## 2022-02-19 DIAGNOSIS — M19011 Primary osteoarthritis, right shoulder: Secondary | ICD-10-CM | POA: Diagnosis not present

## 2022-02-19 DIAGNOSIS — N183 Chronic kidney disease, stage 3 unspecified: Secondary | ICD-10-CM

## 2022-02-19 DIAGNOSIS — M19042 Primary osteoarthritis, left hand: Secondary | ICD-10-CM

## 2022-02-19 DIAGNOSIS — K5790 Diverticulosis of intestine, part unspecified, without perforation or abscess without bleeding: Secondary | ICD-10-CM

## 2022-02-19 MED ORDER — PREDNISONE 5 MG PO TABS: ORAL_TABLET | ORAL | 0 refills | Status: DC

## 2022-02-23 DIAGNOSIS — I5022 Chronic systolic (congestive) heart failure: Secondary | ICD-10-CM | POA: Diagnosis not present

## 2022-02-24 ENCOUNTER — Encounter: Payer: Medicare HMO | Attending: Physical Medicine and Rehabilitation | Admitting: Physical Medicine and Rehabilitation

## 2022-03-09 DIAGNOSIS — N184 Chronic kidney disease, stage 4 (severe): Secondary | ICD-10-CM | POA: Diagnosis not present

## 2022-03-18 DIAGNOSIS — E871 Hypo-osmolality and hyponatremia: Secondary | ICD-10-CM | POA: Diagnosis not present

## 2022-03-18 DIAGNOSIS — R600 Localized edema: Secondary | ICD-10-CM | POA: Diagnosis not present

## 2022-03-18 DIAGNOSIS — R809 Proteinuria, unspecified: Secondary | ICD-10-CM | POA: Diagnosis not present

## 2022-03-18 DIAGNOSIS — N281 Cyst of kidney, acquired: Secondary | ICD-10-CM | POA: Diagnosis not present

## 2022-03-18 DIAGNOSIS — N1832 Chronic kidney disease, stage 3b: Secondary | ICD-10-CM | POA: Diagnosis not present

## 2022-03-18 DIAGNOSIS — R3129 Other microscopic hematuria: Secondary | ICD-10-CM | POA: Diagnosis not present

## 2022-03-18 DIAGNOSIS — R7303 Prediabetes: Secondary | ICD-10-CM | POA: Diagnosis not present

## 2022-03-18 DIAGNOSIS — I129 Hypertensive chronic kidney disease with stage 1 through stage 4 chronic kidney disease, or unspecified chronic kidney disease: Secondary | ICD-10-CM | POA: Diagnosis not present

## 2022-03-20 ENCOUNTER — Other Ambulatory Visit: Payer: Self-pay | Admitting: Physician Assistant

## 2022-03-23 ENCOUNTER — Other Ambulatory Visit: Payer: Self-pay | Admitting: Physician Assistant

## 2022-03-26 DIAGNOSIS — Z6828 Body mass index (BMI) 28.0-28.9, adult: Secondary | ICD-10-CM | POA: Diagnosis not present

## 2022-03-26 DIAGNOSIS — I5022 Chronic systolic (congestive) heart failure: Secondary | ICD-10-CM | POA: Diagnosis not present

## 2022-03-26 DIAGNOSIS — M48061 Spinal stenosis, lumbar region without neurogenic claudication: Secondary | ICD-10-CM | POA: Diagnosis not present

## 2022-03-27 ENCOUNTER — Telehealth: Payer: Self-pay | Admitting: Internal Medicine

## 2022-03-27 DIAGNOSIS — G8929 Other chronic pain: Secondary | ICD-10-CM

## 2022-03-27 NOTE — Telephone Encounter (Signed)
Patient notified

## 2022-03-27 NOTE — Progress Notes (Signed)
I, Wendy Poet, LAT, ATC, am serving as scribe for Dr. Lynne Leader.  [name] is a 86 y.o. female who presents to [place] at [facility] today for f/u of B shoulder pain due to severe DJD.  She was last seen by Dr. Georgina Snell on 02/04/22 and had B GHJ steroid injections.  She has tried both OT and home health therapy in the past.  Today, pt reports not much change or improvement in her bilat shoulder pain.   Pt also c/o pain along the posterior aspect of bilat thighs ongoing for the past week. Pt locates pain to bilat buttock w/ radiating pain along the posterior aspect of both thighs to popliteal fossa.  She is a little confused today of the point of the visit.  She thought she was coming here today to discuss overall pain management.  She has chronic polyarthralgia pain that she previously was being treated for by her rheumatologist with chronic steroids.  According to her most recent note from rheumatology she is thought to not have a actual rheumatologic issue and she was slowly tapered off of steroids and advised to follow-up with pain management for management of her chronic pain.  Low back pain: No Radiates: yes LE numbness/tingling: no LE weakness: yes Aggravates: nothing in particular Treatments tried: none  Diagnostic testing: R and L shoulder XR- 02/04/22, 08/20/20  Pertinent review of systems: No fevers or chills  Relevant historical information: CHF.  History of DVT.  CKD 4.   Exam:  BP 116/72   Pulse 87   Ht '5\' 1"'$  (1.549 m)   Wt 161 lb (73 kg)   SpO2 96%   BMI 30.42 kg/m  General: Well Developed, well nourished, and in no acute distress.   MSK: Shoulders decreased motion bilaterally. L-spine: Nontender decreased lumbar motion. Uses a walker to ambulate.    Lab and Radiology Results  EXAM: RIGHT SHOULDER - 2+ VIEW   COMPARISON:  08/20/2020   FINDINGS: No fracture or dislocation. Severe arthritis of the glenohumeral joint with  bone on bone appearance and subarticular sclerosis and irregularity. High-riding humeral head consistent with rotator cuff disease. Moderate AC joint degenerative change   IMPRESSION: 1. Progressive severe arthritis of the glenohumeral joint 2. High-riding humeral head consistent with rotator cuff disease 3. Moderate AC joint degenerative change     Electronically Signed   By: Donavan Foil M.D.   On: 02/04/2022 20:46  EXAM: LEFT SHOULDER - 2+ VIEW   COMPARISON:  08/20/2020   FINDINGS: No fracture or malalignment. Severe arthritis of the glenohumeral joint with bone on bone appearance and sclerosis, progressed compared to prior. Mild AC joint degenerative change   IMPRESSION: Progressive severe arthritis of the left shoulder     Electronically Signed   By: Donavan Foil M.D.   On: 02/04/2022 20:45      EXAM: MRI LUMBAR SPINE WITHOUT CONTRAST   TECHNIQUE: Multiplanar, multisequence MR imaging of the lumbar spine was performed. No intravenous contrast was administered.   COMPARISON:  MRI lumbar spine 12/27/2017   FINDINGS: Segmentation:  Standard   Alignment:  Unchanged levoconvex curvature.   Vertebrae:  No fracture, evidence of discitis, or bone lesion.   Conus medullaris and cauda equina: Conus extends to the L1 level. Conus and cauda equina appear unremarkable, no longer clustered.   Paraspinal and other soft tissues: Paraspinal muscle atrophy.   Disc levels:   T11-T12: Left paracentral disc protrusion. Mild left lateral recess narrowing and  neural foraminal narrowing.   T12-L1: Broad-based disc bulging, ligamentum flavum hypertrophy and bilateral facet arthropathy. Mild spinal canal stenosis, mild right and moderate left neural foraminal narrowing.   L1-L2: Large central disc extrusion results in severe spinal canal stenosis. Severe right and mild left neural foraminal narrowing with contributory facet arthropathy bilaterally.   L2-L3: Trace  anterolisthesis with asymmetric right disc bulging and endplate spurring and bilateral facet arthropathy results in severe spinal canal stenosis, severe right and no significant left neural foraminal narrowing.   L3-L4: Trace anterolisthesis with endplate spurring and asymmetric right disc bulging, bilateral facet arthropathy and ligamentum flavum thickening results in severe spinal canal stenosis, moderate left and severe right neural foraminal narrowing.   L4-L5: Severe disc height loss with broad-based disc bulging, ligamentum flavum hypertrophy, and bilateral facet arthropathy results in mild spinal canal stenosis, severe left and no significant right neural foraminal narrowing.   L5-S1: Severe disc height loss with broad-based disc bulging, ligamentum flavum hypertrophy and bilateral facet arthropathy results in mild spinal canal stenosis, severe right and moderate-severe left neural foraminal narrowing.   IMPRESSION: Large central disc extrusion at L1-L2 results in severe spinal canal stenosis, severe right and mild left neural foraminal narrowing with contributory facet arthropathy bilaterally.   Additional advanced degenerative changes of the lumbar spine with severe spinal canal stenosis and right-sided neural foraminal narrowing at L2-L3 and L3-L4. Moderate left neural foraminal narrowing at L3-L4.   Severe left-sided neural foraminal narrowing and mild spinal canal stenosis at L4-L5.   Severe right and moderate-severe left neural foraminal narrowing at L5-S1 with mild spinal canal stenosis at this level.     Electronically Signed   By: Maurine Simmering M.D.   On: 10/11/2021 14:16     I, Lynne Leader, personally (independently) visualized and performed the interpretation of the images attached in this note.      Assessment and Plan: 86 y.o. female with chronic shoulder and low back pain thought to be due to degenerative changes.  Unfortunately failing conservative  management including history of bilateral shoulder injections recently and history of spinal injections.  She has been weaned off of her systemic steroids and is experiencing overall worsening pain.  I do think it makes sense for her to be referred to pain management to discuss other options.  Based on her medical comorbidities NSAIDs are not an option and Tylenol has not been effective.  Opiates probably are a reasonable choice.  Refer to [facility] pain management.  I do not have much more specific treatment offer for her.  We could proceed for nerve ablations in her shoulders and more injections in her back but I am not optimistic about them. Total encounter time 30 minutes including face-to-face time with the patient and, reviewing past medical record, and charting on the date of service.   Treatment plan options.  Of note I will attach the assessment and plan from her most recent rheumatology note to the bottom of this note.   PDMP not reviewed this encounter. Orders Placed This Encounter  Procedures   Ambulatory referral to Pain Clinic    Referral Priority:   Routine    Referral Type:   Consultation    Referral Reason:   Specialty Services Required    Requested Specialty:   Pain Medicine    Number of Visits Requested:   1   No orders of the defined types were placed in this encounter.    Discussed warning signs or symptoms. Please see discharge  instructions. Patient expresses understanding.   The above documentation has been reviewed and is accurate and complete Lynne Leader, M.D.     Appendix: Assessment and plan from rheumatology visit dated February 19, 2022 Assessment / Plan:     Visit Diagnoses: Chondrocalcinosis: She is not exhibiting any signs or symptoms of active disease at this time.  She previously tried colchicine but discontinued due to GI side effects.  She was previously on long-term prednisone 6 mg daily but has been off of prednisone for the past 1 month.  She has  been experiencing increased arthralgias and joint stiffness which have been affecting her ADLs as well as her mobility.  No inflammation was noted on examination today.  She was encouraged to follow-up with her pain management specialist going forward.  Reviewed the patient's case with Dr. Dimas Alexandria today in the office.  Dr. Estanislado Pandy discussed the risks of long-term prednisone use with the patient today. A short low-dose prednisone taper starting at 10 mg tapering by 2.5 mg every 4 days was sent to the pharmacy today.  She will follow-up in our office as needed.   Primary osteoarthritis of both hands - The clinical and radiographic findings are consistent with osteoarthritis.  She has PIP and DIP thickening consistent with osteoarthritis of both hands.  No tenderness or inflammation was noted on examination today.  Discussed the importance of joint protection and muscle strengthening.   Primary osteoarthritis of both shoulders: Chronic pain.  Limited range of motion of both shoulder joints especially with internal rotation.  Some tenderness palpation over both shoulders noted.  A low-dose short prednisone taper was sent to the pharmacy today as discussed above.  She was encouraged to follow-up with pain management.   Status post total knee replacement, bilateral: Doing well.  She has good range of motion of both knee replacements with no discomfort at this time.  She has been using a rollator walker to assist with ambulation.  Discussed the importance of lower extremity muscle strengthening and fall prevention.   Pain in both feet - With subtalar collapse consistent with inflammatory arthritis, most likely due to chronic chondrocalcinosis.  She is not experiencing any increased discomfort in her ankle joints at this time.  No tenderness or synovitis was noted.  Pedal edema noted bilaterally.   Long term (current) use of systemic steroids - She was previously on long-term prednisone 6 mg daily.  She  tapered off of prednisone 1 month ago due to running out of the prescription.  She has been experiencing increased arthralgias and joint stiffness since then.  Discussed the risks of long-term prednisone use.  We strongly discouraged the use of long-term prednisone due to possible side effects and long-term adverse effects. The patient is followed by pain management and was strongly encouraged to schedule a follow-up visit if she continues to have persistent discomfort.  A short low-dose prednisone taper was provided to the patient today to treat her current flare.   DDD (degenerative disc disease), cervical: She has limited ROM of the C-spine with lateral rotation.  No symptoms of radiculopathy at this time.  Trapezius muscle tension and tenderness noted.    Degenerative disc disease, lumbar - Status post laminectomy.  MRI of lumbar spine on 10/11/2021 was reviewed today in the office.  She has severe discomfort in her lower back.  She has been using a rollator walker to assist with ambulation.  She has limited mobility due to discomfort and stiffness in her lower back.  She had a recent injection which has improved her discomfort significantly.  She was encouraged to continue to follow-up with pain management.   Myalgia: She continues to experience intermittent myalgias and arthralgias.   Other medical conditions are listed as follows:    Stage 3 chronic kidney disease, unspecified whether stage 3a or 3b CKD ([place])   Malignant neoplasm of upper-outer quadrant of left breast in female, estrogen receptor positive ([place])   Acute bilateral deep vein thrombosis (DVT) of femoral veins (HCC)   Chronic venous insufficiency   Mild intermittent asthmatic bronchitis with acute exacerbation   Diverticulosis   History of gastroesophageal reflux (GERD)   Essential hypertension

## 2022-03-27 NOTE — Telephone Encounter (Signed)
Pt called wanting to know if we are referring them out to any pain management, says they talked about it during a previous visit in April.   Pt states they are in high levels of discomfort and would like to know when they can start with a pain management center.  Please advise.

## 2022-03-27 NOTE — Telephone Encounter (Signed)
Ok this is done, hopefully they will hear soon

## 2022-03-30 ENCOUNTER — Ambulatory Visit (INDEPENDENT_AMBULATORY_CARE_PROVIDER_SITE_OTHER): Payer: Medicare HMO | Admitting: Family Medicine

## 2022-03-30 VITALS — BP 116/72 | HR 87 | Ht 61.0 in | Wt 161.0 lb

## 2022-03-30 DIAGNOSIS — M19012 Primary osteoarthritis, left shoulder: Secondary | ICD-10-CM | POA: Diagnosis not present

## 2022-03-30 DIAGNOSIS — M19011 Primary osteoarthritis, right shoulder: Secondary | ICD-10-CM

## 2022-03-30 DIAGNOSIS — M5387 Other specified dorsopathies, lumbosacral region: Secondary | ICD-10-CM | POA: Diagnosis not present

## 2022-03-30 DIAGNOSIS — G894 Chronic pain syndrome: Secondary | ICD-10-CM | POA: Diagnosis not present

## 2022-03-30 NOTE — Patient Instructions (Addendum)
Thank you for coming in today.   I've referred you to [facility] for pain management  Check back as needed

## 2022-04-11 DIAGNOSIS — Z79899 Other long term (current) drug therapy: Secondary | ICD-10-CM | POA: Diagnosis not present

## 2022-04-11 DIAGNOSIS — M19012 Primary osteoarthritis, left shoulder: Secondary | ICD-10-CM | POA: Diagnosis not present

## 2022-04-11 DIAGNOSIS — Z0130 Encounter for examination of blood pressure without abnormal findings: Secondary | ICD-10-CM | POA: Diagnosis not present

## 2022-04-11 DIAGNOSIS — I10 Essential (primary) hypertension: Secondary | ICD-10-CM | POA: Diagnosis not present

## 2022-04-11 DIAGNOSIS — M129 Arthropathy, unspecified: Secondary | ICD-10-CM | POA: Diagnosis not present

## 2022-04-11 DIAGNOSIS — G8929 Other chronic pain: Secondary | ICD-10-CM | POA: Diagnosis not present

## 2022-04-11 DIAGNOSIS — M19011 Primary osteoarthritis, right shoulder: Secondary | ICD-10-CM | POA: Diagnosis not present

## 2022-04-11 DIAGNOSIS — K219 Gastro-esophageal reflux disease without esophagitis: Secondary | ICD-10-CM | POA: Diagnosis not present

## 2022-04-11 DIAGNOSIS — Z6830 Body mass index (BMI) 30.0-30.9, adult: Secondary | ICD-10-CM | POA: Diagnosis not present

## 2022-04-11 DIAGNOSIS — M549 Dorsalgia, unspecified: Secondary | ICD-10-CM | POA: Diagnosis not present

## 2022-04-11 DIAGNOSIS — S39012A Strain of muscle, fascia and tendon of lower back, initial encounter: Secondary | ICD-10-CM | POA: Diagnosis not present

## 2022-04-11 DIAGNOSIS — R030 Elevated blood-pressure reading, without diagnosis of hypertension: Secondary | ICD-10-CM | POA: Diagnosis not present

## 2022-04-17 DIAGNOSIS — R030 Elevated blood-pressure reading, without diagnosis of hypertension: Secondary | ICD-10-CM | POA: Diagnosis not present

## 2022-04-17 DIAGNOSIS — I10 Essential (primary) hypertension: Secondary | ICD-10-CM | POA: Diagnosis not present

## 2022-04-17 DIAGNOSIS — Z79899 Other long term (current) drug therapy: Secondary | ICD-10-CM | POA: Diagnosis not present

## 2022-04-17 DIAGNOSIS — I129 Hypertensive chronic kidney disease with stage 1 through stage 4 chronic kidney disease, or unspecified chronic kidney disease: Secondary | ICD-10-CM | POA: Diagnosis not present

## 2022-04-17 DIAGNOSIS — Z131 Encounter for screening for diabetes mellitus: Secondary | ICD-10-CM | POA: Diagnosis not present

## 2022-04-17 DIAGNOSIS — N184 Chronic kidney disease, stage 4 (severe): Secondary | ICD-10-CM | POA: Diagnosis not present

## 2022-04-23 ENCOUNTER — Encounter: Payer: Medicare HMO | Attending: Physical Medicine and Rehabilitation | Admitting: Physical Medicine and Rehabilitation

## 2022-04-23 VITALS — BP 97/59 | HR 86 | Ht 61.0 in | Wt 159.0 lb

## 2022-04-23 DIAGNOSIS — M353 Polymyalgia rheumatica: Secondary | ICD-10-CM | POA: Diagnosis not present

## 2022-04-23 DIAGNOSIS — M19012 Primary osteoarthritis, left shoulder: Secondary | ICD-10-CM | POA: Diagnosis not present

## 2022-04-23 DIAGNOSIS — M19011 Primary osteoarthritis, right shoulder: Secondary | ICD-10-CM | POA: Diagnosis not present

## 2022-04-23 MED ORDER — PREDNISONE 1 MG PO TABS: 1.0000 mg | ORAL_TABLET | Freq: Every day | ORAL | 3 refills | Status: DC | PRN

## 2022-04-23 NOTE — Progress Notes (Signed)
Subjective:    Patient ID: [name], female    DOB: May 22, 1936, 86 y.o.   MRN: [id number]  HPI   [name] is an 86 year old woman who presents for follow-up of pain in her bilateral shoulders.   She had no benefit from the corticosteroid shots. She felt sick afterward. Her daughter has to bring her to her appointments so she was not able to come in earlier. She is taking oral prednisone- she started yesterday. Has not yet been able to feel any benefit. She currently feels pain in both shoulders and thighs.   She prefers less medications. She is willing to try gel and patches and anti-inflammatory foods.   She is having difficulty raising her hands and thus performing ADLs due to her pain. She does have excellent family support for which she is very thankful.    Her daughter calls in today.   Her doctor stopped the prednisone because of her kidneys. The prednisone really helped. Her daughter would prefer using the steroids as needed rather than trying something else   Pain Inventory Average Pain 7 Pain Right Now 7 My pain is intermittent, sharp, and stabbing  In the last 24 hours, has pain interfered with the following? General activity 7 Relation with others 7 Enjoyment of life 7 What TIME of day is your pain at its worst? morning  Sleep (in general) Good  Pain is worse with: walking, bending, and standing Pain improves with: heat/ice and medication Relief from Meds: 2  Family History  Problem Relation Age of Onset   Colon cancer Mother        in her 21's   Stomach cancer Mother    Lung cancer Brother        was a smoker   Heart attack Son 56   Healthy Daughter    Social History   Socioeconomic History   Marital status: Widowed    Spouse name: Not on file   Number of children: 2   Years of education: Not on file   Highest education level: Not on file  Occupational History   Occupation: owns Teacher, adult education and works PT for [employer]  Tobacco Use    Smoking status: Never   Smokeless tobacco: Never  Vaping Use   Vaping Use: Never used  Substance and Sexual Activity   Alcohol use: No    Alcohol/week: 0.0 standard drinks of alcohol   Drug use: No   Sexual activity: Not Currently  Other Topics Concern   Not on file  Social History Narrative   Not on file   Social Determinants of Health   Financial Resource Strain: [place]  (05/20/2021)   Overall Financial Resource Strain (CARDIA)    Difficulty of Paying Living Expenses: Not hard at all  Food Insecurity: No Food Insecurity (05/20/2021)   Hunger Vital Sign    Worried About Running Out of Food in the Last Year: Never true    [place] in the Last Year: Never true  Transportation Needs: No Transportation Needs (05/20/2021)   PRAPARE - Hydrologist (Medical): No    Lack of Transportation (Non-Medical): No  Physical Activity: Inactive (05/20/2021)   Exercise Vital Sign    Days of Exercise per Week: 0 days    Minutes of Exercise per Session: 0 min  Stress: No Stress Concern Present (05/20/2021)   [employer]  of Stress : Not at all  Social Connections: Moderately Integrated (05/20/2021)   Social Connection and Isolation Panel [NHANES]    Frequency of Communication with Friends and Family: More than three times a week    Frequency of Social Gatherings with Friends and Family: More than three times a week    Attends Religious Services: 1 to 4 times per year    Active Member of [employer] or Organizations: Yes    Attends Archivist Meetings: 1 to 4 times per year    Marital Status: Widowed   Past Surgical History:  Procedure Laterality Date   ABDOMINAL HYSTERECTOMY     BREAST LUMPECTOMY WITH NEEDLE LOCALIZATION AND AXILLARY SENTINEL LYMPH NODE BX Left 12/04/2013   Procedure: BREAST LUMPECTOMY WITH NEEDLE LOCALIZATION AND AXILLARY SENTINEL LYMPH NODE BX;  Surgeon: Shann Medal, MD;  Location: [place];  Service: General;  Laterality: Left;   CATARACT EXTRACTION  2009   rt   COLONOSCOPY     EYE SURGERY Bilateral    cataract surgery   KNEE ARTHROSCOPY     both   LUMBAR LAMINECTOMY/DECOMPRESSION MICRODISCECTOMY Left 12/23/2017   Procedure: Left Lumbar One-Two Laminectomy with microdiscectomy;  Surgeon: Eustace Moore, MD;  Location: [place];  Service: Neurosurgery;  Laterality: Left;  Left L1-2 Laminectomy with microdiscectomy   TONSILLECTOMY     TOTAL KNEE ARTHROPLASTY  2002   rt   TOTAL KNEE ARTHROPLASTY  2003   left   VESICOVAGINAL FISTULA CLOSURE W/ TAH  1980   Past Surgical History:  Procedure Laterality Date   ABDOMINAL HYSTERECTOMY     BREAST LUMPECTOMY WITH NEEDLE LOCALIZATION AND AXILLARY SENTINEL LYMPH NODE BX Left 12/04/2013   Procedure: BREAST LUMPECTOMY WITH NEEDLE LOCALIZATION AND AXILLARY SENTINEL LYMPH NODE BX;  Surgeon: Shann Medal, MD;  Location: [place];  Service: General;  Laterality: Left;   CATARACT EXTRACTION  2009   rt   COLONOSCOPY     EYE SURGERY Bilateral    cataract surgery   KNEE ARTHROSCOPY     both   LUMBAR LAMINECTOMY/DECOMPRESSION MICRODISCECTOMY Left 12/23/2017   Procedure: Left Lumbar One-Two Laminectomy with microdiscectomy;  Surgeon: Eustace Moore, MD;  Location: [facility] [place];  Service: Neurosurgery;  Laterality: Left;  Left L1-2 Laminectomy with microdiscectomy   TONSILLECTOMY     TOTAL KNEE ARTHROPLASTY  2002   rt   TOTAL KNEE ARTHROPLASTY  2003   left   VESICOVAGINAL FISTULA CLOSURE W/ TAH  1980   Past Medical History:  Diagnosis Date   Allergic rhinitis 10/30/2016   Anemia    Asthma    Breast cancer of upper-outer quadrant of left female breast ([place]) 11/08/2013   ER/PR+ Her2- Left IDC    Chronic renal insufficiency    Chronic rhinitis    Colon polyp    Diastolic dysfunction 41/74/0814   DJD (degenerative joint disease)    Dyspnea    Frozen shoulder    Full dentures     GERD (gastroesophageal reflux disease)    Hearing loss    Hypertension    Hyponatremia    Impaired glucose tolerance 07/18/2014   Memory loss    Morbid obesity (HCC)    Poor circulation    Vertigo    Wears glasses    BP (!) 97/59   Pulse 86   Ht _0  (1.549 m)   Wt 159 lb (72.1 kg)   SpO2 91%   BMI 30.04 kg/m   Opioid  Risk Score:   Fall Risk Score:  `1  Depression screen [facility] 2/9     02/02/2022    9:47 AM 05/20/2021    9:42 AM 01/24/2021    3:53 PM 01/24/2021    3:33 PM 07/31/2020    4:43 PM 07/17/2020    9:49 AM 06/13/2020   11:22 AM  Depression screen PHQ 2/9  Decreased Interest 0 0 0 0 0 0 0  Down, Depressed, Hopeless 0 0 0 0 0 0 0  PHQ - 2 Score 0 0 0 0 0 0 0  Altered sleeping       0  Tired, decreased energy       2  Change in appetite       0  Feeling bad or failure about yourself        0  Trouble concentrating       0  Moving slowly or fidgety/restless       3  Suicidal thoughts       0  PHQ-9 Score       5  Difficult doing work/chores       Somewhat difficult   Review of Systems  Constitutional: Negative.   HENT: Negative.    Eyes: Negative.   Respiratory: Negative.    Cardiovascular: Negative.   Gastrointestinal: Negative.   Endocrine: Negative.   Genitourinary: Negative.   Musculoskeletal:  Positive for arthralgias, back pain and gait problem.  Skin: Negative.   Allergic/Immunologic: Negative.   Hematological: Negative.   Psychiatric/Behavioral: Negative.         Objective:   Physical Exam Gen: no distress, normal appearing, BMI 30.04, 97/59 HEENT: oral mucosa pink and moist, NCAT Cardio: Reg rate Chest: normal effort, normal rate of breathing Abd: soft, non-distended Ext: no edema Skin: intact Neuro:Alert and oriented x3 Musculoskeletal: Severely limited range of motion in bilateral arms- only about 30 degrees in bilateral elevation.  Psych: pleasant, normal affect    Assessment & Plan:   1) Chronic bilateral adhesive  capsulitis: Tylenol 681m morning, lunch, dinner Apply diclofenac gel up to 4 times per day (alternatively can try CBD oil or blue emu oil)  Lidocaine patch on both shoulders.  Vitamin D level checked and was normal in 2020  2) Hypomagnesemia  -Magnesium level checked from 2019 and was low at 1.3: will order supplement to take at night  3) Family support: Has wonderful family support. Discussed how touched she is by her children's care.   4) Incontinent stool/constipation: Magnesium will also help to regulate this. Discussed goal of 1-2 BM per day.   5) Polymyalgia rheumatica -prescribed prednisone 169mdaily prn. Can take up to 5 per day.  -continue CBD oil -Discussed current symptoms of pain and history of pain.  -Discussed benefits of exercise in reducing pain. -Discussed following foods that may reduce pain: 1) Ginger (especially studied for arthritis)- reduce leukotriene production to decrease inflammation 2) Blueberries- high in phytonutrients that decrease inflammation 3) Salmon- marine omega-3s reduce joint swelling and pain 4) Pumpkin seeds- reduce inflammation 5) dark chocolate- reduces inflammation 6) turmeric- reduces inflammation 7) tart cherries - reduce pain and stiffness 8) extra virgin olive oil - its compound olecanthal helps to block prostaglandins  9) chili peppers- can be eaten or applied topically via capsaicin 10) mint- helpful for headache, muscle aches, joint pain, and itching 11) garlic- reduces inflammation  Link to further information on diet for chronic pain: [email or web address] Turmeric to reduce inflammation--can be used in  cooking or taken as a supplement.  Benefits of turmeric:  -Highly anti-inflammatory  -Increases antioxidants  -Improves memory, attention, brain disease  -Lowers risk of heart disease  -May help prevent cancer  -Decreases pain  -Alleviates  depression  -Delays aging and decreases risk of chronic disease  -Consume with black pepper to increase absorption    Turmeric Milk Recipe:  1 cup milk  1 tsp turmeric  1 tsp cinnamon  1 tsp grated ginger (optional)  Black pepper (boosts the anti-inflammatory properties of turmeric).  1 tsp honey   6) CKD stage 4: -recommending staying well hydrated.   7) Osteoporosis? -discussed benefits of weight bearing exercise -recommend yogurt and figs.

## 2022-04-23 NOTE — Patient Instructions (Signed)
Turmeric to reduce inflammation--can be used in cooking or taken as a supplement.  Benefits of turmeric:  -Highly anti-inflammatory  -Increases antioxidants  -Improves memory, attention, brain disease  -Lowers risk of heart disease  -May help prevent cancer  -Decreases pain  -Alleviates depression  -Delays aging and decreases risk of chronic disease  -Consume with black pepper to increase absorption    Turmeric Milk Recipe:  1 cup milk  1 tsp turmeric  1 tsp cinnamon  1 tsp grated ginger (optional)  Black pepper (boosts the anti-inflammatory properties of turmeric).  1 tsp honey    Foods that may reduce pain: 1) Ginger (especially studied for arthritis)- reduce leukotriene production to decrease inflammation 2) Blueberries- high in phytonutrients that decrease inflammation 3) Salmon- marine omega-3s reduce joint swelling and pain 4) Pumpkin seeds- reduce inflammation 5) dark chocolate- reduces inflammation 6) turmeric- reduces inflammation 7) tart cherries - reduce pain and stiffness 8) extra virgin olive oil - its compound olecanthal helps to block prostaglandins  9) chili peppers- can be eaten or applied topically via capsaicin 10) mint- helpful for headache, muscle aches, joint pain, and itching 11) garlic- reduces inflammation  Link to further information on diet for chronic pain: [email or web address].randall.[email or web address]

## 2022-04-25 DIAGNOSIS — I5022 Chronic systolic (congestive) heart failure: Secondary | ICD-10-CM | POA: Diagnosis not present

## 2022-04-30 ENCOUNTER — Encounter: Payer: Self-pay | Admitting: Internal Medicine

## 2022-04-30 ENCOUNTER — Ambulatory Visit (INDEPENDENT_AMBULATORY_CARE_PROVIDER_SITE_OTHER): Payer: Medicare HMO | Admitting: Internal Medicine

## 2022-04-30 VITALS — BP 142/64 | HR 80 | Temp 98.7°F | Ht 61.0 in | Wt 156.5 lb

## 2022-04-30 DIAGNOSIS — N184 Chronic kidney disease, stage 4 (severe): Secondary | ICD-10-CM

## 2022-04-30 DIAGNOSIS — E559 Vitamin D deficiency, unspecified: Secondary | ICD-10-CM | POA: Diagnosis not present

## 2022-04-30 DIAGNOSIS — J309 Allergic rhinitis, unspecified: Secondary | ICD-10-CM

## 2022-04-30 DIAGNOSIS — R400 Somnolence: Secondary | ICD-10-CM

## 2022-04-30 MED ORDER — PREDNISONE 10 MG PO TABS: ORAL_TABLET | ORAL | 0 refills | Status: DC

## 2022-04-30 NOTE — Progress Notes (Signed)
Patient ID: [name], female   DOB: Dec 15, 1935, 86 y.o.   MRN: [id number]        Chief Complaint: follow up with niece this time as granddaughter ill; with allergies, snoring and daytime somnolence,        HPI:  [name] is a 86 y.o. female here with c/o 2 wks increased snoring with sleeping at any time but more sleeping during the day with fatigue and daytime somnolence, as well as Does have several wks ongoing nasal allergy symptoms with clearish congestion, itch and sneezing, without fever, pain, ST, cough, swelling or wheezing.  Pt denies chest pain, increased sob or doe, wheezing, orthopnea, PND, increased LE swelling, palpitations, dizziness or syncope.   Pt denies polydipsia, polyuria, or new focal neuro s/s.    Pt denies fever, night sweats, loss of appetite, or other constitutional symptoms, though has lost several lbs with starting farxiga 5 mg per renal recently.         Wt Readings from Last 3 Encounters:  04/30/22 156 lb 8 oz (71 kg)  04/23/22 159 lb (72.1 kg)  03/30/22 161 lb (73 kg)   BP Readings from Last 3 Encounters:  04/30/22 (!) 142/64  04/23/22 (!) 97/59  03/30/22 116/72         Past Medical History:  Diagnosis Date   Allergic rhinitis 10/30/2016   Anemia    Asthma    Breast cancer of upper-outer quadrant of left female breast ([place]) 11/08/2013   ER/PR+ Her2- Left IDC    Chronic renal insufficiency    Chronic rhinitis    Colon polyp    Diastolic dysfunction 09/73/5329   DJD (degenerative joint disease)    Dyspnea    Frozen shoulder    Full dentures    GERD (gastroesophageal reflux disease)    Hearing loss    Hypertension    Hyponatremia    Impaired glucose tolerance 07/18/2014   Memory loss    Morbid obesity ([place])    Poor circulation    Vertigo    Wears glasses    Past Surgical History:  Procedure Laterality Date   ABDOMINAL HYSTERECTOMY     BREAST LUMPECTOMY WITH NEEDLE LOCALIZATION AND AXILLARY SENTINEL LYMPH NODE BX Left  12/04/2013   Procedure: BREAST LUMPECTOMY WITH NEEDLE LOCALIZATION AND AXILLARY SENTINEL LYMPH NODE BX;  Surgeon: Shann Medal, MD;  Location: [place];  Service: General;  Laterality: Left;   CATARACT EXTRACTION  2009   rt   COLONOSCOPY     EYE SURGERY Bilateral    cataract surgery   KNEE ARTHROSCOPY     both   LUMBAR LAMINECTOMY/DECOMPRESSION MICRODISCECTOMY Left 12/23/2017   Procedure: Left Lumbar One-Two Laminectomy with microdiscectomy;  Surgeon: Eustace Moore, MD;  Location: [facility];  Service: Neurosurgery;  Laterality: Left;  Left L1-2 Laminectomy with microdiscectomy   TONSILLECTOMY     TOTAL KNEE ARTHROPLASTY  2002   rt   TOTAL KNEE ARTHROPLASTY  2003   left   VESICOVAGINAL FISTULA CLOSURE W/ TAH  1980    reports that she has never smoked. She has never used smokeless tobacco. She reports that she does not drink alcohol and does not use drugs. family history includes Colon cancer in her mother; Healthy in her daughter; Heart attack (age of onset: 73) in her son; Lung cancer in her brother; Stomach cancer in her mother. Allergies  Allergen Reactions   Hydrocodone Itching   Lasix [Furosemide] Other (See Comments)  Dizziness.    Tizanidine Other (See Comments)    Dizzy and fall   Iron Hives and Other (See Comments)     bad constipation    Current Outpatient Medications on File Prior to Visit  Medication Sig Dispense Refill   albuterol (VENTOLIN HFA) 108 (90 Base) MCG/ACT inhaler Inhale 1 puff into the lungs every 6 (six) hours as needed for wheezing or shortness of breath.     allopurinol (ZYLOPRIM) 100 MG tablet Take 100 mg by mouth daily.     amLODipine (NORVASC) 5 MG tablet TAKE 1 TABLET BY MOUTH EVERY DAY (Patient taking differently: Take 5 mg by mouth every morning.) 90 tablet 3   aspirin EC 81 MG tablet Take 81 mg by mouth every morning. Swallow whole.     Cholecalciferol (VITAMIN D3) 20 MCG (800 UNIT) TABS Take 1 tablet by mouth daily.      diclofenac Sodium (VOLTAREN) 1 % GEL APPLY 2 GRAMS TO AFFECTED AREA 4 TIMES A DAY (Patient taking differently: Apply 1 application  topically 4 (four) times daily as needed (pain).) 100 g 5   Ensure (ENSURE) Take 1 Can by mouth 3 (three) times daily between meals. (Patient taking differently: Take 237 mLs by mouth See admin instructions. Drink one can (237 mls) by mouth once or twice daily) 237 mL 12   FARXIGA 5 MG TABS tablet Take 5 mg by mouth daily.     fluticasone-salmeterol (ADVAIR DISKUS) 250-50 MCG/ACT AEPB Inhale 1 puff into the lungs in the morning and at bedtime. 180 each 3   furosemide (LASIX) 40 MG tablet Take 1 tablet (40 mg total) by mouth every Monday, Wednesday, and Friday.     gabapentin (NEURONTIN) 300 MG capsule TAKE 1 CAPSULE BY MOUTH THREE TIMES A DAY 90 capsule 3   Incontinence Supply Disposable (DEPEND UNDERWEAR SM/MED) MISC Use as directed four times per day 120 each 5   losartan (COZAAR) 100 MG tablet Take 100 mg by mouth daily.     magnesium gluconate (MAGONATE) 500 MG tablet TAKE 1 TABLET BY MOUTH TWICE A DAY 90 tablet 3   meclizine (ANTIVERT) 12.5 MG tablet TAKE 1 TABLET BY MOUTH THREE TIMES A DAY AS NEEDED FOR DIZZINESS (Patient taking differently: Take 12.5 mg by mouth 3 (three) times daily as needed for dizziness.) 90 tablet 2   montelukast (SINGULAIR) 10 MG tablet TAKE 1 TABLET BY MOUTH EVERY DAY 90 tablet 1   Multiple Vitamin (MULTIVITAMIN WITH MINERALS) TABS tablet Take 1 tablet by mouth daily. Centrum Silver     pantoprazole (PROTONIX) 40 MG tablet TAKE 1 TABLET BY MOUTH EVERY DAY 90 tablet 3   potassium chloride (KLOR-CON) 10 MEQ tablet TAKE 1 TABLET BY MOUTH EVERY DAY WHEN TAKING FUROSEMIDE (Patient taking differently: Take 10 mEq by mouth every Monday, Wednesday, and Friday.) 90 tablet 3   predniSONE (DELTASONE) 1 MG tablet Take 1 tablet (1 mg total) by mouth daily as needed. 90 tablet 3   triamcinolone cream (KENALOG) 0.1 % APPLY TO AFFECTED AREA TWICE A DAY  (Patient taking differently: Apply 1 application  topically 2 (two) times daily as needed (rash/irritation).) 30 g 1   diclofenac Sodium (VOLTAREN) 1 % GEL Inhale 1 puff into the lungs 2 (two) times daily.     No current facility-administered medications on file prior to visit.        ROS:  All others reviewed and negative.  Objective        PE:  BP (!) 142/64  Pulse 80   Temp 98.7 F (37.1 C) (Oral)   Ht _0  (1.549 m)   Wt 156 lb 8 oz (71 kg)   SpO2 95%   BMI 29.57 kg/m                 Constitutional: Pt appears in NAD               HENT: Head: NCAT.                Right Ear: External ear normal.                 Left Ear: External ear normal.  Bilat tm's with mild erythema.  Max sinus areas non tender.  Pharynx with mild erythema, no exudate               Eyes: . Pupils are equal, round, and reactive to light. Conjunctivae and EOM are normal               Nose: without d/c or deformity               Neck: Neck supple. Gross normal ROM               Cardiovascular: Normal rate and regular rhythm.                 Pulmonary/Chest: Effort normal and breath sounds without rales or wheezing.                Abd:  Soft, NT, ND, + BS, no organomegaly               Neurological: Pt is alert. At baseline orientation, motor grossly intact               Skin: Skin is warm. No rashes, no other new lesions, LE edema - none               Psychiatric: Pt behavior is normal without agitation   Micro: none  Cardiac tracings I have personally interpreted today:  none  Pertinent Radiological findings (summarize): none   Lab Results  Component Value Date   WBC 8.7 02/02/2022   HGB 12.3 02/02/2022   HCT 37.2 02/02/2022   PLT 234.0 02/02/2022   GLUCOSE 163 (H) 02/09/2022   CHOL 246 (H) 02/02/2022   TRIG 214.0 (H) 02/02/2022   HDL 85.20 02/02/2022   LDLDIRECT 137.0 02/02/2022   LDLCALC 111 (H) 01/24/2021   ALT 32 02/02/2022   AST 22 02/02/2022   NA 144 02/09/2022   K 4.0 02/09/2022    CL 108 02/09/2022   CREATININE 1.46 (H) 02/09/2022   BUN 40 (H) 02/09/2022   CO2 28 02/09/2022   TSH 1.46 02/02/2022   INR 1.0 07/27/2021   HGBA1C 6.2 02/02/2022   Assessment/Plan:  Glenys YENNY KOSA is a 86 y.o. Black or African American [2] female with  has a past medical history of Allergic rhinitis (10/30/2016), Anemia, Asthma, Breast cancer of upper-outer quadrant of left female breast ([place]) (11/08/2013), Chronic renal insufficiency, Chronic rhinitis, Colon polyp, Diastolic dysfunction (34/19/3790), DJD (degenerative joint disease), Dyspnea, Frozen shoulder, Full dentures, GERD (gastroesophageal reflux disease), Hearing loss, Hypertension, Hyponatremia, Impaired glucose tolerance (07/18/2014), Memory loss, Morbid obesity ([place]), Poor circulation, Vertigo, and Wears glasses.  Daytime somnolence ? With allergy congestion related snoring leading to osa like disorder - if not better with steroid tx today, then consider pulm referral r/o osa  CKD (chronic kidney disease)  stage 4, GFR 15-29 ml/min (HCC) Now on farxiga 5 mg  Lab Results  Component Value Date   CREATININE 1.46 (H) 02/09/2022   Stable overall, cont to avoid nephrotoxins, f/u renal as planned  Allergic rhinitis With recent seasonal worsening - for prednisone taper,  to f/u any worsening symptoms or concerns  Vitamin D deficiency Last vitamin D Lab Results  Component Value Date   VD25OH 60.42 02/02/2022   Stable, cont oral replacement   Followup: Return in about 3 months (around 07/31/2022).  Cathlean Cower, MD 04/30/2022 8:23 PM [facility]

## 2022-04-30 NOTE — Assessment & Plan Note (Signed)
Now on farxiga 5 mg  Lab Results  Component Value Date   CREATININE 1.46 (H) 02/09/2022   Stable overall, cont to avoid nephrotoxins, f/u renal as planned

## 2022-04-30 NOTE — Assessment & Plan Note (Signed)
?   With allergy congestion related snoring leading to osa like disorder - if not better with steroid tx today, then consider pulm referral r/o osa

## 2022-04-30 NOTE — Assessment & Plan Note (Signed)
Last vitamin D Lab Results  Component Value Date   VD25OH 60.42 02/02/2022   Stable, cont oral replacement  

## 2022-04-30 NOTE — Patient Instructions (Addendum)
Please take all new medication as prescribed - the extra prednisone today  If your snoring and sleepy during the day are not improved, please call for referral to pulmonary for possible sleep apnea  Please continue all other medications as before, and refills have been done if requested.  Please have the pharmacy call with any other refills you may need.  Please keep your appointments with your specialists as you may have planned  Please make an Appointment to return in 3 months, or sooner if needed

## 2022-04-30 NOTE — Assessment & Plan Note (Signed)
With recent seasonal worsening - for prednisone taper,  to f/u any worsening symptoms or concerns

## 2022-05-06 ENCOUNTER — Telehealth: Payer: Self-pay | Admitting: Internal Medicine

## 2022-05-06 NOTE — Telephone Encounter (Signed)
Pt called in and states daughter advised she call PCP because she thinks pt has had an attitude change after taking predniSONE (DELTASONE) 1 MG tablet.   Also, requesting a referral to speak with someone about forgetfulness.

## 2022-05-07 NOTE — Telephone Encounter (Signed)
Patient notified

## 2022-05-07 NOTE — Telephone Encounter (Signed)
  Although the pt has taken prednisone in the past apparently well, as we get older, we can get more sensitive to side effects from prednisone.  This is a very small dose so I wouldn't predict behavior change from this very small amount, but it is not impossible.    Please ask pt and duaghter to f/u with Dr Ranell Patrick who prescribed the medication to see if other options might be appropriate,  If it is felt this medication is not causing her symptoms, please ask pt to make ROV with any provider at next available as behviour changes can also be seen with other issues, including a coincidental urinary tract infection, for example.  If pt is confused or hallucination or other unsusual symptoms such as pain or fever, please go to ED now  thanks

## 2022-05-09 ENCOUNTER — Other Ambulatory Visit: Payer: Self-pay | Admitting: Internal Medicine

## 2022-05-13 ENCOUNTER — Other Ambulatory Visit: Payer: Self-pay | Admitting: Internal Medicine

## 2022-05-13 NOTE — Telephone Encounter (Signed)
Please refill as per office routine med refill policy (all routine meds to be refilled for 3 mo or monthly (per pt preference) up to one year from last visit, then month to month grace period for 3 mo, then further med refills will have to be denied) ? ?

## 2022-05-15 ENCOUNTER — Other Ambulatory Visit: Payer: Self-pay | Admitting: Physical Medicine and Rehabilitation

## 2022-05-22 ENCOUNTER — Ambulatory Visit: Payer: Medicare HMO

## 2022-05-24 ENCOUNTER — Other Ambulatory Visit: Payer: Self-pay | Admitting: Internal Medicine

## 2022-05-25 DIAGNOSIS — N184 Chronic kidney disease, stage 4 (severe): Secondary | ICD-10-CM | POA: Diagnosis not present

## 2022-05-25 DIAGNOSIS — K219 Gastro-esophageal reflux disease without esophagitis: Secondary | ICD-10-CM | POA: Diagnosis not present

## 2022-05-25 DIAGNOSIS — Z6830 Body mass index (BMI) 30.0-30.9, adult: Secondary | ICD-10-CM | POA: Diagnosis not present

## 2022-05-25 DIAGNOSIS — M109 Gout, unspecified: Secondary | ICD-10-CM | POA: Diagnosis not present

## 2022-05-25 DIAGNOSIS — D539 Nutritional anemia, unspecified: Secondary | ICD-10-CM | POA: Diagnosis not present

## 2022-05-25 DIAGNOSIS — I129 Hypertensive chronic kidney disease with stage 1 through stage 4 chronic kidney disease, or unspecified chronic kidney disease: Secondary | ICD-10-CM | POA: Diagnosis not present

## 2022-05-25 DIAGNOSIS — R7303 Prediabetes: Secondary | ICD-10-CM | POA: Diagnosis not present

## 2022-05-25 DIAGNOSIS — M129 Arthropathy, unspecified: Secondary | ICD-10-CM | POA: Diagnosis not present

## 2022-05-25 DIAGNOSIS — I10 Essential (primary) hypertension: Secondary | ICD-10-CM | POA: Diagnosis not present

## 2022-05-25 DIAGNOSIS — Z0130 Encounter for examination of blood pressure without abnormal findings: Secondary | ICD-10-CM | POA: Diagnosis not present

## 2022-05-25 DIAGNOSIS — Z131 Encounter for screening for diabetes mellitus: Secondary | ICD-10-CM | POA: Diagnosis not present

## 2022-05-25 DIAGNOSIS — Z79899 Other long term (current) drug therapy: Secondary | ICD-10-CM | POA: Diagnosis not present

## 2022-05-26 DIAGNOSIS — I5022 Chronic systolic (congestive) heart failure: Secondary | ICD-10-CM | POA: Diagnosis not present

## 2022-05-29 ENCOUNTER — Ambulatory Visit: Payer: Medicare HMO

## 2022-05-29 DIAGNOSIS — Z0000 Encounter for general adult medical examination without abnormal findings: Secondary | ICD-10-CM

## 2022-05-29 NOTE — Progress Notes (Addendum)
Subjective:   [name] is a 86 y.o. female who presents for Medicare Annual (Subsequent) preventive examination.  Review of Systems           Objective:    There were no vitals filed for this visit. There is no height or weight on file to calculate BMI.     04/23/2022   10:08 AM 07/28/2021    9:05 AM 07/27/2021    4:27 PM 05/20/2021    9:33 AM 02/28/2018    3:31 PM 12/27/2017   11:00 AM 12/26/2017   11:07 PM  Advanced Directives  Does Patient Have a Medical Advance Directive? Yes Yes Yes Yes Yes Yes Yes  Type of Advance Directive Living will Healthcare Power of [place];Living will Healthcare Power of [place];Living will Living will;Healthcare Power of Attorney Living will Healthcare Power of [place];Living will   Does patient want to make changes to medical advance directive?  No - Patient declined  No - Patient declined  No - Patient declined Yes (ED - Information included in AVS)  Copy of Healthcare Power of Attorney in Chart?  No - copy requested No - copy requested No - copy requested  No - copy requested     Current Medications (verified) Outpatient Encounter Medications as of 05/29/2022  Medication Sig   ADVAIR DISKUS 250-50 MCG/ACT AEPB INHALE 1 PUFF INTO THE LUNGS IN THE MORNING AND AT BEDTIME.   albuterol (VENTOLIN HFA) 108 (90 Base) MCG/ACT inhaler Inhale 1 puff into the lungs every 6 (six) hours as needed for wheezing or shortness of breath.   allopurinol (ZYLOPRIM) 100 MG tablet Take 100 mg by mouth daily.   amLODipine (NORVASC) 5 MG tablet TAKE 1 TABLET BY MOUTH EVERY DAY (Patient taking differently: Take 5 mg by mouth every morning.)   aspirin EC 81 MG tablet Take 81 mg by mouth every morning. Swallow whole.   Cholecalciferol (VITAMIN D3) 20 MCG (800 UNIT) TABS Take 1 tablet by mouth daily.   diclofenac Sodium (VOLTAREN) 1 % GEL Inhale 1 puff into the lungs 2 (two) times daily.   diclofenac Sodium (VOLTAREN) 1 % GEL APPLY 2 GRAMS TO AFFECTED AREA 4 TIMES A  DAY   Ensure (ENSURE) Take 1 Can by mouth 3 (three) times daily between meals. (Patient taking differently: Take 237 mLs by mouth See admin instructions. Drink one can (237 mls) by mouth once or twice daily)   FARXIGA 10 MG TABS tablet Take 10 mg by mouth daily.   febuxostat (ULORIC) 40 MG tablet Take 40 mg by mouth daily.   furosemide (LASIX) 40 MG tablet Take 1 tablet (40 mg total) by mouth every Monday, Wednesday, and Friday.   gabapentin (NEURONTIN) 300 MG capsule TAKE 1 CAPSULE BY MOUTH THREE TIMES A DAY   Incontinence Supply Disposable (DEPEND UNDERWEAR SM/MED) MISC Use as directed four times per day   losartan (COZAAR) 100 MG tablet Take 100 mg by mouth daily.   magnesium gluconate (MAGONATE) 500 MG tablet TAKE 1 TABLET BY MOUTH TWICE A DAY   meclizine (ANTIVERT) 12.5 MG tablet TAKE 1 TABLET BY MOUTH THREE TIMES A DAY AS NEEDED FOR DIZZINESS (Patient taking differently: Take 12.5 mg by mouth 3 (three) times daily as needed for dizziness.)   montelukast (SINGULAIR) 10 MG tablet TAKE 1 TABLET BY MOUTH EVERY DAY   Multiple Vitamin (MULTIVITAMIN WITH MINERALS) TABS tablet Take 1 tablet by mouth daily. Centrum Silver   pantoprazole (PROTONIX) 40 MG tablet TAKE 1 TABLET BY MOUTH  EVERY DAY   potassium chloride (KLOR-CON) 10 MEQ tablet TAKE 1 TABLET BY MOUTH EVERY DAY WHEN TAKING FUROSEMIDE (Patient taking differently: Take 10 mEq by mouth every Monday, Wednesday, and Friday.)   predniSONE (DELTASONE) 1 MG tablet Take 1 tablet (1 mg total) by mouth daily as needed.   RYBELSUS 3 MG TABS Take 1 tablet by mouth every morning.   triamcinolone cream (KENALOG) 0.1 % APPLY TO AFFECTED AREA TWICE A DAY (Patient taking differently: Apply 1 application  topically 2 (two) times daily as needed (rash/irritation).)   [DISCONTINUED] FARXIGA 5 MG TABS tablet Take 5 mg by mouth daily.   [DISCONTINUED] predniSONE (DELTASONE) 10 MG tablet 3 tabs by mouth per day for 3 days,2tabs per day for 3 days,1tab per day for 3  days   No facility-administered encounter medications on file as of 05/29/2022.    Allergies (verified) Hydrocodone, Lasix [furosemide], Tizanidine, and Iron   History: Past Medical History:  Diagnosis Date   Allergic rhinitis 10/30/2016   Anemia    Asthma    Breast cancer of upper-outer quadrant of left female breast ([place]) 11/08/2013   ER/PR+ Her2- Left IDC    Chronic renal insufficiency    Chronic rhinitis    Colon polyp    Diastolic dysfunction 25/42/7062   DJD (degenerative joint disease)    Dyspnea    Frozen shoulder    Full dentures    GERD (gastroesophageal reflux disease)    Hearing loss    Hypertension    Hyponatremia    Impaired glucose tolerance 07/18/2014   Memory loss    Morbid obesity ([place])    Poor circulation    Vertigo    Wears glasses    Past Surgical History:  Procedure Laterality Date   ABDOMINAL HYSTERECTOMY     BREAST LUMPECTOMY WITH NEEDLE LOCALIZATION AND AXILLARY SENTINEL LYMPH NODE BX Left 12/04/2013   Procedure: BREAST LUMPECTOMY WITH NEEDLE LOCALIZATION AND AXILLARY SENTINEL LYMPH NODE BX;  Surgeon: Shann Medal, MD;  Location: [facility];  Service: General;  Laterality: Left;   CATARACT EXTRACTION  2009   rt   COLONOSCOPY     EYE SURGERY Bilateral    cataract surgery   KNEE ARTHROSCOPY     both   LUMBAR LAMINECTOMY/DECOMPRESSION MICRODISCECTOMY Left 12/23/2017   Procedure: Left Lumbar One-Two Laminectomy with microdiscectomy;  Surgeon: Eustace Moore, MD;  Location: [facility];  Service: Neurosurgery;  Laterality: Left;  Left L1-2 Laminectomy with microdiscectomy   TONSILLECTOMY     TOTAL KNEE ARTHROPLASTY  2002   rt   TOTAL KNEE ARTHROPLASTY  2003   left   VESICOVAGINAL FISTULA CLOSURE W/ TAH  1980   Family History  Problem Relation Age of Onset   Colon cancer Mother        in her 26's   Stomach cancer Mother    Lung cancer Brother        was a smoker   Heart attack Son 19   Healthy Daughter    Social History    Socioeconomic History   Marital status: Widowed    Spouse name: Not on file   Number of children: 2   Years of education: Not on file   Highest education level: Not on file  Occupational History   Occupation: owns bakery and works PT for [employer]  Tobacco Use   Smoking status: Never   Smokeless tobacco: Never  Vaping Use   Vaping Use: Never used  Substance and  Sexual Activity   Alcohol use: No    Alcohol/week: 0.0 standard drinks of alcohol   Drug use: No   Sexual activity: Not Currently  Other Topics Concern   Not on file  Social History Narrative   Not on file   Social Determinants of Health   Financial Resource Strain: Low Risk  (05/29/2022)   Overall Financial Resource Strain (CARDIA)    Difficulty of Paying Living Expenses: Not hard at all  Food Insecurity: No Food Insecurity (05/29/2022)   Hunger Vital Sign    Worried About Running Out of Food in the Last Year: Never true    Ran Out of Food in the Last Year: Never true  Transportation Needs: No Transportation Needs (05/29/2022)   PRAPARE - Hydrologist (Medical): No    Lack of Transportation (Non-Medical): No  Physical Activity: Insufficiently Active (05/29/2022)   Exercise Vital Sign    Days of Exercise per Week: 4 days    Minutes of Exercise per Session: 20 min  Stress: No Stress Concern Present (05/29/2022)   [place]    Feeling of Stress : Not at all  Social Connections: Moderately Integrated (05/29/2022)   Social Connection and Isolation Panel [NHANES]    Frequency of Communication with Friends and Family: More than three times a week    Frequency of Social Gatherings with Friends and Family: More than three times a week    Attends Religious Services: More than 4 times per year    Active Member of [employer] or Organizations: Yes    Attends Archivist Meetings: More than 4 times per year    Marital Status:  Widowed    Tobacco Counseling Counseling given: Not Answered   Clinical Intake:  Pre-visit preparation completed: Yes  Pain : No/denies pain     Nutritional Risks: Other (Comment) (visit was done over the phone) Diabetes: No  How often do you need to have someone help you when you read instructions, pamphlets, or other written materials from your doctor or pharmacy?: 1 - Never What is the last grade level you completed in school?: 11th  Diabetic?No  Interpreter Needed?: No  Information entered by :: Maryelizabeth Kaufmann, [place] of Daily Living    07/28/2021    9:07 AM 07/28/2021    9:06 AM  In your present state of health, do you have any difficulty performing the following activities:  Hearing?  0  Vision?  0  Difficulty concentrating or making decisions?  1  Walking or climbing stairs?  1  Comment  secondary to weakness  Dressing or bathing?  1  Doing errands, shopping? 1     Patient Care Team: Biagio Borg, MD as PCP - General (Internal Medicine) Magrinat, [name], MD (Inactive) as Consulting Physician (Oncology) [name], MD as Consulting Physician (Radiation Oncology) [name], MD as Consulting Physician (Obstetrics and Gynecology) [name], FNP as Nurse Practitioner (Psychiatry) [name], MD as Consulting Physician (Radiology) [facility] as Consulting Physician (Ophthalmology)  Indicate any recent Medical Services you may have received from other than [facility] providers in the past year (date may be approximate).     Assessment:   This is a routine wellness examination for Windy.  Hearing/Vision screen No results found.  Dietary issues and exercise activities discussed:     Goals Addressed   None   Depression Screen    05/29/2022  11:44 AM 04/30/2022    2:59 PM 04/23/2022   10:08 AM 02/02/2022    9:47 AM 05/20/2021    9:42 AM 01/24/2021    3:53 PM 01/24/2021    3:33 PM  PHQ 2/9  Scores  PHQ - 2 Score 0 0 0 0 0 0 0  PHQ- 9 Score 0 0         Fall Risk    05/29/2022   11:46 AM 04/30/2022    3:00 PM 04/23/2022   10:08 AM 02/02/2022    9:47 AM 05/20/2021    9:42 AM  Fall Risk   Falls in the past year? $RemoveBe'1 1 1 'jfNTRvJaf$ 0 1  Number falls in past yr: 0 0 0 0 0  Injury with Fall? 0 1 1 0 0  Comment   admitted in hospital and rehab    Risk for fall due to : History of fall(s)    History of fall(s);Impaired balance/gait  Follow up     Falls evaluation completed    FALL RISK PREVENTION PERTAINING TO THE HOME:  Any stairs in or around the home? No  If so, are there any without handrails? No  Home free of loose throw rugs in walkways, pet beds, electrical cords, etc? Yes  Adequate lighting in your home to reduce risk of falls? Yes   ASSISTIVE DEVICES UTILIZED TO PREVENT FALLS:  Life alert? Yes  Use of a cane, walker or w/c? Yes  Grab bars in the bathroom? No  Shower chair or bench in shower? Yes  Elevated toilet seat or a handicapped toilet? Yes   TIMED UP AND GO:  Was the test performed? Yes .  Length of time to ambulate 10 feet:  sec.     Cognitive Function:    08/17/2017    8:54 AM  MMSE - Mini Mental State Exam  Orientation to time 5  Orientation to Place 5  Registration 3  Attention/ Calculation 3  Recall 2  Language- name 2 objects 2  Language- repeat 1  Language- follow 3 step command 3  Language- read & follow direction 1  Write a sentence 1  Copy design 1  Total score 27        05/29/2022   11:45 AM  6CIT Screen  What Year? 0 points  What month? 0 points  What time? 0 points  Count back from 20 0 points  Months in reverse 0 points  Repeat phrase 0 points  Total Score 0 points    Immunizations Immunization History  Administered Date(s) Administered   Fluad Quad(high Dose 65+) 11/27/2020, 06/27/2021   Influenza Split 08/07/2011, 10/13/2012   Influenza Whole 07/27/2008, 08/14/2009, 07/01/2010   Influenza, High Dose Seasonal PF 07/10/2016,  08/10/2017, 08/30/2018, 09/08/2019   Influenza,inj,Quad PF,6+ Mos 08/28/2013, 07/18/2014, 07/26/2015   PFIZER(Purple Top)SARS-COV-2 Vaccination 12/11/2019, 01/02/2020   Pneumococcal Conjugate-13 02/20/2014   Pneumococcal Polysaccharide-23 10/26/2005   Tdap 11/22/2017    TDAP status: Up to date  Flu Vaccine status: Up to date  Pneumococcal vaccine status: Up to date  Covid-19 vaccine status: Information provided on how to obtain vaccines.   Qualifies for Shingles Vaccine? Yes   Zostavax completed No   Shingrix Completed?: No.    Education has been provided regarding the importance of this vaccine. Patient has been advised to call insurance company to determine out of pocket expense if they have not yet received this vaccine. Advised may also receive vaccine at local pharmacy or Health Dept. Verbalized acceptance and  understanding.  Screening Tests Health Maintenance  Topic Date Due   Zoster Vaccines- Shingrix (1 of 2) Never done   COVID-19 Vaccine (4 - Booster for Pfizer series) 12/29/2021   INFLUENZA VACCINE  05/26/2022   Diabetic kidney evaluation - GFR measurement  02/10/2023   Diabetic kidney evaluation - Urine ACR  03/10/2023   TETANUS/TDAP  11/23/2027   Pneumonia Vaccine 32+ Years old  Completed   DEXA SCAN  Completed   HPV VACCINES  Aged Out    Health Maintenance  Health Maintenance Due  Topic Date Due   Zoster Vaccines- Shingrix (1 of 2) Never done   COVID-19 Vaccine (4 - Booster for Pfizer series) 12/29/2021   INFLUENZA VACCINE  05/26/2022    Colorectal cancer screening: No longer required.   Mammogram status: No longer required due to age.    Lung Cancer Screening: (Low Dose CT Chest recommended if Age 54-80 years, 30 pack-year currently smoking OR have quit w/in 15years.) does not qualify.   Lung Cancer Screening Referral:   Additional Screening:  Hepatitis C Screening: does not qualify; Completed   Vision Screening: Recommended annual ophthalmology  exams for early detection of glaucoma and other disorders of the eye. Is the patient up to date with their annual eye exam?  Yes  Who is the provider or what is the name of the office in which the patient attends annual eye exams? [facility] If pt is not established with a provider, would they like to be referred to a provider to establish care? Yes .   Dental Screening: Recommended annual dental exams for proper oral hygiene  Community Resource Referral / Chronic Care Management: CRR required this visit?  No   CCM required this visit?  No      Plan:     I have personally reviewed and noted the following in the patient's chart:   Medical and social history Use of alcohol, tobacco or illicit drugs  Current medications and supplements including opioid prescriptions.  Functional ability and status Nutritional status Physical activity Advanced directives List of other physicians Hospitalizations, surgeries, and ER visits in previous 12 months Vitals Screenings to include cognitive, depression, and falls Referrals and appointments  In addition, I have reviewed and discussed with patient certain preventive protocols, quality metrics, and best practice recommendations. A written personalized care plan for preventive services as well as general preventive health recommendations were provided to patient.     Thomes Cake, [place]   05/29/2022   Nurse Notes: No concerns, patient was advised that if anything changes to give our office a call.   I connected with  Dalayah J Budzinski on 06/01/22 by a video enabled telemedicine application and verified that I am speaking with the correct person using two identifiers.   I discussed the limitations of evaluation and management by telemedicine. The patient expressed understanding and agreed to proceed.

## 2022-05-29 NOTE — Patient Instructions (Signed)
It was a pleasure speaking with you today   Please follow up in 1 year  

## 2022-05-30 ENCOUNTER — Other Ambulatory Visit: Payer: Self-pay | Admitting: Internal Medicine

## 2022-05-30 NOTE — Telephone Encounter (Signed)
Please refill as per office routine med refill policy (all routine meds to be refilled for 3 mo or monthly (per pt preference) up to one year from last visit, then month to month grace period for 3 mo, then further med refills will have to be denied) ? ?

## 2022-06-01 DIAGNOSIS — I129 Hypertensive chronic kidney disease with stage 1 through stage 4 chronic kidney disease, or unspecified chronic kidney disease: Secondary | ICD-10-CM | POA: Diagnosis not present

## 2022-06-01 DIAGNOSIS — K219 Gastro-esophageal reflux disease without esophagitis: Secondary | ICD-10-CM | POA: Diagnosis not present

## 2022-06-01 DIAGNOSIS — I10 Essential (primary) hypertension: Secondary | ICD-10-CM | POA: Diagnosis not present

## 2022-06-01 DIAGNOSIS — M109 Gout, unspecified: Secondary | ICD-10-CM | POA: Diagnosis not present

## 2022-06-01 DIAGNOSIS — M19012 Primary osteoarthritis, left shoulder: Secondary | ICD-10-CM | POA: Diagnosis not present

## 2022-06-01 DIAGNOSIS — Z131 Encounter for screening for diabetes mellitus: Secondary | ICD-10-CM | POA: Diagnosis not present

## 2022-06-01 DIAGNOSIS — N184 Chronic kidney disease, stage 4 (severe): Secondary | ICD-10-CM | POA: Diagnosis not present

## 2022-06-01 DIAGNOSIS — M19011 Primary osteoarthritis, right shoulder: Secondary | ICD-10-CM | POA: Diagnosis not present

## 2022-06-01 DIAGNOSIS — Z0130 Encounter for examination of blood pressure without abnormal findings: Secondary | ICD-10-CM | POA: Diagnosis not present

## 2022-06-01 DIAGNOSIS — Z6830 Body mass index (BMI) 30.0-30.9, adult: Secondary | ICD-10-CM | POA: Diagnosis not present

## 2022-06-01 DIAGNOSIS — M25449 Effusion, unspecified hand: Secondary | ICD-10-CM | POA: Diagnosis not present

## 2022-06-01 DIAGNOSIS — M129 Arthropathy, unspecified: Secondary | ICD-10-CM | POA: Diagnosis not present

## 2022-06-05 ENCOUNTER — Ambulatory Visit: Payer: Medicare HMO | Admitting: Podiatrist

## 2022-06-05 ENCOUNTER — Encounter: Payer: Self-pay | Admitting: Podiatrist

## 2022-06-05 DIAGNOSIS — M79675 Pain in left toe(s): Secondary | ICD-10-CM | POA: Diagnosis not present

## 2022-06-05 DIAGNOSIS — B351 Tinea unguium: Secondary | ICD-10-CM | POA: Diagnosis not present

## 2022-06-05 DIAGNOSIS — I999 Unspecified disorder of circulatory system: Secondary | ICD-10-CM

## 2022-06-05 DIAGNOSIS — M79674 Pain in right toe(s): Secondary | ICD-10-CM | POA: Diagnosis not present

## 2022-06-05 DIAGNOSIS — L89891 Pressure ulcer of other site, stage 1: Secondary | ICD-10-CM

## 2022-06-05 NOTE — Progress Notes (Signed)
Chief Complaint  Patient presents with   Debridement    Trim toenails - tender spot tip of hallux right      HPI: Patient is 85 y.o. female who presents today for elongated toenails which are and painful and symptomatic with shoe gear.  She also has a tender area on the tip of the right hallux which she is recently noted.  Patient Active Problem List   Diagnosis Date Noted   Daytime somnolence 04/30/2022   B12 deficiency 02/08/2022   Primary osteoarthritis of both shoulders 78/29/5621   Acute metabolic encephalopathy    Sepsis with encephalopathy without septic shock ([place])    AMS (altered mental status) 07/29/2021   Altered mental status 07/27/2021   Chronic diastolic CHF (congestive heart failure) ([place]) 07/27/2021   Right hip pain 06/27/2021   Right knee pain 30/86/5784   Acute diastolic congestive heart failure ([place]) 06/18/2021   Calcium pyrophosphate deposition disease 02/12/2021   Fibromyalgia 02/12/2021   Polymyalgia rheumatica ([place]) 02/12/2021   Inflammatory arthritis 02/12/2021   Left knee pain 02/12/2021   Long term (current) use of systemic steroids 02/01/2021   Nail disorder 01/24/2021   Toe pain, right 01/24/2021   Chondrocalcinosis 12/19/2020   Primary osteoarthritis of both hands 12/19/2020   Pain in joint of left shoulder 08/22/2020   Pain in joint of right shoulder 08/22/2020   Shoulder pain 07/31/2020   Chronic pain 04/23/2020   Supraclavicular fossa fullness 04/23/2020   Finding of neck region 04/23/2020   Low grade fever 12/07/2019   OAB (overactive bladder) 12/07/2019   Pain of right heel 12/07/2019   Overactive bladder 12/07/2019   Diarrhea 04/13/2019   Abnormal gait 08/30/2018   Asthenia 08/30/2018   Conjunctivitis of left eye 04/13/2018   Rash 04/13/2018   Eruption at injection site 04/13/2018   Acute bilateral deep vein thrombosis (DVT) of femoral veins (HCC) 01/31/2018   Hypokalemia 01/31/2018   Electrocardiogram abnormal 01/31/2018    Increased creatine kinase level 01/31/2018   Left leg pain 01/13/2018   Left leg swelling 01/13/2018   Hearing loss 01/13/2018   Intractable pain 12/27/2017   Arachnoiditis 12/27/2017   Pain 12/27/2017   Hyponatremia    S/P lumbar laminectomy 12/23/2017   History of lumbar laminectomy 12/23/2017   Wheezing 12/10/2017   Itching 11/22/2017   Urinary frequency 11/22/2017   Increased frequency of urination 11/22/2017   Degeneration of lumbar intervertebral disc 11/05/2017   Sciatica 09/04/2017   Asthma exacerbation 08/31/2017   Acute asthma 08/31/2017   Flare of rheumatoid arthritis ([place]) 05/11/2017   Articular gout 05/01/2017   Vertigo 10/30/2016   Allergic rhinitis 10/30/2016   Incontinence of feces 07/31/2016   Hemorrhoids 07/31/2016   Hematochezia 07/31/2016   Peripheral venous insufficiency 69/62/9528   Diastolic dysfunction 41/32/4401   Peripheral edema 07/10/2016   Left ankle pain 06/02/2016   Neck pain 03/10/2016   Low back pain 03/10/2016   Right cervical radiculopathy 11/13/2015   Bradycardia 03/15/2015   Joint pain 12/18/2014   Fever 11/27/2014   Impaired glucose tolerance 07/18/2014   Right lumbar radiculopathy 07/18/2014   Left lumbar radiculopathy 07/18/2014   Encounter for well adult exam with abnormal findings 02/20/2014   Malignant neoplasm of upper-outer quadrant of female breast ([place]) 11/08/2013   Diverticular disease 07/09/2012   Headache(784.0) 06/30/2012   Anemia 06/29/2012   Dyspnea 03/11/2012   Cough 03/11/2012   CKD (chronic kidney disease) stage 4, GFR 15-29 ml/min (HCC) 05/18/2011   Gastroesophageal reflux disease 07/05/2008  Vitamin D deficiency 12/19/2007   Morbid obesity ([place]) 12/16/2007   Essential hypertension 12/16/2007   DEGENERATIVE JOINT DISEASE 12/16/2007   Osteoarthritis 12/16/2007    Current Outpatient Medications on File Prior to Visit  Medication Sig Dispense Refill   ADVAIR DISKUS 250-50 MCG/ACT AEPB INHALE 1 PUFF INTO THE  LUNGS IN THE MORNING AND AT BEDTIME. 60 each 11   albuterol (VENTOLIN HFA) 108 (90 Base) MCG/ACT inhaler Inhale 1 puff into the lungs every 6 (six) hours as needed for wheezing or shortness of breath.     allopurinol (ZYLOPRIM) 100 MG tablet Take 100 mg by mouth daily.     amLODipine (NORVASC) 5 MG tablet TAKE 1 TABLET BY MOUTH EVERY DAY (Patient taking differently: Take 5 mg by mouth every morning.) 90 tablet 3   aspirin EC 81 MG tablet Take 81 mg by mouth every morning. Swallow whole.     Cholecalciferol (VITAMIN D3) 20 MCG (800 UNIT) TABS Take 1 tablet by mouth daily.     diclofenac Sodium (VOLTAREN) 1 % GEL Inhale 1 puff into the lungs 2 (two) times daily.     diclofenac Sodium (VOLTAREN) 1 % GEL APPLY 2 GRAMS TO AFFECTED AREA 4 TIMES A DAY 100 g 5   Ensure (ENSURE) Take 1 Can by mouth 3 (three) times daily between meals. (Patient taking differently: Take 237 mLs by mouth See admin instructions. Drink one can (237 mls) by mouth once or twice daily) 237 mL 12   FARXIGA 10 MG TABS tablet Take 10 mg by mouth daily.     febuxostat (ULORIC) 40 MG tablet Take 40 mg by mouth daily.     furosemide (LASIX) 40 MG tablet Take 1 tablet (40 mg total) by mouth every Monday, Wednesday, and Friday.     gabapentin (NEURONTIN) 300 MG capsule TAKE 1 CAPSULE BY MOUTH THREE TIMES A DAY 90 capsule 3   Incontinence Supply Disposable (DEPEND UNDERWEAR SM/MED) MISC Use as directed four times per day 120 each 5   losartan (COZAAR) 100 MG tablet Take 100 mg by mouth daily.     magnesium gluconate (MAGONATE) 500 MG tablet TAKE 1 TABLET BY MOUTH TWICE A DAY 180 tablet 1   meclizine (ANTIVERT) 12.5 MG tablet TAKE 1 TABLET BY MOUTH THREE TIMES A DAY AS NEEDED FOR DIZZINESS (Patient taking differently: Take 12.5 mg by mouth 3 (three) times daily as needed for dizziness.) 90 tablet 2   montelukast (SINGULAIR) 10 MG tablet TAKE 1 TABLET BY MOUTH EVERY DAY 90 tablet 2   Multiple Vitamin (MULTIVITAMIN WITH MINERALS) TABS tablet  Take 1 tablet by mouth daily. Centrum Silver     pantoprazole (PROTONIX) 40 MG tablet TAKE 1 TABLET BY MOUTH EVERY DAY 90 tablet 3   potassium chloride (KLOR-CON) 10 MEQ tablet TAKE 1 TABLET BY MOUTH EVERY DAY WHEN TAKING FUROSEMIDE (Patient taking differently: Take 10 mEq by mouth every Monday, Wednesday, and Friday.) 90 tablet 3   predniSONE (DELTASONE) 1 MG tablet Take 1 tablet (1 mg total) by mouth daily as needed. 90 tablet 3   RYBELSUS 3 MG TABS Take 1 tablet by mouth every morning.     triamcinolone cream (KENALOG) 0.1 % APPLY TO AFFECTED AREA TWICE A DAY (Patient taking differently: Apply 1 application  topically 2 (two) times daily as needed (rash/irritation).) 30 g 1   No current facility-administered medications on file prior to visit.    Allergies  Allergen Reactions   Hydrocodone Itching   Lasix [Furosemide] Other (See  Comments)    Dizziness.    Tizanidine Other (See Comments)    Dizzy and fall   Iron Hives and Other (See Comments)     bad constipation     Review of Systems No fevers, chills, nausea, muscle aches, no difficulty breathing, no calf pain, no chest pain or shortness of breath.   Physical Exam  GENERAL APPEARANCE: Alert, conversant. Appropriately groomed. No acute distress.   VASCULAR: Pedal pulses palpable 1/4 DP and 0/4 PT bilateral.  Capillary refill time is immediate to all digits,  Proximal to distal cooling it warm to warm.  Digital perfusion adequate.   NEUROLOGIC: sensation is decreased to 5.07 monofilament at 2/5 sites bilateral.  Light touch is intact bilateral, vibratory sensation absent bilateral  MUSCULOSKELETAL: acceptable muscle strength, tone and stability bilateral.  planus foot type is noted  DERMATOLOGIC: skin is warm, supple, and dry.  Color, texture, and turgor of skin within normal limits.  Digital nails are thick, discolored, dystrophic, brittle with subungual debris present and clinically mycotic x 10.     Tip of right great toe-  non blanchable erythema, no calor, appears to have been a blister that has resolved and is now healing.  No redness, no swelling, no drainage.  No sign of infection noted.    Assessment     ICD-10-CM   1. Pain due to onychomycosis of toenails of both feet  B35.1    M79.675    M79.674     2. Pressure injury of toe of right foot, stage 1  L89.891     3. Vascular disease  I99.9        Plan  Gust exam findings with the patient and her daughter.  At today's visit I debrided the toenails in thickness and length the sterile nail nipper and sterile power bur.  I debrided the hyperkeratotic tissue at the distal tip of the right great toe and it does to appear to be a healing area that was previously blistered.  Discussed making sure that shoes are not rubbing against this part of the toe and the patient states that she recently changed the inserts to her diabetic shoes and feels that since then its been helping.  I dispensed a toe For her use as well and recommended she keep a clear close eye on this area and if she notices this any pain or breakdown of the tissue she is to call immediately.  She will be seen back for her routine care appointment in 3 months and if any problems or concerns arise prior to that visit she will call.

## 2022-06-08 DIAGNOSIS — N1832 Chronic kidney disease, stage 3b: Secondary | ICD-10-CM | POA: Diagnosis not present

## 2022-06-08 NOTE — Progress Notes (Signed)
Subjective:   [name] is a 86 y.o. female who presents for Medicare Annual (Subsequent) preventive examination.  I connected with  [name] on 05/29/2022   by a audio enabled telemedicine application and verified that I am speaking with the correct person using two identifiers.  Patient Location: Home  Provider Location: Office/Clinic  I discussed the limitations of evaluation and management by telemedicine. The patient expressed understanding and agreed to proceed.   Review of Systems: Defer to PCP     Cardiac Risk Factors include: advanced age (>37mn, >>65women);hypertension     Objective:    There were no vitals filed for this visit. There is no height or weight on file to calculate BMI.     06/08/2022    4:04 PM 04/23/2022   10:08 AM 07/28/2021    9:05 AM 07/27/2021    4:27 PM 05/20/2021    9:33 AM 02/28/2018    3:31 PM 12/27/2017   11:00 AM  Advanced Directives  Does Patient Have a Medical Advance Directive? _0  Yes Yes  Type of AParamed[place]of [place]upiter IslandLiving will Living will [place] will [place] will Living will;Healthcare Power of Attorney Living will [place] will  Does patient want to make changes to medical advance directive? No - Patient declined  No - Patient declined  No - Patient declined  No - Patient declined  Copy of H[place]ushin Chart? No - copy requested  No - copy requested No - copy requested No - copy requested  No - copy requested    Current Medications (verified) Outpatient Encounter Medications as of 05/29/2022  Medication Sig   ADVAIR DISKUS 250-50 MCG/ACT AEPB INHALE 1 PUFF INTO THE LUNGS IN THE MORNING AND AT BEDTIME.   albuterol (VENTOLIN HFA) 108 (90 Base) MCG/ACT inhaler Inhale 1 puff into the lungs every 6 (six) hours as needed for wheezing or shortness of breath.   allopurinol (ZYLOPRIM)  100 MG tablet Take 100 mg by mouth daily.   amLODipine (NORVASC) 5 MG tablet TAKE 1 TABLET BY MOUTH EVERY DAY (Patient taking differently: Take 5 mg by mouth every morning.)   aspirin EC 81 MG tablet Take 81 mg by mouth every morning. Swallow whole.   Cholecalciferol (VITAMIN D3) 20 MCG (800 UNIT) TABS Take 1 tablet by mouth daily.   diclofenac Sodium (VOLTAREN) 1 % GEL Inhale 1 puff into the lungs 2 (two) times daily.   diclofenac Sodium (VOLTAREN) 1 % GEL APPLY 2 GRAMS TO AFFECTED AREA 4 TIMES A DAY   Ensure (ENSURE) Take 1 Can by mouth 3 (three) times daily between meals. (Patient taking differently: Take 237 mLs by mouth See admin instructions. Drink one can (237 mls) by mouth once or twice daily)   FARXIGA 10 MG TABS tablet Take 10 mg by mouth daily.   febuxostat (ULORIC) 40 MG tablet Take 40 mg by mouth daily.   furosemide (LASIX) 40 MG tablet Take 1 tablet (40 mg total) by mouth every Monday, Wednesday, and Friday.   gabapentin (NEURONTIN) 300 MG capsule TAKE 1 CAPSULE BY MOUTH THREE TIMES A DAY   Incontinence Supply Disposable (DEPEND UNDERWEAR SM/MED) MISC Use as directed four times per day   losartan (COZAAR) 100 MG tablet Take 100 mg by mouth daily.   magnesium gluconate (MAGONATE) 500 MG tablet TAKE 1 TABLET BY MOUTH TWICE A DAY   meclizine (ANTIVERT) 12.5  MG tablet TAKE 1 TABLET BY MOUTH THREE TIMES A DAY AS NEEDED FOR DIZZINESS (Patient taking differently: Take 12.5 mg by mouth 3 (three) times daily as needed for dizziness.)   Multiple Vitamin (MULTIVITAMIN WITH MINERALS) TABS tablet Take 1 tablet by mouth daily. Centrum Silver   pantoprazole (PROTONIX) 40 MG tablet TAKE 1 TABLET BY MOUTH EVERY DAY   potassium chloride (KLOR-CON) 10 MEQ tablet TAKE 1 TABLET BY MOUTH EVERY DAY WHEN TAKING FUROSEMIDE (Patient taking differently: Take 10 mEq by mouth every Monday, Wednesday, and Friday.)   predniSONE (DELTASONE) 1 MG tablet Take 1 tablet (1 mg total) by mouth daily as needed.   RYBELSUS  3 MG TABS Take 1 tablet by mouth every morning.   triamcinolone cream (KENALOG) 0.1 % APPLY TO AFFECTED AREA TWICE A DAY (Patient taking differently: Apply 1 application  topically 2 (two) times daily as needed (rash/irritation).)   [DISCONTINUED] montelukast (SINGULAIR) 10 MG tablet TAKE 1 TABLET BY MOUTH EVERY DAY   [DISCONTINUED] FARXIGA 5 MG TABS tablet Take 5 mg by mouth daily.   [DISCONTINUED] predniSONE (DELTASONE) 10 MG tablet 3 tabs by mouth per day for 3 days,2tabs per day for 3 days,1tab per day for 3 days   No facility-administered encounter medications on file as of 05/29/2022.    Allergies (verified) Hydrocodone, Lasix [furosemide], Tizanidine, and Iron   History: Past Medical History:  Diagnosis Date   Allergic rhinitis 10/30/2016   Anemia    Asthma    Breast cancer of upper-outer quadrant of left female breast ([place]) 11/08/2013   ER/PR+ Her2- Left IDC    Chronic renal insufficiency    Chronic rhinitis    Colon polyp    Diastolic dysfunction 26/37/8588   DJD (degenerative joint disease)    Dyspnea    Frozen shoulder    Full dentures    GERD (gastroesophageal reflux disease)    Hearing loss    Hypertension    Hyponatremia    Impaired glucose tolerance 07/18/2014   Memory loss    Morbid obesity ([place])    Poor circulation    Vertigo    Wears glasses    Past Surgical History:  Procedure Laterality Date   ABDOMINAL HYSTERECTOMY     BREAST LUMPECTOMY WITH NEEDLE LOCALIZATION AND AXILLARY SENTINEL LYMPH NODE BX Left 12/04/2013   Procedure: BREAST LUMPECTOMY WITH NEEDLE LOCALIZATION AND AXILLARY SENTINEL LYMPH NODE BX;  Surgeon: Shann Medal, MD;  Location: [place];  Service: General;  Laterality: Left;   CATARACT EXTRACTION  2009   rt   COLONOSCOPY     EYE SURGERY Bilateral    cataract surgery   KNEE ARTHROSCOPY     both   LUMBAR LAMINECTOMY/DECOMPRESSION MICRODISCECTOMY Left 12/23/2017   Procedure: Left Lumbar One-Two Laminectomy with  microdiscectomy;  Surgeon: Eustace Moore, MD;  Location: [place];  Service: Neurosurgery;  Laterality: Left;  Left L1-2 Laminectomy with microdiscectomy   TONSILLECTOMY     TOTAL KNEE ARTHROPLASTY  2002   rt   TOTAL KNEE ARTHROPLASTY  2003   left   VESICOVAGINAL FISTULA CLOSURE W/ TAH  1980   Family History  Problem Relation Age of Onset   Colon cancer Mother        in her 49's   Stomach cancer Mother    Lung cancer Brother        was a smoker   Heart attack Son 47   Healthy Daughter    Social History   Socioeconomic History  Marital status: Widowed    Spouse name: Not on file   Number of children: 2   Years of education: Not on file   Highest education level: Not on file  Occupational History   Occupation: owns Teacher, adult education and works PT for [employer]  Tobacco Use   Smoking status: Never   Smokeless tobacco: Never  Vaping Use   Vaping Use: Never used  Substance and Sexual Activity   Alcohol use: No    Alcohol/week: 0.0 standard drinks of alcohol   Drug use: No   Sexual activity: Not Currently  Other Topics Concern   Not on file  Social History Narrative   Not on file   Social Determinants of Health   Financial Resource Strain: Low Risk  (05/29/2022)   Overall Financial Resource Strain (CARDIA)    Difficulty of Paying Living Expenses: Not hard at all  Food Insecurity: No Food Insecurity (05/29/2022)   Hunger Vital Sign    Worried About Running Out of Food in the Last Year: Never true    [place] in the Last Year: Never true  Transportation Needs: No Transportation Needs (05/29/2022)   PRAPARE - Hydrologist (Medical): No    Lack of Transportation (Non-Medical): No  Physical Activity: Insufficiently Active (05/29/2022)   Exercise Vital Sign    Days of Exercise per Week: 4 days    Minutes of Exercise per Session: 20 min  Stress: No Stress Concern Present (05/29/2022)   [place]    Feeling of Stress : Not at all  Social Connections: Moderately Integrated (05/29/2022)   Social Connection and Isolation Panel [NHANES]    Frequency of Communication with Friends and Family: More than three times a week    Frequency of Social Gatherings with Friends and Family: More than three times a week    Attends Religious Services: More than 4 times per year    Active Member of [employer] or Organizations: Yes    Attends Archivist Meetings: More than 4 times per year    Marital Status: Widowed    Tobacco Counseling Counseling given: Not Answered   Clinical Intake:  Pre-visit preparation completed: Yes  Pain : No/denies pain     Nutritional Risks: Other (Comment) (visit was done over the phone) Diabetes: No  How often do you need to have someone help you when you read instructions, pamphlets, or other written materials from your doctor or pharmacy?: 1 - Never What is the last grade level you completed in school?: 11th  Diabetic?No  Interpreter Needed?: No  Information entered by :: [name], [place] of Daily Living    06/08/2022    4:05 PM 07/28/2021    9:07 AM  In your present state of health, do you have any difficulty performing the following activities:  Hearing? 0   Vision? 0   Difficulty concentrating or making decisions? 0   Walking or climbing stairs? 1   Dressing or bathing? 0   Doing errands, shopping? 0 1  Preparing Food and eating ? N   Using the Toilet? N   In the past six months, have you accidently leaked urine? N   Do you have problems with loss of bowel control? N   Managing your Medications? N   Managing your Finances? N   Housekeeping or managing your Housekeeping? N     Patient Care Team: Cathlean Cower  W, MD as PCP - General (Internal Medicine) Magrinat, [name], MD (Inactive) as Consulting Physician (Oncology) [name], MD as Consulting Physician (Radiation Oncology) [name], MD as  Consulting Physician (Obstetrics and Gynecology) [name], FNP as Nurse Practitioner (Psychiatry) [name], MD as Consulting Physician (Radiology) [facility] as Consulting Physician (Ophthalmology)  Indicate any recent Medical Services you may have received from other than [facility] providers in the past year (date may be approximate).     Assessment:   This is a routine wellness examination for [name].  Hearing/Vision screen No results found.  Dietary issues and exercise activities discussed: Current Exercise Habits: The patient does not participate in regular exercise at present, Exercise limited by: None identified   Depression Screen    05/29/2022   11:44 AM 04/30/2022    2:59 PM 04/23/2022   10:08 AM 02/02/2022    9:47 AM 05/20/2021    9:42 AM 01/24/2021    3:53 PM 01/24/2021    3:33 PM  PHQ 2/9 Scores  PHQ - 2 Score 0 0 0 0 0 0 0  PHQ- 9 Score 0 0         Fall Risk    05/29/2022   11:46 AM 04/30/2022    3:00 PM 04/23/2022   10:08 AM 02/02/2022    9:47 AM 05/20/2021    9:42 AM  Fall Risk   Falls in the past year? _0 0 1  Number falls in past yr: 0 0 0 0 0  Injury with Fall? 0 1 1 0 0  Comment   admitted in hospital and rehab    Risk for fall due to : History of fall(s)    History of fall(s);Impaired balance/gait  Follow up     Falls evaluation completed    FALL RISK PREVENTION PERTAINING TO THE HOME:  Any stairs in or around the home? No  If so, are there any without handrails? No  Home free of loose throw rugs in walkways, pet beds, electrical cords, etc? Yes  Adequate lighting in your home to reduce risk of falls? Yes   ASSISTIVE DEVICES UTILIZED TO PREVENT FALLS:  Life alert? Yes  Use of a cane, walker or w/c? Yes  Grab bars in the bathroom? No  Shower chair or bench in shower? Yes  Elevated toilet seat or a handicapped toilet? Yes   TIMED UP AND GO:  Was the test performed? Yes .  Length of time to ambulate  10 feet:  sec.     Cognitive Function:       05/29/2022   11:45 AM  6CIT Screen  What Year? 0 points  What month? 0 points  What time? 0 points  Count back from 20 0 points  Months in reverse 0 points  Repeat phrase 0 points  Total Score 0 points    Immunizations Immunization History  Administered Date(s) Administered   Fluad Quad(high Dose 65+) 11/27/2020, 06/27/2021   Influenza Split 08/07/2011, 10/13/2012   Influenza Whole 07/27/2008, 08/14/2009, 07/01/2010   Influenza, High Dose Seasonal PF 07/10/2016, 08/10/2017, 08/30/2018, 09/08/2019   Influenza,inj,Quad PF,6+ Mos 08/28/2013, 07/18/2014, 07/26/2015   PFIZER(Purple Top)SARS-COV-2 Vaccination 12/11/2019, 01/02/2020   Pneumococcal Conjugate-13 02/20/2014   Pneumococcal Polysaccharide-23 10/26/2005   Tdap 11/22/2017    TDAP status: Up to date  Flu Vaccine status: Up to date  Pneumococcal vaccine status: Up to date  Covid-19 vaccine status: Information provided on how to obtain vaccines.   Qualifies  for Shingles Vaccine? Yes   Zostavax completed No   Shingrix Completed?: No.    Education has been provided regarding the importance of this vaccine. Patient has been advised to call insurance company to determine out of pocket expense if they have not yet received this vaccine. Advised may also receive vaccine at local pharmacy or Health Dept. Verbalized acceptance and understanding.  Screening Tests Health Maintenance  Topic Date Due   Zoster Vaccines- Shingrix (1 of 2) Never done   COVID-19 Vaccine (4 - Pfizer risk series) 12/29/2021   INFLUENZA VACCINE  05/26/2022   Diabetic kidney evaluation - GFR measurement  02/10/2023   Diabetic kidney evaluation - Urine ACR  03/10/2023   TETANUS/TDAP  11/23/2027   Pneumonia Vaccine 56+ Years old  Completed   DEXA SCAN  Completed   HPV VACCINES  Aged Out    Health Maintenance  Health Maintenance Due  Topic Date Due   Zoster Vaccines- Shingrix (1 of 2) Never done    COVID-19 Vaccine (4 - Pfizer risk series) 12/29/2021   INFLUENZA VACCINE  05/26/2022    Colorectal cancer screening: No longer required.   Mammogram status: No longer required due to age.    Lung Cancer Screening: (Low Dose CT Chest recommended if Age 83-80 years, 30 pack-year currently smoking OR have quit w/in 15years.) does not qualify.   Lung Cancer Screening Referral:   Additional Screening:  Hepatitis C Screening: does not qualify; Completed   Vision Screening: Recommended annual ophthalmology exams for early detection of glaucoma and other disorders of the eye. Is the patient up to date with their annual eye exam?  Yes  Who is the provider or what is the name of the office in which the patient attends annual eye exams? [facility] If pt is not established with a provider, would they like to be referred to a provider to establish care? Yes .   Dental Screening: Recommended annual dental exams for proper oral hygiene  Community Resource Referral / Chronic Care Management: CRR required this visit?  No   CCM required this visit?  No      Plan:     I have personally reviewed and noted the following in the patient's chart:   Medical and social history Use of alcohol, tobacco or illicit drugs  Current medications and supplements including opioid prescriptions.  Functional ability and status Nutritional status Physical activity Advanced directives List of other physicians Hospitalizations, surgeries, and ER visits in previous 12 months Vitals Screenings to include cognitive, depression, and falls Referrals and appointments  In addition, I have reviewed and discussed with patient certain preventive protocols, quality metrics, and best practice recommendations. A written personalized care plan for preventive services as well as general preventive health recommendations were provided to patient.    [name], [place]   05/29/2022 Henrene Dodge, RN   06/08/2022  Amended   Nurse Notes: No concerns, patient was advised that if anything changes to give our office a call.   I connected with  [name] on 06/08/22 by a video enabled telemedicine application and verified that I am speaking with the correct person using two identifiers.   I discussed the limitations of evaluation and management by telemedicine. The patient expressed understanding and agreed to proceed.

## 2022-06-11 DIAGNOSIS — K219 Gastro-esophageal reflux disease without esophagitis: Secondary | ICD-10-CM | POA: Diagnosis not present

## 2022-06-11 DIAGNOSIS — E1122 Type 2 diabetes mellitus with diabetic chronic kidney disease: Secondary | ICD-10-CM | POA: Diagnosis not present

## 2022-06-11 DIAGNOSIS — E261 Secondary hyperaldosteronism: Secondary | ICD-10-CM | POA: Diagnosis not present

## 2022-06-11 DIAGNOSIS — E669 Obesity, unspecified: Secondary | ICD-10-CM | POA: Diagnosis not present

## 2022-06-11 DIAGNOSIS — I130 Hypertensive heart and chronic kidney disease with heart failure and stage 1 through stage 4 chronic kidney disease, or unspecified chronic kidney disease: Secondary | ICD-10-CM | POA: Diagnosis not present

## 2022-06-11 DIAGNOSIS — M0550 Rheumatoid polyneuropathy with rheumatoid arthritis of unspecified site: Secondary | ICD-10-CM | POA: Diagnosis not present

## 2022-06-11 DIAGNOSIS — J45909 Unspecified asthma, uncomplicated: Secondary | ICD-10-CM | POA: Diagnosis not present

## 2022-06-11 DIAGNOSIS — E1142 Type 2 diabetes mellitus with diabetic polyneuropathy: Secondary | ICD-10-CM | POA: Diagnosis not present

## 2022-06-11 DIAGNOSIS — N184 Chronic kidney disease, stage 4 (severe): Secondary | ICD-10-CM | POA: Diagnosis not present

## 2022-06-11 DIAGNOSIS — I509 Heart failure, unspecified: Secondary | ICD-10-CM | POA: Diagnosis not present

## 2022-06-11 DIAGNOSIS — E1151 Type 2 diabetes mellitus with diabetic peripheral angiopathy without gangrene: Secondary | ICD-10-CM | POA: Diagnosis not present

## 2022-06-11 DIAGNOSIS — E790 Hyperuricemia without signs of inflammatory arthritis and tophaceous disease: Secondary | ICD-10-CM | POA: Diagnosis not present

## 2022-06-11 DIAGNOSIS — Z008 Encounter for other general examination: Secondary | ICD-10-CM | POA: Diagnosis not present

## 2022-06-12 ENCOUNTER — Telehealth: Payer: Self-pay

## 2022-06-12 ENCOUNTER — Telehealth: Payer: Self-pay | Admitting: Internal Medicine

## 2022-06-12 ENCOUNTER — Other Ambulatory Visit: Payer: Self-pay | Admitting: Physician Assistant

## 2022-06-12 NOTE — Telephone Encounter (Signed)
Patient called at 3 pm on Friday. Patient has requested a refill of Prednisone. She is almost out of her  current supply. Per patient she has pain in both shoulders and the right-sided pain.    Patient has been advised to reach out to her PCP or go to an Urgent Care facility, for pain relief.  However, the message will be sent to Dr. Ranell Patrick  who is not presently in the office.   Call back phone [phone].

## 2022-06-12 NOTE — Telephone Encounter (Signed)
Pt called and is requesting a refill on predniSONE (DELTASONE) 1 MG tablet. Pt states that she usually has the rx filled by Dr. Ranell Patrick but she is not in the office today and she will be out Monday as well. Pt states she is experiencing a lot of pain in both of her shoulders and really needs the rx. Pt would like a call letting her know if the refill will be approved by Dr. Jenny Reichmann or not.   Please advise

## 2022-06-13 ENCOUNTER — Other Ambulatory Visit: Payer: Self-pay | Admitting: Physical Medicine and Rehabilitation

## 2022-06-13 MED ORDER — PREDNISONE 1 MG PO TABS: 1.0000 mg | ORAL_TABLET | Freq: Every day | ORAL | 3 refills | Status: DC | PRN

## 2022-06-15 NOTE — Telephone Encounter (Signed)
No answer. Message left on voicemail.

## 2022-06-15 NOTE — Telephone Encounter (Signed)
Refilled by Dr.Raulkar on 8/19

## 2022-06-19 DIAGNOSIS — N1832 Chronic kidney disease, stage 3b: Secondary | ICD-10-CM | POA: Diagnosis not present

## 2022-06-19 DIAGNOSIS — R7303 Prediabetes: Secondary | ICD-10-CM | POA: Diagnosis not present

## 2022-06-19 DIAGNOSIS — R3129 Other microscopic hematuria: Secondary | ICD-10-CM | POA: Diagnosis not present

## 2022-06-19 DIAGNOSIS — I129 Hypertensive chronic kidney disease with stage 1 through stage 4 chronic kidney disease, or unspecified chronic kidney disease: Secondary | ICD-10-CM | POA: Diagnosis not present

## 2022-06-19 DIAGNOSIS — E871 Hypo-osmolality and hyponatremia: Secondary | ICD-10-CM | POA: Diagnosis not present

## 2022-06-19 DIAGNOSIS — R600 Localized edema: Secondary | ICD-10-CM | POA: Diagnosis not present

## 2022-06-19 DIAGNOSIS — N281 Cyst of kidney, acquired: Secondary | ICD-10-CM | POA: Diagnosis not present

## 2022-06-19 DIAGNOSIS — R809 Proteinuria, unspecified: Secondary | ICD-10-CM | POA: Diagnosis not present

## 2022-06-19 DIAGNOSIS — N179 Acute kidney failure, unspecified: Secondary | ICD-10-CM | POA: Diagnosis not present

## 2022-06-26 DIAGNOSIS — I5022 Chronic systolic (congestive) heart failure: Secondary | ICD-10-CM | POA: Diagnosis not present

## 2022-06-30 DIAGNOSIS — M19012 Primary osteoarthritis, left shoulder: Secondary | ICD-10-CM | POA: Diagnosis not present

## 2022-06-30 DIAGNOSIS — M25512 Pain in left shoulder: Secondary | ICD-10-CM | POA: Diagnosis not present

## 2022-06-30 DIAGNOSIS — M19011 Primary osteoarthritis, right shoulder: Secondary | ICD-10-CM | POA: Diagnosis not present

## 2022-06-30 DIAGNOSIS — I10 Essential (primary) hypertension: Secondary | ICD-10-CM | POA: Diagnosis not present

## 2022-06-30 DIAGNOSIS — Z0130 Encounter for examination of blood pressure without abnormal findings: Secondary | ICD-10-CM | POA: Diagnosis not present

## 2022-06-30 DIAGNOSIS — K219 Gastro-esophageal reflux disease without esophagitis: Secondary | ICD-10-CM | POA: Diagnosis not present

## 2022-06-30 DIAGNOSIS — M25449 Effusion, unspecified hand: Secondary | ICD-10-CM | POA: Diagnosis not present

## 2022-06-30 DIAGNOSIS — G8929 Other chronic pain: Secondary | ICD-10-CM | POA: Diagnosis not present

## 2022-06-30 DIAGNOSIS — M25511 Pain in right shoulder: Secondary | ICD-10-CM | POA: Diagnosis not present

## 2022-06-30 DIAGNOSIS — N184 Chronic kidney disease, stage 4 (severe): Secondary | ICD-10-CM | POA: Diagnosis not present

## 2022-06-30 DIAGNOSIS — M109 Gout, unspecified: Secondary | ICD-10-CM | POA: Diagnosis not present

## 2022-06-30 DIAGNOSIS — Z6830 Body mass index (BMI) 30.0-30.9, adult: Secondary | ICD-10-CM | POA: Diagnosis not present

## 2022-07-02 ENCOUNTER — Telehealth: Payer: Self-pay | Admitting: Internal Medicine

## 2022-07-02 DIAGNOSIS — R400 Somnolence: Secondary | ICD-10-CM

## 2022-07-02 DIAGNOSIS — R0683 Snoring: Secondary | ICD-10-CM

## 2022-07-02 NOTE — Telephone Encounter (Signed)
Patient would like a referral about her snoring - She discussed this with you in July - please advise patient when referral has been made

## 2022-07-02 NOTE — Telephone Encounter (Signed)
Ok this is done 

## 2022-07-06 DIAGNOSIS — M5416 Radiculopathy, lumbar region: Secondary | ICD-10-CM | POA: Diagnosis not present

## 2022-07-13 ENCOUNTER — Ambulatory Visit: Payer: Medicare HMO

## 2022-07-16 ENCOUNTER — Encounter: Payer: Self-pay | Admitting: Adult Health

## 2022-07-16 ENCOUNTER — Ambulatory Visit (INDEPENDENT_AMBULATORY_CARE_PROVIDER_SITE_OTHER): Payer: Medicare HMO | Admitting: Adult Health

## 2022-07-16 DIAGNOSIS — R0683 Snoring: Secondary | ICD-10-CM | POA: Diagnosis not present

## 2022-07-16 NOTE — Assessment & Plan Note (Signed)
Snoring and daytime sleepiness concerning for OSA  - discussed how weight can impact sleep and risk for sleep disordered breathing - discussed options to assist with weight loss: combination of diet modification, cardiovascular and strength training exercises   - had an extensive discussion regarding the adverse health consequences related to untreated sleep disordered breathing - specifically discussed the risks for hypertension, coronary artery disease, cardiac dysrhythmias, cerebrovascular disease, and diabetes - lifestyle modification discussed   - discussed how sleep disruption can increase risk of accidents, particularly when driving - safe driving practices were discussed    Set up for home sleep study   Plan  Patient Instructions  Set up for home sleep study  Healthy sleep regimen  Use caution with sedating medications  Follow up in 2 months to discuss test results and treatment plan .

## 2022-07-16 NOTE — Patient Instructions (Signed)
Set up for home sleep study  Healthy sleep regimen  Use caution with sedating medications  Follow up in 2 months to discuss test results and treatment plan .

## 2022-07-16 NOTE — Progress Notes (Signed)
$'@Patient'U$  ID: [name], female    DOB: 11-Aug-1936, 86 y.o.   MRN: [id number]  Chief Complaint  Patient presents with   Consult    Referring provider: Biagio Borg, MD  HPI: 86 year old female seen for sleep consult July 16, 2022 for snoring and  daytime sleepiness   TEST/EVENTS :   07/16/2022 Sleep consult  Patient presents for a sleep consult today kindly referred by primary care provider Dr. Jenny Reichmann patient is accompanied by her daughter.  Patient complains of snoring and daytime sleepiness.  She says that she has progressively been getting worse with significant sleepiness and feeling that she needs to lay down throughout the day.  She typically goes to bed about 9:30 PM.  Most of the time she falls right asleep.  Gets up a few times throughout the night.  And typically wakes up about 7 to 8 AM.  She has never had a sleep study before.  No previous diagnosis.  No symptoms suspicious for cataplexy or sleep paralysis.  Epworth score is 13 out of 24.  Typically gets sleepy if she sits down to watch TV or in the afternoon hours.  No history of stroke.  Does not take any sleep aids.  Caffeine intake -drinks 1 cup of tea  She is prone if she sits down will doze off. History of DCHF , preserved EF.  Upper and lower dentures.   Medical history significant for diastolic heart failure,  history of DVT, asthma, GERD, allergic rhinitis, chronic kidney disease, overactive bladder, history of breast cancer (lumpectomy only )  anemia, DJD , preDM    Social history patient is widowed.  She lives with her daughter.  She is retired.  She does have 1 adult children.  She is a never smoker, no alcohol or drug use.  Family history positive for cancer  Surgical history positive for hysterectomy and previous breast surgery, knee replacements bilaterally .    Allergies  Allergen Reactions   Hydrocodone Itching   Lasix [Furosemide] Other (See Comments)    Dizziness.    Tizanidine Other  (See Comments)    Dizzy and fall   Iron Hives and Other (See Comments)     bad constipation     Immunization History  Administered Date(s) Administered   Fluad Quad(high Dose 65+) 11/27/2020, 06/27/2021   Influenza Split 08/07/2011, 10/13/2012   Influenza Whole 07/27/2008, 08/14/2009, 07/01/2010   Influenza, High Dose Seasonal PF 07/10/2016, 08/10/2017, 08/30/2018, 09/08/2019   Influenza,inj,Quad PF,6+ Mos 08/28/2013, 07/18/2014, 07/26/2015   PFIZER(Purple Top)SARS-COV-2 Vaccination 12/11/2019, 01/02/2020   Pneumococcal Conjugate-13 02/20/2014   Pneumococcal Polysaccharide-23 10/26/2005   Tdap 11/22/2017    Past Medical History:  Diagnosis Date   Allergic rhinitis 10/30/2016   Anemia    Asthma    Breast cancer of upper-outer quadrant of left female breast ([place]) 11/08/2013   ER/PR+ Her2- Left IDC    Chronic renal insufficiency    Chronic rhinitis    Colon polyp    Diastolic dysfunction 10/14/7587   DJD (degenerative joint disease)    Dyspnea    Frozen shoulder    Full dentures    GERD (gastroesophageal reflux disease)    Hearing loss    Hypertension    Hyponatremia    Impaired glucose tolerance 07/18/2014   Memory loss    Morbid obesity ([place])    Poor circulation    Vertigo    Wears glasses     Tobacco History: Social History   Tobacco Use  Smoking Status Never   Passive exposure: Never  Smokeless Tobacco Never   Counseling given: Not Answered   Outpatient Medications Prior to Visit  Medication Sig Dispense Refill   ADVAIR DISKUS 250-50 MCG/ACT AEPB INHALE 1 PUFF INTO THE LUNGS IN THE MORNING AND AT BEDTIME. 60 each 11   albuterol (VENTOLIN HFA) 108 (90 Base) MCG/ACT inhaler Inhale 1 puff into the lungs every 6 (six) hours as needed for wheezing or shortness of breath.     allopurinol (ZYLOPRIM) 100 MG tablet Take 100 mg by mouth daily.     amLODipine (NORVASC) 5 MG tablet TAKE 1 TABLET BY MOUTH EVERY DAY (Patient taking differently: Take 5 mg by mouth  every morning.) 90 tablet 3   aspirin EC 81 MG tablet Take 81 mg by mouth every morning. Swallow whole.     Cholecalciferol (VITAMIN D3) 20 MCG (800 UNIT) TABS Take 1 tablet by mouth daily.     diclofenac Sodium (VOLTAREN) 1 % GEL Inhale 1 puff into the lungs 2 (two) times daily.     diclofenac Sodium (VOLTAREN) 1 % GEL APPLY 2 GRAMS TO AFFECTED AREA 4 TIMES A DAY 100 g 5   Ensure (ENSURE) Take 1 Can by mouth 3 (three) times daily between meals. (Patient taking differently: Take 237 mLs by mouth See admin instructions. Drink one can (237 mls) by mouth once or twice daily) 237 mL 12   FARXIGA 10 MG TABS tablet Take 10 mg by mouth daily.     febuxostat (ULORIC) 40 MG tablet Take 40 mg by mouth daily.     furosemide (LASIX) 40 MG tablet Take 1 tablet (40 mg total) by mouth every Monday, Wednesday, and Friday.     gabapentin (NEURONTIN) 300 MG capsule TAKE 1 CAPSULE BY MOUTH THREE TIMES A DAY 90 capsule 3   Incontinence Supply Disposable (DEPEND UNDERWEAR SM/MED) MISC Use as directed four times per day 120 each 5   losartan (COZAAR) 100 MG tablet Take 100 mg by mouth daily.     magnesium gluconate (MAGONATE) 500 MG tablet TAKE 1 TABLET BY MOUTH TWICE A DAY 180 tablet 1   meclizine (ANTIVERT) 12.5 MG tablet TAKE 1 TABLET BY MOUTH THREE TIMES A DAY AS NEEDED FOR DIZZINESS (Patient taking differently: Take 12.5 mg by mouth 3 (three) times daily as needed for dizziness.) 90 tablet 2   montelukast (SINGULAIR) 10 MG tablet TAKE 1 TABLET BY MOUTH EVERY DAY 90 tablet 2   Multiple Vitamin (MULTIVITAMIN WITH MINERALS) TABS tablet Take 1 tablet by mouth daily. Centrum Silver     pantoprazole (PROTONIX) 40 MG tablet TAKE 1 TABLET BY MOUTH EVERY DAY 90 tablet 3   potassium chloride (KLOR-CON) 10 MEQ tablet TAKE 1 TABLET BY MOUTH EVERY DAY WHEN TAKING FUROSEMIDE (Patient taking differently: Take 10 mEq by mouth every Monday, Wednesday, and Friday.) 90 tablet 3   predniSONE (DELTASONE) 1 MG tablet Take 1 tablet (1 mg  total) by mouth daily as needed. 90 tablet 3   triamcinolone cream (KENALOG) 0.1 % APPLY TO AFFECTED AREA TWICE A DAY (Patient taking differently: Apply 1 application  topically 2 (two) times daily as needed (rash/irritation).) 30 g 1   RYBELSUS 3 MG TABS Take 1 tablet by mouth every morning. (Patient not taking: Reported on 07/16/2022)     No facility-administered medications prior to visit.     Review of Systems:   Constitutional:   No  weight loss, night sweats,  Fevers, chills, +fatigue, or  lassitude.  HEENT:   No headaches,  Difficulty swallowing,  Tooth/dental problems, or  Sore throat,                No sneezing, itching, ear ache, nasal congestion, post nasal drip,   CV:  No chest pain,  Orthopnea, PND, swelling in lower extremities, anasarca, dizziness, palpitations, syncope.   GI  No heartburn, indigestion, abdominal pain, nausea, vomiting, diarrhea, change in bowel habits, loss of appetite, bloody stools.   Resp: No shortness of breath with exertion or at rest.  No excess mucus, no productive cough,  No non-productive cough,  No coughing up of blood.  No change in color of mucus.  No wheezing.  No chest wall deformity  Skin: no rash or lesions.  GU: no dysuria, change in color of urine, no urgency or frequency.  No flank pain, no hematuria   MS:  + joint pain     Physical Exam  BP 128/64 (BP Location: Right Arm, Patient Position: Sitting, Cuff Size: Normal)   Pulse 76   Temp 98.3 F (36.8 C) (Oral)   Ht $R'5\' 1"'ue$  (1.549 m)   Wt 151 lb 12.8 oz (68.9 kg)   SpO2 97%   BMI 28.68 kg/m   GEN: A/Ox3; pleasant , NAD, well nourished    HEENT:  [place]/AT,  NOSE-clear, THROAT-clear, no lesions, no postnasal drip or exudate noted. Class 3 MP airway   NECK:  Supple w/ fair ROM; no JVD; normal carotid impulses w/o bruits; no thyromegaly or nodules palpated; no lymphadenopathy.    RESP  Clear  P & A; w/o, wheezes/ rales/ or rhonchi. no accessory muscle use, no dullness to  percussion  CARD:  RRR, no m/r/g, tr-1 peripheral edema, pulses intact, no cyanosis or clubbing.  GI:   Soft & nt; nml bowel sounds; no organomegaly or masses detected.   Musco: Warm bil, no deformities or joint swelling noted.   Neuro: alert, no focal deficits noted.    Skin: Warm, no lesions or rashes      BMET   BNP   Imaging: No results found.        No data to display          No results found for: "NITRICOXIDE"      Assessment & Plan:   Snoring Snoring and daytime sleepiness concerning for OSA  - discussed how weight can impact sleep and risk for sleep disordered breathing - discussed options to assist with weight loss: combination of diet modification, cardiovascular and strength training exercises   - had an extensive discussion regarding the adverse health consequences related to untreated sleep disordered breathing - specifically discussed the risks for hypertension, coronary artery disease, cardiac dysrhythmias, cerebrovascular disease, and diabetes - lifestyle modification discussed   - discussed how sleep disruption can increase risk of accidents, particularly when driving - safe driving practices were discussed    Set up for home sleep study   Plan  Patient Instructions  Set up for home sleep study  Healthy sleep regimen  Use caution with sedating medications  Follow up in 2 months to discuss test results and treatment plan .         Rexene Edison, NP 07/16/2022

## 2022-07-17 NOTE — Progress Notes (Signed)
Reviewed and agree with assessment/plan.   Chesley Mires, MD [facility] 07/17/2022, 7:10 AM Pager:  [phone]

## 2022-07-24 ENCOUNTER — Encounter: Payer: Medicare HMO | Attending: Physical Medicine and Rehabilitation | Admitting: Physical Medicine and Rehabilitation

## 2022-07-30 ENCOUNTER — Ambulatory Visit: Payer: Medicare HMO | Admitting: Physical Medicine and Rehabilitation

## 2022-07-30 DIAGNOSIS — M25511 Pain in right shoulder: Secondary | ICD-10-CM | POA: Diagnosis not present

## 2022-07-30 DIAGNOSIS — M19011 Primary osteoarthritis, right shoulder: Secondary | ICD-10-CM | POA: Diagnosis not present

## 2022-07-30 DIAGNOSIS — Z79899 Other long term (current) drug therapy: Secondary | ICD-10-CM | POA: Diagnosis not present

## 2022-07-30 DIAGNOSIS — Z0130 Encounter for examination of blood pressure without abnormal findings: Secondary | ICD-10-CM | POA: Diagnosis not present

## 2022-07-30 DIAGNOSIS — E7800 Pure hypercholesterolemia, unspecified: Secondary | ICD-10-CM | POA: Diagnosis not present

## 2022-07-30 DIAGNOSIS — R5383 Other fatigue: Secondary | ICD-10-CM | POA: Diagnosis not present

## 2022-07-30 DIAGNOSIS — M25512 Pain in left shoulder: Secondary | ICD-10-CM | POA: Diagnosis not present

## 2022-07-30 DIAGNOSIS — I10 Essential (primary) hypertension: Secondary | ICD-10-CM | POA: Diagnosis not present

## 2022-07-30 DIAGNOSIS — M25449 Effusion, unspecified hand: Secondary | ICD-10-CM | POA: Diagnosis not present

## 2022-07-30 DIAGNOSIS — Z6830 Body mass index (BMI) 30.0-30.9, adult: Secondary | ICD-10-CM | POA: Diagnosis not present

## 2022-07-30 DIAGNOSIS — M19012 Primary osteoarthritis, left shoulder: Secondary | ICD-10-CM | POA: Diagnosis not present

## 2022-07-30 DIAGNOSIS — R7303 Prediabetes: Secondary | ICD-10-CM | POA: Diagnosis not present

## 2022-07-30 DIAGNOSIS — M109 Gout, unspecified: Secondary | ICD-10-CM | POA: Diagnosis not present

## 2022-07-31 ENCOUNTER — Other Ambulatory Visit: Payer: Self-pay | Admitting: Physical Medicine and Rehabilitation

## 2022-08-22 ENCOUNTER — Other Ambulatory Visit: Payer: Self-pay | Admitting: Physician Assistant

## 2022-08-24 DIAGNOSIS — N1832 Chronic kidney disease, stage 3b: Secondary | ICD-10-CM | POA: Diagnosis not present

## 2022-08-31 ENCOUNTER — Other Ambulatory Visit: Payer: Self-pay | Admitting: Nephrology

## 2022-08-31 DIAGNOSIS — I129 Hypertensive chronic kidney disease with stage 1 through stage 4 chronic kidney disease, or unspecified chronic kidney disease: Secondary | ICD-10-CM

## 2022-08-31 DIAGNOSIS — D631 Anemia in chronic kidney disease: Secondary | ICD-10-CM

## 2022-08-31 DIAGNOSIS — N184 Chronic kidney disease, stage 4 (severe): Secondary | ICD-10-CM

## 2022-08-31 DIAGNOSIS — R7303 Prediabetes: Secondary | ICD-10-CM

## 2022-08-31 DIAGNOSIS — R3129 Other microscopic hematuria: Secondary | ICD-10-CM | POA: Diagnosis not present

## 2022-08-31 DIAGNOSIS — R809 Proteinuria, unspecified: Secondary | ICD-10-CM | POA: Diagnosis not present

## 2022-08-31 DIAGNOSIS — R600 Localized edema: Secondary | ICD-10-CM

## 2022-08-31 DIAGNOSIS — N281 Cyst of kidney, acquired: Secondary | ICD-10-CM

## 2022-08-31 DIAGNOSIS — E871 Hypo-osmolality and hyponatremia: Secondary | ICD-10-CM

## 2022-08-31 DIAGNOSIS — N189 Chronic kidney disease, unspecified: Secondary | ICD-10-CM

## 2022-09-04 ENCOUNTER — Ambulatory Visit
Admission: RE | Admit: 2022-09-04 | Discharge: 2022-09-04 | Disposition: A | Discharge status: Home / Self Care | Payer: Medicare HMO | Source: Ambulatory Visit | Attending: Nephrology | Admitting: Nephrology

## 2022-09-04 DIAGNOSIS — R809 Proteinuria, unspecified: Secondary | ICD-10-CM

## 2022-09-04 DIAGNOSIS — M549 Dorsalgia, unspecified: Secondary | ICD-10-CM | POA: Diagnosis not present

## 2022-09-04 DIAGNOSIS — M19012 Primary osteoarthritis, left shoulder: Secondary | ICD-10-CM | POA: Diagnosis not present

## 2022-09-04 DIAGNOSIS — I129 Hypertensive chronic kidney disease with stage 1 through stage 4 chronic kidney disease, or unspecified chronic kidney disease: Secondary | ICD-10-CM

## 2022-09-04 DIAGNOSIS — Z0130 Encounter for examination of blood pressure without abnormal findings: Secondary | ICD-10-CM | POA: Diagnosis not present

## 2022-09-04 DIAGNOSIS — R3129 Other microscopic hematuria: Secondary | ICD-10-CM

## 2022-09-04 DIAGNOSIS — N184 Chronic kidney disease, stage 4 (severe): Secondary | ICD-10-CM

## 2022-09-04 DIAGNOSIS — E871 Hypo-osmolality and hyponatremia: Secondary | ICD-10-CM

## 2022-09-04 DIAGNOSIS — R7303 Prediabetes: Secondary | ICD-10-CM | POA: Diagnosis not present

## 2022-09-04 DIAGNOSIS — M19011 Primary osteoarthritis, right shoulder: Secondary | ICD-10-CM | POA: Diagnosis not present

## 2022-09-04 DIAGNOSIS — N281 Cyst of kidney, acquired: Secondary | ICD-10-CM

## 2022-09-04 DIAGNOSIS — Z6830 Body mass index (BMI) 30.0-30.9, adult: Secondary | ICD-10-CM | POA: Diagnosis not present

## 2022-09-04 DIAGNOSIS — N189 Chronic kidney disease, unspecified: Secondary | ICD-10-CM | POA: Diagnosis not present

## 2022-09-04 DIAGNOSIS — R600 Localized edema: Secondary | ICD-10-CM

## 2022-09-04 DIAGNOSIS — M25511 Pain in right shoulder: Secondary | ICD-10-CM | POA: Diagnosis not present

## 2022-09-04 DIAGNOSIS — M109 Gout, unspecified: Secondary | ICD-10-CM | POA: Diagnosis not present

## 2022-09-04 DIAGNOSIS — E7800 Pure hypercholesterolemia, unspecified: Secondary | ICD-10-CM | POA: Diagnosis not present

## 2022-09-04 DIAGNOSIS — D631 Anemia in chronic kidney disease: Secondary | ICD-10-CM

## 2022-09-04 DIAGNOSIS — I10 Essential (primary) hypertension: Secondary | ICD-10-CM | POA: Diagnosis not present

## 2022-09-10 ENCOUNTER — Telehealth: Payer: Self-pay | Admitting: Internal Medicine

## 2022-09-10 NOTE — Telephone Encounter (Signed)
Patient called and wanted to know if Dr Jenny Reichmann would like her to get the shingles shot. She said she wants to remind him that she is the one with the frozen shoulders. Call back is [phone]

## 2022-09-10 NOTE — Telephone Encounter (Signed)
Please advise if shingles vaccine is recommended.

## 2022-09-10 NOTE — Telephone Encounter (Signed)
Yes, that is still a good idea, remember it is 2 shots (now , then repeat in 2 mo), and Free at the pharmacies

## 2022-09-11 ENCOUNTER — Other Ambulatory Visit: Payer: Self-pay | Admitting: Internal Medicine

## 2022-09-11 NOTE — Telephone Encounter (Signed)
Left message for a call back regarding shingles vaccine.

## 2022-09-15 ENCOUNTER — Ambulatory Visit: Payer: Medicare HMO | Admitting: Adult Health

## 2022-09-25 ENCOUNTER — Other Ambulatory Visit: Payer: Self-pay | Admitting: Physician Assistant

## 2022-09-25 ENCOUNTER — Other Ambulatory Visit: Payer: Self-pay | Admitting: Internal Medicine

## 2022-09-29 ENCOUNTER — Ambulatory Visit (INDEPENDENT_AMBULATORY_CARE_PROVIDER_SITE_OTHER): Payer: Medicare HMO | Admitting: Internal Medicine

## 2022-09-29 ENCOUNTER — Ambulatory Visit (INDEPENDENT_AMBULATORY_CARE_PROVIDER_SITE_OTHER): Payer: Medicare HMO

## 2022-09-29 ENCOUNTER — Other Ambulatory Visit: Payer: Self-pay | Admitting: Internal Medicine

## 2022-09-29 ENCOUNTER — Ambulatory Visit: Payer: Medicare HMO | Admitting: Family Medicine

## 2022-09-29 VITALS — BP 122/64 | HR 75 | Ht 61.0 in | Wt 158.0 lb

## 2022-09-29 VITALS — BP 122/64 | HR 75 | Temp 97.6°F | Ht 61.0 in | Wt 158.0 lb

## 2022-09-29 DIAGNOSIS — M79604 Pain in right leg: Secondary | ICD-10-CM

## 2022-09-29 DIAGNOSIS — R7303 Prediabetes: Secondary | ICD-10-CM | POA: Diagnosis not present

## 2022-09-29 DIAGNOSIS — M353 Polymyalgia rheumatica: Secondary | ICD-10-CM

## 2022-09-29 DIAGNOSIS — I10 Essential (primary) hypertension: Secondary | ICD-10-CM

## 2022-09-29 DIAGNOSIS — M48061 Spinal stenosis, lumbar region without neurogenic claudication: Secondary | ICD-10-CM | POA: Insufficient documentation

## 2022-09-29 DIAGNOSIS — M419 Scoliosis, unspecified: Secondary | ICD-10-CM | POA: Insufficient documentation

## 2022-09-29 DIAGNOSIS — M25551 Pain in right hip: Secondary | ICD-10-CM

## 2022-09-29 DIAGNOSIS — M160 Bilateral primary osteoarthritis of hip: Secondary | ICD-10-CM | POA: Diagnosis not present

## 2022-09-29 DIAGNOSIS — R7302 Impaired glucose tolerance (oral): Secondary | ICD-10-CM

## 2022-09-29 LAB — POCT GLYCOSYLATED HEMOGLOBIN (HGB A1C): Hemoglobin A1C: 5.7 % — AB (ref 4.0–5.6)

## 2022-09-29 MED ORDER — PREDNISONE 1 MG PO TABS: 1.0000 mg | ORAL_TABLET | Freq: Every day | ORAL | 3 refills | Status: DC | PRN

## 2022-09-29 NOTE — Patient Instructions (Addendum)
Ok to stop the rybelsus  Your A1c was done today  Please have your Shingrix (shingles) shots done at your local pharmacy.  Please continue all other medications as before, and refills have been done if requested - the prednisone  Please have the pharmacy call with any other refills you may need.  Please continue your efforts at being more active, low cholesterol diet, and weight control.  Please keep your appointments with your specialists as you may have planned - renal doctor  You will be contacted regarding the referral for: Sports Medicine  Please go to the XRAY Department in the first floor for the x-ray testing  /You will be contacted by phone if any changes need to be made immediately.  Otherwise, you will receive a letter about your results with an explanation, but please check with MyChart first.  Please remember to sign up for MyChart if you have not done so, as this will be important to you in the future with finding out test results, communicating by private email, and scheduling acute appointments online when needed.  Please make an Appointment to return in 6 months, or sooner if needed

## 2022-09-29 NOTE — Progress Notes (Signed)
   I, Peterson Lombard, LAT, ATC acting as a scribe for Lynne Leader, MD.  [name] is a 86 y.o. female who presents to [place] at [facility] today for R leg pain. She has chronic polyarthralgia pain that she previously was being treated for by her rheumatologist with chronic steroids.  Pt was previously seen by Dr. Georgina Snell for chronic pain syndrome and bilat shoulder pain. Today, pt c/o R leg pain x about 1 year. Pt locates pain to the lateral aspect of her R thigh.  The pain is worse with prolonged standing.  She has used home health physical therapy in the past with [facility]  Radiating pain: no LE numbness/tingling: no LE weakness: no Aggravates: standing, activity Treatments tried: exercise, walking, prednisone, gabapentin, Tylenol   Pertinent review of systems: No fevers or chills  Relevant historical information: Heart failure.  Bilateral shoulder arthritis.   Exam:  BP 122/64   Pulse 75   Ht '5\' 1"'$  (1.549 m)   Wt 158 lb (71.7 kg)   SpO2 94%   BMI 29.85 kg/m  General: Well Developed, well nourished, and in no acute distress.   MSK: Right hip normal-appearing Tender palpation at greater trochanter.  Normal hip motion.  Hip abduction strength slightly diminished. Lateral thigh nontender.    Lab and Radiology Results  X-ray images right hip obtained today personally and independently interpreted. Mild hip arthritis.  No acute fractures. Await formal radiology review   Assessment and Plan: 86 y.o. female with right lateral thigh pain pain could be either hip abductor tendinopathy/trochanteric bursitis or perhaps meralgia paresthetica.  The bursitis is much more likely.  Plan to treat for that with trial of home health physical therapy.  I offered a steroid injection but she declined.  Will check back in the future if not improved would consider injection.  As for the meralgia paresthetica if needed the best way to evaluate that would be a  diagnostic injection in clinic with ultrasound guidance.   PDMP not reviewed this encounter. Orders Placed This Encounter  Procedures   Ambulatory referral to Home Health    Referral Priority:   Routine    Referral Type:   Home Health Care    Referral Reason:   Specialty Services Required    Requested Specialty:   [place]    Number of Visits Requested:   1   No orders of the defined types were placed in this encounter.    Discussed warning signs or symptoms. Please see discharge instructions. Patient expresses understanding.   The above documentation has been reviewed and is accurate and complete Lynne Leader, M.D.

## 2022-09-29 NOTE — Progress Notes (Signed)
Patient ID: [name], female   DOB: [birth year], 86 y.o.   MRN: [id number]        Chief Complaint: follow up PMR, right lateral upper leg pain x 1 yr, hyeprglycemia, htn       HPI:  [name] is a 86 y.o. female here overall doing ok, but in danger or running out of very low dose chronic prednisone 1 mg and is adamant she needs this to avoid bilateral shoulder and other pain.   State she is just miserable without it, and understands the long term effects, to include possible future bone fractures.  Pt denies chest pain, increased sob or doe, wheezing, orthopnea, PND, increased LE swelling, palpitations, dizziness or syncope.   Pt denies polydipsia, polyuria, or new focal neuro s/s.   Pt denies fever, wt loss, night sweats, loss of appetite, or other constitutional symptoms   Saw renal < 1 mo with labs stable per daughter.    Also has c/o 1 yr mild to mod right lateral upper leg pain and soreness, worse to lie on right side, not better with anything.  Denies worsening pain with walking and no LBP currently.  Due for shingrix Wt Readings from Last 3 Encounters:  09/29/22 158 lb (71.7 kg)  09/29/22 158 lb (71.7 kg)  07/16/22 151 lb 12.8 oz (68.9 kg)   BP Readings from Last 3 Encounters:  09/29/22 122/64  09/29/22 122/64  07/16/22 128/64         Past Medical History:  Diagnosis Date   Allergic rhinitis 10/30/2016   Anemia    Asthma    Breast cancer of upper-outer quadrant of left female breast ([place]) 11/08/2013   ER/PR+ Her2- Left IDC    Chronic renal insufficiency    Chronic rhinitis    Colon polyp    Diastolic dysfunction 12/87/8676   DJD (degenerative joint disease)    Dyspnea    Frozen shoulder    Full dentures    GERD (gastroesophageal reflux disease)    Hearing loss    Hypertension    Hyponatremia    Impaired glucose tolerance 07/18/2014   Memory loss    Morbid obesity ([place])    Poor circulation    Vertigo    Wears glasses    Past Surgical History:   Procedure Laterality Date   ABDOMINAL HYSTERECTOMY     BREAST LUMPECTOMY WITH NEEDLE LOCALIZATION AND AXILLARY SENTINEL LYMPH NODE BX Left 12/04/2013   Procedure: BREAST LUMPECTOMY WITH NEEDLE LOCALIZATION AND AXILLARY SENTINEL LYMPH NODE BX;  Surgeon: Shann Medal, MD;  Location: [place];  Service: General;  Laterality: Left;   CATARACT EXTRACTION  2009   rt   COLONOSCOPY     EYE SURGERY Bilateral    cataract surgery   KNEE ARTHROSCOPY     both   LUMBAR LAMINECTOMY/DECOMPRESSION MICRODISCECTOMY Left 12/23/2017   Procedure: Left Lumbar One-Two Laminectomy with microdiscectomy;  Surgeon: Eustace Moore, MD;  Location: [place];  Service: Neurosurgery;  Laterality: Left;  Left L1-2 Laminectomy with microdiscectomy   TONSILLECTOMY     TOTAL KNEE ARTHROPLASTY  2002   rt   TOTAL KNEE ARTHROPLASTY  2003   left   VESICOVAGINAL FISTULA CLOSURE W/ TAH  1980    reports that she has never smoked. She has never been exposed to tobacco smoke. She has never used smokeless tobacco. She reports that she does not drink alcohol and does not use drugs. family history includes Colon cancer in her  mother; Healthy in her daughter; Heart attack (age of onset: 94) in her son; Lung cancer in her brother; Stomach cancer in her mother. Allergies  Allergen Reactions   Hydrocodone Itching   Lasix [Furosemide] Other (See Comments)    Dizziness.    Tizanidine Other (See Comments)    Dizzy and fall   Iron Hives and Other (See Comments)     bad constipation    Current Outpatient Medications on File Prior to Visit  Medication Sig Dispense Refill   ADVAIR DISKUS 250-50 MCG/ACT AEPB INHALE 1 PUFF INTO THE LUNGS IN THE MORNING AND AT BEDTIME. 60 each 11   albuterol (VENTOLIN HFA) 108 (90 Base) MCG/ACT inhaler Inhale 1 puff into the lungs every 6 (six) hours as needed for wheezing or shortness of breath.     allopurinol (ZYLOPRIM) 100 MG tablet Take 100 mg by mouth daily.     amLODipine (NORVASC) 5  MG tablet TAKE 1 TABLET BY MOUTH EVERY DAY (Patient taking differently: Take 5 mg by mouth every morning.) 90 tablet 3   aspirin EC 81 MG tablet Take 81 mg by mouth every morning. Swallow whole.     Cholecalciferol (VITAMIN D3) 20 MCG (800 UNIT) TABS Take 1 tablet by mouth daily.     diclofenac Sodium (VOLTAREN) 1 % GEL Inhale 1 puff into the lungs 2 (two) times daily.     diclofenac Sodium (VOLTAREN) 1 % GEL APPLY 2 GRAMS TO AFFECTED AREA 4 TIMES A DAY 100 g 5   Ensure (ENSURE) Take 1 Can by mouth 3 (three) times daily between meals. (Patient taking differently: Take 237 mLs by mouth See admin instructions. Drink one can (237 mls) by mouth once or twice daily) 237 mL 12   FARXIGA 10 MG TABS tablet Take 10 mg by mouth daily.     febuxostat (ULORIC) 40 MG tablet Take 40 mg by mouth daily.     furosemide (LASIX) 40 MG tablet Take 1 tablet (40 mg total) by mouth every Monday, Wednesday, and Friday.     gabapentin (NEURONTIN) 300 MG capsule TAKE 1 CAPSULE BY MOUTH THREE TIMES A DAY 90 capsule 5   Incontinence Supply Disposable (DEPEND UNDERWEAR SM/MED) MISC Use as directed four times per day 120 each 5   losartan (COZAAR) 100 MG tablet Take 100 mg by mouth daily.     magnesium gluconate (MAGONATE) 500 MG tablet TAKE 1 TABLET BY MOUTH TWICE A DAY 180 tablet 1   meclizine (ANTIVERT) 12.5 MG tablet TAKE 1 TABLET BY MOUTH THREE TIMES A DAY AS NEEDED FOR DIZZINESS (Patient taking differently: Take 12.5 mg by mouth 3 (three) times daily as needed for dizziness.) 90 tablet 2   montelukast (SINGULAIR) 10 MG tablet TAKE 1 TABLET BY MOUTH EVERY DAY 90 tablet 2   Multiple Vitamin (MULTIVITAMIN WITH MINERALS) TABS tablet Take 1 tablet by mouth daily. Centrum Silver     pantoprazole (PROTONIX) 40 MG tablet TAKE 1 TABLET BY MOUTH EVERY DAY 90 tablet 3   potassium chloride (KLOR-CON) 10 MEQ tablet TAKE 1 TABLET BY MOUTH EVERY DAY WHEN TAKING FUROSEMIDE (Patient taking differently: Take 10 mEq by mouth every Monday,  Wednesday, and Friday.) 90 tablet 3   triamcinolone cream (KENALOG) 0.1 % APPLY TO AFFECTED AREA TWICE A DAY 30 g 1   No current facility-administered medications on file prior to visit.        ROS:  All others reviewed and negative.  Objective  PE:  BP 122/64 (BP Location: Right Arm, Patient Position: Sitting, Cuff Size: Large)   Pulse 75   Temp 97.6 F (36.4 C) (Oral)   Ht _0  (1.549 m)   Wt 158 lb (71.7 kg)   SpO2 94%   BMI 29.85 kg/m                 Constitutional: Pt appears in NAD               HENT: Head: NCAT.                Right Ear: External ear normal.                 Left Ear: External ear normal.                Eyes: . Pupils are equal, round, and reactive to light. Conjunctivae and EOM are normal               Nose: without d/c or deformity               Neck: Neck supple. Gross normal ROM               Cardiovascular: Normal rate and regular rhythm.                 Pulmonary/Chest: Effort normal and breath sounds without rales or wheezing.                Abd:  Soft, NT, ND, + BS, no organomegaly               Neurological: Pt is alert. At baseline orientation, motor grossly intact               Skin: Skin is warm. No rashes, no other new lesions, LE edema - none               Right upper leg nontender to palpate including over the greater trochanter               Psychiatric: Pt behavior is normal without agitation   Micro: none  Cardiac tracings I have personally interpreted today:  none  Pertinent Radiological findings (summarize): none   Lab Results  Component Value Date   WBC 8.7 02/02/2022   HGB 12.3 02/02/2022   HCT 37.2 02/02/2022   PLT 234.0 02/02/2022   GLUCOSE 163 (H) 02/09/2022   CHOL 246 (H) 02/02/2022   TRIG 214.0 (H) 02/02/2022   HDL 85.20 02/02/2022   LDLDIRECT 137.0 02/02/2022   LDLCALC 111 (H) 01/24/2021   ALT 32 02/02/2022   AST 22 02/02/2022   NA 144 02/09/2022   K 4.0 02/09/2022   CL 108 02/09/2022   CREATININE 1.46  (H) 02/09/2022   BUN 40 (H) 02/09/2022   CO2 28 02/09/2022   TSH 1.46 02/02/2022   INR 1.0 07/27/2021   HGBA1C 5.7 (A) 09/29/2022   Assessment/Plan:  Kaziah KAMREE WIENS is a 86 y.o. Black or African American [2] female with  has a past medical history of Allergic rhinitis (10/30/2016), Anemia, Asthma, Breast cancer of upper-outer quadrant of left female breast ([place]) (11/08/2013), Chronic renal insufficiency, Chronic rhinitis, Colon polyp, Diastolic dysfunction (87/86/7672), DJD (degenerative joint disease), Dyspnea, Frozen shoulder, Full dentures, GERD (gastroesophageal reflux disease), Hearing loss, Hypertension, Hyponatremia, Impaired glucose tolerance (07/18/2014), Memory loss, Morbid obesity ([place]), Poor circulation, Vertigo, and Wears glasses.  Essential hypertension BP Readings from Last 3 Encounters:  09/29/22  122/64  09/29/22 122/64  07/16/22 128/64   Stable, pt to continue medical treatment norvasc 5 qd, losartan 100 mg qd   Polymyalgia rheumatica (HCC) Currently stable, but pt adamant she is miserable in quality of life now due to pain without it, ok for refill prednisone 1 mg qd, pt understands further long term risks including bone fracture  Impaired glucose tolerance Lab Results  Component Value Date   HGBA1C 5.7 (A) 09/29/2022   Stable, pt to continue current medical treatment farxiga 10 mg qd   Right leg pain Etiology unclear, suspect msk but not worse to palpate or with walking; ok for right femur and hip films, also refer sport med  Followup: Return in about 6 months (around 03/31/2023).  Cathlean Cower, MD 10/03/2022 11:10 AM [facility]

## 2022-09-29 NOTE — Patient Instructions (Signed)
Thank you for coming in today.   I've referred you to Physical Therapy.  Let us know if you don't hear from them in one week.   Recheck in 6 weeks.   Ok to return sooner if needed.   I can do an injection any time.

## 2022-09-30 ENCOUNTER — Encounter: Payer: Self-pay | Admitting: Internal Medicine

## 2022-10-02 DIAGNOSIS — E876 Hypokalemia: Secondary | ICD-10-CM | POA: Diagnosis not present

## 2022-10-02 DIAGNOSIS — M19041 Primary osteoarthritis, right hand: Secondary | ICD-10-CM | POA: Diagnosis not present

## 2022-10-02 DIAGNOSIS — I130 Hypertensive heart and chronic kidney disease with heart failure and stage 1 through stage 4 chronic kidney disease, or unspecified chronic kidney disease: Secondary | ICD-10-CM | POA: Diagnosis not present

## 2022-10-02 DIAGNOSIS — M069 Rheumatoid arthritis, unspecified: Secondary | ICD-10-CM | POA: Diagnosis not present

## 2022-10-02 DIAGNOSIS — Z853 Personal history of malignant neoplasm of breast: Secondary | ICD-10-CM | POA: Diagnosis not present

## 2022-10-02 DIAGNOSIS — Z7952 Long term (current) use of systemic steroids: Secondary | ICD-10-CM | POA: Diagnosis not present

## 2022-10-02 DIAGNOSIS — M419 Scoliosis, unspecified: Secondary | ICD-10-CM | POA: Diagnosis not present

## 2022-10-02 DIAGNOSIS — Z9181 History of falling: Secondary | ICD-10-CM | POA: Diagnosis not present

## 2022-10-02 DIAGNOSIS — N184 Chronic kidney disease, stage 4 (severe): Secondary | ICD-10-CM | POA: Diagnosis not present

## 2022-10-02 DIAGNOSIS — E538 Deficiency of other specified B group vitamins: Secondary | ICD-10-CM | POA: Diagnosis not present

## 2022-10-02 DIAGNOSIS — M159 Polyosteoarthritis, unspecified: Secondary | ICD-10-CM | POA: Diagnosis not present

## 2022-10-02 DIAGNOSIS — E871 Hypo-osmolality and hyponatremia: Secondary | ICD-10-CM | POA: Diagnosis not present

## 2022-10-02 DIAGNOSIS — Z6829 Body mass index (BMI) 29.0-29.9, adult: Secondary | ICD-10-CM | POA: Diagnosis not present

## 2022-10-02 DIAGNOSIS — M0689 Other specified rheumatoid arthritis, multiple sites: Secondary | ICD-10-CM | POA: Diagnosis not present

## 2022-10-02 DIAGNOSIS — G9341 Metabolic encephalopathy: Secondary | ICD-10-CM | POA: Diagnosis not present

## 2022-10-02 DIAGNOSIS — J45909 Unspecified asthma, uncomplicated: Secondary | ICD-10-CM | POA: Diagnosis not present

## 2022-10-02 DIAGNOSIS — Z7982 Long term (current) use of aspirin: Secondary | ICD-10-CM | POA: Diagnosis not present

## 2022-10-02 DIAGNOSIS — Z96653 Presence of artificial knee joint, bilateral: Secondary | ICD-10-CM | POA: Diagnosis not present

## 2022-10-02 DIAGNOSIS — M19011 Primary osteoarthritis, right shoulder: Secondary | ICD-10-CM | POA: Diagnosis not present

## 2022-10-02 DIAGNOSIS — I5032 Chronic diastolic (congestive) heart failure: Secondary | ICD-10-CM | POA: Diagnosis not present

## 2022-10-02 DIAGNOSIS — D631 Anemia in chronic kidney disease: Secondary | ICD-10-CM | POA: Diagnosis not present

## 2022-10-02 DIAGNOSIS — M19012 Primary osteoarthritis, left shoulder: Secondary | ICD-10-CM | POA: Diagnosis not present

## 2022-10-02 DIAGNOSIS — Z7984 Long term (current) use of oral hypoglycemic drugs: Secondary | ICD-10-CM | POA: Diagnosis not present

## 2022-10-02 DIAGNOSIS — M48061 Spinal stenosis, lumbar region without neurogenic claudication: Secondary | ICD-10-CM | POA: Diagnosis not present

## 2022-10-02 DIAGNOSIS — M19042 Primary osteoarthritis, left hand: Secondary | ICD-10-CM | POA: Diagnosis not present

## 2022-10-02 DIAGNOSIS — M109 Gout, unspecified: Secondary | ICD-10-CM | POA: Diagnosis not present

## 2022-10-02 DIAGNOSIS — M25551 Pain in right hip: Secondary | ICD-10-CM | POA: Diagnosis not present

## 2022-10-02 DIAGNOSIS — Z86718 Personal history of other venous thrombosis and embolism: Secondary | ICD-10-CM | POA: Diagnosis not present

## 2022-10-03 ENCOUNTER — Encounter: Payer: Self-pay | Admitting: Internal Medicine

## 2022-10-03 DIAGNOSIS — M79604 Pain in right leg: Secondary | ICD-10-CM | POA: Insufficient documentation

## 2022-10-03 NOTE — Assessment & Plan Note (Signed)
Currently stable, but pt adamant she is miserable in quality of life now due to pain without it, ok for refill prednisone 1 mg qd, pt understands further long term risks including bone fracture

## 2022-10-03 NOTE — Assessment & Plan Note (Signed)
Lab Results  Component Value Date   HGBA1C 5.7 (A) 09/29/2022   Stable, pt to continue current medical treatment farxiga 10 mg qd   

## 2022-10-03 NOTE — Assessment & Plan Note (Signed)
BP Readings from Last 3 Encounters:  09/29/22 122/64  09/29/22 122/64  07/16/22 128/64   Stable, pt to continue medical treatment norvasc 5 qd, losartan 100 mg qd

## 2022-10-03 NOTE — Assessment & Plan Note (Signed)
Etiology unclear, suspect msk but not worse to palpate or with walking; ok for right femur and hip films, also refer sport med

## 2022-10-06 DIAGNOSIS — Z96653 Presence of artificial knee joint, bilateral: Secondary | ICD-10-CM | POA: Diagnosis not present

## 2022-10-06 DIAGNOSIS — I130 Hypertensive heart and chronic kidney disease with heart failure and stage 1 through stage 4 chronic kidney disease, or unspecified chronic kidney disease: Secondary | ICD-10-CM | POA: Diagnosis not present

## 2022-10-06 DIAGNOSIS — D631 Anemia in chronic kidney disease: Secondary | ICD-10-CM | POA: Diagnosis not present

## 2022-10-06 DIAGNOSIS — N184 Chronic kidney disease, stage 4 (severe): Secondary | ICD-10-CM | POA: Diagnosis not present

## 2022-10-06 DIAGNOSIS — E871 Hypo-osmolality and hyponatremia: Secondary | ICD-10-CM | POA: Diagnosis not present

## 2022-10-06 DIAGNOSIS — M19012 Primary osteoarthritis, left shoulder: Secondary | ICD-10-CM | POA: Diagnosis not present

## 2022-10-06 DIAGNOSIS — M19011 Primary osteoarthritis, right shoulder: Secondary | ICD-10-CM | POA: Diagnosis not present

## 2022-10-06 DIAGNOSIS — M419 Scoliosis, unspecified: Secondary | ICD-10-CM | POA: Diagnosis not present

## 2022-10-06 DIAGNOSIS — M069 Rheumatoid arthritis, unspecified: Secondary | ICD-10-CM | POA: Diagnosis not present

## 2022-10-06 DIAGNOSIS — Z7952 Long term (current) use of systemic steroids: Secondary | ICD-10-CM | POA: Diagnosis not present

## 2022-10-06 DIAGNOSIS — Z7982 Long term (current) use of aspirin: Secondary | ICD-10-CM | POA: Diagnosis not present

## 2022-10-06 DIAGNOSIS — Z86718 Personal history of other venous thrombosis and embolism: Secondary | ICD-10-CM | POA: Diagnosis not present

## 2022-10-06 DIAGNOSIS — M48061 Spinal stenosis, lumbar region without neurogenic claudication: Secondary | ICD-10-CM | POA: Diagnosis not present

## 2022-10-06 DIAGNOSIS — J45909 Unspecified asthma, uncomplicated: Secondary | ICD-10-CM | POA: Diagnosis not present

## 2022-10-06 DIAGNOSIS — E876 Hypokalemia: Secondary | ICD-10-CM | POA: Diagnosis not present

## 2022-10-06 DIAGNOSIS — E538 Deficiency of other specified B group vitamins: Secondary | ICD-10-CM | POA: Diagnosis not present

## 2022-10-06 DIAGNOSIS — M25551 Pain in right hip: Secondary | ICD-10-CM | POA: Diagnosis not present

## 2022-10-06 DIAGNOSIS — I5032 Chronic diastolic (congestive) heart failure: Secondary | ICD-10-CM | POA: Diagnosis not present

## 2022-10-06 DIAGNOSIS — M19042 Primary osteoarthritis, left hand: Secondary | ICD-10-CM | POA: Diagnosis not present

## 2022-10-06 DIAGNOSIS — Z853 Personal history of malignant neoplasm of breast: Secondary | ICD-10-CM | POA: Diagnosis not present

## 2022-10-06 DIAGNOSIS — M19041 Primary osteoarthritis, right hand: Secondary | ICD-10-CM | POA: Diagnosis not present

## 2022-10-06 DIAGNOSIS — G9341 Metabolic encephalopathy: Secondary | ICD-10-CM | POA: Diagnosis not present

## 2022-10-06 DIAGNOSIS — Z7984 Long term (current) use of oral hypoglycemic drugs: Secondary | ICD-10-CM | POA: Diagnosis not present

## 2022-10-06 DIAGNOSIS — M109 Gout, unspecified: Secondary | ICD-10-CM | POA: Diagnosis not present

## 2022-10-08 DIAGNOSIS — M069 Rheumatoid arthritis, unspecified: Secondary | ICD-10-CM | POA: Diagnosis not present

## 2022-10-08 DIAGNOSIS — E876 Hypokalemia: Secondary | ICD-10-CM | POA: Diagnosis not present

## 2022-10-08 DIAGNOSIS — M25551 Pain in right hip: Secondary | ICD-10-CM | POA: Diagnosis not present

## 2022-10-08 DIAGNOSIS — I5032 Chronic diastolic (congestive) heart failure: Secondary | ICD-10-CM | POA: Diagnosis not present

## 2022-10-08 DIAGNOSIS — M109 Gout, unspecified: Secondary | ICD-10-CM | POA: Diagnosis not present

## 2022-10-08 DIAGNOSIS — I130 Hypertensive heart and chronic kidney disease with heart failure and stage 1 through stage 4 chronic kidney disease, or unspecified chronic kidney disease: Secondary | ICD-10-CM | POA: Diagnosis not present

## 2022-10-08 DIAGNOSIS — D631 Anemia in chronic kidney disease: Secondary | ICD-10-CM | POA: Diagnosis not present

## 2022-10-08 DIAGNOSIS — M19042 Primary osteoarthritis, left hand: Secondary | ICD-10-CM | POA: Diagnosis not present

## 2022-10-08 DIAGNOSIS — Z7952 Long term (current) use of systemic steroids: Secondary | ICD-10-CM | POA: Diagnosis not present

## 2022-10-08 DIAGNOSIS — Z7982 Long term (current) use of aspirin: Secondary | ICD-10-CM | POA: Diagnosis not present

## 2022-10-08 DIAGNOSIS — M419 Scoliosis, unspecified: Secondary | ICD-10-CM | POA: Diagnosis not present

## 2022-10-08 DIAGNOSIS — Z853 Personal history of malignant neoplasm of breast: Secondary | ICD-10-CM | POA: Diagnosis not present

## 2022-10-08 DIAGNOSIS — Z96653 Presence of artificial knee joint, bilateral: Secondary | ICD-10-CM | POA: Diagnosis not present

## 2022-10-08 DIAGNOSIS — Z86718 Personal history of other venous thrombosis and embolism: Secondary | ICD-10-CM | POA: Diagnosis not present

## 2022-10-08 DIAGNOSIS — N184 Chronic kidney disease, stage 4 (severe): Secondary | ICD-10-CM | POA: Diagnosis not present

## 2022-10-08 DIAGNOSIS — M19011 Primary osteoarthritis, right shoulder: Secondary | ICD-10-CM | POA: Diagnosis not present

## 2022-10-08 DIAGNOSIS — M48061 Spinal stenosis, lumbar region without neurogenic claudication: Secondary | ICD-10-CM | POA: Diagnosis not present

## 2022-10-08 DIAGNOSIS — J45909 Unspecified asthma, uncomplicated: Secondary | ICD-10-CM | POA: Diagnosis not present

## 2022-10-08 DIAGNOSIS — M19041 Primary osteoarthritis, right hand: Secondary | ICD-10-CM | POA: Diagnosis not present

## 2022-10-08 DIAGNOSIS — E538 Deficiency of other specified B group vitamins: Secondary | ICD-10-CM | POA: Diagnosis not present

## 2022-10-08 DIAGNOSIS — G9341 Metabolic encephalopathy: Secondary | ICD-10-CM | POA: Diagnosis not present

## 2022-10-08 DIAGNOSIS — M19012 Primary osteoarthritis, left shoulder: Secondary | ICD-10-CM | POA: Diagnosis not present

## 2022-10-08 DIAGNOSIS — Z7984 Long term (current) use of oral hypoglycemic drugs: Secondary | ICD-10-CM | POA: Diagnosis not present

## 2022-10-08 DIAGNOSIS — E871 Hypo-osmolality and hyponatremia: Secondary | ICD-10-CM | POA: Diagnosis not present

## 2022-10-12 DIAGNOSIS — E538 Deficiency of other specified B group vitamins: Secondary | ICD-10-CM | POA: Diagnosis not present

## 2022-10-12 DIAGNOSIS — Z7952 Long term (current) use of systemic steroids: Secondary | ICD-10-CM | POA: Diagnosis not present

## 2022-10-12 DIAGNOSIS — M19012 Primary osteoarthritis, left shoulder: Secondary | ICD-10-CM | POA: Diagnosis not present

## 2022-10-12 DIAGNOSIS — M19011 Primary osteoarthritis, right shoulder: Secondary | ICD-10-CM | POA: Diagnosis not present

## 2022-10-12 DIAGNOSIS — H10413 Chronic giant papillary conjunctivitis, bilateral: Secondary | ICD-10-CM | POA: Diagnosis not present

## 2022-10-12 DIAGNOSIS — M419 Scoliosis, unspecified: Secondary | ICD-10-CM | POA: Diagnosis not present

## 2022-10-12 DIAGNOSIS — M19041 Primary osteoarthritis, right hand: Secondary | ICD-10-CM | POA: Diagnosis not present

## 2022-10-12 DIAGNOSIS — E876 Hypokalemia: Secondary | ICD-10-CM | POA: Diagnosis not present

## 2022-10-12 DIAGNOSIS — H43813 Vitreous degeneration, bilateral: Secondary | ICD-10-CM | POA: Diagnosis not present

## 2022-10-12 DIAGNOSIS — M069 Rheumatoid arthritis, unspecified: Secondary | ICD-10-CM | POA: Diagnosis not present

## 2022-10-12 DIAGNOSIS — Z853 Personal history of malignant neoplasm of breast: Secondary | ICD-10-CM | POA: Diagnosis not present

## 2022-10-12 DIAGNOSIS — Z961 Presence of intraocular lens: Secondary | ICD-10-CM | POA: Diagnosis not present

## 2022-10-12 DIAGNOSIS — Z86718 Personal history of other venous thrombosis and embolism: Secondary | ICD-10-CM | POA: Diagnosis not present

## 2022-10-12 DIAGNOSIS — G9341 Metabolic encephalopathy: Secondary | ICD-10-CM | POA: Diagnosis not present

## 2022-10-12 DIAGNOSIS — M109 Gout, unspecified: Secondary | ICD-10-CM | POA: Diagnosis not present

## 2022-10-12 DIAGNOSIS — M48061 Spinal stenosis, lumbar region without neurogenic claudication: Secondary | ICD-10-CM | POA: Diagnosis not present

## 2022-10-12 DIAGNOSIS — Z7982 Long term (current) use of aspirin: Secondary | ICD-10-CM | POA: Diagnosis not present

## 2022-10-12 DIAGNOSIS — M25551 Pain in right hip: Secondary | ICD-10-CM | POA: Diagnosis not present

## 2022-10-12 DIAGNOSIS — I5032 Chronic diastolic (congestive) heart failure: Secondary | ICD-10-CM | POA: Diagnosis not present

## 2022-10-12 DIAGNOSIS — H04123 Dry eye syndrome of bilateral lacrimal glands: Secondary | ICD-10-CM | POA: Diagnosis not present

## 2022-10-12 DIAGNOSIS — H11823 Conjunctivochalasis, bilateral: Secondary | ICD-10-CM | POA: Diagnosis not present

## 2022-10-12 DIAGNOSIS — Z96653 Presence of artificial knee joint, bilateral: Secondary | ICD-10-CM | POA: Diagnosis not present

## 2022-10-12 DIAGNOSIS — E871 Hypo-osmolality and hyponatremia: Secondary | ICD-10-CM | POA: Diagnosis not present

## 2022-10-12 DIAGNOSIS — I130 Hypertensive heart and chronic kidney disease with heart failure and stage 1 through stage 4 chronic kidney disease, or unspecified chronic kidney disease: Secondary | ICD-10-CM | POA: Diagnosis not present

## 2022-10-12 DIAGNOSIS — J45909 Unspecified asthma, uncomplicated: Secondary | ICD-10-CM | POA: Diagnosis not present

## 2022-10-12 DIAGNOSIS — N184 Chronic kidney disease, stage 4 (severe): Secondary | ICD-10-CM | POA: Diagnosis not present

## 2022-10-12 DIAGNOSIS — Z7984 Long term (current) use of oral hypoglycemic drugs: Secondary | ICD-10-CM | POA: Diagnosis not present

## 2022-10-12 DIAGNOSIS — D631 Anemia in chronic kidney disease: Secondary | ICD-10-CM | POA: Diagnosis not present

## 2022-10-12 DIAGNOSIS — M19042 Primary osteoarthritis, left hand: Secondary | ICD-10-CM | POA: Diagnosis not present

## 2022-10-13 DIAGNOSIS — J45909 Unspecified asthma, uncomplicated: Secondary | ICD-10-CM | POA: Diagnosis not present

## 2022-10-13 DIAGNOSIS — E876 Hypokalemia: Secondary | ICD-10-CM | POA: Diagnosis not present

## 2022-10-13 DIAGNOSIS — Z853 Personal history of malignant neoplasm of breast: Secondary | ICD-10-CM | POA: Diagnosis not present

## 2022-10-13 DIAGNOSIS — M19012 Primary osteoarthritis, left shoulder: Secondary | ICD-10-CM | POA: Diagnosis not present

## 2022-10-13 DIAGNOSIS — M25551 Pain in right hip: Secondary | ICD-10-CM | POA: Diagnosis not present

## 2022-10-13 DIAGNOSIS — E538 Deficiency of other specified B group vitamins: Secondary | ICD-10-CM | POA: Diagnosis not present

## 2022-10-13 DIAGNOSIS — Z7952 Long term (current) use of systemic steroids: Secondary | ICD-10-CM | POA: Diagnosis not present

## 2022-10-13 DIAGNOSIS — I5032 Chronic diastolic (congestive) heart failure: Secondary | ICD-10-CM | POA: Diagnosis not present

## 2022-10-13 DIAGNOSIS — D631 Anemia in chronic kidney disease: Secondary | ICD-10-CM | POA: Diagnosis not present

## 2022-10-13 DIAGNOSIS — M19011 Primary osteoarthritis, right shoulder: Secondary | ICD-10-CM | POA: Diagnosis not present

## 2022-10-13 DIAGNOSIS — Z96653 Presence of artificial knee joint, bilateral: Secondary | ICD-10-CM | POA: Diagnosis not present

## 2022-10-13 DIAGNOSIS — Z7982 Long term (current) use of aspirin: Secondary | ICD-10-CM | POA: Diagnosis not present

## 2022-10-13 DIAGNOSIS — M109 Gout, unspecified: Secondary | ICD-10-CM | POA: Diagnosis not present

## 2022-10-13 DIAGNOSIS — G9341 Metabolic encephalopathy: Secondary | ICD-10-CM | POA: Diagnosis not present

## 2022-10-13 DIAGNOSIS — I130 Hypertensive heart and chronic kidney disease with heart failure and stage 1 through stage 4 chronic kidney disease, or unspecified chronic kidney disease: Secondary | ICD-10-CM | POA: Diagnosis not present

## 2022-10-13 DIAGNOSIS — E871 Hypo-osmolality and hyponatremia: Secondary | ICD-10-CM | POA: Diagnosis not present

## 2022-10-13 DIAGNOSIS — Z86718 Personal history of other venous thrombosis and embolism: Secondary | ICD-10-CM | POA: Diagnosis not present

## 2022-10-13 DIAGNOSIS — M48061 Spinal stenosis, lumbar region without neurogenic claudication: Secondary | ICD-10-CM | POA: Diagnosis not present

## 2022-10-13 DIAGNOSIS — Z7984 Long term (current) use of oral hypoglycemic drugs: Secondary | ICD-10-CM | POA: Diagnosis not present

## 2022-10-13 DIAGNOSIS — M19041 Primary osteoarthritis, right hand: Secondary | ICD-10-CM | POA: Diagnosis not present

## 2022-10-13 DIAGNOSIS — N184 Chronic kidney disease, stage 4 (severe): Secondary | ICD-10-CM | POA: Diagnosis not present

## 2022-10-13 DIAGNOSIS — M069 Rheumatoid arthritis, unspecified: Secondary | ICD-10-CM | POA: Diagnosis not present

## 2022-10-13 DIAGNOSIS — M419 Scoliosis, unspecified: Secondary | ICD-10-CM | POA: Diagnosis not present

## 2022-10-13 DIAGNOSIS — M19042 Primary osteoarthritis, left hand: Secondary | ICD-10-CM | POA: Diagnosis not present

## 2022-10-14 DIAGNOSIS — M109 Gout, unspecified: Secondary | ICD-10-CM | POA: Diagnosis not present

## 2022-10-14 DIAGNOSIS — E871 Hypo-osmolality and hyponatremia: Secondary | ICD-10-CM | POA: Diagnosis not present

## 2022-10-14 DIAGNOSIS — G9341 Metabolic encephalopathy: Secondary | ICD-10-CM | POA: Diagnosis not present

## 2022-10-14 DIAGNOSIS — D631 Anemia in chronic kidney disease: Secondary | ICD-10-CM | POA: Diagnosis not present

## 2022-10-14 DIAGNOSIS — Z7952 Long term (current) use of systemic steroids: Secondary | ICD-10-CM | POA: Diagnosis not present

## 2022-10-14 DIAGNOSIS — M419 Scoliosis, unspecified: Secondary | ICD-10-CM | POA: Diagnosis not present

## 2022-10-14 DIAGNOSIS — Z853 Personal history of malignant neoplasm of breast: Secondary | ICD-10-CM | POA: Diagnosis not present

## 2022-10-14 DIAGNOSIS — M19011 Primary osteoarthritis, right shoulder: Secondary | ICD-10-CM | POA: Diagnosis not present

## 2022-10-14 DIAGNOSIS — M19012 Primary osteoarthritis, left shoulder: Secondary | ICD-10-CM | POA: Diagnosis not present

## 2022-10-14 DIAGNOSIS — M19042 Primary osteoarthritis, left hand: Secondary | ICD-10-CM | POA: Diagnosis not present

## 2022-10-14 DIAGNOSIS — E876 Hypokalemia: Secondary | ICD-10-CM | POA: Diagnosis not present

## 2022-10-14 DIAGNOSIS — I5032 Chronic diastolic (congestive) heart failure: Secondary | ICD-10-CM | POA: Diagnosis not present

## 2022-10-14 DIAGNOSIS — M25551 Pain in right hip: Secondary | ICD-10-CM | POA: Diagnosis not present

## 2022-10-14 DIAGNOSIS — M48061 Spinal stenosis, lumbar region without neurogenic claudication: Secondary | ICD-10-CM | POA: Diagnosis not present

## 2022-10-14 DIAGNOSIS — Z86718 Personal history of other venous thrombosis and embolism: Secondary | ICD-10-CM | POA: Diagnosis not present

## 2022-10-14 DIAGNOSIS — Z96653 Presence of artificial knee joint, bilateral: Secondary | ICD-10-CM | POA: Diagnosis not present

## 2022-10-14 DIAGNOSIS — J45909 Unspecified asthma, uncomplicated: Secondary | ICD-10-CM | POA: Diagnosis not present

## 2022-10-14 DIAGNOSIS — N184 Chronic kidney disease, stage 4 (severe): Secondary | ICD-10-CM | POA: Diagnosis not present

## 2022-10-14 DIAGNOSIS — Z7984 Long term (current) use of oral hypoglycemic drugs: Secondary | ICD-10-CM | POA: Diagnosis not present

## 2022-10-14 DIAGNOSIS — I130 Hypertensive heart and chronic kidney disease with heart failure and stage 1 through stage 4 chronic kidney disease, or unspecified chronic kidney disease: Secondary | ICD-10-CM | POA: Diagnosis not present

## 2022-10-14 DIAGNOSIS — Z7982 Long term (current) use of aspirin: Secondary | ICD-10-CM | POA: Diagnosis not present

## 2022-10-14 DIAGNOSIS — M069 Rheumatoid arthritis, unspecified: Secondary | ICD-10-CM | POA: Diagnosis not present

## 2022-10-14 DIAGNOSIS — M19041 Primary osteoarthritis, right hand: Secondary | ICD-10-CM | POA: Diagnosis not present

## 2022-10-14 DIAGNOSIS — E538 Deficiency of other specified B group vitamins: Secondary | ICD-10-CM | POA: Diagnosis not present

## 2022-10-15 DIAGNOSIS — Z7984 Long term (current) use of oral hypoglycemic drugs: Secondary | ICD-10-CM | POA: Diagnosis not present

## 2022-10-15 DIAGNOSIS — Z86718 Personal history of other venous thrombosis and embolism: Secondary | ICD-10-CM | POA: Diagnosis not present

## 2022-10-15 DIAGNOSIS — M069 Rheumatoid arthritis, unspecified: Secondary | ICD-10-CM | POA: Diagnosis not present

## 2022-10-15 DIAGNOSIS — M19041 Primary osteoarthritis, right hand: Secondary | ICD-10-CM | POA: Diagnosis not present

## 2022-10-15 DIAGNOSIS — J45909 Unspecified asthma, uncomplicated: Secondary | ICD-10-CM | POA: Diagnosis not present

## 2022-10-15 DIAGNOSIS — E538 Deficiency of other specified B group vitamins: Secondary | ICD-10-CM | POA: Diagnosis not present

## 2022-10-15 DIAGNOSIS — M19042 Primary osteoarthritis, left hand: Secondary | ICD-10-CM | POA: Diagnosis not present

## 2022-10-15 DIAGNOSIS — N184 Chronic kidney disease, stage 4 (severe): Secondary | ICD-10-CM | POA: Diagnosis not present

## 2022-10-15 DIAGNOSIS — M19011 Primary osteoarthritis, right shoulder: Secondary | ICD-10-CM | POA: Diagnosis not present

## 2022-10-15 DIAGNOSIS — Z96653 Presence of artificial knee joint, bilateral: Secondary | ICD-10-CM | POA: Diagnosis not present

## 2022-10-15 DIAGNOSIS — M48061 Spinal stenosis, lumbar region without neurogenic claudication: Secondary | ICD-10-CM | POA: Diagnosis not present

## 2022-10-15 DIAGNOSIS — Z853 Personal history of malignant neoplasm of breast: Secondary | ICD-10-CM | POA: Diagnosis not present

## 2022-10-15 DIAGNOSIS — M25551 Pain in right hip: Secondary | ICD-10-CM | POA: Diagnosis not present

## 2022-10-15 DIAGNOSIS — M419 Scoliosis, unspecified: Secondary | ICD-10-CM | POA: Diagnosis not present

## 2022-10-15 DIAGNOSIS — Z7952 Long term (current) use of systemic steroids: Secondary | ICD-10-CM | POA: Diagnosis not present

## 2022-10-15 DIAGNOSIS — D631 Anemia in chronic kidney disease: Secondary | ICD-10-CM | POA: Diagnosis not present

## 2022-10-15 DIAGNOSIS — Z7982 Long term (current) use of aspirin: Secondary | ICD-10-CM | POA: Diagnosis not present

## 2022-10-15 DIAGNOSIS — M19012 Primary osteoarthritis, left shoulder: Secondary | ICD-10-CM | POA: Diagnosis not present

## 2022-10-15 DIAGNOSIS — G9341 Metabolic encephalopathy: Secondary | ICD-10-CM | POA: Diagnosis not present

## 2022-10-15 DIAGNOSIS — M109 Gout, unspecified: Secondary | ICD-10-CM | POA: Diagnosis not present

## 2022-10-15 DIAGNOSIS — E876 Hypokalemia: Secondary | ICD-10-CM | POA: Diagnosis not present

## 2022-10-15 DIAGNOSIS — I5032 Chronic diastolic (congestive) heart failure: Secondary | ICD-10-CM | POA: Diagnosis not present

## 2022-10-15 DIAGNOSIS — I130 Hypertensive heart and chronic kidney disease with heart failure and stage 1 through stage 4 chronic kidney disease, or unspecified chronic kidney disease: Secondary | ICD-10-CM | POA: Diagnosis not present

## 2022-10-15 DIAGNOSIS — E871 Hypo-osmolality and hyponatremia: Secondary | ICD-10-CM | POA: Diagnosis not present

## 2022-10-20 NOTE — Progress Notes (Unsigned)
   I, Peterson Lombard, LAT, ATC acting as a scribe for Lynne Leader, MD.  [name] is a 86 y.o. female who presents to [facility] today for f/u R hip pain. She has chronic polyarthralgia pain that she previously was being treated for by her rheumatologist with chronic steroids.  Pt was last seen by Dr. Georgina Snell on 09/29/2022 and declined a steroid injection and was referred to home health PT.  Today, patient reports  Dx imaging: 09/29/2022 bilat hip XR  Pertinent review of systems: ***  Relevant historical information: ***   Exam:  There were no vitals taken for this visit. General: Well Developed, well nourished, and in no acute distress.   MSK: ***    Lab and Radiology Results No results found for this or any previous visit (from the past 72 hour(s)). No results found.     Assessment and Plan: 86 y.o. female with ***   PDMP not reviewed this encounter. No orders of the defined types were placed in this encounter.  No orders of the defined types were placed in this encounter.    Discussed warning signs or symptoms. Please see discharge instructions. Patient expresses understanding.   ***

## 2022-10-21 ENCOUNTER — Ambulatory Visit: Payer: Self-pay

## 2022-10-21 ENCOUNTER — Ambulatory Visit: Payer: Medicare HMO | Admitting: Family Medicine

## 2022-10-21 VITALS — BP 144/82 | HR 60 | Ht 61.0 in | Wt 158.0 lb

## 2022-10-21 DIAGNOSIS — I130 Hypertensive heart and chronic kidney disease with heart failure and stage 1 through stage 4 chronic kidney disease, or unspecified chronic kidney disease: Secondary | ICD-10-CM | POA: Diagnosis not present

## 2022-10-21 DIAGNOSIS — M25551 Pain in right hip: Secondary | ICD-10-CM | POA: Diagnosis not present

## 2022-10-21 DIAGNOSIS — M19041 Primary osteoarthritis, right hand: Secondary | ICD-10-CM | POA: Diagnosis not present

## 2022-10-21 DIAGNOSIS — Z96653 Presence of artificial knee joint, bilateral: Secondary | ICD-10-CM | POA: Diagnosis not present

## 2022-10-21 DIAGNOSIS — M48061 Spinal stenosis, lumbar region without neurogenic claudication: Secondary | ICD-10-CM | POA: Diagnosis not present

## 2022-10-21 DIAGNOSIS — M19042 Primary osteoarthritis, left hand: Secondary | ICD-10-CM | POA: Diagnosis not present

## 2022-10-21 DIAGNOSIS — Z853 Personal history of malignant neoplasm of breast: Secondary | ICD-10-CM | POA: Diagnosis not present

## 2022-10-21 DIAGNOSIS — E876 Hypokalemia: Secondary | ICD-10-CM | POA: Diagnosis not present

## 2022-10-21 DIAGNOSIS — E871 Hypo-osmolality and hyponatremia: Secondary | ICD-10-CM | POA: Diagnosis not present

## 2022-10-21 DIAGNOSIS — M19011 Primary osteoarthritis, right shoulder: Secondary | ICD-10-CM | POA: Diagnosis not present

## 2022-10-21 DIAGNOSIS — Z7984 Long term (current) use of oral hypoglycemic drugs: Secondary | ICD-10-CM | POA: Diagnosis not present

## 2022-10-21 DIAGNOSIS — M109 Gout, unspecified: Secondary | ICD-10-CM | POA: Diagnosis not present

## 2022-10-21 DIAGNOSIS — G9341 Metabolic encephalopathy: Secondary | ICD-10-CM | POA: Diagnosis not present

## 2022-10-21 DIAGNOSIS — I5032 Chronic diastolic (congestive) heart failure: Secondary | ICD-10-CM | POA: Diagnosis not present

## 2022-10-21 DIAGNOSIS — M19012 Primary osteoarthritis, left shoulder: Secondary | ICD-10-CM | POA: Diagnosis not present

## 2022-10-21 DIAGNOSIS — Z7952 Long term (current) use of systemic steroids: Secondary | ICD-10-CM | POA: Diagnosis not present

## 2022-10-21 DIAGNOSIS — D631 Anemia in chronic kidney disease: Secondary | ICD-10-CM | POA: Diagnosis not present

## 2022-10-21 DIAGNOSIS — M419 Scoliosis, unspecified: Secondary | ICD-10-CM | POA: Diagnosis not present

## 2022-10-21 DIAGNOSIS — M069 Rheumatoid arthritis, unspecified: Secondary | ICD-10-CM | POA: Diagnosis not present

## 2022-10-21 DIAGNOSIS — E538 Deficiency of other specified B group vitamins: Secondary | ICD-10-CM | POA: Diagnosis not present

## 2022-10-21 DIAGNOSIS — N184 Chronic kidney disease, stage 4 (severe): Secondary | ICD-10-CM | POA: Diagnosis not present

## 2022-10-21 DIAGNOSIS — J45909 Unspecified asthma, uncomplicated: Secondary | ICD-10-CM | POA: Diagnosis not present

## 2022-10-21 DIAGNOSIS — Z7982 Long term (current) use of aspirin: Secondary | ICD-10-CM | POA: Diagnosis not present

## 2022-10-21 DIAGNOSIS — Z86718 Personal history of other venous thrombosis and embolism: Secondary | ICD-10-CM | POA: Diagnosis not present

## 2022-10-21 NOTE — Patient Instructions (Addendum)
Thank you for coming in today.   You received an injection today. Seek immediate medical attention if the joint becomes red, extremely painful, or is oozing fluid.   Continue home health physical therapy  Recheck as needed.

## 2022-10-23 DIAGNOSIS — M25551 Pain in right hip: Secondary | ICD-10-CM | POA: Diagnosis not present

## 2022-10-23 DIAGNOSIS — D631 Anemia in chronic kidney disease: Secondary | ICD-10-CM | POA: Diagnosis not present

## 2022-10-23 DIAGNOSIS — I130 Hypertensive heart and chronic kidney disease with heart failure and stage 1 through stage 4 chronic kidney disease, or unspecified chronic kidney disease: Secondary | ICD-10-CM | POA: Diagnosis not present

## 2022-10-23 DIAGNOSIS — M19011 Primary osteoarthritis, right shoulder: Secondary | ICD-10-CM | POA: Diagnosis not present

## 2022-10-23 DIAGNOSIS — I5032 Chronic diastolic (congestive) heart failure: Secondary | ICD-10-CM | POA: Diagnosis not present

## 2022-10-23 DIAGNOSIS — Z86718 Personal history of other venous thrombosis and embolism: Secondary | ICD-10-CM | POA: Diagnosis not present

## 2022-10-23 DIAGNOSIS — Z7984 Long term (current) use of oral hypoglycemic drugs: Secondary | ICD-10-CM | POA: Diagnosis not present

## 2022-10-23 DIAGNOSIS — G9341 Metabolic encephalopathy: Secondary | ICD-10-CM | POA: Diagnosis not present

## 2022-10-23 DIAGNOSIS — Z96653 Presence of artificial knee joint, bilateral: Secondary | ICD-10-CM | POA: Diagnosis not present

## 2022-10-23 DIAGNOSIS — J45909 Unspecified asthma, uncomplicated: Secondary | ICD-10-CM | POA: Diagnosis not present

## 2022-10-23 DIAGNOSIS — M109 Gout, unspecified: Secondary | ICD-10-CM | POA: Diagnosis not present

## 2022-10-23 DIAGNOSIS — M19012 Primary osteoarthritis, left shoulder: Secondary | ICD-10-CM | POA: Diagnosis not present

## 2022-10-23 DIAGNOSIS — M419 Scoliosis, unspecified: Secondary | ICD-10-CM | POA: Diagnosis not present

## 2022-10-23 DIAGNOSIS — E871 Hypo-osmolality and hyponatremia: Secondary | ICD-10-CM | POA: Diagnosis not present

## 2022-10-23 DIAGNOSIS — E538 Deficiency of other specified B group vitamins: Secondary | ICD-10-CM | POA: Diagnosis not present

## 2022-10-23 DIAGNOSIS — E876 Hypokalemia: Secondary | ICD-10-CM | POA: Diagnosis not present

## 2022-10-23 DIAGNOSIS — M48061 Spinal stenosis, lumbar region without neurogenic claudication: Secondary | ICD-10-CM | POA: Diagnosis not present

## 2022-10-23 DIAGNOSIS — Z7952 Long term (current) use of systemic steroids: Secondary | ICD-10-CM | POA: Diagnosis not present

## 2022-10-23 DIAGNOSIS — M19041 Primary osteoarthritis, right hand: Secondary | ICD-10-CM | POA: Diagnosis not present

## 2022-10-23 DIAGNOSIS — N184 Chronic kidney disease, stage 4 (severe): Secondary | ICD-10-CM | POA: Diagnosis not present

## 2022-10-23 DIAGNOSIS — M19042 Primary osteoarthritis, left hand: Secondary | ICD-10-CM | POA: Diagnosis not present

## 2022-10-23 DIAGNOSIS — Z7982 Long term (current) use of aspirin: Secondary | ICD-10-CM | POA: Diagnosis not present

## 2022-10-23 DIAGNOSIS — M069 Rheumatoid arthritis, unspecified: Secondary | ICD-10-CM | POA: Diagnosis not present

## 2022-10-23 DIAGNOSIS — Z853 Personal history of malignant neoplasm of breast: Secondary | ICD-10-CM | POA: Diagnosis not present

## 2022-10-27 DIAGNOSIS — M069 Rheumatoid arthritis, unspecified: Secondary | ICD-10-CM | POA: Diagnosis not present

## 2022-10-27 DIAGNOSIS — Z7984 Long term (current) use of oral hypoglycemic drugs: Secondary | ICD-10-CM | POA: Diagnosis not present

## 2022-10-27 DIAGNOSIS — M419 Scoliosis, unspecified: Secondary | ICD-10-CM | POA: Diagnosis not present

## 2022-10-27 DIAGNOSIS — M19012 Primary osteoarthritis, left shoulder: Secondary | ICD-10-CM | POA: Diagnosis not present

## 2022-10-27 DIAGNOSIS — M19041 Primary osteoarthritis, right hand: Secondary | ICD-10-CM | POA: Diagnosis not present

## 2022-10-27 DIAGNOSIS — M19011 Primary osteoarthritis, right shoulder: Secondary | ICD-10-CM | POA: Diagnosis not present

## 2022-10-27 DIAGNOSIS — Z86718 Personal history of other venous thrombosis and embolism: Secondary | ICD-10-CM | POA: Diagnosis not present

## 2022-10-27 DIAGNOSIS — G9341 Metabolic encephalopathy: Secondary | ICD-10-CM | POA: Diagnosis not present

## 2022-10-27 DIAGNOSIS — M19042 Primary osteoarthritis, left hand: Secondary | ICD-10-CM | POA: Diagnosis not present

## 2022-10-27 DIAGNOSIS — Z7982 Long term (current) use of aspirin: Secondary | ICD-10-CM | POA: Diagnosis not present

## 2022-10-27 DIAGNOSIS — I130 Hypertensive heart and chronic kidney disease with heart failure and stage 1 through stage 4 chronic kidney disease, or unspecified chronic kidney disease: Secondary | ICD-10-CM | POA: Diagnosis not present

## 2022-10-27 DIAGNOSIS — E871 Hypo-osmolality and hyponatremia: Secondary | ICD-10-CM | POA: Diagnosis not present

## 2022-10-27 DIAGNOSIS — N184 Chronic kidney disease, stage 4 (severe): Secondary | ICD-10-CM | POA: Diagnosis not present

## 2022-10-27 DIAGNOSIS — M109 Gout, unspecified: Secondary | ICD-10-CM | POA: Diagnosis not present

## 2022-10-27 DIAGNOSIS — Z96653 Presence of artificial knee joint, bilateral: Secondary | ICD-10-CM | POA: Diagnosis not present

## 2022-10-27 DIAGNOSIS — I5032 Chronic diastolic (congestive) heart failure: Secondary | ICD-10-CM | POA: Diagnosis not present

## 2022-10-27 DIAGNOSIS — E538 Deficiency of other specified B group vitamins: Secondary | ICD-10-CM | POA: Diagnosis not present

## 2022-10-27 DIAGNOSIS — J45909 Unspecified asthma, uncomplicated: Secondary | ICD-10-CM | POA: Diagnosis not present

## 2022-10-27 DIAGNOSIS — M48061 Spinal stenosis, lumbar region without neurogenic claudication: Secondary | ICD-10-CM | POA: Diagnosis not present

## 2022-10-27 DIAGNOSIS — E876 Hypokalemia: Secondary | ICD-10-CM | POA: Diagnosis not present

## 2022-10-27 DIAGNOSIS — Z7952 Long term (current) use of systemic steroids: Secondary | ICD-10-CM | POA: Diagnosis not present

## 2022-10-27 DIAGNOSIS — M25551 Pain in right hip: Secondary | ICD-10-CM | POA: Diagnosis not present

## 2022-10-27 DIAGNOSIS — Z853 Personal history of malignant neoplasm of breast: Secondary | ICD-10-CM | POA: Diagnosis not present

## 2022-10-27 DIAGNOSIS — D631 Anemia in chronic kidney disease: Secondary | ICD-10-CM | POA: Diagnosis not present

## 2022-10-28 ENCOUNTER — Telehealth: Payer: Self-pay | Admitting: Internal Medicine

## 2022-10-28 DIAGNOSIS — Z86718 Personal history of other venous thrombosis and embolism: Secondary | ICD-10-CM | POA: Diagnosis not present

## 2022-10-28 DIAGNOSIS — M069 Rheumatoid arthritis, unspecified: Secondary | ICD-10-CM | POA: Diagnosis not present

## 2022-10-28 DIAGNOSIS — Z853 Personal history of malignant neoplasm of breast: Secondary | ICD-10-CM | POA: Diagnosis not present

## 2022-10-28 DIAGNOSIS — G9341 Metabolic encephalopathy: Secondary | ICD-10-CM | POA: Diagnosis not present

## 2022-10-28 DIAGNOSIS — I130 Hypertensive heart and chronic kidney disease with heart failure and stage 1 through stage 4 chronic kidney disease, or unspecified chronic kidney disease: Secondary | ICD-10-CM | POA: Diagnosis not present

## 2022-10-28 DIAGNOSIS — M19012 Primary osteoarthritis, left shoulder: Secondary | ICD-10-CM | POA: Diagnosis not present

## 2022-10-28 DIAGNOSIS — E871 Hypo-osmolality and hyponatremia: Secondary | ICD-10-CM | POA: Diagnosis not present

## 2022-10-28 DIAGNOSIS — Z7984 Long term (current) use of oral hypoglycemic drugs: Secondary | ICD-10-CM | POA: Diagnosis not present

## 2022-10-28 DIAGNOSIS — E876 Hypokalemia: Secondary | ICD-10-CM | POA: Diagnosis not present

## 2022-10-28 DIAGNOSIS — M19011 Primary osteoarthritis, right shoulder: Secondary | ICD-10-CM | POA: Diagnosis not present

## 2022-10-28 DIAGNOSIS — M19041 Primary osteoarthritis, right hand: Secondary | ICD-10-CM | POA: Diagnosis not present

## 2022-10-28 DIAGNOSIS — M109 Gout, unspecified: Secondary | ICD-10-CM | POA: Diagnosis not present

## 2022-10-28 DIAGNOSIS — M48061 Spinal stenosis, lumbar region without neurogenic claudication: Secondary | ICD-10-CM | POA: Diagnosis not present

## 2022-10-28 DIAGNOSIS — M419 Scoliosis, unspecified: Secondary | ICD-10-CM | POA: Diagnosis not present

## 2022-10-28 DIAGNOSIS — M19042 Primary osteoarthritis, left hand: Secondary | ICD-10-CM | POA: Diagnosis not present

## 2022-10-28 DIAGNOSIS — Z7982 Long term (current) use of aspirin: Secondary | ICD-10-CM | POA: Diagnosis not present

## 2022-10-28 DIAGNOSIS — J45909 Unspecified asthma, uncomplicated: Secondary | ICD-10-CM | POA: Diagnosis not present

## 2022-10-28 DIAGNOSIS — Z96653 Presence of artificial knee joint, bilateral: Secondary | ICD-10-CM | POA: Diagnosis not present

## 2022-10-28 DIAGNOSIS — I5032 Chronic diastolic (congestive) heart failure: Secondary | ICD-10-CM | POA: Diagnosis not present

## 2022-10-28 DIAGNOSIS — N184 Chronic kidney disease, stage 4 (severe): Secondary | ICD-10-CM | POA: Diagnosis not present

## 2022-10-28 DIAGNOSIS — M25551 Pain in right hip: Secondary | ICD-10-CM | POA: Diagnosis not present

## 2022-10-28 DIAGNOSIS — Z7952 Long term (current) use of systemic steroids: Secondary | ICD-10-CM | POA: Diagnosis not present

## 2022-10-28 DIAGNOSIS — E538 Deficiency of other specified B group vitamins: Secondary | ICD-10-CM | POA: Diagnosis not present

## 2022-10-28 DIAGNOSIS — D631 Anemia in chronic kidney disease: Secondary | ICD-10-CM | POA: Diagnosis not present

## 2022-10-28 MED ORDER — FLUTICASONE-SALMETEROL 250-50 MCG/ACT IN AEPB: 1.0000 | INHALATION_SPRAY | Freq: Two times a day (BID) | RESPIRATORY_TRACT | 3 refills | Status: DC

## 2022-10-28 NOTE — Telephone Encounter (Signed)
Patient called and stated that her Pharmacy on file does not have her medication ADVAIR DISKUS 250-50 MCG/ACT AEPB . She states that the medication is on back order. The pharmacy told her to give her PCP a call (Dr. Jenny Reichmann) to see if he would recommend her to take something else that he can call in for her or if he had any other suggestions. Patient's best callback number is [phone].

## 2022-10-28 NOTE — Telephone Encounter (Signed)
Ok for try change to wixhela (the generic for advair)  - done erx

## 2022-10-28 NOTE — Telephone Encounter (Signed)
Advair on back order, do you recommend something else. Please advise

## 2022-10-29 NOTE — Telephone Encounter (Signed)
Left message regarding medication change to pharmacy

## 2022-11-02 DIAGNOSIS — M109 Gout, unspecified: Secondary | ICD-10-CM | POA: Diagnosis not present

## 2022-11-02 DIAGNOSIS — R5383 Other fatigue: Secondary | ICD-10-CM | POA: Diagnosis not present

## 2022-11-02 DIAGNOSIS — M5430 Sciatica, unspecified side: Secondary | ICD-10-CM | POA: Diagnosis not present

## 2022-11-02 DIAGNOSIS — Z6830 Body mass index (BMI) 30.0-30.9, adult: Secondary | ICD-10-CM | POA: Diagnosis not present

## 2022-11-02 DIAGNOSIS — R7303 Prediabetes: Secondary | ICD-10-CM | POA: Diagnosis not present

## 2022-11-02 DIAGNOSIS — Z86718 Personal history of other venous thrombosis and embolism: Secondary | ICD-10-CM | POA: Diagnosis not present

## 2022-11-02 DIAGNOSIS — Z7952 Long term (current) use of systemic steroids: Secondary | ICD-10-CM | POA: Diagnosis not present

## 2022-11-02 DIAGNOSIS — M810 Age-related osteoporosis without current pathological fracture: Secondary | ICD-10-CM | POA: Diagnosis not present

## 2022-11-02 DIAGNOSIS — I10 Essential (primary) hypertension: Secondary | ICD-10-CM | POA: Diagnosis not present

## 2022-11-02 DIAGNOSIS — M19042 Primary osteoarthritis, left hand: Secondary | ICD-10-CM | POA: Diagnosis not present

## 2022-11-02 DIAGNOSIS — M48061 Spinal stenosis, lumbar region without neurogenic claudication: Secondary | ICD-10-CM | POA: Diagnosis not present

## 2022-11-02 DIAGNOSIS — M25512 Pain in left shoulder: Secondary | ICD-10-CM | POA: Diagnosis not present

## 2022-11-02 DIAGNOSIS — M1990 Unspecified osteoarthritis, unspecified site: Secondary | ICD-10-CM | POA: Diagnosis not present

## 2022-11-02 DIAGNOSIS — E7800 Pure hypercholesterolemia, unspecified: Secondary | ICD-10-CM | POA: Diagnosis not present

## 2022-11-02 DIAGNOSIS — I130 Hypertensive heart and chronic kidney disease with heart failure and stage 1 through stage 4 chronic kidney disease, or unspecified chronic kidney disease: Secondary | ICD-10-CM | POA: Diagnosis not present

## 2022-11-02 DIAGNOSIS — R32 Unspecified urinary incontinence: Secondary | ICD-10-CM | POA: Diagnosis not present

## 2022-11-02 DIAGNOSIS — E669 Obesity, unspecified: Secondary | ICD-10-CM | POA: Diagnosis not present

## 2022-11-02 DIAGNOSIS — M62838 Other muscle spasm: Secondary | ICD-10-CM | POA: Diagnosis not present

## 2022-11-02 DIAGNOSIS — K5900 Constipation, unspecified: Secondary | ICD-10-CM | POA: Diagnosis not present

## 2022-11-02 DIAGNOSIS — M25551 Pain in right hip: Secondary | ICD-10-CM | POA: Diagnosis not present

## 2022-11-02 DIAGNOSIS — E871 Hypo-osmolality and hyponatremia: Secondary | ICD-10-CM | POA: Diagnosis not present

## 2022-11-02 DIAGNOSIS — J45909 Unspecified asthma, uncomplicated: Secondary | ICD-10-CM | POA: Diagnosis not present

## 2022-11-02 DIAGNOSIS — N184 Chronic kidney disease, stage 4 (severe): Secondary | ICD-10-CM | POA: Diagnosis not present

## 2022-11-02 DIAGNOSIS — Z008 Encounter for other general examination: Secondary | ICD-10-CM | POA: Diagnosis not present

## 2022-11-02 DIAGNOSIS — M19012 Primary osteoarthritis, left shoulder: Secondary | ICD-10-CM | POA: Diagnosis not present

## 2022-11-02 DIAGNOSIS — M47816 Spondylosis without myelopathy or radiculopathy, lumbar region: Secondary | ICD-10-CM | POA: Diagnosis not present

## 2022-11-02 DIAGNOSIS — I5032 Chronic diastolic (congestive) heart failure: Secondary | ICD-10-CM | POA: Diagnosis not present

## 2022-11-02 DIAGNOSIS — Z853 Personal history of malignant neoplasm of breast: Secondary | ICD-10-CM | POA: Diagnosis not present

## 2022-11-02 DIAGNOSIS — D631 Anemia in chronic kidney disease: Secondary | ICD-10-CM | POA: Diagnosis not present

## 2022-11-02 DIAGNOSIS — Z7984 Long term (current) use of oral hypoglycemic drugs: Secondary | ICD-10-CM | POA: Diagnosis not present

## 2022-11-02 DIAGNOSIS — M549 Dorsalgia, unspecified: Secondary | ICD-10-CM | POA: Diagnosis not present

## 2022-11-02 DIAGNOSIS — J302 Other seasonal allergic rhinitis: Secondary | ICD-10-CM | POA: Diagnosis not present

## 2022-11-02 DIAGNOSIS — E876 Hypokalemia: Secondary | ICD-10-CM | POA: Diagnosis not present

## 2022-11-02 DIAGNOSIS — M419 Scoliosis, unspecified: Secondary | ICD-10-CM | POA: Diagnosis not present

## 2022-11-02 DIAGNOSIS — Z0130 Encounter for examination of blood pressure without abnormal findings: Secondary | ICD-10-CM | POA: Diagnosis not present

## 2022-11-02 DIAGNOSIS — K219 Gastro-esophageal reflux disease without esophagitis: Secondary | ICD-10-CM | POA: Diagnosis not present

## 2022-11-02 DIAGNOSIS — M19011 Primary osteoarthritis, right shoulder: Secondary | ICD-10-CM | POA: Diagnosis not present

## 2022-11-02 DIAGNOSIS — M069 Rheumatoid arthritis, unspecified: Secondary | ICD-10-CM | POA: Diagnosis not present

## 2022-11-02 DIAGNOSIS — Z96653 Presence of artificial knee joint, bilateral: Secondary | ICD-10-CM | POA: Diagnosis not present

## 2022-11-02 DIAGNOSIS — M19041 Primary osteoarthritis, right hand: Secondary | ICD-10-CM | POA: Diagnosis not present

## 2022-11-02 DIAGNOSIS — Z7982 Long term (current) use of aspirin: Secondary | ICD-10-CM | POA: Diagnosis not present

## 2022-11-02 DIAGNOSIS — G9341 Metabolic encephalopathy: Secondary | ICD-10-CM | POA: Diagnosis not present

## 2022-11-02 DIAGNOSIS — M4800 Spinal stenosis, site unspecified: Secondary | ICD-10-CM | POA: Diagnosis not present

## 2022-11-02 DIAGNOSIS — R2681 Unsteadiness on feet: Secondary | ICD-10-CM | POA: Diagnosis not present

## 2022-11-02 DIAGNOSIS — E538 Deficiency of other specified B group vitamins: Secondary | ICD-10-CM | POA: Diagnosis not present

## 2022-11-02 DIAGNOSIS — M25511 Pain in right shoulder: Secondary | ICD-10-CM | POA: Diagnosis not present

## 2022-11-03 DIAGNOSIS — M48061 Spinal stenosis, lumbar region without neurogenic claudication: Secondary | ICD-10-CM | POA: Diagnosis not present

## 2022-11-03 DIAGNOSIS — M19041 Primary osteoarthritis, right hand: Secondary | ICD-10-CM | POA: Diagnosis not present

## 2022-11-03 DIAGNOSIS — M19042 Primary osteoarthritis, left hand: Secondary | ICD-10-CM | POA: Diagnosis not present

## 2022-11-03 DIAGNOSIS — M19012 Primary osteoarthritis, left shoulder: Secondary | ICD-10-CM | POA: Diagnosis not present

## 2022-11-03 DIAGNOSIS — E871 Hypo-osmolality and hyponatremia: Secondary | ICD-10-CM | POA: Diagnosis not present

## 2022-11-03 DIAGNOSIS — D631 Anemia in chronic kidney disease: Secondary | ICD-10-CM | POA: Diagnosis not present

## 2022-11-03 DIAGNOSIS — Z96653 Presence of artificial knee joint, bilateral: Secondary | ICD-10-CM | POA: Diagnosis not present

## 2022-11-03 DIAGNOSIS — J45909 Unspecified asthma, uncomplicated: Secondary | ICD-10-CM | POA: Diagnosis not present

## 2022-11-03 DIAGNOSIS — M19011 Primary osteoarthritis, right shoulder: Secondary | ICD-10-CM | POA: Diagnosis not present

## 2022-11-03 DIAGNOSIS — Z86718 Personal history of other venous thrombosis and embolism: Secondary | ICD-10-CM | POA: Diagnosis not present

## 2022-11-03 DIAGNOSIS — E538 Deficiency of other specified B group vitamins: Secondary | ICD-10-CM | POA: Diagnosis not present

## 2022-11-03 DIAGNOSIS — I5032 Chronic diastolic (congestive) heart failure: Secondary | ICD-10-CM | POA: Diagnosis not present

## 2022-11-03 DIAGNOSIS — Z7952 Long term (current) use of systemic steroids: Secondary | ICD-10-CM | POA: Diagnosis not present

## 2022-11-03 DIAGNOSIS — N184 Chronic kidney disease, stage 4 (severe): Secondary | ICD-10-CM | POA: Diagnosis not present

## 2022-11-03 DIAGNOSIS — Z853 Personal history of malignant neoplasm of breast: Secondary | ICD-10-CM | POA: Diagnosis not present

## 2022-11-03 DIAGNOSIS — M109 Gout, unspecified: Secondary | ICD-10-CM | POA: Diagnosis not present

## 2022-11-03 DIAGNOSIS — Z7984 Long term (current) use of oral hypoglycemic drugs: Secondary | ICD-10-CM | POA: Diagnosis not present

## 2022-11-03 DIAGNOSIS — M25551 Pain in right hip: Secondary | ICD-10-CM | POA: Diagnosis not present

## 2022-11-03 DIAGNOSIS — M419 Scoliosis, unspecified: Secondary | ICD-10-CM | POA: Diagnosis not present

## 2022-11-03 DIAGNOSIS — I130 Hypertensive heart and chronic kidney disease with heart failure and stage 1 through stage 4 chronic kidney disease, or unspecified chronic kidney disease: Secondary | ICD-10-CM | POA: Diagnosis not present

## 2022-11-03 DIAGNOSIS — E876 Hypokalemia: Secondary | ICD-10-CM | POA: Diagnosis not present

## 2022-11-03 DIAGNOSIS — M069 Rheumatoid arthritis, unspecified: Secondary | ICD-10-CM | POA: Diagnosis not present

## 2022-11-03 DIAGNOSIS — Z7982 Long term (current) use of aspirin: Secondary | ICD-10-CM | POA: Diagnosis not present

## 2022-11-03 DIAGNOSIS — G9341 Metabolic encephalopathy: Secondary | ICD-10-CM | POA: Diagnosis not present

## 2022-11-04 ENCOUNTER — Other Ambulatory Visit: Payer: Self-pay | Admitting: Nephrology

## 2022-11-04 DIAGNOSIS — N289 Disorder of kidney and ureter, unspecified: Secondary | ICD-10-CM

## 2022-11-05 ENCOUNTER — Telehealth: Payer: Self-pay | Admitting: Physical Medicine and Rehabilitation

## 2022-11-05 NOTE — Telephone Encounter (Signed)
Patient needs a refill on magnesium gluconate.  Please call her at [phone].

## 2022-11-09 DIAGNOSIS — E876 Hypokalemia: Secondary | ICD-10-CM | POA: Diagnosis not present

## 2022-11-09 DIAGNOSIS — M48061 Spinal stenosis, lumbar region without neurogenic claudication: Secondary | ICD-10-CM | POA: Diagnosis not present

## 2022-11-09 DIAGNOSIS — M419 Scoliosis, unspecified: Secondary | ICD-10-CM | POA: Diagnosis not present

## 2022-11-09 DIAGNOSIS — J45909 Unspecified asthma, uncomplicated: Secondary | ICD-10-CM | POA: Diagnosis not present

## 2022-11-09 DIAGNOSIS — I5032 Chronic diastolic (congestive) heart failure: Secondary | ICD-10-CM | POA: Diagnosis not present

## 2022-11-09 DIAGNOSIS — M109 Gout, unspecified: Secondary | ICD-10-CM | POA: Diagnosis not present

## 2022-11-09 DIAGNOSIS — Z96653 Presence of artificial knee joint, bilateral: Secondary | ICD-10-CM | POA: Diagnosis not present

## 2022-11-09 DIAGNOSIS — I130 Hypertensive heart and chronic kidney disease with heart failure and stage 1 through stage 4 chronic kidney disease, or unspecified chronic kidney disease: Secondary | ICD-10-CM | POA: Diagnosis not present

## 2022-11-09 DIAGNOSIS — Z7952 Long term (current) use of systemic steroids: Secondary | ICD-10-CM | POA: Diagnosis not present

## 2022-11-09 DIAGNOSIS — N184 Chronic kidney disease, stage 4 (severe): Secondary | ICD-10-CM | POA: Diagnosis not present

## 2022-11-09 DIAGNOSIS — Z7984 Long term (current) use of oral hypoglycemic drugs: Secondary | ICD-10-CM | POA: Diagnosis not present

## 2022-11-09 DIAGNOSIS — Z86718 Personal history of other venous thrombosis and embolism: Secondary | ICD-10-CM | POA: Diagnosis not present

## 2022-11-09 DIAGNOSIS — Z853 Personal history of malignant neoplasm of breast: Secondary | ICD-10-CM | POA: Diagnosis not present

## 2022-11-09 DIAGNOSIS — E538 Deficiency of other specified B group vitamins: Secondary | ICD-10-CM | POA: Diagnosis not present

## 2022-11-09 DIAGNOSIS — M069 Rheumatoid arthritis, unspecified: Secondary | ICD-10-CM | POA: Diagnosis not present

## 2022-11-09 DIAGNOSIS — M19011 Primary osteoarthritis, right shoulder: Secondary | ICD-10-CM | POA: Diagnosis not present

## 2022-11-09 DIAGNOSIS — M19012 Primary osteoarthritis, left shoulder: Secondary | ICD-10-CM | POA: Diagnosis not present

## 2022-11-09 DIAGNOSIS — E871 Hypo-osmolality and hyponatremia: Secondary | ICD-10-CM | POA: Diagnosis not present

## 2022-11-09 DIAGNOSIS — M25551 Pain in right hip: Secondary | ICD-10-CM | POA: Diagnosis not present

## 2022-11-09 DIAGNOSIS — G9341 Metabolic encephalopathy: Secondary | ICD-10-CM | POA: Diagnosis not present

## 2022-11-09 DIAGNOSIS — M19041 Primary osteoarthritis, right hand: Secondary | ICD-10-CM | POA: Diagnosis not present

## 2022-11-09 DIAGNOSIS — M19042 Primary osteoarthritis, left hand: Secondary | ICD-10-CM | POA: Diagnosis not present

## 2022-11-09 DIAGNOSIS — Z7982 Long term (current) use of aspirin: Secondary | ICD-10-CM | POA: Diagnosis not present

## 2022-11-09 DIAGNOSIS — D631 Anemia in chronic kidney disease: Secondary | ICD-10-CM | POA: Diagnosis not present

## 2022-11-12 DIAGNOSIS — G9341 Metabolic encephalopathy: Secondary | ICD-10-CM | POA: Diagnosis not present

## 2022-11-12 DIAGNOSIS — Z7952 Long term (current) use of systemic steroids: Secondary | ICD-10-CM | POA: Diagnosis not present

## 2022-11-12 DIAGNOSIS — M419 Scoliosis, unspecified: Secondary | ICD-10-CM | POA: Diagnosis not present

## 2022-11-12 DIAGNOSIS — M25551 Pain in right hip: Secondary | ICD-10-CM | POA: Diagnosis not present

## 2022-11-12 DIAGNOSIS — M19041 Primary osteoarthritis, right hand: Secondary | ICD-10-CM | POA: Diagnosis not present

## 2022-11-12 DIAGNOSIS — M109 Gout, unspecified: Secondary | ICD-10-CM | POA: Diagnosis not present

## 2022-11-12 DIAGNOSIS — E876 Hypokalemia: Secondary | ICD-10-CM | POA: Diagnosis not present

## 2022-11-12 DIAGNOSIS — J45909 Unspecified asthma, uncomplicated: Secondary | ICD-10-CM | POA: Diagnosis not present

## 2022-11-12 DIAGNOSIS — M19012 Primary osteoarthritis, left shoulder: Secondary | ICD-10-CM | POA: Diagnosis not present

## 2022-11-12 DIAGNOSIS — Z86718 Personal history of other venous thrombosis and embolism: Secondary | ICD-10-CM | POA: Diagnosis not present

## 2022-11-12 DIAGNOSIS — M48061 Spinal stenosis, lumbar region without neurogenic claudication: Secondary | ICD-10-CM | POA: Diagnosis not present

## 2022-11-12 DIAGNOSIS — M069 Rheumatoid arthritis, unspecified: Secondary | ICD-10-CM | POA: Diagnosis not present

## 2022-11-12 DIAGNOSIS — Z7984 Long term (current) use of oral hypoglycemic drugs: Secondary | ICD-10-CM | POA: Diagnosis not present

## 2022-11-12 DIAGNOSIS — I5032 Chronic diastolic (congestive) heart failure: Secondary | ICD-10-CM | POA: Diagnosis not present

## 2022-11-12 DIAGNOSIS — Z853 Personal history of malignant neoplasm of breast: Secondary | ICD-10-CM | POA: Diagnosis not present

## 2022-11-12 DIAGNOSIS — E538 Deficiency of other specified B group vitamins: Secondary | ICD-10-CM | POA: Diagnosis not present

## 2022-11-12 DIAGNOSIS — M19011 Primary osteoarthritis, right shoulder: Secondary | ICD-10-CM | POA: Diagnosis not present

## 2022-11-12 DIAGNOSIS — N184 Chronic kidney disease, stage 4 (severe): Secondary | ICD-10-CM | POA: Diagnosis not present

## 2022-11-12 DIAGNOSIS — M19042 Primary osteoarthritis, left hand: Secondary | ICD-10-CM | POA: Diagnosis not present

## 2022-11-12 DIAGNOSIS — Z96653 Presence of artificial knee joint, bilateral: Secondary | ICD-10-CM | POA: Diagnosis not present

## 2022-11-12 DIAGNOSIS — I130 Hypertensive heart and chronic kidney disease with heart failure and stage 1 through stage 4 chronic kidney disease, or unspecified chronic kidney disease: Secondary | ICD-10-CM | POA: Diagnosis not present

## 2022-11-12 DIAGNOSIS — E871 Hypo-osmolality and hyponatremia: Secondary | ICD-10-CM | POA: Diagnosis not present

## 2022-11-12 DIAGNOSIS — Z7982 Long term (current) use of aspirin: Secondary | ICD-10-CM | POA: Diagnosis not present

## 2022-11-12 DIAGNOSIS — D631 Anemia in chronic kidney disease: Secondary | ICD-10-CM | POA: Diagnosis not present

## 2022-11-16 ENCOUNTER — Encounter: Payer: Self-pay | Admitting: Podiatry

## 2022-11-16 ENCOUNTER — Ambulatory Visit: Payer: Medicare HMO | Admitting: Podiatry

## 2022-11-16 DIAGNOSIS — M76822 Posterior tibial tendinitis, left leg: Secondary | ICD-10-CM | POA: Diagnosis not present

## 2022-11-16 DIAGNOSIS — M2142 Flat foot [pes planus] (acquired), left foot: Secondary | ICD-10-CM

## 2022-11-16 DIAGNOSIS — I999 Unspecified disorder of circulatory system: Secondary | ICD-10-CM | POA: Diagnosis not present

## 2022-11-16 DIAGNOSIS — M79674 Pain in right toe(s): Secondary | ICD-10-CM

## 2022-11-16 DIAGNOSIS — B351 Tinea unguium: Secondary | ICD-10-CM

## 2022-11-16 DIAGNOSIS — M79675 Pain in left toe(s): Secondary | ICD-10-CM | POA: Diagnosis not present

## 2022-11-16 DIAGNOSIS — I739 Peripheral vascular disease, unspecified: Secondary | ICD-10-CM

## 2022-11-16 DIAGNOSIS — M76829 Posterior tibial tendinitis, unspecified leg: Secondary | ICD-10-CM

## 2022-11-16 NOTE — Progress Notes (Signed)
Chief Complaint  Patient presents with   Nail Problem    Nail trim      HPI: Patient is 87 y.o. female who presents today for elongated toenails which are and painful and symptomatic with shoe gear.  She also has a tender area on the tip of the right hallux which she is recently noted. She has PAD.   Relates pain today in the left ankle and foot. Relates very flat feet and has had orthotics in the past but has recently with the holidays had more pain in this foot.   Patient Active Problem List   Diagnosis Date Noted   Right leg pain 10/03/2022   Degenerative lumbar spinal stenosis 09/29/2022   Scoliosis of lumbar spine 09/29/2022   Snoring 07/16/2022   Daytime somnolence 04/30/2022   B12 deficiency 02/08/2022   Primary osteoarthritis of both shoulders 02/04/2022   Carcinoma in situ of left breast 93/26/7124   Acute metabolic encephalopathy    Sepsis with encephalopathy without septic shock ([place])    AMS (altered mental status) 07/29/2021   Altered mental status 07/27/2021   Chronic diastolic CHF (congestive heart failure) ([place]) 07/27/2021   Right hip pain 06/27/2021   Right knee pain 58/06/9832   Acute diastolic congestive heart failure ([place]) 06/18/2021   Calcium pyrophosphate deposition disease 02/12/2021   Fibromyalgia 02/12/2021   Polymyalgia rheumatica ([place]) 02/12/2021   Inflammatory arthritis 02/12/2021   Left knee pain 02/12/2021   Long term (current) use of systemic steroids 02/01/2021   Nail disorder 01/24/2021   Toe pain, right 01/24/2021   Chondrocalcinosis 12/19/2020   Primary osteoarthritis of both hands 12/19/2020   Pain in joint of left shoulder 08/22/2020   Pain in joint of right shoulder 08/22/2020   Shoulder pain 07/31/2020   Chronic pain 04/23/2020   Supraclavicular fossa fullness 04/23/2020   Finding of neck region 04/23/2020   Low grade fever 12/07/2019   OAB (overactive bladder) 12/07/2019   Pain of right heel 12/07/2019   Overactive bladder  12/07/2019   Diarrhea 04/13/2019   Abnormal gait 08/30/2018   Asthenia 08/30/2018   Conjunctivitis of left eye 04/13/2018   Rash 04/13/2018   Eruption at injection site 04/13/2018   Acute bilateral deep vein thrombosis (DVT) of femoral veins (HCC) 01/31/2018   Hypokalemia 01/31/2018   Electrocardiogram abnormal 01/31/2018   Increased creatine kinase level 01/31/2018   Left leg pain 01/13/2018   Left leg swelling 01/13/2018   Hearing loss 01/13/2018   Intractable pain 12/27/2017   Arachnoiditis 12/27/2017   Pain 12/27/2017   Hyponatremia    S/P lumbar laminectomy 12/23/2017   History of lumbar laminectomy 12/23/2017   Wheezing 12/10/2017   Itching 11/22/2017   Urinary frequency 11/22/2017   Increased frequency of urination 11/22/2017   Degeneration of lumbar intervertebral disc 11/05/2017   Sciatica 09/04/2017   Asthma exacerbation 08/31/2017   Acute asthma 08/31/2017   Flare of rheumatoid arthritis ([place]) 05/11/2017   Articular gout 05/01/2017   Vertigo 10/30/2016   Allergic rhinitis 10/30/2016   Incontinence of feces 07/31/2016   Hemorrhoids 07/31/2016   Hematochezia 07/31/2016   Peripheral venous insufficiency 82/50/5397   Diastolic dysfunction 67/34/1937   Peripheral edema 07/10/2016   Left ankle pain 06/02/2016   Neck pain 03/10/2016   Low back pain 03/10/2016   Right cervical radiculopathy 11/13/2015   Bradycardia 03/15/2015   Joint pain 12/18/2014   Fever 11/27/2014   Impaired glucose tolerance 07/18/2014   Right lumbar radiculopathy 07/18/2014   Left lumbar radiculopathy  07/18/2014   Encounter for well adult exam with abnormal findings 02/20/2014   Malignant neoplasm of upper-outer quadrant of female breast ([place]) 11/08/2013   Diverticular disease 07/09/2012   Headache(784.0) 06/30/2012   Anemia 06/29/2012   Dyspnea 03/11/2012   Cough 03/11/2012   CKD (chronic kidney disease) stage 4, GFR 15-29 ml/min (HCC) 05/18/2011   Chronic kidney disease, stage 4  (severe) ([place]) 05/18/2011   Gastroesophageal reflux disease 07/05/2008   Vitamin D deficiency 12/19/2007   Morbid obesity ([place]) 12/16/2007   Essential hypertension 12/16/2007   DEGENERATIVE JOINT DISEASE 12/16/2007   Osteoarthritis 12/16/2007    Current Outpatient Medications on File Prior to Visit  Medication Sig Dispense Refill   albuterol (VENTOLIN HFA) 108 (90 Base) MCG/ACT inhaler Inhale 1 puff into the lungs every 6 (six) hours as needed for wheezing or shortness of breath.     allopurinol (ZYLOPRIM) 100 MG tablet Take 100 mg by mouth daily.     amLODipine (NORVASC) 5 MG tablet TAKE 1 TABLET BY MOUTH EVERY DAY (Patient taking differently: Take 5 mg by mouth every morning.) 90 tablet 3   aspirin EC 81 MG tablet Take 81 mg by mouth every morning. Swallow whole.     Cholecalciferol (VITAMIN D3) 20 MCG (800 UNIT) TABS Take 1 tablet by mouth daily.     diclofenac Sodium (VOLTAREN) 1 % GEL Inhale 1 puff into the lungs 2 (two) times daily.     diclofenac Sodium (VOLTAREN) 1 % GEL APPLY 2 GRAMS TO AFFECTED AREA 4 TIMES A DAY 100 g 5   Ensure (ENSURE) Take 1 Can by mouth 3 (three) times daily between meals. (Patient taking differently: Take 237 mLs by mouth See admin instructions. Drink one can (237 mls) by mouth once or twice daily) 237 mL 12   FARXIGA 10 MG TABS tablet Take 10 mg by mouth daily.     febuxostat (ULORIC) 40 MG tablet Take 40 mg by mouth daily.     fluticasone-salmeterol (WIXELA INHUB) 250-50 MCG/ACT AEPB Inhale 1 puff into the lungs in the morning and at bedtime. 3 each 3   furosemide (LASIX) 40 MG tablet Take 1 tablet (40 mg total) by mouth every Monday, Wednesday, and Friday.     gabapentin (NEURONTIN) 300 MG capsule TAKE 1 CAPSULE BY MOUTH THREE TIMES A DAY 90 capsule 5   Incontinence Supply Disposable (DEPEND UNDERWEAR SM/MED) MISC Use as directed four times per day 120 each 5   losartan (COZAAR) 100 MG tablet Take 100 mg by mouth daily.     magnesium gluconate (MAGONATE)  500 MG tablet TAKE 1 TABLET BY MOUTH TWICE A DAY 180 tablet 1   meclizine (ANTIVERT) 12.5 MG tablet TAKE 1 TABLET BY MOUTH THREE TIMES A DAY AS NEEDED FOR DIZZINESS (Patient taking differently: Take 12.5 mg by mouth 3 (three) times daily as needed for dizziness.) 90 tablet 2   montelukast (SINGULAIR) 10 MG tablet TAKE 1 TABLET BY MOUTH EVERY DAY 90 tablet 2   Multiple Vitamin (MULTIVITAMIN WITH MINERALS) TABS tablet Take 1 tablet by mouth daily. Centrum Silver     pantoprazole (PROTONIX) 40 MG tablet TAKE 1 TABLET BY MOUTH EVERY DAY 90 tablet 3   potassium chloride (KLOR-CON) 10 MEQ tablet TAKE 1 TABLET BY MOUTH EVERY DAY WHEN TAKING FUROSEMIDE (Patient taking differently: Take 10 mEq by mouth every Monday, Wednesday, and Friday.) 90 tablet 3   predniSONE (DELTASONE) 1 MG tablet Take 1 tablet (1 mg total) by mouth daily as needed. [place]  tablet 3   triamcinolone cream (KENALOG) 0.1 % APPLY TO AFFECTED AREA TWICE A DAY 30 g 1   No current facility-administered medications on file prior to visit.    Allergies  Allergen Reactions   Hydrocodone Itching   Lasix [Furosemide] Other (See Comments)    Dizziness.    Tizanidine Other (See Comments)    Dizzy and fall   Iron Hives and Other (See Comments)     bad constipation     Review of Systems No fevers, chills, nausea, muscle aches, no difficulty breathing, no calf pain, no chest pain or shortness of breath.   Physical Exam  GENERAL APPEARANCE: Alert, conversant. Appropriately groomed. No acute distress.   VASCULAR: Pedal pulses palpable 1/4 DP and 0/4 PT bilateral.  Capillary refill time is immediate to all digits,  Proximal to distal cooling it warm to warm.  Digital perfusion adequate.   NEUROLOGIC: sensation is decreased to 5.07 monofilament at 2/5 sites bilateral.  Light touch is intact bilateral, vibratory sensation absent bilateral  MUSCULOSKELETAL: acceptable muscle strength, tone and stability bilateral.  planus foot type is noted  Tender to PT tendon along course and to insertion. Pain with inversion and plantar flexion. Sever pes planus on left  DERMATOLOGIC: skin is warm, supple, and dry.  Color, texture, and turgor of skin within normal limits.  Digital nails are thick, discolored, dystrophic, brittle with subungual debris present and clinically mycotic x 10.     Tip of right great toe- non blanchable erythema, no calor, appears to have been a blister that has resolved and is now healing.  No redness, no swelling, no drainage.  No sign of infection noted.    Assessment     ICD-10-CM   1. Pain due to onychomycosis of toenails of both feet  B35.1    M79.675    M79.674     2. Vascular disease  I99.9     3. PAD (peripheral artery disease) (HCC)  I73.9     4. PTTD (posterior tibial tendon dysfunction)  M76.829     5. Acquired pes planus, left  M21.42         Plan -Discussed and educated patient on foot care, especially with  regards to the vascular, neurological and musculoskeletal systems.  -Discussed supportive shoes at all times and checking feet regularly.  -Mechanically debrided all nails 1-5 bilateral using sterile nail nipper and filed with dremel without incident  -Answered all patient questions -Patient to return  in 3 months for at risk foot care -Patient advised to call the office if any problems or questions arise in the meantime.  Discussed PTTD diagnosis and treatment options with patient. Stretching exercises discussed and handout dispensed. Tri-Lock ankle brace dispensed. Discussed if there is no improvement PT/MRI/injection may be an option. Patient to return to clinic in 6 to 8 weeks or sooner if symptoms fail to improve or worsen.

## 2022-11-16 NOTE — Patient Instructions (Signed)
Posterior Tibial Tendinitis Rehab Ask your health care provider which exercises are safe for you. Do exercises exactly as told by your health care provider and adjust them as directed. It is normal to feel mild stretching, pulling, tightness, or discomfort as you do these exercises. Stop right away if you feel sudden pain or your pain gets worse. Do not begin these exercises until told by your health care provider. Stretching and range-of-motion exercises These exercises warm up your muscles and joints and improve the movement and flexibility in your ankle and foot. These exercises may also help to relieve pain. Standing wall calf stretch, knee straight  Stand with your hands against a wall. Extend your left / right leg behind you, and bend your front knee slightly. If directed, place a folded washcloth under the arch of your foot for support. Point the toes of your back foot slightly inward. Keeping your heels on the floor and your back knee straight, shift your weight toward the wall. Do not allow your back to arch. You should feel a gentle stretch in your upper left / right calf. Hold this position for __________ seconds. Repeat __________ times. Complete this exercise __________ times a day. Standing wall calf stretch, knee bent Stand with your hands against a wall. Extend your left / right leg behind you, and bend your front knee slightly. If directed, place a folded washcloth under the arch of your foot for support. Point the toes of your back foot slightly inward. Unlock your back knee so it is bent. Keep your heels on the floor. You should feel a gentle stretch deep in your lower left / right calf. Hold this position for __________ seconds. Repeat __________ times. Complete this exercise __________ times a day. Strengthening exercises These exercises build strength and endurance in your ankle and foot. Endurance is the ability to use your muscles for a long time, even after they get  tired. Ankle inversion with band Secure one end of a rubber exercise band or tubing to a fixed object, such as a table leg or a pole, that will stay still when the band is pulled. Loop the other end of the band around the middle of your left / right foot. Sit on the floor facing the object with your left / right leg extended. The band or tube should be slightly tense when your foot is relaxed. Leading with your big toe, slowly bring your left / right foot and ankle inward, toward your other foot (inversion). Hold this position for __________ seconds. Slowly return your foot to the starting position. Repeat __________ times. Complete this exercise __________ times a day. Towel curls  Sit in a chair on a non-carpeted surface, and put your feet on the floor. Place a towel in front of your feet. If told by your health care provider, add a __________ weight to the end of the towel. Keeping your heel on the floor, put your left / right foot on the towel. Pull the towel toward you by grabbing the towel with your toes and curling them under. Keep your heel on the floor while you do this. Let your toes relax. Grab the towel with your toes again. Keep going until the towel is completely underneath your foot. Repeat __________ times. Complete this exercise __________ times a day. Balance exercise This exercise improves or maintains your balance. Balance is important in preventing falls. Single leg stand Without wearing shoes, stand near a railing or in a doorway. You may hold   on to the railing or door frame as needed for balance. Stand on your left / right foot. Keep your big toe down on the floor and try to keep your arch lifted. If balancing in this position is too easy, try the exercise with your eyes closed or while standing on a pillow. Hold this position for __________ seconds. Repeat __________ times. Complete this exercise __________ times a day. This information is not intended to replace  advice given to you by your health care provider. Make sure you discuss any questions you have with your health care provider. Document Revised: 02/07/2019 Document Reviewed: 12/14/2018 Elsevier Patient Education  2023 [employer] Inc.  

## 2022-11-17 IMAGING — DX DG CHEST 2V
2 series · 2 of 2 positions shown · non-contrast
Comparison: 05/30/2018 chest radiograph.

CLINICAL DATA: Cough for 2 weeks with bibasilar crackles, history
of breast cancer

EXAM:
CHEST - 2 VIEW

[chest pa]
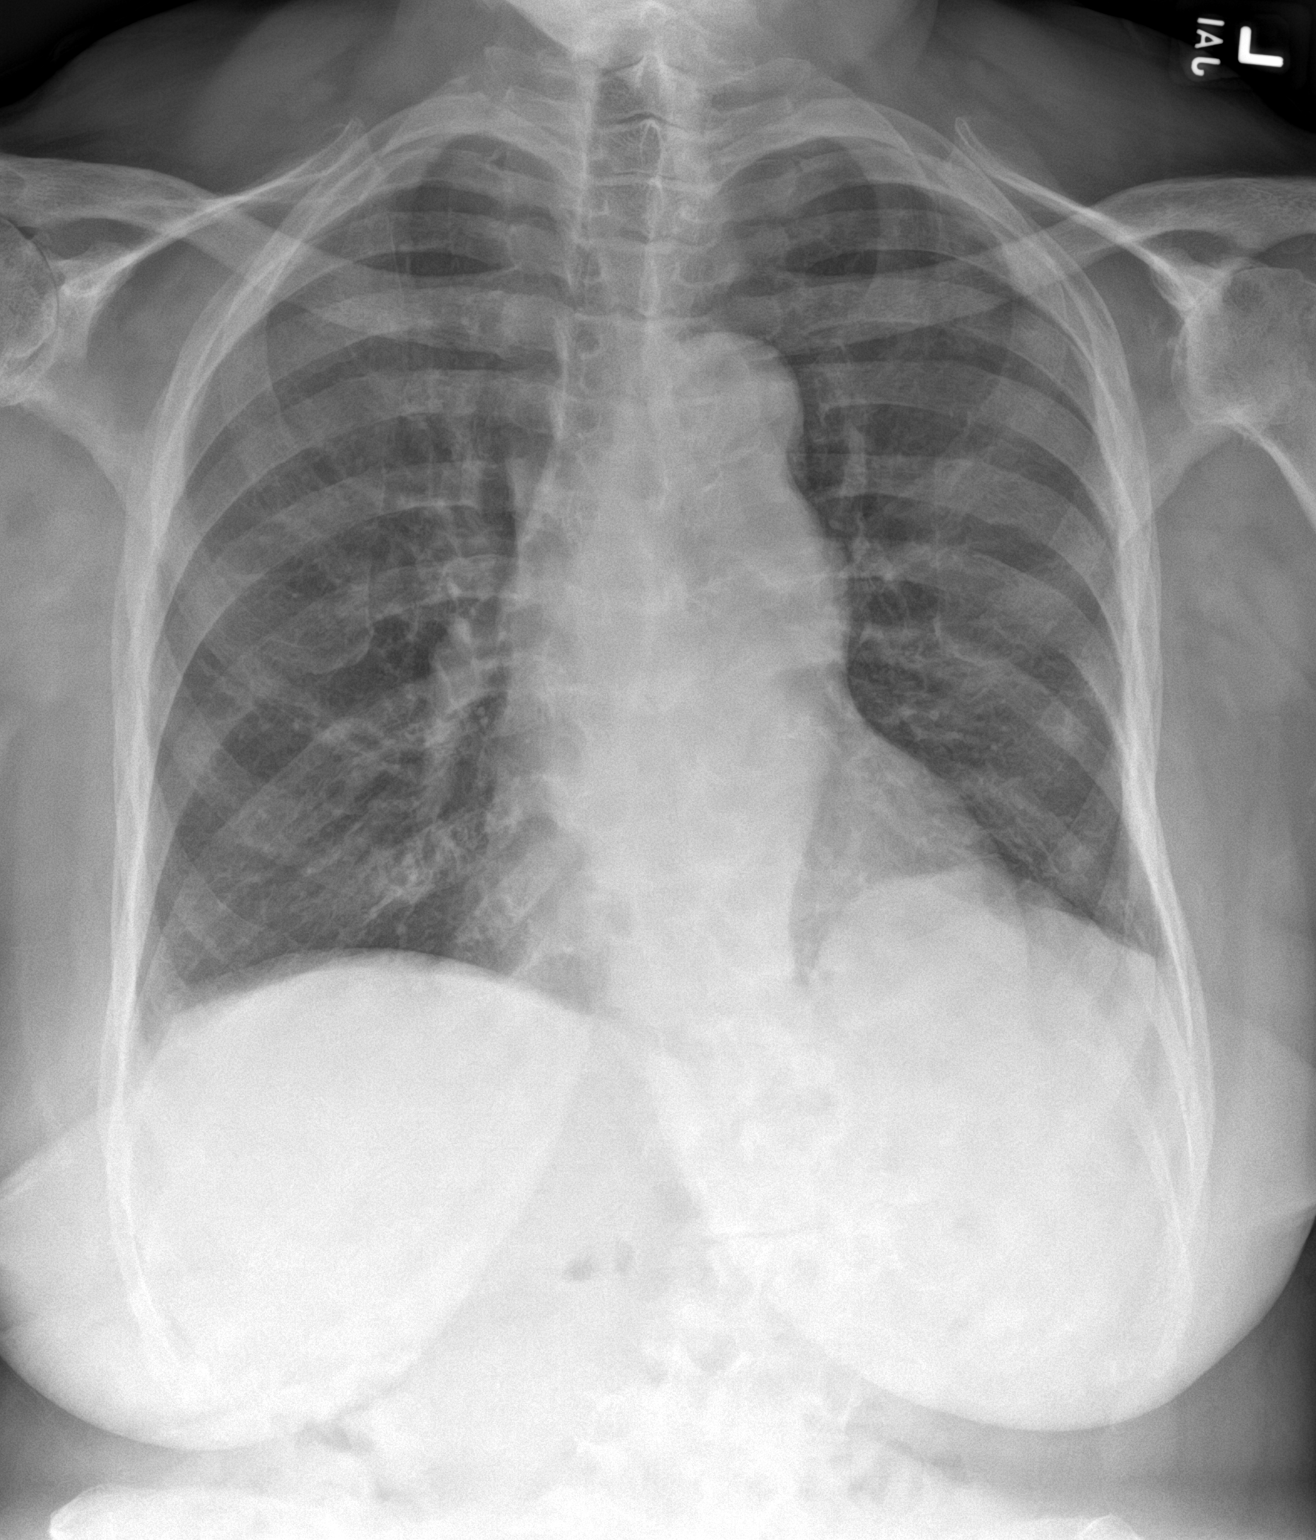

[chest lat]
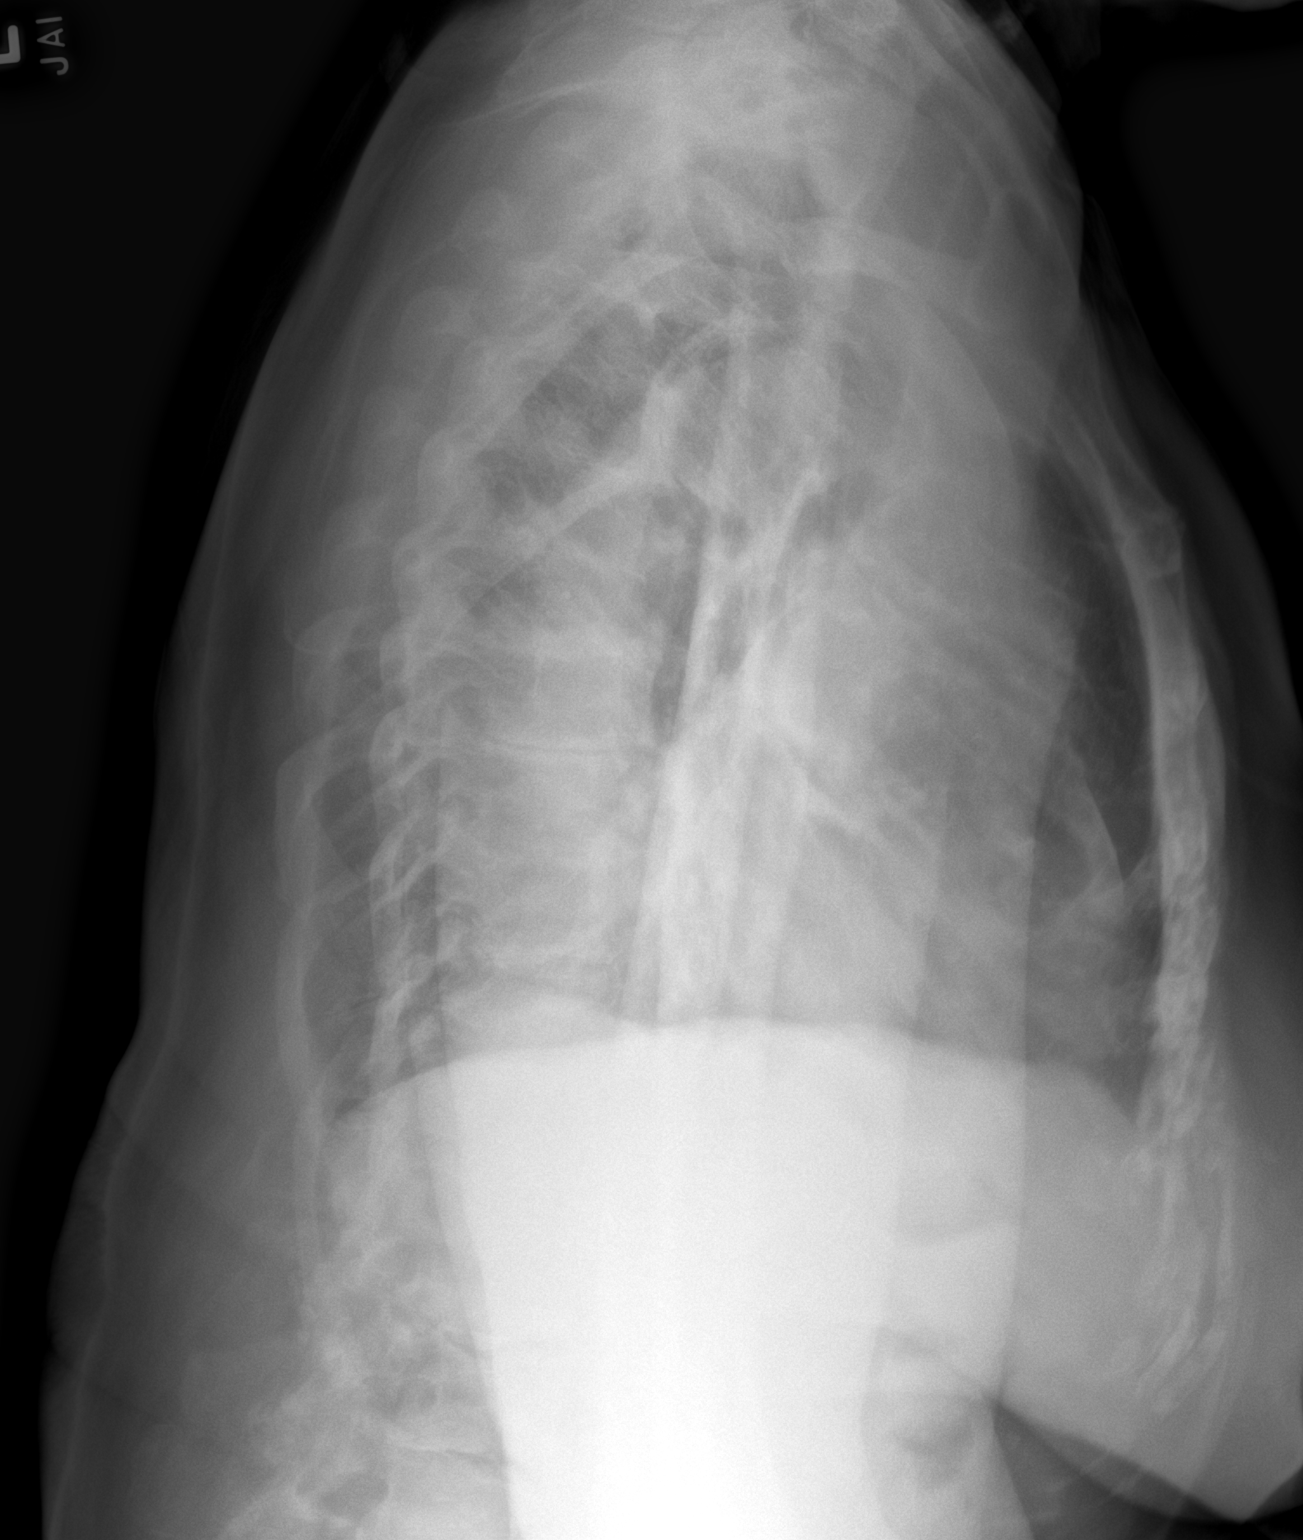

[2 of 2 positions shown; findings below may reference images not displayed]

FINDINGS: Stable cardiomediastinal silhouette with normal heart size. No
pneumothorax. No pleural effusion. Lungs appear clear, with no acute
consolidative airspace disease and no pulmonary edema.
IMPRESSION: No active cardiopulmonary disease.

## 2022-11-18 DIAGNOSIS — M25551 Pain in right hip: Secondary | ICD-10-CM | POA: Diagnosis not present

## 2022-11-18 DIAGNOSIS — J45909 Unspecified asthma, uncomplicated: Secondary | ICD-10-CM | POA: Diagnosis not present

## 2022-11-18 DIAGNOSIS — E871 Hypo-osmolality and hyponatremia: Secondary | ICD-10-CM | POA: Diagnosis not present

## 2022-11-18 DIAGNOSIS — E538 Deficiency of other specified B group vitamins: Secondary | ICD-10-CM | POA: Diagnosis not present

## 2022-11-18 DIAGNOSIS — M419 Scoliosis, unspecified: Secondary | ICD-10-CM | POA: Diagnosis not present

## 2022-11-18 DIAGNOSIS — M109 Gout, unspecified: Secondary | ICD-10-CM | POA: Diagnosis not present

## 2022-11-18 DIAGNOSIS — N184 Chronic kidney disease, stage 4 (severe): Secondary | ICD-10-CM | POA: Diagnosis not present

## 2022-11-18 DIAGNOSIS — M19041 Primary osteoarthritis, right hand: Secondary | ICD-10-CM | POA: Diagnosis not present

## 2022-11-18 DIAGNOSIS — D631 Anemia in chronic kidney disease: Secondary | ICD-10-CM | POA: Diagnosis not present

## 2022-11-18 DIAGNOSIS — Z7952 Long term (current) use of systemic steroids: Secondary | ICD-10-CM | POA: Diagnosis not present

## 2022-11-18 DIAGNOSIS — M069 Rheumatoid arthritis, unspecified: Secondary | ICD-10-CM | POA: Diagnosis not present

## 2022-11-18 DIAGNOSIS — M19042 Primary osteoarthritis, left hand: Secondary | ICD-10-CM | POA: Diagnosis not present

## 2022-11-18 DIAGNOSIS — Z96653 Presence of artificial knee joint, bilateral: Secondary | ICD-10-CM | POA: Diagnosis not present

## 2022-11-18 DIAGNOSIS — M48061 Spinal stenosis, lumbar region without neurogenic claudication: Secondary | ICD-10-CM | POA: Diagnosis not present

## 2022-11-18 DIAGNOSIS — E876 Hypokalemia: Secondary | ICD-10-CM | POA: Diagnosis not present

## 2022-11-18 DIAGNOSIS — I130 Hypertensive heart and chronic kidney disease with heart failure and stage 1 through stage 4 chronic kidney disease, or unspecified chronic kidney disease: Secondary | ICD-10-CM | POA: Diagnosis not present

## 2022-11-18 DIAGNOSIS — Z7984 Long term (current) use of oral hypoglycemic drugs: Secondary | ICD-10-CM | POA: Diagnosis not present

## 2022-11-18 DIAGNOSIS — Z853 Personal history of malignant neoplasm of breast: Secondary | ICD-10-CM | POA: Diagnosis not present

## 2022-11-18 DIAGNOSIS — M19011 Primary osteoarthritis, right shoulder: Secondary | ICD-10-CM | POA: Diagnosis not present

## 2022-11-18 DIAGNOSIS — M19012 Primary osteoarthritis, left shoulder: Secondary | ICD-10-CM | POA: Diagnosis not present

## 2022-11-18 DIAGNOSIS — I5032 Chronic diastolic (congestive) heart failure: Secondary | ICD-10-CM | POA: Diagnosis not present

## 2022-11-18 DIAGNOSIS — Z7982 Long term (current) use of aspirin: Secondary | ICD-10-CM | POA: Diagnosis not present

## 2022-11-18 DIAGNOSIS — Z86718 Personal history of other venous thrombosis and embolism: Secondary | ICD-10-CM | POA: Diagnosis not present

## 2022-11-18 DIAGNOSIS — G9341 Metabolic encephalopathy: Secondary | ICD-10-CM | POA: Diagnosis not present

## 2022-11-19 ENCOUNTER — Telehealth: Payer: Self-pay | Admitting: Podiatry

## 2022-11-19 NOTE — Telephone Encounter (Signed)
Patient left message on voicemail she would like to know how long she should wear the brace she was given? Is she suppose to sleep in it?   Is she allowed to leave it off for short periods of time?

## 2022-11-20 NOTE — Telephone Encounter (Signed)
She should wear it until I see her back but she only needs to wear it when up walking for longer periods of time. She does not need to wear it to sleep and if she does ok she does not need it for short periods of time. Thanks

## 2022-11-21 ENCOUNTER — Other Ambulatory Visit: Payer: Self-pay | Admitting: Internal Medicine

## 2022-11-21 NOTE — Telephone Encounter (Signed)
Please refill as per office routine med refill policy (all routine meds to be refilled for 3 mo or monthly (per pt preference) up to one year from last visit, then month to month grace period for 3 mo, then further med refills will have to be denied)

## 2022-11-23 NOTE — Telephone Encounter (Signed)
She can try some espsom salts and warm water soaks and keep some neosporin and a bandaid on the toes. To see if this helps. If she continues to have trouble she can come in to have them evaluated. Thanks

## 2022-11-30 ENCOUNTER — Telehealth: Payer: Self-pay

## 2022-11-30 DIAGNOSIS — E871 Hypo-osmolality and hyponatremia: Secondary | ICD-10-CM | POA: Diagnosis not present

## 2022-11-30 DIAGNOSIS — M19041 Primary osteoarthritis, right hand: Secondary | ICD-10-CM | POA: Diagnosis not present

## 2022-11-30 DIAGNOSIS — Z853 Personal history of malignant neoplasm of breast: Secondary | ICD-10-CM | POA: Diagnosis not present

## 2022-11-30 DIAGNOSIS — G9341 Metabolic encephalopathy: Secondary | ICD-10-CM | POA: Diagnosis not present

## 2022-11-30 DIAGNOSIS — M25551 Pain in right hip: Secondary | ICD-10-CM | POA: Diagnosis not present

## 2022-11-30 DIAGNOSIS — M069 Rheumatoid arthritis, unspecified: Secondary | ICD-10-CM | POA: Diagnosis not present

## 2022-11-30 DIAGNOSIS — I5032 Chronic diastolic (congestive) heart failure: Secondary | ICD-10-CM | POA: Diagnosis not present

## 2022-11-30 DIAGNOSIS — Z7984 Long term (current) use of oral hypoglycemic drugs: Secondary | ICD-10-CM | POA: Diagnosis not present

## 2022-11-30 DIAGNOSIS — D631 Anemia in chronic kidney disease: Secondary | ICD-10-CM | POA: Diagnosis not present

## 2022-11-30 DIAGNOSIS — N184 Chronic kidney disease, stage 4 (severe): Secondary | ICD-10-CM | POA: Diagnosis not present

## 2022-11-30 DIAGNOSIS — M419 Scoliosis, unspecified: Secondary | ICD-10-CM | POA: Diagnosis not present

## 2022-11-30 DIAGNOSIS — I130 Hypertensive heart and chronic kidney disease with heart failure and stage 1 through stage 4 chronic kidney disease, or unspecified chronic kidney disease: Secondary | ICD-10-CM | POA: Diagnosis not present

## 2022-11-30 DIAGNOSIS — M19011 Primary osteoarthritis, right shoulder: Secondary | ICD-10-CM | POA: Diagnosis not present

## 2022-11-30 DIAGNOSIS — M48061 Spinal stenosis, lumbar region without neurogenic claudication: Secondary | ICD-10-CM | POA: Diagnosis not present

## 2022-11-30 DIAGNOSIS — Z86718 Personal history of other venous thrombosis and embolism: Secondary | ICD-10-CM | POA: Diagnosis not present

## 2022-11-30 DIAGNOSIS — M109 Gout, unspecified: Secondary | ICD-10-CM | POA: Diagnosis not present

## 2022-11-30 DIAGNOSIS — J45909 Unspecified asthma, uncomplicated: Secondary | ICD-10-CM | POA: Diagnosis not present

## 2022-11-30 DIAGNOSIS — M19012 Primary osteoarthritis, left shoulder: Secondary | ICD-10-CM | POA: Diagnosis not present

## 2022-11-30 DIAGNOSIS — E876 Hypokalemia: Secondary | ICD-10-CM | POA: Diagnosis not present

## 2022-11-30 DIAGNOSIS — Z96653 Presence of artificial knee joint, bilateral: Secondary | ICD-10-CM | POA: Diagnosis not present

## 2022-11-30 DIAGNOSIS — Z7982 Long term (current) use of aspirin: Secondary | ICD-10-CM | POA: Diagnosis not present

## 2022-11-30 DIAGNOSIS — M19042 Primary osteoarthritis, left hand: Secondary | ICD-10-CM | POA: Diagnosis not present

## 2022-11-30 DIAGNOSIS — Z7952 Long term (current) use of systemic steroids: Secondary | ICD-10-CM | POA: Diagnosis not present

## 2022-11-30 DIAGNOSIS — E538 Deficiency of other specified B group vitamins: Secondary | ICD-10-CM | POA: Diagnosis not present

## 2022-11-30 NOTE — Telephone Encounter (Signed)
Gave verbal okay to [facility] to continue PT for 5 more weeks

## 2022-11-30 NOTE — Telephone Encounter (Signed)
[facility] called stating patient had a missed appointment last week and they will re-start up the therapy this week

## 2022-12-03 DIAGNOSIS — Z01419 Encounter for gynecological examination (general) (routine) without abnormal findings: Secondary | ICD-10-CM | POA: Diagnosis not present

## 2022-12-03 DIAGNOSIS — Z6829 Body mass index (BMI) 29.0-29.9, adult: Secondary | ICD-10-CM | POA: Diagnosis not present

## 2022-12-07 ENCOUNTER — Encounter: Payer: Medicare HMO | Attending: Physical Medicine and Rehabilitation | Admitting: Physical Medicine and Rehabilitation

## 2022-12-07 ENCOUNTER — Encounter: Payer: Self-pay | Admitting: Physical Medicine and Rehabilitation

## 2022-12-07 VITALS — BP 152/73 | HR 79 | Ht 61.0 in | Wt 158.0 lb

## 2022-12-07 DIAGNOSIS — I10 Essential (primary) hypertension: Secondary | ICD-10-CM | POA: Insufficient documentation

## 2022-12-07 DIAGNOSIS — R0683 Snoring: Secondary | ICD-10-CM | POA: Insufficient documentation

## 2022-12-07 DIAGNOSIS — M353 Polymyalgia rheumatica: Secondary | ICD-10-CM | POA: Diagnosis not present

## 2022-12-07 MED ORDER — LIDOCAINE 5 % EX PTCH: 1.0000 | MEDICATED_PATCH | CUTANEOUS | 0 refills | Status: DC

## 2022-12-07 MED ORDER — PREDNISONE 1 MG PO TABS: 1.0000 mg | ORAL_TABLET | Freq: Every day | ORAL | 3 refills | Status: DC

## 2022-12-07 NOTE — Patient Instructions (Signed)
HTN: -BP is 152/73 today.  -Advised checking BP daily at home and logging results to bring into follow-up appointment with PCP and myself. -Reviewed BP meds today.  -Advised regarding healthy foods that can help lower blood pressure and provided with a list: 1) citrus foods- high in vitamins and minerals 2) salmon and other fatty fish - reduces inflammation and oxylipins 3) swiss chard (leafy green)- high level of nitrates 4) pumpkin seeds- one of the best natural sources of magnesium 5) Beans and lentils- high in fiber, magnesium, and potassium 6) Berries- high in flavonoids 7) Amaranth (whole grain, can be cooked similarly to rice and oats)- high in magnesium and fiber 8) Pistachios- even more effective at reducing BP than other nuts 9) Carrots- high in phenolic compounds that relax blood vessels and reduce inflammation 10) Celery- contain phthalides that relax tissues of arterial walls 11) Tomatoes- can also improve cholesterol and reduce risk of heart disease 12) Broccoli- good source of magnesium, calcium, and potassium 13) Greek yogurt: high in potassium and calcium 14) Herbs and spices: Celery seed, cilantro, saffron, lemongrass, black cumin, ginseng, cinnamon, cardamom, sweet basil, and ginger 15) Chia and flax seeds- also help to lower cholesterol and blood sugar 16) Beets- high levels of nitrates that relax blood vessels  17) spinach and bananas- high in potassium  -Provided lise of supplements that can help with hypertension:  1) magnesium: one high quality brand is Bioptemizers since it contains all 7 types of magnesium, otherwise over the counter magnesium gluconate 450m is a good option 2) B vitamins 3) vitamin D 4) potassium 5) CoQ10 6) L-arginine 7) Vitamin C 8) Beetroot -Educated that goal BP is 120/80. -Made goal to incorporate some of the above foods into diet.

## 2022-12-07 NOTE — Addendum Note (Signed)
Addended by: Izora Ribas on: 12/07/2022 10:41 AM   Modules accepted: Orders

## 2022-12-07 NOTE — Progress Notes (Signed)
Subjective:    Patient ID: [name], female    DOB: [birth year], 87 y.o.   MRN: [id number]  Mrs. [name] is an 87 year old woman who presents for follow-up of pain in her bilateral shoulders.   -she is currently having pain in her shoulders and hips -had no benefit from right hip steroid injection -oral steroids help. That is the thing that really helps her. She has been taking 4m daily. She is worried about taking too much steroids -hasn't used lidocaine patches  She had no benefit from the corticosteroid shots. She felt sick afterward. Her daughter has to bring her to her appointments so she was not able to come in earlier. She is taking oral prednisone- she started yesterday. Has not yet been able to feel any benefit. She currently feels pain in both shoulders and thighs.   She prefers less medications. She is willing to try gel and patches and anti-inflammatory foods.   She is having difficulty raising her hands and thus performing ADLs due to her pain. She does have excellent family support for which she is very thankful.    Her daughter calls in today.   Her doctor stopped the prednisone because of her kidneys. The prednisone really helped. Her daughter would prefer using the steroids as needed rather than trying something else   Pain Inventory Average Pain 7 Pain Right Now 5 My pain is intermittent, sharp, and stabbing  In the last 24 hours, has pain interfered with the following? General activity 6 Relation with others 0 Enjoyment of life 6 What TIME of day is your pain at its worst? morning  Sleep (in general) Good  Pain is worse with: walking, bending, standing, and some activites Pain improves with: heat/ice and medication Relief from Meds: 6  Family History  Problem Relation Age of Onset   Colon cancer Mother        in her 841's  Stomach cancer Mother    Lung cancer Brother        was a smoker   Heart attack Son 534  Healthy Daughter    Social  History   Socioeconomic History   Marital status: Widowed    Spouse name: Not on file   Number of children: 2   Years of education: Not on file   Highest education level: Not on file  Occupational History   Occupation: owns bakery and works PT for [employer]  Tobacco Use   Smoking status: Never    Passive exposure: Never   Smokeless tobacco: Never  Vaping Use   Vaping Use: Never used  Substance and Sexual Activity   Alcohol use: No    Alcohol/week: 0.0 standard drinks of alcohol   Drug use: No   Sexual activity: Not Currently  Other Topics Concern   Not on file  Social History Narrative   Not on file   Social Determinants of Health   Financial Resource Strain: Low Risk  (05/29/2022)   Overall Financial Resource Strain (CARDIA)    Difficulty of Paying Living Expenses: Not hard at all  Food Insecurity: No Food Insecurity (05/29/2022)   Hunger Vital Sign    Worried About Running Out of Food in the Last Year: Never true    [place] the Last [place]: Never true  Transportation Needs: No Transportation Needs (05/29/2022)   PRAPARE - THydrologist(Medical): No    Lack of Transportation (Non-Medical):  No  Physical Activity: Insufficiently Active (05/29/2022)   Exercise Vital Sign    Days of Exercise per Week: 4 days    Minutes of Exercise per Session: 20 min  Stress: No Stress Concern Present (05/29/2022)   [place]    Feeling of Stress : Not at all  Social Connections: Moderately Integrated (05/29/2022)   Social Connection and Isolation Panel [NHANES]    Frequency of Communication with Friends and Family: More than three times a week    Frequency of Social Gatherings with Friends and Family: More than three times a week    Attends Religious Services: More than 4 times per year    Active Member of [employer] or Organizations: Yes    Attends Archivist Meetings: More than 4  times per year    Marital Status: Widowed   Past Surgical History:  Procedure Laterality Date   ABDOMINAL HYSTERECTOMY     BREAST LUMPECTOMY WITH NEEDLE LOCALIZATION AND AXILLARY SENTINEL LYMPH NODE BX Left 12/04/2013   Procedure: BREAST LUMPECTOMY WITH NEEDLE LOCALIZATION AND AXILLARY SENTINEL LYMPH NODE BX;  Surgeon: Shann Medal, MD;  Location: [place];  Service: General;  Laterality: Left;   CATARACT EXTRACTION  2009   rt   COLONOSCOPY     EYE SURGERY Bilateral    cataract surgery   KNEE ARTHROSCOPY     both   LUMBAR LAMINECTOMY/DECOMPRESSION MICRODISCECTOMY Left 12/23/2017   Procedure: Left Lumbar One-Two Laminectomy with microdiscectomy;  Surgeon: Eustace Moore, MD;  Location: [place];  Service: Neurosurgery;  Laterality: Left;  Left L1-2 Laminectomy with microdiscectomy   TONSILLECTOMY     TOTAL KNEE ARTHROPLASTY  2002   rt   TOTAL KNEE ARTHROPLASTY  2003   left   VESICOVAGINAL FISTULA CLOSURE W/ TAH  1980   Past Surgical History:  Procedure Laterality Date   ABDOMINAL HYSTERECTOMY     BREAST LUMPECTOMY WITH NEEDLE LOCALIZATION AND AXILLARY SENTINEL LYMPH NODE BX Left 12/04/2013   Procedure: BREAST LUMPECTOMY WITH NEEDLE LOCALIZATION AND AXILLARY SENTINEL LYMPH NODE BX;  Surgeon: Shann Medal, MD;  Location: [place];  Service: General;  Laterality: Left;   CATARACT EXTRACTION  2009   rt   COLONOSCOPY     EYE SURGERY Bilateral    cataract surgery   KNEE ARTHROSCOPY     both   LUMBAR LAMINECTOMY/DECOMPRESSION MICRODISCECTOMY Left 12/23/2017   Procedure: Left Lumbar One-Two Laminectomy with microdiscectomy;  Surgeon: Eustace Moore, MD;  Location: [place];  Service: Neurosurgery;  Laterality: Left;  Left L1-2 Laminectomy with microdiscectomy   TONSILLECTOMY     TOTAL KNEE ARTHROPLASTY  2002   rt   TOTAL KNEE ARTHROPLASTY  2003   left   VESICOVAGINAL FISTULA CLOSURE W/ TAH  1980   Past Medical History:  Diagnosis Date   Allergic  rhinitis 10/30/2016   Anemia    Asthma    Breast cancer of upper-outer quadrant of left female breast ([place]) 11/08/2013   ER/PR+ Her2- Left IDC    Chronic renal insufficiency    Chronic rhinitis    Colon polyp    Diastolic dysfunction [id number]   DJD (degenerative joint disease)    Dyspnea    Frozen shoulder    Full dentures    GERD (gastroesophageal reflux disease)    Hearing loss    Hypertension    Hyponatremia    Impaired glucose tolerance 07/18/2014   Memory loss  Morbid obesity (HCC)    Poor circulation    Vertigo    Wears glasses    BP (!) 152/73   Pulse 79   Ht 5' 1"$  (1.549 m)   Wt 158 lb (71.7 kg)   SpO2 96%   BMI 29.85 kg/m   Opioid Risk Score:   Fall Risk Score:  `1  Depression screen [facility] 2/9     12/07/2022   10:05 AM 09/29/2022    8:50 AM 05/29/2022   11:44 AM 04/30/2022    2:59 PM 04/23/2022   10:08 AM 02/02/2022    9:47 AM 05/20/2021    9:42 AM  Depression screen PHQ 2/9  Decreased Interest 0 0 0 0 0 0 0  Down, Depressed, Hopeless 0 0 0 0 0 0 0  PHQ - 2 Score 0 0 0 0 0 0 0  Altered sleeping  0 0 0     Tired, decreased energy  0 0 0     Change in appetite  0 0 0     Feeling bad or failure about yourself   0 0 0     Trouble concentrating  0 0 0     Moving slowly or fidgety/restless  0 0 0     Suicidal thoughts  0 0 0     PHQ-9 Score  0 0 0     Difficult doing work/chores  Not difficult at all Not difficult at all       Review of Systems  Constitutional: Negative.   HENT: Negative.    Eyes: Negative.   Respiratory: Negative.    Cardiovascular: Negative.   Gastrointestinal: Negative.   Endocrine: Negative.   Genitourinary: Negative.   Musculoskeletal:  Positive for arthralgias, back pain and gait problem.  Skin: Negative.   Allergic/Immunologic: Negative.   Hematological: Negative.   Psychiatric/Behavioral: Negative.         Objective:   Physical Exam Gen: no distress, normal appearing, BMI 30.04, 97/59 HEENT: oral mucosa pink and  moist, NCAT Cardio: Reg rate Chest: normal effort, normal rate of breathing Abd: soft, non-distended Ext: no edema Skin: intact Neuro:Alert and oriented x3 Musculoskeletal: Severely limited range of motion in bilateral arms- only about 30 degrees in bilateral elevation.  Psych: pleasant, normal affect    Assessment & Plan:   1) Chronic bilateral adhesive capsulitis: Tylenol 639m morning, lunch, dinner Apply diclofenac gel up to 4 times per day (alternatively can try CBD oil or blue emu oil)  Lidocaine patch on both shoulders, reordered -prescribed daily 3871mprednisone -home therapy ordered Vitamin D level checked and was normal in 2020  2) Hypomagnesemia  -Magnesium level checked from 2019 and was low at 1.3: will order supplement to take at night  3) Family support: Has wonderful family support. Discussed how touched she is by her children's care.   4) Incontinent stool/constipation: Magnesium will also help to regulate this. Discussed goal of 1-2 BM per day.   5) Polymyalgia rheumatica -prescribed prednisone 71m54maily prn. Can take up to 5 per day.  -continue CBD oil -Discussed current symptoms of pain and history of pain.  -Discussed benefits of exercise in reducing pain. -Discussed following foods that may reduce pain: 1) Ginger (especially studied for arthritis)- reduce leukotriene production to decrease inflammation 2) Blueberries- high in phytonutrients that decrease inflammation 3) Salmon- marine omega-3s reduce joint swelling and pain 4) Pumpkin seeds- reduce inflammation 5) dark chocolate- reduces inflammation 6) turmeric- reduces inflammation 7) tart cherries - reduce  pain and stiffness 8) extra virgin olive oil - its compound olecanthal helps to block prostaglandins  9) chili peppers- can be eaten or applied topically via capsaicin 10) mint- helpful for headache, muscle aches, joint pain, and itching 11) garlic- reduces inflammation  Link to further  information on diet for chronic pain: http://www.randall.[email or web address]   Turmeric to reduce inflammation--can be used in cooking or taken as a supplement.  Benefits of turmeric:  -Highly anti-inflammatory  -Increases antioxidants  -Improves memory, attention, brain disease  -Lowers risk of heart disease  -May help prevent cancer  -Decreases pain  -Alleviates depression  -Delays aging and decreases risk of chronic disease  -Consume with black pepper to increase absorption    Turmeric Milk Recipe:  1 cup milk  1 tsp turmeric  1 tsp cinnamon  1 tsp grated ginger (optional)  Black pepper (boosts the anti-inflammatory properties of turmeric).  1 tsp honey   6) CKD stage 4: -recommending staying well hydrated.   7) Osteoporosis? -discussed benefits of weight bearing exercise -recommend yogurt and figs.   8) Tongue drying out/?sleep apnea/snoring -discussed decreasing inflammatory carbohydrates in the diet -avoid mouth wash

## 2022-12-09 DIAGNOSIS — G9341 Metabolic encephalopathy: Secondary | ICD-10-CM | POA: Diagnosis not present

## 2022-12-09 DIAGNOSIS — D631 Anemia in chronic kidney disease: Secondary | ICD-10-CM | POA: Diagnosis not present

## 2022-12-09 DIAGNOSIS — M159 Polyosteoarthritis, unspecified: Secondary | ICD-10-CM | POA: Diagnosis not present

## 2022-12-09 DIAGNOSIS — Z6829 Body mass index (BMI) 29.0-29.9, adult: Secondary | ICD-10-CM | POA: Diagnosis not present

## 2022-12-09 DIAGNOSIS — M419 Scoliosis, unspecified: Secondary | ICD-10-CM | POA: Diagnosis not present

## 2022-12-09 DIAGNOSIS — Z7952 Long term (current) use of systemic steroids: Secondary | ICD-10-CM | POA: Diagnosis not present

## 2022-12-09 DIAGNOSIS — E876 Hypokalemia: Secondary | ICD-10-CM | POA: Diagnosis not present

## 2022-12-09 DIAGNOSIS — Z9181 History of falling: Secondary | ICD-10-CM | POA: Diagnosis not present

## 2022-12-09 DIAGNOSIS — M48061 Spinal stenosis, lumbar region without neurogenic claudication: Secondary | ICD-10-CM | POA: Diagnosis not present

## 2022-12-09 DIAGNOSIS — Z7984 Long term (current) use of oral hypoglycemic drugs: Secondary | ICD-10-CM | POA: Diagnosis not present

## 2022-12-09 DIAGNOSIS — E871 Hypo-osmolality and hyponatremia: Secondary | ICD-10-CM | POA: Diagnosis not present

## 2022-12-09 DIAGNOSIS — E538 Deficiency of other specified B group vitamins: Secondary | ICD-10-CM | POA: Diagnosis not present

## 2022-12-09 DIAGNOSIS — Z96653 Presence of artificial knee joint, bilateral: Secondary | ICD-10-CM | POA: Diagnosis not present

## 2022-12-09 DIAGNOSIS — N184 Chronic kidney disease, stage 4 (severe): Secondary | ICD-10-CM | POA: Diagnosis not present

## 2022-12-09 DIAGNOSIS — Z86718 Personal history of other venous thrombosis and embolism: Secondary | ICD-10-CM | POA: Diagnosis not present

## 2022-12-09 DIAGNOSIS — Z853 Personal history of malignant neoplasm of breast: Secondary | ICD-10-CM | POA: Diagnosis not present

## 2022-12-09 DIAGNOSIS — J45909 Unspecified asthma, uncomplicated: Secondary | ICD-10-CM | POA: Diagnosis not present

## 2022-12-09 DIAGNOSIS — I5032 Chronic diastolic (congestive) heart failure: Secondary | ICD-10-CM | POA: Diagnosis not present

## 2022-12-09 DIAGNOSIS — I130 Hypertensive heart and chronic kidney disease with heart failure and stage 1 through stage 4 chronic kidney disease, or unspecified chronic kidney disease: Secondary | ICD-10-CM | POA: Diagnosis not present

## 2022-12-09 DIAGNOSIS — M069 Rheumatoid arthritis, unspecified: Secondary | ICD-10-CM | POA: Diagnosis not present

## 2022-12-09 DIAGNOSIS — Z7982 Long term (current) use of aspirin: Secondary | ICD-10-CM | POA: Diagnosis not present

## 2022-12-09 DIAGNOSIS — M109 Gout, unspecified: Secondary | ICD-10-CM | POA: Diagnosis not present

## 2022-12-09 DIAGNOSIS — M25551 Pain in right hip: Secondary | ICD-10-CM | POA: Diagnosis not present

## 2022-12-10 ENCOUNTER — Ambulatory Visit
Admission: RE | Admit: 2022-12-10 | Discharge: 2022-12-10 | Disposition: A | Discharge status: Home / Self Care | Payer: Medicare HMO | Source: Ambulatory Visit | Attending: Nephrology | Admitting: Nephrology

## 2022-12-10 ENCOUNTER — Telehealth: Payer: Self-pay | Admitting: *Deleted

## 2022-12-10 DIAGNOSIS — K5730 Diverticulosis of large intestine without perforation or abscess without bleeding: Secondary | ICD-10-CM | POA: Diagnosis not present

## 2022-12-10 DIAGNOSIS — N289 Disorder of kidney and ureter, unspecified: Secondary | ICD-10-CM

## 2022-12-10 DIAGNOSIS — K862 Cyst of pancreas: Secondary | ICD-10-CM | POA: Diagnosis not present

## 2022-12-10 MED ORDER — GADOPICLENOL 0.5 MMOL/ML IV SOLN
7.0000 mL | Freq: Once | INTRAVENOUS | Status: AC | PRN
  Administered 2022-12-10: 7 mL via INTRAVENOUS

## 2022-12-10 NOTE — Telephone Encounter (Signed)
PA started through covermymeds  Key;[id number] Case ID: [id number] Awaiting approval.

## 2022-12-11 NOTE — Telephone Encounter (Signed)
PA on Lidocaine 5% patches has been denied.

## 2022-12-14 DIAGNOSIS — N184 Chronic kidney disease, stage 4 (severe): Secondary | ICD-10-CM | POA: Diagnosis not present

## 2022-12-15 ENCOUNTER — Ambulatory Visit (INDEPENDENT_AMBULATORY_CARE_PROVIDER_SITE_OTHER): Payer: Medicare HMO | Admitting: Internal Medicine

## 2022-12-15 VITALS — BP 124/70 | HR 73 | Temp 98.3°F | Ht 61.0 in | Wt 155.0 lb

## 2022-12-15 DIAGNOSIS — N184 Chronic kidney disease, stage 4 (severe): Secondary | ICD-10-CM | POA: Diagnosis not present

## 2022-12-15 DIAGNOSIS — R682 Dry mouth, unspecified: Secondary | ICD-10-CM

## 2022-12-15 DIAGNOSIS — R7302 Impaired glucose tolerance (oral): Secondary | ICD-10-CM | POA: Diagnosis not present

## 2022-12-15 DIAGNOSIS — E559 Vitamin D deficiency, unspecified: Secondary | ICD-10-CM

## 2022-12-15 DIAGNOSIS — I10 Essential (primary) hypertension: Secondary | ICD-10-CM

## 2022-12-15 DIAGNOSIS — Z0001 Encounter for general adult medical examination with abnormal findings: Secondary | ICD-10-CM

## 2022-12-15 DIAGNOSIS — Z0000 Encounter for general adult medical examination without abnormal findings: Secondary | ICD-10-CM | POA: Diagnosis not present

## 2022-12-15 NOTE — Progress Notes (Signed)
Patient ID: [name], female   DOB: 09-21-1936, 87 y.o.   MRN: [id number]         Chief Complaint:: wellness exam and 61mofollow up (Shortness of breath, chest tightness a month ago that lasted for one minute, dry tongue at night)  , ckd4, htn, hyperglycemia, low vit d       HPI:  Jazzmen JLINDLEY LANZIis a 87y.o. female here for wellness exam; for shingrix at pharmacy, declines covid booster o/w up to date                        Also Pt denies chest pain, increased sob or doe, wheezing, orthopnea, PND, increased LE swelling, palpitations, dizziness or syncope, except for 1 minute of chest tightness about 1 mo ago with mild sob, nonexertional, has not recurred.  Also has dry mouth and tongue, as well as dry eyes taking drops per optho;  Pt denies polydipsia, polyuria, or new focal neuro s/s.    Pt denies fever, wt loss, night sweats, loss of appetite, or other constitutional symptoms     Wt Readings from Last 3 Encounters:  12/15/22 155 lb (70.3 kg)  12/07/22 158 lb (71.7 kg)  10/21/22 158 lb (71.7 kg)   BP Readings from Last 3 Encounters:  12/15/22 124/70  12/07/22 (!) 152/73  10/21/22 (!) 144/82   Immunization History  Administered Date(s) Administered   Fluad Quad(high Dose 65+) 11/27/2020, 06/27/2021, 06/30/2022   Influenza Split 08/07/2011, 10/13/2012   Influenza Whole 07/27/2008, 08/14/2009, 07/01/2010   Influenza, High Dose Seasonal PF 07/10/2016, 08/10/2017, 08/30/2018, 09/08/2019   Influenza,inj,Quad PF,6+ Mos 08/28/2013, 07/18/2014, 07/26/2015   PFIZER(Purple Top)SARS-COV-2 Vaccination 12/11/2019, 01/02/2020   Pfizer Covid-19 Vaccine Bivalent Booster 153yr& up 11/03/2021   Pneumococcal Conjugate-13 02/20/2014   Pneumococcal Polysaccharide-23 10/26/2005   Tdap 11/22/2017   Unspecified SARS-COV-2 Vaccination 08/03/2022  There are no preventive care reminders to display for this patient.    Past Medical History:  Diagnosis Date   Allergic rhinitis 10/30/2016    Anemia    Asthma    [place]   ER/PR+ Her2- Left IDC    Chronic renal insufficiency    Chronic rhinitis    Colon polyp    Diastolic dysfunction [id number] DJD (degenerative joint disease)    Dyspnea    Frozen shoulder    Full dentures    GERD (gastroesophageal reflux disease)    Hearing loss    Hypertension    Hyponatremia    Impaired glucose tolerance 07/18/2014   Memory loss    [place]   Poor circulation    Vertigo    Wears glasses    Past Surgical History:  Procedure Laterality Date   ABDOMINAL HYSTERECTOMY     BREAST LUMPECTOMY WITH NEEDLE LOCALIZATION AND AXILLARY SENTINEL LYMPH NODE BX Left 12/04/2013   Procedure: BREAST LUMPECTOMY WITH NEEDLE LOCALIZATION AND AXILLARY SENTINEL LYMPH NODE BX;  Surgeon: DaShann MedalMD;  [place] Service: General;  Laterality: Left;   CATARACT EXTRACTION  2009   rt   COLONOSCOPY     EYE SURGERY Bilateral    cataract surgery   KNEE ARTHROSCOPY     both   LUMBAR LAMINECTOMY/DECOMPRESSION MICRODISCECTOMY Left 12/23/2017   Procedure: Left Lumbar One-Two Laminectomy with microdiscectomy;  Surgeon: JoEustace MooreMD;  [place] Service: Neurosurgery;  Laterality: Left;  Left L1-2 Laminectomy with microdiscectomy   TONSILLECTOMY     TOTAL KNEE ARTHROPLASTY  2002   rt   TOTAL KNEE ARTHROPLASTY  2003   left   VESICOVAGINAL FISTULA CLOSURE W/ TAH  1980    reports that she has never smoked. She has never been exposed to tobacco smoke. She has never used smokeless tobacco. She reports that she does not drink alcohol and does not use drugs. family history includes Colon cancer in her mother; Healthy in her daughter; Heart attack (age of onset: 30) in her son; Lung cancer in her brother; Stomach cancer in her mother. Allergies  Allergen Reactions   Hydrocodone Itching   Lasix [Furosemide] Other (See Comments)    Dizziness.     Tizanidine Other (See Comments)    Dizzy and fall   Iron Hives and Other (See Comments)     bad constipation    Current Outpatient Medications on File Prior to Visit  Medication Sig Dispense Refill   albuterol (VENTOLIN HFA) 108 (90 Base) MCG/ACT inhaler Inhale 1 puff into the lungs every 6 (six) hours as needed for wheezing or shortness of breath.     allopurinol (ZYLOPRIM) 100 MG tablet Take 100 mg by mouth daily.     amLODipine (NORVASC) 5 MG tablet TAKE 1 TABLET BY MOUTH EVERY DAY (Patient taking differently: Take 5 mg by mouth every morning.) 90 tablet 3   aspirin EC 81 MG tablet Take 81 mg by mouth every morning. Swallow whole.     Cholecalciferol (VITAMIN D3) 20 MCG (800 UNIT) TABS Take 1 tablet by mouth daily.     diclofenac Sodium (VOLTAREN) 1 % GEL Inhale 1 puff into the lungs 2 (two) times daily.     diclofenac Sodium (VOLTAREN) 1 % GEL APPLY 2 GRAMS TO AFFECTED AREA 4 TIMES A DAY 100 g 5   Ensure (ENSURE) Take 1 Can by mouth 3 (three) times daily between meals. (Patient taking differently: Take 237 mLs by mouth See admin instructions. Drink one can (237 mls) by mouth once or twice daily) 237 mL 12   FARXIGA 10 MG TABS tablet Take 10 mg by mouth daily.     febuxostat (ULORIC) 40 MG tablet Take 40 mg by mouth daily.     FLUAD QUADRIVALENT 0.5 ML injection      fluticasone-salmeterol (WIXELA INHUB) 250-50 MCG/ACT AEPB Inhale 1 puff into the lungs in the morning and at bedtime. 3 each 3   furosemide (LASIX) 40 MG tablet Take 1 tablet (40 mg total) by mouth every Monday, Wednesday, and Friday.     gabapentin (NEURONTIN) 300 MG capsule TAKE 1 CAPSULE BY MOUTH THREE TIMES A DAY 90 capsule 5   Incontinence Supply Disposable (DEPEND UNDERWEAR SM/MED) MISC Use as directed four times per day 120 each 5   lidocaine (LIDODERM) 5 % Place 1 patch onto the skin daily. Remove & Discard patch within 12 hours or as directed by MD 30 patch 0   losartan (COZAAR) 100 MG tablet Take 100 mg by mouth  daily.     magnesium gluconate (MAGONATE) 500 MG tablet TAKE 1 TABLET BY MOUTH TWICE A DAY 180 tablet 1   meclizine (ANTIVERT) 12.5 MG tablet TAKE 1 TABLET BY MOUTH THREE TIMES A DAY AS NEEDED FOR DIZZINESS (Patient taking differently: Take 12.5 mg by mouth 3 (three) times daily as needed for dizziness.) 90 tablet 2   montelukast (SINGULAIR) 10 MG tablet TAKE 1 TABLET BY MOUTH EVERY DAY 90 tablet 2  Multiple Vitamin (MULTIVITAMIN WITH MINERALS) TABS tablet Take 1 tablet by mouth daily. Centrum Silver     pantoprazole (PROTONIX) 40 MG tablet TAKE 1 TABLET BY MOUTH EVERY DAY 90 tablet 3   potassium chloride (KLOR-CON) 10 MEQ tablet TAKE 1 TABLET BY MOUTH EVERY DAY WHEN TAKING FUROSEMIDE (Patient taking differently: Take 10 mEq by mouth every Monday, Wednesday, and Friday.) 90 tablet 3   predniSONE (DELTASONE) 1 MG tablet Take 1 tablet (1 mg total) by mouth daily with breakfast. 270 tablet 3   SPIKEVAX syringe      triamcinolone cream (KENALOG) 0.1 % APPLY TO AFFECTED AREA TWICE A DAY 30 g 1   No current facility-administered medications on file prior to visit.        ROS:  All others reviewed and negative.  Objective        PE:  BP 124/70 (BP Location: Right Arm, Patient Position: Sitting, Cuff Size: Large)   Pulse 73   Temp 98.3 F (36.8 C) (Oral)   Ht '5\' 1"'$  (1.549 m)   Wt 155 lb (70.3 kg)   SpO2 94%   BMI 29.29 kg/m                 Constitutional: Pt appears in NAD               HENT: Head: NCAT.                Right Ear: External ear normal.                 Left Ear: External ear normal.                Eyes: . Pupils are equal, round, and reactive to light. Conjunctivae and EOM are normal               Nose: without d/c or deformity               Neck: Neck supple. Gross normal ROM               Cardiovascular: Normal rate and regular rhythm.                 Pulmonary/Chest: Effort normal and breath sounds without rales or wheezing.                Abd:  Soft, NT, ND, + BS, no  organomegaly               Neurological: Pt is alert. At baseline orientation, motor grossly intact               Skin: Skin is warm. No rashes, no other new lesions, LE edema - none               Psychiatric: Pt behavior is normal without agitation   Micro: none  Cardiac tracings I have personally interpreted today:  none  Pertinent Radiological findings (summarize): none   Lab Results  Component Value Date   WBC 8.7 02/02/2022   HGB 12.3 02/02/2022   HCT 37.2 02/02/2022   PLT 234.0 02/02/2022   GLUCOSE 163 (H) 02/09/2022   CHOL 246 (H) 02/02/2022   TRIG 214.0 (H) 02/02/2022   HDL 85.20 02/02/2022   LDLDIRECT 137.0 02/02/2022   LDLCALC 111 (H) 01/24/2021   ALT 32 02/02/2022   AST 22 02/02/2022   NA 144 02/09/2022   K 4.0 02/09/2022   CL 108 02/09/2022   CREATININE 1.46 (H)  02/09/2022   BUN 40 (H) 02/09/2022   CO2 28 02/09/2022   TSH 1.46 02/02/2022   INR 1.0 07/27/2021   HGBA1C 5.7 (A) 09/29/2022   Assessment/Plan:  Anneli AIVREE BOYD is a 87 y.o. Black or African American [2] female with  has a past medical history of Allergic rhinitis (10/30/2016), Anemia, Asthma, Breast cancer of upper-outer quadrant of left female breast ([place]) (11/08/2013), Chronic renal insufficiency, Chronic rhinitis, Colon polyp, Diastolic dysfunction (A999333), DJD (degenerative joint disease), Dyspnea, Frozen shoulder, Full dentures, GERD (gastroesophageal reflux disease), Hearing loss, Hypertension, Hyponatremia, Impaired glucose tolerance (07/18/2014), Memory loss, Morbid obesity ([place]), Poor circulation, Vertigo, and Wears glasses.  Encounter for well adult exam with abnormal findings Age and sex appropriate education and counseling updated with regular exercise and diet Referrals for preventative services - none needed Immunizations addressed - declines covid booster, Please have your Shingrix (shingles) shots done at your local pharmacy Smoking counseling  - none needed Evidence for  depression or other mood disorder - none significant Most recent labs reviewed. I have personally reviewed and have noted: 1) the patient's medical and social history 2) The patient's current medications and supplements 3) The patient's height, weight, and BMI have been recorded in the chart   CKD (chronic kidney disease) stage 4, GFR 15-29 ml/min (HCC) Lab Results  Component Value Date   CREATININE 1.46 (H) 02/09/2022   Stable overall, cont to avoid nephrotoxins, f/u renal as planned  Essential hypertension BP Readings from Last 3 Encounters:  12/15/22 124/70  12/07/22 (!) 152/73  10/21/22 (!) 144/82   Stable, pt to continue medical treatment norvasc 5 mg qd, losartan 100 mg qd   Impaired glucose tolerance Lab Results  Component Value Date   HGBA1C 5.7 (A) 09/29/2022   Stable, pt to continue current medical treatment farxiga 10 mg qd    Vitamin D deficiency Last vitamin D Lab Results  Component Value Date   VD25OH 60.42 02/02/2022   Stable, cont oral replacement   Dry mouth With dry eyes most likely c.w sicca syndrome, declines rheum referral for now, for otc lozenges Followup: Return in about 6 months (around 06/15/2023).  Cathlean Cower, MD 12/19/2022 3:11 PM [facility]

## 2022-12-15 NOTE — Patient Instructions (Signed)
Please continue all other medications as before, and refills have been done if requested.  Please have the pharmacy call with any other refills you may need.  Please continue your efforts at being more active, low cholesterol diet, and weight control.  You are otherwise up to date with prevention measures today.  Please keep your appointments with your specialists as you may have planned  Since you had lab with Renal yesterday, we can hold on further labs today  Please make an Appointment to return in 6 months, or sooner if needed

## 2022-12-16 DIAGNOSIS — G9341 Metabolic encephalopathy: Secondary | ICD-10-CM | POA: Diagnosis not present

## 2022-12-16 DIAGNOSIS — D631 Anemia in chronic kidney disease: Secondary | ICD-10-CM | POA: Diagnosis not present

## 2022-12-16 DIAGNOSIS — Z7982 Long term (current) use of aspirin: Secondary | ICD-10-CM | POA: Diagnosis not present

## 2022-12-16 DIAGNOSIS — Z7952 Long term (current) use of systemic steroids: Secondary | ICD-10-CM | POA: Diagnosis not present

## 2022-12-16 DIAGNOSIS — M25551 Pain in right hip: Secondary | ICD-10-CM | POA: Diagnosis not present

## 2022-12-16 DIAGNOSIS — M109 Gout, unspecified: Secondary | ICD-10-CM | POA: Diagnosis not present

## 2022-12-16 DIAGNOSIS — Z96653 Presence of artificial knee joint, bilateral: Secondary | ICD-10-CM | POA: Diagnosis not present

## 2022-12-16 DIAGNOSIS — M159 Polyosteoarthritis, unspecified: Secondary | ICD-10-CM | POA: Diagnosis not present

## 2022-12-16 DIAGNOSIS — Z9181 History of falling: Secondary | ICD-10-CM | POA: Diagnosis not present

## 2022-12-16 DIAGNOSIS — Z86718 Personal history of other venous thrombosis and embolism: Secondary | ICD-10-CM | POA: Diagnosis not present

## 2022-12-16 DIAGNOSIS — E871 Hypo-osmolality and hyponatremia: Secondary | ICD-10-CM | POA: Diagnosis not present

## 2022-12-16 DIAGNOSIS — Z853 Personal history of malignant neoplasm of breast: Secondary | ICD-10-CM | POA: Diagnosis not present

## 2022-12-16 DIAGNOSIS — E876 Hypokalemia: Secondary | ICD-10-CM | POA: Diagnosis not present

## 2022-12-16 DIAGNOSIS — J45909 Unspecified asthma, uncomplicated: Secondary | ICD-10-CM | POA: Diagnosis not present

## 2022-12-16 DIAGNOSIS — M069 Rheumatoid arthritis, unspecified: Secondary | ICD-10-CM | POA: Diagnosis not present

## 2022-12-16 DIAGNOSIS — M48061 Spinal stenosis, lumbar region without neurogenic claudication: Secondary | ICD-10-CM | POA: Diagnosis not present

## 2022-12-16 DIAGNOSIS — Z7984 Long term (current) use of oral hypoglycemic drugs: Secondary | ICD-10-CM | POA: Diagnosis not present

## 2022-12-16 DIAGNOSIS — M419 Scoliosis, unspecified: Secondary | ICD-10-CM | POA: Diagnosis not present

## 2022-12-16 DIAGNOSIS — Z6829 Body mass index (BMI) 29.0-29.9, adult: Secondary | ICD-10-CM | POA: Diagnosis not present

## 2022-12-16 DIAGNOSIS — I130 Hypertensive heart and chronic kidney disease with heart failure and stage 1 through stage 4 chronic kidney disease, or unspecified chronic kidney disease: Secondary | ICD-10-CM | POA: Diagnosis not present

## 2022-12-16 DIAGNOSIS — E538 Deficiency of other specified B group vitamins: Secondary | ICD-10-CM | POA: Diagnosis not present

## 2022-12-16 DIAGNOSIS — N184 Chronic kidney disease, stage 4 (severe): Secondary | ICD-10-CM | POA: Diagnosis not present

## 2022-12-16 DIAGNOSIS — I5032 Chronic diastolic (congestive) heart failure: Secondary | ICD-10-CM | POA: Diagnosis not present

## 2022-12-19 ENCOUNTER — Encounter: Payer: Self-pay | Admitting: Internal Medicine

## 2022-12-19 DIAGNOSIS — R682 Dry mouth, unspecified: Secondary | ICD-10-CM | POA: Insufficient documentation

## 2022-12-19 NOTE — Assessment & Plan Note (Signed)
Lab Results  Component Value Date   HGBA1C 5.7 (A) 09/29/2022   Stable, pt to continue current medical treatment farxiga 10 mg qd

## 2022-12-19 NOTE — Assessment & Plan Note (Signed)
BP Readings from Last 3 Encounters:  12/15/22 124/70  12/07/22 (!) 152/73  10/21/22 (!) 144/82   Stable, pt to continue medical treatment norvasc 5 mg qd, losartan 100 mg qd

## 2022-12-19 NOTE — Assessment & Plan Note (Signed)
Lab Results  Component Value Date   CREATININE 1.46 (H) 02/09/2022   Stable overall, cont to avoid nephrotoxins, f/u renal as planned

## 2022-12-19 NOTE — Assessment & Plan Note (Signed)
Last vitamin D Lab Results  Component Value Date   VD25OH 60.42 02/02/2022   Stable, cont oral replacement

## 2022-12-19 NOTE — Assessment & Plan Note (Signed)
With dry eyes most likely c.w sicca syndrome, declines rheum referral for now, for otc lozenges

## 2022-12-19 NOTE — Assessment & Plan Note (Signed)
Age and sex appropriate education and counseling updated with regular exercise and diet Referrals for preventative services - none needed Immunizations addressed - declines covid booster, Please have your Shingrix (shingles) shots done at your local pharmacy Smoking counseling  - none needed Evidence for depression or other mood disorder - none significant Most recent labs reviewed. I have personally reviewed and have noted: 1) the patient's medical and social history 2) The patient's current medications and supplements 3) The patient's height, weight, and BMI have been recorded in the chart

## 2022-12-21 DIAGNOSIS — E871 Hypo-osmolality and hyponatremia: Secondary | ICD-10-CM | POA: Diagnosis not present

## 2022-12-21 DIAGNOSIS — N184 Chronic kidney disease, stage 4 (severe): Secondary | ICD-10-CM | POA: Diagnosis not present

## 2022-12-21 DIAGNOSIS — D631 Anemia in chronic kidney disease: Secondary | ICD-10-CM | POA: Diagnosis not present

## 2022-12-21 DIAGNOSIS — R7303 Prediabetes: Secondary | ICD-10-CM | POA: Diagnosis not present

## 2022-12-21 DIAGNOSIS — N281 Cyst of kidney, acquired: Secondary | ICD-10-CM | POA: Diagnosis not present

## 2022-12-21 DIAGNOSIS — I129 Hypertensive chronic kidney disease with stage 1 through stage 4 chronic kidney disease, or unspecified chronic kidney disease: Secondary | ICD-10-CM | POA: Diagnosis not present

## 2022-12-21 DIAGNOSIS — R600 Localized edema: Secondary | ICD-10-CM | POA: Diagnosis not present

## 2022-12-21 DIAGNOSIS — R809 Proteinuria, unspecified: Secondary | ICD-10-CM | POA: Diagnosis not present

## 2022-12-21 DIAGNOSIS — R3129 Other microscopic hematuria: Secondary | ICD-10-CM | POA: Diagnosis not present

## 2022-12-22 DIAGNOSIS — Z6829 Body mass index (BMI) 29.0-29.9, adult: Secondary | ICD-10-CM | POA: Diagnosis not present

## 2022-12-22 DIAGNOSIS — M419 Scoliosis, unspecified: Secondary | ICD-10-CM | POA: Diagnosis not present

## 2022-12-22 DIAGNOSIS — J45909 Unspecified asthma, uncomplicated: Secondary | ICD-10-CM | POA: Diagnosis not present

## 2022-12-22 DIAGNOSIS — Z86718 Personal history of other venous thrombosis and embolism: Secondary | ICD-10-CM | POA: Diagnosis not present

## 2022-12-22 DIAGNOSIS — E876 Hypokalemia: Secondary | ICD-10-CM | POA: Diagnosis not present

## 2022-12-22 DIAGNOSIS — Z853 Personal history of malignant neoplasm of breast: Secondary | ICD-10-CM | POA: Diagnosis not present

## 2022-12-22 DIAGNOSIS — Z7984 Long term (current) use of oral hypoglycemic drugs: Secondary | ICD-10-CM | POA: Diagnosis not present

## 2022-12-22 DIAGNOSIS — I130 Hypertensive heart and chronic kidney disease with heart failure and stage 1 through stage 4 chronic kidney disease, or unspecified chronic kidney disease: Secondary | ICD-10-CM | POA: Diagnosis not present

## 2022-12-22 DIAGNOSIS — Z96653 Presence of artificial knee joint, bilateral: Secondary | ICD-10-CM | POA: Diagnosis not present

## 2022-12-22 DIAGNOSIS — M48061 Spinal stenosis, lumbar region without neurogenic claudication: Secondary | ICD-10-CM | POA: Diagnosis not present

## 2022-12-22 DIAGNOSIS — N184 Chronic kidney disease, stage 4 (severe): Secondary | ICD-10-CM | POA: Diagnosis not present

## 2022-12-22 DIAGNOSIS — M109 Gout, unspecified: Secondary | ICD-10-CM | POA: Diagnosis not present

## 2022-12-22 DIAGNOSIS — Z7982 Long term (current) use of aspirin: Secondary | ICD-10-CM | POA: Diagnosis not present

## 2022-12-22 DIAGNOSIS — D631 Anemia in chronic kidney disease: Secondary | ICD-10-CM | POA: Diagnosis not present

## 2022-12-22 DIAGNOSIS — M25551 Pain in right hip: Secondary | ICD-10-CM | POA: Diagnosis not present

## 2022-12-22 DIAGNOSIS — I5032 Chronic diastolic (congestive) heart failure: Secondary | ICD-10-CM | POA: Diagnosis not present

## 2022-12-22 DIAGNOSIS — E538 Deficiency of other specified B group vitamins: Secondary | ICD-10-CM | POA: Diagnosis not present

## 2022-12-22 DIAGNOSIS — G9341 Metabolic encephalopathy: Secondary | ICD-10-CM | POA: Diagnosis not present

## 2022-12-22 DIAGNOSIS — E871 Hypo-osmolality and hyponatremia: Secondary | ICD-10-CM | POA: Diagnosis not present

## 2022-12-22 DIAGNOSIS — M159 Polyosteoarthritis, unspecified: Secondary | ICD-10-CM | POA: Diagnosis not present

## 2022-12-22 DIAGNOSIS — Z9181 History of falling: Secondary | ICD-10-CM | POA: Diagnosis not present

## 2022-12-22 DIAGNOSIS — M069 Rheumatoid arthritis, unspecified: Secondary | ICD-10-CM | POA: Diagnosis not present

## 2022-12-22 DIAGNOSIS — Z7952 Long term (current) use of systemic steroids: Secondary | ICD-10-CM | POA: Diagnosis not present

## 2022-12-23 DIAGNOSIS — E876 Hypokalemia: Secondary | ICD-10-CM | POA: Diagnosis not present

## 2022-12-23 DIAGNOSIS — E538 Deficiency of other specified B group vitamins: Secondary | ICD-10-CM | POA: Diagnosis not present

## 2022-12-23 DIAGNOSIS — J45909 Unspecified asthma, uncomplicated: Secondary | ICD-10-CM | POA: Diagnosis not present

## 2022-12-23 DIAGNOSIS — Z9181 History of falling: Secondary | ICD-10-CM | POA: Diagnosis not present

## 2022-12-23 DIAGNOSIS — M25551 Pain in right hip: Secondary | ICD-10-CM | POA: Diagnosis not present

## 2022-12-23 DIAGNOSIS — Z7952 Long term (current) use of systemic steroids: Secondary | ICD-10-CM | POA: Diagnosis not present

## 2022-12-23 DIAGNOSIS — Z853 Personal history of malignant neoplasm of breast: Secondary | ICD-10-CM | POA: Diagnosis not present

## 2022-12-23 DIAGNOSIS — M419 Scoliosis, unspecified: Secondary | ICD-10-CM | POA: Diagnosis not present

## 2022-12-23 DIAGNOSIS — Z6829 Body mass index (BMI) 29.0-29.9, adult: Secondary | ICD-10-CM | POA: Diagnosis not present

## 2022-12-23 DIAGNOSIS — M069 Rheumatoid arthritis, unspecified: Secondary | ICD-10-CM | POA: Diagnosis not present

## 2022-12-23 DIAGNOSIS — I130 Hypertensive heart and chronic kidney disease with heart failure and stage 1 through stage 4 chronic kidney disease, or unspecified chronic kidney disease: Secondary | ICD-10-CM | POA: Diagnosis not present

## 2022-12-23 DIAGNOSIS — D631 Anemia in chronic kidney disease: Secondary | ICD-10-CM | POA: Diagnosis not present

## 2022-12-23 DIAGNOSIS — M48061 Spinal stenosis, lumbar region without neurogenic claudication: Secondary | ICD-10-CM | POA: Diagnosis not present

## 2022-12-23 DIAGNOSIS — Z7984 Long term (current) use of oral hypoglycemic drugs: Secondary | ICD-10-CM | POA: Diagnosis not present

## 2022-12-23 DIAGNOSIS — M109 Gout, unspecified: Secondary | ICD-10-CM | POA: Diagnosis not present

## 2022-12-23 DIAGNOSIS — Z7982 Long term (current) use of aspirin: Secondary | ICD-10-CM | POA: Diagnosis not present

## 2022-12-23 DIAGNOSIS — N184 Chronic kidney disease, stage 4 (severe): Secondary | ICD-10-CM | POA: Diagnosis not present

## 2022-12-23 DIAGNOSIS — I5032 Chronic diastolic (congestive) heart failure: Secondary | ICD-10-CM | POA: Diagnosis not present

## 2022-12-23 DIAGNOSIS — E871 Hypo-osmolality and hyponatremia: Secondary | ICD-10-CM | POA: Diagnosis not present

## 2022-12-23 DIAGNOSIS — M159 Polyosteoarthritis, unspecified: Secondary | ICD-10-CM | POA: Diagnosis not present

## 2022-12-23 DIAGNOSIS — Z86718 Personal history of other venous thrombosis and embolism: Secondary | ICD-10-CM | POA: Diagnosis not present

## 2022-12-23 DIAGNOSIS — Z96653 Presence of artificial knee joint, bilateral: Secondary | ICD-10-CM | POA: Diagnosis not present

## 2022-12-23 DIAGNOSIS — G9341 Metabolic encephalopathy: Secondary | ICD-10-CM | POA: Diagnosis not present

## 2022-12-28 ENCOUNTER — Ambulatory Visit: Payer: Medicare HMO | Admitting: Podiatry

## 2022-12-28 ENCOUNTER — Encounter: Payer: Self-pay | Admitting: Podiatry

## 2022-12-28 DIAGNOSIS — M76829 Posterior tibial tendinitis, unspecified leg: Secondary | ICD-10-CM | POA: Diagnosis not present

## 2022-12-28 DIAGNOSIS — G5753 Tarsal tunnel syndrome, bilateral lower limbs: Secondary | ICD-10-CM | POA: Diagnosis not present

## 2022-12-28 MED ORDER — DEXAMETHASONE SODIUM PHOSPHATE 120 MG/30ML IJ SOLN
4.0000 mg | Freq: Once | INTRAMUSCULAR | Status: AC
  Administered 2022-12-28: 4 mg via INTRA_ARTICULAR

## 2022-12-28 NOTE — Progress Notes (Signed)
Chief Complaint  Patient presents with   Tendonitis    6 week follow up - both feet are still tender     HPI: Patient is 87 y.o. female who presents today for follow-up of foot pain. Relates the toes on both feet have been very painful and has had trouble wearing the brace.     Patient Active Problem List   Diagnosis Date Noted   Dry mouth 12/19/2022   Right leg pain 10/03/2022   Degenerative lumbar spinal stenosis 09/29/2022   Scoliosis of lumbar spine 09/29/2022   Snoring 07/16/2022   Daytime somnolence 04/30/2022   B12 deficiency 02/08/2022   Primary osteoarthritis of both shoulders 02/04/2022   Carcinoma in situ of left breast [id number]   Acute metabolic encephalopathy    Sepsis with encephalopathy without septic shock ([place])    AMS (altered mental status) 07/29/2021   Altered mental status 07/27/2021   Chronic diastolic CHF (congestive heart failure) ([place]) 07/27/2021   Right hip pain 06/27/2021   Right knee pain [id number]   Acute diastolic congestive heart failure ([place]) 06/18/2021   Calcium pyrophosphate deposition disease 02/12/2021   Fibromyalgia 02/12/2021   Polymyalgia rheumatica ([place]) 02/12/2021   Inflammatory arthritis 02/12/2021   Left knee pain 02/12/2021   Long term (current) use of systemic steroids 02/01/2021   Nail disorder 01/24/2021   Toe pain, right 01/24/2021   Chondrocalcinosis 12/19/2020   Primary osteoarthritis of both hands 12/19/2020   Pain in joint of left shoulder 08/22/2020   Pain in joint of right shoulder 08/22/2020   Shoulder pain 07/31/2020   Chronic pain 04/23/2020   Supraclavicular fossa fullness 04/23/2020   Finding of neck region 04/23/2020   Low grade fever 12/07/2019   OAB (overactive bladder) 12/07/2019   Pain of right heel 12/07/2019   Overactive bladder 12/07/2019   Diarrhea 04/13/2019   Abnormal gait 08/30/2018   Asthenia 08/30/2018   Conjunctivitis of left eye 04/13/2018   Rash 04/13/2018   Eruption at injection site  04/13/2018   Acute bilateral deep vein thrombosis (DVT) of femoral veins (HCC) 01/31/2018   Hypokalemia 01/31/2018   Electrocardiogram abnormal 01/31/2018   Increased creatine kinase level 01/31/2018   Left leg pain 01/13/2018   Left leg swelling 01/13/2018   Hearing loss 01/13/2018   Intractable pain 12/27/2017   Arachnoiditis 12/27/2017   Pain 12/27/2017   Hyponatremia    S/P lumbar laminectomy 12/23/2017   History of lumbar laminectomy 12/23/2017   Wheezing 12/10/2017   Itching 11/22/2017   Urinary frequency 11/22/2017   Increased frequency of urination 11/22/2017   Degeneration of lumbar intervertebral disc 11/05/2017   Sciatica 09/04/2017   Asthma exacerbation 08/31/2017   Acute asthma 08/31/2017   Flare of rheumatoid arthritis ([place]) 05/11/2017   Articular gout 05/01/2017   Vertigo 10/30/2016   Allergic rhinitis 10/30/2016   Incontinence of feces 07/31/2016   Hemorrhoids 07/31/2016   Hematochezia 07/31/2016   Peripheral venous insufficiency [id number]   Diastolic dysfunction [id number]   Peripheral edema 07/10/2016   Left ankle pain 06/02/2016   Neck pain 03/10/2016   Low back pain 03/10/2016   Right cervical radiculopathy 11/13/2015   Bradycardia 03/15/2015   Joint pain 12/18/2014   Fever 11/27/2014   Impaired glucose tolerance 07/18/2014   Right lumbar radiculopathy 07/18/2014   Left lumbar radiculopathy 07/18/2014   Encounter for well adult exam with abnormal findings 02/20/2014   Malignant neoplasm of upper-outer quadrant of female breast ([place]) 11/08/2013   Diverticular disease 07/09/2012   Headache(784.0) 06/30/2012  Anemia 06/29/2012   Dyspnea 03/11/2012   Cough 03/11/2012   CKD (chronic kidney disease) stage 4, GFR 15-29 ml/min (HCC) 05/18/2011   Chronic kidney disease, stage 4 (severe) ([place]) 05/18/2011   Gastroesophageal reflux disease 07/05/2008   Vitamin D deficiency 12/19/2007   Morbid obesity ([place]) 12/16/2007   Essential hypertension 12/16/2007    DEGENERATIVE JOINT DISEASE 12/16/2007   Osteoarthritis 12/16/2007    Current Outpatient Medications on File Prior to Visit  Medication Sig Dispense Refill   albuterol (VENTOLIN HFA) 108 (90 Base) MCG/ACT inhaler Inhale 1 puff into the lungs every 6 (six) hours as needed for wheezing or shortness of breath.     allopurinol (ZYLOPRIM) 100 MG tablet Take 100 mg by mouth daily.     amLODipine (NORVASC) 5 MG tablet TAKE 1 TABLET BY MOUTH EVERY DAY (Patient taking differently: Take 5 mg by mouth every morning.) 90 tablet 3   aspirin EC 81 MG tablet Take 81 mg by mouth every morning. Swallow whole.     Cholecalciferol (VITAMIN D3) 20 MCG (800 UNIT) TABS Take 1 tablet by mouth daily.     diclofenac Sodium (VOLTAREN) 1 % GEL Inhale 1 puff into the lungs 2 (two) times daily.     diclofenac Sodium (VOLTAREN) 1 % GEL APPLY 2 GRAMS TO AFFECTED AREA 4 TIMES A DAY 100 g 5   Ensure (ENSURE) Take 1 Can by mouth 3 (three) times daily between meals. (Patient taking differently: Take 237 mLs by mouth See admin instructions. Drink one can (237 mls) by mouth once or twice daily) 237 mL 12   FARXIGA 10 MG TABS tablet Take 10 mg by mouth daily.     febuxostat (ULORIC) 40 MG tablet Take 40 mg by mouth daily.     FLUAD QUADRIVALENT 0.5 ML injection      fluticasone-salmeterol (WIXELA INHUB) 250-50 MCG/ACT AEPB Inhale 1 puff into the lungs in the morning and at bedtime. 3 each 3   furosemide (LASIX) 40 MG tablet Take 1 tablet (40 mg total) by mouth every Monday, Wednesday, and Friday.     gabapentin (NEURONTIN) 300 MG capsule TAKE 1 CAPSULE BY MOUTH THREE TIMES A DAY 90 capsule 5   Incontinence Supply Disposable (DEPEND UNDERWEAR SM/MED) MISC Use as directed four times per day 120 each 5   lidocaine (LIDODERM) 5 % Place 1 patch onto the skin daily. Remove & Discard patch within 12 hours or as directed by MD 30 patch 0   losartan (COZAAR) 100 MG tablet Take 100 mg by mouth daily.     magnesium gluconate (MAGONATE) 500  MG tablet TAKE 1 TABLET BY MOUTH TWICE A DAY 180 tablet 1   meclizine (ANTIVERT) 12.5 MG tablet TAKE 1 TABLET BY MOUTH THREE TIMES A DAY AS NEEDED FOR DIZZINESS (Patient taking differently: Take 12.5 mg by mouth 3 (three) times daily as needed for dizziness.) 90 tablet 2   montelukast (SINGULAIR) 10 MG tablet TAKE 1 TABLET BY MOUTH EVERY DAY 90 tablet 2   Multiple Vitamin (MULTIVITAMIN WITH MINERALS) TABS tablet Take 1 tablet by mouth daily. Centrum Silver     pantoprazole (PROTONIX) 40 MG tablet TAKE 1 TABLET BY MOUTH EVERY DAY 90 tablet 3   potassium chloride (KLOR-CON) 10 MEQ tablet TAKE 1 TABLET BY MOUTH EVERY DAY WHEN TAKING FUROSEMIDE (Patient taking differently: Take 10 mEq by mouth every Monday, Wednesday, and Friday.) 90 tablet 3   predniSONE (DELTASONE) 1 MG tablet Take 1 tablet (1 mg total) by mouth daily  with breakfast. 270 tablet 3   SPIKEVAX syringe      triamcinolone cream (KENALOG) 0.1 % APPLY TO AFFECTED AREA TWICE A DAY 30 g 1   No current facility-administered medications on file prior to visit.    Allergies  Allergen Reactions   Hydrocodone Itching   Lasix [Furosemide] Other (See Comments)    Dizziness.    Tizanidine Other (See Comments)    Dizzy and fall   Iron Hives and Other (See Comments)     bad constipation     Review of Systems No fevers, chills, nausea, muscle aches, no difficulty breathing, no calf pain, no chest pain or shortness of breath.   Physical Exam  GENERAL APPEARANCE: Alert, conversant. Appropriately groomed. No acute distress.   VASCULAR: Pedal pulses palpable 1/4 DP and 0/4 PT bilateral.  Capillary refill time is immediate to all digits,  Proximal to distal cooling it warm to warm.  Digital perfusion adequate.   NEUROLOGIC: sensation is decreased to 5.07 monofilament at 2/5 sites bilateral.  Light touch is intact bilateral, vibratory sensation absent bilateral  MUSCULOSKELETAL: acceptable muscle strength, tone and stability bilateral.   planus foot type is noted Tender to PT tendon along course and to insertion. Pain with inversion and plantar flexion. Sever pes planus on left  DERMATOLOGIC: skin is warm, supple, and dry.  Color, texture, and turgor of skin within normal limits.  Digital nails are thick, discolored, dystrophic, brittle with subungual debris present and clinically mycotic x 10.     Tip of right great toe- non blanchable erythema, no calor, appears to have been a blister that has resolved and is now healing.  No redness, no swelling, no drainage.  No sign of infection noted.    Assessment     ICD-10-CM   1. PTTD (posterior tibial tendon dysfunction)  M76.829     2. Tarsal tunnel syndrome of both lower extremities  G57.53 dexamethasone (DECADRON) injection 4 mg         Plan  Discussed PTTD  and tarsal tunnel diagnosis and treatment options with patient. Continue bracing and stretching as tolerated.  Injection provided today. Procedure below.  Discussed if there is no improvement PT/MRImay be an option. Patient to return to clinic in 6 to 8 weeks or sooner if symptoms fail to improve or worsen.   Procedure: Injection Tendon/Ligament Discussed alternatives, risks, complications and verbal consent was obtained.  Location: Bilateral tarsal tunnel . Skin Prep: Alcohol. Injectate: 1cc 0.5% marcaine plain, 1 cc dexamethasone.  Disposition: Patient tolerated procedure well. Injection site dressed with a band-aid.  Post-injection care was discussed and return precautions discussed.

## 2022-12-29 DIAGNOSIS — Z853 Personal history of malignant neoplasm of breast: Secondary | ICD-10-CM | POA: Diagnosis not present

## 2022-12-29 DIAGNOSIS — J45909 Unspecified asthma, uncomplicated: Secondary | ICD-10-CM | POA: Diagnosis not present

## 2022-12-29 DIAGNOSIS — M069 Rheumatoid arthritis, unspecified: Secondary | ICD-10-CM | POA: Diagnosis not present

## 2022-12-29 DIAGNOSIS — M419 Scoliosis, unspecified: Secondary | ICD-10-CM | POA: Diagnosis not present

## 2022-12-29 DIAGNOSIS — Z86718 Personal history of other venous thrombosis and embolism: Secondary | ICD-10-CM | POA: Diagnosis not present

## 2022-12-29 DIAGNOSIS — E871 Hypo-osmolality and hyponatremia: Secondary | ICD-10-CM | POA: Diagnosis not present

## 2022-12-29 DIAGNOSIS — M109 Gout, unspecified: Secondary | ICD-10-CM | POA: Diagnosis not present

## 2022-12-29 DIAGNOSIS — M48061 Spinal stenosis, lumbar region without neurogenic claudication: Secondary | ICD-10-CM | POA: Diagnosis not present

## 2022-12-29 DIAGNOSIS — E876 Hypokalemia: Secondary | ICD-10-CM | POA: Diagnosis not present

## 2022-12-29 DIAGNOSIS — M25551 Pain in right hip: Secondary | ICD-10-CM | POA: Diagnosis not present

## 2022-12-29 DIAGNOSIS — G9341 Metabolic encephalopathy: Secondary | ICD-10-CM | POA: Diagnosis not present

## 2022-12-29 DIAGNOSIS — D631 Anemia in chronic kidney disease: Secondary | ICD-10-CM | POA: Diagnosis not present

## 2022-12-29 DIAGNOSIS — Z7982 Long term (current) use of aspirin: Secondary | ICD-10-CM | POA: Diagnosis not present

## 2022-12-29 DIAGNOSIS — Z96653 Presence of artificial knee joint, bilateral: Secondary | ICD-10-CM | POA: Diagnosis not present

## 2022-12-29 DIAGNOSIS — Z6829 Body mass index (BMI) 29.0-29.9, adult: Secondary | ICD-10-CM | POA: Diagnosis not present

## 2022-12-29 DIAGNOSIS — N184 Chronic kidney disease, stage 4 (severe): Secondary | ICD-10-CM | POA: Diagnosis not present

## 2022-12-29 DIAGNOSIS — Z9181 History of falling: Secondary | ICD-10-CM | POA: Diagnosis not present

## 2022-12-29 DIAGNOSIS — Z7952 Long term (current) use of systemic steroids: Secondary | ICD-10-CM | POA: Diagnosis not present

## 2022-12-29 DIAGNOSIS — I130 Hypertensive heart and chronic kidney disease with heart failure and stage 1 through stage 4 chronic kidney disease, or unspecified chronic kidney disease: Secondary | ICD-10-CM | POA: Diagnosis not present

## 2022-12-29 DIAGNOSIS — Z7984 Long term (current) use of oral hypoglycemic drugs: Secondary | ICD-10-CM | POA: Diagnosis not present

## 2022-12-29 DIAGNOSIS — E538 Deficiency of other specified B group vitamins: Secondary | ICD-10-CM | POA: Diagnosis not present

## 2022-12-29 DIAGNOSIS — M159 Polyosteoarthritis, unspecified: Secondary | ICD-10-CM | POA: Diagnosis not present

## 2022-12-29 DIAGNOSIS — I5032 Chronic diastolic (congestive) heart failure: Secondary | ICD-10-CM | POA: Diagnosis not present

## 2022-12-30 DIAGNOSIS — I5032 Chronic diastolic (congestive) heart failure: Secondary | ICD-10-CM | POA: Diagnosis not present

## 2022-12-30 DIAGNOSIS — M109 Gout, unspecified: Secondary | ICD-10-CM | POA: Diagnosis not present

## 2022-12-30 DIAGNOSIS — I130 Hypertensive heart and chronic kidney disease with heart failure and stage 1 through stage 4 chronic kidney disease, or unspecified chronic kidney disease: Secondary | ICD-10-CM | POA: Diagnosis not present

## 2022-12-30 DIAGNOSIS — G9341 Metabolic encephalopathy: Secondary | ICD-10-CM | POA: Diagnosis not present

## 2022-12-30 DIAGNOSIS — M159 Polyosteoarthritis, unspecified: Secondary | ICD-10-CM | POA: Diagnosis not present

## 2022-12-30 DIAGNOSIS — M48061 Spinal stenosis, lumbar region without neurogenic claudication: Secondary | ICD-10-CM | POA: Diagnosis not present

## 2022-12-30 DIAGNOSIS — Z9181 History of falling: Secondary | ICD-10-CM | POA: Diagnosis not present

## 2022-12-30 DIAGNOSIS — Z6829 Body mass index (BMI) 29.0-29.9, adult: Secondary | ICD-10-CM | POA: Diagnosis not present

## 2022-12-30 DIAGNOSIS — E538 Deficiency of other specified B group vitamins: Secondary | ICD-10-CM | POA: Diagnosis not present

## 2022-12-30 DIAGNOSIS — M069 Rheumatoid arthritis, unspecified: Secondary | ICD-10-CM | POA: Diagnosis not present

## 2022-12-30 DIAGNOSIS — Z7952 Long term (current) use of systemic steroids: Secondary | ICD-10-CM | POA: Diagnosis not present

## 2022-12-30 DIAGNOSIS — N184 Chronic kidney disease, stage 4 (severe): Secondary | ICD-10-CM | POA: Diagnosis not present

## 2022-12-30 DIAGNOSIS — M419 Scoliosis, unspecified: Secondary | ICD-10-CM | POA: Diagnosis not present

## 2022-12-30 DIAGNOSIS — Z853 Personal history of malignant neoplasm of breast: Secondary | ICD-10-CM | POA: Diagnosis not present

## 2022-12-30 DIAGNOSIS — M25551 Pain in right hip: Secondary | ICD-10-CM | POA: Diagnosis not present

## 2022-12-30 DIAGNOSIS — E871 Hypo-osmolality and hyponatremia: Secondary | ICD-10-CM | POA: Diagnosis not present

## 2022-12-30 DIAGNOSIS — Z7984 Long term (current) use of oral hypoglycemic drugs: Secondary | ICD-10-CM | POA: Diagnosis not present

## 2022-12-30 DIAGNOSIS — Z7982 Long term (current) use of aspirin: Secondary | ICD-10-CM | POA: Diagnosis not present

## 2022-12-30 DIAGNOSIS — E876 Hypokalemia: Secondary | ICD-10-CM | POA: Diagnosis not present

## 2022-12-30 DIAGNOSIS — Z86718 Personal history of other venous thrombosis and embolism: Secondary | ICD-10-CM | POA: Diagnosis not present

## 2022-12-30 DIAGNOSIS — Z96653 Presence of artificial knee joint, bilateral: Secondary | ICD-10-CM | POA: Diagnosis not present

## 2022-12-30 DIAGNOSIS — J45909 Unspecified asthma, uncomplicated: Secondary | ICD-10-CM | POA: Diagnosis not present

## 2022-12-30 DIAGNOSIS — D631 Anemia in chronic kidney disease: Secondary | ICD-10-CM | POA: Diagnosis not present

## 2022-12-31 DIAGNOSIS — M159 Polyosteoarthritis, unspecified: Secondary | ICD-10-CM | POA: Diagnosis not present

## 2022-12-31 DIAGNOSIS — N184 Chronic kidney disease, stage 4 (severe): Secondary | ICD-10-CM | POA: Diagnosis not present

## 2022-12-31 DIAGNOSIS — M069 Rheumatoid arthritis, unspecified: Secondary | ICD-10-CM | POA: Diagnosis not present

## 2022-12-31 DIAGNOSIS — Z7984 Long term (current) use of oral hypoglycemic drugs: Secondary | ICD-10-CM | POA: Diagnosis not present

## 2022-12-31 DIAGNOSIS — E871 Hypo-osmolality and hyponatremia: Secondary | ICD-10-CM | POA: Diagnosis not present

## 2022-12-31 DIAGNOSIS — M25551 Pain in right hip: Secondary | ICD-10-CM | POA: Diagnosis not present

## 2022-12-31 DIAGNOSIS — Z96653 Presence of artificial knee joint, bilateral: Secondary | ICD-10-CM | POA: Diagnosis not present

## 2022-12-31 DIAGNOSIS — E876 Hypokalemia: Secondary | ICD-10-CM | POA: Diagnosis not present

## 2022-12-31 DIAGNOSIS — Z7952 Long term (current) use of systemic steroids: Secondary | ICD-10-CM | POA: Diagnosis not present

## 2022-12-31 DIAGNOSIS — Z853 Personal history of malignant neoplasm of breast: Secondary | ICD-10-CM | POA: Diagnosis not present

## 2022-12-31 DIAGNOSIS — Z9181 History of falling: Secondary | ICD-10-CM | POA: Diagnosis not present

## 2022-12-31 DIAGNOSIS — M109 Gout, unspecified: Secondary | ICD-10-CM | POA: Diagnosis not present

## 2022-12-31 DIAGNOSIS — Z6829 Body mass index (BMI) 29.0-29.9, adult: Secondary | ICD-10-CM | POA: Diagnosis not present

## 2022-12-31 DIAGNOSIS — J45909 Unspecified asthma, uncomplicated: Secondary | ICD-10-CM | POA: Diagnosis not present

## 2022-12-31 DIAGNOSIS — I5032 Chronic diastolic (congestive) heart failure: Secondary | ICD-10-CM | POA: Diagnosis not present

## 2022-12-31 DIAGNOSIS — I130 Hypertensive heart and chronic kidney disease with heart failure and stage 1 through stage 4 chronic kidney disease, or unspecified chronic kidney disease: Secondary | ICD-10-CM | POA: Diagnosis not present

## 2022-12-31 DIAGNOSIS — D631 Anemia in chronic kidney disease: Secondary | ICD-10-CM | POA: Diagnosis not present

## 2022-12-31 DIAGNOSIS — E538 Deficiency of other specified B group vitamins: Secondary | ICD-10-CM | POA: Diagnosis not present

## 2022-12-31 DIAGNOSIS — G9341 Metabolic encephalopathy: Secondary | ICD-10-CM | POA: Diagnosis not present

## 2022-12-31 DIAGNOSIS — M48061 Spinal stenosis, lumbar region without neurogenic claudication: Secondary | ICD-10-CM | POA: Diagnosis not present

## 2022-12-31 DIAGNOSIS — Z7982 Long term (current) use of aspirin: Secondary | ICD-10-CM | POA: Diagnosis not present

## 2022-12-31 DIAGNOSIS — M419 Scoliosis, unspecified: Secondary | ICD-10-CM | POA: Diagnosis not present

## 2022-12-31 DIAGNOSIS — Z86718 Personal history of other venous thrombosis and embolism: Secondary | ICD-10-CM | POA: Diagnosis not present

## 2023-01-08 DIAGNOSIS — Z7982 Long term (current) use of aspirin: Secondary | ICD-10-CM | POA: Diagnosis not present

## 2023-01-08 DIAGNOSIS — M419 Scoliosis, unspecified: Secondary | ICD-10-CM | POA: Diagnosis not present

## 2023-01-08 DIAGNOSIS — M159 Polyosteoarthritis, unspecified: Secondary | ICD-10-CM | POA: Diagnosis not present

## 2023-01-08 DIAGNOSIS — M48061 Spinal stenosis, lumbar region without neurogenic claudication: Secondary | ICD-10-CM | POA: Diagnosis not present

## 2023-01-08 DIAGNOSIS — E871 Hypo-osmolality and hyponatremia: Secondary | ICD-10-CM | POA: Diagnosis not present

## 2023-01-08 DIAGNOSIS — Z9181 History of falling: Secondary | ICD-10-CM | POA: Diagnosis not present

## 2023-01-08 DIAGNOSIS — M109 Gout, unspecified: Secondary | ICD-10-CM | POA: Diagnosis not present

## 2023-01-08 DIAGNOSIS — M069 Rheumatoid arthritis, unspecified: Secondary | ICD-10-CM | POA: Diagnosis not present

## 2023-01-08 DIAGNOSIS — E538 Deficiency of other specified B group vitamins: Secondary | ICD-10-CM | POA: Diagnosis not present

## 2023-01-08 DIAGNOSIS — Z7984 Long term (current) use of oral hypoglycemic drugs: Secondary | ICD-10-CM | POA: Diagnosis not present

## 2023-01-08 DIAGNOSIS — Z7952 Long term (current) use of systemic steroids: Secondary | ICD-10-CM | POA: Diagnosis not present

## 2023-01-08 DIAGNOSIS — Z6829 Body mass index (BMI) 29.0-29.9, adult: Secondary | ICD-10-CM | POA: Diagnosis not present

## 2023-01-08 DIAGNOSIS — I5032 Chronic diastolic (congestive) heart failure: Secondary | ICD-10-CM | POA: Diagnosis not present

## 2023-01-08 DIAGNOSIS — D631 Anemia in chronic kidney disease: Secondary | ICD-10-CM | POA: Diagnosis not present

## 2023-01-08 DIAGNOSIS — E876 Hypokalemia: Secondary | ICD-10-CM | POA: Diagnosis not present

## 2023-01-08 DIAGNOSIS — N184 Chronic kidney disease, stage 4 (severe): Secondary | ICD-10-CM | POA: Diagnosis not present

## 2023-01-08 DIAGNOSIS — Z853 Personal history of malignant neoplasm of breast: Secondary | ICD-10-CM | POA: Diagnosis not present

## 2023-01-08 DIAGNOSIS — J45909 Unspecified asthma, uncomplicated: Secondary | ICD-10-CM | POA: Diagnosis not present

## 2023-01-08 DIAGNOSIS — M25551 Pain in right hip: Secondary | ICD-10-CM | POA: Diagnosis not present

## 2023-01-08 DIAGNOSIS — I130 Hypertensive heart and chronic kidney disease with heart failure and stage 1 through stage 4 chronic kidney disease, or unspecified chronic kidney disease: Secondary | ICD-10-CM | POA: Diagnosis not present

## 2023-01-08 DIAGNOSIS — G9341 Metabolic encephalopathy: Secondary | ICD-10-CM | POA: Diagnosis not present

## 2023-01-08 DIAGNOSIS — Z86718 Personal history of other venous thrombosis and embolism: Secondary | ICD-10-CM | POA: Diagnosis not present

## 2023-01-08 DIAGNOSIS — Z96653 Presence of artificial knee joint, bilateral: Secondary | ICD-10-CM | POA: Diagnosis not present

## 2023-01-12 DIAGNOSIS — Z7984 Long term (current) use of oral hypoglycemic drugs: Secondary | ICD-10-CM | POA: Diagnosis not present

## 2023-01-12 DIAGNOSIS — E871 Hypo-osmolality and hyponatremia: Secondary | ICD-10-CM | POA: Diagnosis not present

## 2023-01-12 DIAGNOSIS — Z96653 Presence of artificial knee joint, bilateral: Secondary | ICD-10-CM | POA: Diagnosis not present

## 2023-01-12 DIAGNOSIS — E538 Deficiency of other specified B group vitamins: Secondary | ICD-10-CM | POA: Diagnosis not present

## 2023-01-12 DIAGNOSIS — Z9181 History of falling: Secondary | ICD-10-CM | POA: Diagnosis not present

## 2023-01-12 DIAGNOSIS — N184 Chronic kidney disease, stage 4 (severe): Secondary | ICD-10-CM | POA: Diagnosis not present

## 2023-01-12 DIAGNOSIS — G9341 Metabolic encephalopathy: Secondary | ICD-10-CM | POA: Diagnosis not present

## 2023-01-12 DIAGNOSIS — M109 Gout, unspecified: Secondary | ICD-10-CM | POA: Diagnosis not present

## 2023-01-12 DIAGNOSIS — J45909 Unspecified asthma, uncomplicated: Secondary | ICD-10-CM | POA: Diagnosis not present

## 2023-01-12 DIAGNOSIS — I5032 Chronic diastolic (congestive) heart failure: Secondary | ICD-10-CM | POA: Diagnosis not present

## 2023-01-12 DIAGNOSIS — M159 Polyosteoarthritis, unspecified: Secondary | ICD-10-CM | POA: Diagnosis not present

## 2023-01-12 DIAGNOSIS — Z6829 Body mass index (BMI) 29.0-29.9, adult: Secondary | ICD-10-CM | POA: Diagnosis not present

## 2023-01-12 DIAGNOSIS — D631 Anemia in chronic kidney disease: Secondary | ICD-10-CM | POA: Diagnosis not present

## 2023-01-12 DIAGNOSIS — M48061 Spinal stenosis, lumbar region without neurogenic claudication: Secondary | ICD-10-CM | POA: Diagnosis not present

## 2023-01-12 DIAGNOSIS — M25551 Pain in right hip: Secondary | ICD-10-CM | POA: Diagnosis not present

## 2023-01-12 DIAGNOSIS — Z7982 Long term (current) use of aspirin: Secondary | ICD-10-CM | POA: Diagnosis not present

## 2023-01-12 DIAGNOSIS — Z7952 Long term (current) use of systemic steroids: Secondary | ICD-10-CM | POA: Diagnosis not present

## 2023-01-12 DIAGNOSIS — E876 Hypokalemia: Secondary | ICD-10-CM | POA: Diagnosis not present

## 2023-01-12 DIAGNOSIS — I130 Hypertensive heart and chronic kidney disease with heart failure and stage 1 through stage 4 chronic kidney disease, or unspecified chronic kidney disease: Secondary | ICD-10-CM | POA: Diagnosis not present

## 2023-01-12 DIAGNOSIS — Z86718 Personal history of other venous thrombosis and embolism: Secondary | ICD-10-CM | POA: Diagnosis not present

## 2023-01-12 DIAGNOSIS — M069 Rheumatoid arthritis, unspecified: Secondary | ICD-10-CM | POA: Diagnosis not present

## 2023-01-12 DIAGNOSIS — Z853 Personal history of malignant neoplasm of breast: Secondary | ICD-10-CM | POA: Diagnosis not present

## 2023-01-12 DIAGNOSIS — M419 Scoliosis, unspecified: Secondary | ICD-10-CM | POA: Diagnosis not present

## 2023-01-20 DIAGNOSIS — M069 Rheumatoid arthritis, unspecified: Secondary | ICD-10-CM | POA: Diagnosis not present

## 2023-01-20 DIAGNOSIS — M109 Gout, unspecified: Secondary | ICD-10-CM | POA: Diagnosis not present

## 2023-01-20 DIAGNOSIS — E876 Hypokalemia: Secondary | ICD-10-CM | POA: Diagnosis not present

## 2023-01-20 DIAGNOSIS — M48061 Spinal stenosis, lumbar region without neurogenic claudication: Secondary | ICD-10-CM | POA: Diagnosis not present

## 2023-01-20 DIAGNOSIS — Z86718 Personal history of other venous thrombosis and embolism: Secondary | ICD-10-CM | POA: Diagnosis not present

## 2023-01-20 DIAGNOSIS — E538 Deficiency of other specified B group vitamins: Secondary | ICD-10-CM | POA: Diagnosis not present

## 2023-01-20 DIAGNOSIS — Z853 Personal history of malignant neoplasm of breast: Secondary | ICD-10-CM | POA: Diagnosis not present

## 2023-01-20 DIAGNOSIS — J45909 Unspecified asthma, uncomplicated: Secondary | ICD-10-CM | POA: Diagnosis not present

## 2023-01-20 DIAGNOSIS — E871 Hypo-osmolality and hyponatremia: Secondary | ICD-10-CM | POA: Diagnosis not present

## 2023-01-20 DIAGNOSIS — D631 Anemia in chronic kidney disease: Secondary | ICD-10-CM | POA: Diagnosis not present

## 2023-01-20 DIAGNOSIS — G9341 Metabolic encephalopathy: Secondary | ICD-10-CM | POA: Diagnosis not present

## 2023-01-20 DIAGNOSIS — Z9181 History of falling: Secondary | ICD-10-CM | POA: Diagnosis not present

## 2023-01-20 DIAGNOSIS — M419 Scoliosis, unspecified: Secondary | ICD-10-CM | POA: Diagnosis not present

## 2023-01-20 DIAGNOSIS — I130 Hypertensive heart and chronic kidney disease with heart failure and stage 1 through stage 4 chronic kidney disease, or unspecified chronic kidney disease: Secondary | ICD-10-CM | POA: Diagnosis not present

## 2023-01-20 DIAGNOSIS — N184 Chronic kidney disease, stage 4 (severe): Secondary | ICD-10-CM | POA: Diagnosis not present

## 2023-01-20 DIAGNOSIS — Z6829 Body mass index (BMI) 29.0-29.9, adult: Secondary | ICD-10-CM | POA: Diagnosis not present

## 2023-01-20 DIAGNOSIS — Z7952 Long term (current) use of systemic steroids: Secondary | ICD-10-CM | POA: Diagnosis not present

## 2023-01-20 DIAGNOSIS — Z7984 Long term (current) use of oral hypoglycemic drugs: Secondary | ICD-10-CM | POA: Diagnosis not present

## 2023-01-20 DIAGNOSIS — I5032 Chronic diastolic (congestive) heart failure: Secondary | ICD-10-CM | POA: Diagnosis not present

## 2023-01-20 DIAGNOSIS — M159 Polyosteoarthritis, unspecified: Secondary | ICD-10-CM | POA: Diagnosis not present

## 2023-01-20 DIAGNOSIS — M25551 Pain in right hip: Secondary | ICD-10-CM | POA: Diagnosis not present

## 2023-01-20 DIAGNOSIS — Z7982 Long term (current) use of aspirin: Secondary | ICD-10-CM | POA: Diagnosis not present

## 2023-01-20 DIAGNOSIS — Z96653 Presence of artificial knee joint, bilateral: Secondary | ICD-10-CM | POA: Diagnosis not present

## 2023-01-26 DIAGNOSIS — I5032 Chronic diastolic (congestive) heart failure: Secondary | ICD-10-CM | POA: Diagnosis not present

## 2023-01-26 DIAGNOSIS — Z7982 Long term (current) use of aspirin: Secondary | ICD-10-CM | POA: Diagnosis not present

## 2023-01-26 DIAGNOSIS — Z6829 Body mass index (BMI) 29.0-29.9, adult: Secondary | ICD-10-CM | POA: Diagnosis not present

## 2023-01-26 DIAGNOSIS — Z7952 Long term (current) use of systemic steroids: Secondary | ICD-10-CM | POA: Diagnosis not present

## 2023-01-26 DIAGNOSIS — Z7984 Long term (current) use of oral hypoglycemic drugs: Secondary | ICD-10-CM | POA: Diagnosis not present

## 2023-01-26 DIAGNOSIS — M48061 Spinal stenosis, lumbar region without neurogenic claudication: Secondary | ICD-10-CM | POA: Diagnosis not present

## 2023-01-26 DIAGNOSIS — M069 Rheumatoid arthritis, unspecified: Secondary | ICD-10-CM | POA: Diagnosis not present

## 2023-01-26 DIAGNOSIS — M109 Gout, unspecified: Secondary | ICD-10-CM | POA: Diagnosis not present

## 2023-01-26 DIAGNOSIS — J45909 Unspecified asthma, uncomplicated: Secondary | ICD-10-CM | POA: Diagnosis not present

## 2023-01-26 DIAGNOSIS — M25551 Pain in right hip: Secondary | ICD-10-CM | POA: Diagnosis not present

## 2023-01-26 DIAGNOSIS — Z9181 History of falling: Secondary | ICD-10-CM | POA: Diagnosis not present

## 2023-01-26 DIAGNOSIS — E871 Hypo-osmolality and hyponatremia: Secondary | ICD-10-CM | POA: Diagnosis not present

## 2023-01-26 DIAGNOSIS — Z96653 Presence of artificial knee joint, bilateral: Secondary | ICD-10-CM | POA: Diagnosis not present

## 2023-01-26 DIAGNOSIS — I130 Hypertensive heart and chronic kidney disease with heart failure and stage 1 through stage 4 chronic kidney disease, or unspecified chronic kidney disease: Secondary | ICD-10-CM | POA: Diagnosis not present

## 2023-01-26 DIAGNOSIS — D631 Anemia in chronic kidney disease: Secondary | ICD-10-CM | POA: Diagnosis not present

## 2023-01-26 DIAGNOSIS — E876 Hypokalemia: Secondary | ICD-10-CM | POA: Diagnosis not present

## 2023-01-26 DIAGNOSIS — M419 Scoliosis, unspecified: Secondary | ICD-10-CM | POA: Diagnosis not present

## 2023-01-26 DIAGNOSIS — M159 Polyosteoarthritis, unspecified: Secondary | ICD-10-CM | POA: Diagnosis not present

## 2023-01-26 DIAGNOSIS — N184 Chronic kidney disease, stage 4 (severe): Secondary | ICD-10-CM | POA: Diagnosis not present

## 2023-01-26 DIAGNOSIS — E538 Deficiency of other specified B group vitamins: Secondary | ICD-10-CM | POA: Diagnosis not present

## 2023-01-26 DIAGNOSIS — G9341 Metabolic encephalopathy: Secondary | ICD-10-CM | POA: Diagnosis not present

## 2023-01-26 DIAGNOSIS — Z86718 Personal history of other venous thrombosis and embolism: Secondary | ICD-10-CM | POA: Diagnosis not present

## 2023-01-26 DIAGNOSIS — Z853 Personal history of malignant neoplasm of breast: Secondary | ICD-10-CM | POA: Diagnosis not present

## 2023-01-27 DIAGNOSIS — Z1231 Encounter for screening mammogram for malignant neoplasm of breast: Secondary | ICD-10-CM | POA: Diagnosis not present

## 2023-02-01 DIAGNOSIS — M47816 Spondylosis without myelopathy or radiculopathy, lumbar region: Secondary | ICD-10-CM | POA: Diagnosis not present

## 2023-02-01 DIAGNOSIS — M19012 Primary osteoarthritis, left shoulder: Secondary | ICD-10-CM | POA: Diagnosis not present

## 2023-02-01 DIAGNOSIS — I10 Essential (primary) hypertension: Secondary | ICD-10-CM | POA: Diagnosis not present

## 2023-02-01 DIAGNOSIS — M549 Dorsalgia, unspecified: Secondary | ICD-10-CM | POA: Diagnosis not present

## 2023-02-01 DIAGNOSIS — M109 Gout, unspecified: Secondary | ICD-10-CM | POA: Diagnosis not present

## 2023-02-01 DIAGNOSIS — R921 Mammographic calcification found on diagnostic imaging of breast: Secondary | ICD-10-CM | POA: Diagnosis not present

## 2023-02-01 DIAGNOSIS — M19011 Primary osteoarthritis, right shoulder: Secondary | ICD-10-CM | POA: Diagnosis not present

## 2023-02-01 DIAGNOSIS — Z0130 Encounter for examination of blood pressure without abnormal findings: Secondary | ICD-10-CM | POA: Diagnosis not present

## 2023-02-01 DIAGNOSIS — R5383 Other fatigue: Secondary | ICD-10-CM | POA: Diagnosis not present

## 2023-02-01 DIAGNOSIS — E7800 Pure hypercholesterolemia, unspecified: Secondary | ICD-10-CM | POA: Diagnosis not present

## 2023-02-01 DIAGNOSIS — Z6830 Body mass index (BMI) 30.0-30.9, adult: Secondary | ICD-10-CM | POA: Diagnosis not present

## 2023-02-01 DIAGNOSIS — R922 Inconclusive mammogram: Secondary | ICD-10-CM | POA: Diagnosis not present

## 2023-02-01 DIAGNOSIS — G8929 Other chronic pain: Secondary | ICD-10-CM | POA: Diagnosis not present

## 2023-02-01 DIAGNOSIS — R928 Other abnormal and inconclusive findings on diagnostic imaging of breast: Secondary | ICD-10-CM | POA: Diagnosis not present

## 2023-02-01 DIAGNOSIS — R7303 Prediabetes: Secondary | ICD-10-CM | POA: Diagnosis not present

## 2023-02-01 LAB — HM MAMMOGRAPHY

## 2023-02-05 ENCOUNTER — Telehealth: Payer: Self-pay | Admitting: Podiatry

## 2023-02-05 NOTE — Telephone Encounter (Signed)
Pt called and has a wound on the left foot and toes on both feet having discomfort.  I offered appt on 4.15 at 115 and pt states she could not do that . She states she needed a late afternoon. I offered [place] office next week and she said no. I offered 4.25 at 315 and they could do not that. I also offered her one on 4.29  at 415.The person who is her bringing her was in the background stating they could only come after 400. She then said she would call us back.

## 2023-02-10 ENCOUNTER — Ambulatory Visit: Payer: Medicare HMO | Admitting: Podiatry

## 2023-02-10 DIAGNOSIS — L84 Corns and callosities: Secondary | ICD-10-CM | POA: Diagnosis not present

## 2023-02-10 NOTE — Progress Notes (Signed)
Subjective:  Patient ID: [name], female    DOB: Jun 20, 1936,  MRN: [id number]  Chief Complaint  Patient presents with   Toe Pain    87 y.o. female presents with the above complaint.  Patient presents with left fourth fifth digit heloma molle with interdigital space pain and callus.  Patient states this came out of nowhere is progressive gotten worse.  She had may have noticed a little wound there.  She states it feels a little bit better but wanted to get it evaluated.  She wears regular shoes.  She would like to discuss treatment options for this.  She is not a diabetic   Review of Systems: Negative except as noted in the HPI. Denies N/V/F/Ch.  Past Medical History:  Diagnosis Date   Allergic rhinitis 10/30/2016   Anemia    Asthma    Breast cancer of upper-outer quadrant of left female breast (HCC) 11/08/2013   ER/PR+ Her2- Left IDC    Chronic renal insufficiency    Chronic rhinitis    Colon polyp    Diastolic dysfunction 07/10/2016   DJD (degenerative joint disease)    Dyspnea    Frozen shoulder    Full dentures    GERD (gastroesophageal reflux disease)    Hearing loss    Hypertension    Hyponatremia    Impaired glucose tolerance 07/18/2014   Memory loss    Morbid obesity (HCC)    Poor circulation    Vertigo    Wears glasses     Current Outpatient Medications:    albuterol (VENTOLIN HFA) 108 (90 Base) MCG/ACT inhaler, Inhale 1 puff into the lungs every 6 (six) hours as needed for wheezing or shortness of breath., Disp: , Rfl:    allopurinol (ZYLOPRIM) 100 MG tablet, Take 100 mg by mouth daily., Disp: , Rfl:    amLODipine (NORVASC) 5 MG tablet, TAKE 1 TABLET BY MOUTH EVERY DAY (Patient taking differently: Take 5 mg by mouth every morning.), Disp: 90 tablet, Rfl: 3   aspirin EC 81 MG tablet, Take 81 mg by mouth every morning. Swallow whole., Disp: , Rfl:    Cholecalciferol (VITAMIN D3) 20 MCG (800 UNIT) TABS, Take 1 tablet by mouth daily., Disp: , Rfl:     diclofenac Sodium (VOLTAREN) 1 % GEL, Inhale 1 puff into the lungs 2 (two) times daily., Disp: , Rfl:    diclofenac Sodium (VOLTAREN) 1 % GEL, APPLY 2 GRAMS TO AFFECTED AREA 4 TIMES A DAY, Disp: 100 g, Rfl: 5   Ensure (ENSURE), Take 1 Can by mouth 3 (three) times daily between meals. (Patient taking differently: Take 237 mLs by mouth See admin instructions. Drink one can (237 mls) by mouth once or twice daily), Disp: 237 mL, Rfl: 12   FARXIGA 10 MG TABS tablet, Take 10 mg by mouth daily., Disp: , Rfl:    febuxostat (ULORIC) 40 MG tablet, Take 40 mg by mouth daily., Disp: , Rfl:    FLUAD QUADRIVALENT 0.5 ML injection, , Disp: , Rfl:    fluticasone-salmeterol (WIXELA INHUB) 250-50 MCG/ACT AEPB, Inhale 1 puff into the lungs in the morning and at bedtime., Disp: 3 each, Rfl: 3   furosemide (LASIX) 40 MG tablet, Take 1 tablet (40 mg total) by mouth every Monday, Wednesday, and Friday., Disp: , Rfl:    gabapentin (NEURONTIN) 300 MG capsule, TAKE 1 CAPSULE BY MOUTH THREE TIMES A DAY, Disp: 90 capsule, Rfl: 5   Incontinence Supply Disposable (DEPEND UNDERWEAR SM/MED) MISC, Use as directed  four times per day, Disp: 120 each, Rfl: 5   lidocaine (LIDODERM) 5 %, Place 1 patch onto the skin daily. Remove & Discard patch within 12 hours or as directed by MD, Disp: 30 patch, Rfl: 0   losartan (COZAAR) 100 MG tablet, Take 100 mg by mouth daily., Disp: , Rfl:    magnesium gluconate (MAGONATE) 500 MG tablet, TAKE 1 TABLET BY MOUTH TWICE A DAY, Disp: 180 tablet, Rfl: 1   meclizine (ANTIVERT) 12.5 MG tablet, TAKE 1 TABLET BY MOUTH THREE TIMES A DAY AS NEEDED FOR DIZZINESS (Patient taking differently: Take 12.5 mg by mouth 3 (three) times daily as needed for dizziness.), Disp: 90 tablet, Rfl: 2   montelukast (SINGULAIR) 10 MG tablet, TAKE 1 TABLET BY MOUTH EVERY DAY, Disp: 90 tablet, Rfl: 2   Multiple Vitamin (MULTIVITAMIN WITH MINERALS) TABS tablet, Take 1 tablet by mouth daily. Centrum Silver, Disp: , Rfl:     pantoprazole (PROTONIX) 40 MG tablet, TAKE 1 TABLET BY MOUTH EVERY DAY, Disp: 90 tablet, Rfl: 3   potassium chloride (KLOR-CON) 10 MEQ tablet, TAKE 1 TABLET BY MOUTH EVERY DAY WHEN TAKING FUROSEMIDE (Patient taking differently: Take 10 mEq by mouth every Monday, Wednesday, and Friday.), Disp: 90 tablet, Rfl: 3   predniSONE (DELTASONE) 1 MG tablet, Take 1 tablet (1 mg total) by mouth daily with breakfast., Disp: 270 tablet, Rfl: 3   SPIKEVAX syringe, , Disp: , Rfl:    triamcinolone cream (KENALOG) 0.1 %, APPLY TO AFFECTED AREA TWICE A DAY, Disp: 30 g, Rfl: 1  Social History   Tobacco Use  Smoking Status Never   Passive exposure: Never  Smokeless Tobacco Never    Allergies  Allergen Reactions   Hydrocodone Itching   Lasix [Furosemide] Other (See Comments)    Dizziness.    Tizanidine Other (See Comments)    Dizzy and fall   Iron Hives and Other (See Comments)     bad constipation    Objective:  There were no vitals filed for this visit. There is no height or weight on file to calculate BMI. Constitutional Well developed. Well nourished.  Vascular Dorsalis pedis pulses palpable bilaterally. Posterior tibial pulses palpable bilaterally. Capillary refill normal to all digits.  No cyanosis or clubbing noted. Pedal hair growth normal.  Neurologic Normal speech. Oriented to person, place, and time. Epicritic sensation to light touch grossly present bilaterally.  Dermatologic Left fourth and fifth digit heloma molle noted with underlying hammertoe contracture.  Hyperkeratotic lesion noted with central nucleated core.  Pain on palpation to the lesion  Orthopedic: Normal joint ROM without pain or crepitus bilaterally. No visible deformities. No bony tenderness.   Radiographs: None Assessment:   1. Heloma molle    Plan:  Patient was evaluated and treated and all questions answered.  Left fourth and fifth digit heloma molle -All questions or concerns were discussed with the  patient in extensive detail given the amount of heloma molle that is present and causing her pain I believe she will benefit from shoe gear modification and conservative care -I dispensed toe protectors and spacers to help take the pressure off of each other and she states understanding will use them.  No follow-ups on file.

## 2023-03-08 DIAGNOSIS — N184 Chronic kidney disease, stage 4 (severe): Secondary | ICD-10-CM | POA: Diagnosis not present

## 2023-03-15 DIAGNOSIS — I129 Hypertensive chronic kidney disease with stage 1 through stage 4 chronic kidney disease, or unspecified chronic kidney disease: Secondary | ICD-10-CM | POA: Diagnosis not present

## 2023-03-15 DIAGNOSIS — N184 Chronic kidney disease, stage 4 (severe): Secondary | ICD-10-CM | POA: Diagnosis not present

## 2023-03-15 DIAGNOSIS — D631 Anemia in chronic kidney disease: Secondary | ICD-10-CM | POA: Diagnosis not present

## 2023-03-15 DIAGNOSIS — N281 Cyst of kidney, acquired: Secondary | ICD-10-CM | POA: Diagnosis not present

## 2023-03-15 DIAGNOSIS — R3129 Other microscopic hematuria: Secondary | ICD-10-CM | POA: Diagnosis not present

## 2023-03-15 DIAGNOSIS — R809 Proteinuria, unspecified: Secondary | ICD-10-CM | POA: Diagnosis not present

## 2023-03-15 DIAGNOSIS — R7303 Prediabetes: Secondary | ICD-10-CM | POA: Diagnosis not present

## 2023-03-15 DIAGNOSIS — E871 Hypo-osmolality and hyponatremia: Secondary | ICD-10-CM | POA: Diagnosis not present

## 2023-03-15 DIAGNOSIS — N2581 Secondary hyperparathyroidism of renal origin: Secondary | ICD-10-CM | POA: Diagnosis not present

## 2023-03-15 DIAGNOSIS — R600 Localized edema: Secondary | ICD-10-CM | POA: Diagnosis not present

## 2023-03-17 ENCOUNTER — Ambulatory Visit (INDEPENDENT_AMBULATORY_CARE_PROVIDER_SITE_OTHER): Payer: Medicare HMO | Admitting: Internal Medicine

## 2023-03-17 ENCOUNTER — Encounter: Payer: Self-pay | Admitting: Internal Medicine

## 2023-03-17 VITALS — BP 142/94 | HR 72 | Temp 98.3°F | Ht 61.0 in | Wt 158.0 lb

## 2023-03-17 DIAGNOSIS — I5032 Chronic diastolic (congestive) heart failure: Secondary | ICD-10-CM | POA: Diagnosis not present

## 2023-03-17 DIAGNOSIS — I10 Essential (primary) hypertension: Secondary | ICD-10-CM | POA: Diagnosis not present

## 2023-03-17 DIAGNOSIS — N184 Chronic kidney disease, stage 4 (severe): Secondary | ICD-10-CM | POA: Diagnosis not present

## 2023-03-17 DIAGNOSIS — R7302 Impaired glucose tolerance (oral): Secondary | ICD-10-CM

## 2023-03-17 MED ORDER — METOPROLOL SUCCINATE ER 25 MG PO TB24: 25.0000 mg | ORAL_TABLET | Freq: Every day | ORAL | 3 refills | Status: DC

## 2023-03-17 NOTE — Progress Notes (Signed)
Patient ID: [name], female   DOB: 1935-12-02, 87 y.o.   MRN: [id number]        Chief Complaint: follow up HTN, hyperglycemia, chf, ckd3a       HPI:  [name] is a 87 y.o. female Pt denies chest pain, increased sob or doe, wheezing, orthopnea, PND, increased LE swelling, palpitations, dizziness or syncope.   Pt denies polydipsia, polyuria, or new focal neuro s/s.    Pt denies fever, wt loss, night sweats, loss of appetite, or other constitutional symptoms  Saw renal yestserday - celebrex stopped.   Wt Readings from Last 3 Encounters:  03/17/23 158 lb (71.7 kg)  12/15/22 155 lb (70.3 kg)  12/07/22 158 lb (71.7 kg)   BP Readings from Last 3 Encounters:  03/17/23 (!) 142/94  12/15/22 124/70  12/07/22 (!) 152/73         Past Medical History:  Diagnosis Date   Allergic rhinitis 10/30/2016   Anemia    Asthma    Breast cancer of upper-outer quadrant of left female breast (HCC) 11/08/2013   ER/PR+ Her2- Left IDC    Chronic renal insufficiency    Chronic rhinitis    Colon polyp    Diastolic dysfunction 07/10/2016   DJD (degenerative joint disease)    Dyspnea    Frozen shoulder    Full dentures    GERD (gastroesophageal reflux disease)    Hearing loss    Hypertension    Hyponatremia    Impaired glucose tolerance 07/18/2014   Memory loss    Morbid obesity (HCC)    Poor circulation    Vertigo    Wears glasses    Past Surgical History:  Procedure Laterality Date   ABDOMINAL HYSTERECTOMY     BREAST LUMPECTOMY WITH NEEDLE LOCALIZATION AND AXILLARY SENTINEL LYMPH NODE BX Left 12/04/2013   Procedure: BREAST LUMPECTOMY WITH NEEDLE LOCALIZATION AND AXILLARY SENTINEL LYMPH NODE BX;  Surgeon: Kandis Cocking, MD;  Location: [facility];  Service: General;  Laterality: Left;   CATARACT EXTRACTION  2009   rt   COLONOSCOPY     EYE SURGERY Bilateral    cataract surgery   KNEE ARTHROSCOPY     both   LUMBAR LAMINECTOMY/DECOMPRESSION MICRODISCECTOMY Left  12/23/2017   Procedure: Left Lumbar One-Two Laminectomy with microdiscectomy;  Surgeon: Tia Alert, MD;  Location: [facility] OR;  Service: Neurosurgery;  Laterality: Left;  Left L1-2 Laminectomy with microdiscectomy   TONSILLECTOMY     TOTAL KNEE ARTHROPLASTY  2002   rt   TOTAL KNEE ARTHROPLASTY  2003   left   VESICOVAGINAL FISTULA CLOSURE W/ TAH  1980    reports that she has never smoked. She has never been exposed to tobacco smoke. She has never used smokeless tobacco. She reports that she does not drink alcohol and does not use drugs. family history includes Colon cancer in her mother; Healthy in her daughter; Heart attack (age of onset: 56) in her son; Lung cancer in her brother; Stomach cancer in her mother. Allergies  Allergen Reactions   Hydrocodone Itching   Lasix [Furosemide] Other (See Comments)    Dizziness.    Tizanidine Other (See Comments)    Dizzy and fall   Iron Hives and Other (See Comments)     bad constipation    Current Outpatient Medications on File Prior to Visit  Medication Sig Dispense Refill   albuterol (VENTOLIN HFA) 108 (90 Base) MCG/ACT inhaler Inhale 1 puff into the lungs every 6 (  six) hours as needed for wheezing or shortness of breath.     allopurinol (ZYLOPRIM) 100 MG tablet Take 100 mg by mouth daily.     amLODipine (NORVASC) 5 MG tablet TAKE 1 TABLET BY MOUTH EVERY DAY (Patient taking differently: Take 5 mg by mouth every morning.) 90 tablet 3   aspirin EC 81 MG tablet Take 81 mg by mouth every morning. Swallow whole.     Cholecalciferol (VITAMIN D3) 20 MCG (800 UNIT) TABS Take 1 tablet by mouth daily.     diclofenac Sodium (VOLTAREN) 1 % GEL Inhale 1 puff into the lungs 2 (two) times daily.     diclofenac Sodium (VOLTAREN) 1 % GEL APPLY 2 GRAMS TO AFFECTED AREA 4 TIMES A DAY 100 g 5   Ensure (ENSURE) Take 1 Can by mouth 3 (three) times daily between meals. (Patient taking differently: Take 237 mLs by mouth See admin instructions. Drink one can (237 mls)  by mouth once or twice daily) 237 mL 12   FARXIGA 10 MG TABS tablet Take 10 mg by mouth daily.     febuxostat (ULORIC) 40 MG tablet Take 40 mg by mouth daily.     FLUAD QUADRIVALENT 0.5 ML injection      fluticasone-salmeterol (WIXELA INHUB) 250-50 MCG/ACT AEPB Inhale 1 puff into the lungs in the morning and at bedtime. 3 each 3   furosemide (LASIX) 40 MG tablet Take 1 tablet (40 mg total) by mouth every Monday, Wednesday, and Friday.     gabapentin (NEURONTIN) 300 MG capsule TAKE 1 CAPSULE BY MOUTH THREE TIMES A DAY 90 capsule 5   Incontinence Supply Disposable (DEPEND UNDERWEAR SM/MED) MISC Use as directed four times per day 120 each 5   lidocaine (LIDODERM) 5 % Place 1 patch onto the skin daily. Remove & Discard patch within 12 hours or as directed by MD 30 patch 0   losartan (COZAAR) 100 MG tablet Take 100 mg by mouth daily.     magnesium gluconate (MAGONATE) 500 MG tablet TAKE 1 TABLET BY MOUTH TWICE A DAY 180 tablet 1   meclizine (ANTIVERT) 12.5 MG tablet TAKE 1 TABLET BY MOUTH THREE TIMES A DAY AS NEEDED FOR DIZZINESS (Patient taking differently: Take 12.5 mg by mouth 3 (three) times daily as needed for dizziness.) 90 tablet 2   montelukast (SINGULAIR) 10 MG tablet TAKE 1 TABLET BY MOUTH EVERY DAY 90 tablet 2   Multiple Vitamin (MULTIVITAMIN WITH MINERALS) TABS tablet Take 1 tablet by mouth daily. Centrum Silver     pantoprazole (PROTONIX) 40 MG tablet TAKE 1 TABLET BY MOUTH EVERY DAY 90 tablet 3   potassium chloride (KLOR-CON) 10 MEQ tablet TAKE 1 TABLET BY MOUTH EVERY DAY WHEN TAKING FUROSEMIDE (Patient taking differently: Take 10 mEq by mouth every Monday, Wednesday, and Friday.) 90 tablet 3   predniSONE (DELTASONE) 1 MG tablet Take 1 tablet (1 mg total) by mouth daily with breakfast. 270 tablet 3   SPIKEVAX syringe      triamcinolone cream (KENALOG) 0.1 % APPLY TO AFFECTED AREA TWICE A DAY 30 g 1   celecoxib (CELEBREX) 100 MG capsule Take 100 mg by mouth 2 (two) times daily. (Patient  not taking: Reported on 03/17/2023)     No current facility-administered medications on file prior to visit.        ROS:  All others reviewed and negative.  Objective        PE:  BP (!) 142/94 (BP Location: Right Arm, Patient Position: Sitting, Cuff  Size: Normal)   Pulse 72   Temp 98.3 F (36.8 C) (Oral)   Ht 5\' 1"  (1.549 m)   Wt 158 lb (71.7 kg)   SpO2 95%   BMI 29.85 kg/m                 Constitutional: Pt appears in NAD               HENT: Head: NCAT.                Right Ear: External ear normal.                 Left Ear: External ear normal.                Eyes: . Pupils are equal, round, and reactive to light. Conjunctivae and EOM are normal               Nose: without d/c or deformity               Neck: Neck supple. Gross normal ROM               Cardiovascular: Normal rate and regular rhythm.                 Pulmonary/Chest: Effort normal and breath sounds without rales or wheezing.                Abd:  Soft, NT, ND, + BS, no organomegaly               Neurological: Pt is alert. At baseline orientation, motor grossly intact               Skin: Skin is warm. No rashes, no other new lesions, LE edema - none               Psychiatric: Pt behavior is normal without agitation   Micro: none  Cardiac tracings I have personally interpreted today:  none  Pertinent Radiological findings (summarize): none   Lab Results  Component Value Date   WBC 8.7 02/02/2022   HGB 12.3 02/02/2022   HCT 37.2 02/02/2022   PLT 234.0 02/02/2022   GLUCOSE 163 (H) 02/09/2022   CHOL 246 (H) 02/02/2022   TRIG 214.0 (H) 02/02/2022   HDL 85.20 02/02/2022   LDLDIRECT 137.0 02/02/2022   LDLCALC 111 (H) 01/24/2021   ALT 32 02/02/2022   AST 22 02/02/2022   NA 144 02/09/2022   K 4.0 02/09/2022   CL 108 02/09/2022   CREATININE 1.46 (H) 02/09/2022   BUN 40 (H) 02/09/2022   CO2 28 02/09/2022   TSH 1.46 02/02/2022   INR 1.0 07/27/2021   HGBA1C 5.7 (A) 09/29/2022   Assessment/Plan:   Manuela LEISHA CHRYST is a 87 y.o. Black or African American [2] female with  has a past medical history of Allergic rhinitis (10/30/2016), Anemia, Asthma, Breast cancer of upper-outer quadrant of left female breast (HCC) (11/08/2013), Chronic renal insufficiency, Chronic rhinitis, Colon polyp, Diastolic dysfunction (07/10/2016), DJD (degenerative joint disease), Dyspnea, Frozen shoulder, Full dentures, GERD (gastroesophageal reflux disease), Hearing loss, Hypertension, Hyponatremia, Impaired glucose tolerance (07/18/2014), Memory loss, Morbid obesity (HCC), Poor circulation, Vertigo, and Wears glasses.  Chronic diastolic CHF (congestive heart failure) (HCC) Stable, cont current med tx - lasix 40 every mon wed fri  Essential hypertension BP Readings from Last 3 Encounters:  03/17/23 (!) 142/94  12/15/22 124/70  12/07/22 (!) 152/73   Uncontrolled, ok to add toprol xl  25 qd    CKD (chronic kidney disease) stage 4, GFR 15-29 ml/min (HCC) Lab Results  Component Value Date   CREATININE 1.46 (H) 02/09/2022   Stable overall, cont to avoid nephrotoxins   Impaired glucose tolerance Lab Results  Component Value Date   HGBA1C 5.7 (A) 09/29/2022   Stable, pt to continue current medical treatment farxiga 10 qd  Followup: Return in about 6 months (around 09/17/2023).  Oliver Barre, MD 03/20/2023 5:28 AM [facility]

## 2023-03-17 NOTE — Patient Instructions (Addendum)
Please take all new medication as prescribed - the toprol xl 25 mg per day (metoprolol)  Please continue all other medications as before, and refills have been done if requested.  Please have the pharmacy call with any other refills you may need.  Please continue your efforts at being more active, low cholesterol diet, and weight control.  Please keep your appointments with your specialists as you may have planned - kidney and heart doctors  Please make an Appointment to return in 6 months, or sooner if needed

## 2023-03-20 ENCOUNTER — Encounter: Payer: Self-pay | Admitting: Internal Medicine

## 2023-03-20 NOTE — Assessment & Plan Note (Signed)
Stable, cont current med tx - lasix 40 every mon wed fri

## 2023-03-20 NOTE — Assessment & Plan Note (Signed)
BP Readings from Last 3 Encounters:  03/17/23 (!) 142/94  12/15/22 124/70  12/07/22 (!) 152/73   Uncontrolled, ok to add toprol xl 25 qd

## 2023-03-20 NOTE — Assessment & Plan Note (Signed)
Lab Results  Component Value Date   CREATININE 1.46 (H) 02/09/2022   Stable overall, cont to avoid nephrotoxins

## 2023-03-20 NOTE — Assessment & Plan Note (Signed)
Lab Results  Component Value Date   HGBA1C 5.7 (A) 09/29/2022   Stable, pt to continue current medical treatment farxiga 10 qd

## 2023-03-25 ENCOUNTER — Telehealth: Payer: Self-pay | Admitting: Internal Medicine

## 2023-03-25 MED ORDER — ALBUTEROL SULFATE HFA 108 (90 BASE) MCG/ACT IN AERS: 1.0000 | INHALATION_SPRAY | Freq: Four times a day (QID) | RESPIRATORY_TRACT | 5 refills | Status: DC | PRN

## 2023-03-25 NOTE — Telephone Encounter (Signed)
Jae Dire from [place] (nurse case Production designer, theatre/television/film) said that patient needs an albuterol inhaler called in because she is having some break through breathing problems (shortness of breath).  [facility] [address] [facility] [address] number:  [phone]

## 2023-03-25 NOTE — Telephone Encounter (Signed)
Ok done erx 

## 2023-03-26 NOTE — Telephone Encounter (Signed)
Called pt inform her MD [facility].../lmb

## 2023-03-30 ENCOUNTER — Other Ambulatory Visit: Payer: Self-pay | Admitting: Internal Medicine

## 2023-04-07 ENCOUNTER — Ambulatory Visit: Payer: Medicare HMO | Admitting: Podiatry

## 2023-04-14 ENCOUNTER — Ambulatory Visit: Payer: Medicare HMO | Admitting: Podiatry

## 2023-04-14 DIAGNOSIS — M79674 Pain in right toe(s): Secondary | ICD-10-CM

## 2023-04-14 DIAGNOSIS — B351 Tinea unguium: Secondary | ICD-10-CM | POA: Diagnosis not present

## 2023-04-14 DIAGNOSIS — M79675 Pain in left toe(s): Secondary | ICD-10-CM | POA: Diagnosis not present

## 2023-04-14 DIAGNOSIS — I739 Peripheral vascular disease, unspecified: Secondary | ICD-10-CM | POA: Diagnosis not present

## 2023-04-14 NOTE — Progress Notes (Signed)
  Subjective:  Patient ID: [name], female    DOB: 20-Oct-1936,  MRN: [id number]  Chief Complaint  Patient presents with   Nail Problem     Possible routine foot care   87 y.o. female returns for the above complaint.  Patient presents with thickened elongated dystrophic mycotic toenails x 10 mild pain on palpation patient would like for me to debride down she is not able to do it herself.  Objective:  There were no vitals filed for this visit. Podiatric Exam: Vascular: dorsalis pedis and posterior tibial pulses are palpable bilateral. Capillary return is immediate. Temperature gradient is WNL. Skin turgor WNL  Sensorium: Normal Semmes Weinstein monofilament test. Normal tactile sensation bilaterally. Nail Exam: Pt has thick disfigured discolored nails with subungual debris noted bilateral entire nail hallux through fifth toenails.  Pain on palpation to the nails. Ulcer Exam: There is no evidence of ulcer or pre-ulcerative changes or infection. Orthopedic Exam: Muscle tone and strength are WNL. No limitations in general ROM. No crepitus or effusions noted.  Skin: No Porokeratosis. No infection or ulcers    Assessment & Plan:   1. Pain due to onychomycosis of toenails of both feet   2. PAD (peripheral artery disease) (HCC)     Patient was evaluated and treated and all questions answered.  Onychomycosis with pain  -Nails palliatively debrided as below. -Educated on self-care  Procedure: Nail Debridement Rationale: pain  Type of Debridement: manual, sharp debridement. Instrumentation: Nail nipper, rotary burr. Number of Nails: 10  Procedures and Treatment: Consent by patient was obtained for treatment procedures. The patient understood the discussion of treatment and procedures well. All questions were answered thoroughly reviewed. Debridement of mycotic and hypertrophic toenails, 1 through 5 bilateral and clearing of subungual debris. No ulceration, no infection noted.   Return Visit-Office Procedure: Patient instructed to return to the office for a follow up visit 3 months for continued evaluation and treatment.  Nicholes Rough, DPM    No follow-ups on file.

## 2023-05-04 ENCOUNTER — Other Ambulatory Visit: Payer: Self-pay

## 2023-05-04 MED ORDER — MAGNESIUM GLUCONATE 500 MG PO TABS: 500.0000 mg | ORAL_TABLET | Freq: Two times a day (BID) | ORAL | 1 refills | Status: DC

## 2023-05-06 ENCOUNTER — Other Ambulatory Visit: Payer: Self-pay

## 2023-05-06 MED ORDER — MAGNESIUM GLUCONATE 500 MG PO TABS: 500.0000 mg | ORAL_TABLET | Freq: Two times a day (BID) | ORAL | 1 refills | Status: DC

## 2023-05-06 NOTE — Telephone Encounter (Signed)
Request for Magnesium Gluc 500 MG refill.

## 2023-05-07 ENCOUNTER — Telehealth: Payer: Self-pay | Admitting: Internal Medicine

## 2023-05-07 NOTE — Telephone Encounter (Signed)
Prescription Request  05/07/2023  LOV: 03/17/2023  What is the name of the medication or equipment? predniSONE (DELTASONE) 1 MG tablet   Have you contacted your pharmacy to request a refill? No   Which pharmacy would you like this sent to?  [id number] [place], [place] - [address] AT [address]NER OF [facility] [address] [place] [address] Phone: [phone] Fax: [phone]    Patient notified that their request is being sent to the clinical staff for review and that they should receive a response within 2 business days.   Please advise at Mobile [phone] (mobile)   Pt states that usually Dr. Jonny Ruiz refill her prednisone for her.

## 2023-05-07 NOTE — Telephone Encounter (Signed)
No sorry that is not true  She should have refills based on her EMR record per another provider -    predniSONE (DELTASONE) 1 MG tablet Take 1 tablet (1 mg total) by mouth daily with breakfast. Dispense: 270 tablet, Refills: 3 ordered  12/07/2022 -- Horton Chin, MD          Summary: Take 1 tablet (1 mg total) by mouth daily with breakfast., Starting Mon 12/07/2022, Normal Dose, Route, Frequency: 1 mg, Oral, Daily with breakfastDx Associated: End Date Warning: Start Date & Time: 02/12/2024Ord/Sold: 12/07/2022 (O)Ordered On: 02/12/2024ReportPharmacy: [id number] [place], [place] - [address] AT [address] OF [facility] [address]      Ordering Department: [facility]

## 2023-05-10 ENCOUNTER — Ambulatory Visit: Payer: Medicare HMO | Admitting: Family Medicine

## 2023-05-10 ENCOUNTER — Other Ambulatory Visit: Payer: Self-pay

## 2023-05-10 VITALS — BP 142/76 | HR 68 | Ht 61.0 in | Wt 156.0 lb

## 2023-05-10 DIAGNOSIS — M25551 Pain in right hip: Secondary | ICD-10-CM

## 2023-05-10 DIAGNOSIS — G5711 Meralgia paresthetica, right lower limb: Secondary | ICD-10-CM

## 2023-05-10 NOTE — Progress Notes (Unsigned)
Rubin Payor, PhD, LAT, ATC acting as a scribe for Clementeen Graham, MD.  [name] is a 87 y.o. female who presents to [facility] today for exacerbation of her R hip pain. Pt was last seen by Dr. Denyse Amass on 10/21/22 and was given a R GT steroid injection and was advised to cont home health PT.   Today, pt reports prior steroid injection was no beneficial. R hip pain has cont'd. Pt locates pain to the lateral aspect of her R thigh and hip. No LBP. Weakness noted.  She notes burning tingling pain with decreased skin sensation in the lateral thigh right side.  Dx imaging: 09/29/2022 bilat hip XR   Pertinent review of systems: No fevers or chills  Relevant historical information: Scoliosis   Exam:  BP (!) 142/76   Pulse 68   Ht 5\' 1"  (1.549 m)   Wt 156 lb (70.8 kg)   SpO2 98%   BMI 29.48 kg/m  General: Well Developed, well nourished, and in no acute distress.   MSK: Right hip normal-appearing Pressure over her ASIS does tend to worsen her paresthesia pain right lateral thigh. Top of the iliac crest laterally is very close to the bottom of the ribs on the right.    Lab and Radiology Results  Procedure: Real-time Ultrasound Guided Injection of lateral femoral cutaneous nerve right side Device: Philips Affiniti 50G/GE Logiq Images permanently stored and available for review in PACS Verbal informed consent obtained.  Discussed risks and benefits of procedure. Warned about infection, bleeding, hyperglycemia damage to structures among others. Patient expresses understanding and agreement Time-out conducted.   Noted no overlying erythema, induration, or other signs of local infection.   Skin prepped in a sterile fashion.   Local anesthesia: Topical Ethyl chloride.   With sterile technique and under real time ultrasound guidance: 40 mg of Kenalog and 2 mL of Marcaine injected into soft tissue around the lateral femoral cutaneous nerve. Fluid  seen entering the soft tissue.   Completed without difficulty   Pain moderately resolved suggesting accurate placement of the medication.   Advised to call if fevers/chills, erythema, induration, drainage, or persistent bleeding.   Images permanently stored and available for review in the ultrasound unit.  Impression: Technically successful ultrasound guided injection.         Assessment and Plan: 87 y.o. female with right lateral thigh pain thought to be probably meralgia paresthetica.  She had a trial of the lateral femoral cutaneous nerve injection. I had a challenging time finding the nerve on ultrasound prior to injection I think because of her body positioning and scoliosis.  This is probably the reason she has meralgia paresthetica in the first place.  If today's injection does not go very well I would recommend that we try it again in the future. Recheck back as needed.   PDMP not reviewed this encounter. Orders Placed This Encounter  Procedures   [place] LIMITED JOINT SPACE STRUCTURES LOW RIGHT(NO LINKED CHARGES)    Order Specific Question:   Reason for Exam (SYMPTOM  OR DIAGNOSIS REQUIRED)    Answer:   right hip pain    Order Specific Question:   Preferred imaging location?    Answer:   [facility]   No orders of the defined types were placed in this encounter.    Discussed warning signs or symptoms. Please see discharge instructions. Patient expresses understanding.   The above documentation has been reviewed and  is accurate and complete Clementeen Graham, M.D.

## 2023-05-10 NOTE — Patient Instructions (Addendum)
Thank you for coming in today.   You received an injection today. Seek immediate medical attention if the joint becomes red, extremely painful, or is oozing fluid.   Check back as needed 

## 2023-05-17 ENCOUNTER — Telehealth: Payer: Self-pay | Admitting: Internal Medicine

## 2023-05-17 MED ORDER — DAPAGLIFLOZIN PROPANEDIOL 10 MG PO TABS: 10.0000 mg | ORAL_TABLET | Freq: Every day | ORAL | 3 refills | Status: DC

## 2023-05-17 NOTE — Telephone Encounter (Signed)
Med is on med list, but MD never filled. Is this ok../l;mb

## 2023-05-17 NOTE — Telephone Encounter (Signed)
Prescription Request  05/17/2023  LOV: 03/17/2023  What is the name of the medication or equipment?  FARXIGA 10 MG TABS tablet   Have you contacted your pharmacy to request a refill? Yes   Which pharmacy would you like this sent to?  [id number] [place], [place] - [address] AT [address] OF [facility] [address] [place] [address] Phone: [phone] Fax: [phone]    Patient notified that their request is being sent to the clinical staff for review and that they should receive a response within 2 business days.   Please advise at [facility] [phone]

## 2023-05-17 NOTE — Telephone Encounter (Signed)
Ok done erx 

## 2023-05-26 DIAGNOSIS — M19011 Primary osteoarthritis, right shoulder: Secondary | ICD-10-CM | POA: Diagnosis not present

## 2023-05-26 DIAGNOSIS — M19012 Primary osteoarthritis, left shoulder: Secondary | ICD-10-CM | POA: Diagnosis not present

## 2023-05-26 DIAGNOSIS — E7800 Pure hypercholesterolemia, unspecified: Secondary | ICD-10-CM | POA: Diagnosis not present

## 2023-05-26 DIAGNOSIS — I509 Heart failure, unspecified: Secondary | ICD-10-CM | POA: Diagnosis not present

## 2023-05-26 DIAGNOSIS — M109 Gout, unspecified: Secondary | ICD-10-CM | POA: Diagnosis not present

## 2023-05-26 DIAGNOSIS — I10 Essential (primary) hypertension: Secondary | ICD-10-CM | POA: Diagnosis not present

## 2023-05-26 DIAGNOSIS — R7303 Prediabetes: Secondary | ICD-10-CM | POA: Diagnosis not present

## 2023-05-26 DIAGNOSIS — M47816 Spondylosis without myelopathy or radiculopathy, lumbar region: Secondary | ICD-10-CM | POA: Diagnosis not present

## 2023-05-26 DIAGNOSIS — N184 Chronic kidney disease, stage 4 (severe): Secondary | ICD-10-CM | POA: Diagnosis not present

## 2023-05-26 DIAGNOSIS — K219 Gastro-esophageal reflux disease without esophagitis: Secondary | ICD-10-CM | POA: Diagnosis not present

## 2023-05-26 DIAGNOSIS — Z79899 Other long term (current) drug therapy: Secondary | ICD-10-CM | POA: Diagnosis not present

## 2023-05-26 DIAGNOSIS — E559 Vitamin D deficiency, unspecified: Secondary | ICD-10-CM | POA: Diagnosis not present

## 2023-05-26 DIAGNOSIS — R5383 Other fatigue: Secondary | ICD-10-CM | POA: Diagnosis not present

## 2023-05-26 DIAGNOSIS — M129 Arthropathy, unspecified: Secondary | ICD-10-CM | POA: Diagnosis not present

## 2023-05-26 DIAGNOSIS — R0602 Shortness of breath: Secondary | ICD-10-CM | POA: Diagnosis not present

## 2023-05-28 DIAGNOSIS — R5383 Other fatigue: Secondary | ICD-10-CM | POA: Diagnosis not present

## 2023-05-28 DIAGNOSIS — M109 Gout, unspecified: Secondary | ICD-10-CM | POA: Diagnosis not present

## 2023-05-28 DIAGNOSIS — Z7952 Long term (current) use of systemic steroids: Secondary | ICD-10-CM | POA: Diagnosis not present

## 2023-05-28 DIAGNOSIS — E785 Hyperlipidemia, unspecified: Secondary | ICD-10-CM | POA: Diagnosis not present

## 2023-05-28 DIAGNOSIS — Z79899 Other long term (current) drug therapy: Secondary | ICD-10-CM | POA: Diagnosis not present

## 2023-05-28 DIAGNOSIS — E559 Vitamin D deficiency, unspecified: Secondary | ICD-10-CM | POA: Diagnosis not present

## 2023-05-28 DIAGNOSIS — M129 Arthropathy, unspecified: Secondary | ICD-10-CM | POA: Diagnosis not present

## 2023-05-28 DIAGNOSIS — N184 Chronic kidney disease, stage 4 (severe): Secondary | ICD-10-CM | POA: Diagnosis not present

## 2023-05-28 DIAGNOSIS — M19011 Primary osteoarthritis, right shoulder: Secondary | ICD-10-CM | POA: Diagnosis not present

## 2023-05-28 DIAGNOSIS — M19012 Primary osteoarthritis, left shoulder: Secondary | ICD-10-CM | POA: Diagnosis not present

## 2023-05-28 DIAGNOSIS — M47816 Spondylosis without myelopathy or radiculopathy, lumbar region: Secondary | ICD-10-CM | POA: Diagnosis not present

## 2023-05-28 DIAGNOSIS — K219 Gastro-esophageal reflux disease without esophagitis: Secondary | ICD-10-CM | POA: Diagnosis not present

## 2023-05-28 DIAGNOSIS — I509 Heart failure, unspecified: Secondary | ICD-10-CM | POA: Diagnosis not present

## 2023-05-28 DIAGNOSIS — I130 Hypertensive heart and chronic kidney disease with heart failure and stage 1 through stage 4 chronic kidney disease, or unspecified chronic kidney disease: Secondary | ICD-10-CM | POA: Diagnosis not present

## 2023-05-28 DIAGNOSIS — G8929 Other chronic pain: Secondary | ICD-10-CM | POA: Diagnosis not present

## 2023-05-28 DIAGNOSIS — R7303 Prediabetes: Secondary | ICD-10-CM | POA: Diagnosis not present

## 2023-05-28 DIAGNOSIS — J302 Other seasonal allergic rhinitis: Secondary | ICD-10-CM | POA: Diagnosis not present

## 2023-05-28 DIAGNOSIS — Z7984 Long term (current) use of oral hypoglycemic drugs: Secondary | ICD-10-CM | POA: Diagnosis not present

## 2023-05-28 DIAGNOSIS — Z7982 Long term (current) use of aspirin: Secondary | ICD-10-CM | POA: Diagnosis not present

## 2023-06-02 DIAGNOSIS — Z7984 Long term (current) use of oral hypoglycemic drugs: Secondary | ICD-10-CM | POA: Diagnosis not present

## 2023-06-02 DIAGNOSIS — M19012 Primary osteoarthritis, left shoulder: Secondary | ICD-10-CM | POA: Diagnosis not present

## 2023-06-02 DIAGNOSIS — E785 Hyperlipidemia, unspecified: Secondary | ICD-10-CM | POA: Diagnosis not present

## 2023-06-02 DIAGNOSIS — R5383 Other fatigue: Secondary | ICD-10-CM | POA: Diagnosis not present

## 2023-06-02 DIAGNOSIS — J302 Other seasonal allergic rhinitis: Secondary | ICD-10-CM | POA: Diagnosis not present

## 2023-06-02 DIAGNOSIS — I130 Hypertensive heart and chronic kidney disease with heart failure and stage 1 through stage 4 chronic kidney disease, or unspecified chronic kidney disease: Secondary | ICD-10-CM | POA: Diagnosis not present

## 2023-06-02 DIAGNOSIS — R7303 Prediabetes: Secondary | ICD-10-CM | POA: Diagnosis not present

## 2023-06-02 DIAGNOSIS — M129 Arthropathy, unspecified: Secondary | ICD-10-CM | POA: Diagnosis not present

## 2023-06-02 DIAGNOSIS — Z7952 Long term (current) use of systemic steroids: Secondary | ICD-10-CM | POA: Diagnosis not present

## 2023-06-02 DIAGNOSIS — G8929 Other chronic pain: Secondary | ICD-10-CM | POA: Diagnosis not present

## 2023-06-02 DIAGNOSIS — E559 Vitamin D deficiency, unspecified: Secondary | ICD-10-CM | POA: Diagnosis not present

## 2023-06-02 DIAGNOSIS — Z7982 Long term (current) use of aspirin: Secondary | ICD-10-CM | POA: Diagnosis not present

## 2023-06-02 DIAGNOSIS — M109 Gout, unspecified: Secondary | ICD-10-CM | POA: Diagnosis not present

## 2023-06-02 DIAGNOSIS — M19011 Primary osteoarthritis, right shoulder: Secondary | ICD-10-CM | POA: Diagnosis not present

## 2023-06-02 DIAGNOSIS — Z79899 Other long term (current) drug therapy: Secondary | ICD-10-CM | POA: Diagnosis not present

## 2023-06-02 DIAGNOSIS — I509 Heart failure, unspecified: Secondary | ICD-10-CM | POA: Diagnosis not present

## 2023-06-02 DIAGNOSIS — M47816 Spondylosis without myelopathy or radiculopathy, lumbar region: Secondary | ICD-10-CM | POA: Diagnosis not present

## 2023-06-02 DIAGNOSIS — N184 Chronic kidney disease, stage 4 (severe): Secondary | ICD-10-CM | POA: Diagnosis not present

## 2023-06-02 DIAGNOSIS — K219 Gastro-esophageal reflux disease without esophagitis: Secondary | ICD-10-CM | POA: Diagnosis not present

## 2023-06-03 DIAGNOSIS — M109 Gout, unspecified: Secondary | ICD-10-CM | POA: Diagnosis not present

## 2023-06-03 DIAGNOSIS — K219 Gastro-esophageal reflux disease without esophagitis: Secondary | ICD-10-CM | POA: Diagnosis not present

## 2023-06-03 DIAGNOSIS — E559 Vitamin D deficiency, unspecified: Secondary | ICD-10-CM | POA: Diagnosis not present

## 2023-06-03 DIAGNOSIS — I509 Heart failure, unspecified: Secondary | ICD-10-CM | POA: Diagnosis not present

## 2023-06-03 DIAGNOSIS — Z7984 Long term (current) use of oral hypoglycemic drugs: Secondary | ICD-10-CM | POA: Diagnosis not present

## 2023-06-03 DIAGNOSIS — M129 Arthropathy, unspecified: Secondary | ICD-10-CM | POA: Diagnosis not present

## 2023-06-03 DIAGNOSIS — R5383 Other fatigue: Secondary | ICD-10-CM | POA: Diagnosis not present

## 2023-06-03 DIAGNOSIS — J302 Other seasonal allergic rhinitis: Secondary | ICD-10-CM | POA: Diagnosis not present

## 2023-06-03 DIAGNOSIS — E785 Hyperlipidemia, unspecified: Secondary | ICD-10-CM | POA: Diagnosis not present

## 2023-06-03 DIAGNOSIS — M19011 Primary osteoarthritis, right shoulder: Secondary | ICD-10-CM | POA: Diagnosis not present

## 2023-06-03 DIAGNOSIS — N184 Chronic kidney disease, stage 4 (severe): Secondary | ICD-10-CM | POA: Diagnosis not present

## 2023-06-03 DIAGNOSIS — Z7952 Long term (current) use of systemic steroids: Secondary | ICD-10-CM | POA: Diagnosis not present

## 2023-06-03 DIAGNOSIS — Z7982 Long term (current) use of aspirin: Secondary | ICD-10-CM | POA: Diagnosis not present

## 2023-06-03 DIAGNOSIS — Z79899 Other long term (current) drug therapy: Secondary | ICD-10-CM | POA: Diagnosis not present

## 2023-06-03 DIAGNOSIS — M19012 Primary osteoarthritis, left shoulder: Secondary | ICD-10-CM | POA: Diagnosis not present

## 2023-06-03 DIAGNOSIS — G8929 Other chronic pain: Secondary | ICD-10-CM | POA: Diagnosis not present

## 2023-06-03 DIAGNOSIS — I130 Hypertensive heart and chronic kidney disease with heart failure and stage 1 through stage 4 chronic kidney disease, or unspecified chronic kidney disease: Secondary | ICD-10-CM | POA: Diagnosis not present

## 2023-06-03 DIAGNOSIS — R7303 Prediabetes: Secondary | ICD-10-CM | POA: Diagnosis not present

## 2023-06-03 DIAGNOSIS — M47816 Spondylosis without myelopathy or radiculopathy, lumbar region: Secondary | ICD-10-CM | POA: Diagnosis not present

## 2023-06-07 ENCOUNTER — Encounter: Payer: Medicare HMO | Admitting: Physical Medicine and Rehabilitation

## 2023-06-08 ENCOUNTER — Encounter: Payer: Medicare HMO | Attending: Physical Medicine and Rehabilitation | Admitting: Physical Medicine and Rehabilitation

## 2023-06-08 VITALS — BP 152/83 | HR 58 | Ht 61.0 in | Wt 158.0 lb

## 2023-06-08 DIAGNOSIS — M25511 Pain in right shoulder: Secondary | ICD-10-CM | POA: Diagnosis not present

## 2023-06-08 DIAGNOSIS — M25512 Pain in left shoulder: Secondary | ICD-10-CM | POA: Diagnosis not present

## 2023-06-08 DIAGNOSIS — M25552 Pain in left hip: Secondary | ICD-10-CM

## 2023-06-08 DIAGNOSIS — G8929 Other chronic pain: Secondary | ICD-10-CM | POA: Diagnosis not present

## 2023-06-08 DIAGNOSIS — M25551 Pain in right hip: Secondary | ICD-10-CM | POA: Diagnosis not present

## 2023-06-08 MED ORDER — PREDNISONE 5 MG PO TABS
5.0000 mg | ORAL_TABLET | Freq: Every day | ORAL | 3 refills | Status: DC
Start: 2023-06-08 — End: 2023-12-24

## 2023-06-08 NOTE — Patient Instructions (Signed)
HTN: Advised checking BP daily at home and logging results to bring into follow-up appointment with PCP and myself. -Reviewed BP meds today.  -Advised regarding healthy foods that can help lower blood pressure and provided with a list: 1) citrus foods- high in vitamins and minerals 2) salmon and other fatty fish - reduces inflammation and oxylipins 3) swiss chard (leafy green)- high level of nitrates 4) pumpkin seeds- one of the best natural sources of magnesium 5) Beans and lentils- high in fiber, magnesium, and potassium 6) Berries- high in flavonoids 7) Amaranth (whole grain, can be cooked similarly to rice and oats)- high in magnesium and fiber 8) Pistachios- even more effective at reducing BP than other nuts 9) Carrots- high in phenolic compounds that relax blood vessels and reduce inflammation 10) Celery- contain phthalides that relax tissues of arterial walls 11) Tomatoes- can also improve cholesterol and reduce risk of heart disease 12) Broccoli- good source of magnesium, calcium, and potassium 13) Greek yogurt: high in potassium and calcium 14) Herbs and spices: Celery seed, cilantro, saffron, lemongrass, black cumin, ginseng, cinnamon, cardamom, sweet basil, and ginger 15) Chia and flax seeds- also help to lower cholesterol and blood sugar 16) Beets- high levels of nitrates that relax blood vessels  17) spinach and bananas- high in potassium  -Provided lise of supplements that can help with hypertension:  1) magnesium: one high quality brand is Bioptemizers since it contains all 7 types of magnesium, otherwise over the counter magnesium gluconate 400mg is a good option 2) B vitamins 3) vitamin D 4) potassium 5) CoQ10 6) L-arginine 7) Vitamin C 8) Beetroot -Educated that goal BP is 120/80. -Made goal to incorporate some of the above foods into diet.   

## 2023-06-08 NOTE — Progress Notes (Signed)
Subjective:    Patient ID: [name], female    DOB: Sep 30, 1936, 87 y.o.   MRN: [id number]  1) Bilateral shoulder pain: -[name] is an 87 year old woman who presents for follow-up of pain in her bilateral shoulders.   -she is interested in trying tens unit  -she has been having benefit with prednisone 5mg  daily as needed   2) Bilateral hip pain -also has pain in hips  -had no benefit from right hip steroid injection -oral steroids help. That is the thing that really helps her. She has been taking 2mg  daily. She is worried about taking too much steroids -hasn't used lidocaine patches  She had no benefit from the corticosteroid shots. She felt sick afterward. Her daughter has to bring her to her appointments so she was not able to come in earlier. She is taking oral prednisone- she started yesterday. Has not yet been able to feel any benefit. She currently feels pain in both shoulders and thighs.   She prefers less medications. She is willing to try gel and patches and anti-inflammatory foods.   She is having difficulty raising her hands and thus performing ADLs due to her pain. She does have excellent family support for which she is very thankful.    Her daughter calls in today.   Her doctor stopped the prednisone because of her kidneys. The prednisone really helped. Her daughter would prefer using the steroids as needed rather than trying something else   Pain Inventory Average Pain 6 Pain Right Now 5 My pain is dull  In the last 24 hours, has pain interfered with the following? General activity 7 Relation with others 7 Enjoyment of life 7 What TIME of day is your pain at its worst? morning  Sleep (in general) Good  Pain is worse with: inactivity and standing Pain improves with: rest, heat/ice, medication, and injections Relief from Meds: 6  Family History  Problem Relation Age of Onset   Colon cancer Mother        in her 25's   Stomach cancer Mother     Lung cancer Brother        was a smoker   Heart attack Son 35   Healthy Daughter    Social History   Socioeconomic History   Marital status: Widowed    Spouse name: Not on file   Number of children: 2   Years of education: Not on file   Highest education level: Not on file  Occupational History   Occupation: owns bakery and works PT for [employer]  Tobacco Use   Smoking status: Never    Passive exposure: Never   Smokeless tobacco: Never  Vaping Use   Vaping status: Never Used  Substance and Sexual Activity   Alcohol use: No    Alcohol/week: 0.0 standard drinks of alcohol   Drug use: No   Sexual activity: Not Currently  Other Topics Concern   Not on file  Social History Narrative   Not on file   Social Determinants of Health   Financial Resource Strain: Low Risk  (05/29/2022)   Overall Financial Resource Strain (CARDIA)    Difficulty of Paying Living Expenses: Not hard at all  Food Insecurity: No Food Insecurity (05/29/2022)   Hunger Vital Sign    Worried About Running Out of Food in the Last Year: Never true    Ran Out of Food in the Last Year: Never true  Transportation Needs: No Transportation Needs (  05/29/2022)   PRAPARE - Administrator, Civil Service (Medical): No    Lack of Transportation (Non-Medical): No  Physical Activity: Insufficiently Active (05/29/2022)   Exercise Vital Sign    Days of Exercise per Week: 4 days    Minutes of Exercise per Session: 20 min  Stress: No Stress Concern Present (05/29/2022)   [employer] of Occupational Health - Occupational Stress Questionnaire    Feeling of Stress : Not at all  Social Connections: Moderately Integrated (05/29/2022)   Social Connection and Isolation Panel [NHANES]    Frequency of Communication with Friends and Family: More than three times a week    Frequency of Social Gatherings with Friends and Family: More than three times a week    Attends Religious Services: More than 4 times per year     Active Member of [employer] or Organizations: Yes    Attends Banker Meetings: More than 4 times per year    Marital Status: Widowed   Past Surgical History:  Procedure Laterality Date   ABDOMINAL HYSTERECTOMY     BREAST LUMPECTOMY WITH NEEDLE LOCALIZATION AND AXILLARY SENTINEL LYMPH NODE BX Left 12/04/2013   Procedure: BREAST LUMPECTOMY WITH NEEDLE LOCALIZATION AND AXILLARY SENTINEL LYMPH NODE BX;  Surgeon: Kandis Cocking, MD;  Location: [facility];  Service: General;  Laterality: Left;   CATARACT EXTRACTION  2009   rt   COLONOSCOPY     EYE SURGERY Bilateral    cataract surgery   KNEE ARTHROSCOPY     both   LUMBAR LAMINECTOMY/DECOMPRESSION MICRODISCECTOMY Left 12/23/2017   Procedure: Left Lumbar One-Two Laminectomy with microdiscectomy;  Surgeon: Tia Alert, MD;  Location: [facility] OR;  Service: Neurosurgery;  Laterality: Left;  Left L1-2 Laminectomy with microdiscectomy   TONSILLECTOMY     TOTAL KNEE ARTHROPLASTY  2002   rt   TOTAL KNEE ARTHROPLASTY  2003   left   VESICOVAGINAL FISTULA CLOSURE W/ TAH  1980   Past Surgical History:  Procedure Laterality Date   ABDOMINAL HYSTERECTOMY     BREAST LUMPECTOMY WITH NEEDLE LOCALIZATION AND AXILLARY SENTINEL LYMPH NODE BX Left 12/04/2013   Procedure: BREAST LUMPECTOMY WITH NEEDLE LOCALIZATION AND AXILLARY SENTINEL LYMPH NODE BX;  Surgeon: Kandis Cocking, MD;  Location: [facility];  Service: General;  Laterality: Left;   CATARACT EXTRACTION  2009   rt   COLONOSCOPY     EYE SURGERY Bilateral    cataract surgery   KNEE ARTHROSCOPY     both   LUMBAR LAMINECTOMY/DECOMPRESSION MICRODISCECTOMY Left 12/23/2017   Procedure: Left Lumbar One-Two Laminectomy with microdiscectomy;  Surgeon: Tia Alert, MD;  Location: [facility] OR;  Service: Neurosurgery;  Laterality: Left;  Left L1-2 Laminectomy with microdiscectomy   TONSILLECTOMY     TOTAL KNEE ARTHROPLASTY  2002   rt   TOTAL KNEE ARTHROPLASTY  2003   left    VESICOVAGINAL FISTULA CLOSURE W/ TAH  1980   Past Medical History:  Diagnosis Date   Allergic rhinitis 10/30/2016   Anemia    Asthma    Breast cancer of upper-outer quadrant of left female breast (HCC) 11/08/2013   ER/PR+ Her2- Left IDC    Chronic renal insufficiency    Chronic rhinitis    Colon polyp    Diastolic dysfunction 07/10/2016   DJD (degenerative joint disease)    Dyspnea    Frozen shoulder    Full dentures    GERD (gastroesophageal reflux disease)    Hearing loss  Hypertension    Hyponatremia    Impaired glucose tolerance 07/18/2014   Memory loss    Morbid obesity (HCC)    Poor circulation    Vertigo    Wears glasses    BP (!) 152/83   Pulse (!) 58   Ht 5\' 1"  (1.549 m)   Wt 158 lb (71.7 kg)   SpO2 94%   BMI 29.85 kg/m   Opioid Risk Score:   Fall Risk Score:  `1  Depression screen [facility] 2/9     03/17/2023    3:56 PM 12/15/2022    2:53 PM 12/07/2022   10:05 AM 09/29/2022    8:50 AM 05/29/2022   11:44 AM 04/30/2022    2:59 PM 04/23/2022   10:08 AM  Depression screen PHQ 2/9  Decreased Interest 0 0 0 0 0 0 0  Down, Depressed, Hopeless 0 0 0 0 0 0 0  PHQ - 2 Score 0 0 0 0 0 0 0  Altered sleeping  0  0 0 0   Tired, decreased energy  0  0 0 0   Change in appetite  0  0 0 0   Feeling bad or failure about yourself   0  0 0 0   Trouble concentrating  0  0 0 0   Moving slowly or fidgety/restless  0  0 0 0   Suicidal thoughts  0  0 0 0   PHQ-9 Score  0  0 0 0   Difficult doing work/chores  Not difficult at all  Not difficult at all Not difficult at all     Review of Systems  Constitutional: Negative.   HENT: Negative.    Eyes: Negative.   Respiratory: Negative.    Cardiovascular: Negative.   Gastrointestinal: Negative.   Endocrine: Negative.   Genitourinary: Negative.   Musculoskeletal:  Positive for arthralgias, back pain and gait problem.  Skin: Negative.   Allergic/Immunologic: Negative.   Hematological: Negative.   Psychiatric/Behavioral:  Negative.         Objective:   Physical Exam Gen: no distress, normal appearing, BMI 29.85, 152/83 HEENT: oral mucosa pink and moist, NCAT Cardio: Reg rate Chest: normal effort, normal rate of breathing Abd: soft, non-distended Ext: no edema Skin: intact Neuro:Alert and oriented x3 Musculoskeletal: Severely limited range of motion in bilateral arms- only about 30 degrees in bilateral elevation.  Psych: pleasant, normal affect    Assessment & Plan:   1) Chronic bilateral adhesive capsulitis: Tylenol 650mg  morning, lunch, dinner  Prescribing Home Zynex NexWave Stimulator Device and supplies as needed. IFC, NMES and TENS medically necessary Treatment Rx: Daily @ 30-40 minutes per treatment PRN. Zynex NexWave only, no substitutions. Treatment Goals: 1) To reduce and/or eliminate pain 2) To improve functional capacity and Activities of daily living 3) To reduce or prevent the need for oral medications 4) To improve circulation in the injured region 5) To decrease or prevent muscle spasm and muscle atrophy 6) To provide a self-management tool to the patient The patient has not sufficiently improved with conservative care. Numerous studies indexed by Medline and PubMed.gov have shown Neuromuscular, Interferential, and TENS stimulators to reduce pain, improve function, and reduce medication use in injured patients. Continued use of this evidence based, safe, drug free treatment is both reasonable and medically necessary at this time.   Apply diclofenac gel up to 4 times per day (alternatively can try CBD oil or blue emu oil)  Lidocaine patch on both shoulders, reordered -prescribed  daily 3mg  prednisone -home therapy ordered Vitamin D level checked and was normal in 2020  2) Hypomagnesemia  -Magnesium level checked from 2019 and was low at 1.3: will order supplement to take at night  3) Family support: Has wonderful family support. Discussed how touched she is by her children's care.    4) Incontinent stool/constipation: Magnesium will also help to regulate this. Discussed goal of 1-2 BM per day.   5) Polymyalgia rheumatica -prescribed prednisone 1mg  daily prn. Can take up to 5 per day.  -continue CBD oil -Discussed current symptoms of pain and history of pain.  -Discussed benefits of exercise in reducing pain. -Discussed following foods that may reduce pain: 1) Ginger (especially studied for arthritis)- reduce leukotriene production to decrease inflammation 2) Blueberries- high in phytonutrients that decrease inflammation 3) Salmon- marine omega-3s reduce joint swelling and pain 4) Pumpkin seeds- reduce inflammation 5) dark chocolate- reduces inflammation 6) turmeric- reduces inflammation 7) tart cherries - reduce pain and stiffness 8) extra virgin olive oil - its compound olecanthal helps to block prostaglandins  9) chili peppers- can be eaten or applied topically via capsaicin 10) mint- helpful for headache, muscle aches, joint pain, and itching 11) garlic- reduces inflammation  Link to further information on diet for chronic pain: http://www.bray.com/   Turmeric to reduce inflammation--can be used in cooking or taken as a supplement.  Benefits of turmeric:  -Highly anti-inflammatory  -Increases antioxidants  -Improves memory, attention, brain disease  -Lowers risk of heart disease  -May help prevent cancer  -Decreases pain  -Alleviates depression  -Delays aging and decreases risk of chronic disease  -Consume with black pepper to increase absorption    Turmeric Milk Recipe:  1 cup milk  1 tsp turmeric  1 tsp cinnamon  1 tsp grated ginger (optional)  Black pepper (boosts the anti-inflammatory properties of turmeric).  1 tsp honey   6) CKD stage 4: -recommending staying well hydrated.   7) Osteoporosis? -discussed benefits of weight bearing exercise -recommend  yogurt and figs.   8) Tongue drying out/?sleep apnea/snoring -discussed decreasing inflammatory carbohydrates in the diet -avoid mouth wash  9) Bilateral hip pain: -Prescribing Home Zynex NexWave Stimulator Device and supplies as needed. IFC, NMES and TENS medically necessary Treatment Rx: Daily @ 30-40 minutes per treatment PRN. Zynex NexWave only, no substitutions. Treatment Goals: 1) To reduce and/or eliminate pain 2) To improve functional capacity and Activities of daily living 3) To reduce or prevent the need for oral medications 4) To improve circulation in the injured region 5) To decrease or prevent muscle spasm and muscle atrophy 6) To provide a self-management tool to the patient The patient has not sufficiently improved with conservative care. Numerous studies indexed by Medline and PubMed.gov have shown Neuromuscular, Interferential, and TENS stimulators to reduce pain, improve function, and reduce medication use in injured patients. Continued use of this evidence based, safe, drug free treatment is both reasonable and medically necessary at this time.   -discussed SI belt  10) Overweight:  -discussed that weight is improved  11) HTN: -BP is 152/83 today.  -Advised checking BP daily at home and logging results to bring into follow-up appointment with PCP and myself. -Reviewed BP meds today.  -Advised regarding healthy foods that can help lower blood pressure and provided with a list: 1) citrus foods- high in vitamins and minerals 2) salmon and other fatty fish - reduces inflammation and oxylipins 3) swiss chard (leafy green)- high level of nitrates 4) pumpkin seeds- one  of the best natural sources of magnesium 5) Beans and lentils- high in fiber, magnesium, and potassium 6) Berries- high in flavonoids 7) Amaranth (whole grain, can be cooked similarly to rice and oats)- high in magnesium and fiber 8) Pistachios- even more effective at reducing BP than other nuts 9) Carrots-  high in phenolic compounds that relax blood vessels and reduce inflammation 10) Celery- contain phthalides that relax tissues of arterial walls 11) Tomatoes- can also improve cholesterol and reduce risk of heart disease 12) Broccoli- good source of magnesium, calcium, and potassium 13) Greek yogurt: high in potassium and calcium 14) Herbs and spices: Celery seed, cilantro, saffron, lemongrass, black cumin, ginseng, cinnamon, cardamom, sweet basil, and ginger 15) Chia and flax seeds- also help to lower cholesterol and blood sugar 16) Beets- high levels of nitrates that relax blood vessels  17) spinach and bananas- high in potassium  -Provided lise of supplements that can help with hypertension:  1) magnesium: one high quality brand is Bioptemizers since it contains all 7 types of magnesium, otherwise over the counter magnesium gluconate 400mg  is a good option 2) B vitamins 3) vitamin D 4) potassium 5) CoQ10 6) L-arginine 7) Vitamin C 8) Beetroot -Educated that goal BP is 120/80. -Made goal to incorporate some of the above foods into diet.

## 2023-06-09 DIAGNOSIS — M19011 Primary osteoarthritis, right shoulder: Secondary | ICD-10-CM | POA: Diagnosis not present

## 2023-06-09 DIAGNOSIS — Z7952 Long term (current) use of systemic steroids: Secondary | ICD-10-CM | POA: Diagnosis not present

## 2023-06-09 DIAGNOSIS — E559 Vitamin D deficiency, unspecified: Secondary | ICD-10-CM | POA: Diagnosis not present

## 2023-06-09 DIAGNOSIS — M47816 Spondylosis without myelopathy or radiculopathy, lumbar region: Secondary | ICD-10-CM | POA: Diagnosis not present

## 2023-06-09 DIAGNOSIS — R5383 Other fatigue: Secondary | ICD-10-CM | POA: Diagnosis not present

## 2023-06-09 DIAGNOSIS — E785 Hyperlipidemia, unspecified: Secondary | ICD-10-CM | POA: Diagnosis not present

## 2023-06-09 DIAGNOSIS — J302 Other seasonal allergic rhinitis: Secondary | ICD-10-CM | POA: Diagnosis not present

## 2023-06-09 DIAGNOSIS — Z7984 Long term (current) use of oral hypoglycemic drugs: Secondary | ICD-10-CM | POA: Diagnosis not present

## 2023-06-09 DIAGNOSIS — Z7982 Long term (current) use of aspirin: Secondary | ICD-10-CM | POA: Diagnosis not present

## 2023-06-09 DIAGNOSIS — I509 Heart failure, unspecified: Secondary | ICD-10-CM | POA: Diagnosis not present

## 2023-06-09 DIAGNOSIS — M109 Gout, unspecified: Secondary | ICD-10-CM | POA: Diagnosis not present

## 2023-06-09 DIAGNOSIS — I130 Hypertensive heart and chronic kidney disease with heart failure and stage 1 through stage 4 chronic kidney disease, or unspecified chronic kidney disease: Secondary | ICD-10-CM | POA: Diagnosis not present

## 2023-06-09 DIAGNOSIS — M129 Arthropathy, unspecified: Secondary | ICD-10-CM | POA: Diagnosis not present

## 2023-06-09 DIAGNOSIS — Z79899 Other long term (current) drug therapy: Secondary | ICD-10-CM | POA: Diagnosis not present

## 2023-06-09 DIAGNOSIS — K219 Gastro-esophageal reflux disease without esophagitis: Secondary | ICD-10-CM | POA: Diagnosis not present

## 2023-06-09 DIAGNOSIS — M19012 Primary osteoarthritis, left shoulder: Secondary | ICD-10-CM | POA: Diagnosis not present

## 2023-06-09 DIAGNOSIS — G8929 Other chronic pain: Secondary | ICD-10-CM | POA: Diagnosis not present

## 2023-06-09 DIAGNOSIS — R7303 Prediabetes: Secondary | ICD-10-CM | POA: Diagnosis not present

## 2023-06-09 DIAGNOSIS — N184 Chronic kidney disease, stage 4 (severe): Secondary | ICD-10-CM | POA: Diagnosis not present

## 2023-06-11 DIAGNOSIS — Z7952 Long term (current) use of systemic steroids: Secondary | ICD-10-CM | POA: Diagnosis not present

## 2023-06-11 DIAGNOSIS — M129 Arthropathy, unspecified: Secondary | ICD-10-CM | POA: Diagnosis not present

## 2023-06-11 DIAGNOSIS — I509 Heart failure, unspecified: Secondary | ICD-10-CM | POA: Diagnosis not present

## 2023-06-11 DIAGNOSIS — Z7984 Long term (current) use of oral hypoglycemic drugs: Secondary | ICD-10-CM | POA: Diagnosis not present

## 2023-06-11 DIAGNOSIS — G8929 Other chronic pain: Secondary | ICD-10-CM | POA: Diagnosis not present

## 2023-06-11 DIAGNOSIS — Z7982 Long term (current) use of aspirin: Secondary | ICD-10-CM | POA: Diagnosis not present

## 2023-06-11 DIAGNOSIS — I130 Hypertensive heart and chronic kidney disease with heart failure and stage 1 through stage 4 chronic kidney disease, or unspecified chronic kidney disease: Secondary | ICD-10-CM | POA: Diagnosis not present

## 2023-06-11 DIAGNOSIS — M19011 Primary osteoarthritis, right shoulder: Secondary | ICD-10-CM | POA: Diagnosis not present

## 2023-06-11 DIAGNOSIS — R7303 Prediabetes: Secondary | ICD-10-CM | POA: Diagnosis not present

## 2023-06-11 DIAGNOSIS — M47816 Spondylosis without myelopathy or radiculopathy, lumbar region: Secondary | ICD-10-CM | POA: Diagnosis not present

## 2023-06-11 DIAGNOSIS — N184 Chronic kidney disease, stage 4 (severe): Secondary | ICD-10-CM | POA: Diagnosis not present

## 2023-06-11 DIAGNOSIS — E559 Vitamin D deficiency, unspecified: Secondary | ICD-10-CM | POA: Diagnosis not present

## 2023-06-11 DIAGNOSIS — E785 Hyperlipidemia, unspecified: Secondary | ICD-10-CM | POA: Diagnosis not present

## 2023-06-11 DIAGNOSIS — R5383 Other fatigue: Secondary | ICD-10-CM | POA: Diagnosis not present

## 2023-06-11 DIAGNOSIS — M109 Gout, unspecified: Secondary | ICD-10-CM | POA: Diagnosis not present

## 2023-06-11 DIAGNOSIS — Z79899 Other long term (current) drug therapy: Secondary | ICD-10-CM | POA: Diagnosis not present

## 2023-06-11 DIAGNOSIS — J302 Other seasonal allergic rhinitis: Secondary | ICD-10-CM | POA: Diagnosis not present

## 2023-06-11 DIAGNOSIS — K219 Gastro-esophageal reflux disease without esophagitis: Secondary | ICD-10-CM | POA: Diagnosis not present

## 2023-06-11 DIAGNOSIS — M19012 Primary osteoarthritis, left shoulder: Secondary | ICD-10-CM | POA: Diagnosis not present

## 2023-06-14 DIAGNOSIS — G8929 Other chronic pain: Secondary | ICD-10-CM | POA: Diagnosis not present

## 2023-06-14 DIAGNOSIS — E559 Vitamin D deficiency, unspecified: Secondary | ICD-10-CM | POA: Diagnosis not present

## 2023-06-14 DIAGNOSIS — K219 Gastro-esophageal reflux disease without esophagitis: Secondary | ICD-10-CM | POA: Diagnosis not present

## 2023-06-14 DIAGNOSIS — M19011 Primary osteoarthritis, right shoulder: Secondary | ICD-10-CM | POA: Diagnosis not present

## 2023-06-14 DIAGNOSIS — E785 Hyperlipidemia, unspecified: Secondary | ICD-10-CM | POA: Diagnosis not present

## 2023-06-14 DIAGNOSIS — M19012 Primary osteoarthritis, left shoulder: Secondary | ICD-10-CM | POA: Diagnosis not present

## 2023-06-14 DIAGNOSIS — I509 Heart failure, unspecified: Secondary | ICD-10-CM | POA: Diagnosis not present

## 2023-06-14 DIAGNOSIS — M129 Arthropathy, unspecified: Secondary | ICD-10-CM | POA: Diagnosis not present

## 2023-06-14 DIAGNOSIS — R5383 Other fatigue: Secondary | ICD-10-CM | POA: Diagnosis not present

## 2023-06-14 DIAGNOSIS — N184 Chronic kidney disease, stage 4 (severe): Secondary | ICD-10-CM | POA: Diagnosis not present

## 2023-06-14 DIAGNOSIS — I130 Hypertensive heart and chronic kidney disease with heart failure and stage 1 through stage 4 chronic kidney disease, or unspecified chronic kidney disease: Secondary | ICD-10-CM | POA: Diagnosis not present

## 2023-06-14 DIAGNOSIS — M109 Gout, unspecified: Secondary | ICD-10-CM | POA: Diagnosis not present

## 2023-06-14 DIAGNOSIS — J302 Other seasonal allergic rhinitis: Secondary | ICD-10-CM | POA: Diagnosis not present

## 2023-06-14 DIAGNOSIS — Z7982 Long term (current) use of aspirin: Secondary | ICD-10-CM | POA: Diagnosis not present

## 2023-06-14 DIAGNOSIS — Z7984 Long term (current) use of oral hypoglycemic drugs: Secondary | ICD-10-CM | POA: Diagnosis not present

## 2023-06-14 DIAGNOSIS — Z7952 Long term (current) use of systemic steroids: Secondary | ICD-10-CM | POA: Diagnosis not present

## 2023-06-14 DIAGNOSIS — Z79899 Other long term (current) drug therapy: Secondary | ICD-10-CM | POA: Diagnosis not present

## 2023-06-14 DIAGNOSIS — R7303 Prediabetes: Secondary | ICD-10-CM | POA: Diagnosis not present

## 2023-06-14 DIAGNOSIS — M47816 Spondylosis without myelopathy or radiculopathy, lumbar region: Secondary | ICD-10-CM | POA: Diagnosis not present

## 2023-06-15 DIAGNOSIS — N184 Chronic kidney disease, stage 4 (severe): Secondary | ICD-10-CM | POA: Diagnosis not present

## 2023-06-15 DIAGNOSIS — M47816 Spondylosis without myelopathy or radiculopathy, lumbar region: Secondary | ICD-10-CM | POA: Diagnosis not present

## 2023-06-15 DIAGNOSIS — M19012 Primary osteoarthritis, left shoulder: Secondary | ICD-10-CM | POA: Diagnosis not present

## 2023-06-15 DIAGNOSIS — M109 Gout, unspecified: Secondary | ICD-10-CM | POA: Diagnosis not present

## 2023-06-15 DIAGNOSIS — E559 Vitamin D deficiency, unspecified: Secondary | ICD-10-CM | POA: Diagnosis not present

## 2023-06-15 DIAGNOSIS — R0602 Shortness of breath: Secondary | ICD-10-CM | POA: Diagnosis not present

## 2023-06-15 DIAGNOSIS — E7800 Pure hypercholesterolemia, unspecified: Secondary | ICD-10-CM | POA: Diagnosis not present

## 2023-06-15 DIAGNOSIS — M25511 Pain in right shoulder: Secondary | ICD-10-CM | POA: Diagnosis not present

## 2023-06-15 DIAGNOSIS — M19011 Primary osteoarthritis, right shoulder: Secondary | ICD-10-CM | POA: Diagnosis not present

## 2023-06-15 DIAGNOSIS — J302 Other seasonal allergic rhinitis: Secondary | ICD-10-CM | POA: Diagnosis not present

## 2023-06-15 DIAGNOSIS — M25512 Pain in left shoulder: Secondary | ICD-10-CM | POA: Diagnosis not present

## 2023-06-15 DIAGNOSIS — R5383 Other fatigue: Secondary | ICD-10-CM | POA: Diagnosis not present

## 2023-06-15 DIAGNOSIS — K219 Gastro-esophageal reflux disease without esophagitis: Secondary | ICD-10-CM | POA: Diagnosis not present

## 2023-06-15 DIAGNOSIS — I509 Heart failure, unspecified: Secondary | ICD-10-CM | POA: Diagnosis not present

## 2023-06-17 DIAGNOSIS — Z79899 Other long term (current) drug therapy: Secondary | ICD-10-CM | POA: Diagnosis not present

## 2023-06-17 DIAGNOSIS — R7303 Prediabetes: Secondary | ICD-10-CM | POA: Diagnosis not present

## 2023-06-17 DIAGNOSIS — Z7984 Long term (current) use of oral hypoglycemic drugs: Secondary | ICD-10-CM | POA: Diagnosis not present

## 2023-06-17 DIAGNOSIS — Z7982 Long term (current) use of aspirin: Secondary | ICD-10-CM | POA: Diagnosis not present

## 2023-06-17 DIAGNOSIS — M19012 Primary osteoarthritis, left shoulder: Secondary | ICD-10-CM | POA: Diagnosis not present

## 2023-06-17 DIAGNOSIS — E559 Vitamin D deficiency, unspecified: Secondary | ICD-10-CM | POA: Diagnosis not present

## 2023-06-17 DIAGNOSIS — E785 Hyperlipidemia, unspecified: Secondary | ICD-10-CM | POA: Diagnosis not present

## 2023-06-17 DIAGNOSIS — M19011 Primary osteoarthritis, right shoulder: Secondary | ICD-10-CM | POA: Diagnosis not present

## 2023-06-17 DIAGNOSIS — N184 Chronic kidney disease, stage 4 (severe): Secondary | ICD-10-CM | POA: Diagnosis not present

## 2023-06-17 DIAGNOSIS — M109 Gout, unspecified: Secondary | ICD-10-CM | POA: Diagnosis not present

## 2023-06-17 DIAGNOSIS — I509 Heart failure, unspecified: Secondary | ICD-10-CM | POA: Diagnosis not present

## 2023-06-17 DIAGNOSIS — Z7952 Long term (current) use of systemic steroids: Secondary | ICD-10-CM | POA: Diagnosis not present

## 2023-06-17 DIAGNOSIS — J302 Other seasonal allergic rhinitis: Secondary | ICD-10-CM | POA: Diagnosis not present

## 2023-06-17 DIAGNOSIS — R5383 Other fatigue: Secondary | ICD-10-CM | POA: Diagnosis not present

## 2023-06-17 DIAGNOSIS — G8929 Other chronic pain: Secondary | ICD-10-CM | POA: Diagnosis not present

## 2023-06-17 DIAGNOSIS — M47816 Spondylosis without myelopathy or radiculopathy, lumbar region: Secondary | ICD-10-CM | POA: Diagnosis not present

## 2023-06-17 DIAGNOSIS — M129 Arthropathy, unspecified: Secondary | ICD-10-CM | POA: Diagnosis not present

## 2023-06-17 DIAGNOSIS — I130 Hypertensive heart and chronic kidney disease with heart failure and stage 1 through stage 4 chronic kidney disease, or unspecified chronic kidney disease: Secondary | ICD-10-CM | POA: Diagnosis not present

## 2023-06-17 DIAGNOSIS — K219 Gastro-esophageal reflux disease without esophagitis: Secondary | ICD-10-CM | POA: Diagnosis not present

## 2023-06-18 DIAGNOSIS — K219 Gastro-esophageal reflux disease without esophagitis: Secondary | ICD-10-CM | POA: Diagnosis not present

## 2023-06-18 DIAGNOSIS — Z7952 Long term (current) use of systemic steroids: Secondary | ICD-10-CM | POA: Diagnosis not present

## 2023-06-18 DIAGNOSIS — I130 Hypertensive heart and chronic kidney disease with heart failure and stage 1 through stage 4 chronic kidney disease, or unspecified chronic kidney disease: Secondary | ICD-10-CM | POA: Diagnosis not present

## 2023-06-18 DIAGNOSIS — Z7982 Long term (current) use of aspirin: Secondary | ICD-10-CM | POA: Diagnosis not present

## 2023-06-18 DIAGNOSIS — M19012 Primary osteoarthritis, left shoulder: Secondary | ICD-10-CM | POA: Diagnosis not present

## 2023-06-18 DIAGNOSIS — N184 Chronic kidney disease, stage 4 (severe): Secondary | ICD-10-CM | POA: Diagnosis not present

## 2023-06-18 DIAGNOSIS — J302 Other seasonal allergic rhinitis: Secondary | ICD-10-CM | POA: Diagnosis not present

## 2023-06-18 DIAGNOSIS — I509 Heart failure, unspecified: Secondary | ICD-10-CM | POA: Diagnosis not present

## 2023-06-18 DIAGNOSIS — M129 Arthropathy, unspecified: Secondary | ICD-10-CM | POA: Diagnosis not present

## 2023-06-18 DIAGNOSIS — M109 Gout, unspecified: Secondary | ICD-10-CM | POA: Diagnosis not present

## 2023-06-18 DIAGNOSIS — R5383 Other fatigue: Secondary | ICD-10-CM | POA: Diagnosis not present

## 2023-06-18 DIAGNOSIS — R7303 Prediabetes: Secondary | ICD-10-CM | POA: Diagnosis not present

## 2023-06-18 DIAGNOSIS — M19011 Primary osteoarthritis, right shoulder: Secondary | ICD-10-CM | POA: Diagnosis not present

## 2023-06-18 DIAGNOSIS — Z79899 Other long term (current) drug therapy: Secondary | ICD-10-CM | POA: Diagnosis not present

## 2023-06-18 DIAGNOSIS — Z7984 Long term (current) use of oral hypoglycemic drugs: Secondary | ICD-10-CM | POA: Diagnosis not present

## 2023-06-18 DIAGNOSIS — E785 Hyperlipidemia, unspecified: Secondary | ICD-10-CM | POA: Diagnosis not present

## 2023-06-18 DIAGNOSIS — E559 Vitamin D deficiency, unspecified: Secondary | ICD-10-CM | POA: Diagnosis not present

## 2023-06-18 DIAGNOSIS — M47816 Spondylosis without myelopathy or radiculopathy, lumbar region: Secondary | ICD-10-CM | POA: Diagnosis not present

## 2023-06-18 DIAGNOSIS — G8929 Other chronic pain: Secondary | ICD-10-CM | POA: Diagnosis not present

## 2023-06-23 ENCOUNTER — Ambulatory Visit (INDEPENDENT_AMBULATORY_CARE_PROVIDER_SITE_OTHER): Payer: Medicare HMO

## 2023-06-23 VITALS — Ht 61.0 in | Wt 158.0 lb

## 2023-06-23 DIAGNOSIS — Z0000 Encounter for general adult medical examination without abnormal findings: Secondary | ICD-10-CM | POA: Diagnosis not present

## 2023-06-23 DIAGNOSIS — Z7952 Long term (current) use of systemic steroids: Secondary | ICD-10-CM | POA: Diagnosis not present

## 2023-06-23 DIAGNOSIS — M47816 Spondylosis without myelopathy or radiculopathy, lumbar region: Secondary | ICD-10-CM | POA: Diagnosis not present

## 2023-06-23 DIAGNOSIS — K219 Gastro-esophageal reflux disease without esophagitis: Secondary | ICD-10-CM | POA: Diagnosis not present

## 2023-06-23 DIAGNOSIS — M129 Arthropathy, unspecified: Secondary | ICD-10-CM | POA: Diagnosis not present

## 2023-06-23 DIAGNOSIS — M19012 Primary osteoarthritis, left shoulder: Secondary | ICD-10-CM | POA: Diagnosis not present

## 2023-06-23 DIAGNOSIS — M19011 Primary osteoarthritis, right shoulder: Secondary | ICD-10-CM | POA: Diagnosis not present

## 2023-06-23 DIAGNOSIS — Z7982 Long term (current) use of aspirin: Secondary | ICD-10-CM | POA: Diagnosis not present

## 2023-06-23 DIAGNOSIS — R5383 Other fatigue: Secondary | ICD-10-CM | POA: Diagnosis not present

## 2023-06-23 DIAGNOSIS — G8929 Other chronic pain: Secondary | ICD-10-CM | POA: Diagnosis not present

## 2023-06-23 DIAGNOSIS — I130 Hypertensive heart and chronic kidney disease with heart failure and stage 1 through stage 4 chronic kidney disease, or unspecified chronic kidney disease: Secondary | ICD-10-CM | POA: Diagnosis not present

## 2023-06-23 DIAGNOSIS — N184 Chronic kidney disease, stage 4 (severe): Secondary | ICD-10-CM | POA: Diagnosis not present

## 2023-06-23 DIAGNOSIS — M109 Gout, unspecified: Secondary | ICD-10-CM | POA: Diagnosis not present

## 2023-06-23 DIAGNOSIS — Z79899 Other long term (current) drug therapy: Secondary | ICD-10-CM | POA: Diagnosis not present

## 2023-06-23 DIAGNOSIS — R7303 Prediabetes: Secondary | ICD-10-CM | POA: Diagnosis not present

## 2023-06-23 DIAGNOSIS — Z7984 Long term (current) use of oral hypoglycemic drugs: Secondary | ICD-10-CM | POA: Diagnosis not present

## 2023-06-23 DIAGNOSIS — E559 Vitamin D deficiency, unspecified: Secondary | ICD-10-CM | POA: Diagnosis not present

## 2023-06-23 DIAGNOSIS — I509 Heart failure, unspecified: Secondary | ICD-10-CM | POA: Diagnosis not present

## 2023-06-23 DIAGNOSIS — E785 Hyperlipidemia, unspecified: Secondary | ICD-10-CM | POA: Diagnosis not present

## 2023-06-23 DIAGNOSIS — J302 Other seasonal allergic rhinitis: Secondary | ICD-10-CM | POA: Diagnosis not present

## 2023-06-23 NOTE — Progress Notes (Addendum)
Subjective:   [name] is a 87 y.o. female who presents for Medicare Annual (Subsequent) preventive examination.  Visit Complete: Virtual  I connected with  [name] on 06/23/23 by a audio enabled telemedicine application and verified that I am speaking with the correct person using two identifiers.  Patient Location: Home  Provider Location: Office/Clinic  I discussed the limitations of evaluation and management by telemedicine. The patient expressed understanding and agreed to proceed.  Vital Signs: Because this visit was a virtual/telehealth visit, some criteria may be missing or patient reported. Any vitals not documented were not able to be obtained and vitals that have been documented are patient reported.    Review of Systems     Cardiac Risk Factors include: advanced age (>59men, >14 women);dyslipidemia;hypertension     Objective:    Today's Vitals   06/23/23 1332  Weight: 158 lb (71.7 kg)  Height: 5\' 1"  (1.549 m)  PainSc: 0-No pain   Body mass index is 29.85 kg/m.     06/23/2023    1:40 PM 06/08/2022    4:04 PM 04/23/2022   10:08 AM 07/28/2021    9:05 AM 07/27/2021    4:27 PM 05/20/2021    9:33 AM 02/28/2018    3:31 PM  Advanced Directives  Does Patient Have a Medical Advance Directive? Yes Yes Yes Yes Yes Yes Yes  Type of Estate agent of [place];Living will Healthcare Power of [place];Living will Living will Healthcare Power of [place];Living will Healthcare Power of [place];Living will Living will;Healthcare Power of Attorney Living will  Does patient want to make changes to medical advance directive?  No - Patient declined  No - Patient declined  No - Patient declined   Copy of Healthcare Power of Attorney in Chart? No - copy requested No - copy requested  No - copy requested No - copy requested No - copy requested     Current Medications (verified) Outpatient Encounter Medications as of 06/23/2023  Medication  Sig   albuterol (VENTOLIN HFA) 108 (90 Base) MCG/ACT inhaler Inhale 1 puff into the lungs every 6 (six) hours as needed for wheezing or shortness of breath.   allopurinol (ZYLOPRIM) 100 MG tablet Take 100 mg by mouth daily.   amLODipine (NORVASC) 5 MG tablet TAKE 1 TABLET BY MOUTH EVERY DAY (Patient taking differently: Take 5 mg by mouth every morning.)   aspirin EC 81 MG tablet Take 81 mg by mouth every morning. Swallow whole.   celecoxib (CELEBREX) 100 MG capsule Take 100 mg by mouth 2 (two) times daily. (Patient not taking: Reported on 03/17/2023)   Cholecalciferol (VITAMIN D3) 20 MCG (800 UNIT) TABS Take 1 tablet by mouth daily.   dapagliflozin propanediol (FARXIGA) 10 MG TABS tablet Take 1 tablet (10 mg total) by mouth daily before breakfast.   diclofenac Sodium (VOLTAREN) 1 % GEL Inhale 1 puff into the lungs 2 (two) times daily.   diclofenac Sodium (VOLTAREN) 1 % GEL APPLY 2 GRAMS TO AFFECTED AREA 4 TIMES A DAY   Ensure (ENSURE) Take 1 Can by mouth 3 (three) times daily between meals. (Patient taking differently: Take 237 mLs by mouth See admin instructions. Drink one can (237 mls) by mouth once or twice daily)   febuxostat (ULORIC) 40 MG tablet Take 40 mg by mouth daily.   FLUAD QUADRIVALENT 0.5 ML injection    fluticasone-salmeterol (WIXELA INHUB) 250-50 MCG/ACT AEPB Inhale 1 puff into the lungs in the morning and at bedtime.  furosemide (LASIX) 40 MG tablet Take 1 tablet (40 mg total) by mouth every Monday, Wednesday, and Friday.   gabapentin (NEURONTIN) 300 MG capsule TAKE 1 CAPSULE BY MOUTH THREE TIMES A DAY   Incontinence Supply Disposable (DEPEND UNDERWEAR SM/MED) MISC Use as directed four times per day   lidocaine (LIDODERM) 5 % Place 1 patch onto the skin daily. Remove & Discard patch within 12 hours or as directed by MD   losartan (COZAAR) 100 MG tablet Take 100 mg by mouth daily.   magnesium gluconate (MAGONATE) 500 MG tablet Take 1 tablet (500 mg total) by mouth 2 (two) times  daily.   meclizine (ANTIVERT) 12.5 MG tablet TAKE 1 TABLET BY MOUTH THREE TIMES A DAY AS NEEDED FOR DIZZINESS (Patient taking differently: Take 12.5 mg by mouth 3 (three) times daily as needed for dizziness.)   metoprolol succinate (TOPROL-XL) 25 MG 24 hr tablet Take 1 tablet (25 mg total) by mouth daily.   montelukast (SINGULAIR) 10 MG tablet TAKE 1 TABLET BY MOUTH EVERY DAY   Multiple Vitamin (MULTIVITAMIN WITH MINERALS) TABS tablet Take 1 tablet by mouth daily. Centrum Silver   pantoprazole (PROTONIX) 40 MG tablet TAKE 1 TABLET BY MOUTH EVERY DAY   potassium chloride (KLOR-CON) 10 MEQ tablet TAKE 1 TABLET BY MOUTH EVERY DAY WHEN TAKING FUROSEMIDE (Patient taking differently: Take 10 mEq by mouth every Monday, Wednesday, and Friday.)   predniSONE (DELTASONE) 5 MG tablet Take 1 tablet (5 mg total) by mouth daily with breakfast.   SPIKEVAX syringe    triamcinolone cream (KENALOG) 0.1 % APPLY TO AFFECTED AREA TWICE A DAY   No facility-administered encounter medications on file as of 06/23/2023.    Allergies (verified) Hydrocodone, Lasix [furosemide], Tizanidine, and Iron   History: Past Medical History:  Diagnosis Date   Allergic rhinitis 10/30/2016   Anemia    Asthma    Breast cancer of upper-outer quadrant of left female breast (HCC) 11/08/2013   ER/PR+ Her2- Left IDC    Chronic renal insufficiency    Chronic rhinitis    Colon polyp    Diastolic dysfunction 07/10/2016   DJD (degenerative joint disease)    Dyspnea    Frozen shoulder    Full dentures    GERD (gastroesophageal reflux disease)    Hearing loss    Hypertension    Hyponatremia    Impaired glucose tolerance 07/18/2014   Memory loss    Morbid obesity (HCC)    Poor circulation    Vertigo    Wears glasses    Past Surgical History:  Procedure Laterality Date   ABDOMINAL HYSTERECTOMY     BREAST LUMPECTOMY WITH NEEDLE LOCALIZATION AND AXILLARY SENTINEL LYMPH NODE BX Left 12/04/2013   Procedure: BREAST LUMPECTOMY WITH  NEEDLE LOCALIZATION AND AXILLARY SENTINEL LYMPH NODE BX;  Surgeon: Kandis Cocking, MD;  Location: [facility];  Service: General;  Laterality: Left;   CATARACT EXTRACTION  2009   rt   COLONOSCOPY     EYE SURGERY Bilateral    cataract surgery   KNEE ARTHROSCOPY     both   LUMBAR LAMINECTOMY/DECOMPRESSION MICRODISCECTOMY Left 12/23/2017   Procedure: Left Lumbar One-Two Laminectomy with microdiscectomy;  Surgeon: Tia Alert, MD;  Location[facility];  Service: Neurosurgery;  Laterality: Left;  Left L1-2 Laminectomy with microdiscectomy   TONSILLECTOMY     TOTAL KNEE ARTHROPLASTY  2002   rt   TOTAL KNEE ARTHROPLASTY  2003   left   VESICOVAGINAL FISTULA CLOSURE W/ TAH  1980   Family History  Problem Relation Age of Onset   Colon cancer Mother        in her 82's   Stomach cancer Mother    Lung cancer Brother        was a smoker   Heart attack Son 107   Healthy Daughter    Social History   Socioeconomic History   Marital status: Widowed    Spouse name: Not on file   Number of children: 2   Years of education: Not on file   Highest education level: Not on file  Occupational History   Occupation: owns bakery and works PT for [employer]  Tobacco Use   Smoking status: Never    Passive exposure: Never   Smokeless tobacco: Never  Vaping Use   Vaping status: Never Used  Substance and Sexual Activity   Alcohol use: No    Alcohol/week: 0.0 standard drinks of alcohol   Drug use: No   Sexual activity: Not Currently  Other Topics Concern   Not on file  Social History Narrative   Not on file   Social Determinants of Health   Financial Resource Strain: Low Risk  (06/23/2023)   Overall Financial Resource Strain (CARDIA)    Difficulty of Paying Living Expenses: Not hard at all  Food Insecurity: No Food Insecurity (06/23/2023)   Hunger Vital Sign    Worried About Running Out of Food in the Last Year: Never true    Ran Out of Food in the Last Year: Never true   Transportation Needs: No Transportation Needs (06/23/2023)   PRAPARE - Administrator, Civil Service (Medical): No    Lack of Transportation (Non-Medical): No  Physical Activity: Insufficiently Active (06/23/2023)   Exercise Vital Sign    Days of Exercise per Week: 4 days    Minutes of Exercise per Session: 20 min  Stress: No Stress Concern Present (06/23/2023)   [employer] of Occupational Health - Occupational Stress Questionnaire    Feeling of Stress : Not at all  Social Connections: Moderately Integrated (06/23/2023)   Social Connection and Isolation Panel [NHANES]    Frequency of Communication with Friends and Family: More than three times a week    Frequency of Social Gatherings with Friends and Family: More than three times a week    Attends Religious Services: More than 4 times per year    Active Member of [employer] or Organizations: Yes    Attends Banker Meetings: More than 4 times per year    Marital Status: Widowed    Tobacco Counseling Counseling given: Not Answered   Clinical Intake:  Pre-visit preparation completed: Yes  Pain : No/denies pain Pain Score: 0-No pain     BMI - recorded: 29.85 Nutritional Status: BMI 25 -29 Overweight Nutritional Risks: None Diabetes: No  How often do you need to have someone help you when you read instructions, pamphlets, or other written materials from your doctor or pharmacy?: 1 - Never What is the last grade level you completed in school?: HSG  Interpreter Needed?: No  Information entered by :: Armon Orvis N. Viola Kinnick, LPN.   Activities of Daily Living    06/23/2023    1:43 PM  In your present state of health, do you have any difficulty performing the following activities:  Hearing? 0  Vision? 0  Difficulty concentrating or making decisions? 1  Walking or climbing stairs? 0  Dressing or bathing? 0  Doing errands, shopping?  0  Preparing Food and eating ? N  Using the Toilet? N  In the past  six months, have you accidently leaked urine? Y  Do you have problems with loss of bowel control? Y  Managing your Medications? N  Managing your Finances? N  Housekeeping or managing your Housekeeping? N    Patient Care Team: Corwin Levins, MD as PCP - General (Internal Medicine) Magrinat, Valentino Hue, MD (Inactive) as Consulting Physician (Oncology) Lurline Hare, MD as Consulting Physician (Radiation Oncology) Richardean Chimera, MD as Consulting Physician (Obstetrics and Gynecology) Maryagnes Amos, FNP as Nurse Practitioner (Psychiatry) Althea Charon, MD as Consulting Physician (Radiology) [facility] as Consulting Physician (Ophthalmology)  Indicate any recent Medical Services you may have received from other than [facility] providers in the past year (date may be approximate).     Assessment:   This is a routine wellness examination for [name].  Hearing/Vision screen Hearing Screening - Comments:: Patient has hearing difficulty and wears hearing aids. Vision Screening - Comments:: Patient does wear corrective lenses/contacts.  Annual eye exam done by: [facility]   Dietary issues and exercise activities discussed:     Goals Addressed             This Visit's Progress    Client understands the importance of follow-up with providers by attending scheduled visits        Depression Screen    06/23/2023    1:41 PM 03/17/2023    3:56 PM 12/15/2022    2:53 PM 12/07/2022   10:05 AM 09/29/2022    8:50 AM 05/29/2022   11:44 AM 04/30/2022    2:59 PM  PHQ 2/9 Scores  PHQ - 2 Score 0 0 0 0 0 0 0  PHQ- 9 Score 0  0  0 0 0    Fall Risk    06/23/2023    1:41 PM 06/08/2023    2:35 PM 03/17/2023    3:56 PM 12/15/2022    2:53 PM 12/07/2022   10:05 AM  Fall Risk   Falls in the past year? 0 0 0 0 0  Number falls in past yr: 0  0 0 0  Injury with Fall? 0  0 0 0  Risk for fall due to : No Fall Risks  No Fall Risks No Fall Risks   Follow up Falls  prevention discussed  Falls evaluation completed Falls evaluation completed     MEDICARE RISK AT HOME: Medicare Risk at Home Any stairs in or around the home?: No If so, are there any without handrails?: No Home free of loose throw rugs in walkways, pet beds, electrical cords, etc?: Yes Adequate lighting in your home to reduce risk of falls?: Yes Life alert?: Yes Use of a cane, walker or w/c?: Yes Grab bars in the bathroom?: No Shower chair or bench in shower?: Yes Elevated toilet seat or a handicapped toilet?: Yes  TIMED UP AND GO:  Was the test performed?  No    Cognitive Function:    08/17/2017    8:54 AM  MMSE - Mini Mental State Exam  Orientation to time 5  Orientation to Place 5  Registration 3  Attention/ Calculation 3  Recall 2  Language- name 2 objects 2  Language- repeat 1  Language- follow 3 step command 3  Language- read & follow direction 1  Write a sentence 1  Copy design 1  Total score 27  06/23/2023    1:46 PM 05/29/2022   11:45 AM  6CIT Screen  What Year? 0 points 0 points  What month? 0 points 0 points  What time? 0 points 0 points  Count back from 20 0 points 0 points  Months in reverse 0 points 0 points  Repeat phrase 0 points 0 points  Total Score 0 points 0 points    Immunizations Immunization History  Administered Date(s) Administered   Fluad Quad(high Dose 65+) 11/27/2020, 06/27/2021, 06/30/2022   Influenza Split 08/07/2011, 10/13/2012   Influenza Whole 07/27/2008, 08/14/2009, 07/01/2010   Influenza, High Dose Seasonal PF 07/10/2016, 08/10/2017, 08/30/2018, 09/08/2019   Influenza,inj,Quad PF,6+ Mos 08/28/2013, 07/18/2014, 07/26/2015   PFIZER(Purple Top)SARS-COV-2 Vaccination 12/11/2019, 01/02/2020   Pfizer Covid-19 Vaccine Bivalent Booster 30yrs & up 11/03/2021   Pneumococcal Conjugate-13 02/20/2014   Pneumococcal Polysaccharide-23 10/26/2005   Tdap 11/22/2017   Unspecified SARS-COV-2 Vaccination 08/03/2022    TDAP status:  Up to date  Flu Vaccine status: Up to date  Pneumococcal vaccine status: Up to date  Covid-19 vaccine status: Completed vaccines  Qualifies for Shingles Vaccine? Yes   Zostavax completed No   Shingrix Completed?: No.    Education has been provided regarding the importance of this vaccine. Patient has been advised to call insurance company to determine out of pocket expense if they have not yet received this vaccine. Advised may also receive vaccine at local pharmacy or Health Dept. Verbalized acceptance and understanding.  Screening Tests Health Maintenance  Topic Date Due   Zoster Vaccines- Shingrix (1 of 2) Never done   INFLUENZA VACCINE  05/27/2023   Medicare Annual Wellness (AWV)  06/22/2024   DTaP/Tdap/Td (2 - Td or Tdap) 11/23/2027   Pneumonia Vaccine 29+ Years old  Completed   DEXA SCAN  Completed   HPV VACCINES  Aged Out   COVID-19 Vaccine  Discontinued    Health Maintenance  Health Maintenance Due  Topic Date Due   Zoster Vaccines- Shingrix (1 of 2) Never done   INFLUENZA VACCINE  05/27/2023    Colorectal cancer screening: No longer required.   Mammogram status: Completed 02/02/2023. Repeat every year  Bone Density status: Discontinued  Lung Cancer Screening: (Low Dose CT Chest recommended if Age 6-80 years, 20 pack-year currently smoking OR have quit w/in 15years.) does not qualify.   Lung Cancer Screening Referral: no  Additional Screening:  Hepatitis C Screening: does not qualify; Completed: no  Vision Screening: Recommended annual ophthalmology exams for early detection of glaucoma and other disorders of the eye. Is the patient up to date with their annual eye exam?  Yes  Who is the provider or what is the name of the office in which the patient attends annual eye exams? [facility] If pt is not established with a provider, would they like to be referred to a provider to establish care? No .   Dental Screening: Recommended annual dental exams for  proper oral hygiene  Diabetic Foot Exam: N/A  Community Resource Referral / Chronic Care Management: CRR required this visit?  No   CCM required this visit?  No     Plan:     I have personally reviewed and noted the following in the patient's chart:   Medical and social history Use of alcohol, tobacco or illicit drugs  Current medications and supplements including opioid prescriptions. Patient is not currently taking opioid prescriptions. Functional ability and status Nutritional status Physical activity Advanced directives List of other physicians Hospitalizations, surgeries, and ER  visits in previous 12 months Vitals Screenings to include cognitive, depression, and falls Referrals and appointments  In addition, I have reviewed and discussed with patient certain preventive protocols, quality metrics, and best practice recommendations. A written personalized care plan for preventive services as well as general preventive health recommendations were provided to patient.     Mickeal Needy, LPN   [id number]   After Visit Summary: (Mail) Due to this being a telephonic visit, the after visit summary with patients personalized plan was offered to patient via mail   Nurse Notes: Patient has memory loss.

## 2023-06-23 NOTE — Patient Instructions (Addendum)
Ms. Swiecicki , Thank you for taking time to come for your Medicare Wellness Visit. I appreciate your ongoing commitment to your health goals. Please review the following plan we discussed and let me know if I can assist you in the future.   Referrals/Orders/Follow-Ups/Clinician Recommendations: No  This is a list of the screening recommended for you and due dates:  Health Maintenance  Topic Date Due   Zoster (Shingles) Vaccine (1 of 2) Never done   Flu Shot  05/27/2023   Medicare Annual Wellness Visit  06/22/2024   DTaP/Tdap/Td vaccine (2 - Td or Tdap) 11/23/2027   Pneumonia Vaccine  Completed   DEXA scan (bone density measurement)  Completed   HPV Vaccine  Aged Out   COVID-19 Vaccine  Discontinued    Advanced directives: (Copy Requested) Please bring a copy of your health care power of attorney and living will to the office to be added to your chart at your convenience.  Next Medicare Annual Wellness Visit scheduled for next year: Yes  Preventive Care attachment Insert FALL PREVENTION attachment

## 2023-06-24 DIAGNOSIS — R7303 Prediabetes: Secondary | ICD-10-CM | POA: Diagnosis not present

## 2023-06-24 DIAGNOSIS — M109 Gout, unspecified: Secondary | ICD-10-CM | POA: Diagnosis not present

## 2023-06-24 DIAGNOSIS — I130 Hypertensive heart and chronic kidney disease with heart failure and stage 1 through stage 4 chronic kidney disease, or unspecified chronic kidney disease: Secondary | ICD-10-CM | POA: Diagnosis not present

## 2023-06-24 DIAGNOSIS — N184 Chronic kidney disease, stage 4 (severe): Secondary | ICD-10-CM | POA: Diagnosis not present

## 2023-06-24 DIAGNOSIS — M47816 Spondylosis without myelopathy or radiculopathy, lumbar region: Secondary | ICD-10-CM | POA: Diagnosis not present

## 2023-06-24 DIAGNOSIS — M19011 Primary osteoarthritis, right shoulder: Secondary | ICD-10-CM | POA: Diagnosis not present

## 2023-06-24 DIAGNOSIS — M19012 Primary osteoarthritis, left shoulder: Secondary | ICD-10-CM | POA: Diagnosis not present

## 2023-06-24 DIAGNOSIS — E785 Hyperlipidemia, unspecified: Secondary | ICD-10-CM | POA: Diagnosis not present

## 2023-06-24 DIAGNOSIS — K219 Gastro-esophageal reflux disease without esophagitis: Secondary | ICD-10-CM | POA: Diagnosis not present

## 2023-06-24 DIAGNOSIS — G8929 Other chronic pain: Secondary | ICD-10-CM | POA: Diagnosis not present

## 2023-06-24 DIAGNOSIS — Z7952 Long term (current) use of systemic steroids: Secondary | ICD-10-CM | POA: Diagnosis not present

## 2023-06-24 DIAGNOSIS — M129 Arthropathy, unspecified: Secondary | ICD-10-CM | POA: Diagnosis not present

## 2023-06-24 DIAGNOSIS — Z7984 Long term (current) use of oral hypoglycemic drugs: Secondary | ICD-10-CM | POA: Diagnosis not present

## 2023-06-24 DIAGNOSIS — Z79899 Other long term (current) drug therapy: Secondary | ICD-10-CM | POA: Diagnosis not present

## 2023-06-24 DIAGNOSIS — Z7982 Long term (current) use of aspirin: Secondary | ICD-10-CM | POA: Diagnosis not present

## 2023-06-24 DIAGNOSIS — R5383 Other fatigue: Secondary | ICD-10-CM | POA: Diagnosis not present

## 2023-06-24 DIAGNOSIS — J302 Other seasonal allergic rhinitis: Secondary | ICD-10-CM | POA: Diagnosis not present

## 2023-06-24 DIAGNOSIS — E559 Vitamin D deficiency, unspecified: Secondary | ICD-10-CM | POA: Diagnosis not present

## 2023-06-24 DIAGNOSIS — I509 Heart failure, unspecified: Secondary | ICD-10-CM | POA: Diagnosis not present

## 2023-06-25 ENCOUNTER — Ambulatory Visit: Payer: Medicare HMO | Admitting: Internal Medicine

## 2023-06-25 ENCOUNTER — Encounter: Payer: Self-pay | Admitting: Internal Medicine

## 2023-06-25 ENCOUNTER — Ambulatory Visit (INDEPENDENT_AMBULATORY_CARE_PROVIDER_SITE_OTHER): Payer: Medicare HMO

## 2023-06-25 VITALS — BP 128/78 | HR 59 | Temp 98.1°F | Ht 61.0 in | Wt 156.0 lb

## 2023-06-25 DIAGNOSIS — R0600 Dyspnea, unspecified: Secondary | ICD-10-CM | POA: Diagnosis not present

## 2023-06-25 DIAGNOSIS — I10 Essential (primary) hypertension: Secondary | ICD-10-CM

## 2023-06-25 DIAGNOSIS — E785 Hyperlipidemia, unspecified: Secondary | ICD-10-CM | POA: Insufficient documentation

## 2023-06-25 DIAGNOSIS — E7800 Pure hypercholesterolemia, unspecified: Secondary | ICD-10-CM | POA: Diagnosis not present

## 2023-06-25 DIAGNOSIS — E559 Vitamin D deficiency, unspecified: Secondary | ICD-10-CM

## 2023-06-25 DIAGNOSIS — N184 Chronic kidney disease, stage 4 (severe): Secondary | ICD-10-CM | POA: Diagnosis not present

## 2023-06-25 DIAGNOSIS — R7302 Impaired glucose tolerance (oral): Secondary | ICD-10-CM | POA: Diagnosis not present

## 2023-06-25 DIAGNOSIS — R918 Other nonspecific abnormal finding of lung field: Secondary | ICD-10-CM | POA: Diagnosis not present

## 2023-06-25 DIAGNOSIS — J9811 Atelectasis: Secondary | ICD-10-CM | POA: Diagnosis not present

## 2023-06-25 DIAGNOSIS — E538 Deficiency of other specified B group vitamins: Secondary | ICD-10-CM

## 2023-06-25 DIAGNOSIS — E611 Iron deficiency: Secondary | ICD-10-CM | POA: Diagnosis not present

## 2023-06-25 LAB — CBC WITH DIFFERENTIAL/PLATELET
Basophils Absolute: 0 10*3/uL (ref 0.0–0.1)
Basophils Relative: 0.4 % (ref 0.0–3.0)
Eosinophils Absolute: 0.1 10*3/uL (ref 0.0–0.7)
Eosinophils Relative: 1.4 % (ref 0.0–5.0)
HCT: 40.6 % (ref 36.0–46.0)
Hemoglobin: 12.9 g/dL (ref 12.0–15.0)
Lymphocytes Relative: 13.9 % (ref 12.0–46.0)
Lymphs Abs: 1.2 10*3/uL (ref 0.7–4.0)
MCHC: 31.8 g/dL (ref 30.0–36.0)
MCV: 104.1 fl — ABNORMAL HIGH (ref 78.0–100.0)
Monocytes Absolute: 0.5 10*3/uL (ref 0.1–1.0)
Monocytes Relative: 5.4 % (ref 3.0–12.0)
Neutro Abs: 6.8 10*3/uL (ref 1.4–7.7)
Neutrophils Relative %: 78.9 % — ABNORMAL HIGH (ref 43.0–77.0)
Platelets: 221 10*3/uL (ref 150.0–400.0)
RBC: 3.9 Mil/uL (ref 3.87–5.11)
RDW: 13.6 % (ref 11.5–15.5)
WBC: 8.6 10*3/uL (ref 4.0–10.5)

## 2023-06-25 LAB — BASIC METABOLIC PANEL WITH GFR
BUN: 40 mg/dL — ABNORMAL HIGH (ref 6–23)
CO2: 34 meq/L — ABNORMAL HIGH (ref 19–32)
Calcium: 9.9 mg/dL (ref 8.4–10.5)
Chloride: 99 meq/L (ref 96–112)
Creatinine, Ser: 1.87 mg/dL — ABNORMAL HIGH (ref 0.40–1.20)
GFR: 24.21 mL/min — ABNORMAL LOW
Glucose, Bld: 106 mg/dL — ABNORMAL HIGH (ref 70–99)
Potassium: 4.6 meq/L (ref 3.5–5.1)
Sodium: 142 meq/L (ref 135–145)

## 2023-06-25 LAB — URINALYSIS, ROUTINE W REFLEX MICROSCOPIC
Bilirubin Urine: NEGATIVE
Ketones, ur: NEGATIVE
Nitrite: NEGATIVE
Specific Gravity, Urine: 1.01 (ref 1.000–1.030)
Total Protein, Urine: NEGATIVE
Urine Glucose: 250 — AB
Urobilinogen, UA: 0.2 (ref 0.0–1.0)
pH: 7 (ref 5.0–8.0)

## 2023-06-25 LAB — HEPATIC FUNCTION PANEL
ALT: 34 U/L (ref 0–35)
AST: 28 U/L (ref 0–37)
Albumin: 4.1 g/dL (ref 3.5–5.2)
Alkaline Phosphatase: 55 U/L (ref 39–117)
Bilirubin, Direct: 0.1 mg/dL (ref 0.0–0.3)
Total Bilirubin: 0.5 mg/dL (ref 0.2–1.2)
Total Protein: 6.9 g/dL (ref 6.0–8.3)

## 2023-06-25 LAB — LIPID PANEL
Cholesterol: 231 mg/dL — ABNORMAL HIGH (ref 0–200)
HDL: 69.1 mg/dL (ref >39.00)
LDL Cholesterol: 123 mg/dL — ABNORMAL HIGH (ref 0–99)
NonHDL: 162.39
Total CHOL/HDL Ratio: 3
Triglycerides: 198 mg/dL — ABNORMAL HIGH (ref 0.0–149.0)
VLDL: 39.6 mg/dL (ref 0.0–40.0)

## 2023-06-25 LAB — IBC PANEL
Iron: 76 ug/dL (ref 42–145)
Saturation Ratios: 20.3 % (ref 20.0–50.0)
TIBC: 373.8 ug/dL (ref 250.0–450.0)
Transferrin: 267 mg/dL (ref 212.0–360.0)

## 2023-06-25 LAB — FERRITIN: Ferritin: 37.8 ng/mL (ref 10.0–291.0)

## 2023-06-25 LAB — VITAMIN D 25 HYDROXY (VIT D DEFICIENCY, FRACTURES): VITD: 79.47 ng/mL (ref 30.00–100.00)

## 2023-06-25 LAB — TSH: TSH: 1.09 u[IU]/mL (ref 0.35–5.50)

## 2023-06-25 LAB — VITAMIN B12: Vitamin B-12: >1501 pg/mL — ABNORMAL HIGH (ref 211–911)

## 2023-06-25 LAB — HEMOGLOBIN A1C: Hgb A1c MFr Bld: 6 % (ref 4.6–6.5)

## 2023-06-25 LAB — BRAIN NATRIURETIC PEPTIDE: Pro B Natriuretic peptide (BNP): 140 pg/mL — ABNORMAL HIGH (ref 0.0–100.0)

## 2023-06-25 MED ORDER — FLUTICASONE-SALMETEROL 500-50 MCG/ACT IN AEPB: 1.0000 | INHALATION_SPRAY | Freq: Two times a day (BID) | RESPIRATORY_TRACT | 3 refills | Status: DC

## 2023-06-25 NOTE — Assessment & Plan Note (Signed)
Lab Results  Component Value Date   CREATININE 1.87 (H) 06/25/2023   Stable overall, cont to avoid nephrotoxins

## 2023-06-25 NOTE — Patient Instructions (Addendum)
Ok to increase the wixhela to 500 mg  Please continue all other medications as before, and refills have been done if requested.  Please have the pharmacy call with any other refills you may need.  Please continue your efforts at being more active, low cholesterol diet, and weight control.  Please keep your appointments with your specialists as you may have planned  You will be contacted regarding the referral for: pulmonary  Please go to the XRAY Department in the first floor for the x-ray testing  Please go to the LAB at the blood drawing area for the tests to be done  Please make an Appointment to return in Nov 22, or sooner if needed

## 2023-06-25 NOTE — Assessment & Plan Note (Signed)
Last vitamin D Lab Results  Component Value Date   VD25OH 79.47 06/25/2023   Stable, cont oral replacement

## 2023-06-25 NOTE — Assessment & Plan Note (Signed)
Lab Results  Component Value Date   VITAMINB12 >1501 (H) 06/25/2023   Stable, cont oral replacement - b12 1000 mcg qd

## 2023-06-25 NOTE — Assessment & Plan Note (Signed)
Etiology unclear, for cxr, lab including bnp, increase wixela 500 and refer pulm per pt request

## 2023-06-25 NOTE — Assessment & Plan Note (Signed)
Lab Results  Component Value Date   HGBA1C 6.0 06/25/2023   Stable, pt to continue current medical treatment  - diet, wt control

## 2023-06-25 NOTE — Assessment & Plan Note (Signed)
Lab Results  Component Value Date   LDLCALC 123 (H) 06/25/2023   Uncontrolled, goal ldl < 70, pt declines statin for now

## 2023-06-25 NOTE — Assessment & Plan Note (Signed)
BP Readings from Last 3 Encounters:  06/25/23 128/78  06/08/23 (!) 152/83  05/10/23 (!) 142/76   Stable, pt to continue medical treatment norvasc 5 mg every day, losartan 100 qd

## 2023-06-25 NOTE — Progress Notes (Signed)
Patient ID: [name], female   DOB: 1936-01-23, 87 y.o.   MRN: [id number]        Chief Complaint: follow up dyspnea, hx of asthma, dCHF,  htn, ckd3a, hyperglyemia, , low vit d and b12,        HPI:  [name] is a 87 y.o. female here with c/o mild worsening dyspnea in the past 1-2 wk with mild wheezing, cough, but no fever, chills.  Pt denies chest pain, orthopnea, PND, increased LE swelling, palpitations, dizziness or syncope.   Pt denies polydipsia, polyuria, or new focal neuro s/s.    Pt denies fever, wt loss, night sweats, loss of appetite, or other constitutional symptoms         Wt Readings from Last 3 Encounters:  06/25/23 156 lb (70.8 kg)  06/23/23 158 lb (71.7 kg)  06/08/23 158 lb (71.7 kg)   BP Readings from Last 3 Encounters:  06/25/23 128/78  06/08/23 (!) 152/83  05/10/23 (!) 142/76         Past Medical History:  Diagnosis Date   Allergic rhinitis 10/30/2016   Anemia    Asthma    Breast cancer of upper-outer quadrant of left female breast (HCC) 11/08/2013   ER/PR+ Her2- Left IDC    Chronic renal insufficiency    Chronic rhinitis    Colon polyp    Diastolic dysfunction 07/10/2016   DJD (degenerative joint disease)    Dyspnea    Frozen shoulder    Full dentures    GERD (gastroesophageal reflux disease)    Hearing loss    Hypertension    Hyponatremia    Impaired glucose tolerance 07/18/2014   Memory loss    Morbid obesity (HCC)    Poor circulation    Vertigo    Wears glasses    Past Surgical History:  Procedure Laterality Date   ABDOMINAL HYSTERECTOMY     BREAST LUMPECTOMY WITH NEEDLE LOCALIZATION AND AXILLARY SENTINEL LYMPH NODE BX Left 12/04/2013   Procedure: BREAST LUMPECTOMY WITH NEEDLE LOCALIZATION AND AXILLARY SENTINEL LYMPH NODE BX;  Surgeon: Kandis Cocking, MD;  Location: [facility];  Service: General;  Laterality: Left;   CATARACT EXTRACTION  2009   rt   COLONOSCOPY     EYE SURGERY Bilateral    cataract surgery    KNEE ARTHROSCOPY     both   LUMBAR LAMINECTOMY/DECOMPRESSION MICRODISCECTOMY Left 12/23/2017   Procedure: Left Lumbar One-Two Laminectomy with microdiscectomy;  Surgeon: Tia Alert, MD;  Location: [facility];  Service: Neurosurgery;  Laterality: Left;  Left L1-2 Laminectomy with microdiscectomy   TONSILLECTOMY     TOTAL KNEE ARTHROPLASTY  2002   rt   TOTAL KNEE ARTHROPLASTY  2003   left   VESICOVAGINAL FISTULA CLOSURE W/ TAH  1980    reports that she has never smoked. She has never been exposed to tobacco smoke. She has never used smokeless tobacco. She reports that she does not drink alcohol and does not use drugs. family history includes Colon cancer in her mother; Healthy in her daughter; Heart attack (age of onset: 15) in her son; Lung cancer in her brother; Stomach cancer in her mother. Allergies  Allergen Reactions   Hydrocodone Itching   Lasix [Furosemide] Other (See Comments)    Dizziness.    Tizanidine Other (See Comments)    Dizzy and fall   Iron Hives and Other (See Comments)     bad constipation    Current Outpatient Medications on File  Prior to Visit  Medication Sig Dispense Refill   albuterol (VENTOLIN HFA) 108 (90 Base) MCG/ACT inhaler Inhale 1 puff into the lungs every 6 (six) hours as needed for wheezing or shortness of breath. 18 g 5   allopurinol (ZYLOPRIM) 100 MG tablet Take 100 mg by mouth daily.     amLODipine (NORVASC) 5 MG tablet TAKE 1 TABLET BY MOUTH EVERY DAY (Patient taking differently: Take 5 mg by mouth every morning.) 90 tablet 3   aspirin EC 81 MG tablet Take 81 mg by mouth every morning. Swallow whole.     celecoxib (CELEBREX) 100 MG capsule Take 100 mg by mouth 2 (two) times daily.     Cholecalciferol (VITAMIN D3) 20 MCG (800 UNIT) TABS Take 1 tablet by mouth daily.     dapagliflozin propanediol (FARXIGA) 10 MG TABS tablet Take 1 tablet (10 mg total) by mouth daily before breakfast. 90 tablet 3   diclofenac Sodium (VOLTAREN) 1 % GEL Inhale 1 puff into  the lungs 2 (two) times daily.     diclofenac Sodium (VOLTAREN) 1 % GEL APPLY 2 GRAMS TO AFFECTED AREA 4 TIMES A DAY 100 g 5   Ensure (ENSURE) Take 1 Can by mouth 3 (three) times daily between meals. (Patient taking differently: Take 237 mLs by mouth See admin instructions. Drink one can (237 mls) by mouth once or twice daily) 237 mL 12   febuxostat (ULORIC) 40 MG tablet Take 40 mg by mouth daily.     FLUAD QUADRIVALENT 0.5 ML injection      furosemide (LASIX) 40 MG tablet Take 1 tablet (40 mg total) by mouth every Monday, Wednesday, and Friday.     gabapentin (NEURONTIN) 300 MG capsule TAKE 1 CAPSULE BY MOUTH THREE TIMES A DAY 90 capsule 5   Incontinence Supply Disposable (DEPEND UNDERWEAR SM/MED) MISC Use as directed four times per day 120 each 5   lidocaine (LIDODERM) 5 % Place 1 patch onto the skin daily. Remove & Discard patch within 12 hours or as directed by MD 30 patch 0   losartan (COZAAR) 100 MG tablet Take 100 mg by mouth daily.     magnesium gluconate (MAGONATE) 500 MG tablet Take 1 tablet (500 mg total) by mouth 2 (two) times daily. 180 tablet 1   meclizine (ANTIVERT) 12.5 MG tablet TAKE 1 TABLET BY MOUTH THREE TIMES A DAY AS NEEDED FOR DIZZINESS (Patient taking differently: Take 12.5 mg by mouth 3 (three) times daily as needed for dizziness.) 90 tablet 2   metoprolol succinate (TOPROL-XL) 25 MG 24 hr tablet Take 1 tablet (25 mg total) by mouth daily. 90 tablet 3   montelukast (SINGULAIR) 10 MG tablet TAKE 1 TABLET BY MOUTH EVERY DAY 90 tablet 2   Multiple Vitamin (MULTIVITAMIN WITH MINERALS) TABS tablet Take 1 tablet by mouth daily. Centrum Silver     pantoprazole (PROTONIX) 40 MG tablet TAKE 1 TABLET BY MOUTH EVERY DAY 90 tablet 3   potassium chloride (KLOR-CON) 10 MEQ tablet TAKE 1 TABLET BY MOUTH EVERY DAY WHEN TAKING FUROSEMIDE (Patient taking differently: Take 10 mEq by mouth every Monday, Wednesday, and Friday.) 90 tablet 3   predniSONE (DELTASONE) 5 MG tablet Take 1 tablet (5 mg  total) by mouth daily with breakfast. 90 tablet 3   SPIKEVAX syringe      triamcinolone cream (KENALOG) 0.1 % APPLY TO AFFECTED AREA TWICE A DAY 30 g 1   No current facility-administered medications on file prior to visit.  ROS:  All others reviewed and negative.  Objective        PE:  BP 128/78 (BP Location: Left Arm, Patient Position: Sitting, Cuff Size: Normal)   Pulse (!) 59   Temp 98.1 F (36.7 C) (Oral)   Ht 5\' 1"  (1.549 m)   Wt 156 lb (70.8 kg)   SpO2 98%   BMI 29.48 kg/m                 Constitutional: Pt appears in NAD               HENT: Head: NCAT.                Right Ear: External ear normal.                 Left Ear: External ear normal.                Eyes: . Pupils are equal, round, and reactive to light. Conjunctivae and EOM are normal               Nose: without d/c or deformity               Neck: Neck supple. Gross normal ROM               Cardiovascular: Normal rate and regular rhythm.                 Pulmonary/Chest: Effort normal and breath sounds without rales or wheezing.                Abd:  Soft, NT, ND, + BS, no organomegaly               Neurological: Pt is alert. At baseline orientation, motor grossly intact               Skin: Skin is warm. No rashes, no other new lesions, LE edema - none               Psychiatric: Pt behavior is normal without agitation   Micro: none  Cardiac tracings I have personally interpreted today:  none  Pertinent Radiological findings (summarize): none   Lab Results  Component Value Date   WBC 8.6 06/25/2023   HGB 12.9 06/25/2023   HCT 40.6 06/25/2023   PLT 221.0 06/25/2023   GLUCOSE 106 (H) 06/25/2023   CHOL 231 (H) 06/25/2023   TRIG 198.0 (H) 06/25/2023   HDL 69.10 06/25/2023   LDLDIRECT 137.0 02/02/2022   LDLCALC 123 (H) 06/25/2023   ALT 34 06/25/2023   AST 28 06/25/2023   NA 142 06/25/2023   K 4.6 06/25/2023   CL 99 06/25/2023   CREATININE 1.87 (H) 06/25/2023   BUN 40 (H) 06/25/2023   CO2 34  (H) 06/25/2023   TSH 1.09 06/25/2023   INR 1.0 07/27/2021   HGBA1C 6.0 06/25/2023   Assessment/Plan:  [name] is a 87 y.o. Black or African American [2] female with  has a past medical history of Allergic rhinitis (10/30/2016), Anemia, Asthma, Breast cancer of upper-outer quadrant of left female breast (HCC) (11/08/2013), Chronic renal insufficiency, Chronic rhinitis, Colon polyp, Diastolic dysfunction (07/10/2016), DJD (degenerative joint disease), Dyspnea, Frozen shoulder, Full dentures, GERD (gastroesophageal reflux disease), Hearing loss, Hypertension, Hyponatremia, Impaired glucose tolerance (07/18/2014), Memory loss, Morbid obesity (HCC), Poor circulation, Vertigo, and Wears glasses.  Dyspnea Etiology unclear, for cxr, lab including bnp, increase wixela 500 and refer pulm per pt request  B12 deficiency Lab Results  Component Value Date   VITAMINB12 >1501 (H) 06/25/2023   Stable, cont oral replacement - b12 1000 mcg qd   CKD (chronic kidney disease) stage 4, GFR 15-29 ml/min (HCC) Lab Results  Component Value Date   CREATININE 1.87 (H) 06/25/2023   Stable overall, cont to avoid nephrotoxins   Essential hypertension BP Readings from Last 3 Encounters:  06/25/23 128/78  06/08/23 (!) 152/83  05/10/23 (!) 142/76   Stable, pt to continue medical treatment norvasc 5 mg every day, losartan 100 qd   Impaired glucose tolerance Lab Results  Component Value Date   HGBA1C 6.0 06/25/2023   Stable, pt to continue current medical treatment  - diet, wt control   Vitamin D deficiency Last vitamin D Lab Results  Component Value Date   VD25OH 79.47 06/25/2023   Stable, cont oral replacement   HLD (hyperlipidemia) Lab Results  Component Value Date   LDLCALC 123 (H) 06/25/2023   Uncontrolled, goal ldl < 70, pt declines statin for now  Followup: Return in about 12 weeks (around 09/17/2023).  Oliver Barre, MD 06/25/2023 8:56 PM [facility] [place]  Primary Care - [facility] Internal Medicine

## 2023-06-26 ENCOUNTER — Encounter: Payer: Self-pay | Admitting: Internal Medicine

## 2023-07-02 DIAGNOSIS — E785 Hyperlipidemia, unspecified: Secondary | ICD-10-CM | POA: Diagnosis not present

## 2023-07-02 DIAGNOSIS — R7303 Prediabetes: Secondary | ICD-10-CM | POA: Diagnosis not present

## 2023-07-02 DIAGNOSIS — J302 Other seasonal allergic rhinitis: Secondary | ICD-10-CM | POA: Diagnosis not present

## 2023-07-02 DIAGNOSIS — Z79899 Other long term (current) drug therapy: Secondary | ICD-10-CM | POA: Diagnosis not present

## 2023-07-02 DIAGNOSIS — E559 Vitamin D deficiency, unspecified: Secondary | ICD-10-CM | POA: Diagnosis not present

## 2023-07-02 DIAGNOSIS — Z7984 Long term (current) use of oral hypoglycemic drugs: Secondary | ICD-10-CM | POA: Diagnosis not present

## 2023-07-02 DIAGNOSIS — M19012 Primary osteoarthritis, left shoulder: Secondary | ICD-10-CM | POA: Diagnosis not present

## 2023-07-02 DIAGNOSIS — K219 Gastro-esophageal reflux disease without esophagitis: Secondary | ICD-10-CM | POA: Diagnosis not present

## 2023-07-02 DIAGNOSIS — I509 Heart failure, unspecified: Secondary | ICD-10-CM | POA: Diagnosis not present

## 2023-07-02 DIAGNOSIS — Z7952 Long term (current) use of systemic steroids: Secondary | ICD-10-CM | POA: Diagnosis not present

## 2023-07-02 DIAGNOSIS — I130 Hypertensive heart and chronic kidney disease with heart failure and stage 1 through stage 4 chronic kidney disease, or unspecified chronic kidney disease: Secondary | ICD-10-CM | POA: Diagnosis not present

## 2023-07-02 DIAGNOSIS — M47816 Spondylosis without myelopathy or radiculopathy, lumbar region: Secondary | ICD-10-CM | POA: Diagnosis not present

## 2023-07-02 DIAGNOSIS — M19011 Primary osteoarthritis, right shoulder: Secondary | ICD-10-CM | POA: Diagnosis not present

## 2023-07-02 DIAGNOSIS — R5383 Other fatigue: Secondary | ICD-10-CM | POA: Diagnosis not present

## 2023-07-02 DIAGNOSIS — N184 Chronic kidney disease, stage 4 (severe): Secondary | ICD-10-CM | POA: Diagnosis not present

## 2023-07-02 DIAGNOSIS — G8929 Other chronic pain: Secondary | ICD-10-CM | POA: Diagnosis not present

## 2023-07-02 DIAGNOSIS — M129 Arthropathy, unspecified: Secondary | ICD-10-CM | POA: Diagnosis not present

## 2023-07-02 DIAGNOSIS — M109 Gout, unspecified: Secondary | ICD-10-CM | POA: Diagnosis not present

## 2023-07-02 DIAGNOSIS — Z7982 Long term (current) use of aspirin: Secondary | ICD-10-CM | POA: Diagnosis not present

## 2023-07-03 DIAGNOSIS — N184 Chronic kidney disease, stage 4 (severe): Secondary | ICD-10-CM | POA: Diagnosis not present

## 2023-07-03 DIAGNOSIS — R7303 Prediabetes: Secondary | ICD-10-CM | POA: Diagnosis not present

## 2023-07-03 DIAGNOSIS — R0602 Shortness of breath: Secondary | ICD-10-CM | POA: Diagnosis not present

## 2023-07-03 DIAGNOSIS — R600 Localized edema: Secondary | ICD-10-CM | POA: Diagnosis not present

## 2023-07-03 DIAGNOSIS — I10 Essential (primary) hypertension: Secondary | ICD-10-CM | POA: Diagnosis not present

## 2023-07-05 ENCOUNTER — Ambulatory Visit: Payer: Medicare HMO | Admitting: Podiatry

## 2023-07-05 ENCOUNTER — Encounter: Payer: Self-pay | Admitting: Internal Medicine

## 2023-07-05 DIAGNOSIS — J302 Other seasonal allergic rhinitis: Secondary | ICD-10-CM | POA: Diagnosis not present

## 2023-07-05 DIAGNOSIS — G8929 Other chronic pain: Secondary | ICD-10-CM | POA: Diagnosis not present

## 2023-07-05 DIAGNOSIS — I130 Hypertensive heart and chronic kidney disease with heart failure and stage 1 through stage 4 chronic kidney disease, or unspecified chronic kidney disease: Secondary | ICD-10-CM | POA: Diagnosis not present

## 2023-07-05 DIAGNOSIS — M47816 Spondylosis without myelopathy or radiculopathy, lumbar region: Secondary | ICD-10-CM | POA: Diagnosis not present

## 2023-07-05 DIAGNOSIS — E785 Hyperlipidemia, unspecified: Secondary | ICD-10-CM | POA: Diagnosis not present

## 2023-07-05 DIAGNOSIS — M19012 Primary osteoarthritis, left shoulder: Secondary | ICD-10-CM | POA: Diagnosis not present

## 2023-07-05 DIAGNOSIS — R7303 Prediabetes: Secondary | ICD-10-CM | POA: Diagnosis not present

## 2023-07-05 DIAGNOSIS — Z7952 Long term (current) use of systemic steroids: Secondary | ICD-10-CM | POA: Diagnosis not present

## 2023-07-05 DIAGNOSIS — M19011 Primary osteoarthritis, right shoulder: Secondary | ICD-10-CM | POA: Diagnosis not present

## 2023-07-05 DIAGNOSIS — K219 Gastro-esophageal reflux disease without esophagitis: Secondary | ICD-10-CM | POA: Diagnosis not present

## 2023-07-05 DIAGNOSIS — M129 Arthropathy, unspecified: Secondary | ICD-10-CM | POA: Diagnosis not present

## 2023-07-05 DIAGNOSIS — I509 Heart failure, unspecified: Secondary | ICD-10-CM | POA: Diagnosis not present

## 2023-07-05 DIAGNOSIS — Z79899 Other long term (current) drug therapy: Secondary | ICD-10-CM | POA: Diagnosis not present

## 2023-07-05 DIAGNOSIS — Z7984 Long term (current) use of oral hypoglycemic drugs: Secondary | ICD-10-CM | POA: Diagnosis not present

## 2023-07-05 DIAGNOSIS — Z7982 Long term (current) use of aspirin: Secondary | ICD-10-CM | POA: Diagnosis not present

## 2023-07-05 DIAGNOSIS — E559 Vitamin D deficiency, unspecified: Secondary | ICD-10-CM | POA: Diagnosis not present

## 2023-07-05 DIAGNOSIS — N184 Chronic kidney disease, stage 4 (severe): Secondary | ICD-10-CM | POA: Diagnosis not present

## 2023-07-05 DIAGNOSIS — R5383 Other fatigue: Secondary | ICD-10-CM | POA: Diagnosis not present

## 2023-07-05 DIAGNOSIS — M109 Gout, unspecified: Secondary | ICD-10-CM | POA: Diagnosis not present

## 2023-07-09 DIAGNOSIS — Z79899 Other long term (current) drug therapy: Secondary | ICD-10-CM | POA: Diagnosis not present

## 2023-07-09 DIAGNOSIS — I130 Hypertensive heart and chronic kidney disease with heart failure and stage 1 through stage 4 chronic kidney disease, or unspecified chronic kidney disease: Secondary | ICD-10-CM | POA: Diagnosis not present

## 2023-07-09 DIAGNOSIS — N184 Chronic kidney disease, stage 4 (severe): Secondary | ICD-10-CM | POA: Diagnosis not present

## 2023-07-09 DIAGNOSIS — Z7982 Long term (current) use of aspirin: Secondary | ICD-10-CM | POA: Diagnosis not present

## 2023-07-09 DIAGNOSIS — E785 Hyperlipidemia, unspecified: Secondary | ICD-10-CM | POA: Diagnosis not present

## 2023-07-09 DIAGNOSIS — R5383 Other fatigue: Secondary | ICD-10-CM | POA: Diagnosis not present

## 2023-07-09 DIAGNOSIS — M19012 Primary osteoarthritis, left shoulder: Secondary | ICD-10-CM | POA: Diagnosis not present

## 2023-07-09 DIAGNOSIS — M47816 Spondylosis without myelopathy or radiculopathy, lumbar region: Secondary | ICD-10-CM | POA: Diagnosis not present

## 2023-07-09 DIAGNOSIS — M109 Gout, unspecified: Secondary | ICD-10-CM | POA: Diagnosis not present

## 2023-07-09 DIAGNOSIS — R7303 Prediabetes: Secondary | ICD-10-CM | POA: Diagnosis not present

## 2023-07-09 DIAGNOSIS — M129 Arthropathy, unspecified: Secondary | ICD-10-CM | POA: Diagnosis not present

## 2023-07-09 DIAGNOSIS — E559 Vitamin D deficiency, unspecified: Secondary | ICD-10-CM | POA: Diagnosis not present

## 2023-07-09 DIAGNOSIS — M19011 Primary osteoarthritis, right shoulder: Secondary | ICD-10-CM | POA: Diagnosis not present

## 2023-07-09 DIAGNOSIS — Z7984 Long term (current) use of oral hypoglycemic drugs: Secondary | ICD-10-CM | POA: Diagnosis not present

## 2023-07-09 DIAGNOSIS — I509 Heart failure, unspecified: Secondary | ICD-10-CM | POA: Diagnosis not present

## 2023-07-09 DIAGNOSIS — G8929 Other chronic pain: Secondary | ICD-10-CM | POA: Diagnosis not present

## 2023-07-09 DIAGNOSIS — J302 Other seasonal allergic rhinitis: Secondary | ICD-10-CM | POA: Diagnosis not present

## 2023-07-09 DIAGNOSIS — Z7952 Long term (current) use of systemic steroids: Secondary | ICD-10-CM | POA: Diagnosis not present

## 2023-07-09 DIAGNOSIS — K219 Gastro-esophageal reflux disease without esophagitis: Secondary | ICD-10-CM | POA: Diagnosis not present

## 2023-07-14 ENCOUNTER — Ambulatory Visit: Payer: Medicare HMO | Admitting: Podiatry

## 2023-07-14 DIAGNOSIS — R3129 Other microscopic hematuria: Secondary | ICD-10-CM | POA: Diagnosis not present

## 2023-07-14 DIAGNOSIS — N2581 Secondary hyperparathyroidism of renal origin: Secondary | ICD-10-CM | POA: Diagnosis not present

## 2023-07-14 DIAGNOSIS — N281 Cyst of kidney, acquired: Secondary | ICD-10-CM | POA: Diagnosis not present

## 2023-07-14 DIAGNOSIS — R600 Localized edema: Secondary | ICD-10-CM | POA: Diagnosis not present

## 2023-07-14 DIAGNOSIS — R809 Proteinuria, unspecified: Secondary | ICD-10-CM | POA: Diagnosis not present

## 2023-07-14 DIAGNOSIS — I129 Hypertensive chronic kidney disease with stage 1 through stage 4 chronic kidney disease, or unspecified chronic kidney disease: Secondary | ICD-10-CM | POA: Diagnosis not present

## 2023-07-14 DIAGNOSIS — E871 Hypo-osmolality and hyponatremia: Secondary | ICD-10-CM | POA: Diagnosis not present

## 2023-07-14 DIAGNOSIS — N184 Chronic kidney disease, stage 4 (severe): Secondary | ICD-10-CM | POA: Diagnosis not present

## 2023-07-14 DIAGNOSIS — D631 Anemia in chronic kidney disease: Secondary | ICD-10-CM | POA: Diagnosis not present

## 2023-07-14 DIAGNOSIS — R7303 Prediabetes: Secondary | ICD-10-CM | POA: Diagnosis not present

## 2023-07-15 LAB — LAB REPORT - SCANNED: EGFR: 28

## 2023-07-16 ENCOUNTER — Ambulatory Visit: Payer: Medicare HMO | Admitting: Podiatry

## 2023-07-16 DIAGNOSIS — Z7952 Long term (current) use of systemic steroids: Secondary | ICD-10-CM | POA: Diagnosis not present

## 2023-07-16 DIAGNOSIS — E785 Hyperlipidemia, unspecified: Secondary | ICD-10-CM | POA: Diagnosis not present

## 2023-07-16 DIAGNOSIS — M109 Gout, unspecified: Secondary | ICD-10-CM | POA: Diagnosis not present

## 2023-07-16 DIAGNOSIS — M19011 Primary osteoarthritis, right shoulder: Secondary | ICD-10-CM | POA: Diagnosis not present

## 2023-07-16 DIAGNOSIS — R7303 Prediabetes: Secondary | ICD-10-CM | POA: Diagnosis not present

## 2023-07-16 DIAGNOSIS — E559 Vitamin D deficiency, unspecified: Secondary | ICD-10-CM | POA: Diagnosis not present

## 2023-07-16 DIAGNOSIS — M79675 Pain in left toe(s): Secondary | ICD-10-CM | POA: Diagnosis not present

## 2023-07-16 DIAGNOSIS — K219 Gastro-esophageal reflux disease without esophagitis: Secondary | ICD-10-CM | POA: Diagnosis not present

## 2023-07-16 DIAGNOSIS — I509 Heart failure, unspecified: Secondary | ICD-10-CM | POA: Diagnosis not present

## 2023-07-16 DIAGNOSIS — M129 Arthropathy, unspecified: Secondary | ICD-10-CM | POA: Diagnosis not present

## 2023-07-16 DIAGNOSIS — I130 Hypertensive heart and chronic kidney disease with heart failure and stage 1 through stage 4 chronic kidney disease, or unspecified chronic kidney disease: Secondary | ICD-10-CM | POA: Diagnosis not present

## 2023-07-16 DIAGNOSIS — B351 Tinea unguium: Secondary | ICD-10-CM | POA: Diagnosis not present

## 2023-07-16 DIAGNOSIS — G8929 Other chronic pain: Secondary | ICD-10-CM | POA: Diagnosis not present

## 2023-07-16 DIAGNOSIS — M25511 Pain in right shoulder: Secondary | ICD-10-CM | POA: Diagnosis not present

## 2023-07-16 DIAGNOSIS — J302 Other seasonal allergic rhinitis: Secondary | ICD-10-CM | POA: Diagnosis not present

## 2023-07-16 DIAGNOSIS — N184 Chronic kidney disease, stage 4 (severe): Secondary | ICD-10-CM | POA: Diagnosis not present

## 2023-07-16 DIAGNOSIS — R5383 Other fatigue: Secondary | ICD-10-CM | POA: Diagnosis not present

## 2023-07-16 DIAGNOSIS — M25512 Pain in left shoulder: Secondary | ICD-10-CM | POA: Diagnosis not present

## 2023-07-16 DIAGNOSIS — M47816 Spondylosis without myelopathy or radiculopathy, lumbar region: Secondary | ICD-10-CM | POA: Diagnosis not present

## 2023-07-16 DIAGNOSIS — M79674 Pain in right toe(s): Secondary | ICD-10-CM

## 2023-07-16 DIAGNOSIS — M19012 Primary osteoarthritis, left shoulder: Secondary | ICD-10-CM | POA: Diagnosis not present

## 2023-07-16 DIAGNOSIS — Z7982 Long term (current) use of aspirin: Secondary | ICD-10-CM | POA: Diagnosis not present

## 2023-07-16 DIAGNOSIS — Z79899 Other long term (current) drug therapy: Secondary | ICD-10-CM | POA: Diagnosis not present

## 2023-07-16 DIAGNOSIS — Z7984 Long term (current) use of oral hypoglycemic drugs: Secondary | ICD-10-CM | POA: Diagnosis not present

## 2023-07-16 NOTE — Progress Notes (Signed)
Subjective:  Patient ID: [name], female    DOB: December 22, 1935,  MRN: [id number]  Chief Complaint  Patient presents with   Nail Problem    Nail trim    87 y.o. female returns for the above complaint.  Patient presents with thickened elongated dystrophic mycotic toenails x 10 mild pain on palpation patient would like for me to debride down she is not able to do it herself.  Objective:  There were no vitals filed for this visit. Podiatric Exam: Vascular: dorsalis pedis and posterior tibial pulses are palpable bilateral. Capillary return is immediate. Temperature gradient is WNL. Skin turgor WNL  Sensorium: Normal Semmes Weinstein monofilament test. Normal tactile sensation bilaterally. Nail Exam: Pt has thick disfigured discolored nails with subungual debris noted bilateral entire nail hallux through fifth toenails.  Pain on palpation to the nails. Ulcer Exam: There is no evidence of ulcer or pre-ulcerative changes or infection. Orthopedic Exam: Muscle tone and strength are WNL. No limitations in general ROM. No crepitus or effusions noted.  Skin: No Porokeratosis. No infection or ulcers    Assessment & Plan:   1. Pain due to onychomycosis of toenails of both feet      Patient was evaluated and treated and all questions answered.  Onychomycosis with pain  -Nails palliatively debrided as below. -Educated on self-care  Procedure: Nail Debridement Rationale: pain  Type of Debridement: manual, sharp debridement. Instrumentation: Nail nipper, rotary burr. Number of Nails: 10  Procedures and Treatment: Consent by patient was obtained for treatment procedures. The patient understood the discussion of treatment and procedures well. All questions were answered thoroughly reviewed. Debridement of mycotic and hypertrophic toenails, 1 through 5 bilateral and clearing of subungual debris. No ulceration, no infection noted.  Return Visit-Office Procedure: Patient instructed to  return to the office for a follow up visit 3 months for continued evaluation and treatment.  Nicholes Rough, DPM    No follow-ups on file.

## 2023-07-22 DIAGNOSIS — E785 Hyperlipidemia, unspecified: Secondary | ICD-10-CM | POA: Diagnosis not present

## 2023-07-22 DIAGNOSIS — M19012 Primary osteoarthritis, left shoulder: Secondary | ICD-10-CM | POA: Diagnosis not present

## 2023-07-22 DIAGNOSIS — Z79899 Other long term (current) drug therapy: Secondary | ICD-10-CM | POA: Diagnosis not present

## 2023-07-22 DIAGNOSIS — R5383 Other fatigue: Secondary | ICD-10-CM | POA: Diagnosis not present

## 2023-07-22 DIAGNOSIS — I130 Hypertensive heart and chronic kidney disease with heart failure and stage 1 through stage 4 chronic kidney disease, or unspecified chronic kidney disease: Secondary | ICD-10-CM | POA: Diagnosis not present

## 2023-07-22 DIAGNOSIS — K219 Gastro-esophageal reflux disease without esophagitis: Secondary | ICD-10-CM | POA: Diagnosis not present

## 2023-07-22 DIAGNOSIS — Z7982 Long term (current) use of aspirin: Secondary | ICD-10-CM | POA: Diagnosis not present

## 2023-07-22 DIAGNOSIS — Z7984 Long term (current) use of oral hypoglycemic drugs: Secondary | ICD-10-CM | POA: Diagnosis not present

## 2023-07-22 DIAGNOSIS — M129 Arthropathy, unspecified: Secondary | ICD-10-CM | POA: Diagnosis not present

## 2023-07-22 DIAGNOSIS — J302 Other seasonal allergic rhinitis: Secondary | ICD-10-CM | POA: Diagnosis not present

## 2023-07-22 DIAGNOSIS — M19011 Primary osteoarthritis, right shoulder: Secondary | ICD-10-CM | POA: Diagnosis not present

## 2023-07-22 DIAGNOSIS — Z7952 Long term (current) use of systemic steroids: Secondary | ICD-10-CM | POA: Diagnosis not present

## 2023-07-22 DIAGNOSIS — E559 Vitamin D deficiency, unspecified: Secondary | ICD-10-CM | POA: Diagnosis not present

## 2023-07-22 DIAGNOSIS — N184 Chronic kidney disease, stage 4 (severe): Secondary | ICD-10-CM | POA: Diagnosis not present

## 2023-07-22 DIAGNOSIS — R7303 Prediabetes: Secondary | ICD-10-CM | POA: Diagnosis not present

## 2023-07-22 DIAGNOSIS — M47816 Spondylosis without myelopathy or radiculopathy, lumbar region: Secondary | ICD-10-CM | POA: Diagnosis not present

## 2023-07-22 DIAGNOSIS — G8929 Other chronic pain: Secondary | ICD-10-CM | POA: Diagnosis not present

## 2023-07-22 DIAGNOSIS — M109 Gout, unspecified: Secondary | ICD-10-CM | POA: Diagnosis not present

## 2023-07-22 DIAGNOSIS — I509 Heart failure, unspecified: Secondary | ICD-10-CM | POA: Diagnosis not present

## 2023-07-30 DIAGNOSIS — M7501 Adhesive capsulitis of right shoulder: Secondary | ICD-10-CM | POA: Diagnosis not present

## 2023-07-30 DIAGNOSIS — K219 Gastro-esophageal reflux disease without esophagitis: Secondary | ICD-10-CM | POA: Diagnosis not present

## 2023-07-30 DIAGNOSIS — M19012 Primary osteoarthritis, left shoulder: Secondary | ICD-10-CM | POA: Diagnosis not present

## 2023-07-30 DIAGNOSIS — M7502 Adhesive capsulitis of left shoulder: Secondary | ICD-10-CM | POA: Diagnosis not present

## 2023-07-30 DIAGNOSIS — R5383 Other fatigue: Secondary | ICD-10-CM | POA: Diagnosis not present

## 2023-07-30 DIAGNOSIS — M19011 Primary osteoarthritis, right shoulder: Secondary | ICD-10-CM | POA: Diagnosis not present

## 2023-07-30 DIAGNOSIS — R7303 Prediabetes: Secondary | ICD-10-CM | POA: Diagnosis not present

## 2023-07-30 DIAGNOSIS — E559 Vitamin D deficiency, unspecified: Secondary | ICD-10-CM | POA: Diagnosis not present

## 2023-07-30 DIAGNOSIS — Z7982 Long term (current) use of aspirin: Secondary | ICD-10-CM | POA: Diagnosis not present

## 2023-07-30 DIAGNOSIS — G8929 Other chronic pain: Secondary | ICD-10-CM | POA: Diagnosis not present

## 2023-07-30 DIAGNOSIS — N184 Chronic kidney disease, stage 4 (severe): Secondary | ICD-10-CM | POA: Diagnosis not present

## 2023-07-30 DIAGNOSIS — Z79899 Other long term (current) drug therapy: Secondary | ICD-10-CM | POA: Diagnosis not present

## 2023-07-30 DIAGNOSIS — J302 Other seasonal allergic rhinitis: Secondary | ICD-10-CM | POA: Diagnosis not present

## 2023-07-30 DIAGNOSIS — Z7984 Long term (current) use of oral hypoglycemic drugs: Secondary | ICD-10-CM | POA: Diagnosis not present

## 2023-07-30 DIAGNOSIS — I130 Hypertensive heart and chronic kidney disease with heart failure and stage 1 through stage 4 chronic kidney disease, or unspecified chronic kidney disease: Secondary | ICD-10-CM | POA: Diagnosis not present

## 2023-07-30 DIAGNOSIS — M47816 Spondylosis without myelopathy or radiculopathy, lumbar region: Secondary | ICD-10-CM | POA: Diagnosis not present

## 2023-07-30 DIAGNOSIS — E785 Hyperlipidemia, unspecified: Secondary | ICD-10-CM | POA: Diagnosis not present

## 2023-07-30 DIAGNOSIS — M129 Arthropathy, unspecified: Secondary | ICD-10-CM | POA: Diagnosis not present

## 2023-07-30 DIAGNOSIS — I509 Heart failure, unspecified: Secondary | ICD-10-CM | POA: Diagnosis not present

## 2023-07-30 DIAGNOSIS — Z7952 Long term (current) use of systemic steroids: Secondary | ICD-10-CM | POA: Diagnosis not present

## 2023-07-30 DIAGNOSIS — M109 Gout, unspecified: Secondary | ICD-10-CM | POA: Diagnosis not present

## 2023-08-02 ENCOUNTER — Ambulatory Visit (INDEPENDENT_AMBULATORY_CARE_PROVIDER_SITE_OTHER): Payer: Medicare HMO

## 2023-08-02 ENCOUNTER — Ambulatory Visit: Payer: Medicare HMO | Admitting: Internal Medicine

## 2023-08-02 ENCOUNTER — Encounter: Payer: Self-pay | Admitting: Internal Medicine

## 2023-08-02 VITALS — BP 122/66 | HR 50 | Temp 98.2°F | Ht 61.0 in | Wt 158.0 lb

## 2023-08-02 DIAGNOSIS — J4531 Mild persistent asthma with (acute) exacerbation: Secondary | ICD-10-CM

## 2023-08-02 DIAGNOSIS — J309 Allergic rhinitis, unspecified: Secondary | ICD-10-CM

## 2023-08-02 DIAGNOSIS — I10 Essential (primary) hypertension: Secondary | ICD-10-CM

## 2023-08-02 DIAGNOSIS — R062 Wheezing: Secondary | ICD-10-CM

## 2023-08-02 DIAGNOSIS — J984 Other disorders of lung: Secondary | ICD-10-CM | POA: Diagnosis not present

## 2023-08-02 DIAGNOSIS — N184 Chronic kidney disease, stage 4 (severe): Secondary | ICD-10-CM | POA: Diagnosis not present

## 2023-08-02 DIAGNOSIS — R918 Other nonspecific abnormal finding of lung field: Secondary | ICD-10-CM | POA: Diagnosis not present

## 2023-08-02 DIAGNOSIS — R0602 Shortness of breath: Secondary | ICD-10-CM | POA: Diagnosis not present

## 2023-08-02 MED ORDER — ALBUTEROL SULFATE HFA 108 (90 BASE) MCG/ACT IN AERS: 1.0000 | INHALATION_SPRAY | Freq: Four times a day (QID) | RESPIRATORY_TRACT | 5 refills | Status: DC | PRN

## 2023-08-02 MED ORDER — PREDNISONE 10 MG PO TABS: ORAL_TABLET | ORAL | 0 refills | Status: DC

## 2023-08-02 MED ORDER — METHYLPREDNISOLONE ACETATE 80 MG/ML IJ SUSP
80.0000 mg | Freq: Once | INTRAMUSCULAR | Status: AC
Start: 2023-08-02 — End: 2023-08-02
  Administered 2023-08-02: 80 mg via INTRAMUSCULAR

## 2023-08-02 NOTE — Assessment & Plan Note (Signed)
Mild to mod, also for prednisone taper, cont inhaler and wixhela,  to f/u any worsening symptoms or concerns , f/u pulm dec 2024 as planned

## 2023-08-02 NOTE — Assessment & Plan Note (Signed)
BP Readings from Last 3 Encounters:  08/02/23 122/66  06/25/23 128/78  06/08/23 (!) 152/83   Stable, pt to continue medical treatment norvasc 5 every day, losartan 100 every day, toprol xl 25 qd

## 2023-08-02 NOTE — Patient Instructions (Signed)
You had the steroid shot today  Please take all new medication as prescribed - the prednisone  Ok for refill of the Albuterol  Please continue all other medications as before, including the Wixhela  Please have the pharmacy call with any other refills you may need.  Please continue your efforts at being more active, low cholesterol diet, and weight control..  Please keep your appointments with your specialists as you may have planned - pulmonary in December  Please go to the XRAY Department in the first floor for the x-ray testing  You will be contacted by phone if any changes need to be made immediately.  Otherwise, you will receive a letter about your results with an explanation, but please check with MyChart first.  We can hold on lab testing today

## 2023-08-02 NOTE — Assessment & Plan Note (Signed)
Lab Results  Component Value Date   CREATININE 1.87 (H) 06/25/2023   Stable overall, cont to avoid nephrotoxins

## 2023-08-02 NOTE — Progress Notes (Signed)
Patient ID: [name], female   DOB: May 01, 1936, 87 y.o.   MRN: [id number]        Chief Complaint: follow up asthma allergies, htn, ckd3a       HPI:  [name] is a 87 y.o. female Does have several wks ongoing nasal allergy symptoms with clearish congestion, itch and sneezing, without fever, pain, ST, cough, swelling but has had also mild wheezing worse in the past 3 days as well.  Pt denies chest pain, orthopnea, PND, increased LE swelling, palpitations, dizziness or syncope.   Pt denies polydipsia, polyuria, or new focal neuro s/s.    Pt denies wt loss, night sweats, loss of appetite, or other constitutional symptoms         Wt Readings from Last 3 Encounters:  08/02/23 158 lb (71.7 kg)  06/25/23 156 lb (70.8 kg)  06/23/23 158 lb (71.7 kg)   BP Readings from Last 3 Encounters:  08/02/23 122/66  06/25/23 128/78  06/08/23 (!) 152/83         Past Medical History:  Diagnosis Date   Allergic rhinitis 10/30/2016   Anemia    Asthma    Breast cancer of upper-outer quadrant of left female breast (HCC) 11/08/2013   ER/PR+ Her2- Left IDC    Chronic renal insufficiency    Chronic rhinitis    Colon polyp    Diastolic dysfunction 07/10/2016   DJD (degenerative joint disease)    Dyspnea    Frozen shoulder    Full dentures    GERD (gastroesophageal reflux disease)    Hearing loss    Hypertension    Hyponatremia    Impaired glucose tolerance 07/18/2014   Memory loss    Morbid obesity (HCC)    Poor circulation    Vertigo    Wears glasses    Past Surgical History:  Procedure Laterality Date   ABDOMINAL HYSTERECTOMY     BREAST LUMPECTOMY WITH NEEDLE LOCALIZATION AND AXILLARY SENTINEL LYMPH NODE BX Left 12/04/2013   Procedure: BREAST LUMPECTOMY WITH NEEDLE LOCALIZATION AND AXILLARY SENTINEL LYMPH NODE BX;  Surgeon: Kandis Cocking, MD;  Location: [facility];  Service: General;  Laterality: Left;   CATARACT EXTRACTION  2009   rt   COLONOSCOPY     EYE  SURGERY Bilateral    cataract surgery   KNEE ARTHROSCOPY     both   LUMBAR LAMINECTOMY/DECOMPRESSION MICRODISCECTOMY Left 12/23/2017   Procedure: Left Lumbar One-Two Laminectomy with microdiscectomy;  Surgeon: Tia Alert, MD;  Location: [facility];  Service: Neurosurgery;  Laterality: Left;  Left L1-2 Laminectomy with microdiscectomy   TONSILLECTOMY     TOTAL KNEE ARTHROPLASTY  2002   rt   TOTAL KNEE ARTHROPLASTY  2003   left   VESICOVAGINAL FISTULA CLOSURE W/ TAH  1980    reports that she has never smoked. She has never been exposed to tobacco smoke. She has never used smokeless tobacco. She reports that she does not drink alcohol and does not use drugs. family history includes Colon cancer in her mother; Healthy in her daughter; Heart attack (age of onset: 9) in her son; Lung cancer in her brother; Stomach cancer in her mother. Allergies  Allergen Reactions   Hydrocodone Itching   Lasix [Furosemide] Other (See Comments)    Dizziness.    Tizanidine Other (See Comments)    Dizzy and fall   Iron Hives and Other (See Comments)     bad constipation    Current Outpatient Medications on  File Prior to Visit  Medication Sig Dispense Refill   allopurinol (ZYLOPRIM) 100 MG tablet Take 100 mg by mouth daily.     amLODipine (NORVASC) 5 MG tablet TAKE 1 TABLET BY MOUTH EVERY DAY (Patient taking differently: Take 5 mg by mouth every morning.) 90 tablet 3   aspirin EC 81 MG tablet Take 81 mg by mouth every morning. Swallow whole.     celecoxib (CELEBREX) 100 MG capsule Take 100 mg by mouth 2 (two) times daily.     Cholecalciferol (VITAMIN D3) 20 MCG (800 UNIT) TABS Take 1 tablet by mouth daily.     dapagliflozin propanediol (FARXIGA) 10 MG TABS tablet Take 1 tablet (10 mg total) by mouth daily before breakfast. 90 tablet 3   diclofenac Sodium (VOLTAREN) 1 % GEL Inhale 1 puff into the lungs 2 (two) times daily.     diclofenac Sodium (VOLTAREN) 1 % GEL APPLY 2 GRAMS TO AFFECTED AREA 4 TIMES A DAY  100 g 5   Ensure (ENSURE) Take 1 Can by mouth 3 (three) times daily between meals. (Patient taking differently: Take 237 mLs by mouth See admin instructions. Drink one can (237 mls) by mouth once or twice daily) 237 mL 12   febuxostat (ULORIC) 40 MG tablet Take 40 mg by mouth daily.     FLUAD QUADRIVALENT 0.5 ML injection      fluticasone-salmeterol (WIXELA INHUB) 500-50 MCG/ACT AEPB Inhale 1 puff into the lungs in the morning and at bedtime. 3 each 3   furosemide (LASIX) 40 MG tablet Take 1 tablet (40 mg total) by mouth every Monday, Wednesday, and Friday.     gabapentin (NEURONTIN) 300 MG capsule TAKE 1 CAPSULE BY MOUTH THREE TIMES A DAY 90 capsule 5   Incontinence Supply Disposable (DEPEND UNDERWEAR SM/MED) MISC Use as directed four times per day 120 each 5   lidocaine (LIDODERM) 5 % Place 1 patch onto the skin daily. Remove & Discard patch within 12 hours or as directed by MD 30 patch 0   losartan (COZAAR) 100 MG tablet Take 100 mg by mouth daily.     magnesium gluconate (MAGONATE) 500 MG tablet Take 1 tablet (500 mg total) by mouth 2 (two) times daily. 180 tablet 1   meclizine (ANTIVERT) 12.5 MG tablet TAKE 1 TABLET BY MOUTH THREE TIMES A DAY AS NEEDED FOR DIZZINESS (Patient taking differently: Take 12.5 mg by mouth 3 (three) times daily as needed for dizziness.) 90 tablet 2   metoprolol succinate (TOPROL-XL) 25 MG 24 hr tablet Take 1 tablet (25 mg total) by mouth daily. 90 tablet 3   montelukast (SINGULAIR) 10 MG tablet TAKE 1 TABLET BY MOUTH EVERY DAY 90 tablet 2   Multiple Vitamin (MULTIVITAMIN WITH MINERALS) TABS tablet Take 1 tablet by mouth daily. Centrum Silver     pantoprazole (PROTONIX) 40 MG tablet TAKE 1 TABLET BY MOUTH EVERY DAY 90 tablet 3   potassium chloride (KLOR-CON) 10 MEQ tablet TAKE 1 TABLET BY MOUTH EVERY DAY WHEN TAKING FUROSEMIDE (Patient taking differently: Take 10 mEq by mouth every Monday, Wednesday, and Friday.) 90 tablet 3   predniSONE (DELTASONE) 5 MG tablet Take 1  tablet (5 mg total) by mouth daily with breakfast. 90 tablet 3   SPIKEVAX syringe      triamcinolone cream (KENALOG) 0.1 % APPLY TO AFFECTED AREA TWICE A DAY 30 g 1   No current facility-administered medications on file prior to visit.        ROS:  All others  reviewed and negative.  Objective        PE:  BP 122/66 (BP Location: Right Arm, Patient Position: Sitting, Cuff Size: Normal)   Pulse (!) 50   Temp 98.2 F (36.8 C) (Oral)   Ht 5\' 1"  (1.549 m)   Wt 158 lb (71.7 kg)   SpO2 96%   BMI 29.85 kg/m                 Constitutional: Pt appears in NAD               HENT: Head: NCAT.                Right Ear: External ear normal.                 Left Ear: External ear normal.                Eyes: . Pupils are equal, round, and reactive to light. Conjunctivae and EOM are normal               Nose: without d/c or deformity               Neck: Neck supple. Gross normal ROM               Cardiovascular: Normal rate and regular rhythm.                 Pulmonary/Chest: Effort normal and breath sounds decreased without rales or overt wheezing.                Abd:  Soft, NT, ND, + BS, no organomegaly               Neurological: Pt is alert. At baseline orientation, motor grossly intact               Skin: Skin is warm. No rashes, no other new lesions, LE edema - trace bilateral               Psychiatric: Pt behavior is normal without agitation   Micro: none  Cardiac tracings I have personally interpreted today:  none  Pertinent Radiological findings (summarize): none   Lab Results  Component Value Date   WBC 8.6 06/25/2023   HGB 12.9 06/25/2023   HCT 40.6 06/25/2023   PLT 221.0 06/25/2023   GLUCOSE 106 (H) 06/25/2023   CHOL 231 (H) 06/25/2023   TRIG 198.0 (H) 06/25/2023   HDL 69.10 06/25/2023   LDLDIRECT 137.0 02/02/2022   LDLCALC 123 (H) 06/25/2023   ALT 34 06/25/2023   AST 28 06/25/2023   NA 142 06/25/2023   K 4.6 06/25/2023   CL 99 06/25/2023   CREATININE 1.87 (H)  06/25/2023   BUN 40 (H) 06/25/2023   CO2 34 (H) 06/25/2023   TSH 1.09 06/25/2023   INR 1.0 07/27/2021   HGBA1C 6.0 06/25/2023   Assessment/Plan:  Warda FATIMATA MURIN is a 87 y.o. Black or African American [2] female with  has a past medical history of Allergic rhinitis (10/30/2016), Anemia, Asthma, Breast cancer of upper-outer quadrant of left female breast (HCC) (11/08/2013), Chronic renal insufficiency, Chronic rhinitis, Colon polyp, Diastolic dysfunction (07/10/2016), DJD (degenerative joint disease), Dyspnea, Frozen shoulder, Full dentures, GERD (gastroesophageal reflux disease), Hearing loss, Hypertension, Hyponatremia, Impaired glucose tolerance (07/18/2014), Memory loss, Morbid obesity (HCC), Poor circulation, Vertigo, and Wears glasses.  Essential hypertension BP Readings from Last 3 Encounters:  08/02/23 122/66  06/25/23 128/78  06/08/23 (!) 152/83  Stable, pt to continue medical treatment norvasc 5 every day, losartan 100 every day, toprol xl 25 qd   CKD (chronic kidney disease) stage 4, GFR 15-29 ml/min (HCC) Lab Results  Component Value Date   CREATININE 1.87 (H) 06/25/2023   Stable overall, cont to avoid nephrotoxins   Allergic rhinitis Mild to mod, for depomedrol I'm 80 mg, also prednisone taper,  to f/u any worsening symptoms or concerns  Asthma exacerbation Mild to mod, also for prednisone taper, cont inhaler and wixhela,  to f/u any worsening symptoms or concerns , f/u pulm dec 2024 as planned  Followup: Return if symptoms worsen or fail to improve.  Oliver Barre, MD 08/02/2023 8:30 PM [facility] [place] Primary Care - [facility] Internal Medicine

## 2023-08-02 NOTE — Assessment & Plan Note (Signed)
Mild to mod, for depomedrol I'm 80 mg, also prednisone taper,  to f/u any worsening symptoms or concerns

## 2023-08-05 DIAGNOSIS — M25512 Pain in left shoulder: Secondary | ICD-10-CM | POA: Diagnosis not present

## 2023-08-05 DIAGNOSIS — M25511 Pain in right shoulder: Secondary | ICD-10-CM | POA: Diagnosis not present

## 2023-08-06 DIAGNOSIS — I130 Hypertensive heart and chronic kidney disease with heart failure and stage 1 through stage 4 chronic kidney disease, or unspecified chronic kidney disease: Secondary | ICD-10-CM | POA: Diagnosis not present

## 2023-08-06 DIAGNOSIS — M7502 Adhesive capsulitis of left shoulder: Secondary | ICD-10-CM | POA: Diagnosis not present

## 2023-08-06 DIAGNOSIS — R5383 Other fatigue: Secondary | ICD-10-CM | POA: Diagnosis not present

## 2023-08-06 DIAGNOSIS — N184 Chronic kidney disease, stage 4 (severe): Secondary | ICD-10-CM | POA: Diagnosis not present

## 2023-08-06 DIAGNOSIS — M19011 Primary osteoarthritis, right shoulder: Secondary | ICD-10-CM | POA: Diagnosis not present

## 2023-08-06 DIAGNOSIS — Z79899 Other long term (current) drug therapy: Secondary | ICD-10-CM | POA: Diagnosis not present

## 2023-08-06 DIAGNOSIS — M129 Arthropathy, unspecified: Secondary | ICD-10-CM | POA: Diagnosis not present

## 2023-08-06 DIAGNOSIS — E559 Vitamin D deficiency, unspecified: Secondary | ICD-10-CM | POA: Diagnosis not present

## 2023-08-06 DIAGNOSIS — M109 Gout, unspecified: Secondary | ICD-10-CM | POA: Diagnosis not present

## 2023-08-06 DIAGNOSIS — I509 Heart failure, unspecified: Secondary | ICD-10-CM | POA: Diagnosis not present

## 2023-08-06 DIAGNOSIS — M19012 Primary osteoarthritis, left shoulder: Secondary | ICD-10-CM | POA: Diagnosis not present

## 2023-08-06 DIAGNOSIS — G8929 Other chronic pain: Secondary | ICD-10-CM | POA: Diagnosis not present

## 2023-08-06 DIAGNOSIS — J302 Other seasonal allergic rhinitis: Secondary | ICD-10-CM | POA: Diagnosis not present

## 2023-08-06 DIAGNOSIS — E785 Hyperlipidemia, unspecified: Secondary | ICD-10-CM | POA: Diagnosis not present

## 2023-08-06 DIAGNOSIS — Z7982 Long term (current) use of aspirin: Secondary | ICD-10-CM | POA: Diagnosis not present

## 2023-08-06 DIAGNOSIS — Z7952 Long term (current) use of systemic steroids: Secondary | ICD-10-CM | POA: Diagnosis not present

## 2023-08-06 DIAGNOSIS — M7501 Adhesive capsulitis of right shoulder: Secondary | ICD-10-CM | POA: Diagnosis not present

## 2023-08-06 DIAGNOSIS — M47816 Spondylosis without myelopathy or radiculopathy, lumbar region: Secondary | ICD-10-CM | POA: Diagnosis not present

## 2023-08-06 DIAGNOSIS — K219 Gastro-esophageal reflux disease without esophagitis: Secondary | ICD-10-CM | POA: Diagnosis not present

## 2023-08-06 DIAGNOSIS — Z7984 Long term (current) use of oral hypoglycemic drugs: Secondary | ICD-10-CM | POA: Diagnosis not present

## 2023-08-06 DIAGNOSIS — R7303 Prediabetes: Secondary | ICD-10-CM | POA: Diagnosis not present

## 2023-08-13 DIAGNOSIS — Z7984 Long term (current) use of oral hypoglycemic drugs: Secondary | ICD-10-CM | POA: Diagnosis not present

## 2023-08-13 DIAGNOSIS — Z7982 Long term (current) use of aspirin: Secondary | ICD-10-CM | POA: Diagnosis not present

## 2023-08-13 DIAGNOSIS — K219 Gastro-esophageal reflux disease without esophagitis: Secondary | ICD-10-CM | POA: Diagnosis not present

## 2023-08-13 DIAGNOSIS — N184 Chronic kidney disease, stage 4 (severe): Secondary | ICD-10-CM | POA: Diagnosis not present

## 2023-08-13 DIAGNOSIS — R7303 Prediabetes: Secondary | ICD-10-CM | POA: Diagnosis not present

## 2023-08-13 DIAGNOSIS — M7501 Adhesive capsulitis of right shoulder: Secondary | ICD-10-CM | POA: Diagnosis not present

## 2023-08-13 DIAGNOSIS — M129 Arthropathy, unspecified: Secondary | ICD-10-CM | POA: Diagnosis not present

## 2023-08-13 DIAGNOSIS — M47816 Spondylosis without myelopathy or radiculopathy, lumbar region: Secondary | ICD-10-CM | POA: Diagnosis not present

## 2023-08-13 DIAGNOSIS — M109 Gout, unspecified: Secondary | ICD-10-CM | POA: Diagnosis not present

## 2023-08-13 DIAGNOSIS — Z79899 Other long term (current) drug therapy: Secondary | ICD-10-CM | POA: Diagnosis not present

## 2023-08-13 DIAGNOSIS — M7502 Adhesive capsulitis of left shoulder: Secondary | ICD-10-CM | POA: Diagnosis not present

## 2023-08-13 DIAGNOSIS — Z7952 Long term (current) use of systemic steroids: Secondary | ICD-10-CM | POA: Diagnosis not present

## 2023-08-13 DIAGNOSIS — R5383 Other fatigue: Secondary | ICD-10-CM | POA: Diagnosis not present

## 2023-08-13 DIAGNOSIS — E785 Hyperlipidemia, unspecified: Secondary | ICD-10-CM | POA: Diagnosis not present

## 2023-08-13 DIAGNOSIS — I130 Hypertensive heart and chronic kidney disease with heart failure and stage 1 through stage 4 chronic kidney disease, or unspecified chronic kidney disease: Secondary | ICD-10-CM | POA: Diagnosis not present

## 2023-08-13 DIAGNOSIS — J302 Other seasonal allergic rhinitis: Secondary | ICD-10-CM | POA: Diagnosis not present

## 2023-08-13 DIAGNOSIS — I509 Heart failure, unspecified: Secondary | ICD-10-CM | POA: Diagnosis not present

## 2023-08-13 DIAGNOSIS — M19012 Primary osteoarthritis, left shoulder: Secondary | ICD-10-CM | POA: Diagnosis not present

## 2023-08-13 DIAGNOSIS — E559 Vitamin D deficiency, unspecified: Secondary | ICD-10-CM | POA: Diagnosis not present

## 2023-08-13 DIAGNOSIS — M19011 Primary osteoarthritis, right shoulder: Secondary | ICD-10-CM | POA: Diagnosis not present

## 2023-08-13 DIAGNOSIS — G8929 Other chronic pain: Secondary | ICD-10-CM | POA: Diagnosis not present

## 2023-08-18 DIAGNOSIS — I130 Hypertensive heart and chronic kidney disease with heart failure and stage 1 through stage 4 chronic kidney disease, or unspecified chronic kidney disease: Secondary | ICD-10-CM | POA: Diagnosis not present

## 2023-08-18 DIAGNOSIS — M7501 Adhesive capsulitis of right shoulder: Secondary | ICD-10-CM | POA: Diagnosis not present

## 2023-08-18 DIAGNOSIS — M109 Gout, unspecified: Secondary | ICD-10-CM | POA: Diagnosis not present

## 2023-08-18 DIAGNOSIS — R5383 Other fatigue: Secondary | ICD-10-CM | POA: Diagnosis not present

## 2023-08-18 DIAGNOSIS — I509 Heart failure, unspecified: Secondary | ICD-10-CM | POA: Diagnosis not present

## 2023-08-18 DIAGNOSIS — Z7982 Long term (current) use of aspirin: Secondary | ICD-10-CM | POA: Diagnosis not present

## 2023-08-18 DIAGNOSIS — Z7952 Long term (current) use of systemic steroids: Secondary | ICD-10-CM | POA: Diagnosis not present

## 2023-08-18 DIAGNOSIS — M47816 Spondylosis without myelopathy or radiculopathy, lumbar region: Secondary | ICD-10-CM | POA: Diagnosis not present

## 2023-08-18 DIAGNOSIS — N184 Chronic kidney disease, stage 4 (severe): Secondary | ICD-10-CM | POA: Diagnosis not present

## 2023-08-18 DIAGNOSIS — Z79899 Other long term (current) drug therapy: Secondary | ICD-10-CM | POA: Diagnosis not present

## 2023-08-18 DIAGNOSIS — E559 Vitamin D deficiency, unspecified: Secondary | ICD-10-CM | POA: Diagnosis not present

## 2023-08-18 DIAGNOSIS — M19011 Primary osteoarthritis, right shoulder: Secondary | ICD-10-CM | POA: Diagnosis not present

## 2023-08-18 DIAGNOSIS — R7303 Prediabetes: Secondary | ICD-10-CM | POA: Diagnosis not present

## 2023-08-18 DIAGNOSIS — M7502 Adhesive capsulitis of left shoulder: Secondary | ICD-10-CM | POA: Diagnosis not present

## 2023-08-18 DIAGNOSIS — G8929 Other chronic pain: Secondary | ICD-10-CM | POA: Diagnosis not present

## 2023-08-18 DIAGNOSIS — M129 Arthropathy, unspecified: Secondary | ICD-10-CM | POA: Diagnosis not present

## 2023-08-18 DIAGNOSIS — E785 Hyperlipidemia, unspecified: Secondary | ICD-10-CM | POA: Diagnosis not present

## 2023-08-18 DIAGNOSIS — Z7984 Long term (current) use of oral hypoglycemic drugs: Secondary | ICD-10-CM | POA: Diagnosis not present

## 2023-08-18 DIAGNOSIS — J302 Other seasonal allergic rhinitis: Secondary | ICD-10-CM | POA: Diagnosis not present

## 2023-08-18 DIAGNOSIS — K219 Gastro-esophageal reflux disease without esophagitis: Secondary | ICD-10-CM | POA: Diagnosis not present

## 2023-08-18 DIAGNOSIS — M19012 Primary osteoarthritis, left shoulder: Secondary | ICD-10-CM | POA: Diagnosis not present

## 2023-08-23 ENCOUNTER — Telehealth: Payer: Self-pay | Admitting: Pulmonary Disease

## 2023-08-23 NOTE — Telephone Encounter (Signed)
Pt calling in about her inhalers

## 2023-08-24 ENCOUNTER — Encounter: Payer: Self-pay | Admitting: Internal Medicine

## 2023-08-24 ENCOUNTER — Other Ambulatory Visit: Payer: Self-pay | Admitting: Internal Medicine

## 2023-08-24 ENCOUNTER — Ambulatory Visit (INDEPENDENT_AMBULATORY_CARE_PROVIDER_SITE_OTHER): Payer: Medicare HMO | Admitting: Internal Medicine

## 2023-08-24 VITALS — BP 122/68 | HR 52 | Temp 99.1°F | Ht 61.0 in | Wt 155.0 lb

## 2023-08-24 DIAGNOSIS — I951 Orthostatic hypotension: Secondary | ICD-10-CM | POA: Diagnosis not present

## 2023-08-24 DIAGNOSIS — J4531 Mild persistent asthma with (acute) exacerbation: Secondary | ICD-10-CM | POA: Diagnosis not present

## 2023-08-24 DIAGNOSIS — J309 Allergic rhinitis, unspecified: Secondary | ICD-10-CM | POA: Diagnosis not present

## 2023-08-24 DIAGNOSIS — I10 Essential (primary) hypertension: Secondary | ICD-10-CM | POA: Diagnosis not present

## 2023-08-24 DIAGNOSIS — R7302 Impaired glucose tolerance (oral): Secondary | ICD-10-CM | POA: Diagnosis not present

## 2023-08-24 DIAGNOSIS — N184 Chronic kidney disease, stage 4 (severe): Secondary | ICD-10-CM

## 2023-08-24 DIAGNOSIS — E7800 Pure hypercholesterolemia, unspecified: Secondary | ICD-10-CM | POA: Diagnosis not present

## 2023-08-24 DIAGNOSIS — E559 Vitamin D deficiency, unspecified: Secondary | ICD-10-CM

## 2023-08-24 DIAGNOSIS — E538 Deficiency of other specified B group vitamins: Secondary | ICD-10-CM

## 2023-08-24 LAB — BASIC METABOLIC PANEL
BUN: 67 mg/dL — ABNORMAL HIGH (ref 6–23)
CO2: 34 meq/L — ABNORMAL HIGH (ref 19–32)
Calcium: 9.9 mg/dL (ref 8.4–10.5)
Chloride: 99 meq/L (ref 96–112)
Creatinine, Ser: 2.37 mg/dL — ABNORMAL HIGH (ref 0.40–1.20)
GFR: 18.2 mL/min — ABNORMAL LOW (ref >60.00)
Glucose, Bld: 86 mg/dL (ref 70–99)
Potassium: 4.2 meq/L (ref 3.5–5.1)
Sodium: 140 meq/L (ref 135–145)

## 2023-08-24 LAB — HEPATIC FUNCTION PANEL
ALT: 27 U/L (ref 0–35)
AST: 25 U/L (ref 0–37)
Albumin: 4 g/dL (ref 3.5–5.2)
Alkaline Phosphatase: 49 U/L (ref 39–117)
Bilirubin, Direct: 0.1 mg/dL (ref 0.0–0.3)
Total Bilirubin: 0.5 mg/dL (ref 0.2–1.2)
Total Protein: 6.7 g/dL (ref 6.0–8.3)

## 2023-08-24 LAB — URINALYSIS, ROUTINE W REFLEX MICROSCOPIC
Bilirubin Urine: NEGATIVE
Hgb urine dipstick: NEGATIVE
Ketones, ur: NEGATIVE
Nitrite: NEGATIVE
Specific Gravity, Urine: 1.01 (ref 1.000–1.030)
Total Protein, Urine: NEGATIVE
Urine Glucose: 250 — AB
Urobilinogen, UA: 0.2 (ref 0.0–1.0)
pH: 6 (ref 5.0–8.0)

## 2023-08-24 LAB — CBC WITH DIFFERENTIAL/PLATELET
Basophils Absolute: 0 10*3/uL (ref 0.0–0.1)
Basophils Relative: 0.7 % (ref 0.0–3.0)
Eosinophils Absolute: 0.3 10*3/uL (ref 0.0–0.7)
Eosinophils Relative: 4.4 % (ref 0.0–5.0)
HCT: 37.9 % (ref 36.0–46.0)
Hemoglobin: 12 g/dL (ref 12.0–15.0)
Lymphocytes Relative: 24 % (ref 12.0–46.0)
Lymphs Abs: 1.8 10*3/uL (ref 0.7–4.0)
MCHC: 31.8 g/dL (ref 30.0–36.0)
MCV: 105.3 fL — ABNORMAL HIGH (ref 78.0–100.0)
Monocytes Absolute: 1 10*3/uL (ref 0.1–1.0)
Monocytes Relative: 13.9 % — ABNORMAL HIGH (ref 3.0–12.0)
Neutro Abs: 4.3 10*3/uL (ref 1.4–7.7)
Neutrophils Relative %: 57 % (ref 43.0–77.0)
Platelets: 205 10*3/uL (ref 150.0–400.0)
RBC: 3.6 Mil/uL — ABNORMAL LOW (ref 3.87–5.11)
RDW: 13.6 % (ref 11.5–15.5)
WBC: 7.5 10*3/uL (ref 4.0–10.5)

## 2023-08-24 LAB — LIPID PANEL
Cholesterol: 219 mg/dL — ABNORMAL HIGH (ref 0–200)
HDL: 57.9 mg/dL (ref >39.00)
LDL Cholesterol: 131 mg/dL — ABNORMAL HIGH (ref 0–99)
NonHDL: 161.26
Total CHOL/HDL Ratio: 4
Triglycerides: 153 mg/dL — ABNORMAL HIGH (ref 0.0–149.0)
VLDL: 30.6 mg/dL (ref 0.0–40.0)

## 2023-08-24 LAB — HEMOGLOBIN A1C: Hgb A1c MFr Bld: 6.4 % (ref 4.6–6.5)

## 2023-08-24 MED ORDER — PREDNISONE 10 MG PO TABS: ORAL_TABLET | ORAL | 0 refills | Status: DC

## 2023-08-24 MED ORDER — ALBUTEROL SULFATE HFA 108 (90 BASE) MCG/ACT IN AERS: 1.0000 | INHALATION_SPRAY | Freq: Four times a day (QID) | RESPIRATORY_TRACT | 5 refills | Status: AC | PRN

## 2023-08-24 MED ORDER — METHYLPREDNISOLONE ACETATE 80 MG/ML IJ SUSP
80.0000 mg | Freq: Once | INTRAMUSCULAR | Status: AC
  Administered 2023-08-24: 80 mg via INTRAMUSCULAR

## 2023-08-24 NOTE — Patient Instructions (Addendum)
You had the steroid shot today  Please take all new medication as prescribed- the prednisone  Ok to HOLD lasix completely until Sat Nov 2  On that day - restart the lasix taking 40 mg every AM, BUT also 40 mg in the PM on Mon- Wed-Friday  Please continue all other medications as before, and refills have been done if requested.  Please have the pharmacy call with any other refills you may need.  Please continue your efforts at being more active, low cholesterol diet, and weight control.  Please keep your appointments with your specialists as you may have planned  - Renal in 2 more months  Please go to the LAB at the blood drawing area for the tests to be done  [facility] to cancel the appt for Nov 22  You will be contacted by phone if any changes need to be made immediately.  Otherwise, you will receive a letter about your results with an explanation, but please check with MyChart first.

## 2023-08-25 NOTE — Telephone Encounter (Signed)
Both Wixela and Albuterol inhaler refilled by patient pcp. Nothing further needed.

## 2023-08-27 DIAGNOSIS — I130 Hypertensive heart and chronic kidney disease with heart failure and stage 1 through stage 4 chronic kidney disease, or unspecified chronic kidney disease: Secondary | ICD-10-CM | POA: Diagnosis not present

## 2023-08-27 DIAGNOSIS — I509 Heart failure, unspecified: Secondary | ICD-10-CM | POA: Diagnosis not present

## 2023-08-27 DIAGNOSIS — Z79899 Other long term (current) drug therapy: Secondary | ICD-10-CM | POA: Diagnosis not present

## 2023-08-27 DIAGNOSIS — M19012 Primary osteoarthritis, left shoulder: Secondary | ICD-10-CM | POA: Diagnosis not present

## 2023-08-27 DIAGNOSIS — E785 Hyperlipidemia, unspecified: Secondary | ICD-10-CM | POA: Diagnosis not present

## 2023-08-27 DIAGNOSIS — G8929 Other chronic pain: Secondary | ICD-10-CM | POA: Diagnosis not present

## 2023-08-27 DIAGNOSIS — Z7952 Long term (current) use of systemic steroids: Secondary | ICD-10-CM | POA: Diagnosis not present

## 2023-08-27 DIAGNOSIS — M129 Arthropathy, unspecified: Secondary | ICD-10-CM | POA: Diagnosis not present

## 2023-08-27 DIAGNOSIS — M109 Gout, unspecified: Secondary | ICD-10-CM | POA: Diagnosis not present

## 2023-08-27 DIAGNOSIS — J302 Other seasonal allergic rhinitis: Secondary | ICD-10-CM | POA: Diagnosis not present

## 2023-08-27 DIAGNOSIS — Z7982 Long term (current) use of aspirin: Secondary | ICD-10-CM | POA: Diagnosis not present

## 2023-08-27 DIAGNOSIS — M7502 Adhesive capsulitis of left shoulder: Secondary | ICD-10-CM | POA: Diagnosis not present

## 2023-08-27 DIAGNOSIS — R5383 Other fatigue: Secondary | ICD-10-CM | POA: Diagnosis not present

## 2023-08-27 DIAGNOSIS — Z7984 Long term (current) use of oral hypoglycemic drugs: Secondary | ICD-10-CM | POA: Diagnosis not present

## 2023-08-27 DIAGNOSIS — M19011 Primary osteoarthritis, right shoulder: Secondary | ICD-10-CM | POA: Diagnosis not present

## 2023-08-27 DIAGNOSIS — M7501 Adhesive capsulitis of right shoulder: Secondary | ICD-10-CM | POA: Diagnosis not present

## 2023-08-27 DIAGNOSIS — E559 Vitamin D deficiency, unspecified: Secondary | ICD-10-CM | POA: Diagnosis not present

## 2023-08-27 DIAGNOSIS — K219 Gastro-esophageal reflux disease without esophagitis: Secondary | ICD-10-CM | POA: Diagnosis not present

## 2023-08-27 DIAGNOSIS — R7303 Prediabetes: Secondary | ICD-10-CM | POA: Diagnosis not present

## 2023-08-27 DIAGNOSIS — M47816 Spondylosis without myelopathy or radiculopathy, lumbar region: Secondary | ICD-10-CM | POA: Diagnosis not present

## 2023-08-27 DIAGNOSIS — N184 Chronic kidney disease, stage 4 (severe): Secondary | ICD-10-CM | POA: Diagnosis not present

## 2023-08-28 ENCOUNTER — Encounter: Payer: Self-pay | Admitting: Internal Medicine

## 2023-08-28 DIAGNOSIS — I951 Orthostatic hypotension: Secondary | ICD-10-CM | POA: Insufficient documentation

## 2023-08-28 NOTE — Assessment & Plan Note (Signed)
Mild to mod, for depomedrol im80 mg,, prednisone taper  to f/u any worsening symptoms or concerns

## 2023-08-28 NOTE — Assessment & Plan Note (Signed)
Lab Results  Component Value Date   VITAMINB12 >1501 (H) 06/25/2023   Stable, cont oral replacement - b12 1000 mcg qd

## 2023-08-28 NOTE — Assessment & Plan Note (Signed)
Last vitamin D Lab Results  Component Value Date   VD25OH 79.47 06/25/2023   Stable, cont oral replacement

## 2023-08-28 NOTE — Assessment & Plan Note (Signed)
Most likely volume related after increased lasix; ok to hold lasix completely to nov 2, then restart with lasix 40 every day but 40 qpm on mon-wed - fri only

## 2023-08-28 NOTE — Assessment & Plan Note (Signed)
Lab Results  Component Value Date   HGBA1C 6.4 08/24/2023   Stable, pt to continue current medical treatment  - diet,wt control

## 2023-08-28 NOTE — Assessment & Plan Note (Signed)
Lab Results  Component Value Date   CREATININE 2.37 (H) 08/24/2023   Stable overall, cont to avoid nephrotoxins

## 2023-08-28 NOTE — Assessment & Plan Note (Signed)
BP Readings from Last 3 Encounters:  08/24/23 122/68  08/02/23 122/66  06/25/23 128/78   Stable, pt to continue medical treatment norvasc 5 mg every day, toprolx l 25 every day, losartan 100 qd

## 2023-09-02 ENCOUNTER — Other Ambulatory Visit (INDEPENDENT_AMBULATORY_CARE_PROVIDER_SITE_OTHER): Payer: Medicare HMO

## 2023-09-02 DIAGNOSIS — N184 Chronic kidney disease, stage 4 (severe): Secondary | ICD-10-CM

## 2023-09-02 LAB — BASIC METABOLIC PANEL
BUN: 31 mg/dL — ABNORMAL HIGH (ref 6–23)
CO2: 33 meq/L — ABNORMAL HIGH (ref 19–32)
Calcium: 9.8 mg/dL (ref 8.4–10.5)
Chloride: 102 meq/L (ref 96–112)
Creatinine, Ser: 1.81 mg/dL — ABNORMAL HIGH (ref 0.40–1.20)
GFR: 25.15 mL/min — ABNORMAL LOW (ref >60.00)
Glucose, Bld: 148 mg/dL — ABNORMAL HIGH (ref 70–99)
Potassium: 4.1 meq/L (ref 3.5–5.1)
Sodium: 141 meq/L (ref 135–145)

## 2023-09-03 DIAGNOSIS — J302 Other seasonal allergic rhinitis: Secondary | ICD-10-CM | POA: Diagnosis not present

## 2023-09-03 DIAGNOSIS — K219 Gastro-esophageal reflux disease without esophagitis: Secondary | ICD-10-CM | POA: Diagnosis not present

## 2023-09-03 DIAGNOSIS — M129 Arthropathy, unspecified: Secondary | ICD-10-CM | POA: Diagnosis not present

## 2023-09-03 DIAGNOSIS — Z7984 Long term (current) use of oral hypoglycemic drugs: Secondary | ICD-10-CM | POA: Diagnosis not present

## 2023-09-03 DIAGNOSIS — N184 Chronic kidney disease, stage 4 (severe): Secondary | ICD-10-CM | POA: Diagnosis not present

## 2023-09-03 DIAGNOSIS — Z79899 Other long term (current) drug therapy: Secondary | ICD-10-CM | POA: Diagnosis not present

## 2023-09-03 DIAGNOSIS — R7303 Prediabetes: Secondary | ICD-10-CM | POA: Diagnosis not present

## 2023-09-03 DIAGNOSIS — Z7952 Long term (current) use of systemic steroids: Secondary | ICD-10-CM | POA: Diagnosis not present

## 2023-09-03 DIAGNOSIS — E559 Vitamin D deficiency, unspecified: Secondary | ICD-10-CM | POA: Diagnosis not present

## 2023-09-03 DIAGNOSIS — G8929 Other chronic pain: Secondary | ICD-10-CM | POA: Diagnosis not present

## 2023-09-03 DIAGNOSIS — I130 Hypertensive heart and chronic kidney disease with heart failure and stage 1 through stage 4 chronic kidney disease, or unspecified chronic kidney disease: Secondary | ICD-10-CM | POA: Diagnosis not present

## 2023-09-03 DIAGNOSIS — Z7982 Long term (current) use of aspirin: Secondary | ICD-10-CM | POA: Diagnosis not present

## 2023-09-03 DIAGNOSIS — M47816 Spondylosis without myelopathy or radiculopathy, lumbar region: Secondary | ICD-10-CM | POA: Diagnosis not present

## 2023-09-03 DIAGNOSIS — M7502 Adhesive capsulitis of left shoulder: Secondary | ICD-10-CM | POA: Diagnosis not present

## 2023-09-03 DIAGNOSIS — M19012 Primary osteoarthritis, left shoulder: Secondary | ICD-10-CM | POA: Diagnosis not present

## 2023-09-03 DIAGNOSIS — M19011 Primary osteoarthritis, right shoulder: Secondary | ICD-10-CM | POA: Diagnosis not present

## 2023-09-03 DIAGNOSIS — E785 Hyperlipidemia, unspecified: Secondary | ICD-10-CM | POA: Diagnosis not present

## 2023-09-03 DIAGNOSIS — I509 Heart failure, unspecified: Secondary | ICD-10-CM | POA: Diagnosis not present

## 2023-09-03 DIAGNOSIS — R5383 Other fatigue: Secondary | ICD-10-CM | POA: Diagnosis not present

## 2023-09-03 DIAGNOSIS — M7501 Adhesive capsulitis of right shoulder: Secondary | ICD-10-CM | POA: Diagnosis not present

## 2023-09-03 DIAGNOSIS — M109 Gout, unspecified: Secondary | ICD-10-CM | POA: Diagnosis not present

## 2023-09-05 DIAGNOSIS — M25511 Pain in right shoulder: Secondary | ICD-10-CM | POA: Diagnosis not present

## 2023-09-05 DIAGNOSIS — M25512 Pain in left shoulder: Secondary | ICD-10-CM | POA: Diagnosis not present

## 2023-09-10 DIAGNOSIS — M7502 Adhesive capsulitis of left shoulder: Secondary | ICD-10-CM | POA: Diagnosis not present

## 2023-09-10 DIAGNOSIS — E559 Vitamin D deficiency, unspecified: Secondary | ICD-10-CM | POA: Diagnosis not present

## 2023-09-10 DIAGNOSIS — M19012 Primary osteoarthritis, left shoulder: Secondary | ICD-10-CM | POA: Diagnosis not present

## 2023-09-10 DIAGNOSIS — R5383 Other fatigue: Secondary | ICD-10-CM | POA: Diagnosis not present

## 2023-09-10 DIAGNOSIS — E785 Hyperlipidemia, unspecified: Secondary | ICD-10-CM | POA: Diagnosis not present

## 2023-09-10 DIAGNOSIS — G8929 Other chronic pain: Secondary | ICD-10-CM | POA: Diagnosis not present

## 2023-09-10 DIAGNOSIS — N184 Chronic kidney disease, stage 4 (severe): Secondary | ICD-10-CM | POA: Diagnosis not present

## 2023-09-10 DIAGNOSIS — Z7952 Long term (current) use of systemic steroids: Secondary | ICD-10-CM | POA: Diagnosis not present

## 2023-09-10 DIAGNOSIS — R7303 Prediabetes: Secondary | ICD-10-CM | POA: Diagnosis not present

## 2023-09-10 DIAGNOSIS — M7501 Adhesive capsulitis of right shoulder: Secondary | ICD-10-CM | POA: Diagnosis not present

## 2023-09-10 DIAGNOSIS — M129 Arthropathy, unspecified: Secondary | ICD-10-CM | POA: Diagnosis not present

## 2023-09-10 DIAGNOSIS — I130 Hypertensive heart and chronic kidney disease with heart failure and stage 1 through stage 4 chronic kidney disease, or unspecified chronic kidney disease: Secondary | ICD-10-CM | POA: Diagnosis not present

## 2023-09-10 DIAGNOSIS — K219 Gastro-esophageal reflux disease without esophagitis: Secondary | ICD-10-CM | POA: Diagnosis not present

## 2023-09-10 DIAGNOSIS — Z7984 Long term (current) use of oral hypoglycemic drugs: Secondary | ICD-10-CM | POA: Diagnosis not present

## 2023-09-10 DIAGNOSIS — Z7982 Long term (current) use of aspirin: Secondary | ICD-10-CM | POA: Diagnosis not present

## 2023-09-10 DIAGNOSIS — I509 Heart failure, unspecified: Secondary | ICD-10-CM | POA: Diagnosis not present

## 2023-09-10 DIAGNOSIS — M19011 Primary osteoarthritis, right shoulder: Secondary | ICD-10-CM | POA: Diagnosis not present

## 2023-09-10 DIAGNOSIS — M109 Gout, unspecified: Secondary | ICD-10-CM | POA: Diagnosis not present

## 2023-09-10 DIAGNOSIS — Z79899 Other long term (current) drug therapy: Secondary | ICD-10-CM | POA: Diagnosis not present

## 2023-09-10 DIAGNOSIS — M47816 Spondylosis without myelopathy or radiculopathy, lumbar region: Secondary | ICD-10-CM | POA: Diagnosis not present

## 2023-09-10 DIAGNOSIS — J302 Other seasonal allergic rhinitis: Secondary | ICD-10-CM | POA: Diagnosis not present

## 2023-09-17 ENCOUNTER — Ambulatory Visit: Payer: Medicare HMO | Admitting: Internal Medicine

## 2023-09-17 DIAGNOSIS — R7303 Prediabetes: Secondary | ICD-10-CM | POA: Diagnosis not present

## 2023-09-17 DIAGNOSIS — M109 Gout, unspecified: Secondary | ICD-10-CM | POA: Diagnosis not present

## 2023-09-17 DIAGNOSIS — Z7952 Long term (current) use of systemic steroids: Secondary | ICD-10-CM | POA: Diagnosis not present

## 2023-09-17 DIAGNOSIS — E785 Hyperlipidemia, unspecified: Secondary | ICD-10-CM | POA: Diagnosis not present

## 2023-09-17 DIAGNOSIS — E559 Vitamin D deficiency, unspecified: Secondary | ICD-10-CM | POA: Diagnosis not present

## 2023-09-17 DIAGNOSIS — K219 Gastro-esophageal reflux disease without esophagitis: Secondary | ICD-10-CM | POA: Diagnosis not present

## 2023-09-17 DIAGNOSIS — G8929 Other chronic pain: Secondary | ICD-10-CM | POA: Diagnosis not present

## 2023-09-17 DIAGNOSIS — M7502 Adhesive capsulitis of left shoulder: Secondary | ICD-10-CM | POA: Diagnosis not present

## 2023-09-17 DIAGNOSIS — J302 Other seasonal allergic rhinitis: Secondary | ICD-10-CM | POA: Diagnosis not present

## 2023-09-17 DIAGNOSIS — Z79899 Other long term (current) drug therapy: Secondary | ICD-10-CM | POA: Diagnosis not present

## 2023-09-17 DIAGNOSIS — M129 Arthropathy, unspecified: Secondary | ICD-10-CM | POA: Diagnosis not present

## 2023-09-17 DIAGNOSIS — Z7982 Long term (current) use of aspirin: Secondary | ICD-10-CM | POA: Diagnosis not present

## 2023-09-17 DIAGNOSIS — M19011 Primary osteoarthritis, right shoulder: Secondary | ICD-10-CM | POA: Diagnosis not present

## 2023-09-17 DIAGNOSIS — M19012 Primary osteoarthritis, left shoulder: Secondary | ICD-10-CM | POA: Diagnosis not present

## 2023-09-17 DIAGNOSIS — Z7984 Long term (current) use of oral hypoglycemic drugs: Secondary | ICD-10-CM | POA: Diagnosis not present

## 2023-09-17 DIAGNOSIS — M47816 Spondylosis without myelopathy or radiculopathy, lumbar region: Secondary | ICD-10-CM | POA: Diagnosis not present

## 2023-09-17 DIAGNOSIS — I509 Heart failure, unspecified: Secondary | ICD-10-CM | POA: Diagnosis not present

## 2023-09-17 DIAGNOSIS — I130 Hypertensive heart and chronic kidney disease with heart failure and stage 1 through stage 4 chronic kidney disease, or unspecified chronic kidney disease: Secondary | ICD-10-CM | POA: Diagnosis not present

## 2023-09-17 DIAGNOSIS — R5383 Other fatigue: Secondary | ICD-10-CM | POA: Diagnosis not present

## 2023-09-17 DIAGNOSIS — M7501 Adhesive capsulitis of right shoulder: Secondary | ICD-10-CM | POA: Diagnosis not present

## 2023-09-17 DIAGNOSIS — N184 Chronic kidney disease, stage 4 (severe): Secondary | ICD-10-CM | POA: Diagnosis not present

## 2023-09-22 DIAGNOSIS — M7502 Adhesive capsulitis of left shoulder: Secondary | ICD-10-CM | POA: Diagnosis not present

## 2023-09-22 DIAGNOSIS — Z79899 Other long term (current) drug therapy: Secondary | ICD-10-CM | POA: Diagnosis not present

## 2023-09-22 DIAGNOSIS — Z7982 Long term (current) use of aspirin: Secondary | ICD-10-CM | POA: Diagnosis not present

## 2023-09-22 DIAGNOSIS — I509 Heart failure, unspecified: Secondary | ICD-10-CM | POA: Diagnosis not present

## 2023-09-22 DIAGNOSIS — K219 Gastro-esophageal reflux disease without esophagitis: Secondary | ICD-10-CM | POA: Diagnosis not present

## 2023-09-22 DIAGNOSIS — I130 Hypertensive heart and chronic kidney disease with heart failure and stage 1 through stage 4 chronic kidney disease, or unspecified chronic kidney disease: Secondary | ICD-10-CM | POA: Diagnosis not present

## 2023-09-22 DIAGNOSIS — M19011 Primary osteoarthritis, right shoulder: Secondary | ICD-10-CM | POA: Diagnosis not present

## 2023-09-22 DIAGNOSIS — N184 Chronic kidney disease, stage 4 (severe): Secondary | ICD-10-CM | POA: Diagnosis not present

## 2023-09-22 DIAGNOSIS — M129 Arthropathy, unspecified: Secondary | ICD-10-CM | POA: Diagnosis not present

## 2023-09-22 DIAGNOSIS — R7303 Prediabetes: Secondary | ICD-10-CM | POA: Diagnosis not present

## 2023-09-22 DIAGNOSIS — E785 Hyperlipidemia, unspecified: Secondary | ICD-10-CM | POA: Diagnosis not present

## 2023-09-22 DIAGNOSIS — M109 Gout, unspecified: Secondary | ICD-10-CM | POA: Diagnosis not present

## 2023-09-22 DIAGNOSIS — J302 Other seasonal allergic rhinitis: Secondary | ICD-10-CM | POA: Diagnosis not present

## 2023-09-22 DIAGNOSIS — M47816 Spondylosis without myelopathy or radiculopathy, lumbar region: Secondary | ICD-10-CM | POA: Diagnosis not present

## 2023-09-22 DIAGNOSIS — R5383 Other fatigue: Secondary | ICD-10-CM | POA: Diagnosis not present

## 2023-09-22 DIAGNOSIS — G8929 Other chronic pain: Secondary | ICD-10-CM | POA: Diagnosis not present

## 2023-09-22 DIAGNOSIS — Z7984 Long term (current) use of oral hypoglycemic drugs: Secondary | ICD-10-CM | POA: Diagnosis not present

## 2023-09-22 DIAGNOSIS — E559 Vitamin D deficiency, unspecified: Secondary | ICD-10-CM | POA: Diagnosis not present

## 2023-09-22 DIAGNOSIS — M19012 Primary osteoarthritis, left shoulder: Secondary | ICD-10-CM | POA: Diagnosis not present

## 2023-09-22 DIAGNOSIS — M7501 Adhesive capsulitis of right shoulder: Secondary | ICD-10-CM | POA: Diagnosis not present

## 2023-09-22 DIAGNOSIS — Z7952 Long term (current) use of systemic steroids: Secondary | ICD-10-CM | POA: Diagnosis not present

## 2023-09-27 ENCOUNTER — Other Ambulatory Visit: Payer: Self-pay | Admitting: Internal Medicine

## 2023-09-27 ENCOUNTER — Other Ambulatory Visit: Payer: Self-pay

## 2023-10-04 ENCOUNTER — Ambulatory Visit: Payer: Medicare HMO | Admitting: Pulmonary Disease

## 2023-10-04 ENCOUNTER — Encounter: Payer: Self-pay | Admitting: Pulmonary Disease

## 2023-10-04 VITALS — BP 151/82 | HR 63 | Temp 97.3°F | Ht 61.0 in | Wt 158.6 lb

## 2023-10-04 DIAGNOSIS — N184 Chronic kidney disease, stage 4 (severe): Secondary | ICD-10-CM | POA: Diagnosis not present

## 2023-10-04 DIAGNOSIS — J4540 Moderate persistent asthma, uncomplicated: Secondary | ICD-10-CM

## 2023-10-04 DIAGNOSIS — R0683 Snoring: Secondary | ICD-10-CM | POA: Diagnosis not present

## 2023-10-04 NOTE — Progress Notes (Unsigned)
Synopsis: Referred in December 2024 for dyspnea  Subjective:   PATIENT ID: [name] GENDER: female DOB: July 30, 1936, MRN: [id number]  HPI  Chief Complaint  Patient presents with   Follow-up   [name] is an 87 year old woman,  never smoker with asthma, GERD, breast cancer, frozen shoulders and hypertension who is referred to pulmonary clinic for shortness of breath.   She presents with worsening shortness of breath, wheezing, and coughing over the past three months. The shortness of breath is noticed at any time, more so when she is moving around or rushing. She does not notice it as much when she is sleeping. The patient's daughter reports that she hears wheezing and coughing when the patient is sleeping. The patient also reports having heartburn and reflux symptoms in the last two weeks. She has been on prednisone for pain management related to her frozen shoulders for a long time. The patient also mentions having allergies, which started when she was expecting her son. She reports having sinus congestion and drainage from time to time, and her eyes have been itching a lot.  Medical history significant for diastolic heart failure,  history of DVT, asthma, GERD, allergic rhinitis, chronic kidney disease, overactive bladder, history of breast cancer (lumpectomy only )  anemia, DJD , preDM     Social history patient is widowed.  She lives with her daughter.  She is retired.  She does have 1 adult children.  She is a never smoker, no alcohol or drug use.  Past Medical History:  Diagnosis Date   Allergic rhinitis 10/30/2016   Anemia    Asthma    Breast cancer of upper-outer quadrant of left female breast (HCC) 11/08/2013   ER/PR+ Her2- Left IDC    Chronic renal insufficiency    Chronic rhinitis    Colon polyp    Diastolic dysfunction 07/10/2016   DJD (degenerative joint disease)    Dyspnea    Frozen shoulder    Full dentures    GERD (gastroesophageal reflux  disease)    Hearing loss    Hypertension    Hyponatremia    Impaired glucose tolerance 07/18/2014   Memory loss    Morbid obesity (HCC)    Poor circulation    Vertigo    Wears glasses      Family History  Problem Relation Age of Onset   Colon cancer Mother        in her 82's   Stomach cancer Mother    Lung cancer Brother        was a smoker   Heart attack Son 22   Healthy Daughter      Social History   Socioeconomic History   Marital status: Widowed    Spouse name: Not on file   Number of children: 2   Years of education: Not on file   Highest education level: Not on file  Occupational History   Occupation: owns bakery and works PT for [employer]  Tobacco Use   Smoking status: Never    Passive exposure: Never   Smokeless tobacco: Never  Vaping Use   Vaping status: Never Used  Substance and Sexual Activity   Alcohol use: No    Alcohol/week: 0.0 standard drinks of alcohol   Drug use: No   Sexual activity: Not Currently  Other Topics Concern   Not on file  Social History Narrative   Not on file   Social Determinants of Health   Financial  Resource Strain: Low Risk  (06/23/2023)   Overall Financial Resource Strain (CARDIA)    Difficulty of Paying Living Expenses: Not hard at all  Food Insecurity: No Food Insecurity (06/23/2023)   Hunger Vital Sign    Worried About Running Out of Food in the Last Year: Never true    Ran Out of Food in the Last Year: Never true  Transportation Needs: No Transportation Needs (06/23/2023)   PRAPARE - Administrator, Civil Service (Medical): No    Lack of Transportation (Non-Medical): No  Physical Activity: Insufficiently Active (06/23/2023)   Exercise Vital Sign    Days of Exercise per Week: 4 days    Minutes of Exercise per Session: 20 min  Stress: No Stress Concern Present (06/23/2023)   [employer] of Occupational Health - Occupational Stress Questionnaire    Feeling of Stress : Not at all  Social  Connections: Moderately Integrated (06/23/2023)   Social Connection and Isolation Panel [NHANES]    Frequency of Communication with Friends and Family: More than three times a week    Frequency of Social Gatherings with Friends and Family: More than three times a week    Attends Religious Services: More than 4 times per year    Active Member of [employer] or Organizations: Yes    Attends Banker Meetings: More than 4 times per year    Marital Status: Widowed  Intimate Partner Violence: Not At Risk (06/23/2023)   Humiliation, Afraid, Rape, and Kick questionnaire    Fear of Current or Ex-Partner: No    Emotionally Abused: No    Physically Abused: No    Sexually Abused: No     Allergies  Allergen Reactions   Hydrocodone Itching   Lasix [Furosemide] Other (See Comments)    Dizziness.    Tizanidine Other (See Comments)    Dizzy and fall   Iron Hives and Other (See Comments)     bad constipation      Outpatient Medications Prior to Visit  Medication Sig Dispense Refill   albuterol (VENTOLIN HFA) 108 (90 Base) MCG/ACT inhaler Inhale 1 puff into the lungs every 6 (six) hours as needed for wheezing or shortness of breath. 18 g 5   allopurinol (ZYLOPRIM) 100 MG tablet Take 100 mg by mouth daily.     amLODipine (NORVASC) 5 MG tablet TAKE 1 TABLET BY MOUTH EVERY DAY (Patient taking differently: Take 5 mg by mouth every morning.) 90 tablet 3   aspirin EC 81 MG tablet Take 81 mg by mouth every morning. Swallow whole.     celecoxib (CELEBREX) 100 MG capsule Take 100 mg by mouth 2 (two) times daily.     Cholecalciferol (VITAMIN D3) 20 MCG (800 UNIT) TABS Take 1 tablet by mouth daily.     dapagliflozin propanediol (FARXIGA) 10 MG TABS tablet Take 1 tablet (10 mg total) by mouth daily before breakfast. 90 tablet 3   diclofenac Sodium (VOLTAREN) 1 % GEL Inhale 1 puff into the lungs 2 (two) times daily.     diclofenac Sodium (VOLTAREN) 1 % GEL APPLY 2 GRAMS TO AFFECTED AREA 4 TIMES A DAY 100  g 5   Ensure (ENSURE) Take 1 Can by mouth 3 (three) times daily between meals. (Patient taking differently: Take 237 mLs by mouth See admin instructions. Drink one can (237 mls) by mouth once or twice daily) 237 mL 12   febuxostat (ULORIC) 40 MG tablet Take 40 mg by mouth daily.     FLUAD  QUADRIVALENT 0.5 ML injection      fluticasone-salmeterol (WIXELA INHUB) 500-50 MCG/ACT AEPB Inhale 1 puff into the lungs in the morning and at bedtime. 3 each 3   furosemide (LASIX) 40 MG tablet Take 1 tablet (40 mg total) by mouth every Monday, Wednesday, and Friday.     gabapentin (NEURONTIN) 300 MG capsule TAKE 1 CAPSULE BY MOUTH THREE TIMES A DAY 90 capsule 5   Incontinence Supply Disposable (DEPEND UNDERWEAR SM/MED) MISC Use as directed four times per day 120 each 5   lidocaine (LIDODERM) 5 % Place 1 patch onto the skin daily. Remove & Discard patch within 12 hours or as directed by MD 30 patch 0   losartan (COZAAR) 100 MG tablet Take 100 mg by mouth daily.     magnesium gluconate (MAGONATE) 500 MG tablet Take 1 tablet (500 mg total) by mouth 2 (two) times daily. 180 tablet 1   meclizine (ANTIVERT) 12.5 MG tablet TAKE 1 TABLET BY MOUTH THREE TIMES A DAY AS NEEDED FOR DIZZINESS (Patient taking differently: Take 12.5 mg by mouth 3 (three) times daily as needed for dizziness.) 90 tablet 2   metoprolol succinate (TOPROL-XL) 25 MG 24 hr tablet Take 1 tablet (25 mg total) by mouth daily. 90 tablet 3   montelukast (SINGULAIR) 10 MG tablet TAKE 1 TABLET BY MOUTH EVERY DAY 90 tablet 2   Multiple Vitamin (MULTIVITAMIN WITH MINERALS) TABS tablet Take 1 tablet by mouth daily. Centrum Silver     pantoprazole (PROTONIX) 40 MG tablet TAKE 1 TABLET BY MOUTH EVERY DAY 90 tablet 3   potassium chloride (KLOR-CON) 10 MEQ tablet TAKE 1 TABLET BY MOUTH EVERY DAY WHEN TAKING FUROSEMIDE (Patient taking differently: Take 10 mEq by mouth every Monday, Wednesday, and Friday.) 90 tablet 3   predniSONE (DELTASONE) 10 MG tablet 3 tabs by  mouth per day for 3 days,2tabs per day for 3 days,1tab per day for 3 days 18 tablet 0   predniSONE (DELTASONE) 5 MG tablet Take 1 tablet (5 mg total) by mouth daily with breakfast. 90 tablet 3   SPIKEVAX syringe      triamcinolone cream (KENALOG) 0.1 % APPLY TO AFFECTED AREA TWICE A DAY 30 g 1   No facility-administered medications prior to visit.   Review of Systems  Constitutional:  Negative for chills, fever, malaise/fatigue and weight loss.  HENT:  Negative for congestion, sinus pain and sore throat.   Eyes: Negative.   Respiratory:  Positive for cough, shortness of breath and wheezing. Negative for hemoptysis and sputum production.   Cardiovascular:  Negative for chest pain, palpitations, orthopnea, claudication and leg swelling.  Gastrointestinal:  Negative for abdominal pain, heartburn, nausea and vomiting.  Genitourinary: Negative.   Musculoskeletal:  Negative for joint pain and myalgias.  Skin:  Negative for rash.  Neurological:  Negative for weakness.  Endo/Heme/Allergies: Negative.   Psychiatric/Behavioral: Negative.      Objective:   Vitals:   10/04/23 1436 10/04/23 1440  BP:  (!) 151/82  Pulse: 63 63  Temp:  (!) 97.3 F (36.3 C)  TempSrc:  Temporal  SpO2: 94% 94%  Weight:  158 lb 9.6 oz (71.9 kg)  Height:  5\' 1"  (1.549 m)     Physical Exam Constitutional:      General: She is not in acute distress.    Appearance: Normal appearance.  Eyes:     General: No scleral icterus.    Conjunctiva/sclera: Conjunctivae normal.  Cardiovascular:     Rate and Rhythm: Normal  rate and regular rhythm.  Pulmonary:     Breath sounds: No wheezing, rhonchi or rales.     Comments: Upper airway strain with breathing Musculoskeletal:     Right lower leg: No edema.     Left lower leg: No edema.  Skin:    General: Skin is warm and dry.  Neurological:     General: No focal deficit present.       CBC    Component Value Date/Time   WBC 7.5 08/24/2023 1423   RBC 3.60 (L)  08/24/2023 1423   HGB 12.0 08/24/2023 1423   HGB 10.0 (L) 10/25/2018 0946   HGB 10.2 (L) 07/27/2016 0934   HCT 37.9 08/24/2023 1423   HCT 31.3 (L) 07/27/2016 0934   PLT 205.0 08/24/2023 1423   PLT 254 10/25/2018 0946   PLT 200 07/27/2016 0934   MCV 105.3 (H) 08/24/2023 1423   MCV 96.0 07/27/2016 0934   MCH 31.6 08/06/2021 0517   MCHC 31.8 08/24/2023 1423   RDW 13.6 08/24/2023 1423   RDW 12.6 07/27/2016 0934   LYMPHSABS 1.8 08/24/2023 1423   LYMPHSABS 1.4 07/27/2016 0934   MONOABS 1.0 08/24/2023 1423   MONOABS 0.4 07/27/2016 0934   EOSABS 0.3 08/24/2023 1423   EOSABS 0.2 07/27/2016 0934   BASOSABS 0.0 08/24/2023 1423   BASOSABS 0.1 07/27/2016 0934      Latest Ref Rng & Units 09/02/2023    2:55 PM 08/24/2023    2:23 PM 06/25/2023    2:48 PM  BMP  Glucose 70 - 99 mg/dL 440  86  102   BUN 6 - 23 mg/dL 31  67  40   Creatinine 0.40 - 1.20 mg/dL 7.25  3.66  4.40   Sodium 135 - 145 mEq/L 141  140  142   Potassium 3.5 - 5.1 mEq/L 4.1  4.2  4.6   Chloride 96 - 112 mEq/L 102  99  99   CO2 19 - 32 mEq/L 33  34  34   Calcium 8.4 - 10.5 mg/dL 9.8  9.9  9.9    Chest imaging: CXR 08/02/23 Mild bronchial thickening. Improving linear left lung base opacity, likely atelectasis.  PFT:     No data to display          Labs:  Path:  Echo 07/28/21:  1. Left ventricular ejection fraction, by estimation, is 70 to 75%. The  left ventricle has hyperdynamic function. The left ventricle has no  regional wall motion abnormalities. There is mild left ventricular  hypertrophy. Left ventricular diastolic  parameters are consistent with Grade I diastolic dysfunction (impaired  relaxation).   2. Right ventricular systolic function is normal. The right ventricular  size is normal.   3. The mitral valve is normal in structure. No evidence of mitral valve  regurgitation. No evidence of mitral stenosis.   4. The aortic valve is tricuspid. Aortic valve regurgitation is not  visualized. Mild  aortic valve sclerosis is present, with no evidence of  aortic valve stenosis.   5. The inferior vena cava is normal in size with greater than 50%  respiratory variability, suggesting right atrial pressure of 3 mmHg.       Assessment & Plan:   Moderate persistent asthma without complication  Snoring - Plan: Home sleep test  Discussion: Briniya Langel is an 87 year old woman,  never smoker with asthma, GERD, breast cancer, frozen shoulders and hypertension who is referred to pulmonary clinic for shortness of breath.  Asthma  Increased shortness of breath over the last three months, even at rest. Wheezing noted by family members. No recent viral infections. Currently on Wixela inhaler (1 puff twice daily), Albuterol inhaler as needed, Montelukast daily, and Prednisone 5mg  daily. Labs show elevated bicarb levels suggesting possible CO2 retention.  -Consider switching from Wixela inhaler to nebulizer treatment regimen for better medication delivery. Start with short-acting nebulizer treatment before Wixela inhaler, and consider long-acting nebulizer treatment if needed.  Gastroesophageal Reflux Disease (GERD) Reports heartburn and reflux symptoms in the last two weeks. Currently on Protonix (pantoprazole) daily. -Continue Protonix daily.  Snoring -Order home sleep study to evaluate for possible sleep apnea.  Melody Comas, MD [facility] Office: [phone]    Current Outpatient Medications:    albuterol (VENTOLIN HFA) 108 (90 Base) MCG/ACT inhaler, Inhale 1 puff into the lungs every 6 (six) hours as needed for wheezing or shortness of breath., Disp: 18 g, Rfl: 5   allopurinol (ZYLOPRIM) 100 MG tablet, Take 100 mg by mouth daily., Disp: , Rfl:    amLODipine (NORVASC) 5 MG tablet, TAKE 1 TABLET BY MOUTH EVERY DAY (Patient taking differently: Take 5 mg by mouth every morning.), Disp: 90 tablet, Rfl: 3   aspirin EC 81 MG tablet, Take 81 mg by mouth every  morning. Swallow whole., Disp: , Rfl:    celecoxib (CELEBREX) 100 MG capsule, Take 100 mg by mouth 2 (two) times daily., Disp: , Rfl:    Cholecalciferol (VITAMIN D3) 20 MCG (800 UNIT) TABS, Take 1 tablet by mouth daily., Disp: , Rfl:    dapagliflozin propanediol (FARXIGA) 10 MG TABS tablet, Take 1 tablet (10 mg total) by mouth daily before breakfast., Disp: 90 tablet, Rfl: 3   diclofenac Sodium (VOLTAREN) 1 % GEL, Inhale 1 puff into the lungs 2 (two) times daily., Disp: , Rfl:    diclofenac Sodium (VOLTAREN) 1 % GEL, APPLY 2 GRAMS TO AFFECTED AREA 4 TIMES A DAY, Disp: 100 g, Rfl: 5   Ensure (ENSURE), Take 1 Can by mouth 3 (three) times daily between meals. (Patient taking differently: Take 237 mLs by mouth See admin instructions. Drink one can (237 mls) by mouth once or twice daily), Disp: 237 mL, Rfl: 12   febuxostat (ULORIC) 40 MG tablet, Take 40 mg by mouth daily., Disp: , Rfl:    FLUAD QUADRIVALENT 0.5 ML injection, , Disp: , Rfl:    fluticasone-salmeterol (WIXELA INHUB) 500-50 MCG/ACT AEPB, Inhale 1 puff into the lungs in the morning and at bedtime., Disp: 3 each, Rfl: 3   furosemide (LASIX) 40 MG tablet, Take 1 tablet (40 mg total) by mouth every Monday, Wednesday, and Friday., Disp: , Rfl:    gabapentin (NEURONTIN) 300 MG capsule, TAKE 1 CAPSULE BY MOUTH THREE TIMES A DAY, Disp: 90 capsule, Rfl: 5   Incontinence Supply Disposable (DEPEND UNDERWEAR SM/MED) MISC, Use as directed four times per day, Disp: 120 each, Rfl: 5   lidocaine (LIDODERM) 5 %, Place 1 patch onto the skin daily. Remove & Discard patch within 12 hours or as directed by MD, Disp: 30 patch, Rfl: 0   losartan (COZAAR) 100 MG tablet, Take 100 mg by mouth daily., Disp: , Rfl:    magnesium gluconate (MAGONATE) 500 MG tablet, Take 1 tablet (500 mg total) by mouth 2 (two) times daily., Disp: 180 tablet, Rfl: 1   meclizine (ANTIVERT) 12.5 MG tablet, TAKE 1 TABLET BY MOUTH THREE TIMES A DAY AS NEEDED FOR DIZZINESS (Patient taking  differently: Take 12.5  mg by mouth 3 (three) times daily as needed for dizziness.), Disp: 90 tablet, Rfl: 2   metoprolol succinate (TOPROL-XL) 25 MG 24 hr tablet, Take 1 tablet (25 mg total) by mouth daily., Disp: 90 tablet, Rfl: 3   montelukast (SINGULAIR) 10 MG tablet, TAKE 1 TABLET BY MOUTH EVERY DAY, Disp: 90 tablet, Rfl: 2   Multiple Vitamin (MULTIVITAMIN WITH MINERALS) TABS tablet, Take 1 tablet by mouth daily. Centrum Silver, Disp: , Rfl:    pantoprazole (PROTONIX) 40 MG tablet, TAKE 1 TABLET BY MOUTH EVERY DAY, Disp: 90 tablet, Rfl: 3   potassium chloride (KLOR-CON) 10 MEQ tablet, TAKE 1 TABLET BY MOUTH EVERY DAY WHEN TAKING FUROSEMIDE (Patient taking differently: Take 10 mEq by mouth every Monday, Wednesday, and Friday.), Disp: 90 tablet, Rfl: 3   predniSONE (DELTASONE) 10 MG tablet, 3 tabs by mouth per day for 3 days,2tabs per day for 3 days,1tab per day for 3 days, Disp: 18 tablet, Rfl: 0   predniSONE (DELTASONE) 5 MG tablet, Take 1 tablet (5 mg total) by mouth daily with breakfast., Disp: 90 tablet, Rfl: 3   SPIKEVAX syringe, , Disp: , Rfl:    triamcinolone cream (KENALOG) 0.1 %, APPLY TO AFFECTED AREA TWICE A DAY, Disp: 30 g, Rfl: 1

## 2023-10-04 NOTE — Patient Instructions (Addendum)
Use albuterol inhaler 2 puffs each morning and evening with spacer  Then use Wixella inhaler 1 puff each morning and evening  If the inhalers remain difficult to use, I recommend we transition to an all nebulizer regimen in the future  We will order you a home sleep study  Follow up in 4 months

## 2023-10-05 DIAGNOSIS — M25512 Pain in left shoulder: Secondary | ICD-10-CM | POA: Diagnosis not present

## 2023-10-05 DIAGNOSIS — M25511 Pain in right shoulder: Secondary | ICD-10-CM | POA: Diagnosis not present

## 2023-10-06 ENCOUNTER — Encounter: Payer: Self-pay | Admitting: Pulmonary Disease

## 2023-10-14 DIAGNOSIS — G4730 Sleep apnea, unspecified: Secondary | ICD-10-CM | POA: Diagnosis not present

## 2023-10-14 DIAGNOSIS — R0683 Snoring: Secondary | ICD-10-CM

## 2023-10-18 DIAGNOSIS — R7303 Prediabetes: Secondary | ICD-10-CM | POA: Diagnosis not present

## 2023-10-18 DIAGNOSIS — K219 Gastro-esophageal reflux disease without esophagitis: Secondary | ICD-10-CM | POA: Diagnosis not present

## 2023-10-18 DIAGNOSIS — D631 Anemia in chronic kidney disease: Secondary | ICD-10-CM | POA: Diagnosis not present

## 2023-10-18 DIAGNOSIS — I129 Hypertensive chronic kidney disease with stage 1 through stage 4 chronic kidney disease, or unspecified chronic kidney disease: Secondary | ICD-10-CM | POA: Diagnosis not present

## 2023-10-18 DIAGNOSIS — N281 Cyst of kidney, acquired: Secondary | ICD-10-CM | POA: Diagnosis not present

## 2023-10-18 DIAGNOSIS — N189 Chronic kidney disease, unspecified: Secondary | ICD-10-CM | POA: Diagnosis not present

## 2023-10-18 DIAGNOSIS — E871 Hypo-osmolality and hyponatremia: Secondary | ICD-10-CM | POA: Diagnosis not present

## 2023-10-18 DIAGNOSIS — N184 Chronic kidney disease, stage 4 (severe): Secondary | ICD-10-CM | POA: Diagnosis not present

## 2023-10-18 DIAGNOSIS — R600 Localized edema: Secondary | ICD-10-CM | POA: Diagnosis not present

## 2023-10-18 DIAGNOSIS — R3129 Other microscopic hematuria: Secondary | ICD-10-CM | POA: Diagnosis not present

## 2023-10-18 DIAGNOSIS — N2581 Secondary hyperparathyroidism of renal origin: Secondary | ICD-10-CM | POA: Diagnosis not present

## 2023-10-18 DIAGNOSIS — R809 Proteinuria, unspecified: Secondary | ICD-10-CM | POA: Diagnosis not present

## 2023-10-26 DIAGNOSIS — H43813 Vitreous degeneration, bilateral: Secondary | ICD-10-CM | POA: Diagnosis not present

## 2023-10-26 DIAGNOSIS — H11823 Conjunctivochalasis, bilateral: Secondary | ICD-10-CM | POA: Diagnosis not present

## 2023-10-26 DIAGNOSIS — H10413 Chronic giant papillary conjunctivitis, bilateral: Secondary | ICD-10-CM | POA: Diagnosis not present

## 2023-10-26 DIAGNOSIS — H04123 Dry eye syndrome of bilateral lacrimal glands: Secondary | ICD-10-CM | POA: Diagnosis not present

## 2023-10-26 DIAGNOSIS — Z961 Presence of intraocular lens: Secondary | ICD-10-CM | POA: Diagnosis not present

## 2023-10-31 ENCOUNTER — Telehealth: Payer: Self-pay | Admitting: Pulmonary Disease

## 2023-10-31 NOTE — Telephone Encounter (Signed)
 Call patient  Sleep study result  Date of study: 10/14/2023  Impression: Severe obstructive sleep apnea with mild oxygen  desaturations  Recommendation: DME referral  Recommend CPAP therapy for severe obstructive sleep apnea  Auto titrating CPAP with pressure settings of 5-20 will be appropriate  Encourage weight loss measures  Follow-up in the office 4 to 6 weeks following initiation of treatment

## 2023-11-01 DIAGNOSIS — N184 Chronic kidney disease, stage 4 (severe): Secondary | ICD-10-CM | POA: Diagnosis not present

## 2023-11-01 NOTE — Telephone Encounter (Signed)
 Atc pt no answer, lvmm for pt to give the office a call back for sleep study results

## 2023-11-02 NOTE — Telephone Encounter (Signed)
 Patient is returning phone call. Patient phone number is [phone].

## 2023-11-05 ENCOUNTER — Telehealth: Payer: Self-pay

## 2023-11-05 ENCOUNTER — Other Ambulatory Visit: Payer: Self-pay | Admitting: Physical Medicine and Rehabilitation

## 2023-11-05 DIAGNOSIS — M25511 Pain in right shoulder: Secondary | ICD-10-CM | POA: Diagnosis not present

## 2023-11-05 DIAGNOSIS — M25512 Pain in left shoulder: Secondary | ICD-10-CM | POA: Diagnosis not present

## 2023-11-05 MED ORDER — MAGNESIUM GLYCINATE 120 MG PO CAPS: 2.0000 | ORAL_CAPSULE | Freq: Every evening | ORAL | 3 refills | Status: DC

## 2023-11-05 NOTE — Telephone Encounter (Signed)
 Patient requesting refill on Magnesium tablet

## 2023-11-05 NOTE — Telephone Encounter (Signed)
 Called and spoke with pt, no answer, lvmm for pt to  give the office a call back

## 2023-11-08 NOTE — Telephone Encounter (Signed)
 Called and spoke with patient.  Dr. Trena Platt recommendations given.  Patient requested to let her daughter know and have her daughter call office back.  Will leave message open until daughter calls office back.

## 2023-11-09 NOTE — Telephone Encounter (Signed)
 Patient's daughter Starleen Blue is returning phone call. [name] phone number is [phone].

## 2023-11-11 ENCOUNTER — Other Ambulatory Visit: Payer: Self-pay | Admitting: Internal Medicine

## 2023-11-12 NOTE — Telephone Encounter (Signed)
Atc pts daughter no answer, closing encounter due to pt already receiving results.

## 2023-11-22 ENCOUNTER — Telehealth: Payer: Self-pay

## 2023-11-22 NOTE — Telephone Encounter (Signed)
Patient called for a refill of Magnesium Glycinate. Please send to [facility] on [address]

## 2023-11-24 NOTE — Telephone Encounter (Signed)
Patient aware of Dr. Carlis Abbott reply.

## 2023-11-26 ENCOUNTER — Ambulatory Visit: Payer: Medicare HMO | Admitting: Podiatry

## 2023-11-26 ENCOUNTER — Encounter: Payer: Self-pay | Admitting: Podiatry

## 2023-11-26 DIAGNOSIS — B351 Tinea unguium: Secondary | ICD-10-CM | POA: Diagnosis not present

## 2023-11-26 DIAGNOSIS — M79675 Pain in left toe(s): Secondary | ICD-10-CM

## 2023-11-26 DIAGNOSIS — M79674 Pain in right toe(s): Secondary | ICD-10-CM | POA: Diagnosis not present

## 2023-11-26 NOTE — Progress Notes (Signed)
  Subjective:  Patient ID: [name], female    DOB: 01/16/1936,  MRN: [id number]  Chief Complaint  Patient presents with   Debridement    Trim toenails - diabetic - 6.4   88 y.o. female returns for the above complaint.  Patient presents with thickened elongated dystrophic mycotic toenails x 10 mild pain on palpation patient would like for me to debride down she is not able to do it herself.  Objective:  There were no vitals filed for this visit. Podiatric Exam: Vascular: dorsalis pedis and posterior tibial pulses are palpable bilateral. Capillary return is immediate. Temperature gradient is WNL. Skin turgor WNL  Sensorium: Normal Semmes Weinstein monofilament test. Normal tactile sensation bilaterally. Nail Exam: Pt has thick disfigured discolored nails with subungual debris noted bilateral entire nail hallux through fifth toenails.  Pain on palpation to the nails. Ulcer Exam: There is no evidence of ulcer or pre-ulcerative changes or infection. Orthopedic Exam: Muscle tone and strength are WNL. No limitations in general ROM. No crepitus or effusions noted.  Skin: No Porokeratosis. No infection or ulcers    Assessment & Plan:   No diagnosis found.    Patient was evaluated and treated and all questions answered.  Onychomycosis with pain  -Nails palliatively debrided as below. -Educated on self-care  Procedure: Nail Debridement Rationale: pain  Type of Debridement: manual, sharp debridement. Instrumentation: Nail nipper, rotary burr. Number of Nails: 10  Procedures and Treatment: Consent by patient was obtained for treatment procedures. The patient understood the discussion of treatment and procedures well. All questions were answered thoroughly reviewed. Debridement of mycotic and hypertrophic toenails, 1 through 5 bilateral and clearing of subungual debris. No ulceration, no infection noted.  Return Visit-Office Procedure: Patient instructed to return to the office  for a follow up visit 3 months for continued evaluation and treatment.  Nicholes Rough, DPM    Return in about 3 months (around 02/23/2024) for [place], Routine Foot Care.

## 2023-12-06 DIAGNOSIS — M25512 Pain in left shoulder: Secondary | ICD-10-CM | POA: Diagnosis not present

## 2023-12-06 DIAGNOSIS — M25511 Pain in right shoulder: Secondary | ICD-10-CM | POA: Diagnosis not present

## 2023-12-07 ENCOUNTER — Encounter: Payer: Medicare HMO | Admitting: Physical Medicine and Rehabilitation

## 2023-12-22 ENCOUNTER — Telehealth: Payer: Self-pay | Admitting: Internal Medicine

## 2023-12-22 DIAGNOSIS — M19011 Primary osteoarthritis, right shoulder: Secondary | ICD-10-CM | POA: Diagnosis not present

## 2023-12-22 DIAGNOSIS — Z01419 Encounter for gynecological examination (general) (routine) without abnormal findings: Secondary | ICD-10-CM | POA: Diagnosis not present

## 2023-12-22 DIAGNOSIS — Z6829 Body mass index (BMI) 29.0-29.9, adult: Secondary | ICD-10-CM | POA: Diagnosis not present

## 2023-12-22 DIAGNOSIS — N184 Chronic kidney disease, stage 4 (severe): Secondary | ICD-10-CM | POA: Diagnosis not present

## 2023-12-22 DIAGNOSIS — M19012 Primary osteoarthritis, left shoulder: Secondary | ICD-10-CM | POA: Diagnosis not present

## 2023-12-22 DIAGNOSIS — R001 Bradycardia, unspecified: Secondary | ICD-10-CM | POA: Diagnosis not present

## 2023-12-22 NOTE — Telephone Encounter (Signed)
 Copied from CRM [id number]. Topic: Clinical - Prescription Issue >> Dec 22, 2023 10:30 AM Kathryne Eriksson wrote: Reason for CRM: losartan (COZAAR) 100 MG tablet >> Dec 22, 2023 10:32 AM Kathryne Eriksson wrote: Patient's daughter called in on behalf of her mother, states they were seen at [facility] per her referral and was advised to reach out to Corwin Levins, MD in regards to lowering the medication dosage. They were told, patient's blood pressure is dangerously low, therefor the dosage needs to be lowered.   Patient's daughter said she will have her mother stop taking the medication for the remainder of the week until she's given a lower dosage and hears back from the office.

## 2023-12-22 NOTE — Telephone Encounter (Signed)
 Ok to hold losartan  Please to check BP at home twice per day if possible  Needs ROV - last seen oct 2024

## 2023-12-24 ENCOUNTER — Encounter: Payer: Medicare HMO | Attending: Physical Medicine and Rehabilitation | Admitting: Physical Medicine and Rehabilitation

## 2023-12-24 ENCOUNTER — Encounter: Payer: Self-pay | Admitting: Physical Medicine and Rehabilitation

## 2023-12-24 VITALS — BP 110/70 | HR 58 | Ht 61.0 in | Wt 151.0 lb

## 2023-12-24 DIAGNOSIS — I10 Essential (primary) hypertension: Secondary | ICD-10-CM

## 2023-12-24 DIAGNOSIS — M25511 Pain in right shoulder: Secondary | ICD-10-CM | POA: Insufficient documentation

## 2023-12-24 DIAGNOSIS — M353 Polymyalgia rheumatica: Secondary | ICD-10-CM | POA: Diagnosis not present

## 2023-12-24 DIAGNOSIS — M25512 Pain in left shoulder: Secondary | ICD-10-CM | POA: Diagnosis not present

## 2023-12-24 MED ORDER — PREDNISONE 5 MG PO TABS: 5.0000 mg | ORAL_TABLET | Freq: Every day | ORAL | 3 refills | Status: DC

## 2023-12-24 NOTE — Telephone Encounter (Signed)
 No agreement is needed, as this is simply the [facility] doctors trying to get more primary care patients. This is not ethical to my mind as they dont even ask me.    You can tell then you already have a primary care doctor and do not need to change this part of your treatment.   thanks

## 2023-12-24 NOTE — Patient Instructions (Addendum)
 Blue emu oil, olive oil

## 2023-12-24 NOTE — Telephone Encounter (Signed)
 Called and spoke with Pt daughter and she states PT was just seen by [facility] 2 days ago for a visit which they were referred there by Dr.Corey and the daughter stated they are kind of like her primary as well, she would like for all the doctors to come to an agreement as she states its a lot of appts.

## 2023-12-24 NOTE — Progress Notes (Addendum)
 Subjective:    Patient ID: [name], female    DOB: 09/05/1936, 88 y.o.   MRN: [id number]  1) Bilateral shoulder pain: -[name] is an 88 year old woman who presents for follow-up of pain in her bilateral shoulders.   -she is interested in trying tens unit  -she has been having benefit with prednisone 5mg  daily as needed  -shoulder pain is worst in the morning  -she takes the steroids every day  -she did PT and this helped   2) Bilateral hip pain -also has pain in hips  -had no benefit from right hip steroid injection -oral steroids help. That is the thing that really helps her. She has been taking 2mg  daily. She is worried about taking too much steroids -hasn't used lidocaine patches  She had no benefit from the corticosteroid shots. She felt sick afterward. Her daughter has to bring her to her appointments so she was not able to come in earlier. She is taking oral prednisone- she started yesterday. Has not yet been able to feel any benefit. She currently feels pain in both shoulders and thighs.   She prefers less medications. She is willing to try gel and patches and anti-inflammatory foods.   She is having difficulty raising her hands and thus performing ADLs due to her pain. She does have excellent family support for which she is very thankful.    Her daughter calls in today.   Her doctor stopped the prednisone because of her kidneys. The prednisone really helped. Her daughter would prefer using the steroids as needed rather than trying something else   Pain Inventory Average Pain 7 Pain Right Now 5 My pain is intermittent and dull  In the last 24 hours, has pain interfered with the following? General activity  a little bit Relation with others 0 Enjoyment of life 0 What TIME of day is your pain at its worst? morning  Sleep (in general) Good  Pain is worse with: walking, bending, inactivity, standing, and some activites Pain improves with: rest,  heat/ice, and medication Relief from Meds:  I really don't see any relief.   Family History  Problem Relation Age of Onset   Colon cancer Mother        in her 71's   Stomach cancer Mother    Lung cancer Brother        was a smoker   Heart attack Son 40   Healthy Daughter    Social History   Socioeconomic History   Marital status: Widowed    Spouse name: Not on file   Number of children: 2   Years of education: Not on file   Highest education level: Not on file  Occupational History   Occupation: owns bakery and works PT for [employer]  Tobacco Use   Smoking status: Never    Passive exposure: Never   Smokeless tobacco: Never  Vaping Use   Vaping status: Never Used  Substance and Sexual Activity   Alcohol use: No    Alcohol/week: 0.0 standard drinks of alcohol   Drug use: No   Sexual activity: Not Currently  Other Topics Concern   Not on file  Social History Narrative   Not on file   Social Drivers of Health   Financial Resource Strain: Low Risk  (06/23/2023)   Overall Financial Resource Strain (CARDIA)    Difficulty of Paying Living Expenses: Not hard at all  Food Insecurity: No Food Insecurity (06/23/2023)  Hunger Vital Sign    Worried About Running Out of Food in the Last Year: Never true    Ran Out of Food in the Last Year: Never true  Transportation Needs: No Transportation Needs (06/23/2023)   PRAPARE - Administrator, Civil Service (Medical): No    Lack of Transportation (Non-Medical): No  Physical Activity: Insufficiently Active (06/23/2023)   Exercise Vital Sign    Days of Exercise per Week: 4 days    Minutes of Exercise per Session: 20 min  Stress: No Stress Concern Present (06/23/2023)   [employer] of Occupational Health - Occupational Stress Questionnaire    Feeling of Stress : Not at all  Social Connections: Moderately Integrated (06/23/2023)   Social Connection and Isolation Panel [NHANES]    Frequency of Communication with  Friends and Family: More than three times a week    Frequency of Social Gatherings with Friends and Family: More than three times a week    Attends Religious Services: More than 4 times per year    Active Member of [employer] or Organizations: Yes    Attends Banker Meetings: More than 4 times per year    Marital Status: Widowed   Past Surgical History:  Procedure Laterality Date   ABDOMINAL HYSTERECTOMY     BREAST LUMPECTOMY WITH NEEDLE LOCALIZATION AND AXILLARY SENTINEL LYMPH NODE BX Left 12/04/2013   Procedure: BREAST LUMPECTOMY WITH NEEDLE LOCALIZATION AND AXILLARY SENTINEL LYMPH NODE BX;  Surgeon: Kandis Cocking, MD;  Location: [facility];  Service: General;  Laterality: Left;   CATARACT EXTRACTION  2009   rt   COLONOSCOPY     EYE SURGERY Bilateral    cataract surgery   KNEE ARTHROSCOPY     both   LUMBAR LAMINECTOMY/DECOMPRESSION MICRODISCECTOMY Left 12/23/2017   Procedure: Left Lumbar One-Two Laminectomy with microdiscectomy;  Surgeon: Tia Alert, MD;  Location: [facility];  Service: Neurosurgery;  Laterality: Left;  Left L1-2 Laminectomy with microdiscectomy   TONSILLECTOMY     TOTAL KNEE ARTHROPLASTY  2002   rt   TOTAL KNEE ARTHROPLASTY  2003   left   VESICOVAGINAL FISTULA CLOSURE W/ TAH  1980   Past Surgical History:  Procedure Laterality Date   ABDOMINAL HYSTERECTOMY     BREAST LUMPECTOMY WITH NEEDLE LOCALIZATION AND AXILLARY SENTINEL LYMPH NODE BX Left 12/04/2013   Procedure: BREAST LUMPECTOMY WITH NEEDLE LOCALIZATION AND AXILLARY SENTINEL LYMPH NODE BX;  Surgeon: Kandis Cocking, MD;  Location: [facility];  Service: General;  Laterality: Left;   CATARACT EXTRACTION  2009   rt   COLONOSCOPY     EYE SURGERY Bilateral    cataract surgery   KNEE ARTHROSCOPY     both   LUMBAR LAMINECTOMY/DECOMPRESSION MICRODISCECTOMY Left 12/23/2017   Procedure: Left Lumbar One-Two Laminectomy with microdiscectomy;  Surgeon: Tia Alert, MD;  Location:  [facility];  Service: Neurosurgery;  Laterality: Left;  Left L1-2 Laminectomy with microdiscectomy   TONSILLECTOMY     TOTAL KNEE ARTHROPLASTY  2002   rt   TOTAL KNEE ARTHROPLASTY  2003   left   VESICOVAGINAL FISTULA CLOSURE W/ TAH  1980   Past Medical History:  Diagnosis Date   Allergic rhinitis 10/30/2016   Anemia    Asthma    Breast cancer of upper-outer quadrant of left female breast (HCC) 11/08/2013   ER/PR+ Her2- Left IDC    Chronic renal insufficiency    Chronic rhinitis    Colon polyp  Diastolic dysfunction 07/10/2016   DJD (degenerative joint disease)    Dyspnea    Frozen shoulder    Full dentures    GERD (gastroesophageal reflux disease)    Hearing loss    Hypertension    Hyponatremia    Impaired glucose tolerance 07/18/2014   Memory loss    Morbid obesity (HCC)    Poor circulation    Vertigo    Wears glasses    Ht 5\' 1"  (1.549 m)   Wt 151 lb (68.5 kg)   BMI 28.53 kg/m   Opioid Risk Score:   Fall Risk Score:  `1  Depression screen PHQ 2/9     08/24/2023    1:19 PM 08/02/2023    2:05 PM 06/25/2023    1:58 PM 06/23/2023    1:41 PM 03/17/2023    3:56 PM 12/15/2022    2:53 PM 12/07/2022   10:05 AM  Depression screen PHQ 2/9  Decreased Interest 0 0 0 0 0 0 0  Down, Depressed, Hopeless 0 0 0 0 0 0 0  PHQ - 2 Score 0 0 0 0 0 0 0  Altered sleeping 0 0 0 0  0   Tired, decreased energy 0 0 0 0  0   Change in appetite 0 0 0 0  0   Feeling bad or failure about yourself  0 0 0 0  0   Trouble concentrating 0 0 0 0  0   Moving slowly or fidgety/restless 0 0 0 0  0   Suicidal thoughts 0 0 0 0  0   PHQ-9 Score 0 0 0 0  0   Difficult doing work/chores Not difficult at all Not difficult at all Not difficult at all Not difficult at all  Not difficult at all    Review of Systems  Constitutional: Negative.   HENT: Negative.    Eyes: Negative.   Respiratory: Negative.    Cardiovascular: Negative.   Gastrointestinal: Negative.   Endocrine: Negative.    Genitourinary: Negative.   Musculoskeletal:  Positive for arthralgias, back pain and gait problem.  Skin: Negative.   Allergic/Immunologic: Negative.   Hematological: Negative.   Psychiatric/Behavioral: Negative.         Objective:   Physical Exam Gen: no distress, normal appearing, BMI 29.85, 152/83 HEENT: oral mucosa pink and moist, NCAT Cardio: Reg rate Chest: normal effort, normal rate of breathing Abd: soft, non-distended Ext: no edema Skin: intact Neuro:Alert and oriented x3 Musculoskeletal: Severely limited range of motion in bilateral arms- only about 30 degrees in bilateral elevation.  Psych: pleasant, normal affect    Assessment & Plan:   1) Chronic bilateral adhesive capsulitis: Tylenol 650mg  morning, lunch, dinner  -continue to use turmeric, recommended adding black pepper  -discussed aquthatherapy  -stop tens unit as needed daughter's assistance  -continue heat/vibration shield   -stop voltaren as not helping  -recommended olive oil after her shoulder  Lidocaine patch on both shoulders, reordered  -continue prednisone 5mg  daily   -home therapy ordered  Vitamin D level checked and was normal in 2020, recommended daily D3 while on steroids  -recommended red light therapy  2) Hypomagnesemia  -Magnesium level checked from 2019 and was low at 1.3: will order supplement to take at night  3) Family support: Has wonderful family support. Discussed how touched she is by her children's care.   4) Incontinent stool/constipation: Magnesium will also help to regulate this. Discussed goal of 1-2 BM per day.   5)  Polymyalgia rheumatica -continue prednisone -continue CBD oil -Discussed current symptoms of pain and history of pain.  -Discussed benefits of exercise in reducing pain. -Discussed following foods that may reduce pain: 1) Ginger (especially studied for arthritis)- reduce leukotriene production to decrease inflammation 2) Blueberries- high in  phytonutrients that decrease inflammation 3) Salmon- marine omega-3s reduce joint swelling and pain 4) Pumpkin seeds- reduce inflammation 5) dark chocolate- reduces inflammation 6) turmeric- reduces inflammation 7) tart cherries - reduce pain and stiffness 8) extra virgin olive oil - its compound olecanthal helps to block prostaglandins  9) chili peppers- can be eaten or applied topically via capsaicin 10) mint- helpful for headache, muscle aches, joint pain, and itching 11) garlic- reduces inflammation  Link to further information on diet for chronic pain: http://www.bray.com/   Turmeric to reduce inflammation--can be used in cooking or taken as a supplement.  Benefits of turmeric:  -Highly anti-inflammatory  -Increases antioxidants  -Improves memory, attention, brain disease  -Lowers risk of heart disease  -May help prevent cancer  -Decreases pain  -Alleviates depression  -Delays aging and decreases risk of chronic disease  -Consume with black pepper to increase absorption    Turmeric Milk Recipe:  1 cup milk  1 tsp turmeric  1 tsp cinnamon  1 tsp grated ginger (optional)  Black pepper (boosts the anti-inflammatory properties of turmeric).  1 tsp honey   6) CKD stage 4: -recommending staying well hydrated.   7) Osteoporosis? -discussed benefits of weight bearing exercise -recommend yogurt and figs.   8) Tongue drying out/?sleep apnea/snoring -discussed decreasing inflammatory carbohydrates in the diet -avoid mouth wash  9) Bilateral hip pain: -Prescribing Home Zynex NexWave Stimulator Device and supplies as needed. IFC, NMES and TENS medically necessary Treatment Rx: Daily @ 30-40 minutes per treatment PRN. Zynex NexWave only, no substitutions. Treatment Goals: 1) To reduce and/or eliminate pain 2) To improve functional capacity and Activities of daily living 3) To reduce or  prevent the need for oral medications 4) To improve circulation in the injured region 5) To decrease or prevent muscle spasm and muscle atrophy 6) To provide a self-management tool to the patient The patient has not sufficiently improved with conservative care. Numerous studies indexed by Medline and PubMed.gov have shown Neuromuscular, Interferential, and TENS stimulators to reduce pain, improve function, and reduce medication use in injured patients. Continued use of this evidence based, safe, drug free treatment is both reasonable and medically necessary at this time.   -discussed SI belt  10) Overweight:  -discussed that weight is improved  11) HTN: -BP reviewed and is perfect today! -Advised checking BP daily at home and logging results to bring into follow-up appointment with PCP and myself. -Reviewed BP meds today.  -Advised regarding healthy foods that can help lower blood pressure and provided with a list: 1) citrus foods- high in vitamins and minerals 2) salmon and other fatty fish - reduces inflammation and oxylipins 3) swiss chard (leafy green)- high level of nitrates 4) pumpkin seeds- one of the best natural sources of magnesium 5) Beans and lentils- high in fiber, magnesium, and potassium 6) Berries- high in flavonoids 7) Amaranth (whole grain, can be cooked similarly to rice and oats)- high in magnesium and fiber 8) Pistachios- even more effective at reducing BP than other nuts 9) Carrots- high in phenolic compounds that relax blood vessels and reduce inflammation 10) Celery- contain phthalides that relax tissues of arterial walls 11) Tomatoes- can also improve cholesterol and reduce risk of heart disease  12) Broccoli- good source of magnesium, calcium, and potassium 13) Greek yogurt: high in potassium and calcium 14) Herbs and spices: Celery seed, cilantro, saffron, lemongrass, black cumin, ginseng, cinnamon, cardamom, sweet basil, and ginger 15) Chia and flax seeds- also  help to lower cholesterol and blood sugar 16) Beets- high levels of nitrates that relax blood vessels  17) spinach and bananas- high in potassium  -Provided lise of supplements that can help with hypertension:  1) magnesium: one high quality brand is Bioptemizers since it contains all 7 types of magnesium, otherwise over the counter magnesium gluconate 400mg  is a good option 2) B vitamins 3) vitamin D 4) potassium 5) CoQ10 6) L-arginine 7) Vitamin C 8) Beetroot -Educated that goal BP is 120/80. -Made goal to incorporate some of the above foods into diet.

## 2023-12-29 DIAGNOSIS — K219 Gastro-esophageal reflux disease without esophagitis: Secondary | ICD-10-CM | POA: Diagnosis not present

## 2023-12-29 DIAGNOSIS — J45909 Unspecified asthma, uncomplicated: Secondary | ICD-10-CM | POA: Diagnosis not present

## 2023-12-29 DIAGNOSIS — Z96653 Presence of artificial knee joint, bilateral: Secondary | ICD-10-CM | POA: Diagnosis not present

## 2023-12-29 DIAGNOSIS — M1990 Unspecified osteoarthritis, unspecified site: Secondary | ICD-10-CM | POA: Diagnosis not present

## 2023-12-29 DIAGNOSIS — M25551 Pain in right hip: Secondary | ICD-10-CM | POA: Diagnosis not present

## 2023-12-29 DIAGNOSIS — M7501 Adhesive capsulitis of right shoulder: Secondary | ICD-10-CM | POA: Diagnosis not present

## 2023-12-29 DIAGNOSIS — Z853 Personal history of malignant neoplasm of breast: Secondary | ICD-10-CM | POA: Diagnosis not present

## 2023-12-29 DIAGNOSIS — N184 Chronic kidney disease, stage 4 (severe): Secondary | ICD-10-CM | POA: Diagnosis not present

## 2023-12-29 DIAGNOSIS — R7303 Prediabetes: Secondary | ICD-10-CM | POA: Diagnosis not present

## 2023-12-29 DIAGNOSIS — I129 Hypertensive chronic kidney disease with stage 1 through stage 4 chronic kidney disease, or unspecified chronic kidney disease: Secondary | ICD-10-CM | POA: Diagnosis not present

## 2023-12-29 DIAGNOSIS — M25552 Pain in left hip: Secondary | ICD-10-CM | POA: Diagnosis not present

## 2023-12-29 DIAGNOSIS — Z981 Arthrodesis status: Secondary | ICD-10-CM | POA: Diagnosis not present

## 2023-12-29 DIAGNOSIS — M7502 Adhesive capsulitis of left shoulder: Secondary | ICD-10-CM | POA: Diagnosis not present

## 2023-12-29 DIAGNOSIS — E559 Vitamin D deficiency, unspecified: Secondary | ICD-10-CM | POA: Diagnosis not present

## 2023-12-29 DIAGNOSIS — Z90710 Acquired absence of both cervix and uterus: Secondary | ICD-10-CM | POA: Diagnosis not present

## 2023-12-29 DIAGNOSIS — M353 Polymyalgia rheumatica: Secondary | ICD-10-CM | POA: Diagnosis not present

## 2023-12-29 DIAGNOSIS — D649 Anemia, unspecified: Secondary | ICD-10-CM | POA: Diagnosis not present

## 2023-12-30 ENCOUNTER — Telehealth: Payer: Self-pay

## 2023-12-30 DIAGNOSIS — J4531 Mild persistent asthma with (acute) exacerbation: Secondary | ICD-10-CM

## 2023-12-30 NOTE — Telephone Encounter (Signed)
 Copied from CRM [id number]. Topic: Clinical - Medical Advice >> Dec 30, 2023  2:10 PM Theodis Sato wrote: Reason for CRM: Patient is very upset that [facility] has not gotten back to her after weeks of waiting to hear back about results even after multiple attempts to try to get some help/advice on her chronic breathing problems, patient is requesting Dr. Jonny Ruiz to place referral somewhere else.   *Patient was breathing good while on the phone and declined to speak with nurse triage, but was advised to call us back if she has another bad breathing episode.*

## 2023-12-31 ENCOUNTER — Telehealth: Payer: Self-pay

## 2023-12-31 DIAGNOSIS — J45909 Unspecified asthma, uncomplicated: Secondary | ICD-10-CM | POA: Diagnosis not present

## 2023-12-31 DIAGNOSIS — D649 Anemia, unspecified: Secondary | ICD-10-CM | POA: Diagnosis not present

## 2023-12-31 DIAGNOSIS — I129 Hypertensive chronic kidney disease with stage 1 through stage 4 chronic kidney disease, or unspecified chronic kidney disease: Secondary | ICD-10-CM | POA: Diagnosis not present

## 2023-12-31 DIAGNOSIS — M25551 Pain in right hip: Secondary | ICD-10-CM | POA: Diagnosis not present

## 2023-12-31 DIAGNOSIS — K219 Gastro-esophageal reflux disease without esophagitis: Secondary | ICD-10-CM | POA: Diagnosis not present

## 2023-12-31 DIAGNOSIS — Z853 Personal history of malignant neoplasm of breast: Secondary | ICD-10-CM | POA: Diagnosis not present

## 2023-12-31 DIAGNOSIS — N184 Chronic kidney disease, stage 4 (severe): Secondary | ICD-10-CM | POA: Diagnosis not present

## 2023-12-31 DIAGNOSIS — M1990 Unspecified osteoarthritis, unspecified site: Secondary | ICD-10-CM | POA: Diagnosis not present

## 2023-12-31 DIAGNOSIS — M25552 Pain in left hip: Secondary | ICD-10-CM | POA: Diagnosis not present

## 2023-12-31 DIAGNOSIS — M7501 Adhesive capsulitis of right shoulder: Secondary | ICD-10-CM | POA: Diagnosis not present

## 2023-12-31 DIAGNOSIS — M353 Polymyalgia rheumatica: Secondary | ICD-10-CM | POA: Diagnosis not present

## 2023-12-31 DIAGNOSIS — R7303 Prediabetes: Secondary | ICD-10-CM | POA: Diagnosis not present

## 2023-12-31 DIAGNOSIS — Z981 Arthrodesis status: Secondary | ICD-10-CM | POA: Diagnosis not present

## 2023-12-31 DIAGNOSIS — M7502 Adhesive capsulitis of left shoulder: Secondary | ICD-10-CM | POA: Diagnosis not present

## 2023-12-31 DIAGNOSIS — Z90710 Acquired absence of both cervix and uterus: Secondary | ICD-10-CM | POA: Diagnosis not present

## 2023-12-31 DIAGNOSIS — Z96653 Presence of artificial knee joint, bilateral: Secondary | ICD-10-CM | POA: Diagnosis not present

## 2023-12-31 DIAGNOSIS — F4325 Adjustment disorder with mixed disturbance of emotions and conduct: Secondary | ICD-10-CM | POA: Diagnosis not present

## 2023-12-31 DIAGNOSIS — E559 Vitamin D deficiency, unspecified: Secondary | ICD-10-CM | POA: Diagnosis not present

## 2023-12-31 NOTE — Telephone Encounter (Signed)
 Ok this is done

## 2023-12-31 NOTE — Telephone Encounter (Signed)
 Copied from CRM [id number]. Topic: Clinical - Medication Question >> Dec 31, 2023  2:33 PM [place] M wrote: Reason for CRM: Christ from [facility]- Patient is wheezing at rest a little, wanting to know if she can get a trial with breathing treatment- nebulizer with albuteral.

## 2024-01-03 DIAGNOSIS — I129 Hypertensive chronic kidney disease with stage 1 through stage 4 chronic kidney disease, or unspecified chronic kidney disease: Secondary | ICD-10-CM | POA: Diagnosis not present

## 2024-01-03 DIAGNOSIS — M25551 Pain in right hip: Secondary | ICD-10-CM | POA: Diagnosis not present

## 2024-01-03 DIAGNOSIS — Z981 Arthrodesis status: Secondary | ICD-10-CM | POA: Diagnosis not present

## 2024-01-03 DIAGNOSIS — M7501 Adhesive capsulitis of right shoulder: Secondary | ICD-10-CM | POA: Diagnosis not present

## 2024-01-03 DIAGNOSIS — Z90710 Acquired absence of both cervix and uterus: Secondary | ICD-10-CM | POA: Diagnosis not present

## 2024-01-03 DIAGNOSIS — N184 Chronic kidney disease, stage 4 (severe): Secondary | ICD-10-CM | POA: Diagnosis not present

## 2024-01-03 DIAGNOSIS — M7502 Adhesive capsulitis of left shoulder: Secondary | ICD-10-CM | POA: Diagnosis not present

## 2024-01-03 DIAGNOSIS — M353 Polymyalgia rheumatica: Secondary | ICD-10-CM | POA: Diagnosis not present

## 2024-01-03 DIAGNOSIS — Z96653 Presence of artificial knee joint, bilateral: Secondary | ICD-10-CM | POA: Diagnosis not present

## 2024-01-03 DIAGNOSIS — M1990 Unspecified osteoarthritis, unspecified site: Secondary | ICD-10-CM | POA: Diagnosis not present

## 2024-01-03 DIAGNOSIS — K219 Gastro-esophageal reflux disease without esophagitis: Secondary | ICD-10-CM | POA: Diagnosis not present

## 2024-01-03 DIAGNOSIS — D649 Anemia, unspecified: Secondary | ICD-10-CM | POA: Diagnosis not present

## 2024-01-03 DIAGNOSIS — E559 Vitamin D deficiency, unspecified: Secondary | ICD-10-CM | POA: Diagnosis not present

## 2024-01-03 DIAGNOSIS — R7303 Prediabetes: Secondary | ICD-10-CM | POA: Diagnosis not present

## 2024-01-03 DIAGNOSIS — Z853 Personal history of malignant neoplasm of breast: Secondary | ICD-10-CM | POA: Diagnosis not present

## 2024-01-03 DIAGNOSIS — M25552 Pain in left hip: Secondary | ICD-10-CM | POA: Diagnosis not present

## 2024-01-03 DIAGNOSIS — J45909 Unspecified asthma, uncomplicated: Secondary | ICD-10-CM | POA: Diagnosis not present

## 2024-01-03 NOTE — Telephone Encounter (Signed)
 Ok for verbal, unless I need to order this at the pharmacy, just let me know

## 2024-01-05 ENCOUNTER — Telehealth: Payer: Self-pay

## 2024-01-05 DIAGNOSIS — M7501 Adhesive capsulitis of right shoulder: Secondary | ICD-10-CM | POA: Diagnosis not present

## 2024-01-05 DIAGNOSIS — Z96653 Presence of artificial knee joint, bilateral: Secondary | ICD-10-CM | POA: Diagnosis not present

## 2024-01-05 DIAGNOSIS — I129 Hypertensive chronic kidney disease with stage 1 through stage 4 chronic kidney disease, or unspecified chronic kidney disease: Secondary | ICD-10-CM | POA: Diagnosis not present

## 2024-01-05 DIAGNOSIS — N184 Chronic kidney disease, stage 4 (severe): Secondary | ICD-10-CM | POA: Diagnosis not present

## 2024-01-05 DIAGNOSIS — D649 Anemia, unspecified: Secondary | ICD-10-CM | POA: Diagnosis not present

## 2024-01-05 DIAGNOSIS — F4325 Adjustment disorder with mixed disturbance of emotions and conduct: Secondary | ICD-10-CM | POA: Diagnosis not present

## 2024-01-05 DIAGNOSIS — M25551 Pain in right hip: Secondary | ICD-10-CM | POA: Diagnosis not present

## 2024-01-05 DIAGNOSIS — M7502 Adhesive capsulitis of left shoulder: Secondary | ICD-10-CM | POA: Diagnosis not present

## 2024-01-05 DIAGNOSIS — M25552 Pain in left hip: Secondary | ICD-10-CM | POA: Diagnosis not present

## 2024-01-05 DIAGNOSIS — Z981 Arthrodesis status: Secondary | ICD-10-CM | POA: Diagnosis not present

## 2024-01-05 DIAGNOSIS — J45909 Unspecified asthma, uncomplicated: Secondary | ICD-10-CM | POA: Diagnosis not present

## 2024-01-05 DIAGNOSIS — R7303 Prediabetes: Secondary | ICD-10-CM | POA: Diagnosis not present

## 2024-01-05 DIAGNOSIS — M1990 Unspecified osteoarthritis, unspecified site: Secondary | ICD-10-CM | POA: Diagnosis not present

## 2024-01-05 DIAGNOSIS — K219 Gastro-esophageal reflux disease without esophagitis: Secondary | ICD-10-CM | POA: Diagnosis not present

## 2024-01-05 DIAGNOSIS — M353 Polymyalgia rheumatica: Secondary | ICD-10-CM | POA: Diagnosis not present

## 2024-01-05 DIAGNOSIS — Z853 Personal history of malignant neoplasm of breast: Secondary | ICD-10-CM | POA: Diagnosis not present

## 2024-01-05 DIAGNOSIS — E559 Vitamin D deficiency, unspecified: Secondary | ICD-10-CM | POA: Diagnosis not present

## 2024-01-05 DIAGNOSIS — Z90710 Acquired absence of both cervix and uterus: Secondary | ICD-10-CM | POA: Diagnosis not present

## 2024-01-05 NOTE — Telephone Encounter (Signed)
 Ok for verbal

## 2024-01-05 NOTE — Telephone Encounter (Unsigned)
 Copied from CRM [id number]. Topic: Clinical - Home Health Verbal Orders >> Jan 05, 2024  8:33 AM Orson Gear wrote: Caller/Agency: Grace([facility]) Callback Number: [phone] Service Requested: Occupational Therapy Frequency: once a week for four weeks then twice a week for four weeks Any new concerns about the patient? No

## 2024-01-06 NOTE — Telephone Encounter (Signed)
 Called and left voicemail giving verbals.

## 2024-01-07 ENCOUNTER — Other Ambulatory Visit: Payer: Self-pay

## 2024-01-07 DIAGNOSIS — M1990 Unspecified osteoarthritis, unspecified site: Secondary | ICD-10-CM | POA: Diagnosis not present

## 2024-01-07 DIAGNOSIS — E559 Vitamin D deficiency, unspecified: Secondary | ICD-10-CM | POA: Diagnosis not present

## 2024-01-07 DIAGNOSIS — Z853 Personal history of malignant neoplasm of breast: Secondary | ICD-10-CM | POA: Diagnosis not present

## 2024-01-07 DIAGNOSIS — M25552 Pain in left hip: Secondary | ICD-10-CM | POA: Diagnosis not present

## 2024-01-07 DIAGNOSIS — Z96653 Presence of artificial knee joint, bilateral: Secondary | ICD-10-CM | POA: Diagnosis not present

## 2024-01-07 DIAGNOSIS — M353 Polymyalgia rheumatica: Secondary | ICD-10-CM | POA: Diagnosis not present

## 2024-01-07 DIAGNOSIS — M7501 Adhesive capsulitis of right shoulder: Secondary | ICD-10-CM | POA: Diagnosis not present

## 2024-01-07 DIAGNOSIS — M7502 Adhesive capsulitis of left shoulder: Secondary | ICD-10-CM | POA: Diagnosis not present

## 2024-01-07 DIAGNOSIS — I129 Hypertensive chronic kidney disease with stage 1 through stage 4 chronic kidney disease, or unspecified chronic kidney disease: Secondary | ICD-10-CM | POA: Diagnosis not present

## 2024-01-07 DIAGNOSIS — N184 Chronic kidney disease, stage 4 (severe): Secondary | ICD-10-CM | POA: Diagnosis not present

## 2024-01-07 DIAGNOSIS — J45909 Unspecified asthma, uncomplicated: Secondary | ICD-10-CM | POA: Diagnosis not present

## 2024-01-07 DIAGNOSIS — K219 Gastro-esophageal reflux disease without esophagitis: Secondary | ICD-10-CM | POA: Diagnosis not present

## 2024-01-07 DIAGNOSIS — R7303 Prediabetes: Secondary | ICD-10-CM | POA: Diagnosis not present

## 2024-01-07 DIAGNOSIS — Z90710 Acquired absence of both cervix and uterus: Secondary | ICD-10-CM | POA: Diagnosis not present

## 2024-01-07 DIAGNOSIS — M25551 Pain in right hip: Secondary | ICD-10-CM | POA: Diagnosis not present

## 2024-01-07 DIAGNOSIS — Z981 Arthrodesis status: Secondary | ICD-10-CM | POA: Diagnosis not present

## 2024-01-07 DIAGNOSIS — D649 Anemia, unspecified: Secondary | ICD-10-CM | POA: Diagnosis not present

## 2024-01-07 MED ORDER — MAGNESIUM GLYCINATE 120 MG PO CAPS: 2.0000 | ORAL_CAPSULE | Freq: Every evening | ORAL | 3 refills | Status: DC

## 2024-01-10 DIAGNOSIS — R7303 Prediabetes: Secondary | ICD-10-CM | POA: Diagnosis not present

## 2024-01-10 DIAGNOSIS — K219 Gastro-esophageal reflux disease without esophagitis: Secondary | ICD-10-CM | POA: Diagnosis not present

## 2024-01-10 DIAGNOSIS — I129 Hypertensive chronic kidney disease with stage 1 through stage 4 chronic kidney disease, or unspecified chronic kidney disease: Secondary | ICD-10-CM | POA: Diagnosis not present

## 2024-01-10 DIAGNOSIS — E559 Vitamin D deficiency, unspecified: Secondary | ICD-10-CM | POA: Diagnosis not present

## 2024-01-10 DIAGNOSIS — M1990 Unspecified osteoarthritis, unspecified site: Secondary | ICD-10-CM | POA: Diagnosis not present

## 2024-01-10 DIAGNOSIS — M7502 Adhesive capsulitis of left shoulder: Secondary | ICD-10-CM | POA: Diagnosis not present

## 2024-01-10 DIAGNOSIS — M25551 Pain in right hip: Secondary | ICD-10-CM | POA: Diagnosis not present

## 2024-01-10 DIAGNOSIS — D649 Anemia, unspecified: Secondary | ICD-10-CM | POA: Diagnosis not present

## 2024-01-10 DIAGNOSIS — Z90710 Acquired absence of both cervix and uterus: Secondary | ICD-10-CM | POA: Diagnosis not present

## 2024-01-10 DIAGNOSIS — Z981 Arthrodesis status: Secondary | ICD-10-CM | POA: Diagnosis not present

## 2024-01-10 DIAGNOSIS — M353 Polymyalgia rheumatica: Secondary | ICD-10-CM | POA: Diagnosis not present

## 2024-01-10 DIAGNOSIS — M25552 Pain in left hip: Secondary | ICD-10-CM | POA: Diagnosis not present

## 2024-01-10 DIAGNOSIS — Z853 Personal history of malignant neoplasm of breast: Secondary | ICD-10-CM | POA: Diagnosis not present

## 2024-01-10 DIAGNOSIS — M7501 Adhesive capsulitis of right shoulder: Secondary | ICD-10-CM | POA: Diagnosis not present

## 2024-01-10 DIAGNOSIS — N184 Chronic kidney disease, stage 4 (severe): Secondary | ICD-10-CM | POA: Diagnosis not present

## 2024-01-10 DIAGNOSIS — Z96653 Presence of artificial knee joint, bilateral: Secondary | ICD-10-CM | POA: Diagnosis not present

## 2024-01-10 DIAGNOSIS — J45909 Unspecified asthma, uncomplicated: Secondary | ICD-10-CM | POA: Diagnosis not present

## 2024-01-11 ENCOUNTER — Ambulatory Visit: Payer: Self-pay

## 2024-01-11 NOTE — Telephone Encounter (Signed)
 Copied from CRM [id number]. Topic: Clinical - Red Word Triage >> Jan 11, 2024 12:24 PM Elizebeth Brooking wrote: Red Word that prompted transfer to Nurse Triage: Patient stated she has been experiencing some dizziness, no other symptoms    Chief Complaint: Dizziness, lightheadedness when getting out of bed. No vertigo. Symptoms: Above Frequency: Sunday Pertinent Negatives: Patient denies any other symptoms Disposition: [] ED /[] Urgent Care (no appt availability in office) / [x] Appointment(In office/virtual)/ []  [facility]/ [] Home Care/ [] Refused Recommended Disposition /[] [facility]/ []  Follow-up with PCP Additional Notes: Agrees with appointment.  Reason for Disposition  [1] MODERATE dizziness (e.g., interferes with normal activities) AND [2] has NOT been evaluated by doctor (or NP/PA) for this  (Exception: Dizziness caused by heat exposure, sudden standing, or poor fluid intake.)  Answer Assessment - Initial Assessment Questions 1. DESCRIPTION: "Describe your dizziness."     Dizzy 2. LIGHTHEADED: "Do you feel lightheaded?" (e.g., somewhat faint, woozy, weak upon standing)     Yes 3. VERTIGO: "Do you feel like either you or the room is spinning or tilting?" (i.e. vertigo)     No 4. SEVERITY: "How bad is it?"  "Do you feel like you are going to faint?" "Can you stand and walk?"   - MILD: Feels slightly dizzy, but walking normally.   - MODERATE: Feels unsteady when walking, but not falling; interferes with normal activities (e.g., school, work).   - SEVERE: Unable to walk without falling, or requires assistance to walk without falling; feels like passing out now.      Moderate 5. ONSET:  "When did the dizziness begin?"     Yesterday 6. AGGRAVATING FACTORS: "Does anything make it worse?" (e.g., standing, change in head position)     Getting up 7. HEART RATE: "Can you tell me your heart rate?" "How many beats in 15 seconds?"  (Note: not all patients can do this)       No 8.  CAUSE: "What do you think is causing the dizziness?"     Unsure 9. RECURRENT SYMPTOM: "Have you had dizziness before?" If Yes, ask: "When was the last time?" "What happened that time?"     No 10. OTHER SYMPTOMS: "Do you have any other symptoms?" (e.g., fever, chest pain, vomiting, diarrhea, bleeding)       No 11. PREGNANCY: "Is there any chance you are pregnant?" "When was your last menstrual period?"       No  Protocols used: Dizziness - Lightheadedness-A-AH

## 2024-01-11 NOTE — Telephone Encounter (Signed)
There is no call back number!!!!!!!!!

## 2024-01-12 ENCOUNTER — Other Ambulatory Visit: Payer: Self-pay | Admitting: Internal Medicine

## 2024-01-12 ENCOUNTER — Ambulatory Visit (INDEPENDENT_AMBULATORY_CARE_PROVIDER_SITE_OTHER)
Admission: RE | Admit: 2024-01-12 | Discharge: 2024-01-12 | Disposition: A | Discharge status: Home / Self Care | Source: Ambulatory Visit | Attending: Internal Medicine | Admitting: Internal Medicine

## 2024-01-12 ENCOUNTER — Encounter: Payer: Self-pay | Admitting: Internal Medicine

## 2024-01-12 ENCOUNTER — Ambulatory Visit (INDEPENDENT_AMBULATORY_CARE_PROVIDER_SITE_OTHER): Admitting: Internal Medicine

## 2024-01-12 VITALS — BP 140/80 | HR 61 | Temp 97.9°F | Ht 61.0 in | Wt 159.0 lb

## 2024-01-12 DIAGNOSIS — I10 Essential (primary) hypertension: Secondary | ICD-10-CM

## 2024-01-12 DIAGNOSIS — M25551 Pain in right hip: Secondary | ICD-10-CM | POA: Diagnosis not present

## 2024-01-12 DIAGNOSIS — M7502 Adhesive capsulitis of left shoulder: Secondary | ICD-10-CM | POA: Diagnosis not present

## 2024-01-12 DIAGNOSIS — I129 Hypertensive chronic kidney disease with stage 1 through stage 4 chronic kidney disease, or unspecified chronic kidney disease: Secondary | ICD-10-CM | POA: Diagnosis not present

## 2024-01-12 DIAGNOSIS — Z853 Personal history of malignant neoplasm of breast: Secondary | ICD-10-CM | POA: Diagnosis not present

## 2024-01-12 DIAGNOSIS — I5032 Chronic diastolic (congestive) heart failure: Secondary | ICD-10-CM | POA: Diagnosis not present

## 2024-01-12 DIAGNOSIS — R7303 Prediabetes: Secondary | ICD-10-CM | POA: Diagnosis not present

## 2024-01-12 DIAGNOSIS — D649 Anemia, unspecified: Secondary | ICD-10-CM | POA: Diagnosis not present

## 2024-01-12 DIAGNOSIS — M25552 Pain in left hip: Secondary | ICD-10-CM | POA: Diagnosis not present

## 2024-01-12 DIAGNOSIS — Z981 Arthrodesis status: Secondary | ICD-10-CM | POA: Diagnosis not present

## 2024-01-12 DIAGNOSIS — R0600 Dyspnea, unspecified: Secondary | ICD-10-CM | POA: Diagnosis not present

## 2024-01-12 DIAGNOSIS — Z90710 Acquired absence of both cervix and uterus: Secondary | ICD-10-CM | POA: Diagnosis not present

## 2024-01-12 DIAGNOSIS — K219 Gastro-esophageal reflux disease without esophagitis: Secondary | ICD-10-CM | POA: Diagnosis not present

## 2024-01-12 DIAGNOSIS — M7501 Adhesive capsulitis of right shoulder: Secondary | ICD-10-CM | POA: Diagnosis not present

## 2024-01-12 DIAGNOSIS — M1990 Unspecified osteoarthritis, unspecified site: Secondary | ICD-10-CM | POA: Diagnosis not present

## 2024-01-12 DIAGNOSIS — Z96653 Presence of artificial knee joint, bilateral: Secondary | ICD-10-CM | POA: Diagnosis not present

## 2024-01-12 DIAGNOSIS — F4325 Adjustment disorder with mixed disturbance of emotions and conduct: Secondary | ICD-10-CM | POA: Diagnosis not present

## 2024-01-12 DIAGNOSIS — E559 Vitamin D deficiency, unspecified: Secondary | ICD-10-CM | POA: Diagnosis not present

## 2024-01-12 DIAGNOSIS — N184 Chronic kidney disease, stage 4 (severe): Secondary | ICD-10-CM | POA: Diagnosis not present

## 2024-01-12 DIAGNOSIS — J45909 Unspecified asthma, uncomplicated: Secondary | ICD-10-CM | POA: Diagnosis not present

## 2024-01-12 DIAGNOSIS — M353 Polymyalgia rheumatica: Secondary | ICD-10-CM | POA: Diagnosis not present

## 2024-01-12 MED ORDER — ALBUTEROL SULFATE (2.5 MG/3ML) 0.083% IN NEBU: 2.5000 mg | INHALATION_SOLUTION | Freq: Four times a day (QID) | RESPIRATORY_TRACT | 1 refills | Status: DC | PRN

## 2024-01-12 MED ORDER — PREDNISONE 10 MG PO TABS: ORAL_TABLET | ORAL | 0 refills | Status: DC

## 2024-01-12 NOTE — Patient Instructions (Addendum)
 Please take all new medication as prescribed - the nebulizer medication (and the prescription for the nebulizer machine if needed)  Please continue all other medications as before, and refills have been done if requested.  Please have the pharmacy call with any other refills you may need.  Please keep your appointments with your specialists as you may have planned  Please go to the XRAY Department in the first floor for the x-ray testing -- at the [address] basement xray  You will be contacted by phone if any changes need to be made immediately.  Otherwise, you will receive a letter about your results with an explanation, but please check with MyChart first.

## 2024-01-12 NOTE — Progress Notes (Signed)
 Patient ID: [name], female   DOB: [birth year], 88 y.o.   MRN: [id number]        Chief Complaint: follow up hx of diast chf, asthma PMR and chronic steroid use, ckd 4 now with 3 days dyspnea       HPI:  [name] is a 88 y.o. female here with above with daughter, overall wt and leg swelling has been little changed it seems, but pt with 3 days onset sob hard to take deep breaths, Pt denies chest pain, wheezing, orthopnea, PND, increased LE swelling, palpitations, dizziness or syncope.    Pt denies polydipsia, polyuria, or new focal neuro s/s.    Pt denies fever, wt loss, night sweats, loss of appetite, or other constitutional symptoms  Last asthma episode recognized was 2019,  Has been on steroid tx for PMR in the intervening.         Wt Readings from Last 3 Encounters:  01/12/24 159 lb (72.1 kg)  12/24/23 151 lb (68.5 kg)  10/04/23 158 lb 9.6 oz (71.9 kg)   BP Readings from Last 3 Encounters:  01/12/24 (!) 140/80  12/24/23 110/70  10/04/23 (!) 151/82         Past Medical History:  Diagnosis Date   Allergic rhinitis 10/30/2016   Anemia    Asthma    Breast cancer of upper-outer quadrant of left female breast (HCC) 11/08/2013   ER/PR+ Her2- Left IDC    Chronic renal insufficiency    Chronic rhinitis    Colon polyp    Diastolic dysfunction 07/10/2016   DJD (degenerative joint disease)    Dyspnea    Frozen shoulder    Full dentures    GERD (gastroesophageal reflux disease)    Hearing loss    Hypertension    Hyponatremia    Impaired glucose tolerance 07/18/2014   Memory loss    Morbid obesity (HCC)    Poor circulation    Vertigo    Wears glasses    Past Surgical History:  Procedure Laterality Date   ABDOMINAL HYSTERECTOMY     BREAST LUMPECTOMY WITH NEEDLE LOCALIZATION AND AXILLARY SENTINEL LYMPH NODE BX Left 12/04/2013   Procedure: BREAST LUMPECTOMY WITH NEEDLE LOCALIZATION AND AXILLARY SENTINEL LYMPH NODE BX;  Surgeon: Kandis Cocking, MD;  Location: [facility];  Service: General;  Laterality: Left;   CATARACT EXTRACTION  2009   rt   COLONOSCOPY     EYE SURGERY Bilateral    cataract surgery   KNEE ARTHROSCOPY     both   LUMBAR LAMINECTOMY/DECOMPRESSION MICRODISCECTOMY Left 12/23/2017   Procedure: Left Lumbar One-Two Laminectomy with microdiscectomy;  Surgeon: Tia Alert, MD;  Location: [facility] OR;  Service: Neurosurgery;  Laterality: Left;  Left L1-2 Laminectomy with microdiscectomy   TONSILLECTOMY     TOTAL KNEE ARTHROPLASTY  2002   rt   TOTAL KNEE ARTHROPLASTY  2003   left   VESICOVAGINAL FISTULA CLOSURE W/ TAH  1980    reports that she has never smoked. She has never been exposed to tobacco smoke. She has never used smokeless tobacco. She reports that she does not drink alcohol and does not use drugs. family history includes Colon cancer in her mother; Healthy in her daughter; Heart attack (age of onset: 70) in her son; Lung cancer in her brother; Stomach cancer in her mother. Allergies  Allergen Reactions   Hydrocodone Itching   Lasix [Furosemide] Other (See Comments)    Dizziness.    Tizanidine  Other (See Comments)    Dizzy and fall   Iron Hives and Other (See Comments)     bad constipation    Current Outpatient Medications on File Prior to Visit  Medication Sig Dispense Refill   albuterol (VENTOLIN HFA) 108 (90 Base) MCG/ACT inhaler Inhale 1 puff into the lungs every 6 (six) hours as needed for wheezing or shortness of breath. 18 g 5   allopurinol (ZYLOPRIM) 100 MG tablet Take 100 mg by mouth daily.     amLODipine (NORVASC) 5 MG tablet TAKE 1 TABLET BY MOUTH EVERY DAY (Patient taking differently: Take 5 mg by mouth every morning.) 90 tablet 3   aspirin EC 81 MG tablet Take 81 mg by mouth every morning. Swallow whole.     celecoxib (CELEBREX) 100 MG capsule Take 100 mg by mouth 2 (two) times daily.     Cholecalciferol (VITAMIN D3) 20 MCG (800 UNIT) TABS Take 1 tablet by mouth daily.     dapagliflozin propanediol  (FARXIGA) 10 MG TABS tablet Take 1 tablet (10 mg total) by mouth daily before breakfast. 90 tablet 3   Ensure (ENSURE) Take 1 Can by mouth 3 (three) times daily between meals. (Patient taking differently: Take 237 mLs by mouth See admin instructions. Drink one can (237 mls) by mouth once or twice daily) 237 mL 12   fluticasone-salmeterol (WIXELA INHUB) 500-50 MCG/ACT AEPB Inhale 1 puff into the lungs in the morning and at bedtime. 3 each 3   furosemide (LASIX) 40 MG tablet Take 1 tablet (40 mg total) by mouth every Monday, Wednesday, and Friday.     gabapentin (NEURONTIN) 300 MG capsule TAKE 1 CAPSULE BY MOUTH THREE TIMES A DAY 90 capsule 5   Incontinence Supply Disposable (DEPEND UNDERWEAR SM/MED) MISC Use as directed four times per day 120 each 5   lidocaine (LIDODERM) 5 % Place 1 patch onto the skin daily. Remove & Discard patch within 12 hours or as directed by MD 30 patch 0   losartan (COZAAR) 100 MG tablet Take 100 mg by mouth daily.     Magnesium Glycinate 120 MG CAPS Take 2 tablets by mouth at bedtime. 90 capsule 3   meclizine (ANTIVERT) 12.5 MG tablet TAKE 1 TABLET BY MOUTH THREE TIMES A DAY AS NEEDED FOR DIZZINESS (Patient taking differently: Take 12.5 mg by mouth 3 (three) times daily as needed for dizziness.) 90 tablet 2   metoprolol succinate (TOPROL-XL) 25 MG 24 hr tablet Take 1 tablet (25 mg total) by mouth daily. 90 tablet 3   montelukast (SINGULAIR) 10 MG tablet TAKE 1 TABLET BY MOUTH EVERY DAY 90 tablet 2   Multiple Vitamin (MULTIVITAMIN WITH MINERALS) TABS tablet Take 1 tablet by mouth daily. Centrum Silver     pantoprazole (PROTONIX) 40 MG tablet TAKE 1 TABLET BY MOUTH EVERY DAY 90 tablet 3   potassium chloride (KLOR-CON) 10 MEQ tablet TAKE 1 TABLET BY MOUTH EVERY DAY WHEN TAKING FUROSEMIDE (Patient taking differently: Take 10 mEq by mouth every Monday, Wednesday, and Friday.) 90 tablet 3   predniSONE (DELTASONE) 5 MG tablet Take 1 tablet (5 mg total) by mouth daily with breakfast.  90 tablet 3   SPIKEVAX syringe      triamcinolone cream (KENALOG) 0.1 % APPLY TO AFFECTED AREA TWICE A DAY 30 g 1   febuxostat (ULORIC) 40 MG tablet Take 40 mg by mouth daily. (Patient not taking: Reported on 01/12/2024)     FLUAD QUADRIVALENT 0.5 ML injection  (Patient not taking: Reported  on 01/12/2024)     No current facility-administered medications on file prior to visit.        ROS:  All others reviewed and negative.  Objective        PE:  BP (!) 140/80 (BP Location: Left Arm, Patient Position: Sitting, Cuff Size: Normal)   Pulse 61   Temp 97.9 F (36.6 C) (Oral)   Ht 5\' 1"  (1.549 m)   Wt 159 lb (72.1 kg)   BMI 30.04 kg/m                 Constitutional: Pt appears in NAD               HENT: Head: NCAT.                Right Ear: External ear normal.                 Left Ear: External ear normal.                Eyes: . Pupils are equal, round, and reactive to light. Conjunctivae and EOM are normal               Nose: without d/c or deformity               Neck: Neck supple. Gross normal ROM               Cardiovascular: Normal rate and regular rhythm.                 Pulmonary/Chest: Effort normal and breath sounds decreased bilateral without rales or overt wheezing.                Abd:  Soft, NT, ND, + BS, no organomegaly               Neurological: Pt is alert. At baseline orientation, motor grossly intact               Skin: Skin is warm. No rashes, no other new lesions, LE edema - trace to 1+ bilateral               Psychiatric: Pt behavior is normal without agitation   Micro: none  Cardiac tracings I have personally interpreted today:  none  Pertinent Radiological findings (summarize): none   Lab Results  Component Value Date   WBC 7.5 08/24/2023   HGB 12.0 08/24/2023   HCT 37.9 08/24/2023   PLT 205.0 08/24/2023   GLUCOSE 148 (H) 09/02/2023   CHOL 219 (H) 08/24/2023   TRIG 153.0 (H) 08/24/2023   HDL 57.90 08/24/2023   LDLDIRECT 137.0 02/02/2022   LDLCALC 131  (H) 08/24/2023   ALT 27 08/24/2023   AST 25 08/24/2023   NA 141 09/02/2023   K 4.1 09/02/2023   CL 102 09/02/2023   CREATININE 1.81 (H) 09/02/2023   BUN 31 (H) 09/02/2023   CO2 33 (H) 09/02/2023   TSH 1.09 06/25/2023   INR 1.0 07/27/2021   HGBA1C 6.4 08/24/2023   Assessment/Plan:  [name] is a 88 y.o. Black or African American [2] female with  has a past medical history of Allergic rhinitis (10/30/2016), Anemia, Asthma, Breast cancer of upper-outer quadrant of left female breast (HCC) (11/08/2013), Chronic renal insufficiency, Chronic rhinitis, Colon polyp, Diastolic dysfunction (07/10/2016), DJD (degenerative joint disease), Dyspnea, Frozen shoulder, Full dentures, GERD (gastroesophageal reflux disease), Hearing loss, Hypertension, Hyponatremia, Impaired glucose tolerance (07/18/2014), Memory loss, Morbid obesity (HCC), Poor  circulation, Vertigo, and Wears glasses.  Chronic diastolic CHF (congestive heart failure) (HCC) Volume stable it seems, cont same tx  Essential hypertension BP Readings from Last 3 Encounters:  01/12/24 (!) 140/80  12/24/23 110/70  10/04/23 (!) 151/82   Uncontrolled, likely reactive, pt to continue medical treatment norvasc 5 every day, losartan 100 every day, toprol xl 25 qd   CKD (chronic kidney disease) stage 4, GFR 15-29 ml/min (HCC) Lab Results  Component Value Date   CREATININE 1.81 (H) 09/02/2023   Stable overall, cont to avoid nephrotoxins; pt has seen renal recently with labs , declines further labs today   Dyspnea Etiology unclear, ? Asthma vs volume vs other - for cxr r/o edema, consider prednisone burst and taper if negative, for DME neb machine and albuterol nebs;     Followup: Return if symptoms worsen or fail to improve.  Oliver Barre, MD 01/15/2024 2:13 PM [facility]

## 2024-01-14 ENCOUNTER — Telehealth: Payer: Self-pay | Admitting: Internal Medicine

## 2024-01-14 DIAGNOSIS — R0600 Dyspnea, unspecified: Secondary | ICD-10-CM

## 2024-01-14 NOTE — Telephone Encounter (Signed)
 Copied from CRM [id number]. Topic: Clinical - Prescription Issue >> Jan 14, 2024  3:40 PM [place] wrote: Reason for CRM: Patient daughter calling to get a prescription for a Nebulizer, there was not one called in. She is going out of town this weekend and needs it as soon as possible

## 2024-01-15 ENCOUNTER — Encounter: Payer: Self-pay | Admitting: Internal Medicine

## 2024-01-15 NOTE — Assessment & Plan Note (Signed)
 Volume stable it seems, cont same tx

## 2024-01-15 NOTE — Assessment & Plan Note (Signed)
 Etiology unclear, ? Asthma vs volume vs other - for cxr r/o edema, consider prednisone burst and taper if negative, for DME neb machine and albuterol nebs;

## 2024-01-15 NOTE — Assessment & Plan Note (Addendum)
 Lab Results  Component Value Date   CREATININE 1.81 (H) 09/02/2023   Stable overall, cont to avoid nephrotoxins; pt has seen renal recently with labs , declines further labs today

## 2024-01-15 NOTE — Assessment & Plan Note (Addendum)
 BP Readings from Last 3 Encounters:  01/12/24 (!) 140/80  12/24/23 110/70  10/04/23 (!) 151/82   Uncontrolled, likely reactive, pt to continue medical treatment norvasc 5 every day, losartan 100 every day, toprol xl 25 every day, declines change today

## 2024-01-17 DIAGNOSIS — N184 Chronic kidney disease, stage 4 (severe): Secondary | ICD-10-CM | POA: Diagnosis not present

## 2024-01-17 NOTE — Telephone Encounter (Signed)
 Rx for such was clearly given at time of visit - no need for repeat hardcopy script

## 2024-01-17 NOTE — Telephone Encounter (Signed)
 Copied from CRM [id number]. Topic: Clinical - Prescription Issue >> Jan 14, 2024  3:40 PM [place] wrote: Reason for CRM: Patient daughter calling to get a prescription for a Nebulizer, there was not one called in. She is going out of town this weekend and needs it as soon as possible >> Jan 17, 2024  1:35 PM [place] E wrote: Patient's daughter, Starleen Blue, called in regarding this issue saying they still have not been able to pick up the nebulizer machine, they have the solution, just not the machine. Daughter became very agitated with patient and threatened her attorney. Please call patient's daughter back and advise.

## 2024-01-17 NOTE — Telephone Encounter (Signed)
 Pt and daughter calling again. They state they do not have the hard copy of the nebulizer machine rx and need another one.  Please either provide a new copy or send it to the pharmacy.   Both pt and her daughter are very upset at all the delays.

## 2024-01-17 NOTE — Telephone Encounter (Signed)
 Pt is still waiting on her nebulizer machine rx and went through the weekend unable to use the solution. Please send RX for machine ASAP.

## 2024-01-18 ENCOUNTER — Ambulatory Visit: Payer: Self-pay | Admitting: *Deleted

## 2024-01-18 DIAGNOSIS — M7502 Adhesive capsulitis of left shoulder: Secondary | ICD-10-CM | POA: Diagnosis not present

## 2024-01-18 DIAGNOSIS — M25552 Pain in left hip: Secondary | ICD-10-CM | POA: Diagnosis not present

## 2024-01-18 DIAGNOSIS — J45909 Unspecified asthma, uncomplicated: Secondary | ICD-10-CM | POA: Diagnosis not present

## 2024-01-18 DIAGNOSIS — R7303 Prediabetes: Secondary | ICD-10-CM | POA: Diagnosis not present

## 2024-01-18 DIAGNOSIS — E559 Vitamin D deficiency, unspecified: Secondary | ICD-10-CM | POA: Diagnosis not present

## 2024-01-18 DIAGNOSIS — Z981 Arthrodesis status: Secondary | ICD-10-CM | POA: Diagnosis not present

## 2024-01-18 DIAGNOSIS — I129 Hypertensive chronic kidney disease with stage 1 through stage 4 chronic kidney disease, or unspecified chronic kidney disease: Secondary | ICD-10-CM | POA: Diagnosis not present

## 2024-01-18 DIAGNOSIS — M353 Polymyalgia rheumatica: Secondary | ICD-10-CM | POA: Diagnosis not present

## 2024-01-18 DIAGNOSIS — M7501 Adhesive capsulitis of right shoulder: Secondary | ICD-10-CM | POA: Diagnosis not present

## 2024-01-18 DIAGNOSIS — M1990 Unspecified osteoarthritis, unspecified site: Secondary | ICD-10-CM | POA: Diagnosis not present

## 2024-01-18 DIAGNOSIS — K219 Gastro-esophageal reflux disease without esophagitis: Secondary | ICD-10-CM | POA: Diagnosis not present

## 2024-01-18 DIAGNOSIS — Z96653 Presence of artificial knee joint, bilateral: Secondary | ICD-10-CM | POA: Diagnosis not present

## 2024-01-18 DIAGNOSIS — Z853 Personal history of malignant neoplasm of breast: Secondary | ICD-10-CM | POA: Diagnosis not present

## 2024-01-18 DIAGNOSIS — N184 Chronic kidney disease, stage 4 (severe): Secondary | ICD-10-CM | POA: Diagnosis not present

## 2024-01-18 DIAGNOSIS — Z90710 Acquired absence of both cervix and uterus: Secondary | ICD-10-CM | POA: Diagnosis not present

## 2024-01-18 DIAGNOSIS — D649 Anemia, unspecified: Secondary | ICD-10-CM | POA: Diagnosis not present

## 2024-01-18 DIAGNOSIS — M25551 Pain in right hip: Secondary | ICD-10-CM | POA: Diagnosis not present

## 2024-01-18 NOTE — Telephone Encounter (Signed)
 See note: [place]   Contact [phone] Chief Complaint: Request: Rx nebulizer machine Symptoms: SOB with activity- needs nebulizer    Disposition: [] ED /[] Urgent Care (no appt availability in office) / [] Appointment(In office/virtual)/ []  [facility]/ [] Home Care/ [] Refused Recommended Disposition /[]  Mobile Bus/ [x]  Follow-up with PCP Additional Notes:   Viviann Spare. PTA  [facility] agency) calling: Reporting patient needs nebulizer machine- verified patient has Rx for solution sent to pharmacy- no nebulizer machine in the home.  Patient unable to use inhaler properly- limited hand strength. Patient would benefit from nebulizer. SOB with little activity and feels that would be beneficail to keeping patient in the home- O2 sat - in low 90's now- believes nebulizer would help bring these numbers up.  Verified results from chest X-ray and prednisone Rx.  [facility]   Copied from CRM [id number]. Topic: Clinical - Red Word Triage >> Jan 18, 2024 10:27 AM Elizebeth Brooking wrote: Red Word that prompted transfer to Nurse Triage:Home health nurse called stating Been complaining shortness of birth for a couple of weeks now. Reason for Disposition  [1] Follow-up call to recent contact AND [2] information only call, no triage required  Answer Assessment - Initial Assessment Questions 1. REASON FOR CALL or QUESTION: "What is your reason for calling today?" or "How can I best help you?" or "What question do you have that I can help answer?"     Viviann Spare. PTA  [facility] agency) calling: Reporting patient needs nebulizer machine- verified patient has Rx for solution sent to pharmacy- no nebulizer machine in the home.  Patient unable to use inhaler properly- limited hand strength. Patient would benefit from nebulizer. SOB with little activity and feels that would be beneficail to keeping patient in the home- O2 sat - in low 90's  now- believes nebulizer would help bring these numbers up.  Verified results from chest X-ray and prednisone Rx.  [facility]  Protocols used: Information Only Call - No Triage-A-AH

## 2024-01-18 NOTE — Telephone Encounter (Signed)
 They only have themselves to blame as I gave the hardcopy written order at the visit.   Please let them know that I am disapointed this was not realized at their last visit, and to check the paperwork again that I gave them at the end visit  We are unable to send DME scripts on EPIC, so I am required to do this again harcopy.  Will do now to [facility]

## 2024-01-19 ENCOUNTER — Ambulatory Visit: Payer: Self-pay

## 2024-01-19 DIAGNOSIS — E559 Vitamin D deficiency, unspecified: Secondary | ICD-10-CM | POA: Diagnosis not present

## 2024-01-19 DIAGNOSIS — M25552 Pain in left hip: Secondary | ICD-10-CM | POA: Diagnosis not present

## 2024-01-19 DIAGNOSIS — D649 Anemia, unspecified: Secondary | ICD-10-CM | POA: Diagnosis not present

## 2024-01-19 DIAGNOSIS — F4325 Adjustment disorder with mixed disturbance of emotions and conduct: Secondary | ICD-10-CM | POA: Diagnosis not present

## 2024-01-19 DIAGNOSIS — Z96653 Presence of artificial knee joint, bilateral: Secondary | ICD-10-CM | POA: Diagnosis not present

## 2024-01-19 DIAGNOSIS — M1990 Unspecified osteoarthritis, unspecified site: Secondary | ICD-10-CM | POA: Diagnosis not present

## 2024-01-19 DIAGNOSIS — I129 Hypertensive chronic kidney disease with stage 1 through stage 4 chronic kidney disease, or unspecified chronic kidney disease: Secondary | ICD-10-CM | POA: Diagnosis not present

## 2024-01-19 DIAGNOSIS — M7502 Adhesive capsulitis of left shoulder: Secondary | ICD-10-CM | POA: Diagnosis not present

## 2024-01-19 DIAGNOSIS — M25551 Pain in right hip: Secondary | ICD-10-CM | POA: Diagnosis not present

## 2024-01-19 DIAGNOSIS — M353 Polymyalgia rheumatica: Secondary | ICD-10-CM | POA: Diagnosis not present

## 2024-01-19 DIAGNOSIS — N184 Chronic kidney disease, stage 4 (severe): Secondary | ICD-10-CM | POA: Diagnosis not present

## 2024-01-19 DIAGNOSIS — R7303 Prediabetes: Secondary | ICD-10-CM | POA: Diagnosis not present

## 2024-01-19 DIAGNOSIS — J45909 Unspecified asthma, uncomplicated: Secondary | ICD-10-CM | POA: Diagnosis not present

## 2024-01-19 DIAGNOSIS — K219 Gastro-esophageal reflux disease without esophagitis: Secondary | ICD-10-CM | POA: Diagnosis not present

## 2024-01-19 DIAGNOSIS — M7501 Adhesive capsulitis of right shoulder: Secondary | ICD-10-CM | POA: Diagnosis not present

## 2024-01-19 DIAGNOSIS — Z90710 Acquired absence of both cervix and uterus: Secondary | ICD-10-CM | POA: Diagnosis not present

## 2024-01-19 DIAGNOSIS — Z853 Personal history of malignant neoplasm of breast: Secondary | ICD-10-CM | POA: Diagnosis not present

## 2024-01-19 DIAGNOSIS — Z981 Arthrodesis status: Secondary | ICD-10-CM | POA: Diagnosis not present

## 2024-01-19 NOTE — Telephone Encounter (Signed)
 Called and left voicemail letting PT know the prescription is ready for pickup.

## 2024-01-19 NOTE — Telephone Encounter (Signed)
 Called and left voicemail letting Pt know the Prescription is ready for pickup and will be placed upfront .

## 2024-01-19 NOTE — Telephone Encounter (Signed)
 Chief Complaint: Shortness of breath Symptoms: wheezing with shortness of breath Frequency: started last week Pertinent Negatives: Patient denies CP Disposition: [] ED /[] Urgent Care (no appt availability in office) / [] Appointment(In office/virtual)/ []  [place] [facility]/ [] Home Care/ [x] Refused Recommended Disposition /[] [place] [facility]/ [x]  Follow-up with PCP Additional Notes: patient and daughter on the phone-patient states she has been having wheezing and shortness of breath. Patient had run out of rescue inhaler which the daughter had to pay out of pocket for. Patient reports feeling better with her rescue inhaler. Per protocol, patient is recommended to UC for evaluation but patient's daughter refused. Daughter then went a very long conversation about her mother's care whether her primary is at [employer] vs [facility]. Daughter was very frustrated that her mother still didn't have a nebulizer machine that was suppose to have been ordered on 01/12/2024. Upon chart review, this RN did find the nebulizer order on paperwork from office. With assistance from supervisor, message sent to Admin pool for assistance with nebulizer. Daughter is demanding phone call from admin pool about the nebulizer after 9:30 am tomorrow. Daughter is very frustrated with her mother's care. Daughter verbalized understanding of plan and all questions answered.    Copied from CRM [id number]. Topic: Appointments - Appointment Scheduling >> Jan 19, 2024  4:08 PM Renie Ora wrote: Patient is calling with wheezing and breathing. Reason for Disposition  [1] MILD difficulty breathing (e.g., minimal/no SOB at rest, SOB with walking, pulse <100) AND [2] NEW-onset or WORSE than normal  Answer Assessment - Initial Assessment Questions 1. RESPIRATORY STATUS: "Describe your breathing?" (e.g., wheezing, shortness of breath, unable to speak, severe coughing)      Wheezing and shortness of breath 2. ONSET: "When did  this breathing problem begin?"      Symptoms started to increase last week 3. PATTERN "Does the difficult breathing come and go, or has it been constant since it started?"      constant 4. SEVERITY: "How bad is your breathing?" (e.g., mild, moderate, severe)    - MILD: No SOB at rest, mild SOB with walking, speaks normally in sentences, can lie down, no retractions, pulse < 100.    - MODERATE: SOB at rest, SOB with minimal exertion and prefers to sit, cannot lie down flat, speaks in phrases, mild retractions, audible wheezing, pulse 100-120.    - SEVERE: Very SOB at rest, speaks in single words, struggling to breathe, sitting hunched forward, retractions, pulse > 120      Moderate 5. RECURRENT SYMPTOM: "Have you had difficulty breathing before?" If Yes, ask: "When was the last time?" and "What happened that time?"      Has been going on for awhile 6. CARDIAC HISTORY: "Do you have any history of heart disease?" (e.g., heart attack, angina, bypass surgery, angioplasty)      CHF 7. LUNG HISTORY: "Do you have any history of lung disease?"  (e.g., pulmonary embolus, asthma, emphysema)     asthma 8. CAUSE: "What do you think is causing the breathing problem?"      unsure 9. OTHER SYMPTOMS: "Do you have any other symptoms? (e.g., dizziness, runny nose, cough, chest pain, fever)     dizziness 10. O2 SATURATION MONITOR:  "Do you use an oxygen saturation monitor (pulse oximeter) at home?" If Yes, ask: "What is your reading (oxygen level) today?" "What is your usual oxygen saturation reading?" (e.g., 95%)       N/A 12. TRAVEL: "Have you traveled out of the  country in the last month?" (e.g., travel history, exposures)       No  Protocols used: Breathing Difficulty-A-AH

## 2024-01-20 NOTE — Telephone Encounter (Signed)
 Copied from CRM [id number]. Topic: Clinical - Prescription Issue >> Jan 20, 2024  9:15 AM Sim Boast F wrote: Reason for CRM: [facility] to get an update on patients nebulizer, I let them know that the office called and left patient voicemail yesterday 01/19/24 letting Pt know the Prescription is ready for pickup and will be placed upfront. Please try calling patient again to let her know.

## 2024-01-21 NOTE — Telephone Encounter (Signed)
 Pt has picked up the hardcopy prescription for the nebulizer machine.

## 2024-01-24 ENCOUNTER — Telehealth: Payer: Self-pay | Admitting: Internal Medicine

## 2024-01-24 DIAGNOSIS — J45909 Unspecified asthma, uncomplicated: Secondary | ICD-10-CM | POA: Diagnosis not present

## 2024-01-24 DIAGNOSIS — D649 Anemia, unspecified: Secondary | ICD-10-CM | POA: Diagnosis not present

## 2024-01-24 DIAGNOSIS — M7502 Adhesive capsulitis of left shoulder: Secondary | ICD-10-CM | POA: Diagnosis not present

## 2024-01-24 DIAGNOSIS — I129 Hypertensive chronic kidney disease with stage 1 through stage 4 chronic kidney disease, or unspecified chronic kidney disease: Secondary | ICD-10-CM | POA: Diagnosis not present

## 2024-01-24 DIAGNOSIS — M1990 Unspecified osteoarthritis, unspecified site: Secondary | ICD-10-CM | POA: Diagnosis not present

## 2024-01-24 DIAGNOSIS — M25551 Pain in right hip: Secondary | ICD-10-CM | POA: Diagnosis not present

## 2024-01-24 DIAGNOSIS — Z96653 Presence of artificial knee joint, bilateral: Secondary | ICD-10-CM | POA: Diagnosis not present

## 2024-01-24 DIAGNOSIS — R7303 Prediabetes: Secondary | ICD-10-CM | POA: Diagnosis not present

## 2024-01-24 DIAGNOSIS — K219 Gastro-esophageal reflux disease without esophagitis: Secondary | ICD-10-CM | POA: Diagnosis not present

## 2024-01-24 DIAGNOSIS — M25552 Pain in left hip: Secondary | ICD-10-CM | POA: Diagnosis not present

## 2024-01-24 DIAGNOSIS — M353 Polymyalgia rheumatica: Secondary | ICD-10-CM | POA: Diagnosis not present

## 2024-01-24 DIAGNOSIS — Z90710 Acquired absence of both cervix and uterus: Secondary | ICD-10-CM | POA: Diagnosis not present

## 2024-01-24 DIAGNOSIS — M7501 Adhesive capsulitis of right shoulder: Secondary | ICD-10-CM | POA: Diagnosis not present

## 2024-01-24 DIAGNOSIS — Z981 Arthrodesis status: Secondary | ICD-10-CM | POA: Diagnosis not present

## 2024-01-24 DIAGNOSIS — Z853 Personal history of malignant neoplasm of breast: Secondary | ICD-10-CM | POA: Diagnosis not present

## 2024-01-24 DIAGNOSIS — E559 Vitamin D deficiency, unspecified: Secondary | ICD-10-CM | POA: Diagnosis not present

## 2024-01-24 DIAGNOSIS — N184 Chronic kidney disease, stage 4 (severe): Secondary | ICD-10-CM | POA: Diagnosis not present

## 2024-01-24 NOTE — Telephone Encounter (Signed)
 Ok to follow, no new orders

## 2024-01-24 NOTE — Telephone Encounter (Signed)
 Copied from CRM [id number]. Topic: Clinical - Home Health Verbal Orders >> Jan 21, 2024  5:12 PM Myrtice Lauth wrote: Caller/Agency: [facility] Callback Number: [phone] Service Requested: Skilled Nursing Frequency: called to report pt fell yesterday  Any new concerns about the patient? No no injuries to report

## 2024-01-25 DIAGNOSIS — J302 Other seasonal allergic rhinitis: Secondary | ICD-10-CM | POA: Diagnosis not present

## 2024-01-25 DIAGNOSIS — R0602 Shortness of breath: Secondary | ICD-10-CM | POA: Diagnosis not present

## 2024-01-25 DIAGNOSIS — N184 Chronic kidney disease, stage 4 (severe): Secondary | ICD-10-CM | POA: Diagnosis not present

## 2024-01-27 DIAGNOSIS — M1990 Unspecified osteoarthritis, unspecified site: Secondary | ICD-10-CM | POA: Diagnosis not present

## 2024-01-27 DIAGNOSIS — Z90710 Acquired absence of both cervix and uterus: Secondary | ICD-10-CM | POA: Diagnosis not present

## 2024-01-27 DIAGNOSIS — K219 Gastro-esophageal reflux disease without esophagitis: Secondary | ICD-10-CM | POA: Diagnosis not present

## 2024-01-27 DIAGNOSIS — N184 Chronic kidney disease, stage 4 (severe): Secondary | ICD-10-CM | POA: Diagnosis not present

## 2024-01-27 DIAGNOSIS — R7303 Prediabetes: Secondary | ICD-10-CM | POA: Diagnosis not present

## 2024-01-27 DIAGNOSIS — Z981 Arthrodesis status: Secondary | ICD-10-CM | POA: Diagnosis not present

## 2024-01-27 DIAGNOSIS — E559 Vitamin D deficiency, unspecified: Secondary | ICD-10-CM | POA: Diagnosis not present

## 2024-01-27 DIAGNOSIS — Z853 Personal history of malignant neoplasm of breast: Secondary | ICD-10-CM | POA: Diagnosis not present

## 2024-01-27 DIAGNOSIS — M353 Polymyalgia rheumatica: Secondary | ICD-10-CM | POA: Diagnosis not present

## 2024-01-27 DIAGNOSIS — Z96653 Presence of artificial knee joint, bilateral: Secondary | ICD-10-CM | POA: Diagnosis not present

## 2024-01-27 DIAGNOSIS — M25552 Pain in left hip: Secondary | ICD-10-CM | POA: Diagnosis not present

## 2024-01-27 DIAGNOSIS — M7502 Adhesive capsulitis of left shoulder: Secondary | ICD-10-CM | POA: Diagnosis not present

## 2024-01-27 DIAGNOSIS — M7501 Adhesive capsulitis of right shoulder: Secondary | ICD-10-CM | POA: Diagnosis not present

## 2024-01-27 DIAGNOSIS — D649 Anemia, unspecified: Secondary | ICD-10-CM | POA: Diagnosis not present

## 2024-01-27 DIAGNOSIS — J45909 Unspecified asthma, uncomplicated: Secondary | ICD-10-CM | POA: Diagnosis not present

## 2024-01-27 DIAGNOSIS — M25551 Pain in right hip: Secondary | ICD-10-CM | POA: Diagnosis not present

## 2024-01-27 DIAGNOSIS — I129 Hypertensive chronic kidney disease with stage 1 through stage 4 chronic kidney disease, or unspecified chronic kidney disease: Secondary | ICD-10-CM | POA: Diagnosis not present

## 2024-01-28 DIAGNOSIS — Z1231 Encounter for screening mammogram for malignant neoplasm of breast: Secondary | ICD-10-CM | POA: Diagnosis not present

## 2024-01-28 LAB — HM MAMMOGRAPHY

## 2024-01-31 DIAGNOSIS — F4325 Adjustment disorder with mixed disturbance of emotions and conduct: Secondary | ICD-10-CM | POA: Diagnosis not present

## 2024-02-01 ENCOUNTER — Encounter: Payer: Self-pay | Admitting: Internal Medicine

## 2024-02-01 ENCOUNTER — Ambulatory Visit: Payer: Self-pay

## 2024-02-01 ENCOUNTER — Ambulatory Visit (INDEPENDENT_AMBULATORY_CARE_PROVIDER_SITE_OTHER): Admitting: Internal Medicine

## 2024-02-01 VITALS — BP 120/68 | HR 56 | Temp 98.1°F | Ht 61.0 in | Wt 157.0 lb

## 2024-02-01 DIAGNOSIS — R062 Wheezing: Secondary | ICD-10-CM | POA: Diagnosis not present

## 2024-02-01 DIAGNOSIS — M353 Polymyalgia rheumatica: Secondary | ICD-10-CM | POA: Diagnosis not present

## 2024-02-01 DIAGNOSIS — K921 Melena: Secondary | ICD-10-CM | POA: Diagnosis not present

## 2024-02-01 DIAGNOSIS — M25552 Pain in left hip: Secondary | ICD-10-CM | POA: Diagnosis not present

## 2024-02-01 DIAGNOSIS — R197 Diarrhea, unspecified: Secondary | ICD-10-CM | POA: Diagnosis not present

## 2024-02-01 DIAGNOSIS — M1990 Unspecified osteoarthritis, unspecified site: Secondary | ICD-10-CM | POA: Diagnosis not present

## 2024-02-01 DIAGNOSIS — J45909 Unspecified asthma, uncomplicated: Secondary | ICD-10-CM | POA: Diagnosis not present

## 2024-02-01 DIAGNOSIS — D649 Anemia, unspecified: Secondary | ICD-10-CM | POA: Diagnosis not present

## 2024-02-01 DIAGNOSIS — R7303 Prediabetes: Secondary | ICD-10-CM | POA: Diagnosis not present

## 2024-02-01 DIAGNOSIS — M7502 Adhesive capsulitis of left shoulder: Secondary | ICD-10-CM | POA: Diagnosis not present

## 2024-02-01 DIAGNOSIS — K219 Gastro-esophageal reflux disease without esophagitis: Secondary | ICD-10-CM | POA: Diagnosis not present

## 2024-02-01 DIAGNOSIS — M7501 Adhesive capsulitis of right shoulder: Secondary | ICD-10-CM | POA: Diagnosis not present

## 2024-02-01 DIAGNOSIS — N184 Chronic kidney disease, stage 4 (severe): Secondary | ICD-10-CM | POA: Diagnosis not present

## 2024-02-01 DIAGNOSIS — Z853 Personal history of malignant neoplasm of breast: Secondary | ICD-10-CM | POA: Diagnosis not present

## 2024-02-01 DIAGNOSIS — Z981 Arthrodesis status: Secondary | ICD-10-CM | POA: Diagnosis not present

## 2024-02-01 DIAGNOSIS — Z96653 Presence of artificial knee joint, bilateral: Secondary | ICD-10-CM | POA: Diagnosis not present

## 2024-02-01 DIAGNOSIS — I129 Hypertensive chronic kidney disease with stage 1 through stage 4 chronic kidney disease, or unspecified chronic kidney disease: Secondary | ICD-10-CM | POA: Diagnosis not present

## 2024-02-01 DIAGNOSIS — M25551 Pain in right hip: Secondary | ICD-10-CM | POA: Diagnosis not present

## 2024-02-01 DIAGNOSIS — E559 Vitamin D deficiency, unspecified: Secondary | ICD-10-CM | POA: Diagnosis not present

## 2024-02-01 DIAGNOSIS — I5032 Chronic diastolic (congestive) heart failure: Secondary | ICD-10-CM | POA: Diagnosis not present

## 2024-02-01 DIAGNOSIS — Z90710 Acquired absence of both cervix and uterus: Secondary | ICD-10-CM | POA: Diagnosis not present

## 2024-02-01 MED ORDER — HYDROCORTISONE (PERIANAL) 2.5 % EX CREA: 1.0000 | TOPICAL_CREAM | Freq: Two times a day (BID) | CUTANEOUS | 1 refills | Status: DC

## 2024-02-01 MED ORDER — PREDNISONE 10 MG PO TABS: ORAL_TABLET | ORAL | 0 refills | Status: DC

## 2024-02-01 MED ORDER — DIPHENOXYLATE-ATROPINE 2.5-0.025 MG PO TABS: 1.0000 | ORAL_TABLET | Freq: Four times a day (QID) | ORAL | 1 refills | Status: DC | PRN

## 2024-02-01 NOTE — Assessment & Plan Note (Signed)
 Without pain, fever - for prn lomotil

## 2024-02-01 NOTE — Assessment & Plan Note (Signed)
 Mild to mod, for repeat prednisone taper,,  to f/u any worsening symptoms or concerns

## 2024-02-01 NOTE — Progress Notes (Signed)
 Patient ID: [name], female   DOB: 1936-02-07, 88 y.o.   MRN: [id number]        Chief Complaint: follow up rectal bleeding, diarrhea, asthma       HPI:  [name] is a 88 y.o. female here with c/o small spot of blood noted on pant per rectum 1 wk ago and resolved, but then started again yesterday more overt and volume but still mild it seems. Has some rectal discomfort tenderness,  Did have some anusol HC cream leftover and helped but now out of med.  Also has had this wk worsening diarrhea without pain, fever mostly watery and food just runs through her, and immodium not always controlling.  Breathing has improved since last visit with the prednisone, but now worse again in the past 2 days.  Pt denies chest pain, orthopnea, PND, increased LE swelling, palpitations, dizziness or syncope.   Pt denies polydipsia, polyuria, or new focal neuro s/s.         Wt Readings from Last 3 Encounters:  02/01/24 157 lb (71.2 kg)  01/12/24 159 lb (72.1 kg)  12/24/23 151 lb (68.5 kg)   BP Readings from Last 3 Encounters:  02/01/24 120/68  01/12/24 (!) 140/80  12/24/23 110/70         Past Medical History:  Diagnosis Date   Allergic rhinitis 10/30/2016   Anemia    Asthma    Breast cancer of upper-outer quadrant of left female breast (HCC) 11/08/2013   ER/PR+ Her2- Left IDC    Chronic renal insufficiency    Chronic rhinitis    Colon polyp    Diastolic dysfunction 07/10/2016   DJD (degenerative joint disease)    Dyspnea    Frozen shoulder    Full dentures    GERD (gastroesophageal reflux disease)    Hearing loss    Hypertension    Hyponatremia    Impaired glucose tolerance 07/18/2014   Memory loss    Morbid obesity (HCC)    Poor circulation    Vertigo    Wears glasses    Past Surgical History:  Procedure Laterality Date   ABDOMINAL HYSTERECTOMY     BREAST LUMPECTOMY WITH NEEDLE LOCALIZATION AND AXILLARY SENTINEL LYMPH NODE BX Left 12/04/2013   Procedure: BREAST  LUMPECTOMY WITH NEEDLE LOCALIZATION AND AXILLARY SENTINEL LYMPH NODE BX;  Surgeon: Kandis Cocking, MD;  Location: [facility];  Service: General;  Laterality: Left;   CATARACT EXTRACTION  2009   rt   COLONOSCOPY     EYE SURGERY Bilateral    cataract surgery   KNEE ARTHROSCOPY     both   LUMBAR LAMINECTOMY/DECOMPRESSION MICRODISCECTOMY Left 12/23/2017   Procedure: Left Lumbar One-Two Laminectomy with microdiscectomy;  Surgeon: Tia Alert, MD;  Location: [facility] [address] [facility];  Service: Neurosurgery;  Laterality: Left;  Left L1-2 Laminectomy with microdiscectomy   TONSILLECTOMY     TOTAL KNEE ARTHROPLASTY  2002   rt   TOTAL KNEE ARTHROPLASTY  2003   left   VESICOVAGINAL FISTULA CLOSURE W/ TAH  1980    reports that she has never smoked. She has never been exposed to tobacco smoke. She has never used smokeless tobacco. She reports that she does not drink alcohol and does not use drugs. family history includes Colon cancer in her mother; Healthy in her daughter; Heart attack (age of onset: 67) in her son; Lung cancer in her brother; Stomach cancer in her mother. Allergies  Allergen Reactions   Hydrocodone Itching  Hydrocodone-Acetaminophen Other (See Comments)   Lasix [Furosemide] Other (See Comments)    Dizziness.    Tizanidine Other (See Comments)    Dizzy and fall   Tizanidine Hcl Other (See Comments)   Iron Hives and Other (See Comments)     bad constipation    Current Outpatient Medications on File Prior to Visit  Medication Sig Dispense Refill   albuterol (PROVENTIL) (2.5 MG/3ML) 0.083% nebulizer solution Take 3 mLs (2.5 mg total) by nebulization every 6 (six) hours as needed for wheezing or shortness of breath. 150 mL 1   albuterol (VENTOLIN HFA) 108 (90 Base) MCG/ACT inhaler Inhale 1 puff into the lungs every 6 (six) hours as needed for wheezing or shortness of breath. 18 g 5   allopurinol (ZYLOPRIM) 100 MG tablet Take 100 mg by mouth daily.     amLODipine (NORVASC) 5 MG  tablet TAKE 1 TABLET BY MOUTH EVERY DAY (Patient taking differently: Take 5 mg by mouth every morning.) 90 tablet 3   aspirin EC 81 MG tablet Take 81 mg by mouth every morning. Swallow whole.     celecoxib (CELEBREX) 100 MG capsule Take 100 mg by mouth 2 (two) times daily.     Cholecalciferol (VITAMIN D3) 20 MCG (800 UNIT) TABS Take 1 tablet by mouth daily.     dapagliflozin propanediol (FARXIGA) 10 MG TABS tablet Take 1 tablet (10 mg total) by mouth daily before breakfast. 90 tablet 3   Ensure (ENSURE) Take 1 Can by mouth 3 (three) times daily between meals. (Patient taking differently: Take 237 mLs by mouth See admin instructions. Drink one can (237 mls) by mouth once or twice daily) 237 mL 12   febuxostat (ULORIC) 40 MG tablet Take 40 mg by mouth daily.     FLUAD QUADRIVALENT 0.5 ML injection      fluticasone (FLONASE) 50 MCG/ACT nasal spray Place into both nostrils.     fluticasone-salmeterol (WIXELA INHUB) 500-50 MCG/ACT AEPB Inhale 1 puff into the lungs in the morning and at bedtime. 3 each 3   furosemide (LASIX) 40 MG tablet Take 1 tablet (40 mg total) by mouth every Monday, Wednesday, and Friday.     gabapentin (NEURONTIN) 300 MG capsule TAKE 1 CAPSULE BY MOUTH THREE TIMES A DAY 90 capsule 5   Incontinence Supply Disposable (DEPEND UNDERWEAR SM/MED) MISC Use as directed four times per day 120 each 5   lidocaine (LIDODERM) 5 % Place 1 patch onto the skin daily. Remove & Discard patch within 12 hours or as directed by MD 30 patch 0   losartan (COZAAR) 100 MG tablet Take 100 mg by mouth daily.     Magnesium Glycinate 120 MG CAPS Take 2 tablets by mouth at bedtime. 90 capsule 3   meclizine (ANTIVERT) 12.5 MG tablet TAKE 1 TABLET BY MOUTH THREE TIMES A DAY AS NEEDED FOR DIZZINESS (Patient taking differently: Take 12.5 mg by mouth 3 (three) times daily as needed for dizziness.) 90 tablet 2   metoprolol succinate (TOPROL-XL) 25 MG 24 hr tablet Take 1 tablet (25 mg total) by mouth daily. 90 tablet 3    montelukast (SINGULAIR) 10 MG tablet TAKE 1 TABLET BY MOUTH EVERY DAY 90 tablet 2   Multiple Vitamin (MULTIVITAMIN WITH MINERALS) TABS tablet Take 1 tablet by mouth daily. Centrum Silver     pantoprazole (PROTONIX) 40 MG tablet TAKE 1 TABLET BY MOUTH EVERY DAY 90 tablet 3   potassium chloride (KLOR-CON) 10 MEQ tablet TAKE 1 TABLET BY MOUTH EVERY DAY  WHEN TAKING FUROSEMIDE (Patient taking differently: Take 10 mEq by mouth every Monday, Wednesday, and Friday.) 90 tablet 3   predniSONE (DELTASONE) 10 MG tablet 3 tabs by mouth per day for 3 days,2tabs per day for 3 days,1tab per day for 3 days 18 tablet 0   predniSONE (DELTASONE) 5 MG tablet Take 1 tablet (5 mg total) by mouth daily with breakfast. 90 tablet 3   SPIKEVAX syringe      triamcinolone cream (KENALOG) 0.1 % APPLY TO AFFECTED AREA TWICE A DAY 30 g 1   No current facility-administered medications on file prior to visit.        ROS:  All others reviewed and negative.  Objective        PE:  BP 120/68 (BP Location: Left Arm, Patient Position: Sitting, Cuff Size: Normal)   Pulse (!) 56   Temp 98.1 F (36.7 C) (Oral)   Ht 5\' 1"  (1.549 m)   Wt 157 lb (71.2 kg)   SpO2 99%   BMI 29.66 kg/m                 Constitutional: Pt appears in NAD               HENT: Head: NCAT.                Right Ear: External ear normal.                 Left Ear: External ear normal.                Eyes: . Pupils are equal, round, and reactive to light. Conjunctivae and EOM are normal               Nose: without d/c or deformity               Neck: Neck supple. Gross normal ROM               Cardiovascular: Normal rate and regular rhythm.                 Pulmonary/Chest: Effort normal and breath sounds without rales with few bilat wheezing.                Abd:  Soft, NT, ND, + BS, no organomegaly               Neurological: Pt is alert. At baseline orientation, motor grossly intact               Skin: Skin is warm. No rashes, no other new lesions, LE  edema - none               Psychiatric: Pt behavior is normal without agitation   Micro: none  Cardiac tracings I have personally interpreted today:  none  Pertinent Radiological findings (summarize): none   Lab Results  Component Value Date   WBC 7.5 08/24/2023   HGB 12.0 08/24/2023   HCT 37.9 08/24/2023   PLT 205.0 08/24/2023   GLUCOSE 148 (H) 09/02/2023   CHOL 219 (H) 08/24/2023   TRIG 153.0 (H) 08/24/2023   HDL 57.90 08/24/2023   LDLDIRECT 137.0 02/02/2022   LDLCALC 131 (H) 08/24/2023   ALT 27 08/24/2023   AST 25 08/24/2023   NA 141 09/02/2023   K 4.1 09/02/2023   CL 102 09/02/2023   CREATININE 1.81 (H) 09/02/2023   BUN 31 (H) 09/02/2023   CO2 33 (H) 09/02/2023   TSH 1.09  06/25/2023   INR 1.0 07/27/2021   HGBA1C 6.4 08/24/2023   Assessment/Plan:  [name] is a 88 y.o. Black or African American [2] female with  has a past medical history of Allergic rhinitis (10/30/2016), Anemia, Asthma, Breast cancer of upper-outer quadrant of left female breast (HCC) (11/08/2013), Chronic renal insufficiency, Chronic rhinitis, Colon polyp, Diastolic dysfunction (07/10/2016), DJD (degenerative joint disease), Dyspnea, Frozen shoulder, Full dentures, GERD (gastroesophageal reflux disease), Hearing loss, Hypertension, Hyponatremia, Impaired glucose tolerance (07/18/2014), Memory loss, Morbid obesity (HCC), Poor circulation, Vertigo, and Wears glasses.  Chronic diastolic CHF (congestive heart failure) (HCC) Stable volume, cont current med tx  Wheezing Mild to mod, for repeat prednisone taper,,  to f/u any worsening symptoms or concerns  Diarrhea Without pain, fever - for prn lomotil  Hematochezia Small volume, for labs including cbc, declines GI referral, ok for anusol hc cr asd prn  Followup: Return if symptoms worsen or fail to improve.  Oliver Barre, MD 02/01/2024 7:23 PM  [facility]

## 2024-02-01 NOTE — Assessment & Plan Note (Signed)
 Small volume, for labs including cbc, declines GI referral, ok for anusol hc cr asd prn

## 2024-02-01 NOTE — Telephone Encounter (Signed)
 Chief Complaint: rectal bleeding Symptoms: rectal bleeding Frequency: daughter noticed it this AM, hx of hemorrhoids Pertinent Negatives: Patient denies fever, large amount of blood loss, vomiting, rectal pain, pallor Disposition: [] ED /[] Urgent Care (no appt availability in office) / [x] Appointment(In office/virtual)/ []  [facility]/ [] Home Care/ [] Refused Recommended Disposition /[] [facility]/ []  Follow-up with PCP  Additional Notes: Daughter [name] calls in for pt. Daughter states she noticed blood in the pt's brief today when she showered her. Daughter states "there was a line of blood." Daughter states it is probable the pt is having bloody stools, but the daughter does not know. Pt denies that she has had bleeding or a bowel movement that has turned the toilet water pink. Pt has a hx of hemorrhoids. Daughter states Dr. Jonny Ruiz prescribed the pt a cream years ago and she would like it refilled. Pt denies rectal pain.  Daughter states pt has intermittent lightheadedness but that is not new. Daughter states pt appears weaker today, but also states the pt has had other medical issues lately and feels they may be making her tired. RN scheduled pt for 4/8 at 1600 today. RN advised daughter that if the pt develops worsening lightheadedness or dizziness, pallor, loses a large amount of blood, or any worsening she needs to go to the ED. Daughter verbalized understanding.      Copied from CRM [id number]. Topic: Clinical - Red Word Triage >> Feb 01, 2024 11:33 AM Deaijah H wrote: Red Word that prompted transfer to Nurse Triage: Bleeding from anal medium flow. Reason for Disposition  MILD rectal bleeding (more than just a few drops or streaks)  Answer Assessment - Initial Assessment Questions 1. APPEARANCE of BLOOD: "What color is it?" "Is it passed separately, on the surface of the stool, or mixed in with the stool?"      Daughter noticed blood in her brief and on the transfer  bench. "Probably" has blood mixed in with stool. "There was a line in the brief" 2. AMOUNT: "How much blood was passed?"      Line in the brief  3. FREQUENCY: "How many times has blood been passed with the stools?"      Unsure  4. ONSET: "When was the blood first seen in the stools?" (Days or weeks)      "Couple of days", daughter showers her 3x/wk. Daughter states bleeding may have started before 3 days ago, that is just when she noticed blood in her brief. Daughter did not notice blood on Friday when she showered her. Sunday pt noticed incontinence. Pt did not want shower on Sunday, daughter gave her the shower today. Daughter states she is "weakish."  5. DIARRHEA: "Is there also some diarrhea?" If Yes, ask: "How many diarrhea stools in the past 24 hours?"      Takes Immodium "to firm it up", still soft 6. CONSTIPATION: "Do you have constipation?" If Yes, ask: "How bad is it?"     No 7. RECURRENT SYMPTOMS: "Have you had blood in your stools before?" If Yes, ask: "When was the last time?" and "What happened that time?"      Daughter states she has likely had this before  8. BLOOD THINNERS: "Do you take any blood thinners?" (e.g., Coumadin/warfarin, Pradaxa/dabigatran, aspirin)     Aspirin and celecoxib 9. OTHER SYMPTOMS: "Do you have any other symptoms?"  (e.g., abdomen pain, vomiting, dizziness, fever)     Denies abdominal pain. Hx of hemorrhoids. Denies rectal pain. Denies nausea and  vomiting. Endorses being "lightheaded sometimes", home person comes between 1300-1400. States lightheadedness is more frequent but this is not new. Denies fever. Daughter denies pallor. Patient sits back down on the bed and will walk once ready. Daughter endorses pt had an asthma attack while waiting for an inhaler. Daughter denies that pt has SOB/difficulty breathing right now. Denies CP.  Daughter rubbed her down and used Icyhot for muscle cramps. Ate breakfast as normal today  Pt had to rescheduled a [place]  kidney appt, was supposed to be seen Feb 20, 2024 but dr had a death in the family. Incontinence yesterday  Protocols used: Rectal Bleeding-A-AH

## 2024-02-01 NOTE — Assessment & Plan Note (Signed)
Stable volume, cont current med tx 

## 2024-02-01 NOTE — Patient Instructions (Signed)
 Please take all new medication as prescribed- the cream for the tender bleeding area, the lomotil as needed for the diarrhea, and prednisone  Please continue all other medications as before, and refills have been done if requested.  Please have the pharmacy call with any other refills you may need.  Please keep your appointments with your specialists as you may have planned  Please go to the LAB at the blood drawing area for the tests to be done  You will be contacted by phone if any changes need to be made immediately.  Otherwise, you will receive a letter about your results with an explanation, but please check with MyChart first.

## 2024-02-02 ENCOUNTER — Encounter: Payer: Self-pay | Admitting: Internal Medicine

## 2024-02-02 LAB — CBC WITH DIFFERENTIAL/PLATELET
Basophils Absolute: 0 10*3/uL (ref 0.0–0.1)
Basophils Relative: 0.5 % (ref 0.0–3.0)
Eosinophils Absolute: 0.1 10*3/uL (ref 0.0–0.7)
Eosinophils Relative: 1.4 % (ref 0.0–5.0)
HCT: 36.1 % (ref 36.0–46.0)
Hemoglobin: 11.8 g/dL — ABNORMAL LOW (ref 12.0–15.0)
Lymphocytes Relative: 11 % — ABNORMAL LOW (ref 12.0–46.0)
Lymphs Abs: 1 10*3/uL (ref 0.7–4.0)
MCHC: 32.7 g/dL (ref 30.0–36.0)
MCV: 105.7 fl — ABNORMAL HIGH (ref 78.0–100.0)
Monocytes Absolute: 0.6 10*3/uL (ref 0.1–1.0)
Monocytes Relative: 6.5 % (ref 3.0–12.0)
Neutro Abs: 7.3 10*3/uL (ref 1.4–7.7)
Neutrophils Relative %: 80.6 % — ABNORMAL HIGH (ref 43.0–77.0)
Platelets: 206 10*3/uL (ref 150.0–400.0)
RBC: 3.42 Mil/uL — ABNORMAL LOW (ref 3.87–5.11)
RDW: 13.8 % (ref 11.5–15.5)
WBC: 9 10*3/uL (ref 4.0–10.5)

## 2024-02-02 LAB — BASIC METABOLIC PANEL WITH GFR
BUN: 53 mg/dL — ABNORMAL HIGH (ref 6–23)
CO2: 34 meq/L — ABNORMAL HIGH (ref 19–32)
Calcium: 9.2 mg/dL (ref 8.4–10.5)
Chloride: 94 meq/L — ABNORMAL LOW (ref 96–112)
Creatinine, Ser: 2.47 mg/dL — ABNORMAL HIGH (ref 0.40–1.20)
GFR: 17.27 mL/min — ABNORMAL LOW (ref >60.00)
Glucose, Bld: 145 mg/dL — ABNORMAL HIGH (ref 70–99)
Potassium: 4.4 meq/L (ref 3.5–5.1)
Sodium: 137 meq/L (ref 135–145)

## 2024-02-02 LAB — HEPATIC FUNCTION PANEL
ALT: 24 U/L (ref 0–35)
AST: 21 U/L (ref 0–37)
Albumin: 4 g/dL (ref 3.5–5.2)
Alkaline Phosphatase: 54 U/L (ref 39–117)
Bilirubin, Direct: 0.1 mg/dL (ref 0.0–0.3)
Total Bilirubin: 0.5 mg/dL (ref 0.2–1.2)
Total Protein: 6.6 g/dL (ref 6.0–8.3)

## 2024-02-02 LAB — IBC PANEL
Iron: 97 ug/dL (ref 42–145)
Saturation Ratios: 28.4 % (ref 20.0–50.0)
TIBC: 341.6 ug/dL (ref 250.0–450.0)
Transferrin: 244 mg/dL (ref 212.0–360.0)

## 2024-02-02 LAB — FERRITIN: Ferritin: 69.5 ng/mL (ref 10.0–291.0)

## 2024-02-03 DIAGNOSIS — M25552 Pain in left hip: Secondary | ICD-10-CM | POA: Diagnosis not present

## 2024-02-03 DIAGNOSIS — R7303 Prediabetes: Secondary | ICD-10-CM | POA: Diagnosis not present

## 2024-02-03 DIAGNOSIS — K219 Gastro-esophageal reflux disease without esophagitis: Secondary | ICD-10-CM | POA: Diagnosis not present

## 2024-02-03 DIAGNOSIS — E559 Vitamin D deficiency, unspecified: Secondary | ICD-10-CM | POA: Diagnosis not present

## 2024-02-03 DIAGNOSIS — Z96653 Presence of artificial knee joint, bilateral: Secondary | ICD-10-CM | POA: Diagnosis not present

## 2024-02-03 DIAGNOSIS — J45909 Unspecified asthma, uncomplicated: Secondary | ICD-10-CM | POA: Diagnosis not present

## 2024-02-03 DIAGNOSIS — D649 Anemia, unspecified: Secondary | ICD-10-CM | POA: Diagnosis not present

## 2024-02-03 DIAGNOSIS — M7501 Adhesive capsulitis of right shoulder: Secondary | ICD-10-CM | POA: Diagnosis not present

## 2024-02-03 DIAGNOSIS — N184 Chronic kidney disease, stage 4 (severe): Secondary | ICD-10-CM | POA: Diagnosis not present

## 2024-02-03 DIAGNOSIS — M25551 Pain in right hip: Secondary | ICD-10-CM | POA: Diagnosis not present

## 2024-02-03 DIAGNOSIS — I129 Hypertensive chronic kidney disease with stage 1 through stage 4 chronic kidney disease, or unspecified chronic kidney disease: Secondary | ICD-10-CM | POA: Diagnosis not present

## 2024-02-03 DIAGNOSIS — M1990 Unspecified osteoarthritis, unspecified site: Secondary | ICD-10-CM | POA: Diagnosis not present

## 2024-02-03 DIAGNOSIS — M7502 Adhesive capsulitis of left shoulder: Secondary | ICD-10-CM | POA: Diagnosis not present

## 2024-02-03 DIAGNOSIS — Z90710 Acquired absence of both cervix and uterus: Secondary | ICD-10-CM | POA: Diagnosis not present

## 2024-02-03 DIAGNOSIS — M353 Polymyalgia rheumatica: Secondary | ICD-10-CM | POA: Diagnosis not present

## 2024-02-03 DIAGNOSIS — Z853 Personal history of malignant neoplasm of breast: Secondary | ICD-10-CM | POA: Diagnosis not present

## 2024-02-03 DIAGNOSIS — Z981 Arthrodesis status: Secondary | ICD-10-CM | POA: Diagnosis not present

## 2024-02-07 DIAGNOSIS — Z981 Arthrodesis status: Secondary | ICD-10-CM | POA: Diagnosis not present

## 2024-02-07 DIAGNOSIS — M7502 Adhesive capsulitis of left shoulder: Secondary | ICD-10-CM | POA: Diagnosis not present

## 2024-02-07 DIAGNOSIS — R7303 Prediabetes: Secondary | ICD-10-CM | POA: Diagnosis not present

## 2024-02-07 DIAGNOSIS — K219 Gastro-esophageal reflux disease without esophagitis: Secondary | ICD-10-CM | POA: Diagnosis not present

## 2024-02-07 DIAGNOSIS — M25552 Pain in left hip: Secondary | ICD-10-CM | POA: Diagnosis not present

## 2024-02-07 DIAGNOSIS — M353 Polymyalgia rheumatica: Secondary | ICD-10-CM | POA: Diagnosis not present

## 2024-02-07 DIAGNOSIS — Z90710 Acquired absence of both cervix and uterus: Secondary | ICD-10-CM | POA: Diagnosis not present

## 2024-02-07 DIAGNOSIS — Z96653 Presence of artificial knee joint, bilateral: Secondary | ICD-10-CM | POA: Diagnosis not present

## 2024-02-07 DIAGNOSIS — M1990 Unspecified osteoarthritis, unspecified site: Secondary | ICD-10-CM | POA: Diagnosis not present

## 2024-02-07 DIAGNOSIS — Z853 Personal history of malignant neoplasm of breast: Secondary | ICD-10-CM | POA: Diagnosis not present

## 2024-02-07 DIAGNOSIS — I129 Hypertensive chronic kidney disease with stage 1 through stage 4 chronic kidney disease, or unspecified chronic kidney disease: Secondary | ICD-10-CM | POA: Diagnosis not present

## 2024-02-07 DIAGNOSIS — E559 Vitamin D deficiency, unspecified: Secondary | ICD-10-CM | POA: Diagnosis not present

## 2024-02-07 DIAGNOSIS — M7501 Adhesive capsulitis of right shoulder: Secondary | ICD-10-CM | POA: Diagnosis not present

## 2024-02-07 DIAGNOSIS — D649 Anemia, unspecified: Secondary | ICD-10-CM | POA: Diagnosis not present

## 2024-02-07 DIAGNOSIS — J45909 Unspecified asthma, uncomplicated: Secondary | ICD-10-CM | POA: Diagnosis not present

## 2024-02-07 DIAGNOSIS — M25551 Pain in right hip: Secondary | ICD-10-CM | POA: Diagnosis not present

## 2024-02-07 DIAGNOSIS — N184 Chronic kidney disease, stage 4 (severe): Secondary | ICD-10-CM | POA: Diagnosis not present

## 2024-02-08 ENCOUNTER — Other Ambulatory Visit: Payer: Self-pay

## 2024-02-08 ENCOUNTER — Other Ambulatory Visit: Payer: Self-pay | Admitting: Internal Medicine

## 2024-02-08 DIAGNOSIS — F4325 Adjustment disorder with mixed disturbance of emotions and conduct: Secondary | ICD-10-CM | POA: Diagnosis not present

## 2024-02-10 ENCOUNTER — Other Ambulatory Visit: Payer: Self-pay

## 2024-02-10 MED ORDER — PREDNISONE 5 MG PO TABS: 5.0000 mg | ORAL_TABLET | Freq: Every day | ORAL | 3 refills | Status: DC

## 2024-02-11 ENCOUNTER — Ambulatory Visit: Payer: Self-pay

## 2024-02-11 DIAGNOSIS — E559 Vitamin D deficiency, unspecified: Secondary | ICD-10-CM | POA: Diagnosis not present

## 2024-02-11 DIAGNOSIS — M25551 Pain in right hip: Secondary | ICD-10-CM | POA: Diagnosis not present

## 2024-02-11 DIAGNOSIS — R7303 Prediabetes: Secondary | ICD-10-CM | POA: Diagnosis not present

## 2024-02-11 DIAGNOSIS — K219 Gastro-esophageal reflux disease without esophagitis: Secondary | ICD-10-CM | POA: Diagnosis not present

## 2024-02-11 DIAGNOSIS — M25552 Pain in left hip: Secondary | ICD-10-CM | POA: Diagnosis not present

## 2024-02-11 DIAGNOSIS — D649 Anemia, unspecified: Secondary | ICD-10-CM | POA: Diagnosis not present

## 2024-02-11 DIAGNOSIS — M7502 Adhesive capsulitis of left shoulder: Secondary | ICD-10-CM | POA: Diagnosis not present

## 2024-02-11 DIAGNOSIS — Z981 Arthrodesis status: Secondary | ICD-10-CM | POA: Diagnosis not present

## 2024-02-11 DIAGNOSIS — Z96653 Presence of artificial knee joint, bilateral: Secondary | ICD-10-CM | POA: Diagnosis not present

## 2024-02-11 DIAGNOSIS — M1990 Unspecified osteoarthritis, unspecified site: Secondary | ICD-10-CM | POA: Diagnosis not present

## 2024-02-11 DIAGNOSIS — N184 Chronic kidney disease, stage 4 (severe): Secondary | ICD-10-CM | POA: Diagnosis not present

## 2024-02-11 DIAGNOSIS — M7501 Adhesive capsulitis of right shoulder: Secondary | ICD-10-CM | POA: Diagnosis not present

## 2024-02-11 DIAGNOSIS — Z90710 Acquired absence of both cervix and uterus: Secondary | ICD-10-CM | POA: Diagnosis not present

## 2024-02-11 DIAGNOSIS — Z853 Personal history of malignant neoplasm of breast: Secondary | ICD-10-CM | POA: Diagnosis not present

## 2024-02-11 DIAGNOSIS — M353 Polymyalgia rheumatica: Secondary | ICD-10-CM | POA: Diagnosis not present

## 2024-02-11 DIAGNOSIS — J45909 Unspecified asthma, uncomplicated: Secondary | ICD-10-CM | POA: Diagnosis not present

## 2024-02-11 DIAGNOSIS — I129 Hypertensive chronic kidney disease with stage 1 through stage 4 chronic kidney disease, or unspecified chronic kidney disease: Secondary | ICD-10-CM | POA: Diagnosis not present

## 2024-02-11 NOTE — Telephone Encounter (Signed)
 This RN made the first attempt to contact the patient. Patient did not answer, RN LVM and a callback number.    Copied From CRM [id number]. Reason for Triage: diarrhea Patient is calling because she thinks the diphenoxylate -atropine  (LOMOTIL ) 2.5-0.025 MG tablet is giving her diarrhea

## 2024-02-11 NOTE — Telephone Encounter (Addendum)
 Second attempt; no answer.  Third attempt; no answer; routed to pcp

## 2024-02-13 ENCOUNTER — Emergency Department (HOSPITAL_COMMUNITY)

## 2024-02-13 ENCOUNTER — Other Ambulatory Visit: Payer: Self-pay

## 2024-02-13 ENCOUNTER — Ambulatory Visit (HOSPITAL_COMMUNITY)
Admission: EM | Admit: 2024-02-13 | Discharge: 2024-02-13 | Disposition: A | Discharge status: Home / Self Care | Attending: Family Medicine | Admitting: Family Medicine

## 2024-02-13 ENCOUNTER — Observation Stay (HOSPITAL_COMMUNITY)
Admission: EM | Admit: 2024-02-13 | Discharge: 2024-02-15 | Disposition: A | Discharge status: Home / Self Care | Attending: Internal Medicine | Admitting: Internal Medicine

## 2024-02-13 ENCOUNTER — Encounter (HOSPITAL_COMMUNITY): Payer: Self-pay

## 2024-02-13 ENCOUNTER — Observation Stay (HOSPITAL_COMMUNITY)

## 2024-02-13 ENCOUNTER — Encounter (HOSPITAL_COMMUNITY): Payer: Self-pay | Admitting: *Deleted

## 2024-02-13 DIAGNOSIS — R109 Unspecified abdominal pain: Secondary | ICD-10-CM | POA: Diagnosis not present

## 2024-02-13 DIAGNOSIS — E860 Dehydration: Secondary | ICD-10-CM | POA: Insufficient documentation

## 2024-02-13 DIAGNOSIS — K922 Gastrointestinal hemorrhage, unspecified: Principal | ICD-10-CM | POA: Insufficient documentation

## 2024-02-13 DIAGNOSIS — K5730 Diverticulosis of large intestine without perforation or abscess without bleeding: Secondary | ICD-10-CM | POA: Diagnosis not present

## 2024-02-13 DIAGNOSIS — K921 Melena: Secondary | ICD-10-CM | POA: Diagnosis not present

## 2024-02-13 DIAGNOSIS — R062 Wheezing: Secondary | ICD-10-CM | POA: Diagnosis not present

## 2024-02-13 DIAGNOSIS — K219 Gastro-esophageal reflux disease without esophagitis: Secondary | ICD-10-CM | POA: Diagnosis not present

## 2024-02-13 DIAGNOSIS — R319 Hematuria, unspecified: Secondary | ICD-10-CM | POA: Insufficient documentation

## 2024-02-13 DIAGNOSIS — K449 Diaphragmatic hernia without obstruction or gangrene: Secondary | ICD-10-CM | POA: Diagnosis not present

## 2024-02-13 DIAGNOSIS — D649 Anemia, unspecified: Secondary | ICD-10-CM | POA: Insufficient documentation

## 2024-02-13 DIAGNOSIS — R531 Weakness: Secondary | ICD-10-CM | POA: Diagnosis not present

## 2024-02-13 DIAGNOSIS — N184 Chronic kidney disease, stage 4 (severe): Secondary | ICD-10-CM | POA: Insufficient documentation

## 2024-02-13 DIAGNOSIS — I10 Essential (primary) hypertension: Secondary | ICD-10-CM | POA: Diagnosis present

## 2024-02-13 DIAGNOSIS — Z7901 Long term (current) use of anticoagulants: Secondary | ICD-10-CM | POA: Insufficient documentation

## 2024-02-13 DIAGNOSIS — R4182 Altered mental status, unspecified: Secondary | ICD-10-CM | POA: Insufficient documentation

## 2024-02-13 DIAGNOSIS — Z79899 Other long term (current) drug therapy: Secondary | ICD-10-CM | POA: Insufficient documentation

## 2024-02-13 DIAGNOSIS — Z9889 Other specified postprocedural states: Secondary | ICD-10-CM

## 2024-02-13 DIAGNOSIS — I6782 Cerebral ischemia: Secondary | ICD-10-CM | POA: Diagnosis not present

## 2024-02-13 DIAGNOSIS — M961 Postlaminectomy syndrome, not elsewhere classified: Secondary | ICD-10-CM | POA: Diagnosis not present

## 2024-02-13 DIAGNOSIS — I130 Hypertensive heart and chronic kidney disease with heart failure and stage 1 through stage 4 chronic kidney disease, or unspecified chronic kidney disease: Secondary | ICD-10-CM | POA: Diagnosis not present

## 2024-02-13 DIAGNOSIS — Z6830 Body mass index (BMI) 30.0-30.9, adult: Secondary | ICD-10-CM | POA: Insufficient documentation

## 2024-02-13 DIAGNOSIS — R0989 Other specified symptoms and signs involving the circulatory and respiratory systems: Secondary | ICD-10-CM | POA: Diagnosis not present

## 2024-02-13 DIAGNOSIS — E66811 Obesity, class 1: Secondary | ICD-10-CM | POA: Insufficient documentation

## 2024-02-13 DIAGNOSIS — I672 Cerebral atherosclerosis: Secondary | ICD-10-CM | POA: Diagnosis not present

## 2024-02-13 DIAGNOSIS — R197 Diarrhea, unspecified: Secondary | ICD-10-CM | POA: Diagnosis not present

## 2024-02-13 DIAGNOSIS — K625 Hemorrhage of anus and rectum: Secondary | ICD-10-CM | POA: Insufficient documentation

## 2024-02-13 DIAGNOSIS — R310 Gross hematuria: Secondary | ICD-10-CM | POA: Diagnosis not present

## 2024-02-13 DIAGNOSIS — I5032 Chronic diastolic (congestive) heart failure: Secondary | ICD-10-CM | POA: Diagnosis not present

## 2024-02-13 DIAGNOSIS — K559 Vascular disorder of intestine, unspecified: Secondary | ICD-10-CM | POA: Diagnosis not present

## 2024-02-13 LAB — CBC WITH DIFFERENTIAL/PLATELET
Abs Immature Granulocytes: 0.11 10*3/uL — ABNORMAL HIGH (ref 0.00–0.07)
Basophils Absolute: 0 10*3/uL (ref 0.0–0.1)
Basophils Relative: 0 %
Eosinophils Absolute: 0.3 10*3/uL (ref 0.0–0.5)
Eosinophils Relative: 4 %
HCT: 33.3 % — ABNORMAL LOW (ref 36.0–46.0)
Hemoglobin: 10.5 g/dL — ABNORMAL LOW (ref 12.0–15.0)
Immature Granulocytes: 1 %
Lymphocytes Relative: 14 %
Lymphs Abs: 1.1 10*3/uL (ref 0.7–4.0)
MCH: 33.8 pg (ref 26.0–34.0)
MCHC: 31.5 g/dL (ref 30.0–36.0)
MCV: 107.1 fL — ABNORMAL HIGH (ref 80.0–100.0)
Monocytes Absolute: 0.9 10*3/uL (ref 0.1–1.0)
Monocytes Relative: 12 %
Neutro Abs: 5.2 10*3/uL (ref 1.7–7.7)
Neutrophils Relative %: 69 %
Platelets: 209 10*3/uL (ref 150–400)
RBC: 3.11 MIL/uL — ABNORMAL LOW (ref 3.87–5.11)
RDW: 12.8 % (ref 11.5–15.5)
WBC: 7.6 10*3/uL (ref 4.0–10.5)
nRBC: 0 % (ref 0.0–0.2)

## 2024-02-13 LAB — COMPREHENSIVE METABOLIC PANEL WITH GFR
ALT: 20 U/L (ref 0–44)
AST: 23 U/L (ref 15–41)
Albumin: 3.2 g/dL — ABNORMAL LOW (ref 3.5–5.0)
Alkaline Phosphatase: 48 U/L (ref 38–126)
Anion gap: 10 (ref 5–15)
BUN: 67 mg/dL — ABNORMAL HIGH (ref 8–23)
CO2: 33 mmol/L — ABNORMAL HIGH (ref 22–32)
Calcium: 8.9 mg/dL (ref 8.9–10.3)
Chloride: 90 mmol/L — ABNORMAL LOW (ref 98–111)
Creatinine, Ser: 2.84 mg/dL — ABNORMAL HIGH (ref 0.44–1.00)
GFR, Estimated: 16 mL/min — ABNORMAL LOW (ref >60)
Glucose, Bld: 115 mg/dL — ABNORMAL HIGH (ref 70–99)
Potassium: 4.3 mmol/L (ref 3.5–5.1)
Sodium: 133 mmol/L — ABNORMAL LOW (ref 135–145)
Total Bilirubin: 0.5 mg/dL (ref 0.0–1.2)
Total Protein: 6.2 g/dL — ABNORMAL LOW (ref 6.5–8.1)

## 2024-02-13 LAB — IRON AND TIBC
Iron: 54 ug/dL (ref 28–170)
Saturation Ratios: 16 % (ref 10.4–31.8)
TIBC: 347 ug/dL (ref 250–450)
UIBC: 293 ug/dL

## 2024-02-13 LAB — CREATININE, SERUM
Creatinine, Ser: 2.61 mg/dL — ABNORMAL HIGH (ref 0.44–1.00)
GFR, Estimated: 17 mL/min — ABNORMAL LOW (ref >60)

## 2024-02-13 LAB — POCT URINALYSIS DIP (MANUAL ENTRY)
Bilirubin, UA: NEGATIVE
Glucose, UA: 500 mg/dL — AB
Ketones, POC UA: NEGATIVE mg/dL
Nitrite, UA: NEGATIVE
Protein Ur, POC: NEGATIVE mg/dL
Spec Grav, UA: 1.01 (ref 1.010–1.025)
Urobilinogen, UA: 0.2 U/dL
pH, UA: 7 (ref 5.0–8.0)

## 2024-02-13 LAB — CBC
HCT: 34 % — ABNORMAL LOW (ref 36.0–46.0)
Hemoglobin: 10.8 g/dL — ABNORMAL LOW (ref 12.0–15.0)
MCH: 33.2 pg (ref 26.0–34.0)
MCHC: 31.8 g/dL (ref 30.0–36.0)
MCV: 104.6 fL — ABNORMAL HIGH (ref 80.0–100.0)
Platelets: 221 10*3/uL (ref 150–400)
RBC: 3.25 MIL/uL — ABNORMAL LOW (ref 3.87–5.11)
RDW: 12.6 % (ref 11.5–15.5)
WBC: 6.4 10*3/uL (ref 4.0–10.5)
nRBC: 0 % (ref 0.0–0.2)

## 2024-02-13 LAB — FERRITIN: Ferritin: 54 ng/mL (ref 11–307)

## 2024-02-13 LAB — BRAIN NATRIURETIC PEPTIDE: B Natriuretic Peptide: 100.5 pg/mL — ABNORMAL HIGH (ref 0.0–100.0)

## 2024-02-13 LAB — POC OCCULT BLOOD, ED: Fecal Occult Bld: POSITIVE — AB

## 2024-02-13 LAB — VITAMIN B12: Vitamin B-12: 6052 pg/mL — ABNORMAL HIGH (ref 180–914)

## 2024-02-13 LAB — I-STAT CG4 LACTIC ACID, ED: Lactic Acid, Venous: 1.2 mmol/L (ref 0.5–1.9)

## 2024-02-13 MED ORDER — FLUTICASONE FUROATE-VILANTEROL 200-25 MCG/ACT IN AEPB
1.0000 | INHALATION_SPRAY | Freq: Every day | RESPIRATORY_TRACT | Status: DC
  Administered 2024-02-15: 1 via RESPIRATORY_TRACT
  Filled 2024-02-13 (×2): qty 28

## 2024-02-13 MED ORDER — PANTOPRAZOLE SODIUM 40 MG PO TBEC
40.0000 mg | DELAYED_RELEASE_TABLET | Freq: Every day | ORAL | Status: DC
Start: 2024-02-14 — End: 2024-02-15
  Administered 2024-02-14 – 2024-02-15 (×2): 40 mg via ORAL
  Filled 2024-02-13 (×2): qty 1

## 2024-02-13 MED ORDER — HYDROCORTISONE ACETATE 25 MG RE SUPP
25.0000 mg | Freq: Two times a day (BID) | RECTAL | Status: DC
  Administered 2024-02-14 (×2): 25 mg via RECTAL
  Filled 2024-02-13 (×5): qty 1

## 2024-02-13 MED ORDER — METOPROLOL SUCCINATE ER 25 MG PO TB24
25.0000 mg | ORAL_TABLET | Freq: Every day | ORAL | Status: DC
  Administered 2024-02-15: 25 mg via ORAL
  Filled 2024-02-13 (×2): qty 1

## 2024-02-13 MED ORDER — ONDANSETRON HCL 4 MG/2ML IJ SOLN: 4.0000 mg | Freq: Four times a day (QID) | INTRAMUSCULAR | Status: DC | PRN

## 2024-02-13 MED ORDER — ONDANSETRON HCL 4 MG PO TABS: 4.0000 mg | ORAL_TABLET | Freq: Four times a day (QID) | ORAL | Status: DC | PRN

## 2024-02-13 MED ORDER — ALLOPURINOL 100 MG PO TABS
100.0000 mg | ORAL_TABLET | Freq: Every day | ORAL | Status: DC
  Administered 2024-02-13 – 2024-02-15 (×3): 100 mg via ORAL
  Filled 2024-02-13 (×3): qty 1

## 2024-02-13 MED ORDER — OXYCODONE HCL 5 MG PO TABS
5.0000 mg | ORAL_TABLET | Freq: Once | ORAL | Status: AC
  Administered 2024-02-13: 5 mg via ORAL
  Filled 2024-02-13: qty 1

## 2024-02-13 MED ORDER — PREDNISONE 10 MG PO TABS
5.0000 mg | ORAL_TABLET | Freq: Every day | ORAL | Status: DC
  Administered 2024-02-14 – 2024-02-15 (×2): 5 mg via ORAL
  Filled 2024-02-13 (×2): qty 1

## 2024-02-13 MED ORDER — PANTOPRAZOLE SODIUM 40 MG IV SOLR
40.0000 mg | Freq: Once | INTRAVENOUS | Status: AC
  Administered 2024-02-13: 40 mg via INTRAVENOUS
  Filled 2024-02-13: qty 10

## 2024-02-13 MED ORDER — LOSARTAN POTASSIUM 50 MG PO TABS
100.0000 mg | ORAL_TABLET | Freq: Every day | ORAL | Status: DC
  Administered 2024-02-15: 100 mg via ORAL
  Filled 2024-02-13 (×2): qty 2

## 2024-02-13 MED ORDER — ACETAMINOPHEN 325 MG PO TABS
650.0000 mg | ORAL_TABLET | Freq: Four times a day (QID) | ORAL | Status: DC | PRN
  Filled 2024-02-13: qty 2

## 2024-02-13 MED ORDER — GABAPENTIN 300 MG PO CAPS
300.0000 mg | ORAL_CAPSULE | Freq: Three times a day (TID) | ORAL | Status: DC
  Administered 2024-02-13 – 2024-02-15 (×6): 300 mg via ORAL
  Filled 2024-02-13 (×6): qty 1

## 2024-02-13 MED ORDER — DEXTROSE-SODIUM CHLORIDE 5-0.9 % IV SOLN: INTRAVENOUS | Status: DC

## 2024-02-13 MED ORDER — SODIUM CHLORIDE 0.9 % IV BOLUS
500.0000 mL | Freq: Once | INTRAVENOUS | Status: AC
  Administered 2024-02-13: 500 mL via INTRAVENOUS

## 2024-02-13 MED ORDER — ADULT MULTIVITAMIN W/MINERALS CH
1.0000 | ORAL_TABLET | Freq: Every day | ORAL | Status: DC
  Administered 2024-02-14 – 2024-02-15 (×2): 1 via ORAL
  Filled 2024-02-13 (×2): qty 1

## 2024-02-13 MED ORDER — ACETAMINOPHEN 650 MG RE SUPP: 650.0000 mg | Freq: Four times a day (QID) | RECTAL | Status: DC | PRN

## 2024-02-13 MED ORDER — FEBUXOSTAT 40 MG PO TABS: 40.0000 mg | ORAL_TABLET | Freq: Every day | ORAL | Status: DC

## 2024-02-13 MED ORDER — MONTELUKAST SODIUM 10 MG PO TABS
10.0000 mg | ORAL_TABLET | Freq: Every day | ORAL | Status: DC
  Administered 2024-02-13 – 2024-02-15 (×3): 10 mg via ORAL
  Filled 2024-02-13 (×3): qty 1

## 2024-02-13 MED ORDER — AMLODIPINE BESYLATE 5 MG PO TABS
5.0000 mg | ORAL_TABLET | Freq: Every morning | ORAL | Status: DC
  Administered 2024-02-15: 5 mg via ORAL
  Filled 2024-02-13 (×2): qty 1

## 2024-02-13 NOTE — ED Notes (Signed)
 Patient is being discharged from the Urgent Care and sent to the Emergency Department via POV . Per Dr. Lauralyn Pollack, patient is in need of higher level of care due to confusion, blood in urine. Patient is aware and verbalizes understanding of plan of care.  Vitals:   02/13/24 1033  BP: (!) 157/62  Pulse: (!) 52  Resp: (!) 22  Temp: 98.2 F (36.8 C)  SpO2: 100%

## 2024-02-13 NOTE — Assessment & Plan Note (Addendum)
 Continue blood pressure control with amlodipine , metoprolol  and losartan 

## 2024-02-13 NOTE — ED Triage Notes (Signed)
 Pt and her daughter states increased diarrhea with blood X 2 weeks. She states she stopped lomotil  on 2/17 since she felt it was making diarrhea worse.   Daughter states she is giving albuterol  neb treatments every 6 hours but gave one treatment on Friday non since.   Daughter has increased thirst, fatigue and sleep.   Patient then states she has been confused, daughter didn't work yesterday since she was worried about pt. Pt states she has blood in her urine X more than a week.

## 2024-02-13 NOTE — H&P (Signed)
 History and Physical    Patient: [name] ZOX:[id number] DOB: 10-22-1936 DOA: 02/13/2024 DOS: the patient was seen and examined on 02/13/2024 PCP: Roslyn Coombe, MD  Patient coming from: Home  Chief Complaint:  Chief Complaint  Patient presents with   Hematuria   HPI: [name] is a 88 y.o. female with medical history significant of hypertension, CKD, chronic anemia, GERD, and osteoarthritis who presentes with diarrhea and hematochezia.  Reports about 2 weeks of diarrhea, watery in consistency, persistent and worsening in nature, associated with decreased appetite and occasional colicky right lower quadrant abdominal pain. No nausea or vomiting. She was noticed frank hematochezia mixed with stools. Positive generalized weakness and easy fatigability. When her symptoms started she tried antidiarrheal agents with no improvement in her symptoms.  Last night her symptoms were particularly worse and her daughter decided to take her to Urgent care this morning. There she was found dehydrated and decision was made to transfer to the ED.   Patient lived with her daughter and takes daily prednisone  for arthritis. She uses a rolling walker for ambulation. Denies any recent use of non steroidal antiinflammatory agents.    Review of Systems: As mentioned in the history of present illness. All other systems reviewed and are negative. Past Medical History:  Diagnosis Date   Allergic rhinitis 10/30/2016   Anemia    Asthma    Breast cancer of upper-outer quadrant of left female breast (HCC) 11/08/2013   ER/PR+ Her2- Left IDC    Chronic renal insufficiency    Chronic rhinitis    Colon polyp    Diastolic dysfunction 07/10/2016   DJD (degenerative joint disease)    Dyspnea    Frozen shoulder    Full dentures    GERD (gastroesophageal reflux disease)    Hearing loss    Hypertension    Hyponatremia    Impaired glucose tolerance 07/18/2014   Memory loss    Morbid obesity  (HCC)    Poor circulation    Vertigo    Wears glasses    Past Surgical History:  Procedure Laterality Date   ABDOMINAL HYSTERECTOMY     BREAST LUMPECTOMY WITH NEEDLE LOCALIZATION AND AXILLARY SENTINEL LYMPH NODE BX Left 12/04/2013   Procedure: BREAST LUMPECTOMY WITH NEEDLE LOCALIZATION AND AXILLARY SENTINEL LYMPH NODE BX;  Surgeon: Thayne Fine, MD;  Location: [facility];  Service: General;  Laterality: Left;   CATARACT EXTRACTION  2009   rt   COLONOSCOPY     EYE SURGERY Bilateral    cataract surgery   KNEE ARTHROSCOPY     both   LUMBAR LAMINECTOMY/DECOMPRESSION MICRODISCECTOMY Left 12/23/2017   Procedure: Left Lumbar One-Two Laminectomy with microdiscectomy;  Surgeon: Isadora Mar, MD;  Location: [facility] OR;  Service: Neurosurgery;  Laterality: Left;  Left L1-2 Laminectomy with microdiscectomy   TONSILLECTOMY     TOTAL KNEE ARTHROPLASTY  2002   rt   TOTAL KNEE ARTHROPLASTY  2003   left   VESICOVAGINAL FISTULA CLOSURE W/ TAH  1980   Social History:  reports that she has never smoked. She has never been exposed to tobacco smoke. She has never used smokeless tobacco. She reports that she does not drink alcohol and does not use drugs.  Allergies  Allergen Reactions   Hydrocodone  Itching   Hydrocodone -Acetaminophen  Other (See Comments)   Lasix  [Furosemide ] Other (See Comments)    Dizziness.    Tizanidine  Other (See Comments)    Dizzy and fall   Tizanidine  Hcl  Other (See Comments)   Iron  Hives and Other (See Comments)     bad constipation     Family History  Problem Relation Age of Onset   Colon cancer Mother        in her 60's   Stomach cancer Mother    Lung cancer Brother        was a smoker   Heart attack Son 72   Healthy Daughter     Prior to Admission medications   Medication Sig Start Date End Date Taking? Authorizing Provider  albuterol  (PROVENTIL ) (2.5 MG/3ML) 0.083% nebulizer solution Take 3 mLs (2.5 mg total) by nebulization every 6 (six) hours  as needed for wheezing or shortness of breath. 01/12/24   Roslyn Coombe, MD  albuterol  (VENTOLIN  HFA) [id number] Base) MCG/ACT inhaler Inhale 1 puff into the lungs every 6 (six) hours as needed for wheezing or shortness of breath. 08/24/23   Roslyn Coombe, MD  allopurinol  (ZYLOPRIM ) 100 MG tablet Take 100 mg by mouth daily. 04/17/22   [provider]  amLODipine  (NORVASC ) 5 MG tablet TAKE 1 TABLET BY MOUTH EVERY DAY Patient taking differently: Take 5 mg by mouth every morning. 10/08/20   Roslyn Coombe, MD  aspirin  EC 81 MG tablet Take 81 mg by mouth every morning. Swallow whole.    [provider]  celecoxib  (CELEBREX ) 100 MG capsule Take 100 mg by mouth 2 (two) times daily. 03/06/23   [provider]  Cholecalciferol (VITAMIN D3) 20 MCG (800 UNIT) TABS Take 1 tablet by mouth daily. 08/08/21   Verlyn Goad, MD  dapagliflozin  propanediol (FARXIGA ) 10 MG TABS tablet Take 1 tablet (10 mg total) by mouth daily before breakfast. 05/17/23   Roslyn Coombe, MD  diphenoxylate -atropine  (LOMOTIL ) 2.5-0.025 MG tablet Take 1 tablet by mouth 4 (four) times daily as needed for diarrhea or loose stools. 02/01/24   Roslyn Coombe, MD  Ensure (ENSURE) Take 1 Can by mouth 3 (three) times daily between meals. Patient taking differently: Take 237 mLs by mouth See admin instructions. Drink one can (237 mls) by mouth once or twice daily 05/23/19   Roslyn Coombe, MD  febuxostat  (ULORIC ) 40 MG tablet Take 40 mg by mouth daily. 05/25/22   [provider]  FLUAD QUADRIVALENT 0.5 ML injection  06/30/22   [provider]  fluticasone  (FLONASE) 50 MCG/ACT nasal spray Place into both nostrils. 01/25/24   [provider]  fluticasone -salmeterol (WIXELA INHUB) 500-50 MCG/ACT AEPB Inhale 1 puff into the lungs in the morning and at bedtime. 06/25/23   Roslyn Coombe, MD  furosemide  (LASIX ) 40 MG tablet Take 1 tablet (40 mg total) by mouth every Monday, Wednesday, and Friday. 08/08/21   Verlyn Goad, MD  gabapentin  (NEURONTIN ) 300 MG capsule TAKE 1 CAPSULE BY MOUTH THREE TIMES A DAY 09/27/23   Roslyn Coombe, MD  hydrocortisone  (ANUSOL -HC) 2.5 % rectal cream Place 1 Application rectally 2 (two) times daily. 02/01/24   Roslyn Coombe, MD  Incontinence Supply Disposable (DEPEND UNDERWEAR SM/MED) MISC Use as directed four times per day 05/23/19   Roslyn Coombe, MD  lidocaine  (LIDODERM ) 5 % Place 1 patch onto the skin daily. Remove & Discard patch within 12 hours or as directed by MD 12/07/22   Raulkar, Keven Pel, MD  losartan  (COZAAR ) 100 MG tablet Take 100 mg by mouth daily. 12/06/21   [provider]  Magnesium  Glycinate 120 MG CAPS Take 2 tablets by  mouth at bedtime. 01/07/24   Raulkar, Keven Pel, MD  meclizine  (ANTIVERT ) 12.5 MG tablet TAKE 1 TABLET BY MOUTH THREE TIMES A DAY AS NEEDED FOR DIZZINESS Patient taking differently: Take 12.5 mg by mouth 3 (three) times daily as needed for dizziness. 03/03/21   Roslyn Coombe, MD  metoprolol  succinate (TOPROL -XL) 25 MG 24 hr tablet TAKE 1 TABLET (25 MG TOTAL) BY MOUTH DAILY. 02/08/24   Roslyn Coombe, MD  montelukast  (SINGULAIR ) 10 MG tablet TAKE 1 TABLET BY MOUTH EVERY DAY 06/01/22   Roslyn Coombe, MD  Multiple Vitamin (MULTIVITAMIN WITH MINERALS) TABS tablet Take 1 tablet by mouth daily. Centrum Silver    [provider]  pantoprazole  (PROTONIX ) 40 MG tablet TAKE 1 TABLET BY MOUTH EVERY DAY 11/23/22   Roslyn Coombe, MD  potassium chloride  (KLOR-CON ) 10 MEQ tablet TAKE 1 TABLET BY MOUTH EVERY DAY WHEN TAKING FUROSEMIDE  Patient taking differently: Take 10 mEq by mouth every Monday, Wednesday, and Friday. 06/07/21   Roslyn Coombe, MD  predniSONE  (DELTASONE ) 10 MG tablet 3 tabs by mouth per day for 3 days,2tabs per day for 3 days,1tab per day for 3 days 01/12/24   Roslyn Coombe, MD  predniSONE  (DELTASONE ) 10 MG tablet 3 tabs by mouth per day for 3 days,2tabs per day for 3 days,1tab per day for 3 days 02/01/24   Roslyn Coombe, MD  predniSONE   (DELTASONE ) 5 MG tablet Take 1 tablet (5 mg total) by mouth daily with breakfast. 02/10/24   Raulkar, Keven Pel, MD  [facility] syringe  08/03/22   [provider]  triamcinolone  cream (KENALOG ) 0.1 % APPLY TO AFFECTED AREA TWICE A DAY 09/11/22   Roslyn Coombe, MD    Physical Exam: Vitals:   02/13/24 1120 02/13/24 1400 02/13/24 1558 02/13/24 1600  BP:  (!) 182/89  (!) 164/70  Pulse:  (!) 52  (!) 49  Resp:  13  19  Temp:   97.7 F (36.5 C)   TempSrc:   Oral   SpO2:  100%  100%  Weight: 72.2 kg     Height: 5\' 1"  (1.549 m)      Neurology awake and alert deconditioned ENT with mild pallor Cardiovascular with S1 and S2 present and regular with no gallops, rubs or murmurs No JVD Respiratory with no rales or wheezing, no rhonchi  Abdomen with no distention, non tender to deep palpation, no rebound or guarding, No lower extremity edema, compression socks in place    Data Reviewed:   Na 133. K 4.3 Cl 90, bicarbonate 33, glucose 115 bun 67 cr 2,84  BNP 100.5  Wbc 7.6 hgb 10.5 plt 209  Urine analysis SG 1,010 with negative nitrates, glucose 500 and large rbc.  Fecal occult stool positive   Chest radiograph with hypoinflation with no infiltrates or effusions. Mild cardiomegaly.   CT abdomen and pelvis with no acute findings, colonic diverticulosis, bilateral hyperdense renal cortical lesions, largest 3,8 cm left upper pole.   Assessment and Plan: * Acute lower GI bleeding Symptomatic anemia. Likely diverticular pain.   Old records personally reviewed, colonoscopy 2018 for similar symptoms of rectal bleeding and diarrhea, noted to have diverticulosis and internal hemorrhoids.  Plan for supportive medical therapy, follow H&H,  Hold PRBC transfusion for Hgb less than 7  Gentle hydration for today and clear liquid diet.  Continue pantoprazole  po.   Essential hypertension Continue blood pressure control with amlodipine , metoprolol  and losartan    CKD (chronic kidney disease)  stage 4, GFR 15-29 ml/min (HCC) AKI with base serum cr 2,3. Hyponatremia.   Likely due to hypovolemia.   Plan to hold on diuretic therapy for now and add gently IV fluids with isotonic saline at 75 ml per hr for 1 L.  Follow up renal function in am, avoid hypotension and nephrotoxic medications.   Chronic diastolic CHF (congestive heart failure) (HCC) No signs of acute exacerbation Plan to continue blood pressure control with losartan  and metoprolol  Hold on diuretic therapy  Because hematuria will hold on SGLT 2 inh for now.   Gastroesophageal reflux disease Continue with pantoprazole .  S/P lumbar laminectomy Continue gabapentin  and prednisone  She has not been on celecoxib  for many years.   Obesity, class 1 Calculated BMI 30,0      Advance Care Planning:   Code Status: Full Code   Consults: GI per ED   Family Communication: I spoke with patient's daughter at the bedside, we talked in detail about patient's condition, plan of care and prognosis and all questions were addressed.   Severity of Illness: The appropriate patient status for this patient is OBSERVATION. Observation status is judged to be reasonable and necessary in order to provide the required intensity of service to ensure the patient's safety. The patient's presenting symptoms, physical exam findings, and initial radiographic and laboratory data in the context of their medical condition is felt to place them at decreased risk for further clinical deterioration. Furthermore, it is anticipated that the patient will be medically stable for discharge from the hospital within 2 midnights of admission.   Author: Albertus Alt, MD 02/13/2024 4:17 PM  For on call review [email or web address].

## 2024-02-13 NOTE — ED Provider Triage Note (Signed)
 Emergency Medicine Provider Triage Evaluation Note  [name] , a 88 y.o. female  was evaluated in triage.  Pt complains of hematuria.  Patient's family is at bedside here with concerns that patient has been having blood in her stools as well as blood in her GERD.  This has been ongoing for last 4 weeks with blood in the stool that they report is in the toilet and she has had watery diarrhea.  She is not currently on any blood thinners as far as the family is aware but she does take aspirin .  Patient has also had some notable hematuria recently but denies any other urinary symptoms such as dysuria, urgency, or frequency.  Review of Systems  Positive: As above Negative: As above  Physical Exam  BP (!) 150/70   Pulse (!) 51   Temp 97.8 F (36.6 C)   Resp 16   Ht 5\' 1"  (1.549 m)   Wt 72.2 kg   SpO2 97%   BMI 30.08 kg/m  Gen:   Awake, fatigued, response to questioning but delayed Resp:  Normal effort  MSK:   Moves extremities without difficulty  Other:  No abdominal tenderness.  Medical Decision Making  Medically screening exam initiated at 11:37 AM.  Appropriate orders placed.  [name] was informed that the remainder of the evaluation will be completed by another provider, this initial triage assessment does not replace that evaluation, and the importance of remaining in the ED until their evaluation is complete.     Cormick Moss A, PA-C 02/13/24 1140

## 2024-02-13 NOTE — Assessment & Plan Note (Signed)
 AKI with base serum cr 2,3. Hyponatremia.   Likely due to hypovolemia.   Renal function with serum cr at 2,4 with K at 3,7 and serum bicarbonate at 32  Na 138  Continue to hold on loop diuretic.  Follow up renal function in am, avoid hypotension and nephrotoxic medications. Hold IV fluids and advance diet.

## 2024-02-13 NOTE — ED Notes (Signed)
 Patient transported to CT

## 2024-02-13 NOTE — ED Provider Notes (Signed)
  EMERGENCY DEPARTMENT AT [facility] Provider Note   CSN: [id number] Arrival date & time: 02/13/24  1110     History  Chief Complaint  Patient presents with   Hematuria    [name] is a 88 y.o. female with extensive medical history to include chronic diastolic CHF, long-term use of systemic steroids, chronic pain, CKD stage IV, hypertension, joint pain, bradycardia, fecal incontinence, hemorrhoids, hematochezia.  Patient presents to ED for evaluation of hematuria, blood in stool, weakness, shortness of breath.  Patient daughter here with the patient provides majority the history.  The patient daughter reports that the patient has a longstanding history of fecal incontinence.  Patient daughter reports that she has been seen by her PCP for this.  Patient daughter reports that he recently the fecal incontinence has worsened and in the last 1 week family has noticed that the patient is having blood in her stools.  Patient has a history of hemorrhoids but they state that the volume of blood is much different from her typical bloody stools related to hemorrhoids.  Patient denies any rectal pain.  They state that when the patient uses the bathroom she will have diarrhea as well as a large amount of blood that is noted in the toilet, on the toilet paper as well.  Patient reports that recently she has developed hematuria however denies any dysuria.  Denies any vaginal bleeding.  Reports that hematuria has began in the last 3 to 4 days.  Patient also complaining of generalized weakness that is been ongoing for the last 2 weeks.  Denies one-sided weakness or numbness, fevers, nausea vomiting at home.  She is endorsing abdominal pain that is located across her abdomen.  The patient was seen at urgent care today and referred to ED for further workup.  The patient daughter also reports that yesterday the patient developed confusion that lasted the entire day.  She reports at 1 point  she took her mother to the bathroom and then noticed that her mother was seemingly staring offand not responding to her.  Patient daughter reports that she feels as if this might be related to the fact that the patient's hearing aids are not turned on or charged however patient states that she felt "confused" all day yesterday.  Patient also complaining of shortness of breath.  Denies orthopnea.  Denies chest pain.  Reports history of CHF but denies any leg swelling, reports compliance on Lasix .   Hematuria Associated symptoms include abdominal pain and shortness of breath. Pertinent negatives include no chest pain.       Home Medications Prior to Admission medications   Medication Sig Start Date End Date Taking? Authorizing Provider  albuterol  (PROVENTIL ) (2.5 MG/3ML) 0.083% nebulizer solution Take 3 mLs (2.5 mg total) by nebulization every 6 (six) hours as needed for wheezing or shortness of breath. 01/12/24   Roslyn Coombe, MD  albuterol  (VENTOLIN  HFA) 108 [phone] Base) MCG/ACT inhaler Inhale 1 puff into the lungs every 6 (six) hours as needed for wheezing or shortness of breath. 08/24/23   Roslyn Coombe, MD  allopurinol  (ZYLOPRIM ) 100 MG tablet Take 100 mg by mouth daily. 04/17/22   [provider]  amLODipine  (NORVASC ) 5 MG tablet TAKE 1 TABLET BY MOUTH EVERY DAY Patient taking differently: Take 5 mg by mouth every morning. 10/08/20   Roslyn Coombe, MD  aspirin  EC 81 MG tablet Take 81 mg by mouth every morning. Swallow whole.    [provider]  celecoxib  (CELEBREX ) 100 MG capsule Take 100 mg by mouth 2 (two) times daily. 03/06/23   [provider]  Cholecalciferol (VITAMIN D3) 20 MCG (800 UNIT) TABS Take 1 tablet by mouth daily. 08/08/21   Verlyn Goad, MD  dapagliflozin  propanediol (FARXIGA ) 10 MG TABS tablet Take 1 tablet (10 mg total) by mouth daily before breakfast. 05/17/23   Roslyn Coombe, MD  diphenoxylate -atropine  (LOMOTIL ) 2.5-0.025 MG tablet Take 1 tablet by  mouth 4 (four) times daily as needed for diarrhea or loose stools. 02/01/24   Roslyn Coombe, MD  Ensure (ENSURE) Take 1 Can by mouth 3 (three) times daily between meals. Patient taking differently: Take 237 mLs by mouth See admin instructions. Drink one can (237 mls) by mouth once or twice daily 05/23/19   Roslyn Coombe, MD  febuxostat  (ULORIC ) 40 MG tablet Take 40 mg by mouth daily. 05/25/22   [provider]  FLUAD QUADRIVALENT 0.5 ML injection  06/30/22   [provider]  fluticasone  (FLONASE) 50 MCG/ACT nasal spray Place into both nostrils. 01/25/24   [provider]  fluticasone -salmeterol (WIXELA INHUB) 500-50 MCG/ACT AEPB Inhale 1 puff into the lungs in the morning and at bedtime. 06/25/23   Roslyn Coombe, MD  furosemide  (LASIX ) 40 MG tablet Take 1 tablet (40 mg total) by mouth every Monday, Wednesday, and Friday. 08/08/21   Verlyn Goad, MD  gabapentin  (NEURONTIN ) 300 MG capsule TAKE 1 CAPSULE BY MOUTH THREE TIMES A DAY 09/27/23   Roslyn Coombe, MD  hydrocortisone  (ANUSOL -HC) 2.5 % rectal cream Place 1 Application rectally 2 (two) times daily. 02/01/24   Roslyn Coombe, MD  Incontinence Supply Disposable (DEPEND UNDERWEAR SM/MED) MISC Use as directed four times per day 05/23/19   Roslyn Coombe, MD  lidocaine  (LIDODERM ) 5 % Place 1 patch onto the skin daily. Remove & Discard patch within 12 hours or as directed by MD 12/07/22   Raulkar, Keven Pel, MD  losartan  (COZAAR ) 100 MG tablet Take 100 mg by mouth daily. 12/06/21   [provider]  Magnesium  Glycinate 120 MG CAPS Take 2 tablets by mouth at bedtime. 01/07/24   Raulkar, Keven Pel, MD  meclizine  (ANTIVERT ) 12.5 MG tablet TAKE 1 TABLET BY MOUTH THREE TIMES A DAY AS NEEDED FOR DIZZINESS Patient taking differently: Take 12.5 mg by mouth 3 (three) times daily as needed for dizziness. 03/03/21   Roslyn Coombe, MD  metoprolol  succinate (TOPROL -XL) 25 MG 24 hr tablet TAKE 1 TABLET (25 MG TOTAL) BY MOUTH DAILY. 02/08/24   Roslyn Coombe, MD  montelukast  (SINGULAIR ) 10 MG tablet TAKE 1 TABLET BY MOUTH EVERY DAY 06/01/22   Roslyn Coombe, MD  Multiple Vitamin (MULTIVITAMIN WITH MINERALS) TABS tablet Take 1 tablet by mouth daily. Centrum Silver    [provider]  pantoprazole  (PROTONIX ) 40 MG tablet TAKE 1 TABLET BY MOUTH EVERY DAY 11/23/22   Roslyn Coombe, MD  potassium chloride  (KLOR-CON ) 10 MEQ tablet TAKE 1 TABLET BY MOUTH EVERY DAY WHEN TAKING FUROSEMIDE  Patient taking differently: Take 10 mEq by mouth every Monday, Wednesday, and Friday. 06/07/21   Roslyn Coombe, MD  predniSONE  (DELTASONE ) 10 MG tablet 3 tabs by mouth per day for 3 days,2tabs per day for 3 days,1tab per day for 3 days 01/12/24   Roslyn Coombe, MD  predniSONE  (DELTASONE ) 10 MG tablet 3 tabs by mouth per day for 3 days,2tabs per day for 3 days,1tab per day  for 3 days 02/01/24   Roslyn Coombe, MD  predniSONE  (DELTASONE ) 5 MG tablet Take 1 tablet (5 mg total) by mouth daily with breakfast. 02/10/24   Raulkar, Keven Pel, MD  [name] syringe  08/03/22   [provider]  triamcinolone  cream (KENALOG ) 0.1 % APPLY TO AFFECTED AREA TWICE A DAY 09/11/22   Roslyn Coombe, MD      Allergies    Hydrocodone , Hydrocodone -acetaminophen , Lasix  [furosemide ], Tizanidine , Tizanidine  hcl, and Iron     Review of Systems   Review of Systems  Constitutional:  Negative for fever.  Respiratory:  Positive for shortness of breath.   Cardiovascular:  Negative for chest pain and leg swelling.  Gastrointestinal:  Positive for abdominal pain and blood in stool. Negative for nausea and vomiting.  Genitourinary:  Positive for hematuria. Negative for dysuria.  Neurological:  Negative for weakness.  All other systems reviewed and are negative.   Physical Exam Updated Vital Signs BP (!) 182/89   Pulse (!) 52   Temp 97.7 F (36.5 C) (Oral)   Resp 13   Ht 5\' 1"  (1.549 m)   Wt 72.2 kg   SpO2 100%   BMI 30.08 kg/m  Physical Exam Vitals and nursing note reviewed.   Constitutional:      General: She is not in acute distress.    Appearance: She is well-developed.  HENT:     Head: Normocephalic and atraumatic.  Eyes:     Conjunctiva/sclera: Conjunctivae normal.  Cardiovascular:     Rate and Rhythm: Normal rate and regular rhythm.     Heart sounds: No murmur heard. Pulmonary:     Effort: Pulmonary effort is normal. No respiratory distress.     Breath sounds: Normal breath sounds.  Abdominal:     Palpations: Abdomen is soft.     Tenderness: There is abdominal tenderness.     Comments: Tenderness to palpation of entire abdomen.  Nonfocal.  Musculoskeletal:        General: No swelling.     Cervical back: Neck supple.  Skin:    General: Skin is warm and dry.     Capillary Refill: Capillary refill takes less than 2 seconds.  Neurological:     Mental Status: She is alert and oriented to person, place, and time.     Comments: CN III through XII.  Intact finger-to-nose, heel-to-shin.  No pronator drift or slurred speech.  Equal grip strength upper extremities bilaterally.  Equal strength bilateral lower extremities.  Pupils PERRL.  Tracks across midline.  Psychiatric:        Mood and Affect: Mood normal.     ED Results / Procedures / Treatments   Labs (all labs ordered are listed, but only abnormal results are displayed) Labs Reviewed  CBC WITH DIFFERENTIAL/PLATELET - Abnormal; Notable for the following components:      Result Value   RBC 3.11 (*)    Hemoglobin 10.5 (*)    HCT 33.3 (*)    MCV 107.1 (*)    Abs Immature Granulocytes 0.11 (*)    All other components within normal limits  COMPREHENSIVE METABOLIC PANEL WITH GFR - Abnormal; Notable for the following components:   Sodium 133 (*)    Chloride 90 (*)    CO2 33 (*)    Glucose, Bld 115 (*)    BUN 67 (*)    Creatinine, Ser 2.84 (*)    Total Protein 6.2 (*)    Albumin 3.2 (*)    GFR, Estimated 16 (*)  All other components within normal limits  BRAIN NATRIURETIC PEPTIDE -  Abnormal; Notable for the following components:   B Natriuretic Peptide 100.5 (*)    All other components within normal limits  POC OCCULT BLOOD, ED - Abnormal; Notable for the following components:   Fecal Occult Bld POSITIVE (*)    All other components within normal limits  URINALYSIS, ROUTINE W REFLEX MICROSCOPIC  OCCULT BLOOD X 1 CARD TO LAB, STOOL  I-STAT CG4 LACTIC ACID, ED    EKG None  Radiology DG Chest 1 View Result Date: 02/13/2024 CLINICAL DATA:  Wheezing. EXAM: CHEST  1 VIEW COMPARISON:  Chest radiographs 01/12/2024 and 08/02/2023 FINDINGS: Mildly to moderately decreased lung volumes, worsened from prior. Cardiac silhouette appears unchanged in size, likely the upper limits of normal for AP technique. Mediastinal contours are grossly unchanged and grossly within normal limits for AP technique. Mild calcification within the aortic arch. Mild bibasilar bronchovascular crowding due to low lung volumes. No acute airspace opacity. No pleural effusion pneumothorax. High-riding right humeral head again seen, suggesting a chronic superior rotator cuff tear. Moderate to severe bilateral glenohumeral osteoarthritis. IMPRESSION: Mildly to moderately decreased lung volumes, worsened from prior. No acute airspace opacity. Electronically Signed   By: Bertina Broccoli M.D.   On: 02/13/2024 12:25    Procedures Procedures   Medications Ordered in ED Medications  pantoprazole  (PROTONIX ) injection 40 mg (40 mg Intravenous Given 02/13/24 1311)  sodium chloride  0.9 % bolus 500 mL (0 mLs Intravenous Stopped 02/13/24 1424)    ED Course/ Medical Decision Making/ A&P  Medical Decision Making Amount and/or Complexity of Data Reviewed Labs: ordered. Radiology: ordered.  Risk Prescription drug management. Decision regarding hospitalization.   88 year old female presents for evaluation.  Please see HPI for further details.  On examination patient is afebrile and nontachycardic.  Her lung sounds  are clear bilaterally, she is not hypoxic.  Abdomen soft and compressible throughout however she does endorse tenderness throughout her entire abdomen.  No rebound or guarding, no CVA tenderness.  Nonfocal.  Neurological examinations at baseline.  No edema to bilateral lower extremities.  Overall nontoxic appearance.  Will work patient up for multiple complaints.  Will collect chest x-ray, BNP for shortness of breath.  Will collect CBC, CMP, point-of-care occult card, lactic acid for concern for GI bleed.  The patient CBC without leukocytosis, downturn hemoglobin 10.5.  Patient metabolic panel with sodium 133, creatinine 2.84.  Patient creatinine does not steadily uptrending over the last 5 months.  Anion gap 10.  BUN 67.  Patient alert and orient x 4.  Lactic acid 1.2.  BNP elevated 100.5.  Fecal occult card positive.  Chest x-ray unremarkable.  No evidence of consolidations, effusions.  CT head unremarkable.  Chest x-ray unremarkable.  CT scan of her abdomen is pending at this time.  Patient provided with Protonix  for GI bleed.  Given 500 bag of fluid.  Discussed patient with Mai Schwalbe, PA-C of [facility].  She states that they will consult on the patient while admitted.  Discussed patient with Dr. Arrien.  He agrees to admit patient.  Stable admission.   Final Clinical Impression(s) / ED Diagnoses Final diagnoses:  Hematuria, unspecified type  Hematochezia  Generalized weakness    Rx / DC Orders ED Discharge Orders     None         Adel Aden, PA-C 02/13/24 1601    Roberts Ching, MD 02/22/24 1735

## 2024-02-13 NOTE — ED Provider Notes (Addendum)
 [facility]    CSN: [id number] Arrival date & time: 02/13/24  1016      History   Chief Complaint Chief Complaint  Patient presents with   Blood In Stools   Diarrhea   Fatigue   Hematuria   Altered Mental Status    HPI [name] is a 88 y.o. female.    Diarrhea Hematuria  Altered Mental Status Here for persistent diarrhea, rectal bleeding and hematuria, and confusion.  She was seen on April 8 for loose stools and rectal bleeding by her primary care.  She was prescribed Lomotil  for that and prednisone  for what appeared to be an asthma exacerbation.  The frequency of her diarrhea has increased and she has become incontinent of stool.  She notes blood in her urine also today at triage.  No fever or chills  When lab was done on April 8 her creatinine was about 0.6 higher than it usually is.  No change in therapies were done at that time.  Today she states she feels very thirsty all the time, and her daughter noted her to be mildly confused yesterday, having a hard time understanding what was said in the room and also having a hard time attending to what was being done, such as a bath.    Past Medical History:  Diagnosis Date   Allergic rhinitis 10/30/2016   Anemia    Asthma    Breast cancer of upper-outer quadrant of left female breast (HCC) 11/08/2013   ER/PR+ Her2- Left IDC    Chronic renal insufficiency    Chronic rhinitis    Colon polyp    Diastolic dysfunction 07/10/2016   DJD (degenerative joint disease)    Dyspnea    Frozen shoulder    Full dentures    GERD (gastroesophageal reflux disease)    Hearing loss    Hypertension    Hyponatremia    Impaired glucose tolerance 07/18/2014   Memory loss    Morbid obesity (HCC)    Poor circulation    Vertigo    Wears glasses     Patient Active Problem List   Diagnosis Date Noted   Orthostasis 08/28/2023   HLD (hyperlipidemia) 06/25/2023   Dry mouth 12/19/2022   Right leg pain  10/03/2022   Degenerative lumbar spinal stenosis 09/29/2022   Scoliosis of lumbar spine 09/29/2022   Snoring 07/16/2022   Daytime somnolence 04/30/2022   B12 deficiency 02/08/2022   Primary osteoarthritis of both shoulders 02/04/2022   Carcinoma in situ of left breast 12/01/2021   Hypertensive heart and chronic kidney disease with heart failure and stage 1 through stage 4 chronic kidney disease, or unspecified chronic kidney disease (HCC) 08/08/2021   Acute metabolic encephalopathy    Sepsis with encephalopathy without septic shock (HCC)    AMS (altered mental status) 07/29/2021   Altered mental status 07/27/2021   Chronic diastolic CHF (congestive heart failure) (HCC) 07/27/2021   Right hip pain 06/27/2021   Right knee pain 06/27/2021   Acute diastolic congestive heart failure (HCC) 06/18/2021   Calcium pyrophosphate deposition disease 02/12/2021   Fibromyalgia 02/12/2021   Polymyalgia rheumatica (HCC) 02/12/2021   Inflammatory arthritis 02/12/2021   Left knee pain 02/12/2021   Long term (current) use of systemic steroids 02/01/2021   Nail disorder 01/24/2021   Toe pain, right 01/24/2021   Chondrocalcinosis 12/19/2020   Primary osteoarthritis of both hands 12/19/2020   Pain in joint of left shoulder 08/22/2020   Pain in joint of right shoulder  08/22/2020   Shoulder pain 07/31/2020   Chronic pain 04/23/2020   Supraclavicular fossa fullness 04/23/2020   Finding of neck region 04/23/2020   Low grade fever 12/07/2019   OAB (overactive bladder) 12/07/2019   Pain of right heel 12/07/2019   Overactive bladder 12/07/2019   Diarrhea 04/13/2019   Abnormal gait 08/30/2018   Asthenia 08/30/2018   Conjunctivitis of left eye 04/13/2018   Rash 04/13/2018   Eruption at injection site 04/13/2018   Acute bilateral deep vein thrombosis (DVT) of femoral veins (HCC) 01/31/2018   Hypokalemia 01/31/2018   Electrocardiogram abnormal 01/31/2018   Increased creatine kinase level 01/31/2018    Left leg pain 01/13/2018   Left leg swelling 01/13/2018   Hearing loss 01/13/2018   Intractable pain 12/27/2017   Arachnoiditis 12/27/2017   Pain 12/27/2017   Hyponatremia    S/P lumbar laminectomy 12/23/2017   History of lumbar laminectomy 12/23/2017   Wheezing 12/10/2017   Itching 11/22/2017   Urinary frequency 11/22/2017   Increased frequency of urination 11/22/2017   Degeneration of lumbar intervertebral disc 11/05/2017   Sciatica 09/04/2017   Asthma exacerbation 08/31/2017   Acute asthma 08/31/2017   Flare of rheumatoid arthritis (HCC) 05/11/2017   Articular gout 05/01/2017   Vertigo 10/30/2016   Allergic rhinitis 10/30/2016   Incontinence of feces 07/31/2016   Hemorrhoids 07/31/2016   Hematochezia 07/31/2016   Peripheral venous insufficiency 07/10/2016   Diastolic dysfunction 07/10/2016   Peripheral edema 07/10/2016   Left ankle pain 06/02/2016   Neck pain 03/10/2016   Low back pain 03/10/2016   Right cervical radiculopathy 11/13/2015   Bradycardia 03/15/2015   Joint pain 12/18/2014   Fever 11/27/2014   Impaired glucose tolerance 07/18/2014   Right lumbar radiculopathy 07/18/2014   Left lumbar radiculopathy 07/18/2014   Encounter for well adult exam with abnormal findings 02/20/2014   Malignant neoplasm of upper-outer quadrant of female breast (HCC) 11/08/2013   Diverticular disease 07/09/2012   Headache 06/30/2012   Anemia 06/29/2012   Dyspnea 03/11/2012   Cough 03/11/2012   CKD (chronic kidney disease) stage 4, GFR 15-29 ml/min (HCC) 05/18/2011   Chronic kidney disease, stage 4 (severe) (HCC) 05/18/2011   Gastroesophageal reflux disease 07/05/2008   Vitamin D  deficiency 12/19/2007   Morbid obesity (HCC) 12/16/2007   Essential hypertension 12/16/2007   DEGENERATIVE JOINT DISEASE 12/16/2007   Osteoarthritis 12/16/2007    Past Surgical History:  Procedure Laterality Date   ABDOMINAL HYSTERECTOMY     BREAST LUMPECTOMY WITH NEEDLE LOCALIZATION AND AXILLARY  SENTINEL LYMPH NODE BX Left 12/04/2013   Procedure: BREAST LUMPECTOMY WITH NEEDLE LOCALIZATION AND AXILLARY SENTINEL LYMPH NODE BX;  Surgeon: Thayne Fine, MD;  Location: [facility];  Service: General;  Laterality: Left;   CATARACT EXTRACTION  2009   rt   COLONOSCOPY     EYE SURGERY Bilateral    cataract surgery   KNEE ARTHROSCOPY     both   LUMBAR LAMINECTOMY/DECOMPRESSION MICRODISCECTOMY Left 12/23/2017   Procedure: Left Lumbar One-Two Laminectomy with microdiscectomy;  Surgeon: Isadora Mar, MD;  Location: [facility] OR;  Service: Neurosurgery;  Laterality: Left;  Left L1-2 Laminectomy with microdiscectomy   TONSILLECTOMY     TOTAL KNEE ARTHROPLASTY  2002   rt   TOTAL KNEE ARTHROPLASTY  2003   left   VESICOVAGINAL FISTULA CLOSURE W/ TAH  1980    OB History   No obstetric history on file.      Home Medications    Prior to Admission medications  Medication Sig Start Date End Date Taking? Authorizing Provider  albuterol  (PROVENTIL ) (2.5 MG/3ML) 0.083% nebulizer solution Take 3 mLs (2.5 mg total) by nebulization every 6 (six) hours as needed for wheezing or shortness of breath. 01/12/24  Yes Roslyn Coombe, MD  albuterol  (VENTOLIN  HFA) 108 [phone] Base) MCG/ACT inhaler Inhale 1 puff into the lungs every 6 (six) hours as needed for wheezing or shortness of breath. 08/24/23  Yes Roslyn Coombe, MD  amLODipine  (NORVASC ) 5 MG tablet TAKE 1 TABLET BY MOUTH EVERY DAY Patient taking differently: Take 5 mg by mouth every morning. 10/08/20  Yes Roslyn Coombe, MD  aspirin  EC 81 MG tablet Take 81 mg by mouth every morning. Swallow whole.   Yes [provider]  Cholecalciferol (VITAMIN D3) 20 MCG (800 UNIT) TABS Take 1 tablet by mouth daily. 08/08/21  Yes Verlyn Goad, MD  dapagliflozin  propanediol (FARXIGA ) 10 MG TABS tablet Take 1 tablet (10 mg total) by mouth daily before breakfast. 05/17/23  Yes Roslyn Coombe, MD  diphenoxylate -atropine  (LOMOTIL ) 2.5-0.025 MG tablet Take 1  tablet by mouth 4 (four) times daily as needed for diarrhea or loose stools. 02/01/24  Yes Roslyn Coombe, MD  fluticasone -salmeterol (WIXELA INHUB) 500-50 MCG/ACT AEPB Inhale 1 puff into the lungs in the morning and at bedtime. 06/25/23  Yes Roslyn Coombe, MD  furosemide  (LASIX ) 40 MG tablet Take 1 tablet (40 mg total) by mouth every Monday, Wednesday, and Friday. 08/08/21  Yes Verlyn Goad, MD  gabapentin  (NEURONTIN ) 300 MG capsule TAKE 1 CAPSULE BY MOUTH THREE TIMES A DAY 09/27/23  Yes Roslyn Coombe, MD  hydrocortisone  (ANUSOL -HC) 2.5 % rectal cream Place 1 Application rectally 2 (two) times daily. 02/01/24  Yes Roslyn Coombe, MD  losartan  (COZAAR ) 100 MG tablet Take 100 mg by mouth daily. 12/06/21  Yes [provider]  Magnesium  Glycinate 120 MG CAPS Take 2 tablets by mouth at bedtime. 01/07/24  Yes Raulkar, Keven Pel, MD  metoprolol  succinate (TOPROL -XL) 25 MG 24 hr tablet TAKE 1 TABLET (25 MG TOTAL) BY MOUTH DAILY. 02/08/24  Yes Roslyn Coombe, MD  montelukast  (SINGULAIR ) 10 MG tablet TAKE 1 TABLET BY MOUTH EVERY DAY 06/01/22  Yes Roslyn Coombe, MD  Multiple Vitamin (MULTIVITAMIN WITH MINERALS) TABS tablet Take 1 tablet by mouth daily. Centrum Silver   Yes [provider]  pantoprazole  (PROTONIX ) 40 MG tablet TAKE 1 TABLET BY MOUTH EVERY DAY 11/23/22  Yes Roslyn Coombe, MD  potassium chloride  (KLOR-CON ) 10 MEQ tablet TAKE 1 TABLET BY MOUTH EVERY DAY WHEN TAKING FUROSEMIDE  Patient taking differently: Take 10 mEq by mouth every Monday, Wednesday, and Friday. 06/07/21  Yes Roslyn Coombe, MD  predniSONE  (DELTASONE ) 5 MG tablet Take 1 tablet (5 mg total) by mouth daily with breakfast. 02/10/24  Yes Raulkar, Keven Pel, MD  allopurinol  (ZYLOPRIM ) 100 MG tablet Take 100 mg by mouth daily. 04/17/22   [provider]  celecoxib  (CELEBREX ) 100 MG capsule Take 100 mg by mouth 2 (two) times daily. 03/06/23   [provider]  Ensure (ENSURE) Take 1 Can by mouth 3 (three) times daily  between meals. Patient taking differently: Take 237 mLs by mouth See admin instructions. Drink one can (237 mls) by mouth once or twice daily 05/23/19   Roslyn Coombe, MD  febuxostat  (ULORIC ) 40 MG tablet Take 40 mg by mouth daily. 05/25/22   [provider]  FLUAD QUADRIVALENT 0.5 ML injection  06/30/22  [provider]  fluticasone  (FLONASE) 50 MCG/ACT nasal spray Place into both nostrils. 01/25/24   [provider]  Incontinence Supply Disposable (DEPEND UNDERWEAR SM/MED) MISC Use as directed four times per day 05/23/19   Roslyn Coombe, MD  lidocaine  (LIDODERM ) 5 % Place 1 patch onto the skin daily. Remove & Discard patch within 12 hours or as directed by MD 12/07/22   Raulkar, Keven Pel, MD  meclizine  (ANTIVERT ) 12.5 MG tablet TAKE 1 TABLET BY MOUTH THREE TIMES A DAY AS NEEDED FOR DIZZINESS Patient taking differently: Take 12.5 mg by mouth 3 (three) times daily as needed for dizziness. 03/03/21   Roslyn Coombe, MD  predniSONE  (DELTASONE ) 10 MG tablet 3 tabs by mouth per day for 3 days,2tabs per day for 3 days,1tab per day for 3 days 01/12/24   Roslyn Coombe, MD  predniSONE  (DELTASONE ) 10 MG tablet 3 tabs by mouth per day for 3 days,2tabs per day for 3 days,1tab per day for 3 days 02/01/24   Roslyn Coombe, MD  [facility] syringe  08/03/22   [provider]  triamcinolone  cream (KENALOG ) 0.1 % APPLY TO AFFECTED AREA TWICE A DAY 09/11/22   Roslyn Coombe, MD    Family History Family History  Problem Relation Age of Onset   Colon cancer Mother        in her 60's   Stomach cancer Mother    Lung cancer Brother        was a smoker   Heart attack Son 99   Healthy Daughter     Social History Social History   Tobacco Use   Smoking status: Never    Passive exposure: Never   Smokeless tobacco: Never  Vaping Use   Vaping status: Never Used  Substance Use Topics   Alcohol use: No    Alcohol/week: 0.0 standard drinks of alcohol   Drug use: No     Allergies    Hydrocodone , Hydrocodone -acetaminophen , Lasix  [furosemide ], Tizanidine , Tizanidine  hcl, and Iron    Review of Systems Review of Systems  Gastrointestinal:  Positive for diarrhea.  Genitourinary:  Positive for hematuria.     Physical Exam Triage Vital Signs ED Triage Vitals [02/13/24 1033]  Encounter Vitals Group     BP (!) 157/62     Systolic BP Percentile      Diastolic BP Percentile      Pulse Rate (!) 52     Resp (!) 22     Temp 98.2 F (36.8 C)     Temp Source Oral     SpO2 100 %     Weight      Height      Head Circumference      Peak Flow      Pain Score      Pain Loc      Pain Education      Exclude from Growth Chart    No data found.  Updated Vital Signs BP (!) 157/62 (BP Location: Right Arm)   Pulse (!) 52   Temp 98.2 F (36.8 C) (Oral)   Resp (!) 22   SpO2 100%   Visual Acuity Right Eye Distance:   Left Eye Distance:   Bilateral Distance:    Right Eye Near:   Left Eye Near:    Bilateral Near:     Physical Exam Vitals reviewed.  Constitutional:      General: She is not in acute distress.    Appearance: She is not toxic-appearing.  HENT:     Nose: Nose normal.     Mouth/Throat:     Comments: Mucous membranes are mildly pale.  They are still shiny. Eyes:     Extraocular Movements: Extraocular movements intact.     Conjunctiva/sclera: Conjunctivae normal.     Pupils: Pupils are equal, round, and reactive to light.  Cardiovascular:     Rate and Rhythm: Normal rate and regular rhythm.     Heart sounds: No murmur heard. Pulmonary:     Effort: Pulmonary effort is normal. No respiratory distress.     Breath sounds: No stridor. No wheezing, rhonchi or rales.  Abdominal:     Palpations: Abdomen is soft.  Musculoskeletal:     Cervical back: Neck supple.  Lymphadenopathy:     Cervical: No cervical adenopathy.  Skin:    Capillary Refill: Capillary refill takes less than 2 seconds.     Coloration: Skin is not jaundiced or pale.  Neurological:      General: No focal deficit present.     Mental Status: She is alert and oriented to person, place, and time.  Psychiatric:        Behavior: Behavior normal.      UC Treatments / Results  Labs (all labs ordered are listed, but only abnormal results are displayed) Labs Reviewed  POCT URINALYSIS DIP (MANUAL ENTRY) - Abnormal; Notable for the following components:      Result Value   Clarity, UA hazy (*)    Glucose, UA =500 (*)    Blood, UA large (*)    Leukocytes, UA Trace (*)    All other components within normal limits  URINE CULTURE    EKG   Radiology No results found.  Procedures Procedures (including critical care time)  Medications Ordered in UC Medications - No data to display  Initial Impression / Assessment and Plan / UC Course  I have reviewed the triage vital signs and the nursing notes.  Pertinent labs & imaging results that were available during my care of the patient were reviewed by me and considered in my medical decision making (see chart for details).     I am concerned that the patient needs to be evaluated emergency room due to her altered mental state and probable dehydration.  She does have a large amount of blood and a trace of leukocytes on her urinalysis that had been collected before I saw her.  Urine culture is sent, as I am concerned that she may not be able to urinate soon when she is seen in the emergency room. Her daughter will take her to the emergency room by private car.  Final Clinical Impressions(s) / UC Diagnoses   Final diagnoses:  Altered mental status, unspecified altered mental status type  Dehydration  Rectal bleeding  Hematuria, unspecified type     Discharge Instructions      Patient's daughter will take her to the emergency room by private car for further evaluation and treatment.     ED Prescriptions   None    PDMP not reviewed this encounter.   Ann Keto, MD 02/13/24 1106    Ann Keto, MD 02/13/24 [phone]

## 2024-02-13 NOTE — Assessment & Plan Note (Signed)
 Continue with pantoprazole/.

## 2024-02-13 NOTE — Consult Note (Cosign Needed)
 Consultation Note   Referring Provider:  Emergency Medicine PCP: Roslyn Coombe, MD Primary Gastroenterologist::  Legrand Puma, MD         Reason for Consultation:  GI bleed DOA: 02/13/2024         Hospital Day: 1   ASSESSMENT    88 y.o. year old female with a medical history including but not limited to GERD, colon polyps, chronic anemia , chronic diastolic heart failure, CKD 4, chronic pain, chronic diarrhea/incontinence  Loose , bloody stool x 2 weeks  New macrocytic anemia Years ago she had frequent diarrhea. She had an unremarkable colonoscopy with random biopsies in 2018. At some point it sounds like diarrhea resolved. Now with progressive diarrhea with blood over last few weeks. DDx: Rule out infectious colitis and / or hemorrhoidal bleeding. Stuttering diverticular hemorrhage possible but would be unusual to last this long. Colon neoplasm seems unlikely.   CKD 4  See PMH for additional history    PLAN:   --Clear liquid diet --GI path panel, C-diff --Iron  studies, B12, folate --Empiric tx of hemorrhoids with anusol  suppositories BID --Discussed colonoscopy, especially if bleeding persists and stools studies negative. She needs to think about it.   --Trend H/H --Will check back on her tomorrow.    HPI   Brief GI history  Patient was last seen in the office by Dr. Elvin Hammer in July 2019.  At that time she was there to be evaluated for anemia.  Iron  studies did not show clear-cut iron  deficiency.  She had no overt bleeding.  Colonoscopy had been done the year prior for rectal bleeding and diarrhea .  No polyps or cancers were found was negative for polyps or cancer ( see full report below)  Interval history Patient presented to the ED today with diarrhea with blood.  She has been complaining of discomfort in right mid abdomen but this was present before GI symptoms started. Patient saw her PCP on 4/8 , was having diarrhea with  scant blood at that time. She declined a GI referral. She was given Anusol  supp and labs checked. Advised take lomotil  as needed. Hgb was 11.8 ( near baseline).   Timeline unclear bin relationship to symptoms but both patient and daughter got diarrhea after eating [place]ili at a fast food restaurant  In the ED she is bradycardic but normotensive . Her hemoglobin is 10.5, it was 11.8 on 02/01/2024 ( baseline is in the low 12 range).  WBC normal, she is macrocytic.  FOBT positive.   CT scan without contrast does not show anything acute  IMPRESSION: 1. No acute findings. 2. Colonic diverticulosis. 3. Bilateral hyperdense renal cortical lesions, largest 3.8 cm left upper pole, as described on previous MR. 4. Geographic ground-glass opacities in the lung bases, of indeterminate acuity. 5.  Aortic Atherosclerosis (ICD10-I70.0).    Previous GI Studies   Colonoscopy March 2018 for diarrhea and rectal bleeding - Diverticulosis in the left colon and in the right colon. - Internal hemorrhoids. - The examination was otherwise normal on direct and retroflexion views. - Biopsies were taken with a cold forceps from the entire colon for evaluation of microscopic colitis  Labs and Imaging:  Recent Labs    02/13/24 1123  WBC 7.6  HGB 10.5*  HCT 33.3*  MCV 107.1*  PLT 209   No results for input(s): "FOLATE", "VITAMINB12", "FERRITIN", "TIBC", "IRONPCTSAT" in the last 72 hours. Recent Labs    02/13/24 1123  NA 133*  K 4.3  CL 90*  CO2 33*  GLUCOSE 115*  BUN 67*  CREATININE 2.84*  CALCIUM 8.9   Recent Labs    02/13/24 1123  PROT 6.2*  ALBUMIN 3.2*  AST 23  ALT 20  ALKPHOS 48  BILITOT 0.5   No results for input(s): "INR" in the last 72 hours. No results for input(s): "AFPTUMOR" in the last 72 hours.  CT ABDOMEN PELVIS WO CONTRAST CLINICAL DATA:  Abdominal pain, acute, nonlocalized hematuria, r flank pain, hematochezia  EXAM: CT ABDOMEN AND PELVIS WITHOUT  CONTRAST  TECHNIQUE: Multidetector CT imaging of the abdomen and pelvis was performed following the standard protocol without IV contrast.  RADIATION DOSE REDUCTION: This exam was performed according to the departmental dose-optimization program which includes automated exposure control, adjustment of the mA and/or kV according to patient size and/or use of iterative reconstruction technique.  COMPARISON:  MR 12/10/2022  FINDINGS: Lower chest: No pleural or pericardial effusion. Geographic ground-glass opacities in the lung bases, of indeterminate acuity.  Hepatobiliary: No focal liver abnormality is seen. No gallstones, gallbladder wall thickening, or biliary dilatation.  Pancreas: Unremarkable. No pancreatic ductal dilatation or surrounding inflammatory changes.  Spleen: Normal in size without focal abnormality.  Adrenals/Urinary Tract: No adrenal mass. No hydronephrosis or urolithiasis. Bilateral hyperdense renal cortical lesions, largest 3.8 cm left upper pole, as described on previous MR. Urinary bladder physiologically distended.  Stomach/Bowel: Small hiatal hernia. Stomach is decompressed, without acute finding. Small bowel is nondilated. Appendix not discretely identified. No pericecal inflammatory/edematous change. The colon is partially distended, with scattered diverticula throughout, without adjacent inflammatory change.  Vascular/Lymphatic: Mild scattered aortoiliac calcified plaque without aneurysm. No abdominal or pelvic adenopathy.  Reproductive: Status post hysterectomy. No adnexal masses.  Other: Bilateral pelvic phleboliths.  No ascites.  No free air.  Musculoskeletal: Lumbar levoscoliosis with multilevel spondylitic change. Bilateral sacroiliitis left greater than right.  IMPRESSION: 1. No acute findings. 2. Colonic diverticulosis. 3. Bilateral hyperdense renal cortical lesions, largest 3.8 cm left upper pole, as described on previous MR. 4.  Geographic ground-glass opacities in the lung bases, of indeterminate acuity. 5.  Aortic Atherosclerosis (ICD10-I70.0).  Electronically Signed   By: Nicoletta Barrier M.D.   On: 02/13/2024 16:06 DG Chest 1 View CLINICAL DATA:  Wheezing.  EXAM: CHEST  1 VIEW  COMPARISON:  Chest radiographs 01/12/2024 and 08/02/2023  FINDINGS: Mildly to moderately decreased lung volumes, worsened from prior. Cardiac silhouette appears unchanged in size, likely the upper limits of normal for AP technique. Mediastinal contours are grossly unchanged and grossly within normal limits for AP technique. Mild calcification within the aortic arch. Mild bibasilar bronchovascular crowding due to low lung volumes. No acute airspace opacity. No pleural effusion pneumothorax. High-riding right humeral head again seen, suggesting a chronic superior rotator cuff tear. Moderate to severe bilateral glenohumeral osteoarthritis.  IMPRESSION: Mildly to moderately decreased lung volumes, worsened from prior. No acute airspace opacity.  Electronically Signed   By: Bertina Broccoli M.D.   On: 02/13/2024 12:25    Past Medical History:  Diagnosis Date   Allergic rhinitis 10/30/2016   Anemia    Asthma    Breast cancer of upper-outer quadrant of left female breast (HCC) 11/08/2013   ER/PR+ Her2- Left IDC    Chronic  renal insufficiency    Chronic rhinitis    Colon polyp    Diastolic dysfunction 07/10/2016   DJD (degenerative joint disease)    Dyspnea    Frozen shoulder    Full dentures    GERD (gastroesophageal reflux disease)    Hearing loss    Hypertension    Hyponatremia    Impaired glucose tolerance 07/18/2014   Memory loss    Morbid obesity (HCC)    Poor circulation    Vertigo    Wears glasses     Past Surgical History:  Procedure Laterality Date   ABDOMINAL HYSTERECTOMY     BREAST LUMPECTOMY WITH NEEDLE LOCALIZATION AND AXILLARY SENTINEL LYMPH NODE BX Left 12/04/2013   Procedure: BREAST LUMPECTOMY WITH  NEEDLE LOCALIZATION AND AXILLARY SENTINEL LYMPH NODE BX;  Surgeon: Thayne Fine, MD;  Location: [facility];  Service: General;  Laterality: Left;   CATARACT EXTRACTION  2009   rt   COLONOSCOPY     EYE SURGERY Bilateral    cataract surgery   KNEE ARTHROSCOPY     both   LUMBAR LAMINECTOMY/DECOMPRESSION MICRODISCECTOMY Left 12/23/2017   Procedure: Left Lumbar One-Two Laminectomy with microdiscectomy;  Surgeon: Isadora Mar, MD;  Location: [facility];  Service: Neurosurgery;  Laterality: Left;  Left L1-2 Laminectomy with microdiscectomy   TONSILLECTOMY     TOTAL KNEE ARTHROPLASTY  2002   rt   TOTAL KNEE ARTHROPLASTY  2003   left   VESICOVAGINAL FISTULA CLOSURE W/ TAH  1980    Family History  Problem Relation Age of Onset   Colon cancer Mother        in her 22's   Stomach cancer Mother    Lung cancer Brother        was a smoker   Heart attack Son 70   Healthy Daughter     Prior to Admission medications   Medication Sig Start Date End Date Taking? Authorizing Provider  albuterol  (PROVENTIL ) (2.5 MG/3ML) 0.083% nebulizer solution Take 3 mLs (2.5 mg total) by nebulization every 6 (six) hours as needed for wheezing or shortness of breath. 01/12/24   Roslyn Coombe, MD  albuterol  (VENTOLIN  HFA) 108 (90 Base) MCG/ACT inhaler Inhale 1 puff into the lungs every 6 (six) hours as needed for wheezing or shortness of breath. 08/24/23   Roslyn Coombe, MD  allopurinol  (ZYLOPRIM ) 100 MG tablet Take 100 mg by mouth daily. 04/17/22   [provider]  amLODipine  (NORVASC ) 5 MG tablet TAKE 1 TABLET BY MOUTH EVERY DAY Patient taking differently: Take 5 mg by mouth every morning. 10/08/20   Roslyn Coombe, MD  aspirin  EC 81 MG tablet Take 81 mg by mouth every morning. Swallow whole.    [provider]  celecoxib  (CELEBREX ) 100 MG capsule Take 100 mg by mouth 2 (two) times daily. 03/06/23   [provider]  Cholecalciferol (VITAMIN D3) 20 MCG (800 UNIT) TABS Take 1  tablet by mouth daily. 08/08/21   Verlyn Goad, MD  dapagliflozin  propanediol (FARXIGA ) 10 MG TABS tablet Take 1 tablet (10 mg total) by mouth daily before breakfast. 05/17/23   Roslyn Coombe, MD  diphenoxylate -atropine  (LOMOTIL ) 2.5-0.025 MG tablet Take 1 tablet by mouth 4 (four) times daily as needed for diarrhea or loose stools. 02/01/24   Roslyn Coombe, MD  Ensure (ENSURE) Take 1 Can by mouth 3 (three) times daily between meals. Patient taking differently: Take 237 mLs by mouth See admin instructions. Drink one can (  237 mls) by mouth once or twice daily 05/23/19   Roslyn Coombe, MD  febuxostat  (ULORIC ) 40 MG tablet Take 40 mg by mouth daily. 05/25/22   [provider]  FLUAD QUADRIVALENT 0.5 ML injection  06/30/22   [provider]  fluticasone  (FLONASE) 50 MCG/ACT nasal spray Place into both nostrils. 01/25/24   [provider]  fluticasone -salmeterol (WIXELA INHUB) 500-50 MCG/ACT AEPB Inhale 1 puff into the lungs in the morning and at bedtime. 06/25/23   Roslyn Coombe, MD  furosemide  (LASIX ) 40 MG tablet Take 1 tablet (40 mg total) by mouth every Monday, Wednesday, and Friday. 08/08/21   Verlyn Goad, MD  gabapentin  (NEURONTIN ) 300 MG capsule TAKE 1 CAPSULE BY MOUTH THREE TIMES A DAY 09/27/23   Roslyn Coombe, MD  hydrocortisone  (ANUSOL -HC) 2.5 % rectal cream Place 1 Application rectally 2 (two) times daily. 02/01/24   Roslyn Coombe, MD  Incontinence Supply Disposable (DEPEND UNDERWEAR SM/MED) MISC Use as directed four times per day 05/23/19   Roslyn Coombe, MD  lidocaine  (LIDODERM ) 5 % Place 1 patch onto the skin daily. Remove & Discard patch within 12 hours or as directed by MD 12/07/22   Raulkar, Keven Pel, MD  losartan  (COZAAR ) 100 MG tablet Take 100 mg by mouth daily. 12/06/21   [provider]  Magnesium  Glycinate 120 MG CAPS Take 2 tablets by mouth at bedtime. 01/07/24   Raulkar, Keven Pel, MD  meclizine  (ANTIVERT ) 12.5 MG tablet TAKE 1 TABLET BY MOUTH THREE TIMES  A DAY AS NEEDED FOR DIZZINESS Patient taking differently: Take 12.5 mg by mouth 3 (three) times daily as needed for dizziness. 03/03/21   Roslyn Coombe, MD  metoprolol  succinate (TOPROL -XL) 25 MG 24 hr tablet TAKE 1 TABLET (25 MG TOTAL) BY MOUTH DAILY. 02/08/24   Roslyn Coombe, MD  montelukast  (SINGULAIR ) 10 MG tablet TAKE 1 TABLET BY MOUTH EVERY DAY 06/01/22   Roslyn Coombe, MD  Multiple Vitamin (MULTIVITAMIN WITH MINERALS) TABS tablet Take 1 tablet by mouth daily. Centrum Silver    [provider]  pantoprazole  (PROTONIX ) 40 MG tablet TAKE 1 TABLET BY MOUTH EVERY DAY 11/23/22   Roslyn Coombe, MD  potassium chloride  (KLOR-CON ) 10 MEQ tablet TAKE 1 TABLET BY MOUTH EVERY DAY WHEN TAKING FUROSEMIDE  Patient taking differently: Take 10 mEq by mouth every Monday, Wednesday, and Friday. 06/07/21   Roslyn Coombe, MD  predniSONE  (DELTASONE ) 10 MG tablet 3 tabs by mouth per day for 3 days,2tabs per day for 3 days,1tab per day for 3 days 01/12/24   Roslyn Coombe, MD  predniSONE  (DELTASONE ) 10 MG tablet 3 tabs by mouth per day for 3 days,2tabs per day for 3 days,1tab per day for 3 days 02/01/24   Roslyn Coombe, MD  predniSONE  (DELTASONE ) 5 MG tablet Take 1 tablet (5 mg total) by mouth daily with breakfast. 02/10/24   Raulkar, Krutika P, MD  [facility] syringe  08/03/22   [provider]  triamcinolone  cream (KENALOG ) 0.1 % APPLY TO AFFECTED AREA TWICE A DAY 09/11/22   Roslyn Coombe, MD    No current facility-administered medications for this encounter.   Current Outpatient Medications  Medication Sig Dispense Refill   albuterol  (PROVENTIL ) (2.5 MG/3ML) 0.083% nebulizer solution Take 3 mLs (2.5 mg total) by nebulization every 6 (six) hours as needed for wheezing or shortness of breath. 150 mL 1   albuterol  (VENTOLIN  HFA) 108 (90 Base) MCG/ACT inhaler Inhale 1 puff  into the lungs every 6 (six) hours as needed for wheezing or shortness of breath. 18 g 5   allopurinol  (ZYLOPRIM ) 100 MG tablet Take 100 mg  by mouth daily.     amLODipine  (NORVASC ) 5 MG tablet TAKE 1 TABLET BY MOUTH EVERY DAY (Patient taking differently: Take 5 mg by mouth every morning.) 90 tablet 3   aspirin  EC 81 MG tablet Take 81 mg by mouth every morning. Swallow whole.     celecoxib  (CELEBREX ) 100 MG capsule Take 100 mg by mouth 2 (two) times daily.     Cholecalciferol (VITAMIN D3) 20 MCG (800 UNIT) TABS Take 1 tablet by mouth daily.     dapagliflozin  propanediol (FARXIGA ) 10 MG TABS tablet Take 1 tablet (10 mg total) by mouth daily before breakfast. 90 tablet 3   diphenoxylate -atropine  (LOMOTIL ) 2.5-0.025 MG tablet Take 1 tablet by mouth 4 (four) times daily as needed for diarrhea or loose stools. 30 tablet 1   Ensure (ENSURE) Take 1 Can by mouth 3 (three) times daily between meals. (Patient taking differently: Take 237 mLs by mouth See admin instructions. Drink one can (237 mls) by mouth once or twice daily) 237 mL 12   febuxostat  (ULORIC ) 40 MG tablet Take 40 mg by mouth daily.     FLUAD QUADRIVALENT 0.5 ML injection      fluticasone  (FLONASE) 50 MCG/ACT nasal spray Place into both nostrils.     fluticasone -salmeterol (WIXELA INHUB) 500-50 MCG/ACT AEPB Inhale 1 puff into the lungs in the morning and at bedtime. 3 each 3   furosemide  (LASIX ) 40 MG tablet Take 1 tablet (40 mg total) by mouth every Monday, Wednesday, and Friday.     gabapentin  (NEURONTIN ) 300 MG capsule TAKE 1 CAPSULE BY MOUTH THREE TIMES A DAY 90 capsule 5   hydrocortisone  (ANUSOL -HC) 2.5 % rectal cream Place 1 Application rectally 2 (two) times daily. 30 g 1   Incontinence Supply Disposable (DEPEND UNDERWEAR SM/MED) MISC Use as directed four times per day 120 each 5   lidocaine  (LIDODERM ) 5 % Place 1 patch onto the skin daily. Remove & Discard patch within 12 hours or as directed by MD 30 patch 0   losartan  (COZAAR ) 100 MG tablet Take 100 mg by mouth daily.     Magnesium  Glycinate 120 MG CAPS Take 2 tablets by mouth at bedtime. 90 capsule 3   meclizine   (ANTIVERT ) 12.5 MG tablet TAKE 1 TABLET BY MOUTH THREE TIMES A DAY AS NEEDED FOR DIZZINESS (Patient taking differently: Take 12.5 mg by mouth 3 (three) times daily as needed for dizziness.) 90 tablet 2   metoprolol  succinate (TOPROL -XL) 25 MG 24 hr tablet TAKE 1 TABLET (25 MG TOTAL) BY MOUTH DAILY. 90 tablet 3   montelukast  (SINGULAIR ) 10 MG tablet TAKE 1 TABLET BY MOUTH EVERY DAY 90 tablet 2   Multiple Vitamin (MULTIVITAMIN WITH MINERALS) TABS tablet Take 1 tablet by mouth daily. Centrum Silver     pantoprazole  (PROTONIX ) 40 MG tablet TAKE 1 TABLET BY MOUTH EVERY DAY 90 tablet 3   potassium chloride  (KLOR-CON ) 10 MEQ tablet TAKE 1 TABLET BY MOUTH EVERY DAY WHEN TAKING FUROSEMIDE  (Patient taking differently: Take 10 mEq by mouth every Monday, Wednesday, and Friday.) 90 tablet 3   predniSONE  (DELTASONE ) 10 MG tablet 3 tabs by mouth per day for 3 days,2tabs per day for 3 days,1tab per day for 3 days 18 tablet 0   predniSONE  (DELTASONE ) 10 MG tablet 3 tabs by mouth per day for 3 days,2tabs per  day for 3 days,1tab per day for 3 days 18 tablet 0   predniSONE  (DELTASONE ) 5 MG tablet Take 1 tablet (5 mg total) by mouth daily with breakfast. 90 tablet 3   SPIKEVAX syringe      triamcinolone  cream (KENALOG ) 0.1 % APPLY TO AFFECTED AREA TWICE A DAY 30 g 1    Allergies as of 02/13/2024 - Review Complete 02/13/2024  Allergen Reaction Noted   Hydrocodone  Itching 12/26/2017   Hydrocodone -acetaminophen  Other (See Comments) 02/01/2024   Lasix  [furosemide ] Other (See Comments) 07/01/2021   Tizanidine  Other (See Comments) 01/31/2018   Tizanidine  hcl Other (See Comments) 02/01/2024   Iron  Hives and Other (See Comments) 01/01/2011    Social History   Socioeconomic History   Marital status: Widowed    Spouse name: Not on file   Number of children: 2   Years of education: Not on file   Highest education level: Not on file  Occupational History   Occupation: owns bakery and works PT for [employer]   Tobacco Use   Smoking status: Never    Passive exposure: Never   Smokeless tobacco: Never  Vaping Use   Vaping status: Never Used  Substance and Sexual Activity   Alcohol use: No    Alcohol/week: 0.0 standard drinks of alcohol   Drug use: No   Sexual activity: Not Currently  Other Topics Concern   Not on file  Social History Narrative   Not on file   Social Drivers of Health   Financial Resource Strain: Low Risk  (06/23/2023)   Overall Financial Resource Strain (CARDIA)    Difficulty of Paying Living Expenses: Not hard at all  Food Insecurity: No Food Insecurity (06/23/2023)   Hunger Vital Sign    Worried About Running Out of Food in the Last Year: Never true    Ran Out of Food in the Last Year: Never true  Transportation Needs: No Transportation Needs (06/23/2023)   PRAPARE - Administrator, Civil Service (Medical): No    Lack of Transportation (Non-Medical): No  Physical Activity: Insufficiently Active (06/23/2023)   Exercise Vital Sign    Days of Exercise per Week: 4 days    Minutes of Exercise per Session: 20 min  Stress: No Stress Concern Present (06/23/2023)   [employer] of Occupational Health - Occupational Stress Questionnaire    Feeling of Stress : Not at all  Social Connections: Moderately Integrated (06/23/2023)   Social Connection and Isolation Panel [NHANES]    Frequency of Communication with Friends and Family: More than three times a week    Frequency of Social Gatherings with Friends and Family: More than three times a week    Attends Religious Services: More than 4 times per year    Active Member of [employer] or Organizations: Yes    Attends Banker Meetings: More than 4 times per year    Marital Status: Widowed  Intimate Partner Violence: Not At Risk (06/23/2023)   Humiliation, Afraid, Rape, and Kick questionnaire    Fear of Current or Ex-Partner: No    Emotionally Abused: No    Physically Abused: No    Sexually Abused: No      Code Status   Code Status: Full Code  Review of Systems: Positive for hematuria . All systems reviewed and negative except where noted in HPI.  Physical Exam: Vital signs in last 24 hours: Temp:  [97.7 F (36.5 C)-98.2 F (36.8 C)] 97.7 F (36.5 C) (04/20 1558)  Pulse Rate:  [49-52] 49 (04/20 1600) Resp:  [13-22] 19 (04/20 1600) BP: (150-182)/(62-89) 164/70 (04/20 1600) SpO2:  [97 %-100 %] 100 % (04/20 1600) Weight:  [72.2 kg] 72.2 kg (04/20 1120)    General:  Pleasant female in NAD Psych:  Cooperative. Normal mood and affect Eyes: Pupils equal Ears:  Normal auditory acuity Nose: No deformity, discharge or lesions Neck:  Supple, no masses felt Lungs:  Clear to auscultation.  Heart:  Regular rate.  Abdomen:  Soft, nondistended, nontender, active bowel sounds, no masses felt Rectal :  Deferred Msk: Symmetrical without gross deformities.  Neurologic:  Alert, oriented, grossly normal neurologically Extremities : No edema Skin:  Intact without significant lesions.    Intake/Output from previous day: No intake/output data recorded. Intake/Output this shift:  No intake/output data recorded.   Mai Schwalbe, NP-C   02/13/2024, 4:15 PM

## 2024-02-13 NOTE — ED Triage Notes (Signed)
 Pt states she has blood in her urine. Pt states she as been peeing more than usual; however, she does take Lasix . Pt denies burning, N/V/D, and fever. Pt says she does have discomfort on her right flank side that started a couple nights ago.

## 2024-02-13 NOTE — Assessment & Plan Note (Addendum)
 No signs of acute exacerbation Plan to continue blood pressure control with losartan  and metoprolol  Hold on diuretic therapy  Because hematuria will hold on SGLT 2 inh for now.

## 2024-02-13 NOTE — ED Notes (Signed)
 Pt states she has been more tired than usual. She remembers sliding off the chair last week with the pillow.

## 2024-02-13 NOTE — Assessment & Plan Note (Signed)
 Continue gabapentin  and prednisone  She has not been on celecoxib  for many years.

## 2024-02-13 NOTE — Assessment & Plan Note (Addendum)
 Symptomatic anemia. Likely diverticular pain.   Old records personally reviewed, colonoscopy 2018 for similar symptoms of rectal bleeding and diarrhea, noted to have diverticulosis and internal hemorrhoids.  Plan for supportive medical therapy, follow H&H,  Hold PRBC transfusion for Hgb less than 7  Gentle hydration for today and clear liquid diet.  Continue pantoprazole  po.

## 2024-02-13 NOTE — Assessment & Plan Note (Signed)
 Calculated BMI 30,0

## 2024-02-13 NOTE — Discharge Instructions (Signed)
 Patient's daughter will take her to the emergency room by private car for further evaluation and treatment.

## 2024-02-14 DIAGNOSIS — K559 Vascular disorder of intestine, unspecified: Secondary | ICD-10-CM | POA: Diagnosis not present

## 2024-02-14 DIAGNOSIS — I10 Essential (primary) hypertension: Secondary | ICD-10-CM | POA: Diagnosis not present

## 2024-02-14 DIAGNOSIS — R531 Weakness: Secondary | ICD-10-CM | POA: Diagnosis not present

## 2024-02-14 DIAGNOSIS — I5032 Chronic diastolic (congestive) heart failure: Secondary | ICD-10-CM | POA: Diagnosis not present

## 2024-02-14 DIAGNOSIS — R197 Diarrhea, unspecified: Secondary | ICD-10-CM | POA: Diagnosis not present

## 2024-02-14 DIAGNOSIS — Z9889 Other specified postprocedural states: Secondary | ICD-10-CM | POA: Diagnosis not present

## 2024-02-14 DIAGNOSIS — K921 Melena: Secondary | ICD-10-CM | POA: Diagnosis not present

## 2024-02-14 DIAGNOSIS — K922 Gastrointestinal hemorrhage, unspecified: Secondary | ICD-10-CM | POA: Diagnosis not present

## 2024-02-14 DIAGNOSIS — K219 Gastro-esophageal reflux disease without esophagitis: Secondary | ICD-10-CM | POA: Diagnosis not present

## 2024-02-14 DIAGNOSIS — N184 Chronic kidney disease, stage 4 (severe): Secondary | ICD-10-CM | POA: Diagnosis not present

## 2024-02-14 DIAGNOSIS — E66811 Obesity, class 1: Secondary | ICD-10-CM | POA: Diagnosis not present

## 2024-02-14 LAB — CBC
HCT: 30.9 % — ABNORMAL LOW (ref 36.0–46.0)
Hemoglobin: 9.6 g/dL — ABNORMAL LOW (ref 12.0–15.0)
MCH: 32.7 pg (ref 26.0–34.0)
MCHC: 31.1 g/dL (ref 30.0–36.0)
MCV: 105.1 fL — ABNORMAL HIGH (ref 80.0–100.0)
Platelets: 198 10*3/uL (ref 150–400)
RBC: 2.94 MIL/uL — ABNORMAL LOW (ref 3.87–5.11)
RDW: 12.7 % (ref 11.5–15.5)
WBC: 6.2 10*3/uL (ref 4.0–10.5)
nRBC: 0 % (ref 0.0–0.2)

## 2024-02-14 LAB — URINALYSIS, ROUTINE W REFLEX MICROSCOPIC
Bilirubin Urine: NEGATIVE
Glucose, UA: 150 mg/dL — AB
Ketones, ur: NEGATIVE mg/dL
Leukocytes,Ua: NEGATIVE
Nitrite: NEGATIVE
Protein, ur: NEGATIVE mg/dL
Specific Gravity, Urine: 1.008 (ref 1.005–1.030)
pH: 8 (ref 5.0–8.0)

## 2024-02-14 LAB — FOLATE: Folate: >40 ng/mL (ref >5.9)

## 2024-02-14 LAB — URINE CULTURE

## 2024-02-14 LAB — GLUCOSE, CAPILLARY: Glucose-Capillary: 164 mg/dL — ABNORMAL HIGH (ref 70–99)

## 2024-02-14 LAB — BASIC METABOLIC PANEL WITH GFR
Anion gap: 12 (ref 5–15)
BUN: 59 mg/dL — ABNORMAL HIGH (ref 8–23)
CO2: 32 mmol/L (ref 22–32)
Calcium: 8.5 mg/dL — ABNORMAL LOW (ref 8.9–10.3)
Chloride: 94 mmol/L — ABNORMAL LOW (ref 98–111)
Creatinine, Ser: 2.4 mg/dL — ABNORMAL HIGH (ref 0.44–1.00)
GFR, Estimated: 19 mL/min — ABNORMAL LOW (ref >60)
Glucose, Bld: 96 mg/dL (ref 70–99)
Potassium: 3.7 mmol/L (ref 3.5–5.1)
Sodium: 138 mmol/L (ref 135–145)

## 2024-02-14 MED ORDER — OXYCODONE HCL 5 MG PO TABS
5.0000 mg | ORAL_TABLET | Freq: Once | ORAL | Status: AC | PRN
  Administered 2024-02-14: 5 mg via ORAL
  Filled 2024-02-14: qty 1

## 2024-02-14 NOTE — Plan of Care (Signed)
  Problem: Education: Goal: Knowledge of General Education information will improve Description: Including pain rating scale, medication(s)/side effects and non-pharmacologic comfort measures Outcome: Progressing   Problem: Clinical Measurements: Goal: Will remain free from infection Outcome: Progressing   Problem: Clinical Measurements: Goal: Diagnostic test results will improve Outcome: Progressing   Problem: Pain Managment: Goal: General experience of comfort will improve and/or be controlled Outcome: Progressing   Problem: Safety: Goal: Ability to remain free from injury will improve Outcome: Progressing

## 2024-02-14 NOTE — Progress Notes (Signed)
 [facility] night cross cover note:   Oxycodone  IR 5 mg po x 1 dose prn ordered for patient's RLQ abd pain.     Camelia Cavalier, DO Hospitalist

## 2024-02-14 NOTE — Progress Notes (Addendum)
 Progress Note   Patient: [name] ZOX:[id number] DOB: 1936-05-26 DOA: 02/13/2024     0 DOS: the patient was seen and examined on 02/14/2024   Brief hospital course: [name] was admitted to the hospital with the working diagnosis of hematochezia.   88 y.o. female with medical history significant of hypertension, CKD, chronic anemia, GERD, and osteoarthritis who presentes with diarrhea and hematochezia.  Reports about 2 weeks of diarrhea, watery in consistency, persistent and worsening in nature, associated with decreased appetite and occasional colicky right lower quadrant abdominal pain. No nausea or vomiting.  On her initial physical examination her blood pressure was 182/89, HR 52, RR 13 and 02 saturation 100% ENT with mild pallor Cardiovascular with S1 and S2 present and regular with no gallops, rubs or murmurs No JVD Respiratory with no rales or wheezing, no rhonchi  Abdomen with no distention, non tender to deep palpation, no rebound or guarding, No lower extremity edema, compression socks in place    Na 133. K 4.3 Cl 90, bicarbonate 33, glucose 115 bun 67 cr 2,84  BNP 100.5  Wbc 7.6 hgb 10.5 plt 209  Urine analysis SG 1,010 with negative nitrates, glucose 500 and large rbc.  Fecal occult stool positive    Chest radiograph with hypoinflation with no infiltrates or effusions. Mild cardiomegaly.    CT abdomen and pelvis with no acute findings, colonic diverticulosis, bilateral hyperdense renal cortical lesions, largest 3,8 cm left upper pole  04/21 patient was placed on supportive medical therapy, including IV fluids, clear liquid diet and antiacids with improvement on her symptoms. Pending stool studies, for now holding on antibiotic therapy.   Assessment and Plan: * Acute lower GI bleeding Symptomatic anemia. Likely diverticular bleed, to rule out infectious colitis    Old records personally reviewed, colonoscopy 2018 for similar symptoms of rectal bleeding and  diarrhea, noted to have diverticulosis and internal hemorrhoids.  Plan for supportive medical therapy, follow H&H,  Hold PRBC transfusion for Hgb less than 7   Continue pantoprazole  po.  Advance diet to soft and discontinue IV fluids.  Follow up on stool studies.   Essential hypertension Continue blood pressure control with amlodipine , metoprolol  and losartan    CKD (chronic kidney disease) stage 4, GFR 15-29 ml/min (HCC) AKI with base serum cr 2,3. Hyponatremia.   Likely due to hypovolemia.   Renal function with serum cr at 2,4 with K at 3,7 and serum bicarbonate at 32  Na 138  Continue to hold on loop diuretic.  Follow up renal function in am, avoid hypotension and nephrotoxic medications. Hold IV fluids and advance diet.   Chronic diastolic CHF (congestive heart failure) (HCC) No signs of acute exacerbation Plan to continue blood pressure control with losartan  and metoprolol  Hold on diuretic therapy  Because hematuria will hold on SGLT 2 inh for now.   Gastroesophageal reflux disease Continue with pantoprazole .  S/P lumbar laminectomy Continue gabapentin  and prednisone  She has not been on celecoxib  for many years.   Obesity, class 1 Calculated BMI 30,0      Subjective: patient had no further diarrhea, no hematochezia, no abdominal pain, no dyspnea or chest pain, tolerating well clear liquid diet.   Physical Exam: Vitals:   02/13/24 1907 02/14/24 0458 02/14/24 0750 02/14/24 0942  BP: (!) 168/63 117/81 (!) 146/66 99/68  Pulse: 63  (!) 48 (!) 50  Resp: 16 17 18 18   Temp: 98.3 F (36.8 C) 98.4 F (36.9 C) 98 F (36.7 C)   TempSrc:  Oral Oral Oral   SpO2: 100% 94% 95%   Weight:      Height:       Neurology awake and alert ENT with mild pallor with no icterus Cardiovascular with S1 and S2 present and regular with no gallops, rubs or murmurs Respiratory with no rales or rhonchi, mild expiratory wheezing Abdomen is soft and non distended non tender to palpation   Trace lower extremity edema  Data Reviewed:    Family Communication: no family at the bedside.  I spoke with patient's daughter over the phone, we talked in detail about patient's condition, plan of care and prognosis and all questions were addressed.   Disposition: Status is: Observation The patient remains OBS appropriate and will d/c before 2 midnights.  Planned Discharge Destination: Home     Author: Albertus Alt, MD 02/14/2024 12:01 PM  For on call review [email or web address].

## 2024-02-14 NOTE — Progress Notes (Signed)
 [facility] ROUNDING NOTE   Subjective: No further rectal bleeding, she did not have any bowel movements since last night.  Overall feels better Denies any abdominal pain, nausea or vomiting.  She is tolerating clear liquid diet.   Objective: Vital signs in last 24 hours: Temp:  [97.7 F (36.5 C)-98.4 F (36.9 C)] 98 F (36.7 C) (04/21 0750) Pulse Rate:  [48-63] 50 (04/21 0942) Resp:  [13-19] 18 (04/21 0750) BP: (99-182)/(63-89) 99/68 (04/21 0942) SpO2:  [94 %-100 %] 95 % (04/21 0750) Weight:  [72.2 kg] 72.2 kg (04/20 1120) Last BM Date : 02/13/24 General: NAD Abdomen: Soft, no distention or no tenderness, bowel sounds+     Intake/Output from previous day: 04/20 0701 - 04/21 0700 In: 180 [P.O.:180] Out: 350 [Urine:350] Intake/Output this shift: No intake/output data recorded.   Lab Results: Recent Labs    02/13/24 1123 02/13/24 1953 02/14/24 0638  WBC 7.6 6.4 6.2  HGB 10.5* 10.8* 9.6*  PLT 209 221 198  MCV 107.1* 104.6* 105.1*   BMET Recent Labs    02/13/24 1123 02/13/24 1953 02/14/24 0638  NA 133*  --  138  K 4.3  --  3.7  CL 90*  --  94*  CO2 33*  --  32  GLUCOSE 115*  --  96  BUN 67*  --  59*  CREATININE 2.84* 2.61* 2.40*  CALCIUM 8.9  --  8.5*   LFT Recent Labs    02/13/24 1123  PROT 6.2*  ALBUMIN 3.2*  AST 23  ALT 20  ALKPHOS 48  BILITOT 0.5   PT/INR No results for input(s): "INR" in the last 72 hours.    Imaging/Other results: CT Head Wo Contrast Result Date: 02/13/2024 CLINICAL DATA:  Mental status change EXAM: CT HEAD WITHOUT CONTRAST TECHNIQUE: Contiguous axial images were obtained from the base of the skull through the vertex without intravenous contrast. RADIATION DOSE REDUCTION: This exam was performed according to the departmental dose-optimization program which includes automated exposure control, adjustment of the mA and/or kV according to patient size and/or use of iterative reconstruction technique.  COMPARISON:  CT and MRI 07/27/2021 FINDINGS: Brain: No intracranial hemorrhage, mass effect, or evidence of acute infarct. No hydrocephalus. No extra-axial fluid collection. Age-commensurate cerebral atrophy and chronic small vessel ischemic disease. Vascular: No hyperdense vessel. Intracranial arterial calcification. Skull: No fracture or focal lesion. Sinuses/Orbits: No acute finding. Other: None. IMPRESSION: No acute intracranial abnormality. Electronically Signed   By: Rozell Cornet M.D.   On: 02/13/2024 17:06   CT ABDOMEN PELVIS WO CONTRAST Result Date: 02/13/2024 CLINICAL DATA:  Abdominal pain, acute, nonlocalized hematuria, r flank pain, hematochezia EXAM: CT ABDOMEN AND PELVIS WITHOUT CONTRAST TECHNIQUE: Multidetector CT imaging of the abdomen and pelvis was performed following the standard protocol without IV contrast. RADIATION DOSE REDUCTION: This exam was performed according to the departmental dose-optimization program which includes automated exposure control, adjustment of the mA and/or kV according to patient size and/or use of iterative reconstruction technique. COMPARISON:  MR 12/10/2022 FINDINGS: Lower chest: No pleural or pericardial effusion. Geographic ground-glass opacities in the lung bases, of indeterminate acuity. Hepatobiliary: No focal liver abnormality is seen. No gallstones, gallbladder wall thickening, or biliary dilatation. Pancreas: Unremarkable. No pancreatic ductal dilatation or surrounding inflammatory changes. Spleen: Normal in size without focal abnormality. Adrenals/Urinary Tract: No adrenal mass. No hydronephrosis or urolithiasis. Bilateral hyperdense renal cortical lesions, largest 3.8 cm left upper pole, as described on previous MR. Urinary bladder physiologically distended. Stomach/Bowel: Small hiatal hernia. Stomach  is decompressed, without acute finding. Small bowel is nondilated. Appendix not discretely identified. No pericecal inflammatory/edematous change. The  colon is partially distended, with scattered diverticula throughout, without adjacent inflammatory change. Vascular/Lymphatic: Mild scattered aortoiliac calcified plaque without aneurysm. No abdominal or pelvic adenopathy. Reproductive: Status post hysterectomy. No adnexal masses. Other: Bilateral pelvic phleboliths.  No ascites.  No free air. Musculoskeletal: Lumbar levoscoliosis with multilevel spondylitic change. Bilateral sacroiliitis left greater than right. IMPRESSION: 1. No acute findings. 2. Colonic diverticulosis. 3. Bilateral hyperdense renal cortical lesions, largest 3.8 cm left upper pole, as described on previous MR. 4. Geographic ground-glass opacities in the lung bases, of indeterminate acuity. 5.  Aortic Atherosclerosis (ICD10-I70.0). Electronically Signed   By: Nicoletta Barrier M.D.   On: 02/13/2024 16:06   DG Chest 1 View Result Date: 02/13/2024 CLINICAL DATA:  Wheezing. EXAM: CHEST  1 VIEW COMPARISON:  Chest radiographs 01/12/2024 and 08/02/2023 FINDINGS: Mildly to moderately decreased lung volumes, worsened from prior. Cardiac silhouette appears unchanged in size, likely the upper limits of normal for AP technique. Mediastinal contours are grossly unchanged and grossly within normal limits for AP technique. Mild calcification within the aortic arch. Mild bibasilar bronchovascular crowding due to low lung volumes. No acute airspace opacity. No pleural effusion pneumothorax. High-riding right humeral head again seen, suggesting a chronic superior rotator cuff tear. Moderate to severe bilateral glenohumeral osteoarthritis. IMPRESSION: Mildly to moderately decreased lung volumes, worsened from prior. No acute airspace opacity. Electronically Signed   By: Bertina Broccoli M.D.   On: 02/13/2024 12:25      Assessment &Plan   88 year old female admitted with hematochezia and worsening chronic diarrhea She had an episode of acute severe diarrhea after she ate chili from [facility] 2 weeks ago and has been  having persistent bloody diarrhea since then Acute infectious colitis and ischemic colitis in the differential  Her symptoms are resolving Follow-up GI pathogen panel Will slowly advance diet as tolerated  If continues to have no further bleeding, will plan to hold off colonoscopy     K. Patrick Boor , MD [phone]  [facility]

## 2024-02-14 NOTE — Telephone Encounter (Signed)
 Pt currently hospitalized.

## 2024-02-14 NOTE — Progress Notes (Signed)
 Mobility Specialist: Progress Note   02/14/24 1543  Mobility  Activity Ambulated with assistance in hallway  Level of Assistance Standby assist, set-up cues, supervision of patient - no hands on  Assistive Device Front wheel walker  Distance Ambulated (ft) 90 ft  Activity Response Tolerated well  Mobility Referral Yes  Mobility visit 1 Mobility  Mobility Specialist Start Time (ACUTE ONLY) 1421  Mobility Specialist Stop Time (ACUTE ONLY) 1445  Mobility Specialist Time Calculation (min) (ACUTE ONLY) 24 min    Pt was agreeable to mobility session - received in chair. SV throughout. Midway through ambulation felt dizzy and fatigued - took 1x standing break. Returned to room without fault. Ambulated to the BR as well - pericare done ind. Left in bed with all needs met, call bell in reach. Bed alarm on.   Deloria Fetch Mobility Specialist Please contact via SecureChat or Rehab office at [phone]

## 2024-02-14 NOTE — Evaluation (Signed)
 Physical Therapy Brief Evaluation and Discharge Note Patient Details Name: [name] MRN: [id number] DOB: February 17, 1936 Today's Date: 02/14/2024   History of Present Illness  88 y.o. female admitted 02/13/24 with diarrhea, hematochezia, GIB. PMHx: HTN, CKD, chronic anemia, GERD, OA, CHF, vertigo, frozen shoulder  Clinical Impression  Pt very pleasant and eager to get OOB. Pt with incontinent stool with assist for pericare. Pt lives with daughter in single story home and daughter assists with ADLs and IADLs at baseline. Pt walks with rollator and does not have stairs to enter home. Pt able to walk in hall, toilet and get up to chair at baseline functional level. No futher therapy required at this time with pt encouraged to get up to bathroom and walk daily acutely. Will sign off with pt aware and agreeable.        PT Assessment Patient does not need any further PT services  Assistance Needed at Discharge  Set up Supervision/Assistance    Equipment Recommendations None recommended by PT  Recommendations for Other Services       Precautions/Restrictions Precautions Precautions: Fall Recall of Precautions/Restrictions: Intact        Mobility  Bed Mobility Rolling: Modified independent (Device/Increase time) Supine/Sidelying to sit: Modified independent (Device/Increased time)   General bed mobility comments: pt able to transition to EOB without physical assist  Transfers Overall transfer level: Needs assistance   Transfers: Sit to/from Stand Sit to Stand: Contact guard assist           General transfer comment: cues for hand placement to rise from bed with RW present and with use of rail at toilet    Ambulation/Gait Ambulation/Gait assistance: Supervision Gait Distance (Feet): 100 Feet Assistive device: Rolling walker (2 wheels) Gait Pattern/deviations: Step-through pattern, Decreased stride length, Trunk flexed Gait Speed: Pace WFL General Gait Details: pt  with steady gait, able to self-direct distance and good control of RW  Home Activity Instructions    Stairs            Modified Rankin (Stroke Patients Only)        Balance Overall balance assessment: Mild deficits observed, not formally tested Sitting-balance support: No upper extremity supported Sitting balance-Leahy Scale: Good Sitting balance - Comments: able to sit EOB and toilet without assist   Standing balance support: Bilateral upper extremity supported, Reliant on assistive device for balance, During functional activity Standing balance-Leahy Scale: Poor Standing balance comment: RW for gait          Pertinent Vitals/Pain PT - Brief Vital Signs All Vital Signs Stable: Yes Pain Assessment Pain Assessment: No/denies pain     Home Living Family/patient expects to be discharged to:: Private residence Living Arrangements: Children Available Help at Discharge: Family;Available 24 hours/day Home Environment: Level entry   Home Equipment: Rollator (4 wheels);Tub bench        Prior Function Level of Independence: Needs assistance Comments: pt walks with rollator in home and at church. Daughter assists with bathing and dressing. pt still cooks and daughter does the cleaning/driving    UE/LE Assessment   UE ROM/Strength/Tone/Coordination: Generalized weakness    LE ROM/Strength/Tone/Coordination: Generalized weakness      Communication   Communication Communication: Impaired Factors Affecting Communication: Hearing impaired;Other (comment)     Cognition Overall Cognitive Status: Appears within functional limits for tasks assessed/performed       General Comments      Exercises     Assessment/Plan    PT Problem List  PT Visit Diagnosis Other abnormalities of gait and mobility (R26.89)    No Skilled PT Patient will have necessary level of assist by caregiver at discharge;All education completed   Co-evaluation                 AMPAC 6 Clicks Help needed turning from your back to your side while in a flat bed without using bedrails?: None Help needed moving from lying on your back to sitting on the side of a flat bed without using bedrails?: None Help needed moving to and from a bed to a chair (including a wheelchair)?: A Little Help needed standing up from a chair using your arms (e.g., wheelchair or bedside chair)?: A Little Help needed to walk in hospital room?: A Little Help needed climbing 3-5 steps with a railing? : A Little 6 Click Score: 20      End of Session   Activity Tolerance: Patient tolerated treatment well Patient left: in chair;with call bell/phone within reach;with chair alarm set Nurse Communication: Mobility status PT Visit Diagnosis: Other abnormalities of gait and mobility (R26.89)     Time: 1610-9604 PT Time Calculation (min) (ACUTE ONLY): 30 min  Charges:   PT Evaluation $PT Eval Low Complexity: 1 Low PT Treatments $Therapeutic Activity: 8-22 mins    Annis Baseman, PT Acute Rehabilitation Services Office: [phone]   [name]  02/14/2024, 11:05 AM

## 2024-02-14 NOTE — Progress Notes (Signed)
 CCMD called to notify me that patient has been sustaining a heart rate between 45-47. MD has been made aware.

## 2024-02-14 NOTE — Hospital Course (Signed)
 Mrs. Corcino was admitted to the hospital with the working diagnosis of hematochezia.   88 y.o. female with medical history significant of hypertension, CKD, chronic anemia, GERD, and osteoarthritis who presentes with diarrhea and hematochezia.  Reports about 2 weeks of diarrhea, watery in consistency, persistent and worsening in nature, associated with decreased appetite and occasional colicky right lower quadrant abdominal pain. No nausea or vomiting.  On her initial physical examination her blood pressure was 182/89, HR 52, RR 13 and 02 saturation 100% ENT with mild pallor Cardiovascular with S1 and S2 present and regular with no gallops, rubs or murmurs No JVD Respiratory with no rales or wheezing, no rhonchi  Abdomen with no distention, non tender to deep palpation, no rebound or guarding, No lower extremity edema, compression socks in place    Na 133. K 4.3 Cl 90, bicarbonate 33, glucose 115 bun 67 cr 2,84  BNP 100.5  Wbc 7.6 hgb 10.5 plt 209  Urine analysis SG 1,010 with negative nitrates, glucose 500 and large rbc.  Fecal occult stool positive    Chest radiograph with hypoinflation with no infiltrates or effusions. Mild cardiomegaly.    CT abdomen and pelvis with no acute findings, colonic diverticulosis, bilateral hyperdense renal cortical lesions, largest 3,8 cm left upper pole  04/21 patient was placed on supportive medical therapy, including IV fluids, clear liquid diet and antiacids with improvement on her symptoms. Pending stool studies, for now holding on antibiotic therapy.

## 2024-02-14 NOTE — Progress Notes (Signed)
 OT Cancellation Note  Patient Details Name: [name] MRN: [id number] DOB: October 08, 1936   Cancelled Treatment:    Reason Eval/Treat Not Completed: OT screened, no needs identified, will sign off (per PT pt with assist at home, no acute OT needs at this stime. OT will screen. Please reconsult if there is a change in pt status.)  [name], OTD, OTR/L SecureChat Preferred Acute Rehab [phone]   Antionette Kirks 02/14/2024, 8:50 AM

## 2024-02-15 DIAGNOSIS — K921 Melena: Secondary | ICD-10-CM

## 2024-02-15 DIAGNOSIS — R197 Diarrhea, unspecified: Secondary | ICD-10-CM | POA: Diagnosis not present

## 2024-02-15 DIAGNOSIS — R531 Weakness: Secondary | ICD-10-CM | POA: Diagnosis not present

## 2024-02-15 DIAGNOSIS — K559 Vascular disorder of intestine, unspecified: Secondary | ICD-10-CM | POA: Diagnosis not present

## 2024-02-15 DIAGNOSIS — K922 Gastrointestinal hemorrhage, unspecified: Secondary | ICD-10-CM | POA: Diagnosis not present

## 2024-02-15 DIAGNOSIS — I10 Essential (primary) hypertension: Secondary | ICD-10-CM | POA: Diagnosis not present

## 2024-02-15 LAB — CBC
HCT: 30 % — ABNORMAL LOW (ref 36.0–46.0)
Hemoglobin: 9.4 g/dL — ABNORMAL LOW (ref 12.0–15.0)
MCH: 33.6 pg (ref 26.0–34.0)
MCHC: 31.3 g/dL (ref 30.0–36.0)
MCV: 107.1 fL — ABNORMAL HIGH (ref 80.0–100.0)
Platelets: 211 10*3/uL (ref 150–400)
RBC: 2.8 MIL/uL — ABNORMAL LOW (ref 3.87–5.11)
RDW: 13 % (ref 11.5–15.5)
WBC: 7.2 10*3/uL (ref 4.0–10.5)
nRBC: 0 % (ref 0.0–0.2)

## 2024-02-15 LAB — BASIC METABOLIC PANEL WITH GFR
Anion gap: 10 (ref 5–15)
BUN: 55 mg/dL — ABNORMAL HIGH (ref 8–23)
CO2: 31 mmol/L (ref 22–32)
Calcium: 8.4 mg/dL — ABNORMAL LOW (ref 8.9–10.3)
Chloride: 94 mmol/L — ABNORMAL LOW (ref 98–111)
Creatinine, Ser: 2.6 mg/dL — ABNORMAL HIGH (ref 0.44–1.00)
GFR, Estimated: 17 mL/min — ABNORMAL LOW (ref >60)
Glucose, Bld: 100 mg/dL — ABNORMAL HIGH (ref 70–99)
Potassium: 3.9 mmol/L (ref 3.5–5.1)
Sodium: 135 mmol/L (ref 135–145)

## 2024-02-15 LAB — GLUCOSE, CAPILLARY: Glucose-Capillary: 105 mg/dL — ABNORMAL HIGH (ref 70–99)

## 2024-02-15 NOTE — Progress Notes (Signed)
 Mobility Specialist: Progress Note   02/15/24 1629  Mobility  Activity Ambulated with assistance in hallway  Level of Assistance Standby assist, set-up cues, supervision of patient - no hands on  Assistive Device Front wheel walker  Distance Ambulated (ft) 200 ft  Activity Response Tolerated well  Mobility Referral Yes  Mobility visit 1 Mobility  Mobility Specialist Start Time (ACUTE ONLY) 1520  Mobility Specialist Stop Time (ACUTE ONLY) 1546  Mobility Specialist Time Calculation (min) (ACUTE ONLY) 26 min    Pt was agreeable to mobility session - received in bed. A little fatigued. SV throughout. Took 3x standing breaks d/t fatigue. SpO2 WFL throughout. Returned to room without fault. Left in bed with all needs met, call bell in reach. Daughter present.   Deloria Fetch Mobility Specialist Please contact via SecureChat or Rehab office at [phone]

## 2024-02-15 NOTE — Plan of Care (Signed)

## 2024-02-15 NOTE — Progress Notes (Addendum)
 Pt had period of confusion around 0500. Pt found urinating on floor and asking where the bathroom was while coming out of her room. Pt was helped to bathroom. Pt was cleaned up, new socks and helped back into bed. Floor was clear of obstacles. Pt assessed and disorented x3 to situation, place and time. Could not remember how she got her, where she was, or who care team was. AxOx4 at beginning of shift. This Nurse called daughter on pt cell phone and she was able to calm pt. Pt very pleasant, redirectable and appreciative of care. Pt left in room call light within reach, bed alarm on, door open. Pt in NAD, RRWDL.  pt now AxOx4 and states its coming back to her and that she just got turned around. Care ongoing.

## 2024-02-15 NOTE — Discharge Summary (Signed)
 Physician Discharge Summary   Patient: [name] MRN: [id number] DOB: 01/02/1936  Admit date:     02/13/2024  Discharge date: 02/15/24  Discharge Physician: Jodeane Mulligan   PCP: Roslyn Coombe, MD   Recommendations at discharge:   At this time patient will be discharged home.  If you experience any symptoms such as fever, vomiting, shortness of breath, chest pain, abdominal pain, or other concerning symptoms, please call your primary care provider or go to the emergency department immediately.  Discharge Diagnoses: Principal Problem:   Acute lower GI bleeding Active Problems:   Essential hypertension   CKD (chronic kidney disease) stage 4, GFR 15-29 ml/min (HCC)   Chronic diastolic CHF (congestive heart failure) (HCC)   Gastroesophageal reflux disease   S/P lumbar laminectomy   Obesity, class 1  Resolved Problems:   * No resolved hospital problems. [name] Course: [name] was admitted to the hospital with the working diagnosis of hematochezia.   88 y.o. female with medical history significant of hypertension, CKD, chronic anemia, GERD, and osteoarthritis who presentes with diarrhea and hematochezia.  Reports about 2 weeks of diarrhea, watery in consistency, persistent and worsening in nature, associated with decreased appetite and occasional colicky right lower quadrant abdominal pain. No nausea or vomiting.  On her initial physical examination her blood pressure was 182/89, HR 52, RR 13 and 02 saturation 100% ENT with mild pallor Cardiovascular with S1 and S2 present and regular with no gallops, rubs or murmurs No JVD Respiratory with no rales or wheezing, no rhonchi  Abdomen with no distention, non tender to deep palpation, no rebound or guarding, No lower extremity edema, compression socks in place    Na 133. K 4.3 Cl 90, bicarbonate 33, glucose 115 bun 67 cr 2,84  BNP 100.5  Wbc 7.6 hgb 10.5 plt 209  Urine analysis SG 1,010 with negative nitrates,  glucose 500 and large rbc.  Fecal occult stool positive    Chest radiograph with hypoinflation with no infiltrates or effusions. Mild cardiomegaly.    CT abdomen and pelvis with no acute findings, colonic diverticulosis, bilateral hyperdense renal cortical lesions, largest 3,8 cm left upper pole  04/21 patient was placed on supportive medical therapy, including IV fluids, clear liquid diet and antiacids with improvement on her symptoms. Pending stool studies, for now holding on antibiotic therapy.   Assessment and Plan:  Acute lower GI bleeding Symptomatic anemia. -Likely diverticular bleed, to rule out infectious colitis.  Old records personally reviewed, colonoscopy 2018 for similar symptoms of rectal bleeding and diarrhea, noted to have diverticulosis and internal hemorrhoids.  Plan for supportive medical therapy, follow H&H,  Tolerated advancement in diet.  Appears to be resolved.  Essential hypertension -Blood medication regimen  Acute kidney injury on CKD 4 - Holding ACE/ARB.  Likely exacerbated with GI output.  Showed improvement after IVF.    Chronic diastolic CHF (congestive heart failure) -No signs of acute exacerbation.  Resume home medication regimen.        Consultants: GI Procedures performed: none  Disposition: Home Diet recommendation:  Discharge Diet Orders (From admission, onward)     Start     Ordered   02/15/24 0000  Diet - low sodium heart healthy        02/15/24 1516           Cardiac and Carb modified diet DISCHARGE MEDICATION: Allergies as of 02/15/2024       Reactions   Hydrocodone  Itching   Hydrocodone -acetaminophen   Other (See Comments)   Lasix  [furosemide ] Other (See Comments)   Dizziness.    Tizanidine  Other (See Comments)   Dizzy and fall   Tizanidine  Hcl Other (See Comments)   Iron  Hives, Other (See Comments)    bad constipation        Medication List     STOP taking these medications    allopurinol  100 MG  tablet Commonly known as: ZYLOPRIM    celecoxib  100 MG capsule Commonly known as: CELEBREX    diphenoxylate -atropine  2.5-0.025 MG tablet Commonly known as: Lomotil    febuxostat  40 MG tablet Commonly known as: ULORIC    fluticasone  50 MCG/ACT nasal spray Commonly known as: FLONASE   lidocaine  5 % Commonly known as: Lidoderm    losartan  100 MG tablet Commonly known as: COZAAR    triamcinolone  cream 0.1 % Commonly known as: KENALOG        TAKE these medications    albuterol  108 (90 Base) MCG/ACT inhaler Commonly known as: VENTOLIN  HFA Inhale 1 puff into the lungs every 6 (six) hours as needed for wheezing or shortness of breath. What changed: Another medication with the same name was removed. Continue taking this medication, and follow the directions you see here.   amLODipine  5 MG tablet Commonly known as: NORVASC  TAKE 1 TABLET BY MOUTH EVERY DAY What changed: when to take this   aspirin  EC 81 MG tablet Take 81 mg by mouth every morning. Swallow whole.   dapagliflozin  propanediol 10 MG Tabs tablet Commonly known as: Farxiga  Take 1 tablet (10 mg total) by mouth daily before breakfast.   Depend Underwear Sm/Med Misc Use as directed four times per day   Ensure Take 1 Can by mouth 3 (three) times daily between meals. What changed:  when to take this additional instructions   Fluad Quadrivalent 0.5 ML injection Generic drug: influenza vaccine adjuvanted   fluticasone -salmeterol 500-50 MCG/ACT Aepb Commonly known as: Wixela Inhub Inhale 1 puff into the lungs in the morning and at bedtime.   furosemide  40 MG tablet Commonly known as: LASIX  Take 1 tablet (40 mg total) by mouth every Monday, Wednesday, and Friday.   gabapentin  300 MG capsule Commonly known as: NEURONTIN  TAKE 1 CAPSULE BY MOUTH THREE TIMES A DAY   hydrocortisone  2.5 % rectal cream Commonly known as: Anusol -HC Place 1 Application rectally 2 (two) times daily.   Magnesium  Glycinate 120 MG  Caps Take 2 tablets by mouth at bedtime.   meclizine  12.5 MG tablet Commonly known as: ANTIVERT  TAKE 1 TABLET BY MOUTH THREE TIMES A DAY AS NEEDED FOR DIZZINESS What changed: See the new instructions.   metoprolol  succinate 25 MG 24 hr tablet Commonly known as: TOPROL -XL TAKE 1 TABLET (25 MG TOTAL) BY MOUTH DAILY.   montelukast  10 MG tablet Commonly known as: SINGULAIR  TAKE 1 TABLET BY MOUTH EVERY DAY   multivitamin with minerals Tabs tablet Take 1 tablet by mouth daily. Centrum Silver   pantoprazole  40 MG tablet Commonly known as: PROTONIX  TAKE 1 TABLET BY MOUTH EVERY DAY   potassium chloride  10 MEQ tablet Commonly known as: KLOR-CON  TAKE 1 TABLET BY MOUTH EVERY DAY WHEN TAKING FUROSEMIDE  What changed: See the new instructions.   predniSONE  5 MG tablet Commonly known as: DELTASONE  Take 1 tablet (5 mg total) by mouth daily with breakfast. What changed: Another medication with the same name was removed. Continue taking this medication, and follow the directions you see here.   Spikevax syringe Generic drug: COVID-19 mRNA vaccine   Vitamin D3 20 MCG (800 UNIT)  Tabs Take 1 tablet by mouth daily.        Discharge Exam: Filed Weights   02/13/24 1120  Weight: 72.2 kg   GENERAL:  Alert, pleasant, no acute distress  HEENT:  EOMI CARDIOVASCULAR:  RRR, no murmurs appreciated RESPIRATORY:  Clear to auscultation, no wheezing, rales, or rhonchi GASTROINTESTINAL:  Soft, nontender, nondistended EXTREMITIES:  No LE edema bilaterally NEURO:  No new focal deficits appreciated SKIN:  No rashes noted PSYCH:  Appropriate mood and affect    Condition at discharge: improving  The results of significant diagnostics from this hospitalization (including imaging, microbiology, ancillary and laboratory) are listed below for reference.   Imaging Studies: CT Head Wo Contrast Result Date: 02/13/2024 CLINICAL DATA:  Mental status change EXAM: CT HEAD WITHOUT CONTRAST TECHNIQUE:  Contiguous axial images were obtained from the base of the skull through the vertex without intravenous contrast. RADIATION DOSE REDUCTION: This exam was performed according to the departmental dose-optimization program which includes automated exposure control, adjustment of the mA and/or kV according to patient size and/or use of iterative reconstruction technique. COMPARISON:  CT and MRI 07/27/2021 FINDINGS: Brain: No intracranial hemorrhage, mass effect, or evidence of acute infarct. No hydrocephalus. No extra-axial fluid collection. Age-commensurate cerebral atrophy and chronic small vessel ischemic disease. Vascular: No hyperdense vessel. Intracranial arterial calcification. Skull: No fracture or focal lesion. Sinuses/Orbits: No acute finding. Other: None. IMPRESSION: No acute intracranial abnormality. Electronically Signed   By: Rozell Cornet M.D.   On: 02/13/2024 17:06   CT ABDOMEN PELVIS WO CONTRAST Result Date: 02/13/2024 CLINICAL DATA:  Abdominal pain, acute, nonlocalized hematuria, r flank pain, hematochezia EXAM: CT ABDOMEN AND PELVIS WITHOUT CONTRAST TECHNIQUE: Multidetector CT imaging of the abdomen and pelvis was performed following the standard protocol without IV contrast. RADIATION DOSE REDUCTION: This exam was performed according to the departmental dose-optimization program which includes automated exposure control, adjustment of the mA and/or kV according to patient size and/or use of iterative reconstruction technique. COMPARISON:  MR 12/10/2022 FINDINGS: Lower chest: No pleural or pericardial effusion. Geographic ground-glass opacities in the lung bases, of indeterminate acuity. Hepatobiliary: No focal liver abnormality is seen. No gallstones, gallbladder wall thickening, or biliary dilatation. Pancreas: Unremarkable. No pancreatic ductal dilatation or surrounding inflammatory changes. Spleen: Normal in size without focal abnormality. Adrenals/Urinary Tract: No adrenal mass. No  hydronephrosis or urolithiasis. Bilateral hyperdense renal cortical lesions, largest 3.8 cm left upper pole, as described on previous MR. Urinary bladder physiologically distended. Stomach/Bowel: Small hiatal hernia. Stomach is decompressed, without acute finding. Small bowel is nondilated. Appendix not discretely identified. No pericecal inflammatory/edematous change. The colon is partially distended, with scattered diverticula throughout, without adjacent inflammatory change. Vascular/Lymphatic: Mild scattered aortoiliac calcified plaque without aneurysm. No abdominal or pelvic adenopathy. Reproductive: Status post hysterectomy. No adnexal masses. Other: Bilateral pelvic phleboliths.  No ascites.  No free air. Musculoskeletal: Lumbar levoscoliosis with multilevel spondylitic change. Bilateral sacroiliitis left greater than right. IMPRESSION: 1. No acute findings. 2. Colonic diverticulosis. 3. Bilateral hyperdense renal cortical lesions, largest 3.8 cm left upper pole, as described on previous MR. 4. Geographic ground-glass opacities in the lung bases, of indeterminate acuity. 5.  Aortic Atherosclerosis (ICD10-I70.0). Electronically Signed   By: Nicoletta Barrier M.D.   On: 02/13/2024 16:06   DG Chest 1 View Result Date: 02/13/2024 CLINICAL DATA:  Wheezing. EXAM: CHEST  1 VIEW COMPARISON:  Chest radiographs 01/12/2024 and 08/02/2023 FINDINGS: Mildly to moderately decreased lung volumes, worsened from prior. Cardiac silhouette appears unchanged in size, likely the upper  limits of normal for AP technique. Mediastinal contours are grossly unchanged and grossly within normal limits for AP technique. Mild calcification within the aortic arch. Mild bibasilar bronchovascular crowding due to low lung volumes. No acute airspace opacity. No pleural effusion pneumothorax. High-riding right humeral head again seen, suggesting a chronic superior rotator cuff tear. Moderate to severe bilateral glenohumeral osteoarthritis.  IMPRESSION: Mildly to moderately decreased lung volumes, worsened from prior. No acute airspace opacity. Electronically Signed   By: Bertina Broccoli M.D.   On: 02/13/2024 12:25    Microbiology: Results for orders placed or performed during the hospital encounter of 02/13/24  Urine Culture     Status: Abnormal   Collection Time: 02/13/24 11:11 AM   Specimen: Urine, Clean Catch  Result Value Ref Range Status   Specimen Description URINE, CLEAN CATCH  Final   Special Requests   Final    NONE Performed at [facility], [address] [place], [place] [address]    Culture MULTIPLE SPECIES PRESENT, SUGGEST RECOLLECTION (A)  Final   Report Status 02/14/2024 FINAL  Final    Labs: CBC: Recent Labs  Lab 02/13/24 1123 02/13/24 1953 02/14/24 0638 02/15/24 0434  WBC 7.6 6.4 6.2 7.2  NEUTROABS 5.2  --   --   --   HGB 10.5* 10.8* 9.6* 9.4*  HCT 33.3* 34.0* 30.9* 30.0*  MCV 107.1* 104.6* 105.1* 107.1*  PLT 209 221 198 211   Basic Metabolic Panel: Recent Labs  Lab 02/13/24 1123 02/13/24 1953 02/14/24 0638 02/15/24 0434  NA 133*  --  138 135  K 4.3  --  3.7 3.9  CL 90*  --  94* 94*  CO2 33*  --  32 31  GLUCOSE 115*  --  96 100*  BUN 67*  --  59* 55*  CREATININE 2.84* 2.61* 2.40* 2.60*  CALCIUM 8.9  --  8.5* 8.4*   Liver Function Tests: Recent Labs  Lab 02/13/24 1123  AST 23  ALT 20  ALKPHOS 48  BILITOT 0.5  PROT 6.2*  ALBUMIN 3.2*   CBG: Recent Labs  Lab 02/14/24 2033 02/15/24 0006  GLUCAP 164* 105*    Discharge time spent: greater than 30 minutes.  Signed: Jodeane Mulligan, DO [facility] 02/15/2024

## 2024-02-15 NOTE — Progress Notes (Signed)
 Mobility Specialist: Progress Note   02/15/24 1244  Mobility  Activity Ambulated with assistance in hallway  Level of Assistance Standby assist, set-up cues, supervision of patient - no hands on  Assistive Device Front wheel walker  Distance Ambulated (ft) 200 ft  Activity Response Tolerated well  Mobility Referral Yes  Mobility visit 1 Mobility  Mobility Specialist Start Time (ACUTE ONLY) 1100  Mobility Specialist Stop Time (ACUTE ONLY) 1115  Mobility Specialist Time Calculation (min) (ACUTE ONLY) 15 min    Pt very pleasant and agreeable to mobility session - received in bed. Daughter present and helpful. Compression stockings on pt upon arrival. SV throughout. C/o fatigue midway through ambulation. SpO2 WFL on RA. Took 1x standing break. Returned to room without fault. Left in bed with all needs met, call bell in reach.   Deloria Fetch Mobility Specialist Please contact via SecureChat or Rehab office at [phone]

## 2024-02-15 NOTE — Telephone Encounter (Signed)
 error

## 2024-02-15 NOTE — Progress Notes (Addendum)
 Patient ID: [name], female   DOB: 01-22-1936, 88 y.o.   MRN: [id number]    Progress Note   Subjective   Day # 3 CC;diarrhea and hematochezia  Patient was doing well when seen earlier this morning, no complaints of abdominal pain, no bowel movement or diarrhea, no further rectal bleeding. Eating solid food, good appetite happy to be eating   Path panel never collected as diarrhea stopped Labs today-WBC 7.2/hemoglobin 9.4/hematocrit 30 Potassium 3.9/BUN 55/creatinine 2.6 stable   Objective   Vital signs in last 24 hours: Temp:  [98.4 F (36.9 C)-99 F (37.2 C)] 98.8 F (37.1 C) (04/22 1555) Pulse Rate:  [52-56] 55 (04/22 1555) Resp:  [16-18] 17 (04/22 1555) BP: (86-151)/(45-79) 91/49 (04/22 1555) SpO2:  [90 %-98 %] 95 % (04/22 1555) Last BM Date : 02/14/24 General:    Elderly African-American female in NAD, hard of hearing, daughter at bedside Heart:  Regular rate and rhythm; no murmurs Lungs: Respirations even and unlabored, lungs CTA bilaterally Abdomen:  Soft, nontender and nondistended. Normal bowel sounds. Extremities:  Without edema. Neurologic:  Alert and oriented,  grossly normal neurologically. Psych:  Cooperative. Normal mood and affect.  Intake/Output from previous day: 04/21 0701 - 04/22 0700 In: -  Out: 600 [Urine:600] Intake/Output this shift: No intake/output data recorded.  Lab Results: Recent Labs    02/13/24 1953 02/14/24 0638 02/15/24 0434  WBC 6.4 6.2 7.2  HGB 10.8* 9.6* 9.4*  HCT 34.0* 30.9* 30.0*  PLT 221 198 211   BMET Recent Labs    02/13/24 1123 02/13/24 1953 02/14/24 0638 02/15/24 0434  NA 133*  --  138 135  K 4.3  --  3.7 3.9  CL 90*  --  94* 94*  CO2 33*  --  32 31  GLUCOSE 115*  --  96 100*  BUN 67*  --  59* 55*  CREATININE 2.84* 2.61* 2.40* 2.60*  CALCIUM 8.9  --  8.5* 8.4*   LFT Recent Labs    02/13/24 1123  PROT 6.2*  ALBUMIN 3.2*  AST 23  ALT 20  ALKPHOS 48  BILITOT 0.5   PT/INR No results for  input(s): "LABPROT", "INR" in the last 72 hours.    Assessment / Plan:    #63 88 year old female who was admitted with worsening of somewhat chronic diarrhea with new hematochezia. Suspicion for an infectious etiology after eating these chili about 2 weeks ago had onset of worsening diarrhea and some rectal bleeding.  Symptoms resolved rapidly after hospitalization, unable to collect GI path panel as diarrhea had resolved.  CT of the abdomen pelvis on admission showed no acute findings, colonic diverticulosis without otitis, no evidence of colitis  Patient is eating solid food, no complaints of abdominal pain Labs are stable  Plan; continue supportive management, No plans for colonoscopic evaluation, as symptoms have rapidly resolved and suspect she had an underlying infectious etiology. GI will sign off, available if needed    Principal Problem:   Acute lower GI bleeding Active Problems:   Essential hypertension   Gastroesophageal reflux disease   CKD (chronic kidney disease) stage 4, GFR 15-29 ml/min (HCC)   S/P lumbar laminectomy   Chronic diastolic CHF (congestive heart failure) (HCC)   Obesity, class 1     LOS: 0 days   Amy EsterwoodPA-C  02/15/2024, 5:20 PM   Attending physician's note   I personally saw the patient and performed a substantive portion of this encounter (>50% time spent), including a complete  performance of at least one of the key components (MDM, Hx and/or Exam), in conjunction with the APP.  I agree with the APP's note, impression, and recommendations with additional input as follows.    No further bleeding.  She is tolerating diet No plan for colonoscopy at this point GI signing off, please call with any questions  The patient was provided an opportunity to ask questions and all were answered. The patient agreed with the plan and demonstrated an understanding of the instructions.   Lorena Rolling , MD [phone]

## 2024-02-17 ENCOUNTER — Ambulatory Visit: Payer: Self-pay | Admitting: *Deleted

## 2024-02-17 ENCOUNTER — Emergency Department (HOSPITAL_COMMUNITY)
Admission: EM | Admit: 2024-02-17 | Discharge: 2024-02-17 | Disposition: A | Discharge status: Home / Self Care | Attending: Emergency Medicine | Admitting: Emergency Medicine

## 2024-02-17 ENCOUNTER — Other Ambulatory Visit: Payer: Self-pay

## 2024-02-17 DIAGNOSIS — N189 Chronic kidney disease, unspecified: Secondary | ICD-10-CM | POA: Diagnosis not present

## 2024-02-17 DIAGNOSIS — I130 Hypertensive heart and chronic kidney disease with heart failure and stage 1 through stage 4 chronic kidney disease, or unspecified chronic kidney disease: Secondary | ICD-10-CM | POA: Insufficient documentation

## 2024-02-17 DIAGNOSIS — K625 Hemorrhage of anus and rectum: Secondary | ICD-10-CM | POA: Diagnosis not present

## 2024-02-17 DIAGNOSIS — Z7984 Long term (current) use of oral hypoglycemic drugs: Secondary | ICD-10-CM | POA: Insufficient documentation

## 2024-02-17 DIAGNOSIS — R531 Weakness: Secondary | ICD-10-CM | POA: Insufficient documentation

## 2024-02-17 DIAGNOSIS — E7989 Other specified disorders of purine and pyrimidine metabolism: Secondary | ICD-10-CM | POA: Insufficient documentation

## 2024-02-17 DIAGNOSIS — Z79899 Other long term (current) drug therapy: Secondary | ICD-10-CM | POA: Insufficient documentation

## 2024-02-17 DIAGNOSIS — I509 Heart failure, unspecified: Secondary | ICD-10-CM | POA: Diagnosis not present

## 2024-02-17 DIAGNOSIS — Z7982 Long term (current) use of aspirin: Secondary | ICD-10-CM | POA: Insufficient documentation

## 2024-02-17 DIAGNOSIS — R001 Bradycardia, unspecified: Secondary | ICD-10-CM | POA: Diagnosis not present

## 2024-02-17 DIAGNOSIS — K921 Melena: Secondary | ICD-10-CM

## 2024-02-17 DIAGNOSIS — E860 Dehydration: Secondary | ICD-10-CM | POA: Insufficient documentation

## 2024-02-17 DIAGNOSIS — R58 Hemorrhage, not elsewhere classified: Secondary | ICD-10-CM | POA: Diagnosis not present

## 2024-02-17 LAB — COMPREHENSIVE METABOLIC PANEL WITH GFR
ALT: 21 U/L (ref 0–44)
AST: 23 U/L (ref 15–41)
Albumin: 3.2 g/dL — ABNORMAL LOW (ref 3.5–5.0)
Alkaline Phosphatase: 60 U/L (ref 38–126)
Anion gap: 8 (ref 5–15)
BUN: 73 mg/dL — ABNORMAL HIGH (ref 8–23)
CO2: 34 mmol/L — ABNORMAL HIGH (ref 22–32)
Calcium: 8.8 mg/dL — ABNORMAL LOW (ref 8.9–10.3)
Chloride: 91 mmol/L — ABNORMAL LOW (ref 98–111)
Creatinine, Ser: 3.16 mg/dL — ABNORMAL HIGH (ref 0.44–1.00)
GFR, Estimated: 14 mL/min — ABNORMAL LOW (ref >60)
Glucose, Bld: 136 mg/dL — ABNORMAL HIGH (ref 70–99)
Potassium: 4.8 mmol/L (ref 3.5–5.1)
Sodium: 133 mmol/L — ABNORMAL LOW (ref 135–145)
Total Bilirubin: 0.8 mg/dL (ref 0.0–1.2)
Total Protein: 6.2 g/dL — ABNORMAL LOW (ref 6.5–8.1)

## 2024-02-17 LAB — CBC
HCT: 33.2 % — ABNORMAL LOW (ref 36.0–46.0)
Hemoglobin: 10.4 g/dL — ABNORMAL LOW (ref 12.0–15.0)
MCH: 34 pg (ref 26.0–34.0)
MCHC: 31.3 g/dL (ref 30.0–36.0)
MCV: 108.5 fL — ABNORMAL HIGH (ref 80.0–100.0)
Platelets: 232 10*3/uL (ref 150–400)
RBC: 3.06 MIL/uL — ABNORMAL LOW (ref 3.87–5.11)
RDW: 13.1 % (ref 11.5–15.5)
WBC: 10.5 10*3/uL (ref 4.0–10.5)
nRBC: 0 % (ref 0.0–0.2)

## 2024-02-17 LAB — ABO/RH: ABO/RH(D): A POS

## 2024-02-17 LAB — TYPE AND SCREEN
ABO/RH(D): A POS
Antibody Screen: NEGATIVE

## 2024-02-17 LAB — POC OCCULT BLOOD, ED: Fecal Occult Bld: POSITIVE — AB

## 2024-02-17 LAB — BRAIN NATRIURETIC PEPTIDE: B Natriuretic Peptide: 184.7 pg/mL — ABNORMAL HIGH (ref 0.0–100.0)

## 2024-02-17 NOTE — ED Provider Notes (Signed)
 [place] EMERGENCY DEPARTMENT AT [facility] Provider Note   CSN: [id number] Arrival date & time: 02/17/24  1331     History Chief Complaint  Patient presents with   Rectal Bleeding    [name] is a 88 y.o. female.  Patient presents to the emergency department today with concerns of rectal bleeding.  Reports that she has been having bright red blood per rectum with bowel movements primarily noticed when wiping but also some in the toilet.  Was recently admitted to [facility] for similar concerns and had an hospital GI follow-up but no EGD or colonoscopy performed.   Rectal Bleeding      Home Medications Prior to Admission medications   Medication Sig Start Date End Date Taking? Authorizing Provider  albuterol  (VENTOLIN  HFA) 108 (90 Base) MCG/ACT inhaler Inhale 1 puff into the lungs every 6 (six) hours as needed for wheezing or shortness of breath. 08/24/23   Roslyn Coombe, MD  amLODipine  (NORVASC ) 5 MG tablet TAKE 1 TABLET BY MOUTH EVERY DAY Patient taking differently: Take 5 mg by mouth every morning. 10/08/20   Roslyn Coombe, MD  aspirin  EC 81 MG tablet Take 81 mg by mouth every morning. Swallow whole.    [provider]  Cholecalciferol (VITAMIN D3) 20 MCG (800 UNIT) TABS Take 1 tablet by mouth daily. 08/08/21   Verlyn Goad, MD  dapagliflozin  propanediol (FARXIGA ) 10 MG TABS tablet Take 1 tablet (10 mg total) by mouth daily before breakfast. 05/17/23   Roslyn Coombe, MD  Ensure (ENSURE) Take 1 Can by mouth 3 (three) times daily between meals. Patient taking differently: Take 237 mLs by mouth See admin instructions. Drink one can (237 mls) by mouth once or twice daily 05/23/19   Roslyn Coombe, MD  FLUAD QUADRIVALENT 0.5 ML injection  06/30/22   [provider]  fluticasone -salmeterol (WIXELA INHUB) 500-50 MCG/ACT AEPB Inhale 1 puff into the lungs in the morning and at bedtime. 06/25/23   Roslyn Coombe, MD  furosemide  (LASIX ) 40 MG tablet  Take 1 tablet (40 mg total) by mouth every Monday, Wednesday, and Friday. 08/08/21   Verlyn Goad, MD  gabapentin  (NEURONTIN ) 300 MG capsule TAKE 1 CAPSULE BY MOUTH THREE TIMES A DAY 09/27/23   Roslyn Coombe, MD  hydrocortisone  (ANUSOL -HC) 2.5 % rectal cream Place 1 Application rectally 2 (two) times daily. 02/01/24   Roslyn Coombe, MD  Incontinence Supply Disposable (DEPEND UNDERWEAR SM/MED) MISC Use as directed four times per day 05/23/19   Roslyn Coombe, MD  Magnesium  Glycinate 120 MG CAPS Take 2 tablets by mouth at bedtime. 01/07/24   Raulkar, Keven Pel, MD  meclizine  (ANTIVERT ) 12.5 MG tablet TAKE 1 TABLET BY MOUTH THREE TIMES A DAY AS NEEDED FOR DIZZINESS Patient taking differently: Take 12.5 mg by mouth 3 (three) times daily as needed for dizziness. 03/03/21   Roslyn Coombe, MD  metoprolol  succinate (TOPROL -XL) 25 MG 24 hr tablet TAKE 1 TABLET (25 MG TOTAL) BY MOUTH DAILY. 02/08/24   Roslyn Coombe, MD  montelukast  (SINGULAIR ) 10 MG tablet TAKE 1 TABLET BY MOUTH EVERY DAY 06/01/22   Roslyn Coombe, MD  Multiple Vitamin (MULTIVITAMIN WITH MINERALS) TABS tablet Take 1 tablet by mouth daily. Centrum Silver    [provider]  pantoprazole  (PROTONIX ) 40 MG tablet TAKE 1 TABLET BY MOUTH EVERY DAY 11/23/22   Roslyn Coombe, MD  potassium chloride  (KLOR-CON ) 10 MEQ tablet TAKE 1 TABLET  BY MOUTH EVERY DAY WHEN TAKING FUROSEMIDE  Patient taking differently: Take 10 mEq by mouth every Monday, Wednesday, and Friday. 06/07/21   Roslyn Coombe, MD  predniSONE  (DELTASONE ) 5 MG tablet Take 1 tablet (5 mg total) by mouth daily with breakfast. 02/10/24   Raulkar, Keven Pel, MD  [facility] syringe  08/03/22   [provider]      Allergies    Hydrocodone , Hydrocodone -acetaminophen , Lasix  [furosemide ], Tizanidine , Tizanidine  hcl, and Iron     Review of Systems   Review of Systems  Gastrointestinal:  Positive for blood in stool and hematochezia.  All other systems reviewed and are negative.   Physical  Exam Updated Vital Signs BP (!) 152/67 (BP Location: Right Arm)   Pulse (!) 55   Temp 98.5 F (36.9 C) (Oral)   Resp 14   Ht 5\' 1"  (1.549 m)   Wt 70.3 kg   SpO2 98%   BMI 29.29 kg/m  Physical Exam Vitals and nursing note reviewed. Exam conducted with a chaperone present.  Constitutional:      General: She is not in acute distress.    Appearance: She is well-developed.  HENT:     Head: Normocephalic and atraumatic.  Eyes:     Conjunctiva/sclera: Conjunctivae normal.  Cardiovascular:     Rate and Rhythm: Normal rate and regular rhythm.     Heart sounds: No murmur heard. Pulmonary:     Effort: Pulmonary effort is normal. No respiratory distress.     Breath sounds: Normal breath sounds.  Abdominal:     Palpations: Abdomen is soft.     Tenderness: There is no abdominal tenderness.  Genitourinary:    Rectum: Guaiac result positive. Tenderness present. No anal fissure, external hemorrhoid or internal hemorrhoid.     Comments: Gross blood present. No obvious external hemorrhoid. Some tenderness with exam at the anus suggesting possible internal hemorrhoid. Musculoskeletal:        General: No swelling.     Cervical back: Neck supple.  Skin:    General: Skin is warm and dry.     Capillary Refill: Capillary refill takes less than 2 seconds.  Neurological:     Mental Status: She is alert.  Psychiatric:        Mood and Affect: Mood normal.     ED Results / Procedures / Treatments   Labs (all labs ordered are listed, but only abnormal results are displayed) Labs Reviewed  COMPREHENSIVE METABOLIC PANEL WITH GFR - Abnormal; Notable for the following components:      Result Value   Sodium 133 (*)    Chloride 91 (*)    CO2 34 (*)    Glucose, Bld 136 (*)    BUN 73 (*)    Creatinine, Ser 3.16 (*)    Calcium 8.8 (*)    Total Protein 6.2 (*)    Albumin 3.2 (*)    GFR, Estimated 14 (*)    All other components within normal limits  CBC - Abnormal; Notable for the following  components:   RBC 3.06 (*)    Hemoglobin 10.4 (*)    HCT 33.2 (*)    MCV 108.5 (*)    All other components within normal limits  BRAIN NATRIURETIC PEPTIDE - Abnormal; Notable for the following components:   B Natriuretic Peptide 184.7 (*)    All other components within normal limits  POC OCCULT BLOOD, ED - Abnormal; Notable for the following components:   Fecal Occult Bld POSITIVE (*)    All  other components within normal limits  TYPE AND SCREEN  ABO/RH    EKG None  Radiology No results found.  Procedures Procedures    Medications Ordered in ED Medications - No data to display  ED Course/ Medical Decision Making/ A&P                                 Medical Decision Making Amount and/or Complexity of Data Reviewed Labs: ordered.   This patient presents to the ED for concern of rectal bleeding., this involves an extensive number of treatment options, and is a complaint that carries with it a high risk of complications and morbidity.  The differential diagnosis includes gastrointestinal bleeding, diarrheal illness, chronic anemia, gastric ulcer, hemorrhoid   Co morbidities that complicate the patient evaluation  CHF, hypertension, CKD, rheumatoid arthritis, obesity   Lab Tests:  I Ordered, and personally interpreted labs.  The pertinent results include: Hemoglobin stable with slight increase compared to prior up to 10.4, CMP with mild evidence of dehydration with slightly elevated creatinine but not AKI, BNP slightly bumped at 184.7 but not elevated enough to consider CHF exacerbation, Hemoccult positive   Consultations Obtained:  I requested consultation with the gastroenterology,  and discussed lab and imaging findings as well as pertinent plan - they recommend: close outpatient follow up. Can consult if patient ends up being admitted for other reasons but do not feel that emergent EGD or colonoscopy is needed with stable hemoglobin. Reginal Capra, GI PA with  [facility].   Problem List / ED Course / Critical interventions / Medication management  Patient presents to the emergency department today with concerns of rectal bleeding.  Reports that she was recently admitted to [facility] after presenting to the emergency department similar concerns.  States that she had resolution of symptoms while she was admitted but has had return of symptoms in the last day or so.  Has had baseline general weakness that has not improved.  No recent fever, chills, or bodyaches.  Not on blood thinners. Physical exam is unremarkable.  Patient otherwise well-appearing and rectal vault shows a small portion of congealed blood present.  No obvious hemorrhoid.  Some slight protrusion of the rectum is seen but no clear rectal prolapse. Basic labs were largely unremarkable.  Hemoccult was positive.  Will consult GI for recommendations. Spoke with on-call GI, Reginal Capra PA, who recommended outpatient follow-up.  Informed patient daughter recommendations are agreeable to this plan.  Will discharge home with close GI follow-up. I have reviewed the patients home medicines and have made adjustments as needed   Test / Admission - Considered:  Considered admission for possible GI bleed but after discussion with GI on-call, decided against admission given patient's hemoglobin has remained stable with no notable decline.  Will plan for outpatient follow-up.  Final Clinical Impression(s) / ED Diagnoses Final diagnoses:  Hematochezia  Weakness    Rx / DC Orders ED Discharge Orders          Ordered    Ambulatory referral to Gastroenterology        02/17/24 1812              Oretha Birch 02/17/24 1934    Dorenda Gandy, MD 02/18/24 [phone]

## 2024-02-17 NOTE — Telephone Encounter (Signed)
  Chief Complaint: rectal bleeding - mixed with stool Symptoms: return on rectal bleeding- missed with stool, fatigue, incontinent of stool Frequency: restarted today Pertinent Negatives: Patient denies abdomen pain, vomiting, dizziness, fever  Disposition: [x] ED /[] Urgent Care (no appt availability in office) / [] Appointment(In office/virtual)/ []  [place] [facility]/ [] Home Care/ [] Refused Recommended Disposition /[] [place] [facility]/ []  Follow-up with PCP Additional Notes: daughter advised- ED   Copied from CRM [id number]. Topic: Clinical - Red Word Triage >> Feb 17, 2024 12:01 PM Orien Bird wrote: [facility] that prompted transfer to Nurse Triage: Patient has been release from  hospital and patient woke up with swollen legs, feet, hands and arms and blood is in her poop. She was release on Tuesday evening, it appeared that the bleeding had stop but everything has started back. Reason for Disposition  [1] MODERATE rectal bleeding (small blood clots, passing blood without stool, or toilet water turns red) AND [2] more than once a day  Answer Assessment - Initial Assessment Questions 1. APPEARANCE of BLOOD: "What color is it?" "Is it passed separately, on the surface of the stool, or mixed in with the stool?"      Bright red 2. AMOUNT: "How much blood was passed?"      Large amount 3. FREQUENCY: "How many times has blood been passed with the stools?"      Started back today 4. ONSET: "When was the blood first seen in the stools?" (Days or weeks)      1 1/2 weeks- was seen at office for diarrhea, weakness  5. DIARRHEA: "Is there also some diarrhea?" If Yes, ask: "How many diarrhea stools in the past 24 hours?"      Not as loose as they were 6. CONSTIPATION: "Do you have constipation?" If Yes, ask: "How bad is it?"     no 7. RECURRENT SYMPTOMS: "Have you had blood in your stools before?" If Yes, ask: "When was the last time?" and "What happened that time?"      Yes-  discharged Tuesday 8. BLOOD THINNERS: "Do you take any blood thinners?" (e.g., Coumadin/warfarin, Pradaxa/dabigatran, aspirin )     no 9. OTHER SYMPTOMS: "Do you have any other symptoms?"  (e.g., abdomen pain, vomiting, dizziness, fever)     weakness  Protocols used: Rectal Bleeding-A-AH

## 2024-02-17 NOTE — ED Provider Triage Note (Signed)
 Emergency Medicine Provider Triage Evaluation Note  [name] , a 88 y.o. female  was evaluated in triage.  Pt complains of rectal bleeding. She reports noticing bright red blood on her toilet paper earlier today. Denies pain with bowel movements or increased straining. She does report taking aspirin  daily but no other blood thinners. No history of hemorrhoids that she can recall. Denies any specific feelings of weakness or dizziness.  Review of Systems  Positive:  As above Negative: As above  Physical Exam  BP (!) 110/58 (BP Location: Right Arm)   Pulse 72   Temp 99 F (37.2 C) (Oral)   Resp 17   Ht 5\' 1"  (1.549 m)   Wt 70.3 kg   SpO2 98%   BMI 29.29 kg/m  Gen:   Awake, no distress   Resp:  Normal effort  MSK:   Moves extremities without difficulty  Other:    Medical Decision Making  Medically screening exam initiated at 2:08 PM.  Appropriate orders placed.  [name] was informed that the remainder of the evaluation will be completed by another provider, this initial triage assessment does not replace that evaluation, and the importance of remaining in the ED until their evaluation is complete.     Naveed Humphres A, PA-C 02/17/24 1410

## 2024-02-17 NOTE — ED Triage Notes (Signed)
 Pt BIBA from home. C/o bright red blood from rectum in brief and while wiping that they noticed today.  AOx4

## 2024-02-17 NOTE — Discharge Instructions (Addendum)
 You were seen today for concerns of rectal bleeding. Your labs revealed your hemoglobin has been stable today with no significant decline or change in the last few days. Your hemoccult test was positive once again indicating blood present in the stool. I spoke with the [facility] on-call gastroenterologist who advised outpatient follow up in the clinic in the next 1-2 weeks. For any concerns of worsening symptoms, please return to the ER.

## 2024-02-17 NOTE — ED Notes (Signed)
 Pt was assisted with use of bedpan to void.

## 2024-02-18 ENCOUNTER — Telehealth: Payer: Self-pay | Admitting: Internal Medicine

## 2024-02-18 DIAGNOSIS — E559 Vitamin D deficiency, unspecified: Secondary | ICD-10-CM | POA: Diagnosis not present

## 2024-02-18 DIAGNOSIS — M7502 Adhesive capsulitis of left shoulder: Secondary | ICD-10-CM | POA: Diagnosis not present

## 2024-02-18 DIAGNOSIS — N184 Chronic kidney disease, stage 4 (severe): Secondary | ICD-10-CM | POA: Diagnosis not present

## 2024-02-18 DIAGNOSIS — D649 Anemia, unspecified: Secondary | ICD-10-CM | POA: Diagnosis not present

## 2024-02-18 DIAGNOSIS — I129 Hypertensive chronic kidney disease with stage 1 through stage 4 chronic kidney disease, or unspecified chronic kidney disease: Secondary | ICD-10-CM | POA: Diagnosis not present

## 2024-02-18 DIAGNOSIS — Z981 Arthrodesis status: Secondary | ICD-10-CM | POA: Diagnosis not present

## 2024-02-18 DIAGNOSIS — R7303 Prediabetes: Secondary | ICD-10-CM | POA: Diagnosis not present

## 2024-02-18 DIAGNOSIS — M25551 Pain in right hip: Secondary | ICD-10-CM | POA: Diagnosis not present

## 2024-02-18 DIAGNOSIS — M1990 Unspecified osteoarthritis, unspecified site: Secondary | ICD-10-CM | POA: Diagnosis not present

## 2024-02-18 DIAGNOSIS — Z90710 Acquired absence of both cervix and uterus: Secondary | ICD-10-CM | POA: Diagnosis not present

## 2024-02-18 DIAGNOSIS — M25552 Pain in left hip: Secondary | ICD-10-CM | POA: Diagnosis not present

## 2024-02-18 DIAGNOSIS — Z96653 Presence of artificial knee joint, bilateral: Secondary | ICD-10-CM | POA: Diagnosis not present

## 2024-02-18 DIAGNOSIS — M7501 Adhesive capsulitis of right shoulder: Secondary | ICD-10-CM | POA: Diagnosis not present

## 2024-02-18 DIAGNOSIS — Z853 Personal history of malignant neoplasm of breast: Secondary | ICD-10-CM | POA: Diagnosis not present

## 2024-02-18 DIAGNOSIS — K219 Gastro-esophageal reflux disease without esophagitis: Secondary | ICD-10-CM | POA: Diagnosis not present

## 2024-02-18 DIAGNOSIS — J45909 Unspecified asthma, uncomplicated: Secondary | ICD-10-CM | POA: Diagnosis not present

## 2024-02-18 DIAGNOSIS — M353 Polymyalgia rheumatica: Secondary | ICD-10-CM | POA: Diagnosis not present

## 2024-02-18 NOTE — Telephone Encounter (Signed)
 Copied from CRM [id number]. Topic: Clinical - Medical Advice >> Feb 18, 2024 11:55 AM Jethro Morrison wrote: Reason for CRM: [facility] [phone]. STATED HE WOULD LIKE A NURSE TO CALL HIM ABOUT HER CARE AND SOME NON COMPLIANCE ISSUES.

## 2024-02-18 NOTE — Telephone Encounter (Signed)
 Pt scheduled to see Santina Cull PA 02/28/24@ 1:30pm. This is afer PCP appt so she will be seen prior to 02/28/24.Please let them know about appt.

## 2024-02-18 NOTE — Telephone Encounter (Signed)
 Urgent referral in [place] from ED.  Patient is having diarrhea and blood in stool.  Patient last saw Dr. Elvin Hammer in 2019.  She has an appt with her PCP on 02/22/2024 and daughter is requesting a call back from the nurse to discuss.

## 2024-02-21 ENCOUNTER — Telehealth: Payer: Self-pay

## 2024-02-21 DIAGNOSIS — Z853 Personal history of malignant neoplasm of breast: Secondary | ICD-10-CM | POA: Diagnosis not present

## 2024-02-21 DIAGNOSIS — K219 Gastro-esophageal reflux disease without esophagitis: Secondary | ICD-10-CM | POA: Diagnosis not present

## 2024-02-21 DIAGNOSIS — Z981 Arthrodesis status: Secondary | ICD-10-CM | POA: Diagnosis not present

## 2024-02-21 DIAGNOSIS — M353 Polymyalgia rheumatica: Secondary | ICD-10-CM | POA: Diagnosis not present

## 2024-02-21 DIAGNOSIS — Z96653 Presence of artificial knee joint, bilateral: Secondary | ICD-10-CM | POA: Diagnosis not present

## 2024-02-21 DIAGNOSIS — N184 Chronic kidney disease, stage 4 (severe): Secondary | ICD-10-CM | POA: Diagnosis not present

## 2024-02-21 DIAGNOSIS — I129 Hypertensive chronic kidney disease with stage 1 through stage 4 chronic kidney disease, or unspecified chronic kidney disease: Secondary | ICD-10-CM | POA: Diagnosis not present

## 2024-02-21 DIAGNOSIS — M7501 Adhesive capsulitis of right shoulder: Secondary | ICD-10-CM | POA: Diagnosis not present

## 2024-02-21 DIAGNOSIS — Z90710 Acquired absence of both cervix and uterus: Secondary | ICD-10-CM | POA: Diagnosis not present

## 2024-02-21 DIAGNOSIS — J45909 Unspecified asthma, uncomplicated: Secondary | ICD-10-CM | POA: Diagnosis not present

## 2024-02-21 DIAGNOSIS — E559 Vitamin D deficiency, unspecified: Secondary | ICD-10-CM | POA: Diagnosis not present

## 2024-02-21 DIAGNOSIS — M25551 Pain in right hip: Secondary | ICD-10-CM | POA: Diagnosis not present

## 2024-02-21 DIAGNOSIS — M25552 Pain in left hip: Secondary | ICD-10-CM | POA: Diagnosis not present

## 2024-02-21 DIAGNOSIS — M1990 Unspecified osteoarthritis, unspecified site: Secondary | ICD-10-CM | POA: Diagnosis not present

## 2024-02-21 DIAGNOSIS — M7502 Adhesive capsulitis of left shoulder: Secondary | ICD-10-CM | POA: Diagnosis not present

## 2024-02-21 DIAGNOSIS — R7303 Prediabetes: Secondary | ICD-10-CM | POA: Diagnosis not present

## 2024-02-21 DIAGNOSIS — D649 Anemia, unspecified: Secondary | ICD-10-CM | POA: Diagnosis not present

## 2024-02-21 NOTE — Transitions of Care (Post Inpatient/ED Visit) (Unsigned)
   02/21/2024  Name: [name] MRN: [id number] DOB: 1936-01-24  Today's TOC FU Call Status: Today's TOC FU Call Status:: Unsuccessful Call (1st Attempt) Unsuccessful Call (1st Attempt) Date: 02/21/24  Attempted to reach the patient regarding the most recent Inpatient/ED visit.  Follow Up Plan: Additional outreach attempts will be made to reach the patient to complete the Transitions of Care (Post Inpatient/ED visit) call.   Signature Darrall Ellison, LPN [facility] Nurse Health Advisor Direct Dial [phone]

## 2024-02-22 ENCOUNTER — Ambulatory Visit (INDEPENDENT_AMBULATORY_CARE_PROVIDER_SITE_OTHER): Admitting: Internal Medicine

## 2024-02-22 ENCOUNTER — Encounter: Payer: Self-pay | Admitting: Internal Medicine

## 2024-02-22 VITALS — BP 102/68 | HR 75 | Temp 98.6°F | Ht 61.0 in | Wt 156.0 lb

## 2024-02-22 DIAGNOSIS — D649 Anemia, unspecified: Secondary | ICD-10-CM

## 2024-02-22 DIAGNOSIS — I10 Essential (primary) hypertension: Secondary | ICD-10-CM | POA: Diagnosis not present

## 2024-02-22 DIAGNOSIS — N184 Chronic kidney disease, stage 4 (severe): Secondary | ICD-10-CM | POA: Diagnosis not present

## 2024-02-22 DIAGNOSIS — K921 Melena: Secondary | ICD-10-CM | POA: Diagnosis not present

## 2024-02-22 MED ORDER — DAPAGLIFLOZIN PROPANEDIOL 10 MG PO TABS: 10.0000 mg | ORAL_TABLET | Freq: Every day | ORAL | 3 refills | Status: AC

## 2024-02-22 NOTE — Assessment & Plan Note (Signed)
 Lab Results  Component Value Date   WBC 10.5 02/17/2024   HGB 10.4 (L) 02/17/2024   HCT 33.2 (L) 02/17/2024   MCV 108.5 (H) 02/17/2024   PLT 232 02/17/2024  Stable overall, declines f/u lab today

## 2024-02-22 NOTE — Assessment & Plan Note (Signed)
 BP Readings from Last 3 Encounters:  02/22/24 102/68  02/17/24 (!) 152/67  02/15/24 (!) 91/49   Low normal, pt to continue medical treatment norvasc  5 every day, toprol  xl 25 qd

## 2024-02-22 NOTE — Transitions of Care (Post Inpatient/ED Visit) (Unsigned)
   02/22/2024  Name: [name] MRN: [id number] DOB: 09-10-1936  Today's TOC FU Call Status: Today's TOC FU Call Status:: Unsuccessful Call (2nd Attempt) Unsuccessful Call (1st Attempt) Date: 02/21/24 Unsuccessful Call (2nd Attempt) Date: 02/22/24  Attempted to reach the patient regarding the most recent Inpatient/ED visit.  Follow Up Plan: Additional outreach attempts will be made to reach the patient to complete the Transitions of Care (Post Inpatient/ED visit) call.   Signature Darrall Ellison, LPN [facility] Nurse Health Advisor Direct Dial [phone]

## 2024-02-22 NOTE — Assessment & Plan Note (Signed)
 Lab Results  Component Value Date   CREATININE 3.16 (H) 02/17/2024   Mild worsening overall GFR 14, cont to avoid nephrotoxins, f/u renal tomorrow as planned

## 2024-02-22 NOTE — Progress Notes (Signed)
 Patient ID: [name], female   DOB: [birth year], 88 y.o.   MRN: [id number]        Chief Complaint: follow up with daughter       HPI:  [name] is a 88 y.o. female here after recent ED visit with hematochezia, hgb stable and felt stable for d/c, has GI f/u appt already for may 5.  Pt denies chest pain, increased sob or doe, wheezing, orthopnea, PND, increased LE swelling, palpitations, dizziness or syncope.   Pt denies polydipsia, polyuria, or new focal neuro s/s.   Also has CKD4 and renal appt tomorrow with labs       Wt Readings from Last 3 Encounters:  02/22/24 156 lb (70.8 kg)  02/17/24 155 lb (70.3 kg)  02/13/24 159 lb 3.3 oz (72.2 kg)   BP Readings from Last 3 Encounters:  02/22/24 102/68  02/17/24 (!) 152/67  02/15/24 (!) 91/49         Past Medical History:  Diagnosis Date   Allergic rhinitis 10/30/2016   Anemia    Asthma    Breast cancer of upper-outer quadrant of left female breast (HCC) 11/08/2013   ER/PR+ Her2- Left IDC    Chronic renal insufficiency    Chronic rhinitis    Colon polyp    Diastolic dysfunction 07/10/2016   DJD (degenerative joint disease)    Dyspnea    Frozen shoulder    Full dentures    GERD (gastroesophageal reflux disease)    Hearing loss    Hypertension    Hyponatremia    Impaired glucose tolerance 07/18/2014   Memory loss    Morbid obesity (HCC)    Poor circulation    Vertigo    Wears glasses    Past Surgical History:  Procedure Laterality Date   ABDOMINAL HYSTERECTOMY     BREAST LUMPECTOMY WITH NEEDLE LOCALIZATION AND AXILLARY SENTINEL LYMPH NODE BX Left 12/04/2013   Procedure: BREAST LUMPECTOMY WITH NEEDLE LOCALIZATION AND AXILLARY SENTINEL LYMPH NODE BX;  Surgeon: Thayne Fine, MD;  Location: [facility];  Service: General;  Laterality: Left;   CATARACT EXTRACTION  2009   rt   COLONOSCOPY     EYE SURGERY Bilateral    cataract surgery   KNEE ARTHROSCOPY     both   LUMBAR  LAMINECTOMY/DECOMPRESSION MICRODISCECTOMY Left 12/23/2017   Procedure: Left Lumbar One-Two Laminectomy with microdiscectomy;  Surgeon: Isadora Mar, MD;  Location: [facility] OR;  Service: Neurosurgery;  Laterality: Left;  Left L1-2 Laminectomy with microdiscectomy   TONSILLECTOMY     TOTAL KNEE ARTHROPLASTY  2002   rt   TOTAL KNEE ARTHROPLASTY  2003   left   VESICOVAGINAL FISTULA CLOSURE W/ TAH  1980    reports that she has never smoked. She has never been exposed to tobacco smoke. She has never used smokeless tobacco. She reports that she does not drink alcohol and does not use drugs. family history includes Colon cancer in her mother; Healthy in her daughter; Heart attack (age of onset: 87) in her son; Lung cancer in her brother; Stomach cancer in her mother. Allergies  Allergen Reactions   Hydrocodone  Itching   Hydrocodone -Acetaminophen  Other (See Comments)   Lasix  [Furosemide ] Other (See Comments)    Dizziness.    Tizanidine  Other (See Comments)    Dizzy and fall   Tizanidine  Hcl Other (See Comments)   Iron  Hives and Other (See Comments)     bad constipation    Current Outpatient Medications on  File Prior to Visit  Medication Sig Dispense Refill   albuterol  (VENTOLIN  HFA) 108 (90 Base) MCG/ACT inhaler Inhale 1 puff into the lungs every 6 (six) hours as needed for wheezing or shortness of breath. 18 g 5   amLODipine  (NORVASC ) 5 MG tablet TAKE 1 TABLET BY MOUTH EVERY DAY (Patient taking differently: Take 5 mg by mouth every morning.) 90 tablet 3   aspirin  EC 81 MG tablet Take 81 mg by mouth every morning. Swallow whole.     Cholecalciferol (VITAMIN D3) 20 MCG (800 UNIT) TABS Take 1 tablet by mouth daily.     Ensure (ENSURE) Take 1 Can by mouth 3 (three) times daily between meals. (Patient taking differently: Take 237 mLs by mouth See admin instructions. Drink one can (237 mls) by mouth once or twice daily) 237 mL 12   FLUAD QUADRIVALENT 0.5 ML injection      fluticasone -salmeterol (WIXELA  INHUB) 500-50 MCG/ACT AEPB Inhale 1 puff into the lungs in the morning and at bedtime. 3 each 3   furosemide  (LASIX ) 40 MG tablet Take 1 tablet (40 mg total) by mouth every Monday, Wednesday, and Friday.     gabapentin  (NEURONTIN ) 300 MG capsule TAKE 1 CAPSULE BY MOUTH THREE TIMES A DAY 90 capsule 5   hydrocortisone  (ANUSOL -HC) 2.5 % rectal cream Place 1 Application rectally 2 (two) times daily. 30 g 1   Incontinence Supply Disposable (DEPEND UNDERWEAR SM/MED) MISC Use as directed four times per day 120 each 5   Magnesium  Glycinate 120 MG CAPS Take 2 tablets by mouth at bedtime. 90 capsule 3   meclizine  (ANTIVERT ) 12.5 MG tablet TAKE 1 TABLET BY MOUTH THREE TIMES A DAY AS NEEDED FOR DIZZINESS (Patient taking differently: Take 12.5 mg by mouth 3 (three) times daily as needed for dizziness.) 90 tablet 2   metoprolol  succinate (TOPROL -XL) 25 MG 24 hr tablet TAKE 1 TABLET (25 MG TOTAL) BY MOUTH DAILY. 90 tablet 3   montelukast  (SINGULAIR ) 10 MG tablet TAKE 1 TABLET BY MOUTH EVERY DAY 90 tablet 2   Multiple Vitamin (MULTIVITAMIN WITH MINERALS) TABS tablet Take 1 tablet by mouth daily. Centrum Silver     pantoprazole  (PROTONIX ) 40 MG tablet TAKE 1 TABLET BY MOUTH EVERY DAY 90 tablet 3   potassium chloride  (KLOR-CON ) 10 MEQ tablet TAKE 1 TABLET BY MOUTH EVERY DAY WHEN TAKING FUROSEMIDE  (Patient taking differently: Take 10 mEq by mouth every Monday, Wednesday, and Friday.) 90 tablet 3   SPIKEVAX syringe      predniSONE  (DELTASONE ) 5 MG tablet Take 1 tablet (5 mg total) by mouth daily with breakfast. (Patient not taking: Reported on 02/22/2024) 90 tablet 3   No current facility-administered medications on file prior to visit.        ROS:  All others reviewed and negative.  Objective        PE:  BP 102/68 (BP Location: Right Arm, Patient Position: Sitting, Cuff Size: Normal)   Pulse 75   Temp 98.6 F (37 C) (Oral)   Ht 5\' 1"  (1.549 m)   Wt 156 lb (70.8 kg)   SpO2 95%   BMI 29.48 kg/m                  Constitutional: Pt appears in NAD               HENT: Head: NCAT.                Right Ear: External ear normal.  Left Ear: External ear normal.                Eyes: . Pupils are equal, round, and reactive to light. Conjunctivae and EOM are normal               Nose: without d/c or deformity               Neck: Neck supple. Gross normal ROM               Cardiovascular: Normal rate and regular rhythm.                 Pulmonary/Chest: Effort normal and breath sounds without rales or wheezing.                Abd:  Soft, NT, ND, + BS, no organomegaly               Neurological: Pt is alert. At baseline orientation, motor grossly intact               Skin: Skin is warm. No rashes, no other new lesions, LE edema - none               Psychiatric: Pt behavior is normal without agitation   Micro: none  Cardiac tracings I have personally interpreted today:  none  Pertinent Radiological findings (summarize): none   Lab Results  Component Value Date   WBC 10.5 02/17/2024   HGB 10.4 (L) 02/17/2024   HCT 33.2 (L) 02/17/2024   PLT 232 02/17/2024   GLUCOSE 136 (H) 02/17/2024   CHOL 219 (H) 08/24/2023   TRIG 153.0 (H) 08/24/2023   HDL 57.90 08/24/2023   LDLDIRECT 137.0 02/02/2022   LDLCALC 131 (H) 08/24/2023   ALT 21 02/17/2024   AST 23 02/17/2024   NA 133 (L) 02/17/2024   K 4.8 02/17/2024   CL 91 (L) 02/17/2024   CREATININE 3.16 (H) 02/17/2024   BUN 73 (H) 02/17/2024   CO2 34 (H) 02/17/2024   TSH 1.09 06/25/2023   INR 1.0 07/27/2021   HGBA1C 6.4 08/24/2023   Assessment/Plan:  Latika EVIANA GUNNELL is a 88 y.o. Black or African American [2] female with  has a past medical history of Allergic rhinitis (10/30/2016), Anemia, Asthma, Breast cancer of upper-outer quadrant of left female breast (HCC) (11/08/2013), Chronic renal insufficiency, Chronic rhinitis, Colon polyp, Diastolic dysfunction (07/10/2016), DJD (degenerative joint disease), Dyspnea, Frozen shoulder, Full  dentures, GERD (gastroesophageal reflux disease), Hearing loss, Hypertension, Hyponatremia, Impaired glucose tolerance (07/18/2014), Memory loss, Morbid obesity (HCC), Poor circulation, Vertigo, and Wears glasses.  Hematochezia Did have one episode further bleeding small volume 2 days ago, declines need for f/u lab today as planned labs for tomorrow, has GI f/u appt may 5  Anemia Lab Results  Component Value Date   WBC 10.5 02/17/2024   HGB 10.4 (L) 02/17/2024   HCT 33.2 (L) 02/17/2024   MCV 108.5 (H) 02/17/2024   PLT 232 02/17/2024  Stable overall, declines f/u lab today  CKD (chronic kidney disease) stage 4, GFR 15-29 ml/min (HCC) Lab Results  Component Value Date   CREATININE 3.16 (H) 02/17/2024   Mild worsening overall GFR 14, cont to avoid nephrotoxins, f/u renal tomorrow as planned   Essential hypertension BP Readings from Last 3 Encounters:  02/22/24 102/68  02/17/24 (!) 152/67  02/15/24 (!) 91/49   Low normal, pt to continue medical treatment norvasc  5 every day, toprol  xl 25 qd  Followup: Return in about  3 months (around 05/23/2024).  Rosalia Colonel, MD 02/22/2024 9:10 PM  [facility]

## 2024-02-22 NOTE — Assessment & Plan Note (Signed)
 Did have one episode further bleeding small volume 2 days ago, declines need for f/u lab today as planned labs for tomorrow, has GI f/u appt may 5

## 2024-02-22 NOTE — Patient Instructions (Signed)
 Please continue all other medications as before, and refills have been done if requested.  Please have the pharmacy call with any other refills you may need.  Please continue your efforts at being more active, low cholesterol diet, and weight control.  Please keep your appointments with your specialists as you may have planned - GI and renal

## 2024-02-23 ENCOUNTER — Telehealth: Payer: Self-pay

## 2024-02-23 ENCOUNTER — Ambulatory Visit: Payer: Medicare HMO | Admitting: Podiatry

## 2024-02-23 DIAGNOSIS — R809 Proteinuria, unspecified: Secondary | ICD-10-CM | POA: Diagnosis not present

## 2024-02-23 DIAGNOSIS — M353 Polymyalgia rheumatica: Secondary | ICD-10-CM | POA: Diagnosis not present

## 2024-02-23 DIAGNOSIS — K219 Gastro-esophageal reflux disease without esophagitis: Secondary | ICD-10-CM | POA: Diagnosis not present

## 2024-02-23 DIAGNOSIS — N179 Acute kidney failure, unspecified: Secondary | ICD-10-CM | POA: Diagnosis not present

## 2024-02-23 DIAGNOSIS — J45909 Unspecified asthma, uncomplicated: Secondary | ICD-10-CM | POA: Diagnosis not present

## 2024-02-23 DIAGNOSIS — M1990 Unspecified osteoarthritis, unspecified site: Secondary | ICD-10-CM | POA: Diagnosis not present

## 2024-02-23 DIAGNOSIS — I129 Hypertensive chronic kidney disease with stage 1 through stage 4 chronic kidney disease, or unspecified chronic kidney disease: Secondary | ICD-10-CM | POA: Diagnosis not present

## 2024-02-23 DIAGNOSIS — N184 Chronic kidney disease, stage 4 (severe): Secondary | ICD-10-CM | POA: Diagnosis not present

## 2024-02-23 DIAGNOSIS — M7501 Adhesive capsulitis of right shoulder: Secondary | ICD-10-CM | POA: Diagnosis not present

## 2024-02-23 DIAGNOSIS — M25551 Pain in right hip: Secondary | ICD-10-CM | POA: Diagnosis not present

## 2024-02-23 DIAGNOSIS — R600 Localized edema: Secondary | ICD-10-CM | POA: Diagnosis not present

## 2024-02-23 DIAGNOSIS — Z981 Arthrodesis status: Secondary | ICD-10-CM | POA: Diagnosis not present

## 2024-02-23 DIAGNOSIS — M7502 Adhesive capsulitis of left shoulder: Secondary | ICD-10-CM | POA: Diagnosis not present

## 2024-02-23 DIAGNOSIS — N2581 Secondary hyperparathyroidism of renal origin: Secondary | ICD-10-CM | POA: Diagnosis not present

## 2024-02-23 DIAGNOSIS — R7303 Prediabetes: Secondary | ICD-10-CM | POA: Diagnosis not present

## 2024-02-23 DIAGNOSIS — Z853 Personal history of malignant neoplasm of breast: Secondary | ICD-10-CM | POA: Diagnosis not present

## 2024-02-23 DIAGNOSIS — Z96653 Presence of artificial knee joint, bilateral: Secondary | ICD-10-CM | POA: Diagnosis not present

## 2024-02-23 DIAGNOSIS — Z90710 Acquired absence of both cervix and uterus: Secondary | ICD-10-CM | POA: Diagnosis not present

## 2024-02-23 DIAGNOSIS — M25552 Pain in left hip: Secondary | ICD-10-CM | POA: Diagnosis not present

## 2024-02-23 DIAGNOSIS — D649 Anemia, unspecified: Secondary | ICD-10-CM | POA: Diagnosis not present

## 2024-02-23 DIAGNOSIS — E559 Vitamin D deficiency, unspecified: Secondary | ICD-10-CM | POA: Diagnosis not present

## 2024-02-23 DIAGNOSIS — D631 Anemia in chronic kidney disease: Secondary | ICD-10-CM | POA: Diagnosis not present

## 2024-02-23 DIAGNOSIS — N281 Cyst of kidney, acquired: Secondary | ICD-10-CM | POA: Diagnosis not present

## 2024-02-23 NOTE — Telephone Encounter (Signed)
 Copied from CRM [id number]. Topic: Clinical - Home Health Verbal Orders >> Feb 23, 2024  1:41 PM Howard Macho wrote: Caller/Agency: [facility] Callback Number: [phone] Service Requested: Occupational Therapy Frequency: once a week for eight weeks and also requesting verbal order to add speech therapy for cognitive  Any new concerns about the patient? No just need medication clarification for amlodipine , allopurinol  and potassium chloride . [name] want to know if the patient is still taking the medication

## 2024-02-23 NOTE — Telephone Encounter (Signed)
 Ok for [employer]

## 2024-02-23 NOTE — Transitions of Care (Post Inpatient/ED Visit) (Signed)
   02/23/2024  Name: [name] MRN: [id number] DOB: 09/15/1936  Today's TOC FU Call Status: Today's TOC FU Call Status:: Unsuccessful Call (2nd Attempt) Unsuccessful Call (1st Attempt) Date: 02/21/24 Unsuccessful Call (2nd Attempt) Date: 02/22/24  Attempted to reach the patient regarding the most recent Inpatient/ED visit.  Follow Up Plan: No further outreach attempts will be made at this time. We have been unable to contact the patient. Patient already seen in office Signature Darrall Ellison, LPN [facility] Nurse Health Advisor Direct Dial [phone]

## 2024-02-24 DIAGNOSIS — M1990 Unspecified osteoarthritis, unspecified site: Secondary | ICD-10-CM | POA: Diagnosis not present

## 2024-02-24 DIAGNOSIS — Z96653 Presence of artificial knee joint, bilateral: Secondary | ICD-10-CM | POA: Diagnosis not present

## 2024-02-24 DIAGNOSIS — M7501 Adhesive capsulitis of right shoulder: Secondary | ICD-10-CM | POA: Diagnosis not present

## 2024-02-24 DIAGNOSIS — D649 Anemia, unspecified: Secondary | ICD-10-CM | POA: Diagnosis not present

## 2024-02-24 DIAGNOSIS — Z853 Personal history of malignant neoplasm of breast: Secondary | ICD-10-CM | POA: Diagnosis not present

## 2024-02-24 DIAGNOSIS — R7303 Prediabetes: Secondary | ICD-10-CM | POA: Diagnosis not present

## 2024-02-24 DIAGNOSIS — N184 Chronic kidney disease, stage 4 (severe): Secondary | ICD-10-CM | POA: Diagnosis not present

## 2024-02-24 DIAGNOSIS — E559 Vitamin D deficiency, unspecified: Secondary | ICD-10-CM | POA: Diagnosis not present

## 2024-02-24 DIAGNOSIS — K219 Gastro-esophageal reflux disease without esophagitis: Secondary | ICD-10-CM | POA: Diagnosis not present

## 2024-02-24 DIAGNOSIS — M25551 Pain in right hip: Secondary | ICD-10-CM | POA: Diagnosis not present

## 2024-02-24 DIAGNOSIS — Z981 Arthrodesis status: Secondary | ICD-10-CM | POA: Diagnosis not present

## 2024-02-24 DIAGNOSIS — J45909 Unspecified asthma, uncomplicated: Secondary | ICD-10-CM | POA: Diagnosis not present

## 2024-02-24 DIAGNOSIS — M7502 Adhesive capsulitis of left shoulder: Secondary | ICD-10-CM | POA: Diagnosis not present

## 2024-02-24 DIAGNOSIS — I129 Hypertensive chronic kidney disease with stage 1 through stage 4 chronic kidney disease, or unspecified chronic kidney disease: Secondary | ICD-10-CM | POA: Diagnosis not present

## 2024-02-24 DIAGNOSIS — Z90710 Acquired absence of both cervix and uterus: Secondary | ICD-10-CM | POA: Diagnosis not present

## 2024-02-24 DIAGNOSIS — M25552 Pain in left hip: Secondary | ICD-10-CM | POA: Diagnosis not present

## 2024-02-24 DIAGNOSIS — M353 Polymyalgia rheumatica: Secondary | ICD-10-CM | POA: Diagnosis not present

## 2024-02-24 NOTE — Telephone Encounter (Signed)
 Ok to continue the amlodipine  and potassium, but can stop the allopurinol 

## 2024-02-25 NOTE — Telephone Encounter (Signed)
 Called and left voicemail giving verbals.

## 2024-02-28 ENCOUNTER — Encounter: Payer: Self-pay | Admitting: Physician Assistant

## 2024-02-28 ENCOUNTER — Telehealth: Payer: Self-pay

## 2024-02-28 ENCOUNTER — Other Ambulatory Visit (INDEPENDENT_AMBULATORY_CARE_PROVIDER_SITE_OTHER)

## 2024-02-28 ENCOUNTER — Ambulatory Visit: Admitting: Physician Assistant

## 2024-02-28 ENCOUNTER — Other Ambulatory Visit (HOSPITAL_COMMUNITY): Payer: Self-pay

## 2024-02-28 VITALS — BP 122/72 | HR 72 | Ht 61.0 in | Wt 147.0 lb

## 2024-02-28 DIAGNOSIS — N184 Chronic kidney disease, stage 4 (severe): Secondary | ICD-10-CM | POA: Diagnosis not present

## 2024-02-28 DIAGNOSIS — M7502 Adhesive capsulitis of left shoulder: Secondary | ICD-10-CM | POA: Diagnosis not present

## 2024-02-28 DIAGNOSIS — J45909 Unspecified asthma, uncomplicated: Secondary | ICD-10-CM | POA: Diagnosis not present

## 2024-02-28 DIAGNOSIS — Z860101 Personal history of adenomatous and serrated colon polyps: Secondary | ICD-10-CM | POA: Diagnosis not present

## 2024-02-28 DIAGNOSIS — R251 Tremor, unspecified: Secondary | ICD-10-CM | POA: Diagnosis not present

## 2024-02-28 DIAGNOSIS — D649 Anemia, unspecified: Secondary | ICD-10-CM

## 2024-02-28 DIAGNOSIS — K625 Hemorrhage of anus and rectum: Secondary | ICD-10-CM | POA: Diagnosis not present

## 2024-02-28 DIAGNOSIS — R195 Other fecal abnormalities: Secondary | ICD-10-CM | POA: Diagnosis not present

## 2024-02-28 DIAGNOSIS — Z86718 Personal history of other venous thrombosis and embolism: Secondary | ICD-10-CM

## 2024-02-28 DIAGNOSIS — M25552 Pain in left hip: Secondary | ICD-10-CM | POA: Diagnosis not present

## 2024-02-28 DIAGNOSIS — I129 Hypertensive chronic kidney disease with stage 1 through stage 4 chronic kidney disease, or unspecified chronic kidney disease: Secondary | ICD-10-CM | POA: Diagnosis not present

## 2024-02-28 DIAGNOSIS — M1990 Unspecified osteoarthritis, unspecified site: Secondary | ICD-10-CM | POA: Diagnosis not present

## 2024-02-28 DIAGNOSIS — R1011 Right upper quadrant pain: Secondary | ICD-10-CM

## 2024-02-28 DIAGNOSIS — A09 Infectious gastroenteritis and colitis, unspecified: Secondary | ICD-10-CM

## 2024-02-28 DIAGNOSIS — Z96653 Presence of artificial knee joint, bilateral: Secondary | ICD-10-CM | POA: Diagnosis not present

## 2024-02-28 DIAGNOSIS — Z90710 Acquired absence of both cervix and uterus: Secondary | ICD-10-CM | POA: Diagnosis not present

## 2024-02-28 DIAGNOSIS — Z7901 Long term (current) use of anticoagulants: Secondary | ICD-10-CM | POA: Diagnosis not present

## 2024-02-28 DIAGNOSIS — M7501 Adhesive capsulitis of right shoulder: Secondary | ICD-10-CM | POA: Diagnosis not present

## 2024-02-28 DIAGNOSIS — Z853 Personal history of malignant neoplasm of breast: Secondary | ICD-10-CM | POA: Diagnosis not present

## 2024-02-28 DIAGNOSIS — E559 Vitamin D deficiency, unspecified: Secondary | ICD-10-CM | POA: Diagnosis not present

## 2024-02-28 DIAGNOSIS — K219 Gastro-esophageal reflux disease without esophagitis: Secondary | ICD-10-CM | POA: Diagnosis not present

## 2024-02-28 DIAGNOSIS — Z981 Arthrodesis status: Secondary | ICD-10-CM | POA: Diagnosis not present

## 2024-02-28 DIAGNOSIS — M25551 Pain in right hip: Secondary | ICD-10-CM | POA: Diagnosis not present

## 2024-02-28 DIAGNOSIS — R7303 Prediabetes: Secondary | ICD-10-CM | POA: Diagnosis not present

## 2024-02-28 DIAGNOSIS — M353 Polymyalgia rheumatica: Secondary | ICD-10-CM | POA: Diagnosis not present

## 2024-02-28 LAB — COMPREHENSIVE METABOLIC PANEL WITH GFR
ALT: 17 U/L (ref 0–35)
AST: 23 U/L (ref 0–37)
Albumin: 4.2 g/dL (ref 3.5–5.2)
Alkaline Phosphatase: 59 U/L (ref 39–117)
BUN: 58 mg/dL — ABNORMAL HIGH (ref 6–23)
CO2: 32 meq/L (ref 19–32)
Calcium: 9.8 mg/dL (ref 8.4–10.5)
Chloride: 94 meq/L — ABNORMAL LOW (ref 96–112)
Creatinine, Ser: 2.31 mg/dL — ABNORMAL HIGH (ref 0.40–1.20)
GFR: 18.7 mL/min — ABNORMAL LOW (ref >60.00)
Glucose, Bld: 120 mg/dL — ABNORMAL HIGH (ref 70–99)
Potassium: 3.8 meq/L (ref 3.5–5.1)
Sodium: 141 meq/L (ref 135–145)
Total Bilirubin: 0.4 mg/dL (ref 0.2–1.2)
Total Protein: 7.6 g/dL (ref 6.0–8.3)

## 2024-02-28 LAB — CBC WITH DIFFERENTIAL/PLATELET
Basophils Absolute: 0.1 10*3/uL (ref 0.0–0.1)
Basophils Relative: 0.6 % (ref 0.0–3.0)
Eosinophils Absolute: 0.1 10*3/uL (ref 0.0–0.7)
Eosinophils Relative: 0.5 % (ref 0.0–5.0)
HCT: 35.9 % — ABNORMAL LOW (ref 36.0–46.0)
Hemoglobin: 11.6 g/dL — ABNORMAL LOW (ref 12.0–15.0)
Lymphocytes Relative: 9.6 % — ABNORMAL LOW (ref 12.0–46.0)
Lymphs Abs: 1 10*3/uL (ref 0.7–4.0)
MCHC: 32.3 g/dL (ref 30.0–36.0)
MCV: 104.7 fl — ABNORMAL HIGH (ref 78.0–100.0)
Monocytes Absolute: 0.8 10*3/uL (ref 0.1–1.0)
Monocytes Relative: 7.1 % (ref 3.0–12.0)
Neutro Abs: 8.7 10*3/uL — ABNORMAL HIGH (ref 1.4–7.7)
Neutrophils Relative %: 82.2 % — ABNORMAL HIGH (ref 43.0–77.0)
Platelets: 365 10*3/uL (ref 150.0–400.0)
RBC: 3.43 Mil/uL — ABNORMAL LOW (ref 3.87–5.11)
RDW: 14.8 % (ref 11.5–15.5)
WBC: 10.6 10*3/uL — ABNORMAL HIGH (ref 4.0–10.5)

## 2024-02-28 LAB — SEDIMENTATION RATE: Sed Rate: 55 mm/h — ABNORMAL HIGH (ref 0–30)

## 2024-02-28 LAB — IBC + FERRITIN
Ferritin: 90.2 ng/mL (ref 10.0–291.0)
Iron: 55 ug/dL (ref 42–145)
Saturation Ratios: 15.1 % — ABNORMAL LOW (ref 20.0–50.0)
TIBC: 364 ug/dL (ref 250.0–450.0)
Transferrin: 260 mg/dL (ref 212.0–360.0)

## 2024-02-28 MED ORDER — HYDROCORTISONE (PERIANAL) 2.5 % EX CREA
1.0000 | TOPICAL_CREAM | Freq: Two times a day (BID) | CUTANEOUS | 1 refills | Status: AC
Start: 2024-02-28 — End: ?

## 2024-02-28 MED ORDER — DICYCLOMINE HCL 20 MG PO TABS: 20.0000 mg | ORAL_TABLET | Freq: Three times a day (TID) | ORAL | 0 refills | Status: DC | PRN

## 2024-02-28 NOTE — Telephone Encounter (Signed)
 Pharmacy Patient Advocate Encounter   Received notification from CoverMyMeds that prior authorization for Dicyclomine HCl 20MG  tablets is required/requested.   Insurance verification completed.   The patient is insured through [employer] [facility] [employer] .   Per test claim: PA required; PA submitted to above mentioned insurance via CoverMyMeds Key/confirmation #/EOC [id number] Status is pending

## 2024-02-28 NOTE — Patient Instructions (Addendum)
 Your provider has requested that you go to the basement level for lab work before leaving today. Press "B" on the elevator. The lab is located at the first door on the left as you exit the elevator.  - Drink a lot of liquids that have water, salt, and sugar. Good choices are water mixed with juice, flavored soda, and soup broth. If you are drinking enough, your urine will be light yellow or almost clear.  - Try to eat a little food. Good choices are potatoes, noodles, rice, oatmeal, crackers, bananas, soup, and boiled vegetables.  - Avoid high fat foods, as they can make diarrhea worse.  - Dairy products (except yogurt) may be difficult to digest when you have diarrhea. I recommend that you temporarily avoid lactose-containing foods.  - Can try loperamide 4 mg initially, then 2 mg after each unformed stool for =2 days, with a maximum of 16 mg/day.  - If loperamide is not working, you could try bismuth salicylate (Pepto-Bismol) 30 mL or two tablets every 30 minutes for eight doses. Pepto-Bismol may make your stools black.   Go to the ER if any severe abdominal pain, fever, or weakness  _______________________________________________________  If your blood pressure at your visit was 140/90 or greater, please contact your primary care physician to follow up on this.  _______________________________________________________  If you are age 35 or older, your body mass index should be between 23-30. Your Body mass index is 27.78 kg/m. If this is out of the aforementioned range listed, please consider follow up with your Primary Care Provider.  If you are age 17 or younger, your body mass index should be between 19-25. Your Body mass index is 27.78 kg/m. If this is out of the aformentioned range listed, please consider follow up with your Primary Care Provider.   ________________________________________________________  The [facility] providers would like to encourage you to use MYCHART to  communicate with providers for non-urgent requests or questions.  Due to long hold times on the telephone, sending your provider a message by [facility] may be a faster and more efficient way to get a response.  Please allow 48 business hours for a response.  Please remember that this is for non-urgent requests.  _______________________________________________________  Thank you for entrusting me with your care and choosing [facility].  Santina Cull PA-C

## 2024-02-28 NOTE — Progress Notes (Signed)
 02/28/2024 [name] [id number] 03/19/1936  Referring provider: Roslyn Coombe, MD Primary GI doctor: Dr. Elvin Hammer  ASSESSMENT AND PLAN:  Diarrhea with history of fecal incontinence a month ago, rectal bleeding for 3 week, positive FOBT, watery stools with urinating Colonoscopy 2018 due to similar complaints good preparation diverticulosis internal hemorrhoids, biopsies unremarkable 12/10/2022 MRI AB pancreatic atrophy with ductal dilation, small nonaggressive cystic foci along pancrease, follow up 2 years ER visit 04/20 for hematuria and weakness, had some GI rectal bleeding thought due to diverticulosis, 04/25 for more hematochezia Declined colonoscopy due to age CT abdomen pelvis without contrast 02/13/2024 colonic diverticulosis no inflammation renal cortical lesions largest 3.8 unchanged from previous MR groundglass opacity lung bases, aortic atherosclerosis.  Decompressed stomach, small hiatal hernia No ABX, has not had blood in stool in 2-3 days, negative FOBT in the office - possible large prolapsed hemorrhoid, possible diverticular, less likely malignancy - recheck iron /ferritin/CBC -KUB to evaluate for stool burden and rule out possibility of overflow diarrhea - stool samples to rule out infection with chills, diatherix done in office    -ESR to rule out inflammation  -Pancreatic elastase/fecal fat due to pancreatic atrophy/bloating -start on anusol  HC, on exam had large inflammed prolapsed internal hemorrhoid - will give Bentyl as needed in the mean time  -Add on citracel/benefiber, FODMAP, trial off lactulose and lifestyle changes discussed - due to age and co morbid conditions, will not get endoscopy and patient also declines  RUQ pain MRI AB pancreatic atrophy with ductal dilation, cystic foci Try salon pas patches, appears to be more MSK/nerve pain Add on pantoprazole   Tremors Head tremors x 1 year, has has hand trembling since easter, worse with intention but  can have slight tremor with rest Walks with walker CT head without contrast- normal Follow up with PCP, appears to be more essential tremor  History IDA with workup 2018, macrocytic anemia, CKD stage IV 02/17/2024  HGB 10.4 MCV 108.5 Platelets 232 02/13/2024 Iron  54 Ferritin 54 B12 6,052 Recent Labs    06/25/23 1448 08/24/23 1423 02/01/24 1646 02/13/24 1123 02/13/24 1953 02/14/24 0638 02/15/24 0434 02/17/24 1355  HGB 12.9 12.0 11.8* 10.5* 10.8* 9.6* 9.4* 10.4*  -Workup 2018 with colonoscopy good preparation diverticulosis internal hemorrhoids unremarkable biopsies no recall secondary to age  Personal history of adenomatous polyps Colonoscopy 2018 with internal hemorrhoids, diverticulosis otherwise unremarkable.  Previous examinations 2003 2016 2011  Personal history of DVT On Eliquis   History of diastolic heart failure  CKD stage IV  Rheumatoid arthritis/PMR  Patient Care Team: Roslyn Coombe, MD as PCP - General (Internal Medicine) Magrinat, Rozella Cornfield, MD (Inactive) as Consulting Physician (Oncology) Beverlee Bucco, MD as Consulting Physician (Radiation Oncology) Merryl Abraham, MD as Consulting Physician (Obstetrics and Gynecology) Rella Cardinal, FNP as Nurse Practitioner (Psychiatry) Kandra Orn, MD as Consulting Physician (Radiology) [facility] as Consulting Physician (Ophthalmology)  HISTORY OF PRESENT ILLNESS: 88 y.o. female with a past medical history listed below presents for evaluation of diarrhea and rectal bleeding.  Last seen in the office 04/2018 by Dr. Elvin Hammer for IDA, diarrhea, rectal bleeding.  Discussed the use of AI scribe software for clinical note transcription with the patient, who gave verbal consent to proceed.  History of Present Illness   [name] is an 88 year old female with diverticulosis and hemorrhoids who presents with recurrent rectal bleeding and diarrhea. She is accompanied by her daughter,  [name].  She has been experiencing recurrent  rectal bleeding and diarrhea for the past month. The diarrhea began first, and medication prescribed was ineffective. A few days later, she noticed rectal bleeding described as 'dripping red' and similar in volume to menstrual bleeding. The bleeding persisted for a week before she sought urgent care, where she was observed for two days. The bleeding temporarily ceased during her hospital stay but resumed shortly after discharge.  She experiences watery diarrhea every time she urinates, sometimes waking her at night. She has not had a hard stool in a long time. No recent antibiotic use or urinary tract infections. No rectal pain, burning, or itching but notes some abdominal discomfort on her right side above the umbilicus, described as itchy.  She reports new-onset tremors in her hands and head since Easter, which are more pronounced during tasks such as writing or feeding herself. She has not consulted a neurologist for these symptoms. No known family history of tremors.        She  reports that she has never smoked. She has never been exposed to tobacco smoke. She has never used smokeless tobacco. She reports that she does not drink alcohol and does not use drugs.  RELEVANT GI HISTORY, IMAGING AND LABS: Results   RADIOLOGY Pancreatic MRI: Pancreatic atrophy (2023)  DIAGNOSTIC Colonoscopy: Diverticulosis, hemorrhoids, biopsies negative for colitis or microscopic colitis (2018) Rectal exam: Tenderness, no anal fissure, external hemorrhoid, possible internal hemorrhoid (02/17/2024)      CBC    Component Value Date/Time   WBC 10.5 02/17/2024 1355   RBC 3.06 (L) 02/17/2024 1355   HGB 10.4 (L) 02/17/2024 1355   HGB 10.0 (L) 10/25/2018 0946   HGB 10.2 (L) 07/27/2016 0934   HCT 33.2 (L) 02/17/2024 1355   HCT 31.3 (L) 07/27/2016 0934   PLT 232 02/17/2024 1355   PLT 254 10/25/2018 0946   PLT 200 07/27/2016 0934   MCV 108.5 (H) 02/17/2024 1355    MCV 96.0 07/27/2016 0934   MCH 34.0 02/17/2024 1355   MCHC 31.3 02/17/2024 1355   RDW 13.1 02/17/2024 1355   RDW 12.6 07/27/2016 0934   LYMPHSABS 1.1 02/13/2024 1123   LYMPHSABS 1.4 07/27/2016 0934   MONOABS 0.9 02/13/2024 1123   MONOABS 0.4 07/27/2016 0934   EOSABS 0.3 02/13/2024 1123   EOSABS 0.2 07/27/2016 0934   BASOSABS 0.0 02/13/2024 1123   BASOSABS 0.1 07/27/2016 0934   Recent Labs    06/25/23 1448 08/24/23 1423 02/01/24 1646 02/13/24 1123 02/13/24 1953 02/14/24 0638 02/15/24 0434 02/17/24 1355  HGB 12.9 12.0 11.8* 10.5* 10.8* 9.6* 9.4* 10.4*    CMP     Component Value Date/Time   NA 133 (L) 02/17/2024 1355   NA 138 07/27/2016 0935   K 4.8 02/17/2024 1355   K 4.4 07/27/2016 0935   CL 91 (L) 02/17/2024 1355   CO2 34 (H) 02/17/2024 1355   CO2 25 07/27/2016 0935   GLUCOSE 136 (H) 02/17/2024 1355   GLUCOSE 136 07/27/2016 0935   BUN 73 (H) 02/17/2024 1355   BUN 24.7 07/27/2016 0935   CREATININE 3.16 (H) 02/17/2024 1355   CREATININE 1.50 (H) 01/24/2021 1632   CREATININE 1.4 (H) 07/27/2016 0935   CALCIUM 8.8 (L) 02/17/2024 1355   CALCIUM 9.1 07/27/2016 0935   PROT 6.2 (L) 02/17/2024 1355   PROT 6.9 07/27/2016 0935   ALBUMIN 3.2 (L) 02/17/2024 1355   ALBUMIN 3.4 (L) 07/27/2016 0935   AST 23 02/17/2024 1355   AST 32 10/25/2018 0946   AST 23 07/27/2016  0935   ALT 21 02/17/2024 1355   ALT 27 10/25/2018 0946   ALT 18 07/27/2016 0935   ALKPHOS 60 02/17/2024 1355   ALKPHOS 50 07/27/2016 0935   BILITOT 0.8 02/17/2024 1355   BILITOT 0.4 10/25/2018 0946   BILITOT 0.32 07/27/2016 0935   GFRNONAA 14 (L) 02/17/2024 1355   GFRNONAA 21 (L) 12/25/2020 1548   GFRAA 25 (L) 12/25/2020 1548      Latest Ref Rng & Units 02/17/2024    1:55 PM 02/13/2024   11:23 AM 02/01/2024    4:46 PM  Hepatic Function  Total Protein 6.5 - 8.1 g/dL 6.2  6.2  6.6   Albumin 3.5 - 5.0 g/dL 3.2  3.2  4.0   AST 15 - 41 U/L 23  23  21    ALT 0 - 44 U/L 21  20  24    Alk Phosphatase 38 - 126  U/L 60  48  54   Total Bilirubin 0.0 - 1.2 mg/dL 0.8  0.5  0.5   Bilirubin, Direct 0.0 - 0.3 mg/dL   0.1       Current Medications:   Current Outpatient Medications (Endocrine & Metabolic):    dapagliflozin  propanediol (FARXIGA ) 10 MG TABS tablet, Take 1 tablet (10 mg total) by mouth daily before breakfast.   predniSONE  (DELTASONE ) 5 MG tablet, Take 1 tablet (5 mg total) by mouth daily with breakfast.  Current Outpatient Medications (Cardiovascular):    amLODipine  (NORVASC ) 5 MG tablet, TAKE 1 TABLET BY MOUTH EVERY DAY (Patient taking differently: Take 5 mg by mouth every morning.)   furosemide  (LASIX ) 40 MG tablet, Take 1 tablet (40 mg total) by mouth every Monday, Wednesday, and Friday.   metoprolol  succinate (TOPROL -XL) 25 MG 24 hr tablet, TAKE 1 TABLET (25 MG TOTAL) BY MOUTH DAILY.  Current Outpatient Medications (Respiratory):    albuterol  (VENTOLIN  HFA) 108 (90 Base) MCG/ACT inhaler, Inhale 1 puff into the lungs every 6 (six) hours as needed for wheezing or shortness of breath.   fluticasone -salmeterol (WIXELA INHUB) 500-50 MCG/ACT AEPB, Inhale 1 puff into the lungs in the morning and at bedtime.   montelukast  (SINGULAIR ) 10 MG tablet, TAKE 1 TABLET BY MOUTH EVERY DAY  Current Outpatient Medications (Analgesics):    aspirin  EC 81 MG tablet, Take 81 mg by mouth every morning. Swallow whole.   Current Outpatient Medications (Other):    Cholecalciferol (VITAMIN D3) 20 MCG (800 UNIT) TABS, Take 1 tablet by mouth daily.   dicyclomine (BENTYL) 20 MG tablet, Take 1 tablet (20 mg total) by mouth 3 (three) times daily as needed for spasms.   Ensure (ENSURE), Take 1 Can by mouth 3 (three) times daily between meals. (Patient taking differently: Take 237 mLs by mouth See admin instructions. Drink one can (237 mls) by mouth once or twice daily)   FLUAD QUADRIVALENT 0.5 ML injection,    gabapentin  (NEURONTIN ) 300 MG capsule, TAKE 1 CAPSULE BY MOUTH THREE TIMES A DAY   Incontinence Supply  Disposable (DEPEND UNDERWEAR SM/MED) MISC, Use as directed four times per day   Magnesium  Glycinate 120 MG CAPS, Take 2 tablets by mouth at bedtime.   meclizine  (ANTIVERT ) 12.5 MG tablet, TAKE 1 TABLET BY MOUTH THREE TIMES A DAY AS NEEDED FOR DIZZINESS (Patient taking differently: Take 12.5 mg by mouth 3 (three) times daily as needed for dizziness.)   Multiple Vitamin (MULTIVITAMIN WITH MINERALS) TABS tablet, Take 1 tablet by mouth daily. Centrum Silver   pantoprazole  (PROTONIX ) 40 MG tablet, TAKE 1  TABLET BY MOUTH EVERY DAY   potassium chloride  (KLOR-CON ) 10 MEQ tablet, TAKE 1 TABLET BY MOUTH EVERY DAY WHEN TAKING FUROSEMIDE  (Patient taking differently: Take 10 mEq by mouth every Monday, Wednesday, and Friday.)   SPIKEVAX syringe,    hydrocortisone  (ANUSOL -HC) 2.5 % rectal cream, Place 1 Application rectally 2 (two) times daily.  Medical History:  Past Medical History:  Diagnosis Date   Allergic rhinitis 10/30/2016   Anemia    Asthma    Breast cancer of upper-outer quadrant of left female breast (HCC) 11/08/2013   ER/PR+ Her2- Left IDC    Chronic renal insufficiency    Chronic rhinitis    Colon polyp    Diastolic dysfunction 07/10/2016   DJD (degenerative joint disease)    Dyspnea    Frozen shoulder    Full dentures    GERD (gastroesophageal reflux disease)    Hearing loss    Hypertension    Hyponatremia    Impaired glucose tolerance 07/18/2014   Memory loss    Morbid obesity (HCC)    Poor circulation    Vertigo    Wears glasses    Allergies:  Allergies  Allergen Reactions   Hydrocodone  Itching   Hydrocodone -Acetaminophen  Other (See Comments)   Lasix  [Furosemide ] Other (See Comments)    Dizziness.    Tizanidine  Other (See Comments)    Dizzy and fall   Tizanidine  Hcl Other (See Comments)   Iron  Hives and Other (See Comments)     bad constipation      Surgical History:  She  has a past surgical history that includes Cataract extraction (2009); Vesicovaginal fistula  closure w/ TAH (1980); Total knee arthroplasty (2002); Total knee arthroplasty (2003); Abdominal hysterectomy; Tonsillectomy; Knee arthroscopy; Colonoscopy; Breast lumpectomy with needle localization and axillary sentinel lymph node bx (Left, 12/04/2013); Eye surgery (Bilateral); and Lumbar laminectomy/decompression microdiscectomy (Left, 12/23/2017). Family History:  Her family history includes Colon cancer in her mother; Healthy in her daughter; Heart attack (age of onset: 28) in her son; Lung cancer in her brother; Stomach cancer in her mother.  REVIEW OF SYSTEMS  : All other systems reviewed and negative except where noted in the History of Present Illness.  PHYSICAL EXAM: BP 122/72   Pulse 72   Ht 5\' 1"  (1.549 m)   Wt 147 lb (66.7 kg)   BMI 27.78 kg/m  Physical Exam   GENERAL APPEARANCE: chronically ill appearing, with walker in no apparent distress. HEENT: No cervical lymphadenopathy, unremarkable thyroid , sclerae anicteric, conjunctiva pink. RESPIRATORY: Respiratory effort normal, breath sounds equal bilaterally without rales, rhonchi, or wheezing. CARDIO: Regular rate and rhythm with no murmurs, rubs, or gallops, peripheral pulses intact. ABDOMEN: Soft, non-distended, active bowel sounds in all four quadrants, no tenderness to palpation, no rebound, no mass appreciated. RECTAL: Prolapsed internal hemorrhoid observed, no large masses felt, negative FOBT. MUSCULOSKELETAL: Full range of motion, antalgic gait with walker without edema. SKIN: Dry, intact without rashes or lesions. No jaundice. NEURO: Alert, oriented, no focal deficits. Intention tremor and head tremor PSYCH: Cooperative, normal mood and affect.      Edmonia Gottron, PA-C 3:27 PM

## 2024-02-29 ENCOUNTER — Other Ambulatory Visit: Payer: Self-pay

## 2024-02-29 ENCOUNTER — Ambulatory Visit (INDEPENDENT_AMBULATORY_CARE_PROVIDER_SITE_OTHER)

## 2024-02-29 ENCOUNTER — Other Ambulatory Visit

## 2024-02-29 DIAGNOSIS — Z90710 Acquired absence of both cervix and uterus: Secondary | ICD-10-CM | POA: Diagnosis not present

## 2024-02-29 DIAGNOSIS — I129 Hypertensive chronic kidney disease with stage 1 through stage 4 chronic kidney disease, or unspecified chronic kidney disease: Secondary | ICD-10-CM | POA: Diagnosis not present

## 2024-02-29 DIAGNOSIS — Z96653 Presence of artificial knee joint, bilateral: Secondary | ICD-10-CM | POA: Diagnosis not present

## 2024-02-29 DIAGNOSIS — A09 Infectious gastroenteritis and colitis, unspecified: Secondary | ICD-10-CM | POA: Diagnosis not present

## 2024-02-29 DIAGNOSIS — D649 Anemia, unspecified: Secondary | ICD-10-CM

## 2024-02-29 DIAGNOSIS — M25551 Pain in right hip: Secondary | ICD-10-CM | POA: Diagnosis not present

## 2024-02-29 DIAGNOSIS — M7502 Adhesive capsulitis of left shoulder: Secondary | ICD-10-CM | POA: Diagnosis not present

## 2024-02-29 DIAGNOSIS — K219 Gastro-esophageal reflux disease without esophagitis: Secondary | ICD-10-CM | POA: Diagnosis not present

## 2024-02-29 DIAGNOSIS — M353 Polymyalgia rheumatica: Secondary | ICD-10-CM | POA: Diagnosis not present

## 2024-02-29 DIAGNOSIS — Z853 Personal history of malignant neoplasm of breast: Secondary | ICD-10-CM | POA: Diagnosis not present

## 2024-02-29 DIAGNOSIS — Z981 Arthrodesis status: Secondary | ICD-10-CM | POA: Diagnosis not present

## 2024-02-29 DIAGNOSIS — E559 Vitamin D deficiency, unspecified: Secondary | ICD-10-CM | POA: Diagnosis not present

## 2024-02-29 DIAGNOSIS — M25552 Pain in left hip: Secondary | ICD-10-CM | POA: Diagnosis not present

## 2024-02-29 DIAGNOSIS — M1990 Unspecified osteoarthritis, unspecified site: Secondary | ICD-10-CM | POA: Diagnosis not present

## 2024-02-29 DIAGNOSIS — R7303 Prediabetes: Secondary | ICD-10-CM | POA: Diagnosis not present

## 2024-02-29 DIAGNOSIS — J45909 Unspecified asthma, uncomplicated: Secondary | ICD-10-CM | POA: Diagnosis not present

## 2024-02-29 DIAGNOSIS — M7501 Adhesive capsulitis of right shoulder: Secondary | ICD-10-CM | POA: Diagnosis not present

## 2024-02-29 DIAGNOSIS — N184 Chronic kidney disease, stage 4 (severe): Secondary | ICD-10-CM | POA: Diagnosis not present

## 2024-02-29 LAB — VITAMIN B12: Vitamin B-12: >1537 pg/mL — ABNORMAL HIGH (ref 211–911)

## 2024-02-29 NOTE — Telephone Encounter (Signed)
 Pharmacy Patient Advocate Encounter  Received notification from [facility] Medicare that Prior Authorization for  Dicyclomine HCl 20MG  tablets has been APPROVED from 10-27-2023 to 10-25-2024   PA #/Case ID/Reference #: [id number]

## 2024-03-03 ENCOUNTER — Telehealth: Payer: Self-pay | Admitting: Physician Assistant

## 2024-03-03 NOTE — Telephone Encounter (Signed)
 [facility] GI pathogen panel and C. difficile negative.  Sent patient message

## 2024-03-04 LAB — FECAL FAT, QUALITATIVE
Fat Qual Neutral, Stl: NORMAL
Fat Qual Total, Stl: NORMAL

## 2024-03-05 LAB — PANCREATIC ELASTASE, FECAL: Pancreatic Elastase-1, Stool: 554 ug/g (ref >200)

## 2024-03-06 ENCOUNTER — Telehealth: Payer: Self-pay | Admitting: Physician Assistant

## 2024-03-06 DIAGNOSIS — A09 Infectious gastroenteritis and colitis, unspecified: Secondary | ICD-10-CM

## 2024-03-06 NOTE — Telephone Encounter (Signed)
 Called and spoke with patient and her daughter [name] regarding results. [name] informed me that patient has a great appetite but "everything she eats goes through her". [name] states that she thinks her mom has lost about 15 lbs since Easter. Patient is only taking Bentyl  once a day, advised that she can take this up to TID PRN. They are aware that new RX was sent to pharmacy on 02/28/24. Patient has not started Citrucel or Benefiber and will get that OTC. After reviewing last office note it was recommended that patient get KUB, this was not completed. I have placed the order and patient will complete on Wednesday so we can check for overflow diarrhea. [name] mentioned that patient is having one large loose stool a day. Advised that Mylinda Asa will further advise once we have results back. [name] verbalized understanding and had no concerns at the end of the call.

## 2024-03-06 NOTE — Telephone Encounter (Signed)
 Patient daughter called and stated that she is needing speak to the nurse about her mothers sample results. Patient daughter is requesting a call back at [phone]. Please advise.

## 2024-03-06 NOTE — Telephone Encounter (Signed)
 [facility] GI pathogen panel and C. difficile negative.

## 2024-03-06 NOTE — Telephone Encounter (Signed)
Estill Bamberg, please advise. Thanks!

## 2024-03-07 DIAGNOSIS — R7303 Prediabetes: Secondary | ICD-10-CM | POA: Diagnosis not present

## 2024-03-07 DIAGNOSIS — K219 Gastro-esophageal reflux disease without esophagitis: Secondary | ICD-10-CM | POA: Diagnosis not present

## 2024-03-07 DIAGNOSIS — M25552 Pain in left hip: Secondary | ICD-10-CM | POA: Diagnosis not present

## 2024-03-07 DIAGNOSIS — J45909 Unspecified asthma, uncomplicated: Secondary | ICD-10-CM | POA: Diagnosis not present

## 2024-03-07 DIAGNOSIS — M7502 Adhesive capsulitis of left shoulder: Secondary | ICD-10-CM | POA: Diagnosis not present

## 2024-03-07 DIAGNOSIS — M1990 Unspecified osteoarthritis, unspecified site: Secondary | ICD-10-CM | POA: Diagnosis not present

## 2024-03-07 DIAGNOSIS — D649 Anemia, unspecified: Secondary | ICD-10-CM | POA: Diagnosis not present

## 2024-03-07 DIAGNOSIS — Z96653 Presence of artificial knee joint, bilateral: Secondary | ICD-10-CM | POA: Diagnosis not present

## 2024-03-07 DIAGNOSIS — Z981 Arthrodesis status: Secondary | ICD-10-CM | POA: Diagnosis not present

## 2024-03-07 DIAGNOSIS — Z853 Personal history of malignant neoplasm of breast: Secondary | ICD-10-CM | POA: Diagnosis not present

## 2024-03-07 DIAGNOSIS — Z90710 Acquired absence of both cervix and uterus: Secondary | ICD-10-CM | POA: Diagnosis not present

## 2024-03-07 DIAGNOSIS — E559 Vitamin D deficiency, unspecified: Secondary | ICD-10-CM | POA: Diagnosis not present

## 2024-03-07 DIAGNOSIS — M353 Polymyalgia rheumatica: Secondary | ICD-10-CM | POA: Diagnosis not present

## 2024-03-07 DIAGNOSIS — I129 Hypertensive chronic kidney disease with stage 1 through stage 4 chronic kidney disease, or unspecified chronic kidney disease: Secondary | ICD-10-CM | POA: Diagnosis not present

## 2024-03-07 DIAGNOSIS — M7501 Adhesive capsulitis of right shoulder: Secondary | ICD-10-CM | POA: Diagnosis not present

## 2024-03-07 DIAGNOSIS — M25551 Pain in right hip: Secondary | ICD-10-CM | POA: Diagnosis not present

## 2024-03-07 DIAGNOSIS — N184 Chronic kidney disease, stage 4 (severe): Secondary | ICD-10-CM | POA: Diagnosis not present

## 2024-03-08 ENCOUNTER — Ambulatory Visit: Admitting: Podiatry

## 2024-03-08 ENCOUNTER — Ambulatory Visit (INDEPENDENT_AMBULATORY_CARE_PROVIDER_SITE_OTHER)
Admission: RE | Admit: 2024-03-08 | Discharge: 2024-03-08 | Disposition: A | Discharge status: Home / Self Care | Source: Ambulatory Visit | Attending: Physician Assistant | Admitting: Physician Assistant

## 2024-03-08 DIAGNOSIS — A09 Infectious gastroenteritis and colitis, unspecified: Secondary | ICD-10-CM | POA: Diagnosis not present

## 2024-03-08 DIAGNOSIS — I739 Peripheral vascular disease, unspecified: Secondary | ICD-10-CM

## 2024-03-08 DIAGNOSIS — B351 Tinea unguium: Secondary | ICD-10-CM | POA: Diagnosis not present

## 2024-03-08 DIAGNOSIS — M79674 Pain in right toe(s): Secondary | ICD-10-CM

## 2024-03-08 DIAGNOSIS — R197 Diarrhea, unspecified: Secondary | ICD-10-CM | POA: Diagnosis not present

## 2024-03-08 DIAGNOSIS — M79675 Pain in left toe(s): Secondary | ICD-10-CM

## 2024-03-08 DIAGNOSIS — I878 Other specified disorders of veins: Secondary | ICD-10-CM | POA: Diagnosis not present

## 2024-03-08 NOTE — Progress Notes (Signed)
  Subjective:  Patient ID: [name], female    DOB: [birth year],  MRN: [id number]  Chief Complaint  Patient presents with   Nail Problem    Routine foot care. Pt states pain in toes when lying down and pain decreases when standing.   88 y.o. female returns for the above complaint.  Patient presents with thickened elongated dystrophic mycotic toenails x 10 mild pain on palpation patient would like for me to debride down she is not able to do it herself.  Objective:  There were no vitals filed for this visit. Podiatric Exam: Vascular: dorsalis pedis and posterior tibial pulses are palpable bilateral. Capillary return is immediate. Temperature gradient is WNL. Skin turgor WNL  Sensorium: Normal Semmes Weinstein monofilament test. Normal tactile sensation bilaterally. Nail Exam: Pt has thick disfigured discolored nails with subungual debris noted bilateral entire nail hallux through fifth toenails.  Pain on palpation to the nails. Ulcer Exam: There is no evidence of ulcer or pre-ulcerative changes or infection. Orthopedic Exam: Muscle tone and strength are WNL. No limitations in general ROM. No crepitus or effusions noted.  Skin: No Porokeratosis. No infection or ulcers    Assessment & Plan:   1. Pain due to onychomycosis of toenails of both feet   2. PAD (peripheral artery disease) (HCC)       Patient was evaluated and treated and all questions answered.  Onychomycosis with pain  -Nails palliatively debrided as below. -Educated on self-care  Procedure: Nail Debridement Rationale: pain  Type of Debridement: manual, sharp debridement. Instrumentation: Nail nipper, rotary burr. Number of Nails: 10  Procedures and Treatment: Consent by patient was obtained for treatment procedures. The patient understood the discussion of treatment and procedures well. All questions were answered thoroughly reviewed. Debridement of mycotic and hypertrophic toenails, 1 through 5 bilateral  and clearing of subungual debris. No ulceration, no infection noted.  Return Visit-Office Procedure: Patient instructed to return to the office for a follow up visit 3 months for continued evaluation and treatment.  Tinnie Forehand, DPM    No follow-ups on file.

## 2024-03-10 ENCOUNTER — Encounter: Payer: Self-pay | Admitting: Physician Assistant

## 2024-03-13 ENCOUNTER — Ambulatory Visit: Payer: Self-pay | Admitting: Physician Assistant

## 2024-03-13 DIAGNOSIS — M7502 Adhesive capsulitis of left shoulder: Secondary | ICD-10-CM | POA: Diagnosis not present

## 2024-03-13 DIAGNOSIS — M353 Polymyalgia rheumatica: Secondary | ICD-10-CM | POA: Diagnosis not present

## 2024-03-13 DIAGNOSIS — J45909 Unspecified asthma, uncomplicated: Secondary | ICD-10-CM | POA: Diagnosis not present

## 2024-03-13 DIAGNOSIS — M25551 Pain in right hip: Secondary | ICD-10-CM | POA: Diagnosis not present

## 2024-03-13 DIAGNOSIS — K219 Gastro-esophageal reflux disease without esophagitis: Secondary | ICD-10-CM | POA: Diagnosis not present

## 2024-03-13 DIAGNOSIS — M1990 Unspecified osteoarthritis, unspecified site: Secondary | ICD-10-CM | POA: Diagnosis not present

## 2024-03-13 DIAGNOSIS — I129 Hypertensive chronic kidney disease with stage 1 through stage 4 chronic kidney disease, or unspecified chronic kidney disease: Secondary | ICD-10-CM | POA: Diagnosis not present

## 2024-03-13 DIAGNOSIS — Z90710 Acquired absence of both cervix and uterus: Secondary | ICD-10-CM | POA: Diagnosis not present

## 2024-03-13 DIAGNOSIS — R7303 Prediabetes: Secondary | ICD-10-CM | POA: Diagnosis not present

## 2024-03-13 DIAGNOSIS — Z96653 Presence of artificial knee joint, bilateral: Secondary | ICD-10-CM | POA: Diagnosis not present

## 2024-03-13 DIAGNOSIS — M7501 Adhesive capsulitis of right shoulder: Secondary | ICD-10-CM | POA: Diagnosis not present

## 2024-03-13 DIAGNOSIS — E559 Vitamin D deficiency, unspecified: Secondary | ICD-10-CM | POA: Diagnosis not present

## 2024-03-13 DIAGNOSIS — N184 Chronic kidney disease, stage 4 (severe): Secondary | ICD-10-CM | POA: Diagnosis not present

## 2024-03-13 DIAGNOSIS — Z981 Arthrodesis status: Secondary | ICD-10-CM | POA: Diagnosis not present

## 2024-03-13 DIAGNOSIS — Z853 Personal history of malignant neoplasm of breast: Secondary | ICD-10-CM | POA: Diagnosis not present

## 2024-03-13 DIAGNOSIS — D649 Anemia, unspecified: Secondary | ICD-10-CM | POA: Diagnosis not present

## 2024-03-13 DIAGNOSIS — M25552 Pain in left hip: Secondary | ICD-10-CM | POA: Diagnosis not present

## 2024-03-14 NOTE — Progress Notes (Signed)
 Noted

## 2024-03-17 ENCOUNTER — Other Ambulatory Visit: Payer: Self-pay | Admitting: Internal Medicine

## 2024-03-17 NOTE — Telephone Encounter (Unsigned)
 Copied from CRM [id number]. Topic: Clinical - Medication Refill >> Mar 17, 2024  4:04 PM Shereese L wrote: Medication: diphenoxylate -atropine  (LOMOTIL ) 2.5-0.025 MG tablet    Has the patient contacted their pharmacy? Yes (Agent: If no, request that the patient contact the pharmacy for the refill. If patient does not wish to contact the pharmacy document the reason why and proceed with request.) (Agent: If yes, when and what did the pharmacy advise?)  This is the patient's preferred pharmacy:  [id number] [place] [place] - [address] AT [address] OF [facility] [address] [place] [address] Phone: [phone] Fax: [phone]  Is this the correct pharmacy for this prescription? Yes If no, delete pharmacy and type the correct one.   Has the prescription been filled recently? Yes  Is the patient out of the medication? No  Has the patient been seen for an appointment in the last year OR does the patient have an upcoming appointment? Yes  Can we respond through MyChart? Yes  Agent: Please be advised that Rx refills may take up to 3 business days. We ask that you follow-up with your pharmacy.

## 2024-03-20 DIAGNOSIS — M19011 Primary osteoarthritis, right shoulder: Secondary | ICD-10-CM | POA: Diagnosis not present

## 2024-03-20 DIAGNOSIS — M19012 Primary osteoarthritis, left shoulder: Secondary | ICD-10-CM | POA: Diagnosis not present

## 2024-03-20 DIAGNOSIS — N184 Chronic kidney disease, stage 4 (severe): Secondary | ICD-10-CM | POA: Diagnosis not present

## 2024-03-21 ENCOUNTER — Other Ambulatory Visit: Payer: Self-pay

## 2024-03-21 MED ORDER — POLYETHYLENE GLYCOL 3350 17 GM/SCOOP PO POWD: 17.0000 g | Freq: Every day | ORAL | 1 refills | Status: DC

## 2024-03-22 ENCOUNTER — Telehealth: Payer: Self-pay | Admitting: Physician Assistant

## 2024-03-22 DIAGNOSIS — M25552 Pain in left hip: Secondary | ICD-10-CM | POA: Diagnosis not present

## 2024-03-22 DIAGNOSIS — N184 Chronic kidney disease, stage 4 (severe): Secondary | ICD-10-CM | POA: Diagnosis not present

## 2024-03-22 DIAGNOSIS — M353 Polymyalgia rheumatica: Secondary | ICD-10-CM | POA: Diagnosis not present

## 2024-03-22 DIAGNOSIS — K625 Hemorrhage of anus and rectum: Secondary | ICD-10-CM

## 2024-03-22 DIAGNOSIS — Z981 Arthrodesis status: Secondary | ICD-10-CM | POA: Diagnosis not present

## 2024-03-22 DIAGNOSIS — Z96653 Presence of artificial knee joint, bilateral: Secondary | ICD-10-CM | POA: Diagnosis not present

## 2024-03-22 DIAGNOSIS — M1990 Unspecified osteoarthritis, unspecified site: Secondary | ICD-10-CM | POA: Diagnosis not present

## 2024-03-22 DIAGNOSIS — D649 Anemia, unspecified: Secondary | ICD-10-CM | POA: Diagnosis not present

## 2024-03-22 DIAGNOSIS — Z90710 Acquired absence of both cervix and uterus: Secondary | ICD-10-CM | POA: Diagnosis not present

## 2024-03-22 DIAGNOSIS — M7502 Adhesive capsulitis of left shoulder: Secondary | ICD-10-CM | POA: Diagnosis not present

## 2024-03-22 DIAGNOSIS — K219 Gastro-esophageal reflux disease without esophagitis: Secondary | ICD-10-CM | POA: Diagnosis not present

## 2024-03-22 DIAGNOSIS — M7501 Adhesive capsulitis of right shoulder: Secondary | ICD-10-CM | POA: Diagnosis not present

## 2024-03-22 DIAGNOSIS — R7303 Prediabetes: Secondary | ICD-10-CM | POA: Diagnosis not present

## 2024-03-22 DIAGNOSIS — E559 Vitamin D deficiency, unspecified: Secondary | ICD-10-CM | POA: Diagnosis not present

## 2024-03-22 DIAGNOSIS — I129 Hypertensive chronic kidney disease with stage 1 through stage 4 chronic kidney disease, or unspecified chronic kidney disease: Secondary | ICD-10-CM | POA: Diagnosis not present

## 2024-03-22 DIAGNOSIS — J45909 Unspecified asthma, uncomplicated: Secondary | ICD-10-CM | POA: Diagnosis not present

## 2024-03-22 DIAGNOSIS — Z853 Personal history of malignant neoplasm of breast: Secondary | ICD-10-CM | POA: Diagnosis not present

## 2024-03-22 DIAGNOSIS — M25551 Pain in right hip: Secondary | ICD-10-CM | POA: Diagnosis not present

## 2024-03-22 NOTE — Telephone Encounter (Signed)
 Patient daughter called and stated that she would Tisha to call her back around noon if possible to avoid playing phone tag. Patient daughter stated that this was in reference to what was discuss yesterday with her mother medication. Please advise.

## 2024-03-22 NOTE — Telephone Encounter (Signed)
 Patient seen in the office 02/28/2023 and she either had the prolapsed internal hemorrhoid she was having the fecal incontinence the rectal bleeding watery stools with urinating she had a colonoscopy 2018 with good she had pancreatic atrophy in the CT on April 2024 diverticulosis but nothing bad .  Will give hemorrhoid suppository and cream for the rectal bleeding I think that will help that the x-ray showed that moderate stool there and so you know she has had the fecal incontinence all that my big fear was that was actually a blockage right like sometimes you can actually have a fecal blockage there that can cause that leakage around it to last night though with did she have more bowel movements primarily watery stools no fevers no chills no abdominal pain no nausea vomiting okay so still.  Will do miralax  bowel purge with enema.  Then do miralax  low dose and enema Continue the hydrocortisone  cream for rectal bleeding ER precautions discussed Schedule follow up

## 2024-03-23 NOTE — Telephone Encounter (Signed)
 Patient daughter returning call to be further advised on previous note. Please advise.   [phone].   States she has ana appointment but she can be reached after 2:45.

## 2024-03-24 ENCOUNTER — Other Ambulatory Visit: Payer: Self-pay

## 2024-03-24 ENCOUNTER — Ambulatory Visit: Payer: Self-pay | Admitting: Physician Assistant

## 2024-03-24 ENCOUNTER — Telehealth: Payer: Self-pay | Admitting: Physician Assistant

## 2024-03-24 ENCOUNTER — Other Ambulatory Visit

## 2024-03-24 DIAGNOSIS — K625 Hemorrhage of anus and rectum: Secondary | ICD-10-CM

## 2024-03-24 LAB — CBC WITH DIFFERENTIAL/PLATELET
Basophils Absolute: 0.1 10*3/uL (ref 0.0–0.1)
Basophils Relative: 0.6 % (ref 0.0–3.0)
Eosinophils Absolute: 0 10*3/uL (ref 0.0–0.7)
Eosinophils Relative: 0.5 % (ref 0.0–5.0)
HCT: 35.8 % — ABNORMAL LOW (ref 36.0–46.0)
Hemoglobin: 11.8 g/dL — ABNORMAL LOW (ref 12.0–15.0)
Lymphocytes Relative: 9.6 % — ABNORMAL LOW (ref 12.0–46.0)
Lymphs Abs: 0.8 10*3/uL (ref 0.7–4.0)
MCHC: 33.1 g/dL (ref 30.0–36.0)
MCV: 100.7 fl — ABNORMAL HIGH (ref 78.0–100.0)
Monocytes Absolute: 0.5 10*3/uL (ref 0.1–1.0)
Monocytes Relative: 5.3 % (ref 3.0–12.0)
Neutro Abs: 7.3 10*3/uL (ref 1.4–7.7)
Neutrophils Relative %: 84 % — ABNORMAL HIGH (ref 43.0–77.0)
Platelets: 222 10*3/uL (ref 150.0–400.0)
RBC: 3.56 Mil/uL — ABNORMAL LOW (ref 3.87–5.11)
RDW: 15.1 % (ref 11.5–15.5)
WBC: 8.6 10*3/uL (ref 4.0–10.5)

## 2024-03-24 NOTE — Telephone Encounter (Signed)
 Patient daughter called and sated that she would like to either speak to Dr. Alberta Almond nurse or Dr. Elvin Hammer himself. Patient daughter stated that she has helped her mother completing her enima and would like to know the next steps. Patient daughter her mother is still experiencing rectal bleeding. Patient daughter also stated that her mother described to her that her BM are looking like snot since doing the enima. Patient daughter is requesting a call back. Please advise.

## 2024-03-24 NOTE — Telephone Encounter (Signed)
 Patient did MiraLAX  and Dulcolax bowel movement with Fleet enema. Has been having large-volume bowel movements with mucus and blood. States is more blood than usual she has been continuing hydrocortisone  cream and suppositories do not work due to decreased rectal tone. She has had some dizziness with sitting up in bed but states this is normal denies chest pain shortness of breath. Last hemoglobin 11.6 will repeat stat CBC today, directed to go to the ER if any worsening symptoms or pending her hemoglobin.  Discussed again her last colonoscopy was 2008 while retreating this as this could be hemorrhoids diverticular some concern this could be a malignancy with obstruction CT was unremarkable no lymphadenopathy was is reassuring, still not wish for colonoscopy at this time. Continue with MiraLAX , fiber and this plan. Daughter and patient were both on the phone and they agreed.

## 2024-03-24 NOTE — Telephone Encounter (Signed)
 Pt stated that she took an enema last night along with a laxative. Pt stated that afterwards she had a BM that had blood and "snot" in it. Pt stated that this morning she has had three additional BM that had blood in it and the first BM had "snot".  No abdominal pain. Pt does have a history of rectal bleeding and see recently in the office. Pt stated that the " snot" is new for her and she has not had this before.  No abdominal pain or any other GI symptoms.  Please review and advise

## 2024-03-24 NOTE — Addendum Note (Signed)
 Addended by: Santina Cull on: 03/24/2024 12:20 PM   Modules accepted: Orders

## 2024-03-24 NOTE — Telephone Encounter (Signed)
 Patient daughter called stated that she was hope to see if the nurse could let know what her mother lab results are. Patient daughter is requesting a call back. Please advise.

## 2024-03-24 NOTE — Telephone Encounter (Signed)
 I think this was sent to you as the pt was last seen by [facility] in by pod b

## 2024-03-27 DIAGNOSIS — D649 Anemia, unspecified: Secondary | ICD-10-CM | POA: Diagnosis not present

## 2024-03-27 DIAGNOSIS — N184 Chronic kidney disease, stage 4 (severe): Secondary | ICD-10-CM | POA: Diagnosis not present

## 2024-03-27 DIAGNOSIS — M1990 Unspecified osteoarthritis, unspecified site: Secondary | ICD-10-CM | POA: Diagnosis not present

## 2024-03-27 DIAGNOSIS — M7501 Adhesive capsulitis of right shoulder: Secondary | ICD-10-CM | POA: Diagnosis not present

## 2024-03-27 DIAGNOSIS — R7303 Prediabetes: Secondary | ICD-10-CM | POA: Diagnosis not present

## 2024-03-27 DIAGNOSIS — M7502 Adhesive capsulitis of left shoulder: Secondary | ICD-10-CM | POA: Diagnosis not present

## 2024-03-27 DIAGNOSIS — I129 Hypertensive chronic kidney disease with stage 1 through stage 4 chronic kidney disease, or unspecified chronic kidney disease: Secondary | ICD-10-CM | POA: Diagnosis not present

## 2024-03-27 DIAGNOSIS — Z853 Personal history of malignant neoplasm of breast: Secondary | ICD-10-CM | POA: Diagnosis not present

## 2024-03-27 DIAGNOSIS — M25552 Pain in left hip: Secondary | ICD-10-CM | POA: Diagnosis not present

## 2024-03-27 DIAGNOSIS — E559 Vitamin D deficiency, unspecified: Secondary | ICD-10-CM | POA: Diagnosis not present

## 2024-03-27 DIAGNOSIS — Z96653 Presence of artificial knee joint, bilateral: Secondary | ICD-10-CM | POA: Diagnosis not present

## 2024-03-27 DIAGNOSIS — Z981 Arthrodesis status: Secondary | ICD-10-CM | POA: Diagnosis not present

## 2024-03-27 DIAGNOSIS — J45909 Unspecified asthma, uncomplicated: Secondary | ICD-10-CM | POA: Diagnosis not present

## 2024-03-27 DIAGNOSIS — M25551 Pain in right hip: Secondary | ICD-10-CM | POA: Diagnosis not present

## 2024-03-27 DIAGNOSIS — M353 Polymyalgia rheumatica: Secondary | ICD-10-CM | POA: Diagnosis not present

## 2024-03-27 DIAGNOSIS — Z90710 Acquired absence of both cervix and uterus: Secondary | ICD-10-CM | POA: Diagnosis not present

## 2024-03-27 DIAGNOSIS — K219 Gastro-esophageal reflux disease without esophagitis: Secondary | ICD-10-CM | POA: Diagnosis not present

## 2024-03-29 NOTE — Telephone Encounter (Signed)
 Inbound call from patient's daughter also requesting a call to discuss scheduling colonoscopy further. Stated patient is still having loose stools but have not seen any blood. Please advise, thank you

## 2024-03-29 NOTE — Telephone Encounter (Signed)
 PT daughter is calling for an update on scheduling colonoscopy. PT is very adamant on having this done and upset, very depressed because she feels so bad. Please review and advise. Thank you.

## 2024-03-29 NOTE — Telephone Encounter (Signed)
 Mylinda Asa, it looks like you spoke with patient and her daughter last Friday. Please advise on next steps. Thanks

## 2024-03-30 NOTE — Progress Notes (Unsigned)
 03/31/2024 [name] [id number] 1936/06/18  Referring provider: Roslyn Coombe, MD Primary GI doctor: Dr. Elvin Hammer  ASSESSMENT AND PLAN:  Diarrhea with history of fecal incontinence a month ago, rectal bleeding for 3 week, positive FOBT, watery stools with urinating Colonoscopy 2018 due to similar complaints good preparation diverticulosis internal hemorrhoids, biopsies unremarkable 12/10/2022 MRI AB pancreatic atrophy with ductal dilation, small nonaggressive cystic foci along pancrease, follow up 2 years CT abdomen pelvis without contrast 02/13/2024 unremarkable  negative FOBT in the office neg pancreatic elastase, negative Cdiff/GI pathogen, normal iron /ferritin  Bleeding has improved, KUB shows moderate stools burden Did miralax  purge and continued miralax  twice daily with benefiber but having loose stools, continuing leaking, imodium/lomital was not helping - stop miralax  - continue fiber twice a day - add on choleysteramine, start 1/2 packet daily, can increase - if this is not helping will consider flex sig as the patient is interested and would like evaluation but discussed she is high risk and I think it would be low yield with recent colon 2018, will hold off for now  RUQ pain MRI AB pancreatic atrophy with ductal dilation, cystic foci Try salon pas patches, appears to be more MSK/nerve pain -on pantoprazole  - has improved  Tremors Head tremors x 1 year, has has hand trembling since easter, worse with intention but can have slight tremor with rest Walks with walker CT head without contrast- normal Follow up with PCP, appears to be more essential tremor  History IDA with workup 2018, macrocytic anemia, CKD stage IV 03/24/2024  HGB 11.8 MCV 100.7 Platelets 222.0 02/28/2024 Iron  55 Ferritin 90.2 B12 >1537 Recent Labs    06/25/23 1448 08/24/23 1423 02/01/24 1646 02/13/24 1123 02/13/24 1953 02/14/24 0638 02/15/24 0434 02/17/24 1355 02/28/24 1429  03/24/24 1414  HGB 12.9 12.0 11.8* 10.5* 10.8* 9.6* 9.4* 10.4* 11.6* 11.8*  -Workup 2018 with colonoscopy good preparation diverticulosis internal hemorrhoids unremarkable biopsies no recall secondary to age - normal last OV  Personal history of adenomatous polyps Colonoscopy 2018 with internal hemorrhoids, diverticulosis otherwise unremarkable.   Previous examinations 2003 2016 2011  Personal history of DVT On BASA  Not on blood thinner  History of diastolic heart failure  CKD stage IV Lab Results  Component Value Date   NA 141 02/28/2024   CL 94 (L) 02/28/2024   K 3.8 02/28/2024   CO2 32 02/28/2024   BUN 58 (H) 02/28/2024   CREATININE 2.31 (H) 02/28/2024   GFR 18.70 (L) 02/28/2024   CALCIUM 9.8 02/28/2024   ALBUMIN 4.2 02/28/2024   GLUCOSE 120 (H) 02/28/2024   Rheumatoid arthritis/PMR  Patient Care Team: Roslyn Coombe, MD as PCP - General (Internal Medicine) Magrinat, Rozella Cornfield, MD (Inactive) as Consulting Physician (Oncology) Beverlee Bucco, MD as Consulting Physician (Radiation Oncology) Merryl Abraham, MD as Consulting Physician (Obstetrics and Gynecology) Rella Cardinal, FNP as Nurse Practitioner (Psychiatry) Kandra Orn, MD as Consulting Physician (Radiology) [facility] as Consulting Physician (Ophthalmology)  HISTORY OF PRESENT ILLNESS: 88 y.o. female with a past medical history listed below presents for evaluation of diarrhea and rectal bleeding.  Last seen in the office 04/2018 by Dr. Elvin Hammer for IDA, diarrhea, rectal bleeding.  Discussed the use of AI scribe software for clinical note transcription with the patient, who gave verbal consent to proceed.  History of Present Illness   [name] is an 88 year old female who presents with persistent gastrointestinal symptoms.  She experiences persistent watery stools  occurring every time she urinates, including at night, leading to fecal incontinence. The stool is  described as 'running' and 'quicksand-like'. Despite the cessation of bleeding, these symptoms persist, significantly impacting her quality of life and causing frustration and depression. No abdominal pain, bloating, or significant discomfort, although she experiences frequent burping. There was a past episode of pain, but it has not recurred since the onset of her current symptoms.  She has been using MiraLAX  and Benefiber, taking both twice daily. She previously tried Imodium without success. Her last colonoscopy in 2018 for similar symptoms did not reveal microscopic colitis. An x-ray on Mar 08, 2024, showed a moderate stool burden throughout the colon, indicating diffuse stool rather than a true blockage.  Her social activities, including visits to the [facility], senior centers, and church, have been affected by her condition. She has experienced weight fluctuations, with a noted decrease from 158 lbs to 152 lbs recently, although the rate of weight loss has slowed.        She  reports that she has never smoked. She has never been exposed to tobacco smoke. She has never used smokeless tobacco. She reports that she does not drink alcohol and does not use drugs.  RELEVANT GI HISTORY, IMAGING AND LABS: Results   LABS C. diff: negative GI pathogen panel: negative iron : normal ferritin: normal GFR: 18  RADIOLOGY MRI pancreas: atrophy X-ray abdomen: moderate stool burden, diffuse stool throughout colon (03/08/2024) CT abdomen: no acute findings, no inflammation, diverticula present (02/13/2024)  DIAGNOSTIC rectal exam: decreased rectal tone colonoscopy: no microscopic colitis (2018)     Wt Readings from Last 10 Encounters:  03/31/24 152 lb (68.9 kg)  02/28/24 147 lb (66.7 kg)  02/22/24 156 lb (70.8 kg)  02/17/24 155 lb (70.3 kg)  02/13/24 159 lb 3.3 oz (72.2 kg)  02/01/24 157 lb (71.2 kg)  01/12/24 159 lb (72.1 kg)  12/24/23 151 lb (68.5 kg)  10/04/23 158 lb 9.6 oz (71.9 kg)  08/24/23  155 lb (70.3 kg)     CBC    Component Value Date/Time   WBC 8.6 03/24/2024 1414   RBC 3.56 (L) 03/24/2024 1414   HGB 11.8 (L) 03/24/2024 1414   HGB 10.0 (L) 10/25/2018 0946   HGB 10.2 (L) 07/27/2016 0934   HCT 35.8 (L) 03/24/2024 1414   HCT 31.3 (L) 07/27/2016 0934   PLT 222.0 03/24/2024 1414   PLT 254 10/25/2018 0946   PLT 200 07/27/2016 0934   MCV 100.7 (H) 03/24/2024 1414   MCV 96.0 07/27/2016 0934   MCH 34.0 02/17/2024 1355   MCHC 33.1 03/24/2024 1414   RDW 15.1 03/24/2024 1414   RDW 12.6 07/27/2016 0934   LYMPHSABS 0.8 03/24/2024 1414   LYMPHSABS 1.4 07/27/2016 0934   MONOABS 0.5 03/24/2024 1414   MONOABS 0.4 07/27/2016 0934   EOSABS 0.0 03/24/2024 1414   EOSABS 0.2 07/27/2016 0934   BASOSABS 0.1 03/24/2024 1414   BASOSABS 0.1 07/27/2016 0934   Recent Labs    06/25/23 1448 08/24/23 1423 02/01/24 1646 02/13/24 1123 02/13/24 1953 02/14/24 0638 02/15/24 0434 02/17/24 1355 02/28/24 1429 03/24/24 1414  HGB 12.9 12.0 11.8* 10.5* 10.8* 9.6* 9.4* 10.4* 11.6* 11.8*    CMP     Component Value Date/Time   NA 141 02/28/2024 1429   NA 138 07/27/2016 0935   K 3.8 02/28/2024 1429   K 4.4 07/27/2016 0935   CL 94 (L) 02/28/2024 1429   CO2 32 02/28/2024 1429   CO2 25 07/27/2016 0935  GLUCOSE 120 (H) 02/28/2024 1429   GLUCOSE 136 07/27/2016 0935   BUN 58 (H) 02/28/2024 1429   BUN 24.7 07/27/2016 0935   CREATININE 2.31 (H) 02/28/2024 1429   CREATININE 1.50 (H) 01/24/2021 1632   CREATININE 1.4 (H) 07/27/2016 0935   CALCIUM 9.8 02/28/2024 1429   CALCIUM 9.1 07/27/2016 0935   PROT 7.6 02/28/2024 1429   PROT 6.9 07/27/2016 0935   ALBUMIN 4.2 02/28/2024 1429   ALBUMIN 3.4 (L) 07/27/2016 0935   AST 23 02/28/2024 1429   AST 32 10/25/2018 0946   AST 23 07/27/2016 0935   ALT 17 02/28/2024 1429   ALT 27 10/25/2018 0946   ALT 18 07/27/2016 0935   ALKPHOS 59 02/28/2024 1429   ALKPHOS 50 07/27/2016 0935   BILITOT 0.4 02/28/2024 1429   BILITOT 0.4 10/25/2018 0946    BILITOT 0.32 07/27/2016 0935   GFRNONAA 14 (L) 02/17/2024 1355   GFRNONAA 21 (L) 12/25/2020 1548   GFRAA 25 (L) 12/25/2020 1548      Latest Ref Rng & Units 02/28/2024    2:29 PM 02/17/2024    1:55 PM 02/13/2024   11:23 AM  Hepatic Function  Total Protein 6.0 - 8.3 g/dL 7.6  6.2  6.2   Albumin 3.5 - 5.2 g/dL 4.2  3.2  3.2   AST 0 - 37 U/L 23  23  23    ALT 0 - 35 U/L 17  21  20    Alk Phosphatase 39 - 117 U/L 59  60  48   Total Bilirubin 0.2 - 1.2 mg/dL 0.4  0.8  0.5       Current Medications:   Current Outpatient Medications (Endocrine & Metabolic):    dapagliflozin  propanediol (FARXIGA ) 10 MG TABS tablet, Take 1 tablet (10 mg total) by mouth daily before breakfast.   predniSONE  (DELTASONE ) 5 MG tablet, Take 1 tablet (5 mg total) by mouth daily with breakfast.  Current Outpatient Medications (Cardiovascular):    cholestyramine (QUESTRAN) 4 g packet, Take 1 packet (4 g total) by mouth 2 (two) times daily as needed (diarrhea start with 1/2 pack a day and adjust as needed).   furosemide  (LASIX ) 40 MG tablet, Take 1 tablet (40 mg total) by mouth every Monday, Wednesday, and Friday.   metoprolol  succinate (TOPROL -XL) 25 MG 24 hr tablet, TAKE 1 TABLET (25 MG TOTAL) BY MOUTH DAILY.   amLODipine  (NORVASC ) 5 MG tablet, TAKE 1 TABLET BY MOUTH EVERY DAY (Patient not taking: Reported on 03/31/2024)  Current Outpatient Medications (Respiratory):    albuterol  (VENTOLIN  HFA) 108 (90 Base) MCG/ACT inhaler, Inhale 1 puff into the lungs every 6 (six) hours as needed for wheezing or shortness of breath.   fluticasone -salmeterol (WIXELA INHUB) 500-50 MCG/ACT AEPB, Inhale 1 puff into the lungs in the morning and at bedtime.   montelukast  (SINGULAIR ) 10 MG tablet, TAKE 1 TABLET BY MOUTH EVERY DAY  Current Outpatient Medications (Analgesics):    aspirin  EC 81 MG tablet, Take 81 mg by mouth every morning. Swallow whole.   Current Outpatient Medications (Other):    Cholecalciferol (VITAMIN D3) 20 MCG (800  UNIT) TABS, Take 1 tablet by mouth daily.   dicyclomine  (BENTYL ) 20 MG tablet, Take 1 tablet (20 mg total) by mouth 3 (three) times daily as needed for spasms.   diphenoxylate -atropine  (LOMOTIL ) 2.5-0.025 MG tablet, TAKE 1 TABLET BY MOUTH 4 (FOUR) TIMES DAILY AS NEEDED FOR DIARRHEA OR LOOSE STOOLS.   Ensure (ENSURE), Take 1 Can by mouth 3 (three) times daily between  meals. (Patient taking differently: Take 237 mLs by mouth See admin instructions. Drink one can (237 mls) by mouth once or twice daily)   FLUAD QUADRIVALENT 0.5 ML injection,    hydrocortisone  (ANUSOL -HC) 2.5 % rectal cream, Place 1 Application rectally 2 (two) times daily.   Incontinence Supply Disposable (DEPEND UNDERWEAR SM/MED) MISC, Use as directed four times per day   Magnesium  Glycinate 120 MG CAPS, Take 2 tablets by mouth at bedtime.   meclizine  (ANTIVERT ) 12.5 MG tablet, TAKE 1 TABLET BY MOUTH THREE TIMES A DAY AS NEEDED FOR DIZZINESS (Patient taking differently: Take 12.5 mg by mouth 3 (three) times daily as needed for dizziness.)   Multiple Vitamin (MULTIVITAMIN WITH MINERALS) TABS tablet, Take 1 tablet by mouth daily. Centrum Silver   pantoprazole  (PROTONIX ) 40 MG tablet, TAKE 1 TABLET BY MOUTH EVERY DAY   polyethylene glycol powder (GLYCOLAX /MIRALAX ) 17 GM/SCOOP powder, Take 17 g by mouth daily.   potassium chloride  (KLOR-CON ) 10 MEQ tablet, TAKE 1 TABLET BY MOUTH EVERY DAY WHEN TAKING FUROSEMIDE  (Patient taking differently: Take 10 mEq by mouth every Monday, Wednesday, and Friday.)   SPIKEVAX syringe,    gabapentin  (NEURONTIN ) 300 MG capsule, TAKE 1 CAPSULE BY MOUTH THREE TIMES A DAY (Patient not taking: Reported on 03/31/2024)  Medical History:  Past Medical History:  Diagnosis Date   Allergic rhinitis 10/30/2016   Anemia    Asthma    Breast cancer of upper-outer quadrant of left female breast (HCC) 11/08/2013   ER/PR+ Her2- Left IDC    Chronic renal insufficiency    Chronic rhinitis    Colon polyp    Diastolic  dysfunction 07/10/2016   DJD (degenerative joint disease)    Dyspnea    Frozen shoulder    Full dentures    GERD (gastroesophageal reflux disease)    Hearing loss    Hypertension    Hyponatremia    Impaired glucose tolerance 07/18/2014   Memory loss    Morbid obesity (HCC)    Poor circulation    Vertigo    Wears glasses    Allergies:  Allergies  Allergen Reactions   Hydrocodone  Itching   Hydrocodone -Acetaminophen  Other (See Comments)   Lasix  [Furosemide ] Other (See Comments)    Dizziness.    Tizanidine  Other (See Comments)    Dizzy and fall   Tizanidine  Hcl Other (See Comments)   Iron  Hives and Other (See Comments)     bad constipation      Surgical History:  She  has a past surgical history that includes Cataract extraction (2009); Vesicovaginal fistula closure w/ TAH (1980); Total knee arthroplasty (2002); Total knee arthroplasty (2003); Abdominal hysterectomy; Tonsillectomy; Knee arthroscopy; Colonoscopy; Breast lumpectomy with needle localization and axillary sentinel lymph node bx (Left, 12/04/2013); Eye surgery (Bilateral); and Lumbar laminectomy/decompression microdiscectomy (Left, 12/23/2017). Family History:  Her family history includes Colon cancer in her mother; Healthy in her daughter; Heart attack (age of onset: 93) in her son; Lung cancer in her brother; Stomach cancer in her mother.  REVIEW OF SYSTEMS  : All other systems reviewed and negative except where noted in the History of Present Illness.  PHYSICAL EXAM: BP 110/70   Pulse 80   Ht 5\' 1"  (1.549 m)   Wt 152 lb (68.9 kg)   BMI 28.72 kg/m  Physical Exam   GENERAL APPEARANCE: chronically ill appearing, with walker in no apparent distress. HEENT: No cervical lymphadenopathy, unremarkable thyroid , sclerae anicteric, conjunctiva pink. RESPIRATORY: Respiratory effort normal, breath sounds equal bilaterally without rales, rhonchi,  or wheezing. CARDIO: Regular rate and rhythm with no murmurs, rubs, or gallops,  peripheral pulses intact. ABDOMEN: Soft, non-distended, active bowel sounds in all four quadrants, no tenderness to palpation, no rebound, no mass appreciated. RECTAL: Prolapsed internal hemorrhoid observed, no large masses felt, negative FOBT. MUSCULOSKELETAL: Full range of motion, antalgic gait with walker without edema. SKIN: Dry, intact without rashes or lesions. No jaundice. NEURO: Alert, oriented, no focal deficits. Intention tremor and head tremor PSYCH: Cooperative, normal mood and affect.      Edmonia Gottron, PA-C 11:24 AM

## 2024-03-30 NOTE — Telephone Encounter (Signed)
 Pt added on for tomorrow with Mylinda Asa at 8:40 am.

## 2024-03-31 ENCOUNTER — Ambulatory Visit: Admitting: Physician Assistant

## 2024-03-31 ENCOUNTER — Telehealth: Payer: Self-pay | Admitting: Physician Assistant

## 2024-03-31 ENCOUNTER — Encounter: Payer: Self-pay | Admitting: Physician Assistant

## 2024-03-31 VITALS — BP 110/70 | HR 80 | Ht 61.0 in | Wt 152.0 lb

## 2024-03-31 DIAGNOSIS — R1011 Right upper quadrant pain: Secondary | ICD-10-CM

## 2024-03-31 DIAGNOSIS — Z860101 Personal history of adenomatous and serrated colon polyps: Secondary | ICD-10-CM

## 2024-03-31 DIAGNOSIS — K219 Gastro-esophageal reflux disease without esophagitis: Secondary | ICD-10-CM

## 2024-03-31 DIAGNOSIS — N184 Chronic kidney disease, stage 4 (severe): Secondary | ICD-10-CM | POA: Diagnosis not present

## 2024-03-31 DIAGNOSIS — Z86718 Personal history of other venous thrombosis and embolism: Secondary | ICD-10-CM | POA: Diagnosis not present

## 2024-03-31 DIAGNOSIS — R195 Other fecal abnormalities: Secondary | ICD-10-CM | POA: Diagnosis not present

## 2024-03-31 DIAGNOSIS — K625 Hemorrhage of anus and rectum: Secondary | ICD-10-CM | POA: Diagnosis not present

## 2024-03-31 DIAGNOSIS — I82413 Acute embolism and thrombosis of femoral vein, bilateral: Secondary | ICD-10-CM

## 2024-03-31 DIAGNOSIS — A09 Infectious gastroenteritis and colitis, unspecified: Secondary | ICD-10-CM

## 2024-03-31 DIAGNOSIS — R251 Tremor, unspecified: Secondary | ICD-10-CM

## 2024-03-31 DIAGNOSIS — I5032 Chronic diastolic (congestive) heart failure: Secondary | ICD-10-CM

## 2024-03-31 MED ORDER — CHOLESTYRAMINE 4 G PO PACK: 4.0000 g | PACK | Freq: Two times a day (BID) | ORAL | 0 refills | Status: DC | PRN

## 2024-03-31 NOTE — Progress Notes (Signed)
 Reviewed. Favor conservative measures in this 88 year old

## 2024-03-31 NOTE — Telephone Encounter (Signed)
 PT daughter [name] called to let us  know that after PT was seen today she had 2 accidents and he BM are very bloody. Please advise.

## 2024-03-31 NOTE — Patient Instructions (Addendum)
 Your provider has ordered "Diatherix" stool testing for you. We have preformed that on you today, Please carry the test down to the Basement level and turn into the lab. We will contact you once we receive the results  - stop miralax  - continue fiber twice a day - add on choleysteramine, start 1/2 packet daily, can increase - if this is not helping will do flex sig, please call me  Toileting tips to help with your constipation - Drink at least 64-80 ounces of water/liquid per day. - Establish a time to try to move your bowels every day.  For many people, this is after a cup of coffee or after a meal such as breakfast. - Sit all of the way back on the toilet keeping your back fairly straight and while sitting up, try to rest the tops of your forearms on your upper thighs.   - Raising your feet with a step stool/squatty potty can be helpful to improve the angle that allows your stool to pass through the rectum. - Relax the rectum feeling it bulge toward the toilet water.  If you feel your rectum raising toward your body, you are contracting rather than relaxing. - Breathe in and slowly exhale. "Belly breath" by expanding your belly towards your belly button. Keep belly expanded as you gently direct pressure down and back to the anus.  A low pitched GRRR sound can assist with increasing intra-abdominal pressure.  (Can also trying to blow on a pinwheel and make it move, this helps with the same belly breathing) - Repeat 3-4 times. If unsuccessful, contract the pelvic floor to restore normal tone and get off the toilet.  Avoid excessive straining. - To reduce excessive wiping by teaching your anus to normally contract, place hands on outer aspect of knees and resist knee movement outward.  Hold 5-10 second then place hands just inside of knees and resist inward movement of knees.  Hold 5 seconds.  Repeat a few times each way.  Go to the ER if unable to pass gas, severe AB pain, unable to hold down food,  any shortness of breath of chest pain.     We have sent the following medications to your pharmacy for you to pick up at your convenience: Questran   Due to recent changes in healthcare laws, you may see the results of your imaging and laboratory studies on MyChart before your provider has had a chance to review them.  We understand that in some cases there may be results that are confusing or concerning to you. Not all laboratory results come back in the same time frame and the provider may be waiting for multiple results in order to interpret others.  Please give us  48 hours in order for your provider to thoroughly review all the results before contacting the office for clarification of your results.   I appreciate the  opportunity to care for you  Thank You   [facility]

## 2024-03-31 NOTE — Telephone Encounter (Signed)
 Pt daughter [name] made aware of Santina Cull PA recommendations: Pt verbalized understanding with all questions answered.

## 2024-03-31 NOTE — Telephone Encounter (Signed)
 Spoke with pt and pt daughter [name]. Pt stated that she has had 4 BM since she has been home and the last two she noticed that when she wipes that there was bright red blood. Pt stated that she does not feel that the blood was in the stool but just when she wipes.  Pt and Pt daughter wanted to make Santina Cull PA aware.

## 2024-04-03 ENCOUNTER — Other Ambulatory Visit: Payer: Self-pay | Admitting: Physician Assistant

## 2024-04-03 ENCOUNTER — Ambulatory Visit: Admitting: Gastroenterology

## 2024-04-03 ENCOUNTER — Telehealth: Payer: Self-pay | Admitting: Physician Assistant

## 2024-04-03 DIAGNOSIS — J45909 Unspecified asthma, uncomplicated: Secondary | ICD-10-CM | POA: Diagnosis not present

## 2024-04-03 DIAGNOSIS — K219 Gastro-esophageal reflux disease without esophagitis: Secondary | ICD-10-CM | POA: Diagnosis not present

## 2024-04-03 DIAGNOSIS — N184 Chronic kidney disease, stage 4 (severe): Secondary | ICD-10-CM | POA: Diagnosis not present

## 2024-04-03 DIAGNOSIS — M353 Polymyalgia rheumatica: Secondary | ICD-10-CM | POA: Diagnosis not present

## 2024-04-03 DIAGNOSIS — M7501 Adhesive capsulitis of right shoulder: Secondary | ICD-10-CM | POA: Diagnosis not present

## 2024-04-03 DIAGNOSIS — R7303 Prediabetes: Secondary | ICD-10-CM | POA: Diagnosis not present

## 2024-04-03 DIAGNOSIS — M7502 Adhesive capsulitis of left shoulder: Secondary | ICD-10-CM | POA: Diagnosis not present

## 2024-04-03 DIAGNOSIS — M1990 Unspecified osteoarthritis, unspecified site: Secondary | ICD-10-CM | POA: Diagnosis not present

## 2024-04-03 DIAGNOSIS — M25552 Pain in left hip: Secondary | ICD-10-CM | POA: Diagnosis not present

## 2024-04-03 DIAGNOSIS — Z853 Personal history of malignant neoplasm of breast: Secondary | ICD-10-CM | POA: Diagnosis not present

## 2024-04-03 DIAGNOSIS — M25551 Pain in right hip: Secondary | ICD-10-CM | POA: Diagnosis not present

## 2024-04-03 DIAGNOSIS — Z90710 Acquired absence of both cervix and uterus: Secondary | ICD-10-CM | POA: Diagnosis not present

## 2024-04-03 DIAGNOSIS — D649 Anemia, unspecified: Secondary | ICD-10-CM | POA: Diagnosis not present

## 2024-04-03 DIAGNOSIS — I129 Hypertensive chronic kidney disease with stage 1 through stage 4 chronic kidney disease, or unspecified chronic kidney disease: Secondary | ICD-10-CM | POA: Diagnosis not present

## 2024-04-03 DIAGNOSIS — E559 Vitamin D deficiency, unspecified: Secondary | ICD-10-CM | POA: Diagnosis not present

## 2024-04-03 DIAGNOSIS — Z96653 Presence of artificial knee joint, bilateral: Secondary | ICD-10-CM | POA: Diagnosis not present

## 2024-04-03 DIAGNOSIS — Z981 Arthrodesis status: Secondary | ICD-10-CM | POA: Diagnosis not present

## 2024-04-03 NOTE — Telephone Encounter (Signed)
 [name], 1.  She has had the same problem for years. 2.  She underwent colonoscopy to evaluate incontinence and rectal bleeding in 2018. - Incontinence secondary to lack of rectal tone.  Not much to do to help this problem, unfortunately.  I would expect her to pass stool when she urinates because there is no rectal tone. - Bleeding secondary to hemorrhoids 3.  I do not think she needs repeat procedure.  She is high risk due to her age and associated medical problems 4.  Recommend - Anusol  HC suppositories (or generic equivalent) for hemorrhoids.  Place 1 per rectum nightly - Citrucel 2 heaping tablespoons daily - Protective undergarments at all times - Scheduled visits to the bathroom.  Try sitting on the toilet 4 times daily to see if she can evacuate stool.  This may lead to less unexpected incontinent episodes

## 2024-04-03 NOTE — Telephone Encounter (Signed)
 Called and spoke with [name] regarding all recommendations. [name] is very frustrated with the often changes in recommendations for her to purchase OTC items, she spent over 10 minutes relaying how frustrated she is. [name] is retired and does not wish to continue trialing different forms of supplements for the patient due to cost. [name] thinks that a lot of patient's symptoms are related to her anxiety. [name] states that they will not be adding Citrucel or suppositories. Patient will continue Benefiber, Lomotil , Questran , Anusol  cream, etc as prescribed. I told [name] that I would send a high fiber diet to patient's MyChart to see if adding it to patient's food will help any. [name] verbalized understanding and had no concerns at the end of the call.

## 2024-04-03 NOTE — Telephone Encounter (Signed)
 Called and spoke with patient's daughter, [name]. [name] states that patient is experiencing incontinence, she wakes up soiled with stool. [name] informs me that patient is taking Questran  BID, Lomotil  up to 4 x/day, Bentyl  10 mg once a day, and fiber BID. Despite medication regimen patient passes a loose stool every time she urinates, about 4-5 x/ day. Patient is wearing Depends and poise pads, even with this patient soiled through these. Patient is tolerating a regular diet, no nausea or vomiting, but always has diarrhea. Patient reports BRBPR with some BM's. They are seeking further recommendations at this time. Diatherix C. Diff was negative, they are aware. Please advise, thanks.

## 2024-04-03 NOTE — Telephone Encounter (Signed)
 Patient daughter called and sated that her mother states she has not being seeing any changes at all since being on Questran  4 G packet. Patient is also experiencing pain under her breast radiating on both side of her abdomin. Patient daughter states that she is having dark stools and some blood with her stool, Patient daughter is requesting a call back at [phone]. Please advise.

## 2024-04-11 ENCOUNTER — Other Ambulatory Visit: Payer: Self-pay | Admitting: Internal Medicine

## 2024-04-11 DIAGNOSIS — Z90710 Acquired absence of both cervix and uterus: Secondary | ICD-10-CM | POA: Diagnosis not present

## 2024-04-11 DIAGNOSIS — M1990 Unspecified osteoarthritis, unspecified site: Secondary | ICD-10-CM | POA: Diagnosis not present

## 2024-04-11 DIAGNOSIS — Z853 Personal history of malignant neoplasm of breast: Secondary | ICD-10-CM | POA: Diagnosis not present

## 2024-04-11 DIAGNOSIS — M25552 Pain in left hip: Secondary | ICD-10-CM | POA: Diagnosis not present

## 2024-04-11 DIAGNOSIS — Z96653 Presence of artificial knee joint, bilateral: Secondary | ICD-10-CM | POA: Diagnosis not present

## 2024-04-11 DIAGNOSIS — I129 Hypertensive chronic kidney disease with stage 1 through stage 4 chronic kidney disease, or unspecified chronic kidney disease: Secondary | ICD-10-CM | POA: Diagnosis not present

## 2024-04-11 DIAGNOSIS — E559 Vitamin D deficiency, unspecified: Secondary | ICD-10-CM | POA: Diagnosis not present

## 2024-04-11 DIAGNOSIS — J45909 Unspecified asthma, uncomplicated: Secondary | ICD-10-CM | POA: Diagnosis not present

## 2024-04-11 DIAGNOSIS — Z981 Arthrodesis status: Secondary | ICD-10-CM | POA: Diagnosis not present

## 2024-04-11 DIAGNOSIS — R7303 Prediabetes: Secondary | ICD-10-CM | POA: Diagnosis not present

## 2024-04-11 DIAGNOSIS — M7501 Adhesive capsulitis of right shoulder: Secondary | ICD-10-CM | POA: Diagnosis not present

## 2024-04-11 DIAGNOSIS — M7502 Adhesive capsulitis of left shoulder: Secondary | ICD-10-CM | POA: Diagnosis not present

## 2024-04-11 DIAGNOSIS — M25551 Pain in right hip: Secondary | ICD-10-CM | POA: Diagnosis not present

## 2024-04-11 DIAGNOSIS — D649 Anemia, unspecified: Secondary | ICD-10-CM | POA: Diagnosis not present

## 2024-04-11 DIAGNOSIS — K219 Gastro-esophageal reflux disease without esophagitis: Secondary | ICD-10-CM | POA: Diagnosis not present

## 2024-04-11 DIAGNOSIS — N184 Chronic kidney disease, stage 4 (severe): Secondary | ICD-10-CM | POA: Diagnosis not present

## 2024-04-11 DIAGNOSIS — M353 Polymyalgia rheumatica: Secondary | ICD-10-CM | POA: Diagnosis not present

## 2024-04-12 ENCOUNTER — Other Ambulatory Visit: Payer: Self-pay | Admitting: Internal Medicine

## 2024-04-12 MED ORDER — DIPHENOXYLATE-ATROPINE 2.5-0.025 MG PO TABS: 1.0000 | ORAL_TABLET | Freq: Four times a day (QID) | ORAL | 1 refills | Status: AC | PRN

## 2024-04-12 NOTE — Telephone Encounter (Signed)
 Copied from CRM [id number]. Topic: Clinical - Medication Refill >> Apr 12, 2024  9:47 AM Allyne Areola wrote: Medication: diphenoxylate -atropine  (LOMOTIL ) 2.5-0.025 MG tablet [[id number]]  Has the patient contacted their pharmacy? Yes, contact primary care provider.  (Agent: If no, request that the patient contact the pharmacy for the refill. If patient does not wish to contact the pharmacy document the reason why and proceed with request.) (Agent: If yes, when and what did the pharmacy advise?)  This is the patient's preferred pharmacy:  [id number] - [place], [place] - [address] AT [address] OF [facility] [address] [place] [address] Phone: [phone] Fax: [phone]  Is this the correct pharmacy for this prescription? Yes If no, delete pharmacy and type the correct one.   Has the prescription been filled recently? No  Is the patient out of the medication? Yes  Has the patient been seen for an appointment in the last year OR does the patient have an upcoming appointment? Yes  Can we respond through MyChart? Yes  Agent: Please be advised that Rx refills may take up to 3 business days. We ask that you follow-up with your pharmacy.

## 2024-04-13 ENCOUNTER — Ambulatory Visit: Admitting: Internal Medicine

## 2024-04-16 ENCOUNTER — Other Ambulatory Visit: Payer: Self-pay | Admitting: Internal Medicine

## 2024-04-17 ENCOUNTER — Other Ambulatory Visit: Payer: Self-pay

## 2024-04-17 DIAGNOSIS — N184 Chronic kidney disease, stage 4 (severe): Secondary | ICD-10-CM | POA: Diagnosis not present

## 2024-04-18 DIAGNOSIS — I129 Hypertensive chronic kidney disease with stage 1 through stage 4 chronic kidney disease, or unspecified chronic kidney disease: Secondary | ICD-10-CM | POA: Diagnosis not present

## 2024-04-18 DIAGNOSIS — N184 Chronic kidney disease, stage 4 (severe): Secondary | ICD-10-CM | POA: Diagnosis not present

## 2024-04-18 DIAGNOSIS — J45909 Unspecified asthma, uncomplicated: Secondary | ICD-10-CM | POA: Diagnosis not present

## 2024-04-18 DIAGNOSIS — K219 Gastro-esophageal reflux disease without esophagitis: Secondary | ICD-10-CM | POA: Diagnosis not present

## 2024-04-18 DIAGNOSIS — D649 Anemia, unspecified: Secondary | ICD-10-CM | POA: Diagnosis not present

## 2024-04-18 DIAGNOSIS — Z981 Arthrodesis status: Secondary | ICD-10-CM | POA: Diagnosis not present

## 2024-04-18 DIAGNOSIS — Z96653 Presence of artificial knee joint, bilateral: Secondary | ICD-10-CM | POA: Diagnosis not present

## 2024-04-18 DIAGNOSIS — M1990 Unspecified osteoarthritis, unspecified site: Secondary | ICD-10-CM | POA: Diagnosis not present

## 2024-04-18 DIAGNOSIS — Z853 Personal history of malignant neoplasm of breast: Secondary | ICD-10-CM | POA: Diagnosis not present

## 2024-04-18 DIAGNOSIS — M25552 Pain in left hip: Secondary | ICD-10-CM | POA: Diagnosis not present

## 2024-04-18 DIAGNOSIS — M7502 Adhesive capsulitis of left shoulder: Secondary | ICD-10-CM | POA: Diagnosis not present

## 2024-04-18 DIAGNOSIS — E559 Vitamin D deficiency, unspecified: Secondary | ICD-10-CM | POA: Diagnosis not present

## 2024-04-18 DIAGNOSIS — M353 Polymyalgia rheumatica: Secondary | ICD-10-CM | POA: Diagnosis not present

## 2024-04-18 DIAGNOSIS — M7501 Adhesive capsulitis of right shoulder: Secondary | ICD-10-CM | POA: Diagnosis not present

## 2024-04-18 DIAGNOSIS — M25551 Pain in right hip: Secondary | ICD-10-CM | POA: Diagnosis not present

## 2024-04-18 DIAGNOSIS — Z90710 Acquired absence of both cervix and uterus: Secondary | ICD-10-CM | POA: Diagnosis not present

## 2024-04-18 DIAGNOSIS — R7303 Prediabetes: Secondary | ICD-10-CM | POA: Diagnosis not present

## 2024-04-25 DIAGNOSIS — R600 Localized edema: Secondary | ICD-10-CM | POA: Diagnosis not present

## 2024-04-25 DIAGNOSIS — D631 Anemia in chronic kidney disease: Secondary | ICD-10-CM | POA: Diagnosis not present

## 2024-04-25 DIAGNOSIS — R7303 Prediabetes: Secondary | ICD-10-CM | POA: Diagnosis not present

## 2024-04-25 DIAGNOSIS — N281 Cyst of kidney, acquired: Secondary | ICD-10-CM | POA: Diagnosis not present

## 2024-04-25 DIAGNOSIS — R809 Proteinuria, unspecified: Secondary | ICD-10-CM | POA: Diagnosis not present

## 2024-04-25 DIAGNOSIS — N184 Chronic kidney disease, stage 4 (severe): Secondary | ICD-10-CM | POA: Diagnosis not present

## 2024-04-25 DIAGNOSIS — N2581 Secondary hyperparathyroidism of renal origin: Secondary | ICD-10-CM | POA: Diagnosis not present

## 2024-04-25 DIAGNOSIS — F4325 Adjustment disorder with mixed disturbance of emotions and conduct: Secondary | ICD-10-CM | POA: Diagnosis not present

## 2024-04-25 DIAGNOSIS — R159 Full incontinence of feces: Secondary | ICD-10-CM | POA: Diagnosis not present

## 2024-04-25 DIAGNOSIS — I129 Hypertensive chronic kidney disease with stage 1 through stage 4 chronic kidney disease, or unspecified chronic kidney disease: Secondary | ICD-10-CM | POA: Diagnosis not present

## 2024-05-04 ENCOUNTER — Other Ambulatory Visit: Payer: Self-pay | Admitting: Internal Medicine

## 2024-05-06 ENCOUNTER — Other Ambulatory Visit: Payer: Self-pay | Admitting: Internal Medicine

## 2024-05-12 ENCOUNTER — Other Ambulatory Visit: Payer: Self-pay | Admitting: Physician Assistant

## 2024-05-17 DIAGNOSIS — F4325 Adjustment disorder with mixed disturbance of emotions and conduct: Secondary | ICD-10-CM | POA: Diagnosis not present

## 2024-05-18 ENCOUNTER — Ambulatory Visit: Admitting: Podiatry

## 2024-05-22 ENCOUNTER — Other Ambulatory Visit: Payer: Self-pay | Admitting: Physician Assistant

## 2024-05-26 ENCOUNTER — Ambulatory Visit: Admitting: Podiatry

## 2024-05-30 ENCOUNTER — Ambulatory Visit (INDEPENDENT_AMBULATORY_CARE_PROVIDER_SITE_OTHER): Admitting: Internal Medicine

## 2024-05-30 ENCOUNTER — Encounter: Payer: Self-pay | Admitting: Internal Medicine

## 2024-05-30 VITALS — BP 106/60 | HR 67 | Temp 97.9°F | Ht 61.0 in | Wt 151.8 lb

## 2024-05-30 DIAGNOSIS — K219 Gastro-esophageal reflux disease without esophagitis: Secondary | ICD-10-CM

## 2024-05-30 DIAGNOSIS — I10 Essential (primary) hypertension: Secondary | ICD-10-CM

## 2024-05-30 DIAGNOSIS — N184 Chronic kidney disease, stage 4 (severe): Secondary | ICD-10-CM | POA: Diagnosis not present

## 2024-05-30 DIAGNOSIS — M25552 Pain in left hip: Secondary | ICD-10-CM | POA: Insufficient documentation

## 2024-05-30 DIAGNOSIS — R269 Unspecified abnormalities of gait and mobility: Secondary | ICD-10-CM | POA: Diagnosis not present

## 2024-05-30 NOTE — Assessment & Plan Note (Signed)
 Lab Results  Component Value Date   CREATININE 2.31 (H) 02/28/2024   Stable overall, cont to avoid nephrotoxins, to f/u renal as planned

## 2024-05-30 NOTE — Assessment & Plan Note (Addendum)
 Mild to mod, for TUMS prn, antireflux precuations

## 2024-05-30 NOTE — Assessment & Plan Note (Signed)
 BP Readings from Last 3 Encounters:  05/30/24 106/60  03/31/24 110/70  02/28/24 122/72   Stable, pt to continue medical treatment norvasc  5 every day, toprol  xl 25 qd

## 2024-05-30 NOTE — Assessment & Plan Note (Signed)
 Ok for Home Health with RN, PT OT aide.

## 2024-05-30 NOTE — Patient Instructions (Signed)
 Please see Dr Joane for the left hip bursitis  Ok to use TUMS as needed for indigestion  Please continue all other medications as before, and refills have been done if requested.  Please have the pharmacy call with any other refills you may need.  Please keep your appointments with your specialists as you may have planned - nephrology in October 2025  You will be contacted regarding the referral for: Home Health with RN, aide, PT, OT  We can hold on lab testing today  Please make an Appointment to return in 6 months, or sooner if needed

## 2024-05-30 NOTE — Progress Notes (Unsigned)
 Ben Jackson D.[facility] [address] [place] [address] Phone: [phone]   Assessment and Plan:     There are no diagnoses linked to this encounter.  ***   Pertinent previous records reviewed include ***    Follow Up: ***     Subjective:   I, [name], am serving as a Neurosurgeon for Doctor Morene Mace  Chief Complaint: left thigh pain   HPI:   05/31/2024 Patient is a 88 year old female with left thigh pain. Patient states  Relevant Historical Information: ***  Additional pertinent review of systems negative.   Current Outpatient Medications:    albuterol  (VENTOLIN  HFA) 108 (90 Base) MCG/ACT inhaler, Inhale 1 puff into the lungs every 6 (six) hours as needed for wheezing or shortness of breath., Disp: 18 g, Rfl: 5   amLODipine  (NORVASC ) 5 MG tablet, TAKE 1 TABLET BY MOUTH EVERY DAY, Disp: 90 tablet, Rfl: 3   aspirin  EC 81 MG tablet, Take 81 mg by mouth every morning. Swallow whole., Disp: , Rfl:    Cholecalciferol (VITAMIN D3) 20 MCG (800 UNIT) TABS, Take 1 tablet by mouth daily., Disp: , Rfl:    cholestyramine  (QUESTRAN ) 4 g packet, TAKE 1 PACKET BY MOUTH 2 TIMES DAILY AS NEEDED (DIARRHEA) START WITH 1/2 PACK A DAY AND ADJUST AS NEEDED., Disp: 90 packet, Rfl: 0   dapagliflozin  propanediol (FARXIGA ) 10 MG TABS tablet, Take 1 tablet (10 mg total) by mouth daily before breakfast., Disp: 90 tablet, Rfl: 3   dicyclomine  (BENTYL ) 20 MG tablet, TAKE 1 TABLET (20 MG TOTAL) BY MOUTH 3 (THREE) TIMES DAILY AS NEEDED FOR SPASMS., Disp: 50 tablet, Rfl: 0   diphenoxylate -atropine  (LOMOTIL ) 2.5-0.025 MG tablet, Take 1 tablet by mouth 4 (four) times daily as needed for diarrhea or loose stools., Disp: 30 tablet, Rfl: 1   Ensure (ENSURE), Take 1 Can by mouth 3 (three) times daily between meals. (Patient taking differently: Take 237 mLs by mouth See admin instructions. Drink one can (237 mls) by mouth once or twice daily), Disp: 237 mL,  Rfl: 12   FLUAD QUADRIVALENT 0.5 ML injection, , Disp: , Rfl:    fluticasone -salmeterol (WIXELA INHUB) 500-50 MCG/ACT AEPB, Inhale 1 puff into the lungs in the morning and at bedtime., Disp: 3 each, Rfl: 3   furosemide  (LASIX ) 40 MG tablet, Take 1 tablet (40 mg total) by mouth every Monday, Wednesday, and Friday., Disp: , Rfl:    gabapentin  (NEURONTIN ) 300 MG capsule, TAKE 1 CAPSULE BY MOUTH THREE TIMES A DAY, Disp: 90 capsule, Rfl: 5   hydrocortisone  (ANUSOL -HC) 2.5 % rectal cream, Place 1 Application rectally 2 (two) times daily., Disp: 30 g, Rfl: 1   Incontinence Supply Disposable (DEPEND UNDERWEAR SM/MED) MISC, Use as directed four times per day, Disp: 120 each, Rfl: 5   Magnesium  Glycinate 120 MG CAPS, Take 2 tablets by mouth at bedtime., Disp: 90 capsule, Rfl: 3   meclizine  (ANTIVERT ) 12.5 MG tablet, TAKE 1 TABLET BY MOUTH THREE TIMES A DAY AS NEEDED FOR DIZZINESS (Patient taking differently: Take 12.5 mg by mouth 3 (three) times daily as needed for dizziness.), Disp: 90 tablet, Rfl: 2   metoprolol  succinate (TOPROL -XL) 25 MG 24 hr tablet, TAKE 1 TABLET (25 MG TOTAL) BY MOUTH DAILY., Disp: 90 tablet, Rfl: 3   montelukast  (SINGULAIR ) 10 MG tablet, TAKE 1 TABLET BY MOUTH EVERY DAY, Disp: 90 tablet, Rfl: 2   Multiple Vitamin (MULTIVITAMIN WITH MINERALS) TABS tablet, Take 1  tablet by mouth daily. Centrum Silver, Disp: , Rfl:    pantoprazole  (PROTONIX ) 40 MG tablet, TAKE 1 TABLET BY MOUTH EVERY DAY, Disp: 90 tablet, Rfl: 3   polyethylene glycol powder (GLYCOLAX /MIRALAX ) 17 GM/SCOOP powder, Take 17 g by mouth daily., Disp: 510 g, Rfl: 1   potassium chloride  (KLOR-CON ) 10 MEQ tablet, TAKE 1 TABLET BY MOUTH EVERY DAY WHEN TAKING FUROSEMIDE  (Patient taking differently: Take 10 mEq by mouth every Monday, Wednesday, and Friday.), Disp: 90 tablet, Rfl: 3   predniSONE  (DELTASONE ) 5 MG tablet, Take 1 tablet (5 mg total) by mouth daily with breakfast., Disp: 90 tablet, Rfl: 3   SPIKEVAX syringe, , Disp: ,  Rfl:    Objective:     There were no vitals filed for this visit.    There is no height or weight on file to calculate BMI.    Physical Exam:    ***   Electronically signed by:  Odis Mace D[facility] 11:40 AM 05/30/24

## 2024-05-30 NOTE — Progress Notes (Signed)
 Patient ID: [name], female   DOB: [birth year], 88 y.o.   MRN: [id number]        Chief Complaint: follow up gait disorder, gerd, left hip bursitis, and CKD4       HPI:  [name] is a 88 y.o. female here with c/o 2 wks gradually worsening pain and soreness to the left hip area near the greater trochanter.  Does not sleep on her sides so not worse that way  Worse to walk and stand up  Pt denies chest pain, increased sob or doe, wheezing, orthopnea, PND, increased LE swelling, palpitations, dizziness or syncope.   Pt denies polydipsia, polyuria, or new focal neuro s/s.    Pt denies fever, wt loss, night sweats, loss of appetite, or other constitutional symptoms  No other worsening pain,  To see podiatry soon  Has had mild intermittent worsening reflux at bedtime, but no abd pain, dysphagia, n/v, bowel change or blood.Has renal f/u oct 2025 but pt already states she is not interested in dialysis.  Does have worsening general weakness and gait difficutly, asking for Home Health Wt Readings from Last 3 Encounters:  05/30/24 151 lb 12.8 oz (68.9 kg)  03/31/24 152 lb (68.9 kg)  02/28/24 147 lb (66.7 kg)   BP Readings from Last 3 Encounters:  05/30/24 106/60  03/31/24 110/70  02/28/24 122/72         Past Medical History:  Diagnosis Date   Allergic rhinitis 10/30/2016   Anemia    Asthma    Breast cancer of upper-outer quadrant of left female breast (HCC) 11/08/2013   ER/PR+ Her2- Left IDC    Chronic renal insufficiency    Chronic rhinitis    Colon polyp    Diastolic dysfunction 07/10/2016   DJD (degenerative joint disease)    Dyspnea    Frozen shoulder    Full dentures    GERD (gastroesophageal reflux disease)    Hearing loss    Hypertension    Hyponatremia    Impaired glucose tolerance 07/18/2014   Memory loss    Morbid obesity (HCC)    Poor circulation    Vertigo    Wears glasses    Past Surgical History:  Procedure Laterality Date   ABDOMINAL HYSTERECTOMY      BREAST LUMPECTOMY WITH NEEDLE LOCALIZATION AND AXILLARY SENTINEL LYMPH NODE BX Left 12/04/2013   Procedure: BREAST LUMPECTOMY WITH NEEDLE LOCALIZATION AND AXILLARY SENTINEL LYMPH NODE BX;  Surgeon: Alm VEAR Angle, MD;  Location: [facility];  Service: General;  Laterality: Left;   CATARACT EXTRACTION  2009   rt   COLONOSCOPY     EYE SURGERY Bilateral    cataract surgery   KNEE ARTHROSCOPY     both   LUMBAR LAMINECTOMY/DECOMPRESSION MICRODISCECTOMY Left 12/23/2017   Procedure: Left Lumbar One-Two Laminectomy with microdiscectomy;  Surgeon: Joshua Alm RAMAN, MD;  Location: [facility];  Service: Neurosurgery;  Laterality: Left;  Left L1-2 Laminectomy with microdiscectomy   TONSILLECTOMY     TOTAL KNEE ARTHROPLASTY  2002   rt   TOTAL KNEE ARTHROPLASTY  2003   left   VESICOVAGINAL FISTULA CLOSURE W/ TAH  1980    reports that she has never smoked. She has never been exposed to tobacco smoke. She has never used smokeless tobacco. She reports that she does not drink alcohol and does not use drugs. family history includes Colon cancer in her mother; Healthy in her daughter; Heart attack (age of onset: 20) in her son;  Lung cancer in her brother; Stomach cancer in her mother. Allergies  Allergen Reactions   Hydrocodone  Itching   Hydrocodone -Acetaminophen  Other (See Comments)   Lasix  [Furosemide ] Other (See Comments)    Dizziness.    Tizanidine  Other (See Comments)    Dizzy and fall   Tizanidine  Hcl Other (See Comments)   Iron  Hives and Other (See Comments)     bad constipation    Current Outpatient Medications on File Prior to Visit  Medication Sig Dispense Refill   albuterol  (VENTOLIN  HFA) 108 (90 Base) MCG/ACT inhaler Inhale 1 puff into the lungs every 6 (six) hours as needed for wheezing or shortness of breath. 18 g 5   amLODipine  (NORVASC ) 5 MG tablet TAKE 1 TABLET BY MOUTH EVERY DAY 90 tablet 3   aspirin  EC 81 MG tablet Take 81 mg by mouth every morning. Swallow whole.      Cholecalciferol (VITAMIN D3) 20 MCG (800 UNIT) TABS Take 1 tablet by mouth daily.     cholestyramine  (QUESTRAN ) 4 g packet TAKE 1 PACKET BY MOUTH 2 TIMES DAILY AS NEEDED (DIARRHEA) START WITH 1/2 PACK A DAY AND ADJUST AS NEEDED. 90 packet 0   dapagliflozin  propanediol (FARXIGA ) 10 MG TABS tablet Take 1 tablet (10 mg total) by mouth daily before breakfast. 90 tablet 3   dicyclomine  (BENTYL ) 20 MG tablet TAKE 1 TABLET (20 MG TOTAL) BY MOUTH 3 (THREE) TIMES DAILY AS NEEDED FOR SPASMS. 50 tablet 0   diphenoxylate -atropine  (LOMOTIL ) 2.5-0.025 MG tablet Take 1 tablet by mouth 4 (four) times daily as needed for diarrhea or loose stools. 30 tablet 1   Ensure (ENSURE) Take 1 Can by mouth 3 (three) times daily between meals. (Patient taking differently: Take 237 mLs by mouth See admin instructions. Drink one can (237 mls) by mouth once or twice daily) 237 mL 12   FLUAD QUADRIVALENT 0.5 ML injection      fluticasone -salmeterol (WIXELA INHUB) 500-50 MCG/ACT AEPB Inhale 1 puff into the lungs in the morning and at bedtime. 3 each 3   furosemide  (LASIX ) 40 MG tablet Take 1 tablet (40 mg total) by mouth every Monday, Wednesday, and Friday.     gabapentin  (NEURONTIN ) 300 MG capsule TAKE 1 CAPSULE BY MOUTH THREE TIMES A DAY 90 capsule 5   hydrocortisone  (ANUSOL -HC) 2.5 % rectal cream Place 1 Application rectally 2 (two) times daily. 30 g 1   Incontinence Supply Disposable (DEPEND UNDERWEAR SM/MED) MISC Use as directed four times per day 120 each 5   Magnesium  Glycinate 120 MG CAPS Take 2 tablets by mouth at bedtime. 90 capsule 3   meclizine  (ANTIVERT ) 12.5 MG tablet TAKE 1 TABLET BY MOUTH THREE TIMES A DAY AS NEEDED FOR DIZZINESS (Patient taking differently: Take 12.5 mg by mouth 3 (three) times daily as needed for dizziness.) 90 tablet 2   metoprolol  succinate (TOPROL -XL) 25 MG 24 hr tablet TAKE 1 TABLET (25 MG TOTAL) BY MOUTH DAILY. 90 tablet 3   montelukast  (SINGULAIR ) 10 MG tablet TAKE 1 TABLET BY MOUTH EVERY DAY  90 tablet 2   Multiple Vitamin (MULTIVITAMIN WITH MINERALS) TABS tablet Take 1 tablet by mouth daily. Centrum Silver     pantoprazole  (PROTONIX ) 40 MG tablet TAKE 1 TABLET BY MOUTH EVERY DAY 90 tablet 3   polyethylene glycol powder (GLYCOLAX /MIRALAX ) 17 GM/SCOOP powder Take 17 g by mouth daily. 510 g 1   potassium chloride  (KLOR-CON ) 10 MEQ tablet TAKE 1 TABLET BY MOUTH EVERY DAY WHEN TAKING FUROSEMIDE  (Patient taking differently: Take  10 mEq by mouth every Monday, Wednesday, and Friday.) 90 tablet 3   predniSONE  (DELTASONE ) 5 MG tablet Take 1 tablet (5 mg total) by mouth daily with breakfast. 90 tablet 3   SPIKEVAX syringe      No current facility-administered medications on file prior to visit.        ROS:  All others reviewed and negative.  Objective        PE:  BP 106/60   Pulse 67   Temp 97.9 F (36.6 C)   Ht 5' 1 (1.549 m)   Wt 151 lb 12.8 oz (68.9 kg)   SpO2 96%   BMI 28.68 kg/m                 Constitutional: Pt appears in NAD               HENT: Head: NCAT.                Right Ear: External ear normal.                 Left Ear: External ear normal.                Eyes: . Pupils are equal, round, and reactive to light. Conjunctivae and EOM are normal               Nose: without d/c or deformity               Neck: Neck supple. Gross normal ROM               Cardiovascular: Normal rate and regular rhythm.                 Pulmonary/Chest: Effort normal and breath sounds without rales or wheezing.                Abd:  Soft, NT, ND, + BS, no organomegaly               Neurological: Pt is alert. At baseline orientation, motor grossly intact; tender sore over left greater trochanter               Skin: Skin is warm. No rashes, no other new lesions, LE edema - none               Psychiatric: Pt behavior is normal without agitation   Micro: none  Cardiac tracings I have personally interpreted today:  none  Pertinent Radiological findings (summarize): none   Lab Results   Component Value Date   WBC 8.6 03/24/2024   HGB 11.8 (L) 03/24/2024   HCT 35.8 (L) 03/24/2024   PLT 222.0 03/24/2024   GLUCOSE 120 (H) 02/28/2024   CHOL 219 (H) 08/24/2023   TRIG 153.0 (H) 08/24/2023   HDL 57.90 08/24/2023   LDLDIRECT 137.0 02/02/2022   LDLCALC 131 (H) 08/24/2023   ALT 17 02/28/2024   AST 23 02/28/2024   NA 141 02/28/2024   K 3.8 02/28/2024   CL 94 (L) 02/28/2024   CREATININE 2.31 (H) 02/28/2024   BUN 58 (H) 02/28/2024   CO2 32 02/28/2024   TSH 1.09 06/25/2023   INR 1.0 07/27/2021   HGBA1C 6.4 08/24/2023   Assessment/Plan:  [name] is a 88 y.o. Black or African American [2] female with  has a past medical history of Allergic rhinitis (10/30/2016), Anemia, Asthma, Breast cancer of upper-outer quadrant of left female breast (HCC) (11/08/2013), Chronic renal insufficiency, Chronic rhinitis,  Colon polyp, Diastolic dysfunction (07/10/2016), DJD (degenerative joint disease), Dyspnea, Frozen shoulder, Full dentures, GERD (gastroesophageal reflux disease), Hearing loss, Hypertension, Hyponatremia, Impaired glucose tolerance (07/18/2014), Memory loss, Morbid obesity (HCC), Poor circulation, Vertigo, and Wears glasses.  Gastroesophageal reflux disease Mild to mod, for TUMS prn, antireflux precuations  Essential hypertension BP Readings from Last 3 Encounters:  05/30/24 106/60  03/31/24 110/70  02/28/24 122/72   Stable, pt to continue medical treatment norvasc  5 every day, toprol  xl 25 qd   CKD (chronic kidney disease) stage 4, GFR 15-29 ml/min (HCC) Lab Results  Component Value Date   CREATININE 2.31 (H) 02/28/2024   Stable overall, cont to avoid nephrotoxins, to f/u renal as planned  Left hip pain Exam c/w likely left hip bursitis  - pt encouraged to make appt with Sports Medicine today for possble cortisone  Gait disorder Ok for Home Health with RN, PT OT aide.    Followup: Return in about 6 months (around 11/30/2024).  Lynwood Rush, MD  05/30/2024 10:46 AM [facility]

## 2024-05-30 NOTE — Assessment & Plan Note (Signed)
 Exam c/w likely left hip bursitis  - pt encouraged to make appt with Sports Medicine today for possble cortisone

## 2024-05-31 ENCOUNTER — Ambulatory Visit: Admitting: Sports Medicine

## 2024-05-31 VITALS — BP 106/72 | HR 78 | Ht 61.0 in | Wt 151.0 lb

## 2024-05-31 DIAGNOSIS — G8929 Other chronic pain: Secondary | ICD-10-CM | POA: Diagnosis not present

## 2024-05-31 DIAGNOSIS — M25552 Pain in left hip: Secondary | ICD-10-CM

## 2024-05-31 DIAGNOSIS — M7062 Trochanteric bursitis, left hip: Secondary | ICD-10-CM

## 2024-05-31 NOTE — Patient Instructions (Signed)
 As needed follow up if no improvement 3-4 week follow up

## 2024-06-02 ENCOUNTER — Ambulatory Visit: Admitting: Podiatry

## 2024-06-02 ENCOUNTER — Encounter: Payer: Self-pay | Admitting: Podiatry

## 2024-06-02 DIAGNOSIS — B351 Tinea unguium: Secondary | ICD-10-CM

## 2024-06-02 DIAGNOSIS — M79675 Pain in left toe(s): Secondary | ICD-10-CM | POA: Diagnosis not present

## 2024-06-02 DIAGNOSIS — M79674 Pain in right toe(s): Secondary | ICD-10-CM | POA: Diagnosis not present

## 2024-06-02 NOTE — Patient Instructions (Signed)

## 2024-06-02 NOTE — Progress Notes (Signed)
 Subjective:   Patient ID: [name], female   DOB: 88 y.o.   MRN: [id number]   HPI Patient presents with caregiver with a lot of discomfort around her toenails with thick tight skin formation on the medial side of each hallux which is very irritated and sore   ROS      Objective:  Physical Exam  Neurovascular status intact with patient's nails found to be incurvated in the corners and tissue which forms on the medial side of each big toe which gets aggravated left over right     Assessment:  Chronic mycotic nail infection 1-5 both feet with irritated skin on the medial side hallux bilateral     Plan:  Debrided painful nailbeds 1-5 both feet and skin and the skin on the left there was a slight break in tissue as the skin was very thin.  I did apply some Surgicel and compression dressing stopping the bleeding and I explained for soaks to commence in 2 days and if any issues were to occur to please let me know

## 2024-06-06 ENCOUNTER — Telehealth: Payer: Self-pay

## 2024-06-06 NOTE — Telephone Encounter (Signed)
 Copied from CRM [id number]. Topic: Clinical - Medical Advice >> Jun 06, 2024  9:54 AM Antwanette L wrote: Reason for CRM: Patient spoke w/ [facility] yesterday and they told her she qualifies for  nurse aid to come out and give her a bath. [employer] just needs Dr. Norleen so send up a letter of approval. The patient will like to use [facility]. The patient can contacted at [phone]

## 2024-06-09 NOTE — Telephone Encounter (Signed)
 Called and spoke with the patient to get more information about what was needed from Dr. Norleen and she really didn't know. SO I called the agency that she's been paying privately to give her baths and they advised me that they do not work with medicare so the patient would have to continue being private pay or medicare would have to pay her and she would have to pay them. [employer] [phone]). I called and advised the patient that she would have to reach out to Medicare to make sure that they would pay her for her to continue paying [facility] or she could see which home health agency that medicare would cover so a new aid can come out and not only wash her but help her if she needs it and her insurance would cover it. The patient and her daughter gave a verbal understanding and stated they would call us  back next week.

## 2024-06-15 ENCOUNTER — Telehealth: Payer: Self-pay

## 2024-06-15 NOTE — Telephone Encounter (Signed)
 Copied from CRM [id number]. Topic: Clinical - Medical Advice >> Jun 06, 2024  9:54 AM Antwanette L wrote: Reason for CRM: Patient spoke w/ [facility] yesterday and they told her she qualifies for  nurse aid to come out and give her a bath. [employer] just needs Dr. Norleen so send up a letter of approval. The patient will like to use [facility]. The patient can contacted at [phone] >> Jun 15, 2024 12:12 PM Armenia J wrote: Patient's daughter called [employer] to verify benefits. [employer] stated that they are able to cover the patient for Home Health services but, they are needing Dr Norleen to submit a prior authorization on the patient's behalf. [employer] is also asking if Dr Norleen could specify whether or not the patient is needing medical equipment at the time of her Home Health services.  *[employer] can cover up to 35 hours a week of skilled nursing, with the nurse in home 8 hours a day.* [employer]: [phone]   Please reach out to Shona Gavel at [phone] with an update. She has to be at a funeral today but will be available after 3:00 PM today.

## 2024-06-20 ENCOUNTER — Other Ambulatory Visit: Payer: Self-pay | Admitting: Physician Assistant

## 2024-06-20 ENCOUNTER — Encounter: Payer: Medicare HMO | Admitting: Physical Medicine and Rehabilitation

## 2024-06-21 NOTE — Telephone Encounter (Unsigned)
 Copied from CRM #[id number]. Topic: General - Other >> Jun 21, 2024  9:44 AM [name] I wrote: Reason for CRM: Patient needs a letter from Dr.John sent to [employer] for in home services. Patient has contacted about this before but [employer] has still not received a letter. [name] stated she qualifies for 35 hours but she needs an approval letter from doctor.

## 2024-06-21 NOTE — Telephone Encounter (Signed)
Being addressed in separate note.

## 2024-06-21 NOTE — Telephone Encounter (Signed)
 Called patient's daughter back to inform her that we are working on this and provide an update. Will be calling them back before end of day with more information

## 2024-06-22 ENCOUNTER — Telehealth: Payer: Self-pay

## 2024-06-22 NOTE — Telephone Encounter (Signed)
 Copied from CRM [id number]. Topic: General - Call Back - No Documentation >> Jun 22, 2024  2:17 PM Shereese L wrote: Reason for CRM: patient is calling in to return office call from zachary. Advised patient that he's not avail and he would give her a call back

## 2024-06-22 NOTE — Telephone Encounter (Signed)
 Attempted to call [employer] to get more information regarding what was needed on our side. By the end of the call they were recommending to reach out to the customer service line and have patient on line as well to speak with them. Called and spoke with patient's daughter as well. Will attempt to call patient around 1:15 today to try to coordinate a call with her and the insurance

## 2024-06-22 NOTE — Telephone Encounter (Signed)
 [employer] customer line and was given similar answer as provider line. [employer] is really just requiring a referral to be placed to the facility of patient's choice that is in network.  Attempted to call patient back to inform her of this as well as to get a facility name to send the referral to but I had to leave a voice message.

## 2024-06-22 NOTE — Telephone Encounter (Signed)
 Have since called patient back, addressing in separate telephone note

## 2024-06-23 ENCOUNTER — Other Ambulatory Visit: Payer: Self-pay | Admitting: Internal Medicine

## 2024-06-27 ENCOUNTER — Ambulatory Visit (INDEPENDENT_AMBULATORY_CARE_PROVIDER_SITE_OTHER): Payer: Medicare HMO

## 2024-06-27 VITALS — Ht 65.0 in | Wt 151.0 lb

## 2024-06-27 DIAGNOSIS — I5032 Chronic diastolic (congestive) heart failure: Secondary | ICD-10-CM

## 2024-06-27 DIAGNOSIS — Z0000 Encounter for general adult medical examination without abnormal findings: Secondary | ICD-10-CM

## 2024-06-27 DIAGNOSIS — I130 Hypertensive heart and chronic kidney disease with heart failure and stage 1 through stage 4 chronic kidney disease, or unspecified chronic kidney disease: Secondary | ICD-10-CM

## 2024-06-27 NOTE — Progress Notes (Signed)
 Subjective:   [name] is a 88 y.o. who presents for a Medicare Wellness preventive visit.  As a reminder, Annual Wellness Visits don't include a physical exam, and some assessments may be limited, especially if this visit is performed virtually. We may recommend an in-person follow-up visit with your provider if needed.  Visit Complete: Virtual I connected with  [name] on 06/27/24 by a audio enabled telemedicine application and verified that I am speaking with the correct person using two identifiers.  Patient Location: Home  Provider Location: Office/Clinic  I discussed the limitations of evaluation and management by telemedicine. The patient expressed understanding and agreed to proceed.  Vital Signs: Because this visit was a virtual/telehealth visit, some criteria may be missing or patient reported. Any vitals not documented were not able to be obtained and vitals that have been documented are patient reported.  VideoDeclined- This patient declined Librarian, academic. Therefore the visit was completed with audio only.  Persons Participating in Visit: Patient assisted by Daughter, Brielynn Sekula.  AWV Questionnaire: No: Patient Medicare AWV questionnaire was not completed prior to this visit.  Cardiac Risk Factors include: advanced age (>91men, >34 women);dyslipidemia;hypertension     Objective:    Today's Vitals   06/27/24 1356  Weight: 151 lb (68.5 kg)  Height: 5' 5 (1.651 m)   Body mass index is 25.13 kg/m.     06/27/2024    2:30 PM 02/17/2024    1:47 PM 02/14/2024   12:00 AM 02/13/2024   11:23 AM 06/23/2023    1:40 PM 06/08/2022    4:04 PM 04/23/2022   10:08 AM  Advanced Directives  Does Patient Have a Medical Advance Directive? No Yes Yes Yes Yes Yes Yes  Type of Forensic scientist Power of [employer] Power of [place];Living will  Healthcare Power of [place];Living will Living will  Does patient want to make changes to medical advance directive?   No - Patient declined No - Patient declined  No - Patient declined   Copy of Healthcare Power of Attorney in Chart?   No - copy requested  No - copy requested No - copy requested   Would patient like information on creating a medical advance directive? No - Patient declined          Current Medications (verified) Outpatient Encounter Medications as of 06/27/2024  Medication Sig   albuterol  (VENTOLIN  HFA) 108 (90 Base) MCG/ACT inhaler Inhale 1 puff into the lungs every 6 (six) hours as needed for wheezing or shortness of breath.   amLODipine  (NORVASC ) 5 MG tablet TAKE 1 TABLET BY MOUTH EVERY DAY   aspirin  EC 81 MG tablet Take 81 mg by mouth every morning. Swallow whole.   Cholecalciferol (VITAMIN D3) 20 MCG (800 UNIT) TABS Take 1 tablet by mouth daily.   cholestyramine  (QUESTRAN ) 4 g packet TAKE 1 PACKET BY MOUTH 2 TIMES DAILY AS NEEDED (DIARRHEA) START WITH 1/2 PACK A DAY AND ADJUST AS NEEDED.   dapagliflozin  propanediol (FARXIGA ) 10 MG TABS tablet Take 1 tablet (10 mg total) by mouth daily before breakfast.   dicyclomine  (BENTYL ) 20 MG tablet TAKE 1 TABLET (20 MG TOTAL) BY MOUTH 3 (THREE) TIMES DAILY AS NEEDED FOR SPASMS.   diphenoxylate -atropine  (LOMOTIL ) 2.5-0.025 MG tablet Take 1 tablet by mouth 4 (four) times daily as needed for diarrhea or loose stools.   Ensure (ENSURE) Take 1 Can by mouth 3 (three) times  daily between meals. (Patient taking differently: Take 237 mLs by mouth See admin instructions. Drink one can (237 mls) by mouth once or twice daily)   FLUAD QUADRIVALENT 0.5 ML injection    furosemide  (LASIX ) 40 MG tablet Take 1 tablet (40 mg total) by mouth every Monday, Wednesday, and Friday.   gabapentin  (NEURONTIN ) 300 MG capsule TAKE 1 CAPSULE BY MOUTH THREE TIMES A DAY   hydrocortisone  (ANUSOL -HC) 2.5 % rectal cream Place 1 Application rectally 2 (two) times daily.    Incontinence Supply Disposable (DEPEND UNDERWEAR SM/MED) MISC Use as directed four times per day   Magnesium  Glycinate 120 MG CAPS Take 2 tablets by mouth at bedtime.   meclizine  (ANTIVERT ) 12.5 MG tablet TAKE 1 TABLET BY MOUTH THREE TIMES A DAY AS NEEDED FOR DIZZINESS (Patient taking differently: Take 12.5 mg by mouth 3 (three) times daily as needed for dizziness.)   metoprolol  succinate (TOPROL -XL) 25 MG 24 hr tablet TAKE 1 TABLET (25 MG TOTAL) BY MOUTH DAILY.   montelukast  (SINGULAIR ) 10 MG tablet TAKE 1 TABLET BY MOUTH EVERY DAY   Multiple Vitamin (MULTIVITAMIN WITH MINERALS) TABS tablet Take 1 tablet by mouth daily. Centrum Silver   pantoprazole  (PROTONIX ) 40 MG tablet TAKE 1 TABLET BY MOUTH EVERY DAY   polyethylene glycol powder (GLYCOLAX /MIRALAX ) 17 GM/SCOOP powder Take 17 g by mouth daily.   potassium chloride  (KLOR-CON ) 10 MEQ tablet TAKE 1 TABLET BY MOUTH EVERY DAY WHEN TAKING FUROSEMIDE  (Patient taking differently: Take 10 mEq by mouth every Monday, Wednesday, and Friday.)   predniSONE  (DELTASONE ) 5 MG tablet Take 1 tablet (5 mg total) by mouth daily with breakfast.   SPIKEVAX syringe    WIXELA INHUB 500-50 MCG/ACT AEPB INHALE 1 PUFF INTO THE LUNGS IN THE MORNING AND AT BEDTIME.   No facility-administered encounter medications on file as of 06/27/2024.    Allergies (verified) Hydrocodone , Hydrocodone -acetaminophen , Lasix  [furosemide ], Tizanidine , Tizanidine  hcl, and Iron    History: Past Medical History:  Diagnosis Date   Allergic rhinitis 10/30/2016   Anemia    Asthma    Breast cancer of upper-outer quadrant of left female breast (HCC) 11/08/2013   ER/PR+ Her2- Left IDC    Chronic renal insufficiency    Chronic rhinitis    Colon polyp    Diastolic dysfunction 07/10/2016   DJD (degenerative joint disease)    Dyspnea    Frozen shoulder    Full dentures    GERD (gastroesophageal reflux disease)    Hearing loss    Hypertension    Hyponatremia    Impaired glucose  tolerance 07/18/2014   Memory loss    Morbid obesity (HCC)    Poor circulation    Vertigo    Wears glasses    Past Surgical History:  Procedure Laterality Date   ABDOMINAL HYSTERECTOMY     BREAST LUMPECTOMY WITH NEEDLE LOCALIZATION AND AXILLARY SENTINEL LYMPH NODE BX Left 12/04/2013   Procedure: BREAST LUMPECTOMY WITH NEEDLE LOCALIZATION AND AXILLARY SENTINEL LYMPH NODE BX;  Surgeon: Alm VEAR Angle, MD;  Location: [facility];  Service: General;  Laterality: Left;   CATARACT EXTRACTION  2009   rt   COLONOSCOPY     EYE SURGERY Bilateral    cataract surgery   KNEE ARTHROSCOPY     both   LUMBAR LAMINECTOMY/DECOMPRESSION MICRODISCECTOMY Left 12/23/2017   Procedure: Left Lumbar One-Two Laminectomy with microdiscectomy;  Surgeon: Joshua Alm RAMAN, MD;  Location[facility];  Service: Neurosurgery;  Laterality: Left;  Left L1-2 Laminectomy with microdiscectomy  TONSILLECTOMY     TOTAL KNEE ARTHROPLASTY  2002   rt   TOTAL KNEE ARTHROPLASTY  2003   left   VESICOVAGINAL FISTULA CLOSURE W/ TAH  1980   Family History  Problem Relation Age of Onset   Colon cancer Mother        in her 61's   Stomach cancer Mother    Lung cancer Brother        was a smoker   Heart attack Son 7   Healthy Daughter    Social History   Socioeconomic History   Marital status: Widowed    Spouse name: Not on file   Number of children: 2   Years of education: Not on file   Highest education level: Not on file  Occupational History   Occupation: owns bakery and works PT for [employer]  Tobacco Use   Smoking status: Never    Passive exposure: Never   Smokeless tobacco: Never  Vaping Use   Vaping status: Never Used  Substance and Sexual Activity   Alcohol use: No    Alcohol/week: 0.0 standard drinks of alcohol   Drug use: No   Sexual activity: Not Currently  Other Topics Concern   Not on file  Social History Narrative   Widowed    Social Drivers of Health   Financial Resource  Strain: Low Risk  (06/27/2024)   Overall Financial Resource Strain (CARDIA)    Difficulty of Paying Living Expenses: Not hard at all  Food Insecurity: No Food Insecurity (06/27/2024)   Hunger Vital Sign    Worried About Running Out of Food in the Last Year: Never true    Ran Out of Food in the Last Year: Never true  Transportation Needs: No Transportation Needs (06/27/2024)   PRAPARE - Administrator, Civil Service (Medical): No    Lack of Transportation (Non-Medical): No  Physical Activity: Inactive (06/27/2024)   Exercise Vital Sign    Days of Exercise per Week: 0 days    Minutes of Exercise per Session: 0 min  Stress: No Stress Concern Present (06/27/2024)   [employer] of Occupational Health - Occupational Stress Questionnaire    Feeling of Stress: Not at all  Social Connections: Moderately Integrated (06/27/2024)   Social Connection and Isolation Panel    Frequency of Communication with Friends and Family: More than three times a week    Frequency of Social Gatherings with Friends and Family: More than three times a week    Attends Religious Services: More than 4 times per year    Active Member of [employer] or Organizations: Yes    Attends Banker Meetings: More than 4 times per year    Marital Status: Widowed    Tobacco Counseling Counseling given: Not Answered    Clinical Intake:  Pre-visit preparation completed: Yes  Pain : No/denies pain     BMI - recorded: 25.13 Nutritional Status: BMI 25 -29 Overweight Nutritional Risks: None Diabetes: No  Lab Results  Component Value Date   HGBA1C 6.4 08/24/2023   HGBA1C 6.0 06/25/2023   HGBA1C 5.7 (A) 09/29/2022     How often do you need to have someone help you when you read instructions, pamphlets, or other written materials from your doctor or pharmacy?: 5 - Always (Dtr helps)  Interpreter Needed?: No  Information entered by :: Verdie Saba, CMA   Activities of Daily Living     06/27/2024     2:02 PM 02/14/2024  12:00 AM  In your present state of health, do you have any difficulty performing the following activities:  Hearing? 0 1  Comment wears hearing aids   Vision? 0 0  Difficulty concentrating or making decisions? 0 1  Comment Dtr helps   Walking or climbing stairs? 1   Comment uses a walker   Dressing or bathing? 1   Comment Caregiver helps   Doing errands, shopping? 1 0  Comment Dtr helps   Preparing Food and eating ? N   Using the Toilet? N   In the past six months, have you accidently leaked urine? Y   Comment wears a depend   Do you have problems with loss of bowel control? Y   Comment wears a depend   Managing your Medications? Y   Comment Caregiver/Dtr helps   Managing your Finances? Y   Comment Caregiver helps   Housekeeping or managing your Housekeeping? Y   Comment Caregiver helps     Patient Care Team: [name], MD as PCP - General (Internal Medicine) Keenan Hastings, MD as Consulting Physician (Radiation Oncology) Leva Norleen, MD as Consulting Physician (Obstetrics and Gynecology) Wilkie Majel RAMAN, FNP as Nurse Practitioner (Psychiatry) Ardeen Rollene Kass, MD as Consulting Physician (Radiology) [facility] as Consulting Physician (Ophthalmology)  I have updated your Care Teams any recent Medical Services you may have received from other providers in the past year.     Assessment:   This is a routine wellness examination for Jahyra.  Hearing/Vision screen Hearing Screening - Comments:: Wears hearing aids Vision Screening - Comments:: Wears reading eyeglasses only Dr Octavia   Goals Addressed               This Visit's Progress     Patient Stated (pt-stated)        Patient stated she plans to continue to watch diet daily but should also move around more       Depression Screen     06/27/2024    2:06 PM 05/30/2024   10:11 AM 02/22/2024    1:10 PM 02/01/2024    3:55 PM 12/24/2023   10:15 AM  08/24/2023    1:19 PM 08/02/2023    2:05 PM  PHQ 2/9 Scores  PHQ - 2 Score 0 0 0 0 0 0 0  PHQ- 9 Score 3     0 0    Fall Risk     06/27/2024    2:05 PM 05/30/2024   10:11 AM 02/22/2024    1:09 PM 02/01/2024    4:14 PM 01/12/2024    4:12 PM  Fall Risk   Falls in the past year? 1 0 0 1 0  Number falls in past yr: 0 0 0 0 0  Comment 1      Injury with Fall? 0 0 0 0 0  Risk for fall due to : History of fall(s);Impaired balance/gait  No Fall Risks History of fall(s)   Follow up Falls evaluation completed;Falls prevention discussed  Falls evaluation completed Falls evaluation completed Falls evaluation completed    MEDICARE RISK AT HOME:  Medicare Risk at Home Any stairs in or around the home?: No If so, are there any without handrails?: No Home free of loose throw rugs in walkways, pet beds, electrical cords, etc?: Yes Adequate lighting in your home to reduce risk of falls?: Yes Life alert?: Yes (necklace) Use of a cane, walker or w/c?: Yes (uses a walker) Grab bars in the  bathroom?: No Shower chair or bench in shower?: Yes Elevated toilet seat or a handicapped toilet?: Yes  TIMED UP AND GO:  Was the test performed?  No  Cognitive Function: 6CIT completed    08/17/2017    8:54 AM  MMSE - Mini Mental State Exam  Orientation to time 5   Orientation to Place 5   Registration 3   Attention/ Calculation 3   Recall 2   Language- name 2 objects 2   Language- repeat 1  Language- follow 3 step command 3   Language- read & follow direction 1   Write a sentence 1   Copy design 1   Total score 27      Data saved with a previous flowsheet row definition        06/27/2024    2:10 PM 06/23/2023    1:46 PM 05/29/2022   11:45 AM  6CIT Screen  What Year? 0 points 0 points 0 points  What month? 0 points 0 points 0 points  What time? 0 points 0 points 0 points  Count back from 20 0 points 0 points 0 points  Months in reverse 0 points 0 points 0 points  Repeat phrase 0 points 0 points  0 points  Total Score 0 points 0 points 0 points    Immunizations Immunization History  Administered Date(s) Administered   Fluad Quad(high Dose 65+) 11/27/2020, 06/27/2021, 06/30/2022, 07/11/2023   INFLUENZA, HIGH DOSE SEASONAL PF 07/10/2016, 08/10/2017, 08/30/2018, 09/08/2019   Influenza Split 08/07/2011, 10/13/2012   Influenza Whole 07/27/2008, 08/14/2009, 07/01/2010   Influenza,inj,Quad PF,6+ Mos 08/28/2013, 07/18/2014, 07/26/2015   PFIZER(Purple Top)SARS-COV-2 Vaccination 12/11/2019, 01/02/2020, 01/23/2020   Pfizer Covid-19 Vaccine Bivalent Booster 40yrs & up 11/03/2021   Pneumococcal Conjugate-13 02/20/2014   Pneumococcal Polysaccharide-23 10/26/2005   RSV,unspecified 01/13/2024   Tdap 11/22/2017   Unspecified SARS-COV-2 Vaccination 08/03/2022   Zoster Recombinant(Shingrix) 01/13/2024    Screening Tests Health Maintenance  Topic Date Due   Zoster Vaccines- Shingrix (2 of 2) 03/09/2024   INFLUENZA VACCINE  05/26/2024   Medicare Annual Wellness (AWV)  06/27/2025   DTaP/Tdap/Td (2 - Td or Tdap) 11/23/2027   Pneumococcal Vaccine: 50+ Years  Completed   DEXA SCAN  Completed   HPV VACCINES  Aged Out   Meningococcal B Vaccine  Aged Out   COVID-19 Vaccine  Discontinued    Health Maintenance  Health Maintenance Due  Topic Date Due   Zoster Vaccines- Shingrix (2 of 2) 03/09/2024   INFLUENZA VACCINE  05/26/2024   Health Maintenance Items Addressed: 06/27/2024  Additional Screening:  Vision Screening: Recommended annual ophthalmology exams for early detection of glaucoma and other disorders of the eye. Would you like a referral to an eye doctor? No    Dental Screening: Recommended annual dental exams for proper oral hygiene  Community Resource Referral / Chronic Care Management: CRR required this visit?  Yes - VBCI referral for assistance w/daily activities [facility] request)  CCM required this visit?  No   Plan:    I have personally reviewed and noted the following in  the patient's chart:   Medical and social history Use of alcohol, tobacco or illicit drugs  Current medications and supplements including opioid prescriptions. Patient is not currently taking opioid prescriptions. Functional ability and status Nutritional status Physical activity Advanced directives List of other physicians Hospitalizations, surgeries, and ER visits in previous 12 months Vitals Screenings to include cognitive, depression, and falls Referrals and appointments  In addition, I have reviewed and discussed  with patient certain preventive protocols, quality metrics, and best practice recommendations. A written personalized care plan for preventive services as well as general preventive health recommendations were provided to patient.   Verdie CHRISTELLA Saba, CMA   06/27/2024   After Visit Summary: (MyChart) Due to this being a telephonic visit, the after visit summary with patients personalized plan was offered to patient via MyChart   Notes: Scheduled Physical w/PCP for 02/2025. VBCI referral for assistance w/daily activities [facility] request)

## 2024-06-27 NOTE — Patient Instructions (Signed)
 [name] , Thank you for taking time out of your busy schedule to complete your Annual Wellness Visit with me. I enjoyed our conversation and look forward to speaking with you again next year. I, as well as your care team,  appreciate your ongoing commitment to your health goals. Please review the following plan we discussed and let me know if I can assist you in the future. Your Game plan/ To Do List    Referrals: If you haven't heard from the office you've been referred to, please reach out to them at the phone provided. VBCI referral for assistance w/daily activities [facility] request)  Follow up Visits: We will see or speak with you next year for your Next Medicare AWV with our clinical staff Have you seen your provider in the last 6 months (3 months if uncontrolled diabetes)? Yes  Clinician Recommendations:  Aim for 30 minutes of exercise or brisk walking, 6-8 glasses of water, and 5 servings of fruits and vegetables each day.       This is a list of the screenings recommended for you:  Health Maintenance  Topic Date Due   Zoster (Shingles) Vaccine (2 of 2) 03/09/2024   Flu Shot  05/26/2024   Medicare Annual Wellness Visit  06/27/2025   DTaP/Tdap/Td vaccine (2 - Td or Tdap) 11/23/2027   Pneumococcal Vaccine for age over 32  Completed   DEXA scan (bone density measurement)  Completed   HPV Vaccine  Aged Out   Meningitis B Vaccine  Aged Out   COVID-19 Vaccine  Discontinued    Advanced directives: (Declined) Advance directive discussed with you today. Even though you declined this today, please call our office should you change your mind, and we can give you the proper paperwork for you to fill out. Advance Care Planning is important because it:  [x]  Makes sure you receive the medical care that is consistent with your values, goals, and preferences  [x]  It provides guidance to your family and loved ones and reduces their decisional burden about whether or not they are making the right  decisions based on your wishes.  Follow the link provided in your after visit summary or read over the paperwork we have mailed to you to help you started getting your Advance Directives in place. If you need assistance in completing these, please reach out to us  so that we can help you!

## 2024-07-03 NOTE — Telephone Encounter (Unsigned)
 Copied from CRM [id number]. Topic: General - Other >> Jul 03, 2024  1:01 PM Thersia C wrote: Reason for CRM: Patient called in wanting to speak with Zack , would like a callback as soon as possible

## 2024-07-03 NOTE — Telephone Encounter (Signed)
 Called and spoke with patient and patient's daughter. Informed them that I had tried to get a preferred provider list from their insurance company to get a new home health referral placed. Awaiting list from patient's daughter.

## 2024-07-05 ENCOUNTER — Other Ambulatory Visit: Payer: Self-pay

## 2024-07-07 ENCOUNTER — Telehealth: Payer: Self-pay | Admitting: *Deleted

## 2024-07-07 NOTE — Progress Notes (Signed)
 Complex Care Management Note Care Guide Note  07/07/2024 Name: [name] MRN: [id number] DOB: [birth year]   Complex Care Management Outreach Attempts: An unsuccessful telephone outreach was attempted today to offer the patient information about available complex care management services.  Follow Up Plan:  Additional outreach attempts will be made to offer the patient complex care management information and services.   Encounter Outcome:  Patient Request to Call Back  [name], CMA, [place]  [facility], [facility] Guide Direct Dial: [phone]  Fax: [phone] Website: [email or web address]

## 2024-07-10 NOTE — Progress Notes (Unsigned)
 Complex Care Management Note Care Guide Note  07/10/2024 Name: [name] MRN: [id number] DOB: November 26, 1935   Complex Care Management Outreach Attempts: A second unsuccessful outreach was attempted today to offer the patient with information about available complex care management services.  Follow Up Plan:  Additional outreach attempts will be made to offer the patient complex care management information and services.   Encounter Outcome:  No Answer  [name], CMA [place]  [facility], [facility] Guide Direct Dial: [phone]  Fax: [phone] Website: [email or web address]

## 2024-07-11 NOTE — Progress Notes (Signed)
 Complex Care Management Note  Care Guide Note 07/11/2024 Name: [name] MRN: [id number] DOB: [birth year]  [name] is a 88 y.o. year old female who sees [name], MD for primary care. I reached out to [name] by phone today to offer complex care management services.  [name] was given information about Complex Care Management services today including:   The Complex Care Management services include support from the care team which includes your Nurse Care Manager, Clinical Social Worker, or Pharmacist.  The Complex Care Management team is here to help remove barriers to the health concerns and goals most important to you. Complex Care Management services are voluntary, and the patient may decline or stop services at any time by request to their care team member.   Complex Care Management Consent Status: Patient agreed to services and verbal consent obtained.   Follow up plan:  Telephone appointment with complex care management team member scheduled for:  07/19/2024  Encounter Outcome:  Patient Scheduled  [name], CMA [place]  [facility], [facility] Guide Direct Dial: [phone]  Fax: [phone] Website: [email or web address]

## 2024-07-19 ENCOUNTER — Ambulatory Visit (INDEPENDENT_AMBULATORY_CARE_PROVIDER_SITE_OTHER): Admitting: Podiatry

## 2024-07-19 ENCOUNTER — Other Ambulatory Visit: Payer: Self-pay

## 2024-07-19 ENCOUNTER — Telehealth: Payer: Self-pay

## 2024-07-19 VITALS — Wt 150.0 lb

## 2024-07-19 DIAGNOSIS — Q828 Other specified congenital malformations of skin: Secondary | ICD-10-CM | POA: Diagnosis not present

## 2024-07-19 DIAGNOSIS — N184 Chronic kidney disease, stage 4 (severe): Secondary | ICD-10-CM

## 2024-07-19 DIAGNOSIS — I5032 Chronic diastolic (congestive) heart failure: Secondary | ICD-10-CM

## 2024-07-19 DIAGNOSIS — R413 Other amnesia: Secondary | ICD-10-CM

## 2024-07-19 NOTE — Telephone Encounter (Signed)
 Copied from CRM #[id number]. Topic: Referral - Question >> Jul 19, 2024  9:39 AM Mesmerise C wrote: Reason for CRM: Patient's daughter Garvin inquiring about getting a referral for a neurologist doesn't know where she wants it to be sent and doesn't want to make patient aware of her concerns so doesn't want to make an appointment to discuss the referral because it would cause a lot of issues and problems at home also doesn't want to upset the patient and have the anger taken out on her, stated she doesn't get the help she needs at home can call [name] back at [phone]

## 2024-07-19 NOTE — Progress Notes (Signed)
 Subjective:  Patient ID: [name], female    DOB: 1936/04/20,  MRN: [id number]  Chief Complaint  Patient presents with   Toe Pain    Pt stated that she is having a lot of discomfort with her toes she stated that the last time she was here to have her nails trimmed the Dr cut her skin and she has been having issues ever since     88 y.o. female presents with the above complaint.  Patient presents with left third digit porokeratotic lesion painful to touch is progressing and worse worse with ambulation is with pressure central nucleated core noted.  Wound.  Denies any other acute issues.  Pain scale 7 out of 10 dull aching nature   Review of Systems: Negative except as noted in the HPI. Denies N/V/F/Ch.  Past Medical History:  Diagnosis Date   Allergic rhinitis 10/30/2016   Anemia    Asthma    Breast cancer of upper-outer quadrant of left female breast (HCC) 11/08/2013   ER/PR+ Her2- Left IDC    Chronic renal insufficiency    Chronic rhinitis    Colon polyp    Diastolic dysfunction 07/10/2016   DJD (degenerative joint disease)    Dyspnea    Frozen shoulder    Full dentures    GERD (gastroesophageal reflux disease)    Hearing loss    Hypertension    Hyponatremia    Impaired glucose tolerance 07/18/2014   Memory loss    Morbid obesity (HCC)    Poor circulation    Vertigo    Wears glasses     Current Outpatient Medications:    albuterol  (VENTOLIN  HFA) 108 (90 Base) MCG/ACT inhaler, Inhale 1 puff into the lungs every 6 (six) hours as needed for wheezing or shortness of breath., Disp: 18 g, Rfl: 5   amLODipine  (NORVASC ) 5 MG tablet, TAKE 1 TABLET BY MOUTH EVERY DAY, Disp: 90 tablet, Rfl: 3   aspirin  EC 81 MG tablet, Take 81 mg by mouth every morning. Swallow whole., Disp: , Rfl:    Cholecalciferol (VITAMIN D3) 20 MCG (800 UNIT) TABS, Take 1 tablet by mouth daily., Disp: , Rfl:    cholestyramine  (QUESTRAN ) 4 g packet, TAKE 1 PACKET BY MOUTH 2 TIMES DAILY AS NEEDED  (DIARRHEA) START WITH 1/2 PACK A DAY AND ADJUST AS NEEDED. (Patient not taking: Reported on 07/19/2024), Disp: 90 packet, Rfl: 0   dapagliflozin  propanediol (FARXIGA ) 10 MG TABS tablet, Take 1 tablet (10 mg total) by mouth daily before breakfast., Disp: 90 tablet, Rfl: 3   dicyclomine  (BENTYL ) 20 MG tablet, TAKE 1 TABLET (20 MG TOTAL) BY MOUTH 3 (THREE) TIMES DAILY AS NEEDED FOR SPASMS., Disp: 50 tablet, Rfl: 0   diphenoxylate -atropine  (LOMOTIL ) 2.5-0.025 MG tablet, Take 1 tablet by mouth 4 (four) times daily as needed for diarrhea or loose stools., Disp: 30 tablet, Rfl: 1   Ensure (ENSURE), Take 1 Can by mouth 3 (three) times daily between meals., Disp: 237 mL, Rfl: 12   FLUAD QUADRIVALENT 0.5 ML injection, , Disp: , Rfl:    furosemide  (LASIX ) 40 MG tablet, Take 1 tablet (40 mg total) by mouth every Monday, Wednesday, and Friday. (Patient taking differently: Take 1 tablet (40 mg total) by mouth every Monday, Wednesday, and Friday.), Disp: , Rfl:    gabapentin  (NEURONTIN ) 300 MG capsule, TAKE 1 CAPSULE BY MOUTH THREE TIMES A DAY, Disp: 90 capsule, Rfl: 5   hydrocortisone  (ANUSOL -HC) 2.5 % rectal cream, Place 1 Application rectally 2 (two)  times daily. (Patient not taking: Reported on 07/19/2024), Disp: 30 g, Rfl: 1   Incontinence Supply Disposable (DEPEND UNDERWEAR SM/MED) MISC, Use as directed four times per day, Disp: 120 each, Rfl: 5   losartan  (COZAAR ) 50 MG tablet, Take 50 mg by mouth daily., Disp: , Rfl:    Magnesium  Glycinate 120 MG CAPS, Take 2 tablets by mouth at bedtime., Disp: 90 capsule, Rfl: 3   meclizine  (ANTIVERT ) 12.5 MG tablet, TAKE 1 TABLET BY MOUTH THREE TIMES A DAY AS NEEDED FOR DIZZINESS, Disp: 90 tablet, Rfl: 2   metoprolol  succinate (TOPROL -XL) 25 MG 24 hr tablet, TAKE 1 TABLET (25 MG TOTAL) BY MOUTH DAILY., Disp: 90 tablet, Rfl: 3   montelukast  (SINGULAIR ) 10 MG tablet, TAKE 1 TABLET BY MOUTH EVERY DAY, Disp: 90 tablet, Rfl: 2   Multiple Vitamin (MULTIVITAMIN WITH MINERALS) TABS  tablet, Take 1 tablet by mouth daily. Centrum Silver, Disp: , Rfl:    pantoprazole  (PROTONIX ) 40 MG tablet, TAKE 1 TABLET BY MOUTH EVERY DAY, Disp: 90 tablet, Rfl: 3   polyethylene glycol powder (GLYCOLAX /MIRALAX ) 17 GM/SCOOP powder, Take 17 g by mouth daily. (Patient not taking: Reported on 07/19/2024), Disp: 510 g, Rfl: 1   potassium chloride  (KLOR-CON ) 10 MEQ tablet, TAKE 1 TABLET BY MOUTH EVERY DAY WHEN TAKING FUROSEMIDE  (Patient not taking: Reported on 07/19/2024), Disp: 90 tablet, Rfl: 3   predniSONE  (DELTASONE ) 5 MG tablet, Take 1 tablet (5 mg total) by mouth daily with breakfast., Disp: 90 tablet, Rfl: 3   SPIKEVAX syringe, , Disp: , Rfl:    Turmeric (QC TUMERIC COMPLEX PO), Take 1,000 mg by mouth daily., Disp: , Rfl:    WIXELA INHUB 500-50 MCG/ACT AEPB, INHALE 1 PUFF INTO THE LUNGS IN THE MORNING AND AT BEDTIME., Disp: 180 each, Rfl: 3  Social History   Tobacco Use  Smoking Status Never   Passive exposure: Never  Smokeless Tobacco Never    Allergies  Allergen Reactions   Hydrocodone  Itching   Hydrocodone -Acetaminophen  Other (See Comments)   Lasix  [Furosemide ] Other (See Comments)    Dizziness.    Tizanidine  Other (See Comments)    Dizzy and fall   Tizanidine  Hcl Other (See Comments)   Iron  Hives and Other (See Comments)     bad constipation    Objective:  There were no vitals filed for this visit. There is no height or weight on file to calculate BMI. Constitutional Well developed. Well nourished.  Vascular Dorsalis pedis pulses palpable bilaterally. Posterior tibial pulses palpable bilaterally. Capillary refill normal to all digits.  No cyanosis or clubbing noted. Pedal hair growth normal.  Neurologic Normal speech. Oriented to person, place, and time. Epicritic sensation to light touch grossly present bilaterally.  Dermatologic Left third digit porokeratotic lesion with central nucleated core noted pain on palpation.  She denies any other issues.  Orthopedic:  Normal joint ROM without pain or crepitus bilaterally. No visible deformities. No bony tenderness.   Radiographs: None Assessment:   1. Porokeratosis    Plan:  Patient was evaluated and treated and all questions answered.  Left third digit porokeratosis - All questions and concerns were discussed with the patient in extensive detail I discussed shoe gear modification extensively to - Using chisel blade handle the lesion was debrided down.  No complication noted no pinpoint bleeding noted.  Toe protectors were dispensed.  No follow-ups on file.

## 2024-07-19 NOTE — Patient Outreach (Unsigned)
 Complex Care Management   Visit Note  07/19/2024  Name:  [name] MRN: [id number] DOB: 1936-08-19  Situation: Referral received for Complex Care Management related to Heart Failure I obtained verbal consent from Patient.  Visit completed with Patient  on the phone  Background:   Past Medical History:  Diagnosis Date   Allergic rhinitis 10/30/2016   Anemia    Asthma    Breast cancer of upper-outer quadrant of left female breast (HCC) 11/08/2013   ER/PR+ Her2- Left IDC    Chronic renal insufficiency    Chronic rhinitis    Colon polyp    Diastolic dysfunction 07/10/2016   DJD (degenerative joint disease)    Dyspnea    Frozen shoulder    Full dentures    GERD (gastroesophageal reflux disease)    Hearing loss    Hypertension    Hyponatremia    Impaired glucose tolerance 07/18/2014   Memory loss    Morbid obesity (HCC)    Poor circulation    Vertigo    Wears glasses     Assessment: patient reports episodes of feeling light headed upon standing twice this week. She reports feeling better with sitting back down. Daughter interested in clinical pharmacy referral for medication review for interactions.  Patient Reported Symptoms:  Cognitive Cognitive Status: Normal speech and language skills, Requires Assistance Decision Making (patient reports some memory difficulty)      Neurological Neurological Review of Symptoms: Other: [place] Neurological Symptoms/Conditions [RPT]: daughter reports memory issues. Daughter requesting neurology referral from PCP.    HEENT HEENT Symptoms Reported: No symptoms reported      Cardiovascular Cardiovascular Symptoms Reported: Swelling in legs or feet (reports chronic swelling a little in my left foot, patient reports feeling lightheaded twice this week both upon standing to pull up undergarments and then standing to go into the house. She states relieved by sitting back down) Does patient have uncontrolled Hypertension?:  No Cardiovascular Management Strategies: Routine screening, Medical device, Adequate rest, Fluid modification Weight: 150 lb (68 kg) Cardiovascular Self-Management Outcome: 3 (uncertain) Cardiovascular Comment: patient weights daily weight today 150 and weight yesterday 151lb. patient denies signs/symptoms of exacerbation at this time. reports BP checked by in home care aid. Per daughter, recent readings 110/90,117/70, 124/78.  Respiratory Respiratory Symptoms Reported: No symptoms reported Other Respiratory Symptoms: patient reports a little short of breath patient reportat times. She states she uses inhaler to help relieve. Respiratory Self-Management Outcome: 4 (good)  Endocrine Endocrine Symptoms Reported: No symptoms reported Is patient diabetic?: No    Gastrointestinal Gastrointestinal Symptoms Reported: No symptoms reported      Genitourinary Genitourinary Symptoms Reported: Incontinence Genitourinary Management Strategies: Incontinence garment/pad  Integumentary Integumentary Symptoms Reported: No symptoms reported    Musculoskeletal Musculoskelatal Symptoms Reviewed: Limited mobility, Difficulty walking Additional Musculoskeletal Details: use rollotor walker,        Psychosocial Psychosocial Symptoms Reported: No symptoms reported     Quality of Family Relationships: supportive, helpful (my daughter is very helpful) Do you feel physically threatened by others?: No    There were no vitals filed for this visit.  Medications Reviewed Today     Reviewed by Tayvin Preslar M, RN (Registered Nurse) on 07/19/24 at 1035  Med List Status: <None>   Medication Order Taking? Sig Documenting Provider Last Dose Status Informant  albuterol  (VENTOLIN  HFA) 108 (90 Base) MCG/ACT inhaler [id number] Yes Inhale 1 puff into the lungs every 6 (six) hours as needed for wheezing or shortness of breath. Norleen Agent  W, MD  Active   amLODipine  (NORVASC ) 5 MG tablet [id number] Yes TAKE 1 TABLET BY  MOUTH EVERY DAY [name], MD  Active Multiple Informants           Med Note (RATLIFF, THURSHELL   Mon Feb 28, 2024  1:28 PM) Pt did not take Rx today because of low blood pressure  aspirin  EC 81 MG tablet [id number] Yes Take 81 mg by mouth every morning. Swallow whole. [provider]  Active Multiple Informants  Cholecalciferol (VITAMIN D3) 20 MCG (800 UNIT) TABS [id number] Yes Take 1 tablet by mouth daily. Briana Elgin LABOR, MD  Active   cholestyramine  (QUESTRAN ) 4 g packet [id number]  TAKE 1 PACKET BY MOUTH 2 TIMES DAILY AS NEEDED (DIARRHEA) START WITH 1/2 PACK A DAY AND ADJUST AS NEEDED.  Patient not taking: Reported on 07/19/2024   Craig Alan SAUNDERS, PA-C  Active   dapagliflozin  propanediol (FARXIGA ) 10 MG TABS tablet [id number] Yes Take 1 tablet (10 mg total) by mouth daily before breakfast. [name], MD  Active   dicyclomine  (BENTYL ) 20 MG tablet [id number] Yes TAKE 1 TABLET (20 MG TOTAL) BY MOUTH 3 (THREE) TIMES DAILY AS NEEDED FOR SPASMS. Craig Alan SAUNDERS, PA-C  Active   diphenoxylate -atropine  (LOMOTIL ) 2.5-0.025 MG tablet [id number] Yes Take 1 tablet by mouth 4 (four) times daily as needed for diarrhea or loose stools. [name], MD  Active   Ensure [facility]) [id number] Yes Take 1 Can by mouth 3 (three) times daily between meals. [name], MD  Active Multiple Informants  FLUAD QUADRIVALENT 0.5 ML injection [id number]   [provider]  Active   furosemide  (LASIX ) 40 MG tablet [id number] Yes Take 1 tablet (40 mg total) by mouth every Monday, Wednesday, and Friday.  Patient taking differently: Take 1 tablet (40 mg total) by mouth every Monday, Wednesday, and Friday.   Briana Elgin LABOR, MD  Active            Med Note EVERETTE, Metzger C   Sun Feb 13, 2024  6:01 PM) Daughter brought in bottles Take 1 tablet by mouth 2 times daily  gabapentin  (NEURONTIN ) 300 MG capsule [id number] Yes TAKE 1 CAPSULE BY MOUTH THREE TIMES A DAY [name], MD  Active   hydrocortisone   (ANUSOL -HC) 2.5 % rectal cream [id number]  Place 1 Application rectally 2 (two) times daily.  Patient not taking: Reported on 07/19/2024   Craig Alan SAUNDERS, PA-C  Active   Incontinence Supply Disposable (DEPEND UNDERWEAR SM/MED) MISC [id number]  Use as directed four times per day [name], MD  Active Multiple Informants  losartan  (COZAAR ) 50 MG tablet [id number] Yes Take 50 mg by mouth daily. [provider]  Active   Magnesium  Glycinate 120 MG CAPS [id number] Yes Take 2 tablets by mouth at bedtime. Lorilee Sven SQUIBB, MD  Active   meclizine  (ANTIVERT ) 12.5 MG tablet [id number] Yes TAKE 1 TABLET BY MOUTH THREE TIMES A DAY AS NEEDED FOR DIZZINESS John, James W, MD  Active Multiple Informants  metoprolol  succinate (TOPROL -XL) 25 MG 24 hr tablet [id number] Yes TAKE 1 TABLET (25 MG TOTAL) BY MOUTH DAILY. [name], MD  Active   montelukast  (SINGULAIR ) 10 MG tablet [id number] Yes TAKE 1 TABLET BY MOUTH EVERY DAY [name], MD  Active   Multiple Vitamin (MULTIVITAMIN WITH MINERALS) TABS tablet [id number] Yes Take 1 tablet by mouth daily. Centrum Silver [provider]  Active Multiple Informants  pantoprazole  (PROTONIX ) 40  MG tablet [id number] Yes TAKE 1 TABLET BY MOUTH EVERY DAY [name], MD  Active   polyethylene glycol powder (GLYCOLAX /MIRALAX ) 17 GM/SCOOP powder [id number]  Take 17 g by mouth daily.  Patient not taking: Reported on 07/19/2024   Craig Alan SAUNDERS, PA-C  Active   potassium chloride  (KLOR-CON ) 10 MEQ tablet [id number]  TAKE 1 TABLET BY MOUTH EVERY DAY WHEN TAKING FUROSEMIDE   Patient not taking: Reported on 07/19/2024   [name], MD  Active            Med Note EVERETTE, Quamba C   Sun Feb 13, 2024  6:06 PM) Pt and daughter unsure if she takes this medication.  I reviewed multiple time because suppose to be taken with furosemide .  predniSONE  (DELTASONE ) 5 MG tablet [id number] Yes Take 1 tablet (5 mg total) by mouth daily with breakfast. Lorilee Sven SQUIBB, MD   Active   [facility] syringe [id number]   [provider]  Active   Turmeric (QC TUMERIC COMPLEX PO) [id number] Yes Take 1,000 mg by mouth daily. [provider]  Active   NAPOLEON INHUB 500-50 MCG/ACT AEPB [id number] Yes INHALE 1 PUFF INTO THE LUNGS IN THE MORNING AND AT BEDTIME. [name], MD  Active           Recommendation:   Referral to: LCSW re: caregiver stress counseling  Follow Up Plan:   Telephone follow up appointment date/time:  07/26/24 at 10:30 am  Heddy Shutter, RN, MSN, BSN, CCM [place]  [facility], Population Health Case Manager Phone: [phone]

## 2024-07-20 NOTE — Patient Instructions (Signed)
 Visit Information  Thank you for taking time to visit with me today. Please don't hesitate to contact me if I can be of assistance to you before our next scheduled appointment.  Your next care management appointment is by telephone on 07/26/24 at 10:30 am  Please call the care guide team at [phone] if you need to cancel, schedule, or reschedule an appointment.   Please call the Suicide and Crisis Lifeline: 988 call the [place]  National Suicide Prevention Lifeline: 639-024-3733 or TTY: 7692221000 TTY 302-209-4302) to talk to a trained counselor call 1-800-273-TALK (toll free, 24 hour hotline) if you are experiencing a Mental Health or Behavioral Health Crisis or need someone to talk to.   Heddy Shutter, RN, MSN, BSN, CCM [place]  [facility], Population Health Case Manager Phone: [phone]

## 2024-07-21 ENCOUNTER — Telehealth: Payer: Self-pay | Admitting: *Deleted

## 2024-07-21 NOTE — Progress Notes (Signed)
 Care Guide Pharmacy Note  07/21/2024 Name: [name] MRN: [id number] DOB: Aug 27, 1936  Referred By: [name], MD Reason for referral: Call Attempt #1 and Complex Care Management (Outreach to schedule referral with pharmacist )   [name] is a 88 y.o. year old female who is a primary care patient of Norleen, Lynwood ORN, MD.  [name] was referred to the pharmacist for assistance related to: CKD Stage 4  An unsuccessful telephone outreach was attempted today to contact the patient who was referred to the pharmacy team for assistance with medication management. Additional attempts will be made to contact the patient.  [name], CMA [place]  [facility], [facility] Guide Direct Dial: [phone]  Fax: [phone] Website: [email or web address]

## 2024-07-24 DIAGNOSIS — N184 Chronic kidney disease, stage 4 (severe): Secondary | ICD-10-CM | POA: Diagnosis not present

## 2024-07-24 NOTE — Progress Notes (Unsigned)
 Care Guide Pharmacy Note  07/24/2024 Name: [name] MRN: [id number] DOB: 1936/07/04  Referred By: [name], MD Reason for referral: Call Attempt #1 and Complex Care Management (Outreach to schedule referral with pharmacist )   [name] is a 88 y.o. year old female who is a primary care patient of Norleen, Lynwood ORN, MD.  [name] was referred to the pharmacist for assistance related to: CKD Stage 4  A second unsuccessful telephone outreach was attempted today to contact the patient who was referred to the pharmacy team for assistance with medication management. Additional attempts will be made to contact the patient.  [name], CMA [place]  [facility], [facility] Guide Direct Dial: [phone]  Fax: [phone] Website: [email or web address]

## 2024-07-24 NOTE — Telephone Encounter (Signed)
 Ok this is done

## 2024-07-24 NOTE — Telephone Encounter (Signed)
 Neurology would be ok but I would need to know what symptoms or condition that she would need the neurology referral for  After review of her problem list, I dont have a good reason, unless the daughter is calling as pt may have worsening memory problem or other problem

## 2024-07-25 NOTE — Progress Notes (Signed)
 Care Guide Pharmacy Note  07/25/2024 Name: [name] MRN: [id number] DOB: 1936-03-16  Referred By: [name], MD Reason for referral: Call Attempt #1 and Complex Care Management (Outreach to schedule referral with pharmacist )   [name] is a 88 y.o. year old female who is a primary care patient of [name], MD.  [name] was referred to the pharmacist for assistance related to: CKD Stage 4  Successful contact was made with the patient to discuss pharmacy services including being ready for the pharmacist to call at least 5 minutes before the scheduled appointment time and to have medication bottles and any blood pressure readings ready for review. The patient agreed to meet with the pharmacist via telephone visit on 07/31/2024  [name], CMA [place]  [facility], [facility] Guide Direct Dial: [phone]  Fax: [phone] Website: [email or web address]

## 2024-07-26 ENCOUNTER — Other Ambulatory Visit: Payer: Self-pay

## 2024-07-26 NOTE — Patient Instructions (Signed)
 Visit Information  Thank you for taking time to visit with me today. Please don't hesitate to contact me if I can be of assistance to you before our next scheduled appointment.  Your next care management appointment is by telephone on 08/23/24 at 9:30 am  Please call the care guide team at [phone] if you need to cancel, schedule, or reschedule an appointment.   Please call the Suicide and Crisis Lifeline: 988 call the [place]  National Suicide Prevention Lifeline: (818) 600-8444 or TTY: (276)841-7616 TTY 469-859-7688) to talk to a trained counselor call 1-800-273-TALK (toll free, 24 hour hotline) if you are experiencing a Mental Health or Behavioral Health Crisis or need someone to talk to.  Heddy Shutter, RN, MSN, BSN, CCM [place]  [facility], Population Health Case Manager Phone: [phone]

## 2024-07-26 NOTE — Patient Outreach (Signed)
 Complex Care Management   Visit Note  07/26/2024  Name:  [name] MRN: [id number] DOB: [birth year]  Situation: Referral received for Complex Care Management related to Heart Failure I obtained verbal consent from Patient.  Visit completed with patient and daughter  on the phone  Background:   Past Medical History:  Diagnosis Date   Allergic rhinitis 10/30/2016   Anemia    Asthma    Breast cancer of upper-outer quadrant of left female breast (HCC) 11/08/2013   ER/PR+ Her2- Left IDC    Chronic renal insufficiency    Chronic rhinitis    Colon polyp    Diastolic dysfunction 07/10/2016   DJD (degenerative joint disease)    Dyspnea    Frozen shoulder    Full dentures    GERD (gastroesophageal reflux disease)    Hearing loss    Hypertension    Hyponatremia    Impaired glucose tolerance 07/18/2014   Memory loss    Morbid obesity (HCC)    Poor circulation    Vertigo    Wears glasses     Assessment: Patient Reported Symptoms:  Cognitive Cognitive Status: Requires Assistance Decision Making      Neurological Oher Neurological Symptoms/Conditions [RPT]: memory issues.    HEENT HEENT Symptoms Reported: No symptoms reported      Cardiovascular Cardiovascular Symptoms Reported: No symptoms reported Does patient have uncontrolled Hypertension?: No Cardiovascular Comment: denies swelling at this time. patient reports feeling lightheaded on occasion with changing positions getting up. discussed changing positions slowly to avoid fall and discussed to avoide dehydration. has upcoming appintment with nephrologist-patient/daughter to discuss with nephrologist.  Respiratory Respiratory Symptoms Reported: No symptoms reported Other Respiratory Symptoms: reports feels a little SOB with activity    Endocrine Endocrine Symptoms Reported: No symptoms reported Is patient diabetic?: No    Gastrointestinal Gastrointestinal Symptoms Reported: No symptoms reported       Genitourinary Genitourinary Symptoms Reported: Incontinence Genitourinary Management Strategies: Incontinence garment/pad  Integumentary Integumentary Symptoms Reported: No symptoms reported    Musculoskeletal Musculoskelatal Symptoms Reviewed: Limited mobility, Difficulty walking Additional Musculoskeletal Details: uses rollator walker, frozen shoulders bilateral        Psychosocial Additional Psychological Details: LCSW scheduled to follow up with daughter          07/26/2024    PHQ2-9 Depression Screening   Little interest or pleasure in doing things    Feeling down, depressed, or hopeless    PHQ-2 - Total Score    Trouble falling or staying asleep, or sleeping too much    Feeling tired or having little energy    Poor appetite or overeating     Feeling bad about yourself - or that you are a failure or have let yourself or your family down    Trouble concentrating on things, such as reading the newspaper or watching television    Moving or speaking so slowly that other people could have noticed.  Or the opposite - being so fidgety or restless that you have been moving around a lot more than usual    Thoughts that you would be better off dead, or hurting yourself in some way    PHQ2-9 Total Score    If you checked off any problems, how difficult have these problems made it for you to do your work, take care of things at home, or get along with other people    Depression Interventions/Treatment      There were no vitals filed for this visit.  Medications  Reviewed Today     Reviewed by Shanyce Daris M, RN (Registered Nurse) on 07/26/24 at 1050  Med List Status: <None>   Medication Order Taking? Sig Documenting Provider Last Dose Status Informant  albuterol  (VENTOLIN  HFA) 108 (90 Base) MCG/ACT inhaler [id number] Yes Inhale 1 puff into the lungs every 6 (six) hours as needed for wheezing or shortness of breath. [name], MD  Active   amLODipine  (NORVASC ) 5 MG tablet  [id number] Yes TAKE 1 TABLET BY MOUTH EVERY DAY [name], MD  Active Multiple Informants           Med Note (RATLIFF, THURSHELL   Mon Feb 28, 2024  1:28 PM) Pt did not take Rx today because of low blood pressure  aspirin  EC 81 MG tablet [id number] Yes Take 81 mg by mouth every morning. Swallow whole. [provider]  Active Multiple Informants  Cholecalciferol (VITAMIN D3) 20 MCG (800 UNIT) TABS [id number] Yes Take 1 tablet by mouth daily. Briana Elgin LABOR, MD  Active   cholestyramine  (QUESTRAN ) 4 g packet [id number]  TAKE 1 PACKET BY MOUTH 2 TIMES DAILY AS NEEDED (DIARRHEA) START WITH 1/2 PACK A DAY AND ADJUST AS NEEDED.  Patient not taking: Reported on 07/26/2024   Craig Alan SAUNDERS, PA-C  Active   dapagliflozin  propanediol (FARXIGA ) 10 MG TABS tablet [id number] Yes Take 1 tablet (10 mg total) by mouth daily before breakfast. [name], MD  Active   dicyclomine  (BENTYL ) 20 MG tablet [id number] Yes TAKE 1 TABLET (20 MG TOTAL) BY MOUTH 3 (THREE) TIMES DAILY AS NEEDED FOR SPASMS. Craig Alan SAUNDERS, PA-C  Active   diphenoxylate -atropine  (LOMOTIL ) 2.5-0.025 MG tablet [id number] Yes Take 1 tablet by mouth 4 (four) times daily as needed for diarrhea or loose stools. [name], MD  Active   Ensure [facility]) [id number]  Take 1 Can by mouth 3 (three) times daily between meals. [name], MD  Active Multiple Informants  FLUAD QUADRIVALENT 0.5 ML injection [id number]   [provider]  Active   furosemide  (LASIX ) 40 MG tablet [id number] Yes Take 1 tablet (40 mg total) by mouth every Monday, Wednesday, and Friday. Briana Elgin LABOR, MD  Active            Med Note EVERETTE, [place] C   Sun Feb 13, 2024  6:01 PM) Daughter brought in bottles Take 1 tablet by mouth 2 times daily  gabapentin  (NEURONTIN ) 300 MG capsule [id number] Yes TAKE 1 CAPSULE BY MOUTH THREE TIMES A DAY [name], MD  Active   hydrocortisone  (ANUSOL -HC) 2.5 % rectal cream [id number]  Place 1 Application rectally 2 (two)  times daily.  Patient not taking: Reported on 07/26/2024   Craig Alan SAUNDERS, PA-C  Active   Incontinence Supply Disposable (DEPEND UNDERWEAR SM/MED) MISC [id number]  Use as directed four times per day [name], MD  Active Multiple Informants  losartan  (COZAAR ) 50 MG tablet [id number] Yes Take 50 mg by mouth daily. [provider]  Active   Magnesium  Glycinate 120 MG CAPS [id number] Yes Take 2 tablets by mouth at bedtime. Lorilee Sven SQUIBB, MD  Active   meclizine  (ANTIVERT ) 12.5 MG tablet [id number] Yes TAKE 1 TABLET BY MOUTH THREE TIMES A DAY AS NEEDED FOR DIZZINESS John, James W, MD  Active Multiple Informants  metoprolol  succinate (TOPROL -XL) 25 MG 24 hr tablet [id number] Yes TAKE 1 TABLET (25 MG TOTAL) BY MOUTH DAILY. [name], MD  Active   montelukast  (SINGULAIR ) 10  MG tablet [id number] Yes TAKE 1 TABLET BY MOUTH EVERY DAY [name], MD  Active   Multiple Vitamin (MULTIVITAMIN WITH MINERALS) TABS tablet [id number] Yes Take 1 tablet by mouth daily. Centrum Silver [provider]  Active Multiple Informants  pantoprazole  (PROTONIX ) 40 MG tablet [id number] Yes TAKE 1 TABLET BY MOUTH EVERY DAY [name], MD  Active   polyethylene glycol powder (GLYCOLAX /MIRALAX ) 17 GM/SCOOP powder [id number]  Take 17 g by mouth daily.  Patient not taking: Reported on 07/26/2024   Craig Alan SAUNDERS, PA-C  Active   potassium chloride  (KLOR-CON ) 10 MEQ tablet [id number]  TAKE 1 TABLET BY MOUTH EVERY DAY WHEN TAKING FUROSEMIDE   Patient not taking: Reported on 07/26/2024   [name], MD  Active            Med Note EVERETTE, [place] C   Sun Feb 13, 2024  6:06 PM) Pt and daughter unsure if she takes this medication.  I reviewed multiple time because suppose to be taken with furosemide .  predniSONE  (DELTASONE ) 5 MG tablet [id number] Yes Take 1 tablet (5 mg total) by mouth daily with breakfast. Lorilee Sven SQUIBB, MD  Active   [facility] syringe [id number]   [provider]  Active    Turmeric (QC TUMERIC COMPLEX PO) [id number] Yes Take 1,000 mg by mouth daily. [provider]  Active   NAPOLEON INHUB 500-50 MCG/ACT AEPB [id number] Yes INHALE 1 PUFF INTO THE LUNGS IN THE MORNING AND AT BEDTIME. [name], MD  Active           Recommendation:   Continue Current Plan of Care  Follow Up Plan:   Telephone follow up appointment date/time:  08/23/2024 aat 9:30 am  Heddy Shutter, RN, MSN, BSN, CCM [place]  [facility], Population Health Case Manager Phone: [phone]

## 2024-07-31 ENCOUNTER — Other Ambulatory Visit

## 2024-07-31 DIAGNOSIS — N184 Chronic kidney disease, stage 4 (severe): Secondary | ICD-10-CM

## 2024-07-31 DIAGNOSIS — N2581 Secondary hyperparathyroidism of renal origin: Secondary | ICD-10-CM | POA: Diagnosis not present

## 2024-07-31 DIAGNOSIS — I129 Hypertensive chronic kidney disease with stage 1 through stage 4 chronic kidney disease, or unspecified chronic kidney disease: Secondary | ICD-10-CM | POA: Diagnosis not present

## 2024-07-31 DIAGNOSIS — D631 Anemia in chronic kidney disease: Secondary | ICD-10-CM | POA: Diagnosis not present

## 2024-07-31 DIAGNOSIS — R600 Localized edema: Secondary | ICD-10-CM | POA: Diagnosis not present

## 2024-07-31 DIAGNOSIS — R809 Proteinuria, unspecified: Secondary | ICD-10-CM | POA: Diagnosis not present

## 2024-07-31 DIAGNOSIS — R7303 Prediabetes: Secondary | ICD-10-CM | POA: Diagnosis not present

## 2024-07-31 NOTE — Telephone Encounter (Signed)
 Patient's daughter Garvin informed of referral being placed and provided with information to their office. Referral still awaiting review

## 2024-07-31 NOTE — Progress Notes (Unsigned)
 07/31/2024 Name: [name] MRN: [id number] DOB: 1935-12-20  Chief Complaint  Patient presents with   Medication Management    [name] is a 88 y.o. year old female who presented for a telephone visit.   They were referred to the pharmacist by the [facility] Complex Case Management program for assistance in managing medication reconciliation.   Saw renal provider today - adding once weekly diuretic (metozalone?) Gabapentin  - some shakes - renal dose adjustment, reduce to twice daily  no longer having diarrhea   Needs montelukast  refill    Subjective:  Care Team: Primary Care Provider: [name], MD ; Next Scheduled Visit: *** {careteamprovider:27366}  Medication Access/Adherence  Current Pharmacy:  [facility] [phone] - [place], [place] - [address] AT [address] OF [facility] [address]  [place] [address] Phone: [phone] Fax: [phone]   Patient reports affordability concerns with their medications: {YES/NO:21197} Patient reports access/transportation concerns to their pharmacy: {YES/NO:21197} Patient reports adherence concerns with their medications:  {YES/NO:21197} ***   {Pharmacy S/O Choices:26420}   Objective:  Lab Results  Component Value Date   HGBA1C 6.4 08/24/2023    Lab Results  Component Value Date   CREATININE 2.31 (H) 02/28/2024   BUN 58 (H) 02/28/2024   NA 141 02/28/2024   K 3.8 02/28/2024   CL 94 (L) 02/28/2024   CO2 32 02/28/2024    Lab Results  Component Value Date   CHOL 219 (H) 08/24/2023   HDL 57.90 08/24/2023   LDLCALC 131 (H) 08/24/2023   LDLDIRECT 137.0 02/02/2022   TRIG 153.0 (H) 08/24/2023   CHOLHDL 4 08/24/2023    Medications Reviewed Today     Reviewed by Merceda Lela SAUNDERS, RPH (Pharmacist) on 07/31/24 at 1639  Med List Status: <None>   Medication Order Taking? Sig Documenting Provider Last Dose Status Informant  albuterol  (VENTOLIN  HFA) 108 (90 Base) MCG/ACT  inhaler [id number] Yes Inhale 1 puff into the lungs every 6 (six) hours as needed for wheezing or shortness of breath. [name], MD  Active     Discontinued 07/31/24 1347          Med Note (RATLIFF, THURSHELL   Mon Feb 28, 2024  1:28 PM) Pt did not take Rx today because of low blood pressure  aspirin  EC 81 MG tablet [id number] Yes Take 81 mg by mouth every morning. Swallow whole. [provider]  Active Multiple Informants  Cholecalciferol (VITAMIN D3) 20 MCG (800 UNIT) TABS [id number] Yes Take 1 tablet by mouth daily. Briana Elgin LABOR, MD  Active   cholestyramine  (QUESTRAN ) 4 g packet [id number]  TAKE 1 PACKET BY MOUTH 2 TIMES DAILY AS NEEDED (DIARRHEA) START WITH 1/2 PACK A DAY AND ADJUST AS NEEDED.  Patient not taking: Reported on 07/26/2024   Craig Alan SAUNDERS, PA-C  Active   dapagliflozin  propanediol (FARXIGA ) 10 MG TABS tablet [id number] Yes Take 1 tablet (10 mg total) by mouth daily before breakfast. [name], MD  Active   dicyclomine  (BENTYL ) 20 MG tablet [id number]  TAKE 1 TABLET (20 MG TOTAL) BY MOUTH 3 (THREE) TIMES DAILY AS NEEDED FOR SPASMS.  Patient not taking: Reported on 07/31/2024   Craig Alan SAUNDERS, PA-C  Active   diphenoxylate -atropine  (LOMOTIL ) 2.5-0.025 MG tablet [id number]  Take 1 tablet by mouth 4 (four) times daily as needed for diarrhea or loose stools.  Patient not taking: Reported on 07/31/2024   [name], MD  Active   Ensure [facility]) [id number]  Take  1 Can by mouth 3 (three) times daily between meals. [name], MD  Active Multiple Informants     Discontinued 07/31/24 1350   furosemide  (LASIX ) 40 MG tablet [id number] Yes Take 1 tablet (40 mg total) by mouth every Monday, Wednesday, and Friday. Briana Elgin LABOR, MD  Active            Med Note EVERETTE, [place] C   Sun Feb 13, 2024  6:01 PM) Daughter brought in bottles Take 1 tablet by mouth 2 times daily  gabapentin  (NEURONTIN ) 300 MG capsule [id number] Yes TAKE 1 CAPSULE BY MOUTH THREE TIMES A DAY [name], MD  Active   hydrocortisone  (ANUSOL -HC) 2.5 % rectal cream [id number]  Place 1 Application rectally 2 (two) times daily.  Patient not taking: Reported on 07/26/2024   Craig Alan SAUNDERS, PA-C  Active   Incontinence Supply Disposable (DEPEND UNDERWEAR SM/MED) MISC [id number]  Use as directed four times per day [name], MD  Active Multiple Informants  losartan  (COZAAR ) 50 MG tablet [id number] Yes Take 50 mg by mouth daily. [provider]  Active     Discontinued 07/31/24 1338 (Discontinued by provider)   meclizine  (ANTIVERT ) 12.5 MG tablet [id number]  TAKE 1 TABLET BY MOUTH THREE TIMES A DAY AS NEEDED FOR DIZZINESS John, James W, MD  Active Multiple Informants  metoprolol  succinate (TOPROL -XL) 25 MG 24 hr tablet [id number]  TAKE 1 TABLET (25 MG TOTAL) BY MOUTH DAILY.  Patient not taking: Reported on 07/31/2024   [name], MD  Active   montelukast  (SINGULAIR ) 10 MG tablet [id number]  TAKE 1 TABLET BY MOUTH EVERY DAY  Patient not taking: Reported on 07/31/2024   [name], MD  Active   Multiple Vitamin (MULTIVITAMIN WITH MINERALS) TABS tablet [id number] Yes Take 1 tablet by mouth daily. Centrum Silver [provider]  Active Multiple Informants  pantoprazole  (PROTONIX ) 40 MG tablet [id number] Yes TAKE 1 TABLET BY MOUTH EVERY DAY [name], MD  Active    Patient not taking:   Discontinued 07/31/24 1349    Patient not taking:   Discontinued 07/31/24 1344            Med Note EVERETTE, [place] C   Sun Feb 13, 2024  6:06 PM) Pt and daughter unsure if she takes this medication.  I reviewed multiple time because suppose to be taken with furosemide .  predniSONE  (DELTASONE ) 5 MG tablet [id number] Yes Take 1 tablet (5 mg total) by mouth daily with breakfast. Lorilee Sven SQUIBB, MD  Active      Discontinued 07/31/24 1349   Turmeric (QC TUMERIC COMPLEX PO) [id number] Yes Take 1,000 mg by mouth daily. [provider]  Active   NAPOLEON INHUB 500-50 MCG/ACT AEPB [id number]  Yes INHALE 1 PUFF INTO THE LUNGS IN THE MORNING AND AT BEDTIME. [name], MD  Active               Assessment/Plan:   {Pharmacy A/P Choices:26421}  Follow Up Plan: ***  ***

## 2024-08-01 ENCOUNTER — Encounter: Attending: Physical Medicine and Rehabilitation | Admitting: Physical Medicine and Rehabilitation

## 2024-08-01 ENCOUNTER — Encounter: Payer: Self-pay | Admitting: Physical Medicine and Rehabilitation

## 2024-08-01 VITALS — BP 118/66 | HR 74 | Ht 65.0 in | Wt 160.0 lb

## 2024-08-01 DIAGNOSIS — M7501 Adhesive capsulitis of right shoulder: Secondary | ICD-10-CM | POA: Diagnosis not present

## 2024-08-01 DIAGNOSIS — M7502 Adhesive capsulitis of left shoulder: Secondary | ICD-10-CM | POA: Insufficient documentation

## 2024-08-01 DIAGNOSIS — R251 Tremor, unspecified: Secondary | ICD-10-CM | POA: Diagnosis not present

## 2024-08-01 DIAGNOSIS — J45909 Unspecified asthma, uncomplicated: Secondary | ICD-10-CM | POA: Diagnosis not present

## 2024-08-01 MED ORDER — MONTELUKAST SODIUM 10 MG PO TABS: 10.0000 mg | ORAL_TABLET | Freq: Every day | ORAL | 2 refills | Status: AC

## 2024-08-01 MED ORDER — GABAPENTIN 300 MG PO CAPS: 300.0000 mg | ORAL_CAPSULE | Freq: Two times a day (BID) | ORAL | 2 refills | Status: AC

## 2024-08-01 MED ORDER — MONTELUKAST SODIUM 10 MG PO TABS: 10.0000 mg | ORAL_TABLET | Freq: Every day | ORAL | 2 refills | Status: DC

## 2024-08-01 MED ORDER — PREDNISONE 5 MG PO TABS: 5.0000 mg | ORAL_TABLET | Freq: Every day | ORAL | 3 refills | Status: AC | PRN

## 2024-08-01 NOTE — Progress Notes (Signed)
 Subjective:    Patient ID: [name], female    DOB: Jul 03, 1936, 88 y.o.   MRN: [id number]  1) Bilateral shoulder pain: -[name] is an 88 year old woman who presents for follow-up of pain in her bilateral shoulders.   -she is interested in trying tens unit  -she has been having benefit with prednisone  5mg  daily as needed  -shoulder pain is worst in the morning  -she takes the steroids every day  -she did PT and this helped  -pain is stable  -she takes gabapentin  300mg  BID  -doesn't like the weight gain from the prednisone  so she does not want to take it anymore   2) Bilateral hip pain -also has pain in hips  -had no benefit from right hip steroid injection -oral steroids help. That is the thing that really helps her. She has been taking 2mg  daily. She is worried about taking too much steroids -hasn't used lidocaine  patches  She had no benefit from the corticosteroid shots. She felt sick afterward. Her daughter has to bring her to her appointments so she was not able to come in earlier. She is taking oral prednisone - she started yesterday. Has not yet been able to feel any benefit. She currently feels pain in both shoulders and thighs.   She prefers less medications. She is willing to try gel and patches and anti-inflammatory foods.   She is having difficulty raising her hands and thus performing ADLs due to her pain. She does have excellent family support for which she is very thankful.    Her daughter calls in today.   Her doctor stopped the prednisone  because of her kidneys. The prednisone  really helped. Her daughter would prefer using the steroids as needed rather than trying something else   Pain Inventory Average Pain 7 Pain Right Now 7 My pain is constant, dull, and aching  In the last 24 hours, has pain interfered with the following? General activity 0 Relation with others 0 Enjoyment of life 7  What TIME of day is your pain at its worst?  morning  Sleep (in general) Good  Pain is worse with: walking, standing, and some activites Pain improves with: medication and heat, exercise Relief from Meds: 5  Family History  Problem Relation Age of Onset   Colon cancer Mother        in her 44's   Stomach cancer Mother    Lung cancer Brother        was a smoker   Heart attack Son 30   Healthy Daughter    Social History   Socioeconomic History   Marital status: Widowed    Spouse name: Not on file   Number of children: 2   Years of education: Not on file   Highest education level: Not on file  Occupational History   Occupation: owns bakery and works PT for [employer]  Tobacco Use   Smoking status: Never    Passive exposure: Never   Smokeless tobacco: Never  Vaping Use   Vaping status: Never Used  Substance and Sexual Activity   Alcohol use: No    Alcohol/week: 0.0 standard drinks of alcohol   Drug use: No   Sexual activity: Not Currently  Other Topics Concern   Not on file  Social History Narrative   Widowed    Social Drivers of Health   Financial Resource Strain: Low Risk  (06/27/2024)   Overall Financial Resource Strain (CARDIA)    Difficulty  of Paying Living Expenses: Not hard at all  Food Insecurity: No Food Insecurity (07/19/2024)   Hunger Vital Sign    Worried About Running Out of Food in the Last Year: Never true    Ran Out of Food in the Last Year: Never true  Transportation Needs: No Transportation Needs (07/19/2024)   PRAPARE - Administrator, Civil Service (Medical): No    Lack of Transportation (Non-Medical): No  Physical Activity: Inactive (06/27/2024)   Exercise Vital Sign    Days of Exercise per Week: 0 days    Minutes of Exercise per Session: 0 min  Stress: No Stress Concern Present (06/27/2024)   [employer] of Occupational Health - Occupational Stress Questionnaire    Feeling of Stress: Not at all  Social Connections: Moderately Integrated (06/27/2024)   Social Connection  and Isolation Panel    Frequency of Communication with Friends and Family: More than three times a week    Frequency of Social Gatherings with Friends and Family: More than three times a week    Attends Religious Services: More than 4 times per year    Active Member of [employer] or Organizations: Yes    Attends Banker Meetings: More than 4 times per year    Marital Status: Widowed   Past Surgical History:  Procedure Laterality Date   ABDOMINAL HYSTERECTOMY     BREAST LUMPECTOMY WITH NEEDLE LOCALIZATION AND AXILLARY SENTINEL LYMPH NODE BX Left 12/04/2013   Procedure: BREAST LUMPECTOMY WITH NEEDLE LOCALIZATION AND AXILLARY SENTINEL LYMPH NODE BX;  Surgeon: Alm VEAR Angle, MD;  Location: [facility];  Service: General;  Laterality: Left;   CATARACT EXTRACTION  2009   rt   COLONOSCOPY     EYE SURGERY Bilateral    cataract surgery   KNEE ARTHROSCOPY     both   LUMBAR LAMINECTOMY/DECOMPRESSION MICRODISCECTOMY Left 12/23/2017   Procedure: Left Lumbar One-Two Laminectomy with microdiscectomy;  Surgeon: Joshua Alm RAMAN, MD;  Location: [facility] OR;  Service: Neurosurgery;  Laterality: Left;  Left L1-2 Laminectomy with microdiscectomy   TONSILLECTOMY     TOTAL KNEE ARTHROPLASTY  2002   rt   TOTAL KNEE ARTHROPLASTY  2003   left   VESICOVAGINAL FISTULA CLOSURE W/ TAH  1980   Past Surgical History:  Procedure Laterality Date   ABDOMINAL HYSTERECTOMY     BREAST LUMPECTOMY WITH NEEDLE LOCALIZATION AND AXILLARY SENTINEL LYMPH NODE BX Left 12/04/2013   Procedure: BREAST LUMPECTOMY WITH NEEDLE LOCALIZATION AND AXILLARY SENTINEL LYMPH NODE BX;  Surgeon: Alm VEAR Angle, MD;  Location: [facility];  Service: General;  Laterality: Left;   CATARACT EXTRACTION  2009   rt   COLONOSCOPY     EYE SURGERY Bilateral    cataract surgery   KNEE ARTHROSCOPY     both   LUMBAR LAMINECTOMY/DECOMPRESSION MICRODISCECTOMY Left 12/23/2017   Procedure: Left Lumbar One-Two Laminectomy with  microdiscectomy;  Surgeon: Joshua Alm RAMAN, MD;  Location: [facility] OR;  Service: Neurosurgery;  Laterality: Left;  Left L1-2 Laminectomy with microdiscectomy   TONSILLECTOMY     TOTAL KNEE ARTHROPLASTY  2002   rt   TOTAL KNEE ARTHROPLASTY  2003   left   VESICOVAGINAL FISTULA CLOSURE W/ TAH  1980   Past Medical History:  Diagnosis Date   Allergic rhinitis 10/30/2016   Anemia    Asthma    Breast cancer of upper-outer quadrant of left female breast (HCC) 11/08/2013   ER/PR+ Her2- Left IDC  Chronic renal insufficiency    Chronic rhinitis    Colon polyp    Diastolic dysfunction 07/10/2016   DJD (degenerative joint disease)    Dyspnea    Frozen shoulder    Full dentures    GERD (gastroesophageal reflux disease)    Hearing loss    Hypertension    Hyponatremia    Impaired glucose tolerance 07/18/2014   Memory loss    Morbid obesity (HCC)    Poor circulation    Vertigo    Wears glasses    BP 118/66   Pulse 74   Ht 5' 5 (1.651 m)   Wt 160 lb (72.6 kg)   SpO2 95%   BMI 26.63 kg/m   Opioid Risk Score:   Fall Risk Score:  `1  Depression screen [facility] 2/9     08/01/2024    9:36 AM 06/27/2024    2:06 PM 05/30/2024   10:11 AM 02/22/2024    1:10 PM 02/01/2024    3:55 PM 12/24/2023   10:15 AM 08/24/2023    1:19 PM  Depression screen PHQ 2/9  Decreased Interest 0 0 0 0 0 0 0  Down, Depressed, Hopeless 0 0 0 0 0 0 0  PHQ - 2 Score 0 0 0 0 0 0 0  Altered sleeping  0     0  Tired, decreased energy  0     0  Change in appetite  0     0  Feeling bad or failure about yourself   0     0  Trouble concentrating  0     0  Moving slowly or fidgety/restless  3     0  Suicidal thoughts  0     0  PHQ-9 Score  3     0  Difficult doing work/chores  Extremely dIfficult     Not difficult at all   Review of Systems  Constitutional: Negative.   HENT: Negative.    Eyes: Negative.   Respiratory: Negative.    Cardiovascular: Negative.   Gastrointestinal: Negative.   Endocrine: Negative.    Genitourinary: Negative.   Musculoskeletal:  Positive for arthralgias, back pain and gait problem.       Pain both shoulders  Skin: Negative.   Allergic/Immunologic: Negative.   Hematological: Negative.   Psychiatric/Behavioral: Negative.    All other systems reviewed and are negative.      Objective:   Physical Exam Gen: no distress, normal appearing, BMI 29.85, 152/83 HEENT: oral mucosa pink and moist, NCAT Cardio: Reg rate Chest: normal effort, normal rate of breathing Abd: soft, non-distended Ext: no edema Skin: intact Neuro:Alert and oriented x3 Musculoskeletal: Severely limited range of motion in bilateral arms- only about 30 degrees in bilateral elevation.  Psych: pleasant, normal affect    Assessment & Plan:   1) Chronic bilateral adhesive capsulitis: Tylenol  650mg  morning, lunch, dinner  -discussed that she would like to minimize prednisone  use, sent 5mg  daily to use prn  -discussed that she gets a home health aide and they pay out of pocket for the personal care  -discussed home PT and OT, ordered  -recommend decreasing gabapentin  to HS  -continue to use turmeric, recommended adding black pepper  -discussed aquthatherapy  -stop tens unit as needed daughter's assistance  -continue heat/vibration shield   -stop voltaren  as not helping  -recommended olive oil after her shoulder  Lidocaine  patch on both shoulders, reordered  -continue prednisone  5mg  daily   -home therapy ordered  Vitamin D  level checked and was normal in 2020, recommended daily D3 while on steroids  -recommended red light therapy  2) Hypomagnesemia  -Magnesium  level checked from 2019 and was low at 1.3: will order supplement to take at night  3) Family support: Has wonderful family support. Discussed how touched she is by her children's care.   4) Incontinent stool/constipation: Magnesium  will also help to regulate this. Discussed goal of 1-2 BM per day.   5) Polymyalgia  rheumatica -continue prednisone  -continue CBD oil -Discussed current symptoms of pain and history of pain.  -Discussed benefits of exercise in reducing pain. -Discussed following foods that may reduce pain: 1) Ginger (especially studied for arthritis)- reduce leukotriene production to decrease inflammation 2) Blueberries- high in phytonutrients that decrease inflammation 3) Salmon- marine omega-3s reduce joint swelling and pain 4) Pumpkin seeds- reduce inflammation 5) dark chocolate- reduces inflammation 6) turmeric- reduces inflammation 7) tart cherries - reduce pain and stiffness 8) extra virgin olive oil - its compound olecanthal helps to block prostaglandins  9) chili peppers- can be eaten or applied topically via capsaicin 10) mint- helpful for headache, muscle aches, joint pain, and itching 11) garlic- reduces inflammation  Link to further information on diet for chronic pain: http://www.bray.com/   Turmeric to reduce inflammation--can be used in cooking or taken as a supplement.  Benefits of turmeric:  -Highly anti-inflammatory  -Increases antioxidants  -Improves memory, attention, brain disease  -Lowers risk of heart disease  -May help prevent cancer  -Decreases pain  -Alleviates depression  -Delays aging and decreases risk of chronic disease  -Consume with black pepper to increase absorption    Turmeric Milk Recipe:  1 cup milk  1 tsp turmeric  1 tsp cinnamon  1 tsp grated ginger (optional)  Black pepper (boosts the anti-inflammatory properties of turmeric).  1 tsp honey   6) CKD stage 4: -recommending staying well hydrated.   7) Osteoporosis? -discussed benefits of weight bearing exercise -recommend yogurt and figs.   8) Tongue drying out/?sleep apnea/snoring -discussed decreasing inflammatory carbohydrates in the diet -avoid mouth wash  9) Bilateral hip  pain: -Prescribing Home Zynex NexWave Stimulator Device and supplies as needed. IFC, NMES and TENS medically necessary Treatment Rx: Daily @ 30-40 minutes per treatment PRN. Zynex NexWave only, no substitutions. Treatment Goals: 1) To reduce and/or eliminate pain 2) To improve functional capacity and Activities of daily living 3) To reduce or prevent the need for oral medications 4) To improve circulation in the injured region 5) To decrease or prevent muscle spasm and muscle atrophy 6) To provide a self-management tool to the patient The patient has not sufficiently improved with conservative care. Numerous studies indexed by Medline and PubMed.gov have shown Neuromuscular, Interferential, and TENS stimulators to reduce pain, improve function, and reduce medication use in injured patients. Continued use of this evidence based, safe, drug free treatment is both reasonable and medically necessary at this time.   -discussed SI belt  10) Overweight:  -discussed that weight is improved  11) HTN: -BP reviewed and is perfect today! -Advised checking BP daily at home and logging results to bring into follow-up appointment with PCP and myself. -Reviewed BP meds today.  -Advised regarding healthy foods that can help lower blood pressure and provided with a list: 1) citrus foods- high in vitamins and minerals 2) salmon and other fatty fish - reduces inflammation and oxylipins 3) swiss chard (leafy green)- high level of nitrates 4) pumpkin seeds- one of the best natural sources  of magnesium  5) Beans and lentils- high in fiber, magnesium , and potassium 6) Berries- high in flavonoids 7) Amaranth (whole grain, can be cooked similarly to rice and oats)- high in magnesium  and fiber 8) Pistachios- even more effective at reducing BP than other nuts 9) Carrots- high in phenolic compounds that relax blood vessels and reduce inflammation 10) Celery- contain phthalides that relax tissues of arterial walls 11)  Tomatoes- can also improve cholesterol and reduce risk of heart disease 12) Broccoli- good source of magnesium , calcium, and potassium 13) Greek yogurt: high in potassium and calcium 14) Herbs and spices: Celery seed, cilantro, saffron, lemongrass, black cumin, ginseng, cinnamon, cardamom, sweet basil, and ginger 15) Chia and flax seeds- also help to lower cholesterol and blood sugar 16) Beets- high levels of nitrates that relax blood vessels  17) spinach and bananas- high in potassium  -Provided lise of supplements that can help with hypertension:  1) magnesium : one high quality brand is Bioptemizers since it contains all 7 types of magnesium , otherwise over the counter magnesium  gluconate 400mg  is a good option 2) B vitamins 3) vitamin D  4) potassium 5) CoQ10 6) L-arginine 7) Vitamin C 8) Beetroot -Educated that goal BP is 120/80. -Made goal to incorporate some of the above foods into diet.    12) Tremors: -discussed that she has intermittent tremors of the head -discussed propanolol  13) Asthma: -refilled Singulair 

## 2024-08-01 NOTE — Patient Instructions (Signed)
 It was a pleasure speaking with you today!  Reduce gabapentin  to 300 mg twice daily for kidney function.  Feel free to call with any questions or concerns!  Darrelyn Drum, PharmD, BCPS, CPP Clinical Pharmacist Practitioner [facility] [phone]

## 2024-08-09 NOTE — Telephone Encounter (Signed)
 Copied from CRM [id number]. Topic: Clinical - Medication Question >> Aug 08, 2024  4:27 PM Lauren C wrote: Reason for CRM: Pt requesting a call back from a nurse regarding metoprolol , she wants to know why she is taking this medication and if it is new. [phone]

## 2024-08-09 NOTE — Telephone Encounter (Signed)
 Called patient and explained to her to not take this medication as of now due to it being marked as not taken on her medication list pt verbalized she understood

## 2024-08-14 ENCOUNTER — Telehealth: Payer: Self-pay

## 2024-08-14 ENCOUNTER — Telehealth: Payer: Self-pay | Admitting: *Deleted

## 2024-08-14 ENCOUNTER — Encounter: Payer: Self-pay | Admitting: *Deleted

## 2024-08-14 DIAGNOSIS — N184 Chronic kidney disease, stage 4 (severe): Secondary | ICD-10-CM | POA: Diagnosis not present

## 2024-08-14 NOTE — Telephone Encounter (Signed)
 Janella, OT from [facility] called for verbal orders 2wk8 for [facility]. Orders given

## 2024-08-17 ENCOUNTER — Ambulatory Visit: Payer: Self-pay

## 2024-08-17 NOTE — Telephone Encounter (Signed)
 FYI Only or Action Required?: FYI only for provider.  Patient was last seen in primary care on 05/30/2024 by [name], MD.  Called Nurse Triage reporting Abdominal Pain.  Symptoms began today.  Interventions attempted: Nothing.  Symptoms are: unchanged.  Triage Disposition: See PCP When Office is Open (Within 3 Days)  Patient/caregiver understands and will follow disposition?: YesCopied from CRM #[id number]. Topic: Clinical - Red Word Triage >> Aug 17, 2024  4:14 PM Rosina BIRCH wrote: Reason for RMF:ejupzwu daughter called stating the patient is having an uncomfortable full feeling on the left side of her stomach mid area. Patient stated it happened after breakfast Reason for Disposition  [1] Few streaks of blood in vomit AND [2] occurred one time  Answer Assessment - Initial Assessment Questions No available appts today. Scheduled appt 08/18/24.   Advised UC/ED/911 if symptoms worsen.  1. LOCATION: Where does it hurt?      Above belly, epigastric area, tight feeling,not pain just feels full 2. RADIATION: Does the pain shoot anywhere else? (e.g., chest, back)     Not radiates 3. ONSET: When did the pain begin? (e.g., minutes, hours or days ago)      Today; after eating breakfast; last 30 minutes 4. SUDDEN: Gradual or sudden onset?     gradual 5. PATTERN Does the pain come and go, or is it constant?     constant 6. SEVERITY: How bad is the pain?  (e.g., Scale 1-10; mild, moderate, or severe)     0/10, feels tight and full; currently not but happen twice today 7. RECURRENT SYMPTOM: Have you ever had this type of stomach pain before? If Yes, ask: When was the last time? and What happened that time?      Been having GI discomfort for past 6 months 8. CAUSE: What do you think is causing the stomach pain? (e.g., gallstones, recent abdominal surgery)     No, possible food 9. RELIEVING/AGGRAVATING FACTORS: What makes it better or worse? (e.g., antacids, bending or  twisting motion, bowel movement)     Eating feels worse and feels better now 10. OTHER SYMPTOMS: Do you have any other symptoms? (e.g., back pain, diarrhea, fever, urination pain, vomiting)       Denies sob, chest pain, n/v, dizziness, faint, sweating  Last BM today, solid, brown  Protocols used: Abdominal Pain - Female-A-AH

## 2024-08-18 ENCOUNTER — Ambulatory Visit (INDEPENDENT_AMBULATORY_CARE_PROVIDER_SITE_OTHER): Admitting: Internal Medicine

## 2024-08-18 ENCOUNTER — Encounter: Payer: Self-pay | Admitting: Internal Medicine

## 2024-08-18 ENCOUNTER — Ambulatory Visit: Admitting: Internal Medicine

## 2024-08-18 VITALS — BP 124/72 | HR 75 | Temp 98.7°F | Ht 65.0 in | Wt 154.0 lb

## 2024-08-18 DIAGNOSIS — R109 Unspecified abdominal pain: Secondary | ICD-10-CM | POA: Insufficient documentation

## 2024-08-18 DIAGNOSIS — I10 Essential (primary) hypertension: Secondary | ICD-10-CM

## 2024-08-18 DIAGNOSIS — R1030 Lower abdominal pain, unspecified: Secondary | ICD-10-CM | POA: Diagnosis not present

## 2024-08-18 DIAGNOSIS — N184 Chronic kidney disease, stage 4 (severe): Secondary | ICD-10-CM | POA: Diagnosis not present

## 2024-08-18 DIAGNOSIS — E559 Vitamin D deficiency, unspecified: Secondary | ICD-10-CM

## 2024-08-18 MED ORDER — POLYETHYLENE GLYCOL 3350 17 GM/SCOOP PO POWD: 17.0000 g | Freq: Every day | ORAL | 3 refills | Status: AC

## 2024-08-18 NOTE — Progress Notes (Signed)
 Patient ID: [name], female   DOB: 1936/05/30, 88 y.o.   MRN: [id number]        Chief Complaint: follow up constipation with Left mid abd pain, CKD 4, htn, low vit d       HPI:  [name] is a 88 y.o. female here with daughter and left mid abd pains mild recurrent but persistent over the past 2 wks, without N/V, fever, blood and is passing gas, and pain improved with BM.   Taking probiotic but not helping.  Pt denies chest pain, increased sob or doe, wheezing, orthopnea, PND, increased LE swelling, palpitations, dizziness or syncope.   Pt denies polydipsia, polyuria, or new focal neuro s/s.    Pt denies fever, night sweats, loss of appetite, or other constitutional symptoms , except for wt increaase then back to baseline recently.        Wt Readings from Last 3 Encounters:  08/18/24 154 lb (69.9 kg)  08/01/24 160 lb (72.6 kg)  07/19/24 150 lb (68 kg)   BP Readings from Last 3 Encounters:  08/18/24 124/72  08/01/24 118/66  05/31/24 106/72         Past Medical History:  Diagnosis Date   Allergic rhinitis 10/30/2016   Anemia    Asthma    Breast cancer of upper-outer quadrant of left female breast (HCC) 11/08/2013   ER/PR+ Her2- Left IDC    Chronic renal insufficiency    Chronic rhinitis    Colon polyp    Diastolic dysfunction 07/10/2016   DJD (degenerative joint disease)    Dyspnea    Frozen shoulder    Full dentures    GERD (gastroesophageal reflux disease)    Hearing loss    Hypertension    Hyponatremia    Impaired glucose tolerance 07/18/2014   Memory loss    Morbid obesity (HCC)    Poor circulation    Vertigo    Wears glasses    Past Surgical History:  Procedure Laterality Date   ABDOMINAL HYSTERECTOMY     BREAST LUMPECTOMY WITH NEEDLE LOCALIZATION AND AXILLARY SENTINEL LYMPH NODE BX Left 12/04/2013   Procedure: BREAST LUMPECTOMY WITH NEEDLE LOCALIZATION AND AXILLARY SENTINEL LYMPH NODE BX;  Surgeon: Alm VEAR Angle, MD;  Location: [facility];  Service: General;  Laterality: Left;   CATARACT EXTRACTION  2009   rt   COLONOSCOPY     EYE SURGERY Bilateral    cataract surgery   KNEE ARTHROSCOPY     both   LUMBAR LAMINECTOMY/DECOMPRESSION MICRODISCECTOMY Left 12/23/2017   Procedure: Left Lumbar One-Two Laminectomy with microdiscectomy;  Surgeon: Joshua Alm RAMAN, MD;  Location: [facility] OR;  Service: Neurosurgery;  Laterality: Left;  Left L1-2 Laminectomy with microdiscectomy   TONSILLECTOMY     TOTAL KNEE ARTHROPLASTY  2002   rt   TOTAL KNEE ARTHROPLASTY  2003   left   VESICOVAGINAL FISTULA CLOSURE W/ TAH  1980    reports that she has never smoked. She has never been exposed to tobacco smoke. She has never used smokeless tobacco. She reports that she does not drink alcohol and does not use drugs. family history includes Colon cancer in her mother; Healthy in her daughter; Heart attack (age of onset: 91) in her son; Lung cancer in her brother; Stomach cancer in her mother. Allergies  Allergen Reactions   Hydrocodone  Itching   Hydrocodone -Acetaminophen  Other (See Comments)   Lasix  [Furosemide ] Other (See Comments)    Dizziness.    Tizanidine  Other (  See Comments)    Dizzy and fall   Tizanidine  Hcl Other (See Comments)   Iron  Hives and Other (See Comments)     bad constipation    Current Outpatient Medications on File Prior to Visit  Medication Sig Dispense Refill   albuterol  (VENTOLIN  HFA) 108 (90 Base) MCG/ACT inhaler Inhale 1 puff into the lungs every 6 (six) hours as needed for wheezing or shortness of breath. 18 g 5   aspirin  EC 81 MG tablet Take 81 mg by mouth every morning. Swallow whole.     Cholecalciferol (VITAMIN D3) 20 MCG (800 UNIT) TABS Take 1 tablet by mouth daily.     dapagliflozin  propanediol (FARXIGA ) 10 MG TABS tablet Take 1 tablet (10 mg total) by mouth daily before breakfast. 90 tablet 3   dicyclomine  (BENTYL ) 20 MG tablet TAKE 1 TABLET (20 MG TOTAL) BY MOUTH 3 (THREE) TIMES DAILY AS NEEDED FOR  SPASMS. 50 tablet 0   diphenoxylate -atropine  (LOMOTIL ) 2.5-0.025 MG tablet Take 1 tablet by mouth 4 (four) times daily as needed for diarrhea or loose stools. 30 tablet 1   Ensure (ENSURE) Take 1 Can by mouth 3 (three) times daily between meals. 237 mL 12   furosemide  (LASIX ) 40 MG tablet Take 1 tablet (40 mg total) by mouth every Monday, Wednesday, and Friday.     gabapentin  (NEURONTIN ) 300 MG capsule Take 1 capsule (300 mg total) by mouth 2 (two) times daily. 30 capsule 2   hydrocortisone  (ANUSOL -HC) 2.5 % rectal cream Place 1 Application rectally 2 (two) times daily. 30 g 1   Incontinence Supply Disposable (DEPEND UNDERWEAR SM/MED) MISC Use as directed four times per day 120 each 5   losartan  (COZAAR ) 50 MG tablet Take 50 mg by mouth daily.     meclizine  (ANTIVERT ) 12.5 MG tablet TAKE 1 TABLET BY MOUTH THREE TIMES A DAY AS NEEDED FOR DIZZINESS 90 tablet 2   metolazone (ZAROXOLYN) 5 MG tablet Take 5 mg by mouth once a week.     metoprolol  succinate (TOPROL -XL) 25 MG 24 hr tablet TAKE 1 TABLET (25 MG TOTAL) BY MOUTH DAILY. 90 tablet 3   montelukast  (SINGULAIR ) 10 MG tablet Take 1 tablet (10 mg total) by mouth daily. 90 tablet 2   Multiple Vitamin (MULTIVITAMIN WITH MINERALS) TABS tablet Take 1 tablet by mouth daily. Centrum Silver     pantoprazole  (PROTONIX ) 40 MG tablet TAKE 1 TABLET BY MOUTH EVERY DAY 90 tablet 3   predniSONE  (DELTASONE ) 5 MG tablet Take 1 tablet (5 mg total) by mouth daily as needed. 90 tablet 3   Turmeric (QC TUMERIC COMPLEX PO) Take 1,000 mg by mouth daily.     WIXELA INHUB 500-50 MCG/ACT AEPB INHALE 1 PUFF INTO THE LUNGS IN THE MORNING AND AT BEDTIME. 180 each 3   No current facility-administered medications on file prior to visit.        ROS:  All others reviewed and negative.  Objective        PE:  BP 124/72 (BP Location: Right Arm, Patient Position: Sitting, Cuff Size: Normal)   Pulse 75   Temp 98.7 F (37.1 C) (Oral)   Ht 5' 5 (1.651 m)   Wt 154 lb (69.9 kg)    SpO2 98%   BMI 25.63 kg/m                 Constitutional: Pt appears in NAD               HENT: Head: NCAT.  Right Ear: External ear normal.                 Left Ear: External ear normal.                Eyes: . Pupils are equal, round, and reactive to light. Conjunctivae and EOM are normal               Nose: without d/c or deformity               Neck: Neck supple. Gross normal ROM               Cardiovascular: Normal rate and regular rhythm.                 Pulmonary/Chest: Effort normal and breath sounds without rales or wheezing.                Abd:  Soft, NT, ND, + BS, no organomegaly               Neurological: Pt is alert. At baseline orientation, motor grossly intact               Skin: Skin is warm. No rashes, no other new lesions, LE edema - none               Psychiatric: Pt behavior is normal without agitation   Micro: none  Cardiac tracings I have personally interpreted today:  none  Pertinent Radiological findings (summarize): none   Lab Results  Component Value Date   WBC 8.6 03/24/2024   HGB 11.8 (L) 03/24/2024   HCT 35.8 (L) 03/24/2024   PLT 222.0 03/24/2024   GLUCOSE 120 (H) 02/28/2024   CHOL 219 (H) 08/24/2023   TRIG 153.0 (H) 08/24/2023   HDL 57.90 08/24/2023   LDLDIRECT 137.0 02/02/2022   LDLCALC 131 (H) 08/24/2023   ALT 17 02/28/2024   AST 23 02/28/2024   NA 141 02/28/2024   K 3.8 02/28/2024   CL 94 (L) 02/28/2024   CREATININE 2.31 (H) 02/28/2024   BUN 58 (H) 02/28/2024   CO2 32 02/28/2024   TSH 1.09 06/25/2023   INR 1.0 07/27/2021   HGBA1C 6.4 08/24/2023   Assessment/Plan:  Kareem CANDIS KABEL is a 88 y.o. Black or African American [2] female with  has a past medical history of Allergic rhinitis (10/30/2016), Anemia, Asthma, Breast cancer of upper-outer quadrant of left female breast (HCC) (11/08/2013), Chronic renal insufficiency, Chronic rhinitis, Colon polyp, Diastolic dysfunction (07/10/2016), DJD (degenerative joint  disease), Dyspnea, Frozen shoulder, Full dentures, GERD (gastroesophageal reflux disease), Hearing loss, Hypertension, Hyponatremia, Impaired glucose tolerance (07/18/2014), Memory loss, Morbid obesity (HCC), Poor circulation, Vertigo, and Wears glasses.  Vitamin D  deficiency Last vitamin D  Lab Results  Component Value Date   VD25OH 79.47 06/25/2023   Stable, cont oral replacement   Essential hypertension BP Readings from Last 3 Encounters:  08/18/24 124/72  08/01/24 118/66  05/31/24 106/72   Stable, pt to continue medical treatment losartan  50 mg every day, toprol  xl 25 qd   CKD (chronic kidney disease) stage 4, GFR 15-29 ml/min (HCC) Lab Results  Component Value Date   CREATININE 2.31 (H) 02/28/2024   Stable overall, cont to avoid nephrotoxins, f/u renal as planned Ckd 4  Abdominal pain Benign exam today but most c/w significant at least mild constipation; ok for miralax  17 gm po qd  Followup: Return in about 6 months (around 02/16/2025).  Lynwood Rush, MD 08/21/2024 10:36 AM [facility]

## 2024-08-18 NOTE — Patient Instructions (Signed)
 Please take all new medication as prescribed - the miralax  daily  Please continue all other medications as before, and refills have been done if requested.  Please have the pharmacy call with any other refills you may need.  Please continue your efforts at being more active, low cholesterol diet, and weight control.  Please keep your appointments with your specialists as you may have planned - Dr Jerrye renal  Please make an Appointment to return in 6 months, or sooner if needed

## 2024-08-21 ENCOUNTER — Encounter: Payer: Self-pay | Admitting: Internal Medicine

## 2024-08-21 NOTE — Assessment & Plan Note (Signed)
 BP Readings from Last 3 Encounters:  08/18/24 124/72  08/01/24 118/66  05/31/24 106/72   Stable, pt to continue medical treatment losartan  50 mg every day, toprol  xl 25 qd

## 2024-08-21 NOTE — Assessment & Plan Note (Signed)
Last vitamin D Lab Results  Component Value Date   VD25OH 79.47 06/25/2023   Stable, cont oral replacement

## 2024-08-21 NOTE — Assessment & Plan Note (Signed)
 Benign exam today but most c/w significant at least mild constipation; ok for miralax  17 gm po qd

## 2024-08-21 NOTE — Assessment & Plan Note (Addendum)
 Lab Results  Component Value Date   CREATININE 2.31 (H) 02/28/2024   Stable overall, cont to avoid nephrotoxins, f/u renal as planned Ckd 4

## 2024-08-23 ENCOUNTER — Other Ambulatory Visit: Payer: Self-pay

## 2024-08-23 NOTE — Patient Outreach (Signed)
 Complex Care Management   Visit Note  08/23/2024  Name:  [name] MRN: [id number] DOB: 26-Oct-1936  Situation: Referral received for Complex Care Management related to Heart Failure I obtained verbal consent from Patient.  Visit completed with Patient and Daysi Boggan, daughter, with patient's consent  on the phone  Background:   Past Medical History:  Diagnosis Date   Allergic rhinitis 10/30/2016   Anemia    Asthma    Breast cancer of upper-outer quadrant of left female breast (HCC) 11/08/2013   ER/PR+ Her2- Left IDC    Chronic renal insufficiency    Chronic rhinitis    Colon polyp    Diastolic dysfunction 07/10/2016   DJD (degenerative joint disease)    Dyspnea    Frozen shoulder    Full dentures    GERD (gastroesophageal reflux disease)    Hearing loss    Hypertension    Hyponatremia    Impaired glucose tolerance 07/18/2014   Memory loss    Morbid obesity (HCC)    Poor circulation    Vertigo    Wears glasses     Assessment: Patient Reported Symptoms:  Cognitive Cognitive Status: Requires Assistance Decision Making, Able to follow simple commands, Normal speech and language skills (history of memory loss)      Neurological Neurological Review of Symptoms: No symptoms reported    HEENT HEENT Symptoms Reported: No symptoms reported (daughter reports patient is getting an updated hearing aid.)      Cardiovascular Cardiovascular Symptoms Reported: Swelling in legs or feet (wears compression socks. left leg with slight swelling, but states at her baseline.) Does patient have uncontrolled Hypertension?: No Cardiovascular Management Strategies: Adequate rest, Medication therapy, Routine screening, Weight management  Respiratory Respiratory Symptoms Reported: Shortness of breath Other Respiratory Symptoms: patient reports SOB goes and comes when I am up Respiratory Management Strategies: Routine screening  Endocrine Endocrine Symptoms Reported: No symptoms  reported Is patient diabetic?: No    Gastrointestinal Gastrointestinal Symptoms Reported: No symptoms reported      Genitourinary Genitourinary Symptoms Reported: Incontinence Genitourinary Management Strategies: Incontinence garment/pad  Integumentary Integumentary Symptoms Reported: No symptoms reported    Musculoskeletal Musculoskelatal Symptoms Reviewed: Limited mobility, Difficulty walking        Psychosocial Psychosocial Symptoms Reported: No symptoms reported Additional Psychological Details: RNCM discussed LCSW is outreaching to daughter regarding caregiver resources          There were no vitals filed for this visit.  Medications Reviewed Today     Reviewed by Rustyn Conery M, RN (Registered Nurse) on 08/23/24 at 1017  Med List Status: <None>   Medication Order Taking? Sig Documenting Provider Last Dose Status Informant  albuterol  (VENTOLIN  HFA) 108 (90 Base) MCG/ACT inhaler [id number] Yes Inhale 1 puff into the lungs every 6 (six) hours as needed for wheezing or shortness of breath. [name], MD  Active   aspirin  EC 81 MG tablet [id number] Yes Take 81 mg by mouth every morning. Swallow whole. [provider]  Active Multiple Informants  Cholecalciferol (VITAMIN D3) 20 MCG (800 UNIT) TABS [id number] Yes Take 1 tablet by mouth daily. Briana Elgin LABOR, MD  Active   dapagliflozin  propanediol (FARXIGA ) 10 MG TABS tablet [id number] Yes Take 1 tablet (10 mg total) by mouth daily before breakfast. [name], MD  Active   dicyclomine  (BENTYL ) 20 MG tablet [id number] Yes TAKE 1 TABLET (20 MG TOTAL) BY MOUTH 3 (THREE) TIMES DAILY AS NEEDED FOR SPASMS. Craig Alan SAUNDERS, PA-C  Active  diphenoxylate -atropine  (LOMOTIL ) 2.5-0.025 MG tablet [id number] Yes Take 1 tablet by mouth 4 (four) times daily as needed for diarrhea or loose stools. [name], MD  Active   [facility]) [id number] Yes Take 1 Can by mouth 3 (three) times daily between meals. [name], MD  Active  Multiple Informants  furosemide  (LASIX ) 40 MG tablet [id number] Yes Take 1 tablet (40 mg total) by mouth every Monday, Wednesday, and Friday. Briana Elgin LABOR, MD  Active            Med Note EVERETTE, [place] C   Sun Feb 13, 2024  6:01 PM) Daughter brought in bottles Take 1 tablet by mouth 2 times daily  gabapentin  (NEURONTIN ) 300 MG capsule [id number] Yes Take 1 capsule (300 mg total) by mouth 2 (two) times daily. [name], MD  Active   hydrocortisone  (ANUSOL -HC) 2.5 % rectal cream [id number] Yes Place 1 Application rectally 2 (two) times daily. Craig Alan SAUNDERS, PA-C  Active   Incontinence Supply Disposable (DEPEND UNDERWEAR SM/MED) MISC [id number]  Use as directed four times per day [name], MD  Active Multiple Informants  losartan  (COZAAR ) 50 MG tablet [id number] Yes Take 50 mg by mouth daily. [provider]  Active   meclizine  (ANTIVERT ) 12.5 MG tablet [id number] Yes TAKE 1 TABLET BY MOUTH THREE TIMES A DAY AS NEEDED FOR DIZZINESS John, James W, MD  Active Multiple Informants  metolazone (ZAROXOLYN) 5 MG tablet [id number] Yes Take 5 mg by mouth once a week. [provider]  Active   metoprolol  succinate (TOPROL -XL) 25 MG 24 hr tablet [id number] Yes TAKE 1 TABLET (25 MG TOTAL) BY MOUTH DAILY. [name], MD  Active   montelukast  (SINGULAIR ) 10 MG tablet [id number] Yes Take 1 tablet (10 mg total) by mouth daily. Lorilee Sven SQUIBB, MD  Active   Multiple Vitamin (MULTIVITAMIN WITH MINERALS) TABS tablet [id number] Yes Take 1 tablet by mouth daily. Centrum Silver [provider]  Active Multiple Informants  pantoprazole  (PROTONIX ) 40 MG tablet [id number] Yes TAKE 1 TABLET BY MOUTH EVERY DAY [name], MD  Active   polyethylene glycol powder (GLYCOLAX /MIRALAX ) 17 GM/SCOOP powder [id number] Yes Take 17 g by mouth daily. Dissolve 1 capful (17g) in 4-8 ounces of liquid and take by mouth daily. [name], MD  Active   predniSONE  (DELTASONE ) 5 MG tablet [id number] Yes  Take 1 tablet (5 mg total) by mouth daily as needed. Lorilee Sven SQUIBB, MD  Active   Turmeric (QC TUMERIC COMPLEX PO) [id number] Yes Take 1,000 mg by mouth daily. [provider]  Active   NAPOLEON INHUB 500-50 MCG/ACT AEPB [id number] Yes INHALE 1 PUFF INTO THE LUNGS IN THE MORNING AND AT BEDTIME. [name], MD  Active           Recommendation:   Specialty provider follow-up daughter to call to reschedule visit with [facility] Continue Current Plan of Care  Follow Up Plan:   Telephone follow up appointment date/time:  09/25/24 at 1:00 pm  Heddy Shutter, RN, MSN, BSN, CCM [place]  [facility], Population Health Case Manager Phone: [phone]

## 2024-08-23 NOTE — Patient Instructions (Signed)
 Visit Information  Thank you for taking time to visit with me today. Please don't hesitate to contact me if I can be of assistance to you before our next scheduled appointment.  Your next care management appointment is by telephone on 09/28/24 at 11:00 am   Please call the care guide team at [phone] if you need to cancel, schedule, or reschedule an appointment.   Please call the Suicide and Crisis Lifeline: 988 call the [place]  National Suicide Prevention Lifeline: 670-175-3576 or TTY: 747 789 4981 TTY (225) 163-7910) to talk to a trained counselor if you are experiencing a Mental Health or Behavioral Health Crisis or need someone to talk to.   Heddy Shutter, RN, MSN, BSN, CCM [place]  [facility], Population Health Case Manager Phone: [phone]

## 2024-08-30 DIAGNOSIS — N184 Chronic kidney disease, stage 4 (severe): Secondary | ICD-10-CM | POA: Diagnosis not present

## 2024-09-05 ENCOUNTER — Telehealth: Payer: Self-pay

## 2024-09-05 ENCOUNTER — Other Ambulatory Visit: Payer: Self-pay | Admitting: *Deleted

## 2024-09-05 NOTE — Telephone Encounter (Signed)
 Oh yes, this is common way to treat, and I would most happily agree with Dr Joane about his recommendation.   Thanks

## 2024-09-05 NOTE — Telephone Encounter (Unsigned)
 Copied from CRM [id number]. Topic: General - Other >> Sep 05, 2024  3:32 PM Carlyon D wrote: Reason for CRM: Pt is calling stating she had gotten an injection before form provider Darleene she is asking if she is able to get these injections in her hip again also she was given something for frozen shoulders is what she called it. She is asking if pcp Dr. Norleen is ok with this?  Please give pt a call to further discuss and schedule.

## 2024-09-06 ENCOUNTER — Ambulatory Visit: Admitting: Podiatry

## 2024-09-06 NOTE — Patient Instructions (Signed)
 Visit Information  Thank you for taking time to visit with me today. Please don't hesitate to contact me if I can be of assistance to you before our next scheduled appointment.  Our next appointment is no further scheduled appointments with [facility] social worker Please call the care guide team at [phone] if you need to cancel or reschedule your appointment.    Please call the Suicide and Crisis Lifeline: 988 call the [place]  National Suicide Prevention Lifeline: 786-797-4832 or TTY: 219-223-2291 TTY (340) 692-4921) to talk to a trained counselor call 1-800-273-TALK (toll free, 24 hour hotline) go to [facility] [address], [place] [phone]) if you are experiencing a Mental Health or Behavioral Health Crisis or need someone to talk to.  Caregiver/daughter verbalized understanding of Care plan and visit instructions communicated this visit  Timoty Bourke, LCSW [place]  [facility], [facility] Licensed Clinical Social Worker  Direct Dial: [phone]

## 2024-09-06 NOTE — Patient Outreach (Signed)
 Complex Care Management   Visit Note  09/06/2024  Name:  [name] MRN: [id number] DOB: [birth year]  Situation: Referral received for Complex Care Management related to caregiver stress I obtained verbal consent from Caregiver.  Visit completed with Caregiver  on the phone  Background:   Past Medical History:  Diagnosis Date   Allergic rhinitis 10/30/2016   Anemia    Asthma    Breast cancer of upper-outer quadrant of left female breast (HCC) 11/08/2013   ER/PR+ Her2- Left IDC    Chronic renal insufficiency    Chronic rhinitis    Colon polyp    Diastolic dysfunction 07/10/2016   DJD (degenerative joint disease)    Dyspnea    Frozen shoulder    Full dentures    GERD (gastroesophageal reflux disease)    Hearing loss    Hypertension    Hyponatremia    Impaired glucose tolerance 07/18/2014   Memory loss    Morbid obesity (HCC)    Poor circulation    Vertigo    Wears glasses     Assessment: Follow up with patient's daughter to assess for community resource needs related to caregiver stress. Patient's daughter confirms that she is the primary caregiver for patient further stating that a strong connection to community supports. Patient currently receives in home aid care 3 days per week, 2 hours each visit. Patient's daughter is also a member of [employer] that provides caregiver support through [facility]. [facility] She further discussed having a strong support through her church and the [facility] where patient and daughter attend bible study weelky and daughter attends line dancing classes. Adult Day Programs discussed, however patient would need to have a diagnosis of a memory impairment. Patient referred to Neurology on 07/31/24 contact information provided-daughter agreeable to contacting  [facility] to schedule initial evaluation. Patient's daughter verbalized having no additional needs at this time. Patient Reported Symptoms:  Cognitive Cognitive Status:  Requires Assistance Decision Making, Able to follow simple commands, Normal speech and language skills Cognitive/Intellectual Conditions Management [RPT]: Other Other: history of memory loss   Health Maintenance Behaviors: Annual physical exam Healing Pattern: Average Health Facilitated by: Prayer/meditation, Rest  Neurological Neurological Review of Symptoms: No symptoms reported Neurological Comment: Referral to [facility] on 07/31/24 daughter agreeable to contacting them to schedule [phone]  HEENT HEENT Symptoms Reported: Change or loss of hearing (was able to demo new hearing aid for 4 days, decided against it. Will continue to use current hearing aids. reports that she is having a hard time hearing on the phone-daughter states that she try speaker phone) HEENT Management Strategies: Coping strategies    Cardiovascular Cardiovascular Symptoms Reported: No symptoms reported (swelling in legs and feet has improved-continues to wear her compression stockings, elevates feet often) Cardiovascular Management Strategies: Adequate rest, Medication therapy, Routine screening  Respiratory Respiratory Symptoms Reported: Shortness of breath Respiratory Management Strategies: Routine screening Respiratory Self-Management Outcome: 4 (good)  Endocrine Endocrine Symptoms Reported: No symptoms reported Is patient diabetic?: No    Gastrointestinal Gastrointestinal Symptoms Reported: No symptoms reported      Genitourinary Genitourinary Symptoms Reported: Not assessed    Integumentary Integumentary Symptoms Reported: No symptoms reported    Musculoskeletal Musculoskelatal Symptoms Reviewed: Limited mobility, Difficulty walking Additional Musculoskeletal Details: uses rollator walker, limited use of hands and arms active with  PT/OT through [facility] on range of motion-hip and knee pain-patient has a orthopedic doctor that she will follow up with-Dr. Darleene Musculoskeletal Management Strategies:  Routine screening,  Medication therapy, Medical device Musculoskeletal Self-Management Outcome: 4 (good)      Psychosocial Psychosocial Symptoms Reported: No symptoms reported Additional Psychological Details: patient's daughter active with caregivers connect-caregiver support group 1x per month at [place]-finds it very beneficial. Has a strong church family, patient receives meals on wheels daily and also has a in home aid 2 hours per day,  3 days per week. Daughter and patient attends bible study at the [facility] -daughter attends dance classes.Patient has history of mental health follow up at the [facility]-no additional appointments scheduled at this time. Discussed additional options for Adult Day Programs Behavioral Health Self-Management Outcome: 4 (good) Major Change/Loss/Stressor/Fears (CP): Medical condition, self Techniques to Cope with Loss/Stress/Change: Diversional activities, Spiritual practice(s) Quality of Family Relationships: supportive, helpful Do you feel physically threatened by others?: No    09/06/2024    PHQ2-9 Depression Screening   Little interest or pleasure in doing things    Feeling down, depressed, or hopeless    PHQ-2 - Total Score    Trouble falling or staying asleep, or sleeping too much    Feeling tired or having little energy    Poor appetite or overeating     Feeling bad about yourself - or that you are a failure or have let yourself or your family down    Trouble concentrating on things, such as reading the newspaper or watching television    Moving or speaking so slowly that other people could have noticed.  Or the opposite - being so fidgety or restless that you have been moving around a lot more than usual    Thoughts that you would be better off dead, or hurting yourself in some way    PHQ2-9 Total Score    If you checked off any problems, how difficult have these problems made it for you to do your work, take care of things at home, or get along with  other people    Depression Interventions/Treatment      There were no vitals filed for this visit.    Medications Reviewed Today     Reviewed by Ermalinda Lenn HERO, LCSW (Social Worker) on 09/05/24 at 1410  Med List Status: <None>   Medication Order Taking? Sig Documenting Provider Last Dose Status Informant  albuterol  (VENTOLIN  HFA) 108 (90 Base) MCG/ACT inhaler [id number] Yes Inhale 1 puff into the lungs every 6 (six) hours as needed for wheezing or shortness of breath. [name], MD  Active   aspirin  EC 81 MG tablet [id number] Yes Take 81 mg by mouth every morning. Swallow whole. [provider]  Active Multiple Informants  Cholecalciferol (VITAMIN D3) 20 MCG (800 UNIT) TABS [id number] Yes Take 1 tablet by mouth daily. Briana Elgin LABOR, MD  Active   dapagliflozin  propanediol (FARXIGA ) 10 MG TABS tablet [id number] Yes Take 1 tablet (10 mg total) by mouth daily before breakfast. [name], MD  Active   dicyclomine  (BENTYL ) 20 MG tablet [id number] Yes TAKE 1 TABLET (20 MG TOTAL) BY MOUTH 3 (THREE) TIMES DAILY AS NEEDED FOR SPASMS. Craig Alan SAUNDERS, PA-C  Active   diphenoxylate -atropine  (LOMOTIL ) 2.5-0.025 MG tablet [id number] Yes Take 1 tablet by mouth 4 (four) times daily as needed for diarrhea or loose stools. [name], MD  Active   Ensure [facility]) [id number] Yes Take 1 Can by mouth 3 (three) times daily between meals. [name], MD  Active Multiple Informants  furosemide  (LASIX ) 40 MG tablet [id number] Yes Take 1 tablet (40  mg total) by mouth every Monday, Wednesday, and Friday. Briana Elgin LABOR, MD  Active            Med Note EVERETTE, Kersey C   Sun Feb 13, 2024  6:01 PM) Daughter brought in bottles Take 1 tablet by mouth 2 times daily  gabapentin  (NEURONTIN ) 300 MG capsule [id number] Yes Take 1 capsule (300 mg total) by mouth 2 (two) times daily. [name], MD  Active   hydrocortisone  (ANUSOL -[facility]) 2.5 % rectal cream [id number] Yes Place 1 Application rectally 2 (two)  times daily. Craig Alan SAUNDERS, PA-C  Active   Incontinence Supply Disposable (DEPEND UNDERWEAR SM/MED) MISC [id number] Yes Use as directed four times per day [name], MD  Active Multiple Informants  losartan  (COZAAR ) 50 MG tablet [id number] Yes Take 50 mg by mouth daily. [provider]  Active   meclizine  (ANTIVERT ) 12.5 MG tablet [id number] Yes TAKE 1 TABLET BY MOUTH THREE TIMES A DAY AS NEEDED FOR DIZZINESS John, James W, MD  Active Multiple Informants  metolazone (ZAROXOLYN) 5 MG tablet [id number] Yes Take 5 mg by mouth once a week. [provider]  Active   metoprolol  succinate (TOPROL -XL) 25 MG 24 hr tablet [id number] Yes TAKE 1 TABLET (25 MG TOTAL) BY MOUTH DAILY. [name], MD  Active   montelukast  (SINGULAIR ) 10 MG tablet [id number] Yes Take 1 tablet (10 mg total) by mouth daily. Lorilee Sven SQUIBB, MD  Active   Multiple Vitamin (MULTIVITAMIN WITH MINERALS) TABS tablet [id number] Yes Take 1 tablet by mouth daily. Centrum Silver [provider]  Active Multiple Informants  pantoprazole  (PROTONIX ) 40 MG tablet [id number] Yes TAKE 1 TABLET BY MOUTH EVERY DAY [name], MD  Active   polyethylene glycol powder (GLYCOLAX /MIRALAX ) 17 GM/SCOOP powder [id number] Yes Take 17 g by mouth daily. Dissolve 1 capful (17g) in 4-8 ounces of liquid and take by mouth daily. [name], MD  Active   predniSONE  (DELTASONE ) 5 MG tablet [id number] Yes Take 1 tablet (5 mg total) by mouth daily as needed. Lorilee Sven SQUIBB, MD  Active   Turmeric (QC TUMERIC COMPLEX PO) [id number] Yes Take 1,000 mg by mouth daily. [provider]  Active   NAPOLEON INHUB 500-50 MCG/ACT AEPB [id number] Yes INHALE 1 PUFF INTO THE LUNGS IN THE MORNING AND AT BEDTIME. [name], MD  Active             Recommendation:   PCP Follow-up Specialty provider follow-up as scheduled Daughter to contact [facility] to schedule initial appointment [phone]  Follow Up Plan:   Closing From:   VBCI social work at this time-patient's daughter to call this child psychotherapist with amy additional needs  [employer], [employer] [place]  [facility], [employer] [facility] Licensed Clinical Social Worker  Direct Dial: [phone]

## 2024-09-07 NOTE — Telephone Encounter (Signed)
 Called and let Pt know

## 2024-09-15 DIAGNOSIS — D631 Anemia in chronic kidney disease: Secondary | ICD-10-CM | POA: Diagnosis not present

## 2024-09-15 DIAGNOSIS — N184 Chronic kidney disease, stage 4 (severe): Secondary | ICD-10-CM | POA: Diagnosis not present

## 2024-09-15 DIAGNOSIS — I129 Hypertensive chronic kidney disease with stage 1 through stage 4 chronic kidney disease, or unspecified chronic kidney disease: Secondary | ICD-10-CM | POA: Diagnosis not present

## 2024-09-15 DIAGNOSIS — R7303 Prediabetes: Secondary | ICD-10-CM | POA: Diagnosis not present

## 2024-09-15 DIAGNOSIS — R600 Localized edema: Secondary | ICD-10-CM | POA: Diagnosis not present

## 2024-09-15 DIAGNOSIS — N2581 Secondary hyperparathyroidism of renal origin: Secondary | ICD-10-CM | POA: Diagnosis not present

## 2024-09-15 DIAGNOSIS — R809 Proteinuria, unspecified: Secondary | ICD-10-CM | POA: Diagnosis not present

## 2024-09-18 ENCOUNTER — Ambulatory Visit: Admitting: Sports Medicine

## 2024-09-18 VITALS — BP 124/82 | HR 79 | Ht 65.0 in | Wt 154.0 lb

## 2024-09-18 DIAGNOSIS — G8929 Other chronic pain: Secondary | ICD-10-CM

## 2024-09-18 DIAGNOSIS — M25551 Pain in right hip: Secondary | ICD-10-CM | POA: Diagnosis not present

## 2024-09-18 DIAGNOSIS — M7061 Trochanteric bursitis, right hip: Secondary | ICD-10-CM

## 2024-09-18 DIAGNOSIS — M19012 Primary osteoarthritis, left shoulder: Secondary | ICD-10-CM | POA: Diagnosis not present

## 2024-09-18 DIAGNOSIS — M25512 Pain in left shoulder: Secondary | ICD-10-CM | POA: Diagnosis not present

## 2024-09-18 DIAGNOSIS — M25511 Pain in right shoulder: Secondary | ICD-10-CM | POA: Diagnosis not present

## 2024-09-18 DIAGNOSIS — M19011 Primary osteoarthritis, right shoulder: Secondary | ICD-10-CM

## 2024-09-18 NOTE — Progress Notes (Signed)
 [name] [address] [place] [address] Phone: [phone]   Assessment and Plan:     1. Chronic pain of right hip (Primary) 2. Greater trochanteric bursitis, right -Chronic with exacerbation, subsequent visit - Recurrence of right hip pain, primarily laterally.  Patient does have underlying hip osteoarthritis, however tenderness is primarily lateralized at today's visit and patient has had significant improvement in the past with greater trochanteric CSI.  Patient elected for repeat greater trochanteric CSI.  Tolerated well per note below.  CSI may temporarily increase blood pressure patient with past medical history of hypertension -Do not recommend p.o. NSAIDs or prednisone  with past medical history of chronic prednisone  use, hypertension, GI bleed, CKD  Procedure: Greater trochanteric bursal injection Side: Right  Risks explained and consent was given verbally. The site was cleaned with alcohol prep. A steroid injection was performed with patient in the lateral side-lying position at area of maximum tenderness over greater trochanter using 2mL of 1% lidocaine  without epinephrine and 1mL of kenalog  40mg /ml. This was well tolerated.  Needle was removed, hemostasis achieved, and post injection instructions were explained.  Pt was advised to call or return to clinic if these symptoms worsen or fail to improve as anticipated.    3. Chronic pain of both shoulders 4. Primary osteoarthritis of both shoulders -Chronic with exacerbation, subsequent visit - Recurrence of bilateral shoulder pain with underlying bilateral shoulder osteoarthritis - Patient has had improvements in the past with CSI.  Elected for bilateral subacromial CSI.  Tolerated well per note below.  CSI might temporarily increase blood pressure in patient with past ministry of hypertension. -Do not recommend p.o. NSAIDs or prednisone  with past medical history of  chronic prednisone  use, hypertension, GI bleed, CKD  Procedure: Subacromial Injection Side: Bilateral  Risks explained and consent was given verbally. The site was cleaned with alcohol prep. A steroid injection was performed from posterior approach using 2mL of 1% lidocaine  without epinephrine and 1mL of kenalog  40mg /ml. This was well tolerated.  Needle was removed, hemostasis achieved, and post injection instructions were explained.  Procedure was repeated on contralateral side.  Pt was advised to call or return to clinic if these symptoms worsen or fail to improve as anticipated.   Pertinent previous records reviewed include none   Follow Up: As needed.  Could consider physical therapy versus repeat CSI   Subjective:   I, Moenique Parris, am serving as a neurosurgeon for Doctor Morene Mace  Chief Complaint: left thigh pain , shoulder pain    HPI:    05/31/2024 Patient is a 88 year old female with left thigh pain. Patient states pain started Friday morning. No MOI. Tylenol  for the pain. Decreased ROM. Pain radiates from the hip down to the knee. Antalgic gait is walking with a walker. She is taking 10 mg of prednisone  has helped along with heating pad. She is not able to sleep on that side   09/18/2024 Patient states she is ready for CSI in her shoulders never went away. Hip pain is intermittent but is ready to try again    Relevant Historical Information: Hypertension, CHF, history of DVT, CKD, history of lower GI bleed, history of breast cancer, polymyalgia rheumatica  Additional pertinent review of systems negative.   Current Outpatient Medications:    albuterol  (VENTOLIN  HFA) 108 (90 Base) MCG/ACT inhaler, Inhale 1 puff into the lungs every 6 (six) hours as needed for wheezing or shortness of breath., Disp: 18  g, Rfl: 5   aspirin  EC 81 MG tablet, Take 81 mg by mouth every morning. Swallow whole., Disp: , Rfl:    Cholecalciferol (VITAMIN D3) 20 MCG (800 UNIT) TABS, Take 1 tablet by  mouth daily., Disp: , Rfl:    dapagliflozin  propanediol (FARXIGA ) 10 MG TABS tablet, Take 1 tablet (10 mg total) by mouth daily before breakfast. (Patient not taking: Reported on 09/05/2024), Disp: 90 tablet, Rfl: 3   dicyclomine  (BENTYL ) 20 MG tablet, TAKE 1 TABLET (20 MG TOTAL) BY MOUTH 3 (THREE) TIMES DAILY AS NEEDED FOR SPASMS., Disp: 50 tablet, Rfl: 0   diphenoxylate -atropine  (LOMOTIL ) 2.5-0.025 MG tablet, Take 1 tablet by mouth 4 (four) times daily as needed for diarrhea or loose stools., Disp: 30 tablet, Rfl: 1   Ensure (ENSURE), Take 1 Can by mouth 3 (three) times daily between meals., Disp: 237 mL, Rfl: 12   furosemide  (LASIX ) 40 MG tablet, Take 1 tablet (40 mg total) by mouth every Monday, Wednesday, and Friday., Disp: , Rfl:    gabapentin  (NEURONTIN ) 300 MG capsule, Take 1 capsule (300 mg total) by mouth 2 (two) times daily., Disp: 30 capsule, Rfl: 2   hydrocortisone  (ANUSOL -HC) 2.5 % rectal cream, Place 1 Application rectally 2 (two) times daily., Disp: 30 g, Rfl: 1   Incontinence Supply Disposable (DEPEND UNDERWEAR SM/MED) MISC, Use as directed four times per day, Disp: 120 each, Rfl: 5   losartan  (COZAAR ) 50 MG tablet, Take 50 mg by mouth daily., Disp: , Rfl:    meclizine  (ANTIVERT ) 12.5 MG tablet, TAKE 1 TABLET BY MOUTH THREE TIMES A DAY AS NEEDED FOR DIZZINESS, Disp: 90 tablet, Rfl: 2   metolazone (ZAROXOLYN) 5 MG tablet, Take 5 mg by mouth once a week., Disp: , Rfl:    metoprolol  succinate (TOPROL -XL) 25 MG 24 hr tablet, TAKE 1 TABLET (25 MG TOTAL) BY MOUTH DAILY., Disp: 90 tablet, Rfl: 3   montelukast  (SINGULAIR ) 10 MG tablet, Take 1 tablet (10 mg total) by mouth daily., Disp: 90 tablet, Rfl: 2   Multiple Vitamin (MULTIVITAMIN WITH MINERALS) TABS tablet, Take 1 tablet by mouth daily. Centrum Silver, Disp: , Rfl:    pantoprazole  (PROTONIX ) 40 MG tablet, TAKE 1 TABLET BY MOUTH EVERY DAY, Disp: 90 tablet, Rfl: 3   polyethylene glycol powder (GLYCOLAX /MIRALAX ) 17 GM/SCOOP powder, Take  17 g by mouth daily. Dissolve 1 capful (17g) in 4-8 ounces of liquid and take by mouth daily., Disp: 238 g, Rfl: 3   predniSONE  (DELTASONE ) 5 MG tablet, Take 1 tablet (5 mg total) by mouth daily as needed., Disp: 90 tablet, Rfl: 3   Turmeric (QC TUMERIC COMPLEX PO), Take 1,000 mg by mouth daily., Disp: , Rfl:    WIXELA INHUB 500-50 MCG/ACT AEPB, INHALE 1 PUFF INTO THE LUNGS IN THE MORNING AND AT BEDTIME., Disp: 180 each, Rfl: 3   Objective:     Vitals:   09/18/24 1453  BP: 124/82  Pulse: 79  SpO2: 98%  Weight: 154 lb (69.9 kg)  Height: 5' 5 (1.651 m)      Body mass index is 25.63 kg/m.    Physical Exam:    General: awake, alert, and oriented no acute distress, nontoxic Skin: no suspicious lesions or rashes Neuro:sensation intact distally with no deficits, normal muscle tone, no atrophy, strength 5/5 in all tested lower ext groups Psych: normal mood and affect, speech clear   Left hip: No deformity, swelling or wasting ROM Flexion 90, ext 20, IR 35, ER 40 TTP significantly greater  trochanter, mildly gluteal musculature, IT band   Gait antalgic, favoring right leg.  Using rollator for support   Bilateral shoulder:  No deformity, swelling or muscle wasting No scapular winging FF 80, abd 80, int 30, ext 60    Electronically signed by:  Odis Mace [name] 3:24 PM 09/18/24

## 2024-09-25 ENCOUNTER — Telehealth

## 2024-09-28 ENCOUNTER — Ambulatory Visit: Admitting: Family Medicine

## 2024-09-28 ENCOUNTER — Encounter: Payer: Self-pay | Admitting: Family Medicine

## 2024-09-28 VITALS — BP 122/74 | HR 75 | Temp 97.4°F | Wt 150.2 lb

## 2024-09-28 DIAGNOSIS — M109 Gout, unspecified: Secondary | ICD-10-CM

## 2024-09-28 DIAGNOSIS — R7303 Prediabetes: Secondary | ICD-10-CM | POA: Diagnosis not present

## 2024-09-28 DIAGNOSIS — J45909 Unspecified asthma, uncomplicated: Secondary | ICD-10-CM

## 2024-09-28 DIAGNOSIS — I130 Hypertensive heart and chronic kidney disease with heart failure and stage 1 through stage 4 chronic kidney disease, or unspecified chronic kidney disease: Secondary | ICD-10-CM

## 2024-09-28 DIAGNOSIS — I5032 Chronic diastolic (congestive) heart failure: Secondary | ICD-10-CM

## 2024-09-28 DIAGNOSIS — N184 Chronic kidney disease, stage 4 (severe): Secondary | ICD-10-CM

## 2024-09-28 DIAGNOSIS — H9193 Unspecified hearing loss, bilateral: Secondary | ICD-10-CM | POA: Diagnosis not present

## 2024-09-28 DIAGNOSIS — M1990 Unspecified osteoarthritis, unspecified site: Secondary | ICD-10-CM | POA: Diagnosis not present

## 2024-09-28 DIAGNOSIS — I10 Essential (primary) hypertension: Secondary | ICD-10-CM

## 2024-09-28 NOTE — Progress Notes (Unsigned)
 "  Established Patient Office Visit   Subjective  Patient ID: [name], female    DOB: [birth year]  Age: 88 y.o. MRN: [id number]  Chief Complaint  Patient presents with   Establish Care    Transfer of Care  Pt accompanied by her daughter.  Pt is an 88 yo female seen for TOC and chronic conditions.  Previously seen by Lynwood Rush, MD.  Concerns regarding miscommunication around time of pt's hospitalization earlier this yr.  Arthritis: described as 'crippling' from her neck to her toes, with gout affecting her toes. Receiving cortisone shots in her shoulders and hip. On prednisone  5 mg daily for arthritis, although prescribed as needed. Increased stiffness and decreased energy occur when not taking it, affecting her mobility. Worsening arthritis symptoms since 2019.  CKD IV:  Followed by [email or web address] Her blood pressure has been stable, and she stopped taking her blood pressure medication over a month ago. Hypertension was previously managed with medication, which was stopped over a month ago. Blood pressure is currently reported as good.  She has a history of congestive heart failure and does not currently see a cardiologist. She takes Lasix  for fluid management, although it is listed as an allergy due to causing dizziness and a previous fall. She has a history of falls, one of which was associated with a medication issue and resulted in her scooting on the floor.  She experiences asthma symptoms daily, including heavy wheezing, and uses a nebulizer. The nebulizer medication caused gastrointestinal issues, including incontinence and bleeding, which resolved upon discontinuation of the nebulizer. She has a history of incontinence that began after a fall and was exacerbated by nebulizer use. The incontinence resolved after discontinuing the nebulizer.  She wears hearing aids and reports some hearing loss. She recently tried new hearing aids but did not notice any improvement. She reports dry  mouth, particularly in the morning, and is unsure if she sleeps with her mouth open. She has been drinking more water recently.  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 She is actively involved in her community, attending church and participating in activities at the [employer] and [employer].    Patient Active Problem List   Diagnosis Date Noted   Abdominal pain 08/18/2024   Gait disorder 05/30/2024   Left hip pain 05/30/2024   Symptomatic anemia 02/13/2024   Acute lower GI bleeding 02/13/2024   Obesity, class 1 02/13/2024   Orthostasis 08/28/2023   HLD (hyperlipidemia) 06/25/2023   Dry mouth 12/19/2022   Right leg pain 10/03/2022   Degenerative lumbar spinal stenosis 09/29/2022   Scoliosis of lumbar spine 09/29/2022   Snoring 07/16/2022   Daytime somnolence 04/30/2022   B12 deficiency 02/08/2022   Primary osteoarthritis of both shoulders 02/04/2022   Carcinoma in situ of left breast 12/01/2021   Hypertensive heart and chronic kidney disease with heart failure and stage 1 through stage 4 chronic kidney disease, or unspecified chronic kidney disease (HCC) 08/08/2021   Acute metabolic encephalopathy    Sepsis with encephalopathy without septic shock (HCC)    AMS (altered mental status) 07/29/2021   Altered mental status 07/27/2021   Chronic diastolic CHF (congestive heart failure) (HCC) 07/27/2021   Right hip pain 06/27/2021   Right knee pain 06/27/2021   Acute diastolic congestive heart failure (HCC) 06/18/2021   Calcium pyrophosphate deposition disease 02/12/2021   Fibromyalgia 02/12/2021   Polymyalgia rheumatica 02/12/2021   Inflammatory arthritis 02/12/2021   Left knee pain 02/12/2021  Long term (current) use of systemic steroids 02/01/2021   Nail disorder 01/24/2021   Toe pain, right 01/24/2021   Chondrocalcinosis 12/19/2020   Primary osteoarthritis of both hands 12/19/2020   Pain in joint of left  shoulder 08/22/2020   Pain in joint of right shoulder 08/22/2020   Shoulder pain 07/31/2020   Chronic pain 04/23/2020   Supraclavicular fossa fullness 04/23/2020   Finding of neck region 04/23/2020   Low grade fever 12/07/2019   OAB (overactive bladder) 12/07/2019   Pain of right heel 12/07/2019   Overactive bladder 12/07/2019   Diarrhea 04/13/2019   Abnormal gait 08/30/2018   Generalized weakness 08/30/2018   Conjunctivitis of left eye 04/13/2018   Rash 04/13/2018   Eruption at injection site 04/13/2018   Acute bilateral deep vein thrombosis (DVT) of femoral veins (HCC) 01/31/2018   Hypokalemia 01/31/2018   Electrocardiogram abnormal 01/31/2018   Increased creatine kinase level 01/31/2018   Left leg pain 01/13/2018   Left leg swelling 01/13/2018   Hearing loss 01/13/2018   Intractable pain 12/27/2017   Arachnoiditis 12/27/2017   Pain 12/27/2017   Hyponatremia    S/P lumbar laminectomy 12/23/2017   History of lumbar laminectomy 12/23/2017   Wheezing 12/10/2017   Itching 11/22/2017   Urinary frequency 11/22/2017   Increased frequency of urination 11/22/2017   Degeneration of lumbar intervertebral disc 11/05/2017   Sciatica 09/04/2017   Asthma exacerbation 08/31/2017   Acute asthma 08/31/2017   Flare of rheumatoid arthritis (HCC) 05/11/2017   Articular gout 05/01/2017   Vertigo 10/30/2016   Allergic rhinitis 10/30/2016   Incontinence of feces 07/31/2016   Hemorrhoids 07/31/2016   Hematochezia 07/31/2016   Peripheral venous insufficiency 07/10/2016   Diastolic dysfunction 07/10/2016   Peripheral edema 07/10/2016   Left ankle pain 06/02/2016   Neck pain 03/10/2016   Low back pain 03/10/2016   Right cervical radiculopathy 11/13/2015   Bradycardia 03/15/2015   Joint pain 12/18/2014   Fever 11/27/2014   Impaired glucose tolerance 07/18/2014   Right lumbar radiculopathy 07/18/2014   Left lumbar radiculopathy  07/18/2014   Encounter for well adult exam with abnormal findings 02/20/2014   Malignant neoplasm of upper-outer quadrant of female breast (HCC) 11/08/2013   Diverticular disease 07/09/2012   Headache 06/30/2012   Anemia 06/29/2012   Dyspnea 03/11/2012   Cough 03/11/2012   CKD (chronic kidney disease) stage 4, GFR 15-29 ml/min (HCC) 05/18/2011   Chronic kidney disease, stage 4 (severe) (HCC) 05/18/2011   Gastroesophageal reflux disease 07/05/2008   Vitamin D  deficiency 12/19/2007   Morbid obesity (HCC) 12/16/2007   Essential hypertension 12/16/2007   DEGENERATIVE JOINT DISEASE 12/16/2007   Osteoarthritis 12/16/2007   Past Medical History:  Diagnosis Date   Allergic rhinitis 10/30/2016   Anemia    Asthma    Breast cancer of upper-outer quadrant of left female breast (HCC) 11/08/2013   ER/PR+ Her2- Left IDC    Chronic renal insufficiency    Chronic rhinitis    Colon polyp    Diastolic dysfunction 07/10/2016   DJD (degenerative joint disease)    Dyspnea    Frozen shoulder    Full dentures    GERD (gastroesophageal reflux disease)    Hearing loss    Hypertension    Hyponatremia    Impaired glucose tolerance 07/18/2014   Memory loss    Morbid obesity (HCC)    Poor circulation    Vertigo    Wears glasses    Past Surgical History:  Procedure Laterality Date   ABDOMINAL  HYSTERECTOMY     BREAST LUMPECTOMY WITH NEEDLE LOCALIZATION AND AXILLARY SENTINEL LYMPH NODE BX Left 12/04/2013   Procedure: BREAST LUMPECTOMY WITH NEEDLE LOCALIZATION AND AXILLARY SENTINEL LYMPH NODE BX;  Surgeon: Alm VEAR Angle, MD;  Location: [facility];  Service: General;  Laterality: Left;   CATARACT EXTRACTION  2009   rt   COLONOSCOPY     EYE SURGERY Bilateral    cataract surgery   KNEE ARTHROSCOPY     both   LUMBAR LAMINECTOMY/DECOMPRESSION MICRODISCECTOMY Left 12/23/2017   Procedure: Left Lumbar One-Two Laminectomy with microdiscectomy;   Surgeon: Joshua Alm RAMAN, MD;  Location: [facility] OR;  Service: Neurosurgery;  Laterality: Left;  Left L1-2 Laminectomy with microdiscectomy   TONSILLECTOMY     TOTAL KNEE ARTHROPLASTY  2002   rt   TOTAL KNEE ARTHROPLASTY  2003   left   VESICOVAGINAL FISTULA CLOSURE W/ TAH  1980   Social History   Tobacco Use   Smoking status: Never    Passive exposure: Never   Smokeless tobacco: Never  Vaping Use   Vaping status: Never Used  Substance Use Topics   Alcohol use: No    Alcohol/week: 0.0 standard drinks of alcohol   Drug use: No   Family History  Problem Relation Age of Onset   Colon cancer Mother        in her 80's   Stomach cancer Mother    Lung cancer Brother        was a smoker   Heart attack Son 55   Healthy Daughter    Allergies  Allergen Reactions   Hydrocodone  Itching   Hydrocodone -Acetaminophen  Other (See Comments)   Lasix  [Furosemide ] Other (See Comments)    Dizziness.    Tizanidine  Other (See Comments)    Dizzy and fall   Tizanidine  Hcl Other (See Comments)   Iron  Hives and Other (See Comments)     bad constipation     ROS Negative unless stated above    Objective:     BP 122/74 (BP Location: Right Arm, Patient Position: Sitting, Cuff Size: Normal)   Temp (!) 97.4 F (36.3 C) (Oral)   Wt 150 lb 3.2 oz (68.1 kg)   BMI 24.99 kg/m  BP Readings from Last 3 Encounters:  09/28/24 122/74  09/18/24 124/82  08/18/24 124/72   Wt Readings from Last 3 Encounters:  09/28/24 150 lb 3.2 oz (68.1 kg)  09/18/24 154 lb (69.9 kg)  08/18/24 154 lb (69.9 kg)      Physical Exam     08/18/2024    2:23 PM 08/01/2024    9:36 AM 06/27/2024    2:06 PM  Depression screen PHQ 2/9  Decreased Interest 0 0 0  Down, Depressed, Hopeless 0 0 0  PHQ - 2 Score 0 0 0  Altered sleeping 0  0  Tired, decreased energy 0  0  Change in appetite 0  0  Feeling bad or failure about yourself  0  0  Trouble concentrating 0  0  Moving slowly or fidgety/restless 0   3  Suicidal thoughts 0  0  PHQ-9 Score 0   3   Difficult doing work/chores Not difficult at all  Extremely dIfficult     Data saved with a previous flowsheet row definition      12/15/2022    2:53 PM 02/04/2017   10:00 AM  GAD 7 : Generalized Anxiety Score  Nervous, Anxious, on Edge 0 0  Control/stop worrying 0 0  Worry too much - different things 0 0  Trouble relaxing 0 0  Restless 0 0  Easily annoyed or irritable 0 0  Afraid - awful might happen 0 0  Total GAD 7 Score 0 0  Anxiety Difficulty Not difficult at all      No results found for any visits on 09/28/24.    Assessment & Plan:   Chronic diastolic CHF (congestive heart failure) (HCC) -     Ambulatory referral to Cardiology  Moderate asthma, unspecified whether complicated, unspecified whether persistent -     Pulmonary Visit  Essential hypertension  CKD (chronic kidney disease) stage 4, GFR 15-29 ml/min (HCC)  Prediabetes  Bilateral hearing loss, unspecified hearing loss type  Gout of multiple sites, unspecified cause, unspecified chronicity  Arthritis    Return in about 6 weeks (around 11/09/2024) for chronic conditions.   Clotilda JONELLE Single, MD "

## 2024-09-28 NOTE — Patient Instructions (Signed)
 It was nice meeting you all today!  We can try to touch bases again around the beginning of the year.

## 2024-10-06 ENCOUNTER — Telehealth: Payer: Self-pay

## 2024-10-06 NOTE — Patient Instructions (Signed)
 [name] - I am sorry I was unable to reach you today for our scheduled appointment. I work with Mercer Clotilda SAUNDERS, MD and am calling to support your healthcare needs. Please contact Juana Wallace RNCM at [phone] at your earliest convenience. I look forward to speaking with you soon.   Thank you,  Warren Quivers RN CM Population Health-Complex Care Management [facility] [phone]

## 2024-10-12 ENCOUNTER — Encounter

## 2024-11-01 ENCOUNTER — Telehealth: Payer: Self-pay

## 2024-11-01 NOTE — Patient Instructions (Signed)
 [name] - I am sorry I was unable to reach you today for our scheduled appointment. I work with Mercer Clotilda SAUNDERS, MD and am calling to support your healthcare needs. Please contact me at [phone] at your earliest convenience. I look forward to speaking with you soon.   Thank you,   Heddy Shutter, RN, MSN, BSN, CCM [place]  [facility], Population Health Case Manager Phone: [phone]

## 2024-11-02 ENCOUNTER — Encounter: Payer: Self-pay | Admitting: Internal Medicine

## 2024-11-02 ENCOUNTER — Ambulatory Visit: Admitting: Internal Medicine

## 2024-11-02 ENCOUNTER — Ambulatory Visit

## 2024-11-02 VITALS — BP 124/84 | HR 94 | Temp 97.1°F | Ht 61.0 in | Wt 156.2 lb

## 2024-11-02 DIAGNOSIS — R058 Other specified cough: Secondary | ICD-10-CM | POA: Diagnosis not present

## 2024-11-02 DIAGNOSIS — J45909 Unspecified asthma, uncomplicated: Secondary | ICD-10-CM

## 2024-11-02 MED ORDER — PANTOPRAZOLE SODIUM 40 MG PO TBEC: 40.0000 mg | DELAYED_RELEASE_TABLET | Freq: Every day | ORAL | 2 refills | Status: AC

## 2024-11-02 MED ORDER — FAMOTIDINE 20 MG PO TABS: ORAL_TABLET | ORAL | 11 refills | Status: AC

## 2024-11-02 MED ORDER — BUDESONIDE-FORMOTEROL FUMARATE 80-4.5 MCG/ACT IN AERO: INHALATION_SPRAY | RESPIRATORY_TRACT | 12 refills | Status: AC

## 2024-11-02 NOTE — Assessment & Plan Note (Addendum)
 Max gerd rx 11/02/2024 >>>   Upper airway cough syndrome (previously labeled PNDS),  is so named because it's frequently impossible to sort out how much is  CR/sinusitis with freq throat clearing (which can be related to primary GERD)   vs  causing  secondary ( extra esophageal)  GERD from wide swings in gastric pressure that occur with throat clearing, often  promoting self use of mint and menthol  lozenges that reduce the lower esophageal sphincter tone and exacerbate the problem further in a cyclical fashion.   These are the same pts (now being labeled as having irritable larynx syndrome by some cough centers) who not infrequently have a history of having failed to tolerate ace inhibitors,  dry powder inhalers(esp wixella)  or biphosphonates or report having atypical/extraesophageal reflux symptoms from LPR (globus, throat clearing)  that don't respond to standard doses of PPI  and are easily confused as having aecopd or asthma flares by even experienced allergists/ pulmonologists (myself included).    >>> try low dose symbicort  instead of wixella in any dose (avoid DPI's altogether for now)  >>> max acid suppression with ppi ac q am and H2 q pm   >>> GERD diet reviewed, bed blocks rec        Each maintenance medication was reviewed in detail including emphasizing most importantly the difference between maintenance and prns and under what circumstances the prns are to be triggered using an action plan format where appropriate.  Total time for H and P, chart review, counseling, reviewing hfa device(s) and generating customized AVS unique to this office visit / same day charting = 45 min / pt not seen in > 3 y  with  multiple  refractory respiratory  symptoms of uncertain etiology

## 2024-11-02 NOTE — Patient Instructions (Addendum)
 Stop [name]   Symbicort  80 Take 2 puffs first thing in am and then another 2 puffs about 12 hours later.    Use your albuterol  as a rescue medication to be used if you can't catch your breath by resting, slowing your pace,  or doing a relaxed purse lip breathing pattern.  - The less you use it, the better it will work when you need it. - Ok to use up to 2 puffs  every 4 hours if you must but call for  appointment if use goes up over your usual need - Don't leave home without it !!  (think of it like a spare tire or starter fluid for your car)   Also  Ok to try albuterol  15 min before an activity (on alternating days)  that you know would usually make you short of breath and see if it makes any difference and if makes none then don't take albuterol  after activity unless you can't catch your breath as this means it's the resting that helps, not the albuterol .      Pantoprazole  (protonix ) 40 mg   Take  30-60 min before first meal of the day and Pepcid  (famotidine )  20 mg after supper until return to office - this is the best way to tell whether stomach acid is contributing to your problem.    GERD (REFLUX)  is an extremely common cause of respiratory symptoms just like yours , many times with no obvious heartburn at all.    It can be treated with medication, but also with lifestyle changes including elevation of the head of your bed (ideally with 6 -8inch blocks under the headboard of your bed),  Smoking cessation, avoidance of late meals, excessive alcohol, and avoid fatty foods, chocolate, peppermint, colas, red wine, and acidic juices such as orange juice.  NO MINT OR MENTHOL  PRODUCTS SO NO COUGH DROPS  USE SUGARLESS CANDY INSTEAD (Jolley ranchers or Stover's or Life Savers) or even ice chips will also do - the key is to swallow to prevent all throat clearing. NO OIL BASED VITAMINS - use powdered substitutes.  Avoid fish oil when coughing.    Please schedule a follow up visit in 3 months but  call sooner if needed - bring inhalers

## 2024-11-02 NOTE — Progress Notes (Signed)
 "    Subjective:    Patient ID: [name], female    DOB: 1936-08-25    MRN: [id number]    Brief patient profile:  65 yobf never smoker with morbid obesity complicated by hypertension and tendency to fluid retention controlled on spironolactone  and hydrochlorothiazide  daily.    11/30/2011 f/u ov/[name] cc   throat clearing comes and goes - no new complaints today. No excess mucus, just a sense of too much throat mucus some better with prn allegra. No sob/wheeze. rec GERD diet reviewed See calendar for specific medication instructions  If throat clearing persists ok to try pepcid   Ac 20 mg after bfast and at bedtime and call if not improving.   03/11/2012 f/u ov/[name] did not follow any of the instructions, lost calendar,  and now cc doe with dry cough and audible wheeze daily, no overt sinus or hb symptoms, no variability or pattern x much more day than night.  >rx prilosec/Pepcid    03/25/2012  NP ov. Last visit. Patient was having more difficulty with cough and wheezing. Patient was treated for  Upper airway. Patient secondary to reflux with Prilosec and Pepcid . Patient has noticed improvement with decreased cough and wheezing. She feels that her cough is almost gone completely. Labs done last visit showed worsening renal insufficiency with serum creatinine up to 1.8. We reviewed this in detail today and have advised that she will stop her nonsteroidals and her spironolactone . She is agreed to come back for short-term followup in 2 weeks to have repeat labs. She denies any exertional chest pain, hemoptysis, orthopnea, PND, or increased leg swelling >>sulindac  and aldactone  stopped   04/06/2012 NP ov Returns for labs check  Renal insufficiency w/ slow tr up of scr .  Last ov , sulindac  and aldactone  stopped  No increased edema. B/p controlled.  Lab: scr is 1.3  rec Remain off  Sulindac   And  Spironolactone .  Avoid NSIADS - motrin, advil , aleve      10/04/23 [name] eval for  doe x 3 m  Asthma Increased shortness of breath over the last three months, even at rest. Wheezing noted by family members. No recent viral infections. Currently on Wixela inhaler (1 puff twice daily), Albuterol  inhaler as needed, Montelukast  daily, and Prednisone  5mg  daily. Labs show elevated bicarb levels suggesting possible CO2 retention.   -Consider switching from Wixela inhaler to nebulizer treatment regimen for better medication delivery. Start with short-acting nebulizer treatment before Wixela inhaler, and consider long-acting nebulizer treatment if needed.   Gastroesophageal Reflux Disease (GERD) Reports heartburn and reflux symptoms in the last two weeks. Currently on Protonix  (pantoprazole ) daily. -Continue Protonix  daily.   Snoring -Order home sleep study to evaluate for possible sleep apnea>  Pos so Olalere rec CPAP    11/02/2024  f/u ov/[name] re: breathing worse for a year assoc with subj wheeze and and noct cough     maint on ppi with or after bfast  and wixella  - says  neb caused diarrhea fm sure about this, has to help her use MDI  Chief Complaint  Patient presents with   Wheezing    Persistent wheeze x one year.  Some cough at night.   Dyspnea:  rollator bound  room to room doe with audible wheeze Cough: mostly at night sev hours p hs  Sleeping: has hosp bed still coughs  even with 30 degrees elevation  SABA use: ? Helping (says ventolin  works the best but appears to be swallowing  it  02: none  Wheezing appears to resolve s rx if relaxes  for a minute    No obvious day to day or daytime variability or assoc excess/ purulent sputum or mucus plugs or hemoptysis or cp or chest tightness,   or overt sinus or hb symptoms.    Also denies any obvious fluctuation of symptoms with weather or environmental changes or other aggravating or alleviating factors except as outlined above   No unusual exposure hx or h/o childhood pna/ asthma or knowledge of premature birth.  Current  Allergies, Complete Past Medical History, Past Surgical History, Family History, and Social History were reviewed in [name] record.  ROS  The following are not active complaints unless bolded Hoarseness, sore throat, dysphagia, dental problems, itching, sneezing,  nasal congestion or discharge of excess mucus or purulent secretions, ear ache,   fever, chills, sweats, unintended wt loss or wt gain, classically pleuritic or exertional cp,  orthopnea pnd or arm/hand swelling  or leg swelling, presyncope, palpitations, abdominal pain, anorexia, nausea, vomiting, diarrhea  or change in bowel habits or change in bladder habits, change in stools or change in urine, dysuria, hematuria,  rash, arthralgias, visual complaints, headache, numbness, weakness or ataxia or problems with walking or coordination,  change in mood or  memory.          Outpatient Medications Prior to Visit  Medication Sig Dispense Refill   albuterol  (VENTOLIN  HFA) 108 (90 Base) MCG/ACT inhaler Inhale 1 puff into the lungs every 6 (six) hours as needed for wheezing or shortness of breath. 18 g 5   aspirin  EC 81 MG tablet Take 81 mg by mouth every morning. Swallow whole.     Cholecalciferol (VITAMIN D3) 20 MCG (800 UNIT) TABS Take 1 tablet by mouth daily.     dapagliflozin  propanediol (FARXIGA ) 10 MG TABS tablet Take 1 tablet (10 mg total) by mouth daily before breakfast. 90 tablet 3   dicyclomine  (BENTYL ) 20 MG tablet TAKE 1 TABLET (20 MG TOTAL) BY MOUTH 3 (THREE) TIMES DAILY AS NEEDED FOR SPASMS. 50 tablet 0   diphenoxylate -atropine  (LOMOTIL ) 2.5-0.025 MG tablet Take 1 tablet by mouth 4 (four) times daily as needed for diarrhea or loose stools. 30 tablet 1   Ensure (ENSURE) Take 1 Can by mouth 3 (three) times daily between meals. 237 mL 12   furosemide  (LASIX ) 40 MG tablet Take 1 tablet (40 mg total) by mouth every Monday, Wednesday, and Friday.     gabapentin  (NEURONTIN ) 300 MG capsule Take 1 capsule (300  mg total) by mouth 2 (two) times daily. 30 capsule 2   hydrocortisone  (ANUSOL -HC) 2.5 % rectal cream Place 1 Application rectally 2 (two) times daily. 30 g 1   Incontinence Supply Disposable (DEPEND UNDERWEAR SM/MED) MISC Use as directed four times per day 120 each 5   losartan  (COZAAR ) 50 MG tablet Take 50 mg by mouth daily.     meclizine  (ANTIVERT ) 12.5 MG tablet TAKE 1 TABLET BY MOUTH THREE TIMES A DAY AS NEEDED FOR DIZZINESS 90 tablet 2   montelukast  (SINGULAIR ) 10 MG tablet Take 1 tablet (10 mg total) by mouth daily. 90 tablet 2   Multiple Vitamin (MULTIVITAMIN WITH MINERALS) TABS tablet Take 1 tablet by mouth daily. Centrum Silver     polyethylene glycol powder (GLYCOLAX /MIRALAX ) 17 GM/SCOOP powder Take 17 g by mouth daily. Dissolve 1 capful (17g) in 4-8 ounces of liquid and take by mouth daily. 238 g 3   predniSONE  (DELTASONE ) 5  MG tablet Take 1 tablet (5 mg total) by mouth daily as needed. 90 tablet 3   Turmeric (QC TUMERIC COMPLEX PO) Take 1,000 mg by mouth daily.     metolazone (ZAROXOLYN) 5 MG tablet Take 5 mg by mouth once a week.     metoprolol  succinate (TOPROL -XL) 25 MG 24 hr tablet TAKE 1 TABLET (25 MG TOTAL) BY MOUTH DAILY. 90 tablet 3   pantoprazole  (PROTONIX ) 40 MG tablet TAKE 1 TABLET BY MOUTH EVERY DAY 90 tablet 3   WIXELA INHUB 500-50 MCG/ACT AEPB INHALE 1 PUFF INTO THE LUNGS IN THE MORNING AND AT BEDTIME. 180 each 3   No facility-administered medications prior to visit.              Past Medical History:  Colon polyps, h/o - colonoscopy 04/08/2005  CHRONIC RHINITIS (ICD-472.0)  - tried immunotherapy 06/1999 MORBID OBESITY (ICD-278.01)  - Target wt = 163 for BMI < 30  DEGENERATIVE JOINT DISEASE (ICD-715.90).....................SABRASABRAAlusio/ [facility]  HYPERTENSION (ICD-401.9)  CHRONIC RENAL INSUFFICIENCY  - Creat 1.5 February 12, 2010 , >1.3 July 02, 2010 (off NSAIDS)  HEALTH MAINTENANCE.........................................................SABRAWert  - dt  08/2007 - Pneumovax 08/2003 age 35  - CPX  10/13/2012  - Med calendar lost 08/05/11 > referred back for new one>>>08/24/2011 , 03/25/2012  -colon 2011 -[facility]  GYN Care.................................................................................[facility]  - Bone density/mammography per gyn  -BMD 2011-nml  MEMORY LOSS  -MMSE July 01, 2010 >>30/30  -Clock Draw July 01, 2010 >>nml  -RPR -neg, B12/Folate nml    Past Surgical History:  Cataract durgery (rt eye) 2009  Hysterectomy 1980  Right TKR 2002  Left TKR 2003    Family History:  Positive for heart disease in remote members of her family  only. No breast or colon cancer in her family to her knowledge. Her  brother was a smoker had lung cancer.    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  News and Record previously Never smoker  No ETOH       Objective:   Physical Exam  11/02/2024  156   wt 165 October 30, 2008 >>> 168 November 18, 2009 >169 08/24/2011 > 11/30/2011  166 > 03/11/2012  166 >167 03/25/2012 >>164 04/06/2012 > 06/29/2012  162 > 162  10/13/2012 > 04/26/2013  160     Vital signs reviewed  11/02/2024  - Note at rest 02 sats  98% on RA   General appearance:    amb bf using rollator    HEENT : Oropharynx  clear      Nasal turbinates nl    NECK :  without  apparent JVD/ palpable Nodes/TM    LUNGS: no acc muscle use,  Nl contour chest which is clear to A and P bilaterally without cough on insp or exp maneuvers   CV:  RRR  no s3 or murmur or increase in P2, and no edema   ABD:  soft and nontender   MS:   ext warm without deformities Or obvious joint restrictions  calf tenderness, cyanosis or clubbing    SKIN: warm and dry without lesions    NEURO:  alert, approp, nl sensorium with  no motor or cerebellar deficits apparent.    CXR PA and Lateral:   11/02/2024 :    I personally reviewed images and impression is as follows:      Relatively small lung volumes/ no acute findings      Assessment &  Plan:   Assessment & Plan Acute asthma Changed wixella to symbicort  80 2bid 11/02/2024 >>>   More approp rx with saba:  I spent extra time with pt today reviewing appropriate use of albuterol  for prn use on exertion with the following points: 1) saba is for relief of sob that does not improve by walking a slower pace or resting but rather if the pt does not improve after trying this first. 2) If the pt is convinced, as many are, that saba helps recover from activity faster then it's easy to tell if this is the case by re-challenging : ie stop, take the inhaler, then p 5 minutes try the exact same activity (intensity of workload) that just caused the symptoms and see if they are substantially diminished or not after saba 3) if there is an activity that reproducibly causes the symptoms, try the saba 15 min before the activity on alternate days   If in fact the saba really does help, then fine to continue to use it prn but advised may need to look closer at the maintenance regimen being used to achieve better control of airways disease with exertion.   However, I am not  convinced that any or all of  her symptoms are actually  asthma especially since her wheezing and cough resolved spontaneously s any treatment at all and frainkly it is more likely a form of UACS/ VCD than true asthma  So will train on use of symbicort  80 2bid and regroup in 4 weeks with all meds in hand using a trust but verify approach to confirm accurate Medication  Reconciliation The principal here is that until we are certain that the  patients are doing what we've asked, it makes no sense to ask them to do more.    Upper airway cough syndrome Max gerd rx 11/02/2024 >>>   Upper airway cough syndrome (previously labeled PNDS),  is so named because it's frequently impossible to sort out how much is  CR/sinusitis with freq throat clearing (which can be related to  primary GERD)   vs  causing  secondary ( extra esophageal)  GERD from wide swings in gastric pressure that occur with throat clearing, often  promoting self use of mint and menthol  lozenges that reduce the lower esophageal sphincter tone and exacerbate the problem further in a cyclical fashion.   These are the same pts (now being labeled as having irritable larynx syndrome by some cough centers) who not infrequently have a history of having failed to tolerate ace inhibitors,  dry powder inhalers(esp wixella)  or biphosphonates or report having atypical/extraesophageal reflux symptoms from LPR (globus, throat clearing)  that don't respond to standard doses of PPI  and are easily confused as having aecopd or asthma flares by even experienced allergists/ pulmonologists (myself included).    >>> try low dose symbicort  instead of wixella in any dose (avoid DPI's altogether for now)  >>> max acid suppression with ppi ac q am and H2 q pm   >>> GERD diet reviewed, bed blocks rec        Each maintenance medication was reviewed in detail including emphasizing most importantly the difference between maintenance and prns and under what circumstances the prns are to be triggered using an action plan format where appropriate.  Total time for H and P, chart review, counseling, reviewing hfa device(s) and generating customized AVS unique to this office visit / same day charting = 45 min / pt not seen in >  3 y  with  multiple  refractory respiratory  symptoms of uncertain etiology          AVS  Patient Instructions  Stop wixella   Symbicort  80 Take 2 puffs first thing in am and then another 2 puffs about 12 hours later.    Use your albuterol  as a rescue medication to be used if you can't catch your breath by resting, slowing your pace,  or doing a relaxed purse lip breathing pattern.  - The less you use it, the better it will work when you need it. - Ok to use up to 2 puffs  every 4 hours if you must  but call for  appointment if use goes up over your usual need - Don't leave home without it !!  (think of it like a spare tire or starter fluid for your car)   Also  Ok to try albuterol  15 min before an activity (on alternating days)  that you know would usually make you short of breath and see if it makes any difference and if makes none then don't take albuterol  after activity unless you can't catch your breath as this means it's the resting that helps, not the albuterol .      Pantoprazole  (protonix ) 40 mg   Take  30-60 min before first meal of the day and Pepcid  (famotidine )  20 mg after supper until return to office - this is the best way to tell whether stomach acid is contributing to your problem.    GERD (REFLUX)  is an extremely common cause of respiratory symptoms just like yours , many times with no obvious heartburn at all.    It can be treated with medication, but also with lifestyle changes including elevation of the head of your bed (ideally with 6 -8inch blocks under the headboard of your bed),  Smoking cessation, avoidance of late meals, excessive alcohol, and avoid fatty foods, chocolate, peppermint, colas, red wine, and acidic juices such as orange juice.  NO MINT OR MENTHOL  PRODUCTS SO NO COUGH DROPS  USE SUGARLESS CANDY INSTEAD (Jolley ranchers or Stover's or Life Savers) or even ice chips will also do - the key is to swallow to prevent all throat clearing. NO OIL BASED VITAMINS - use powdered substitutes.  Avoid fish oil when coughing.    Please schedule a follow up visit in 3 months but call sooner if needed - bring inhalers      Ozell America, MD 11/02/2024  "

## 2024-11-02 NOTE — Assessment & Plan Note (Addendum)
 Changed wixella to symbicort  80 2bid 11/02/2024 >>>   More approp rx with saba:  I spent extra time with pt today reviewing appropriate use of albuterol  for prn use on exertion with the following points: 1) saba is for relief of sob that does not improve by walking a slower pace or resting but rather if the pt does not improve after trying this first. 2) If the pt is convinced, as many are, that saba helps recover from activity faster then it's easy to tell if this is the case by re-challenging : ie stop, take the inhaler, then p 5 minutes try the exact same activity (intensity of workload) that just caused the symptoms and see if they are substantially diminished or not after saba 3) if there is an activity that reproducibly causes the symptoms, try the saba 15 min before the activity on alternate days   If in fact the saba really does help, then fine to continue to use it prn but advised may need to look closer at the maintenance regimen being used to achieve better control of airways disease with exertion.   However, I am not  convinced that any or all of  her symptoms are actually  asthma especially since her wheezing and cough resolved spontaneously s any treatment at all and frainkly it is more likely a form of UACS/ VCD than true asthma  So will train on use of symbicort  80 2bid and regroup in 4 weeks with all meds in hand using a trust but verify approach to confirm accurate Medication  Reconciliation The principal here is that until we are certain that the  patients are doing what we've asked, it makes no sense to ask them to do more.

## 2024-11-08 ENCOUNTER — Telehealth: Payer: Self-pay

## 2024-11-08 NOTE — Patient Instructions (Signed)
 Visit Information  Thank you for taking time to visit with me today. Please don't hesitate to contact me if I can be of assistance to you before our next scheduled appointment.  Your next care management appointment is by telephone on 11/28/24 at 1:00 pm with [name] Levesque, RNCM.   Please call the care guide team at [phone] if you need to cancel, schedule, or reschedule an appointment.   Please call the Suicide and Crisis Lifeline: 988 call the [place]  National Suicide Prevention Lifeline: 640-043-6745 or TTY: 972-106-8761 TTY [phone]) to talk to a trained counselor if you are experiencing a Mental Health or Behavioral Health Crisis or need someone to talk to.   Heddy Shutter, RN, MSN, BSN, CCM [place]  [facility], Population Health Case Manager Phone: [phone]

## 2024-11-08 NOTE — Patient Outreach (Signed)
 Complex Care Management   Visit Note  11/10/2024  Name:  [name] MRN: [id number] DOB: 15-Aug-1936  Situation: Referral received for Complex Care Management related to Heart Failure I obtained verbal consent from Patient.  Visit completed with Patient and daughter  on the phone  Background:   Past Medical History:  Diagnosis Date   Allergic rhinitis 10/30/2016   Anemia    Asthma    Breast cancer of upper-outer quadrant of left female breast (HCC) 11/08/2013   ER/PR+ Her2- Left IDC    Chronic renal insufficiency    Chronic rhinitis    Colon polyp    Diastolic dysfunction 07/10/2016   DJD (degenerative joint disease)    Dyspnea    Frozen shoulder    Full dentures    GERD (gastroesophageal reflux disease)    Hearing loss    Hypertension    Hyponatremia    Impaired glucose tolerance 07/18/2014   Memory loss    Morbid obesity (HCC)    Poor circulation    Vertigo    Wears glasses     Assessment: Patient Reported Symptoms:  Cognitive Cognitive Status: Able to follow simple commands, Normal speech and language skills, No symptoms reported      Neurological Neurological Review of Symptoms: No symptoms reported    HEENT HEENT Symptoms Reported: No symptoms reported      Cardiovascular Cardiovascular Symptoms Reported: No symptoms reported (continues to wear compression stockings) Weight: 150 lb (68 kg)  Respiratory Respiratory Symptoms Reported: No symptoms reported    Endocrine Endocrine Symptoms Reported: No symptoms reported Is patient diabetic?: No    Gastrointestinal Gastrointestinal Symptoms Reported: No symptoms reported      Genitourinary Genitourinary Symptoms Reported: No symptoms reported Additional Genitourinary Details: Continues to see Dr. Jerrye at [facility]. upcoming appointment 11/21/24.    Integumentary Integumentary Symptoms Reported: No symptoms reported    Musculoskeletal Musculoskelatal Symptoms Reviewed: Limited  mobility Additional Musculoskeletal Details: Rollator walker        Psychosocial Psychosocial Symptoms Reported: No symptoms reported         Today's Vitals   11/08/24 1226  BP: (!) 101/55  Pulse: 85  Weight: 150 lb (68 kg)     Medications Reviewed Today     Reviewed by Jamill Wetmore M, RN (Registered Nurse) on 11/10/24 at [phone]  Med List Status: <None>   Medication Order Taking? Sig Documenting Provider Last Dose Status Informant  albuterol  (VENTOLIN  HFA) 108 (90 Base) MCG/ACT inhaler [id number] Yes Inhale 1 puff into the lungs every 6 (six) hours as needed for wheezing or shortness of breath. [name], MD  Active   aspirin  EC 81 MG tablet [id number] Yes Take 81 mg by mouth every morning. Swallow whole. [provider]  Active Multiple Informants  budesonide -formoterol  (SYMBICORT ) 80-4.5 MCG/ACT inhaler [id number] Yes Take 2 puffs first thing in am and then another 2 puffs about 12 hours later. Darlean Ozell NOVAK, MD  Active   Calcium-Magnesium -Zinc-Vit D3 (CALCIUM-MAGNESIUM -ZINC-D3 PO) [id number] Yes Take by mouth. [provider]  Active   Cholecalciferol (VITAMIN D3) 20 MCG (800 UNIT) TABS [id number] Yes Take 1 tablet by mouth daily. Briana Elgin LABOR, MD  Active   dapagliflozin  propanediol (FARXIGA ) 10 MG TABS tablet [id number] Yes Take 1 tablet (10 mg total) by mouth daily before breakfast. [name], MD  Active   dicyclomine  (BENTYL ) 20 MG tablet [id number] Yes TAKE 1 TABLET (20 MG TOTAL) BY MOUTH 3 (THREE) TIMES DAILY AS NEEDED FOR  SPASMS. Craig Alan SAUNDERS, PA-C  Active   diphenoxylate -atropine  (LOMOTIL ) 2.5-0.025 MG tablet [id number] Yes Take 1 tablet by mouth 4 (four) times daily as needed for diarrhea or loose stools. [name], MD  Active   Ensure [facility]) [id number] Yes Take 1 Can by mouth 3 (three) times daily between meals. [name], MD  Active Multiple Informants  famotidine  (PEPCID ) 20 MG tablet [id number] Yes One after supper Darlean Ozell NOVAK, MD   Active   furosemide  (LASIX ) 40 MG tablet [id number]  Take 1 tablet (40 mg total) by mouth every Monday, Wednesday, and Friday.  Patient not taking: Reported on 11/10/2024   Briana Elgin LABOR, MD  Active            Med Note EVERETTE, JESUSA JAYSON Repress Feb 13, 2024  6:01 PM) Daughter brought in bottles Take 1 tablet by mouth 2 times daily  furosemide  (LASIX ) 80 MG tablet [id number] Yes Take 80 mg by mouth 2 (two) times daily. [provider]  Active   gabapentin  (NEURONTIN ) 300 MG capsule [id number] Yes Take 1 capsule (300 mg total) by mouth 2 (two) times daily. [name], MD  Active   hydrocortisone  (ANUSOL -HC) 2.5 % rectal cream [id number] Yes Place 1 Application rectally 2 (two) times daily. Craig Alan SAUNDERS, PA-C  Active   Incontinence Supply Disposable (DEPEND UNDERWEAR SM/MED) MISC [id number] Yes Use as directed four times per day [name], MD  Active Multiple Informants  losartan  (COZAAR ) 50 MG tablet [id number]  Take 50 mg by mouth daily.  Patient not taking: Reported on 11/08/2024   [provider]  Active   meclizine  (ANTIVERT ) 12.5 MG tablet [id number] Yes TAKE 1 TABLET BY MOUTH THREE TIMES A DAY AS NEEDED FOR DIZZINESS John, James W, MD  Active Multiple Informants  montelukast  (SINGULAIR ) 10 MG tablet [id number]  Take 1 tablet (10 mg total) by mouth daily.  Patient not taking: Reported on 11/08/2024   Raulkar, Krutika P, MD  Active   Multiple Vitamin (MULTIVITAMIN WITH MINERALS) TABS tablet [id number] Yes Take 1 tablet by mouth daily. Centrum Silver [provider]  Active Multiple Informants  pantoprazole  (PROTONIX ) 40 MG tablet [id number] Yes Take 1 tablet (40 mg total) by mouth daily. Take 30-60 min before first meal of the day Darlean Ozell NOVAK, MD  Active   polyethylene glycol powder (GLYCOLAX /MIRALAX ) 17 GM/SCOOP powder [id number]  Take 17 g by mouth daily. Dissolve 1 capful (17g) in 4-8 ounces of liquid and take by mouth daily.  Patient not taking: Reported on  11/08/2024   [name], MD  Active   predniSONE  (DELTASONE ) 5 MG tablet [id number] Yes Take 1 tablet (5 mg total) by mouth daily as needed. Lorilee Sven SQUIBB, MD  Active   Turmeric (QC TUMERIC COMPLEX PO) [id number] Yes Take 1,000 mg by mouth daily. [provider]  Active           Recommendation:   Continue Current Plan of Care  Follow Up Plan:   RNCM called [facility]-Spoke with [place], Dr. Cristopher assistant who confirmed patient's stated dosing of Furosemide  as 80 mg twice a day. Telephone follow up appointment date/time:  11/28/24 with assigned CM, Channing Larry, RNCM  Heddy Shutter, RN, MSN, BSN, CCM [place]  [facility], Population Health Case Manager Phone: [phone]

## 2024-11-10 ENCOUNTER — Ambulatory Visit: Payer: Self-pay | Admitting: Internal Medicine

## 2024-11-15 ENCOUNTER — Ambulatory Visit: Admitting: Podiatry

## 2024-11-15 DIAGNOSIS — L539 Erythematous condition, unspecified: Secondary | ICD-10-CM

## 2024-11-15 MED ORDER — FLUCONAZOLE 150 MG PO TABS: 150.0000 mg | ORAL_TABLET | Freq: Once | ORAL | 0 refills | Status: AC

## 2024-11-15 MED ORDER — DOXYCYCLINE HYCLATE 100 MG PO TABS: 100.0000 mg | ORAL_TABLET | Freq: Two times a day (BID) | ORAL | 0 refills | Status: AC

## 2024-11-15 NOTE — Patient Instructions (Addendum)

## 2024-11-15 NOTE — Progress Notes (Signed)
 "  Subjective:  Patient ID: [name], female    DOB: Feb 04, 1936,  MRN: [id number]  Chief Complaint  Patient presents with   Nail Problem    Right hallux nail causing discomfort     89 y.o. female presents with the above complaint.  Patient presents with right hallux medial border ingrown painful to touch is progressing and worse worse with ambulation is with pressure patient would like to have it removed.  Denies any other acute complaints.  Not currently taking antibiotics.  She would like to discuss antibiotics as well.   Review of Systems: Negative except as noted in the HPI. Denies N/V/F/Ch.  Past Medical History:  Diagnosis Date   Allergic rhinitis 10/30/2016   Anemia    Asthma    Breast cancer of upper-outer quadrant of left female breast (HCC) 11/08/2013   ER/PR+ Her2- Left IDC    Chronic renal insufficiency    Chronic rhinitis    Colon polyp    Diastolic dysfunction 07/10/2016   DJD (degenerative joint disease)    Dyspnea    Frozen shoulder    Full dentures    GERD (gastroesophageal reflux disease)    Hearing loss    Hypertension    Hyponatremia    Impaired glucose tolerance 07/18/2014   Memory loss    Morbid obesity (HCC)    Poor circulation    Vertigo    Wears glasses    Current Medications[1]  Tobacco Use History[2]  Allergies[3] Objective:  There were no vitals filed for this visit. There is no height or weight on file to calculate BMI. Constitutional Well developed. Well nourished.  Vascular Dorsalis pedis pulses palpable bilaterally. Posterior tibial pulses palpable bilaterally. Capillary refill normal to all digits.  No cyanosis or clubbing noted. Pedal hair growth normal.  Neurologic Normal speech. Oriented to person, place, and time. Epicritic sensation to light touch grossly present bilaterally.  Dermatologic Painful ingrowing nail at medial nail borders of the hallux nail right. No other open wounds. No skin lesions.   Orthopedic: Normal joint ROM without pain or crepitus bilaterally. No visible deformities. No bony tenderness.   Radiographs: None Assessment:   1. Erythema    Plan:  Patient was evaluated and treated and all questions answered.  Ingrown Nail, right with underlying erythema -Patient elects to proceed with minor surgery to remove ingrown toenail removal today. Consent reviewed and signed by patient. -Ingrown nail excised. See procedure note. -Educated on post-procedure care including soaking. Written instructions provided and reviewed. -Patient to follow up in 2 weeks for nail check if needed - Doxycycline  was dispensed for skin and soft tissue prophylaxis  Procedure: Excision of Ingrown Toenail Location: Right 1st toe medial nail borders. Anesthesia: Lidocaine  1% plain; 1.5 mL and Marcaine  0.5% plain; 1.5 mL, digital block. Skin Prep: Betadine. Dressing: Silvadene; telfa; dry, sterile, compression dressing. Technique: Following skin prep, the toe was exsanguinated and a tourniquet was secured at the base of the toe. The affected nail border was freed, split with a nail splitter, and excised. Chemical matrixectomy was then performed with phenol and irrigated out with alcohol. The tourniquet was then removed and sterile dressing applied. Disposition: Patient tolerated procedure well. Patient to return in 2 weeks for follow-up.   No follow-ups on file.    [1]  Current Outpatient Medications:    doxycycline  (VIBRA -TABS) 100 MG tablet, Take 1 tablet (100 mg total) by mouth 2 (two) times daily., Disp: 20 tablet, Rfl: 0   fluconazole  (DIFLUCAN ) 150  MG tablet, Take 1 tablet (150 mg total) by mouth once for 1 dose., Disp: 1 tablet, Rfl: 0   albuterol  (VENTOLIN  HFA) 108 (90 Base) MCG/ACT inhaler, Inhale 1 puff into the lungs every 6 (six) hours as needed for wheezing or shortness of breath., Disp: 18 g, Rfl: 5   aspirin  EC 81 MG tablet, Take 81 mg by mouth every morning. Swallow whole.,  Disp: , Rfl:    budesonide -formoterol  (SYMBICORT ) 80-4.5 MCG/ACT inhaler, Take 2 puffs first thing in am and then another 2 puffs about 12 hours later., Disp: 1 each, Rfl: 12   Calcium-Magnesium -Zinc-Vit D3 (CALCIUM-MAGNESIUM -ZINC-D3 PO), Take by mouth., Disp: , Rfl:    Cholecalciferol (VITAMIN D3) 20 MCG (800 UNIT) TABS, Take 1 tablet by mouth daily., Disp: , Rfl:    dapagliflozin  propanediol (FARXIGA ) 10 MG TABS tablet, Take 1 tablet (10 mg total) by mouth daily before breakfast., Disp: 90 tablet, Rfl: 3   dicyclomine  (BENTYL ) 20 MG tablet, TAKE 1 TABLET (20 MG TOTAL) BY MOUTH 3 (THREE) TIMES DAILY AS NEEDED FOR SPASMS., Disp: 50 tablet, Rfl: 0   diphenoxylate -atropine  (LOMOTIL ) 2.5-0.025 MG tablet, Take 1 tablet by mouth 4 (four) times daily as needed for diarrhea or loose stools., Disp: 30 tablet, Rfl: 1   Ensure (ENSURE), Take 1 Can by mouth 3 (three) times daily between meals., Disp: 237 mL, Rfl: 12   famotidine  (PEPCID ) 20 MG tablet, One after supper, Disp: 30 tablet, Rfl: 11   furosemide  (LASIX ) 40 MG tablet, Take 1 tablet (40 mg total) by mouth every Monday, Wednesday, and Friday. (Patient not taking: Reported on 11/10/2024), Disp: , Rfl:    furosemide  (LASIX ) 80 MG tablet, Take 80 mg by mouth 2 (two) times daily., Disp: , Rfl:    gabapentin  (NEURONTIN ) 300 MG capsule, Take 1 capsule (300 mg total) by mouth 2 (two) times daily., Disp: 30 capsule, Rfl: 2   hydrocortisone  (ANUSOL -HC) 2.5 % rectal cream, Place 1 Application rectally 2 (two) times daily., Disp: 30 g, Rfl: 1   Incontinence Supply Disposable (DEPEND UNDERWEAR SM/MED) MISC, Use as directed four times per day, Disp: 120 each, Rfl: 5   losartan  (COZAAR ) 50 MG tablet, Take 50 mg by mouth daily. (Patient not taking: Reported on 11/08/2024), Disp: , Rfl:    meclizine  (ANTIVERT ) 12.5 MG tablet, TAKE 1 TABLET BY MOUTH THREE TIMES A DAY AS NEEDED FOR DIZZINESS, Disp: 90 tablet, Rfl: 2   montelukast  (SINGULAIR ) 10 MG tablet, Take 1 tablet (10  mg total) by mouth daily. (Patient not taking: Reported on 11/08/2024), Disp: 90 tablet, Rfl: 2   Multiple Vitamin (MULTIVITAMIN WITH MINERALS) TABS tablet, Take 1 tablet by mouth daily. Centrum Silver, Disp: , Rfl:    pantoprazole  (PROTONIX ) 40 MG tablet, Take 1 tablet (40 mg total) by mouth daily. Take 30-60 min before first meal of the day, Disp: 30 tablet, Rfl: 2   polyethylene glycol powder (GLYCOLAX /MIRALAX ) 17 GM/SCOOP powder, Take 17 g by mouth daily. Dissolve 1 capful (17g) in 4-8 ounces of liquid and take by mouth daily. (Patient not taking: Reported on 11/08/2024), Disp: 238 g, Rfl: 3   predniSONE  (DELTASONE ) 5 MG tablet, Take 1 tablet (5 mg total) by mouth daily as needed., Disp: 90 tablet, Rfl: 3   Turmeric (QC TUMERIC COMPLEX PO), Take 1,000 mg by mouth daily., Disp: , Rfl:  [2]  Social History Tobacco Use  Smoking Status Never   Passive exposure: Never  Smokeless Tobacco Never  [3]  Allergies Allergen Reactions  Hydrocodone  Itching   Hydrocodone -Acetaminophen  Other (See Comments)   Lasix  [Furosemide ] Other (See Comments)    Dizziness.    Tizanidine  Other (See Comments)    Dizzy and fall   Tizanidine  Hcl Other (See Comments)   Iron  Hives and Other (See Comments)     bad constipation    "

## 2024-11-17 ENCOUNTER — Telehealth: Payer: Self-pay | Admitting: *Deleted

## 2024-11-17 NOTE — Telephone Encounter (Signed)
 LVM to call back x1   Copied from CRM #[id number]. Topic: Clinical - Medication Question >> Nov 16, 2024 10:37 AM Dedra B wrote: Reason for CRM: Patient is concerned about the side effects of budesonide -formoterol  (SYMBICORT ) 80-4.5 MCG/ACT inhaler and famotidine  (PEPCID ) 20 MG tablet and wants to know if they can be substituted for something else. Please call patient.

## 2024-11-24 NOTE — Telephone Encounter (Signed)
 2nd attempt lvmtcb  AVS   Patient Instructions  Stop wixella    Symbicort  80 Take 2 puffs first thing in am and then another 2 puffs about 12 hours later.    Use your albuterol  as a rescue medication to be used if you can't catch your breath by resting, slowing your pace,  or doing a relaxed purse lip breathing pattern.  - The less you use it, the better it will work when you need it. - Ok to use up to 2 puffs  every 4 hours if you must but call for  appointment if use goes up over your usual need - Don't leave home without it !!  (think of it like a spare tire or starter fluid for your car)    Also  Ok to try albuterol  15 min before an activity (on alternating days)  that you know would usually make you short of breath and see if it makes any difference and if makes none then don't take albuterol  after activity unless you can't catch your breath as this means it's the resting that helps, not the albuterol .       Pantoprazole  (protonix ) 40 mg   Take  30-60 min before first meal of the day and Pepcid  (famotidine )  20 mg after supper until return to office - this is the best way to tell whether stomach acid is contributing to your problem.

## 2024-11-27 NOTE — Telephone Encounter (Signed)
 My chart message sent to pt.

## 2024-11-28 ENCOUNTER — Telehealth

## 2024-11-29 ENCOUNTER — Telehealth: Payer: Self-pay

## 2024-12-08 ENCOUNTER — Other Ambulatory Visit

## 2025-01-30 ENCOUNTER — Ambulatory Visit: Admitting: Physical Medicine and Rehabilitation

## 2025-02-06 ENCOUNTER — Encounter: Admitting: Physical Medicine and Rehabilitation

## 2025-02-26 ENCOUNTER — Encounter: Admitting: Internal Medicine

## 2025-07-05 ENCOUNTER — Ambulatory Visit
